# Patient Record
Sex: Male | Born: 1940 | Race: White | Hispanic: No | Marital: Married | Smoking: Former smoker
Health system: Southern US, Community
[De-identification: ages and names within clinical notes are randomized; demographics above are authoritative.]

## PROBLEM LIST (undated history)

## (undated) DIAGNOSIS — G4730 Sleep apnea, unspecified: Secondary | ICD-10-CM

## (undated) DIAGNOSIS — Z87442 Personal history of urinary calculi: Secondary | ICD-10-CM

## (undated) DIAGNOSIS — N400 Enlarged prostate without lower urinary tract symptoms: Secondary | ICD-10-CM

## (undated) DIAGNOSIS — I714 Abdominal aortic aneurysm, without rupture, unspecified: Secondary | ICD-10-CM

## (undated) DIAGNOSIS — I10 Essential (primary) hypertension: Secondary | ICD-10-CM

## (undated) DIAGNOSIS — K7460 Unspecified cirrhosis of liver: Secondary | ICD-10-CM

## (undated) DIAGNOSIS — C4491 Basal cell carcinoma of skin, unspecified: Secondary | ICD-10-CM

## (undated) DIAGNOSIS — Z9981 Dependence on supplemental oxygen: Secondary | ICD-10-CM

## (undated) DIAGNOSIS — Z860100 Personal history of colon polyps, unspecified: Secondary | ICD-10-CM

## (undated) DIAGNOSIS — I6529 Occlusion and stenosis of unspecified carotid artery: Secondary | ICD-10-CM

## (undated) DIAGNOSIS — M4800 Spinal stenosis, site unspecified: Secondary | ICD-10-CM

## (undated) DIAGNOSIS — M519 Unspecified thoracic, thoracolumbar and lumbosacral intervertebral disc disorder: Secondary | ICD-10-CM

## (undated) DIAGNOSIS — E785 Hyperlipidemia, unspecified: Secondary | ICD-10-CM

## (undated) DIAGNOSIS — I4819 Other persistent atrial fibrillation: Secondary | ICD-10-CM

## (undated) DIAGNOSIS — Z8719 Personal history of other diseases of the digestive system: Secondary | ICD-10-CM

## (undated) DIAGNOSIS — M109 Gout, unspecified: Secondary | ICD-10-CM

## (undated) DIAGNOSIS — K219 Gastro-esophageal reflux disease without esophagitis: Secondary | ICD-10-CM

## (undated) DIAGNOSIS — I2510 Atherosclerotic heart disease of native coronary artery without angina pectoris: Secondary | ICD-10-CM

## (undated) DIAGNOSIS — M1990 Unspecified osteoarthritis, unspecified site: Secondary | ICD-10-CM

## (undated) HISTORY — PX: PARS PLANA VITRECTOMY W/ REPAIR OF MACULAR HOLE: SHX2170

## (undated) HISTORY — PX: FRACTURE SURGERY: SHX138

## (undated) HISTORY — PX: LITHOTRIPSY: SUR834

## (undated) HISTORY — PX: TONSILLECTOMY: SUR1361

## (undated) HISTORY — PX: BACK SURGERY: SHX140

## (undated) HISTORY — DX: Abdominal aortic aneurysm, without rupture, unspecified: I71.40

## (undated) HISTORY — PX: COLONOSCOPY: SHX174

## (undated) HISTORY — PX: WRIST SURGERY: SHX841

## (undated) HISTORY — PX: OTHER SURGICAL HISTORY: SHX169

## (undated) HISTORY — PX: EYE SURGERY: SHX253

## (undated) HISTORY — PX: RHINOPLASTY: SUR1284

## (undated) SURGERY — Surgical Case
Anesthesia: *Unknown

---

## 1998-08-10 ENCOUNTER — Encounter: Payer: Self-pay | Admitting: Urology

## 1998-08-10 ENCOUNTER — Ambulatory Visit (HOSPITAL_COMMUNITY): Admission: RE | Admit: 1998-08-10 | Discharge: 1998-08-10 | Payer: Self-pay | Admitting: Urology

## 2001-08-12 ENCOUNTER — Other Ambulatory Visit: Admission: RE | Admit: 2001-08-12 | Discharge: 2001-08-12 | Payer: Self-pay | Admitting: Dermatology

## 2004-07-31 ENCOUNTER — Ambulatory Visit (HOSPITAL_COMMUNITY): Admission: RE | Admit: 2004-07-31 | Discharge: 2004-07-31 | Payer: Self-pay | Admitting: Internal Medicine

## 2005-08-14 ENCOUNTER — Ambulatory Visit (HOSPITAL_COMMUNITY): Admission: RE | Admit: 2005-08-14 | Discharge: 2005-08-14 | Payer: Self-pay | Admitting: Internal Medicine

## 2007-10-11 ENCOUNTER — Ambulatory Visit (HOSPITAL_COMMUNITY): Admission: RE | Admit: 2007-10-11 | Discharge: 2007-10-12 | Payer: Self-pay | Admitting: Neurosurgery

## 2007-10-26 ENCOUNTER — Encounter (HOSPITAL_COMMUNITY): Admission: RE | Admit: 2007-10-26 | Discharge: 2007-11-25 | Payer: Self-pay | Admitting: Neurosurgery

## 2008-03-07 ENCOUNTER — Encounter: Admission: RE | Admit: 2008-03-07 | Discharge: 2008-03-07 | Payer: Self-pay | Admitting: Neurosurgery

## 2009-06-02 HISTORY — PX: JOINT REPLACEMENT: SHX530

## 2009-06-18 ENCOUNTER — Ambulatory Visit (HOSPITAL_COMMUNITY): Admission: RE | Admit: 2009-06-18 | Discharge: 2009-06-18 | Payer: Self-pay | Admitting: Internal Medicine

## 2009-09-10 ENCOUNTER — Inpatient Hospital Stay (HOSPITAL_COMMUNITY): Admission: RE | Admit: 2009-09-10 | Discharge: 2009-09-14 | Payer: Self-pay | Admitting: Orthopedic Surgery

## 2009-09-10 DIAGNOSIS — Z96652 Presence of left artificial knee joint: Secondary | ICD-10-CM | POA: Insufficient documentation

## 2009-10-03 ENCOUNTER — Encounter (HOSPITAL_COMMUNITY): Admission: RE | Admit: 2009-10-03 | Discharge: 2009-11-02 | Payer: Self-pay | Admitting: Orthopedic Surgery

## 2009-11-06 ENCOUNTER — Encounter (HOSPITAL_COMMUNITY)
Admission: RE | Admit: 2009-11-06 | Discharge: 2009-12-06 | Payer: Self-pay | Source: Home / Self Care | Admitting: Orthopedic Surgery

## 2010-06-23 ENCOUNTER — Encounter: Payer: Self-pay | Admitting: Orthopaedic Surgery

## 2010-07-01 ENCOUNTER — Ambulatory Visit (HOSPITAL_COMMUNITY)
Admission: RE | Admit: 2010-07-01 | Discharge: 2010-07-01 | Payer: Self-pay | Source: Home / Self Care | Attending: Internal Medicine | Admitting: Internal Medicine

## 2010-08-20 LAB — PROTIME-INR
INR: 1.43 (ref 0.00–1.49)
Prothrombin Time: 17.3 seconds — ABNORMAL HIGH (ref 11.6–15.2)

## 2010-08-21 LAB — BASIC METABOLIC PANEL
Chloride: 99 mEq/L (ref 96–112)
Creatinine, Ser: 1.25 mg/dL (ref 0.4–1.5)
GFR calc Af Amer: >60 mL/min (ref >60)
GFR calc non Af Amer: >60 mL/min (ref >60)
Glucose, Bld: 121 mg/dL — ABNORMAL HIGH (ref 70–99)
Glucose, Bld: 162 mg/dL — ABNORMAL HIGH (ref 70–99)
Sodium: 138 mEq/L (ref 135–145)

## 2010-08-21 LAB — CBC
HCT: 30.8 % — ABNORMAL LOW (ref 39.0–52.0)
HCT: 35.2 % — ABNORMAL LOW (ref 39.0–52.0)
Hemoglobin: 11.1 g/dL — ABNORMAL LOW (ref 13.0–17.0)
MCHC: 33.7 g/dL (ref 30.0–36.0)
MCHC: 34.3 g/dL (ref 30.0–36.0)
MCV: 94.4 fL (ref 78.0–100.0)
MCV: 94.9 fL (ref 78.0–100.0)
MCV: 95.3 fL (ref 78.0–100.0)
Platelets: 205 10*3/uL (ref 150–400)
Platelets: 215 10*3/uL (ref 150–400)
Platelets: 230 10*3/uL (ref 150–400)
RBC: 3.48 MIL/uL — ABNORMAL LOW (ref 4.22–5.81)
RDW: 13.4 % (ref 11.5–15.5)
RDW: 13.7 % (ref 11.5–15.5)
WBC: 11.2 10*3/uL — ABNORMAL HIGH (ref 4.0–10.5)
WBC: 13.7 10*3/uL — ABNORMAL HIGH (ref 4.0–10.5)

## 2010-08-21 LAB — PROTIME-INR
INR: 0.96 (ref 0.00–1.49)
INR: 0.99 (ref 0.00–1.49)
INR: 1.09 (ref 0.00–1.49)
INR: 1.27 (ref 0.00–1.49)
Prothrombin Time: 12.7 seconds (ref 11.6–15.2)
Prothrombin Time: 13 seconds (ref 11.6–15.2)
Prothrombin Time: 14 seconds (ref 11.6–15.2)

## 2010-08-21 LAB — COMPREHENSIVE METABOLIC PANEL
Albumin: 4 g/dL (ref 3.5–5.2)
Alkaline Phosphatase: 65 U/L (ref 39–117)
CO2: 30 mEq/L (ref 19–32)
Calcium: 8.9 mg/dL (ref 8.4–10.5)
Creatinine, Ser: 1.16 mg/dL (ref 0.4–1.5)
GFR calc non Af Amer: >60 mL/min (ref >60)
Sodium: 140 mEq/L (ref 135–145)
Total Bilirubin: 0.5 mg/dL (ref 0.3–1.2)
Total Protein: 6.7 g/dL (ref 6.0–8.3)

## 2010-08-21 LAB — URINALYSIS, ROUTINE W REFLEX MICROSCOPIC
Hgb urine dipstick: NEGATIVE
Specific Gravity, Urine: 1.014 (ref 1.005–1.030)

## 2010-08-21 LAB — GLUCOSE, CAPILLARY: Glucose-Capillary: 104 mg/dL — ABNORMAL HIGH (ref 70–99)

## 2010-08-21 LAB — TYPE AND SCREEN: ABO/RH(D): A POS

## 2010-08-21 LAB — ABO/RH: ABO/RH(D): A POS

## 2010-08-21 LAB — APTT: aPTT: 27 seconds (ref 24–37)

## 2010-10-15 NOTE — Op Note (Signed)
NAMEHENDERSON, FRAMPTON NO.:  [id number]   MEDICAL RECORD NO.:  [id number]          PATIENT TYPE:  OIB   LOCATION:  3534                         FACILITY:  [facility]   PHYSICIAN:  Donalee Citrin, [name]        DATE OF BIRTH:  Aug 30, 1940   DATE OF PROCEDURE:  10/11/2007  DATE OF DISCHARGE:                               OPERATIVE REPORT   PREOPERATIVE DIAGNOSES:  Large ruptured disk L3-L4 with spinal stenosis  and right-sided L4 radiculopathy.   PROCEDURE:  Lumbar laminectomy and microdiskectomy L3-L4 on the right  with microscopic dissection of the right L4 nerve root microscopic  diskectomy.   SURGEON:  Donalee Citrin, MD   ASSISTANT:  Kathaleen Maser. Pool, MD   ANESTHESIA:  General.   HISTORY OF PRESENT ILLNESS:  The patient is a very pleasant 70 year old  gentleman who has had progressive worsening back, predominantly right  leg pain radiating down through his hip down the back of his leg to the  front of his shin with numbness and tingling in the same distribution.  The patient's MRI scan showed a very large disk herniation, central and  rightward, causing severe spinal stenosis and compression to the right  L4 nerve root.  The patient failed all forms of  conservative treatment  and was recommended laminectomy and microdiskectomy.  The risks and  benefits of the operation were explained to the patient, and he  understands and agreed to proceed forward.   The patient was brought to the OR and induced under anesthesia,  positioned prone on the Wilson frame.  Back was prepped and prepped in  the usual sterile fashion.  The fascia localized the appropriate level.  After infiltration with 5 mL of lidocaine with epinephrine, a midline  incision was made, and Bovie electrocautery was used to take down the  subperiosteal.  Dissection was carried down the lamina at L3-L4 on the  right.  Intraoperative x-ray confirmed the position at the appropriate  level.  Then, using a high-speed  drill, the inferior aspect was the L3  medial facet complex and the superior aspect of L4 was drilled down.  The undersurface of the lamina was bitten away with a 2- and 3-mm  Kerrison punch exposing the ligamentum flavum, which was removed in a  piecemeal fashion exposing the thecal sac.  At this point, the operative  microscope was draped, brought into the field and under microscopic  illumination, thecal sac was dissected off the lateral ligament and  careful and meticulous underbiting of the medial facet complex allowed  me to gain access to the lateral compartment and the disk space in the  proximal L4 nerve root.  The L4 nerve root was densely adherent to the  large disk herniation and compressing it from the ventral side.  This  was attempted to be teased away with a 4 Penfield and blunt nerve hook  over the plane was difficult to develop, so further lateral access to  the disk space was achieved.  A small annulotomy was made with 11 blade  scalpel and using  a nerve hook working on the annular defect, working  underneath the nerve root, a large fragment of the disk was immediately  expressed.  This allowed more further mobilization of the nerve root.  Working from the pedicle superiorly, the L4 nerve root was dissected off  this dense adhesion membrane and several more fragments of the disk were  immediately removed.  Using Epstein curette and pituitary rongeurs, disk  space was directly cleaned out and several more fragments were removed  from the central compartment as well as both inferiorly through the disk  space at the level of the L4 pedicle.  At the end of the diskectomy,  there was no further stenosis at the L4 nerve root or thecal sac  centrally.  It was explored with a coronary dilator and hockey stock and  noted to be widely patent.  The wound was then copiously irrigated.  Meticulous hemostasis was maintained.  Gelfoam was laid on top of the  dura  muscle.  The fascia  was approximated in layers with interrupted Vicryl  and the skin was closed with running 4-0 subcuticular.  Benzoin and  Steri-Strips applied.  The patient went to the recovery room in stable  condition.  At the end of the case, sponge and instrument count was  correct.           ______________________________  Donalee Citrin, [name]     GC/MEDQ  D:  10/11/2007  T:  10/12/2007  Job:  [id number]

## 2011-04-07 DIAGNOSIS — N529 Male erectile dysfunction, unspecified: Secondary | ICD-10-CM | POA: Insufficient documentation

## 2011-04-07 DIAGNOSIS — E291 Testicular hypofunction: Secondary | ICD-10-CM | POA: Insufficient documentation

## 2011-07-01 ENCOUNTER — Other Ambulatory Visit (HOSPITAL_COMMUNITY): Payer: Self-pay | Admitting: Internal Medicine

## 2011-07-04 ENCOUNTER — Ambulatory Visit (HOSPITAL_COMMUNITY)
Admission: RE | Admit: 2011-07-04 | Discharge: 2011-07-04 | Disposition: A | Discharge status: Home / Self Care | Payer: Medicare Other | Source: Ambulatory Visit | Attending: Internal Medicine | Admitting: Internal Medicine

## 2011-07-04 DIAGNOSIS — I6529 Occlusion and stenosis of unspecified carotid artery: Secondary | ICD-10-CM | POA: Insufficient documentation

## 2011-07-04 DIAGNOSIS — I779 Disorder of arteries and arterioles, unspecified: Secondary | ICD-10-CM | POA: Diagnosis not present

## 2011-07-04 DIAGNOSIS — I2510 Atherosclerotic heart disease of native coronary artery without angina pectoris: Secondary | ICD-10-CM | POA: Diagnosis not present

## 2011-07-04 DIAGNOSIS — I259 Chronic ischemic heart disease, unspecified: Secondary | ICD-10-CM | POA: Diagnosis not present

## 2011-07-04 DIAGNOSIS — I10 Essential (primary) hypertension: Secondary | ICD-10-CM | POA: Diagnosis not present

## 2011-07-11 ENCOUNTER — Ambulatory Visit (HOSPITAL_COMMUNITY)
Admission: RE | Admit: 2011-07-11 | Discharge: 2011-07-11 | Disposition: A | Discharge status: Home / Self Care | Payer: Medicare Other | Source: Ambulatory Visit | Attending: Internal Medicine | Admitting: Internal Medicine

## 2011-07-11 ENCOUNTER — Other Ambulatory Visit (HOSPITAL_COMMUNITY): Payer: Self-pay | Admitting: Internal Medicine

## 2011-07-11 DIAGNOSIS — M1610 Unilateral primary osteoarthritis, unspecified hip: Secondary | ICD-10-CM | POA: Diagnosis not present

## 2011-07-11 DIAGNOSIS — I10 Essential (primary) hypertension: Secondary | ICD-10-CM | POA: Diagnosis not present

## 2011-07-11 DIAGNOSIS — M25559 Pain in unspecified hip: Secondary | ICD-10-CM | POA: Diagnosis not present

## 2011-07-11 DIAGNOSIS — R52 Pain, unspecified: Secondary | ICD-10-CM

## 2011-07-11 DIAGNOSIS — M169 Osteoarthritis of hip, unspecified: Secondary | ICD-10-CM | POA: Insufficient documentation

## 2011-09-11 DIAGNOSIS — M169 Osteoarthritis of hip, unspecified: Secondary | ICD-10-CM | POA: Diagnosis not present

## 2011-09-11 DIAGNOSIS — M1710 Unilateral primary osteoarthritis, unspecified knee: Secondary | ICD-10-CM | POA: Diagnosis not present

## 2011-09-21 ENCOUNTER — Other Ambulatory Visit: Payer: Self-pay | Admitting: Orthopedic Surgery

## 2011-09-21 MED ORDER — BUPIVACAINE LIPOSOME 1.3 % IJ SUSP: 20.0000 mL | Freq: Once | INTRAMUSCULAR | Status: DC

## 2011-09-21 MED ORDER — DEXAMETHASONE SODIUM PHOSPHATE 10 MG/ML IJ SOLN: 10.0000 mg | Freq: Once | INTRAMUSCULAR | Status: DC

## 2011-10-15 DIAGNOSIS — I10 Essential (primary) hypertension: Secondary | ICD-10-CM | POA: Diagnosis not present

## 2011-10-15 DIAGNOSIS — M1990 Unspecified osteoarthritis, unspecified site: Secondary | ICD-10-CM | POA: Diagnosis not present

## 2011-11-17 ENCOUNTER — Encounter (HOSPITAL_COMMUNITY): Payer: Self-pay | Admitting: Pharmacy Technician

## 2011-11-26 NOTE — H&P (Signed)
Caleb Wolf DOB: 03/30/1941  Chief Complaint: right hip pain  History of Present Illness The patient is a 71 year old male who comes in today for a preoperative History and Physical. The patient is scheduled for a right total hip arthroplasty to be performed by Dr. Gus Rankin. Aluisio, MD at [facility] on Monday December 08, 2011 . The right hip is the thing that is bothering him the most now. Significant pain in the groin, radiating down the anterior thigh. He is not having lower extremity weakness or paresthesia. It hurts at all times. Worse with activity, but also recurring at rest. He has advanced endstage arthritis of the hip. He is having worsening pain and worsening dysfunction. At this point the most predictable means of getting him better is a total hip arthroplasty.    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 10/31/2010 Sprain/strain, knee/leg NOS (844.9). 11/15/2010 Hypertension Hypercholesterolemia Kidney Stone Cataract Heart Disease Sleep Apnea. not diagnosed Fracture Of Wrist  Allergies Codeine/Codeine Derivatives   Family History Congestive Heart Failure. mother and father Father. deceased age 86 due to MI Mother. deceased age 68 due to MI   Social History Marital status. married Number of flights of stairs before winded. 4-5 Pain Contract. no Exercise. Exercises weekly; does running / walking Illicit drug use. no Living situation. live with spouse Previously in rehab. no Tobacco / smoke exposure. no Tobacco use. former smoker; smoke(d) 1 1/2 pack(s) per day; uses less than half 1/2 can(s) smokeless per week Drug/Alcohol Rehab (Currently). no Alcohol use. current drinker; drinks beer, wine and hard liquor; only occasionally per week Children. 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 living will, healthcare POA   Medication History Lisinopril (40MG  Tablet, Oral) Active. Metoprolol Tartrate (100MG  Tablet, Oral) Active. Finasteride (5MG  Tablet, Oral) Active. Hydrochlorothiazide (25MG  Tablet, Oral) Active. CloNIDine HCl (0.1MG  Tablet, Oral) Active. Simvastatin (10MG  Tablet, Oral) Active. Testim (50MG /5GM Gel, Transdermal) Active. Verapamil HCl ER (180MG  Tablet ER, Oral) Active. Aspirin EC (81MG  Tablet DR, Oral daily) Active.   Past Surgical History Straighten Nasal Septum Total Knee Replacement. left Other Surgery. LEFT WRIST-1995 Spinal Decompression. lower back Tonsillectomy    Review of Systems General:Not Present- Chills, Fever, Night Sweats, Fatigue, Weight Gain, Weight Loss and Memory Loss. Skin:Not Present- Hives, Itching, Rash, Eczema and Lesions. HEENT:Not Present- Tinnitus, Headache, Double Vision, Visual Loss, Hearing Loss and Dentures. Respiratory:Not Present- Shortness of breath with exertion, Shortness of breath at rest, Allergies, Coughing up blood and Chronic Cough. Cardiovascular:Not Present- Chest Pain, Racing/skipping heartbeats, Difficulty Breathing Lying Down, Murmur, Swelling and Palpitations. Gastrointestinal:Not Present- Bloody Stool, Heartburn, Abdominal Pain, Vomiting, Nausea, Constipation, Diarrhea, Difficulty Swallowing, Jaundice and Loss of appetitie. Male Genitourinary:Not Present- Urinary frequency, Blood in Urine, Weak urinary stream, Discharge, Flank Pain, Incontinence, Painful Urination, Urgency, Urinary Retention and Urinating at Night. Musculoskeletal:Present- Joint Pain. Not Present- Muscle Weakness, Muscle Pain, Joint Swelling, Back Pain, Morning Stiffness and Spasms. Neurological:Not Present- Tremor, Dizziness, Blackout spells, Paralysis, Difficulty with balance and Weakness. Psychiatric:Not Present- Insomnia.   Vitals Weight: 280 lb Height: 70  in Body Surface Area: 2.5 m Body Mass Index: 40.18 kg/m Pulse: 72 (Regular) Resp.: 16 (Unlabored) BP: 118/72 (Sitting, Left Arm, Standard)    Physical Exam General Mental Status - Alert, cooperative and good historian. General Appearance- pleasant. Not in acute distress. Orientation- Oriented X3. Build & Nutrition- Well nourished and Well  developed. Head and Neck Head- normocephalic, atraumatic . Neck Global Assessment- supple. no bruit auscultated on the right and no bruit auscultated on the left. Eye Pupil- Bilateral- Regular and Round. Motion- Bilateral- EOMI. Chest and Lung Exam Auscultation: Breath sounds:- clear at anterior chest wall and - clear at posterior chest wall. Adventitious sounds:- No Adventitious sounds. Cardiovascular Auscultation:Rhythm- Regular rate and rhythm. Heart Sounds- S1 WNL and S2 WNL. Murmurs & Other Heart Sounds:Auscultation of the heart reveals - No Murmurs. Abdomen Palpation/Percussion:Tenderness- Abdomen is non-tender to palpation. Rigidity (guarding)- Abdomen is soft. Auscultation:Auscultation of the abdomen reveals - Bowel sounds normal. Male Genitourinary Not done, not pertinent to present illness Peripheral Vascular Upper Extremity: Palpation:- Pulses bilaterally normal. Lower Extremity: Palpation:- Pulses bilaterally normal. Neurologic Examination of related systems reveals - normal muscle strength and tone in all extremities. Neurologic evaluation reveals - normal sensation and upper and lower extremity deep tendon reflexes intact bilaterally . MusculoskeletalThe left hip has normal motion with no discomfort. Right hip flexion 90,. no internal rotation, about 10 external rotation, and 10 abduction. He has significant antalgic gait pattern on the left. His right knee looks great. Range 0 to 130. No swelling, tenderness, or instability.    RADIOGRAPHS: Radiographs taken today AP and  lateral of the left knee shows his prosthesis to be in excellent position with no periprosthetic abnormalities. AP pelvis and lateral of the right hip shows advanced endstage arthritis of the hip. He is bone on bone with large osteophyte formation.  Assessment & Plan Osteoarthritis, Hip (715.35) Right total hip arthroplasty     Dimitri Ped, PA-C

## 2011-12-01 ENCOUNTER — Encounter (HOSPITAL_COMMUNITY)
Admission: RE | Admit: 2011-12-01 | Discharge: 2011-12-01 | Disposition: A | Discharge status: Home / Self Care | Payer: Medicare Other | Source: Ambulatory Visit | Attending: Orthopedic Surgery | Admitting: Orthopedic Surgery

## 2011-12-01 ENCOUNTER — Ambulatory Visit (HOSPITAL_COMMUNITY)
Admission: RE | Admit: 2011-12-01 | Discharge: 2011-12-01 | Disposition: A | Discharge status: Home / Self Care | Payer: Medicare Other | Source: Ambulatory Visit | Attending: Orthopedic Surgery | Admitting: Orthopedic Surgery

## 2011-12-01 ENCOUNTER — Encounter (HOSPITAL_COMMUNITY): Payer: Self-pay

## 2011-12-01 DIAGNOSIS — I517 Cardiomegaly: Secondary | ICD-10-CM | POA: Diagnosis not present

## 2011-12-01 DIAGNOSIS — Z01812 Encounter for preprocedural laboratory examination: Secondary | ICD-10-CM | POA: Diagnosis not present

## 2011-12-01 DIAGNOSIS — I498 Other specified cardiac arrhythmias: Secondary | ICD-10-CM | POA: Diagnosis not present

## 2011-12-01 DIAGNOSIS — Z01818 Encounter for other preprocedural examination: Secondary | ICD-10-CM | POA: Insufficient documentation

## 2011-12-01 DIAGNOSIS — M25559 Pain in unspecified hip: Secondary | ICD-10-CM | POA: Diagnosis not present

## 2011-12-01 DIAGNOSIS — M1610 Unilateral primary osteoarthritis, unspecified hip: Secondary | ICD-10-CM | POA: Insufficient documentation

## 2011-12-01 DIAGNOSIS — Z87891 Personal history of nicotine dependence: Secondary | ICD-10-CM | POA: Insufficient documentation

## 2011-12-01 DIAGNOSIS — M25859 Other specified joint disorders, unspecified hip: Secondary | ICD-10-CM | POA: Diagnosis not present

## 2011-12-01 DIAGNOSIS — M169 Osteoarthritis of hip, unspecified: Secondary | ICD-10-CM | POA: Insufficient documentation

## 2011-12-01 DIAGNOSIS — Z01810 Encounter for preprocedural cardiovascular examination: Secondary | ICD-10-CM | POA: Insufficient documentation

## 2011-12-01 HISTORY — DX: Occlusion and stenosis of unspecified carotid artery: I65.29

## 2011-12-01 HISTORY — DX: Unspecified thoracic, thoracolumbar and lumbosacral intervertebral disc disorder: M51.9

## 2011-12-01 HISTORY — DX: Sleep apnea, unspecified: G47.30

## 2011-12-01 HISTORY — DX: Gastro-esophageal reflux disease without esophagitis: K21.9

## 2011-12-01 HISTORY — DX: Unspecified osteoarthritis, unspecified site: M19.90

## 2011-12-01 HISTORY — DX: Atherosclerotic heart disease of native coronary artery without angina pectoris: I25.10

## 2011-12-01 HISTORY — DX: Hyperlipidemia, unspecified: E78.5

## 2011-12-01 LAB — COMPREHENSIVE METABOLIC PANEL
BUN: 21 mg/dL (ref 6–23)
CO2: 28 mEq/L (ref 19–32)
Chloride: 104 mEq/L (ref 96–112)
Creatinine, Ser: 1.13 mg/dL (ref 0.50–1.35)
GFR calc Af Amer: 75 mL/min — ABNORMAL LOW (ref >90)
GFR calc non Af Amer: 64 mL/min — ABNORMAL LOW (ref >90)
Glucose, Bld: 99 mg/dL (ref 70–99)
Total Bilirubin: 0.3 mg/dL (ref 0.3–1.2)

## 2011-12-01 LAB — URINALYSIS, ROUTINE W REFLEX MICROSCOPIC
Bilirubin Urine: NEGATIVE
Ketones, ur: NEGATIVE mg/dL
Nitrite: NEGATIVE
Urobilinogen, UA: 0.2 mg/dL (ref 0.0–1.0)

## 2011-12-01 LAB — CBC
HCT: 42.1 % (ref 39.0–52.0)
MCV: 93.8 fL (ref 78.0–100.0)
RBC: 4.49 MIL/uL (ref 4.22–5.81)
RDW: 13.6 % (ref 11.5–15.5)
WBC: 8 10*3/uL (ref 4.0–10.5)

## 2011-12-01 LAB — PROTIME-INR
INR: 0.99 (ref 0.00–1.49)
Prothrombin Time: 13.3 seconds (ref 11.6–15.2)

## 2011-12-01 LAB — URINE MICROSCOPIC-ADD ON

## 2011-12-01 NOTE — Patient Instructions (Signed)
20 DEMETRIS CAPELL  12/01/2011   Your procedure is scheduled on:  12/08/11   Monday  Surgery [id number]  Report to [facility] at    1125   AM.  Call this number if you have problems the morning of surgery: [phone]     Or PST   [id number]  [facility]   Remember:   Do not eat food:After Midnight. Sunday NIGHT  May have clear liquids: until 0530 AM   Monday MORNING THEN NONE  Clear liquids include soda, tea, black coffee, apple or grape juice, broth.  Take these medicines the morning of surgery with A SIP OF WATER: CLONIDINE, TOPROL XL, VERAPAMIL   Do not wear jewelry, make-up or nail polish.  Do not wear lotions, powders, or perfumes. You may wear deodorant.  Do not shave 48 hours prior to surgery.  Do not bring valuables to the hospital.  Contacts, dentures or bridgework may not be worn into surgery.  Leave suitcase in the car. After surgery it may be brought to your room.  For patients admitted to the hospital, checkout time is 11:00 AM the day of discharge.   Patients discharged the day of surgery will not be allowed to drive home.  Name and phone number of your driver:  wife                                                                    Special Instructions: CHG Shower Use Special Wash: 1/2 bottle night before surgery and 1/2 bottle morning of surgery. REGULAR SOAP FACE AND PRIVATES               Please read over the following fact sheets that you were given: MRSA Information

## 2011-12-01 NOTE — Pre-Procedure Instructions (Signed)
Notified Lundi at [facility] of abnormal urine and micro for provider to review- states will let provider know

## 2011-12-01 NOTE — Pre-Procedure Instructions (Signed)
Left vm pt cell number requesting call back for verification of pos staph. Home number does not have answering machine

## 2011-12-01 NOTE — Progress Notes (Signed)
12/01/11 1020  OBSTRUCTIVE SLEEP APNEA  Have you ever been diagnosed with sleep apnea through a sleep study? No  Do you snore loudly (loud enough to be heard through closed doors)?  1  Do you often feel tired, fatigued, or sleepy during the daytime? 1  Has anyone observed you stop breathing during your sleep? 1  Do you have, or are you being treated for high blood pressure? 1  BMI more than 35 kg/m2? 1  Age over 69 years old? 1  Neck circumference greater than 40 cm/18 inches? 1  Gender: 1  Obstructive Sleep Apnea Score 8   Score 4 or greater  Updated health history;Results sent to PCP

## 2011-12-01 NOTE — Pre-Procedure Instructions (Signed)
EKG reviewed by Dr Leta Jungling- OK

## 2011-12-02 DIAGNOSIS — E785 Hyperlipidemia, unspecified: Secondary | ICD-10-CM | POA: Diagnosis not present

## 2011-12-03 NOTE — Pre-Procedure Instructions (Signed)
Received fax that no action needed re abnormal labs per Dr Lequita Halt

## 2011-12-08 ENCOUNTER — Inpatient Hospital Stay (HOSPITAL_COMMUNITY): Payer: Medicare Other

## 2011-12-08 ENCOUNTER — Encounter (HOSPITAL_COMMUNITY): Payer: Self-pay | Admitting: Orthopedic Surgery

## 2011-12-08 ENCOUNTER — Encounter (HOSPITAL_COMMUNITY): Payer: Self-pay | Admitting: Anesthesiology

## 2011-12-08 ENCOUNTER — Ambulatory Visit (HOSPITAL_COMMUNITY): Payer: Medicare Other | Admitting: Anesthesiology

## 2011-12-08 ENCOUNTER — Inpatient Hospital Stay (HOSPITAL_COMMUNITY)
Admission: RE | Admit: 2011-12-08 | Discharge: 2011-12-10 | DRG: 470 | Disposition: A | Discharge status: Home Health | Payer: Medicare Other | Source: Ambulatory Visit | Attending: Orthopedic Surgery | Admitting: Orthopedic Surgery

## 2011-12-08 ENCOUNTER — Encounter (HOSPITAL_COMMUNITY)
Admission: RE | Disposition: A | Discharge status: Home Health | Payer: Self-pay | Source: Ambulatory Visit | Attending: Orthopedic Surgery

## 2011-12-08 ENCOUNTER — Encounter (HOSPITAL_COMMUNITY): Payer: Self-pay | Admitting: *Deleted

## 2011-12-08 DIAGNOSIS — I2510 Atherosclerotic heart disease of native coronary artery without angina pectoris: Secondary | ICD-10-CM | POA: Diagnosis present

## 2011-12-08 DIAGNOSIS — I739 Peripheral vascular disease, unspecified: Secondary | ICD-10-CM | POA: Diagnosis not present

## 2011-12-08 DIAGNOSIS — I10 Essential (primary) hypertension: Secondary | ICD-10-CM | POA: Diagnosis present

## 2011-12-08 DIAGNOSIS — Z96649 Presence of unspecified artificial hip joint: Secondary | ICD-10-CM

## 2011-12-08 DIAGNOSIS — Z471 Aftercare following joint replacement surgery: Secondary | ICD-10-CM | POA: Diagnosis not present

## 2011-12-08 DIAGNOSIS — G4730 Sleep apnea, unspecified: Secondary | ICD-10-CM | POA: Diagnosis present

## 2011-12-08 DIAGNOSIS — Z96641 Presence of right artificial hip joint: Secondary | ICD-10-CM | POA: Insufficient documentation

## 2011-12-08 DIAGNOSIS — M169 Osteoarthritis of hip, unspecified: Secondary | ICD-10-CM | POA: Diagnosis present

## 2011-12-08 DIAGNOSIS — M1610 Unilateral primary osteoarthritis, unspecified hip: Secondary | ICD-10-CM | POA: Diagnosis not present

## 2011-12-08 DIAGNOSIS — K219 Gastro-esophageal reflux disease without esophagitis: Secondary | ICD-10-CM | POA: Diagnosis present

## 2011-12-08 DIAGNOSIS — M25559 Pain in unspecified hip: Secondary | ICD-10-CM | POA: Diagnosis not present

## 2011-12-08 HISTORY — PX: TOTAL HIP ARTHROPLASTY: SHX124

## 2011-12-08 LAB — TYPE AND SCREEN
ABO/RH(D): A POS
Antibody Screen: NEGATIVE

## 2011-12-08 SURGERY — ARTHROPLASTY, HIP, TOTAL,POSTERIOR APPROACH
Anesthesia: General | Site: Hip | Laterality: Right | Wound class: Clean

## 2011-12-08 MED ORDER — ACETAMINOPHEN 325 MG PO TABS: 650.0000 mg | ORAL_TABLET | Freq: Four times a day (QID) | ORAL | Status: DC | PRN

## 2011-12-08 MED ORDER — FENTANYL CITRATE 0.05 MG/ML IJ SOLN
INTRAMUSCULAR | Status: DC | PRN
  Administered 2011-12-08: 100 ug via INTRAVENOUS
  Administered 2011-12-08 (×2): 50 ug via INTRAVENOUS
  Administered 2011-12-08: 100 ug via INTRAVENOUS
  Administered 2011-12-08: 50 ug via INTRAVENOUS

## 2011-12-08 MED ORDER — PHENOL 1.4 % MT LIQD: 1.0000 | OROMUCOSAL | Status: DC | PRN

## 2011-12-08 MED ORDER — LIDOCAINE HCL (CARDIAC) 20 MG/ML IV SOLN
INTRAVENOUS | Status: DC | PRN
  Administered 2011-12-08: 80 mg via INTRAVENOUS
  Administered 2011-12-08: 40 mg via INTRAVENOUS

## 2011-12-08 MED ORDER — LACTATED RINGERS IV SOLN
INTRAVENOUS | Status: DC | PRN
  Administered 2011-12-08 (×3): via INTRAVENOUS

## 2011-12-08 MED ORDER — ACETAMINOPHEN 10 MG/ML IV SOLN
INTRAVENOUS | Status: AC
  Filled 2011-12-08: qty 100

## 2011-12-08 MED ORDER — HYDROMORPHONE HCL PF 1 MG/ML IJ SOLN
INTRAMUSCULAR | Status: AC
  Filled 2011-12-08: qty 1

## 2011-12-08 MED ORDER — SUCCINYLCHOLINE CHLORIDE 20 MG/ML IJ SOLN
INTRAMUSCULAR | Status: DC | PRN
  Administered 2011-12-08: 120 mg via INTRAVENOUS

## 2011-12-08 MED ORDER — LACTATED RINGERS IV SOLN: INTRAVENOUS | Status: DC

## 2011-12-08 MED ORDER — SODIUM CHLORIDE 0.9 % IJ SOLN
INTRAMUSCULAR | Status: DC | PRN
  Administered 2011-12-08: 50 mL

## 2011-12-08 MED ORDER — ACETAMINOPHEN 10 MG/ML IV SOLN
1000.0000 mg | Freq: Once | INTRAVENOUS | Status: AC
  Administered 2011-12-08: 1000 mg via INTRAVENOUS

## 2011-12-08 MED ORDER — OXYCODONE HCL 5 MG PO TABS
5.0000 mg | ORAL_TABLET | ORAL | Status: DC | PRN
  Administered 2011-12-08 – 2011-12-10 (×8): 10 mg via ORAL
  Filled 2011-12-08 (×7): qty 2
  Filled 2011-12-08: qty 1
  Filled 2011-12-08: qty 2

## 2011-12-08 MED ORDER — POLYETHYLENE GLYCOL 3350 17 G PO PACK: 17.0000 g | PACK | Freq: Every day | ORAL | Status: DC | PRN

## 2011-12-08 MED ORDER — ONDANSETRON HCL 4 MG PO TABS: 4.0000 mg | ORAL_TABLET | Freq: Four times a day (QID) | ORAL | Status: DC | PRN

## 2011-12-08 MED ORDER — SIMVASTATIN 10 MG PO TABS
10.0000 mg | ORAL_TABLET | Freq: Every day | ORAL | Status: DC
  Administered 2011-12-08 – 2011-12-09 (×2): 10 mg via ORAL
  Filled 2011-12-08 (×3): qty 1

## 2011-12-08 MED ORDER — CEFAZOLIN SODIUM-DEXTROSE 2-3 GM-% IV SOLR
2.0000 g | Freq: Four times a day (QID) | INTRAVENOUS | Status: AC
  Administered 2011-12-08 – 2011-12-09 (×2): 2 g via INTRAVENOUS
  Filled 2011-12-08 (×2): qty 50

## 2011-12-08 MED ORDER — MENTHOL 3 MG MT LOZG
1.0000 | LOZENGE | OROMUCOSAL | Status: DC | PRN
  Filled 2011-12-08 (×2): qty 9

## 2011-12-08 MED ORDER — METOPROLOL SUCCINATE ER 50 MG PO TB24
50.0000 mg | ORAL_TABLET | Freq: Two times a day (BID) | ORAL | Status: DC
  Administered 2011-12-08: 50 mg via ORAL
  Filled 2011-12-08 (×3): qty 1

## 2011-12-08 MED ORDER — VERAPAMIL HCL ER 180 MG PO TBCR
180.0000 mg | EXTENDED_RELEASE_TABLET | Freq: Two times a day (BID) | ORAL | Status: DC
  Administered 2011-12-08 – 2011-12-10 (×4): 180 mg via ORAL
  Filled 2011-12-08 (×5): qty 1

## 2011-12-08 MED ORDER — DEXAMETHASONE SODIUM PHOSPHATE 10 MG/ML IJ SOLN
INTRAMUSCULAR | Status: DC | PRN
  Administered 2011-12-08: 10 mg via INTRAVENOUS

## 2011-12-08 MED ORDER — PROMETHAZINE HCL 25 MG/ML IJ SOLN
INTRAMUSCULAR | Status: AC
  Filled 2011-12-08: qty 1

## 2011-12-08 MED ORDER — ACETAMINOPHEN 650 MG RE SUPP: 650.0000 mg | Freq: Four times a day (QID) | RECTAL | Status: DC | PRN

## 2011-12-08 MED ORDER — PROMETHAZINE HCL 25 MG/ML IJ SOLN
6.2500 mg | INTRAMUSCULAR | Status: DC | PRN
  Administered 2011-12-08: 6.25 mg via INTRAVENOUS

## 2011-12-08 MED ORDER — CEFAZOLIN SODIUM 1-5 GM-% IV SOLN
INTRAVENOUS | Status: AC
  Filled 2011-12-08: qty 50

## 2011-12-08 MED ORDER — GLYCOPYRROLATE 0.2 MG/ML IJ SOLN
INTRAMUSCULAR | Status: DC | PRN
  Administered 2011-12-08: 0.4 mg via INTRAVENOUS

## 2011-12-08 MED ORDER — METHOCARBAMOL 100 MG/ML IJ SOLN
500.0000 mg | Freq: Four times a day (QID) | INTRAVENOUS | Status: DC | PRN
  Administered 2011-12-08: 500 mg via INTRAVENOUS
  Filled 2011-12-08: qty 5

## 2011-12-08 MED ORDER — NEOSTIGMINE METHYLSULFATE 1 MG/ML IJ SOLN
INTRAMUSCULAR | Status: DC | PRN
  Administered 2011-12-08: 3 mg via INTRAVENOUS

## 2011-12-08 MED ORDER — FINASTERIDE 5 MG PO TABS
5.0000 mg | ORAL_TABLET | Freq: Every day | ORAL | Status: DC
  Administered 2011-12-08 – 2011-12-09 (×2): 5 mg via ORAL
  Filled 2011-12-08 (×3): qty 1

## 2011-12-08 MED ORDER — SODIUM CHLORIDE 0.9 % IV SOLN: INTRAVENOUS | Status: DC

## 2011-12-08 MED ORDER — RIVAROXABAN 10 MG PO TABS
10.0000 mg | ORAL_TABLET | Freq: Every day | ORAL | Status: DC
  Administered 2011-12-09 – 2011-12-10 (×2): 10 mg via ORAL
  Filled 2011-12-08 (×4): qty 1

## 2011-12-08 MED ORDER — BISACODYL 10 MG RE SUPP: 10.0000 mg | Freq: Every day | RECTAL | Status: DC | PRN

## 2011-12-08 MED ORDER — DEXTROSE-NACL 5-0.45 % IV SOLN
INTRAVENOUS | Status: DC
  Administered 2011-12-08 – 2011-12-09 (×2): via INTRAVENOUS

## 2011-12-08 MED ORDER — DOCUSATE SODIUM 100 MG PO CAPS
100.0000 mg | ORAL_CAPSULE | Freq: Two times a day (BID) | ORAL | Status: DC
  Administered 2011-12-08 – 2011-12-10 (×4): 100 mg via ORAL

## 2011-12-08 MED ORDER — PROPOFOL 10 MG/ML IV EMUL
INTRAVENOUS | Status: DC | PRN
  Administered 2011-12-08: 150 mg via INTRAVENOUS

## 2011-12-08 MED ORDER — METOCLOPRAMIDE HCL 10 MG PO TABS: 5.0000 mg | ORAL_TABLET | Freq: Three times a day (TID) | ORAL | Status: DC | PRN

## 2011-12-08 MED ORDER — ROCURONIUM BROMIDE 100 MG/10ML IV SOLN
INTRAVENOUS | Status: DC | PRN
  Administered 2011-12-08: 30 mg via INTRAVENOUS

## 2011-12-08 MED ORDER — FLEET ENEMA 7-19 GM/118ML RE ENEM: 1.0000 | ENEMA | Freq: Once | RECTAL | Status: AC | PRN

## 2011-12-08 MED ORDER — DEXTROSE 5 % IV SOLN
3.0000 g | INTRAVENOUS | Status: AC
  Administered 2011-12-08: 3 g via INTRAVENOUS

## 2011-12-08 MED ORDER — MIDAZOLAM HCL 5 MG/5ML IJ SOLN
INTRAMUSCULAR | Status: DC | PRN
  Administered 2011-12-08 (×2): 1 mg via INTRAVENOUS

## 2011-12-08 MED ORDER — ONDANSETRON HCL 4 MG/2ML IJ SOLN
INTRAMUSCULAR | Status: DC | PRN
  Administered 2011-12-08: 4 mg via INTRAVENOUS

## 2011-12-08 MED ORDER — BUPIVACAINE LIPOSOME 1.3 % IJ SUSP
20.0000 mL | INTRAMUSCULAR | Status: AC
  Administered 2011-12-08: 20 mL
  Filled 2011-12-08: qty 20

## 2011-12-08 MED ORDER — LACTATED RINGERS IV SOLN
INTRAVENOUS | Status: DC
  Administered 2011-12-08: 1000 mL via INTRAVENOUS

## 2011-12-08 MED ORDER — DIPHENHYDRAMINE HCL 12.5 MG/5ML PO ELIX: 12.5000 mg | ORAL_SOLUTION | ORAL | Status: DC | PRN

## 2011-12-08 MED ORDER — METOCLOPRAMIDE HCL 5 MG/ML IJ SOLN: 5.0000 mg | Freq: Three times a day (TID) | INTRAMUSCULAR | Status: DC | PRN

## 2011-12-08 MED ORDER — ACETAMINOPHEN 10 MG/ML IV SOLN
1000.0000 mg | Freq: Four times a day (QID) | INTRAVENOUS | Status: AC
  Administered 2011-12-08 – 2011-12-09 (×4): 1000 mg via INTRAVENOUS
  Filled 2011-12-08 (×5): qty 100

## 2011-12-08 MED ORDER — TESTOSTERONE 50 MG/5GM (1%) TD GEL: 5.0000 g | Freq: Every day | TRANSDERMAL | Status: DC

## 2011-12-08 MED ORDER — CLONIDINE HCL 0.1 MG PO TABS
0.1000 mg | ORAL_TABLET | Freq: Two times a day (BID) | ORAL | Status: DC
  Administered 2011-12-08 – 2011-12-10 (×4): 0.1 mg via ORAL
  Filled 2011-12-08 (×5): qty 1

## 2011-12-08 MED ORDER — METHOCARBAMOL 500 MG PO TABS
500.0000 mg | ORAL_TABLET | Freq: Four times a day (QID) | ORAL | Status: DC | PRN
  Administered 2011-12-09 (×2): 500 mg via ORAL
  Filled 2011-12-08 (×2): qty 1

## 2011-12-08 MED ORDER — HYDROMORPHONE HCL PF 1 MG/ML IJ SOLN
0.2500 mg | INTRAMUSCULAR | Status: DC | PRN
  Administered 2011-12-08 (×3): 0.5 mg via INTRAVENOUS

## 2011-12-08 MED ORDER — TRAMADOL HCL 50 MG PO TABS: 50.0000 mg | ORAL_TABLET | Freq: Four times a day (QID) | ORAL | Status: DC | PRN

## 2011-12-08 MED ORDER — ONDANSETRON HCL 4 MG/2ML IJ SOLN: 4.0000 mg | Freq: Four times a day (QID) | INTRAMUSCULAR | Status: DC | PRN

## 2011-12-08 MED ORDER — MORPHINE SULFATE 2 MG/ML IJ SOLN: 1.0000 mg | INTRAMUSCULAR | Status: DC | PRN

## 2011-12-08 MED ORDER — CEFAZOLIN SODIUM-DEXTROSE 2-3 GM-% IV SOLR
INTRAVENOUS | Status: AC
  Filled 2011-12-08: qty 50

## 2011-12-08 MED ORDER — HYDROCHLOROTHIAZIDE 25 MG PO TABS
25.0000 mg | ORAL_TABLET | Freq: Every day | ORAL | Status: DC
  Administered 2011-12-09 – 2011-12-10 (×2): 25 mg via ORAL
  Filled 2011-12-08 (×4): qty 1

## 2011-12-08 SURGICAL SUPPLY — 50 items
BAG ZIPLOCK 12X15 (MISCELLANEOUS) ×2 IMPLANT
BIT DRILL 2.8X128 (BIT) ×2 IMPLANT
BLADE EXTENDED COATED 6.5IN (ELECTRODE) ×2 IMPLANT
BLADE SAW SAG 73X25 THK (BLADE) ×1
BLADE SAW SGTL 73X25 THK (BLADE) ×1 IMPLANT
CLOTH BEACON ORANGE TIMEOUT ST (SAFETY) ×2 IMPLANT
DECANTER SPIKE VIAL GLASS SM (MISCELLANEOUS) ×2 IMPLANT
DRAPE INCISE IOBAN 66X45 STRL (DRAPES) ×2 IMPLANT
DRAPE ORTHO SPLIT 77X108 STRL (DRAPES) ×2
DRAPE POUCH INSTRU U-SHP 10X18 (DRAPES) ×2 IMPLANT
DRAPE SURG ORHT 6 SPLT 77X108 (DRAPES) ×2 IMPLANT
DRAPE U-SHAPE 47X51 STRL (DRAPES) ×2 IMPLANT
DRSG ADAPTIC 3X8 NADH LF (GAUZE/BANDAGES/DRESSINGS) ×2 IMPLANT
DRSG MEPILEX BORDER 4X4 (GAUZE/BANDAGES/DRESSINGS) ×2 IMPLANT
DRSG MEPILEX BORDER 4X8 (GAUZE/BANDAGES/DRESSINGS) ×2 IMPLANT
DURAPREP 26ML APPLICATOR (WOUND CARE) ×2 IMPLANT
ELECT REM PT RETURN 9FT ADLT (ELECTROSURGICAL) ×2
ELECTRODE REM PT RTRN 9FT ADLT (ELECTROSURGICAL) ×1 IMPLANT
EVACUATOR 1/8 PVC DRAIN (DRAIN) ×2 IMPLANT
FACESHIELD LNG OPTICON STERILE (SAFETY) ×8 IMPLANT
GLOVE BIO SURGEON STRL SZ7.5 (GLOVE) ×2 IMPLANT
GLOVE BIO SURGEON STRL SZ8 (GLOVE) ×2 IMPLANT
GLOVE BIOGEL PI IND STRL 8 (GLOVE) ×2 IMPLANT
GLOVE BIOGEL PI INDICATOR 8 (GLOVE) ×2
GOWN STRL NON-REIN LRG LVL3 (GOWN DISPOSABLE) ×2 IMPLANT
GOWN STRL REIN XL XLG (GOWN DISPOSABLE) ×2 IMPLANT
IMMOBILIZER KNEE 20 (SOFTGOODS) ×2
IMMOBILIZER KNEE 20 THIGH 36 (SOFTGOODS) ×1 IMPLANT
KIT BASIN OR (CUSTOM PROCEDURE TRAY) ×2 IMPLANT
MANIFOLD NEPTUNE II (INSTRUMENTS) ×2 IMPLANT
NDL SAFETY ECLIPSE 18X1.5 (NEEDLE) ×1 IMPLANT
NEEDLE HYPO 18GX1.5 SHARP (NEEDLE) ×1
NS IRRIG 1000ML POUR BTL (IV SOLUTION) ×2 IMPLANT
PACK TOTAL JOINT (CUSTOM PROCEDURE TRAY) ×2 IMPLANT
PASSER SUT SWANSON 36MM LOOP (INSTRUMENTS) ×2 IMPLANT
POSITIONER SURGICAL ARM (MISCELLANEOUS) ×2 IMPLANT
SPONGE GAUZE 4X4 12PLY (GAUZE/BANDAGES/DRESSINGS) ×2 IMPLANT
STRIP CLOSURE SKIN 1/2X4 (GAUZE/BANDAGES/DRESSINGS) ×4 IMPLANT
SUT ETHIBOND NAB CT1 #1 30IN (SUTURE) ×4 IMPLANT
SUT MNCRL AB 4-0 PS2 18 (SUTURE) ×2 IMPLANT
SUT VIC AB 1 CT1 27 (SUTURE) ×2
SUT VIC AB 1 CT1 27XBRD ANTBC (SUTURE) ×2 IMPLANT
SUT VIC AB 2-0 CT1 27 (SUTURE) ×3
SUT VIC AB 2-0 CT1 TAPERPNT 27 (SUTURE) ×3 IMPLANT
SUT VLOC 180 0 24IN GS25 (SUTURE) ×4 IMPLANT
SYR 50ML LL SCALE MARK (SYRINGE) ×2 IMPLANT
TOWEL OR 17X26 10 PK STRL BLUE (TOWEL DISPOSABLE) ×4 IMPLANT
TOWEL OR NON WOVEN STRL DISP B (DISPOSABLE) ×2 IMPLANT
TRAY FOLEY CATH 14FRSI W/METER (CATHETERS) ×2 IMPLANT
WATER STERILE IRR 1500ML POUR (IV SOLUTION) ×2 IMPLANT

## 2011-12-08 NOTE — Anesthesia Postprocedure Evaluation (Signed)
Anesthesia Post Note  Patient: Caleb Wolf  Procedure(s) Performed: Procedure(s) (LRB): TOTAL HIP ARTHROPLASTY (Right)  Anesthesia type: General  Patient location: PACU  Post pain: Pain level controlled  Post assessment: Post-op Vital signs reviewed  Last Vitals:  Filed Vitals:   12/08/11 1645  BP: 149/81  Pulse: 65  Temp:   Resp: 13    Post vital signs: Reviewed  Level of consciousness: sedated  Complications: No apparent anesthesia complications

## 2011-12-08 NOTE — Preoperative (Signed)
Beta Blockers   Reason not to administer Beta Blockers:Not Applicable 

## 2011-12-08 NOTE — Transfer of Care (Signed)
Immediate Anesthesia Transfer of Care Note  Patient: Caleb Wolf  Procedure(s) Performed: Procedure(s) (LRB): TOTAL HIP ARTHROPLASTY (Right)  Patient Location: PACU  Anesthesia Type: General  Level of Consciousness: awake, alert , oriented, patient cooperative and responds to stimulation  Airway & Oxygen Therapy: Patient Spontanous Breathing and Patient connected to face mask oxygen  Post-op Assessment: Report given to PACU RN and Post -op Vital signs reviewed and stable  Post vital signs: stable  Complications: No apparent anesthesia complications

## 2011-12-08 NOTE — Anesthesia Preprocedure Evaluation (Addendum)
Anesthesia Evaluation  Patient identified by MRN, date of birth, ID band Patient awake    Reviewed: Allergy & Precautions, H&P , NPO status , Patient's Chart, lab work & pertinent test results  History of Anesthesia Complications (+) PONV  Airway Mallampati: II TM Distance: >3 FB Neck ROM: Full    Dental  (+) Teeth Intact and Dental Advisory Given   Pulmonary sleep apnea , former smoker,  breath sounds clear to auscultation  Pulmonary exam normal       Cardiovascular hypertension, Pt. on medications and Pt. on home beta blockers + CAD and + Peripheral Vascular Disease Rhythm:Regular Rate:Normal     Neuro/Psych negative neurological ROS  negative psych ROS   GI/Hepatic Neg liver ROS, GERD-  Medicated,  Endo/Other  Morbid obesity  Renal/GU negative Renal ROS  negative genitourinary   Musculoskeletal negative musculoskeletal ROS (+)   Abdominal (+) + obese,   Peds  Hematology negative hematology ROS (+)   Anesthesia Other Findings   Reproductive/Obstetrics negative OB ROS                          Anesthesia Physical Anesthesia Plan  ASA: III  Anesthesia Plan: General   Post-op Pain Management:    Induction: Intravenous  Airway Management Planned: Oral ETT  Additional Equipment:   Intra-op Plan:   Post-operative Plan: Extubation in OR  Informed Consent: I have reviewed the patients History and Physical, chart, labs and discussed the procedure including the risks, benefits and alternatives for the proposed anesthesia with the patient or authorized representative who has indicated his/her understanding and acceptance.   Dental advisory given  Plan Discussed with: CRNA  Anesthesia Plan Comments:         Anesthesia Quick Evaluation

## 2011-12-08 NOTE — Op Note (Signed)
Pre-operative diagnosis- Osteoarthritis Right hip  Post-operative diagnosis- Osteoarthritis  Right hip  Procedure-  RightTotal Hip Arthroplasty  Surgeon- Gus Rankin. Kimber Esterly, MD  Assistant- Avel Peace, PA-C   Anesthesia  General  EBL- 250   Drain Hemovac   Complication- None  Condition-PACU - hemodynamically stable.   Brief Clinical Note- Caleb Wolf is a 71 y.o. male with end stage arthritis of his right hip with progressively worsening pain and dysfunction. Pain occurs with activity and rest including pain at night. He has tried analgesics, protected weight bearing and rest without benefit. Pain is too severe to attempt physical therapy. Radiographs demonstrate bone on bone arthritis with subchondral cyst formation. He presents now for right THA.  Procedure in detail-   The patient is brought into the operating room and placed on the operating table. After successful administration of General  anesthesia, the patient is placed in the  Left lateral decubitus position with the  Right side up and held in place with the hip positioner. The lower extremity is isolated from the perineum with plastic drapes and time-out is performed by the surgical team. The lower extremity is then prepped and draped in the usual sterile fashion. A short posterolateral incision is made with a ten blade through the subcutaneous tissue to the level of the fascia lata which is incised in line with the skin incision. The sciatic nerve is palpated and protected and the short external rotators and capsule are isolated from the femur. The hip is then dislocated and the center of the femoral head is marked. A trial prosthesis is placed such that the trial head corresponds to the center of the patients' native femoral head. The resection level is marked on the femoral neck and the resection is made with an oscillating saw. The femoral head is removed and femoral retractors placed to gain access to the femoral canal.  The canal finder is passed into the femoral canal and the canal is thoroughly irrigated with sterile saline to remove the fatty contents. Axial reaming is performed to 19.5  mm, proximal reaming to 45F  and the sleeve machined to a large. A 24 F large trial sleeve is placed into the proximal femur.      The femur is then retracted anteriorly to gain acetabular exposure. Acetabular retractors are placed and the labrum and osteophytes are removed, Acetabular reaming is performed to 55  mm and a 56  mm Pinnacle acetabular shell is placed in anatomic position with excellent purchase. Additional dome screws were not needed. The permanent 36 mm neutral + 4 Marathon liner is placed into the acetabular shell.      The trial femur is then placed into the femoral canal. The size is 24 x 19  stem with a 36 + 12  neck and a 36 + 0 head with the neck version matching  the patients' native anteversion. The hip is reduced with excellent stability with full extension and full external rotation, 70 degrees flexion with 40 degrees adduction and 90 degrees internal rotation and 90 degrees of flexion with 70 degrees of internal rotation. The operative leg is placed on top of the non-operative leg and the leg lengths are found to be equal. The trials are then removed and the permanent implant of the same size is impacted into the femoral canal. The ceramic femoral head of the same size as the trial is placed and the hip is reduced with the same stability parameters. The operative leg is again  placed on top of the non-operative leg and the leg lengths are found to be equal.      The wound is then copiously irrigated with saline solution and the capsule and short external rotators are re-attached to the femur through drill holes with Ethibond suture. The fascia lata is closed over a hemovac drain with #1 vicryl suture and the fascia lata, gluteal muscles and subcutaneous tissues are injected with Exparel 20ml diluted with saline 50ml.  The subcutaneous tissues are closed with #1 and2-0 vicryl and the subcuticular layer closed with running 4-0 Monocryl. The drain is hooked to suction, incision cleaned and dried, and steri-srips and a bulky sterile dressing applied. The limb is placed into a knee immobilizer and the patient is awakened and transported to recovery in stable condition.      Please note that a surgical assistant was a medical necessity for this procedure in order to perform it in a safe and expeditious manner. The assistant was necessary to provide retraction to the vital neurovascular structures and to retract and position the limb to allow for anatomic placement of the prosthetic components.  Gus Rankin Derrin Currey, MD    12/08/2011, 3:39 PM

## 2011-12-08 NOTE — Interval H&P Note (Signed)
History and Physical Interval Note:  12/08/2011 2:17 PM  Caleb Wolf  has presented today for surgery, with the diagnosis of osteoarthritis right hip  The various methods of treatment have been discussed with the patient and family. After consideration of risks, benefits and other options for treatment, the patient has consented to  Procedure(s) (LRB): TOTAL HIP ARTHROPLASTY (Right) as a surgical intervention .  The patient's history has been reviewed, patient examined, no change in status, stable for surgery.  I have reviewed the patients' chart and labs.  Questions were answered to the patient's satisfaction.     Loanne Drilling

## 2011-12-09 LAB — CBC
HCT: 34.8 % — ABNORMAL LOW (ref 39.0–52.0)
MCH: 32.2 pg (ref 26.0–34.0)
MCHC: 35.1 g/dL (ref 30.0–36.0)
MCV: 91.8 fL (ref 78.0–100.0)
RDW: 13.4 % (ref 11.5–15.5)

## 2011-12-09 LAB — BASIC METABOLIC PANEL
BUN: 17 mg/dL (ref 6–23)
Creatinine, Ser: 1.13 mg/dL (ref 0.50–1.35)
GFR calc Af Amer: 75 mL/min — ABNORMAL LOW (ref >90)
GFR calc non Af Amer: 64 mL/min — ABNORMAL LOW (ref >90)
Glucose, Bld: 157 mg/dL — ABNORMAL HIGH (ref 70–99)

## 2011-12-09 MED ORDER — METOPROLOL TARTRATE 50 MG PO TABS
50.0000 mg | ORAL_TABLET | Freq: Two times a day (BID) | ORAL | Status: DC
  Administered 2011-12-09 – 2011-12-10 (×2): 50 mg via ORAL
  Filled 2011-12-09 (×5): qty 1

## 2011-12-09 NOTE — Evaluation (Signed)
Physical Therapy Evaluation Patient Details Name: Caleb Wolf MRN: [id number] DOB: 1941/02/08 Today's Date: 12/09/2011 Time: 1000-1028 PT Time Calculation (min): 28 min  PT Assessment / Plan / Recommendation Clinical Impression  71 yo male s/p R THA. Mobilizing fairly well. Plans to d/c home with wife. Anticipate pt will progress well.     PT Assessment  Patient needs continued PT services    Follow Up Recommendations  Home health PT    Barriers to Discharge        Equipment Recommendations  None recommended by PT    Recommendations for Other Services OT consult   Frequency 7X/week    Precautions / Restrictions Precautions Precautions: Fall;Posterior Hip Precaution Comments: Reviewed posterior hip precations Restrictions Weight Bearing Restrictions: No RLE Weight Bearing: Weight bearing as tolerated   Pertinent Vitals/Pain       Mobility  Bed Mobility Bed Mobility: Supine to Sit Supine to Sit: HOB elevated;With rails;3: Mod assist Details for Bed Mobility Assistance: Assist for R LE off bed and trunk to upright. Increased time. VCs safety, technique, hand placement.  Transfers Transfers: Sit to Stand;Stand to Sit Sit to Stand: From bed;From elevated surface;With upper extremity assist;3: Mod assist Stand to Sit: 4: Min assist;To chair/3-in-1;With upper extremity assist Details for Transfer Assistance: VCs safety, technique, hand placement. Assist to rise, stabilzie, control descent.  Ambulation/Gait Ambulation/Gait Assistance: 4: Min assist Ambulation Distance (Feet): 35 Feet Assistive device: Rolling walker Ambulation/Gait Assistance Details: VCs safety, technique, sequence, step length. Slow gait speed. Fatigues fairly easily.  Gait Pattern: Step-to pattern;Decreased stride length;Decreased step length - right;Decreased step length - left;Antalgic    Exercises     PT Diagnosis: Difficulty walking;Abnormality of gait;Acute pain  PT Problem List: Decreased  strength;Decreased activity tolerance;Decreased mobility;Pain;Decreased knowledge of precautions;Decreased knowledge of use of DME PT Treatment Interventions: DME instruction;Gait training;Stair training;Functional mobility training;Therapeutic activities;Therapeutic exercise;Patient/family education   PT Goals Acute Rehab PT Goals PT Goal Formulation: With patient/family Time For Goal Achievement: 12/16/11 Potential to Achieve Goals: Good Pt will go Supine/Side to Sit: with supervision PT Goal: Supine/Side to Sit - Progress: Goal set today Pt will go Sit to Supine/Side: with supervision PT Goal: Sit to Supine/Side - Progress: Goal set today Pt will go Sit to Stand: with supervision PT Goal: Sit to Stand - Progress: Goal set today Pt will Ambulate: 51 - 150 feet;with supervision;with least restrictive assistive device PT Goal: Ambulate - Progress: Goal set today Pt will Go Up / Down Stairs: 1-2 stairs;with supervision;with least restrictive assistive device (1 step) PT Goal: Up/Down Stairs - Progress: Goal set today  Visit Information  Last PT Received On: 12/09/11 Assistance Needed: +2 (safety)    Subjective Data  Subjective: "I'm fine now, but I bet it's gonna hurt after you're through with me" Patient Stated Goal: Home with wife   Prior Functioning  Home Living Lives With: Spouse Available Help at Discharge: Family Type of Home: House Home Access: Stairs to enter [employer] of Steps: 1 (small, threshold) Entrance Stairs-Rails: None Home Layout: One level Bathroom Toilet: Handicapped height Home Adaptive Equipment: Walker - rolling;Bedside commode/3-in-1;Shower chair without back Prior Function Level of Independence: Independent Able to Take Stairs?: Yes Communication Communication: No difficulties    Cognition  Overall Cognitive Status: Appears within functional limits for tasks assessed/performed Arousal/Alertness: Awake/alert Orientation Level: Appears  intact for tasks assessed Behavior During Session: [facility] for tasks performed    Extremity/Trunk Assessment Right Lower Extremity Assessment RLE ROM/Strength/Tone: Deficits RLE ROM/Strength/Tone Deficits: Knee ext  3/5, hip flex 2/5, moves ankle well RLE Sensation: WFL - Light Touch Left Lower Extremity Assessment LLE ROM/Strength/Tone: WFL for tasks assessed LLE Sensation: WFL - Light Touch   Balance    End of Session PT - End of Session Equipment Utilized During Treatment: Gait belt Activity Tolerance: Patient tolerated treatment well Patient left: in chair;with call bell/phone within reach;with family/visitor present  GP     Rebeca Alert [facility] 12/09/2011, 10:59 AM [phone]

## 2011-12-09 NOTE — Progress Notes (Signed)
Physical Therapy Treatment Patient Details Name: TAMAR LIPSCOMB MRN: [id number] DOB: [birth year] Today's Date: 12/09/2011 Time: 6213-0865 PT Time Calculation (min): 25 min  PT Assessment / Plan / Recommendation Comments on Treatment Session  Progressing well. Planning for d/c home tomorrow.     Follow Up Recommendations  Home health PT    Barriers to Discharge        Equipment Recommendations  None recommended by PT    Recommendations for Other Services OT consult  Frequency 7X/week   Plan Discharge plan remains appropriate    Precautions / Restrictions Precautions Precautions: Posterior Hip;Fall Restrictions Weight Bearing Restrictions: No RLE Weight Bearing: Weight bearing as tolerated   Pertinent Vitals/Pain     Mobility  Bed Mobility Bed Mobility: Sit to Supine Sit to Supine: 4: Min assist Details for Bed Mobility Assistance: Assist for R LE onto bed. VCs safety, technique.  Transfers Transfers: Sit to Stand;Stand to Sit Sit to Stand: 4: Min assist;With upper extremity assist;From chair/3-in-1 Stand to Sit: To toilet;4: Min assist;With upper extremity assist Details for Transfer Assistance: VCs safety, technique, hand placement. Assist to rise, stabilize, control descent.  Ambulation/Gait Ambulation/Gait Assistance: 4: Min assist Ambulation Distance (Feet): 75 Feet Assistive device: Rolling walker Ambulation/Gait Assistance Details: VCs safety, sequence, step length. Slow gait speed. Assist to stabilize throughout ambulation.  Gait Pattern: Step-to pattern;Decreased stride length;Decreased step length - right;Decreased step length - left;Antalgic    Exercises Total Joint Exercises Ankle Circles/Pumps: AROM;Both;10 reps;Supine Quad Sets: AROM;Both;10 reps;Supine;Strengthening Short Arc Quad: AROM;Right;10 reps;Supine;Strengthening Heel Slides: AROM;Right;10 reps;Supine;Strengthening Hip ABduction/ADduction: AROM;Right;10 reps;Supine;Strengthening   PT Diagnosis:      PT Problem List:   PT Treatment Interventions:     PT Goals Acute Rehab PT Goals PT Goal: Sit to Supine/Side - Progress: Progressing toward goal PT Goal: Sit to Stand - Progress: Progressing toward goal PT Goal: Ambulate - Progress: Progressing toward goal  Visit Information  Last PT Received On: 12/09/11 Assistance Needed: +1    Subjective Data  Subjective: "I'm hoping to go home tomorrow" Patient Stated Goal: Home tomorrow   Cognition  Overall Cognitive Status: Appears within functional limits for tasks assessed/performed Arousal/Alertness: Awake/alert Orientation Level: Appears intact for tasks assessed Behavior During Session: [facility] for tasks performed    Balance     End of Session PT - End of Session Equipment Utilized During Treatment: Gait belt Activity Tolerance: Patient tolerated treatment well Patient left: in bed;with call bell/phone within reach   GP     Rebeca Alert [facility] 12/09/2011, 3:55 PM [phone]

## 2011-12-09 NOTE — Progress Notes (Signed)
Utilization review completed.  

## 2011-12-09 NOTE — Progress Notes (Signed)
   Subjective: 1 Day Post-Op Procedure(s) (LRB): TOTAL HIP ARTHROPLASTY (Right) Patient reports pain as mild.   Patient seen in rounds with Dr. Lequita Halt.  Wife in room on rounds. Patient is well, and has had no acute complaints or problems We will start therapy today.  Plan is to go Home after hospital stay.  Objective: Vital signs in last 24 hours: Temp:  [97.6 F (36.4 C)-98.9 F (37.2 C)] 97.6 F (36.4 C) (07/09 0600) Pulse Rate:  [48-71] 56  (07/09 0600) Resp:  [10-18] 13  (07/09 0600) BP: (126-165)/(52-81) 127/69 mmHg (07/09 0600) SpO2:  [95 %-100 %] 98 % (07/09 0600) Weight:  [123.832 kg (273 lb)] 123.832 kg (273 lb) (07/08 1740)  Intake/Output from previous day:  Intake/Output Summary (Last 24 hours) at 12/09/11 0843 Last data filed at 12/09/11 0600  Gross per 24 hour  Intake 4953.33 ml  Output   2955 ml  Net 1998.33 ml    Intake/Output this shift: UOP 2250  Labs:  Basename 12/09/11 0425  HGB 12.2*    Basename 12/09/11 0425  WBC 12.4*  RBC 3.79*  HCT 34.8*  PLT 189    Basename 12/09/11 0425  NA 137  K 4.6  CL 104  CO2 24  BUN 17  CREATININE 1.13  GLUCOSE 157*  CALCIUM 8.4   No results found for this basename: LABPT:2,INR:2 in the last 72 hours  EXAM General - Patient is Alert, Appropriate and Oriented Extremity - Neurovascular intact Sensation intact distally Dorsiflexion/Plantar flexion intact Dressing - dressing C/D/I Motor Function - intact, moving foot and toes well on exam.  Hemovac pulled without difficulty.  Past Medical History  Diagnosis Date  . PONV (postoperative nausea and vomiting) PRN meds ordered.  . Coronary artery disease Toprol 50 mg BID- continue.  Marland Kitchen GERD (gastroesophageal reflux disease)   . Arthritis   . Hyperlipidemia Zocor  . Sleep apnea     STOP BANG SCORE 8  . Carotid stenosis Monitor for hypotension - parameters on BP meds.    07/04/11- 50-70%bilaterally- stable per Dr Ouida Sills note  . Chronic kidney disease  Lisinpril on hold postop, BUN/Creat OK postop 17/1.13    stones, BPH  . Lower leg edema HCTZ  . Lumbar disc disease   . Hypertension Clonidine (parameters), HCTZ, Lisinopril (hold)_, Verapamil (parameters)    clearance with OV notes Dr Ouida Sills on chart, EKG from 5/12 on chart f    Assessment/Plan: 1 Day Post-Op Procedure(s) (LRB): TOTAL HIP ARTHROPLASTY (Right) Principal Problem:  *OA (osteoarthritis) of hip   Advance diet Up with therapy Continue foley due to strict I&O and urinary output monitoring Discharge home with home health  DVT Prophylaxis - Xarelto, Home ASA on hold for now Weight Bearing As Tolerated right Leg D/C Knee Immobilizer Hemovac Pulled Begin Therapy Hip Preacutions Keep foley until tomorrow. No vaccines.  Caleb Wolf 12/09/2011, 8:43 AM

## 2011-12-10 ENCOUNTER — Encounter (HOSPITAL_COMMUNITY): Payer: Self-pay | Admitting: Orthopedic Surgery

## 2011-12-10 LAB — BASIC METABOLIC PANEL
CO2: 26 mEq/L (ref 19–32)
Chloride: 102 mEq/L (ref 96–112)
Creatinine, Ser: 1.11 mg/dL (ref 0.50–1.35)
GFR calc Af Amer: 76 mL/min — ABNORMAL LOW (ref >90)
Potassium: 4 mEq/L (ref 3.5–5.1)

## 2011-12-10 LAB — CBC
MCV: 93.3 fL (ref 78.0–100.0)
Platelets: 188 10*3/uL (ref 150–400)
RBC: 3.6 MIL/uL — ABNORMAL LOW (ref 4.22–5.81)
RDW: 13.7 % (ref 11.5–15.5)
WBC: 13.2 10*3/uL — ABNORMAL HIGH (ref 4.0–10.5)

## 2011-12-10 MED ORDER — METHOCARBAMOL 500 MG PO TABS: 500.0000 mg | ORAL_TABLET | Freq: Four times a day (QID) | ORAL | Status: AC | PRN

## 2011-12-10 MED ORDER — RIVAROXABAN 10 MG PO TABS: 10.0000 mg | ORAL_TABLET | Freq: Every day | ORAL | Status: DC

## 2011-12-10 MED ORDER — OXYCODONE HCL 5 MG PO TABS: 5.0000 mg | ORAL_TABLET | ORAL | Status: AC | PRN

## 2011-12-10 NOTE — Discharge Summary (Signed)
Physician Discharge Summary   Patient ID: Caleb Wolf MRN: [id number] DOB/AGE: 69-Mar-1942 69 y.o.  Admit date: 12/08/2011 Discharge date: 12/10/2011  Primary Diagnosis: Osteoarthritis Right Hip  Admission Diagnoses:  Past Medical History  Diagnosis Date  . PONV (postoperative nausea and vomiting)   . Coronary artery disease   . GERD (gastroesophageal reflux disease)   . Arthritis   . Hyperlipidemia   . Sleep apnea     STOP BANG SCORE 8  . Carotid stenosis     07/04/11- 50-70%bilaterally- stable per Dr Ouida Sills note  . Chronic kidney disease     stones, BPH  . Lower leg edema   . Lumbar disc disease   . Hypertension     clearance with OV notes Dr Ouida Sills on chart, EKG from 5/12 on chart f   Discharge Diagnoses:   Principal Problem:  *OA (osteoarthritis) of hip  Procedure: Procedure(s) (LRB): TOTAL HIP ARTHROPLASTY (Right)   Consults: None  HPI: Caleb Wolf is a 71 y.o. male with end stage arthritis of his right hip with progressively worsening pain and dysfunction. Pain occurs with activity and rest including pain at night. He has tried analgesics, protected weight bearing and rest without benefit. Pain is too severe to attempt physical therapy. Radiographs demonstrate bone on bone arthritis with subchondral cyst formation. He presents now for right THA.  Laboratory Data: Hospital Outpatient Visit on 12/01/2011  Component Date Value Range Status  . aPTT 12/01/2011 28  24 - 37 seconds Final  . WBC 12/01/2011 8.0  4.0 - 10.5 K/uL Final  . RBC 12/01/2011 4.49  4.22 - 5.81 MIL/uL Final  . Hemoglobin 12/01/2011 13.5  13.0 - 17.0 g/dL Final  . HCT 40/98/1191 42.1  39.0 - 52.0 % Final  . MCV 12/01/2011 93.8  78.0 - 100.0 fL Final  . MCH 12/01/2011 30.1  26.0 - 34.0 pg Final  . MCHC 12/01/2011 32.1  30.0 - 36.0 g/dL Final  . RDW 47/82/9562 13.6  11.5 - 15.5 % Final  . Platelets 12/01/2011 233  150 - 400 K/uL Final  . Sodium 12/01/2011 141  135 - 145 mEq/L Final  . Potassium  12/01/2011 4.8  3.5 - 5.1 mEq/L Final  . Chloride 12/01/2011 104  96 - 112 mEq/L Final  . CO2 12/01/2011 28  19 - 32 mEq/L Final  . Glucose, Bld 12/01/2011 99  70 - 99 mg/dL Final  . BUN 13/12/6576 21  6 - 23 mg/dL Final  . Creatinine, Ser 12/01/2011 1.13  0.50 - 1.35 mg/dL Final  . Calcium 46/96/2952 9.5  8.4 - 10.5 mg/dL Final  . Total Protein 12/01/2011 6.7  6.0 - 8.3 g/dL Final  . Albumin 84/13/2440 4.1  3.5 - 5.2 g/dL Final  . AST 03/29/2535 13  0 - 37 U/L Final  . ALT 12/01/2011 12  0 - 53 U/L Final  . Alkaline Phosphatase 12/01/2011 75  39 - 117 U/L Final  . Total Bilirubin 12/01/2011 0.3  0.3 - 1.2 mg/dL Final  . GFR calc non Af Amer 12/01/2011 64* >90 mL/min Final  . GFR calc Af Amer 12/01/2011 75* >90 mL/min Final   Comment:                                 The eGFR has been calculated  using the CKD EPI equation.                          This calculation has not been                          validated in all clinical                          situations.                          eGFR's persistently                          <90 mL/min signify                          possible Chronic Kidney Disease.  Marland Kitchen Prothrombin Time 12/01/2011 13.3  11.6 - 15.2 seconds Final  . INR 12/01/2011 0.99  0.00 - 1.49 Final  . Color, Urine 12/01/2011 YELLOW  YELLOW Final  . APPearance 12/01/2011 CLEAR  CLEAR Final  . Specific Gravity, Urine 12/01/2011 1.020  1.005 - 1.030 Final  . pH 12/01/2011 5.5  5.0 - 8.0 Final  . Glucose, UA 12/01/2011 NEGATIVE  NEGATIVE mg/dL Final  . Hgb urine dipstick 12/01/2011 NEGATIVE  NEGATIVE Final  . Bilirubin Urine 12/01/2011 NEGATIVE  NEGATIVE Final  . Ketones, ur 12/01/2011 NEGATIVE  NEGATIVE mg/dL Final  . Protein, ur 16/03/9603 NEGATIVE  NEGATIVE mg/dL Final  . Urobilinogen, UA 12/01/2011 0.2  0.0 - 1.0 mg/dL Final  . Nitrite 54/01/8118 NEGATIVE  NEGATIVE Final  . Leukocytes, UA 12/01/2011 TRACE* NEGATIVE Final  . MRSA, PCR  12/01/2011 NEGATIVE  NEGATIVE Final  . Staphylococcus aureus 12/01/2011 POSITIVE* NEGATIVE Final   Comment:                                 The Xpert SA Assay (FDA                          approved for NASAL specimens                          only), is one component of                          a comprehensive surveillance                          program.  It is not intended                          to diagnose infection nor to                          guide or monitor treatment.  . Squamous Epithelial / LPF 12/01/2011 RARE  RARE Final  . WBC, UA 12/01/2011 0-2  <3 WBC/hpf Final  . Bacteria, UA 12/01/2011 RARE  RARE Final    Basename 12/10/11 0419 12/09/11 0425  HGB 10.9* 12.2*    Basename 12/10/11 0419 12/09/11 0425  WBC 13.2*  12.4*  RBC 3.60* 3.79*  HCT 33.6* 34.8*  PLT 188 189    Basename 12/10/11 0419 12/09/11 0425  NA 137 137  K 4.0 4.6  CL 102 104  CO2 26 24  BUN 17 17  CREATININE 1.11 1.13  GLUCOSE 112* 157*  CALCIUM 8.4 8.4   No results found for this basename: LABPT:2,INR:2 in the last 72 hours  X-Rays:Dg Chest 2 View  12/01/2011  *RADIOLOGY REPORT*  Clinical Data: Preop for hip replacement.  No chest complaints.  Ex- smoker.  CHEST - 2 VIEW  Comparison: 09/04/2009  Findings: Moderate mid thoracic spondylosis. Midline trachea.  Mild cardiomegaly.  Stable borderline prominence of the right paratracheal soft tissues over prior exams back to 2009.  Likely due to great vessels. No pleural effusion or pneumothorax.  No congestive failure.  Clear lungs.  IMPRESSION: Borderline cardiomegaly, without acute disease.  Original Report Authenticated By: Consuello Bossier, [name]   Dg Hip Complete Right  12/01/2011  *RADIOLOGY REPORT*  Clinical Data: Right hip pain.  RIGHT HIP - COMPLETE 2+ VIEW  Comparison: 07/11/2011  Findings: There is severe osteoarthritis of the right hip with joint space narrowing and cystic degenerative changes of the acetabulum and femoral head.  Osteophytes  have formed on the acetabulum and femoral head as well.  No significant degenerative changes of the left hip.  Osseous structures of the remainder of the pelvis are normal.  IMPRESSION: Severe osteoarthritis of the right hip.  Cystic changes have slightly progressed since the prior study.  Original Report Authenticated By: Gwynn Burly, [name]   Dg Pelvis Portable  12/08/2011  *RADIOLOGY REPORT*  Clinical Data: Postop right hip.  PORTABLE PELVIS,PORTABLE RIGHT HIP - 1 VIEW  Comparison: None.  Findings: Post total right hip replacement which appears in satisfactory position on this single projection without complication noted.  Drain is in place.  IMPRESSION: Satisfactory position total right hip replacement.  Original Report Authenticated By: Fuller Canada, [name]   Dg Hip Portable 1 View Right  12/08/2011  *RADIOLOGY REPORT*  Clinical Data: Postop right hip.  PORTABLE PELVIS,PORTABLE RIGHT HIP - 1 VIEW  Comparison: None.  Findings: Post total right hip replacement which appears in satisfactory position on this single projection without complication noted.  Drain is in place.  IMPRESSION: Satisfactory position total right hip replacement.  Original Report Authenticated By: Fuller Canada, [name]    EKG: Orders placed during the hospital encounter of 12/01/11  . EKG 12-LEAD  . EKG 12-LEAD     Hospital Course: Patient was admitted to [facility] and taken to the OR and underwent the above state procedure without complications.  Patient tolerated the procedure well and was later transferred to the recovery room and then to the orthopaedic floor for postoperative care.  They were given PO and IV analgesics for pain control following their surgery.  They were given 24 hours of postoperative antibiotics and started on DVT prophylaxis in the form of Xarelto.   PT and OT were ordered for total hip protocol.  The patient was allowed to be WBAT with therapy. Discharge planning was consulted to help with  postop disposition and equipment needs.  Patient had a good night on the evening of surgery and started to get up OOB with therapy on day one.  Hemovac drain was pulled without difficulty.  The knee immobilizer was removed and discontinued.  Continued to work with therapy into day two and did well with both sessions.  Dressing  was changed on day two and the incision was healing well.  Patient was seen in rounds again later that afternoon and was ready to go home.  Discharge Medications: Prior to Admission medications   Medication Sig Start Date End Date Taking? Authorizing Provider  cloNIDine (CATAPRES) 0.1 MG tablet Take 0.1 mg by mouth 2 (two) times daily.   Yes Historical Provider, MD  finasteride (PROSCAR) 5 MG tablet Take 5 mg by mouth at bedtime.    Yes Historical Provider, MD  hydrochlorothiazide (HYDRODIURIL) 25 MG tablet Take 25 mg by mouth daily with breakfast.   Yes Historical Provider, MD  lisinopril (PRINIVIL,ZESTRIL) 40 MG tablet Take 40 mg by mouth at bedtime.    Yes Historical Provider, MD  metoprolol (LOPRESSOR) 100 MG tablet Take 50 mg by mouth 2 (two) times daily.   Yes Historical Provider, MD  simvastatin (ZOCOR) 10 MG tablet Take 10 mg by mouth at bedtime.   Yes Historical Provider, MD  verapamil (CALAN-SR) 180 MG CR tablet Take 180 mg by mouth 2 (two) times daily.   Yes Historical Provider, MD  methocarbamol (ROBAXIN) 500 MG tablet Take 1 tablet (500 mg total) by mouth every 6 (six) hours as needed. 12/10/11 12/20/11  Gerline Ratto, PA  oxyCODONE (OXY IR/ROXICODONE) 5 MG immediate release tablet Take 1-2 tablets (5-10 mg total) by mouth every 4 (four) hours as needed for pain. 12/10/11 12/20/11  Kamea Dacosta Julien Girt, PA  rivaroxaban (XARELTO) 10 MG TABS tablet Take 1 tablet (10 mg total) by mouth daily with breakfast. Take Xarelto for two and a half more weeks, then discontinue Xarelto. Once the patient has completed the Xarelto, they may resume the 81 mg Aspirin. 12/10/11    Sean Malinowski Julien Girt, PA    Diet: Cardiac diet Activity:WBAT No bending hip over 90 degrees- A "L" Angle Do not cross legs Do not let foot roll inward When turning these patients a pillow should be placed between the patient's legs to prevent crossing. Patients should have the affected knee fully extended when trying to sit or stand from all surfaces to prevent excessive hip flexion. When ambulating and turning toward the affected side the affected leg should have the toes turned out prior to moving the walker and the rest of patient's body as to prevent internal rotation/ turning in of the leg. Abduction pillows are the most effective way to prevent a patient from not crossing legs or turning toes in at rest. If an abduction pillow is not ordered placing a regular pillow length wise between the patient's legs is also an effective reminder. It is imperative that these precautions be maintained so that the surgical hip does not dislocate. Follow-up:in 2 weeks Disposition - Home Discharged Condition: good   Discharge Orders    Future Orders Please Complete By Expires   Diet - low sodium heart healthy      Call MD / Call 911      Comments:   If you experience chest pain or shortness of breath, CALL 911 and be transported to the hospital emergency room.  If you develope a fever above 101 F, pus (white drainage) or increased drainage or redness at the wound, or calf pain, call your surgeon's office.   Discharge instructions      Comments:   Pick up stool softner and laxative for home. Do not submerge incision under water. May shower. Continue to use ice for pain and swelling from surgery. Hip Precautions  Take Xarelto for two and a half more  weeks, then discontinue Xarelto. Once the patient has completed the Xarelto, they may resume the 81 mg Aspirin.   Constipation Prevention      Comments:   Drink plenty of fluids.  Prune juice may be helpful.  You may use a stool softener, such as  Colace (over the counter) 100 mg twice a day.  Use MiraLax (over the counter) for constipation as needed.   Increase activity slowly as tolerated      Patient may shower      Comments:   You may shower without a dressing once there is no drainage.  Do not wash over the wound.  If drainage remains, do not shower until drainage stops.   Driving restrictions      Comments:   No driving until released by the physician.   Lifting restrictions      Comments:   No lifting until released by the physician.   Do not sit on low chairs, stoools or toilet seats, as it may be difficult to get up from low surfaces      Follow the hip precautions as taught in Physical Therapy      Change dressing      Comments:   You may change your dressing dressing daily with sterile 4 x 4 inch gauze dressing and paper tape.  Do not submerge the incision under water.   TED hose      Comments:   Use stockings (TED hose) for 3 weeks on both leg(s).  You may remove them at night for sleeping.     Medication List  As of 12/10/2011  5:56 PM   STOP taking these medications         aspirin 81 MG chewable tablet      naproxen sodium 220 MG tablet      testosterone 50 MG/5GM Gel         TAKE these medications         cloNIDine 0.1 MG tablet   Commonly known as: CATAPRES   Take 0.1 mg by mouth 2 (two) times daily.      finasteride 5 MG tablet   Commonly known as: PROSCAR   Take 5 mg by mouth at bedtime.      hydrochlorothiazide 25 MG tablet   Commonly known as: HYDRODIURIL   Take 25 mg by mouth daily with breakfast.      lisinopril 40 MG tablet   Commonly known as: PRINIVIL,ZESTRIL   Take 40 mg by mouth at bedtime.      methocarbamol 500 MG tablet   Commonly known as: ROBAXIN   Take 1 tablet (500 mg total) by mouth every 6 (six) hours as needed.      metoprolol 100 MG tablet   Commonly known as: LOPRESSOR   Take 50 mg by mouth 2 (two) times daily.      oxyCODONE 5 MG immediate release tablet    Commonly known as: Oxy IR/ROXICODONE   Take 1-2 tablets (5-10 mg total) by mouth every 4 (four) hours as needed for pain.      rivaroxaban 10 MG Tabs tablet   Commonly known as: XARELTO   Take 1 tablet (10 mg total) by mouth daily with breakfast. Take Xarelto for two and a half more weeks, then discontinue Xarelto.  Once the patient has completed the Xarelto, they may resume the 81 mg Aspirin.      simvastatin 10 MG tablet   Commonly known as: ZOCOR   Take 10  mg by mouth at bedtime.      verapamil 180 MG CR tablet   Commonly known as: CALAN-SR   Take 180 mg by mouth 2 (two) times daily.           Follow-up Information    Follow up with Loanne Drilling, MD. Schedule an appointment as soon as possible for a visit in 2 weeks.   Contact information:   [facility] [address] [place] [address] [phone]          Signed: Patrica Duel 12/10/2011, 5:56 PM

## 2011-12-10 NOTE — Evaluation (Signed)
Occupational Therapy Evaluation Patient Details Name: Caleb Wolf MRN: [id number] DOB: [birth year] Today's Date: 12/10/2011 Time: 0825-0910 OT Time Calculation (min): 45 min  OT Assessment / Plan / Recommendation Clinical Impression  Pt admitted for R THR with deficits listed below.  Pt would benefit from cont OT to continue education about hip precautions in regards to adls. Pt would benefit from cont HHOT at d/c.    OT Assessment  Patient needs continued OT Services    Follow Up Recommendations  Home health OT    Barriers to Discharge None    Equipment Recommendations  None recommended by OT    Recommendations for Other Services    Frequency  Min 2X/week    Precautions / Restrictions Precautions Precautions: Posterior Hip;Fall Precaution Comments: Reviewed posterior hip precations Restrictions Weight Bearing Restrictions: No RLE Weight Bearing: Weight bearing as tolerated   Pertinent Vitals/Pain Pt c/o very little pain.      ADL  Eating/Feeding: Performed;Independent Where Assessed - Eating/Feeding: Chair Grooming: Performed;Set up Where Assessed - Grooming: Unsupported standing Upper Body Bathing: Simulated;Set up Where Assessed - Upper Body Bathing: Unsupported sitting Lower Body Bathing: Simulated;Minimal assistance Where Assessed - Lower Body Bathing: Supported sit to stand Upper Body Dressing: Performed;Set up Where Assessed - Upper Body Dressing: Unsupported sitting Lower Body Dressing: Performed;Minimal assistance Where Assessed - Lower Body Dressing: Supported sit to stand Toilet Transfer: Performed;Min guard Statistician Method: Surveyor, minerals: Materials engineer and Hygiene: Performed;Min guard Where Assessed - Engineer, mining and Hygiene: Standing Equipment Used: Rolling walker Transfers/Ambulation Related to ADLs: Pt walked with overall S.  Pt requires cues for hip precautions.   Gets confused about 90 degree bending precauation. 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  He knows how to use it but most likely wife will assist him with adls.    OT Diagnosis: Generalized weakness  OT Problem List: Decreased knowledge of use of DME or AE;Decreased knowledge of precautions OT Treatment Interventions: Self-care/ADL training;DME and/or AE instruction   OT Goals Acute Rehab OT Goals OT Goal Formulation: With patient/family Time For Goal Achievement: 12/24/11 Potential to Achieve Goals: Good ADL Goals Pt Will Perform Grooming: with modified independence;Standing at sink ADL Goal: Grooming - Progress: Goal set today Pt Will Perform Lower Body Bathing: with supervision;Sit to stand in shower (w long sponge) ADL Goal: Lower Body Bathing - Progress: Goal set today Pt Will Perform Lower Body Dressing: with modified independence;Sit to stand from chair;with adaptive equipment ADL Goal: Lower Body Dressing - Progress: Goal set today Pt Will Perform Tub/Shower Transfer: Shower transfer;with supervision ADL Goal: Tub/Shower Transfer - Progress: Goal set today Additional ADL Goal #1: Pt will complete all aspects of toileting on handicap height commode with S. ADL Goal: Additional Goal #1 - Progress: Goal set today  Visit Information  Last OT Received On: 12/10/11 Assistance Needed: +1    Subjective Data  Subjective: "I am leaving today." Patient Stated Goal: go home   Prior Functioning  Home Living Lives With: Spouse Available Help at Discharge: Family Type of Home: House Home Access: Stairs to enter [employer] of Steps: 1 (small, threshold) Entrance Stairs-Rails: None Home Layout: One level Bathroom Shower/Tub: Walk-in shower;Door Bathroom Toilet: Handicapped height Bathroom Accessibility: No Home Adaptive Equipment: Walker - rolling;Bedside commode/3-in-1;Shower chair without back Prior Function Level of  Independence: Independent Able to Take Stairs?: Yes Driving: Yes Vocation: Retired Musician: No difficulties Dominant Hand:  Right    Cognition  Overall Cognitive Status: Appears within functional limits for tasks assessed/performed Arousal/Alertness: Awake/alert Orientation Level: Appears intact for tasks assessed Behavior During Session: [facility] for tasks performed Cognition - Other Comments: intact    Extremity/Trunk Assessment Right Upper Extremity Assessment RUE ROM/Strength/Tone: Within functional levels RUE Sensation: WFL - Light Touch RUE Coordination: WFL - gross/fine motor Left Upper Extremity Assessment LUE ROM/Strength/Tone: Within functional levels LUE Sensation: WFL - Light Touch LUE Coordination: WFL - gross/fine motor   Mobility Bed Mobility Bed Mobility: Supine to Sit Supine to Sit: HOB elevated;5: Supervision (Hob at 30 degrees) Details for Bed Mobility Assistance: VCs only to get to side of bed.  No rails. Transfers Transfers: Sit to Stand;Stand to Sit Sit to Stand: 5: Supervision;From bed Stand to Sit: To chair/3-in-1;5: Supervision;Other (comment) (cues to reach back and maintain hip precautions.) Details for Transfer Assistance: VCs for hip precautions, hand placement.   Exercise    Balance Balance Balance Assessed: Yes Static Standing Balance Static Standing - Balance Support: Bilateral upper extremity supported;During functional activity Static Standing - Level of Assistance: 5: Stand by assistance Static Standing - Comment/# of Minutes: 8  End of Session OT - End of Session Activity Tolerance: Patient tolerated treatment well Patient left: in chair;with call bell/phone within reach;with family/visitor present Nurse Communication: Mobility status  GO     Hope Budds 12/10/2011, 10:15 AM [phone]

## 2011-12-10 NOTE — Progress Notes (Signed)
Physical Therapy Treatment Patient Details Name: Caleb Wolf MRN: [id number] DOB: 20-Mar-1941 Today's Date: 12/10/2011 Time: 0865-7846 PT Time Calculation (min): 27 min  PT Assessment / Plan / Recommendation Comments on Treatment Session  DCing home today. Reviewed hip precautions and car transfer. Discussed stair negotiation. All education completed.    Follow Up Recommendations  Home health PT    Barriers to Discharge        Equipment Recommendations  None recommended by PT    Recommendations for Other Services    Frequency 7X/week   Plan Discharge plan remains appropriate    Precautions / Restrictions Precautions Precautions: Posterior Hip;Fall Precaution Comments: Reviewed posterior hip precations Restrictions Weight Bearing Restrictions: No RLE Weight Bearing: Weight bearing as tolerated   Pertinent Vitals/Pain     Mobility  Bed Mobility Bed Mobility: Not assessed Supine to Sit: HOB elevated;5: Supervision (Hob at 30 degrees) Details for Bed Mobility Assistance: VCs only to get to side of bed.  No rails. Transfers Transfers: Sit to Stand;Stand to Sit Sit to Stand: 4: Min guard Stand to Sit: 4: Min guard Details for Transfer Assistance: VCs safety, technique, hand placement. Recommended to pt and wife that if pt feels lightheaded to just stand staticall for a few minutes then proceed with ambulation. Ambulation/Gait Ambulation/Gait Assistance: 4: Min guard Ambulation Distance (Feet): 125 Feet Assistive device: Rolling walker Ambulation/Gait Assistance Details: VCs safety, sequence, step length. Slow gait speed, but steady.  Gait Pattern: Step-to pattern;Decreased stride length    Exercises     PT Diagnosis:    PT Problem List:   PT Treatment Interventions:     PT Goals Acute Rehab PT Goals PT Goal: Sit to Stand - Progress: Progressing toward goal PT Goal: Ambulate - Progress: Progressing toward goal PT Goal: Up/Down Stairs - Progress: Discontinued  (comment) (only small threshold step-verbalized technique)  Visit Information  Last PT Received On: 12/10/11 Assistance Needed: +1    Subjective Data  Subjective: "Im ready to go" Patient Stated Goal: Home   Cognition  Overall Cognitive Status: Appears within functional limits for tasks assessed/performed Arousal/Alertness: Awake/alert Orientation Level: Appears intact for tasks assessed Behavior During Session: [facility] for tasks performed Cognition - Other Comments: intact    Balance  Balance Balance Assessed: Yes Static Standing Balance Static Standing - Balance Support: Bilateral upper extremity supported;During functional activity Static Standing - Level of Assistance: 5: Stand by assistance Static Standing - Comment/# of Minutes: 8  End of Session PT - End of Session Equipment Utilized During Treatment: Gait belt Activity Tolerance: Patient tolerated treatment well Patient left: in chair;with call bell/phone within reach;with family/visitor present   GP     [name] 12/10/2011, 10:30 AM [phone]

## 2011-12-10 NOTE — Progress Notes (Signed)
Physical Therapy Treatment Patient Details Name: RAYDAN SCHLABACH MRN: [id number] DOB: Jun 02, 1941 Today's Date: 12/10/2011 Time: 4098-1191 PT Time Calculation (min): 30 min  PT Assessment / Plan / Recommendation Comments on Treatment Session  Pt still awaiting d/c home. Completed all education this a.m.Notified by RN at 1545 that MD wants 2nd session. Pt continues to perform well. Pt/wife with no further questions/concerns.     Follow Up Recommendations  Home health PT    Barriers to Discharge        Equipment Recommendations  None recommended by PT    Recommendations for Other Services    Frequency 7X/week   Plan Equipment recommendations need to be updated    Precautions / Restrictions Precautions Precautions: Posterior Hip;Fall Restrictions Weight Bearing Restrictions: No RLE Weight Bearing: Weight bearing as tolerated   Pertinent Vitals/Pain     Mobility  Bed Mobility Bed Mobility: Not assessed Transfers Transfers: Sit to Stand;Stand to Sit Sit to Stand: 4: Min assist;With upper extremity assist;From chair/3-in-1 Stand to Sit: 4: Min guard;To chair/3-in-1;With armrests Details for Transfer Assistance: VCs safety, technique, hand placement. assist to rise, stabililze.  Ambulation/Gait Ambulation/Gait Assistance: 4: Min guard Ambulation Distance (Feet): 150 Feet Assistive device: Rolling walker Ambulation/Gait Assistance Details: VCs safety, step length, distance from RW. Slow gait speed. Encouraged pt to use step-to gait pattern for more stability.  Gait Pattern: Step-to pattern;Decreased stride length    Exercises Total Joint Exercises Ankle Circles/Pumps: AROM;Both;10 reps;Supine Quad Sets: AROM;Both;10 reps;Strengthening;Supine Short Arc Quad: AROM;Right;10 reps;Supine Heel Slides: AROM;Right;10 reps;Supine Hip ABduction/ADduction: AAROM;Right;10 reps;Supine   PT Diagnosis:    PT Problem List:   PT Treatment Interventions:     PT Goals Acute Rehab PT  Goals PT Goal: Sit to Stand - Progress: Progressing toward goal PT Goal: Ambulate - Progress: Progressing toward goal  Visit Information  Last PT Received On: 12/10/11 Assistance Needed: +1    Subjective Data  Subjective: "i'm working on a 3rd day here it looks like" Patient Stated Goal: Home   Cognition  Overall Cognitive Status: Appears within functional limits for tasks assessed/performed Arousal/Alertness: Awake/alert Orientation Level: Appears intact for tasks assessed Behavior During Session: [facility] for tasks performed    Balance     End of Session PT - End of Session Equipment Utilized During Treatment: Gait belt Activity Tolerance: Patient tolerated treatment well Patient left: in chair;with call bell/phone within reach;with family/visitor present   GP     Rebeca Alert [facility] 12/10/2011, 4:31 PM [phone]

## 2011-12-10 NOTE — Progress Notes (Addendum)
   Subjective: 2 Days Post-Op Procedure(s) (LRB): TOTAL HIP ARTHROPLASTY (Right) Patient reports pain as mild.   Patient seen in rounds with Dr. Lequita Halt. Patient is well, and has had no acute complaints or problems Plan is to go Home after hospital stay. Possible home later today if does well.  Objective: Vital signs in last 24 hours: Temp:  [97.7 F (36.5 C)-99.4 F (37.4 C)] 99.4 F (37.4 C) (07/10 0508) Pulse Rate:  [54-61] 55  (07/10 0508) Resp:  [13-14] 14  (07/10 0508) BP: (106-135)/(60-69) 135/69 mmHg (07/10 0508) SpO2:  [94 %-100 %] 95 % (07/10 0508)  Intake/Output from previous day:  Intake/Output Summary (Last 24 hours) at 12/10/11 1018 Last data filed at 12/10/11 0508  Gross per 24 hour  Intake   1100 ml  Output   4150 ml  Net  -3050 ml    Intake/Output this shift:    Labs:  Basename 12/10/11 0419 12/09/11 0425  HGB 10.9* 12.2*    Basename 12/10/11 0419 12/09/11 0425  WBC 13.2* 12.4*  RBC 3.60* 3.79*  HCT 33.6* 34.8*  PLT 188 189    Basename 12/10/11 0419 12/09/11 0425  NA 137 137  K 4.0 4.6  CL 102 104  CO2 26 24  BUN 17 17  CREATININE 1.11 1.13  GLUCOSE 112* 157*  CALCIUM 8.4 8.4   No results found for this basename: LABPT:2,INR:2 in the last 72 hours  EXAM General - Patient is Alert, Appropriate and Oriented Extremity - Neurovascular intact Sensation intact distally Dorsiflexion/Plantar flexion intact No cellulitis present Dressing/Incision - clean, dry, no drainage, healing Motor Function - intact, moving foot and toes well on exam.   Past Medical History  Diagnosis Date  . PONV (postoperative nausea and vomiting)   . Coronary artery disease   . GERD (gastroesophageal reflux disease)   . Arthritis   . Hyperlipidemia   . Sleep apnea     STOP BANG SCORE 8  . Carotid stenosis     07/04/11- 50-70%bilaterally- stable per Dr Ouida Sills note  . Chronic kidney disease     stones, BPH  . Lower leg edema   . Lumbar disc disease   .  Hypertension     clearance with OV notes Dr Ouida Sills on chart, EKG from 5/12 on chart f    Assessment/Plan: 2 Days Post-Op Procedure(s) (LRB): TOTAL HIP ARTHROPLASTY (Right) Principal Problem:  *OA (osteoarthritis) of hip   Up with therapy Discharge home with home health  DVT Prophylaxis - Xarelto Weight Bearing As Tolerated right Leg  Nera Haworth 12/10/2011, 10:18 AM  Patient seen again this afternoon with Dr. Lequita Halt.  He was done well with therapy and ready to gone home.  Will release today and follow up in the office. Avel Peace, PA-C

## 2011-12-11 DIAGNOSIS — I2510 Atherosclerotic heart disease of native coronary artery without angina pectoris: Secondary | ICD-10-CM | POA: Diagnosis not present

## 2011-12-11 DIAGNOSIS — M1710 Unilateral primary osteoarthritis, unspecified knee: Secondary | ICD-10-CM | POA: Diagnosis not present

## 2011-12-11 DIAGNOSIS — Z96649 Presence of unspecified artificial hip joint: Secondary | ICD-10-CM | POA: Diagnosis not present

## 2011-12-11 DIAGNOSIS — I10 Essential (primary) hypertension: Secondary | ICD-10-CM | POA: Diagnosis not present

## 2011-12-11 DIAGNOSIS — Z471 Aftercare following joint replacement surgery: Secondary | ICD-10-CM | POA: Diagnosis not present

## 2011-12-11 DIAGNOSIS — M169 Osteoarthritis of hip, unspecified: Secondary | ICD-10-CM | POA: Diagnosis not present

## 2011-12-11 NOTE — Care Management Note (Signed)
    Page 1 of 2   12/11/2011     5:47:47 PM   CARE MANAGEMENT NOTE 12/11/2011  Patient:  Caleb Wolf, Caleb Wolf   Account Number:  [id number]  Date Initiated:  12/10/2011  Documentation initiated by:  Colleen Can  Subjective/Objective Assessment:   dx OA hip -right; rt total hip replacemnt     Action/Plan:   CM spoke with patient and spouse. Plans are for pt to return to his home where spouse will be caregiver. Already has DME. Renie Ora for [facility] services   Anticipated DC Date:  12/10/2011   Anticipated DC Plan:  HOME W HOME HEALTH SERVICES  In-house referral  NA      DC Planning Services  CM consult      [facility] Choice  HOME HEALTH   Choice offered to / List presented to:  C-1 Patient   DME arranged  NA      DME agency  NA     HH arranged  HH-2 PT      [facility] agency  [facility]   Status of service:  Completed, signed off Medicare Important Message given?  NA - LOS <3 / Initial given by admissions (If response is "NO", the following Medicare IM given date fields will be blank) Date Medicare IM given:   Date Additional Medicare IM given:    Discharge Disposition:  HOME W HOME HEALTH SERVICES  Per UR Regulation:    If discussed at Long Length of Stay Meetings, dates discussed:    Comments:  HH services to start day after discharge

## 2011-12-15 DIAGNOSIS — M1710 Unilateral primary osteoarthritis, unspecified knee: Secondary | ICD-10-CM | POA: Diagnosis not present

## 2011-12-15 DIAGNOSIS — Z471 Aftercare following joint replacement surgery: Secondary | ICD-10-CM | POA: Diagnosis not present

## 2011-12-15 DIAGNOSIS — Z96649 Presence of unspecified artificial hip joint: Secondary | ICD-10-CM | POA: Diagnosis not present

## 2011-12-15 DIAGNOSIS — I10 Essential (primary) hypertension: Secondary | ICD-10-CM | POA: Diagnosis not present

## 2011-12-15 DIAGNOSIS — I2510 Atherosclerotic heart disease of native coronary artery without angina pectoris: Secondary | ICD-10-CM | POA: Diagnosis not present

## 2011-12-17 DIAGNOSIS — I2510 Atherosclerotic heart disease of native coronary artery without angina pectoris: Secondary | ICD-10-CM | POA: Diagnosis not present

## 2011-12-17 DIAGNOSIS — Z471 Aftercare following joint replacement surgery: Secondary | ICD-10-CM | POA: Diagnosis not present

## 2011-12-17 DIAGNOSIS — I10 Essential (primary) hypertension: Secondary | ICD-10-CM | POA: Diagnosis not present

## 2011-12-17 DIAGNOSIS — M1710 Unilateral primary osteoarthritis, unspecified knee: Secondary | ICD-10-CM | POA: Diagnosis not present

## 2011-12-17 DIAGNOSIS — Z96649 Presence of unspecified artificial hip joint: Secondary | ICD-10-CM | POA: Diagnosis not present

## 2011-12-19 DIAGNOSIS — M1710 Unilateral primary osteoarthritis, unspecified knee: Secondary | ICD-10-CM | POA: Diagnosis not present

## 2011-12-19 DIAGNOSIS — I2510 Atherosclerotic heart disease of native coronary artery without angina pectoris: Secondary | ICD-10-CM | POA: Diagnosis not present

## 2011-12-19 DIAGNOSIS — I10 Essential (primary) hypertension: Secondary | ICD-10-CM | POA: Diagnosis not present

## 2011-12-19 DIAGNOSIS — Z96649 Presence of unspecified artificial hip joint: Secondary | ICD-10-CM | POA: Diagnosis not present

## 2011-12-19 DIAGNOSIS — Z471 Aftercare following joint replacement surgery: Secondary | ICD-10-CM | POA: Diagnosis not present

## 2011-12-22 DIAGNOSIS — Z96649 Presence of unspecified artificial hip joint: Secondary | ICD-10-CM | POA: Diagnosis not present

## 2011-12-22 DIAGNOSIS — I10 Essential (primary) hypertension: Secondary | ICD-10-CM | POA: Diagnosis not present

## 2011-12-22 DIAGNOSIS — Z471 Aftercare following joint replacement surgery: Secondary | ICD-10-CM | POA: Diagnosis not present

## 2011-12-22 DIAGNOSIS — M1710 Unilateral primary osteoarthritis, unspecified knee: Secondary | ICD-10-CM | POA: Diagnosis not present

## 2011-12-22 DIAGNOSIS — I2510 Atherosclerotic heart disease of native coronary artery without angina pectoris: Secondary | ICD-10-CM | POA: Diagnosis not present

## 2011-12-24 DIAGNOSIS — Z96649 Presence of unspecified artificial hip joint: Secondary | ICD-10-CM | POA: Diagnosis not present

## 2011-12-24 DIAGNOSIS — I10 Essential (primary) hypertension: Secondary | ICD-10-CM | POA: Diagnosis not present

## 2011-12-24 DIAGNOSIS — Z471 Aftercare following joint replacement surgery: Secondary | ICD-10-CM | POA: Diagnosis not present

## 2011-12-24 DIAGNOSIS — I2510 Atherosclerotic heart disease of native coronary artery without angina pectoris: Secondary | ICD-10-CM | POA: Diagnosis not present

## 2011-12-24 DIAGNOSIS — M1710 Unilateral primary osteoarthritis, unspecified knee: Secondary | ICD-10-CM | POA: Diagnosis not present

## 2011-12-26 DIAGNOSIS — M1710 Unilateral primary osteoarthritis, unspecified knee: Secondary | ICD-10-CM | POA: Diagnosis not present

## 2011-12-26 DIAGNOSIS — Z96649 Presence of unspecified artificial hip joint: Secondary | ICD-10-CM | POA: Diagnosis not present

## 2011-12-26 DIAGNOSIS — I2510 Atherosclerotic heart disease of native coronary artery without angina pectoris: Secondary | ICD-10-CM | POA: Diagnosis not present

## 2011-12-26 DIAGNOSIS — Z471 Aftercare following joint replacement surgery: Secondary | ICD-10-CM | POA: Diagnosis not present

## 2011-12-26 DIAGNOSIS — I10 Essential (primary) hypertension: Secondary | ICD-10-CM | POA: Diagnosis not present

## 2011-12-30 DIAGNOSIS — M1710 Unilateral primary osteoarthritis, unspecified knee: Secondary | ICD-10-CM | POA: Diagnosis not present

## 2011-12-30 DIAGNOSIS — I10 Essential (primary) hypertension: Secondary | ICD-10-CM | POA: Diagnosis not present

## 2011-12-30 DIAGNOSIS — I2510 Atherosclerotic heart disease of native coronary artery without angina pectoris: Secondary | ICD-10-CM | POA: Diagnosis not present

## 2011-12-30 DIAGNOSIS — Z471 Aftercare following joint replacement surgery: Secondary | ICD-10-CM | POA: Diagnosis not present

## 2011-12-30 DIAGNOSIS — Z96649 Presence of unspecified artificial hip joint: Secondary | ICD-10-CM | POA: Diagnosis not present

## 2012-01-01 DIAGNOSIS — I2510 Atherosclerotic heart disease of native coronary artery without angina pectoris: Secondary | ICD-10-CM | POA: Diagnosis not present

## 2012-01-01 DIAGNOSIS — M1710 Unilateral primary osteoarthritis, unspecified knee: Secondary | ICD-10-CM | POA: Diagnosis not present

## 2012-01-01 DIAGNOSIS — Z96649 Presence of unspecified artificial hip joint: Secondary | ICD-10-CM | POA: Diagnosis not present

## 2012-01-01 DIAGNOSIS — I10 Essential (primary) hypertension: Secondary | ICD-10-CM | POA: Diagnosis not present

## 2012-01-01 DIAGNOSIS — Z471 Aftercare following joint replacement surgery: Secondary | ICD-10-CM | POA: Diagnosis not present

## 2012-01-02 DIAGNOSIS — M109 Gout, unspecified: Secondary | ICD-10-CM | POA: Diagnosis not present

## 2012-01-06 DIAGNOSIS — I10 Essential (primary) hypertension: Secondary | ICD-10-CM | POA: Diagnosis not present

## 2012-01-06 DIAGNOSIS — Z471 Aftercare following joint replacement surgery: Secondary | ICD-10-CM | POA: Diagnosis not present

## 2012-01-06 DIAGNOSIS — M1710 Unilateral primary osteoarthritis, unspecified knee: Secondary | ICD-10-CM | POA: Diagnosis not present

## 2012-01-06 DIAGNOSIS — Z96649 Presence of unspecified artificial hip joint: Secondary | ICD-10-CM | POA: Diagnosis not present

## 2012-01-06 DIAGNOSIS — I2510 Atherosclerotic heart disease of native coronary artery without angina pectoris: Secondary | ICD-10-CM | POA: Diagnosis not present

## 2012-01-07 DIAGNOSIS — I2510 Atherosclerotic heart disease of native coronary artery without angina pectoris: Secondary | ICD-10-CM | POA: Diagnosis not present

## 2012-01-07 DIAGNOSIS — M1710 Unilateral primary osteoarthritis, unspecified knee: Secondary | ICD-10-CM | POA: Diagnosis not present

## 2012-01-07 DIAGNOSIS — Z96649 Presence of unspecified artificial hip joint: Secondary | ICD-10-CM | POA: Diagnosis not present

## 2012-01-07 DIAGNOSIS — I10 Essential (primary) hypertension: Secondary | ICD-10-CM | POA: Diagnosis not present

## 2012-01-07 DIAGNOSIS — Z471 Aftercare following joint replacement surgery: Secondary | ICD-10-CM | POA: Diagnosis not present

## 2012-01-15 DIAGNOSIS — I2510 Atherosclerotic heart disease of native coronary artery without angina pectoris: Secondary | ICD-10-CM | POA: Diagnosis not present

## 2012-01-15 DIAGNOSIS — Z471 Aftercare following joint replacement surgery: Secondary | ICD-10-CM | POA: Diagnosis not present

## 2012-01-15 DIAGNOSIS — M1710 Unilateral primary osteoarthritis, unspecified knee: Secondary | ICD-10-CM | POA: Diagnosis not present

## 2012-01-15 DIAGNOSIS — I10 Essential (primary) hypertension: Secondary | ICD-10-CM | POA: Diagnosis not present

## 2012-01-15 DIAGNOSIS — Z96649 Presence of unspecified artificial hip joint: Secondary | ICD-10-CM | POA: Diagnosis not present

## 2012-01-16 DIAGNOSIS — M169 Osteoarthritis of hip, unspecified: Secondary | ICD-10-CM | POA: Diagnosis not present

## 2012-01-29 ENCOUNTER — Ambulatory Visit (HOSPITAL_COMMUNITY)
Admission: RE | Admit: 2012-01-29 | Discharge: 2012-01-29 | Disposition: A | Discharge status: Home / Self Care | Payer: Medicare Other | Source: Ambulatory Visit | Attending: Orthopedic Surgery | Admitting: Orthopedic Surgery

## 2012-01-29 DIAGNOSIS — M25559 Pain in unspecified hip: Secondary | ICD-10-CM | POA: Insufficient documentation

## 2012-01-29 DIAGNOSIS — IMO0001 Reserved for inherently not codable concepts without codable children: Secondary | ICD-10-CM | POA: Diagnosis not present

## 2012-01-29 DIAGNOSIS — M25569 Pain in unspecified knee: Secondary | ICD-10-CM | POA: Diagnosis not present

## 2012-01-29 DIAGNOSIS — R262 Difficulty in walking, not elsewhere classified: Secondary | ICD-10-CM | POA: Diagnosis not present

## 2012-01-29 DIAGNOSIS — R2689 Other abnormalities of gait and mobility: Secondary | ICD-10-CM | POA: Insufficient documentation

## 2012-01-29 NOTE — Evaluation (Signed)
Physical Therapy Evaluation  Patient Details  Name: Caleb Wolf MRN: [id number] Date of Birth: 16-Dec-1940  Today's Date: 01/29/2012 Time: 4098-1191 PT Time Calculation (min): 50 min  Visit#: 1  of 8   Re-eval: 02/28/12 Assessment Diagnosis: R THR Surgical Date: 12/08/11 Next MD Visit: 03/11/12 Prior Therapy: HH  Authorization: medicare  Authorization Time Period:    Authorization Visit#: 1  of 10    Past Medical History:  Past Medical History  Diagnosis Date  . PONV (postoperative nausea and vomiting)   . Coronary artery disease   . GERD (gastroesophageal reflux disease)   . Arthritis   . Hyperlipidemia   . Sleep apnea     STOP BANG SCORE 8  . Carotid stenosis     07/04/11- 50-70%bilaterally- stable per Dr Ouida Sills note  . Chronic kidney disease     stones, BPH  . Lower leg edema   . Lumbar disc disease   . Hypertension     clearance with OV notes Dr Ouida Sills on chart, EKG from 5/12 on chart f   Past Surgical History:  Past Surgical History  Procedure Date  . Fracture surgery     bilateral wrist fractures- one ORIF  . Rhinoplasty   . Joint replacement 2011    left knee  . Eye surgery     cataract extraction with repair macular tear , with IOL     right  . Total hip arthroplasty 12/08/2011    Procedure: TOTAL HIP ARTHROPLASTY;  Surgeon: Loanne Drilling, MD;  Location: [facility] ORS;  Service: Orthopedics;  Laterality: Right;    Subjective Symptoms/Limitations Symptoms: Mr. Braniff states that he had hip surgery on July 8th.  He went home on the 10th with home health.  Home health came out for four weeks.  He is now being referred to therapy to get the strengthening in his hip Pertinent History:  LTKR How long can you sit comfortably?: He is able to sit for an hour without difficulty at this time How long can you stand comfortably?: The patient states that he is only able to stand for five to ten minutes. How long can you walk comfortably?: The patient states that he is walking  for about 10 minutes before he wants to rest.  The patient is not using any assistive device. Pain Assessment Currently in Pain?: Yes Pain Score:   3 Pain Location: Hip Pain Orientation: Right Pain Type: Surgical pain Pain Onset: 1 to 4 weeks ago Pain Frequency: Intermittent  Precautions/Restrictions THR Precautions Precautions: Posterior Hip  Prior Functioning  Home Living Lives With: Spouse Prior Function Able to Take Stairs?: Reciprically Driving: Yes Vocation: Retired Leisure: Hobbies-yes (Comment) Comments: gardening  Cognition/Observation Cognition Overall Cognitive Status: Appears within functional limits for tasks assessed  Sensation/Coordination/Flexibility/Functional Tests Functional Tests Functional Tests: LEFS 48/64  Functional Tests: ABC 72  Assessment RLE Strength Right Hip Flexion: 5/5 Right Hip Extension: 3+/5 Right Hip ABduction: 4/5 Right Hip ADduction: 4/5 Right Knee Flexion: 5/5 Right Knee Extension: 5/5 Right Ankle Dorsiflexion: 4/5  Exercise/Treatments Mobility/Balance  Berg Balance Test Sit to Stand: Able to stand without using hands and stabilize independently Standing Unsupported: Able to stand safely 2 minutes Sitting with Back Unsupported but Feet Supported on Floor or Stool: Able to sit safely and securely 2 minutes Stand to Sit: Sits safely with minimal use of hands Transfers: Able to transfer safely, minor use of hands Standing Unsupported with Eyes Closed: Able to stand 10 seconds safely Standing Ubsupported with Feet  Together: Able to place feet together independently and stand for 1 minute with supervision From Standing, Reach Forward with Outstretched Arm: Can reach forward >12 cm safely (5") From Standing Position, Pick up Object from Floor: Able to pick up shoe, needs supervision From Standing Position, Turn to Look Behind Over each Shoulder: Looks behind one side only/other side shows less weight shift Turn 360 Degrees: Able  to turn 360 degrees safely in 4 seconds or less Standing Unsupported, Alternately Place Feet on Step/Stool: Able to stand independently and safely and complete 8 steps in 20 seconds Standing Unsupported, One Foot in Front: Able to plae foot ahead of the other independently and hold 30 seconds Standing on One Leg: Able to lift leg independently and hold equal to or more than 3 seconds Total Score: 49    Supine Hip Adduction Isometric: 10 reps Bridges: 10 reps Sidelying Hip ABduction: 10 reps     Physical Therapy Assessment and Plan PT Assessment and Plan Clinical Impression Statement: Pt s/p TKR who has decreased stability in R hip who will benefit from skilled therapy to achieve maximal functional potential Pt will benefit from skilled therapeutic intervention in order to improve on the following deficits: Decreased balance;Decreased activity tolerance;Decreased strength;Difficulty walking;Pain Rehab Potential: Good PT Frequency: Min 2X/week PT Duration: 4 weeks PT Treatment/Interventions: Gait training;Therapeutic exercise;Balance training PT Plan: begin gait trainer, vector stance, rocker board, cybex leg press; leg lunges, side stepping w/t-band, slow marching with holds, sit to stand next treatement.    Goals Home Exercise Program Pt will Perform Home Exercise Program: Independently PT Short Term Goals Time to Complete Short Term Goals: 2 weeks PT Short Term Goal 1: Pt to be able to walk for 15 minutes PT Long Term Goals Time to Complete Long Term Goals: 4 weeks PT Long Term Goal 1: I in advance HEP PT Long Term Goal 2: Berg balance increased by 8  Long Term Goal 3: Pt ABC improved to 85 Long Term Goal 4: LEFS increased by 10 PT Long Term Goal 5: mm strength wnl Additional PT Long Term Goals?: Yes PT Long Term Goal 6: Pt able to stand for 15 minutes without difficulty PT Long Term Goal 7: Pt to be able to walk for 30 minutes without difficulty PT Long Term Goal 8: pain no  greater than a 1 80 % of the day  Problem List Patient Active Problem List  Diagnosis  . OA (osteoarthritis) of hip  . Difficulty in walking  . Balance disorder    PT - End of Session Activity Tolerance: Patient tolerated treatment well General Behavior During Session: [facility] for tasks performed PT Plan of Care PT Home Exercise Plan: given Consulted and Agree with Plan of Care: Patient  GP Functional Limitation: Mobility: Walking and moving around Mobility: Walking and Moving Around Current Status (W0981): At least 20 percent but less than 40 percent impaired, limited or restricted Mobility: Walking and Moving Around Goal Status [id number]): At least 1 percent but less than 20 percent impaired, limited or restricted  RUSSELL,CINDY 01/29/2012, 4:32 PM  Physician Documentation Your signature is required to indicate approval of the treatment plan as stated above.  Please sign and either send electronically or make a copy of this report for your files and return this physician signed original.   Please mark one 1._x_approve of plan  2. ___approve of plan with the following conditions.   ______________________________  _____________________ Physician Signature                                                                                                             Date

## 2012-02-04 ENCOUNTER — Ambulatory Visit (HOSPITAL_COMMUNITY)
Admission: RE | Admit: 2012-02-04 | Discharge: 2012-02-04 | Disposition: A | Discharge status: Home / Self Care | Payer: Medicare Other | Source: Ambulatory Visit | Attending: Internal Medicine | Admitting: Internal Medicine

## 2012-02-04 DIAGNOSIS — R2689 Other abnormalities of gait and mobility: Secondary | ICD-10-CM

## 2012-02-04 DIAGNOSIS — R262 Difficulty in walking, not elsewhere classified: Secondary | ICD-10-CM | POA: Diagnosis not present

## 2012-02-04 DIAGNOSIS — M25569 Pain in unspecified knee: Secondary | ICD-10-CM | POA: Diagnosis not present

## 2012-02-04 DIAGNOSIS — IMO0001 Reserved for inherently not codable concepts without codable children: Secondary | ICD-10-CM | POA: Insufficient documentation

## 2012-02-04 DIAGNOSIS — M25559 Pain in unspecified hip: Secondary | ICD-10-CM | POA: Diagnosis not present

## 2012-02-04 NOTE — Progress Notes (Signed)
Physical Therapy Treatment Patient Details  Name: Caleb Wolf MRN: [id number] Date of Birth: [birth year]  Today's Date: 02/04/2012 Time: 1520-1600 PT Time Calculation (min): 40 min  Visit#: 2  of 8   Re-eval: 02/28/12  Charge: gait 8', therex 30'  Authorization: Medicare  Authorization Time Period:    Authorization Visit#: 2  of 10    Subjective: Symptoms/Limitations Symptoms: Pt reported compliance with HEP, only question pt had is how many times a day he should complete.  Pt stated no real pain,, hip is just a little sore. Pain Assessment Currently in Pain?: No/denies  Objective:   Exercise/Treatments Machines for Strengthening Cybex Knee Extension: 3 Pl BLE 10x Cybex Knee Flexion: 5 PL BLE 10x  Standing Forward Lunges: Both;5 reps;Limitations Forward Lunges Limitations: with tactile cueing for form Rocker Board: 2 minutes;Limitations Rocker Board Limitations: R/L with intermittent HHA SLS with Vectors: 5x 5" with intermittent 1 finger HHA Gait Training: Treadmill gait training .60 cyc/sec x 8 min Other Standing Knee Exercises: sidestepping with green tband 2 RT Other Standing Knee Exercises: high march hold 10x 5" Seated Other Seated Knee Exercises: 5 STS no HHA 14"  Physical Therapy Assessment and Plan PT Assessment and Plan Clinical Impression Statement: Began R LE gait, strength training and stability exercises per PT POC.  Pt able to demonstrate proper technique with all activities following cues with intermittent HHA required for LOB episdoes.   PT Plan: Continue with current POC, begin cybex leg press and improve form with forward lunges next session.    Goals    Problem List Patient Active Problem List  Diagnosis  . OA (osteoarthritis) of hip  . Difficulty in walking  . Balance disorder    PT - End of Session Activity Tolerance: Patient tolerated treatment well General Behavior During Session: [facility] for tasks performed Cognition: [facility] for tasks  performed  GP    Juel Burrow 02/04/2012, 4:07 PM

## 2012-02-05 ENCOUNTER — Ambulatory Visit (HOSPITAL_COMMUNITY)
Admission: RE | Admit: 2012-02-05 | Discharge: 2012-02-05 | Disposition: A | Discharge status: Home / Self Care | Payer: Medicare Other | Source: Ambulatory Visit | Attending: Internal Medicine | Admitting: Internal Medicine

## 2012-02-05 DIAGNOSIS — M25559 Pain in unspecified hip: Secondary | ICD-10-CM | POA: Diagnosis not present

## 2012-02-05 DIAGNOSIS — IMO0001 Reserved for inherently not codable concepts without codable children: Secondary | ICD-10-CM | POA: Diagnosis not present

## 2012-02-05 DIAGNOSIS — M25569 Pain in unspecified knee: Secondary | ICD-10-CM | POA: Diagnosis not present

## 2012-02-05 DIAGNOSIS — R262 Difficulty in walking, not elsewhere classified: Secondary | ICD-10-CM | POA: Diagnosis not present

## 2012-02-05 NOTE — Progress Notes (Signed)
Physical Therapy Treatment Patient Details  Name: Caleb Wolf MRN: [id number] Date of Birth: 02/02/1941  Today's Date: 02/05/2012 Time: 9147-8295 PT Time Calculation (min): 40 min  Visit#: 3  of 8   Re-eval: 02/28/12 Charges: Gait x 8' Therex x 30'  Authorization: Medicare  Authorization Visit#: 3  of 10    Subjective: Symptoms/Limitations Symptoms: Pt reports some soreness but no pain this session. Pain Assessment Currently in Pain?: No/denies Pain Score: 0-No pain   Exercise/Treatments Machines for Strengthening Cybex Knee Extension: 3 Pl BLE 2x10 Cybex Knee Flexion: 5 PL BLE 2x10  Standing Forward Lunges: Both;5 reps;Limitations Rocker Board: 2 minutes;Limitations Rocker Board Limitations: R/L with intermittent HHA SLS with Vectors: 5x 5" with intermittent 1 finger HHA Gait Training: Treadmill gait training .60 cyc/sec x 8 min Other Standing Knee Exercises: sidestepping with green tband 2 RT Other Standing Knee Exercises: high march hold 10x 5"  Physical Therapy Assessment and Plan PT Assessment and Plan Clinical Impression Statement: Pt appears to be progressing well. Pt completes forward lunges with improved form and decreased need for cueing. Pt presents with improved gait quality on TM but does tend to take smaller step with L LE. Pt is able to correct this when cued. Pt reports no increase in pain at end of session. PT Plan: Continue to progress strength/stability and gait per PT POC.    Problem List Patient Active Problem List  Diagnosis  . OA (osteoarthritis) of hip  . Difficulty in walking  . Balance disorder    PT - End of Session Activity Tolerance: Patient tolerated treatment well General Behavior During Session: [facility] for tasks performed Cognition: [facility] for tasks performed  Seth Bake, PTA 02/05/2012, 4:38 PM

## 2012-02-09 ENCOUNTER — Ambulatory Visit (HOSPITAL_COMMUNITY)
Admission: RE | Admit: 2012-02-09 | Discharge: 2012-02-09 | Disposition: A | Discharge status: Home / Self Care | Payer: Medicare Other | Source: Ambulatory Visit | Attending: Internal Medicine | Admitting: Internal Medicine

## 2012-02-09 DIAGNOSIS — M25569 Pain in unspecified knee: Secondary | ICD-10-CM | POA: Diagnosis not present

## 2012-02-09 DIAGNOSIS — M25559 Pain in unspecified hip: Secondary | ICD-10-CM | POA: Diagnosis not present

## 2012-02-09 DIAGNOSIS — R262 Difficulty in walking, not elsewhere classified: Secondary | ICD-10-CM | POA: Diagnosis not present

## 2012-02-09 DIAGNOSIS — IMO0001 Reserved for inherently not codable concepts without codable children: Secondary | ICD-10-CM | POA: Diagnosis not present

## 2012-02-09 NOTE — Progress Notes (Signed)
Physical Therapy Treatment Patient Details  Name: DENSON NICCOLI MRN: [id number] Date of Birth: 1940-06-11  Today's Date: 02/09/2012 Time: 1520-1600 PT Time Calculation (min): 40 min  Visit#: 4  of 8   Re-eval: 02/28/12 Charges: Gait x 8' Therex x 32'  Authorization: Medicare  Authorization Visit#: 4  of 10    Subjective: Symptoms/Limitations Symptoms: Pt states that he was not too sore after last session. Pain Assessment Currently in Pain?: No/denies Pain Score: 0-No pain   Exercise/Treatments Machines for Strengthening Cybex Knee Extension: 3 Pl BLE 2x10 Cybex Knee Flexion: 6 PL BLE 2x10  Cybex Leg Press: 6 PL 2x10 Standing Forward Lunges: Both;5 reps;Limitations Rocker Board: 2 minutes;Limitations Rocker Board Limitations: R/L with intermittent HHA SLS with Vectors: 5x 5" with intermittent 1 finger HHA Gait Training: Treadmill gait training .60 cyc/sec x 8 min Other Standing Knee Exercises: sidestepping with green tband 2 RT Other Standing Knee Exercises: high march hold 10x 5"  Physical Therapy Assessment and Plan PT Assessment and Plan Clinical Impression Statement: Pt presents with improved stability with SLS with vectors and rocker board this session, requiring decreased HHA with these exercises. Pt also displays improved stride length with gait training on TM. Pt is without complaint throughout session.  PT Plan: Continue to progress strength/stability and gait per PT POC.     Problem List Patient Active Problem List  Diagnosis  . OA (osteoarthritis) of hip  . Difficulty in walking  . Balance disorder    PT - End of Session Activity Tolerance: Patient tolerated treatment well General Behavior During Session: [facility] for tasks performed Cognition: [facility] for tasks performed  Seth Bake, PTA 02/09/2012, 6:10 PM

## 2012-02-10 ENCOUNTER — Ambulatory Visit (HOSPITAL_COMMUNITY): Payer: Medicare Other | Admitting: *Deleted

## 2012-02-11 ENCOUNTER — Ambulatory Visit (HOSPITAL_COMMUNITY)
Admission: RE | Admit: 2012-02-11 | Discharge: 2012-02-11 | Disposition: A | Discharge status: Home / Self Care | Payer: Medicare Other | Source: Ambulatory Visit | Attending: Internal Medicine | Admitting: Internal Medicine

## 2012-02-11 DIAGNOSIS — IMO0001 Reserved for inherently not codable concepts without codable children: Secondary | ICD-10-CM | POA: Diagnosis not present

## 2012-02-11 DIAGNOSIS — M25559 Pain in unspecified hip: Secondary | ICD-10-CM | POA: Diagnosis not present

## 2012-02-11 DIAGNOSIS — R262 Difficulty in walking, not elsewhere classified: Secondary | ICD-10-CM | POA: Diagnosis not present

## 2012-02-11 DIAGNOSIS — M25569 Pain in unspecified knee: Secondary | ICD-10-CM | POA: Diagnosis not present

## 2012-02-11 NOTE — Progress Notes (Signed)
Physical Therapy Treatment Patient Details  Name: Caleb Wolf MRN: [id number] Date of Birth: 02/01/1941  Today's Date: 02/11/2012 Time: 4098-1191 PT Time Calculation (min): 45 min  Visit#: 5  of 8   Re-eval: 02/28/12 Charges: Therex x 32' Gait x 8'  Authorization: Medicare  Authorization Visit#: 5  of 10    Subjective: Symptoms/Limitations Symptoms: Pt states that he has been having some soreness in his L knee but only when he is walking. Pain Assessment Currently in Pain?: Yes Pain Score:   3 (With walking; no pain at rest) Pain Location: Knee Pain Orientation: Left    Exercise/Treatments Machines for Strengthening Cybex Knee Extension: 3.5 Pl BLE 2x10 Cybex Knee Flexion: 6.5 PL BLE 2x10  Cybex Leg Press: 7 PL 2x10 Standing Forward Lunges: 10 reps;Both Rocker Board: 2 minutes;Limitations Rocker Board Limitations: R/L w/o HHA SLS with Vectors: 5x 5" with intermittent 1 finger HHA Gait Training: Treadmill gait training .65 cyc/sec x 8 min Other Standing Knee Exercises: sidestepping with blue tband 2 RT Seated Other Seated Knee Exercises: 10 STS no HHA   Physical Therapy Assessment and Plan PT Assessment and Plan Clinical Impression Statement: Pt toelrates increases in therex well. Pt displays improved power and fucntional strength with sit to stand. Pt displays improved gait mechanics with gait training on TM.  PT Plan: Continue to progress strength/stability and gait per PT POC.     Problem List Patient Active Problem List  Diagnosis  . OA (osteoarthritis) of hip  . Difficulty in walking  . Balance disorder    PT - End of Session Activity Tolerance: Patient tolerated treatment well General Behavior During Session: [facility] for tasks performed Cognition: [facility] for tasks performed  Seth Bake, PTA 02/11/2012, 3:44 PM

## 2012-02-12 ENCOUNTER — Ambulatory Visit (HOSPITAL_COMMUNITY): Payer: Medicare Other | Admitting: Physical Therapy

## 2012-02-17 ENCOUNTER — Ambulatory Visit (HOSPITAL_COMMUNITY)
Admission: RE | Admit: 2012-02-17 | Discharge: 2012-02-17 | Disposition: A | Discharge status: Home / Self Care | Payer: Medicare Other | Source: Ambulatory Visit | Attending: Internal Medicine | Admitting: Internal Medicine

## 2012-02-17 ENCOUNTER — Ambulatory Visit (HOSPITAL_COMMUNITY): Payer: Medicare Other | Admitting: *Deleted

## 2012-02-17 DIAGNOSIS — M25559 Pain in unspecified hip: Secondary | ICD-10-CM | POA: Diagnosis not present

## 2012-02-17 DIAGNOSIS — IMO0001 Reserved for inherently not codable concepts without codable children: Secondary | ICD-10-CM | POA: Diagnosis not present

## 2012-02-17 DIAGNOSIS — R262 Difficulty in walking, not elsewhere classified: Secondary | ICD-10-CM | POA: Diagnosis not present

## 2012-02-17 DIAGNOSIS — M25569 Pain in unspecified knee: Secondary | ICD-10-CM | POA: Diagnosis not present

## 2012-02-17 NOTE — Progress Notes (Signed)
Physical Therapy Treatment Patient Details  Name: Caleb Wolf MRN: [id number] Date of Birth: 1941-05-13  Today's Date: 02/17/2012 Time: 1478-2956 PT Time Calculation (min): 45 min Visit#: 6  of 8   Re-eval: 02/27/12 Charges: gait 10', therex 32'    Authorization: Medicare  Authorization Visit#: 6  of 8    Subjective: Symptoms/Limitations Symptoms: Pt. states his knee has been hurting worse since last visit; states he lost his balance on the board (rockerboard) and jolted his knee a little. Currently with 8/10 pain in L knee. Pain Assessment Currently in Pain?: Yes Pain Score:   8 Pain Location: Knee Pain Orientation: Left   Exercise/Treatments Machines for Strengthening Cybex Knee Extension: 3.5 PL BLE 2x15 Cybex Knee Flexion: 6.5 PL BLE 2x15  Cybex Leg Press: 7 PL 2x15 Standing Forward Lunges: 10 reps;Both Wall Squat: 5 reps;5 seconds Rocker Board: Limitations Rocker Board Limitations: HOLD per request SLS with Vectors: 5x 5" with 1 finger HHA Gait Training: Treadmill gait training .65 cyc/sec x 8 min Other Standing Knee Exercises: tandem gait 2RT Other Standing Knee Exercises: High march walking 2RT Seated Other Seated Knee Exercises: 10 STS no HHA (25" to complete)     Physical Therapy Assessment and Plan PT Assessment and Plan Clinical Impression Statement: Held rockerboard today per pt. request, however found his pain may be coming from doing squats incorrectly at home.  Worked with patient on correct form and added in wallslides.  Pt. able to complete tandem gait with CGA.  Improving stability in LE overall. PT Plan: Progress to tandem on balance beam next visit.     Problem List Patient Active Problem List  Diagnosis  . OA (osteoarthritis) of hip  . Difficulty in walking  . Balance disorder    PT - End of Session Activity Tolerance: Patient tolerated treatment well;Treatment limited secondary to agitation General Behavior During Session: [facility] for  tasks performed Cognition: [facility] for tasks performed   Lurena Nida, PTA/CLT 02/17/2012, 4:51 PM

## 2012-02-18 DIAGNOSIS — I10 Essential (primary) hypertension: Secondary | ICD-10-CM | POA: Diagnosis not present

## 2012-02-19 ENCOUNTER — Ambulatory Visit (HOSPITAL_COMMUNITY)
Admission: RE | Admit: 2012-02-19 | Discharge: 2012-02-19 | Disposition: A | Discharge status: Home / Self Care | Payer: Medicare Other | Source: Ambulatory Visit | Attending: Internal Medicine | Admitting: Internal Medicine

## 2012-02-19 DIAGNOSIS — M25559 Pain in unspecified hip: Secondary | ICD-10-CM | POA: Diagnosis not present

## 2012-02-19 DIAGNOSIS — R262 Difficulty in walking, not elsewhere classified: Secondary | ICD-10-CM

## 2012-02-19 DIAGNOSIS — IMO0001 Reserved for inherently not codable concepts without codable children: Secondary | ICD-10-CM | POA: Diagnosis not present

## 2012-02-19 DIAGNOSIS — M25569 Pain in unspecified knee: Secondary | ICD-10-CM | POA: Diagnosis not present

## 2012-02-19 DIAGNOSIS — R2689 Other abnormalities of gait and mobility: Secondary | ICD-10-CM

## 2012-02-19 NOTE — Progress Notes (Signed)
Physical Therapy Treatment Patient Details  Name: Caleb Wolf MRN: [id number] Date of Birth: Jul 03, 1940  Today's Date: 02/19/2012 Time: 1515-1604 PT Time Calculation (min): 49 min  Visit#: 7  of 8   Re-eval: 02/27/12  Charge: gait 10', therex 38'  Authorization: Medicare  Authorization Time Period:    Authorization Visit#: 7  of 8    Subjective: Symptoms/Limitations Symptoms: Pt continues to have increased pain L knee currently 8-9/10 today, states he lost his balance on the board (rockerboard) and jolted his knee a little.  R hip is okay today, pain scale 4/10 today. Pain Assessment Currently in Pain?: Yes Pain Score:   4 Pain Location: Hip Pain Orientation: Right Multiple Pain Sites: Yes  Objective:   Exercise/Treatments Machines for Strengthening Cybex Knee Extension: 3.5 PL BLE 2x15 Cybex Knee Flexion: 6.5 PL BLE 2x15  Cybex Leg Press: 7 PL 2x15 Standing Forward Lunges: 10 reps;Both Wall Squat: 10 reps;5 seconds Rocker Board: Limitations Rocker Board Limitations: HOLD per request SLS with Vectors: 10x 5" with 1 finger HHA Gait Training: Treadmill gait training .65 cyc/sec x 8 min Other Standing Knee Exercises: tandem gait 1RT on static and dynamic surfaces 1 RT Other Standing Knee Exercises: High march walking 2RT Seated Other Seated Knee Exercises: 10 STS no HHA from 18in chair    Physical Therapy Assessment and Plan PT Assessment and Plan Clinical Impression Statement: Progressed balance training to dynamic surface with vc-ing for spatial awareness.  Pt required min assistance with new activity following cues.  Continued to hold rockerboard today per pt. request.  Overall LE strength improving, able to perform 10 STS wtih no HHA from lower surface this session without difficulty. PT Plan: Reassess next session.  Continue progressing per PT POC to increase strength, improve balance and gait mechanics.    Goals    Problem List Patient Active Problem List    Diagnosis  . OA (osteoarthritis) of hip  . Difficulty in walking  . Balance disorder    PT - End of Session Activity Tolerance: Patient tolerated treatment well General Behavior During Session: [facility] for tasks performed Cognition: [facility] for tasks performed  GP    Juel Burrow 02/19/2012, 6:40 PM

## 2012-02-24 ENCOUNTER — Ambulatory Visit (HOSPITAL_COMMUNITY)
Admission: RE | Admit: 2012-02-24 | Discharge: 2012-02-24 | Disposition: A | Discharge status: Home / Self Care | Payer: Medicare Other | Source: Ambulatory Visit | Attending: Internal Medicine | Admitting: Internal Medicine

## 2012-02-24 ENCOUNTER — Ambulatory Visit (HOSPITAL_COMMUNITY): Payer: Medicare Other | Admitting: Physical Therapy

## 2012-02-24 DIAGNOSIS — M25559 Pain in unspecified hip: Secondary | ICD-10-CM | POA: Diagnosis not present

## 2012-02-24 DIAGNOSIS — IMO0001 Reserved for inherently not codable concepts without codable children: Secondary | ICD-10-CM | POA: Diagnosis not present

## 2012-02-24 DIAGNOSIS — R262 Difficulty in walking, not elsewhere classified: Secondary | ICD-10-CM | POA: Diagnosis not present

## 2012-02-24 DIAGNOSIS — M25569 Pain in unspecified knee: Secondary | ICD-10-CM | POA: Diagnosis not present

## 2012-02-24 NOTE — Evaluation (Cosign Needed Addendum)
Physical Therapy Discharge  Patient Details  Name: ELLIAS MCELREATH MRN: [id number] Date of Birth: 1940-08-01  Today's Date: 02/24/2012 Time: 4098-1191 PT Time Calculation (min): 49 min Charges:  therex 12', PPT 15', MMT X 1 unit Visit#: 8  of 8   Re-eval:   Diagnosis: R THR Surgical Date: 12/08/11 Next MD Visit: 03/11/12  Authorization: Medicare  Authorization Visit#: 8  of 8   G Codes completed today   Subjective Symptoms/Limitations Symptoms: Pt. states he is doing well at home.  Currently without pain.  Pt. is walking to the end of his block and back, approx. 30'.  States he feels his balance is still a little off, but much stronger and improved.  Precautions/Restrictions  Precautions Precautions: Posterior Hip   Sensation/Coordination/Flexibility/Functional Tests LEFS (LE functional scale):  56/64 (was 48/64) ABC (balance confidence scale):  80% (was 72%) BERG balance test:  55/56 (was 49/56)  Objective: RLE Strength Right Hip Flexion: 5/5 (was 5/5) Right Hip Extension: 4/5 (was 3+/5) Right Hip ABduction: 5/5 (was 4/5) Right Hip ADduction: 5/5 (was 4/5) Right Knee Flexion: 5/5 (was 5/5) Right Knee Extension: 5/5 (was 5/5) Right Ankle Dorsiflexion: 5/5 (was 4/5)  Exercise/Treatments Machines for Strengthening Cybex Knee Extension: 3.5 PL BLE 2x15 Cybex Knee Flexion: 6.5 PL BLE 2x15  Cybex Leg Press: 7 PL 2x15     Physical Therapy Assessment and Plan PT Assessment and Plan Clinical Impression Statement: Pt. with much improved strength and balance.  Pt. has met all STG's and all but 3 LTG's, though close to meeting.  Pt. wishes to continue HEP and work on balance at home.  Pt. has progressed well with therapy. PT Plan: Discharge to HEP.    Goals Home Exercise Program Pt will Perform Home Exercise Program: Independently PT Goal: Perform Home Exercise Program - Progress: Met  PT Short Term Goals Time to Complete Short Term Goals: 2 weeks PT Short Term Goal 1:  Pt to be able to walk for 15 minutes PT Short Term Goal 1 - Progress: Met  PT Long Term Goals Time to Complete Long Term Goals: 4 weeks PT Long Term Goal 1: I in advance HEP PT Long Term Goal 1 - Progress: Met PT Long Term Goal 2: Berg balance increased by 8  PT Long Term Goal 2 - Progress: Met (ORIGINALLY 49/56, NOW 55/56 (goal value miscalculated)) Long Term Goal 3: Pt ABC improved to 85 Long Term Goal 3 Progress: Partly met (now 80% was72%) Long Term Goal 4: LEFS increased by 10 Long Term Goal 4 Progress: Partly met (increased 8 levels) PT Long Term Goal 5: mm strength wnl Long Term Goal 5 Progress: Met PT Long Term Goal 6: Pt able to stand for 15 minutes without difficulty Long Term Goal 6 Progress: Met PT Long Term Goal 7: Pt to be able to walk for 30 minutes without difficulty Long Term Goal 7 Progress: Partly met Long Term Goal 8 Progress: Met  Problem List Patient Active Problem List  Diagnosis  . OA (osteoarthritis) of hip  . Difficulty in walking  . Balance disorder    PT - End of Session Activity Tolerance: Patient tolerated treatment well General Behavior During Session: [facility] for tasks performed Cognition: [facility] for tasks performed  GP Functional Assessment Tool Used: LEFS, ABC questionnaire Functional Limitation: Mobility: Walking and moving around Mobility: Walking and Moving Around Current Status (Y7829): At least 1 percent but less than 20 percent impaired, limited or restricted Mobility: Walking and Moving Around Goal Status (  I6962): At least 1 percent but less than 20 percent impaired, limited or restricted Mobility: Walking and Moving Around Discharge Status [phone]): At least 1 percent but less than 20 percent impaired, limited or restricted  Lurena Nida, PTA/CLT 02/24/2012, 10:06 AM

## 2012-02-26 ENCOUNTER — Ambulatory Visit (HOSPITAL_COMMUNITY): Payer: Medicare Other | Admitting: Physical Therapy

## 2012-03-24 DIAGNOSIS — Z23 Encounter for immunization: Secondary | ICD-10-CM | POA: Diagnosis not present

## 2012-04-05 DIAGNOSIS — N200 Calculus of kidney: Secondary | ICD-10-CM | POA: Insufficient documentation

## 2012-04-05 DIAGNOSIS — E291 Testicular hypofunction: Secondary | ICD-10-CM | POA: Diagnosis not present

## 2012-04-05 DIAGNOSIS — N529 Male erectile dysfunction, unspecified: Secondary | ICD-10-CM | POA: Diagnosis not present

## 2012-04-05 DIAGNOSIS — N400 Enlarged prostate without lower urinary tract symptoms: Secondary | ICD-10-CM | POA: Diagnosis not present

## 2012-04-05 DIAGNOSIS — R350 Frequency of micturition: Secondary | ICD-10-CM | POA: Diagnosis not present

## 2012-05-20 DIAGNOSIS — M169 Osteoarthritis of hip, unspecified: Secondary | ICD-10-CM | POA: Diagnosis not present

## 2012-06-29 ENCOUNTER — Other Ambulatory Visit (HOSPITAL_COMMUNITY): Payer: Self-pay | Admitting: Internal Medicine

## 2012-06-29 DIAGNOSIS — R0989 Other specified symptoms and signs involving the circulatory and respiratory systems: Secondary | ICD-10-CM

## 2012-06-29 DIAGNOSIS — I10 Essential (primary) hypertension: Secondary | ICD-10-CM | POA: Diagnosis not present

## 2012-07-06 ENCOUNTER — Ambulatory Visit (HOSPITAL_COMMUNITY)
Admission: RE | Admit: 2012-07-06 | Discharge: 2012-07-06 | Disposition: A | Discharge status: Home / Self Care | Payer: Medicare Other | Source: Ambulatory Visit | Attending: Internal Medicine | Admitting: Internal Medicine

## 2012-07-06 ENCOUNTER — Other Ambulatory Visit (HOSPITAL_COMMUNITY): Payer: Self-pay | Admitting: Internal Medicine

## 2012-07-06 DIAGNOSIS — I658 Occlusion and stenosis of other precerebral arteries: Secondary | ICD-10-CM | POA: Insufficient documentation

## 2012-07-06 DIAGNOSIS — R0989 Other specified symptoms and signs involving the circulatory and respiratory systems: Secondary | ICD-10-CM

## 2012-07-28 ENCOUNTER — Other Ambulatory Visit (HOSPITAL_COMMUNITY): Payer: Self-pay

## 2012-07-28 DIAGNOSIS — G4730 Sleep apnea, unspecified: Secondary | ICD-10-CM

## 2012-08-10 ENCOUNTER — Ambulatory Visit: Payer: Medicare Other | Attending: Internal Medicine | Admitting: Sleep Medicine

## 2012-08-10 VITALS — Ht 70.0 in | Wt 265.0 lb

## 2012-08-10 DIAGNOSIS — G4733 Obstructive sleep apnea (adult) (pediatric): Secondary | ICD-10-CM | POA: Insufficient documentation

## 2012-08-10 DIAGNOSIS — Z6838 Body mass index (BMI) 38.0-38.9, adult: Secondary | ICD-10-CM | POA: Diagnosis not present

## 2012-08-10 DIAGNOSIS — G4730 Sleep apnea, unspecified: Secondary | ICD-10-CM

## 2012-08-10 DIAGNOSIS — G4710 Hypersomnia, unspecified: Secondary | ICD-10-CM | POA: Diagnosis not present

## 2012-08-11 DIAGNOSIS — Z961 Presence of intraocular lens: Secondary | ICD-10-CM | POA: Diagnosis not present

## 2012-08-11 DIAGNOSIS — H2510 Age-related nuclear cataract, unspecified eye: Secondary | ICD-10-CM | POA: Diagnosis not present

## 2012-08-11 DIAGNOSIS — H5200 Hypermetropia, unspecified eye: Secondary | ICD-10-CM | POA: Diagnosis not present

## 2012-08-11 DIAGNOSIS — H26499 Other secondary cataract, unspecified eye: Secondary | ICD-10-CM | POA: Diagnosis not present

## 2012-08-24 NOTE — Procedures (Signed)
[facility] Caleb Hoes A. Gerilyn Pilgrim, MD     [email or web address]        Caleb Wolf, Caleb Wolf                  ACCOUNT NO.:  [id number]  MEDICAL RECORD NO.:  [id number]          PATIENT TYPE:  OUT  LOCATION:  SLEEP LAB                     FACILITY:  [facility]  PHYSICIAN:  Tona Qualley A. Gerilyn Wolf, [name] DATE OF BIRTH:  12-23-1940  DATE OF STUDY:  08/10/2012                           NOCTURNAL POLYSOMNOGRAM  REFERRING PHYSICIAN:  [name], MD  INDICATION:  A 72 year old man who presents with hypersomnia, fatigue, and snoring.  The study is being done to evaluate for obstructive sleep apnea syndrome.   MEDICATIONS:  Verapamil, simvastatin, clonidine, lisinopril, hydrochlorothiazide, finasteride, aspirin, and metoprolol.  EPWORTH SLEEPINESS SCALE:  11.  BMI 38.  ARCHITECTURAL SUMMARY:  This is a split night recording with initial portion being a diagnostic and the second portion a titration recording. The total recording time is 423 minutes.  Sleep efficiency 61%.  Sleep latency 17 minutes.  REM latency 266 minutes.  RESPIRATORY SUMMARY:  Baseline oxygen saturation 95%, lowest saturation 89% during non-REM sleep.  Diagnostic AHI is 15.  The patient was placed on positive pressure and titrated between 5 and 10.  Optimal pressure is 10 with resolution of obstructive events.  LIMB MOVEMENT SUMMARY:  PLM index is 0.  ELECTROCARDIOGRAM SUMMARY:  Average heart rate is 56 with no significant dysrhythmias observed.  IMPRESSION:  Moderate obstructive sleep apnea syndrome, which responds well to a CPAP of 10.  Thank you for this referral.   [name], [name]    KAD/MEDQ  D:  08/24/2012 19:04:12  T:  08/24/2012 19:16:31  Job:  [id number]

## 2012-09-06 DIAGNOSIS — D485 Neoplasm of uncertain behavior of skin: Secondary | ICD-10-CM | POA: Diagnosis not present

## 2012-09-06 DIAGNOSIS — L851 Acquired keratosis [keratoderma] palmaris et plantaris: Secondary | ICD-10-CM | POA: Diagnosis not present

## 2012-09-06 DIAGNOSIS — L821 Other seborrheic keratosis: Secondary | ICD-10-CM | POA: Diagnosis not present

## 2012-09-06 DIAGNOSIS — L570 Actinic keratosis: Secondary | ICD-10-CM | POA: Diagnosis not present

## 2012-09-17 DIAGNOSIS — H31019 Macula scars of posterior pole (postinflammatory) (post-traumatic), unspecified eye: Secondary | ICD-10-CM | POA: Diagnosis not present

## 2012-09-21 ENCOUNTER — Encounter: Payer: Self-pay | Admitting: Pulmonary Disease

## 2012-09-21 ENCOUNTER — Ambulatory Visit (INDEPENDENT_AMBULATORY_CARE_PROVIDER_SITE_OTHER): Payer: Medicare Other | Admitting: Pulmonary Disease

## 2012-09-21 VITALS — BP 112/80 | HR 58 | Temp 97.9°F | Ht 71.0 in | Wt 280.0 lb

## 2012-09-21 DIAGNOSIS — G4733 Obstructive sleep apnea (adult) (pediatric): Secondary | ICD-10-CM | POA: Insufficient documentation

## 2012-09-21 NOTE — Progress Notes (Signed)
Subjective:    Patient ID: Caleb Wolf, male    DOB: 14-Jul-1940, 72 y.o.   MRN: [id number]  HPI The patient is a 72 year old male who have been asked to see for management of obstructive sleep apnea.  He has had a recent sleep study, which showed an AHI of 15 events per hour and an optimal CPAP pressure around 10 cm of water.  The patient has been noted to have mild snoring by his bed partner, as well as an abnormal breathing pattern during sleep.  He has frequent awakenings at night, and is only rested 50% of the mornings.  He has definite inappropriate daytime sleepiness with periods of inactivity, and can doze in the evening watching television.  He denies any sleepiness with driving long or short distances.  The patient states that his weight is up about 30 pounds over the last 2 years, and his Epworth sleepiness score today is 13.    Sleep Questionnaire What time do you typically go to bed?( Between what hours) 10pm 10pm at 1546 on 09/21/12 by Nita Sells, CMA How long does it take you to fall asleep? within within at 1546 on 09/21/12 by Marjo Bicker Mabe, CMA How many times during the night do you wake up? 33 2-3 at 1546 on 09/21/12 by Nita Sells, CMA What time do you get out of bed to start your day? [id number] up at 5am---pt lays in recliner and may doze off until 7am at 1546 on 09/21/12 by Nita Sells, CMA Do you drive or operate heavy machinery in your occupation? No No at 1546 on 09/21/12 by Nita Sells, CMA How much has your weight changed (up or down) over the past two years? (In pounds) 30 lb (13.608 kg)30 lb (13.608 kg) up and down--varies at 1546 on 09/21/12 by Nita Sells, CMA Have you ever had a sleep study before? Yes Yes at 1546 on 09/21/12 by Nita Sells, CMA If yes, location of study? [facility] [place] at 1546 on 09/21/12 by Nita Sells, CMA If yes, date of study? 07/17/2012 07/17/2012 at 1546 on 09/21/12 by Nita Sells, CMA Do  you currently use CPAP? No No at 1546 on 09/21/12 by Marjo Bicker Mabe, CMA Do you wear oxygen at any time? No No at 1546 on 09/21/12 by Marjo Bicker Mabe, CMA   Review of Systems  Constitutional: Negative for fever and unexpected weight change.  HENT: Positive for congestion. Negative for ear pain, nosebleeds, sore throat, rhinorrhea, sneezing, trouble swallowing, dental problem, postnasal drip and sinus pressure.   Eyes: Negative for redness and itching.  Respiratory: Positive for shortness of breath. Negative for cough, chest tightness and wheezing.   Cardiovascular: Positive for leg swelling ( feet). Negative for palpitations.  Gastrointestinal: Negative for nausea and vomiting.       ACID REFLUX   Genitourinary: Negative for dysuria.  Musculoskeletal: Positive for joint swelling ( hand).  Skin: Negative for rash.  Neurological: Negative for headaches.  Hematological: Does not bruise/bleed easily.  Psychiatric/Behavioral: Negative for dysphoric mood. The patient is not nervous/anxious.        Objective:   Physical Exam Constitutional:  Obese male, no acute distress  HENT:  Nares patent without discharge  Oropharynx without exudate, palate and uvula are mildly elongated.  Eyes:  Perrla, eomi, no scleral icterus  Neck:  No JVD, no TMG  Cardiovascular:  Normal rate, regular rhythm, no rubs or gallops.  2/6  sem        Intact distal pulses but diminished  Pulmonary :  Normal breath sounds, no stridor or respiratory distress   No rales, rhonchi, or wheezing  Abdominal:  Soft, nondistended, bowel sounds present.  No tenderness noted.   Musculoskeletal:  1-2+ lower extremity edema noted.  Lymph Nodes:  No cervical lymphadenopathy noted  Skin:  No cyanosis noted  Neurologic:  Alert, appropriate, moves all 4 extremities without obvious deficit.         Assessment & Plan:

## 2012-09-21 NOTE — Assessment & Plan Note (Signed)
The patient has mild to moderate obstructive sleep apnea by his recent sleep study, and is clearly symptomatic with that night and during the day.  I have had a long conversation with him about sleep apnea, including its impact to his quality of life and cardiovascular health.  I have outlined a conservative option of a weight loss trial of the next 6 months, versus more aggressive treatment with either surgery, dental appliance, or CPAP.  After a long discussion, the patient has decided to try CPAP while he is working on weight loss. I will set the patient up on cpap at a moderate pressure level to allow for desensitization, and will troubleshoot the device over the next 4-6weeks if needed.  The pt is to call me if having issues with tolerance.  Will then optimize the pressure once patient is able to wear cpap on a consistent basis.

## 2012-09-21 NOTE — Patient Instructions (Addendum)
Will start on cpap at a moderate pressure level.  Please call if having issues with tolerance Work on weight loss followup with me in 6weeks.  

## 2012-09-27 ENCOUNTER — Telehealth: Payer: Self-pay | Admitting: Pulmonary Disease

## 2012-09-27 DIAGNOSIS — L570 Actinic keratosis: Secondary | ICD-10-CM | POA: Diagnosis not present

## 2012-09-27 NOTE — Telephone Encounter (Signed)
Cell # [phone]

## 2012-09-27 NOTE — Telephone Encounter (Signed)
Error.Caleb Wolf ° °

## 2012-09-27 NOTE — Telephone Encounter (Signed)
atc pt NA WCB

## 2012-09-27 NOTE — Telephone Encounter (Signed)
Pt called back. He stated he received a call from [facility] stating medicare will not pay for his CPAP since he did not have anything in his OV notes from his referring physician stating why he needed a sleep study. So he will need another study and pt does not want this. I advised pt will call [facility].   I called [facility] and spoke with [facility]. I was advised that since pt's PCP did not document anything in his notes stating why pt needed a sleep study then medicare will not pay for CPAP. He will need to have another OV stating why he needs a sleep study and will need to have another sleep study per [facility]. Please advise PCC's thanks

## 2012-09-28 NOTE — Telephone Encounter (Signed)
Patient calling back about the same as below.  Please call.  [phone]

## 2012-09-28 NOTE — Telephone Encounter (Signed)
Spoke to pt he is to call [email or web address]  and let her explain what can be done to see if medicare will cover the cpap in dr fagan did not document in his notes need for npsg T[facility] Sos

## 2012-09-28 NOTE — Telephone Encounter (Signed)
Spoke with pt and informed that we are waiting for a call back from [facility] to see if she has contacted Dr Alonza Smoker office to see if they have any other office notes that would state pt's need for sleep study.

## 2012-10-04 DIAGNOSIS — R6882 Decreased libido: Secondary | ICD-10-CM | POA: Diagnosis not present

## 2012-10-04 DIAGNOSIS — N320 Bladder-neck obstruction: Secondary | ICD-10-CM | POA: Diagnosis not present

## 2012-10-04 DIAGNOSIS — N3941 Urge incontinence: Secondary | ICD-10-CM | POA: Insufficient documentation

## 2012-10-04 DIAGNOSIS — N400 Enlarged prostate without lower urinary tract symptoms: Secondary | ICD-10-CM | POA: Diagnosis not present

## 2012-10-04 DIAGNOSIS — E291 Testicular hypofunction: Secondary | ICD-10-CM | POA: Diagnosis not present

## 2012-10-08 ENCOUNTER — Telehealth: Payer: Self-pay | Admitting: Pulmonary Disease

## 2012-10-08 NOTE — Telephone Encounter (Signed)
Pt decided not to leave a message.  Caleb Wolf ° °

## 2012-10-19 DIAGNOSIS — I2510 Atherosclerotic heart disease of native coronary artery without angina pectoris: Secondary | ICD-10-CM | POA: Diagnosis not present

## 2012-10-19 DIAGNOSIS — I10 Essential (primary) hypertension: Secondary | ICD-10-CM | POA: Diagnosis not present

## 2012-10-19 DIAGNOSIS — Z125 Encounter for screening for malignant neoplasm of prostate: Secondary | ICD-10-CM | POA: Diagnosis not present

## 2012-10-19 DIAGNOSIS — M109 Gout, unspecified: Secondary | ICD-10-CM | POA: Diagnosis not present

## 2012-10-19 DIAGNOSIS — M1990 Unspecified osteoarthritis, unspecified site: Secondary | ICD-10-CM | POA: Diagnosis not present

## 2012-10-19 DIAGNOSIS — Z79899 Other long term (current) drug therapy: Secondary | ICD-10-CM | POA: Diagnosis not present

## 2012-10-26 DIAGNOSIS — I10 Essential (primary) hypertension: Secondary | ICD-10-CM | POA: Diagnosis not present

## 2012-10-26 DIAGNOSIS — M109 Gout, unspecified: Secondary | ICD-10-CM | POA: Diagnosis not present

## 2012-10-26 DIAGNOSIS — Z125 Encounter for screening for malignant neoplasm of prostate: Secondary | ICD-10-CM | POA: Diagnosis not present

## 2012-10-26 DIAGNOSIS — I2510 Atherosclerotic heart disease of native coronary artery without angina pectoris: Secondary | ICD-10-CM | POA: Diagnosis not present

## 2012-10-26 DIAGNOSIS — M1990 Unspecified osteoarthritis, unspecified site: Secondary | ICD-10-CM | POA: Diagnosis not present

## 2012-10-26 DIAGNOSIS — Z79899 Other long term (current) drug therapy: Secondary | ICD-10-CM | POA: Diagnosis not present

## 2012-11-01 DIAGNOSIS — E785 Hyperlipidemia, unspecified: Secondary | ICD-10-CM | POA: Diagnosis not present

## 2012-11-01 DIAGNOSIS — G4730 Sleep apnea, unspecified: Secondary | ICD-10-CM | POA: Diagnosis not present

## 2012-11-01 DIAGNOSIS — I10 Essential (primary) hypertension: Secondary | ICD-10-CM | POA: Diagnosis not present

## 2012-11-01 DIAGNOSIS — I498 Other specified cardiac arrhythmias: Secondary | ICD-10-CM | POA: Diagnosis not present

## 2012-11-02 ENCOUNTER — Ambulatory Visit (INDEPENDENT_AMBULATORY_CARE_PROVIDER_SITE_OTHER): Payer: Medicare Other | Admitting: Pulmonary Disease

## 2012-11-02 ENCOUNTER — Encounter: Payer: Self-pay | Admitting: Pulmonary Disease

## 2012-11-02 ENCOUNTER — Ambulatory Visit: Payer: Medicare Other | Admitting: Pulmonary Disease

## 2012-11-02 VITALS — BP 124/62 | HR 50 | Temp 98.0°F | Ht 69.5 in | Wt 271.2 lb

## 2012-11-02 DIAGNOSIS — M169 Osteoarthritis of hip, unspecified: Secondary | ICD-10-CM | POA: Diagnosis not present

## 2012-11-02 DIAGNOSIS — G4733 Obstructive sleep apnea (adult) (pediatric): Secondary | ICD-10-CM | POA: Diagnosis not present

## 2012-11-02 DIAGNOSIS — M1710 Unilateral primary osteoarthritis, unspecified knee: Secondary | ICD-10-CM | POA: Diagnosis not present

## 2012-11-02 NOTE — Patient Instructions (Addendum)
Continue on cpap, and keep up with mask cushion changes and supplies Work on weight loss followup with me in 6mos.

## 2012-11-02 NOTE — Progress Notes (Signed)
  Subjective:    Patient ID: Caleb Wolf, male    DOB: 1940-07-05, 72 y.o.   MRN: [id number]  HPI Patient comes in today for followup of his obstructive sleep apnea.  He was started on CPAP at the last visit, and has done very well with the device.  His download today shows excellent compliance, adequate control of his AHI, and only mild leak.  He has since changed masks and feels that he has a much better fit.  He feels that he is sleeping much better, and has improved daytime alertness.  His wife has not heard breakthrough snoring.   Review of Systems  Constitutional: Negative for fever and unexpected weight change.  HENT: Negative for ear pain, nosebleeds, congestion, sore throat, rhinorrhea, sneezing, trouble swallowing, dental problem, postnasal drip and sinus pressure.   Eyes: Negative for redness and itching.  Respiratory: Negative for cough, chest tightness, shortness of breath and wheezing.   Cardiovascular: Negative for palpitations and leg swelling.  Gastrointestinal: Negative for nausea and vomiting.  Genitourinary: Negative for dysuria.  Musculoskeletal: Negative for joint swelling.  Skin: Negative for rash.  Neurological: Negative for headaches.  Hematological: Does not bruise/bleed easily.  Psychiatric/Behavioral: Negative for dysphoric mood. The patient is not nervous/anxious.        Objective:   Physical Exam Obese male in no acute distress Nose without purulence or discharge noted No skin breakdown or pressure necrosis from the CPAP mask Neck without lymphadenopathy or thyromegaly Lower extremities without edema, no cyanosis Alert and oriented, does not appear to be sleepy, moves all 4 extremities.       Assessment & Plan:

## 2012-11-02 NOTE — Assessment & Plan Note (Signed)
The pt is doing very well on cpap, and feels he is sleeping much better with improved daytime alertness.  He is having no mask or pressure issues currently.  I have asked the pt to keep up with mask changes and supplies, and to work on weight loss. He will f/u with me in 6mos.

## 2013-03-04 DIAGNOSIS — M109 Gout, unspecified: Secondary | ICD-10-CM | POA: Diagnosis not present

## 2013-03-04 DIAGNOSIS — I10 Essential (primary) hypertension: Secondary | ICD-10-CM | POA: Diagnosis not present

## 2013-03-08 DIAGNOSIS — Z23 Encounter for immunization: Secondary | ICD-10-CM | POA: Diagnosis not present

## 2013-03-30 DIAGNOSIS — L819 Disorder of pigmentation, unspecified: Secondary | ICD-10-CM | POA: Diagnosis not present

## 2013-03-30 DIAGNOSIS — D485 Neoplasm of uncertain behavior of skin: Secondary | ICD-10-CM | POA: Diagnosis not present

## 2013-03-30 DIAGNOSIS — L570 Actinic keratosis: Secondary | ICD-10-CM | POA: Diagnosis not present

## 2013-03-30 DIAGNOSIS — L821 Other seborrheic keratosis: Secondary | ICD-10-CM | POA: Diagnosis not present

## 2013-04-04 DIAGNOSIS — R6882 Decreased libido: Secondary | ICD-10-CM | POA: Diagnosis not present

## 2013-04-04 DIAGNOSIS — E291 Testicular hypofunction: Secondary | ICD-10-CM | POA: Diagnosis not present

## 2013-04-04 DIAGNOSIS — N320 Bladder-neck obstruction: Secondary | ICD-10-CM | POA: Diagnosis not present

## 2013-04-04 DIAGNOSIS — R319 Hematuria, unspecified: Secondary | ICD-10-CM | POA: Diagnosis not present

## 2013-04-04 DIAGNOSIS — N400 Enlarged prostate without lower urinary tract symptoms: Secondary | ICD-10-CM | POA: Diagnosis not present

## 2013-04-04 DIAGNOSIS — N3941 Urge incontinence: Secondary | ICD-10-CM | POA: Diagnosis not present

## 2013-04-07 ENCOUNTER — Other Ambulatory Visit: Payer: Self-pay

## 2013-04-19 DIAGNOSIS — N3941 Urge incontinence: Secondary | ICD-10-CM | POA: Diagnosis not present

## 2013-05-03 ENCOUNTER — Telehealth: Payer: Self-pay | Admitting: Pulmonary Disease

## 2013-05-03 NOTE — Telephone Encounter (Signed)
I spoke with the pt and he states he has a resmed machine so I advised he can bring in the card and we may be able to get info off of that. I advsied if not we can get in contact with his DME.  Carron Curie, CMA

## 2013-05-04 ENCOUNTER — Encounter: Payer: Self-pay | Admitting: Pulmonary Disease

## 2013-05-04 ENCOUNTER — Ambulatory Visit (INDEPENDENT_AMBULATORY_CARE_PROVIDER_SITE_OTHER): Payer: Medicare Other | Admitting: Pulmonary Disease

## 2013-05-04 VITALS — BP 124/60 | HR 52 | Temp 98.0°F | Ht 69.5 in | Wt 267.6 lb

## 2013-05-04 DIAGNOSIS — G4733 Obstructive sleep apnea (adult) (pediatric): Secondary | ICD-10-CM | POA: Diagnosis not present

## 2013-05-04 NOTE — Progress Notes (Signed)
   Subjective:    Patient ID: Caleb Wolf, male    DOB: [birth year], 72 y.o.   MRN: [id number]  HPI The pt comes in today for f/u of his osa.  He is wearing cpap compliantly by his download, and his AHI is well controlled.  He has some sleepiness in the afternoon if he is quiet, but otherwise feels he is doing well.  He is having no significant mask leak or pressure issues. He feels that his sleep is adequate, and his daytime alertness is reasonable. He is asking about a dental appliance.   Review of Systems  Constitutional: Negative for fever and unexpected weight change.  HENT: Negative for congestion, dental problem, ear pain, nosebleeds, postnasal drip, rhinorrhea, sinus pressure, sneezing, sore throat and trouble swallowing.   Eyes: Negative for redness and itching.  Respiratory: Negative for cough, chest tightness, shortness of breath and wheezing.   Cardiovascular: Negative for palpitations and leg swelling.  Gastrointestinal: Negative for nausea and vomiting.  Genitourinary: Negative for dysuria.  Musculoskeletal: Negative for joint swelling.  Skin: Negative for rash.  Neurological: Negative for headaches.  Hematological: Does not bruise/bleed easily.  Psychiatric/Behavioral: Negative for dysphoric mood. The patient is not nervous/anxious.        Objective:   Physical Exam Ow male in nad Nose without purulence or d/c noted. No skin breakdown or pressure necrosis from cpap mask. Neck without LN or TMG LE without edema, no cyanosis Alert, does not appear sleepy, moves all 4.        Assessment & Plan:

## 2013-05-04 NOTE — Patient Instructions (Signed)
Continue with cpap, and work on weight loss Can consider a dental appliance for treatment of your sleep apnea. followup with me in one year if doing well.

## 2013-05-04 NOTE — Assessment & Plan Note (Signed)
Overall, the patient seems to be doing fairly well with CPAP. He has some afternoon alertness issues, however this is not uncommon. I have stressed to him the importance of aggressive weight loss, and this will help his sleep more than anything else. I have had a discussion with him about dental appliances, and explained that his sleep apnea is in a range that would respond well if he chose to go with this therapy.

## 2013-05-30 DIAGNOSIS — N400 Enlarged prostate without lower urinary tract symptoms: Secondary | ICD-10-CM | POA: Diagnosis not present

## 2013-05-30 DIAGNOSIS — E291 Testicular hypofunction: Secondary | ICD-10-CM | POA: Diagnosis not present

## 2013-05-30 DIAGNOSIS — N3941 Urge incontinence: Secondary | ICD-10-CM | POA: Diagnosis not present

## 2013-06-02 DIAGNOSIS — I219 Acute myocardial infarction, unspecified: Secondary | ICD-10-CM

## 2013-06-02 HISTORY — DX: Acute myocardial infarction, unspecified: I21.9

## 2013-07-06 DIAGNOSIS — R279 Unspecified lack of coordination: Secondary | ICD-10-CM | POA: Diagnosis not present

## 2013-07-06 DIAGNOSIS — N3941 Urge incontinence: Secondary | ICD-10-CM | POA: Diagnosis not present

## 2013-07-06 DIAGNOSIS — M6281 Muscle weakness (generalized): Secondary | ICD-10-CM | POA: Diagnosis not present

## 2013-07-06 DIAGNOSIS — N393 Stress incontinence (female) (male): Secondary | ICD-10-CM | POA: Diagnosis not present

## 2013-07-07 ENCOUNTER — Other Ambulatory Visit (HOSPITAL_COMMUNITY): Payer: Self-pay | Admitting: Internal Medicine

## 2013-07-07 DIAGNOSIS — I10 Essential (primary) hypertension: Secondary | ICD-10-CM | POA: Diagnosis not present

## 2013-07-07 DIAGNOSIS — R0989 Other specified symptoms and signs involving the circulatory and respiratory systems: Secondary | ICD-10-CM

## 2013-07-07 DIAGNOSIS — M722 Plantar fascial fibromatosis: Secondary | ICD-10-CM | POA: Diagnosis not present

## 2013-07-07 DIAGNOSIS — I2510 Atherosclerotic heart disease of native coronary artery without angina pectoris: Secondary | ICD-10-CM | POA: Diagnosis not present

## 2013-07-11 DIAGNOSIS — E291 Testicular hypofunction: Secondary | ICD-10-CM | POA: Diagnosis not present

## 2013-07-12 ENCOUNTER — Ambulatory Visit (HOSPITAL_COMMUNITY)
Admission: RE | Admit: 2013-07-12 | Discharge: 2013-07-12 | Disposition: A | Discharge status: Home / Self Care | Payer: Medicare Other | Source: Ambulatory Visit | Attending: Internal Medicine | Admitting: Internal Medicine

## 2013-07-12 DIAGNOSIS — I658 Occlusion and stenosis of other precerebral arteries: Secondary | ICD-10-CM | POA: Insufficient documentation

## 2013-07-12 DIAGNOSIS — I6529 Occlusion and stenosis of unspecified carotid artery: Secondary | ICD-10-CM | POA: Insufficient documentation

## 2013-07-12 DIAGNOSIS — R0989 Other specified symptoms and signs involving the circulatory and respiratory systems: Secondary | ICD-10-CM | POA: Diagnosis not present

## 2013-07-13 DIAGNOSIS — N393 Stress incontinence (female) (male): Secondary | ICD-10-CM | POA: Diagnosis not present

## 2013-07-13 DIAGNOSIS — M6281 Muscle weakness (generalized): Secondary | ICD-10-CM | POA: Diagnosis not present

## 2013-07-13 DIAGNOSIS — N3941 Urge incontinence: Secondary | ICD-10-CM | POA: Diagnosis not present

## 2013-07-13 DIAGNOSIS — R279 Unspecified lack of coordination: Secondary | ICD-10-CM | POA: Diagnosis not present

## 2013-07-18 ENCOUNTER — Other Ambulatory Visit: Payer: Self-pay | Admitting: Vascular Surgery

## 2013-07-18 DIAGNOSIS — Z01810 Encounter for preprocedural cardiovascular examination: Secondary | ICD-10-CM

## 2013-07-18 DIAGNOSIS — I6529 Occlusion and stenosis of unspecified carotid artery: Secondary | ICD-10-CM

## 2013-07-20 DIAGNOSIS — N3941 Urge incontinence: Secondary | ICD-10-CM | POA: Diagnosis not present

## 2013-07-20 DIAGNOSIS — N393 Stress incontinence (female) (male): Secondary | ICD-10-CM | POA: Diagnosis not present

## 2013-07-20 DIAGNOSIS — R279 Unspecified lack of coordination: Secondary | ICD-10-CM | POA: Diagnosis not present

## 2013-07-20 DIAGNOSIS — M6281 Muscle weakness (generalized): Secondary | ICD-10-CM | POA: Diagnosis not present

## 2013-08-01 ENCOUNTER — Encounter: Payer: Self-pay | Admitting: Vascular Surgery

## 2013-08-02 ENCOUNTER — Encounter: Payer: Self-pay | Admitting: Vascular Surgery

## 2013-08-02 ENCOUNTER — Ambulatory Visit (HOSPITAL_COMMUNITY)
Admission: RE | Admit: 2013-08-02 | Discharge: 2013-08-02 | Disposition: A | Discharge status: Home / Self Care | Payer: Medicare Other | Source: Ambulatory Visit | Attending: Vascular Surgery | Admitting: Vascular Surgery

## 2013-08-02 ENCOUNTER — Ambulatory Visit (INDEPENDENT_AMBULATORY_CARE_PROVIDER_SITE_OTHER): Payer: Medicare Other | Admitting: Vascular Surgery

## 2013-08-02 VITALS — BP 122/63 | HR 60 | Resp 16 | Ht 70.5 in | Wt 270.0 lb

## 2013-08-02 DIAGNOSIS — I6529 Occlusion and stenosis of unspecified carotid artery: Secondary | ICD-10-CM

## 2013-08-02 DIAGNOSIS — Z01810 Encounter for preprocedural cardiovascular examination: Secondary | ICD-10-CM | POA: Diagnosis not present

## 2013-08-02 DIAGNOSIS — I658 Occlusion and stenosis of other precerebral arteries: Secondary | ICD-10-CM | POA: Diagnosis not present

## 2013-08-02 DIAGNOSIS — R609 Edema, unspecified: Secondary | ICD-10-CM | POA: Insufficient documentation

## 2013-08-02 NOTE — Progress Notes (Signed)
Subjective:     Patient ID: Caleb Wolf, male   DOB: Nov 02, 1940, 73 y.o.   MRN: [id number]  HPI this 73 year old male was referred by Dr. Asencion Noble or carotid occlusive disease. Patient denies any previous stroke, lateralizing weakness, aphasia, amaurosis fugax, diplopia, blurred vision, or syncope. He has had a moderate left ICA stenosis followed with ultrasound of the last 5 years and it has shown progression recently. He was referred for evaluation.  Past Medical History  Diagnosis Date  . PONV (postoperative nausea and vomiting)   . Coronary artery disease   . GERD (gastroesophageal reflux disease)   . Arthritis   . Hyperlipidemia   . Sleep apnea     STOP BANG SCORE 8  . Carotid stenosis     07/04/11- 50-70%bilaterally- stable per Dr Willey Blade note  . Chronic kidney disease     stones, BPH  . Lower leg edema   . Lumbar disc disease   . Hypertension     clearance with OV notes Dr Willey Blade on chart, EKG from 5/12 on chart f    History  Substance Use Topics  . Smoking status: Former Smoker -- 2.00 packs/day for 25 years    Types: Cigarettes    Quit date: 12/01/1986  . Smokeless tobacco: Never Used  . Alcohol Use: Yes     Comment: socially--wine monthly    Family History  Problem Relation Age of Onset  . Allergies Mother   . Heart disease Mother   . Heart disease Father     MI  . Hypertension Mother     Allergies  Allergen Reactions  . Codeine Sulfate Nausea Only    Current outpatient prescriptions:aspirin 81 MG tablet, Take 81 mg by mouth daily., Disp: , Rfl: ;  cloNIDine (CATAPRES) 0.1 MG tablet, Take 0.1 mg by mouth 2 (two) times daily., Disp: , Rfl: ;  finasteride (PROSCAR) 5 MG tablet, Take 5 mg by mouth at bedtime. , Disp: , Rfl: ;  hydrochlorothiazide (HYDRODIURIL) 25 MG tablet, Take 25 mg by mouth daily with breakfast., Disp: , Rfl:  indomethacin (INDOCIN) 50 MG capsule, Take 50 mg by mouth 3 (three) times daily with meals as needed. , Disp: , Rfl: ;  lisinopril  (PRINIVIL,ZESTRIL) 40 MG tablet, Take 40 mg by mouth at bedtime. , Disp: , Rfl: ;  metoprolol tartrate (LOPRESSOR) 25 MG tablet, Take 25 mg by mouth 2 (two) times daily., Disp: , Rfl: ;  naproxen sodium (ANAPROX) 220 MG tablet, Take 220 mg by mouth as needed., Disp: , Rfl:  simvastatin (ZOCOR) 10 MG tablet, Take 10 mg by mouth at bedtime., Disp: , Rfl: ;  TESTIM 50 MG/5GM GEL, Apply as needed up three times weekly, Disp: , Rfl: ;  verapamil (CALAN-SR) 180 MG CR tablet, Take 180 mg by mouth 2 (two) times daily., Disp: , Rfl:   BP 122/63  Pulse 60  Resp 16  Ht 5' 10.5" (1.791 m)  Wt 270 lb (122.471 kg)  BMI 38.18 kg/m2  Body mass index is 38.18 kg/(m^2).     BP 122/63  Pulse 60  Resp 16  Ht 5' 10.5" (1.791 m)  Wt 270 lb (122.471 kg)  BMI 38.18 kg/m2     Gen.-alert and oriented x3 in no apparent distress-obese  HEENT normal for age Lungs no rhonchi or wheezing Cardiovascular regular rhythm no murmurs carotid pulses 3+ palpable -bilateral bruits audible over  carotid arteries Abdomen soft nontender no palpable masses-obese Musculoskeletal free of  major  deformities Skin clear -no rashes Neurologic normal Lower extremities 3+ femoral and dorsalis pedis pulses palpable bilaterally with 1+ edema bilaterally left greater than right     Review of Systems denies chest pain but does have dyspnea on exertion. Has bilateral lower extremity edema with no history of DVT, thrombophlebitis, or stasis ulcer. Does not wear elastic compression stockings on a regular basis to elevate his legs. No hemoptysis, all other systems negative a complete review of systems    Objective:   Physical Exam  Today I reviewed the carotid duplex exam performed at [facility] and compared that the left carotid ultrasound performed in my office. It appears that there is a 75% left ICA stenosis and a 50-60% right ICA stenosis     Assessment:    #1 asymptomatic 75% left ICA stenosis and 50-60% right ICA  stenosis    #2 bilateral lower extremity edema left greater than right no history DVT Plan:     Plan return in 6 months for carotid duplex exam to look for progression of disease Discussed specific symptoms with patient and his wife at length and if he develops any of these symptoms he will be in touch with [place] #3 continue daily aspirin #4 return will obtain bilateral lower extremity reflux study to evaluate for bilateral edema in  lower extremities

## 2013-08-02 NOTE — Addendum Note (Signed)
Addended by: Mena Goes on: 08/02/2013 03:17 PM   Modules accepted: Orders

## 2013-08-11 DIAGNOSIS — N393 Stress incontinence (female) (male): Secondary | ICD-10-CM | POA: Diagnosis not present

## 2013-08-11 DIAGNOSIS — M6281 Muscle weakness (generalized): Secondary | ICD-10-CM | POA: Diagnosis not present

## 2013-08-11 DIAGNOSIS — R279 Unspecified lack of coordination: Secondary | ICD-10-CM | POA: Diagnosis not present

## 2013-08-11 DIAGNOSIS — N3941 Urge incontinence: Secondary | ICD-10-CM | POA: Diagnosis not present

## 2013-08-29 DIAGNOSIS — N400 Enlarged prostate without lower urinary tract symptoms: Secondary | ICD-10-CM | POA: Diagnosis not present

## 2013-08-29 DIAGNOSIS — N529 Male erectile dysfunction, unspecified: Secondary | ICD-10-CM | POA: Diagnosis not present

## 2013-08-29 DIAGNOSIS — E291 Testicular hypofunction: Secondary | ICD-10-CM | POA: Diagnosis not present

## 2013-08-29 DIAGNOSIS — N3941 Urge incontinence: Secondary | ICD-10-CM | POA: Diagnosis not present

## 2013-08-31 DIAGNOSIS — R279 Unspecified lack of coordination: Secondary | ICD-10-CM | POA: Diagnosis not present

## 2013-08-31 DIAGNOSIS — M6281 Muscle weakness (generalized): Secondary | ICD-10-CM | POA: Diagnosis not present

## 2013-08-31 DIAGNOSIS — N393 Stress incontinence (female) (male): Secondary | ICD-10-CM | POA: Diagnosis not present

## 2013-08-31 DIAGNOSIS — N3941 Urge incontinence: Secondary | ICD-10-CM | POA: Diagnosis not present

## 2013-09-27 DIAGNOSIS — N393 Stress incontinence (female) (male): Secondary | ICD-10-CM | POA: Diagnosis not present

## 2013-09-27 DIAGNOSIS — M6281 Muscle weakness (generalized): Secondary | ICD-10-CM | POA: Diagnosis not present

## 2013-09-27 DIAGNOSIS — N3941 Urge incontinence: Secondary | ICD-10-CM | POA: Diagnosis not present

## 2013-09-27 DIAGNOSIS — R279 Unspecified lack of coordination: Secondary | ICD-10-CM | POA: Diagnosis not present

## 2013-10-31 DIAGNOSIS — I10 Essential (primary) hypertension: Secondary | ICD-10-CM | POA: Diagnosis not present

## 2013-10-31 DIAGNOSIS — M1990 Unspecified osteoarthritis, unspecified site: Secondary | ICD-10-CM | POA: Diagnosis not present

## 2013-10-31 DIAGNOSIS — Z125 Encounter for screening for malignant neoplasm of prostate: Secondary | ICD-10-CM | POA: Diagnosis not present

## 2013-10-31 DIAGNOSIS — Z79899 Other long term (current) drug therapy: Secondary | ICD-10-CM | POA: Diagnosis not present

## 2013-10-31 DIAGNOSIS — M109 Gout, unspecified: Secondary | ICD-10-CM | POA: Diagnosis not present

## 2013-10-31 DIAGNOSIS — I2510 Atherosclerotic heart disease of native coronary artery without angina pectoris: Secondary | ICD-10-CM | POA: Diagnosis not present

## 2013-11-07 DIAGNOSIS — Z0000 Encounter for general adult medical examination without abnormal findings: Secondary | ICD-10-CM | POA: Diagnosis not present

## 2013-11-07 DIAGNOSIS — I498 Other specified cardiac arrhythmias: Secondary | ICD-10-CM | POA: Diagnosis not present

## 2013-12-08 DIAGNOSIS — H35349 Macular cyst, hole, or pseudohole, unspecified eye: Secondary | ICD-10-CM | POA: Diagnosis not present

## 2013-12-23 DIAGNOSIS — H2510 Age-related nuclear cataract, unspecified eye: Secondary | ICD-10-CM | POA: Diagnosis not present

## 2013-12-23 DIAGNOSIS — H35379 Puckering of macula, unspecified eye: Secondary | ICD-10-CM | POA: Diagnosis not present

## 2013-12-23 DIAGNOSIS — H26499 Other secondary cataract, unspecified eye: Secondary | ICD-10-CM | POA: Diagnosis not present

## 2014-02-03 ENCOUNTER — Encounter: Payer: Self-pay | Admitting: Vascular Surgery

## 2014-02-07 ENCOUNTER — Ambulatory Visit (INDEPENDENT_AMBULATORY_CARE_PROVIDER_SITE_OTHER): Payer: Medicare Other | Admitting: Vascular Surgery

## 2014-02-07 ENCOUNTER — Ambulatory Visit (INDEPENDENT_AMBULATORY_CARE_PROVIDER_SITE_OTHER)
Admission: RE | Admit: 2014-02-07 | Discharge: 2014-02-07 | Disposition: A | Discharge status: Home / Self Care | Payer: Medicare Other | Source: Ambulatory Visit | Attending: Vascular Surgery | Admitting: Vascular Surgery

## 2014-02-07 ENCOUNTER — Encounter: Payer: Self-pay | Admitting: Vascular Surgery

## 2014-02-07 ENCOUNTER — Ambulatory Visit (HOSPITAL_COMMUNITY)
Admission: RE | Admit: 2014-02-07 | Discharge: 2014-02-07 | Disposition: A | Discharge status: Home / Self Care | Payer: Medicare Other | Source: Ambulatory Visit | Attending: Vascular Surgery | Admitting: Vascular Surgery

## 2014-02-07 VITALS — BP 146/68 | HR 54 | Temp 97.2°F | Resp 18 | Ht 70.0 in | Wt 262.0 lb

## 2014-02-07 DIAGNOSIS — I6529 Occlusion and stenosis of unspecified carotid artery: Secondary | ICD-10-CM | POA: Insufficient documentation

## 2014-02-07 DIAGNOSIS — I872 Venous insufficiency (chronic) (peripheral): Secondary | ICD-10-CM | POA: Diagnosis not present

## 2014-02-07 DIAGNOSIS — R609 Edema, unspecified: Secondary | ICD-10-CM

## 2014-02-07 DIAGNOSIS — Z01818 Encounter for other preprocedural examination: Secondary | ICD-10-CM

## 2014-02-07 NOTE — Addendum Note (Signed)
Addended by: Mena Goes on: 02/07/2014 05:20 PM   Modules accepted: Orders

## 2014-02-07 NOTE — Progress Notes (Signed)
Subjective:     Patient ID: Caleb Wolf, male   DOB: July 26, 1940, 73 y.o.   MRN: [id number]  HPI this 73 year old male is referred by Dr. Asencion Noble 6 months ago for carotid occlusive disease. Patient has no history of stroke or definite lateralizing TIAs. He was referred because of a moderately severe left ICA stenosis which was seen on a study at [facility]. We repeated the left carotid study in our office but not the right since it was not the side of concern and he just had the study a few weeks earlier. We saw a 75-80% left ICA stenosis and the patient reportedly had a 60-70% right ICA stenosis. He has been without neurologic symptoms but 3 weeks ago he had an episode of severe left anterior chest discomfort radiating up into the shoulder and left arm. He had no syncope. He had no neurologic symptoms. He does take one aspirin per day. He has no history of coronary artery disease and does not currently smoke. Symptoms were relieved and he has not seen a physician since that time he is back today for continued followup.  Past Medical History  Diagnosis Date  . PONV (postoperative nausea and vomiting)   . Coronary artery disease   . GERD (gastroesophageal reflux disease)   . Arthritis   . Hyperlipidemia   . Sleep apnea     STOP BANG SCORE 8  . Carotid stenosis     07/04/11- 50-70%bilaterally- stable per Dr Willey Blade note  . Chronic kidney disease     stones, BPH  . Lower leg edema   . Lumbar disc disease   . Hypertension     clearance with OV notes Dr Willey Blade on chart, EKG from 5/12 on chart f    History  Substance Use Topics  . Smoking status: Former Smoker -- 2.00 packs/day for 25 years    Types: Cigarettes    Quit date: 12/01/1986  . Smokeless tobacco: Never Used  . Alcohol Use: Yes     Comment: socially--wine monthly    Family History  Problem Relation Age of Onset  . Allergies Mother   . Heart disease Mother   . Heart disease Father     MI  . Hypertension Mother      Allergies  Allergen Reactions  . Codeine Sulfate Nausea Only    Current outpatient prescriptions:aspirin 81 MG tablet, Take 81 mg by mouth daily., Disp: , Rfl: ;  cloNIDine (CATAPRES) 0.1 MG tablet, Take 0.1 mg by mouth 2 (two) times daily., Disp: , Rfl: ;  finasteride (PROSCAR) 5 MG tablet, Take 5 mg by mouth at bedtime. , Disp: , Rfl: ;  hydrochlorothiazide (HYDRODIURIL) 25 MG tablet, Take 25 mg by mouth daily with breakfast., Disp: , Rfl:  indomethacin (INDOCIN) 50 MG capsule, Take 50 mg by mouth 3 (three) times daily with meals as needed. , Disp: , Rfl: ;  lisinopril (PRINIVIL,ZESTRIL) 40 MG tablet, Take 40 mg by mouth at bedtime. , Disp: , Rfl: ;  metoprolol tartrate (LOPRESSOR) 25 MG tablet, Take 25 mg by mouth 2 (two) times daily., Disp: , Rfl: ;  naproxen sodium (ANAPROX) 220 MG tablet, Take 220 mg by mouth as needed., Disp: , Rfl:  simvastatin (ZOCOR) 10 MG tablet, Take 10 mg by mouth at bedtime., Disp: , Rfl: ;  TESTIM 50 MG/5GM GEL, Apply as needed up three times weekly, Disp: , Rfl: ;  verapamil (CALAN-SR) 180 MG CR tablet, Take 180 mg by mouth 2 (  two) times daily., Disp: , Rfl:   BP 146/68  Pulse 54  Temp(Src) 97.2 F (36.2 C) (Oral)  Resp 18  Ht 5\' 10"  (1.778 m)  Wt 262 lb (118.842 kg)  BMI 37.59 kg/m2  SpO2 96%  Body mass index is 37.59 kg/(m^2).          Review of Systems had one episode of chest discomfort which is noted above. Denies other symptoms other than esophageal reflux and bilateral lower extremity edema for several years left worse than right. Other systems negative and a complete review of systems    Objective:   Physical Exam BP 146/68  Pulse 54  Temp(Src) 97.2 F (36.2 C) (Oral)  Resp 18  Ht 5\' 10"  (1.778 m)  Wt 262 lb (118.842 kg)  BMI 37.59 kg/m2  SpO2 96%  Gen.-alert and oriented x3 in no apparent distress HEENT normal for age Lungs no rhonchi or wheezing Cardiovascular regular rhythm no murmurs carotid pulse absent on right 3+ on  the left. Soft bruit on the left. No bruit on the right. Abdomen soft nontender no palpable masses-obese  Musculoskeletal free of  major deformities Skin clear -no rashes Neurologic normal Lower extremities 3+ femoral and dorsalis pedis pulses palpable bilaterally with 1+ edema on the right 2+ edema on the left.  Today ordered carotid duplex exam which I reviewed and interpreted. Patient has had progression of his left ICA stenosis which now exceeds 80% in severity. Also-surprisingly he now has right common and internal carotid occlusion. We have no previous study at our lab to compare this to.       Assessment:     #1 greater than 80% left ICA stenosis with right ICA and CCA occlusion-asymptomatic #2 possible recent episode of angina pectoris with left anterior chest pain-no history coronary artery disease    Plan:     Patient needs cardiac clearance. Will get nuclear stress test ordered an appointment with cardiologist as soon as possible and proceed with left carotid endarterectomy once cardiac clearance has been achieved. Risks and benefits of carotid surgery thoroughly discussed with patient and he would like to proceed as soon as possible. Have tentatively scheduled surgery for next Wednesday, September 16

## 2014-02-08 ENCOUNTER — Encounter (HOSPITAL_COMMUNITY): Payer: Self-pay | Admitting: Pharmacy Technician

## 2014-02-09 ENCOUNTER — Encounter: Payer: Self-pay | Admitting: Cardiovascular Disease

## 2014-02-09 ENCOUNTER — Encounter: Payer: Self-pay | Admitting: *Deleted

## 2014-02-09 ENCOUNTER — Ambulatory Visit (INDEPENDENT_AMBULATORY_CARE_PROVIDER_SITE_OTHER): Payer: Medicare Other | Admitting: Cardiovascular Disease

## 2014-02-09 ENCOUNTER — Other Ambulatory Visit: Payer: Self-pay

## 2014-02-09 VITALS — BP 118/68 | HR 52 | Ht 72.0 in | Wt 279.0 lb

## 2014-02-09 DIAGNOSIS — M7989 Other specified soft tissue disorders: Secondary | ICD-10-CM

## 2014-02-09 DIAGNOSIS — E785 Hyperlipidemia, unspecified: Secondary | ICD-10-CM

## 2014-02-09 DIAGNOSIS — I6523 Occlusion and stenosis of bilateral carotid arteries: Secondary | ICD-10-CM

## 2014-02-09 DIAGNOSIS — I10 Essential (primary) hypertension: Secondary | ICD-10-CM | POA: Diagnosis not present

## 2014-02-09 DIAGNOSIS — R079 Chest pain, unspecified: Secondary | ICD-10-CM | POA: Insufficient documentation

## 2014-02-09 DIAGNOSIS — R0609 Other forms of dyspnea: Secondary | ICD-10-CM

## 2014-02-09 DIAGNOSIS — Z01818 Encounter for other preprocedural examination: Secondary | ICD-10-CM

## 2014-02-09 DIAGNOSIS — R0989 Other specified symptoms and signs involving the circulatory and respiratory systems: Secondary | ICD-10-CM

## 2014-02-09 DIAGNOSIS — G4730 Sleep apnea, unspecified: Secondary | ICD-10-CM

## 2014-02-09 DIAGNOSIS — I8390 Asymptomatic varicose veins of unspecified lower extremity: Secondary | ICD-10-CM

## 2014-02-09 DIAGNOSIS — I6529 Occlusion and stenosis of unspecified carotid artery: Secondary | ICD-10-CM

## 2014-02-09 DIAGNOSIS — Z136 Encounter for screening for cardiovascular disorders: Secondary | ICD-10-CM | POA: Diagnosis not present

## 2014-02-09 DIAGNOSIS — I658 Occlusion and stenosis of other precerebral arteries: Secondary | ICD-10-CM

## 2014-02-09 MED ORDER — METOPROLOL TARTRATE 25 MG PO TABS
12.5000 mg | ORAL_TABLET | Freq: Two times a day (BID) | ORAL | Status: DC
Start: 2014-02-09 — End: 2014-02-14

## 2014-02-09 NOTE — Addendum Note (Signed)
Addended by: Laurine Blazer on: 02/09/2014 04:14 PM   Modules accepted: Orders

## 2014-02-09 NOTE — Patient Instructions (Addendum)
   Decrease Metoprolol to 12.5mg  twice a day  Continue all other medications.   Your physician has requested that you have an echocardiogram. Echocardiography is a painless test that uses sound waves to create images of your heart. It provides your doctor with information about the size and shape of your heart and how well your heart's chambers and valves are working. This procedure takes approximately one hour. There are no restrictions for this procedure. Your physician has requested that you have a lexiscan myoview. For further information please visit [email or web address]. Please follow instruction sheet, as given. Office will contact with results via phone or letter.   Follow up on February 15, 2014 at 3pm in [place] with Dr. Bronson Ing.

## 2014-02-09 NOTE — Progress Notes (Signed)
Patient ID: Caleb Wolf, male   DOB: May 15, 1941, 73 y.o.   MRN: [id number]       CARDIOLOGY CONSULT NOTE  Patient ID: Caleb Wolf MRN: [id number] DOB/AGE: Apr 18, 1941 73 y.o.  Admit date: (Not on file) Primary Physician Asencion Noble, MD  Reason for Consultation: chest pain, carotid artery stenosis  HPI: The patient is a 73 year old male with a history of carotid artery stenosis. He was recently evaluated by vascular surgery and has a 75-80% left ICA stenosis and a 60-70% right ICA stenosis. He is planning to undergo left carotid endarterectomy. Approximately 3 weeks ago he had an episode of severe left anterior chest discomfort radiating into the left shoulder and arm. He has not described any neurologic symptoms. He has no known history of coronary artery disease. He has a prior history of tobacco use. He has a history of hypertension and hyperlipidemia. He is here with his wife Catalina Lunger, and they had been married for 26 years. She provides much of the details regarding his history. On August 22, he had been push mowing his lawn and began to experience significant chest tightness and his left arm felt very heavy. His wife noted that he was pale in color. His blood pressure was 160/70. The pain lasted for approximately one hour and after he sat down and rested, his blood pressure normalized and his symptoms were alleviated. He has been experiencing intermittent chest heaviness but this usually occurs with heavy lifting and may last for 10-15 minutes at a time. He was walking uphill after a football game in [place] within the past few weeks and did notice exertional dyspnea. He quit smoking over 30 years ago. He is also noted to have "sinking spells" where his blood pressure lowers to the 105-110/55 mmHg range, and he feels weak. He has sleep apnea and uses CPAP. He also has venous costs these and chronic left leg swelling for which he uses compression stockings.  ECG performed in the office today  demonstrates sinus bradycardia, heart rate 53 beats per minute, with no ischemic ST segment or T-wave abnormalities.  Soc: Quit smoking 30 yrs ago. Married x 44 yrs to [employer]. Lives in [place].  Fam: Father died of MI at 70. Mother died of MI at 3.    Allergies  Allergen Reactions  . Codeine Sulfate Nausea Only    Current Outpatient Prescriptions  Medication Sig Dispense Refill  . aspirin 81 MG tablet Take 81 mg by mouth daily.      . cloNIDine (CATAPRES) 0.1 MG tablet Take 0.1 mg by mouth 2 (two) times daily.      . finasteride (PROSCAR) 5 MG tablet Take 5 mg by mouth at bedtime.       . hydrochlorothiazide (HYDRODIURIL) 25 MG tablet Take 25 mg by mouth daily with breakfast.      . indomethacin (INDOCIN) 50 MG capsule Take 50 mg by mouth 2 (two) times daily as needed (for gout attack). Will take for 2-3 days during attack      . lisinopril (PRINIVIL,ZESTRIL) 40 MG tablet Take 40 mg by mouth at bedtime.       . metoprolol tartrate (LOPRESSOR) 25 MG tablet Take 25 mg by mouth 2 (two) times daily.      . naproxen sodium (ALEVE) 220 MG tablet Take 220 mg by mouth 2 (two) times daily with a meal.      . simvastatin (ZOCOR) 10 MG tablet Take 10 mg by mouth at bedtime.      Marland Kitchen  TESTIM 50 KG/8JE GEL Apply 2 clicks as needed up three times weekly      . verapamil (CALAN-SR) 180 MG CR tablet Take 180 mg by mouth 2 (two) times daily.       No current facility-administered medications for this visit.    Past Medical History  Diagnosis Date  . PONV (postoperative nausea and vomiting)   . Coronary artery disease   . GERD (gastroesophageal reflux disease)   . Arthritis   . Hyperlipidemia   . Sleep apnea     STOP BANG SCORE 8  . Carotid stenosis     07/04/11- 50-70%bilaterally- stable per Dr Willey Blade note  . Chronic kidney disease     stones, BPH  . Lower leg edema   . Lumbar disc disease   . Hypertension     clearance with OV notes Dr Willey Blade on chart, EKG from 5/12 on chart f     Past Surgical History  Procedure Laterality Date  . Fracture surgery      bilateral wrist fractures- one ORIF  . Rhinoplasty    . Joint replacement  2011    left knee  . Eye surgery      cataract extraction with repair macular tear , with IOL     right  . Total hip arthroplasty  12/08/2011    Procedure: TOTAL HIP ARTHROPLASTY;  Surgeon: Gearlean Alf, MD;  Location: [facility];  Service: Orthopedics;  Laterality: Right;    History   Social History  . Marital Status: Married    Spouse Name: N/A    Number of Children: N/A  . Years of Education: N/A   Occupational History  . retired     [employer]   Social History Main Topics  . Smoking status: Former Smoker -- 2.00 packs/day for 25 years    Types: Cigarettes    Start date: 08/08/1960    Quit date: 12/01/1983  . Smokeless tobacco: Never Used  . Alcohol Use: Yes     Comment: socially--wine monthly  . Drug Use: No  . Sexual Activity: Not on file   Other Topics Concern  . Not on file   Social History Narrative  . No narrative on file      Prior to Admission medications   Medication Sig Start Date End Date Taking? Authorizing Provider  aspirin 81 MG tablet Take 81 mg by mouth daily.   Yes Historical Provider, MD  cloNIDine (CATAPRES) 0.1 MG tablet Take 0.1 mg by mouth 2 (two) times daily.   Yes Historical Provider, MD  finasteride (PROSCAR) 5 MG tablet Take 5 mg by mouth at bedtime.    Yes Historical Provider, MD  hydrochlorothiazide (HYDRODIURIL) 25 MG tablet Take 25 mg by mouth daily with breakfast.   Yes Historical Provider, MD  indomethacin (INDOCIN) 50 MG capsule Take 50 mg by mouth 2 (two) times daily as needed (for gout attack). Will take for 2-3 days during attack   Yes Historical Provider, MD  lisinopril (PRINIVIL,ZESTRIL) 40 MG tablet Take 40 mg by mouth at bedtime.    Yes Historical Provider, MD  metoprolol tartrate (LOPRESSOR) 25 MG tablet Take 25 mg by mouth 2 (two) times daily.   Yes  Historical Provider, MD  naproxen sodium (ALEVE) 220 MG tablet Take 220 mg by mouth 2 (two) times daily with a meal.   Yes Historical Provider, MD  simvastatin (ZOCOR) 10 MG tablet Take 10 mg by mouth at bedtime.   Yes Historical Provider, MD  TESTIM 50 JT/7SV GEL Apply 2 clicks as needed up three times weekly 07/26/12  Yes Historical Provider, MD  verapamil (CALAN-SR) 180 MG CR tablet Take 180 mg by mouth 2 (two) times daily.   Yes Historical Provider, MD     Review of systems complete and found to be negative unless listed above in HPI     Physical exam Blood pressure 118/68, pulse 52, height 6' (1.829 m), weight 279 lb (126.554 kg), SpO2 96.00%. General: NAD, obese Neck: No JVD, no thyromegaly or thyroid nodule.  Lungs: Clear to auscultation bilaterally with normal respiratory effort. CV: Nondisplaced PMI. Bradycardic, regular rhythm, normal S1/S2, no S3/S4, no murmur.  Left leg with trace edema and venous varicosities b/l.  No carotid bruit.  Normal pedal pulses.  Abdomen: Soft, nontender, obese, no distention.  Skin: Intact without lesions or rashes.  Neurologic: Alert and oriented x 3.  Psych: Normal affect. Extremities: No clubbing or cyanosis.  HEENT: Normal.   ECG: Most recent ECG reviewed.  Labs:   Lab Results  Component Value Date   WBC 13.2* 12/10/2011   HGB 10.9* 12/10/2011   HCT 33.6* 12/10/2011   MCV 93.3 12/10/2011   PLT 188 12/10/2011   No results found for this basename: NA, K, CL, CO2, BUN, CREATININE, CALCIUM, LABALBU, PROT, BILITOT, ALKPHOS, ALT, AST, GLUCOSE,  in the last 168 hours No results found for this basename: CKTOTAL, CKMB, CKMBINDEX, TROPONINI    No results found for this basename: CHOL   No results found for this basename: HDL   No results found for this basename: LDLCALC   No results found for this basename: TRIG   No results found for this basename: CHOLHDL   No results found for this basename: LDLDIRECT         Studies: No results  found.  ASSESSMENT AND PLAN:  1. Chest pain: His symptoms are concerning for angina. His risk factors include hypertension, hyperlipidemia, peripheral vascular disease and a family history of myocardial infarction. I will obtain an echocardiogram to assess left Atripla systolic function and regional wall motion, as well as a Lexiscan Cardiolite stress test to evaluate for ischemic heart disease. Continue aspirin 81 mg and simvastatin 10 mg. I will reduce metoprolol to 12.5 mg twice daily to potentially attenuate symptomatic hypotension and bradycardia. 2. Essential HTN: Controlled on present therapy which includes clonidine, hydrochlorothiazide, lisinopril, metoprolol and verapamil. 3. Hyperlipidemia: Continue simvastatin 10 mg daily. He may require at least moderate intensity statin therapy. 4. Preoperative risk stratification: He is at least at a moderate risk for a major adverse cardiovascular event given his multiple comorbidities. I will obtain cardiovascular testing as noted above. 5. Carotid artery stenosis: Left carotid endarterectomy will be deferred until risk stratification can be more accurately assessed. Continue ASA and statin. 6. Sleep apnea: Uses CPAP. 7. Leg swelling/venous varicosities: Continue use of compression stockings. On HCTZ.  Dispo: f/u next week with me.  Signed: Kate Sable, [name], F.A.C.C.  02/09/2014, 11:36 AM

## 2014-02-10 ENCOUNTER — Other Ambulatory Visit (HOSPITAL_COMMUNITY): Payer: Medicare Other

## 2014-02-14 ENCOUNTER — Encounter (HOSPITAL_COMMUNITY)
Admission: RE | Admit: 2014-02-14 | Discharge: 2014-02-14 | Disposition: A | Discharge status: Home / Self Care | Payer: Medicare Other | Source: Ambulatory Visit | Attending: Cardiovascular Disease | Admitting: Cardiovascular Disease

## 2014-02-14 ENCOUNTER — Ambulatory Visit (HOSPITAL_COMMUNITY)
Admission: RE | Admit: 2014-02-14 | Discharge: 2014-02-14 | Disposition: A | Discharge status: Home / Self Care | Payer: Medicare Other | Source: Ambulatory Visit | Attending: Cardiovascular Disease | Admitting: Cardiovascular Disease

## 2014-02-14 ENCOUNTER — Encounter (HOSPITAL_COMMUNITY): Payer: Self-pay

## 2014-02-14 ENCOUNTER — Telehealth: Payer: Self-pay | Admitting: *Deleted

## 2014-02-14 ENCOUNTER — Encounter: Payer: Self-pay | Admitting: *Deleted

## 2014-02-14 DIAGNOSIS — Z01810 Encounter for preprocedural cardiovascular examination: Secondary | ICD-10-CM | POA: Diagnosis not present

## 2014-02-14 DIAGNOSIS — R079 Chest pain, unspecified: Secondary | ICD-10-CM | POA: Insufficient documentation

## 2014-02-14 DIAGNOSIS — R9431 Abnormal electrocardiogram [ECG] [EKG]: Secondary | ICD-10-CM | POA: Insufficient documentation

## 2014-02-14 DIAGNOSIS — Z86718 Personal history of other venous thrombosis and embolism: Secondary | ICD-10-CM | POA: Diagnosis not present

## 2014-02-14 DIAGNOSIS — Z01818 Encounter for other preprocedural examination: Secondary | ICD-10-CM

## 2014-02-14 DIAGNOSIS — I079 Rheumatic tricuspid valve disease, unspecified: Secondary | ICD-10-CM | POA: Insufficient documentation

## 2014-02-14 DIAGNOSIS — I369 Nonrheumatic tricuspid valve disorder, unspecified: Secondary | ICD-10-CM | POA: Diagnosis not present

## 2014-02-14 DIAGNOSIS — E785 Hyperlipidemia, unspecified: Secondary | ICD-10-CM | POA: Insufficient documentation

## 2014-02-14 DIAGNOSIS — Z87891 Personal history of nicotine dependence: Secondary | ICD-10-CM | POA: Insufficient documentation

## 2014-02-14 DIAGNOSIS — I10 Essential (primary) hypertension: Secondary | ICD-10-CM

## 2014-02-14 MED ORDER — REGADENOSON 0.4 MG/5ML IV SOLN
0.4000 mg | Freq: Once | INTRAVENOUS | Status: AC | PRN
  Administered 2014-02-14: 0.4 mg via INTRAVENOUS

## 2014-02-14 MED ORDER — TECHNETIUM TC 99M SESTAMIBI GENERIC - CARDIOLITE
10.0000 | Freq: Once | INTRAVENOUS | Status: AC | PRN
  Administered 2014-02-14: 10 via INTRAVENOUS

## 2014-02-14 MED ORDER — REGADENOSON 0.4 MG/5ML IV SOLN
INTRAVENOUS | Status: AC
  Administered 2014-02-14: 0.4 mg via INTRAVENOUS
  Filled 2014-02-14: qty 5

## 2014-02-14 MED ORDER — SODIUM CHLORIDE 0.9 % IJ SOLN
10.0000 mL | INTRAMUSCULAR | Status: DC | PRN
Start: 2014-02-14 — End: 2014-02-15
  Administered 2014-02-14: 10 mL via INTRAVENOUS

## 2014-02-14 MED ORDER — SODIUM CHLORIDE 0.9 % IJ SOLN
INTRAMUSCULAR | Status: AC
  Administered 2014-02-14: 10 mL via INTRAVENOUS
  Filled 2014-02-14: qty 10

## 2014-02-14 MED ORDER — TECHNETIUM TC 99M SESTAMIBI - CARDIOLITE
30.0000 | Freq: Once | INTRAVENOUS | Status: AC | PRN
  Administered 2014-02-14: 10:00:00 30 via INTRAVENOUS

## 2014-02-14 NOTE — Telephone Encounter (Signed)
Message copied by Desma Mcgregor on Tue Feb 14, 2014 10:46 AM ------      Message from: Kate Sable A      Created: Tue Feb 14, 2014 10:28 AM       Also have him cut back Verapamil to 180 mg daily, not twice daily. ------

## 2014-02-14 NOTE — Telephone Encounter (Signed)
Pt and wife notified and understood to d/c metoprolol and take verapamil daily. Updated medication list as well will FYI Dr. Bronson Ing

## 2014-02-14 NOTE — Progress Notes (Signed)
  Echocardiogram 2D Echocardiogram has been performed.  [place], [place] 02/14/2014, 11:41 AM

## 2014-02-14 NOTE — Telephone Encounter (Signed)
Message copied by Desma Mcgregor on Tue Feb 14, 2014 10:49 AM ------      Message from: Kate Sable A      Created: Tue Feb 14, 2014 10:25 AM       Please have pt d/c metoprolol altogether. During stress test, he is bradycardic even on lower dose.            Jamesetta So ------

## 2014-02-14 NOTE — Telephone Encounter (Signed)
Pt and wife understood to d/c metoprolol. Updated medication list and FYI Dr. Bronson Ing

## 2014-02-14 NOTE — Progress Notes (Signed)
Stress Lab Nurses Notes - Kemper Heupel 02/14/2014 Reason for doing test: Chest Pain and Carotid Artery Stenosis Type of test: Wille Glaser Nurse performing test: Gerrit Halls, RN Nuclear Medicine Tech: Melburn Hake Echo Tech: Not Applicable MD performing test: Koneswaran/K.Purcell Nails NP Family MD: Willey Blade Test explained and consent signed: Yes.   IV started: Saline lock flushed, No redness or edema and Saline lock started in radiology Symptoms: Headache Treatment/Intervention: None Reason test stopped: protocol completed After recovery IV was: Discontinued via X-ray tech and No redness or edema Patient to return to Nuc. Med at : 9:40 Patient discharged: Home Patient's Condition upon discharge was: stable Comments: During test BP 108/46 & HR 53.  Recovery BP 106/46 & HR 42.  Symptoms resolved in recovery. Geanie Cooley T

## 2014-02-15 ENCOUNTER — Encounter: Payer: Self-pay | Admitting: *Deleted

## 2014-02-15 ENCOUNTER — Encounter: Payer: Self-pay | Admitting: Cardiovascular Disease

## 2014-02-15 ENCOUNTER — Ambulatory Visit (INDEPENDENT_AMBULATORY_CARE_PROVIDER_SITE_OTHER): Payer: Medicare Other | Admitting: Cardiovascular Disease

## 2014-02-15 VITALS — BP 142/72 | HR 64 | Ht 70.0 in | Wt 274.0 lb

## 2014-02-15 DIAGNOSIS — R079 Chest pain, unspecified: Secondary | ICD-10-CM | POA: Diagnosis not present

## 2014-02-15 DIAGNOSIS — E785 Hyperlipidemia, unspecified: Secondary | ICD-10-CM

## 2014-02-15 DIAGNOSIS — R0609 Other forms of dyspnea: Secondary | ICD-10-CM

## 2014-02-15 DIAGNOSIS — G4730 Sleep apnea, unspecified: Secondary | ICD-10-CM

## 2014-02-15 DIAGNOSIS — I658 Occlusion and stenosis of other precerebral arteries: Secondary | ICD-10-CM

## 2014-02-15 DIAGNOSIS — I6529 Occlusion and stenosis of unspecified carotid artery: Secondary | ICD-10-CM | POA: Diagnosis not present

## 2014-02-15 DIAGNOSIS — M7989 Other specified soft tissue disorders: Secondary | ICD-10-CM

## 2014-02-15 DIAGNOSIS — I10 Essential (primary) hypertension: Secondary | ICD-10-CM

## 2014-02-15 DIAGNOSIS — I519 Heart disease, unspecified: Secondary | ICD-10-CM

## 2014-02-15 DIAGNOSIS — Z01818 Encounter for other preprocedural examination: Secondary | ICD-10-CM

## 2014-02-15 DIAGNOSIS — Z136 Encounter for screening for cardiovascular disorders: Secondary | ICD-10-CM

## 2014-02-15 DIAGNOSIS — Z713 Dietary counseling and surveillance: Secondary | ICD-10-CM

## 2014-02-15 DIAGNOSIS — I8390 Asymptomatic varicose veins of unspecified lower extremity: Secondary | ICD-10-CM

## 2014-02-15 DIAGNOSIS — R0989 Other specified symptoms and signs involving the circulatory and respiratory systems: Secondary | ICD-10-CM

## 2014-02-15 DIAGNOSIS — Z7182 Exercise counseling: Secondary | ICD-10-CM

## 2014-02-15 DIAGNOSIS — I5189 Other ill-defined heart diseases: Secondary | ICD-10-CM

## 2014-02-15 DIAGNOSIS — I6523 Occlusion and stenosis of bilateral carotid arteries: Secondary | ICD-10-CM

## 2014-02-15 MED ORDER — NITROGLYCERIN 0.4 MG SL SUBL: 0.4000 mg | SUBLINGUAL_TABLET | SUBLINGUAL | Status: DC | PRN

## 2014-02-15 NOTE — Patient Instructions (Signed)
Your physician recommends that you schedule a follow-up appointment in: 3 months with Dr. Bronson Ing  Your physician recommends that you continue on your current medications as directed. Please refer to the Current Medication list given to you today.  I have ordered Nitroglycerin  Thank you for choosing [facility]!!

## 2014-02-15 NOTE — Addendum Note (Signed)
Addended by: Hilarie Fredrickson T on: 02/15/2014 03:45 PM   Modules accepted: Orders

## 2014-02-15 NOTE — Progress Notes (Signed)
Patient ID: Caleb Wolf, male   DOB: 1941/02/18, 73 y.o.   MRN: [id number]      SUBJECTIVE:the patient returns for followup after undergoing cardiovascular testing performed for the evaluation of chest pain. Echocardiography demonstrated normal left ventricular systolic function, EF 12-45%, normal regional wall motion, grade 2 diastolic dysfunction, moderate left atrial enlargement, mild right atrial and right ventricular enlargement, and mildly elevated pulmonary pressures with mild tricuspid regurgitation. Nuclear stress testing did not show any evidence of myocardial ischemia or scar. Resting heart rate during stress test was 37-53 beats per minute for which I adjusted his medications by stopping metoprolol altogether and decreasing verapamil to 180 mg daily. He is now eating better and both he and his wife have several questions regarding primary prevention with respect to diet and exercise. He has had some very mild chest tightness but nothing significant. Surgery is being scheduled for later this week.  Review of Systems: As per "subjective", otherwise negative.  Allergies  Allergen Reactions  . Codeine Sulfate Nausea Only    Current Outpatient Prescriptions  Medication Sig Dispense Refill  . aspirin 81 MG tablet Take 81 mg by mouth daily.      . cloNIDine (CATAPRES) 0.1 MG tablet Take 0.1 mg by mouth 2 (two) times daily.      . finasteride (PROSCAR) 5 MG tablet Take 5 mg by mouth at bedtime.       . hydrochlorothiazide (HYDRODIURIL) 25 MG tablet Take 25 mg by mouth daily with breakfast.      . indomethacin (INDOCIN) 50 MG capsule Take 50 mg by mouth 2 (two) times daily as needed (for gout attack). Will take for 2-3 days during attack      . lisinopril (PRINIVIL,ZESTRIL) 40 MG tablet Take 40 mg by mouth at bedtime.       . naproxen sodium (ALEVE) 220 MG tablet Take 220 mg by mouth as needed.       . simvastatin (ZOCOR) 10 MG tablet Take 10 mg by mouth at bedtime.      . TESTIM 50  YK/9XI GEL Apply 2 clicks as needed up three times weekly      . verapamil (CALAN-SR) 180 MG CR tablet Take 180 mg by mouth daily.        No current facility-administered medications for this visit.    Past Medical History  Diagnosis Date  . PONV (postoperative nausea and vomiting)   . Coronary artery disease   . GERD (gastroesophageal reflux disease)   . Arthritis   . Hyperlipidemia   . Sleep apnea     STOP BANG SCORE 8  . Carotid stenosis     07/04/11- 50-70%bilaterally- stable per Dr Willey Blade note  . Chronic kidney disease     stones, BPH  . Lower leg edema   . Lumbar disc disease   . Hypertension     clearance with OV notes Dr Willey Blade on chart, EKG from 5/12 on chart f    Past Surgical History  Procedure Laterality Date  . Fracture surgery      bilateral wrist fractures- one ORIF  . Rhinoplasty    . Joint replacement  2011    left knee  . Eye surgery      cataract extraction with repair macular tear , with IOL     right  . Total hip arthroplasty  12/08/2011    Procedure: TOTAL HIP ARTHROPLASTY;  Surgeon: Gearlean Alf, MD;  Location: [facility];  Service: Orthopedics;  Laterality:  Right;    History   Social History  . Marital Status: Married    Spouse Name: N/A    Number of Children: N/A  . Years of Education: N/A   Occupational History  . retired     [employer]   Social History Main Topics  . Smoking status: Former Smoker -- 2.00 packs/day for 25 years    Types: Cigarettes    Start date: 08/08/1960    Quit date: 12/01/1983  . Smokeless tobacco: Never Used  . Alcohol Use: Yes     Comment: socially--wine monthly  . Drug Use: No  . Sexual Activity: Not on file   Other Topics Concern  . Not on file   Social History Narrative  . No narrative on file     Filed Vitals:   02/15/14 1501  BP: 142/72  Pulse: 80  Height: 5\' 10"  (1.778 m)  Weight: 274 lb (124.286 kg)  SpO2: 96%    PHYSICAL EXAM General: NAD, obese. HEENT: Normal. Neck: No  JVD, no thyromegaly. Lungs: Clear to auscultation bilaterally with normal respiratory effort. CV: Nondisplaced PMI.  Regular rate and rhythm, normal S1/S2, no S3/S4, no murmur. No pretibial or periankle edema.  No carotid bruit.  Normal pedal pulses.  Abdomen: Soft, nontender, obese, no distention.  Neurologic: Alert and oriented x 3.  Psych: Normal affect. Skin: Normal. Musculoskeletal: Normal range of motion, no gross deformities. Extremities: No clubbing or cyanosis.   ECG: Most recent ECG reviewed.      ASSESSMENT AND PLAN: 1. Chest pain: No further recurrences. Symptoms possibly consistent with stable angina. I will prescribe SL nitroglycerin and instructed him in detail regarding its proper use. His cardiovascular risk factors include hypertension, hyperlipidemia, peripheral vascular disease and a family history of myocardial infarction. Continue aspirin 81 mg and simvastatin 10 mg.   2. Essential HTN: Very mildly elevated today on present therapy which includes clonidine, hydrochlorothiazide, lisinopril and verapamil. Will continue to monitor. 3. Hyperlipidemia: Continue simvastatin 10 mg daily. He may require at least moderate intensity statin therapy.  4. Preoperative risk stratification: He is at a moderate risk for a major adverse cardiovascular event given his multiple comorbidities. 5. Carotid artery stenosis: Left carotid endarterectomy will be scheduled by vascular surgery. Continue ASA and statin.  6. Sleep apnea: Uses CPAP.  7. Leg swelling/venous varicosities: Continue use of compression stockings. On HCTZ.  8. Bradycardia: Resolved with cessation of metoprolol and reduction of verapamil to 180 mg daily. 9. Dietary and exercise counseling given.  Dispo: f/u 3 months.  Time spent: 40 minutes, of which >50% spent on providing a detailed plan for the primary prevention of heart disease including dietary and exercise modification.  Kate Sable, [name], F.A.C.C.

## 2014-02-16 ENCOUNTER — Ambulatory Visit (HOSPITAL_COMMUNITY)
Admission: RE | Admit: 2014-02-16 | Discharge: 2014-02-16 | Disposition: A | Discharge status: Home / Self Care | Payer: Medicare Other | Source: Ambulatory Visit | Attending: Anesthesiology | Admitting: Anesthesiology

## 2014-02-16 ENCOUNTER — Encounter (HOSPITAL_COMMUNITY)
Admission: RE | Admit: 2014-02-16 | Discharge: 2014-02-16 | Disposition: A | Discharge status: Home / Self Care | Payer: Medicare Other | Source: Ambulatory Visit | Attending: Vascular Surgery | Admitting: Vascular Surgery

## 2014-02-16 ENCOUNTER — Encounter (HOSPITAL_COMMUNITY): Payer: Self-pay

## 2014-02-16 DIAGNOSIS — Z96659 Presence of unspecified artificial knee joint: Secondary | ICD-10-CM | POA: Diagnosis not present

## 2014-02-16 DIAGNOSIS — I2510 Atherosclerotic heart disease of native coronary artery without angina pectoris: Secondary | ICD-10-CM | POA: Diagnosis present

## 2014-02-16 DIAGNOSIS — G4730 Sleep apnea, unspecified: Secondary | ICD-10-CM

## 2014-02-16 DIAGNOSIS — K219 Gastro-esophageal reflux disease without esophagitis: Secondary | ICD-10-CM | POA: Diagnosis present

## 2014-02-16 DIAGNOSIS — R339 Retention of urine, unspecified: Secondary | ICD-10-CM | POA: Diagnosis present

## 2014-02-16 DIAGNOSIS — E785 Hyperlipidemia, unspecified: Secondary | ICD-10-CM | POA: Diagnosis present

## 2014-02-16 DIAGNOSIS — I6529 Occlusion and stenosis of unspecified carotid artery: Secondary | ICD-10-CM | POA: Diagnosis not present

## 2014-02-16 DIAGNOSIS — Z7982 Long term (current) use of aspirin: Secondary | ICD-10-CM | POA: Diagnosis not present

## 2014-02-16 DIAGNOSIS — M109 Gout, unspecified: Secondary | ICD-10-CM | POA: Diagnosis present

## 2014-02-16 DIAGNOSIS — M129 Arthropathy, unspecified: Secondary | ICD-10-CM | POA: Diagnosis present

## 2014-02-16 DIAGNOSIS — Z87891 Personal history of nicotine dependence: Secondary | ICD-10-CM | POA: Diagnosis not present

## 2014-02-16 DIAGNOSIS — M549 Dorsalgia, unspecified: Secondary | ICD-10-CM | POA: Diagnosis not present

## 2014-02-16 DIAGNOSIS — I10 Essential (primary) hypertension: Secondary | ICD-10-CM | POA: Diagnosis present

## 2014-02-16 DIAGNOSIS — Z96649 Presence of unspecified artificial hip joint: Secondary | ICD-10-CM | POA: Diagnosis not present

## 2014-02-16 DIAGNOSIS — Z79899 Other long term (current) drug therapy: Secondary | ICD-10-CM | POA: Diagnosis not present

## 2014-02-16 DIAGNOSIS — R112 Nausea with vomiting, unspecified: Secondary | ICD-10-CM | POA: Diagnosis present

## 2014-02-16 DIAGNOSIS — I739 Peripheral vascular disease, unspecified: Secondary | ICD-10-CM | POA: Diagnosis present

## 2014-02-16 DIAGNOSIS — Z6841 Body Mass Index (BMI) 40.0 and over, adult: Secondary | ICD-10-CM | POA: Diagnosis not present

## 2014-02-16 HISTORY — DX: Gout, unspecified: M10.9

## 2014-02-16 HISTORY — DX: Personal history of colonic polyps: Z86.010

## 2014-02-16 HISTORY — DX: Benign prostatic hyperplasia without lower urinary tract symptoms: N40.0

## 2014-02-16 HISTORY — DX: Personal history of urinary calculi: Z87.442

## 2014-02-16 HISTORY — DX: Personal history of colon polyps, unspecified: Z86.0100

## 2014-02-16 LAB — CBC
HCT: 43.1 % (ref 39.0–52.0)
HEMOGLOBIN: 14.3 g/dL (ref 13.0–17.0)
MCH: 30.4 pg (ref 26.0–34.0)
MCHC: 33.2 g/dL (ref 30.0–36.0)
MCV: 91.7 fL (ref 78.0–100.0)
Platelets: 219 10*3/uL (ref 150–400)
RBC: 4.7 MIL/uL (ref 4.22–5.81)
RDW: 13.1 % (ref 11.5–15.5)
WBC: 9.5 10*3/uL (ref 4.0–10.5)

## 2014-02-16 LAB — COMPREHENSIVE METABOLIC PANEL
ALT: 15 U/L (ref 0–53)
AST: 16 U/L (ref 0–37)
Albumin: 4.2 g/dL (ref 3.5–5.2)
Alkaline Phosphatase: 88 U/L (ref 39–117)
Anion gap: 15 (ref 5–15)
BILIRUBIN TOTAL: 0.3 mg/dL (ref 0.3–1.2)
BUN: 28 mg/dL — ABNORMAL HIGH (ref 6–23)
CALCIUM: 9.5 mg/dL (ref 8.4–10.5)
CO2: 24 meq/L (ref 19–32)
CREATININE: 1.38 mg/dL — AB (ref 0.50–1.35)
Chloride: 100 mEq/L (ref 96–112)
GFR, EST AFRICAN AMERICAN: 58 mL/min — AB (ref >90)
GFR, EST NON AFRICAN AMERICAN: 50 mL/min — AB (ref >90)
GLUCOSE: 85 mg/dL (ref 70–99)
Potassium: 5 mEq/L (ref 3.7–5.3)
Sodium: 139 mEq/L (ref 137–147)
Total Protein: 7.3 g/dL (ref 6.0–8.3)

## 2014-02-16 LAB — URINALYSIS, ROUTINE W REFLEX MICROSCOPIC
BILIRUBIN URINE: NEGATIVE
Glucose, UA: NEGATIVE mg/dL
Hgb urine dipstick: NEGATIVE
Ketones, ur: NEGATIVE mg/dL
Leukocytes, UA: NEGATIVE
NITRITE: NEGATIVE
PH: 5.5 (ref 5.0–8.0)
Protein, ur: NEGATIVE mg/dL
Specific Gravity, Urine: 1.012 (ref 1.005–1.030)
Urobilinogen, UA: 0.2 mg/dL (ref 0.0–1.0)

## 2014-02-16 LAB — APTT: aPTT: 28 seconds (ref 24–37)

## 2014-02-16 LAB — PROTIME-INR
INR: 1.02 (ref 0.00–1.49)
Prothrombin Time: 13.4 seconds (ref 11.6–15.2)

## 2014-02-16 LAB — SURGICAL PCR SCREEN
MRSA, PCR: NEGATIVE
STAPHYLOCOCCUS AUREUS: POSITIVE — AB

## 2014-02-16 LAB — ABO/RH: ABO/RH(D): A POS

## 2014-02-16 LAB — PREPARE RBC (CROSSMATCH)

## 2014-02-16 MED ORDER — CHLORHEXIDINE GLUCONATE 4 % EX LIQD: 60.0000 mL | Freq: Once | CUTANEOUS | Status: DC

## 2014-02-16 MED ORDER — DEXTROSE 5 % IV SOLN
1.5000 g | INTRAVENOUS | Status: AC
  Administered 2014-02-17: 1.5 g via INTRAVENOUS
  Filled 2014-02-16: qty 1.5

## 2014-02-16 NOTE — Progress Notes (Deleted)
Per pt and wife no one from pharmacy ever called to get a medication list for upcoming surgery.Instructed pt not to leave until speaking with Pharmacy tech.Pharmacy tech paged and states he wasn't on her list to be seen so he had been done.(Not complete in system) Pt still was waiting to speak with tech 45 mins later so our Secretary in PAT paged pharmacy tech again who stated that meds were in the system and that someone spoke with pt on Sept 9,pt and wife insisting no one did. So pharmacy tech had our secretary read off meds to her via phone.

## 2014-02-16 NOTE — Pre-Procedure Instructions (Signed)
ROMULUS HANRAHAN  02/16/2014   Your procedure is scheduled on:  Fri, Sept 18 @ 10:55 AM  Report to [facility]  at 9:45 AM.  Call this number if you have problems the morning of surgery: [phone]   Remember:   Do not eat food or drink liquids after midnight.   Take these medicines the morning of surgery with A SIP OF WATER: Clonidine(Catapres) and Aspirin               No Goody's,BC's,Aleve,Ibuprofen,Fish Oil,or any Herbal Medications   Do not wear jewelry  Do not wear lotions, powders, or colognes. You may wear deodorant.             Men may shave face and neck.  Do not bring valuables to the hospital.  [facility] is not responsible                  for any belongings or valuables.               Contacts, dentures or bridgework may not be worn into surgery.  Leave suitcase in the car. After surgery it may be brought to your room.  For patients admitted to the hospital, discharge time is determined by your                treatment team.              [facility] - Preparing for Surgery  Before surgery, you can play an important role.  Because skin is not sterile, your skin needs to be as free of germs as possible.  You can reduce the number of germs on you skin by washing with CHG (chlorahexidine gluconate) soap before surgery.  CHG is an antiseptic cleaner which kills germs and bonds with the skin to continue killing germs even after washing.  Please DO NOT use if you have an allergy to CHG or antibacterial soaps.  If your skin becomes reddened/irritated stop using the CHG and inform your nurse when you arrive at Short Stay.  Do not shave (including legs and underarms) for at least 48 hours prior to the first CHG shower.  You may shave your face.  Please follow these instructions carefully:   1.  Shower with CHG Soap the night before surgery and the                                morning of Surgery.  2.  If you choose to wash your hair, wash your hair first as usual with  your       normal shampoo.  3.  After you shampoo, rinse your hair and body thoroughly to remove the                      Shampoo.  4.  Use CHG as you would any other liquid soap.  You can apply chg directly       to the skin and wash gently with scrungie or a clean washcloth.  5.  Apply the CHG Soap to your body ONLY FROM THE NECK DOWN.        Do not use on open wounds or open sores.  Avoid contact with your eyes,       ears, mouth and genitals (private parts).  Wash genitals (private parts)       with your normal soap.  6.  Wash thoroughly, paying special attention to the area where your surgery        will be performed.  7.  Thoroughly rinse your body with warm water from the neck down.  8.  DO NOT shower/wash with your normal soap after using and rinsing off       the CHG Soap.  9.  Pat yourself dry with a clean towel.            10.  Wear clean pajamas.            11.  Place clean sheets on your bed the night of your first shower and do not        sleep with pets.  Day of Surgery  Do not apply any lotions/deoderants the morning of surgery.  Please wear clean clothes to the hospital/surgery center.    Please read over the following fact sheets that you were given: Pain Booklet, Coughing and Deep Breathing, Blood Transfusion Information, MRSA Information and Surgical Site Infection Prevention

## 2014-02-16 NOTE — Progress Notes (Addendum)
Cardiologist is Dr.Konseswaran with clearance in epic  Sleep study in epic from 2014  Echo and stress test in epic from 2015  EKG in epic from 02-09-14  Medical Md is Dr.Roy Willey Blade  Denies ever having a heart cath  Denies CXR in past yr

## 2014-02-17 ENCOUNTER — Encounter (HOSPITAL_COMMUNITY): Payer: Medicare Other | Admitting: Anesthesiology

## 2014-02-17 ENCOUNTER — Telehealth: Payer: Self-pay | Admitting: Vascular Surgery

## 2014-02-17 ENCOUNTER — Encounter (HOSPITAL_COMMUNITY): Payer: Self-pay | Admitting: *Deleted

## 2014-02-17 ENCOUNTER — Inpatient Hospital Stay (HOSPITAL_COMMUNITY): Payer: Medicare Other | Admitting: Anesthesiology

## 2014-02-17 ENCOUNTER — Inpatient Hospital Stay (HOSPITAL_COMMUNITY)
Admission: RE | Admit: 2014-02-17 | Discharge: 2014-02-19 | DRG: 038 | Disposition: A | Discharge status: Home / Self Care | Payer: Medicare Other | Source: Ambulatory Visit | Attending: Vascular Surgery | Admitting: Vascular Surgery

## 2014-02-17 ENCOUNTER — Encounter (HOSPITAL_COMMUNITY)
Admission: RE | Disposition: A | Discharge status: Home / Self Care | Payer: Self-pay | Source: Ambulatory Visit | Attending: Vascular Surgery

## 2014-02-17 DIAGNOSIS — M129 Arthropathy, unspecified: Secondary | ICD-10-CM | POA: Diagnosis present

## 2014-02-17 DIAGNOSIS — R339 Retention of urine, unspecified: Secondary | ICD-10-CM | POA: Diagnosis present

## 2014-02-17 DIAGNOSIS — I6529 Occlusion and stenosis of unspecified carotid artery: Principal | ICD-10-CM | POA: Diagnosis present

## 2014-02-17 DIAGNOSIS — I2510 Atherosclerotic heart disease of native coronary artery without angina pectoris: Secondary | ICD-10-CM | POA: Diagnosis not present

## 2014-02-17 DIAGNOSIS — R112 Nausea with vomiting, unspecified: Secondary | ICD-10-CM | POA: Diagnosis present

## 2014-02-17 DIAGNOSIS — Z96659 Presence of unspecified artificial knee joint: Secondary | ICD-10-CM | POA: Diagnosis not present

## 2014-02-17 DIAGNOSIS — K219 Gastro-esophageal reflux disease without esophagitis: Secondary | ICD-10-CM | POA: Diagnosis not present

## 2014-02-17 DIAGNOSIS — E785 Hyperlipidemia, unspecified: Secondary | ICD-10-CM | POA: Diagnosis present

## 2014-02-17 DIAGNOSIS — M109 Gout, unspecified: Secondary | ICD-10-CM | POA: Diagnosis present

## 2014-02-17 DIAGNOSIS — Z6841 Body Mass Index (BMI) 40.0 and over, adult: Secondary | ICD-10-CM

## 2014-02-17 DIAGNOSIS — Z79899 Other long term (current) drug therapy: Secondary | ICD-10-CM | POA: Diagnosis not present

## 2014-02-17 DIAGNOSIS — M549 Dorsalgia, unspecified: Secondary | ICD-10-CM | POA: Diagnosis not present

## 2014-02-17 DIAGNOSIS — I10 Essential (primary) hypertension: Secondary | ICD-10-CM | POA: Diagnosis not present

## 2014-02-17 DIAGNOSIS — Z7982 Long term (current) use of aspirin: Secondary | ICD-10-CM | POA: Diagnosis not present

## 2014-02-17 DIAGNOSIS — Z96649 Presence of unspecified artificial hip joint: Secondary | ICD-10-CM

## 2014-02-17 DIAGNOSIS — G4730 Sleep apnea, unspecified: Secondary | ICD-10-CM | POA: Diagnosis present

## 2014-02-17 DIAGNOSIS — I739 Peripheral vascular disease, unspecified: Secondary | ICD-10-CM | POA: Diagnosis present

## 2014-02-17 DIAGNOSIS — I6522 Occlusion and stenosis of left carotid artery: Secondary | ICD-10-CM

## 2014-02-17 DIAGNOSIS — Z87891 Personal history of nicotine dependence: Secondary | ICD-10-CM

## 2014-02-17 HISTORY — PX: ENDARTERECTOMY: SHX5162

## 2014-02-17 SURGERY — ENDARTERECTOMY, CAROTID
Anesthesia: General | Site: Neck | Laterality: Left

## 2014-02-17 MED ORDER — LABETALOL HCL 5 MG/ML IV SOLN
10.0000 mg | INTRAVENOUS | Status: DC | PRN
  Administered 2014-02-17 – 2014-02-19 (×4): 10 mg via INTRAVENOUS
  Filled 2014-02-17 (×4): qty 4

## 2014-02-17 MED ORDER — ONDANSETRON HCL 4 MG/2ML IJ SOLN
INTRAMUSCULAR | Status: AC
  Filled 2014-02-17: qty 2

## 2014-02-17 MED ORDER — OXYCODONE HCL 5 MG PO TABS: 5.0000 mg | ORAL_TABLET | Freq: Four times a day (QID) | ORAL | Status: DC | PRN

## 2014-02-17 MED ORDER — LIDOCAINE HCL (CARDIAC) 20 MG/ML IV SOLN
INTRAVENOUS | Status: DC | PRN
  Administered 2014-02-17: 100 mg via INTRAVENOUS

## 2014-02-17 MED ORDER — ROCURONIUM BROMIDE 50 MG/5ML IV SOLN
INTRAVENOUS | Status: AC
  Filled 2014-02-17: qty 1

## 2014-02-17 MED ORDER — DOCUSATE SODIUM 100 MG PO CAPS
100.0000 mg | ORAL_CAPSULE | Freq: Every day | ORAL | Status: DC
  Administered 2014-02-18 – 2014-02-19 (×2): 100 mg via ORAL
  Filled 2014-02-17 (×2): qty 1

## 2014-02-17 MED ORDER — LABETALOL HCL 5 MG/ML IV SOLN
10.0000 mg | Freq: Once | INTRAVENOUS | Status: AC
  Administered 2014-02-17: 10 mg via INTRAVENOUS

## 2014-02-17 MED ORDER — SUCCINYLCHOLINE CHLORIDE 20 MG/ML IJ SOLN
INTRAMUSCULAR | Status: AC
  Filled 2014-02-17: qty 1

## 2014-02-17 MED ORDER — PHENYLEPHRINE HCL 10 MG/ML IJ SOLN
10.0000 mg | INTRAVENOUS | Status: DC | PRN
  Administered 2014-02-17: 10 ug/min via INTRAVENOUS

## 2014-02-17 MED ORDER — LIDOCAINE HCL (CARDIAC) 20 MG/ML IV SOLN
INTRAVENOUS | Status: AC
  Filled 2014-02-17: qty 5

## 2014-02-17 MED ORDER — INFLUENZA VAC SPLIT QUAD 0.5 ML IM SUSY
0.5000 mL | PREFILLED_SYRINGE | INTRAMUSCULAR | Status: DC
  Filled 2014-02-17: qty 0.5

## 2014-02-17 MED ORDER — NEOSTIGMINE METHYLSULFATE 10 MG/10ML IV SOLN
INTRAVENOUS | Status: DC | PRN
  Administered 2014-02-17: 3 mg via INTRAVENOUS

## 2014-02-17 MED ORDER — GLYCOPYRROLATE 0.2 MG/ML IJ SOLN
INTRAMUSCULAR | Status: DC | PRN
  Administered 2014-02-17 (×2): 0.2 mg via INTRAVENOUS
  Administered 2014-02-17: 0.4 mg via INTRAVENOUS

## 2014-02-17 MED ORDER — HYDRALAZINE HCL 20 MG/ML IJ SOLN
10.0000 mg | INTRAMUSCULAR | Status: DC | PRN
  Administered 2014-02-17 – 2014-02-18 (×4): 10 mg via INTRAVENOUS
  Filled 2014-02-17 (×5): qty 1

## 2014-02-17 MED ORDER — LACTATED RINGERS IV SOLN
INTRAVENOUS | Status: DC | PRN
  Administered 2014-02-17 (×2): via INTRAVENOUS

## 2014-02-17 MED ORDER — NITROGLYCERIN 0.4 MG SL SUBL: 0.4000 mg | SUBLINGUAL_TABLET | SUBLINGUAL | Status: DC | PRN

## 2014-02-17 MED ORDER — PHENOL 1.4 % MT LIQD
1.0000 | OROMUCOSAL | Status: DC | PRN
  Administered 2014-02-18: 1 via OROMUCOSAL
  Filled 2014-02-17 (×2): qty 177

## 2014-02-17 MED ORDER — ACETAMINOPHEN 650 MG RE SUPP: 325.0000 mg | RECTAL | Status: DC | PRN

## 2014-02-17 MED ORDER — SODIUM CHLORIDE 0.9 % IV SOLN
INTRAVENOUS | Status: DC
  Administered 2014-02-17: 16:00:00 via INTRAVENOUS

## 2014-02-17 MED ORDER — MIDAZOLAM HCL 5 MG/5ML IJ SOLN
INTRAMUSCULAR | Status: DC | PRN
  Administered 2014-02-17: 1 mg via INTRAVENOUS

## 2014-02-17 MED ORDER — 0.9 % SODIUM CHLORIDE (POUR BTL) OPTIME
TOPICAL | Status: DC | PRN
  Administered 2014-02-17: 1000 mL

## 2014-02-17 MED ORDER — ASPIRIN 81 MG PO CHEW
81.0000 mg | CHEWABLE_TABLET | Freq: Every day | ORAL | Status: DC
  Administered 2014-02-18 – 2014-02-19 (×2): 81 mg via ORAL
  Filled 2014-02-17 (×2): qty 1

## 2014-02-17 MED ORDER — METOPROLOL TARTRATE 1 MG/ML IV SOLN: 2.0000 mg | INTRAVENOUS | Status: DC | PRN

## 2014-02-17 MED ORDER — LIDOCAINE HCL 4 % MT SOLN
OROMUCOSAL | Status: DC | PRN
  Administered 2014-02-17: 4 mL via TOPICAL

## 2014-02-17 MED ORDER — MUPIROCIN 2 % EX OINT
1.0000 "application " | TOPICAL_OINTMENT | Freq: Once | CUTANEOUS | Status: AC
  Administered 2014-02-17: 1 via TOPICAL

## 2014-02-17 MED ORDER — FINASTERIDE 5 MG PO TABS
5.0000 mg | ORAL_TABLET | Freq: Every day | ORAL | Status: DC
  Administered 2014-02-17 – 2014-02-18 (×2): 5 mg via ORAL
  Filled 2014-02-17 (×3): qty 1

## 2014-02-17 MED ORDER — HEPARIN SODIUM (PORCINE) 1000 UNIT/ML IJ SOLN
INTRAMUSCULAR | Status: DC | PRN
  Administered 2014-02-17: 6000 [IU] via INTRAVENOUS

## 2014-02-17 MED ORDER — ONDANSETRON HCL 4 MG/2ML IJ SOLN
4.0000 mg | Freq: Four times a day (QID) | INTRAMUSCULAR | Status: DC | PRN
Start: 2014-02-17 — End: 2014-02-19
  Administered 2014-02-17 – 2014-02-18 (×2): 4 mg via INTRAVENOUS
  Filled 2014-02-17 (×2): qty 2

## 2014-02-17 MED ORDER — PROMETHAZINE HCL 25 MG/ML IJ SOLN
6.2500 mg | INTRAMUSCULAR | Status: DC | PRN
Start: 2014-02-17 — End: 2014-02-17

## 2014-02-17 MED ORDER — LABETALOL HCL 5 MG/ML IV SOLN
INTRAVENOUS | Status: AC
  Filled 2014-02-17: qty 4

## 2014-02-17 MED ORDER — CLONIDINE HCL 0.1 MG PO TABS
0.1000 mg | ORAL_TABLET | Freq: Two times a day (BID) | ORAL | Status: DC
  Administered 2014-02-17 – 2014-02-19 (×4): 0.1 mg via ORAL
  Filled 2014-02-17 (×5): qty 1

## 2014-02-17 MED ORDER — NEOSTIGMINE METHYLSULFATE 10 MG/10ML IV SOLN
INTRAVENOUS | Status: AC
  Filled 2014-02-17: qty 1

## 2014-02-17 MED ORDER — MIDAZOLAM HCL 2 MG/2ML IJ SOLN
INTRAMUSCULAR | Status: AC
  Filled 2014-02-17: qty 2

## 2014-02-17 MED ORDER — FENTANYL CITRATE 0.05 MG/ML IJ SOLN
INTRAMUSCULAR | Status: AC
  Administered 2014-02-17: 25 ug via INTRAVENOUS
  Filled 2014-02-17: qty 2

## 2014-02-17 MED ORDER — OXYCODONE HCL 5 MG/5ML PO SOLN: 5.0000 mg | Freq: Once | ORAL | Status: DC | PRN

## 2014-02-17 MED ORDER — FENTANYL CITRATE 0.05 MG/ML IJ SOLN
25.0000 ug | INTRAMUSCULAR | Status: DC | PRN
  Administered 2014-02-17 (×3): 25 ug via INTRAVENOUS

## 2014-02-17 MED ORDER — DEXAMETHASONE SODIUM PHOSPHATE 10 MG/ML IJ SOLN
INTRAMUSCULAR | Status: DC | PRN
  Administered 2014-02-17: 4 mg via INTRAVENOUS

## 2014-02-17 MED ORDER — POTASSIUM CHLORIDE CRYS ER 20 MEQ PO TBCR: 20.0000 meq | EXTENDED_RELEASE_TABLET | Freq: Every day | ORAL | Status: DC | PRN

## 2014-02-17 MED ORDER — MUPIROCIN 2 % EX OINT
TOPICAL_OINTMENT | CUTANEOUS | Status: AC
  Filled 2014-02-17: qty 22

## 2014-02-17 MED ORDER — OXYCODONE HCL 5 MG PO TABS
5.0000 mg | ORAL_TABLET | ORAL | Status: DC | PRN
  Administered 2014-02-18: 10 mg via ORAL
  Filled 2014-02-17: qty 2

## 2014-02-17 MED ORDER — ACETAMINOPHEN 325 MG PO TABS: 325.0000 mg | ORAL_TABLET | ORAL | Status: DC | PRN

## 2014-02-17 MED ORDER — PANTOPRAZOLE SODIUM 40 MG PO TBEC
40.0000 mg | DELAYED_RELEASE_TABLET | Freq: Every day | ORAL | Status: DC
  Administered 2014-02-17 – 2014-02-19 (×3): 40 mg via ORAL
  Filled 2014-02-17 (×3): qty 1

## 2014-02-17 MED ORDER — LACTATED RINGERS IV SOLN: INTRAVENOUS | Status: DC

## 2014-02-17 MED ORDER — VERAPAMIL HCL ER 180 MG PO TBCR
180.0000 mg | EXTENDED_RELEASE_TABLET | Freq: Every day | ORAL | Status: DC
  Administered 2014-02-17 – 2014-02-19 (×3): 180 mg via ORAL
  Filled 2014-02-17 (×3): qty 1

## 2014-02-17 MED ORDER — DOPAMINE-DEXTROSE 3.2-5 MG/ML-% IV SOLN
3.0000 ug/kg/min | INTRAVENOUS | Status: DC
Start: 2014-02-17 — End: 2014-02-19

## 2014-02-17 MED ORDER — SUCCINYLCHOLINE CHLORIDE 20 MG/ML IJ SOLN
INTRAMUSCULAR | Status: DC | PRN
  Administered 2014-02-17: 120 mg via INTRAVENOUS

## 2014-02-17 MED ORDER — SODIUM CHLORIDE 0.9 % IR SOLN
Status: DC | PRN
  Administered 2014-02-17: 11:00:00

## 2014-02-17 MED ORDER — GLYCOPYRROLATE 0.2 MG/ML IJ SOLN
INTRAMUSCULAR | Status: AC
  Filled 2014-02-17: qty 2

## 2014-02-17 MED ORDER — FENTANYL CITRATE 0.05 MG/ML IJ SOLN
INTRAMUSCULAR | Status: DC | PRN
  Administered 2014-02-17: 150 ug via INTRAVENOUS
  Administered 2014-02-17: 100 ug via INTRAVENOUS

## 2014-02-17 MED ORDER — ONDANSETRON HCL 4 MG/2ML IJ SOLN
INTRAMUSCULAR | Status: DC | PRN
  Administered 2014-02-17: 4 mg via INTRAVENOUS

## 2014-02-17 MED ORDER — SODIUM CHLORIDE 0.9 % IV SOLN: 500.0000 mL | Freq: Once | INTRAVENOUS | Status: AC | PRN

## 2014-02-17 MED ORDER — LIDOCAINE HCL (PF) 1 % IJ SOLN
INTRAMUSCULAR | Status: AC
  Filled 2014-02-17: qty 30

## 2014-02-17 MED ORDER — PROTAMINE SULFATE 10 MG/ML IV SOLN
INTRAVENOUS | Status: DC | PRN
  Administered 2014-02-17: 50 mg via INTRAVENOUS

## 2014-02-17 MED ORDER — ESMOLOL HCL 10 MG/ML IV SOLN
INTRAVENOUS | Status: AC
  Filled 2014-02-17: qty 10

## 2014-02-17 MED ORDER — LISINOPRIL 40 MG PO TABS
40.0000 mg | ORAL_TABLET | Freq: Every day | ORAL | Status: DC
  Administered 2014-02-17 – 2014-02-18 (×2): 40 mg via ORAL
  Filled 2014-02-17 (×3): qty 1

## 2014-02-17 MED ORDER — SIMVASTATIN 10 MG PO TABS
10.0000 mg | ORAL_TABLET | Freq: Every day | ORAL | Status: DC
  Administered 2014-02-17 – 2014-02-18 (×2): 10 mg via ORAL
  Filled 2014-02-17 (×3): qty 1

## 2014-02-17 MED ORDER — MORPHINE SULFATE 2 MG/ML IJ SOLN
2.0000 mg | INTRAMUSCULAR | Status: DC | PRN
  Administered 2014-02-17 – 2014-02-18 (×4): 2 mg via INTRAVENOUS
  Filled 2014-02-17 (×4): qty 1

## 2014-02-17 MED ORDER — HYDROCHLOROTHIAZIDE 25 MG PO TABS
25.0000 mg | ORAL_TABLET | Freq: Every day | ORAL | Status: DC
  Administered 2014-02-18 – 2014-02-19 (×2): 25 mg via ORAL
  Filled 2014-02-17 (×3): qty 1

## 2014-02-17 MED ORDER — ROCURONIUM BROMIDE 100 MG/10ML IV SOLN
INTRAVENOUS | Status: DC | PRN
  Administered 2014-02-17: 10 mg via INTRAVENOUS
  Administered 2014-02-17: 40 mg via INTRAVENOUS

## 2014-02-17 MED ORDER — CEFUROXIME SODIUM 1.5 G IJ SOLR
1.5000 g | Freq: Two times a day (BID) | INTRAMUSCULAR | Status: AC
  Administered 2014-02-17 – 2014-02-18 (×2): 1.5 g via INTRAVENOUS
  Filled 2014-02-17 (×2): qty 1.5

## 2014-02-17 MED ORDER — SODIUM CHLORIDE 0.9 % IV SOLN: INTRAVENOUS | Status: DC

## 2014-02-17 MED ORDER — OXYCODONE HCL 5 MG PO TABS: 5.0000 mg | ORAL_TABLET | Freq: Once | ORAL | Status: DC | PRN

## 2014-02-17 MED ORDER — DEXAMETHASONE SODIUM PHOSPHATE 4 MG/ML IJ SOLN
INTRAMUSCULAR | Status: AC
  Filled 2014-02-17: qty 1

## 2014-02-17 MED ORDER — PROTAMINE SULFATE 10 MG/ML IV SOLN
INTRAVENOUS | Status: AC
  Filled 2014-02-17: qty 5

## 2014-02-17 MED ORDER — FENTANYL CITRATE 0.05 MG/ML IJ SOLN
INTRAMUSCULAR | Status: AC
  Filled 2014-02-17: qty 5

## 2014-02-17 MED ORDER — PROPOFOL 10 MG/ML IV BOLUS
INTRAVENOUS | Status: DC | PRN
  Administered 2014-02-17: 150 mg via INTRAVENOUS

## 2014-02-17 MED ORDER — HEPARIN SODIUM (PORCINE) 1000 UNIT/ML IJ SOLN
INTRAMUSCULAR | Status: AC
  Filled 2014-02-17: qty 1

## 2014-02-17 MED ORDER — LACTATED RINGERS IV SOLN
INTRAVENOUS | Status: DC | PRN
  Administered 2014-02-17: 10:00:00 via INTRAVENOUS

## 2014-02-17 SURGICAL SUPPLY — 40 items
BLADE SURG 10 STRL SS (BLADE) ×3 IMPLANT
CANISTER SUCTION 2500CC (MISCELLANEOUS) ×3 IMPLANT
CATH ROBINSON RED A/P 18FR (CATHETERS) ×3 IMPLANT
CATH SUCT 10FR WHISTLE TIP (CATHETERS) ×3 IMPLANT
CLIP TI MEDIUM 24 (CLIP) ×3 IMPLANT
CLIP TI WIDE RED SMALL 24 (CLIP) ×3 IMPLANT
CRADLE DONUT ADULT HEAD (MISCELLANEOUS) ×3 IMPLANT
DECANTER SPIKE VIAL GLASS SM (MISCELLANEOUS) IMPLANT
DRAIN HEMOVAC 1/8 X 5 (WOUND CARE) IMPLANT
DRSG COVADERM 4X8 (GAUZE/BANDAGES/DRESSINGS) ×3 IMPLANT
ELECT REM PT RETURN 9FT ADLT (ELECTROSURGICAL) ×3
ELECTRODE REM PT RTRN 9FT ADLT (ELECTROSURGICAL) ×1 IMPLANT
EVACUATOR SILICONE 100CC (DRAIN) IMPLANT
GAUZE SPONGE 4X4 12PLY STRL (GAUZE/BANDAGES/DRESSINGS) ×3 IMPLANT
GLOVE BIOGEL PI IND STRL 6.5 (GLOVE) ×1 IMPLANT
GLOVE BIOGEL PI INDICATOR 6.5 (GLOVE) ×2
GLOVE ECLIPSE 6.5 STRL STRAW (GLOVE) ×3 IMPLANT
GLOVE SS BIOGEL STRL SZ 7 (GLOVE) ×1 IMPLANT
GLOVE SUPERSENSE BIOGEL SZ 7 (GLOVE) ×2
GOWN STRL REUS W/ TWL LRG LVL3 (GOWN DISPOSABLE) ×3 IMPLANT
GOWN STRL REUS W/TWL LRG LVL3 (GOWN DISPOSABLE) ×6
INSERT FOGARTY SM (MISCELLANEOUS) ×3 IMPLANT
KIT BASIN OR (CUSTOM PROCEDURE TRAY) ×3 IMPLANT
KIT ROOM TURNOVER OR (KITS) ×3 IMPLANT
NEEDLE 22X1 1/2 (OR ONLY) (NEEDLE) IMPLANT
NS IRRIG 1000ML POUR BTL (IV SOLUTION) ×6 IMPLANT
PACK CAROTID (CUSTOM PROCEDURE TRAY) ×3 IMPLANT
PAD ARMBOARD 7.5X6 YLW CONV (MISCELLANEOUS) ×6 IMPLANT
PATCH HEMASHIELD 8X75 (Vascular Products) ×3 IMPLANT
SHUNT CAROTID BYPASS 12FRX15.5 (VASCULAR PRODUCTS) IMPLANT
SPONGE INTESTINAL PEANUT (DISPOSABLE) ×3 IMPLANT
SUT PROLENE 6 0 CC (SUTURE) ×15 IMPLANT
SUT SILK 2 0 FS (SUTURE) ×3 IMPLANT
SUT SILK 3 0 TIES 17X18 (SUTURE)
SUT SILK 3-0 18XBRD TIE BLK (SUTURE) IMPLANT
SUT VIC AB 2-0 CT1 27 (SUTURE) ×2
SUT VIC AB 2-0 CT1 TAPERPNT 27 (SUTURE) ×1 IMPLANT
SUT VIC AB 3-0 X1 27 (SUTURE) ×3 IMPLANT
SYR CONTROL 10ML LL (SYRINGE) IMPLANT
WATER STERILE IRR 1000ML POUR (IV SOLUTION) ×3 IMPLANT

## 2014-02-17 NOTE — Anesthesia Procedure Notes (Signed)
Procedure Name: Intubation Date/Time: 02/17/2014 10:47 AM Performed by: Izora Gala Pre-anesthesia Checklist: Patient identified, Emergency Drugs available, Suction available and Patient being monitored Patient Re-evaluated:Patient Re-evaluated prior to inductionOxygen Delivery Method: Circle system utilized Preoxygenation: Pre-oxygenation with 100% oxygen Intubation Type: IV induction Ventilation: Mask ventilation with difficulty, Oral airway inserted - appropriate to patient size and Mask ventilation without difficulty Laryngoscope Size: Miller and 3 Grade View: Grade I Tube type: Oral Tube size: 8.0 mm Number of attempts: 1 Airway Equipment and Method: Stylet,  LTA kit utilized and Bite block Placement Confirmation: ETT inserted through vocal cords under direct vision,  positive ETCO2 and breath sounds checked- equal and bilateral Secured at: 22 cm Tube secured with: Tape Dental Injury: Teeth and Oropharynx as per pre-operative assessment  Comments: Two hand mask ventilation required because of beard.  Short acting paralytic used as well.

## 2014-02-17 NOTE — Progress Notes (Signed)
Pt bladder scanned result of 917 ml. Pt. Voided 200 ml of urine following scan.

## 2014-02-17 NOTE — Anesthesia Preprocedure Evaluation (Addendum)
Anesthesia Evaluation  Patient identified by MRN, date of birth, ID band Patient awake    Reviewed: Allergy & Precautions, H&P , NPO status , Patient's Chart, lab work & pertinent test results  Airway Mallampati: III TM Distance: >3 FB Neck ROM: Full    Dental  (+) Teeth Intact, Dental Advisory Given   Pulmonary sleep apnea and Continuous Positive Airway Pressure Ventilation , former smoker,  breath sounds clear to auscultation        Cardiovascular hypertension, Pt. on medications + CAD and + Peripheral Vascular Disease Rhythm:Regular Rate:Normal  9/15 TTE: EF 55-60%, Mild TR, grade II diastolic dysfunction, mildly elevated PA pressures.   Neuro/Psych negative neurological ROS  negative psych ROS   GI/Hepatic Neg liver ROS, GERD-  ,  Endo/Other  Morbid obesity  Renal/GU CRFRenal disease     Musculoskeletal  (+) Arthritis -,   Abdominal   Peds  Hematology negative hematology ROS (+)   Anesthesia Other Findings   Reproductive/Obstetrics negative OB ROS                        Anesthesia Physical Anesthesia Plan  ASA: III  Anesthesia Plan: General   Post-op Pain Management:    Induction: Intravenous  Airway Management Planned: Oral ETT  Additional Equipment: Arterial line  Intra-op Plan:   Post-operative Plan: Extubation in OR  Informed Consent: I have reviewed the patients History and Physical, chart, labs and discussed the procedure including the risks, benefits and alternatives for the proposed anesthesia with the patient or authorized representative who has indicated his/her understanding and acceptance.   Dental advisory given  Plan Discussed with: CRNA, Anesthesiologist and Surgeon  Anesthesia Plan Comments: (Plan for GETA, +aline.)       Anesthesia Quick Evaluation

## 2014-02-17 NOTE — Anesthesia Postprocedure Evaluation (Signed)
  Anesthesia Post-op Note  Patient: Caleb Wolf  Procedure(s) Performed: Procedure(s): ENDARTERECTOMY CAROTID WITH PATCH ANGIOPLASTY (Left)  Patient Location: PACU  Anesthesia Type:General  Level of Consciousness: awake, alert  and oriented  Airway and Oxygen Therapy: Patient Spontanous Breathing  Post-op Pain: mild  Post-op Assessment: Post-op Vital signs reviewed  Post-op Vital Signs: Reviewed  Last Vitals:  Filed Vitals:   02/17/14 1500  BP:   Pulse: 71  Temp: 36.5 C  Resp: 14    Complications: No apparent anesthesia complications

## 2014-02-17 NOTE — Op Note (Addendum)
OPERATIVE REPORT  Date of Surgery: 02/17/2014  Surgeon: Tinnie Gens, MD  Assistant: Dionicio Stall Preop diagnosis-severe left ICA stenosis  --assymptomatic   Postoperative diagnosis-same  Procedure: Procedure(s): Left carotid endarterectomy with resection of redundant segment of left internal carotid artery with primary reanastomosis and patch angioplasty  Anesthesia: General  EBL: 462 cc  Complications: None  The patient was taken the operating room placed in the supine position at which time satisfactory general endotracheal anesthesia was administered. The left neck was prepped with Betadine scrub and solution draped in a routine sterile manner. A longitudinal incision was made along the anterior border of sternocleidomastoid muscle carried down through subcutaneous tissue and platysma using the Bovie. The common internal and external carotid arteries were dissected free encircled with umbilical tapes. There is a heavily calcified plaque at the carotid bifurcation extending into the internal carotid. There was significant redundancy and kinking of the internal carotid artery about 4 cm distal to the bifurcation. A #10 shunt was repaired and the patient was heparinized. Carotid vessels were occluded with vascular clamps longitudinal opening made in the common carotid 15 blade and extended up the internal carotid with Potts scissors to a point distal to the disease. The plaque was causing an approximate 90% stenosis. Distal vessel appeared normal. #10 shunt was inserted without difficulty reestablishing flow at about 2 minutes. Standard endarterectomy was then performed using the elevator and Potts scissors with an eversion endarterectomy of external carotid. The plaque feathered off the distal internal carotid artery nicely not requiring any tacking sutures. The lumen was thoroughly irrigated with heparin saline and then a segment of internal carotid artery was resected using 4 tacking  sutures of 6-0 Prolene to mark the vessel. Between 1 and 2 cm of artery was excised and the artery was reanastomosed end to end with 6-0 Prolene. Following completion of this the background patch was sewn in place. Prior to completion of closure the shunt was removed after about 55 minutes shunt time and following antegrade and retrograde flushing the closure was completed with reestablishment of flow initially up the external and the internal branch. Carotid was occluded for less than 2 minutes for removal of the shunt. There was excellent Doppler flow in the external and internal carotid artery and the kink has been effectively a laminated. Adequate hemostasis was achieved after giving protamine and the wounds irrigated with saline and closed in layers with Vicryl in a subcuticular fashion the patient to the recovery room in stable condition  Procedure Details:   Tinnie Gens, MD 02/17/2014 1:05 PM

## 2014-02-17 NOTE — Consult Note (Addendum)
Vascular Surgery Consultation  Reason for Consult: Severe left ICA stenosis next asymptomatic  HPI: Caleb Wolf is a 73 y.o. male who presents for evaluation of severe left ICA stenosis. Patient had duplex scan in [place] which revealed 80% left ICA stenosis and 60-70% right ICA stenosis. The severe left ICA stenosis was confirmed and the right ICA was totally occluded .patient denies any previous neurologic symptoms. He had preoperative cardiac evaluation because of some recent chest discomfort and was cleared for left carotid surgery   Past Medical History  Diagnosis Date  . Coronary artery disease   . GERD (gastroesophageal reflux disease)   . Arthritis   . Hyperlipidemia     takes Simvastatin daily  . Carotid stenosis     07/04/11- 50-70%bilaterally- stable per Dr Willey Blade note  . Lower leg edema   . Lumbar disc disease   . Gout     takes Indocin daily as needed  . Enlarged prostate     takes Finasteride daily  . History of kidney stones   . PONV (postoperative nausea and vomiting)     also hard to urinate after surgery  . Sleep apnea     uses CPAP  . Hypertension     takes Clonidine,Verapamil,HCTZ,and Lisinopril daily  . Joint pain   . Chronic back pain   . History of colon polyps   . Urinary urgency    Past Surgical History  Procedure Laterality Date  . Fracture surgery      bilateral wrist fractures- one ORIF  . Rhinoplasty    . Joint replacement  2011    left knee  . Eye surgery      cataract extraction with repair macular tear , with IOL     right  . Total hip arthroplasty  12/08/2011    Procedure: TOTAL HIP ARTHROPLASTY;  Surgeon: Gearlean Alf, MD;  Location: [facility] ORS;  Service: Orthopedics;  Laterality: Right;  . Tonsillectomy    . Wrist surgery Right 24yrs ago  . Lithotripsy    . Back surgery    . Wrist surgery Left   . Cataract surgery Right   . Colonoscopy    . Pars plana vitrectomy w/ repair of macular hole     History   Social History  .  Marital Status: Married    Spouse Name: N/A    Number of Children: N/A  . Years of Education: N/A   Occupational History  . retired     [employer]   Social History Main Topics  . Smoking status: Former Smoker -- 2.00 packs/day for 25 years    Types: Cigarettes    Start date: 08/08/1960  . Smokeless tobacco: Never Used     Comment: quit smoking 30+yrs ago  . Alcohol Use: Yes     Comment: occasionally   . Drug Use: No  . Sexual Activity: Yes   Other Topics Concern  . None   Social History Narrative  . None   Family History  Problem Relation Age of Onset  . Allergies Mother   . Heart disease Mother   . Hypertension Mother   . Heart disease Father     MI   Allergies  Allergen Reactions  . Codeine Sulfate Nausea Only   Prior to Admission medications   Medication Sig Start Date End Date Taking? Authorizing Provider  aspirin 81 MG tablet Take 81 mg by mouth daily.   Yes Historical Provider, MD  cloNIDine (CATAPRES) 0.1 MG tablet  Take 0.1 mg by mouth 2 (two) times daily.   Yes Historical Provider, MD  finasteride (PROSCAR) 5 MG tablet Take 5 mg by mouth at bedtime.    Yes Historical Provider, MD  hydrochlorothiazide (HYDRODIURIL) 25 MG tablet Take 25 mg by mouth daily with breakfast.   Yes Historical Provider, MD  lisinopril (PRINIVIL,ZESTRIL) 40 MG tablet Take 40 mg by mouth at bedtime.    Yes Historical Provider, MD  simvastatin (ZOCOR) 10 MG tablet Take 10 mg by mouth at bedtime.   Yes Historical Provider, MD  TESTIM 50 PY/0DX GEL Apply 2 clicks as needed up three times weekly 07/26/12  Yes Historical Provider, MD  verapamil (CALAN-SR) 180 MG CR tablet Take 180 mg by mouth daily.    Yes Historical Provider, MD  indomethacin (INDOCIN) 50 MG capsule Take 50 mg by mouth 2 (two) times daily as needed (for gout attack). Will take for 2-3 days during attack    Historical Provider, MD  naproxen sodium (ALEVE) 220 MG tablet Take 220 mg by mouth as needed.      Historical Provider, MD  nitroGLYCERIN (NITROSTAT) 0.4 MG SL tablet Place 1 tablet (0.4 mg total) under the tongue every 5 (five) minutes as needed for chest pain. 02/15/14   Herminio Commons, MD     Positive ROS:   All other systems have been reviewed and were otherwise negative with the exception of those mentioned in the HPI and as above.  Physical Exam: Filed Vitals:   02/17/14 0945  BP: 174/55  Pulse: 76  Temp: 97 F (36.1 C)  Resp: 18    General: Alert, no acute distress HEENT: Normal for age Cardiovascular: Regular rate and rhythm. Carotid pulses 2+, no bruits audible Respiratory: Clear to auscultation. No cyanosis, no use of accessory musculature GI: No organomegaly, abdomen is soft and non-tender Skin: No lesions in the area of chief complaint Neurologic: Sensation intact distally Psychiatric: Patient is competent for consent with normal mood and affect Musculoskeletal: No obvious deformities Extremities: Good strength bilaterally with good pulses 3+ upper and lower extremities    Imaging reviewed: Severe left ICA stenosis 80-90% with right ICA occlusion   Assessment/Plan:  Plan left carotid endarterectomy patch risks and benefits thoroughly discussed and patient would like to proceed   Tinnie Gens, MD 02/17/2014 10:20 AM

## 2014-02-17 NOTE — Progress Notes (Signed)
CARE MANAGEMENT NOTE 02/17/2014  Patient:  Caleb Wolf   Account Number:  [id number]  Date Initiated:  02/17/2014  Documentation initiated by:  Fenna Semel  Subjective/Objective Assessment:   Severe left ICA stenosis next asymptomatic     Action/Plan:   home when stable   Anticipated DC Date:  02/20/2014   Anticipated DC Plan:  [place]  In-house referral  NA      DC Planning Services  NA      [facility] Choice  NA   Choice offered to / List presented to:  NA   DME arranged  NA      DME agency  NA     [place] arranged  NA      [place] agency  NA   Status of service:  In process, will continue to follow Medicare Important Message given?   (If response is "NO", the following Medicare IM given date fields will be blank) Date Medicare IM given:   Medicare IM given by:   Date Additional Medicare IM given:   Additional Medicare IM given by:    Discharge Disposition:    Per UR Regulation:  Reviewed for med. necessity/level of care/duration of stay  If discussed at [place] of Stay Meetings, dates discussed:    Comments:  [id number]/Maleigh Bagot,RN,BSN,CCM

## 2014-02-17 NOTE — Telephone Encounter (Signed)
notified patient of post op appt. with dr. Kellie Simmering on 03-07-14 at 8:45 am

## 2014-02-17 NOTE — Transfer of Care (Signed)
Immediate Anesthesia Transfer of Care Note  Patient: Caleb Wolf  Procedure(s) Performed: Procedure(s): ENDARTERECTOMY CAROTID WITH PATCH ANGIOPLASTY (Left)  Patient Location: PACU  Anesthesia Type:General  Level of Consciousness: awake, alert  and patient cooperative  Airway & Oxygen Therapy: Patient Spontanous Breathing and Patient connected to face mask oxygen  Post-op Assessment: Report given to PACU RN, Post -op Vital signs reviewed and stable, Patient moving all extremities, Patient moving all extremities X 4 and Patient able to stick tongue midline  Post vital signs: Reviewed and stable  Complications: No apparent anesthesia complications

## 2014-02-17 NOTE — Progress Notes (Signed)
Pt SBP running 190s. PA [place] notified. Orders received. Will continue to monitor.

## 2014-02-17 NOTE — Progress Notes (Signed)
  [facility] Day of Surgery Note  Subjective:  Doing well. Has been sipping liquids without difficulty.  Filed Vitals:   02/17/14 1634  BP: 176/146  Pulse: 82  Temp: 97.1 F (36.2 C)  Resp: 17    Incisions:  Left neck incision dressed with no drainage seen.  Extremities:  5/5 strength upper and lower extremities bilaterally Neuro: no smile asymmetry, no tongue deviation.   Assessment/Plan:  This is a 73 y.o. male who is s/p left carotid endarterectomy with resection of redundant segment of left internal carotid artery with primary reanastomosis and patch angioplasty  Day of Surgery   -Doing well. Neuro exam intact. Needs to ambulate and void. Anticipate discharge tomorrow.    Virgina Jock, [place] Pager: [phone] 02/17/2014 5:32 PM

## 2014-02-18 LAB — BASIC METABOLIC PANEL
Anion gap: 16 — ABNORMAL HIGH (ref 5–15)
BUN: 26 mg/dL — AB (ref 6–23)
CO2: 21 meq/L (ref 19–32)
CREATININE: 1.39 mg/dL — AB (ref 0.50–1.35)
Calcium: 8.8 mg/dL (ref 8.4–10.5)
Chloride: 104 mEq/L (ref 96–112)
GFR calc Af Amer: 57 mL/min — ABNORMAL LOW (ref >90)
GFR calc non Af Amer: 49 mL/min — ABNORMAL LOW (ref >90)
Glucose, Bld: 134 mg/dL — ABNORMAL HIGH (ref 70–99)
Potassium: 4.9 mEq/L (ref 3.7–5.3)
Sodium: 141 mEq/L (ref 137–147)

## 2014-02-18 LAB — CBC
HEMATOCRIT: 39.4 % (ref 39.0–52.0)
Hemoglobin: 13.1 g/dL (ref 13.0–17.0)
MCH: 30.4 pg (ref 26.0–34.0)
MCHC: 33.2 g/dL (ref 30.0–36.0)
MCV: 91.4 fL (ref 78.0–100.0)
Platelets: 206 10*3/uL (ref 150–400)
RBC: 4.31 MIL/uL (ref 4.22–5.81)
RDW: 13.3 % (ref 11.5–15.5)
WBC: 12.3 10*3/uL — ABNORMAL HIGH (ref 4.0–10.5)

## 2014-02-18 MED ORDER — PROMETHAZINE HCL 25 MG/ML IJ SOLN
12.5000 mg | Freq: Once | INTRAMUSCULAR | Status: AC
  Administered 2014-02-18: 12.5 mg via INTRAVENOUS
  Filled 2014-02-18 (×2): qty 1

## 2014-02-18 MED ORDER — MUPIROCIN 2 % EX OINT
TOPICAL_OINTMENT | Freq: Two times a day (BID) | CUTANEOUS | Status: DC
  Administered 2014-02-18 – 2014-02-19 (×2): 1 via TOPICAL
  Filled 2014-02-18: qty 22

## 2014-02-18 MED ORDER — MUPIROCIN CALCIUM 2 % EX CREA
TOPICAL_CREAM | Freq: Two times a day (BID) | CUTANEOUS | Status: DC
  Administered 2014-02-18: via TOPICAL

## 2014-02-18 NOTE — Progress Notes (Signed)
Pt ambulated around unit x 2 with no noted difficulty or complaints; VSS; will continue to monitor

## 2014-02-18 NOTE — Progress Notes (Signed)
Subjective: Interval History: none.. comfortable overnight. Had difficulty with urinary retention requiring Foley catheter. This was discharged at 5 AM and he is yet to void. Also had nausea and vomiting on several occasions but this is somewhat better currently.  Objective: Vital signs in last 24 hours: Temp:  [97.1 F (36.2 C)-98.4 F (36.9 C)] 98.1 F (36.7 C) (09/19 0818) Pulse Rate:  [64-85] 85 (09/19 0300) Resp:  [12-27] 15 (09/19 0818) BP: (142-228)/(43-146) 162/56 mmHg (09/19 0818) SpO2:  [93 %-100 %] 94 % (09/19 0818) Arterial Line BP: (136-193)/(50-107) 166/107 mmHg (09/18 2340) Weight:  [287 lb 11.2 oz (130.5 kg)] 287 lb 11.2 oz (130.5 kg) (09/18 1535)  Intake/Output from previous day: 09/18 0701 - 09/19 0700 In: 2323.3 [I.V.:2273.3; IV Piggyback:50] Out: 9518 [ACZYS:0630; Blood:150] Intake/Output this shift:    Neck without hematoma. Neurologically he is grossly intact  Lab Results:  Recent Labs  02/16/14 1333 02/18/14 0245  WBC 9.5 12.3*  HGB 14.3 13.1  HCT 43.1 39.4  PLT 219 206   BMET  Recent Labs  02/16/14 1333 02/18/14 0245  NA 139 141  K 5.0 4.9  CL 100 104  CO2 24 21  GLUCOSE 85 134*  BUN 28* 26*  CREATININE 1.38* 1.39*  CALCIUM 9.5 8.8    Studies/Results: Dg Chest 2 View  02/16/2014   CLINICAL DATA:  Sleep apnea  EXAM: CHEST  2 VIEW  COMPARISON:  Chest radiograph 12/01/2011  FINDINGS: Stable cardiac and mediastinal contours with tortuosity of the thoracic aorta. No consolidative pulmonary opacities. No pleural effusion or pneumothorax. Mid thoracic spine degenerative change.  IMPRESSION: No acute cardiopulmonary process.   Electronically Signed   By: Lovey Newcomer [name]   On: 02/16/2014 15:51   Nm Myocar Multi W/spect W/wall Motion / Ef  02/14/2014   CLINICAL DATA:  72 year old male with carotid artery stenosis, hypertension, hyperlipidemia and family history of premature coronary artery disease who has been experiencing chest pain and is  referred for an ischemic evaluation.  EXAM: MYOCARDIAL IMAGING WITH SPECT (REST AND PHARMACOLOGIC-STRESS - 2 DAY PROTOCOL)  GATED LEFT VENTRICULAR WALL MOTION STUDY  LEFT VENTRICULAR EJECTION FRACTION  TECHNIQUE: Standard myocardial SPECT imaging was performed after resting intravenous injection of 10 mCi Tc-68m sestamibi. Subsequently, on a second day, intravenous infusion of Lexiscan was performed under the supervision of the Cardiology staff. At peak effect of the drug, 30 mCi Tc-11m sestamibi was injected intravenously and standard myocardial SPECT imaging was performed. Quantitative gated imaging was also performed to evaluate left ventricular wall motion, and estimate left ventricular ejection fraction.  COMPARISON:  None.  FINDINGS: ECG/stress data: The patient was stressed according to [facility] protocol. The heart rate ranged from 37 to 53 beats per min. The blood pressure was in the 101-108/46 mmHg range. No chest pain was reported.  Baseline tracing demonstrated sinus bradycardia, heart rate 37 beats per min. Isolated PVCs noted during stress. There are no ischemic ST segment or T-wave abnormalities.  Perfusion: Raw data did not demonstrate any increased extracardiac radiotracer uptake. No decreased activity in the left ventricle on stress imaging to suggest reversible ischemia or infarction.  Wall Motion: Normal left ventricular wall motion. No left ventricular dilation.  Left Ventricular Ejection Fraction: 47 %  End diastolic volume 160 ml  End systolic volume 65 ml  IMPRESSION: 1. No reversible ischemia or infarction.  2. Normal left ventricular wall motion.  3. Left ventricular ejection fraction 47%. However, there appeared to be grossly normal LV systolic function. Would  recommend echocardiogram for more accurate LVEF assessment.  4. Low-risk stress test findings*.  *2012 Appropriate Use Criteria for Coronary Revascularization Focused Update: J Am Coll Cardiol. 7048;88(9):169-450.  http://content.airportbarriers.com.aspx?articleid=[email or web address]   Electronically Signed   By: Kate Sable   On: 02/14/2014 10:33   Anti-infectives: Anti-infectives   Start     Dose/Rate Route Frequency Ordered Stop   02/17/14 2100  cefUROXime (ZINACEF) 1.5 g in dextrose 5 % 50 mL IVPB     1.5 g 100 mL/hr over 30 Minutes Intravenous Every 12 hours 02/17/14 1527 02/18/14 2059   02/16/14 1414  cefUROXime (ZINACEF) 1.5 g in dextrose 5 % 50 mL IVPB     1.5 g 100 mL/hr over 30 Minutes Intravenous 30 min pre-op 02/16/14 1414 02/17/14 1050      Assessment/Plan: s/p Procedure(s): ENDARTERECTOMY CAROTID WITH PATCH ANGIOPLASTY (Left) stable postop day 1. We'll mobilize. Potential discharge later today if comfortable with avoiding and with resolution of nausea appear   LOS: 1 day   Darolyn Double 02/18/2014, 10:25 AM

## 2014-02-19 LAB — TYPE AND SCREEN
ABO/RH(D): A POS
Antibody Screen: NEGATIVE
Unit division: 0
Unit division: 0

## 2014-02-19 NOTE — Progress Notes (Signed)
Patient ID: Caleb Wolf, male   DOB: 26-Apr-1941, 73 y.o.   MRN: [id number] Comfortable this morning Some mild nausea but dinner and breakfast without difficulty Voiding without difficulty Healing well and neurologically intact Discharge to home today

## 2014-02-20 ENCOUNTER — Encounter (HOSPITAL_COMMUNITY): Payer: Self-pay | Admitting: Vascular Surgery

## 2014-02-20 NOTE — Discharge Summary (Signed)
[facility] Discharge Summary  Caleb Wolf 1940/10/09 73 y.o. 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 male who had a duplex scan in [place] which revealed 80% left ICA stenosis and 60-70% right ICA stenosis. The severe left ICA stenosis was confirmed and the right ICA was totally occluded. The patient denied any previous neurologic symptoms. He had preoperative cardiac evaluation because of some recent chest discomfort and was cleared for left carotid surgery  Hospital Course:  The patient was admitted to the hospital and taken to the operating room on 02/17/2014 and underwent left carotid endarterectomy.  The patient tolerated the procedure well and was transported to the PACU in stable condition.  By POD 1, the patient's neuro status was intact. His left neck incision was intact without hematoma. He had some urinary retention requiring a foley catheter. He also had some nausea and vomiting overnight. He was mobilized more.   By POD 2, he was more comfortable with only mild nausea. He was able to tolerate his breakfast and void without difficulty. His neurological exam remained intact and was increasing mobilization. He was discharged home on POD 2 in good condition.     Recent Labs  02/18/14 0245  NA 141  K 4.9  CL 104  CO2 21  GLUCOSE 134*  BUN 26*  CALCIUM 8.8    Recent Labs  02/18/14 0245  WBC 12.3*  HGB 13.1  HCT 39.4  PLT 206   No results found for this basename: INR,  in the last 72 hours  Discharge Instructions:   The patient is discharged to home with extensive instructions on wound care and progressive ambulation.  They are instructed not to drive or perform any heavy lifting until returning to see the physician in his office.  Discharge Instructions   CAROTID  Sugery: Call MD for difficulty swallowing or speaking; weakness in arms or legs that is a new symtom; severe headache.  If you have increased swelling in the neck and/or  are having difficulty breathing, CALL 911    Complete by:  As directed      Call MD for:  redness, tenderness, or signs of infection (pain, swelling, bleeding, redness, odor or green/yellow discharge around incision site)    Complete by:  As directed      Call MD for:  severe or increased pain, loss or decreased feeling  in affected limb(s)    Complete by:  As directed      Call MD for:  temperature >100.5    Complete by:  As directed      Discharge wound care:    Complete by:  As directed   Shower daily with soap and water starting 02/19/14     Driving Restrictions    Complete by:  As directed   No driving for 2 weeks     Lifting restrictions    Complete by:  As directed   No lifting for 2 weeks     Resume previous diet    Complete by:  As directed            Discharge Diagnosis:  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 Chest pain 02/09/2014  . HTN (hypertension) 02/09/2014  . Carotid  stenosis 08/02/2013  . Edema 08/02/2013  . OSA (obstructive sleep apnea) 09/21/2012  . Difficulty in walking 01/29/2012  . Balance disorder 01/29/2012  . OA (osteoarthritis) of hip 12/08/2011   Past Medical History  Diagnosis Date  . Coronary artery disease   . GERD (gastroesophageal reflux disease)   . Arthritis   . Hyperlipidemia     takes Simvastatin daily  . Carotid stenosis     07/04/11- 50-70%bilaterally- stable per Dr Willey Blade note  . Lower leg edema   . Lumbar disc disease   . Gout     takes Indocin daily as needed  . Enlarged prostate     takes Finasteride daily  . History of kidney stones   . PONV (postoperative nausea and vomiting)     also hard to urinate after surgery  . Sleep apnea     uses CPAP  .  Hypertension     takes Clonidine,Verapamil,HCTZ,and Lisinopril daily  . Joint pain   . Chronic back pain   . History of colon polyps   . Urinary urgency       Medication List         ALEVE 220 MG tablet  Generic drug:  naproxen sodium  Take 220 mg by mouth as needed.     aspirin 81 MG tablet  Take 81 mg by mouth daily.     cloNIDine 0.1 MG tablet  Commonly known as:  CATAPRES  Take 0.1 mg by mouth 2 (two) times daily.     finasteride 5 MG tablet  Commonly known as:  PROSCAR  Take 5 mg by mouth at bedtime.     hydrochlorothiazide 25 MG tablet  Commonly known as:  HYDRODIURIL  Take 25 mg by mouth daily with breakfast.     indomethacin 50 MG capsule  Commonly known as:  INDOCIN  Take 50 mg by mouth 2 (two) times daily as needed (for gout attack). Will take for 2-3 days during attack     lisinopril 40 MG tablet  Commonly known as:  PRINIVIL,ZESTRIL  Take 40 mg by mouth at bedtime.     nitroGLYCERIN 0.4 MG SL tablet  Commonly known as:  NITROSTAT  Place 1 tablet (0.4 mg total) under the tongue every 5 (five) minutes as needed for chest pain.     oxyCODONE 5 MG immediate release tablet  Commonly known as:  ROXICODONE  Take 1 tablet (5 mg total) by mouth every 6 (six) hours as needed for severe pain.     simvastatin 10 MG tablet  Commonly known as:  ZOCOR  Take 10 mg by mouth at bedtime.     TESTIM 50 MG/5GM (1%) Gel  Generic drug:  testosterone  Apply 2 clicks as needed up three times weekly     verapamil 180 MG CR tablet  Commonly known as:  CALAN-SR  Take 180 mg by mouth daily.        Roxicodone #20 No Refill  Disposition: Home  Patient's condition: is Good  Follow up: 1. Dr.  Kellie Simmering in 2 weeks.   Virgina Jock, PA-C [facility] [phone]  --- For [facility] use --- Instructions: Press F2 to tab through selections.  Delete question if not applicable.   Modified Rankin score at D/C (0-6): 0  IV medication needed for:    1. Hypertension: Yes 2. Hypotension: No  Post-op Complications: No  1. Post-op CVA or TIA: No  2. CN injury: No  3. Myocardial infarction: No  4.  CHF: No  5.  Dysrhythmia (new): No  6. Wound infection: No  7. Reperfusion symptoms: No  8. Return to OR: No   Discharge medications: Statin use:  Yes If No: [ ]  For Medical reasons, [ ]  Non-compliant, [ ]  Not-indicated ASA use:  Yes  If No: [ ]  For Medical reasons, [ ]  Non-compliant, [ ]  Not-indicated Beta blocker use:  No If No: [ ]  For Medical reasons, [ ]  Non-compliant, [ ]  Not-indicated; on calcium channel blocker ACE-Inhibitor use:  Yes If No: [ ]  For Medical reasons, [ ]  Non-compliant, [ ]  Not-indicated P2Y12 Antagonist use: No, [ ]  Plavix, [ ]  Plasugrel, [ ]  Ticlopinine, [ ]  Ticagrelor, [ ]  Other, [ ]  No for medical reason, [ ]  Non-compliant, [x ] Not-indicated Anti-coagulant use:  No, [ ]  Warfarin, [ ]  Rivaroxaban, [ ]  Dabigatran, [ ]  Other, [ ]  No for medical reason, [ ]  Non-compliant, [x ] Not-indicated

## 2014-02-21 ENCOUNTER — Telehealth: Payer: Self-pay

## 2014-02-21 NOTE — Telephone Encounter (Signed)
Phone call from pt.  Reported he has a Urology appt. on 02/27/14, and questioned if needed to postpone this, since he just had surgery on 02/17/14.  Advised pt. it would be entirely his decision, depending on how he felt.  Advised him he would not be able to drive yet, following his carotid surgery.  Also reported a pain in the arch of right foot that felt like "gout."  Denied any redness/ inflammation/ swelling of right foot.  Stated he has had gout before, and thought the pain was similar.  Advised to continue to monitor for worsening symptoms.  Stated he is walking okay.  Also reported elevated BP: 172/92 this morning, and 152/72 at 12:30 PM, today.  Stated he is taking his blood pressure medication as has been prescribed.  Denies any headache, difficulty swallowing, difficulty with breathing, or fever/chills.  Stated has a little cough that developed yesterday.  Admits to throat feeling irritated since he had surgery.  Encour. gentle salt water gargles, throat lozenges, or to sip on hot tea with honey to soothe throat.  Encouraged to call with any concerns.  Verb. Understanding.

## 2014-02-28 ENCOUNTER — Telehealth: Payer: Self-pay | Admitting: *Deleted

## 2014-02-28 NOTE — Telephone Encounter (Signed)
Agree that these BP's typically don't result in symptoms which he has been experiencing. Would monitor for now.

## 2014-02-28 NOTE — Telephone Encounter (Signed)
Wife calling with concerns of low BP.  Stated patient had carotid surgery on 02/17/14.  Had been having high BP's up till now.    Saturday - 122/62  80 Sunday - 125/65  70, 121/61  62,  134/68  57 Monday - 118/65  71,  106/53  61,  112/60  67  -- feeling woozy/lightheaded this day  Does notice this with standing also.  Due to follow up with Dr. Kellie Simmering 03/07/14 & you again on 05/10/14.   Informed wife that these are not low BP's, but will send to provider for advice.

## 2014-03-02 DIAGNOSIS — Z96652 Presence of left artificial knee joint: Secondary | ICD-10-CM | POA: Diagnosis not present

## 2014-03-02 DIAGNOSIS — Z471 Aftercare following joint replacement surgery: Secondary | ICD-10-CM | POA: Diagnosis not present

## 2014-03-02 DIAGNOSIS — Z96641 Presence of right artificial hip joint: Secondary | ICD-10-CM | POA: Diagnosis not present

## 2014-03-06 ENCOUNTER — Encounter: Payer: Self-pay | Admitting: Vascular Surgery

## 2014-03-06 NOTE — Telephone Encounter (Signed)
Morning readings more elevated when compared to evening readings. Increase pm dose of clonidine to 0.2 mg.

## 2014-03-06 NOTE — Telephone Encounter (Signed)
Discussed below with patient.    9/30 - 8:00 am - 148/62  59           11:00 am - 126/60  76  10/1 - 8:30 am - 143/69  67           10:30 am - 135/69  70  10/2 - 9:00 am - 147/69  65           11:00 am - 146/66  71            7:00 pm - 139/69  72  10/3 -  8:20 am - 163/69  59            11:30 am - 152/61  72  10/4 -  8:50 am - 161/75  65            3:30 pm - 127/61  67  10/5 - 8:00 am - 149/70  64             2:15 pm - 130/57  72  Advised patient that these readings will be forwarded to MD for review as the readings have trending upward a bit.

## 2014-03-07 ENCOUNTER — Ambulatory Visit (INDEPENDENT_AMBULATORY_CARE_PROVIDER_SITE_OTHER): Payer: Self-pay | Admitting: Vascular Surgery

## 2014-03-07 ENCOUNTER — Encounter: Payer: Self-pay | Admitting: Vascular Surgery

## 2014-03-07 VITALS — BP 146/73 | HR 72 | Resp 16 | Ht 70.0 in | Wt 266.0 lb

## 2014-03-07 DIAGNOSIS — I6523 Occlusion and stenosis of bilateral carotid arteries: Secondary | ICD-10-CM

## 2014-03-07 NOTE — Progress Notes (Signed)
Subjective:     Patient ID: Caleb Wolf, male   DOB: 24-May-1941, 73 y.o.   MRN: [id number]  HPI-year-old male returns 3 weeks post left carotid endarterectomy with resection of redundant segment for a severe left ICA stenosis. He was also found to have a right ICA and CCA occlusion. He has been asymptomatic since surgery. He does take one aspirin per day. He denies lateralizing weakness, aphasia, amaurosis fugax, and hoarseness.   Review of Systems     Objective:   Physical Exam BP 146/73  Pulse 72  Resp 16  Ht 5\' 10"  (1.778 m)  Wt 266 lb (120.657 kg)  BMI 38.17 kg/m2  General well-developed well-nourished male no apparent stress alert and oriented x3 Left neck incision has mild swelling but no evidence of infection. 3+ carotid pulse palpable. Neurologic exam normal. Left leg with 1+ edema.     Assessment:     Doing well 3 weeks post left carotid endarterectomy with resection of redundant segment and primary reanastomosis with known right ICA occlusion-asymptomatic    Plan:     Return in 6 months with carotid duplex exam Will notify us if he develops any symptomatology We'll fit patient for short-leg elastic compression stockings 20-30 mm gradient

## 2014-03-08 ENCOUNTER — Other Ambulatory Visit: Payer: Self-pay | Admitting: *Deleted

## 2014-03-08 DIAGNOSIS — I739 Peripheral vascular disease, unspecified: Principal | ICD-10-CM

## 2014-03-08 DIAGNOSIS — I779 Disorder of arteries and arterioles, unspecified: Secondary | ICD-10-CM

## 2014-03-09 NOTE — Telephone Encounter (Signed)
Left message to return call 

## 2014-03-10 NOTE — Telephone Encounter (Signed)
Information below left on voice mail.

## 2014-03-13 ENCOUNTER — Encounter: Payer: Self-pay | Admitting: Cardiovascular Disease

## 2014-03-13 NOTE — Telephone Encounter (Signed)
Patient returned call & states that he just realized that the mg on his Clonidine bottle says 0.2mg  BID.  Please advise as all our notes say 0.1mg  BID.  Stated he could not remember if had a list or actual bottles with him during his first visit with [place].

## 2014-03-14 ENCOUNTER — Telehealth: Payer: Self-pay | Admitting: Pulmonary Disease

## 2014-03-14 DIAGNOSIS — G4733 Obstructive sleep apnea (adult) (pediatric): Secondary | ICD-10-CM

## 2014-03-14 MED ORDER — CLONIDINE HCL 0.3 MG PO TABS: 0.3000 mg | ORAL_TABLET | Freq: Every evening | ORAL | Status: DC

## 2014-03-14 MED ORDER — CLONIDINE HCL 0.2 MG PO TABS: 0.2000 mg | ORAL_TABLET | Freq: Every morning | ORAL | Status: DC

## 2014-03-14 NOTE — Telephone Encounter (Signed)
Patient notified.   Will send new 0.3mg  tablet to pharm [facility]) at patient request as the tablets are very small so he will not have to try to break them in half.  Asked patient to continue to monitor BP readings x 2 weeks & call back with update at that time.  Patient verbalized understanding.

## 2014-03-14 NOTE — Telephone Encounter (Signed)
Called spoke with pt. He reports he is needing order for new CPAP supplies and mask faxed to [facility]. I have done so. Nothing further needed

## 2014-03-14 NOTE — Telephone Encounter (Signed)
Increase evening dose to 0.3 mg.

## 2014-03-20 DIAGNOSIS — Z23 Encounter for immunization: Secondary | ICD-10-CM | POA: Diagnosis not present

## 2014-03-20 DIAGNOSIS — I10 Essential (primary) hypertension: Secondary | ICD-10-CM | POA: Diagnosis not present

## 2014-03-27 DIAGNOSIS — N401 Enlarged prostate with lower urinary tract symptoms: Secondary | ICD-10-CM | POA: Diagnosis not present

## 2014-03-27 DIAGNOSIS — N3941 Urge incontinence: Secondary | ICD-10-CM | POA: Diagnosis not present

## 2014-03-27 DIAGNOSIS — N528 Other male erectile dysfunction: Secondary | ICD-10-CM | POA: Diagnosis not present

## 2014-03-27 DIAGNOSIS — E291 Testicular hypofunction: Secondary | ICD-10-CM | POA: Diagnosis not present

## 2014-03-27 DIAGNOSIS — N400 Enlarged prostate without lower urinary tract symptoms: Secondary | ICD-10-CM | POA: Insufficient documentation

## 2014-03-30 ENCOUNTER — Telehealth: Payer: Self-pay | Admitting: Cardiovascular Disease

## 2014-03-30 NOTE — Telephone Encounter (Signed)
Mr. Abarca called in regards to his Clonidine States that he was told to call the office in one month

## 2014-04-04 NOTE — Telephone Encounter (Addendum)
BP readings looking better.   Stated he saw his PMD in the meantime also.  Dr. Willey Blade added Doxazosin 1mg  at bedtime.  Started approximately 2 weeks ago.    10/15 - 161/74  65      143/68         161/84 10/16 - 160/74  62      144  /78        149/70  69 10/23 - 149/63 74       135/73  64   145/66  74 10/24 - 131/58  80      113/53  94    125/64  83  10/26 - 137/66  59       133/66  61   135/60  67  10/27  -    137/61  69      113/58  63 10/28 - 132/65                136/61  77  10/29  144/74  64          148/70   66 10/30  135/63  74          142/66  67 10/31  140/61  61           142/61  74

## 2014-04-21 DIAGNOSIS — I10 Essential (primary) hypertension: Secondary | ICD-10-CM | POA: Diagnosis not present

## 2014-05-03 ENCOUNTER — Encounter: Payer: Self-pay | Admitting: Pulmonary Disease

## 2014-05-03 ENCOUNTER — Ambulatory Visit (INDEPENDENT_AMBULATORY_CARE_PROVIDER_SITE_OTHER): Payer: Medicare Other | Admitting: Pulmonary Disease

## 2014-05-03 VITALS — BP 160/62 | HR 1 | Temp 98.0°F | Ht 70.0 in | Wt 268.0 lb

## 2014-05-03 DIAGNOSIS — I6529 Occlusion and stenosis of unspecified carotid artery: Secondary | ICD-10-CM

## 2014-05-03 DIAGNOSIS — G4733 Obstructive sleep apnea (adult) (pediatric): Secondary | ICD-10-CM

## 2014-05-03 NOTE — Patient Instructions (Signed)
Continue on cpap. If you continue to have some mask leaking, let us know and we can arrange for a mask fitting session at the sleep center. Work on weight loss followup with me again in one year.

## 2014-05-03 NOTE — Progress Notes (Signed)
   Subjective:    Patient ID: Caleb Wolf, male    DOB: 08-07-1940, 73 y.o.   MRN: [id number]  HPI The patient comes in today for follow-up of his obstructive sleep apnea. He is wearing CPAP compliantly by his download, and overall is doing fairly well with this device. He does have occasional mask leak with breakthrough apneas, and I have suggested that he consider a different mask. Of note, his weight is stable from the last visit.   Review of Systems  Constitutional: Negative for fever and unexpected weight change.  HENT: Negative for congestion, dental problem, ear pain, nosebleeds, postnasal drip, rhinorrhea, sinus pressure, sneezing, sore throat and trouble swallowing.   Eyes: Negative for redness and itching.  Respiratory: Positive for shortness of breath. Negative for cough, chest tightness and wheezing.   Cardiovascular: Negative for palpitations and leg swelling.  Gastrointestinal: Negative for nausea and vomiting.  Genitourinary: Negative for dysuria.  Musculoskeletal: Negative for joint swelling.  Skin: Negative for rash.  Neurological: Negative for headaches.  Hematological: Does not bruise/bleed easily.  Psychiatric/Behavioral: Negative for dysphoric mood. The patient is not nervous/anxious.        Objective:   Physical Exam Overweight male in no acute distress Nose without purulence or discharge noted Neck without lymphadenopathy or thyromegaly No skin breakdown or pressure necrosis from the sleep apnea mask Lower extremities with minimal edema, no cyanosis Alert and oriented, moves all 4 extremities.       Assessment & Plan:

## 2014-05-03 NOTE — Assessment & Plan Note (Signed)
The patient is wearing his C Pap compliantly by his download, but is having some mask leak issues at times with associated breakthrough apnea. Overall, his AHI is well controlled. He feels that he is sleeping well with the device, but does have some sleepiness in the afternoon after he has been outside doing yard work. I have talked with him about his mask fit, and offered to send him to the sleep Center for a fitting session. If he continues to have issues, he will contact me about making this appointment.  I have encouraged him to work aggressively on weight loss.

## 2014-05-10 ENCOUNTER — Ambulatory Visit: Payer: Medicare Other | Admitting: Cardiovascular Disease

## 2014-05-12 ENCOUNTER — Encounter: Payer: Self-pay | Admitting: Cardiovascular Disease

## 2014-05-12 ENCOUNTER — Ambulatory Visit (INDEPENDENT_AMBULATORY_CARE_PROVIDER_SITE_OTHER): Payer: Medicare Other | Admitting: Cardiovascular Disease

## 2014-05-12 VITALS — BP 128/74 | HR 59 | Ht 69.0 in | Wt 262.0 lb

## 2014-05-12 DIAGNOSIS — Z9889 Other specified postprocedural states: Secondary | ICD-10-CM

## 2014-05-12 DIAGNOSIS — I5189 Other ill-defined heart diseases: Secondary | ICD-10-CM

## 2014-05-12 DIAGNOSIS — E785 Hyperlipidemia, unspecified: Secondary | ICD-10-CM

## 2014-05-12 DIAGNOSIS — I10 Essential (primary) hypertension: Secondary | ICD-10-CM

## 2014-05-12 DIAGNOSIS — R079 Chest pain, unspecified: Secondary | ICD-10-CM | POA: Diagnosis not present

## 2014-05-12 DIAGNOSIS — R001 Bradycardia, unspecified: Secondary | ICD-10-CM

## 2014-05-12 DIAGNOSIS — I6529 Occlusion and stenosis of unspecified carotid artery: Secondary | ICD-10-CM | POA: Diagnosis not present

## 2014-05-12 DIAGNOSIS — R42 Dizziness and giddiness: Secondary | ICD-10-CM

## 2014-05-12 DIAGNOSIS — I519 Heart disease, unspecified: Secondary | ICD-10-CM

## 2014-05-12 DIAGNOSIS — G4730 Sleep apnea, unspecified: Secondary | ICD-10-CM

## 2014-05-12 MED ORDER — ISOSORBIDE MONONITRATE ER 30 MG PO TB24: 30.0000 mg | ORAL_TABLET | Freq: Every day | ORAL | Status: DC

## 2014-05-12 MED ORDER — VERAPAMIL HCL 80 MG PO TABS: 80.0000 mg | ORAL_TABLET | Freq: Three times a day (TID) | ORAL | Status: DC

## 2014-05-12 NOTE — Progress Notes (Signed)
Patient ID: Caleb Wolf, male   DOB: 01-24-1941, 73 y.o.   MRN: [id number]      SUBJECTIVE: The patient presents for follow up after having undergone carotid endarterectomy. For the past 2 weeks he has been trying to perform outdoor activities such as leaf blowing on his driveway and has experienced chest pain on 5 occasions. On four of those occasions, the pain was relieved with one sublingual nitroglycerin tablet. On a fifth occasion, it required 2 sublingual nitroglycerin tablets to alleviate his chest discomfort. He has now become afraid to perform any significant strenuous activities. He said his blood pressure is typically high in the mornings but normalizes by the afternoon. He continues to try to lose weight and has achieved some degree of weight loss. He has also had occasional dizziness when getting out of the car at rest stops or if he stands too quickly. HR 59 bpm today. On 02/14/14, nuclear MPI study demonstrated normal myocardial perfusion and echocardiogram on that same day demonstrated normal left ventricular systolic function.  Review of Systems: As per "subjective", otherwise negative.  Allergies  Allergen Reactions  . Codeine Sulfate Nausea Only    Current Outpatient Prescriptions  Medication Sig Dispense Refill  . aspirin 81 MG tablet Take 81 mg by mouth daily.    . cloNIDine (CATAPRES) 0.3 MG tablet Take 1 tablet (0.3 mg total) by mouth every evening. (Patient taking differently: Take 0.3 mg by mouth 2 (two) times daily. ) 30 tablet 6  . doxazosin (CARDURA) 2 MG tablet Take 2 mg by mouth daily.    . finasteride (PROSCAR) 5 MG tablet Take 5 mg by mouth at bedtime.     . hydrochlorothiazide (HYDRODIURIL) 25 MG tablet Take 25 mg by mouth daily with breakfast.    . indomethacin (INDOCIN) 50 MG capsule Take 50 mg by mouth 2 (two) times daily as needed (for gout attack). Will take for 2-3 days during attack    . lisinopril (PRINIVIL,ZESTRIL) 40 MG tablet Take 40 mg by mouth at  bedtime.     . naproxen sodium (ALEVE) 220 MG tablet Take 220 mg by mouth as needed.     . nitroGLYCERIN (NITROSTAT) 0.4 MG SL tablet Place 1 tablet (0.4 mg total) under the tongue every 5 (five) minutes as needed for chest pain. 25 tablet 3  . oxyCODONE (ROXICODONE) 5 MG immediate release tablet Take 1 tablet (5 mg total) by mouth every 6 (six) hours as needed for severe pain. 20 tablet 0  . simvastatin (ZOCOR) 10 MG tablet Take 10 mg by mouth at bedtime.    . TESTIM 50 WU/9WJ GEL Apply 2 clicks as needed up three times weekly    . verapamil (CALAN-SR) 180 MG CR tablet Take 180 mg by mouth daily.      No current facility-administered medications for this visit.    Past Medical History  Diagnosis Date  . Coronary artery disease   . GERD (gastroesophageal reflux disease)   . Arthritis   . Hyperlipidemia     takes Simvastatin daily  . Carotid stenosis     07/04/11- 50-70%bilaterally- stable per Dr Willey Blade note  . Lower leg edema   . Lumbar disc disease   . Gout     takes Indocin daily as needed  . Enlarged prostate     takes Finasteride daily  . History of kidney stones   . PONV (postoperative nausea and vomiting)     also hard to urinate after surgery  .  Sleep apnea     uses CPAP  . Hypertension     takes Clonidine,Verapamil,HCTZ,and Lisinopril daily  . Joint pain   . Chronic back pain   . History of colon polyps   . Urinary urgency     Past Surgical History  Procedure Laterality Date  . Fracture surgery      bilateral wrist fractures- one ORIF  . Rhinoplasty    . Joint replacement  2011    left knee  . Eye surgery      cataract extraction with repair macular tear , with IOL     right  . Total hip arthroplasty  12/08/2011    Procedure: TOTAL HIP ARTHROPLASTY;  Surgeon: Gearlean Alf, MD;  Location: [facility];  Service: Orthopedics;  Laterality: Right;  . Tonsillectomy    . Wrist surgery Right 6yrs ago  . Lithotripsy    . Back surgery    . Wrist surgery Left   .  Cataract surgery Right   . Colonoscopy    . 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 Endarterectomy Left 02/17/2014    Procedure: ENDARTERECTOMY CAROTID WITH PATCH ANGIOPLASTY;  Surgeon: Mal Misty, MD;  Location: [facility];  Service: Vascular;  Laterality: Left;    History   Social History  . Marital Status: Married    Spouse Name: N/A    Number of Children: N/A  . Years of Education: N/A   Occupational History  . retired     [employer]   Social History Main Topics  . Smoking status: Former Smoker -- 2.00 packs/day for 25 years    Types: Cigarettes    Start date: 08/08/1960    Quit date: 06/02/1982  . Smokeless tobacco: Never Used     Comment: quit smoking 30+yrs ago  . Alcohol Use: 0.0 oz/week    0 Not specified per week     Comment: occasionally   . Drug Use: No  . Sexual Activity: Yes   Other Topics Concern  . Not on file   Social History Narrative     Filed Vitals:   05/12/14 1250  Height: 5\' 9"  (1.753 m)  Weight: 262 lb (118.842 kg)   BP 128/74  Pulse 59   PHYSICAL EXAM General: NAD HEENT: Normal. Neck: No JVD, no thyromegaly. Lungs: Clear to auscultation bilaterally with normal respiratory effort. CV: Nondisplaced PMI.  Regular rate and rhythm, normal S1/S2, no S3/S4, no murmur. No pretibial or periankle edema.  No carotid bruit.  Normal pedal pulses.  Abdomen: Soft, obese.  Neurologic: Alert and oriented x 3.  Psych: Normal affect. Skin: Normal. Musculoskeletal: Normal range of motion, no gross deformities. Extremities: No clubbing or cyanosis.   ECG: Most recent ECG reviewed.      ASSESSMENT AND PLAN: 1. Chest pain: He has been experiencing frequent episodes in the past few weeks, consistent with accelerating angina. I will initiate Imdur 30 mg daily. His cardiovascular risk factors include hypertension, hyperlipidemia, peripheral vascular disease and a family history of myocardial infarction. Continue aspirin 81 mg  and simvastatin 10 mg. I would not rule out the possibility of coronary angiography in the future and explained this to both the patient and his wife.  2. Essential HTN: Controlled today on present therapy which includes clonidine, hydrochlorothiazide, lisinopril and verapamil. Due to dizziness and mild bradycardia, I am reducing verapamil to 90 mg daily and adding Imdur for chest discomfort. This will need continued monitoring. 3. Hyperlipidemia:  Continue simvastatin 10 mg daily. He may require at least moderate intensity statin therapy.  4. Carotid artery stenosis: S/p left carotid endarterectomy on 02/17/14. Continue ASA and statin.  5. Sleep apnea: Uses CPAP.  6. Leg swelling/venous varicosities: Continue use of compression stockings. On HCTZ.  7. Dizziness: Due to dizziness and mild bradycardia, I am reducing verapamil to 90 mg daily.    Dispo: f/u 3-4 weeks.  Kate Sable, [name], F.A.C.C.

## 2014-05-12 NOTE — Patient Instructions (Signed)
Your physician recommends that you schedule a follow-up appointment in: 3-4 weeks   Your physician has recommended you make the following change in your medication:    DECREASE Verapamil to 80 mg daily   START Imdur 30 mg daily      Thank you for choosing [facility] !

## 2014-05-15 ENCOUNTER — Telehealth: Payer: Self-pay

## 2014-05-15 NOTE — Telephone Encounter (Signed)
Compared 2 home BP machines with manual check in office   Pts brown cuff Omron 165/67, gray cuff 156/77  Manual office 158/76   Pt noted CP this am upon arising but did not take NTG,sx's gone now, states he was running around this am and felt that to be the cause.advise to keep [place] informed if pain returns

## 2014-05-22 ENCOUNTER — Emergency Department (HOSPITAL_COMMUNITY): Payer: Medicare Other

## 2014-05-22 ENCOUNTER — Inpatient Hospital Stay (HOSPITAL_COMMUNITY): Payer: Medicare Other

## 2014-05-22 ENCOUNTER — Encounter (HOSPITAL_COMMUNITY): Payer: Self-pay | Admitting: *Deleted

## 2014-05-22 ENCOUNTER — Inpatient Hospital Stay (HOSPITAL_COMMUNITY): Payer: Medicare Other | Admitting: Anesthesiology

## 2014-05-22 ENCOUNTER — Encounter (HOSPITAL_COMMUNITY)
Admission: EM | Disposition: A | Discharge status: Home Health | Payer: Medicare Other | Source: Home / Self Care | Attending: Thoracic Surgery (Cardiothoracic Vascular Surgery)

## 2014-05-22 ENCOUNTER — Encounter (HOSPITAL_COMMUNITY)
Admission: EM | Disposition: A | Discharge status: Home Health | Payer: Self-pay | Source: Home / Self Care | Attending: Thoracic Surgery (Cardiothoracic Vascular Surgery)

## 2014-05-22 ENCOUNTER — Inpatient Hospital Stay (HOSPITAL_COMMUNITY)
Admission: EM | Admit: 2014-05-22 | Discharge: 2014-05-30 | DRG: 233 | Disposition: A | Discharge status: Home Health | Payer: Medicare Other | Attending: Thoracic Surgery (Cardiothoracic Vascular Surgery) | Admitting: Thoracic Surgery (Cardiothoracic Vascular Surgery)

## 2014-05-22 DIAGNOSIS — E785 Hyperlipidemia, unspecified: Secondary | ICD-10-CM | POA: Diagnosis present

## 2014-05-22 DIAGNOSIS — R9431 Abnormal electrocardiogram [ECG] [EKG]: Secondary | ICD-10-CM | POA: Diagnosis not present

## 2014-05-22 DIAGNOSIS — R338 Other retention of urine: Secondary | ICD-10-CM | POA: Diagnosis not present

## 2014-05-22 DIAGNOSIS — I6521 Occlusion and stenosis of right carotid artery: Secondary | ICD-10-CM | POA: Diagnosis present

## 2014-05-22 DIAGNOSIS — I2510 Atherosclerotic heart disease of native coronary artery without angina pectoris: Secondary | ICD-10-CM

## 2014-05-22 DIAGNOSIS — Z7982 Long term (current) use of aspirin: Secondary | ICD-10-CM | POA: Diagnosis not present

## 2014-05-22 DIAGNOSIS — I25110 Atherosclerotic heart disease of native coronary artery with unstable angina pectoris: Secondary | ICD-10-CM | POA: Diagnosis not present

## 2014-05-22 DIAGNOSIS — I25119 Atherosclerotic heart disease of native coronary artery with unspecified angina pectoris: Secondary | ICD-10-CM | POA: Diagnosis present

## 2014-05-22 DIAGNOSIS — Z951 Presence of aortocoronary bypass graft: Secondary | ICD-10-CM | POA: Diagnosis not present

## 2014-05-22 DIAGNOSIS — G4730 Sleep apnea, unspecified: Secondary | ICD-10-CM | POA: Diagnosis present

## 2014-05-22 DIAGNOSIS — I6529 Occlusion and stenosis of unspecified carotid artery: Secondary | ICD-10-CM | POA: Insufficient documentation

## 2014-05-22 DIAGNOSIS — Z8249 Family history of ischemic heart disease and other diseases of the circulatory system: Secondary | ICD-10-CM

## 2014-05-22 DIAGNOSIS — I214 Non-ST elevation (NSTEMI) myocardial infarction: Secondary | ICD-10-CM | POA: Insufficient documentation

## 2014-05-22 DIAGNOSIS — J9811 Atelectasis: Secondary | ICD-10-CM | POA: Diagnosis not present

## 2014-05-22 DIAGNOSIS — Z96652 Presence of left artificial knee joint: Secondary | ICD-10-CM | POA: Diagnosis present

## 2014-05-22 DIAGNOSIS — Z96641 Presence of right artificial hip joint: Secondary | ICD-10-CM | POA: Diagnosis present

## 2014-05-22 DIAGNOSIS — I200 Unstable angina: Secondary | ICD-10-CM

## 2014-05-22 DIAGNOSIS — I10 Essential (primary) hypertension: Secondary | ICD-10-CM | POA: Diagnosis present

## 2014-05-22 DIAGNOSIS — R079 Chest pain, unspecified: Secondary | ICD-10-CM | POA: Diagnosis present

## 2014-05-22 DIAGNOSIS — Z4682 Encounter for fitting and adjustment of non-vascular catheter: Secondary | ICD-10-CM | POA: Diagnosis not present

## 2014-05-22 DIAGNOSIS — N401 Enlarged prostate with lower urinary tract symptoms: Secondary | ICD-10-CM | POA: Diagnosis present

## 2014-05-22 DIAGNOSIS — G8929 Other chronic pain: Secondary | ICD-10-CM | POA: Diagnosis present

## 2014-05-22 DIAGNOSIS — I480 Paroxysmal atrial fibrillation: Secondary | ICD-10-CM | POA: Diagnosis not present

## 2014-05-22 DIAGNOSIS — K219 Gastro-esophageal reflux disease without esophagitis: Secondary | ICD-10-CM | POA: Diagnosis present

## 2014-05-22 DIAGNOSIS — M1990 Unspecified osteoarthritis, unspecified site: Secondary | ICD-10-CM | POA: Diagnosis not present

## 2014-05-22 DIAGNOSIS — M109 Gout, unspecified: Secondary | ICD-10-CM | POA: Diagnosis present

## 2014-05-22 DIAGNOSIS — K567 Ileus, unspecified: Secondary | ICD-10-CM | POA: Diagnosis not present

## 2014-05-22 DIAGNOSIS — I4891 Unspecified atrial fibrillation: Secondary | ICD-10-CM

## 2014-05-22 DIAGNOSIS — R0602 Shortness of breath: Secondary | ICD-10-CM | POA: Diagnosis not present

## 2014-05-22 DIAGNOSIS — Z981 Arthrodesis status: Secondary | ICD-10-CM | POA: Diagnosis not present

## 2014-05-22 DIAGNOSIS — R0789 Other chest pain: Secondary | ICD-10-CM | POA: Diagnosis not present

## 2014-05-22 DIAGNOSIS — Z87891 Personal history of nicotine dependence: Secondary | ICD-10-CM | POA: Diagnosis not present

## 2014-05-22 DIAGNOSIS — M549 Dorsalgia, unspecified: Secondary | ICD-10-CM | POA: Diagnosis present

## 2014-05-22 DIAGNOSIS — J90 Pleural effusion, not elsewhere classified: Secondary | ICD-10-CM | POA: Diagnosis not present

## 2014-05-22 DIAGNOSIS — I209 Angina pectoris, unspecified: Secondary | ICD-10-CM | POA: Diagnosis not present

## 2014-05-22 HISTORY — PX: CARDIAC CATHETERIZATION: SHX172

## 2014-05-22 HISTORY — PX: LEFT HEART CATHETERIZATION WITH CORONARY ANGIOGRAM: SHX5451

## 2014-05-22 HISTORY — PX: CORONARY ARTERY BYPASS GRAFT: SHX141

## 2014-05-22 LAB — CBC
HCT: 40.2 % (ref 39.0–52.0)
HCT: 39.1 % (ref 39.0–52.0)
HEMATOCRIT: 38.6 % — AB (ref 39.0–52.0)
HEMOGLOBIN: 13 g/dL (ref 13.0–17.0)
Hemoglobin: 12.7 g/dL — ABNORMAL LOW (ref 13.0–17.0)
Hemoglobin: 12.6 g/dL — ABNORMAL LOW (ref 13.0–17.0)
MCH: 29.8 pg (ref 26.0–34.0)
MCH: 30.5 pg (ref 26.0–34.0)
MCH: 29.9 pg (ref 26.0–34.0)
MCHC: 32.3 g/dL (ref 30.0–36.0)
MCHC: 32.9 g/dL (ref 30.0–36.0)
MCHC: 32.2 g/dL (ref 30.0–36.0)
MCV: 92.2 fL (ref 78.0–100.0)
MCV: 92.8 fL (ref 78.0–100.0)
MCV: 92.7 fL (ref 78.0–100.0)
PLATELETS: 254 10*3/uL (ref 150–400)
Platelets: 199 10*3/uL (ref 150–400)
Platelets: 151 10*3/uL (ref 150–400)
RBC: 4.36 MIL/uL (ref 4.22–5.81)
RBC: 4.16 MIL/uL — AB (ref 4.22–5.81)
RBC: 4.22 MIL/uL (ref 4.22–5.81)
RDW: 13.1 % (ref 11.5–15.5)
RDW: 13.2 % (ref 11.5–15.5)
RDW: 13.4 % (ref 11.5–15.5)
WBC: 15.8 10*3/uL — AB (ref 4.0–10.5)
WBC: 28.5 10*3/uL — ABNORMAL HIGH (ref 4.0–10.5)
WBC: 20.1 10*3/uL — AB (ref 4.0–10.5)

## 2014-05-22 LAB — POCT I-STAT 3, ART BLOOD GAS (G3+)
Acid-base deficit: 5 mmol/L — ABNORMAL HIGH (ref 0.0–2.0)
Acid-base deficit: 2 mmol/L (ref 0.0–2.0)
Acid-base deficit: 8 mmol/L — ABNORMAL HIGH (ref 0.0–2.0)
Bicarbonate: 21.5 mEq/L (ref 20.0–24.0)
Bicarbonate: 22.7 mEq/L (ref 20.0–24.0)
Bicarbonate: 19.8 mEq/L — ABNORMAL LOW (ref 20.0–24.0)
O2 SAT: 100 %
O2 SAT: 93 %
O2 Saturation: 100 %
PCO2 ART: 45.3 mmHg — AB (ref 35.0–45.0)
PH ART: 7.391 (ref 7.350–7.450)
PH ART: 7.243 — AB (ref 7.350–7.450)
PO2 ART: 245 mmHg — AB (ref 80.0–100.0)
TCO2: 23 mmol/L (ref 0–100)
TCO2: 24 mmol/L (ref 0–100)
TCO2: 21 mmol/L (ref 0–100)
pCO2 arterial: 45.1 mmHg — ABNORMAL HIGH (ref 35.0–45.0)
pCO2 arterial: 37.3 mmHg (ref 35.0–45.0)
pH, Arterial: 7.286 — ABNORMAL LOW (ref 7.350–7.450)
pO2, Arterial: 218 mmHg — ABNORMAL HIGH (ref 80.0–100.0)
pO2, Arterial: 76 mmHg — ABNORMAL LOW (ref 80.0–100.0)

## 2014-05-22 LAB — HEMOGLOBIN AND HEMATOCRIT, BLOOD
HCT: 28.8 % — ABNORMAL LOW (ref 39.0–52.0)
HEMOGLOBIN: 9.3 g/dL — AB (ref 13.0–17.0)

## 2014-05-22 LAB — POCT I-STAT, CHEM 8
BUN: 20 mg/dL (ref 6–23)
BUN: 19 mg/dL (ref 6–23)
BUN: 16 mg/dL (ref 6–23)
BUN: 17 mg/dL (ref 6–23)
BUN: 19 mg/dL (ref 6–23)
BUN: 17 mg/dL (ref 6–23)
CALCIUM ION: 1.15 mmol/L (ref 1.13–1.30)
CALCIUM ION: 1.15 mmol/L (ref 1.13–1.30)
CALCIUM ION: 1.02 mmol/L — AB (ref 1.13–1.30)
CHLORIDE: 101 meq/L (ref 96–112)
CREATININE: 1 mg/dL (ref 0.50–1.35)
Calcium, Ion: 0.99 mmol/L — ABNORMAL LOW (ref 1.13–1.30)
Calcium, Ion: 1.02 mmol/L — ABNORMAL LOW (ref 1.13–1.30)
Calcium, Ion: 1.17 mmol/L (ref 1.13–1.30)
Chloride: 106 mEq/L (ref 96–112)
Chloride: 105 mEq/L (ref 96–112)
Chloride: 101 mEq/L (ref 96–112)
Chloride: 104 mEq/L (ref 96–112)
Chloride: 104 mEq/L (ref 96–112)
Creatinine, Ser: 1.2 mg/dL (ref 0.50–1.35)
Creatinine, Ser: 1.1 mg/dL (ref 0.50–1.35)
Creatinine, Ser: 1.1 mg/dL (ref 0.50–1.35)
Creatinine, Ser: 1.1 mg/dL (ref 0.50–1.35)
Creatinine, Ser: 1.2 mg/dL (ref 0.50–1.35)
GLUCOSE: 149 mg/dL — AB (ref 70–99)
GLUCOSE: 197 mg/dL — AB (ref 70–99)
Glucose, Bld: 163 mg/dL — ABNORMAL HIGH (ref 70–99)
Glucose, Bld: 135 mg/dL — ABNORMAL HIGH (ref 70–99)
Glucose, Bld: 156 mg/dL — ABNORMAL HIGH (ref 70–99)
Glucose, Bld: 111 mg/dL — ABNORMAL HIGH (ref 70–99)
HCT: 38 % — ABNORMAL LOW (ref 39.0–52.0)
HCT: 29 % — ABNORMAL LOW (ref 39.0–52.0)
HCT: 23 % — ABNORMAL LOW (ref 39.0–52.0)
HCT: 29 % — ABNORMAL LOW (ref 39.0–52.0)
HCT: 39 % (ref 39.0–52.0)
HEMATOCRIT: 43 % (ref 39.0–52.0)
HEMOGLOBIN: 9.9 g/dL — AB (ref 13.0–17.0)
Hemoglobin: 14.6 g/dL (ref 13.0–17.0)
Hemoglobin: 12.9 g/dL — ABNORMAL LOW (ref 13.0–17.0)
Hemoglobin: 7.8 g/dL — ABNORMAL LOW (ref 13.0–17.0)
Hemoglobin: 9.9 g/dL — ABNORMAL LOW (ref 13.0–17.0)
Hemoglobin: 13.3 g/dL (ref 13.0–17.0)
POTASSIUM: 5.4 meq/L — AB (ref 3.7–5.3)
POTASSIUM: 5.5 meq/L — AB (ref 3.7–5.3)
Potassium: 4.2 mEq/L (ref 3.7–5.3)
Potassium: 4.4 mEq/L (ref 3.7–5.3)
Potassium: 4.3 mEq/L (ref 3.7–5.3)
Potassium: 3.9 mEq/L (ref 3.7–5.3)
SODIUM: 136 meq/L — AB (ref 137–147)
SODIUM: 134 meq/L — AB (ref 137–147)
SODIUM: 135 meq/L — AB (ref 137–147)
SODIUM: 142 meq/L (ref 137–147)
Sodium: 140 mEq/L (ref 137–147)
Sodium: 139 mEq/L (ref 137–147)
TCO2: 22 mmol/L (ref 0–100)
TCO2: 22 mmol/L (ref 0–100)
TCO2: 20 mmol/L (ref 0–100)
TCO2: 19 mmol/L (ref 0–100)
TCO2: 21 mmol/L (ref 0–100)
TCO2: 21 mmol/L (ref 0–100)

## 2014-05-22 LAB — BASIC METABOLIC PANEL
Anion gap: 17 — ABNORMAL HIGH (ref 5–15)
BUN: 22 mg/dL (ref 6–23)
CO2: 23 mEq/L (ref 19–32)
CREATININE: 1.24 mg/dL (ref 0.50–1.35)
Calcium: 9.2 mg/dL (ref 8.4–10.5)
Chloride: 104 mEq/L (ref 96–112)
GFR calc non Af Amer: 57 mL/min — ABNORMAL LOW (ref >90)
GFR, EST AFRICAN AMERICAN: 66 mL/min — AB (ref >90)
Glucose, Bld: 131 mg/dL — ABNORMAL HIGH (ref 70–99)
Potassium: 3.8 mEq/L (ref 3.7–5.3)
Sodium: 144 mEq/L (ref 137–147)

## 2014-05-22 LAB — CREATININE, SERUM
CREATININE: 1.12 mg/dL (ref 0.50–1.35)
GFR calc Af Amer: 74 mL/min — ABNORMAL LOW (ref >90)
GFR calc non Af Amer: 64 mL/min — ABNORMAL LOW (ref >90)

## 2014-05-22 LAB — CBC WITH DIFFERENTIAL/PLATELET
BASOS PCT: 0 % (ref 0–1)
Basophils Absolute: 0 10*3/uL (ref 0.0–0.1)
EOS ABS: 0.3 10*3/uL (ref 0.0–0.7)
Eosinophils Relative: 3 % (ref 0–5)
HEMATOCRIT: 41.8 % (ref 39.0–52.0)
Hemoglobin: 13.6 g/dL (ref 13.0–17.0)
Lymphocytes Relative: 21 % (ref 12–46)
Lymphs Abs: 2.1 10*3/uL (ref 0.7–4.0)
MCH: 30.2 pg (ref 26.0–34.0)
MCHC: 32.5 g/dL (ref 30.0–36.0)
MCV: 92.7 fL (ref 78.0–100.0)
MONOS PCT: 8 % (ref 3–12)
Monocytes Absolute: 0.8 10*3/uL (ref 0.1–1.0)
Neutro Abs: 6.8 10*3/uL (ref 1.7–7.7)
Neutrophils Relative %: 68 % (ref 43–77)
Platelets: 231 10*3/uL (ref 150–400)
RBC: 4.51 MIL/uL (ref 4.22–5.81)
RDW: 13.1 % (ref 11.5–15.5)
WBC: 10 10*3/uL (ref 4.0–10.5)

## 2014-05-22 LAB — PLATELET COUNT: PLATELETS: 193 10*3/uL (ref 150–400)

## 2014-05-22 LAB — TYPE AND SCREEN
ABO/RH(D): A POS
Antibody Screen: NEGATIVE

## 2014-05-22 LAB — COMPREHENSIVE METABOLIC PANEL
ALK PHOS: 89 U/L (ref 39–117)
ALT: 17 U/L (ref 0–53)
AST: 51 U/L — AB (ref 0–37)
Albumin: 3.6 g/dL (ref 3.5–5.2)
Anion gap: 15 (ref 5–15)
BUN: 20 mg/dL (ref 6–23)
CO2: 22 mEq/L (ref 19–32)
Calcium: 8.6 mg/dL (ref 8.4–10.5)
Chloride: 104 mEq/L (ref 96–112)
Creatinine, Ser: 1.11 mg/dL (ref 0.50–1.35)
GFR calc Af Amer: 75 mL/min — ABNORMAL LOW (ref >90)
GFR calc non Af Amer: 65 mL/min — ABNORMAL LOW (ref >90)
Glucose, Bld: 157 mg/dL — ABNORMAL HIGH (ref 70–99)
POTASSIUM: 4 meq/L (ref 3.7–5.3)
Sodium: 141 mEq/L (ref 137–147)
TOTAL PROTEIN: 6.4 g/dL (ref 6.0–8.3)
Total Bilirubin: <0.2 mg/dL — ABNORMAL LOW (ref 0.3–1.2)

## 2014-05-22 LAB — PROTIME-INR
INR: 0.92 (ref 0.00–1.49)
INR: 1.06 (ref 0.00–1.49)
INR: 1.26 (ref 0.00–1.49)
PROTHROMBIN TIME: 12.4 s (ref 11.6–15.2)
Prothrombin Time: 13.9 seconds (ref 11.6–15.2)
Prothrombin Time: 15.9 seconds — ABNORMAL HIGH (ref 11.6–15.2)

## 2014-05-22 LAB — CK TOTAL AND CKMB (NOT AT ARMC)
CK TOTAL: 890 U/L — AB (ref 7–232)
CK, MB: 132.7 ng/mL (ref 0.3–4.0)
RELATIVE INDEX: 14.9 — AB (ref 0.0–2.5)

## 2014-05-22 LAB — POCT I-STAT 4, (NA,K, GLUC, HGB,HCT)
GLUCOSE: 153 mg/dL — AB (ref 70–99)
HEMATOCRIT: 37 % — AB (ref 39.0–52.0)
Hemoglobin: 12.6 g/dL — ABNORMAL LOW (ref 13.0–17.0)
POTASSIUM: 4.3 meq/L (ref 3.7–5.3)
Sodium: 140 mEq/L (ref 137–147)

## 2014-05-22 LAB — LIPID PANEL
CHOL/HDL RATIO: 4 ratio
CHOLESTEROL: 194 mg/dL (ref 0–200)
HDL: 49 mg/dL (ref >39)
LDL Cholesterol: 128 mg/dL — ABNORMAL HIGH (ref 0–99)
Triglycerides: 87 mg/dL (ref <150)
VLDL: 17 mg/dL (ref 0–40)

## 2014-05-22 LAB — HEMOGLOBIN A1C
Hgb A1c MFr Bld: 6.1 % — ABNORMAL HIGH (ref <5.7)
MEAN PLASMA GLUCOSE: 128 mg/dL — AB (ref <117)

## 2014-05-22 LAB — APTT
APTT: 28 s (ref 24–37)
aPTT: 26 seconds (ref 24–37)
aPTT: 86 seconds — ABNORMAL HIGH (ref 24–37)

## 2014-05-22 LAB — TROPONIN I
Troponin I: 1.05 ng/mL (ref <0.30)
Troponin I: 7.76 ng/mL (ref <0.30)

## 2014-05-22 LAB — PHOSPHORUS: PHOSPHORUS: 2 mg/dL — AB (ref 2.3–4.6)

## 2014-05-22 LAB — MAGNESIUM
MAGNESIUM: 1.7 mg/dL (ref 1.5–2.5)
MAGNESIUM: 2.7 mg/dL — AB (ref 1.5–2.5)

## 2014-05-22 SURGERY — LEFT HEART CATHETERIZATION WITH CORONARY ANGIOGRAM

## 2014-05-22 SURGERY — CORONARY ARTERY BYPASS GRAFTING (CABG)
Anesthesia: General | Site: Chest

## 2014-05-22 MED ORDER — ATORVASTATIN CALCIUM 40 MG PO TABS
40.0000 mg | ORAL_TABLET | Freq: Every day | ORAL | Status: DC
Start: 2014-05-22 — End: 2014-05-30
  Administered 2014-05-23 – 2014-05-29 (×7): 40 mg via ORAL
  Filled 2014-05-22 (×10): qty 1

## 2014-05-22 MED ORDER — HEPARIN BOLUS VIA INFUSION: 5000.0000 [IU] | Freq: Once | INTRAVENOUS | Status: DC

## 2014-05-22 MED ORDER — SODIUM CHLORIDE 0.9 % IV SOLN
INTRAVENOUS | Status: DC
  Filled 2014-05-22: qty 2.5

## 2014-05-22 MED ORDER — VANCOMYCIN HCL 10 G IV SOLR
1250.0000 mg | INTRAVENOUS | Status: DC
  Filled 2014-05-22 (×2): qty 1250

## 2014-05-22 MED ORDER — METOPROLOL TARTRATE 25 MG/10 ML ORAL SUSPENSION
12.5000 mg | Freq: Two times a day (BID) | ORAL | Status: DC
  Filled 2014-05-22 (×3): qty 5

## 2014-05-22 MED ORDER — OXYCODONE HCL 5 MG PO TABS
5.0000 mg | ORAL_TABLET | ORAL | Status: DC | PRN
  Administered 2014-05-23 – 2014-05-27 (×12): 10 mg via ORAL
  Filled 2014-05-22 (×3): qty 2
  Filled 2014-05-22: qty 1
  Filled 2014-05-22 (×9): qty 2

## 2014-05-22 MED ORDER — PHENYLEPHRINE HCL 10 MG/ML IJ SOLN
0.0000 ug/min | INTRAVENOUS | Status: DC
  Filled 2014-05-22: qty 2

## 2014-05-22 MED ORDER — AMINOCAPROIC ACID 250 MG/ML IV SOLN
INTRAVENOUS | Status: DC
  Filled 2014-05-22: qty 40

## 2014-05-22 MED ORDER — PROTAMINE SULFATE 10 MG/ML IV SOLN
INTRAVENOUS | Status: AC
  Filled 2014-05-22: qty 10

## 2014-05-22 MED ORDER — NITROGLYCERIN IN D5W 200-5 MCG/ML-% IV SOLN
2.0000 ug/min | INTRAVENOUS | Status: DC
  Filled 2014-05-22: qty 250

## 2014-05-22 MED ORDER — LACTATED RINGERS IV SOLN
INTRAVENOUS | Status: DC | PRN
  Administered 2014-05-22: 10:00:00 via INTRAVENOUS

## 2014-05-22 MED ORDER — SODIUM CHLORIDE 0.9 % IV SOLN
INTRAVENOUS | Status: DC
  Filled 2014-05-22: qty 40

## 2014-05-22 MED ORDER — MORPHINE SULFATE 2 MG/ML IJ SOLN
2.0000 mg | INTRAMUSCULAR | Status: DC | PRN
  Administered 2014-05-22: 4 mg via INTRAVENOUS
  Administered 2014-05-22: 2 mg via INTRAVENOUS
  Administered 2014-05-23 (×9): 4 mg via INTRAVENOUS
  Administered 2014-05-24 – 2014-05-25 (×2): 2 mg via INTRAVENOUS
  Filled 2014-05-22 (×5): qty 2
  Filled 2014-05-22 (×2): qty 1
  Filled 2014-05-22: qty 2
  Filled 2014-05-22: qty 1
  Filled 2014-05-22 (×4): qty 2

## 2014-05-22 MED ORDER — ONDANSETRON HCL 4 MG/2ML IJ SOLN
4.0000 mg | Freq: Four times a day (QID) | INTRAMUSCULAR | Status: DC | PRN
  Administered 2014-05-23 – 2014-05-24 (×4): 4 mg via INTRAVENOUS
  Filled 2014-05-22 (×4): qty 2

## 2014-05-22 MED ORDER — DILTIAZEM LOAD VIA INFUSION
25.0000 mg | Freq: Once | INTRAVENOUS | Status: AC
  Administered 2014-05-22: 25 mg via INTRAVENOUS
  Filled 2014-05-22: qty 25

## 2014-05-22 MED ORDER — FINASTERIDE 5 MG PO TABS
5.0000 mg | ORAL_TABLET | Freq: Every day | ORAL | Status: DC
  Administered 2014-05-23 – 2014-05-29 (×7): 5 mg via ORAL
  Filled 2014-05-22 (×12): qty 1

## 2014-05-22 MED ORDER — POTASSIUM CHLORIDE 2 MEQ/ML IV SOLN
80.0000 meq | INTRAVENOUS | Status: DC
  Filled 2014-05-22: qty 40

## 2014-05-22 MED ORDER — FAMOTIDINE IN NACL 20-0.9 MG/50ML-% IV SOLN
20.0000 mg | Freq: Two times a day (BID) | INTRAVENOUS | Status: DC
  Administered 2014-05-22: 20 mg via INTRAVENOUS

## 2014-05-22 MED ORDER — VANCOMYCIN HCL IN DEXTROSE 1-5 GM/200ML-% IV SOLN
1000.0000 mg | Freq: Once | INTRAVENOUS | Status: AC
  Administered 2014-05-23: 1000 mg via INTRAVENOUS
  Filled 2014-05-22 (×2): qty 200

## 2014-05-22 MED ORDER — FAMOTIDINE IN NACL 20-0.9 MG/50ML-% IV SOLN: 20.0000 mg | Freq: Two times a day (BID) | INTRAVENOUS | Status: DC

## 2014-05-22 MED ORDER — LACTATED RINGERS IV SOLN
INTRAVENOUS | Status: DC
  Administered 2014-05-22: 20 mL/h via INTRAVENOUS
  Administered 2014-05-23: 19:00:00 via INTRAVENOUS

## 2014-05-22 MED ORDER — MUPIROCIN 2 % EX OINT
1.0000 "application " | TOPICAL_OINTMENT | Freq: Two times a day (BID) | CUTANEOUS | Status: AC
  Administered 2014-05-22 – 2014-05-27 (×10): 1 via NASAL
  Filled 2014-05-22 (×2): qty 22

## 2014-05-22 MED ORDER — DOCUSATE SODIUM 100 MG PO CAPS
200.0000 mg | ORAL_CAPSULE | Freq: Every day | ORAL | Status: DC
  Administered 2014-05-24 – 2014-05-28 (×5): 200 mg via ORAL
  Filled 2014-05-22 (×7): qty 2

## 2014-05-22 MED ORDER — MIDAZOLAM HCL 10 MG/2ML IJ SOLN
INTRAMUSCULAR | Status: AC
  Filled 2014-05-22: qty 2

## 2014-05-22 MED ORDER — DOPAMINE-DEXTROSE 3.2-5 MG/ML-% IV SOLN: 5.0000 ug/kg/min | INTRAVENOUS | Status: DC

## 2014-05-22 MED ORDER — LACTATED RINGERS IV SOLN: 500.0000 mL | Freq: Once | INTRAVENOUS | Status: AC | PRN

## 2014-05-22 MED ORDER — DEXMEDETOMIDINE HCL IN NACL 200 MCG/50ML IV SOLN
0.0000 ug/kg/h | INTRAVENOUS | Status: DC
  Administered 2014-05-22 (×2): 0.5 ug/kg/h via INTRAVENOUS
  Administered 2014-05-23: 0.3 ug/kg/h via INTRAVENOUS
  Filled 2014-05-22 (×3): qty 50

## 2014-05-22 MED ORDER — ROCURONIUM BROMIDE 100 MG/10ML IV SOLN
INTRAVENOUS | Status: DC | PRN
  Administered 2014-05-22 (×4): 50 mg via INTRAVENOUS

## 2014-05-22 MED ORDER — SODIUM CHLORIDE 0.9 % IV BOLUS (SEPSIS)
1000.0000 mL | Freq: Once | INTRAVENOUS | Status: AC
  Administered 2014-05-22: 1000 mL via INTRAVENOUS

## 2014-05-22 MED ORDER — AMIODARONE HCL IN DEXTROSE 360-4.14 MG/200ML-% IV SOLN
60.0000 mg/h | INTRAVENOUS | Status: AC
  Administered 2014-05-22 (×2): 60 mg/h via INTRAVENOUS
  Filled 2014-05-22 (×2): qty 200

## 2014-05-22 MED ORDER — BISACODYL 5 MG PO TBEC
10.0000 mg | DELAYED_RELEASE_TABLET | Freq: Every day | ORAL | Status: DC
  Administered 2014-05-24 – 2014-05-28 (×5): 10 mg via ORAL
  Filled 2014-05-22 (×5): qty 2

## 2014-05-22 MED ORDER — HEPARIN SODIUM (PORCINE) 5000 UNIT/ML IJ SOLN
INTRAMUSCULAR | Status: AC
  Filled 2014-05-22: qty 1

## 2014-05-22 MED ORDER — HEPARIN (PORCINE) IN NACL 100-0.45 UNIT/ML-% IJ SOLN
1500.0000 [IU]/h | INTRAMUSCULAR | Status: DC
  Administered 2014-05-22: 1500 [IU]/h via INTRAVENOUS
  Filled 2014-05-22 (×2): qty 250

## 2014-05-22 MED ORDER — CEFUROXIME SODIUM 1.5 G IJ SOLR
1.5000 g | INTRAMUSCULAR | Status: DC
  Filled 2014-05-22 (×2): qty 1.5

## 2014-05-22 MED ORDER — POTASSIUM CHLORIDE 2 MEQ/ML IV SOLN
80.0000 meq | INTRAVENOUS | Status: DC
Start: 2014-05-22 — End: 2014-05-22
  Filled 2014-05-22: qty 40

## 2014-05-22 MED ORDER — SODIUM BICARBONATE 8.4 % IV SOLN
50.0000 meq | Freq: Once | INTRAVENOUS | Status: AC
  Administered 2014-05-22: 50 meq via INTRAVENOUS

## 2014-05-22 MED ORDER — 0.9 % SODIUM CHLORIDE (POUR BTL) OPTIME
TOPICAL | Status: DC | PRN
  Administered 2014-05-22: 1000 mL

## 2014-05-22 MED ORDER — SUCCINYLCHOLINE CHLORIDE 20 MG/ML IJ SOLN
INTRAMUSCULAR | Status: DC | PRN
  Administered 2014-05-22: 160 mg via INTRAVENOUS

## 2014-05-22 MED ORDER — DEXTROSE 5 % IV SOLN
750.0000 mg | INTRAVENOUS | Status: DC
  Filled 2014-05-22: qty 750

## 2014-05-22 MED ORDER — VANCOMYCIN HCL 1000 MG IV SOLR
1000.0000 mg | INTRAVENOUS | Status: DC | PRN
  Administered 2014-05-22: 1250 mg via INTRAVENOUS

## 2014-05-22 MED ORDER — LIDOCAINE HCL (CARDIAC) 20 MG/ML IV SOLN
INTRAVENOUS | Status: DC | PRN
  Administered 2014-05-22: 50 mg via INTRAVENOUS

## 2014-05-22 MED ORDER — SODIUM CHLORIDE 0.9 % IJ SOLN: 3.0000 mL | INTRAMUSCULAR | Status: DC | PRN

## 2014-05-22 MED ORDER — LACTATED RINGERS IV SOLN
INTRAVENOUS | Status: DC | PRN
Start: 2014-05-22 — End: 2014-05-22
  Administered 2014-05-22 (×2): via INTRAVENOUS

## 2014-05-22 MED ORDER — PROTAMINE SULFATE 10 MG/ML IV SOLN
INTRAVENOUS | Status: AC
  Filled 2014-05-22: qty 25

## 2014-05-22 MED ORDER — LIDOCAINE HCL (PF) 1 % IJ SOLN
INTRAMUSCULAR | Status: AC
  Filled 2014-05-22: qty 60

## 2014-05-22 MED ORDER — SODIUM CHLORIDE 0.9 % IV SOLN: 250.0000 mL | INTRAVENOUS | Status: DC

## 2014-05-22 MED ORDER — SODIUM CHLORIDE 0.45 % IV SOLN
INTRAVENOUS | Status: DC
  Administered 2014-05-22: 20 mL/h via INTRAVENOUS

## 2014-05-22 MED ORDER — SODIUM CHLORIDE 0.9 % IV SOLN
INTRAVENOUS | Status: DC
  Filled 2014-05-22: qty 30

## 2014-05-22 MED ORDER — MORPHINE SULFATE 2 MG/ML IJ SOLN
1.0000 mg | INTRAMUSCULAR | Status: AC | PRN
  Administered 2014-05-22: 4 mg via INTRAVENOUS
  Filled 2014-05-22: qty 2

## 2014-05-22 MED ORDER — NITROGLYCERIN 1 MG/10 ML FOR IR/CATH LAB
INTRA_ARTERIAL | Status: AC
  Filled 2014-05-22: qty 10

## 2014-05-22 MED ORDER — PROPOFOL 10 MG/ML IV BOLUS
INTRAVENOUS | Status: DC | PRN
  Administered 2014-05-22: 30 mg via INTRAVENOUS

## 2014-05-22 MED ORDER — AMIODARONE HCL IN DEXTROSE 360-4.14 MG/200ML-% IV SOLN: 30.0000 mg/h | INTRAVENOUS | Status: DC

## 2014-05-22 MED ORDER — LIDOCAINE HCL (CARDIAC) 20 MG/ML IV SOLN
INTRAVENOUS | Status: AC
  Filled 2014-05-22: qty 5

## 2014-05-22 MED ORDER — AMIODARONE HCL IN DEXTROSE 360-4.14 MG/200ML-% IV SOLN
INTRAVENOUS | Status: AC
  Administered 2014-05-22: 200 mL
  Filled 2014-05-22: qty 200

## 2014-05-22 MED ORDER — ASPIRIN EC 325 MG PO TBEC
325.0000 mg | DELAYED_RELEASE_TABLET | Freq: Every day | ORAL | Status: DC
Start: 2014-05-23 — End: 2014-05-25
  Administered 2014-05-23 – 2014-05-24 (×2): 325 mg via ORAL
  Filled 2014-05-22 (×3): qty 1

## 2014-05-22 MED ORDER — ROCURONIUM BROMIDE 50 MG/5ML IV SOLN
INTRAVENOUS | Status: AC
  Filled 2014-05-22: qty 1

## 2014-05-22 MED ORDER — DOPAMINE-DEXTROSE 3.2-5 MG/ML-% IV SOLN
INTRAVENOUS | Status: DC | PRN
  Administered 2014-05-22: 7 ug/kg/min via INTRAVENOUS

## 2014-05-22 MED ORDER — MIDAZOLAM HCL 5 MG/5ML IJ SOLN
INTRAMUSCULAR | Status: DC | PRN
  Administered 2014-05-22: 2 mg via INTRAVENOUS
  Administered 2014-05-22: 3 mg via INTRAVENOUS
  Administered 2014-05-22 (×3): 5 mg via INTRAVENOUS

## 2014-05-22 MED ORDER — MAGNESIUM SULFATE 4 GM/100ML IV SOLN
4.0000 g | Freq: Once | INTRAVENOUS | Status: AC
  Administered 2014-05-22: 4 g via INTRAVENOUS
  Filled 2014-05-22: qty 100

## 2014-05-22 MED ORDER — FENTANYL CITRATE 0.05 MG/ML IJ SOLN
INTRAMUSCULAR | Status: AC
  Filled 2014-05-22: qty 5

## 2014-05-22 MED ORDER — INSULIN REGULAR BOLUS VIA INFUSION
0.0000 [IU] | Freq: Three times a day (TID) | INTRAVENOUS | Status: DC
  Filled 2014-05-22: qty 10

## 2014-05-22 MED ORDER — TRAMADOL HCL 50 MG PO TABS
50.0000 mg | ORAL_TABLET | ORAL | Status: DC | PRN
  Administered 2014-05-24: 100 mg via ORAL
  Administered 2014-05-25: 50 mg via ORAL
  Administered 2014-05-28 (×2): 100 mg via ORAL
  Filled 2014-05-22: qty 2
  Filled 2014-05-22: qty 1
  Filled 2014-05-22 (×2): qty 2

## 2014-05-22 MED ORDER — NITROGLYCERIN IN D5W 200-5 MCG/ML-% IV SOLN
INTRAVENOUS | Status: DC | PRN
  Administered 2014-05-22: 10 ug/min via INTRAVENOUS

## 2014-05-22 MED ORDER — HEPARIN (PORCINE) IN NACL 100-0.45 UNIT/ML-% IJ SOLN: 16.0000 [IU]/kg/h | INTRAMUSCULAR | Status: DC

## 2014-05-22 MED ORDER — MIDAZOLAM HCL 2 MG/2ML IJ SOLN
2.0000 mg | INTRAMUSCULAR | Status: DC | PRN
  Administered 2014-05-22: 2 mg via INTRAVENOUS
  Filled 2014-05-22: qty 2

## 2014-05-22 MED ORDER — SODIUM CHLORIDE 0.9 % IV SOLN: INTRAVENOUS | Status: DC

## 2014-05-22 MED ORDER — METOPROLOL TARTRATE 1 MG/ML IV SOLN
2.5000 mg | INTRAVENOUS | Status: DC | PRN
  Administered 2014-05-23 – 2014-05-24 (×4): 5 mg via INTRAVENOUS
  Filled 2014-05-22 (×3): qty 5

## 2014-05-22 MED ORDER — PANTOPRAZOLE SODIUM 40 MG PO TBEC
40.0000 mg | DELAYED_RELEASE_TABLET | Freq: Every day | ORAL | Status: DC
  Administered 2014-05-24 – 2014-05-30 (×7): 40 mg via ORAL
  Filled 2014-05-22 (×8): qty 1

## 2014-05-22 MED ORDER — NITROGLYCERIN 0.4 MG SL SUBL: 0.4000 mg | SUBLINGUAL_TABLET | SUBLINGUAL | Status: DC | PRN

## 2014-05-22 MED ORDER — FENTANYL CITRATE 0.05 MG/ML IJ SOLN
INTRAMUSCULAR | Status: AC
  Filled 2014-05-22: qty 2

## 2014-05-22 MED ORDER — ALBUMIN HUMAN 5 % IV SOLN
250.0000 mL | INTRAVENOUS | Status: AC | PRN
  Administered 2014-05-22 (×2): 250 mL via INTRAVENOUS

## 2014-05-22 MED ORDER — NITROGLYCERIN IN D5W 200-5 MCG/ML-% IV SOLN
2.0000 ug/min | INTRAVENOUS | Status: DC
  Administered 2014-05-22: 10 ug/min via INTRAVENOUS
  Filled 2014-05-22: qty 250

## 2014-05-22 MED ORDER — DOPAMINE-DEXTROSE 3.2-5 MG/ML-% IV SOLN
0.0000 ug/kg/min | INTRAVENOUS | Status: DC
  Filled 2014-05-22: qty 250

## 2014-05-22 MED ORDER — ACETAMINOPHEN 500 MG PO TABS
1000.0000 mg | ORAL_TABLET | Freq: Four times a day (QID) | ORAL | Status: AC
  Administered 2014-05-23 – 2014-05-27 (×15): 1000 mg via ORAL
  Filled 2014-05-22 (×19): qty 2

## 2014-05-22 MED ORDER — HEPARIN SODIUM (PORCINE) 1000 UNIT/ML IJ SOLN
INTRAMUSCULAR | Status: DC | PRN
  Administered 2014-05-22: 38 mL via INTRAVENOUS
  Administered 2014-05-22: 2 mL via INTRAVENOUS

## 2014-05-22 MED ORDER — EPINEPHRINE HCL 1 MG/ML IJ SOLN
0.0000 ug/min | INTRAVENOUS | Status: DC
  Filled 2014-05-22: qty 4

## 2014-05-22 MED ORDER — HEMOSTATIC AGENTS (NO CHARGE) OPTIME
TOPICAL | Status: DC | PRN
  Administered 2014-05-22: 1 via TOPICAL

## 2014-05-22 MED ORDER — CHLORHEXIDINE GLUCONATE CLOTH 2 % EX PADS
6.0000 | MEDICATED_PAD | Freq: Every day | CUTANEOUS | Status: DC
  Administered 2014-05-22 – 2014-05-25 (×4): 6 via TOPICAL

## 2014-05-22 MED ORDER — ROCURONIUM BROMIDE 50 MG/5ML IV SOLN
INTRAVENOUS | Status: AC
  Filled 2014-05-22: qty 4

## 2014-05-22 MED ORDER — SODIUM CHLORIDE 0.9 % IJ SOLN
INTRAMUSCULAR | Status: AC
  Filled 2014-05-22: qty 10

## 2014-05-22 MED ORDER — DOPAMINE-DEXTROSE 3.2-5 MG/ML-% IV SOLN
0.0000 ug/kg/min | INTRAVENOUS | Status: DC
Start: 2014-05-22 — End: 2014-05-22

## 2014-05-22 MED ORDER — HEPARIN (PORCINE) IN NACL 2-0.9 UNIT/ML-% IJ SOLN
INTRAMUSCULAR | Status: AC
  Filled 2014-05-22: qty 1000

## 2014-05-22 MED ORDER — FENTANYL CITRATE 0.05 MG/ML IJ SOLN
INTRAMUSCULAR | Status: DC | PRN
  Administered 2014-05-22 (×3): 250 ug via INTRAVENOUS
  Administered 2014-05-22: 100 ug via INTRAVENOUS
  Administered 2014-05-22: 650 ug via INTRAVENOUS

## 2014-05-22 MED ORDER — SODIUM CHLORIDE 0.9 % IV SOLN
200.0000 ug | INTRAVENOUS | Status: DC | PRN
  Administered 2014-05-22: 0.3 ug/kg/h via INTRAVENOUS

## 2014-05-22 MED ORDER — ACETAMINOPHEN 160 MG/5ML PO SOLN: 650.0000 mg | Freq: Once | ORAL | Status: AC

## 2014-05-22 MED ORDER — ASPIRIN 81 MG PO CHEW: 324.0000 mg | CHEWABLE_TABLET | Freq: Every day | ORAL | Status: DC

## 2014-05-22 MED ORDER — ARTIFICIAL TEARS OP OINT
TOPICAL_OINTMENT | OPHTHALMIC | Status: AC
  Filled 2014-05-22: qty 3.5

## 2014-05-22 MED ORDER — MAGNESIUM SULFATE 50 % IJ SOLN
40.0000 meq | INTRAMUSCULAR | Status: DC
  Filled 2014-05-22: qty 10

## 2014-05-22 MED ORDER — POTASSIUM CHLORIDE 10 MEQ/50ML IV SOLN: 10.0000 meq | INTRAVENOUS | Status: AC

## 2014-05-22 MED ORDER — SODIUM CHLORIDE 0.9 % IJ SOLN
3.0000 mL | Freq: Two times a day (BID) | INTRAMUSCULAR | Status: DC
  Administered 2014-05-23 – 2014-05-25 (×5): 3 mL via INTRAVENOUS

## 2014-05-22 MED ORDER — ACETAMINOPHEN 160 MG/5ML PO SOLN
1000.0000 mg | Freq: Four times a day (QID) | ORAL | Status: AC
  Filled 2014-05-22: qty 40

## 2014-05-22 MED ORDER — MORPHINE SULFATE 4 MG/ML IJ SOLN
4.0000 mg | Freq: Once | INTRAMUSCULAR | Status: AC
  Administered 2014-05-22: 4 mg via INTRAVENOUS
  Filled 2014-05-22: qty 1

## 2014-05-22 MED ORDER — PHENYLEPHRINE HCL 10 MG/ML IJ SOLN
30.0000 ug/min | INTRAVENOUS | Status: DC
  Filled 2014-05-22: qty 2

## 2014-05-22 MED ORDER — AMIODARONE LOAD VIA INFUSION
300.0000 mg | Freq: Once | INTRAVENOUS | Status: DC
  Filled 2014-05-22: qty 166.67

## 2014-05-22 MED ORDER — HEPARIN SODIUM (PORCINE) 1000 UNIT/ML IJ SOLN
INTRAMUSCULAR | Status: AC
  Filled 2014-05-22: qty 1

## 2014-05-22 MED ORDER — ACETAMINOPHEN 650 MG RE SUPP
650.0000 mg | Freq: Once | RECTAL | Status: AC
  Administered 2014-05-22: 650 mg via RECTAL

## 2014-05-22 MED ORDER — DEXTROSE 5 % IV SOLN
1.5000 g | Freq: Two times a day (BID) | INTRAVENOUS | Status: AC
  Administered 2014-05-22 – 2014-05-24 (×4): 1.5 g via INTRAVENOUS
  Filled 2014-05-22 (×4): qty 1.5

## 2014-05-22 MED ORDER — DEXTROSE 5 % IV SOLN
1.5000 g | INTRAVENOUS | Status: DC | PRN
  Administered 2014-05-22: 1.5 g via INTRAVENOUS

## 2014-05-22 MED ORDER — DEXTROSE 5 % IV SOLN
10.0000 mg | INTRAVENOUS | Status: DC | PRN
  Administered 2014-05-22: 40 ug/min via INTRAVENOUS

## 2014-05-22 MED ORDER — DEXMEDETOMIDINE HCL IN NACL 400 MCG/100ML IV SOLN
0.1000 ug/kg/h | INTRAVENOUS | Status: DC
  Filled 2014-05-22: qty 100

## 2014-05-22 MED ORDER — HEPARIN SODIUM (PORCINE) 1000 UNIT/ML IJ SOLN
INTRAMUSCULAR | Status: DC
  Filled 2014-05-22: qty 30

## 2014-05-22 MED ORDER — CLONIDINE HCL 0.3 MG PO TABS
0.3000 mg | ORAL_TABLET | Freq: Two times a day (BID) | ORAL | Status: DC
  Filled 2014-05-22: qty 1

## 2014-05-22 MED ORDER — METOPROLOL TARTRATE 12.5 MG HALF TABLET
12.5000 mg | ORAL_TABLET | Freq: Two times a day (BID) | ORAL | Status: DC
  Filled 2014-05-22 (×3): qty 1

## 2014-05-22 MED ORDER — VERAPAMIL HCL 2.5 MG/ML IV SOLN
INTRAVENOUS | Status: AC
  Filled 2014-05-22: qty 2

## 2014-05-22 MED ORDER — PHENYLEPHRINE 40 MCG/ML (10ML) SYRINGE FOR IV PUSH (FOR BLOOD PRESSURE SUPPORT)
PREFILLED_SYRINGE | INTRAVENOUS | Status: AC
  Filled 2014-05-22: qty 10

## 2014-05-22 MED ORDER — SODIUM CHLORIDE 0.9 % IV SOLN
10.0000 g | INTRAVENOUS | Status: DC | PRN
  Administered 2014-05-22: 5 g/h via INTRAVENOUS

## 2014-05-22 MED ORDER — ONDANSETRON HCL 4 MG/2ML IJ SOLN: 4.0000 mg | Freq: Four times a day (QID) | INTRAMUSCULAR | Status: DC | PRN

## 2014-05-22 MED ORDER — PROPOFOL 10 MG/ML IV BOLUS
INTRAVENOUS | Status: AC
  Filled 2014-05-22: qty 20

## 2014-05-22 MED ORDER — EPHEDRINE SULFATE 50 MG/ML IJ SOLN
INTRAMUSCULAR | Status: AC
  Filled 2014-05-22: qty 1

## 2014-05-22 MED ORDER — ASPIRIN EC 81 MG PO TBEC: 81.0000 mg | DELAYED_RELEASE_TABLET | Freq: Every day | ORAL | Status: DC

## 2014-05-22 MED ORDER — ACETAMINOPHEN 325 MG PO TABS: 650.0000 mg | ORAL_TABLET | ORAL | Status: DC | PRN

## 2014-05-22 MED ORDER — AMIODARONE LOAD VIA INFUSION
300.0000 mg | Freq: Once | INTRAVENOUS | Status: AC
  Administered 2014-05-22: 300 mg via INTRAVENOUS

## 2014-05-22 MED ORDER — AMIODARONE HCL IN DEXTROSE 360-4.14 MG/200ML-% IV SOLN
INTRAVENOUS | Status: AC
  Administered 2014-05-22: 59.4 mg/h via INTRAVENOUS
  Filled 2014-05-22: qty 200

## 2014-05-22 MED ORDER — PROTAMINE SULFATE 10 MG/ML IV SOLN
INTRAVENOUS | Status: DC | PRN
  Administered 2014-05-22: 150 mg via INTRAVENOUS
  Administered 2014-05-22: 95 mg via INTRAVENOUS
  Administered 2014-05-22: 100 mg via INTRAVENOUS
  Administered 2014-05-22: 5 mg via INTRAVENOUS

## 2014-05-22 MED ORDER — PLASMA-LYTE 148 IV SOLN
INTRAVENOUS | Status: DC
  Filled 2014-05-22: qty 2.5

## 2014-05-22 MED ORDER — CEFUROXIME SODIUM 750 MG IJ SOLR
750.0000 mg | INTRAMUSCULAR | Status: DC
  Filled 2014-05-22: qty 750

## 2014-05-22 MED ORDER — SODIUM CHLORIDE 0.9 % IV SOLN
250.0000 [IU] | INTRAVENOUS | Status: DC | PRN
  Administered 2014-05-22: 3.1 [IU]/h via INTRAVENOUS

## 2014-05-22 MED ORDER — HEPARIN BOLUS VIA INFUSION
4000.0000 [IU] | Freq: Once | INTRAVENOUS | Status: AC
  Administered 2014-05-22: 4000 [IU] via INTRAVENOUS
  Filled 2014-05-22: qty 4000

## 2014-05-22 MED ORDER — NITROGLYCERIN IN D5W 200-5 MCG/ML-% IV SOLN: 0.0000 ug/min | INTRAVENOUS | Status: DC

## 2014-05-22 MED ORDER — BISACODYL 10 MG RE SUPP: 10.0000 mg | Freq: Every day | RECTAL | Status: DC

## 2014-05-22 MED ORDER — MIDAZOLAM HCL 2 MG/2ML IJ SOLN
INTRAMUSCULAR | Status: AC
  Filled 2014-05-22: qty 2

## 2014-05-22 MED ORDER — DILTIAZEM HCL 100 MG IV SOLR
5.0000 mg/h | INTRAVENOUS | Status: DC
  Administered 2014-05-22: 5 mg/h via INTRAVENOUS
  Filled 2014-05-22: qty 100

## 2014-05-22 SURGICAL SUPPLY — 92 items
ATTRACTOMAT 16X20 MAGNETIC DRP (DRAPES) ×3 IMPLANT
BAG DECANTER FOR FLEXI CONT (MISCELLANEOUS) ×3 IMPLANT
BANDAGE ELASTIC 4 VELCRO ST LF (GAUZE/BANDAGES/DRESSINGS) ×3 IMPLANT
BANDAGE ELASTIC 6 VELCRO ST LF (GAUZE/BANDAGES/DRESSINGS) ×3 IMPLANT
BASKET HEART  (ORDER IN 25'S) (MISCELLANEOUS) ×1
BASKET HEART (ORDER IN 25'S) (MISCELLANEOUS) ×1
BASKET HEART (ORDER IN 25S) (MISCELLANEOUS) ×1 IMPLANT
BENZOIN TINCTURE PRP APPL 2/3 (GAUZE/BANDAGES/DRESSINGS) ×3 IMPLANT
BLADE 11 SAFETY STRL DISP (BLADE) ×3 IMPLANT
BLADE STERNUM SYSTEM 6 (BLADE) ×3 IMPLANT
BNDG GAUZE ELAST 4 BULKY (GAUZE/BANDAGES/DRESSINGS) ×3 IMPLANT
CANISTER SUCTION 2500CC (MISCELLANEOUS) ×3 IMPLANT
CANNULA EZ GLIDE AORTIC 21FR (CANNULA) ×3 IMPLANT
CARDIAC SUCTION (MISCELLANEOUS) ×3 IMPLANT
CATH CPB KIT HENDRICKSON (MISCELLANEOUS) ×3 IMPLANT
CATH ROBINSON RED A/P 18FR (CATHETERS) ×3 IMPLANT
CATH THORACIC 36FR (CATHETERS) ×3 IMPLANT
CATH THORACIC 36FR RT ANG (CATHETERS) ×3 IMPLANT
CLIP TI MEDIUM 24 (CLIP) IMPLANT
CLIP TI WIDE RED SMALL 24 (CLIP) IMPLANT
CLOSURE STERI-STRIP 1/2X4 (GAUZE/BANDAGES/DRESSINGS) ×1
CLOSURE WOUND 1/2 X4 (GAUZE/BANDAGES/DRESSINGS) ×1
CLSR STERI-STRIP ANTIMIC 1/2X4 (GAUZE/BANDAGES/DRESSINGS) ×2 IMPLANT
CONT SPEC 4OZ CLIKSEAL STRL BL (MISCELLANEOUS) ×3 IMPLANT
COVER SURGICAL LIGHT HANDLE (MISCELLANEOUS) ×3 IMPLANT
CRADLE DONUT ADULT HEAD (MISCELLANEOUS) ×3 IMPLANT
DRAPE CARDIOVASCULAR INCISE (DRAPES) ×2
DRAPE SLUSH/WARMER DISC (DRAPES) ×3 IMPLANT
DRAPE SRG 135X102X78XABS (DRAPES) ×1 IMPLANT
DRSG AQUACEL AG ADV 3.5X14 (GAUZE/BANDAGES/DRESSINGS) ×3 IMPLANT
DRSG COVADERM 4X14 (GAUZE/BANDAGES/DRESSINGS) ×3 IMPLANT
ELECT REM PT RETURN 9FT ADLT (ELECTROSURGICAL) ×6
ELECTRODE REM PT RTRN 9FT ADLT (ELECTROSURGICAL) ×2 IMPLANT
GAUZE SPONGE 4X4 12PLY STRL (GAUZE/BANDAGES/DRESSINGS) ×6 IMPLANT
GLOVE SURG SIGNA 7.5 PF LTX (GLOVE) ×9 IMPLANT
GOWN STRL REUS W/ TWL LRG LVL3 (GOWN DISPOSABLE) ×4 IMPLANT
GOWN STRL REUS W/ TWL XL LVL3 (GOWN DISPOSABLE) ×2 IMPLANT
GOWN STRL REUS W/TWL LRG LVL3 (GOWN DISPOSABLE) ×8
GOWN STRL REUS W/TWL XL LVL3 (GOWN DISPOSABLE) ×4
HANDLE STAPLE ENDO GIA SHORT (STAPLE) ×2
HEMOSTAT POWDER SURGIFOAM 1G (HEMOSTASIS) ×9 IMPLANT
HEMOSTAT SURGICEL 2X14 (HEMOSTASIS) ×3 IMPLANT
INSERT FOGARTY XLG (MISCELLANEOUS) IMPLANT
KIT BASIN OR (CUSTOM PROCEDURE TRAY) ×3 IMPLANT
KIT ROOM TURNOVER OR (KITS) ×3 IMPLANT
KIT SUCTION CATH 14FR (SUCTIONS) ×6 IMPLANT
KIT VASOVIEW W/TROCAR VH 2000 (KITS) ×3 IMPLANT
MARKER GRAFT CORONARY BYPASS (MISCELLANEOUS) ×9 IMPLANT
NS IRRIG 1000ML POUR BTL (IV SOLUTION) ×15 IMPLANT
PACK OPEN HEART (CUSTOM PROCEDURE TRAY) ×3 IMPLANT
PAD ARMBOARD 7.5X6 YLW CONV (MISCELLANEOUS) ×6 IMPLANT
PAD ELECT DEFIB RADIOL ZOLL (MISCELLANEOUS) ×3 IMPLANT
PENCIL BUTTON HOLSTER BLD 10FT (ELECTRODE) ×6 IMPLANT
PUNCH AORTIC ROTATE 4.0MM (MISCELLANEOUS) IMPLANT
PUNCH AORTIC ROTATE 4.5MM 8IN (MISCELLANEOUS) ×3 IMPLANT
PUNCH AORTIC ROTATE 5MM 8IN (MISCELLANEOUS) IMPLANT
RELOAD EGIA 45 MED/THCK PURPLE (STAPLE) ×3 IMPLANT
SET CARDIOPLEGIA MPS 5001102 (MISCELLANEOUS) ×3 IMPLANT
SPONGE GAUZE 4X4 12PLY STER LF (GAUZE/BANDAGES/DRESSINGS) ×6 IMPLANT
STAPLER ENDO GIA 12MM SHORT (STAPLE) ×1 IMPLANT
STRIP CLOSURE SKIN 1/2X4 (GAUZE/BANDAGES/DRESSINGS) ×2 IMPLANT
SUT BONE WAX W31G (SUTURE) ×3 IMPLANT
SUT MNCRL AB 4-0 PS2 18 (SUTURE) ×3 IMPLANT
SUT PROLENE 3 0 SH DA (SUTURE) ×3 IMPLANT
SUT PROLENE 4 0 RB 1 (SUTURE)
SUT PROLENE 4 0 SH DA (SUTURE) ×3 IMPLANT
SUT PROLENE 4-0 RB1 .5 CRCL 36 (SUTURE) IMPLANT
SUT PROLENE 6 0 C 1 30 (SUTURE) ×6 IMPLANT
SUT PROLENE 7 0 BV1 MDA (SUTURE) ×6 IMPLANT
SUT PROLENE 8 0 BV175 6 (SUTURE) IMPLANT
SUT STEEL 6MS V (SUTURE) ×3 IMPLANT
SUT STEEL STERNAL CCS#1 18IN (SUTURE) IMPLANT
SUT STEEL SZ 6 DBL 3X14 BALL (SUTURE) ×3 IMPLANT
SUT VIC AB 1 CTX 36 (SUTURE) ×4
SUT VIC AB 1 CTX36XBRD ANBCTR (SUTURE) ×2 IMPLANT
SUT VIC AB 2-0 CT1 27 (SUTURE) ×2
SUT VIC AB 2-0 CT1 TAPERPNT 27 (SUTURE) ×1 IMPLANT
SUT VIC AB 2-0 CTX 27 (SUTURE) IMPLANT
SUT VIC AB 3-0 SH 27 (SUTURE) ×2
SUT VIC AB 3-0 SH 27X BRD (SUTURE) ×1 IMPLANT
SUT VIC AB 3-0 X1 27 (SUTURE) IMPLANT
SUT VICRYL 4-0 PS2 18IN ABS (SUTURE) IMPLANT
SUTURE E-PAK OPEN HEART (SUTURE) ×3 IMPLANT
SYSTEM SAHARA CHEST DRAIN ATS (WOUND CARE) ×3 IMPLANT
TAPE CLOTH SURG 4X10 WHT LF (GAUZE/BANDAGES/DRESSINGS) ×3 IMPLANT
TOWEL OR 17X24 6PK STRL BLUE (TOWEL DISPOSABLE) ×6 IMPLANT
TOWEL OR 17X26 10 PK STRL BLUE (TOWEL DISPOSABLE) ×6 IMPLANT
TRAY FOLEY IC TEMP SENS 16FR (CATHETERS) ×3 IMPLANT
TUBE FEEDING 8FR 16IN STR KANG (MISCELLANEOUS) ×3 IMPLANT
TUBING INSUFFLATION (TUBING) ×3 IMPLANT
UNDERPAD 30X30 INCONTINENT (UNDERPADS AND DIAPERS) ×3 IMPLANT
WATER STERILE IRR 1000ML POUR (IV SOLUTION) ×6 IMPLANT

## 2014-05-22 NOTE — Progress Notes (Signed)
Swan coiled in PA on CXR Pulled back in to RV and refloated without difficulty

## 2014-05-22 NOTE — H&P (Signed)
Admission History and Physical     Patient ID: Caleb Wolf, MRN: [id number], DOB: July 26, 1940 72 y.o. Date of Encounter: 05/22/2014, 6:42 AM  Primary Physician: Asencion Noble, MD Primary Cardiologist: Kate Sable  Chief Complaint: Chest Pain  History of Present Illness: Caleb Wolf is a 73 y.o. male with HTN, Hyperlipidemia, and a recent L CEA, who presents with chest pressure.    Of note, pharmacologic nuclear stress was without ischemia in September with normal EF on echo.  He has had recent symptoms concerning for acceleraing angina, and was prescribed imdur 30mg  daily at his last visit with Dr. Bronson Ing 05/12/14.  For the past several weeks he has used SL NTG for exertional chest pain which has helped.   EMS was called this morning for chest pressure, more severe than prior.  He describes the chest pressure this morning as "deeper" than his usual angina, with some tingling in his L arm.  Was found to be in rapid AFib by EMS and was given ASA 324mg  and Diltiazem bolus.  Does not feel palpitations.   In ED his heart rate remained rapid on diltazem gtt, with persistant CP.  +SOB. ECG showed diffuse ST segment depression.   Past Medical History  Diagnosis Date  . Coronary artery disease   . GERD (gastroesophageal reflux disease)   . Arthritis   . Hyperlipidemia     takes Simvastatin daily  . Carotid stenosis     07/04/11- 50-70%bilaterally- stable per Dr Willey Blade note  . Lower leg edema   . Lumbar disc disease   . Gout     takes Indocin daily as needed  . Enlarged prostate     takes Finasteride daily  . History of kidney stones   . PONV (postoperative nausea and vomiting)     also hard to urinate after surgery  . Sleep apnea     uses CPAP  . Hypertension     takes Clonidine,Verapamil,HCTZ,and Lisinopril daily  . Joint pain   . Chronic back pain   . History of colon polyps   . Urinary urgency      Past Surgical History  Procedure Laterality Date  . Fracture surgery       bilateral wrist fractures- one ORIF  . Rhinoplasty    . Joint replacement  2011    left knee  . Eye surgery      cataract extraction with repair macular tear , with IOL     right  . Total hip arthroplasty  12/08/2011    Procedure: TOTAL HIP ARTHROPLASTY;  Surgeon: Gearlean Alf, MD;  Location: [facility] ORS;  Service: Orthopedics;  Laterality: Right;  . Tonsillectomy    . Wrist surgery Right 53yrs ago  . Lithotripsy    . Back surgery    . Wrist surgery Left   . Cataract surgery Right   . Colonoscopy    . 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 Endarterectomy Left 02/17/2014    Procedure: ENDARTERECTOMY CAROTID WITH PATCH ANGIOPLASTY;  Surgeon: Mal Misty, MD;  Location: [facility] OR;  Service: Vascular;  Laterality: Left;      Current Facility-Administered Medications  Medication Dose Route Frequency Provider Last Rate Last Dose  . acetaminophen (TYLENOL) tablet 650 mg  650 mg Oral Q4H PRN Stephani Police, MD      . amiodarone (NEXTERONE PREMIX) 360 MG/200ML (1.8 mg/mL) IV infusion  60 mg/hr Intravenous Continuous Stephani Police, MD 33.3 mL/hr at 05/22/14 [id number] 60  mg/hr at 05/22/14 9924   Followed by  . amiodarone (NEXTERONE PREMIX) 360 MG/200ML (1.8 mg/mL) IV infusion  30 mg/hr Intravenous Continuous Stephani Police, MD      . amiodarone (NEXTERONE PREMIX) 360 MG/200ML (1.8 mg/mL) IV infusion           . [START ON 05/23/2014] aspirin EC tablet 81 mg  81 mg Oral Daily Stephani Police, MD      . atorvastatin (LIPITOR) tablet 40 mg  40 mg Oral q1800 Stephani Police, MD      . cloNIDine (CATAPRES) tablet 0.3 mg  0.3 mg Oral BID Stephani Police, MD      . diltiazem (CARDIZEM) 100 mg in dextrose 5 % 100 mL (1 mg/mL) infusion  5-15 mg/hr Intravenous Continuous Varney Biles, MD 15 mL/hr at 05/22/14 0609 15 mg/hr at 05/22/14 0609  . heparin ADULT infusion 100 units/mL (25000 units/250 mL)  1,500 Units/hr Intravenous Continuous Rogue Bussing, [facility]      . heparin bolus via infusion 4,000 Units   4,000 Units Intravenous Once Rogue Bussing, [facility]      . morphine 4 MG/ML injection 4 mg  4 mg Intravenous Once Stephani Police, MD      . nitroGLYCERIN (NITROSTAT) SL tablet 0.4 mg  0.4 mg Sublingual Q5 Min x 3 PRN Stephani Police, MD      . ondansetron [facility]) injection 4 mg  4 mg Intravenous Q6H PRN Stephani Police, MD      . sodium chloride 0.9 % bolus 1,000 mL  1,000 mL Intravenous Once Varney Biles, MD 1,000 mL/hr at 05/22/14 0613 1,000 mL at 05/22/14 2683   Current Outpatient Prescriptions  Medication Sig Dispense Refill  . aspirin 81 MG tablet Take 81 mg by mouth daily.    . cloNIDine (CATAPRES) 0.3 MG tablet Take 1 tablet (0.3 mg total) by mouth every evening. (Patient taking differently: Take 0.3 mg by mouth 2 (two) times daily. ) 30 tablet 6  . doxazosin (CARDURA) 2 MG tablet Take 2 mg by mouth daily.    . finasteride (PROSCAR) 5 MG tablet Take 5 mg by mouth at bedtime.     . hydrochlorothiazide (HYDRODIURIL) 25 MG tablet Take 25 mg by mouth daily with breakfast.    . indomethacin (INDOCIN) 50 MG capsule Take 50 mg by mouth 2 (two) times daily as needed (for gout attack). Will take for 2-3 days during attack    . isosorbide mononitrate (IMDUR) 30 MG 24 hr tablet Take 1 tablet (30 mg total) by mouth daily. 90 tablet 3  . lisinopril (PRINIVIL,ZESTRIL) 40 MG tablet Take 40 mg by mouth at bedtime.     . nitroGLYCERIN (NITROSTAT) 0.4 MG SL tablet Place 1 tablet (0.4 mg total) under the tongue every 5 (five) minutes as needed for chest pain. 25 tablet 3  . simvastatin (ZOCOR) 10 MG tablet Take 10 mg by mouth at bedtime.    . TESTIM 50 MH/9QQ GEL Apply 2 clicks as needed up three times weekly    . verapamil (CALAN) 80 MG tablet Take 1 tablet (80 mg total) by mouth 3 (three) times daily. (Patient taking differently: Take 80 mg by mouth daily. ) 30 tablet 6  . naproxen sodium (ALEVE) 220 MG tablet Take 220 mg by mouth as needed (pain).     Marland Kitchen oxyCODONE (ROXICODONE) 5 MG immediate release tablet  Take 1 tablet (5 mg total) by mouth every 6 (six) hours as needed for severe pain. (Patient not taking: Reported on  05/22/2014) 20 tablet 0      Allergies: Allergies  Allergen Reactions  . Codeine Sulfate Nausea Only     Social History:  The patient  reports that he quit smoking about 31 years ago. His smoking use included Cigarettes. He started smoking about 53 years ago. He has a 50 pack-year smoking history. He has never used smokeless tobacco. He reports that he drinks alcohol. He reports that he does not use illicit drugs.   Family History:  The patient's family history includes Allergies in his mother; Heart disease in his father and mother; Hypertension in his mother.   ROS:  Please see the history of present illness.  All other systems reviewed and negative.   Vital Signs: Blood pressure 151/74, pulse 141, temperature 98.9 F (37.2 C), temperature source Oral, resp. rate 19, height 5\' 9"  (1.753 m), weight 120.203 kg (265 lb), SpO2 95 %.  PHYSICAL EXAM: General:  Well nourished, well developed, in moderate distress HEENT: normal Lymph: no adenopathy Neck: no JVD Endocrine:  No thryomegaly Cardiac:  Tachycardic, irregularly irreg. No murmurs.   Lungs:  clear to auscultation bilaterally, no wheezing, rhonchi or rales Abd: soft, nontender, no hepatomegaly Ext: no edema Musculoskeletal:  No deformities, BUE and BLE strength normal and equal Skin: warm and dry Neuro:  CNs 2-12 intact, no focal abnormalities noted Psych:  Normal affect   EKG: AFib with RVR, diffuse ST segment depression, along with elevation in AVR.    Labs:   Lab Results  Component Value Date   WBC 10.0 05/22/2014   HGB 13.6 05/22/2014   HCT 41.8 05/22/2014   MCV 92.7 05/22/2014   PLT 231 05/22/2014   No results for input(s): NA, K, CL, CO2, BUN, CREATININE, CALCIUM, PROT, BILITOT, ALKPHOS, ALT, AST, GLUCOSE in the last 168 hours.  Invalid input(s): LABALBU  Recent Labs  05/22/14 0548    TROPONINI 1.05*   No results found for: CHOL, HDL, LDLCALC, TRIG No results found for: DDIMER  Radiology/Studies:  No results found.   ASSESSMENT AND PLAN:   This is a 73 yo M with multiple risk factors for CAD who presents with CP in rapid afib, with + troponin and diffuse ST segment depression.  1. Rapid Afib - Difficult to control.  HR in the 150s on diltiazem gtt.   - Amiodarone gtt with bolus started in addition to diltiazem.  May need DCCV if does not respond due to NSTEMI with ongoing pain. - Heparin gtt - Admit to ICU  2.  NSTEMI - With ongoing chest pressure in the setting of AFib with RVR - heparin, aspirin - Aggressive HR control as above. - Cath once HR stabilzed as ECG is concerning for multivessel or LM disease. - Morphine PRN - Echo ordered  3. HTN - Continue clonidine 0.3 BID but hold other meds until AFib under better control    Signed,  Stephani Police, MD 05/22/2014, 6:42 AM

## 2014-05-22 NOTE — ED Provider Notes (Signed)
CSN: [id number]     Arrival date & time 05/22/14  0867 History   First MD Initiated Contact with Patient 05/22/14 0543     Chief Complaint  Patient presents with  . Code STEMI     (Consider location/radiation/quality/duration/timing/severity/associated sxs/prior Treatment) HPI Comments: PT comes in with cc of chest pain. Pt has hx of CAD, HTN, and is s/p carotid endarterectomy. Pt reports waking up in the middle of the night, and having chest pain, palpitations. Chest pain is left sided, pressure like and he has tingling in his left arm. Pt took nitro -no relief, called EMS. EMS noted HR in the 170s, pt given cardiazem 20 mg, and EKS was sent for [place] to evaluate. Pt arrives to the ER with HR in the 129. Pt is clammy. BP is stable. Still having chest pain. EKG shows ST elevation avR, and diffuse ST depression. He has no hx of afib. Spoke with STEMI team, and they want HR control first.    The history is provided by the patient.    Past Medical History  Diagnosis Date  . Coronary artery disease   . GERD (gastroesophageal reflux disease)   . Arthritis   . Hyperlipidemia     takes Simvastatin daily  . Carotid stenosis     07/04/11- 50-70%bilaterally- stable per Dr Willey Blade note  . Lower leg edema   . Lumbar disc disease   . Gout     takes Indocin daily as needed  . Enlarged prostate     takes Finasteride daily  . History of kidney stones   . PONV (postoperative nausea and vomiting)     also hard to urinate after surgery  . Sleep apnea     uses CPAP  . Hypertension     takes Clonidine,Verapamil,HCTZ,and Lisinopril daily  . Joint pain   . Chronic back pain   . History of colon polyps   . Urinary urgency    Past Surgical History  Procedure Laterality Date  . Fracture surgery      bilateral wrist fractures- one ORIF  . Rhinoplasty    . Joint replacement  2011    left knee  . Eye surgery      cataract extraction with repair macular tear , with IOL     right  . Total hip  arthroplasty  12/08/2011    Procedure: TOTAL HIP ARTHROPLASTY;  Surgeon: Gearlean Alf, MD;  Location: [facility] ORS;  Service: Orthopedics;  Laterality: Right;  . Tonsillectomy    . Wrist surgery Right 69yrs ago  . Lithotripsy    . Back surgery    . Wrist surgery Left   . Cataract surgery Right   . Colonoscopy    . 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 Endarterectomy Left 02/17/2014    Procedure: ENDARTERECTOMY CAROTID WITH PATCH ANGIOPLASTY;  Surgeon: Mal Misty, MD;  Location[facility];  Service: Vascular;  Laterality: Left;   Family History  Problem Relation Age of Onset  . Allergies Mother   . Heart disease Mother   . Hypertension Mother   . Heart disease Father     MI   History  Substance Use Topics  . Smoking status: Former Smoker -- 2.00 packs/day for 25 years    Types: Cigarettes    Start date: 08/08/1960    Quit date: 06/02/1982  . Smokeless tobacco: Never Used     Comment: quit smoking 30+yrs ago  . Alcohol Use: 0.0 oz/week  0 Not specified per week     Comment: occasionally     Review of Systems  Constitutional: Positive for diaphoresis. Negative for fever, chills and activity change.  Eyes: Negative for visual disturbance.  Respiratory: Negative for cough, chest tightness and shortness of breath.   Cardiovascular: Positive for chest pain.  Gastrointestinal: Negative for abdominal distention.  Genitourinary: Negative for dysuria, enuresis and difficulty urinating.  Musculoskeletal: Negative for arthralgias and neck pain.  Neurological: Positive for dizziness, light-headedness and headaches. Negative for weakness.  Psychiatric/Behavioral: Negative for confusion.      Allergies  Codeine sulfate  Home Medications   Prior to Admission medications   Medication Sig Start Date End Date Taking? Authorizing Provider  aspirin 81 MG tablet Take 81 mg by mouth daily.   Yes Historical Provider, MD  cloNIDine (CATAPRES) 0.3 MG tablet Take 1 tablet (0.3  mg total) by mouth every evening. Patient taking differently: Take 0.3 mg by mouth 2 (two) times daily.  03/14/14  Yes Herminio Commons, MD  doxazosin (CARDURA) 2 MG tablet Take 2 mg by mouth daily.   Yes Historical Provider, MD  finasteride (PROSCAR) 5 MG tablet Take 5 mg by mouth at bedtime.    Yes Historical Provider, MD  hydrochlorothiazide (HYDRODIURIL) 25 MG tablet Take 25 mg by mouth daily with breakfast.   Yes Historical Provider, MD  indomethacin (INDOCIN) 50 MG capsule Take 50 mg by mouth 2 (two) times daily as needed (for gout attack). Will take for 2-3 days during attack   Yes Historical Provider, MD  isosorbide mononitrate (IMDUR) 30 MG 24 hr tablet Take 1 tablet (30 mg total) by mouth daily. 05/12/14  Yes Herminio Commons, MD  lisinopril (PRINIVIL,ZESTRIL) 40 MG tablet Take 40 mg by mouth at bedtime.    Yes Historical Provider, MD  nitroGLYCERIN (NITROSTAT) 0.4 MG SL tablet Place 1 tablet (0.4 mg total) under the tongue every 5 (five) minutes as needed for chest pain. 02/15/14  Yes Herminio Commons, MD  simvastatin (ZOCOR) 10 MG tablet Take 10 mg by mouth at bedtime.   Yes Historical Provider, MD  TESTIM 50 DG/6YQ GEL Apply 2 clicks as needed up three times weekly 07/26/12  Yes Historical Provider, MD  verapamil (CALAN) 80 MG tablet Take 1 tablet (80 mg total) by mouth 3 (three) times daily. Patient taking differently: Take 80 mg by mouth daily.  05/12/14  Yes Herminio Commons, MD  naproxen sodium (ALEVE) 220 MG tablet Take 220 mg by mouth as needed (pain).     Historical Provider, MD  oxyCODONE (ROXICODONE) 5 MG immediate release tablet Take 1 tablet (5 mg total) by mouth every 6 (six) hours as needed for severe pain. Patient not taking: Reported on 05/22/2014 02/17/14   Joelene Millin A Trinh, PA-C   BP 159/76 mmHg  Pulse 110  Temp(Src) 98.9 F (37.2 C) (Oral)  Resp 26  Ht 5\' 9"  (1.753 m)  Wt 265 lb (120.203 kg)  BMI 39.12 kg/m2  SpO2 97% Physical Exam  Constitutional:  He is oriented to person, place, and time. He appears well-developed.  HENT:  Head: Normocephalic and atraumatic.  Eyes: Conjunctivae and EOM are normal. Pupils are equal, round, and reactive to light.  Neck: Normal range of motion. Neck supple.  Cardiovascular: Regular rhythm.   Murmur heard. Pulmonary/Chest: Effort normal and breath sounds normal.  Abdominal: Soft. Bowel sounds are normal. He exhibits no distension. There is no tenderness. There is no rebound and no guarding.  Neurological:  He is alert and oriented to person, place, and time.  Skin: Skin is warm. He is diaphoretic.  Nursing note and vitals reviewed.   ED Course  Procedures (including critical care time) Labs Review Labs Reviewed  BASIC METABOLIC PANEL - Abnormal; Notable for the following:    Glucose, Bld 131 (*)    GFR calc non Af Amer 57 (*)    GFR calc Af Amer 66 (*)    Anion gap 17 (*)    All other components within normal limits  TROPONIN I - Abnormal; Notable for the following:    Troponin I 1.05 (*)    All other components within normal limits  PHOSPHORUS - Abnormal; Notable for the following:    Phosphorus 2.0 (*)    All other components within normal limits  CBC WITH DIFFERENTIAL  APTT  PROTIME-INR  MAGNESIUM  HEPARIN LEVEL (UNFRACTIONATED)    Imaging Review Dg Chest Port 1 View  05/22/2014   CLINICAL DATA:  Acute onset of sharp generalized chest pain. Initial encounter.  EXAM: PORTABLE CHEST - 1 VIEW  COMPARISON:  Chest radiograph performed 02/16/2014  FINDINGS: The lungs are well-aerated. Mild peribronchial thickening is noted. Mild right basilar opacity may reflect atelectasis or possibly pneumonia. There is no evidence of pleural effusion or pneumothorax.  The cardiomediastinal silhouette is borderline normal in size. No acute osseous abnormalities are seen.  IMPRESSION: Mild peribronchial thickening noted. Mild right basilar airspace opacity may reflect atelectasis or possibly pneumonia.    Electronically Signed   By: Garald Balding [name]   On: 05/22/2014 06:44     EKG Interpretation   Date/Time:  Monday May 22 2014 05:44:15 EST Ventricular Rate:  147 PR Interval:    QRS Duration: 83 QT Interval:  253 QTC Calculation: 396 R Axis:   33 Text Interpretation:  Atrial flutter with predominant 2:1 AV block  Probable anteroseptal infarct, recent Abnormal T, consider ischemia,  diffuse leads Lateral leads are also involved ST elevation in avR with  diffuse ST depression New Afib Confirmed by Kathrynn Humble, MD, Thelma Comp [phone]) on  05/22/2014 7:41:19 AM      MDM   Final diagnoses:  Chest pain  NSTEMI (non-ST elevated myocardial infarction)  Atrial fibrillation with RVR  Abnormal EKG   CRITICAL CARE Performed by: Varney Biles   Total critical care time: 44 min  Critical care time was exclusive of separately billable procedures and treating other patients.  Critical care was necessary to treat or prevent imminent or life-threatening deterioration.  Critical care was time spent personally by me on the following activities: development of treatment plan with patient and/or surrogate as well as nursing, discussions with consultants, evaluation of patient's response to treatment, examination of patient, obtaining history from patient or surrogate, ordering and performing treatments and interventions, ordering and review of laboratory studies, ordering and review of radiographic studies, pulse oximetry and re-evaluation of patient's condition.   Pt comes in with new onset afib, he is RVR, also having chest pain, typical, and is suspected to have CAD based on his previous symptoms and stress results. Spoke with STEMI staff - they want HR control. Cards at bedside. Dilt bolus and drip started, with modest improvement in HR, but not at goal yet.  Trop elevate.d ASA given, heparin started. STable for admission.  Varney Biles, MD 05/22/14 [phone]

## 2014-05-22 NOTE — CV Procedure (Signed)
CARDIAC CATHETERIZATION REPORT  NAME:  RENO CLASBY   MRN: [id number] DOB:  10-Jul-1940   ADMIT DATE: 05/22/2014 Procedure Date: 05/22/2014  INTERVENTIONAL CARDIOLOGIST: Leonie Man, [name], MS PRIMARY CARE PROVIDER: Asencion Noble, MD PRIMARY CARDIOLOGIST:  Kate Sable, MD  PATIENT:  Caleb Wolf is a 73 y.o. male with history of known carotid disease status post left carotid endarterectomy and right carotid stenosis has hypertension and hyperlipidemia and history of long-term heavy smoking. However he quit 30 years ago. He was seen by Dr. Bronson Ing on December 11 for evaluation of chest pain. He has had a Myoview performed that was negative for ischemia. He has continued to have chest discomfort. He then presented to [facility] emergency room with acute onset anginal chest discomfort and was found to be in atrial fibrillation with RVR. There were diffuse ST segment depressions. Code STEMI was canceled however he continued to have ongoing pain throughout the night. He was evaluated by Dr. Irish Lack in the morning who felt that with positive troponin levels, ischemic ST changes and ongoing pain he warranted urgent catheterization. He was brought to the catheterization laboratory directly from emergency room.  In the emergency room he was started on amiodarone drip as well as diltiazem. He was initiated on IV heparin drip after bolus of 4000 units.  PRE-OPERATIVE DIAGNOSIS:    Non-ST elevation MI  Atrial ablation with RVR  PROCEDURES PERFORMED:    Left Heart Catheterization with Native Coronary Angiography  via Right Radial Artery   Intra-aortic balloon pump placement via Right Common Femoral artery  PROCEDURE: The patient was brought to the 2nd [facility] Catheter[facility] Lab in the fasting state and prepped and draped in the usual sterile fashion for Right Radial artery access. A modified Allen's test was performed on the right wrist demonstrating excellent collateral flow  for radial access.   Sterile technique was used including antiseptics, cap, gloves, gown, hand hygiene, mask and sheet. Skin prep: Chlorhexidine.   Consent: Risks of procedure as well as the alternatives and risks of each were explained to the (patient/caregiver). Consent for procedure obtained.   Time Out: Verified patient identification, verified procedure, site/side was marked, verified correct patient position, special equipment/implants available, medications/allergies/relevent history reviewed, required imaging and test results available. Performed.  Access:   Right Radial Artery: 6 Fr Sheath -  Seldinger Technique (Angiocath Micropuncture Kit)  Radial Cocktail - 10 mL; IV Heparin 3000 Units After reviewing the initial angiography, the decision was made to place an Intra-Aortic Balloon Pump   Right Common Femoral Artery: 7.5 Fr Sheath -  fluoroscopically guided modified Seldinger Technique  Left Heart Catheterization: 5 Fr Catheters advanced or exchanged over a Long Exchange Safety J-wire; TIG 4.0 catheter advanced first.  Left Coronary Artery Cineangiography: EBU 4.0 Catheter  Right Coronary Artery  Cineangiography: TIG 4.0 Catheter   LV Hemodynamics: TIG 4.0  Right radial sheath was sutured in placed to be used for arterial pressures intraoperatively Right common femoral sheath was sutured in place for balloon pump.  FINDINGS:  Hemodynamics:   Central Aortic Pressure / Mean:  125/63/87  mmHg  Left Ventricular Pressure / LVEDP: 127/24/34 mmHg  Left Ventriculography: Not assessed; EF was 50-65% by echocardiogram in September  Coronary Anatomy:  Dominance: Left  Left Main: Heavily calcified ostial left main with roughly 80% stenosis. There is pressure dampening 5 [place] catheter engagement. The distal left main is relatively free of disease it bifurcates into the LAD and circumflex. LAD: Large-caliber vessel  with a proximal 99% stenosis at the takeoff of a small first  diagonal branch (D1) that is also involved with a 90% stenosis. The remainder the LAD is relatively free of disease in which is down around the apex perfusing the distal inferoapex. There is a major mid vessel diagonal branch (D2) that is relatively free of disease.  Left Circumflex: Large-caliber, dominant vessel. There is ostial 60% stenosis. The vessel then bifurcates in the AV groove into a large lateral OM branch which has a 70-80% ostial stenosis. The AV groove circumflex continues on to give rise to a left posterior lateral and left posterior descending artery.  OM1: Large-caliber vessel, as noted ostial 70-80% stenosis. The rest the vessels. Disease. Bifurcates distally.   RCA: Small caliber, nondominant vessel which basically courses to give 2 marginal branches.  After reviewing the initial angiography, the culprit lesion was thought to be accommodation of the ostial Left Main 80% and proximal LAD 99% lesion.Marland Kitchen  Preparation were made to Place an Intra-Aortic Balloon Pump for ongoing anginal chest pain with high-grade disease. [place] surgery consultation was obtained. The patient will require urgent CABG.  Intra-Aortic Balloon Pump Placement  Right common femoral access as above. 6 French sheath initially placed was exchanged for the 7.5 French IABP sheath.  40 cm IABP was advanced over the 0.25 mm wire into the aortic knob.  It was visualized angiographically and all lines were flushed.  The balloon pump was initiated at one to one ratio using pressure tracing.  MEDICATIONS:  Anesthesia:  Local Lidocaine 2 mL for right radial, 15 mL for right common femoral  Sedation:  1 mg IV Versed, 75 mcg IV fentanyl ;   Premedication: 4000 units of IV heparin  Omnipaque Contrast: 40 ml  Anticoagulation:  IV Heparin additional 3000 Units   Anti-Platelet Agent:  None Radial Cocktail: 5 mg Verapamil, 400 mcg NTG, 2 ml 2% Lidocaine in 10 ml NS  PATIENT DISPOSITION:    The patient was  transferred to the PACU holding area in a hemodynamicaly stable, chest pain free condition.  The patient tolerated the procedure well, and there were no complications.  EBL:   < 20 ml  The patient was stable before, during, and after the procedure.  POST-OPERATIVE DIAGNOSIS:    Severe multivessel disease including left main coronary disease with 80% ostial left main as well as 99% proximal LAD, 60 ostial dominant circumflex with 70-80% ostial large lateral OM  Ongoing 7 out of 10 chest pain with successful placement of intra-aortic balloon pump.  Atrial fibrillation with RVR upon initial presentation, rate is relatively controlled in the 90s now.  PLAN OF CARE:  The patient will be transferred directly to the cardiac catheterization lab to the preoperative staging area for urgent CABG. Dr. Roxan Hockey is seen the patient and evaluated him in the Cath Lab. He has discussed the plans with the patient's wife as well.  Would continue IV amiodarone perioperatively for additional rate control. I have stopped the IV nitroglycerin to avoid hypotension.  Upon discharge the patient will be following up with Dr. Bronson Ing.  Leonie Man, [name], M.S. Interventional Cardiologist   Pager # [phone]

## 2014-05-22 NOTE — Anesthesia Procedure Notes (Signed)
Anesthesia Procedure Note PA catheter:  Routine monitors. Timeout, sterile prep, drape, FBP R neck.  Trendelenburg position.  Unable to visualize RIJ with [place].  Re-prep, drape, FBP L neck. 1% [place] local, finder and trocar LIJ 1st pass with US guidance.  Cordis placed over J wire. PA catheter in easily.  Sterile dressing applied.  Patient tolerated well, VSS.  Jenita Seashore, MD  10:19-10:35

## 2014-05-22 NOTE — Progress Notes (Signed)
Yellow metal band given to wife by Magda Paganini

## 2014-05-22 NOTE — Progress Notes (Signed)
1635 removed right radial 10F sheath and applied TR band. Hemostasis achieved at [place] of air. Pt's RN at bedside and viewed site. No hematoma or bleeding. HORTON, Prakash Kimberling C

## 2014-05-22 NOTE — ED Notes (Signed)
Patient presents via EMS.  EMS reports upon their arrival to the house, patient was diaphoretic, central chest pain and tingling in his arms.  Rate was 160-170  Patient took ASA 324mg  and 3 NTG at home.  EMS adm 1 NTG and Diltiazem 20mg   Patient stated his CP started after waking up this AM

## 2014-05-22 NOTE — OR Nursing (Signed)
1st call to SICU at 1422.

## 2014-05-22 NOTE — Anesthesia Preprocedure Evaluation (Addendum)
Anesthesia Evaluation  Patient identified by MRN, date of birth, ID band Patient awake    Reviewed: Allergy & Precautions, H&P , NPO status , Patient's Chart, lab work & pertinent test resultsPreop documentation limited or incomplete due to emergent nature of procedure.  History of Anesthesia Complications (+) PONV and history of anesthetic complications  Airway Mallampati: II  TM Distance: >3 FB Neck ROM: Full    Dental  (+) Teeth Intact, Dental Advisory Given   Pulmonary former smoker (quit 1984),  breath sounds clear to auscultation        Cardiovascular hypertension, Pt. on medications + angina (has IABP for persistent angina) at rest + CAD (cath: LM 80%, proximal LAD 99%) and + Peripheral Vascular Disease + dysrhythmias Atrial Fibrillation Rhythm:Irregular Rate:Normal  Elevated troponin: NSTEMI, now for CABG    Neuro/Psych negative neurological ROS  negative psych ROS   GI/Hepatic Neg liver ROS, GERD-  Controlled and Medicated,  Endo/Other  Morbid obesity  Renal/GU negative Renal ROS     Musculoskeletal   Abdominal (+) + obese,   Peds  Hematology negative hematology ROS (+)   Anesthesia Other Findings   Reproductive/Obstetrics                          Anesthesia Physical Anesthesia Plan  ASA: IV and emergent  Anesthesia Plan: General   Post-op Pain Management:    Induction: Intravenous  Airway Management Planned: Oral ETT  Additional Equipment: Arterial line, CVP, PA Cath, 3D TEE and Ultrasound Guidance Line Placement  Intra-op Plan:   Post-operative Plan: Post-operative intubation/ventilation  Informed Consent: I have reviewed the patients History and Physical, chart, labs and discussed the procedure including the risks, benefits and alternatives for the proposed anesthesia with the patient or authorized representative who has indicated his/her understanding and acceptance.    Dental advisory given and Only emergency history available  Plan Discussed with: CRNA and Surgeon  Anesthesia Plan Comments: (Plan routine monitors, A line, PA catheter, GETA with TEE and post op ventilation)        Anesthesia Quick Evaluation

## 2014-05-22 NOTE — Progress Notes (Signed)
ANTICOAGULATION CONSULT NOTE - Initial Consult  Pharmacy Consult for heparin Indication: atrial fibrillation  Allergies  Allergen Reactions  . Codeine Sulfate Nausea Only    Patient Measurements: Height: 5\' 9"  (175.3 cm) Weight: 265 lb (120.203 kg) IBW/kg (Calculated) : 70.7 Heparin Dosing Weight: 100kg  Vital Signs: Temp: 98.9 F (37.2 C) (12/21 0553) Temp Source: Oral (12/21 0553) BP: 162/90 mmHg (12/21 0553) Pulse Rate: 152 (12/21 0553)  Labs:  Recent Labs  05/22/14 0548  HGB 13.6  HCT 41.8  PLT 231    Estimated Creatinine Clearance: 62.4 mL/min (by C-G formula based on Cr of 1.39).   Medical History: Past Medical History  Diagnosis Date  . Coronary artery disease   . GERD (gastroesophageal reflux disease)   . Arthritis   . Hyperlipidemia     takes Simvastatin daily  . Carotid stenosis     07/04/11- 50-70%bilaterally- stable per Dr Willey Blade note  . Lower leg edema   . Lumbar disc disease   . Gout     takes Indocin daily as needed  . Enlarged prostate     takes Finasteride daily  . History of kidney stones   . PONV (postoperative nausea and vomiting)     also hard to urinate after surgery  . Sleep apnea     uses CPAP  . Hypertension     takes Clonidine,Verapamil,HCTZ,and Lisinopril daily  . Joint pain   . Chronic back pain   . History of colon polyps   . Urinary urgency       Assessment: 73yo male called EMS for diaphoresis, CP, and tingling in his arms; found to be in Afib w/ rate to 170, to begin heparin.  Goal of Therapy:  Heparin level 0.3-0.7 units/ml Monitor platelets by anticoagulation protocol: Yes   Plan:  Will give heparin 4000 units x1 followed by gtt at 1500 units/hr and monitor heparin levels and CBC.  Wynona Neat, PharmD, BCPS  05/22/2014,6:24 AM

## 2014-05-22 NOTE — Progress Notes (Signed)
Chaplain responded to code stemi that was later cancelled. Chaplain provided emotional support for pt and wife, Caleb Wolf. Chaplain informed ED registration the pt neighbors, Caleb Wolf and Caleb Wolf, would be arriving shortly. Pt also has a son who lives in [place]. Pt wife noted they would call their pastor later this morning for additional spiritual support. Pt and family have strong support system. Chaplain offered prayer. Pt and family appreciative of chaplain presence.    05/22/14 0600  Clinical Encounter Type  Visited With Patient and family together;Health care provider  Visit Type Spiritual support;Code;ED  Consult/Referral To Chaplain  Spiritual Encounters  Spiritual Needs Emotional;Prayer  Stress Factors  Patient Stress Factors Health changes;Lack of knowledge  Family Stress Factors Health changes;Lack of knowledge  Caleb Wolf 05/22/2014 6:24 AM

## 2014-05-22 NOTE — Consult Note (Signed)
Reason for Consult:Left main disease/ acute MI Referring Physician: Dr. [name] is an 73 y.o. male.  HPI: 73 yo with cc/o CP  Caleb Wolf presented this morning with acute CP adn SOB. Was in rapid a fib. Troponin +.  Taken urgently to cath lab. 80% left main in a left dominant circulation. Has a tight proximal LAD lesion and disease in the circumflex as well. Had ongoing pain so IABP placed- pain now minimal.  Past Medical History  Diagnosis Date  . Coronary artery disease   . GERD (gastroesophageal reflux disease)   . Arthritis   . Hyperlipidemia     takes Simvastatin daily  . Carotid stenosis     07/04/11- 50-70%bilaterally- stable per Dr Caleb Wolf note  . Lower leg edema   . Lumbar disc disease   . Gout     takes Indocin daily as needed  . Enlarged prostate     takes Finasteride daily  . History of kidney stones   . PONV (postoperative nausea and vomiting)     also hard to urinate after surgery  . Sleep apnea     uses CPAP  . Hypertension     takes Clonidine,Verapamil,HCTZ,and Lisinopril daily  . Joint pain   . Chronic back pain   . History of colon polyps   . Urinary urgency     Past Surgical History  Procedure Laterality Date  . Fracture surgery      bilateral wrist fractures- one ORIF  . Rhinoplasty    . Joint replacement  2011    left knee  . Eye surgery      cataract extraction with repair macular tear , with IOL     right  . Total hip arthroplasty  12/08/2011    Procedure: TOTAL HIP ARTHROPLASTY;  Surgeon: Gearlean Alf, MD;  Location: [facility] ORS;  Service: Orthopedics;  Laterality: Right;  . Tonsillectomy    . Wrist surgery Right 70yr ago  . Lithotripsy    . Back surgery    . Wrist surgery Left   . Cataract surgery Right   . Colonoscopy    . 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 Endarterectomy Left 02/17/2014    Procedure: ENDARTERECTOMY CAROTID WITH PATCH ANGIOPLASTY;  Surgeon: JMal Misty MD;  [facility];  Service:  Vascular;  Laterality: Left;    Family History  Problem Relation Age of Onset  . Allergies Mother   . Heart disease Mother   . Hypertension Mother   . Heart disease Father     MI    Social History:  reports that he quit smoking about 31 years ago. His smoking use included Cigarettes. He started smoking about 53 years ago. He has a 50 pack-year smoking history. He has never used smokeless tobacco. He reports that he drinks alcohol. He reports that he does not use illicit drugs.  Allergies:  Allergies  Allergen Reactions  . Codeine Sulfate Nausea Only    Medications:  Prior to Admission:  Prescriptions prior to admission  Medication Sig Dispense Refill Last Dose  . aspirin 81 MG tablet Take 81 mg by mouth daily.   05/22/2014 at Unknown time  . cloNIDine (CATAPRES) 0.3 MG tablet Take 1 tablet (0.3 mg total) by mouth every evening. (Patient taking differently: Take 0.3 mg by mouth 2 (two) times daily. ) 30 tablet 6 05/21/2014 at Unknown time  . doxazosin (CARDURA) 2 MG tablet Take 2 mg by  mouth daily.   05/21/2014 at Unknown time  . finasteride (PROSCAR) 5 MG tablet Take 5 mg by mouth at bedtime.    05/21/2014 at Unknown time  . hydrochlorothiazide (HYDRODIURIL) 25 MG tablet Take 25 mg by mouth daily with breakfast.   05/21/2014 at Unknown time  . indomethacin (INDOCIN) 50 MG capsule Take 50 mg by mouth 2 (two) times daily as needed (for gout attack). Will take for 2-3 days during attack   05/21/2014 at Unknown time  . isosorbide mononitrate (IMDUR) 30 MG 24 hr tablet Take 1 tablet (30 mg total) by mouth daily. 90 tablet 3 05/21/2014 at Unknown time  . lisinopril (PRINIVIL,ZESTRIL) 40 MG tablet Take 40 mg by mouth at bedtime.    05/21/2014 at Unknown time  . nitroGLYCERIN (NITROSTAT) 0.4 MG SL tablet Place 1 tablet (0.4 mg total) under the tongue every 5 (five) minutes as needed for chest pain. 25 tablet 3 05/22/2014 at Unknown time  . simvastatin (ZOCOR) 10 MG tablet Take 10 mg by mouth  at bedtime.   05/21/2014 at Unknown time  . TESTIM 50 YS/1UO GEL Apply 2 clicks as needed up three times weekly   Past Week at Unknown time  . verapamil (CALAN) 80 MG tablet Take 1 tablet (80 mg total) by mouth 3 (three) times daily. (Patient taking differently: Take 80 mg by mouth daily. ) 30 tablet 6 05/21/2014 at Unknown time  . naproxen sodium (ALEVE) 220 MG tablet Take 220 mg by mouth as needed (pain).    Not Taking at Unknown time  . oxyCODONE (ROXICODONE) 5 MG immediate release tablet Take 1 tablet (5 mg total) by mouth every 6 (six) hours as needed for severe pain. (Patient not taking: Reported on 05/22/2014) 20 tablet 0 Not Taking at Unknown time    Results for orders placed or performed during the hospital encounter of 05/22/14 (from the past 48 hour(s))  CBC with Differential     Status: None   Collection Time: 05/22/14  5:48 AM  Result Value Ref Range   WBC 10.0 4.0 - 10.5 K/uL   RBC 4.51 4.22 - 5.81 MIL/uL   Hemoglobin 13.6 13.0 - 17.0 g/dL   HCT 41.8 39.0 - 52.0 %   MCV 92.7 78.0 - 100.0 fL   MCH 30.2 26.0 - 34.0 pg   MCHC 32.5 30.0 - 36.0 g/dL   RDW 13.1 11.5 - 15.5 %   Platelets 231 150 - 400 K/uL   Neutrophils Relative % 68 43 - 77 %   Neutro Abs 6.8 1.7 - 7.7 K/uL   Lymphocytes Relative 21 12 - 46 %   Lymphs Abs 2.1 0.7 - 4.0 K/uL   Monocytes Relative 8 3 - 12 %   Monocytes Absolute 0.8 0.1 - 1.0 K/uL   Eosinophils Relative 3 0 - 5 %   Eosinophils Absolute 0.3 0.0 - 0.7 K/uL   Basophils Relative 0 0 - 1 %   Basophils Absolute 0.0 0.0 - 0.1 K/uL  Basic metabolic panel     Status: Abnormal   Collection Time: 05/22/14  5:48 AM  Result Value Ref Range   Sodium 144 137 - 147 mEq/L   Potassium 3.8 3.7 - 5.3 mEq/L   Chloride 104 96 - 112 mEq/L   CO2 23 19 - 32 mEq/L   Glucose, Bld 131 (H) 70 - 99 mg/dL   BUN 22 6 - 23 mg/dL   Creatinine, Ser 1.24 0.50 - 1.35 mg/dL   Calcium 9.2 8.4 -  10.5 mg/dL   GFR calc non Af Amer 57 (L) >90 mL/min   GFR calc Af Amer 66 (L) >90  mL/min    Comment: (NOTE) The eGFR has been calculated using the CKD EPI equation. This calculation has not been validated in all clinical situations. eGFR's persistently <90 mL/min signify possible Chronic Kidney Disease.    Anion gap 17 (H) 5 - 15  APTT     Status: None   Collection Time: 05/22/14  5:48 AM  Result Value Ref Range   aPTT 26 24 - 37 seconds  Troponin I     Status: Abnormal   Collection Time: 05/22/14  5:48 AM  Result Value Ref Range   Troponin I 1.05 (HH) <0.30 ng/mL    Comment:        Due to the release kinetics of cTnI, a negative result within the first hours of the onset of symptoms does not rule out myocardial infarction with certainty. If myocardial infarction is still suspected, repeat the test at appropriate intervals. CRITICAL RESULT CALLED TO, READ BACK BY AND VERIFIED WITH: D.[email or web address] RN [id number] 05/22/14 E.GADDY   Protime-INR     Status: None   Collection Time: 05/22/14  5:48 AM  Result Value Ref Range   Prothrombin Time 12.4 11.6 - 15.2 seconds   INR 0.92 0.00 - 1.49  Magnesium     Status: None   Collection Time: 05/22/14  5:48 AM  Result Value Ref Range   Magnesium 1.7 1.5 - 2.5 mg/dL  Phosphorus     Status: Abnormal   Collection Time: 05/22/14  5:48 AM  Result Value Ref Range   Phosphorus 2.0 (L) 2.3 - 4.6 mg/dL    Dg Chest Port 1 View  05/22/2014   CLINICAL DATA:  Acute onset of sharp generalized chest pain. Initial encounter.  EXAM: PORTABLE CHEST - 1 VIEW  COMPARISON:  Chest radiograph performed 02/16/2014  FINDINGS: The lungs are well-aerated. Mild peribronchial thickening is noted. Mild right basilar opacity may reflect atelectasis or possibly pneumonia. There is no evidence of pleural effusion or pneumothorax.  The cardiomediastinal silhouette is borderline normal in size. No acute osseous abnormalities are seen.  IMPRESSION: Mild peribronchial thickening noted. Mild right basilar airspace opacity may reflect atelectasis or possibly  pneumonia.   Electronically Signed   By: Garald Balding [name]   On: 05/22/2014 06:44    Review of Systems  Respiratory: Positive for shortness of breath.   Cardiovascular: Positive for chest pain.   Blood pressure 145/64, pulse 85, temperature 98.9 F (37.2 C), temperature source Oral, resp. rate 24, height '5\' 9"'  (1.753 m), weight 265 lb (120.203 kg), SpO2 97 %. Physical Exam  Vitals reviewed. Constitutional: He is oriented to person, place, and time. He appears distressed (mildly).  obese  HENT:  Head: Normocephalic and atraumatic.  Eyes: Pupils are equal, round, and reactive to light.  Neck: Neck supple.  Well healed scar  Cardiovascular: Normal rate, regular rhythm and intact distal pulses.   No murmur heard. IABP in place right groin  Respiratory: Breath sounds normal. He has no wheezes.  GI: Soft. There is no tenderness.  Musculoskeletal: He exhibits no edema.  Neurological: He is alert and oriented to person, place, and time.  Skin: Skin is warm and dry.    Assessment/Plan: 73 yo man with multiple CRf presents with NQWMI in setting of a fib with RVR. At [place] has severe left main disease. Required IABP for ongoing CP. Hemodynamically stable.  Needs urgent  CABG for revascularization  I have discussed the general nature of the procedure, the need for general anesthesia, the use of cardiopulmonary bypass and the incisions to be used with Mr and Mrs Mohamed separately. Briefly reviewed the expected hospital stay, overall recovery and short and long term outcomes. They understand the risks include, but are not limited to death, stroke, MI, DVT/PE, bleeding, possible need for transfusion, infections, and other organ system dysfunction including respiratory, renal, or GI complications. They wish to proceed.  OR notified  Cylie Dor C 05/22/2014, 10:01 AM

## 2014-05-22 NOTE — OR Nursing (Signed)
2nd call SICU [phone]

## 2014-05-22 NOTE — Progress Notes (Signed)
Echocardiogram 2D Echocardiogram has been performed.  Caleb Wolf 05/22/2014, 11:43 AM

## 2014-05-22 NOTE — Progress Notes (Signed)
CT surgery p.m. Rounds  Status post emergency CABG with preop balloon pump Hemodynamic stable Patient intubated with FiO2 60% Minimal chest tube drainage Doppler pulses present and feet Plan to reduce balloon pump augmentation down early a.m.

## 2014-05-22 NOTE — ED Notes (Signed)
Cards MD in to see pt and family , pt will be going to cath lab this morning

## 2014-05-22 NOTE — Transfer of Care (Signed)
Immediate Anesthesia Transfer of Care Note  Patient: Caleb Wolf  Procedure(s) Performed: Procedure(s): CORONARY ARTERY BYPASS GRAFTING (CABG) times three using left internal mammary and right saphenous vein. (N/A)  Patient Location: SICU  Anesthesia Type:General  Level of Consciousness: sedated, responds to stimulation and Patient remains intubated per anesthesia plan  Airway & Oxygen Therapy: Patient remains intubated per anesthesia plan and Patient placed on Ventilator (see vital sign flow sheet for setting)  Post-op Assessment: Report given to PACU RN and Post -op Vital signs reviewed and stable  Post vital signs: Reviewed and stable  Complications: No apparent anesthesia complications

## 2014-05-22 NOTE — ED Notes (Signed)
Patient changed from nasal cannula to non rebreather

## 2014-05-22 NOTE — Anesthesia Postprocedure Evaluation (Signed)
  Anesthesia Post-op Note  Patient: Caleb Wolf  Procedure(s) Performed: Procedure(s): CORONARY ARTERY BYPASS GRAFTING (CABG) times three using left internal mammary and right saphenous vein. (N/A)  Patient Location: SICU  Anesthesia Type:General  Level of Consciousness: sedated and Patient remains intubated per anesthesia plan  Airway and Oxygen Therapy: Patient remains intubated per anesthesia plan and Patient placed on Ventilator (see vital sign flow sheet for setting)  Post-op Pain: none  Post-op Assessment: Post-op Vital signs reviewed, Patient's Cardiovascular Status Stable, Respiratory Function Stable, No signs of Nausea or vomiting and Pain level controlled  Post-op Vital Signs: Reviewed and stable  Last Vitals:  Filed Vitals:   05/22/14 0831  BP:   Pulse: 85  Temp:   Resp:     Complications: No apparent anesthesia complications

## 2014-05-22 NOTE — ED Notes (Signed)
Critical troponin of 1.05 called by the lab.  Read back and verified.  Md notified at this time as well as nurse.

## 2014-05-22 NOTE — Progress Notes (Signed)
  CARDIAC CATHETERIZATION APPROPRIATENESS OF USE  TIMI Score  Patient Information:  TIMI Score is 5  Revascularization of the presumed culprit artery  A (9)  Indication: 11; Score: 9 TIMI Score  Patient Information:  TIMI Score is 5  Revascularization of multiple coronary arteries when the culprit artery cannot clearly be determined  A (9)  Indication: 12; Score: 9   HARDING, Leonie Green, [name], M.S. Interventional Cardiologist   Pager # [phone]

## 2014-05-22 NOTE — Brief Op Note (Addendum)
05/22/2014  1:46 PM  PATIENT:  Caleb Wolf  73 y.o. male  PRE-OPERATIVE DIAGNOSIS:  Left main disease, NSTEMI in setting of atrial fibrillation with RVR  POST-OPERATIVE DIAGNOSIS:  Left main disease, NSTEMI in setting of atrial fibrillation with RVR  PROCEDURE:   EMERGENCY CORONARY ARTERY BYPASS GRAFTING x 3 (  LIMA to LAD  Sequential SVG to OM1 and OM2 ENDOSCOPIC VEIN HARVEST RIGHT THIGH  SURGEON: Melrose Nakayama, MD  ASSISTANT: Suzzanne Cloud, PA-C  ANESTHESIA:   general  PATIENT CONDITION:  ICU - intubated and hemodynamically stable.  PRE-OPERATIVE WEIGHT: 120 kg  XC= 59 min CPB= 98 min  LAD and OM1 good targets OM2 fair target

## 2014-05-22 NOTE — Interval H&P Note (Signed)
History and Physical Interval Note:  05/22/2014 8:31 AM  Caleb Wolf  has presented today for surgery, with the diagnosis of cp/urgent NSTEMI. Presented with Afib RVR - SSCP with + Troponin & Acute CHF (unspecified)  The various methods of treatment have been discussed with the patient and family. After consideration of risks, benefits and other options for treatment, the patient has consented to  Procedure(s): LEFT HEART CATHETERIZATION WITH CORONARY ANGIOGRAM (N/A) +/- PCI as a surgical intervention .  The patient's history has been reviewed, patient examined, no change in status, stable for surgery.  I have reviewed the patient's chart and labs.  Questions were answered to the patient's satisfaction.    Cath Lab Visit (complete for each Cath Lab visit)  Clinical Evaluation Leading to the Procedure:   ACS: Yes.    Non-ACS:    Anginal Classification: CCS IV  Anti-ischemic medical therapy: Maximal Therapy (2 or more classes of medications)  Non-Invasive Test Results: Low-risk stress test findings: cardiac mortality <1%/year  Prior CABG: No previous CABG  Caleb Wolf W

## 2014-05-23 ENCOUNTER — Encounter (HOSPITAL_COMMUNITY): Payer: Self-pay | Admitting: Thoracic Surgery (Cardiothoracic Vascular Surgery)

## 2014-05-23 ENCOUNTER — Inpatient Hospital Stay (HOSPITAL_COMMUNITY): Payer: Medicare Other

## 2014-05-23 DIAGNOSIS — I4891 Unspecified atrial fibrillation: Secondary | ICD-10-CM

## 2014-05-23 LAB — BASIC METABOLIC PANEL
Anion gap: 5 (ref 5–15)
BUN: 16 mg/dL (ref 6–23)
CO2: 27 mmol/L (ref 19–32)
CREATININE: 1.34 mg/dL (ref 0.50–1.35)
Calcium: 7.9 mg/dL — ABNORMAL LOW (ref 8.4–10.5)
Chloride: 108 mEq/L (ref 96–112)
GFR calc Af Amer: 60 mL/min — ABNORMAL LOW (ref >90)
GFR, EST NON AFRICAN AMERICAN: 52 mL/min — AB (ref >90)
Glucose, Bld: 128 mg/dL — ABNORMAL HIGH (ref 70–99)
Potassium: 4.3 mmol/L (ref 3.5–5.1)
Sodium: 140 mmol/L (ref 135–145)

## 2014-05-23 LAB — GLUCOSE, CAPILLARY
GLUCOSE-CAPILLARY: 114 mg/dL — AB (ref 70–99)
GLUCOSE-CAPILLARY: 108 mg/dL — AB (ref 70–99)
GLUCOSE-CAPILLARY: 103 mg/dL — AB (ref 70–99)
GLUCOSE-CAPILLARY: 112 mg/dL — AB (ref 70–99)
GLUCOSE-CAPILLARY: 104 mg/dL — AB (ref 70–99)
GLUCOSE-CAPILLARY: 93 mg/dL (ref 70–99)
GLUCOSE-CAPILLARY: 108 mg/dL — AB (ref 70–99)
GLUCOSE-CAPILLARY: 138 mg/dL — AB (ref 70–99)
Glucose-Capillary: 103 mg/dL — ABNORMAL HIGH (ref 70–99)
Glucose-Capillary: 106 mg/dL — ABNORMAL HIGH (ref 70–99)
Glucose-Capillary: 111 mg/dL — ABNORMAL HIGH (ref 70–99)
Glucose-Capillary: 112 mg/dL — ABNORMAL HIGH (ref 70–99)
Glucose-Capillary: 112 mg/dL — ABNORMAL HIGH (ref 70–99)
Glucose-Capillary: 113 mg/dL — ABNORMAL HIGH (ref 70–99)
Glucose-Capillary: 118 mg/dL — ABNORMAL HIGH (ref 70–99)
Glucose-Capillary: 123 mg/dL — ABNORMAL HIGH (ref 70–99)
Glucose-Capillary: 133 mg/dL — ABNORMAL HIGH (ref 70–99)
Glucose-Capillary: 134 mg/dL — ABNORMAL HIGH (ref 70–99)
Glucose-Capillary: 142 mg/dL — ABNORMAL HIGH (ref 70–99)
Glucose-Capillary: 88 mg/dL (ref 70–99)

## 2014-05-23 LAB — POCT I-STAT 3, ART BLOOD GAS (G3+)
Acid-base deficit: 3 mmol/L — ABNORMAL HIGH (ref 0.0–2.0)
Acid-base deficit: 3 mmol/L — ABNORMAL HIGH (ref 0.0–2.0)
Bicarbonate: 22.7 mEq/L (ref 20.0–24.0)
Bicarbonate: 22.7 mEq/L (ref 20.0–24.0)
O2 SAT: 96 %
O2 Saturation: 95 %
PCO2 ART: 43.1 mmHg (ref 35.0–45.0)
PCO2 ART: 41.3 mmHg (ref 35.0–45.0)
PH ART: 7.331 — AB (ref 7.350–7.450)
PH ART: 7.35 (ref 7.350–7.450)
PO2 ART: 91 mmHg (ref 80.0–100.0)
PO2 ART: 82 mmHg (ref 80.0–100.0)
Patient temperature: 37.4
Patient temperature: 37.4
TCO2: 24 mmol/L (ref 0–100)
TCO2: 24 mmol/L (ref 0–100)

## 2014-05-23 LAB — CBC
HCT: 38.5 % — ABNORMAL LOW (ref 39.0–52.0)
HEMATOCRIT: 38.8 % — AB (ref 39.0–52.0)
HEMATOCRIT: 40 % (ref 39.0–52.0)
HEMOGLOBIN: 13.5 g/dL (ref 13.0–17.0)
Hemoglobin: 12.5 g/dL — ABNORMAL LOW (ref 13.0–17.0)
Hemoglobin: 12.4 g/dL — ABNORMAL LOW (ref 13.0–17.0)
MCH: 29.8 pg (ref 26.0–34.0)
MCH: 31.8 pg (ref 26.0–34.0)
MCH: 30.6 pg (ref 26.0–34.0)
MCHC: 32.2 g/dL (ref 30.0–36.0)
MCHC: 33.8 g/dL (ref 30.0–36.0)
MCHC: 32.2 g/dL (ref 30.0–36.0)
MCV: 92.6 fL (ref 78.0–100.0)
MCV: 94.3 fL (ref 78.0–100.0)
MCV: 95.1 fL (ref 78.0–100.0)
PLATELETS: 165 10*3/uL (ref 150–400)
Platelets: 152 10*3/uL (ref 150–400)
Platelets: 163 10*3/uL (ref 150–400)
RBC: 4.19 MIL/uL — ABNORMAL LOW (ref 4.22–5.81)
RBC: 4.24 MIL/uL (ref 4.22–5.81)
RBC: 4.05 MIL/uL — ABNORMAL LOW (ref 4.22–5.81)
RDW: 13.4 % (ref 11.5–15.5)
RDW: 13.5 % (ref 11.5–15.5)
RDW: 13.7 % (ref 11.5–15.5)
WBC: 20.4 10*3/uL — ABNORMAL HIGH (ref 4.0–10.5)
WBC: 24.3 10*3/uL — ABNORMAL HIGH (ref 4.0–10.5)
WBC: 25.8 10*3/uL — AB (ref 4.0–10.5)

## 2014-05-23 LAB — MAGNESIUM
MAGNESIUM: 2 mg/dL (ref 1.5–2.5)
Magnesium: 2.4 mg/dL (ref 1.5–2.5)

## 2014-05-23 LAB — HEMOGLOBIN A1C
Hgb A1c MFr Bld: 5.8 % — ABNORMAL HIGH (ref <5.7)
Mean Plasma Glucose: 120 mg/dL — ABNORMAL HIGH (ref <117)

## 2014-05-23 LAB — CK TOTAL AND CKMB (NOT AT ARMC)
CK, MB: 257.8 ng/mL — AB (ref 0.3–4.0)
Relative Index: 11.6 — ABNORMAL HIGH (ref 0.0–2.5)
Total CK: 2231 U/L — ABNORMAL HIGH (ref 7–232)

## 2014-05-23 LAB — POCT I-STAT, CHEM 8
BUN: 20 mg/dL (ref 6–23)
CHLORIDE: 101 meq/L (ref 96–112)
Calcium, Ion: 1.09 mmol/L — ABNORMAL LOW (ref 1.13–1.30)
Creatinine, Ser: 1.4 mg/dL — ABNORMAL HIGH (ref 0.50–1.35)
Glucose, Bld: 166 mg/dL — ABNORMAL HIGH (ref 70–99)
HEMATOCRIT: 40 % (ref 39.0–52.0)
HEMOGLOBIN: 13.6 g/dL (ref 13.0–17.0)
Potassium: 3.9 mmol/L (ref 3.5–5.1)
SODIUM: 141 mmol/L (ref 135–145)
TCO2: 23 mmol/L (ref 0–100)

## 2014-05-23 LAB — POCT ACTIVATED CLOTTING TIME: Activated Clotting Time: 110 seconds

## 2014-05-23 LAB — CREATININE, SERUM
CREATININE: 1.39 mg/dL — AB (ref 0.50–1.35)
Creatinine, Ser: 1.52 mg/dL — ABNORMAL HIGH (ref 0.50–1.35)
GFR calc Af Amer: 57 mL/min — ABNORMAL LOW (ref >90)
GFR, EST AFRICAN AMERICAN: 51 mL/min — AB (ref >90)
GFR, EST NON AFRICAN AMERICAN: 49 mL/min — AB (ref >90)
GFR, EST NON AFRICAN AMERICAN: 44 mL/min — AB (ref >90)

## 2014-05-23 MED ORDER — AMIODARONE HCL IN DEXTROSE 360-4.14 MG/200ML-% IV SOLN
30.0000 mg/h | INTRAVENOUS | Status: AC
  Administered 2014-05-23 – 2014-05-25 (×6): 30 mg/h via INTRAVENOUS
  Filled 2014-05-23 (×13): qty 200

## 2014-05-23 MED ORDER — FUROSEMIDE 10 MG/ML IJ SOLN
40.0000 mg | Freq: Once | INTRAMUSCULAR | Status: AC
  Administered 2014-05-23: 40 mg via INTRAVENOUS

## 2014-05-23 MED ORDER — CETYLPYRIDINIUM CHLORIDE 0.05 % MT LIQD
7.0000 mL | Freq: Two times a day (BID) | OROMUCOSAL | Status: DC
  Administered 2014-05-23 – 2014-05-25 (×6): 7 mL via OROMUCOSAL

## 2014-05-23 MED ORDER — INSULIN ASPART 100 UNIT/ML ~~LOC~~ SOLN
0.0000 [IU] | SUBCUTANEOUS | Status: DC
  Administered 2014-05-23 – 2014-05-24 (×3): 2 [IU] via SUBCUTANEOUS

## 2014-05-23 MED ORDER — CHLORHEXIDINE GLUCONATE 0.12 % MT SOLN
15.0000 mL | Freq: Two times a day (BID) | OROMUCOSAL | Status: DC
  Administered 2014-05-23 – 2014-05-25 (×4): 15 mL via OROMUCOSAL
  Filled 2014-05-23 (×3): qty 15

## 2014-05-23 MED ORDER — INSULIN DETEMIR 100 UNIT/ML ~~LOC~~ SOLN: 25.0000 [IU] | Freq: Two times a day (BID) | SUBCUTANEOUS | Status: DC

## 2014-05-23 MED ORDER — METOPROLOL TARTRATE 25 MG PO TABS
25.0000 mg | ORAL_TABLET | Freq: Two times a day (BID) | ORAL | Status: DC
Start: 2014-05-23 — End: 2014-05-30
  Administered 2014-05-23 – 2014-05-30 (×15): 25 mg via ORAL
  Filled 2014-05-23 (×18): qty 1

## 2014-05-23 MED ORDER — INSULIN DETEMIR 100 UNIT/ML ~~LOC~~ SOLN
25.0000 [IU] | Freq: Two times a day (BID) | SUBCUTANEOUS | Status: DC
  Administered 2014-05-23 (×2): 25 [IU] via SUBCUTANEOUS
  Filled 2014-05-23 (×4): qty 0.25

## 2014-05-23 MED ORDER — METOCLOPRAMIDE HCL 5 MG/ML IJ SOLN
10.0000 mg | Freq: Four times a day (QID) | INTRAMUSCULAR | Status: AC
  Administered 2014-05-23 – 2014-05-26 (×12): 10 mg via INTRAVENOUS
  Filled 2014-05-23 (×11): qty 2

## 2014-05-23 MED ORDER — DOXAZOSIN MESYLATE 2 MG PO TABS
2.0000 mg | ORAL_TABLET | Freq: Every day | ORAL | Status: DC
  Administered 2014-05-23 – 2014-05-30 (×8): 2 mg via ORAL
  Filled 2014-05-23 (×9): qty 1

## 2014-05-23 MED ORDER — METOPROLOL TARTRATE 25 MG/10 ML ORAL SUSPENSION
25.0000 mg | Freq: Two times a day (BID) | ORAL | Status: DC
  Filled 2014-05-23 (×16): qty 10

## 2014-05-23 MED ORDER — ENOXAPARIN SODIUM 40 MG/0.4ML ~~LOC~~ SOLN
40.0000 mg | Freq: Every day | SUBCUTANEOUS | Status: DC
  Administered 2014-05-23 – 2014-05-29 (×7): 40 mg via SUBCUTANEOUS
  Filled 2014-05-23 (×9): qty 0.4

## 2014-05-23 MED FILL — Magnesium Sulfate Inj 50%: INTRAMUSCULAR | Qty: 10 | Status: AC

## 2014-05-23 MED FILL — Electrolyte-R (PH 7.4) Solution: INTRAVENOUS | Qty: 3000 | Status: AC

## 2014-05-23 MED FILL — Cefuroxime Sodium For Inj 750 MG: INTRAMUSCULAR | Qty: 750 | Status: AC

## 2014-05-23 MED FILL — Heparin Sodium (Porcine) Inj 1000 Unit/ML: INTRAMUSCULAR | Qty: 2500 | Status: AC

## 2014-05-23 MED FILL — Lidocaine HCl IV Inj 20 MG/ML: INTRAVENOUS | Qty: 5 | Status: AC

## 2014-05-23 MED FILL — Potassium Chloride Inj 2 mEq/ML: INTRAVENOUS | Qty: 40 | Status: AC

## 2014-05-23 MED FILL — Sodium Chloride IV Soln 0.9%: INTRAVENOUS | Qty: 2000 | Status: AC

## 2014-05-23 MED FILL — Mannitol IV Soln 20%: INTRAVENOUS | Qty: 500 | Status: AC

## 2014-05-23 MED FILL — Heparin Sodium (Porcine) Inj 1000 Unit/ML: INTRAMUSCULAR | Qty: 10 | Status: AC

## 2014-05-23 MED FILL — Heparin Sodium (Porcine) Inj 1000 Unit/ML: INTRAMUSCULAR | Qty: 30 | Status: AC

## 2014-05-23 NOTE — Progress Notes (Signed)
POD # 1 emergency CABG  BP 128/57 mmHg  Pulse 115  Temp(Src) 98.8 F (37.1 C) (Oral)  Resp 15  Ht 5\' 9"  (1.753 m)  Wt 265 lb (120.203 kg)  BMI 39.12 kg/m2  SpO2 97%   Intake/Output Summary (Last 24 hours) at 05/23/14 1858 Last data filed at 05/23/14 1806  Gross per 24 hour  Intake 2147.17 ml  Output   3300 ml  Net -1152.83 ml    Stable day. Biggest complaint is nausea. Mildly distended. Will add reglan

## 2014-05-23 NOTE — Progress Notes (Signed)
Unable to weigh pt as bed was not zeroed prior to pt being placed on it.  Cannot take a standing weight as IABP remains.

## 2014-05-23 NOTE — Procedures (Signed)
Extubation Procedure Note  Patient Details:   Name: Caleb Wolf DOB: 1941/02/27 MRN: [id number]   Airway Documentation:     Evaluation  O2 sats: stable throughout Complications: No apparent complications Patient did tolerate procedure well. Bilateral Breath Sounds: Diminished (coarse) Suctioning: Oral, Airway Yes pt able to speak in whispered voice.   NIF -60 VC 1L Positive for cuff leak  Pt extubated to 4L [place], sats 100%, no stridor noted at this time.   Janann August 05/23/2014, 2:20 AM

## 2014-05-23 NOTE — Progress Notes (Signed)
1 Day Post-Op Procedure(s) (LRB): CORONARY ARTERY BYPASS GRAFTING (CABG) times three using left internal mammary and right saphenous vein. (N/A) Subjective: C/o incisional pain  Objective: Vital signs in last 24 hours: Temp:  [96.6 F (35.9 C)-99.3 F (37.4 C)] 99.3 F (37.4 C) (12/22 0800) Pulse Rate:  [52-109] 101 (12/22 0800) Cardiac Rhythm:  [-] Sinus tachycardia (12/22 0800) Resp:  [11-31] 21 (12/22 0800) BP: (95-151)/(35-108) 151/52 mmHg (12/22 0800) SpO2:  [87 %-100 %] 96 % (12/22 0800) Arterial Line BP: (56)/(49) 56/49 mmHg (12/21 1615) FiO2 (%):  [40 %-60 %] 40 % (12/22 0059)  Hemodynamic parameters for last 24 hours: PAP: (30-54)/(4-30) 54/26 mmHg CO:  [3.5 L/min-8.9 L/min] 5.4 L/min CI:  [1.5 L/min/m2-3.8 L/min/m2] 2.3 L/min/m2  Intake/Output from previous day: 12/21 0701 - 12/22 0700 In: 7507.3 [I.V.:5762.3; Blood:865; NG/GT:80; IV Piggyback:800] Out: 4710 [Urine:3085; Blood:1365; Chest Tube:260] Intake/Output this shift:    General appearance: alert and no distress Neurologic: intact Heart: regular rate and rhythm Lungs: diminished breath sounds bibasilar Extremities: well perfused  Lab Results:  Recent Labs  05/22/14 2145 05/22/14 2148 05/23/14 0400  WBC 20.1*  --  20.4*  HGB 12.6* 13.3 12.5*  HCT 39.1 39.0 38.8*  PLT 151  --  152   BMET:  Recent Labs  05/22/14 0939  05/22/14 2148 05/23/14 0400  NA 141  < > 142 140  K 4.0  < > 3.9 4.3  CL 104  < > 104 108  CO2 22  --   --  27  GLUCOSE 157*  < > 111* 128*  BUN 20  < > 17 16  CREATININE 1.11  < > 1.20 1.34  CALCIUM 8.6  --   --  7.9*  < > = values in this interval not displayed.  PT/INR:  Recent Labs  05/22/14 1500  LABPROT 15.9*  INR 1.26   ABG    Component Value Date/Time   PHART 7.350 05/23/2014 0339   HCO3 22.7 05/23/2014 0339   TCO2 24 05/23/2014 0339   ACIDBASEDEF 3.0* 05/23/2014 0339   O2SAT 95.0 05/23/2014 0339   CBG (last 3)   Recent Labs  05/23/14 0033  05/23/14 0143 05/23/14 0304  GLUCAP 134* 112* 111*    Assessment/Plan: S/P Procedure(s) (LRB): CORONARY ARTERY BYPASS GRAFTING (CABG) times three using left internal mammary and right saphenous vein. (N/A) POD # 1 emergency CABG  CV- good hemodynamics. Ci 2.3 on IABP at 1:3- dc IABP  Dc swan  Wean dopamine  On amiodarone for a fib  ASA, beta blocker, statin  RESP - IS for basilar atelectasis  RENAL- creatinine is up a little this AM- follow  Diurese  ENDO- CBG well controlled with insulin gtt  Transition to levimir + SSI  Leukocytosis- likely secondary to MI  DC CT  OOB after IABP out   LOS: 1 day    Caleb Wolf C 05/23/2014

## 2014-05-23 NOTE — Op Note (Signed)
NAMEDASHAN, CHIZMAR NO.:  [id number]  MEDICAL RECORD NO.:  [id number]  LOCATION:                                 FACILITY:  PHYSICIAN:  Revonda Standard. 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  PROCEDURE:   Emergency coronary artery bypass grafting x3  Left internal mammary artery to left anterior descending artery,  Sequential saphenous vein graft to obtuse marginal 1 and 2 Endoscopic vein harvest right thigh  SURGEON:  Remo Lipps C. Roxan Hockey, [name]  ASSISTANT:  Suzzanne Cloud, P.A.  ANESTHESIA:  General.  FINDINGS:  Transesophageal echocardiography showed septal hypokinesis but otherwise preserved left ventricular wall motion.  Post bypass transesophageal echocardiography showed improved septal wall motion.  LAD and OM1 good quality targets, OM2 fair quality, first diagonal too small to graft. Good quality conduits.  CLINICAL NOTE:  Mr. Mole is a 72 year old gentleman with multiple cardiac risk factors who presented with chest pain.  He was in rapid atrial fibrillation and had positive troponins consistent with non-ST elevation MI.  He was taken emergently to the cardiac catheterization lab, where he was found to have an 80% left main stenosis with a 95%+ proximal LAD stenosis.  There also was ostial circumflex Disease, as well as disease at the ostium of the first obtuse marginal. It was a left dominant circulation.  An intra-aortic balloon pump was placed because of ongoing chest pain.  The pain did resolve after placement of balloon pump.  The patient was advised to undergo emergency coronary bypass grafting.  The indications, risks, benefits,  and alternatives were discussed in detail with the patient.  He understood and accepted the risks and agreed to proceed.  OPERATIVE NOTE:  Mr. Carias was brought to the operating room on May 22, 2014.  There Dr. Annye Asa placed an arterial blood pressure monitoring line and a Swan-Ganz catheter.  The patient's PA pressures were markedly elevated at 60/40.  He was anesthetized and intubated. Intravenous antibiotics were administered.  Transesophageal echocardiography was performed.  There was septal hypokinesis but the remainder of the left ventricle appeared normal.  There was moderate tricuspid regurgitation and trace mitral regurgitation.  The chest, abdomen, and legs were prepped and draped in usual sterile fashion.  A median sternotomy was performed and the left internal mammary artery was harvested using a standard technique.  This was difficult due the patient's body habitus.  Simultaneously, an incision was made in the medial aspect of the right leg at the level of the knee.  The greater saphenous vein was identified and was harvested endoscopically from the upper calf to the groin, it was a good quality vessel as was the mammary artery. 2000 units of heparin was administered during the vessel harvest and the remainder of the full heparin dose was given prior to opening the pericardium.  After harvesting the conduit, the pericardium was opened.  The ascending aorta was  inspected.  There was no evidence of atherosclerotic disease. The aorta was cannulated via concentric 2-0 Ethibond pledgeted pursestring sutures.  A dual-stage venous cannula was placed via a pursestring suture in the right atrial appendage.  Cardiopulmonary bypass was instituted, and the patient was cooled to 32 degrees Celsius. The coronary arteries were inspected and anastomotic sites were chosen. The conduits were inspected and cut to length.  A foam pad was placed in the pericardium to insulate  the heart.  A temperature probe was placed in myocardial septum.  Retrograde cardioplegia cannula was placed via pursestring suture in the right atrium and directed into the coronary sinus and antegrade cardioplegia cannula was placed in the ascending aorta.  The aorta was crossclamped.  The left ventricle was emptied via the aortic root vent.  Cardiac arrest was then was achieved with a combination of cold antegrade and retrograde blood cardioplegia and topical iced saline.  An initial 500 mL of cardioplegia was given antegrade.  There was a rapid diastolic arrest. An additional 500 mL was given retrograde and there was septal cooling to 10 degrees Celsius.  The heart was elevated exposing the circumflex branches.  A side-to-side anastomosis was performed to the first obtuse marginal.  This was a 2 mm good quality target.  The anastomosis was performed with a running 7-0 Prolene suture.  A probe was passed easily proximally and distally. The distal end of the vein then was cut to length and anastomosed end-to- side to OM2.  This was the larger of the posterior lateral branches.  It did accept an 1.5 mm probe. It was only a fair quality vessel, very thin-walled. The vein was anastomosed end-to-side with a running 7-0 Prolene suture.  Again a probe did pass proximally and distally at the completion of the anastomosis.  Cardioplegia was administered down the graft and there was good flow through the graft and good hemostasis at both anastomoses. Additional cardioplegia was also administered via the retrograde cannula.  The left internal mammary artery was brought through a window in the pericardium.  The distal end was beveled.  It was a 2 mm good quality conduit and it was anastomosed end-to-side to the LAD, which was a 2 mm good quality target.  The end-to-side anastomosis was performed with a running 8-0 Prolene suture.  At the completion anastomosis, the bulldog clamp was briefly  removed, rapid septal rewarming was noted.  The bulldog clamp was replaced and the mammary pedicle was tacked to the epicardial surface of the heart with 6-0 Prolene sutures.  The vein graft was cut to length.  The cardioplegia cannula was removed from the ascending aorta.  The proximal vein graft anastomosis was then was performed to a 4.5 mm punch aortotomy with a running 6-0 Prolene suture.  At the completion of the proximal anastomosis, the patient was placed in Trendelenburg position.  Lidocaine was administered.  The bulldog clamp was removed from the left mammary artery.  The aortic root was de-aired and the aortic crossclamp was removed.  The total crossclamp time was 59 minutes.  The patient did fibrillate and required single defibrillation with 10 joules and was in heart block after that.  While rewarming was completed, all proximal and distal anastomoses were inspected for hemostasis. Epicardial pacing wires were placed on the right ventricle and right atrium.  DDD pacing was initiated.  When the patient had rewarmed to 37 degrees Celsius, he was weaned from cardiopulmonary bypass on the first attempt.  The total bypass  time was 98 minutes.  Initial cardiac index was only 1.5 liters/minute/sq m. With volume resuscitation and addition of a low-dose dopamine infusion, over time, the cardiac index improved to greater than 3 liters/minute/sq m. The patient was on dopamine and intra-aortic balloon pump at one-to-one at the time of separation from bypass.  He did have issues with peripheral vasodilatation initially, diltiazem which had been started for atrial fibrillation was stopped and his blood pressure issues improved after that.  A test dose of protamine was administered and was well tolerated.  The atrial and aortic cannulae were removed.  The remainder of protamine was administered without incident.  The chest was irrigated with warm saline.  Hemostasis was achieved.  The  pericardium was reapproximated over the ascending aorta with interrupted 3-0 silk sutures.  The left pleural and mediastinal chest tubes were placed through separate subcostal incisions.  The sternum was closed with a combination of single and double heavy gauge stainless steel wires.  The was no hemodynamic change with chest closure.  The pectoralis fascia, subcutaneous tissue, and skin were closed in standard fashion. All sponge, needle, and instrument counts were correct at the end of the procedure.  The patient was taken from the operating room to the surgical intensive care unit in good condition.     Revonda Standard Roxan Hockey, [name]     SCH/MEDQ  D:  05/22/2014  T:  05/23/2014  Job:  [id number]

## 2014-05-23 NOTE — Progress Notes (Signed)
Utilization Review Completed.  

## 2014-05-23 NOTE — Clinical Documentation Improvement (Signed)
Presents with NSTEMI, 3v CAD; C3L performed with IABP insertion.   "Presented with Afib RVR - SSCP with + Troponin & Acute CHF (unspecified)" documented by Dr. Ellyn Hack in interval H&P on 05/22/14  ECHO notes EF of 40-45% with global hypokinesis; more pronounced in septal myocardium  CXR reveals "Central mild vascular congestion without convincing pulmonary edema"  Please clarify if CHF ruled in or ruled out prior to procedure and if ruled in, please clarify the type and acuity of. Document findings in next progress note and include in discharge summary if applicable.   Acute, chronic, acute on chronic  Systolic, diastolic, systolic and diastolic  Acute pulmonary edema unrelated to CHF  Other condition  Thank You, Zoila Shutter ,RN Clinical Documentation Specialist:  [facility] [employer]

## 2014-05-23 NOTE — Progress Notes (Signed)
SICU rapid wean protocol initiated at this time. 

## 2014-05-23 NOTE — Progress Notes (Signed)
Per Dr. Roxan Hockey, 5mg  IV Lopressor given for HR and BP.  Will administer and monitor closely.

## 2014-05-23 NOTE — Progress Notes (Signed)
Order for IABP/sheath removal verified per post procedural orders. Procedure explained to patient and Rt femoral artery access site assessed: level 0, dopler dorsalis pedis. IABP and Sheath removed together and manual pressure applied for 40 minutes. Pre, peri, & post procedural vitals: HR 108, RR 18, O2 Sat upper 97, BP 145/60-125/50, Pain 0. Distal pulses remained intact after sheath removal. Access site level 0 and dressed with 4X4 gauze and tegaderm.  Sharrie Rothman, RN confirmed condition of site. Post procedural instructions discussed with return demonstration from patient.

## 2014-05-23 NOTE — Plan of Care (Signed)
Problem: Phase II - Intermediate Post-Op Goal: Wean to Extubate Outcome: Completed/Met Date Met:  05/23/14 Pt extubated in >6 hours Goal: Maintain Hemodynamic Stability Outcome: Not Progressing Pt remains on Dopamine and Amio Goal: CBGs/Blood Glucose per SCIP Criteria Outcome: Not Progressing Pt remains on glucostablizer Goal: Advance Diet Outcome: Not Progressing Pt vomited x2 Goal: Activity Progressed Outcome: Not Progressing IABP remains in place.  Unable to dangle/stand.

## 2014-05-24 ENCOUNTER — Inpatient Hospital Stay (HOSPITAL_COMMUNITY): Payer: Medicare Other

## 2014-05-24 LAB — BASIC METABOLIC PANEL
Anion gap: 8 (ref 5–15)
BUN: 21 mg/dL (ref 6–23)
CHLORIDE: 104 meq/L (ref 96–112)
CO2: 28 mmol/L (ref 19–32)
CREATININE: 1.45 mg/dL — AB (ref 0.50–1.35)
Calcium: 7.9 mg/dL — ABNORMAL LOW (ref 8.4–10.5)
GFR calc Af Amer: 54 mL/min — ABNORMAL LOW (ref >90)
GFR calc non Af Amer: 47 mL/min — ABNORMAL LOW (ref >90)
Glucose, Bld: 142 mg/dL — ABNORMAL HIGH (ref 70–99)
POTASSIUM: 4.3 mmol/L (ref 3.5–5.1)
Sodium: 140 mmol/L (ref 135–145)

## 2014-05-24 LAB — GLUCOSE, CAPILLARY
GLUCOSE-CAPILLARY: 136 mg/dL — AB (ref 70–99)
GLUCOSE-CAPILLARY: 166 mg/dL — AB (ref 70–99)
Glucose-Capillary: 107 mg/dL — ABNORMAL HIGH (ref 70–99)
Glucose-Capillary: 114 mg/dL — ABNORMAL HIGH (ref 70–99)
Glucose-Capillary: 121 mg/dL — ABNORMAL HIGH (ref 70–99)
Glucose-Capillary: 134 mg/dL — ABNORMAL HIGH (ref 70–99)

## 2014-05-24 LAB — CBC
HCT: 34.5 % — ABNORMAL LOW (ref 39.0–52.0)
HEMOGLOBIN: 10.8 g/dL — AB (ref 13.0–17.0)
MCH: 29.5 pg (ref 26.0–34.0)
MCHC: 31.3 g/dL (ref 30.0–36.0)
MCV: 94.3 fL (ref 78.0–100.0)
Platelets: 130 10*3/uL — ABNORMAL LOW (ref 150–400)
RBC: 3.66 MIL/uL — AB (ref 4.22–5.81)
RDW: 13.7 % (ref 11.5–15.5)
WBC: 20.8 10*3/uL — ABNORMAL HIGH (ref 4.0–10.5)

## 2014-05-24 LAB — BLOOD PRODUCT ORDER (VERBAL) VERIFICATION

## 2014-05-24 MED ORDER — FUROSEMIDE 40 MG PO TABS
40.0000 mg | ORAL_TABLET | Freq: Every day | ORAL | Status: DC
  Administered 2014-05-25 – 2014-05-30 (×6): 40 mg via ORAL
  Filled 2014-05-24 (×6): qty 1

## 2014-05-24 MED ORDER — AMIODARONE LOAD VIA INFUSION
150.0000 mg | Freq: Once | INTRAVENOUS | Status: AC
  Administered 2014-05-24: 150 mg via INTRAVENOUS

## 2014-05-24 MED ORDER — FUROSEMIDE 10 MG/ML IJ SOLN
40.0000 mg | Freq: Once | INTRAMUSCULAR | Status: AC
  Administered 2014-05-24: 40 mg via INTRAVENOUS
  Filled 2014-05-24: qty 4

## 2014-05-24 MED ORDER — INSULIN ASPART 100 UNIT/ML ~~LOC~~ SOLN
0.0000 [IU] | Freq: Three times a day (TID) | SUBCUTANEOUS | Status: DC
  Administered 2014-05-24: 3 [IU] via SUBCUTANEOUS
  Administered 2014-05-25 – 2014-05-27 (×6): 2 [IU] via SUBCUTANEOUS

## 2014-05-24 MED ORDER — DILTIAZEM HCL 100 MG IV SOLR
5.0000 mg/h | INTRAVENOUS | Status: DC
  Administered 2014-05-24: 10 mg/h via INTRAVENOUS
  Administered 2014-05-24: 5 mg/h via INTRAVENOUS
  Administered 2014-05-24: 15 mg/h via INTRAVENOUS
  Administered 2014-05-24: 12.5 mg/h via INTRAVENOUS
  Administered 2014-05-25: 15 mg/h via INTRAVENOUS
  Filled 2014-05-24: qty 100

## 2014-05-24 MED ORDER — INSULIN DETEMIR 100 UNIT/ML ~~LOC~~ SOLN
20.0000 [IU] | Freq: Two times a day (BID) | SUBCUTANEOUS | Status: DC
  Administered 2014-05-24 – 2014-05-28 (×9): 20 [IU] via SUBCUTANEOUS
  Filled 2014-05-24 (×13): qty 0.2

## 2014-05-24 NOTE — Progress Notes (Signed)
Inpatient Diabetes Program Recommendations  AACE/ADA: New Consensus Statement on Inpatient Glycemic Control (2013)  Target Ranges:  Prepandial:   less than 140 mg/dL      Peak postprandial:   less than 180 mg/dL (1-2 hours)      Critically ill patients:  140 - 180 mg/dL   Reason for Assessment:  CBG's well controlled.  Note that patient has no history of diabetes, with A1C=6.1%.  Will not need insulin at discharge.  Continue to titrate insulin down as patient progresses.    Thanks, Adah Perl, RN, BC-ADM Inpatient Diabetes Coordinator Pager [phone]

## 2014-05-24 NOTE — Progress Notes (Signed)
Attempted to pull labs from R IJ sleeve.  No blood returned, only air.  Cordis removed per protocol.  Catheter intact upon removal, large kink noted.  Pt tolerated well.  Pressure dressing with Vaseline gauze placed.  Will monitor closely for bleeding.  Lab notified pt now a lab draw.

## 2014-05-24 NOTE — Progress Notes (Signed)
CT surgery p.m. Rounds  Status post emergency CABG Hemodynamically stable but currently in rapid atrial fibrillation heart rate greater than 120 despite IV amiodarone with extra bolus We'll start IV Cardizem drip Lab work satisfactory Oxygen saturation 95%

## 2014-05-24 NOTE — Progress Notes (Signed)
2 Days Post-Op Procedure(s) (LRB): CORONARY ARTERY BYPASS GRAFTING (CABG) times three using left internal mammary and right saphenous vein. (N/A) Subjective: Nausea better and has a small amount of flatus but still belches occassionally  Objective: Vital signs in last 24 hours: Temp:  [98.1 F (36.7 C)-99.3 F (37.4 C)] 98.1 F (36.7 C) (12/23 0736) Pulse Rate:  [51-125] 79 (12/23 0800) Cardiac Rhythm:  [-] Normal sinus rhythm (12/23 0800) Resp:  [10-28] 12 (12/23 0800) BP: (99-155)/(47-87) 118/54 mmHg (12/23 0800) SpO2:  [81 %-99 %] 91 % (12/23 0800) Weight:  [267 lb 3.2 oz (121.2 kg)] 267 lb 3.2 oz (121.2 kg) (12/23 0500)  Hemodynamic parameters for last 24 hours: PAP: (40-49)/(14-26) 49/26 mmHg  Intake/Output from previous day: 12/22 0701 - 12/23 0700 In: 1407.9 [P.O.:300; I.V.:1007.9; IV Piggyback:100] Out: 6160 [Urine:2400; Chest Tube:150] Intake/Output this shift: Total I/O In: 16.7 [I.V.:16.7] Out: 125 [Urine:125]  General appearance: alert and no distress Neurologic: intact Heart: regular rate and rhythm Lungs: diminished breath sounds bibasilar Abdomen: distended, + BS, nontender  Lab Results:  Recent Labs  05/23/14 1825 05/24/14 0530  WBC 25.8* 20.8*  HGB 12.4* 10.8*  HCT 38.5* 34.5*  PLT 165 130*   BMET:  Recent Labs  05/23/14 0400  05/23/14 1814 05/23/14 1825 05/24/14 0530  NA 140  --  141  --  140  K 4.3  --  3.9  --  4.3  CL 108  --  101  --  104  CO2 27  --   --   --  28  GLUCOSE 128*  --  166*  --  142*  BUN 16  --  20  --  21  CREATININE 1.34  < > 1.40* 1.52* 1.45*  CALCIUM 7.9*  --   --   --  7.9*  < > = values in this interval not displayed.  PT/INR:  Recent Labs  05/22/14 1500  LABPROT 15.9*  INR 1.26   ABG    Component Value Date/Time   PHART 7.350 05/23/2014 0339   HCO3 22.7 05/23/2014 0339   TCO2 23 05/23/2014 1814   ACIDBASEDEF 3.0* 05/23/2014 0339   O2SAT 95.0 05/23/2014 0339   CBG (last 3)   Recent Labs  05/23/14 2351 05/24/14 0331 05/24/14 0732  GLUCAP 114* 134* 136*    Assessment/Plan: S/P Procedure(s) (LRB): CORONARY ARTERY BYPASS GRAFTING (CABG) times three using left internal mammary and right saphenous vein. (N/A) POD # 2 CABG x 3  CV- maintaining SR on lopressor and amiodarone- continue amiodarone IV until more definite return of bowel function as he presented with a fib with RVR Beta blocker, statin, ASA for CAD, restart lisinopril prior to dc  RESP- bibasilar atelectasis- IS  RENAL- creatinine appears to have plateaued. Continue diuresis  GI- ileus- clinically improved but not yet resolved, continue reglan another 24 hours  ENDO- A1C ok, CBG well controlled, change to AC/HS  SCD + enoxaparin for DVT prophylaxis  Transfer to [facility] when bed available   LOS: 2 days    Caleb Wolf C 05/24/2014

## 2014-05-25 ENCOUNTER — Inpatient Hospital Stay (HOSPITAL_COMMUNITY): Payer: Medicare Other

## 2014-05-25 LAB — LIPID PANEL
CHOL/HDL RATIO: 3.1 ratio
Cholesterol: 116 mg/dL (ref 0–200)
HDL: 38 mg/dL — AB (ref >39)
LDL CALC: 55 mg/dL (ref 0–99)
TRIGLYCERIDES: 113 mg/dL (ref <150)
VLDL: 23 mg/dL (ref 0–40)

## 2014-05-25 LAB — BASIC METABOLIC PANEL
Anion gap: 7 (ref 5–15)
BUN: 21 mg/dL (ref 6–23)
CHLORIDE: 99 meq/L (ref 96–112)
CO2: 30 mmol/L (ref 19–32)
Calcium: 7.9 mg/dL — ABNORMAL LOW (ref 8.4–10.5)
Creatinine, Ser: 1.22 mg/dL (ref 0.50–1.35)
GFR calc non Af Amer: 58 mL/min — ABNORMAL LOW (ref >90)
GFR, EST AFRICAN AMERICAN: 67 mL/min — AB (ref >90)
GLUCOSE: 145 mg/dL — AB (ref 70–99)
Potassium: 3.6 mmol/L (ref 3.5–5.1)
Sodium: 136 mmol/L (ref 135–145)

## 2014-05-25 LAB — GLUCOSE, CAPILLARY
Glucose-Capillary: 127 mg/dL — ABNORMAL HIGH (ref 70–99)
Glucose-Capillary: 126 mg/dL — ABNORMAL HIGH (ref 70–99)
Glucose-Capillary: 148 mg/dL — ABNORMAL HIGH (ref 70–99)
Glucose-Capillary: 90 mg/dL (ref 70–99)

## 2014-05-25 LAB — CBC
HCT: 33.5 % — ABNORMAL LOW (ref 39.0–52.0)
HEMOGLOBIN: 10.5 g/dL — AB (ref 13.0–17.0)
MCH: 29.2 pg (ref 26.0–34.0)
MCHC: 31.3 g/dL (ref 30.0–36.0)
MCV: 93.3 fL (ref 78.0–100.0)
Platelets: 122 10*3/uL — ABNORMAL LOW (ref 150–400)
RBC: 3.59 MIL/uL — ABNORMAL LOW (ref 4.22–5.81)
RDW: 13.5 % (ref 11.5–15.5)
WBC: 16 10*3/uL — AB (ref 4.0–10.5)

## 2014-05-25 MED ORDER — POTASSIUM CHLORIDE CRYS ER 20 MEQ PO TBCR
40.0000 meq | EXTENDED_RELEASE_TABLET | Freq: Two times a day (BID) | ORAL | Status: DC
  Administered 2014-05-25 – 2014-05-30 (×11): 40 meq via ORAL
  Filled 2014-05-25 (×13): qty 2

## 2014-05-25 MED ORDER — DIGOXIN 0.25 MG/ML IJ SOLN
0.2500 mg | Freq: Once | INTRAMUSCULAR | Status: AC
  Administered 2014-05-25: 0.25 mg via INTRAVENOUS
  Filled 2014-05-25: qty 1

## 2014-05-25 MED ORDER — SODIUM CHLORIDE 0.9 % IJ SOLN
3.0000 mL | Freq: Two times a day (BID) | INTRAMUSCULAR | Status: DC
  Administered 2014-05-25 – 2014-05-30 (×6): 3 mL via INTRAVENOUS

## 2014-05-25 MED ORDER — DIGOXIN 125 MCG PO TABS
0.1250 mg | ORAL_TABLET | Freq: Every day | ORAL | Status: DC
  Administered 2014-05-25 – 2014-05-30 (×6): 0.125 mg via ORAL
  Filled 2014-05-25 (×6): qty 1

## 2014-05-25 MED ORDER — SODIUM CHLORIDE 0.9 % IJ SOLN
3.0000 mL | INTRAMUSCULAR | Status: DC | PRN
  Administered 2014-05-26: 3 mL via INTRAVENOUS
  Filled 2014-05-25: qty 3

## 2014-05-25 MED ORDER — DIGOXIN 0.25 MG/ML IJ SOLN
0.5000 mg | Freq: Once | INTRAMUSCULAR | Status: AC
  Administered 2014-05-25: 0.5 mg via INTRAVENOUS
  Filled 2014-05-25: qty 2

## 2014-05-25 MED ORDER — ASPIRIN 81 MG PO CHEW
81.0000 mg | CHEWABLE_TABLET | Freq: Every day | ORAL | Status: DC
  Administered 2014-05-30: 81 mg
  Filled 2014-05-25: qty 1

## 2014-05-25 MED ORDER — WARFARIN - PHYSICIAN DOSING INPATIENT
Freq: Every day | Status: DC
  Administered 2014-05-26: 18:00:00

## 2014-05-25 MED ORDER — MOVING RIGHT ALONG BOOK
Freq: Once | Status: AC
  Administered 2014-05-25: 20:00:00
  Filled 2014-05-25: qty 1

## 2014-05-25 MED ORDER — MAGNESIUM HYDROXIDE 400 MG/5ML PO SUSP
30.0000 mL | Freq: Every day | ORAL | Status: DC | PRN
  Filled 2014-05-25: qty 30

## 2014-05-25 MED ORDER — WARFARIN VIDEO
Freq: Once | Status: AC
  Administered 2014-05-25: 18:00:00

## 2014-05-25 MED ORDER — WARFARIN SODIUM 5 MG PO TABS
5.0000 mg | ORAL_TABLET | Freq: Every day | ORAL | Status: DC
  Administered 2014-05-25 – 2014-05-29 (×5): 5 mg via ORAL
  Filled 2014-05-25 (×7): qty 1

## 2014-05-25 MED ORDER — ASPIRIN EC 81 MG PO TBEC
81.0000 mg | DELAYED_RELEASE_TABLET | Freq: Every day | ORAL | Status: DC
  Administered 2014-05-25 – 2014-05-29 (×5): 81 mg via ORAL
  Filled 2014-05-25 (×6): qty 1

## 2014-05-25 MED ORDER — GUAIFENESIN-DM 100-10 MG/5ML PO SYRP: 15.0000 mL | ORAL_SOLUTION | ORAL | Status: DC | PRN

## 2014-05-25 MED ORDER — ALUM & MAG HYDROXIDE-SIMETH 200-200-20 MG/5ML PO SUSP: 15.0000 mL | ORAL | Status: DC | PRN

## 2014-05-25 MED ORDER — AMIODARONE LOAD VIA INFUSION
150.0000 mg | Freq: Once | INTRAVENOUS | Status: AC
  Administered 2014-05-25: 150 mg via INTRAVENOUS
  Filled 2014-05-25: qty 83.34

## 2014-05-25 MED ORDER — SODIUM CHLORIDE 0.9 % IV SOLN: 250.0000 mL | INTRAVENOUS | Status: DC | PRN

## 2014-05-25 MED ORDER — COUMADIN BOOK
Freq: Once | Status: AC
  Administered 2014-05-25: 18:00:00
  Filled 2014-05-25: qty 1

## 2014-05-25 NOTE — Progress Notes (Addendum)
Started Cardizem IV at 5mg /hr as ordered at 2011, failure to reach goals at maximum infusion rate 15 mg/hr, HR 110-130's Afib, SBP in 110-120's, still nauseous, denies chest pain, not in respiratory distress. Dr. Tharon Aquas Trigt notified and ordered to give Amiodarone 150 mg IV bolus, Digoxin 0.5 IV once and to start Digoxin 0.125 mg PO daily to start this AM. Will keep on monitoring pt

## 2014-05-25 NOTE — Progress Notes (Signed)
3 Days Post-Op Procedure(s) (LRB): CORONARY ARTERY BYPASS GRAFTING (Caleb Wolf) times three using left internal mammary and right saphenous vein. (N/A) Subjective: Up in chair Didn't sleep well last night + flatus but still belching as well  Objective: Vital signs in last 24 hours: Temp:  [97.5 F (36.4 Wolf)-98.2 F (36.8 Wolf)] 97.8 F (36.6 Wolf) (12/24 0400) Pulse Rate:  [83-134] 97 (12/24 0800) Cardiac Rhythm:  [-] Atrial fibrillation (12/23 1919) Resp:  [11-36] 11 (12/23 1900) BP: (106-145)/(48-69) 134/48 mmHg (12/24 0800) SpO2:  [96 %-99 %] 99 % (12/24 0218)  Hemodynamic parameters for last 24 hours:    Intake/Output from previous day: 12/23 0701 - 12/24 0700 In: 785 [P.O.:240; I.V.:545] Out: 2105 [Urine:2105] Intake/Output this shift:    General appearance: alert, cooperative and no distress Neurologic: intact Heart: irregularly irregular rhythm Lungs: diminished breath sounds bibasilar Left> right Abdomen: distended, nontender, hypoactive BS Wound: dressing in place  Lab Results:  Recent Labs  05/24/14 0530 05/25/14 0250  WBC 20.8* 16.0*  HGB 10.8* 10.5*  HCT 34.5* 33.5*  PLT 130* 122*   BMET:  Recent Labs  05/24/14 0530 05/25/14 0250  NA 140 136  K 4.3 3.6  CL 104 99  CO2 28 30  GLUCOSE 142* 145*  BUN 21 21  CREATININE 1.45* 1.22  CALCIUM 7.9* 7.9*    PT/INR:  Recent Labs  05/22/14 1500  LABPROT 15.9*  INR 1.26   ABG    Component Value Date/Time   PHART 7.350 05/23/2014 0339   HCO3 22.7 05/23/2014 0339   TCO2 23 05/23/2014 1814   ACIDBASEDEF 3.0* 05/23/2014 0339   O2SAT 95.0 05/23/2014 0339   CBG (last 3)   Recent Labs  05/24/14 1206 05/24/14 1545 05/24/14 2200  GLUCAP 166* 107* 121*    Assessment/Plan: S/P Procedure(s) (LRB): CORONARY ARTERY BYPASS GRAFTING (Caleb Wolf) times three using left internal mammary and right saphenous vein. (N/A) -   POD # 3 emergency Caleb Wolf  CV- s/p Caleb Wolf, s/p MI  Back in rapid atrial fib last night- on  lopressor, amiodarone, digoxin and diltiazem  Will try to dc diltiazem as it can aggravate an ileus  Continue amiodarone, lopressor and dig. Give extra Dig IV this AM  Given that he presented with a fib and has it again postop will anticoagulate  Will start with coumadin - can probably switch to a newer agent in a few weeks  RESP- some left lower lobe atelectasis  Continue IS  RENAL- creatinine back to normal  Supplement K  ENDO- CBG well controlled  GI- ileus still present, but improving a little  Continue reglan another 24 hours  Transfer to [facility] when bed available  LOS: 3 days    Caleb Wolf 05/25/2014

## 2014-05-25 NOTE — Progress Notes (Signed)
Patient has not voided since foley removal @12 :45 12/24, bladder scan showed 947 ml in bladder, was told in report from day shift nurse Phyillis to place foley  if pt is retaining a significant amount of urine d/t is prior prostate hx. per Dr. Servando Snare. Foley placed at beside using sterile technique.

## 2014-05-25 NOTE — Progress Notes (Signed)
Patient ID: Caleb Wolf, male   DOB: 17-Sep-1940, 73 y.o.   MRN: [id number] EVENING ROUNDS NOTE :     [address]       [place] [address]             [phone]                 3 Days Post-Op Procedure(s) (LRB): CORONARY ARTERY BYPASS GRAFTING (CABG) times three using left internal mammary and right saphenous vein. (N/A)  Total Length of Stay:  LOS: 3 days  BP 112/63 mmHg  Pulse 142  Temp(Src) 98.1 F (36.7 C) (Oral)  Resp 12  Ht 5\' 9"  (1.753 m)  Wt 267 lb 3.2 oz (121.2 kg)  BMI 39.44 kg/m2  SpO2 97%  .Intake/Output      12/23 0701 - 12/24 0700 12/24 0701 - 12/25 0700   P.O. 240 1660   I.V. (mL/kg) 545 (4.5) 180.3 (1.5)   IV Piggyback     Total Intake(mL/kg) 785 (6.5) 1840.3 (15.2)   Urine (mL/kg/hr) 2105 (0.7) 230 (0.2)   Chest Tube     Total Output 2105 230   Net -1320 +1610.3          . sodium chloride Stopped (05/23/14 0900)  . sodium chloride    . sodium chloride 20 mL/hr (05/22/14 1550)  . amiodarone 30 mg/hr (05/25/14 1600)  . dexmedetomidine Stopped (05/23/14 0200)  . diltiazem (CARDIZEM) infusion Stopped (05/25/14 1200)  . DOPamine Stopped (05/23/14 1337)  . insulin (NOVOLIN-R) infusion Stopped (05/23/14 1553)  . lactated ringers Stopped (05/24/14 0400)  . nitroGLYCERIN Stopped (05/22/14 1550)  . phenylephrine (NEO-SYNEPHRINE) Adult infusion Stopped (05/22/14 1550)     Lab Results  Component Value Date   WBC 16.0* 05/25/2014   HGB 10.5* 05/25/2014   HCT 33.5* 05/25/2014   PLT 122* 05/25/2014   GLUCOSE 145* 05/25/2014   CHOL 116 05/25/2014   TRIG 113 05/25/2014   HDL 38* 05/25/2014   LDLCALC 55 05/25/2014   ALT 17 05/22/2014   AST 51* 05/22/2014   NA 136 05/25/2014   K 3.6 05/25/2014   CL 99 05/25/2014   CREATININE 1.22 05/25/2014   BUN 21 05/25/2014   CO2 30 05/25/2014   INR 1.26 05/22/2014   HGBA1C 5.8* 05/23/2014   Up to chair, still with illus  afib with rapid rate but control better   Grace Isaac  MD  Beeper [phone] Office [phone] 05/25/2014 5:22 PM

## 2014-05-26 ENCOUNTER — Inpatient Hospital Stay (HOSPITAL_COMMUNITY): Payer: Medicare Other

## 2014-05-26 LAB — BASIC METABOLIC PANEL
Anion gap: 9 (ref 5–15)
BUN: 20 mg/dL (ref 6–23)
CALCIUM: 8.3 mg/dL — AB (ref 8.4–10.5)
CO2: 29 mmol/L (ref 19–32)
Chloride: 101 mEq/L (ref 96–112)
Creatinine, Ser: 1.19 mg/dL (ref 0.50–1.35)
GFR calc Af Amer: 69 mL/min — ABNORMAL LOW (ref >90)
GFR, EST NON AFRICAN AMERICAN: 60 mL/min — AB (ref >90)
GLUCOSE: 70 mg/dL (ref 70–99)
Potassium: 4.1 mmol/L (ref 3.5–5.1)
Sodium: 139 mmol/L (ref 135–145)

## 2014-05-26 LAB — CBC
HCT: 32.7 % — ABNORMAL LOW (ref 39.0–52.0)
HEMOGLOBIN: 10.5 g/dL — AB (ref 13.0–17.0)
MCH: 30.3 pg (ref 26.0–34.0)
MCHC: 32.1 g/dL (ref 30.0–36.0)
MCV: 94.2 fL (ref 78.0–100.0)
Platelets: 147 10*3/uL — ABNORMAL LOW (ref 150–400)
RBC: 3.47 MIL/uL — AB (ref 4.22–5.81)
RDW: 13.5 % (ref 11.5–15.5)
WBC: 13.6 10*3/uL — ABNORMAL HIGH (ref 4.0–10.5)

## 2014-05-26 LAB — TSH: TSH: 2.948 u[IU]/mL (ref 0.350–4.500)

## 2014-05-26 LAB — GLUCOSE, CAPILLARY
GLUCOSE-CAPILLARY: 68 mg/dL — AB (ref 70–99)
GLUCOSE-CAPILLARY: 92 mg/dL (ref 70–99)
Glucose-Capillary: 114 mg/dL — ABNORMAL HIGH (ref 70–99)

## 2014-05-26 LAB — PROTIME-INR
INR: 1.07 (ref 0.00–1.49)
PROTHROMBIN TIME: 14 s (ref 11.6–15.2)

## 2014-05-26 MED ORDER — AMIODARONE HCL 200 MG PO TABS
400.0000 mg | ORAL_TABLET | Freq: Two times a day (BID) | ORAL | Status: DC
  Administered 2014-05-26 – 2014-05-30 (×9): 400 mg via ORAL
  Filled 2014-05-26 (×11): qty 2

## 2014-05-26 MED ORDER — DILTIAZEM LOAD VIA INFUSION
10.0000 mg | Freq: Once | INTRAVENOUS | Status: AC
  Administered 2014-05-26: 10 mg via INTRAVENOUS
  Filled 2014-05-26: qty 10

## 2014-05-26 MED ORDER — AMIODARONE HCL 200 MG PO TABS: 400.0000 mg | ORAL_TABLET | Freq: Every day | ORAL | Status: DC

## 2014-05-26 MED ORDER — DEXTROSE 5 % IV SOLN
10.0000 mg/h | INTRAVENOUS | Status: DC
Start: 2014-05-26 — End: 2014-05-27
  Administered 2014-05-26 (×2): 10 mg/h via INTRAVENOUS
  Administered 2014-05-27: 5 mg/h via INTRAVENOUS
  Filled 2014-05-26: qty 100

## 2014-05-26 NOTE — Progress Notes (Signed)
Patient ID: Caleb Wolf, male   DOB: 1940/09/26, 73 y.o.   MRN: [id number] EVENING ROUNDS NOTE :     [address]       [place] [address]             [phone]                 4 Days Post-Op Procedure(s) (LRB): CORONARY ARTERY BYPASS GRAFTING (CABG) times three using left internal mammary and right saphenous vein. (N/A)  Total Length of Stay:  LOS: 4 days  BP 142/57 mmHg  Pulse 118  Temp(Src) 97.9 F (36.6 C) (Oral)  Resp 13  Ht 5\' 9"  (1.753 m)  Wt 268 lb 4.8 oz (121.7 kg)  BMI 39.60 kg/m2  SpO2 96%  .Intake/Output      12/25 0701 - 12/26 0700   P.O. 850   I.V. (mL/kg) 195.1 (1.6)   Total Intake(mL/kg) 1045.1 (8.6)   Urine (mL/kg/hr) 1615 (1.1)   Total Output 1615   Net -569.9         . diltiazem (CARDIZEM) infusion Stopped (05/25/14 1200)  . diltiazem (CARDIZEM) infusion 10 mg/hr (05/26/14 1330)     Lab Results  Component Value Date   WBC 13.6* 05/26/2014   HGB 10.5* 05/26/2014   HCT 32.7* 05/26/2014   PLT 147* 05/26/2014   GLUCOSE 70 05/26/2014   CHOL 116 05/25/2014   TRIG 113 05/25/2014   HDL 38* 05/25/2014   LDLCALC 55 05/25/2014   ALT 17 05/22/2014   AST 51* 05/22/2014   NA 139 05/26/2014   K 4.1 05/26/2014   CL 101 05/26/2014   CREATININE 1.19 05/26/2014   BUN 20 05/26/2014   CO2 29 05/26/2014   TSH 2.948 05/26/2014   INR 1.07 05/26/2014   HGBA1C 5.8* 05/23/2014   Back on cardizem drip to control rate still afib [place] MD  Beeper [phone] Office [phone] 05/26/2014 7:25 PM

## 2014-05-26 NOTE — Progress Notes (Addendum)
Patient ID: Caleb Wolf, male   DOB: 1940/09/04, 73 y.o.   MRN: [id number] TCTS DAILY ICU PROGRESS NOTE                   [address]            [place] [address]          [phone]   4 Days Post-Op Procedure(s) (LRB): CORONARY ARTERY BYPASS GRAFTING (CABG) times three using left internal mammary and right saphenous vein. (N/A)  Total Length of Stay:  LOS: 4 days   Subjective: Still in afib, walked around unit, developed urinary retention last pm  Objective: Vital signs in last 24 hours: Temp:  [97.4 F (36.3 C)-98.3 F (36.8 C)] 97.7 F (36.5 C) (12/25 0728) Pulse Rate:  [61-142] 107 (12/25 0800) Cardiac Rhythm:  [-] Atrial fibrillation (12/25 0400) Resp:  [9-33] 22 (12/25 0800) BP: (84-140)/(40-89) 128/73 mmHg (12/25 0800) SpO2:  [97 %-100 %] 100 % (12/25 0800) Weight:  [268 lb 4.8 oz (121.7 kg)] 268 lb 4.8 oz (121.7 kg) (12/25 0500)  Filed Weights   05/24/14 0500 05/26/14 0500  Weight: 267 lb 3.2 oz (121.2 kg) 268 lb 4.8 oz (121.7 kg)    Weight change:    Hemodynamic parameters for last 24 hours:    Intake/Output from previous day: 12/24 0701 - 12/25 0700 In: 2254.1 [P.O.:1840; I.V.:414.1] Out: 1295 [Urine:1295]  Intake/Output this shift: Total I/O In: -  Out: 135 [Urine:135]  Current Meds: Scheduled Meds: . acetaminophen  1,000 mg Oral 4 times per day   Or  . acetaminophen (TYLENOL) oral liquid 160 mg/5 mL  1,000 mg Per Tube 4 times per day  . aspirin EC  81 mg Oral Daily   Or  . aspirin  81 mg Per Tube Daily  . atorvastatin  40 mg Oral q1800  . bisacodyl  10 mg Oral Daily   Or  . bisacodyl  10 mg Rectal Daily  . digoxin  0.125 mg Oral Daily  . docusate sodium  200 mg Oral Daily  . doxazosin  2 mg Oral Daily  . enoxaparin (LOVENOX) injection  40 mg Subcutaneous QHS  . finasteride  5 mg Oral QHS  . furosemide  40 mg Oral Daily  . insulin aspart  0-15 Units Subcutaneous TID WC  . insulin detemir  20 Units Subcutaneous BID  .  metoCLOPramide (REGLAN) injection  10 mg Intravenous 4 times per day  . metoprolol tartrate  25 mg Oral BID   Or  . metoprolol tartrate  25 mg Per Tube BID  . mupirocin ointment  1 application Nasal BID  . pantoprazole  40 mg Oral Daily  . potassium chloride  40 mEq Oral BID  . sodium chloride  3 mL Intravenous Q12H  . warfarin  5 mg Oral q1800  . Warfarin - Physician Dosing Inpatient   Does not apply q1800   Continuous Infusions: . amiodarone 30 mg/hr (05/26/14 0600)  . diltiazem (CARDIZEM) infusion Stopped (05/25/14 1200)   PRN Meds:.sodium chloride, alum & mag hydroxide-simeth, guaiFENesin-dextromethorphan, magnesium hydroxide, ondansetron (ZOFRAN) IV, oxyCODONE, sodium chloride, traMADol  General appearance: alert, cooperative and no distress Neurologic: intact Heart: irregularly irregular rhythm Lungs: diminished breath sounds bibasilar Abdomen: soft, non-tender; bowel sounds  Now present ; no masses,  no organomegaly Extremities: extremities normal, atraumatic, no cyanosis or edema and Homans sign is negative, no sign of DVT Wound: sternum stable  Lab Results: CBC: Recent Labs  05/25/14  0250 05/26/14 0310  WBC 16.0* 13.6*  HGB 10.5* 10.5*  HCT 33.5* 32.7*  PLT 122* 147*   BMET:  Recent Labs  05/25/14 0250 05/26/14 0310  NA 136 139  K 3.6 4.1  CL 99 101  CO2 30 29  GLUCOSE 145* 70  BUN 21 20  CREATININE 1.22 1.19  CALCIUM 7.9* 8.3*    PT/INR:  Recent Labs  05/26/14 0310  LABPROT 14.0  INR 1.07   Radiology: Dg Chest Port 1 View  05/25/2014   CLINICAL DATA:  Status post coronary artery bypass grafting  EXAM: PORTABLE CHEST - 1 VIEW  COMPARISON:  May 24, 2014  FINDINGS: Cardiomegaly persists. Pulmonary vascularity is normal. There is atelectatic change in the left mid lung and base regions. Right lung is clear. No pneumothorax. No adenopathy.  IMPRESSION: Left mid lung and base atelectatic change. No new opacity. Stable cardiomegaly. No pneumothorax.    Electronically Signed   By: Lowella Grip [name]   On: 05/25/2014 07:23     Assessment/Plan: S/P Procedure(s) (LRB): CORONARY ARTERY BYPASS GRAFTING (CABG) times three using left internal mammary and right saphenous vein. (N/A) Mobilize Diuresis Diabetes control Continue foley due to acute urinary retention and bladder outlet obstruction On  Flomax and Cardura Thrombophlebitis right fore am due to Cordarone    On coumadin now    Caleb Wolf 05/26/2014 8:25 AM

## 2014-05-26 NOTE — Progress Notes (Signed)
   Amiodarone Drug - Drug Interaction Consult Note  Recommendations: See below for specific drug interactions.    Amiodarone is metabolized by the cytochrome P450 system and therefore has the potential to cause many drug interactions. Amiodarone has an average plasma half-life of 50 days (range 20 to 100 days).   There is potential for drug interactions to occur several weeks or months after stopping treatment and the onset of drug interactions may be slow after initiating amiodarone.   [x]  Statins: Increased risk of myopathy. Simvastatin- restrict dose to 20mg  daily. Other statins: counsel patients to report any muscle pain or weakness immediately.  [x]  Anticoagulants: Amiodarone can increase anticoagulant effect. Consider warfarin dose reduction. Patients should be monitored closely and the dose of anticoagulant altered accordingly, remembering that amiodarone levels take several weeks to stabilize.  []  Antiepileptics: Amiodarone can increase plasma concentration of phenytoin, the dose should be reduced. Note that small changes in phenytoin dose can result in large changes in levels. Monitor patient and counsel on signs of toxicity.  [x]  Beta blockers: increased risk of bradycardia, AV block and myocardial depression. Sotalol - avoid concomitant use.  []   Calcium channel blockers (diltiazem and verapamil): increased risk of bradycardia, AV block and myocardial depression.  []   Cyclosporine: Amiodarone increases levels of cyclosporine. Reduced dose of cyclosporine is recommended.  [x]  Digoxin dose should be halved when amiodarone is started.  [x]  Diuretics: increased risk of cardiotoxicity if hypokalemia occurs.  []  Oral hypoglycemic agents (glyburide, glipizide, glimepiride): increased risk of hypoglycemia. Patient's glucose levels should be monitored closely when initiating amiodarone therapy.   []  Drugs that prolong the QT interval:  Torsades de pointes risk may be increased with  concurrent use - avoid if possible.  Monitor QTc, also keep magnesium/potassium WNL if concurrent therapy can't be avoided. Marland Kitchen Antibiotics: e.g. fluoroquinolones, erythromycin. . Antiarrhythmics: e.g. quinidine, procainamide, disopyramide, sotalol. . Antipsychotics: e.g. phenothiazines, haloperidol.  . Lithium, tricyclic antidepressants, and methadone. Thank You,   Theron Arista, PharmD Clinical Pharmacist - Resident Pager: [phone] 12/25/20159:33 AM

## 2014-05-27 LAB — GLUCOSE, CAPILLARY
Glucose-Capillary: 126 mg/dL — ABNORMAL HIGH (ref 70–99)
Glucose-Capillary: 129 mg/dL — ABNORMAL HIGH (ref 70–99)
Glucose-Capillary: 130 mg/dL — ABNORMAL HIGH (ref 70–99)
Glucose-Capillary: 104 mg/dL — ABNORMAL HIGH (ref 70–99)
Glucose-Capillary: 123 mg/dL — ABNORMAL HIGH (ref 70–99)

## 2014-05-27 LAB — CBC
HCT: 30.5 % — ABNORMAL LOW (ref 39.0–52.0)
Hemoglobin: 9.6 g/dL — ABNORMAL LOW (ref 13.0–17.0)
MCH: 29.5 pg (ref 26.0–34.0)
MCHC: 31.5 g/dL (ref 30.0–36.0)
MCV: 93.8 fL (ref 78.0–100.0)
Platelets: 183 10*3/uL (ref 150–400)
RBC: 3.25 MIL/uL — ABNORMAL LOW (ref 4.22–5.81)
RDW: 13.5 % (ref 11.5–15.5)
WBC: 10.1 10*3/uL (ref 4.0–10.5)

## 2014-05-27 LAB — BASIC METABOLIC PANEL
Anion gap: 7 (ref 5–15)
BUN: 18 mg/dL (ref 6–23)
CO2: 30 mmol/L (ref 19–32)
Calcium: 8 mg/dL — ABNORMAL LOW (ref 8.4–10.5)
Chloride: 100 mEq/L (ref 96–112)
Creatinine, Ser: 1.19 mg/dL (ref 0.50–1.35)
GFR calc Af Amer: 69 mL/min — ABNORMAL LOW (ref >90)
GFR calc non Af Amer: 60 mL/min — ABNORMAL LOW (ref >90)
Glucose, Bld: 105 mg/dL — ABNORMAL HIGH (ref 70–99)
Potassium: 4.3 mmol/L (ref 3.5–5.1)
Sodium: 137 mmol/L (ref 135–145)

## 2014-05-27 LAB — PROTIME-INR
INR: 1.13 (ref 0.00–1.49)
Prothrombin Time: 14.6 seconds (ref 11.6–15.2)

## 2014-05-27 LAB — MAGNESIUM: Magnesium: 1.9 mg/dL (ref 1.5–2.5)

## 2014-05-27 LAB — DIGOXIN LEVEL: Digoxin Level: 0.6 ng/mL — ABNORMAL LOW (ref 0.8–2.0)

## 2014-05-27 MED ORDER — DILTIAZEM HCL 60 MG PO TABS
60.0000 mg | ORAL_TABLET | Freq: Three times a day (TID) | ORAL | Status: DC
  Administered 2014-05-27 – 2014-05-30 (×9): 60 mg via ORAL
  Filled 2014-05-27 (×14): qty 1

## 2014-05-27 MED ORDER — WARFARIN SODIUM 5 MG PO TABS: 5.0000 mg | ORAL_TABLET | Freq: Once | ORAL | Status: DC

## 2014-05-27 NOTE — Discharge Instructions (Addendum)

## 2014-05-27 NOTE — Progress Notes (Signed)
Patient ID: Caleb Wolf, male   DOB: 10/19/1940, 73 y.o.   MRN: [id number] TCTS DAILY ICU PROGRESS NOTE                   [address]            [place] [address]          [phone]   5 Days Post-Op Procedure(s) (LRB): CORONARY ARTERY BYPASS GRAFTING (CABG) times three using left internal mammary and right saphenous vein. (N/A)  Total Length of Stay:  LOS: 5 days   Subjective: Convert to sinus last pm holding sinus  Objective: Vital signs in last 24 hours: Temp:  [97.9 F (36.6 C)-98.4 F (36.9 C)] 98.3 F (36.8 C) (12/26 0700) Pulse Rate:  [72-145] 73 (12/26 0900) Cardiac Rhythm:  [-] Atrial fibrillation (12/26 0800) Resp:  [10-36] 12 (12/26 0900) BP: (96-146)/(41-68) 144/54 mmHg (12/26 0900) SpO2:  [93 %-100 %] 97 % (12/26 0900) Weight:  [268 lb 4.8 oz (121.7 kg)] 268 lb 4.8 oz (121.7 kg) (12/26 0600)  Filed Weights   05/24/14 0500 05/26/14 0500 05/27/14 0600  Weight: 267 lb 3.2 oz (121.2 kg) 268 lb 4.8 oz (121.7 kg) 268 lb 4.8 oz (121.7 kg)    Weight change: 0 lb (0 kg)   Hemodynamic parameters for last 24 hours:    Intake/Output from previous day: 12/25 0701 - 12/26 0700 In: 2135.1 [P.O.:1810; I.V.:325.1] Out: 2695 [Urine:2695]  Intake/Output this shift: Total I/O In: 15 [I.V.:15] Out: -   Current Meds: Scheduled Meds: . acetaminophen  1,000 mg Oral 4 times per day   Or  . acetaminophen (TYLENOL) oral liquid 160 mg/5 mL  1,000 mg Per Tube 4 times per day  . amiodarone  400 mg Oral Q12H   Followed by  . [START ON 06/09/2014] amiodarone  400 mg Oral Daily  . aspirin EC  81 mg Oral Daily   Or  . aspirin  81 mg Per Tube Daily  . atorvastatin  40 mg Oral q1800  . bisacodyl  10 mg Oral Daily   Or  . bisacodyl  10 mg Rectal Daily  . digoxin  0.125 mg Oral Daily  . docusate sodium  200 mg Oral Daily  . doxazosin  2 mg Oral Daily  . enoxaparin (LOVENOX) injection  40 mg Subcutaneous QHS  . finasteride  5 mg Oral QHS  . furosemide  40  mg Oral Daily  . insulin aspart  0-15 Units Subcutaneous TID WC  . insulin detemir  20 Units Subcutaneous BID  . metoprolol tartrate  25 mg Oral BID   Or  . metoprolol tartrate  25 mg Per Tube BID  . mupirocin ointment  1 application Nasal BID  . pantoprazole  40 mg Oral Daily  . potassium chloride  40 mEq Oral BID  . sodium chloride  3 mL Intravenous Q12H  . warfarin  5 mg Oral q1800  . Warfarin - Physician Dosing Inpatient   Does not apply q1800   Continuous Infusions: . diltiazem (CARDIZEM) infusion Stopped (05/25/14 1200)  . diltiazem (CARDIZEM) infusion 5 mg/hr (05/27/14 0800)   PRN Meds:.sodium chloride, alum & mag hydroxide-simeth, guaiFENesin-dextromethorphan, magnesium hydroxide, ondansetron (ZOFRAN) IV, oxyCODONE, sodium chloride, traMADol  General appearance: alert and cooperative Neurologic: intact Heart: regular rate and rhythm, S1, S2 normal, no murmur, click, rub or gallop Lungs: clear to auscultation bilaterally Abdomen: soft, non-tender; bowel sounds normal; no masses,  no organomegaly Extremities: extremities normal, atraumatic,  no cyanosis or edema and Homans sign is negative, no sign of DVT Wound: sternum stable  Lab Results: CBC: Recent Labs  05/26/14 0310 05/27/14 0300  WBC 13.6* 10.1  HGB 10.5* 9.6*  HCT 32.7* 30.5*  PLT 147* 183   BMET:  Recent Labs  05/26/14 0310 05/27/14 0300  NA 139 137  K 4.1 4.3  CL 101 100  CO2 29 30  GLUCOSE 70 105*  BUN 20 18  CREATININE 1.19 1.19  CALCIUM 8.3* 8.0*    PT/INR:  Recent Labs  05/27/14 0300  LABPROT 14.6  INR 1.13   Radiology: Dg Chest 2 View  05/26/2014   CLINICAL DATA:  Shortness of breath  EXAM: CHEST  2 VIEW  COMPARISON:  05/25/2014  FINDINGS: Cardiac shadow is again enlarged but stable from the prior exam. Postsurgical changes are again seen. The lungs are well aerated bilaterally with small bilateral pleural effusions. No focal infiltrate or sizable effusion is seen.  IMPRESSION: Small  bilateral pleural effusions. No other focal abnormality is noted.   Electronically Signed   By: Inez Catalina [name]   On: 05/26/2014 08:35     Assessment/Plan: S/P Procedure(s) (LRB): CORONARY ARTERY BYPASS GRAFTING (CABG) times three using left internal mammary and right saphenous vein. (N/A) Mobilize Diuresis Continue foley due to acute urinary retention Plan for transfer to step-down: see transfer orders Convert to po Cardizem, continue coumadin for afib on admission On cordqrone ,dig beta blocker and cardizem Try to remove foley in am  Radford Pease B 05/27/2014 9:59 AM

## 2014-05-28 ENCOUNTER — Inpatient Hospital Stay (HOSPITAL_COMMUNITY): Payer: Medicare Other

## 2014-05-28 DIAGNOSIS — Z951 Presence of aortocoronary bypass graft: Secondary | ICD-10-CM

## 2014-05-28 DIAGNOSIS — I209 Angina pectoris, unspecified: Secondary | ICD-10-CM

## 2014-05-28 LAB — CBC
HCT: 31.4 % — ABNORMAL LOW (ref 39.0–52.0)
Hemoglobin: 10.1 g/dL — ABNORMAL LOW (ref 13.0–17.0)
MCH: 30.4 pg (ref 26.0–34.0)
MCHC: 32.2 g/dL (ref 30.0–36.0)
MCV: 94.6 fL (ref 78.0–100.0)
Platelets: 205 10*3/uL (ref 150–400)
RBC: 3.32 MIL/uL — ABNORMAL LOW (ref 4.22–5.81)
RDW: 13.3 % (ref 11.5–15.5)
WBC: 8.6 10*3/uL (ref 4.0–10.5)

## 2014-05-28 LAB — PROTIME-INR
INR: 1.2 (ref 0.00–1.49)
PROTHROMBIN TIME: 15.3 s — AB (ref 11.6–15.2)

## 2014-05-28 LAB — BASIC METABOLIC PANEL
Anion gap: 5 (ref 5–15)
BUN: 15 mg/dL (ref 6–23)
CO2: 30 mmol/L (ref 19–32)
Calcium: 8.4 mg/dL (ref 8.4–10.5)
Chloride: 105 mEq/L (ref 96–112)
Creatinine, Ser: 1.2 mg/dL (ref 0.50–1.35)
GFR calc Af Amer: 68 mL/min — ABNORMAL LOW (ref >90)
GFR calc non Af Amer: 59 mL/min — ABNORMAL LOW (ref >90)
Glucose, Bld: 96 mg/dL (ref 70–99)
Potassium: 4.6 mmol/L (ref 3.5–5.1)
Sodium: 140 mmol/L (ref 135–145)

## 2014-05-28 LAB — GLUCOSE, CAPILLARY
GLUCOSE-CAPILLARY: 75 mg/dL (ref 70–99)
GLUCOSE-CAPILLARY: 113 mg/dL — AB (ref 70–99)
Glucose-Capillary: 121 mg/dL — ABNORMAL HIGH (ref 70–99)

## 2014-05-28 LAB — CK: Total CK: 63 U/L (ref 7–232)

## 2014-05-28 MED ORDER — INDOMETHACIN 50 MG PO CAPS
50.0000 mg | ORAL_CAPSULE | Freq: Two times a day (BID) | ORAL | Status: AC
  Administered 2014-05-28 – 2014-05-30 (×4): 50 mg via ORAL
  Filled 2014-05-28 (×4): qty 1

## 2014-05-28 MED ORDER — COLCHICINE 0.6 MG PO TABS
0.6000 mg | ORAL_TABLET | Freq: Every day | ORAL | Status: DC
  Administered 2014-05-28 – 2014-05-30 (×3): 0.6 mg via ORAL
  Filled 2014-05-28 (×3): qty 1

## 2014-05-28 NOTE — Progress Notes (Addendum)
[address]       [place] [address]             [phone]      6 Days Post-Op Procedure(s) (LRB): CORONARY ARTERY BYPASS GRAFTING (CABG) times three using left internal mammary and right saphenous vein. (N/A) Subjective: C/o left foot gout flare-up( happens when gets stressed)  Objective: Vital signs in last 24 hours: Temp:  [98.3 F (36.8 C)-98.6 F (37 C)] 98.3 F (36.8 C) (12/27 0424) Pulse Rate:  [75-84] 80 (12/27 0424) Cardiac Rhythm:  [-] Normal sinus rhythm (12/27 0715) Resp:  [15-23] 20 (12/27 0424) BP: (134-160)/(52-71) 144/71 mmHg (12/27 0424) SpO2:  [94 %-98 %] 97 % (12/27 0424) Weight:  [266 lb 1.5 oz (120.7 kg)] 266 lb 1.5 oz (120.7 kg) (12/27 0424)  Hemodynamic parameters for last 24 hours:    Intake/Output from previous day: 12/26 0701 - 12/27 0700 In: 270 [P.O.:240; I.V.:30] Out: 4150 [Urine:4150] Intake/Output this shift: Total I/O In: -  Out: 600 [Urine:600]  General appearance: alert, cooperative and no distress Heart: regular rate and rhythm Lungs: clear to auscultation bilaterally Abdomen: benign Extremities: left midfoot pain when puts pressure on foot, not tender to touch currently Wound: incis healing well   Lab Results:  Recent Labs  05/27/14 0300 05/28/14 0521  WBC 10.1 8.6  HGB 9.6* 10.1*  HCT 30.5* 31.4*  PLT 183 205   BMET:  Recent Labs  05/27/14 0300 05/28/14 0521  NA 137 140  K 4.3 4.6  CL 100 105  CO2 30 30  GLUCOSE 105* 96  BUN 18 15  CREATININE 1.19 1.20  CALCIUM 8.0* 8.4    PT/INR:  Recent Labs  05/28/14 0521  LABPROT 15.3*  INR 1.20   ABG    Component Value Date/Time   PHART 7.350 05/23/2014 0339   HCO3 22.7 05/23/2014 0339   TCO2 23 05/23/2014 1814   ACIDBASEDEF 3.0* 05/23/2014 0339   O2SAT 95.0 05/23/2014 0339   CBG (last 3)   Recent Labs  05/27/14 1616 05/27/14 2150 05/28/14 0614  GLUCAP 104* 123* 75    Meds Scheduled Meds: . amiodarone  400 mg Oral Q12H     Followed by  . [START ON 06/09/2014] amiodarone  400 mg Oral Daily  . aspirin EC  81 mg Oral Daily   Or  . aspirin  81 mg Per Tube Daily  . atorvastatin  40 mg Oral q1800  . bisacodyl  10 mg Oral Daily   Or  . bisacodyl  10 mg Rectal Daily  . digoxin  0.125 mg Oral Daily  . diltiazem  60 mg Oral 3 times per day  . docusate sodium  200 mg Oral Daily  . doxazosin  2 mg Oral Daily  . enoxaparin (LOVENOX) injection  40 mg Subcutaneous QHS  . finasteride  5 mg Oral QHS  . furosemide  40 mg Oral Daily  . insulin aspart  0-15 Units Subcutaneous TID WC  . insulin detemir  20 Units Subcutaneous BID  . metoprolol tartrate  25 mg Oral BID   Or  . metoprolol tartrate  25 mg Per Tube BID  . pantoprazole  40 mg Oral Daily  . potassium chloride  40 mEq Oral BID  . sodium chloride  3 mL Intravenous Q12H  . warfarin  5 mg Oral q1800  . Warfarin - Physician Dosing Inpatient   Does not apply q1800   Continuous Infusions:  PRN Meds:.sodium chloride,  alum & mag hydroxide-simeth, guaiFENesin-dextromethorphan, magnesium hydroxide, ondansetron (ZOFRAN) IV, oxyCODONE, sodium chloride, traMADol  Xrays No results found.  Assessment/Plan: S/P Procedure(s) (LRB): CORONARY ARTERY BYPASS GRAFTING (CABG) times three using left internal mammary and right saphenous vein. (N/A)  1 steady progress, maintaining sinus rhtyhm 2 will try voiding trial 3 will add colchicine/short term nsaid for gout 4 cont gentle diuresis 5 monitor BP on current meds- may be able to add ace inhib- recheck creat in am   LOS: 6 days    GOLD,WAYNE E 05/28/2014  Holding sinus  Foley out this am Follow up bladder scan later today I have seen and examined Dionne Ano and agree with the above assessment  and plan.  Grace Isaac MD Beeper [phone] Office [phone] 05/28/2014 11:25 AM

## 2014-05-28 NOTE — Progress Notes (Signed)
Pt ambulated in hallway 350 ft on room air and tolerated activity well. Will continue to monitor.

## 2014-05-28 NOTE — Progress Notes (Signed)
Pt ambulated in hallway 350 ft with rolling walker on room air and tolerated activity well. Will continue to monitor.

## 2014-05-28 NOTE — Progress Notes (Signed)
Foley removed per MD order and protocol.Pt tolerated procedure well. Pt instructed to call when he feels the urge to void. Will continue to monitor.

## 2014-05-28 NOTE — Progress Notes (Signed)
Subjective:  POD #6 CABG X 3 with NSTEMI, PAF requiring IABP. Now NSR  Objective:  Temp:  [98.3 F (36.8 C)-98.6 F (37 C)] 98.3 F (36.8 C) (12/27 0424) Pulse Rate:  [76-84] 80 (12/27 0424) Resp:  [15-23] 20 (12/27 0424) BP: (134-144)/(52-71) 144/71 mmHg (12/27 0424) SpO2:  [94 %-98 %] 97 % (12/27 0424) Weight:  [266 lb 1.5 oz (120.7 kg)] 266 lb 1.5 oz (120.7 kg) (12/27 0424) Weight change: -2 lb 3.3 oz (-1 kg)  Intake/Output from previous day: 12/26 0701 - 12/27 0700 In: 270 [P.O.:240; I.V.:30] Out: 4150 [Urine:4150]  Intake/Output from this shift: Total I/O In: -  Out: 600 [Urine:600]  Physical Exam: General appearance: alert and no distress Neck: no adenopathy, no carotid bruit, no JVD, supple, symmetrical, trachea midline and thyroid not enlarged, symmetric, no tenderness/mass/nodules Lungs: clear to auscultation bilaterally Heart: regular rate and rhythm, S1, S2 normal, no murmur, click, rub or gallop Extremities: extremities normal, atraumatic, no cyanosis or edema  Lab Results: Results for orders placed or performed during the hospital encounter of 05/22/14 (from the past 48 hour(s))  Glucose, capillary     Status: None   Collection Time: 05/26/14 11:30 AM  Result Value Ref Range   Glucose-Capillary 92 70 - 99 mg/dL   Comment 1 Capillary Sample   Glucose, capillary     Status: Abnormal   Collection Time: 05/26/14  3:40 PM  Result Value Ref Range   Glucose-Capillary 126 (H) 70 - 99 mg/dL   Comment 1 Capillary Sample   Glucose, capillary     Status: Abnormal   Collection Time: 05/26/14 10:00 PM  Result Value Ref Range   Glucose-Capillary 114 (H) 70 - 99 mg/dL   Comment 1 Documented in Chart    Comment 2 Notify RN   Protime-INR     Status: None   Collection Time: 05/27/14  3:00 AM  Result Value Ref Range   Prothrombin Time 14.6 11.6 - 15.2 seconds   INR 1.13 0.00 - 1.49  Magnesium     Status: None   Collection Time: 05/27/14  3:00 AM  Result  Value Ref Range   Magnesium 1.9 1.5 - 2.5 mg/dL  Digoxin level     Status: Abnormal   Collection Time: 05/27/14  3:00 AM  Result Value Ref Range   Digoxin Level 0.6 (L) 0.8 - 2.0 ng/mL  Basic metabolic panel     Status: Abnormal   Collection Time: 05/27/14  3:00 AM  Result Value Ref Range   Sodium 137 135 - 145 mmol/L    Comment: Please note change in reference range.   Potassium 4.3 3.5 - 5.1 mmol/L    Comment: Please note change in reference range.   Chloride 100 96 - 112 mEq/L   CO2 30 19 - 32 mmol/L   Glucose, Bld 105 (H) 70 - 99 mg/dL   BUN 18 6 - 23 mg/dL   Creatinine, Ser 1.19 0.50 - 1.35 mg/dL   Calcium 8.0 (L) 8.4 - 10.5 mg/dL   GFR calc non Af Amer 60 (L) >90 mL/min   GFR calc Af Amer 69 (L) >90 mL/min    Comment: (NOTE) The eGFR has been calculated using the CKD EPI equation. This calculation has not been validated in all clinical situations. eGFR's persistently <90 mL/min signify possible Chronic Kidney Disease.    Anion gap 7 5 - 15  CBC     Status: Abnormal   Collection Time: 05/27/14  3:00  AM  Result Value Ref Range   WBC 10.1 4.0 - 10.5 K/uL   RBC 3.25 (L) 4.22 - 5.81 MIL/uL   Hemoglobin 9.6 (L) 13.0 - 17.0 g/dL   HCT 30.5 (L) 39.0 - 52.0 %   MCV 93.8 78.0 - 100.0 fL   MCH 29.5 26.0 - 34.0 pg   MCHC 31.5 30.0 - 36.0 g/dL   RDW 13.5 11.5 - 15.5 %   Platelets 183 150 - 400 K/uL  Glucose, capillary     Status: Abnormal   Collection Time: 05/27/14  8:50 AM  Result Value Ref Range   Glucose-Capillary 129 (H) 70 - 99 mg/dL   Comment 1 Notify RN   Glucose, capillary     Status: Abnormal   Collection Time: 05/27/14 12:07 PM  Result Value Ref Range   Glucose-Capillary 130 (H) 70 - 99 mg/dL   Comment 1 Notify RN   Glucose, capillary     Status: Abnormal   Collection Time: 05/27/14  4:16 PM  Result Value Ref Range   Glucose-Capillary 104 (H) 70 - 99 mg/dL  Glucose, capillary     Status: Abnormal   Collection Time: 05/27/14  9:50 PM  Result Value Ref Range     Glucose-Capillary 123 (H) 70 - 99 mg/dL  Protime-INR     Status: Abnormal   Collection Time: 05/28/14  5:21 AM  Result Value Ref Range   Prothrombin Time 15.3 (H) 11.6 - 15.2 seconds   INR 1.20 0.00 - 1.63  Basic metabolic panel     Status: Abnormal   Collection Time: 05/28/14  5:21 AM  Result Value Ref Range   Sodium 140 135 - 145 mmol/L    Comment: Please note change in reference range.   Potassium 4.6 3.5 - 5.1 mmol/L    Comment: Please note change in reference range.   Chloride 105 96 - 112 mEq/L   CO2 30 19 - 32 mmol/L   Glucose, Bld 96 70 - 99 mg/dL   BUN 15 6 - 23 mg/dL   Creatinine, Ser 1.20 0.50 - 1.35 mg/dL   Calcium 8.4 8.4 - 10.5 mg/dL   GFR calc non Af Amer 59 (L) >90 mL/min   GFR calc Af Amer 68 (L) >90 mL/min    Comment: (NOTE) The eGFR has been calculated using the CKD EPI equation. This calculation has not been validated in all clinical situations. eGFR's persistently <90 mL/min signify possible Chronic Kidney Disease.    Anion gap 5 5 - 15  CBC     Status: Abnormal   Collection Time: 05/28/14  5:21 AM  Result Value Ref Range   WBC 8.6 4.0 - 10.5 K/uL   RBC 3.32 (L) 4.22 - 5.81 MIL/uL   Hemoglobin 10.1 (L) 13.0 - 17.0 g/dL   HCT 31.4 (L) 39.0 - 52.0 %   MCV 94.6 78.0 - 100.0 fL   MCH 30.4 26.0 - 34.0 pg   MCHC 32.2 30.0 - 36.0 g/dL   RDW 13.3 11.5 - 15.5 %   Platelets 205 150 - 400 K/uL  CK     Status: None   Collection Time: 05/28/14  5:21 AM  Result Value Ref Range   Total CK 63 7 - 232 U/L  Glucose, capillary     Status: None   Collection Time: 05/28/14  6:14 AM  Result Value Ref Range   Glucose-Capillary 75 70 - 99 mg/dL    Imaging: Imaging results have been reviewed  Tele: NSR  Assessment/Plan:   1. Principal Problem: 2.   Atrial fibrillation with RVR 3. Active Problems: 4.   Chest pain 5.   Abnormal EKG 6.   NSTEMI (non-ST elevated myocardial infarction) 7.   Carotid artery stenosis without cerebral infarction 8.   S/P CABG x  3 9.   Time Spent Directly with Patient:  15 minutes  Length of Stay:  LOS: 6 days   POD #6 CABG X 3. Had PAF converted to NSR. On PO Amio, dig, SQ Lovenox and coumadin AC. INR 1.2. Looks great. Ambulating. Nl progression per TCTS. F/U with Dr. Bronson Ing as OP.  Lorretta Harp 05/28/2014, 10:17 AM

## 2014-05-28 NOTE — Progress Notes (Signed)
Patient ambulated in the hall 329ft behind a wheelchair, no sob or distress, no pain complaints. Return to the room and bed. Walk was tolerated well, left in room with wife at the bedside and call bell in reach.

## 2014-05-29 LAB — BASIC METABOLIC PANEL
ANION GAP: 7 (ref 5–15)
BUN: 15 mg/dL (ref 6–23)
CHLORIDE: 101 meq/L (ref 96–112)
CO2: 31 mmol/L (ref 19–32)
Calcium: 8.4 mg/dL (ref 8.4–10.5)
Creatinine, Ser: 1.24 mg/dL (ref 0.50–1.35)
GFR calc non Af Amer: 57 mL/min — ABNORMAL LOW (ref >90)
GFR, EST AFRICAN AMERICAN: 66 mL/min — AB (ref >90)
Glucose, Bld: 98 mg/dL (ref 70–99)
Potassium: 4.7 mmol/L (ref 3.5–5.1)
SODIUM: 139 mmol/L (ref 135–145)

## 2014-05-29 LAB — GLUCOSE, CAPILLARY
GLUCOSE-CAPILLARY: 109 mg/dL — AB (ref 70–99)
GLUCOSE-CAPILLARY: 117 mg/dL — AB (ref 70–99)
Glucose-Capillary: 100 mg/dL — ABNORMAL HIGH (ref 70–99)
Glucose-Capillary: 114 mg/dL — ABNORMAL HIGH (ref 70–99)

## 2014-05-29 LAB — PROTIME-INR
INR: 1.24 (ref 0.00–1.49)
PROTHROMBIN TIME: 15.7 s — AB (ref 11.6–15.2)

## 2014-05-29 MED ORDER — INSULIN DETEMIR 100 UNIT/ML ~~LOC~~ SOLN
20.0000 [IU] | Freq: Every day | SUBCUTANEOUS | Status: DC
  Administered 2014-05-29: 20 [IU] via SUBCUTANEOUS
  Filled 2014-05-29 (×2): qty 0.2

## 2014-05-29 NOTE — Progress Notes (Signed)
EPW's removed per order and unit protocol.  Pt tolerate well.  All tips intact, sites painted.  Bed aware of bedrest for one hour, call bell in reach, family at bedside.  CCMD notified, will monitor closely.

## 2014-05-29 NOTE — Progress Notes (Addendum)
       [address]       [place] [address]             [phone]          7 Days Post-Op Procedure(s) (LRB): CORONARY ARTERY BYPASS GRAFTING (CABG) times three using left internal mammary and right saphenous vein. (N/A)  Subjective: Feels much better today.  Voiding without difficulty since Foley removed.  Had a BM. Gout pain improving with treatment.   Objective: Vital signs in last 24 hours: Patient Vitals for the past 24 hrs:  BP Temp Temp src Pulse Resp SpO2 Weight  05/29/14 0602 (!) 137/56 mmHg 97.6 F (36.4 C) Oral 68 18 97 % 263 lb 3.7 oz (119.4 kg)  05/28/14 1528 (!) 134/59 mmHg 98.5 F (36.9 C) Oral 77 18 98 % -   Current Weight  05/29/14 263 lb 3.7 oz (119.4 kg)  PRE-OPERATIVE WEIGHT: 120 kg   Intake/Output from previous day: 12/27 0701 - 12/28 0700 In: 960 [P.O.:960] Out: 1850 [Urine:1850]  CBGs 100-113-98-109    PHYSICAL EXAM:  Heart: RRR Lungs: Clear Wound: Clean and dry Extremities: Mild LE edema    Lab Results: CBC: Recent Labs  05/27/14 0300 05/28/14 0521  WBC 10.1 8.6  HGB 9.6* 10.1*  HCT 30.5* 31.4*  PLT 183 205   BMET:  Recent Labs  05/28/14 0521 05/29/14 0418  NA 140 139  K 4.6 4.7  CL 105 101  CO2 30 31  GLUCOSE 96 98  BUN 15 15  CREATININE 1.20 1.24  CALCIUM 8.4 8.4    PT/INR:  Recent Labs  05/29/14 0418  LABPROT 15.7*  INR 1.24      Assessment/Plan: S/P Procedure(s) (LRB): CORONARY ARTERY BYPASS GRAFTING (CABG) times three using left internal mammary and right saphenous vein. (N/A)  CV- AF, now maintaining SR.  Continue Coumadin load, Amio, Cardizem, Dig, Lopressor.  BPs generally stable. Not on ACE-I yet due to slightly increased creatinine.  Vol overload- diurese.  Endocrine- A1C=5.8. CBGs stable.  Will wean and d/c Levemir, continue SSI for now.  Will need outpatient follow up.  Gout- continue Colchicine, Indocin.  Urinary retention- voiding without difficulty since Foley  removed. Back on Proscar, Cardura.  D/c EPWs, continue CRPI, pulm toilet.  Possibly home in am if remains stable.   LOS: 7 days    COLLINS,GINA H 05/29/2014  I have seen and examined the patient and agree with the assessment and plan as outlined.  OWEN,CLARENCE H 05/29/2014 9:09 AM

## 2014-05-29 NOTE — Discharge Summary (Signed)
[address]       [place] [address]             [phone]              Discharge Summary  Name: Caleb Wolf DOB: 07-16-1940 73 y.o. MRN: [id number]   Admission Date: 05/22/2014 Discharge Date: 12/292015    Admitting Diagnosis: Chest pain Rapid atrial fibrillation    Discharge Diagnosis:  Severe left main coronary artery disease Non-ST elevation myocardial infarction Rapid atrial fibrillation Expected postoperative blood loss anemia Postoperative ileus Postoperative urinary retention  Past Medical History  Diagnosis Date  . Coronary artery disease   . GERD (gastroesophageal reflux disease)   . Arthritis   . Hyperlipidemia     takes Simvastatin daily  . Carotid stenosis     07/04/11- 50-70%bilaterally- stable per Dr Willey Blade note  . Lower leg edema   . Lumbar disc disease   . Gout     takes Indocin daily as needed  . Enlarged prostate     takes Finasteride daily  . History of kidney stones   . PONV (postoperative nausea and vomiting)     also hard to urinate after surgery  . Sleep apnea     uses CPAP  . Hypertension     takes Clonidine,Verapamil,HCTZ,and Lisinopril daily  . Joint pain   . Chronic back pain   . History of colon polyps   . Urinary urgency       Procedures: EMERGENCY CORONARY ARTERY BYPASS GRAFTING - 05/22/2014  Left internal mammary artery to left anterior descending  Sequential saphenous vein graft to obtuse marginals 1 and 2  Endoscopic vein harvest right thigh    HPI:  The patient is a 73 y.o. male with a history of hypertension, hyperlipidemia ana a recent left carotid endarterectomy who presented [facility] the ER at [facility] on the date of admission with acute onset chest pressure. The patient reports recently developing anginal type symptoms which seemed to be accelerating. He was seen by Dr. Bronson Ing and underwent a pharmacologic nuclear stress test in September which showed no ischemia with normal  EF on echo.  His most recent visit was on 05/12/2014, at which time he was prescribed Imdur and sublingual nitroglycerin, which he has been using frequently.  On the date of admission, he developed acute onset chest pressure which was felt to be more severe than his previous symptoms, and was associated with tingling in his left arm.  EMS was dispatched and noted the patient to be in rapid atrial fibrillation.  He was treated with Diltiazem and aspirin and transported to the ER.  EKG showed diffuse ST segment depression and elevated troponin.  He underwent emergent cardiac catheterization which revealed severe multivessel CAD with 80% ostial left main as well as 99% proximal LAD, 60% ostial dominant circumflex with 70-80% ostial lateral OM.  He had ongoing chest pain and an intra-aortic balloon pump was placed.  TCTS was consulted for emergency CABG. Dr. Roxan Hockey saw the patient and agreed with the need for urgent surgical revascularization. All risks, benefits and alternatives of surgery were explained in detail, and the patient agreed to proceed.     Hospital Course:  The patient was taken to the operating room and underwent the above procedure.  He tolerated the procedure well and was transferred to the ICU for postoperative management.  The patient remained hemodynamically stable and the balloon pump was removed on postop day 1.  His main postoperative issue has been atrial fibrillation, which has been difficult to control. The patient was treated initially with oral Lopressor and IV Amiodarone and Cardizem, but he continued to have elevated heart rates.  He was later started on po Digoxin and Coumadin. He did ultimately convert to normal sinus rhythm and has maintained sinus rhythm since that time. His IV medications were switched to po and he was transferred to the floor on postop day 4.   He also developed urinary retention postoperatively and required reinsertion of his Foley catheter. He was  restarted on his home doses of Cardura and Proscar.  The Foley was removed on postop day 6 and he has successfully been able to void.  He had a mild ileus in the early course which was treated conservatively and resolved. He developed an acute gout flare and was treated with Colchicine and Indocin with improvement of symptoms.  He was noted to have a hemoglobin A1C of 5.8 and was initially maintained on insulin, but blood sugars have been stable and this has been discontinued.  He will need outpatient follow up.  The patient is overall progressing well.  He is tolerating a regular diet and is ambulating in the halls without difficulty.  Incisions are healing well. He remains in sinus rhythm and blood pressures are stable.  The patient has been evaluated on today's date and is medically stable for discharge home.     Recent vital signs:  Filed Vitals:   05/29/14 2141  BP: 118/46  Pulse: 57  Temp: 97.8 F (36.6 C)  Resp: 20    Recent laboratory studies:  CBC:  Recent Labs  05/28/14 0521  WBC 8.6  HGB 10.1*  HCT 31.4*  PLT 205   BMET:   Recent Labs  05/28/14 0521 05/29/14 0418 05/30/14 0720  NA 140 139  --   K 4.6 4.7  --   CL 105 101  --   CO2 30 31  --   GLUCOSE 96 98  --   BUN 15 15  --   CREATININE 1.20 1.24 1.39*  CALCIUM 8.4 8.4  --     PT/INR:   Recent Labs  05/30/14 0720  LABPROT 16.0*  INR 1.27     Discharge Medications:     Medication List    STOP taking these medications        ALEVE 220 MG tablet  Generic drug:  naproxen sodium     cloNIDine 0.3 MG tablet  Commonly known as:  CATAPRES     hydrochlorothiazide 25 MG tablet  Commonly known as:  HYDRODIURIL     isosorbide mononitrate 30 MG 24 hr tablet  Commonly known as:  IMDUR     nitroGLYCERIN 0.4 MG SL tablet  Commonly known as:  NITROSTAT     simvastatin 10 MG tablet  Commonly known as:  ZOCOR     verapamil 80 MG tablet  Commonly known as:  CALAN      TAKE these medications          amiodarone 400 MG tablet  Commonly known as:  PACERONE  Take 1 tablet (400 mg total) by mouth every 12 (twelve) hours.     aspirin 81 MG tablet  Take 81 mg by mouth daily.     atorvastatin 40 MG tablet  Commonly known as:  LIPITOR  Take 1 tablet (40 mg total) by mouth daily.     colchicine 0.6 MG tablet  Take 1 tablet (0.6  mg total) by mouth daily. X 5 days     digoxin 0.125 MG tablet  Commonly known as:  LANOXIN  Take 1 tablet (0.125 mg total) by mouth daily.     diltiazem 60 MG tablet  Commonly known as:  CARDIZEM  Take 1 tablet (60 mg total) by mouth every 8 (eight) hours.     doxazosin 2 MG tablet  Commonly known as:  CARDURA  Take 2 mg by mouth daily.     finasteride 5 MG tablet  Commonly known as:  PROSCAR  Take 5 mg by mouth at bedtime.     furosemide 40 MG tablet  Commonly known as:  LASIX  Take 1 tablet (40 mg total) by mouth daily. X 5 days     indomethacin 50 MG capsule  Commonly known as:  INDOCIN  Take 50 mg by mouth 2 (two) times daily as needed (for gout attack). Will take for 2-3 days during attack     lisinopril 2.5 MG tablet  Commonly known as:  PRINIVIL,ZESTRIL  Take 1 tablet (2.5 mg total) by mouth daily.     metoprolol tartrate 25 MG tablet  Commonly known as:  LOPRESSOR  Take 1 tablet (25 mg total) by mouth 2 (two) times daily.     oxyCODONE 5 MG immediate release tablet  Commonly known as:  ROXICODONE  Take 1-2 tablets (5-10 mg total) by mouth every 3 (three) hours as needed for severe pain.     potassium chloride SA 20 MEQ tablet  Commonly known as:  K-DUR,KLOR-CON  Take 1 tablet (20 mEq total) by mouth daily. X 5 days     TESTIM 50 MG/5GM (1%) Gel  Generic drug:  testosterone  Apply 2 clicks as needed up three times weekly     warfarin 5 MG tablet  Commonly known as:  COUMADIN  Take 1 tablet (5 mg total) by mouth daily. Or as directed by Coumadin Clinic         Discharge Instructions:  The patient is to refrain from  driving, heavy lifting or strenuous activity.  May shower daily and clean incisions with soap and water.  May resume regular diet.   Follow Up: Follow-up Information    Follow up with Herminio Commons, MD On 06/08/2014.   Specialty:  Cardiology   Why:  Appointment is at 4:20 pm   Contact information:   [place] [address] [phone]       Follow up with Melrose Nakayama, MD.   Specialty:  Cardiothoracic Surgery   Why:  Office will contact you with an appointment   Contact information:   [address] [place] [address] [phone]       Follow up On 05/31/2014.   Why:  Coumadin clinic on Wed 12/30 @1 :10pm ([place])       Follow up with Asencion Noble, MD.   Specialty:  Internal Medicine   Why:  Please follow up with your primary MD to monitor pre-diabetes (A1C=5.8)   Contact information:   [place] [address] [place] [address] [phone]         The patient has been discharged on:  1.Beta Blocker: Yes [ x ]  No [ ]   If No, reason:    2.Ace Inhibitor/ARB: Yes [ x ]  No [  ]  If No, reason:    3.Statin: Yes [ x ]  No [ ]   If No, reason:  4.Ecasa: Yes [ x ]  No [ ]   If No, reason:     Yitzhak Awan H 05/30/2014, 9:13 AM

## 2014-05-29 NOTE — Progress Notes (Signed)
RN removed pt chest tube sutures per order. Pt tolerated procedure well and is currently resting in bed with call bell within reach. Pt suture sites are clean, dry and intact with no drainage. Will continue to monitor.   Haseeb Fiallos, Yisroel Ramming, RN

## 2014-05-29 NOTE — Care Management Note (Signed)
    Page 1 of 1   05/29/2014     3:08:11 PM CARE MANAGEMENT NOTE 05/29/2014  Patient:  Caleb Wolf, Caleb Wolf   Account Number:  [id number]  Date Initiated:  05/23/2014  Documentation initiated by:  [facility]  Subjective/Objective Assessment:   Emergent CABG x3     Action/Plan:   Anticipated DC Date:  05/26/2014   Anticipated DC Plan:  [place]  CM consult      Choice offered to / List presented to:             Status of service:  In process, will continue to follow Medicare Important Message given?  YES (If response is "NO", the following Medicare IM given date fields will be blank) Date Medicare IM given:  05/29/2014 Medicare IM given by:  [facility] Date Additional Medicare IM given:   Additional Medicare IM given by:    Discharge Disposition:    Per UR Regulation:  Reviewed for med. necessity/level of care/duration of stay  If discussed at [place] of Stay Meetings, dates discussed:    Comments:  [place], [place] [phone]  [phone]  05-29-14  [place], [place] [phone] Plan for dc home tomorrow.  Talked with wife and patient. No needs identified at this time.

## 2014-05-29 NOTE — Progress Notes (Signed)
CARDIAC REHAB PHASE I   PRE:  Rate/Rhythm: 65 SR  BP:  Supine: 120/57  Sitting:   Standing:    SaO2: 95 RA  MODE:  Ambulation: 550 ft   POST:  Rate/Rhythm: 91 SR  BP:  Supine:   Sitting: 145/80  Standing:    SaO2: 95 RA [id number] Assisted X 1 and used walker to ambulate. Pt able to walk 550 feet, only c/o was of some gout pain in his left foot. He states that it is better than yesterday. VS stable Pt back to bed after walk to have pacing wires pulled out. We discussed Outpt. CRP. He and his wife are discussing [place] or [place]. They will let us know their decision tomorrow. I instructed them to watch [facility] discharge video today.  Rodney Langton RN 05/29/2014 9:45 AM

## 2014-05-30 LAB — GLUCOSE, CAPILLARY
GLUCOSE-CAPILLARY: 102 mg/dL — AB (ref 70–99)
Glucose-Capillary: 91 mg/dL (ref 70–99)

## 2014-05-30 LAB — CREATININE, SERUM
Creatinine, Ser: 1.39 mg/dL — ABNORMAL HIGH (ref 0.50–1.35)
GFR calc non Af Amer: 49 mL/min — ABNORMAL LOW (ref >90)
GFR, EST AFRICAN AMERICAN: 57 mL/min — AB (ref >90)

## 2014-05-30 LAB — PROTIME-INR
INR: 1.27 (ref 0.00–1.49)
Prothrombin Time: 16 seconds — ABNORMAL HIGH (ref 11.6–15.2)

## 2014-05-30 MED ORDER — LISINOPRIL 2.5 MG PO TABS: 2.5000 mg | ORAL_TABLET | Freq: Every day | ORAL | Status: DC

## 2014-05-30 MED ORDER — COLCHICINE 0.6 MG PO TABS: 0.6000 mg | ORAL_TABLET | Freq: Every day | ORAL | Status: DC

## 2014-05-30 MED ORDER — WARFARIN SODIUM 5 MG PO TABS: 5.0000 mg | ORAL_TABLET | Freq: Every day | ORAL | Status: DC

## 2014-05-30 MED ORDER — AMIODARONE HCL 400 MG PO TABS: 400.0000 mg | ORAL_TABLET | Freq: Two times a day (BID) | ORAL | Status: DC

## 2014-05-30 MED ORDER — METOPROLOL TARTRATE 25 MG PO TABS: 25.0000 mg | ORAL_TABLET | Freq: Two times a day (BID) | ORAL | Status: DC

## 2014-05-30 MED ORDER — LISINOPRIL 2.5 MG PO TABS
2.5000 mg | ORAL_TABLET | Freq: Every day | ORAL | Status: DC
  Administered 2014-05-30: 2.5 mg via ORAL
  Filled 2014-05-30: qty 1

## 2014-05-30 MED ORDER — POTASSIUM CHLORIDE CRYS ER 20 MEQ PO TBCR
20.0000 meq | EXTENDED_RELEASE_TABLET | Freq: Every day | ORAL | Status: DC
Start: 2014-05-30 — End: 2014-06-08

## 2014-05-30 MED ORDER — DILTIAZEM HCL 60 MG PO TABS: 60.0000 mg | ORAL_TABLET | Freq: Three times a day (TID) | ORAL | Status: DC

## 2014-05-30 MED ORDER — OXYCODONE HCL 5 MG PO TABS: 5.0000 mg | ORAL_TABLET | ORAL | Status: DC | PRN

## 2014-05-30 MED ORDER — DIGOXIN 125 MCG PO TABS: 0.1250 mg | ORAL_TABLET | Freq: Every day | ORAL | Status: DC

## 2014-05-30 MED ORDER — ATORVASTATIN CALCIUM 40 MG PO TABS: 40.0000 mg | ORAL_TABLET | Freq: Every day | ORAL | Status: DC

## 2014-05-30 MED ORDER — FUROSEMIDE 40 MG PO TABS: 40.0000 mg | ORAL_TABLET | Freq: Every day | ORAL | Status: DC

## 2014-05-30 NOTE — Progress Notes (Signed)
Pt and wife given d/c instructions; both verbalized understanding; IV and tele monitor d/c at this time; pt to d/c home with wife; will cont. To monitor.

## 2014-05-30 NOTE — Progress Notes (Signed)
[id number] Cardiac Rehab Completed discharge education with pt and wife. They voice understanding. Pt agrees to [place]. CRP in [place] or [place]. He will decide later, will send referral to both places. Deon Pilling, RN 05/30/2014 12:09 PM

## 2014-05-30 NOTE — Progress Notes (Addendum)
       [address]       [place], [address]             [phone]          8 Days Post-Op Procedure(s) (LRB): CORONARY ARTERY BYPASS GRAFTING (CABG) times three using left internal mammary and right saphenous vein. (N/A)  Subjective: Feels well, no complaints.   Objective: Vital signs in last 24 hours: Patient Vitals for the past 24 hrs:  BP Temp Temp src Pulse Resp SpO2 Weight  05/30/14 0500 - - - - - - 261 lb 11 oz (118.7 kg)  05/29/14 2141 (!) 118/46 mmHg 97.8 F (36.6 C) Oral (!) 57 20 96 % -  05/29/14 1445 (!) 125/45 mmHg 97.8 F (36.6 C) Oral 70 18 95 % -  05/29/14 1200 (!) 141/60 mmHg - - 70 - - -  05/29/14 1145 130/61 mmHg - - - - - -  05/29/14 1130 (!) 135/50 mmHg - - - - - -  05/29/14 1108 (!) 139/49 mmHg - - - - - -   Current Weight  05/30/14 261 lb 11 oz (118.7 kg)  PRE-OPERATIVE WEIGHT: 120 kg   Intake/Output from previous day: 12/28 0701 - 12/29 0700 In: 660 [P.O.:660] Out: 875 [Urine:875]  CBGs 117-102-91    PHYSICAL EXAM:  Heart: RRR, brady 59 Lungs: Clear Wound: Clean and dry Extremities: Trace LE edema    Lab Results: CBC:  Recent Labs  05/28/14 0521  WBC 8.6  HGB 10.1*  HCT 31.4*  PLT 205   BMET:   Recent Labs  05/28/14 0521 05/29/14 0418  NA 140 139  K 4.6 4.7  CL 105 101  CO2 30 31  GLUCOSE 96 98  BUN 15 15  CREATININE 1.20 1.24  CALCIUM 8.4 8.4    PT/INR:   Recent Labs  05/29/14 0418  LABPROT 15.7*  INR 1.24      Assessment/Plan: S/P Procedure(s) (LRB): CORONARY ARTERY BYPASS GRAFTING (CABG) times three using left internal mammary and right saphenous vein. (N/A)  CV- AF, now maintaining SR. Continue Coumadin, Amio,Cardizem, Dig, Lopressor. BPs trending up. Cr stable, will hopefully be able to resume low dose ACE-I at discharge.  Vol overload- Continue diuresis.  Endocrine- A1C=5.8. CBGs stable. Will need outpatient follow up.  Gout- continue Colchicine, Indocin.  Urinary  retention- voiding without difficulty since Foley removed. Back on Proscar, Cardura.  PT/INR pending.  Will follow up and plan d/c home later this am after labs are back.   LOS: 8 days    COLLINS,GINA H 05/30/2014  I have seen and examined the patient and agree with the assessment and plan as outlined.  INR increased slightly to 1.27 this morning.  Patient is maintaining sinus rhythm so I think it's okay for d/c and let INR slowly drift up.  Probably could try coumadin 7.5 mg alternating w/ 5 mg  Maleka Contino H 05/30/2014 8:36 AM

## 2014-05-30 NOTE — Progress Notes (Signed)
Subjective:  Feels ready to go home. Talking with Lattie Haw of cardiac rehab.   Objective:  Vital Signs in the last 24 hours: Temp:  [97.8 F (36.6 C)] 97.8 F (36.6 C) (12/28 2141) Pulse Rate:  [57-70] 64 (12/29 1015) Resp:  [18-20] 20 (12/28 2141) BP: (118-141)/(40-61) 131/40 mmHg (12/29 1015) SpO2:  [95 %-96 %] 96 % (12/28 2141) Weight:  [261 lb 11 oz (118.7 kg)] 261 lb 11 oz (118.7 kg) (12/29 0500)  Intake/Output from previous day: 12/28 0701 - 12/29 0700 In: 22 [P.O.:660] Out: 875 [Urine:875]   Physical Exam: General: Well developed, well nourished, in no acute distress. Head:  Normocephalic and atraumatic. Lungs: Clear to auscultation and percussion. Heart: Normal S1 and S2.  No murmur, rubs or gallops. Wound clear. Abdomen: soft, non-tender, positive bowel sounds. Obese Extremities: Mild edema. Neurologic: Alert and oriented x 3.    Lab Results:  Recent Labs  05/28/14 0521  WBC 8.6  HGB 10.1*  PLT 205    Recent Labs  05/28/14 0521 05/29/14 0418 05/30/14 0720  NA 140 139  --   K 4.6 4.7  --   CL 105 101  --   CO2 30 31  --   GLUCOSE 96 98  --   BUN 15 15  --   CREATININE 1.20 1.24 1.39*     Telemetry:NSR Personally viewed.   Scheduled Meds: . amiodarone  400 mg Oral Q12H   Followed by  . [START ON 06/09/2014] amiodarone  400 mg Oral Daily  . aspirin EC  81 mg Oral Daily   Or  . aspirin  81 mg Per Tube Daily  . atorvastatin  40 mg Oral q1800  . bisacodyl  10 mg Oral Daily   Or  . bisacodyl  10 mg Rectal Daily  . colchicine  0.6 mg Oral Daily  . digoxin  0.125 mg Oral Daily  . diltiazem  60 mg Oral 3 times per day  . docusate sodium  200 mg Oral Daily  . doxazosin  2 mg Oral Daily  . enoxaparin (LOVENOX) injection  40 mg Subcutaneous QHS  . finasteride  5 mg Oral QHS  . furosemide  40 mg Oral Daily  . insulin aspart  0-15 Units Subcutaneous TID WC  . lisinopril  2.5 mg Oral Daily  . metoprolol tartrate  25 mg Oral BID   Or  .  metoprolol tartrate  25 mg Per Tube BID  . pantoprazole  40 mg Oral Daily  . potassium chloride  40 mEq Oral BID  . sodium chloride  3 mL Intravenous Q12H  . warfarin  5 mg Oral q1800  . Warfarin - Physician Dosing Inpatient   Does not apply q1800   Continuous Infusions:  PRN Meds:.sodium chloride, alum & mag hydroxide-simeth, guaiFENesin-dextromethorphan, magnesium hydroxide, ondansetron (ZOFRAN) IV, oxyCODONE, sodium chloride, traMADol  Assessment/Plan:  Principal Problem:   Atrial fibrillation with RVR Active Problems:   Chest pain   Abnormal EKG   NSTEMI (non-ST elevated myocardial infarction)   Carotid artery stenosis without cerebral infarction   S/P CABG x 15   73 year old with NSTEMI, CAD post CABG, post op AFIB, obesity, gout.   AFIB, parox - on AMIO, hopefully short term. Other meds reviewed.  - agree with letting INR increase gradually with coumadin.  - Dr. Bronson Ing, [place] is his cardiologist. Has close follow up with his coumadin clinic arranged.   Gout - try to avoid NSAIDs (indocin) with warfarin -  OK with colchicine as needed.     SKAINS, [place] 05/30/2014, 11:41 AM

## 2014-05-31 ENCOUNTER — Ambulatory Visit (INDEPENDENT_AMBULATORY_CARE_PROVIDER_SITE_OTHER): Payer: Medicare Other | Admitting: *Deleted

## 2014-05-31 DIAGNOSIS — I4891 Unspecified atrial fibrillation: Secondary | ICD-10-CM

## 2014-05-31 DIAGNOSIS — Z5181 Encounter for therapeutic drug level monitoring: Secondary | ICD-10-CM | POA: Insufficient documentation

## 2014-05-31 LAB — POCT INR: INR: 1.4

## 2014-06-05 ENCOUNTER — Ambulatory Visit (INDEPENDENT_AMBULATORY_CARE_PROVIDER_SITE_OTHER): Payer: Medicare Other | Admitting: *Deleted

## 2014-06-05 DIAGNOSIS — Z5181 Encounter for therapeutic drug level monitoring: Secondary | ICD-10-CM | POA: Diagnosis not present

## 2014-06-05 DIAGNOSIS — I4891 Unspecified atrial fibrillation: Secondary | ICD-10-CM | POA: Diagnosis not present

## 2014-06-05 LAB — POCT INR: INR: 2.2

## 2014-06-08 ENCOUNTER — Encounter: Payer: Self-pay | Admitting: Cardiovascular Disease

## 2014-06-08 ENCOUNTER — Ambulatory Visit (INDEPENDENT_AMBULATORY_CARE_PROVIDER_SITE_OTHER): Payer: Medicare Other | Admitting: Cardiovascular Disease

## 2014-06-08 VITALS — BP 154/70 | HR 60 | Ht 70.0 in | Wt 249.2 lb

## 2014-06-08 DIAGNOSIS — I868 Varicose veins of other specified sites: Secondary | ICD-10-CM | POA: Diagnosis not present

## 2014-06-08 DIAGNOSIS — E785 Hyperlipidemia, unspecified: Secondary | ICD-10-CM | POA: Diagnosis not present

## 2014-06-08 DIAGNOSIS — I25810 Atherosclerosis of coronary artery bypass graft(s) without angina pectoris: Secondary | ICD-10-CM | POA: Diagnosis not present

## 2014-06-08 DIAGNOSIS — I10 Essential (primary) hypertension: Secondary | ICD-10-CM | POA: Diagnosis not present

## 2014-06-08 DIAGNOSIS — Z9289 Personal history of other medical treatment: Secondary | ICD-10-CM

## 2014-06-08 DIAGNOSIS — Z87898 Personal history of other specified conditions: Secondary | ICD-10-CM | POA: Diagnosis not present

## 2014-06-08 DIAGNOSIS — Z5181 Encounter for therapeutic drug level monitoring: Secondary | ICD-10-CM | POA: Diagnosis not present

## 2014-06-08 DIAGNOSIS — I8390 Asymptomatic varicose veins of unspecified lower extremity: Secondary | ICD-10-CM

## 2014-06-08 DIAGNOSIS — Z951 Presence of aortocoronary bypass graft: Secondary | ICD-10-CM

## 2014-06-08 DIAGNOSIS — I214 Non-ST elevation (NSTEMI) myocardial infarction: Secondary | ICD-10-CM

## 2014-06-08 DIAGNOSIS — Z9889 Other specified postprocedural states: Secondary | ICD-10-CM

## 2014-06-08 DIAGNOSIS — I4891 Unspecified atrial fibrillation: Secondary | ICD-10-CM | POA: Diagnosis not present

## 2014-06-08 MED ORDER — COLCHICINE 0.6 MG PO TABS: 0.6000 mg | ORAL_TABLET | Freq: Every day | ORAL | Status: DC

## 2014-06-08 MED ORDER — LISINOPRIL 5 MG PO TABS: 5.0000 mg | ORAL_TABLET | Freq: Every day | ORAL | Status: DC

## 2014-06-08 MED ORDER — AMIODARONE HCL 400 MG PO TABS: ORAL_TABLET | ORAL | Status: DC

## 2014-06-08 NOTE — Progress Notes (Signed)
Patient ID: Caleb Wolf, male   DOB: September 08, 1940, 74 y.o.   MRN: [id number]      SUBJECTIVE: The patient returns after being hospitalized for a non-STEMI and was found to have severe multivessel CAD requiring emergent CABG with a LIMA to LAD and sequential SVG's to OM1 and OM2. He also developed atrial fibrillation with a rapid ventricular response. Coronary angiography demonstrated a heavily calcified ostial left main with 80% stenosis and pressure dampening. The proximal LAD had a 99% stenosis with a small first diagonal branch that also had a 90% stenosis. The left circumflex coronary artery had an ostial 60% stenosis which had a large lateral OM branch which had a 70-80% stenosis. The RCA was small caliber and nondominant. He was started on amiodarone and warfarin.  While he is somewhat fatigued. He is otherwise feeling well. He has yet to meet with the cardiac rehabilitation staff which is set for January 12. He will also be enrolled in the anticoagulation clinic on January 11th.    Review of Systems: As per "subjective", otherwise negative.  Allergies  Allergen Reactions  . Codeine Sulfate Nausea Only    Current Outpatient Prescriptions  Medication Sig Dispense Refill  . amiodarone (PACERONE) 400 MG tablet Take 1 tablet (400 mg total) by mouth every 12 (twelve) hours. 60 tablet 2  . aspirin 81 MG tablet Take 81 mg by mouth daily.    Marland Kitchen atorvastatin (LIPITOR) 40 MG tablet Take 1 tablet (40 mg total) by mouth daily. 30 tablet 1  . colchicine 0.6 MG tablet Take 1 tablet (0.6 mg total) by mouth daily. X 5 days 5 tablet 0  . digoxin (LANOXIN) 0.125 MG tablet Take 1 tablet (0.125 mg total) by mouth daily. 30 tablet 1  . diltiazem (CARDIZEM) 60 MG tablet Take 1 tablet (60 mg total) by mouth every 8 (eight) hours. 90 tablet 1  . doxazosin (CARDURA) 2 MG tablet Take 2 mg by mouth daily.    . finasteride (PROSCAR) 5 MG tablet Take 5 mg by mouth at bedtime.     Marland Kitchen lisinopril (PRINIVIL,ZESTRIL)  2.5 MG tablet Take 1 tablet (2.5 mg total) by mouth daily. 30 tablet 1  . metoprolol tartrate (LOPRESSOR) 25 MG tablet Take 1 tablet (25 mg total) by mouth 2 (two) times daily. 60 tablet 1  . oxyCODONE (ROXICODONE) 5 MG immediate release tablet Take 1-2 tablets (5-10 mg total) by mouth every 3 (three) hours as needed for severe pain. 30 tablet 0  . TESTIM 50 QI/3KV GEL Apply 2 clicks as needed up three times weekly    . warfarin (COUMADIN) 5 MG tablet Take 1 tablet (5 mg total) by mouth daily. Or as directed by Coumadin Clinic 60 tablet 1   No current facility-administered medications for this visit.    Past Medical History  Diagnosis Date  . Coronary artery disease   . GERD (gastroesophageal reflux disease)   . Arthritis   . Hyperlipidemia     takes Simvastatin daily  . Carotid stenosis     07/04/11- 50-70%bilaterally- stable per Dr Willey Blade note  . Lower leg edema   . Lumbar disc disease   . Gout     takes Indocin daily as needed  . Enlarged prostate     takes Finasteride daily  . History of kidney stones   . PONV (postoperative nausea and vomiting)     also hard to urinate after surgery  . Sleep apnea     uses CPAP  .  Hypertension     takes Clonidine,Verapamil,HCTZ,and Lisinopril daily  . Joint pain   . Chronic back pain   . History of colon polyps   . Urinary urgency     Past Surgical History  Procedure Laterality Date  . Fracture surgery      bilateral wrist fractures- one ORIF  . Rhinoplasty    . Joint replacement  2011    left knee  . Eye surgery      cataract extraction with repair macular tear , with IOL     right  . Total hip arthroplasty  12/08/2011    Procedure: TOTAL HIP ARTHROPLASTY;  Surgeon: Gearlean Alf, MD;  Location: [facility];  Service: Orthopedics;  Laterality: Right;  . Tonsillectomy    . Wrist surgery Right 21yrs ago  . Lithotripsy    . Back surgery    . Wrist surgery Left   . Cataract surgery Right   . Colonoscopy    . Pars plana vitrectomy w/  repair of macular hole    . Endarterectomy Left 02/17/2014    Procedure: ENDARTERECTOMY CAROTID WITH PATCH ANGIOPLASTY;  Surgeon: Mal Misty, MD;  Location: [place];  Service: Vascular;  Laterality: Left;  . Left heart catheterization with coronary angiogram N/A 05/22/2014    Procedure: LEFT HEART CATHETERIZATION WITH CORONARY ANGIOGRAM;  Surgeon: Leonie Man, MD;  Location: [facility] CATH LAB;  Service: Cardiovascular;  Laterality: N/A;  . Cardiac catheterization  05/22/2014    Procedure: IABP INSERTION;  Surgeon: Leonie Man, MD;  Location: [facility] CATH LAB;  Service: Cardiovascular;;  . Coronary artery bypass graft N/A 05/22/2014    Procedure: CORONARY ARTERY BYPASS GRAFTING (CABG) times three using left internal mammary and right saphenous vein.;  Surgeon: Melrose Nakayama, MD;  Location: [place];  Service: Open Heart Surgery;  Laterality: N/A;    History   Social History  . Marital Status: Married    Spouse Name: N/A    Number of Children: N/A  . Years of Education: N/A   Occupational History  . retired     [employer]   Social History Main Topics  . Smoking status: Former Smoker -- 2.00 packs/day for 25 years    Types: Cigarettes    Start date: 08/08/1960    Quit date: 06/02/1982  . Smokeless tobacco: Never Used     Comment: quit smoking 30+yrs ago  . Alcohol Use: 0.0 oz/week    0 Not specified per week     Comment: occasionally   . Drug Use: No  . Sexual Activity: Yes   Other Topics Concern  . Not on file   Social History Narrative     Filed Vitals:   06/08/14 1635  BP: 154/70  Pulse: 60  Height: 5\' 10"  (1.778 m)  Weight: 249 lb 3.2 oz (113.036 kg)  SpO2: 97%    PHYSICAL EXAM General: NAD HEENT: Normal. Neck: No JVD, no thyromegaly. Lungs: Clear to auscultation bilaterally with normal respiratory effort. CV: Nondisplaced PMI.  Well healed midline scar. Regular rate and rhythm, normal S1/S2, no S3/S4, no murmur. No pretibial or periankle  edema.  Abdomen: Soft, nontender, obese, no distention.  Neurologic: Alert and oriented x 3.  Psych: Normal affect. Skin: Normal. Musculoskeletal: No gross deformities. Extremities: No clubbing or cyanosis.   ECG: Most recent ECG reviewed.    ASSESSMENT AND PLAN: 1. CAD with recent CABG: Symptomatically stable. Will commence cardiac rehabilitation in the near future. Continue therapy with aspirin 81  mg, metoprolol 25 mg twice daily, and Lipitor 40 mg. 2. Essential HTN: Mildly elevated today. Will increase lisinopril to 5 mg daily. 3. Hyperlipidemia: Lipids on 05/25/2014 demonstrated total cholesterol 116, triglycerides 113, HDL 38, LDL 55. Continue Lipitor 40 mg daily.  4. Carotid artery stenosis s/p left CEA: Stable. Continue ASA and statin.  5. Sleep apnea: Uses CPAP.  6. Leg swelling/venous varicosities: Continue use of compression stockings. 7. Atrial fibrillation: Currently in a regular rhythm. He is currently taking amiodarone 400 mg twice daily. I will reduce this to 400 mg daily for one week followed by 200 mg daily until his next visit with me. For the time being, I will continue warfarin.   Dispo: f/u 4-6 weeks.  Time spent: 40 minutes, of which >50% spent reviewing details of his coronary anatomy and surgery, as well as post-operative management with the patient and his wife.   Kate Sable, [name], F.A.C.C.

## 2014-06-08 NOTE — Patient Instructions (Signed)
Your physician recommends that you schedule a follow-up appointment in: 6 weeks with Dr. Bronson Ing  Your physician has recommended you make the following change in your medication:   DECREASE AMIODARONE TO 400 MG DAILY FOR 1 WEEK THEN TAKE 200 MG DAILY UNTIL YOUR NEXT OFFICE VISIT  INCREASE LISINOPRIL TO 5 MG DAILY  WE REFILLED COLCHICINE  Thank you for choosing [facility]!!

## 2014-06-12 ENCOUNTER — Ambulatory Visit (INDEPENDENT_AMBULATORY_CARE_PROVIDER_SITE_OTHER): Payer: Medicare Other | Admitting: *Deleted

## 2014-06-12 DIAGNOSIS — Z5181 Encounter for therapeutic drug level monitoring: Secondary | ICD-10-CM | POA: Diagnosis not present

## 2014-06-12 DIAGNOSIS — I4891 Unspecified atrial fibrillation: Secondary | ICD-10-CM | POA: Diagnosis not present

## 2014-06-12 LAB — POCT INR: INR: 1.9

## 2014-06-13 ENCOUNTER — Encounter (HOSPITAL_COMMUNITY)
Admission: RE | Admit: 2014-06-13 | Discharge: 2014-06-13 | Disposition: A | Discharge status: Home / Self Care | Payer: Medicare Other | Source: Ambulatory Visit | Attending: Cardiovascular Disease | Admitting: Cardiovascular Disease

## 2014-06-13 VITALS — BP 124/60 | HR 53 | Ht 70.0 in | Wt 251.8 lb

## 2014-06-13 DIAGNOSIS — Z951 Presence of aortocoronary bypass graft: Secondary | ICD-10-CM | POA: Insufficient documentation

## 2014-06-13 DIAGNOSIS — I214 Non-ST elevation (NSTEMI) myocardial infarction: Secondary | ICD-10-CM

## 2014-06-13 DIAGNOSIS — I4891 Unspecified atrial fibrillation: Secondary | ICD-10-CM | POA: Diagnosis not present

## 2014-06-13 DIAGNOSIS — I2510 Atherosclerotic heart disease of native coronary artery without angina pectoris: Secondary | ICD-10-CM | POA: Diagnosis not present

## 2014-06-13 NOTE — Patient Instructions (Signed)
Pt has finished orientation and is scheduled to start CR on 06/26/14 at 11 am. Pt has been instructed to arrive to class 15 minutes early for scheduled class. Pt has been instructed to wear comfortable clothing and shoes with rubber soles. Pt has been told to take their medications 1 hour prior to coming to class.  If the patient is not going to attend class, he/she has been instructed to call.

## 2014-06-13 NOTE — Progress Notes (Signed)
Patient referred due to post CABGx3 Z95.1and MI I21.4. During orientation advised patient on arrival and appointment times what to wear, what to do before, during and after exercise. Reviewed attendance and class policy. Talked about inclement weather and class consultation policy. Pt is scheduled to start Cardiac Rehab on 06/26/14 at 11 am. Pt was advised to come to class 5 minutes before class starts. He was also given instructions on meeting with the dietician and attending the Family Structure classes. Pt is eager to get started. Patient was able to complete 6 minute walk test.

## 2014-06-19 ENCOUNTER — Telehealth: Payer: Self-pay | Admitting: *Deleted

## 2014-06-19 NOTE — Telephone Encounter (Signed)
Pt wants to travel 2 hrs to see son.I told him to call surgeon and they told him to call us. He will visit his son

## 2014-06-19 NOTE — Telephone Encounter (Signed)
Pt wants to know if he is able to travel?

## 2014-06-21 ENCOUNTER — Ambulatory Visit (INDEPENDENT_AMBULATORY_CARE_PROVIDER_SITE_OTHER): Payer: Medicare Other | Admitting: *Deleted

## 2014-06-21 ENCOUNTER — Encounter: Payer: Self-pay | Admitting: *Deleted

## 2014-06-21 DIAGNOSIS — Z5181 Encounter for therapeutic drug level monitoring: Secondary | ICD-10-CM | POA: Diagnosis not present

## 2014-06-21 DIAGNOSIS — I4891 Unspecified atrial fibrillation: Secondary | ICD-10-CM

## 2014-06-21 LAB — POCT INR: INR: 2.3

## 2014-06-26 ENCOUNTER — Encounter (HOSPITAL_COMMUNITY)
Admission: RE | Admit: 2014-06-26 | Discharge: 2014-06-26 | Disposition: A | Discharge status: Home / Self Care | Payer: Medicare Other | Source: Ambulatory Visit | Attending: Cardiovascular Disease | Admitting: Cardiovascular Disease

## 2014-06-26 DIAGNOSIS — I214 Non-ST elevation (NSTEMI) myocardial infarction: Secondary | ICD-10-CM | POA: Diagnosis not present

## 2014-06-26 DIAGNOSIS — Z951 Presence of aortocoronary bypass graft: Secondary | ICD-10-CM | POA: Diagnosis not present

## 2014-06-28 ENCOUNTER — Encounter (HOSPITAL_COMMUNITY)
Admission: RE | Admit: 2014-06-28 | Discharge: 2014-06-28 | Disposition: A | Discharge status: Home / Self Care | Payer: Medicare Other | Source: Ambulatory Visit | Attending: Cardiovascular Disease | Admitting: Cardiovascular Disease

## 2014-06-28 ENCOUNTER — Other Ambulatory Visit: Payer: Self-pay | Admitting: Thoracic Surgery (Cardiothoracic Vascular Surgery)

## 2014-06-28 DIAGNOSIS — I214 Non-ST elevation (NSTEMI) myocardial infarction: Secondary | ICD-10-CM

## 2014-06-28 DIAGNOSIS — Z951 Presence of aortocoronary bypass graft: Secondary | ICD-10-CM | POA: Diagnosis not present

## 2014-06-30 ENCOUNTER — Encounter (HOSPITAL_COMMUNITY)
Admission: RE | Admit: 2014-06-30 | Discharge: 2014-06-30 | Disposition: A | Discharge status: Home / Self Care | Payer: Medicare Other | Source: Ambulatory Visit | Attending: Cardiovascular Disease | Admitting: Cardiovascular Disease

## 2014-06-30 DIAGNOSIS — I214 Non-ST elevation (NSTEMI) myocardial infarction: Secondary | ICD-10-CM | POA: Diagnosis not present

## 2014-06-30 DIAGNOSIS — Z951 Presence of aortocoronary bypass graft: Secondary | ICD-10-CM | POA: Diagnosis not present

## 2014-07-03 ENCOUNTER — Ambulatory Visit
Admission: RE | Admit: 2014-07-03 | Discharge: 2014-07-03 | Disposition: A | Discharge status: Home / Self Care | Payer: Medicare Other | Source: Ambulatory Visit | Attending: Thoracic Surgery (Cardiothoracic Vascular Surgery) | Admitting: Thoracic Surgery (Cardiothoracic Vascular Surgery)

## 2014-07-03 ENCOUNTER — Ambulatory Visit (INDEPENDENT_AMBULATORY_CARE_PROVIDER_SITE_OTHER): Payer: Medicare Other | Admitting: *Deleted

## 2014-07-03 ENCOUNTER — Ambulatory Visit (INDEPENDENT_AMBULATORY_CARE_PROVIDER_SITE_OTHER): Payer: Self-pay | Admitting: Surgical

## 2014-07-03 ENCOUNTER — Ambulatory Visit: Payer: Medicare Other

## 2014-07-03 ENCOUNTER — Encounter (HOSPITAL_COMMUNITY)
Admission: RE | Admit: 2014-07-03 | Discharge: 2014-07-03 | Disposition: A | Discharge status: Home / Self Care | Payer: Medicare Other | Source: Ambulatory Visit | Attending: Cardiovascular Disease | Admitting: Cardiovascular Disease

## 2014-07-03 VITALS — BP 106/49 | HR 54 | Resp 16 | Ht 69.5 in | Wt 248.0 lb

## 2014-07-03 DIAGNOSIS — I4891 Unspecified atrial fibrillation: Secondary | ICD-10-CM

## 2014-07-03 DIAGNOSIS — Z5181 Encounter for therapeutic drug level monitoring: Secondary | ICD-10-CM | POA: Diagnosis not present

## 2014-07-03 DIAGNOSIS — Z951 Presence of aortocoronary bypass graft: Secondary | ICD-10-CM | POA: Diagnosis not present

## 2014-07-03 DIAGNOSIS — I214 Non-ST elevation (NSTEMI) myocardial infarction: Secondary | ICD-10-CM | POA: Insufficient documentation

## 2014-07-03 DIAGNOSIS — I2510 Atherosclerotic heart disease of native coronary artery without angina pectoris: Secondary | ICD-10-CM

## 2014-07-03 DIAGNOSIS — J90 Pleural effusion, not elsewhere classified: Secondary | ICD-10-CM | POA: Diagnosis not present

## 2014-07-03 LAB — POCT INR: INR: 2.1

## 2014-07-03 NOTE — Patient Instructions (Signed)
The patient was given verbal instructions regarding ongoing rehabilitation, lifting instructions, and resumption of driving protocol.

## 2014-07-03 NOTE — Progress Notes (Signed)
[address]       [place] [address]             [place] Record #[id number] Date of Birth: 22-Dec-1940  Referring QI:[id number], Leonie Green, MD Primary Cardiology: Primary Care:FAGAN,ROY, MD  Chief Complaint:  Follow Up Visit   History of Present Illness:       OPERATIVE REPORT   PREOPERATIVE DIAGNOSES: Severe left main disease with non-ST elevation myocardial infarction.  POSTOPERATIVE DIAGNOSIS: Severe left main disease with non-ST elevation myocardial infarction.  PROCEDURE:  Emergency coronary artery bypass grafting x3 Left internal mammary artery to left anterior descending artery, Sequential saphenous vein graft to obtuse marginal 1 and 2 Endoscopic vein harvest right thigh  SURGEON: Remo Lipps C. Roxan Hockey, [name]   Patient is a 74 year old male status post above procedure seen here today for routine follow-up. Overall he is doing well. He is started cardiac rehabilitation and is tolerating although he does have some difficulty with long-term mobilization due to previous hip and knee replacement surgery. He is having no significant difficulties associated with the surgery. He is not requiring pain medication. He denies fevers, chills or other constitutional symptoms. He is not having shortness of breath or chest pain. He has been seen by cardiology in follow-up they are titrating his amiodarone and keeping him on Coumadin for the time being.        Zubrod Score: At the time of surgery this patient's most appropriate activity status/level should be described as: []     0    Normal activity, no symptoms []     1    Restricted in physical strenuous activity but ambulatory, able to do out light work []     2    Ambulatory and capable of self care, unable to do work activities, up and about                 >50 % of waking hours                                                                                    []     3    Only limited self care, in bed greater than 50% of waking hours []     4    Completely disabled, no self care, confined to bed or chair []     5    Moribund  History  Smoking status  . Former Smoker -- 2.00 packs/day for 25 years  . Types: Cigarettes  . Start date: 08/08/1960  . Quit date: 06/02/1982  Smokeless tobacco  . Never Used    Comment: quit smoking 30+yrs ago       Allergies  Allergen Reactions  . Codeine Sulfate Nausea Only    Current Outpatient Prescriptions  Medication Sig Dispense Refill  . amiodarone (PACERONE) 400 MG tablet Take 400 mg daily until 06/15/14, then take 200 mg daily 60 tablet 2  . aspirin 81 MG tablet Take 81 mg by mouth daily.    Marland Kitchen atorvastatin (LIPITOR) 40 MG  tablet Take 1 tablet (40 mg total) by mouth daily. 30 tablet 1  . colchicine 0.6 MG tablet Take 1 tablet (0.6 mg total) by mouth daily. X 5 days 30 tablet 3  . digoxin (LANOXIN) 0.125 MG tablet Take 1 tablet (0.125 mg total) by mouth daily. 30 tablet 1  . diltiazem (CARDIZEM) 60 MG tablet Take 1 tablet (60 mg total) by mouth every 8 (eight) hours. 90 tablet 1  . doxazosin (CARDURA) 2 MG tablet Take 2 mg by mouth daily.    . finasteride (PROSCAR) 5 MG tablet Take 5 mg by mouth at bedtime.     Marland Kitchen lisinopril (PRINIVIL,ZESTRIL) 5 MG tablet Take 1 tablet (5 mg total) by mouth daily. 90 tablet 3  . metoprolol tartrate (LOPRESSOR) 25 MG tablet Take 1 tablet (25 mg total) by mouth 2 (two) times daily. 60 tablet 1  . TESTIM 50 MG/5GM GEL daily as needed. Apply 2 clicks as needed up three times weekly    . warfarin (COUMADIN) 5 MG tablet Take 1 tablet (5 mg total) by mouth daily. Or as directed by Coumadin Clinic 60 tablet 1  . oxyCODONE (ROXICODONE) 5 MG immediate release tablet Take 1-2 tablets (5-10 mg total) by mouth every 3 (three) hours as needed for severe pain. (Patient not taking: Reported on 07/03/2014) 30 tablet 0   No current  facility-administered medications for this visit.       Physical Exam: BP 106/49 mmHg  Pulse 54  Resp 16  Ht 5' 9.5" (1.765 m)  Wt 248 lb (112.492 kg)  BMI 36.11 kg/m2  SpO2 97%  General appearance: alert, cooperative and no distress Heart: regular rate and rhythm Lungs: clear to auscultation bilaterally Abdomen: soft, non-tender; bowel sounds normal; no masses,  no organomegaly Extremities: Positive bilateral lower extremity edema Wound: Incisions noted to be healing well.   Diagnostic Studies & Laboratory data:         Recent Radiology Findings: Dg Chest 2 View  07/03/2014   CLINICAL DATA:  Status post NSTEMI ; CABG May 22, 2014; currently asymptomatic  EXAM: CHEST  2 VIEW  COMPARISON:  PA and lateral chest of May 28, 2014  FINDINGS: The bilateral pleural effusions have markedly decreased in size and only minimal fluid remains the lungs are clear. The cardiac silhouette is top-normal in size. The pulmonary vascularity is not engorged. 6 entire intact sternal wires are demonstrated. There is multilevel degenerative disc disease of the thoracic spine. 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  IMPRESSION: There has been further interval improvement in the appearance of the chest with decreased pleural fluid volumes bilaterally. There is no CHF currently.   Electronically Signed   By: David  Martinique   On: 07/03/2014 13:42      I have independently reviewed the above radiology findings and reviewed findings  with the patient.  Recent Labs: Lab Results  Component Value Date   WBC 8.6 05/28/2014   HGB 10.1* 05/28/2014   HCT 31.4* 05/28/2014   PLT 205 05/28/2014   GLUCOSE 98 05/29/2014   CHOL 116 05/25/2014   TRIG 113 05/25/2014   HDL 38* 05/25/2014   LDLCALC 55 05/25/2014   ALT 17 05/22/2014   AST 51* 05/22/2014   NA 139 05/29/2014   K 4.7 05/29/2014   CL 101 05/29/2014   CREATININE 1.39* 05/30/2014   BUN 15 05/29/2014   CO2 31 05/29/2014     TSH 2.948 05/26/2014   INR 2.1 07/03/2014  HGBA1C 5.8* 05/23/2014      Assessment / Plan:  The patient continues to do well. He is to continue cardiac rehabilitation. He is to continue ongoing cardiology management of his associated risk factors. We will see him again in 1 month as he wants to meet with Dr. Roxan Hockey. There are no current surgical he related issues.          Kolbe Delmonaco E 07/03/2014 2:37 PM

## 2014-07-04 ENCOUNTER — Ambulatory Visit: Payer: Medicare Other | Admitting: Thoracic Surgery (Cardiothoracic Vascular Surgery)

## 2014-07-05 ENCOUNTER — Encounter (HOSPITAL_COMMUNITY)
Admission: RE | Admit: 2014-07-05 | Discharge: 2014-07-05 | Disposition: A | Discharge status: Home / Self Care | Payer: Medicare Other | Source: Ambulatory Visit | Attending: Cardiovascular Disease | Admitting: Cardiovascular Disease

## 2014-07-05 DIAGNOSIS — I214 Non-ST elevation (NSTEMI) myocardial infarction: Secondary | ICD-10-CM | POA: Diagnosis not present

## 2014-07-05 DIAGNOSIS — Z951 Presence of aortocoronary bypass graft: Secondary | ICD-10-CM | POA: Diagnosis not present

## 2014-07-07 ENCOUNTER — Encounter (HOSPITAL_COMMUNITY)
Admission: RE | Admit: 2014-07-07 | Discharge: 2014-07-07 | Disposition: A | Discharge status: Home / Self Care | Payer: Medicare Other | Source: Ambulatory Visit | Attending: Cardiovascular Disease | Admitting: Cardiovascular Disease

## 2014-07-07 DIAGNOSIS — I214 Non-ST elevation (NSTEMI) myocardial infarction: Secondary | ICD-10-CM | POA: Diagnosis not present

## 2014-07-07 DIAGNOSIS — Z951 Presence of aortocoronary bypass graft: Secondary | ICD-10-CM | POA: Diagnosis not present

## 2014-07-10 ENCOUNTER — Encounter (HOSPITAL_COMMUNITY)
Admission: RE | Admit: 2014-07-10 | Discharge: 2014-07-10 | Disposition: A | Discharge status: Home / Self Care | Payer: Medicare Other | Source: Ambulatory Visit | Attending: Cardiovascular Disease | Admitting: Cardiovascular Disease

## 2014-07-10 DIAGNOSIS — Z951 Presence of aortocoronary bypass graft: Secondary | ICD-10-CM | POA: Diagnosis not present

## 2014-07-10 DIAGNOSIS — I214 Non-ST elevation (NSTEMI) myocardial infarction: Secondary | ICD-10-CM | POA: Diagnosis not present

## 2014-07-10 NOTE — Progress Notes (Signed)
Cardiac Rehabilitation Program Outcomes Report   Orientation:  06/13/14 Graduate Date:  tbd Discharge Date:  tbd # of sessions completed: 3  Cardiologist: Bronson Ing Family MD:  Homero Fellers Time:  1100  A.  Exercise Program:  Tolerates exercise @ 2.1 METS for 15 minutes and Walk Test Results:  Pre: 2.59 mets  B.  Mental Health:  Good mental attitude  C.  Education/Instruction/Skills  Accurately checks own pulse.  Rest:  64  Exercise:  89 and Knows THR for exercise  Uses Perceived Exertion Scale and/or Dyspnea Scale  D.  Nutrition/Weight Control/Body Composition:  Adherence to prescribed nutrition program: good    E.  Blood Lipids    Lab Results  Component Value Date   CHOL 116 05/25/2014   HDL 38* 05/25/2014   LDLCALC 55 05/25/2014   TRIG 113 05/25/2014   CHOLHDL 3.1 05/25/2014    F.  Lifestyle Changes:  Making positive lifestyle changes  G.  Symptoms noted with exercise:  Asymptomatic  Report Completed By:  Stevphen Rochester RN    Comments:  Patients first week note.

## 2014-07-12 ENCOUNTER — Encounter (HOSPITAL_COMMUNITY)
Admission: RE | Admit: 2014-07-12 | Discharge: 2014-07-12 | Disposition: A | Discharge status: Home / Self Care | Payer: Medicare Other | Source: Ambulatory Visit | Attending: Cardiovascular Disease | Admitting: Cardiovascular Disease

## 2014-07-12 DIAGNOSIS — I214 Non-ST elevation (NSTEMI) myocardial infarction: Secondary | ICD-10-CM | POA: Diagnosis not present

## 2014-07-12 DIAGNOSIS — Z951 Presence of aortocoronary bypass graft: Secondary | ICD-10-CM | POA: Diagnosis not present

## 2014-07-13 ENCOUNTER — Encounter: Payer: Self-pay | Admitting: Cardiovascular Disease

## 2014-07-13 ENCOUNTER — Ambulatory Visit (INDEPENDENT_AMBULATORY_CARE_PROVIDER_SITE_OTHER): Payer: Medicare Other | Admitting: Cardiovascular Disease

## 2014-07-13 VITALS — BP 132/60 | HR 52 | Ht 70.0 in | Wt 252.0 lb

## 2014-07-13 DIAGNOSIS — I868 Varicose veins of other specified sites: Secondary | ICD-10-CM

## 2014-07-13 DIAGNOSIS — Z5181 Encounter for therapeutic drug level monitoring: Secondary | ICD-10-CM

## 2014-07-13 DIAGNOSIS — I5022 Chronic systolic (congestive) heart failure: Secondary | ICD-10-CM

## 2014-07-13 DIAGNOSIS — R600 Localized edema: Secondary | ICD-10-CM | POA: Diagnosis not present

## 2014-07-13 DIAGNOSIS — I25812 Atherosclerosis of bypass graft of coronary artery of transplanted heart without angina pectoris: Secondary | ICD-10-CM

## 2014-07-13 DIAGNOSIS — I10 Essential (primary) hypertension: Secondary | ICD-10-CM | POA: Diagnosis not present

## 2014-07-13 DIAGNOSIS — I4891 Unspecified atrial fibrillation: Secondary | ICD-10-CM | POA: Diagnosis not present

## 2014-07-13 DIAGNOSIS — I8390 Asymptomatic varicose veins of unspecified lower extremity: Secondary | ICD-10-CM

## 2014-07-13 DIAGNOSIS — Z9889 Other specified postprocedural states: Secondary | ICD-10-CM

## 2014-07-13 DIAGNOSIS — I255 Ischemic cardiomyopathy: Secondary | ICD-10-CM | POA: Diagnosis not present

## 2014-07-13 DIAGNOSIS — E785 Hyperlipidemia, unspecified: Secondary | ICD-10-CM | POA: Diagnosis not present

## 2014-07-13 DIAGNOSIS — Z951 Presence of aortocoronary bypass graft: Secondary | ICD-10-CM | POA: Diagnosis not present

## 2014-07-13 MED ORDER — POTASSIUM CHLORIDE CRYS ER 20 MEQ PO TBCR: 20.0000 meq | EXTENDED_RELEASE_TABLET | Freq: Every day | ORAL | Status: DC

## 2014-07-13 MED ORDER — FUROSEMIDE 40 MG PO TABS: 40.0000 mg | ORAL_TABLET | Freq: Every day | ORAL | Status: DC

## 2014-07-13 MED ORDER — DILTIAZEM HCL 60 MG PO TABS: 60.0000 mg | ORAL_TABLET | Freq: Two times a day (BID) | ORAL | Status: DC

## 2014-07-13 NOTE — Patient Instructions (Signed)
Your physician recommends that you schedule a follow-up appointment in: 10 weeks at the [place] office with Dr. Bronson Ing. Your physician has recommended you make the following change in your medication: Stop digoxin. Decrease diltiazem 60 mg to twice daily. Start furosemide 40 mg daily. Start potassium 20 meq daily. Continue all other medications the same. Your physician recommends that you have lab work on Monday, July 17, 2014 to check your BMET.

## 2014-07-13 NOTE — Progress Notes (Addendum)
Patient ID: Caleb Wolf, male   DOB: 1940-08-15, 74 y.o.   MRN: [id number]      SUBJECTIVE: The patient returns for routine follow-up after undergoing CABG after sustaining a myocardial infarction. He also had perioperative atrial fibrillation. He presently denies palpitations, chest pain, and shortness of breath. He has successfully been participating in cardiac rehabilitation. His wife has done an excellent job in helping him modify his diet. He has been bothered by leg swelling. He and his wife have several questions with regards to the recovery process as well as details regarding his medications and his diet and exercise regimen.   Review of Systems: As per "subjective", otherwise negative.  Allergies  Allergen Reactions  . Codeine Sulfate Nausea Only    Current Outpatient Prescriptions  Medication Sig Dispense Refill  . amiodarone (PACERONE) 200 MG tablet Take 200 mg by mouth daily.    Marland Kitchen aspirin 81 MG tablet Take 81 mg by mouth daily.    Marland Kitchen atorvastatin (LIPITOR) 40 MG tablet Take 1 tablet (40 mg total) by mouth daily. 30 tablet 1  . colchicine 0.6 MG tablet Take 0.6 mg by mouth as needed.    . digoxin (LANOXIN) 0.125 MG tablet Take 1 tablet (0.125 mg total) by mouth daily. 30 tablet 1  . diltiazem (CARDIZEM) 60 MG tablet Take 1 tablet (60 mg total) by mouth every 8 (eight) hours. 90 tablet 1  . doxazosin (CARDURA) 2 MG tablet Take 2 mg by mouth daily.    . finasteride (PROSCAR) 5 MG tablet Take 5 mg by mouth at bedtime.     Marland Kitchen lisinopril (PRINIVIL,ZESTRIL) 5 MG tablet Take 1 tablet (5 mg total) by mouth daily. 90 tablet 3  . metoprolol tartrate (LOPRESSOR) 25 MG tablet Take 1 tablet (25 mg total) by mouth 2 (two) times daily. 60 tablet 1  . oxyCODONE (ROXICODONE) 5 MG immediate release tablet Take 1-2 tablets (5-10 mg total) by mouth every 3 (three) hours as needed for severe pain. 30 tablet 0  . TESTIM 50 MG/5GM GEL daily as needed. Apply 2 clicks as needed up three times weekly      . warfarin (COUMADIN) 5 MG tablet Take 1 tablet (5 mg total) by mouth daily. Or as directed by [facility] (Patient taking differently: Take 5 mg by mouth daily. Or as directed by [facility] 7.5 M, W, F, Sat 5mg  T, TH, Sun) 60 tablet 1   No current facility-administered medications for this visit.    Past Medical History  Diagnosis Date  . Coronary artery disease   . GERD (gastroesophageal reflux disease)   . Arthritis   . Hyperlipidemia     takes Simvastatin daily  . Carotid stenosis     07/04/11- 50-70%bilaterally- stable per Dr Willey Blade note  . Lower leg edema   . Lumbar disc disease   . Gout     takes Indocin daily as needed  . Enlarged prostate     takes Finasteride daily  . History of kidney stones   . PONV (postoperative nausea and vomiting)     also hard to urinate after surgery  . Sleep apnea     uses CPAP  . Hypertension     takes Clonidine,Verapamil,HCTZ,and Lisinopril daily  . Joint pain   . Chronic back pain   . History of colon polyps   . Urinary urgency     Past Surgical History  Procedure Laterality Date  . Fracture surgery      bilateral wrist  fractures- one ORIF  . Rhinoplasty    . Joint replacement  2011    left knee  . Eye surgery      cataract extraction with repair macular tear , with IOL     right  . Total hip arthroplasty  12/08/2011    Procedure: TOTAL HIP ARTHROPLASTY;  Surgeon: Gearlean Alf, MD;  Location: [facility];  Service: Orthopedics;  Laterality: Right;  . Tonsillectomy    . Wrist surgery Right 11yrs ago  . Lithotripsy    . Back surgery    . Wrist surgery Left   . Cataract surgery Right   . Colonoscopy    . 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 Endarterectomy Left 02/17/2014    Procedure: ENDARTERECTOMY CAROTID WITH PATCH ANGIOPLASTY;  Surgeon: Mal Misty, MD;  Location: [facility];  Service: Vascular;  Laterality: Left;  . Left heart catheterization with coronary angiogram N/A 05/22/2014    Procedure: LEFT HEART  CATHETERIZATION WITH CORONARY ANGIOGRAM;  Surgeon: Leonie Man, MD;  Location: [facility] CATH LAB;  Service: Cardiovascular;  Laterality: N/A;  . Cardiac catheterization  05/22/2014    Procedure: IABP INSERTION;  Surgeon: Leonie Man, MD;  Location: [facility] CATH LAB;  Service: Cardiovascular;;  . Coronary artery bypass graft N/A 05/22/2014    Procedure: CORONARY ARTERY BYPASS GRAFTING (CABG) times three using left internal mammary and right saphenous vein.;  Surgeon: Melrose Nakayama, MD;  Location: [facility];  Service: Open Heart Surgery;  Laterality: N/A;    History   Social History  . Marital Status: Married    Spouse Name: N/A  . Number of Children: N/A  . Years of Education: N/A   Occupational History  . retired     [employer]   Social History Main Topics  . Smoking status: Former Smoker -- 2.00 packs/day for 25 years    Types: Cigarettes    Start date: 08/08/1960    Quit date: 06/02/1982  . Smokeless tobacco: Never Used     Comment: quit smoking 30+yrs ago  . Alcohol Use: 0.0 oz/week    0 Standard drinks or equivalent per week     Comment: occasionally   . Drug Use: No  . Sexual Activity: Yes   Other Topics Concern  . Not on file   Social History Narrative     Filed Vitals:   07/13/14 1330  BP: 132/60  Pulse: 52  Height: 5\' 10"  (1.778 m)  Weight: 252 lb (114.306 kg)  SpO2: 97%    PHYSICAL EXAM General: NAD HEENT: Normal. Neck: No JVD, no thyromegaly. Lungs: Clear to auscultation bilaterally with normal respiratory effort. CV: Nondisplaced PMI.  Regular rate and rhythm, normal S1/S2, no S3/S4, no murmur. 1-2+ pretibial and periankle edema left > right.   Abdomen: Soft, obese, no distention.  Neurologic: Alert and oriented x 3.  Psych: Normal affect. Skin: Normal. Musculoskeletal: No gross deformities. Extremities: No clubbing or cyanosis.   ECG: Most recent ECG reviewed.      ASSESSMENT AND PLAN: 1. CAD with recent CABG:  Symptomatically stable. Will continue cardiac rehabilitation. Continue therapy with aspirin 81 mg, metoprolol 25 mg twice daily, and Lipitor 40 mg. 2. Essential HTN: Well controlled on lisinopril 5 mg daily. No changes. 3. Hyperlipidemia: Lipids on 05/25/2014 demonstrated total cholesterol 116, triglycerides 113, HDL 38, LDL 55. Continue Lipitor 40 mg daily.  4. Carotid artery stenosis s/p left CEA: Stable. Continue ASA and statin.  5.  Sleep apnea: Uses CPAP.  6. Leg swelling/venous varicosities: Continue use of compression stockings. Will prescribe Lasix (see #8). 7. Atrial fibrillation: Remains in a regular rhythm. He is currently taking amiodarone 200 mg daily. I will continue this until his next office visit and then discontinue if he remains in a regular rhythm. For the time being, I will continue warfarin. I will discontinue digoxin and reduce diltiazem to 60 mg bid from tid. 8. Leg edema/chronic systolic heart failure: EF 40-45% by TEE. Will start Lasix 40 mg daily and KCl 20 meq daily with BMET on 2/15. Encouraged compression stocking use.   Dispo: f/u 10 weeks.  Time spent: 40 minutes, of which >50% spent reviewing all medications and discussed cardiac rehab and its benefits as well as exercise and dietary modification.   Kate Sable, [name], F.A.C.C.

## 2014-07-14 ENCOUNTER — Encounter (HOSPITAL_COMMUNITY)
Admission: RE | Admit: 2014-07-14 | Discharge: 2014-07-14 | Disposition: A | Discharge status: Home / Self Care | Payer: Medicare Other | Source: Ambulatory Visit | Attending: Cardiovascular Disease | Admitting: Cardiovascular Disease

## 2014-07-14 DIAGNOSIS — I214 Non-ST elevation (NSTEMI) myocardial infarction: Secondary | ICD-10-CM | POA: Diagnosis not present

## 2014-07-14 DIAGNOSIS — Z951 Presence of aortocoronary bypass graft: Secondary | ICD-10-CM | POA: Diagnosis not present

## 2014-07-17 ENCOUNTER — Encounter (HOSPITAL_COMMUNITY)
Admission: RE | Admit: 2014-07-17 | Discharge: 2014-07-17 | Disposition: A | Discharge status: Home / Self Care | Payer: Medicare Other | Source: Ambulatory Visit | Attending: Cardiovascular Disease | Admitting: Cardiovascular Disease

## 2014-07-17 ENCOUNTER — Ambulatory Visit (INDEPENDENT_AMBULATORY_CARE_PROVIDER_SITE_OTHER): Payer: Medicare Other | Admitting: *Deleted

## 2014-07-17 DIAGNOSIS — Z5181 Encounter for therapeutic drug level monitoring: Secondary | ICD-10-CM

## 2014-07-17 DIAGNOSIS — I214 Non-ST elevation (NSTEMI) myocardial infarction: Secondary | ICD-10-CM | POA: Diagnosis not present

## 2014-07-17 DIAGNOSIS — R609 Edema, unspecified: Secondary | ICD-10-CM | POA: Diagnosis not present

## 2014-07-17 DIAGNOSIS — I4891 Unspecified atrial fibrillation: Secondary | ICD-10-CM | POA: Diagnosis not present

## 2014-07-17 DIAGNOSIS — Z951 Presence of aortocoronary bypass graft: Secondary | ICD-10-CM | POA: Diagnosis not present

## 2014-07-17 LAB — POCT INR: INR: 2.6

## 2014-07-18 ENCOUNTER — Telehealth: Payer: Self-pay | Admitting: *Deleted

## 2014-07-18 LAB — BASIC METABOLIC PANEL
BUN: 14 mg/dL (ref 6–23)
CO2: 28 mEq/L (ref 19–32)
Calcium: 8.9 mg/dL (ref 8.4–10.5)
Chloride: 102 mEq/L (ref 96–112)
Creat: 1.27 mg/dL (ref 0.50–1.35)
GLUCOSE: 96 mg/dL (ref 70–99)
POTASSIUM: 4.8 meq/L (ref 3.5–5.3)
SODIUM: 139 meq/L (ref 135–145)

## 2014-07-18 NOTE — Telephone Encounter (Signed)
Notes Recorded by Laurine Blazer, LPN on 5/37/9432 at 7:61 PM Patient notified.

## 2014-07-18 NOTE — Telephone Encounter (Signed)
-----   Message from Merlene Laughter, LPN sent at 6/81/1572  4:22 PM EST -----   ----- Message -----    From: Herminio Commons, MD    Sent: 07/18/2014   8:46 AM      To: Merlene Laughter, LPN  Improved [facility].

## 2014-07-19 ENCOUNTER — Encounter (HOSPITAL_COMMUNITY)
Admission: RE | Admit: 2014-07-19 | Discharge: 2014-07-19 | Disposition: A | Discharge status: Home / Self Care | Payer: Medicare Other | Source: Ambulatory Visit | Attending: Cardiovascular Disease | Admitting: Cardiovascular Disease

## 2014-07-19 DIAGNOSIS — Z951 Presence of aortocoronary bypass graft: Secondary | ICD-10-CM | POA: Diagnosis not present

## 2014-07-19 DIAGNOSIS — I214 Non-ST elevation (NSTEMI) myocardial infarction: Secondary | ICD-10-CM | POA: Diagnosis not present

## 2014-07-21 ENCOUNTER — Encounter (HOSPITAL_COMMUNITY)
Admission: RE | Admit: 2014-07-21 | Discharge: 2014-07-21 | Disposition: A | Discharge status: Home / Self Care | Payer: Medicare Other | Source: Ambulatory Visit | Attending: Cardiovascular Disease | Admitting: Cardiovascular Disease

## 2014-07-21 DIAGNOSIS — I10 Essential (primary) hypertension: Secondary | ICD-10-CM | POA: Diagnosis not present

## 2014-07-21 DIAGNOSIS — Z951 Presence of aortocoronary bypass graft: Secondary | ICD-10-CM | POA: Diagnosis not present

## 2014-07-21 DIAGNOSIS — I214 Non-ST elevation (NSTEMI) myocardial infarction: Secondary | ICD-10-CM | POA: Diagnosis not present

## 2014-07-21 DIAGNOSIS — I4891 Unspecified atrial fibrillation: Secondary | ICD-10-CM | POA: Diagnosis not present

## 2014-07-21 DIAGNOSIS — I2510 Atherosclerotic heart disease of native coronary artery without angina pectoris: Secondary | ICD-10-CM | POA: Diagnosis not present

## 2014-07-24 ENCOUNTER — Encounter (HOSPITAL_COMMUNITY)
Admission: RE | Admit: 2014-07-24 | Discharge: 2014-07-24 | Disposition: A | Discharge status: Home / Self Care | Payer: Medicare Other | Source: Ambulatory Visit | Attending: Cardiovascular Disease | Admitting: Cardiovascular Disease

## 2014-07-24 DIAGNOSIS — I214 Non-ST elevation (NSTEMI) myocardial infarction: Secondary | ICD-10-CM | POA: Diagnosis not present

## 2014-07-24 DIAGNOSIS — Z951 Presence of aortocoronary bypass graft: Secondary | ICD-10-CM | POA: Diagnosis not present

## 2014-07-25 ENCOUNTER — Ambulatory Visit: Payer: Medicare Other | Admitting: Cardiovascular Disease

## 2014-07-26 ENCOUNTER — Encounter (HOSPITAL_COMMUNITY)
Admission: RE | Admit: 2014-07-26 | Discharge: 2014-07-26 | Disposition: A | Discharge status: Home / Self Care | Payer: Medicare Other | Source: Ambulatory Visit | Attending: Cardiovascular Disease | Admitting: Cardiovascular Disease

## 2014-07-26 DIAGNOSIS — I214 Non-ST elevation (NSTEMI) myocardial infarction: Secondary | ICD-10-CM | POA: Diagnosis not present

## 2014-07-26 DIAGNOSIS — Z951 Presence of aortocoronary bypass graft: Secondary | ICD-10-CM | POA: Diagnosis not present

## 2014-07-28 ENCOUNTER — Encounter (HOSPITAL_COMMUNITY)
Admission: RE | Admit: 2014-07-28 | Discharge: 2014-07-28 | Disposition: A | Discharge status: Home / Self Care | Payer: Medicare Other | Source: Ambulatory Visit | Attending: Cardiovascular Disease | Admitting: Cardiovascular Disease

## 2014-07-28 DIAGNOSIS — Z951 Presence of aortocoronary bypass graft: Secondary | ICD-10-CM | POA: Diagnosis not present

## 2014-07-28 DIAGNOSIS — I214 Non-ST elevation (NSTEMI) myocardial infarction: Secondary | ICD-10-CM | POA: Diagnosis not present

## 2014-07-31 ENCOUNTER — Encounter (HOSPITAL_COMMUNITY)
Admission: RE | Admit: 2014-07-31 | Discharge: 2014-07-31 | Disposition: A | Discharge status: Home / Self Care | Payer: Medicare Other | Source: Ambulatory Visit | Attending: Cardiovascular Disease | Admitting: Cardiovascular Disease

## 2014-07-31 DIAGNOSIS — I214 Non-ST elevation (NSTEMI) myocardial infarction: Secondary | ICD-10-CM | POA: Diagnosis not present

## 2014-07-31 DIAGNOSIS — Z951 Presence of aortocoronary bypass graft: Secondary | ICD-10-CM | POA: Diagnosis not present

## 2014-08-01 ENCOUNTER — Encounter: Payer: Self-pay | Admitting: Thoracic Surgery (Cardiothoracic Vascular Surgery)

## 2014-08-01 ENCOUNTER — Ambulatory Visit (INDEPENDENT_AMBULATORY_CARE_PROVIDER_SITE_OTHER): Payer: Self-pay | Admitting: Thoracic Surgery (Cardiothoracic Vascular Surgery)

## 2014-08-01 VITALS — BP 104/56 | HR 54 | Resp 20 | Ht 70.0 in | Wt 250.0 lb

## 2014-08-01 DIAGNOSIS — Z951 Presence of aortocoronary bypass graft: Secondary | ICD-10-CM

## 2014-08-01 DIAGNOSIS — I2510 Atherosclerotic heart disease of native coronary artery without angina pectoris: Secondary | ICD-10-CM

## 2014-08-01 NOTE — Progress Notes (Signed)
HPI:  [name] for a postoperative follow-up visit.  He is a 74 year old gentleman who had emergency coronary bypass grafting 3 on 05/22/2014 her presenting with an MI. He had atrial fibrillation postoperatively and was discharged on amiodarone and Coumadin. His amiodarone currently is down to 200 mg once daily.  He saw Dr. Bronson Ing on 07/13/2014.  He states that he is feeling well. He has not had any recurrent angina. He has gradually been increasing his activities. He does have some pain just to the left of the sternum. This is particularly aggravated when he lies on his left side. He is not feeling clicking or popping. He does complain of increased swelling in his left leg which is exacerbation of a chronic problem. He is on Lasix for that   Past Medical History  Diagnosis Date  . Coronary artery disease   . GERD (gastroesophageal reflux disease)   . Arthritis   . Hyperlipidemia     takes Simvastatin daily  . Carotid stenosis     07/04/11- 50-70%bilaterally- stable per Dr Willey Blade note  . Lower leg edema   . Lumbar disc disease   . Gout     takes Indocin daily as needed  . Enlarged prostate     takes Finasteride daily  . History of kidney stones   . PONV (postoperative nausea and vomiting)     also hard to urinate after surgery  . Sleep apnea     uses CPAP  . Hypertension     takes Clonidine,Verapamil,HCTZ,and Lisinopril daily  . Joint pain   . Chronic back pain   . History of colon polyps   . Urinary urgency      Current Outpatient Prescriptions  Medication Sig Dispense Refill  . amiodarone (PACERONE) 200 MG tablet Take 200 mg by mouth daily.    Marland Kitchen aspirin 81 MG tablet Take 81 mg by mouth daily.    Marland Kitchen atorvastatin (LIPITOR) 40 MG tablet Take 1 tablet (40 mg total) by mouth daily. 30 tablet 1  . colchicine 0.6 MG tablet Take 0.6 mg by mouth as needed.    . diltiazem (CARDIZEM) 60 MG tablet Take 1 tablet (60 mg total) by mouth 2 (two) times daily. 60 tablet 3  .  doxazosin (CARDURA) 2 MG tablet Take 2 mg by mouth daily.    . finasteride (PROSCAR) 5 MG tablet Take 5 mg by mouth at bedtime.     . furosemide (LASIX) 40 MG tablet Take 1 tablet (40 mg total) by mouth daily. 30 tablet 3  . lisinopril (PRINIVIL,ZESTRIL) 5 MG tablet Take 1 tablet (5 mg total) by mouth daily. 90 tablet 3  . metoprolol tartrate (LOPRESSOR) 25 MG tablet Take 1 tablet (25 mg total) by mouth 2 (two) times daily. 60 tablet 1  . potassium chloride SA (KLOR-CON M20) 20 MEQ tablet Take 1 tablet (20 mEq total) by mouth daily. 30 tablet 3  . TESTIM 50 MG/5GM GEL daily as needed. Apply 2 clicks as needed up three times weekly    . warfarin (COUMADIN) 5 MG tablet Take 1 tablet (5 mg total) by mouth daily. Or as directed by [facility] (Patient taking differently: Take 5 mg by mouth daily. Or as directed by [facility] 7.5 M, W, F, Sat 5mg  T, TH, Sun) 60 tablet 1   No current facility-administered medications for this visit.    Physical Exam BP 104/56 mmHg  Pulse 54  Resp 20  Ht 5\' 10"  (1.778 m)  Wt 250 lb (113.399 kg)  BMI 35.87 kg/m2  SpO40 31% 74 year old man in no acute distress Alert and oriented 3 with no focal deficits Sternum stable, incision well-healed Cardiac bradycardic and regular, no rub or murmur Lungs clear with equal breath sounds bilaterally 1+ edema right leg, 3+ left leg, compression stocking in place  Diagnostic Tests: Chest x-ray 07/03/2014 showed postoperative changes with tiny effusions bilaterally  Impression: 74 year old man who is now about 2 months out from coronary bypass grafting. He is doing well at this time. He has minimal discomfort. His exercise tolerance is good. He is doing cardiac rehabilitation.  He had a lot of questions about activity level. At this point his activities are unrestricted, but he was cautioned to build into new activities gradually.  He also had questions about lifestyle. I emphasized the importance of secondary  prevention.  He had atrial fib postoperatively. He has been on amiodarone and Coumadin. Dr. Bronson Ing will address that at his next visit in about a month.  Plan: He will follow-up with Dr. Bronson Ing  I will be happy to see him back any time if I can be of any further assistance with his care  Remo Lipps C. Roxan Hockey, MD [facility] [phone]

## 2014-08-02 ENCOUNTER — Encounter (HOSPITAL_COMMUNITY)
Admission: RE | Admit: 2014-08-02 | Discharge: 2014-08-02 | Disposition: A | Discharge status: Home / Self Care | Payer: Medicare Other | Source: Ambulatory Visit | Attending: Cardiovascular Disease | Admitting: Cardiovascular Disease

## 2014-08-02 ENCOUNTER — Ambulatory Visit (INDEPENDENT_AMBULATORY_CARE_PROVIDER_SITE_OTHER): Payer: Medicare Other | Admitting: *Deleted

## 2014-08-02 DIAGNOSIS — I4891 Unspecified atrial fibrillation: Secondary | ICD-10-CM

## 2014-08-02 DIAGNOSIS — I214 Non-ST elevation (NSTEMI) myocardial infarction: Secondary | ICD-10-CM | POA: Diagnosis not present

## 2014-08-02 DIAGNOSIS — Z5181 Encounter for therapeutic drug level monitoring: Secondary | ICD-10-CM | POA: Diagnosis not present

## 2014-08-02 DIAGNOSIS — Z951 Presence of aortocoronary bypass graft: Secondary | ICD-10-CM | POA: Diagnosis not present

## 2014-08-02 LAB — POCT INR: INR: 1.9

## 2014-08-04 ENCOUNTER — Encounter (HOSPITAL_COMMUNITY)
Admission: RE | Admit: 2014-08-04 | Discharge: 2014-08-04 | Disposition: A | Discharge status: Home / Self Care | Payer: Medicare Other | Source: Ambulatory Visit | Attending: Cardiovascular Disease | Admitting: Cardiovascular Disease

## 2014-08-04 DIAGNOSIS — Z951 Presence of aortocoronary bypass graft: Secondary | ICD-10-CM | POA: Diagnosis not present

## 2014-08-04 DIAGNOSIS — I214 Non-ST elevation (NSTEMI) myocardial infarction: Secondary | ICD-10-CM | POA: Diagnosis not present

## 2014-08-07 ENCOUNTER — Encounter (HOSPITAL_COMMUNITY)
Admission: RE | Admit: 2014-08-07 | Discharge: 2014-08-07 | Disposition: A | Discharge status: Home / Self Care | Payer: Medicare Other | Source: Ambulatory Visit | Attending: Cardiovascular Disease | Admitting: Cardiovascular Disease

## 2014-08-07 DIAGNOSIS — I214 Non-ST elevation (NSTEMI) myocardial infarction: Secondary | ICD-10-CM | POA: Diagnosis not present

## 2014-08-07 DIAGNOSIS — Z951 Presence of aortocoronary bypass graft: Secondary | ICD-10-CM | POA: Diagnosis not present

## 2014-08-07 NOTE — Progress Notes (Signed)
Cardiac Rehabilitation Program Outcomes Report   Orientation:  06/13/14 Graduate Date:  tbd Discharge Date:  tbd # of sessions completed: 18  Cardiologist: Gaynelle Cage MD:  Homero Fellers Time:  1100  A.  Exercise Program:  Tolerates exercise @ 2.00 METS for 15 minutes  B.  Mental Health:  Good mental attitude  C.  Education/Instruction/Skills  Accurately checks own pulse.  Rest:  53  Exercise:  83 and Knows THR for exercise  Uses Perceived Exertion Scale and/or Dyspnea Scale  D.  Nutrition/Weight Control/Body Composition:  Adherence to prescribed nutrition program: good    E.  Blood Lipids    Lab Results  Component Value Date   CHOL 116 05/25/2014   HDL 38* 05/25/2014   LDLCALC 55 05/25/2014   TRIG 113 05/25/2014   CHOLHDL 3.1 05/25/2014    F.  Lifestyle Changes:  Making positive lifestyle changes  G.  Symptoms noted with exercise:  Asymptomatic  Report Completed By:  Stevphen Rochester RN   Comments:  This is patients halfway noted.  Patient is enjoying CR and states he is feeling better.

## 2014-08-09 ENCOUNTER — Encounter (HOSPITAL_COMMUNITY)
Admission: RE | Admit: 2014-08-09 | Discharge: 2014-08-09 | Disposition: A | Discharge status: Home / Self Care | Payer: Medicare Other | Source: Ambulatory Visit | Attending: Cardiovascular Disease | Admitting: Cardiovascular Disease

## 2014-08-09 DIAGNOSIS — Z951 Presence of aortocoronary bypass graft: Secondary | ICD-10-CM | POA: Diagnosis not present

## 2014-08-09 DIAGNOSIS — I214 Non-ST elevation (NSTEMI) myocardial infarction: Secondary | ICD-10-CM | POA: Diagnosis not present

## 2014-08-11 ENCOUNTER — Encounter (HOSPITAL_COMMUNITY)
Admission: RE | Admit: 2014-08-11 | Discharge: 2014-08-11 | Disposition: A | Discharge status: Home / Self Care | Payer: Medicare Other | Source: Ambulatory Visit | Attending: Cardiovascular Disease | Admitting: Cardiovascular Disease

## 2014-08-11 DIAGNOSIS — Z951 Presence of aortocoronary bypass graft: Secondary | ICD-10-CM | POA: Diagnosis not present

## 2014-08-11 DIAGNOSIS — I214 Non-ST elevation (NSTEMI) myocardial infarction: Secondary | ICD-10-CM | POA: Diagnosis not present

## 2014-08-14 ENCOUNTER — Other Ambulatory Visit: Payer: Self-pay | Admitting: Cardiovascular Disease

## 2014-08-14 ENCOUNTER — Encounter (HOSPITAL_COMMUNITY)
Admission: RE | Admit: 2014-08-14 | Discharge: 2014-08-14 | Disposition: A | Discharge status: Home / Self Care | Payer: Medicare Other | Source: Ambulatory Visit | Attending: Cardiovascular Disease | Admitting: Cardiovascular Disease

## 2014-08-14 DIAGNOSIS — I214 Non-ST elevation (NSTEMI) myocardial infarction: Secondary | ICD-10-CM | POA: Diagnosis not present

## 2014-08-14 DIAGNOSIS — Z951 Presence of aortocoronary bypass graft: Secondary | ICD-10-CM | POA: Diagnosis not present

## 2014-08-14 MED ORDER — METOPROLOL TARTRATE 25 MG PO TABS: 25.0000 mg | ORAL_TABLET | Freq: Two times a day (BID) | ORAL | Status: DC

## 2014-08-14 NOTE — Telephone Encounter (Signed)
Received fax refill request  Rx # [id number] Medication:  Metoprolol 25 mg tablets Qty 60 Sig:  Take one tablet by mouth twice daily Physician:  Bronson Ing

## 2014-08-16 ENCOUNTER — Encounter (HOSPITAL_COMMUNITY)
Admission: RE | Admit: 2014-08-16 | Discharge: 2014-08-16 | Disposition: A | Discharge status: Home / Self Care | Payer: Medicare Other | Source: Ambulatory Visit | Attending: Cardiovascular Disease | Admitting: Cardiovascular Disease

## 2014-08-16 ENCOUNTER — Ambulatory Visit (INDEPENDENT_AMBULATORY_CARE_PROVIDER_SITE_OTHER): Payer: Medicare Other | Admitting: *Deleted

## 2014-08-16 DIAGNOSIS — I4891 Unspecified atrial fibrillation: Secondary | ICD-10-CM

## 2014-08-16 DIAGNOSIS — Z951 Presence of aortocoronary bypass graft: Secondary | ICD-10-CM | POA: Diagnosis not present

## 2014-08-16 DIAGNOSIS — I214 Non-ST elevation (NSTEMI) myocardial infarction: Secondary | ICD-10-CM | POA: Diagnosis not present

## 2014-08-16 DIAGNOSIS — Z5181 Encounter for therapeutic drug level monitoring: Secondary | ICD-10-CM | POA: Diagnosis not present

## 2014-08-16 LAB — POCT INR: INR: 2.2

## 2014-08-18 ENCOUNTER — Encounter (HOSPITAL_COMMUNITY)
Admission: RE | Admit: 2014-08-18 | Discharge: 2014-08-18 | Disposition: A | Discharge status: Home / Self Care | Payer: Medicare Other | Source: Ambulatory Visit | Attending: Cardiovascular Disease | Admitting: Cardiovascular Disease

## 2014-08-18 DIAGNOSIS — I214 Non-ST elevation (NSTEMI) myocardial infarction: Secondary | ICD-10-CM | POA: Diagnosis not present

## 2014-08-18 DIAGNOSIS — Z951 Presence of aortocoronary bypass graft: Secondary | ICD-10-CM | POA: Diagnosis not present

## 2014-08-21 ENCOUNTER — Encounter (HOSPITAL_COMMUNITY)
Admission: RE | Admit: 2014-08-21 | Discharge: 2014-08-21 | Disposition: A | Discharge status: Home / Self Care | Payer: Medicare Other | Source: Ambulatory Visit | Attending: Cardiovascular Disease | Admitting: Cardiovascular Disease

## 2014-08-21 DIAGNOSIS — Z951 Presence of aortocoronary bypass graft: Secondary | ICD-10-CM | POA: Diagnosis not present

## 2014-08-21 DIAGNOSIS — I214 Non-ST elevation (NSTEMI) myocardial infarction: Secondary | ICD-10-CM | POA: Diagnosis not present

## 2014-08-23 ENCOUNTER — Encounter (HOSPITAL_COMMUNITY)
Admission: RE | Admit: 2014-08-23 | Discharge: 2014-08-23 | Disposition: A | Discharge status: Home / Self Care | Payer: Medicare Other | Source: Ambulatory Visit | Attending: Cardiovascular Disease | Admitting: Cardiovascular Disease

## 2014-08-23 DIAGNOSIS — I214 Non-ST elevation (NSTEMI) myocardial infarction: Secondary | ICD-10-CM | POA: Diagnosis not present

## 2014-08-23 DIAGNOSIS — Z951 Presence of aortocoronary bypass graft: Secondary | ICD-10-CM | POA: Diagnosis not present

## 2014-08-24 ENCOUNTER — Telehealth: Payer: Self-pay | Admitting: Cardiovascular Disease

## 2014-08-24 MED ORDER — WARFARIN SODIUM 5 MG PO TABS: ORAL_TABLET | ORAL | Status: DC

## 2014-08-24 NOTE — Telephone Encounter (Signed)
Received fax refill request  Rx # [id number] Medication:  Warfarin Sodium 5 mg tab Qty 60 Sig:  Take one tablet by mouth daily or as directed by Coumadin  Physician:  Bronson Ing

## 2014-08-25 ENCOUNTER — Encounter (HOSPITAL_COMMUNITY)
Admission: RE | Admit: 2014-08-25 | Discharge: 2014-08-25 | Disposition: A | Discharge status: Home / Self Care | Payer: Medicare Other | Source: Ambulatory Visit | Attending: Cardiovascular Disease | Admitting: Cardiovascular Disease

## 2014-08-25 DIAGNOSIS — Z951 Presence of aortocoronary bypass graft: Secondary | ICD-10-CM | POA: Diagnosis not present

## 2014-08-25 DIAGNOSIS — I214 Non-ST elevation (NSTEMI) myocardial infarction: Secondary | ICD-10-CM | POA: Diagnosis not present

## 2014-08-28 ENCOUNTER — Encounter (HOSPITAL_COMMUNITY)
Admission: RE | Admit: 2014-08-28 | Discharge: 2014-08-28 | Disposition: A | Discharge status: Home / Self Care | Payer: Medicare Other | Source: Ambulatory Visit | Attending: Cardiovascular Disease | Admitting: Cardiovascular Disease

## 2014-08-28 DIAGNOSIS — Z951 Presence of aortocoronary bypass graft: Secondary | ICD-10-CM | POA: Diagnosis not present

## 2014-08-28 DIAGNOSIS — I214 Non-ST elevation (NSTEMI) myocardial infarction: Secondary | ICD-10-CM | POA: Diagnosis not present

## 2014-08-29 ENCOUNTER — Telehealth: Payer: Self-pay | Admitting: Pulmonary Disease

## 2014-08-29 DIAGNOSIS — G4733 Obstructive sleep apnea (adult) (pediatric): Secondary | ICD-10-CM

## 2014-08-29 NOTE — Telephone Encounter (Signed)
Order has been placed. Pt is aware. Nothing further was needed. 

## 2014-08-30 ENCOUNTER — Encounter (HOSPITAL_COMMUNITY)
Admission: RE | Admit: 2014-08-30 | Discharge: 2014-08-30 | Disposition: A | Discharge status: Home / Self Care | Payer: Medicare Other | Source: Ambulatory Visit | Attending: Cardiovascular Disease | Admitting: Cardiovascular Disease

## 2014-08-30 ENCOUNTER — Ambulatory Visit (INDEPENDENT_AMBULATORY_CARE_PROVIDER_SITE_OTHER): Payer: Medicare Other | Admitting: *Deleted

## 2014-08-30 DIAGNOSIS — I4891 Unspecified atrial fibrillation: Secondary | ICD-10-CM | POA: Diagnosis not present

## 2014-08-30 DIAGNOSIS — Z5181 Encounter for therapeutic drug level monitoring: Secondary | ICD-10-CM

## 2014-08-30 DIAGNOSIS — Z951 Presence of aortocoronary bypass graft: Secondary | ICD-10-CM | POA: Diagnosis not present

## 2014-08-30 DIAGNOSIS — I214 Non-ST elevation (NSTEMI) myocardial infarction: Secondary | ICD-10-CM | POA: Diagnosis not present

## 2014-08-30 LAB — POCT INR: INR: 3.2

## 2014-09-01 ENCOUNTER — Encounter (HOSPITAL_COMMUNITY)
Admission: RE | Admit: 2014-09-01 | Discharge: 2014-09-01 | Disposition: A | Discharge status: Home / Self Care | Payer: Medicare Other | Source: Ambulatory Visit | Attending: Cardiovascular Disease | Admitting: Cardiovascular Disease

## 2014-09-01 DIAGNOSIS — I214 Non-ST elevation (NSTEMI) myocardial infarction: Secondary | ICD-10-CM | POA: Insufficient documentation

## 2014-09-01 DIAGNOSIS — Z951 Presence of aortocoronary bypass graft: Secondary | ICD-10-CM | POA: Diagnosis not present

## 2014-09-04 ENCOUNTER — Encounter (HOSPITAL_COMMUNITY)
Admission: RE | Admit: 2014-09-04 | Discharge: 2014-09-04 | Disposition: A | Discharge status: Home / Self Care | Payer: Medicare Other | Source: Ambulatory Visit | Attending: Cardiovascular Disease | Admitting: Cardiovascular Disease

## 2014-09-04 ENCOUNTER — Encounter: Payer: Self-pay | Admitting: Vascular Surgery

## 2014-09-04 DIAGNOSIS — Z951 Presence of aortocoronary bypass graft: Secondary | ICD-10-CM | POA: Diagnosis not present

## 2014-09-04 DIAGNOSIS — I214 Non-ST elevation (NSTEMI) myocardial infarction: Secondary | ICD-10-CM | POA: Diagnosis not present

## 2014-09-05 ENCOUNTER — Encounter: Payer: Self-pay | Admitting: Vascular Surgery

## 2014-09-05 ENCOUNTER — Ambulatory Visit (INDEPENDENT_AMBULATORY_CARE_PROVIDER_SITE_OTHER): Payer: Medicare Other | Admitting: Vascular Surgery

## 2014-09-05 ENCOUNTER — Ambulatory Visit (HOSPITAL_COMMUNITY)
Admission: RE | Admit: 2014-09-05 | Discharge: 2014-09-05 | Disposition: A | Discharge status: Home / Self Care | Payer: Medicare Other | Source: Ambulatory Visit | Attending: Vascular Surgery | Admitting: Vascular Surgery

## 2014-09-05 VITALS — BP 137/69 | HR 53 | Resp 18 | Ht 69.5 in | Wt 246.0 lb

## 2014-09-05 DIAGNOSIS — Z48812 Encounter for surgical aftercare following surgery on the circulatory system: Secondary | ICD-10-CM | POA: Insufficient documentation

## 2014-09-05 DIAGNOSIS — I6522 Occlusion and stenosis of left carotid artery: Secondary | ICD-10-CM | POA: Diagnosis not present

## 2014-09-05 DIAGNOSIS — I779 Disorder of arteries and arterioles, unspecified: Secondary | ICD-10-CM | POA: Diagnosis not present

## 2014-09-05 DIAGNOSIS — I739 Peripheral vascular disease, unspecified: Secondary | ICD-10-CM

## 2014-09-05 DIAGNOSIS — I255 Ischemic cardiomyopathy: Secondary | ICD-10-CM

## 2014-09-05 NOTE — Progress Notes (Signed)
Subjective:     Patient ID: Caleb Wolf, male   DOB: [birth year], 74 y.o.   MRN: [id number]  HPI this 74 year old male returns for initial follow-up regarding his left carotid endarterectomy with resection of redundant segment and primary anastomosis and patch angioplasty which was performed 02/17/2014. 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 Since his surgery he has undergone coronary artery bypass grafting by Dr. Merilynn Finland is doing well from that standpoint. He denies any neurologic symptoms such as lateralizing weakness, aphasia, amaurosis fugax, diplopia, blurred vision, or syncope. He does have bilateral edema and wears elastic compression stockings.  Past Medical History  Diagnosis Date  . Coronary artery disease   . GERD (gastroesophageal reflux disease)   . Arthritis   . Hyperlipidemia     takes Simvastatin daily  . Carotid stenosis     07/04/11- 50-70%bilaterally- stable per Dr Willey Blade note  . Lower leg edema   . Lumbar disc disease   . Gout     takes Indocin daily as needed  . Enlarged prostate     takes Finasteride daily  . History of kidney stones   . PONV (postoperative nausea and vomiting)     also hard to urinate after surgery  . Sleep apnea     uses CPAP  . Hypertension     takes Clonidine,Verapamil,HCTZ,and Lisinopril daily  . Joint pain   . Chronic back pain   . History of colon polyps   . Urinary urgency     History  Substance Use Topics  . Smoking status: Former Smoker -- 2.00 packs/day for 25 years    Types: Cigarettes    Start date: 08/08/1960    Quit date: 06/02/1982  . Smokeless tobacco: Never Used     Comment: quit smoking 30+yrs ago  . Alcohol Use: 0.0 oz/week    0 Standard drinks or equivalent per week     Comment: occasionally     Family History  Problem Relation Age of Onset  . Allergies Mother   . Heart disease Mother   . Hypertension Mother   . Heart disease Father     MI    Allergies  Allergen  Reactions  . Codeine Sulfate Nausea Only     Current outpatient prescriptions:  .  amiodarone (PACERONE) 200 MG tablet, Take 200 mg by mouth daily., Disp: , Rfl:  .  aspirin 81 MG tablet, Take 81 mg by mouth daily., Disp: , Rfl:  .  atorvastatin (LIPITOR) 40 MG tablet, Take 1 tablet (40 mg total) by mouth daily., Disp: 30 tablet, Rfl: 1 .  colchicine 0.6 MG tablet, Take 0.6 mg by mouth as needed., Disp: , Rfl:  .  diltiazem (CARDIZEM) 60 MG tablet, Take 1 tablet (60 mg total) by mouth 2 (two) times daily., Disp: 60 tablet, Rfl: 3 .  doxazosin (CARDURA) 2 MG tablet, Take 2 mg by mouth daily., Disp: , Rfl:  .  finasteride (PROSCAR) 5 MG tablet, Take 5 mg by mouth at bedtime. , Disp: , Rfl:  .  furosemide (LASIX) 40 MG tablet, Take 1 tablet (40 mg total) by mouth daily., Disp: 30 tablet, Rfl: 3 .  lisinopril (PRINIVIL,ZESTRIL) 5 MG tablet, Take 1 tablet (5 mg total) by mouth daily., Disp: 90 tablet, Rfl: 3 .  metoprolol tartrate (LOPRESSOR) 25 MG tablet, Take 1 tablet (25 mg total) by mouth 2 (two) times daily., Disp: 60 tablet, Rfl: 6 .  potassium chloride SA (KLOR-CON M20)  20 MEQ tablet, Take 1 tablet (20 mEq total) by mouth daily., Disp: 30 tablet, Rfl: 3 .  TESTIM 50 MG/5GM GEL, daily as needed. Apply 2 clicks as needed up three times weekly, Disp: , Rfl:  .  warfarin (COUMADIN) 5 MG tablet, Take 1 1/2 tablets daily except 1 tablet on Mondays and Thursdays or as dirrected, Disp: 90 tablet, Rfl: 3  Filed Vitals:   09/05/14 1059 09/05/14 1101  BP: 128/59 137/69  Pulse: 49 53  Resp: 18   Height: 5' 9.5" (1.765 m)   Weight: 246 lb (111.585 kg)     Body mass index is 35.82 kg/(m^2).           Review of Systems denies chest pain, dyspnea on exertion, PND, orthopnea, hemoptysis. Other systems negative and a complete review of systems other than periodic gout and bilateral lower extremity edema    Objective:   Physical Exam BP 137/69 mmHg  Pulse 53  Resp 18  Ht 5' 9.5" (1.765 m)   Wt 246 lb (111.585 kg)  BMI 35.82 kg/m2  Gen.-alert and oriented x3 in no apparent distress HEENT normal for age Lungs no rhonchi or wheezing Cardiovascular regular rhythm no murmurs carotid pulses 3+ palpable no bruits audible Abdomen soft nontender no palpable masses Musculoskeletal free of  major deformities Skin clear -no rashes Neurologic normal Lower extremities 3+ femoral and dorsalis pedis pulses palpable bilaterally with 1+ edema bilaterally  Today I ordered a carotid duplex exam which I reviewed and interpreted. There is mild elevation of velocities of the left carotid endarterectomy site possibly due to contralateral right ICA occlusion. No recurrent plaque noted.      Assessment:     6 months status post left carotid endarterectomy with Dacron  patch angioplasty and resection of redundant carotid and primary reanastomosis-doing well 3 months post coronary artery bypass grafting by Dr. Roxan Hockey    Plan:     Return in 6 months with carotid duplex exam. Patient has known right ICA occlusion. Slightly elevated velocities at left carotid endarterectomy site-will follow-asymptomatic

## 2014-09-05 NOTE — Addendum Note (Signed)
Addended by: Dorthula Rue L on: 09/05/2014 01:46 PM   Modules accepted: Orders

## 2014-09-06 ENCOUNTER — Encounter (HOSPITAL_COMMUNITY)
Admission: RE | Admit: 2014-09-06 | Discharge: 2014-09-06 | Disposition: A | Discharge status: Home / Self Care | Payer: Medicare Other | Source: Ambulatory Visit | Attending: Cardiovascular Disease | Admitting: Cardiovascular Disease

## 2014-09-06 DIAGNOSIS — I214 Non-ST elevation (NSTEMI) myocardial infarction: Secondary | ICD-10-CM | POA: Diagnosis not present

## 2014-09-06 DIAGNOSIS — Z951 Presence of aortocoronary bypass graft: Secondary | ICD-10-CM | POA: Diagnosis not present

## 2014-09-08 ENCOUNTER — Encounter (HOSPITAL_COMMUNITY): Payer: Medicare Other

## 2014-09-11 ENCOUNTER — Encounter (HOSPITAL_COMMUNITY)
Admission: RE | Admit: 2014-09-11 | Discharge: 2014-09-11 | Disposition: A | Discharge status: Home / Self Care | Payer: Medicare Other | Source: Ambulatory Visit | Attending: Cardiovascular Disease | Admitting: Cardiovascular Disease

## 2014-09-11 DIAGNOSIS — Z951 Presence of aortocoronary bypass graft: Secondary | ICD-10-CM | POA: Diagnosis not present

## 2014-09-11 DIAGNOSIS — I214 Non-ST elevation (NSTEMI) myocardial infarction: Secondary | ICD-10-CM | POA: Diagnosis not present

## 2014-09-13 ENCOUNTER — Ambulatory Visit (INDEPENDENT_AMBULATORY_CARE_PROVIDER_SITE_OTHER): Payer: Medicare Other | Admitting: *Deleted

## 2014-09-13 ENCOUNTER — Encounter (HOSPITAL_COMMUNITY)
Admission: RE | Admit: 2014-09-13 | Discharge: 2014-09-13 | Disposition: A | Discharge status: Home / Self Care | Payer: Medicare Other | Source: Ambulatory Visit | Attending: Cardiovascular Disease | Admitting: Cardiovascular Disease

## 2014-09-13 DIAGNOSIS — I214 Non-ST elevation (NSTEMI) myocardial infarction: Secondary | ICD-10-CM | POA: Diagnosis not present

## 2014-09-13 DIAGNOSIS — Z951 Presence of aortocoronary bypass graft: Secondary | ICD-10-CM | POA: Diagnosis not present

## 2014-09-13 DIAGNOSIS — Z5181 Encounter for therapeutic drug level monitoring: Secondary | ICD-10-CM

## 2014-09-13 DIAGNOSIS — I4891 Unspecified atrial fibrillation: Secondary | ICD-10-CM | POA: Diagnosis not present

## 2014-09-13 LAB — POCT INR: INR: 2.6

## 2014-09-15 ENCOUNTER — Encounter: Payer: Self-pay | Admitting: Cardiovascular Disease

## 2014-09-15 ENCOUNTER — Ambulatory Visit (INDEPENDENT_AMBULATORY_CARE_PROVIDER_SITE_OTHER): Payer: Medicare Other | Admitting: Cardiovascular Disease

## 2014-09-15 ENCOUNTER — Encounter (HOSPITAL_COMMUNITY)
Admission: RE | Admit: 2014-09-15 | Discharge: 2014-09-15 | Disposition: A | Discharge status: Home / Self Care | Payer: Medicare Other | Source: Ambulatory Visit | Attending: Cardiovascular Disease | Admitting: Cardiovascular Disease

## 2014-09-15 VITALS — BP 116/46 | HR 52 | Ht 70.0 in | Wt 248.0 lb

## 2014-09-15 DIAGNOSIS — I255 Ischemic cardiomyopathy: Secondary | ICD-10-CM

## 2014-09-15 DIAGNOSIS — I480 Paroxysmal atrial fibrillation: Secondary | ICD-10-CM

## 2014-09-15 DIAGNOSIS — I5022 Chronic systolic (congestive) heart failure: Secondary | ICD-10-CM

## 2014-09-15 DIAGNOSIS — I5189 Other ill-defined heart diseases: Secondary | ICD-10-CM

## 2014-09-15 DIAGNOSIS — R600 Localized edema: Secondary | ICD-10-CM | POA: Diagnosis not present

## 2014-09-15 DIAGNOSIS — E785 Hyperlipidemia, unspecified: Secondary | ICD-10-CM

## 2014-09-15 DIAGNOSIS — R001 Bradycardia, unspecified: Secondary | ICD-10-CM

## 2014-09-15 DIAGNOSIS — I8390 Asymptomatic varicose veins of unspecified lower extremity: Secondary | ICD-10-CM

## 2014-09-15 DIAGNOSIS — I779 Disorder of arteries and arterioles, unspecified: Secondary | ICD-10-CM

## 2014-09-15 DIAGNOSIS — I868 Varicose veins of other specified sites: Secondary | ICD-10-CM

## 2014-09-15 DIAGNOSIS — I214 Non-ST elevation (NSTEMI) myocardial infarction: Secondary | ICD-10-CM | POA: Diagnosis not present

## 2014-09-15 DIAGNOSIS — I25812 Atherosclerosis of bypass graft of coronary artery of transplanted heart without angina pectoris: Secondary | ICD-10-CM

## 2014-09-15 DIAGNOSIS — I519 Heart disease, unspecified: Secondary | ICD-10-CM

## 2014-09-15 DIAGNOSIS — Z951 Presence of aortocoronary bypass graft: Secondary | ICD-10-CM

## 2014-09-15 DIAGNOSIS — I739 Peripheral vascular disease, unspecified: Secondary | ICD-10-CM

## 2014-09-15 NOTE — Patient Instructions (Signed)
Your physician wants you to follow-up in: 6 months with Dr. Bronson Ing. You will receive a reminder letter in the mail two months in advance. If you don't receive a letter, please call our office to schedule the follow-up appointment.  Your physician has recommended you make the following change in your medication:   STOP TAKING: Warfarin Diltiazem Amiodarone  Thank you for choosing [facility]!

## 2014-09-15 NOTE — Progress Notes (Signed)
Patient ID: Caleb Wolf, male   DOB: Nov 19, 1940, 74 y.o.   MRN: [id number]      SUBJECTIVE: Caleb Wolf returns for routine follow-up. He is doing well and denies chest pain, palpitations, and shortness of breath. His wife helps him adhere to a healthy diet. He finishes cardiac rehabilitation next week but is considering continuing the maintenance program. His leg swelling has been much better controlled and he is also wearing compression stockings. He has felt somewhat tired after mowing the lawn after sitting down. He is compliant with his CPAP. His heart rate is 52 bpm.   Review of Systems: As per "subjective", otherwise negative.  Allergies  Allergen Reactions  . Codeine Sulfate Nausea Only    Current Outpatient Prescriptions  Medication Sig Dispense Refill  . amiodarone (PACERONE) 200 MG tablet Take 200 mg by mouth daily.    Marland Kitchen aspirin 81 MG tablet Take 81 mg by mouth daily.    Marland Kitchen atorvastatin (LIPITOR) 40 MG tablet Take 1 tablet (40 mg total) by mouth daily. 30 tablet 1  . colchicine 0.6 MG tablet Take 0.6 mg by mouth as needed.    . diltiazem (CARDIZEM) 60 MG tablet Take 1 tablet (60 mg total) by mouth 2 (two) times daily. 60 tablet 3  . doxazosin (CARDURA) 2 MG tablet Take 2 mg by mouth daily.    . finasteride (PROSCAR) 5 MG tablet Take 5 mg by mouth at bedtime.     . furosemide (LASIX) 40 MG tablet Take 1 tablet (40 mg total) by mouth daily. 30 tablet 3  . lisinopril (PRINIVIL,ZESTRIL) 5 MG tablet Take 1 tablet (5 mg total) by mouth daily. 90 tablet 3  . metoprolol tartrate (LOPRESSOR) 25 MG tablet Take 1 tablet (25 mg total) by mouth 2 (two) times daily. 60 tablet 6  . potassium chloride SA (KLOR-CON M20) 20 MEQ tablet Take 1 tablet (20 mEq total) by mouth daily. 30 tablet 3  . warfarin (COUMADIN) 5 MG tablet Take 1 1/2 tablets daily except 1 tablet on Mondays and Thursdays or as dirrected 90 tablet 3   No current facility-administered medications for this visit.    Past Medical  History  Diagnosis Date  . Coronary artery disease   . GERD (gastroesophageal reflux disease)   . Arthritis   . Hyperlipidemia     takes Simvastatin daily  . Carotid stenosis     07/04/11- 50-70%bilaterally- stable per Caleb Wolf note  . Lower leg edema   . Lumbar disc disease   . Gout     takes Indocin daily as needed  . Enlarged prostate     takes Finasteride daily  . History of kidney stones   . PONV (postoperative nausea and vomiting)     also hard to urinate after surgery  . Sleep apnea     uses CPAP  . Hypertension     takes Clonidine,Verapamil,HCTZ,and Lisinopril daily  . Joint pain   . Chronic back pain   . History of colon polyps   . Urinary urgency     Past Surgical History  Procedure Laterality Date  . Fracture surgery      bilateral wrist fractures- one ORIF  . Rhinoplasty    . Joint replacement  2011    left knee  . Eye surgery      cataract extraction with repair macular tear , with IOL     right  . Total hip arthroplasty  12/08/2011    Procedure: TOTAL HIP ARTHROPLASTY;  Surgeon: Caleb Alf, MD;  Location: [facility];  Service: Orthopedics;  Laterality: Right;  . Tonsillectomy    . Wrist surgery Right 62yrs ago  . Lithotripsy    . Back surgery    . Wrist surgery Left   . Cataract surgery Right   . Colonoscopy    . 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 Endarterectomy Left 02/17/2014    Procedure: ENDARTERECTOMY CAROTID WITH PATCH ANGIOPLASTY;  Surgeon: Caleb Misty, MD;  Location: [place];  Service: Vascular;  Laterality: Left;  . Left heart catheterization with coronary angiogram N/A 05/22/2014    Procedure: LEFT HEART CATHETERIZATION WITH CORONARY ANGIOGRAM;  Surgeon: Caleb Man, MD;  Location: [facility] CATH LAB;  Service: Cardiovascular;  Laterality: N/A;  . Cardiac catheterization  05/22/2014    Procedure: IABP INSERTION;  Surgeon: Caleb Man, MD;  Location: [facility] CATH LAB;  Service: Cardiovascular;;  . Coronary artery bypass graft N/A  05/22/2014    Procedure: CORONARY ARTERY BYPASS GRAFTING (CABG) times three using left internal mammary and right saphenous vein.;  Surgeon: Caleb Nakayama, MD;  Location: [place];  Service: Open Heart Surgery;  Laterality: N/A;    History   Social History  . Marital Status: Married    Spouse Name: N/A  . Number of Children: N/A  . Years of Education: N/A   Occupational History  . retired     [employer]   Social History Main Topics  . Smoking status: Former Smoker -- 2.00 packs/day for 25 years    Types: Cigarettes    Start date: 08/08/1960    Quit date: 06/02/1982  . Smokeless tobacco: Never Used     Comment: quit smoking 30+yrs ago  . Alcohol Use: 0.0 oz/week    0 Standard drinks or equivalent per week     Comment: occasionally   . Drug Use: No  . Sexual Activity: Yes   Other Topics Concern  . Not on file   Social History Narrative     Filed Vitals:   09/15/14 1358  BP: 116/46  Pulse: 52  Height: 5\' 10"  (1.778 m)  Weight: 248 lb (112.492 kg)    PHYSICAL EXAM General: NAD HEENT: Normal. Neck: No JVD, no thyromegaly. Lungs: Clear to auscultation bilaterally with normal respiratory effort. CV: Nondisplaced PMI. Bradycardic, regular rhythm, normal S1/S2, no S3/S4, no murmur. Trace pretibial and periankle edema, wearing compression stockings.  Abdomen: Soft, obese, no distention.  Neurologic: Alert and oriented x 3.  Psych: Normal affect. Skin: Normal. Musculoskeletal: No gross deformities. Extremities: No clubbing or cyanosis.   ECG: Most recent ECG reviewed.      ASSESSMENT AND PLAN: 1. CAD with recent CABG: Symptomatically stable. Will continue cardiac rehabilitation. Continue therapy with aspirin 81 mg, metoprolol 25 mg twice daily, and Lipitor 40 mg.  2. Essential HTN: Well controlled on lisinopril 5 mg daily. No changes.  3. Hyperlipidemia: Lipids on 05/25/2014 demonstrated total cholesterol 116, triglycerides 113, HDL 38,  LDL 55. Continue Lipitor 40 mg daily.   4. Carotid artery stenosis s/p left CEA: Stable. Continue ASA and statin.   5. Sleep apnea: Uses CPAP.   6. Leg swelling/venous varicosities: Continue use of compression stockings and Lasix. He may try taking it prn.  7. Atrial fibrillation: Remains in a regular rhythm. He is currently taking amiodarone 200 mg daily and warfarin. I will d/c amiodarone, diltiazem, and warfarin.  8. Leg edema/chronic systolic heart failure: EF 40-45% by  TEE. Will continue Lasix 40 mg daily and KCl 20 meq daily. Encouraged continued compression stocking use. He may try taking Lasix prn.   Dispo: f/u 6 months.  Time spent: 40 minutes, of which greater than 50% was spent reviewing symptoms, relevant blood tests and studies, and discussing management plan with the patient.   Kate Sable, [name], F.A.C.C.

## 2014-09-15 NOTE — Addendum Note (Signed)
Addended by: Levonne Hubert on: 09/15/2014 02:48 PM   Modules accepted: Orders, Medications, Level of Service

## 2014-09-18 ENCOUNTER — Encounter (HOSPITAL_COMMUNITY)
Admission: RE | Admit: 2014-09-18 | Discharge: 2014-09-18 | Disposition: A | Discharge status: Home / Self Care | Payer: Medicare Other | Source: Ambulatory Visit | Attending: Cardiovascular Disease | Admitting: Cardiovascular Disease

## 2014-09-18 DIAGNOSIS — Z951 Presence of aortocoronary bypass graft: Secondary | ICD-10-CM | POA: Diagnosis not present

## 2014-09-18 DIAGNOSIS — I214 Non-ST elevation (NSTEMI) myocardial infarction: Secondary | ICD-10-CM | POA: Diagnosis not present

## 2014-09-18 NOTE — Progress Notes (Signed)
Cardiac Rehabilitation Program Outcomes Report   Orientation:  06/13/14 Graduate Date:  09/18/14 Discharge Date:  09/18/14 # of sessions completed: 36  Cardiologist: Bronson Ing Family MD:  Homero Fellers Time:  1100  A.  Exercise Program:  Tolerates exercise @ 2.50 METS for 15 minutes and Walk Test Results:  Post: 2.96 mets  B.  Mental Health:  Good mental attitude  C.  Education/Instruction/Skills  Accurately checks own pulse.  Rest:  53  Exercise:  98, Knows THR for exercise, Uses Perceived Exertion Scale and/or Dyspnea Scale and Attended all education classes  Home exercise given: 09/18/14, may join maintenance program  D.  Nutrition/Weight Control/Body Composition:  Adherence to prescribed nutrition program: good    E.  Blood Lipids    Lab Results  Component Value Date   CHOL 116 05/25/2014   HDL 38* 05/25/2014   LDLCALC 55 05/25/2014   TRIG 113 05/25/2014   CHOLHDL 3.1 05/25/2014    F.  Lifestyle Changes:  Making positive lifestyle changes  G.  Symptoms noted with exercise:  Asymptomatic  Report Completed By:  Stevphen Rochester RN    Comments:  Patient has graduated CR with 36 sessions.  Patient stated it has helped him to exercise more and eat healthier.  Patient is considering maintenance program.

## 2014-09-18 NOTE — Progress Notes (Signed)
Patient is discharged from [facility] program today, April 18th, 2016 with 36 sessions.  He achieved LTG of 30 minutes of aerobic exercise at max met level of 2.50.  All patient vitals are WNL.  Patient has met with dietician.  Discharge instructions have been reviewed in detail and patient expressed an understanding of material given.  Patient is considering the maintenance program. Cardiac Rehab will make 1 month, 6 month and 1 year call backs.  Patient had no complaints of any abnormal S/S or pain on their exit visit.  Patient finished post walk test.

## 2014-09-25 DIAGNOSIS — E291 Testicular hypofunction: Secondary | ICD-10-CM | POA: Diagnosis not present

## 2014-09-25 DIAGNOSIS — N401 Enlarged prostate with lower urinary tract symptoms: Secondary | ICD-10-CM | POA: Diagnosis not present

## 2014-09-25 DIAGNOSIS — N528 Other male erectile dysfunction: Secondary | ICD-10-CM | POA: Diagnosis not present

## 2014-09-26 DIAGNOSIS — I10 Essential (primary) hypertension: Secondary | ICD-10-CM | POA: Diagnosis not present

## 2014-09-26 DIAGNOSIS — Z23 Encounter for immunization: Secondary | ICD-10-CM | POA: Diagnosis not present

## 2014-09-26 DIAGNOSIS — I4891 Unspecified atrial fibrillation: Secondary | ICD-10-CM | POA: Diagnosis not present

## 2014-10-09 ENCOUNTER — Telehealth: Payer: Self-pay | Admitting: Cardiovascular Disease

## 2014-10-09 NOTE — Telephone Encounter (Signed)
Patient asking if he needed to take dental prophy for teeth cleaning as he had had an MI in past.I told him according to our protocol he does not.

## 2014-10-09 NOTE — Telephone Encounter (Signed)
Please call patient regarding pre medication prior to procedure. / tg

## 2014-10-16 ENCOUNTER — Other Ambulatory Visit (HOSPITAL_COMMUNITY): Payer: Self-pay | Admitting: Internal Medicine

## 2014-10-16 ENCOUNTER — Ambulatory Visit (HOSPITAL_COMMUNITY)
Admission: RE | Admit: 2014-10-16 | Discharge: 2014-10-16 | Disposition: A | Discharge status: Home / Self Care | Payer: Medicare Other | Source: Ambulatory Visit | Attending: Internal Medicine | Admitting: Internal Medicine

## 2014-10-16 DIAGNOSIS — J90 Pleural effusion, not elsewhere classified: Secondary | ICD-10-CM | POA: Diagnosis not present

## 2014-10-16 DIAGNOSIS — R05 Cough: Secondary | ICD-10-CM

## 2014-10-16 DIAGNOSIS — R059 Cough, unspecified: Secondary | ICD-10-CM

## 2014-10-16 DIAGNOSIS — R079 Chest pain, unspecified: Secondary | ICD-10-CM | POA: Diagnosis not present

## 2014-10-24 DIAGNOSIS — H35033 Hypertensive retinopathy, bilateral: Secondary | ICD-10-CM | POA: Diagnosis not present

## 2014-10-26 ENCOUNTER — Ambulatory Visit: Payer: Self-pay | Admitting: *Deleted

## 2014-10-26 DIAGNOSIS — I4891 Unspecified atrial fibrillation: Secondary | ICD-10-CM

## 2014-10-26 DIAGNOSIS — Z5181 Encounter for therapeutic drug level monitoring: Secondary | ICD-10-CM

## 2014-11-29 ENCOUNTER — Other Ambulatory Visit: Payer: Self-pay | Admitting: Cardiovascular Disease

## 2014-12-12 ENCOUNTER — Other Ambulatory Visit: Payer: Self-pay | Admitting: Cardiovascular Disease

## 2014-12-21 ENCOUNTER — Telehealth: Payer: Self-pay | Admitting: Internal Medicine

## 2014-12-21 DIAGNOSIS — H35372 Puckering of macula, left eye: Secondary | ICD-10-CM | POA: Diagnosis not present

## 2014-12-21 DIAGNOSIS — G4733 Obstructive sleep apnea (adult) (pediatric): Secondary | ICD-10-CM

## 2014-12-21 DIAGNOSIS — H26491 Other secondary cataract, right eye: Secondary | ICD-10-CM | POA: Diagnosis not present

## 2014-12-21 NOTE — Telephone Encounter (Signed)
Order placed for DME switch.  Patient aware that PCC's or homecare company will be the next to call him.  Nothing further needed.

## 2014-12-21 NOTE — Telephone Encounter (Signed)
Pt states that Medicare no longer covers for him to be with DME: [facility]  Pt needs new DME - would like to try [facility] to see if they are in network and if not then patient would like to use [facility].  Patient advised by insurance that [facility] and [facility] are also options.   Please advise Dr Annamaria Boots .thanks.

## 2014-12-21 NOTE — Telephone Encounter (Signed)
Ok to order change for CPAP services from [place] to [facility], etc, as requested.

## 2014-12-21 NOTE — Telephone Encounter (Signed)
LMTCB x1 for pt.  

## 2014-12-26 DIAGNOSIS — I10 Essential (primary) hypertension: Secondary | ICD-10-CM | POA: Diagnosis not present

## 2014-12-26 DIAGNOSIS — Z6835 Body mass index (BMI) 35.0-35.9, adult: Secondary | ICD-10-CM | POA: Diagnosis not present

## 2014-12-26 DIAGNOSIS — I2510 Atherosclerotic heart disease of native coronary artery without angina pectoris: Secondary | ICD-10-CM | POA: Diagnosis not present

## 2015-03-12 ENCOUNTER — Other Ambulatory Visit: Payer: Self-pay | Admitting: Cardiovascular Disease

## 2015-03-13 ENCOUNTER — Ambulatory Visit: Payer: Medicare Other | Admitting: Vascular Surgery

## 2015-03-13 ENCOUNTER — Encounter (HOSPITAL_COMMUNITY): Payer: Medicare Other

## 2015-03-15 ENCOUNTER — Encounter: Payer: Self-pay | Admitting: Vascular Surgery

## 2015-03-19 ENCOUNTER — Encounter: Payer: Self-pay | Admitting: Cardiovascular Disease

## 2015-03-19 ENCOUNTER — Ambulatory Visit (INDEPENDENT_AMBULATORY_CARE_PROVIDER_SITE_OTHER): Payer: Medicare Other | Admitting: Cardiovascular Disease

## 2015-03-19 VITALS — BP 122/68 | HR 63 | Ht 69.5 in | Wt 237.0 lb

## 2015-03-19 DIAGNOSIS — Z951 Presence of aortocoronary bypass graft: Secondary | ICD-10-CM | POA: Diagnosis not present

## 2015-03-19 DIAGNOSIS — N528 Other male erectile dysfunction: Secondary | ICD-10-CM

## 2015-03-19 DIAGNOSIS — I25812 Atherosclerosis of bypass graft of coronary artery of transplanted heart without angina pectoris: Secondary | ICD-10-CM | POA: Diagnosis not present

## 2015-03-19 DIAGNOSIS — I739 Peripheral vascular disease, unspecified: Secondary | ICD-10-CM

## 2015-03-19 DIAGNOSIS — I10 Essential (primary) hypertension: Secondary | ICD-10-CM

## 2015-03-19 DIAGNOSIS — I255 Ischemic cardiomyopathy: Secondary | ICD-10-CM

## 2015-03-19 DIAGNOSIS — Z5181 Encounter for therapeutic drug level monitoring: Secondary | ICD-10-CM | POA: Diagnosis not present

## 2015-03-19 DIAGNOSIS — I779 Disorder of arteries and arterioles, unspecified: Secondary | ICD-10-CM

## 2015-03-19 DIAGNOSIS — Z9889 Other specified postprocedural states: Secondary | ICD-10-CM

## 2015-03-19 DIAGNOSIS — I5189 Other ill-defined heart diseases: Secondary | ICD-10-CM

## 2015-03-19 DIAGNOSIS — E785 Hyperlipidemia, unspecified: Secondary | ICD-10-CM

## 2015-03-19 DIAGNOSIS — R600 Localized edema: Secondary | ICD-10-CM

## 2015-03-19 DIAGNOSIS — R001 Bradycardia, unspecified: Secondary | ICD-10-CM

## 2015-03-19 DIAGNOSIS — I519 Heart disease, unspecified: Secondary | ICD-10-CM

## 2015-03-19 DIAGNOSIS — I5022 Chronic systolic (congestive) heart failure: Secondary | ICD-10-CM

## 2015-03-19 DIAGNOSIS — I480 Paroxysmal atrial fibrillation: Secondary | ICD-10-CM

## 2015-03-19 DIAGNOSIS — G4730 Sleep apnea, unspecified: Secondary | ICD-10-CM

## 2015-03-19 MED ORDER — NITROGLYCERIN 0.4 MG SL SUBL: 0.4000 mg | SUBLINGUAL_TABLET | SUBLINGUAL | Status: DC | PRN

## 2015-03-19 MED ORDER — METOPROLOL TARTRATE 25 MG PO TABS: 12.5000 mg | ORAL_TABLET | Freq: Two times a day (BID) | ORAL | Status: DC

## 2015-03-19 NOTE — Patient Instructions (Signed)
Your physician wants you to follow-up in: 1 year with Dr Virgina Jock will receive a reminder letter in the mail two months in advance. If you don't receive a letter, please call our office to schedule the follow-up appointment.      DECREASE Metoprolol to 12.5 mg twice a day      Your physician recommends that you return for lab work in: FASTING lipids     Thank you for choosing [facility] !

## 2015-03-19 NOTE — Progress Notes (Signed)
Patient ID: Caleb Wolf, male   DOB: 1940/11/01, 74 y.o.   MRN: [id number]      SUBJECTIVE: Caleb Wolf presents for routine cardiovascular follow-up. He is here with his wife, who helps him adhere to a well balanced diet. He has been doing well and denies exertional chest pain and shortness of breath. He takes Lasix twice a week for leg swelling primarily in his left leg. He occasionally wears compression stockings. He sometimes has lightheadedness and dizziness after walking. He needs his nitroglycerin refilled. He says he has not had his lipids checked this year. He has several questions regarding erectile dysfunction and the use of medications for this. He sees a urologist.  Due to have carotid Dopplers this week.  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   Review of Systems: As per "subjective", otherwise negative.  Allergies  Allergen Reactions  . Codeine Sulfate Nausea Only    Current Outpatient Prescriptions  Medication Sig Dispense Refill  . aspirin 81 MG tablet Take 81 mg by mouth daily.    Marland Kitchen atorvastatin (LIPITOR) 40 MG tablet Take 1 tablet (40 mg total) by mouth daily. 30 tablet 1  . colchicine 0.6 MG tablet Take 0.6 mg by mouth as needed.    . doxazosin (CARDURA) 2 MG tablet Take 2 mg by mouth daily.    . finasteride (PROSCAR) 5 MG tablet Take 5 mg by mouth at bedtime.     . furosemide (LASIX) 40 MG tablet TAKE ONE TABLET BY MOUTH DAILY. (Patient taking differently: TAKE ONE TABLET BY MOUTH DAILY./ tues/fri) 30 tablet 6  . lisinopril (PRINIVIL,ZESTRIL) 5 MG tablet TAKE 1 TABLET BY MOUTH ONCE DAILY. 90 tablet 2  . metoprolol tartrate (LOPRESSOR) 25 MG tablet Take 1 tablet (25 mg total) by mouth 2 (two) times daily. 60 tablet 6  . potassium chloride SA (K-DUR,KLOR-CON) 20 MEQ tablet TAKE ONE TABLET BY MOUTH DAILY. (Patient taking  differently: No sig reported) 30 tablet 3  . nitroGLYCERIN (NITROSTAT) 0.4 MG SL tablet Place 1 tablet (0.4 mg total) under the tongue every 5 (five) minutes as needed for chest pain. 25 tablet 3   No current facility-administered medications for this visit.    Past Medical History  Diagnosis Date  . Coronary artery disease   . GERD (gastroesophageal reflux disease)   . Arthritis   . Hyperlipidemia     takes Simvastatin daily  . Carotid stenosis     07/04/11- 50-70%bilaterally- stable per Dr Willey Blade note  . Lower leg edema   . Lumbar disc disease   . Gout     takes Indocin daily as needed  . Enlarged prostate     takes Finasteride daily  . History of kidney stones   . PONV (postoperative nausea and vomiting)     also hard to urinate after surgery  . Sleep apnea     uses CPAP  . Hypertension     takes Clonidine,Verapamil,HCTZ,and Lisinopril daily  . Joint pain   . Chronic back pain   . History of colon polyps   . Urinary urgency     Past Surgical History  Procedure Laterality Date  . Fracture surgery      bilateral wrist fractures- one ORIF  . Rhinoplasty    . Joint replacement  2011    left knee  . Eye surgery  cataract extraction with repair macular tear , with IOL     right  . Total hip arthroplasty  12/08/2011    Procedure: TOTAL HIP ARTHROPLASTY;  Surgeon: Gearlean Alf, MD;  Location: [facility];  Service: Orthopedics;  Laterality: Right;  . Tonsillectomy    . Wrist surgery Right 11yrs ago  . Lithotripsy    . Back surgery    . Wrist surgery Left   . Cataract surgery Right   . Colonoscopy    . 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 Endarterectomy Left 02/17/2014    Procedure: ENDARTERECTOMY CAROTID WITH PATCH ANGIOPLASTY;  Surgeon: Mal Misty, MD;  Location: [place];  Service: Vascular;  Laterality: Left;  . Left heart catheterization with coronary angiogram N/A 05/22/2014    Procedure: LEFT HEART CATHETERIZATION WITH CORONARY ANGIOGRAM;   Surgeon: Leonie Man, MD;  Location: [facility] CATH LAB;  Service: Cardiovascular;  Laterality: N/A;  . Cardiac catheterization  05/22/2014    Procedure: IABP INSERTION;  Surgeon: Leonie Man, MD;  Location: [facility] CATH LAB;  Service: Cardiovascular;;  . Coronary artery bypass graft N/A 05/22/2014    Procedure: CORONARY ARTERY BYPASS GRAFTING (CABG) times three using left internal mammary and right saphenous vein.;  Surgeon: Melrose Nakayama, MD;  Location: [place];  Service: Open Heart Surgery;  Laterality: N/A;    Social History   Social History  . Marital Status: Married    Spouse Name: N/A  . Number of Children: N/A  . Years of Education: N/A   Occupational History  . retired     [employer]   Social History Main Topics  . Smoking status: Former Smoker -- 2.00 packs/day for 25 years    Types: Cigarettes    Start date: 08/08/1960    Quit date: 06/02/1982  . Smokeless tobacco: Never Used     Comment: quit smoking 30+yrs ago  . Alcohol Use: 0.0 oz/week    0 Standard drinks or equivalent per week     Comment: occasionally   . Drug Use: No  . Sexual Activity: Yes   Other Topics Concern  . Not on file   Social History Narrative     Filed Vitals:   03/19/15 1127  BP: 122/68  Pulse: 63  Height: 5' 9.5" (1.765 m)  Weight: 237 lb (107.502 kg)  SpO2: 90%    PHYSICAL EXAM General: NAD HEENT: Normal. Neck: No JVD, no thyromegaly. Lungs: Clear to auscultation bilaterally with normal respiratory effort. CV: Nondisplaced PMI. Bradycardic, regular rhythm, normal S1/S2, no S3/S4, no murmur. Trace pretibial and periankle edema.  Abdomen: Soft, obese, no distention.  Neurologic: Alert and oriented x 3.  Psych: Normal affect. Skin: Normal. Musculoskeletal: No gross deformities. Extremities: No clubbing or cyanosis.   ECG: Most recent ECG reviewed.      ASSESSMENT AND PLAN: 1. CAD with recent CABG: Symptomatically stable. Continue therapy with  aspirin 81 mg, metoprolol (will reduce to 12.5 mg twice daily), and Lipitor 40 mg. Will refill nitrolgycerin.  2. Essential HTN: Well controlled on lisinopril 5 mg daily. No changes.  3. Hyperlipidemia: Lipids on 05/25/2014 demonstrated total cholesterol 116, triglycerides 113, HDL 38, LDL 55. Continue Lipitor 40 mg daily. Will repeat a lipid panel.  4. Carotid artery stenosis s/p left CEA: Stable. Continue ASA and statin. Due to have Dopplers this week.  5. Sleep apnea: Uses CPAP.   6. Leg swelling/venous varicosities: Continue use of compression stockings and Lasix twice  weekly per his preference.  7. Atrial fibrillation: Remains in a regular rhythm. Occurred in post-operative setting. No longer on amiodarone and warfarin (I d/c at last visit).  8. Leg edema/chronic systolic heart failure: EF 40-45% by TEE. Will continue Lasix 40 mg twice weekly as per his preference and KCl 20 meq when doing so. Encouraged continued compression stocking use.   9. Erectile dysfunction: Encouraged not to take nitrates with Cialis.   Dispo: f/u 1 year.  Time spent: 40 minutes, of which greater than 50% was spent reviewing symptoms, relevant blood tests and studies, and discussing management plan with the patient.   Kate Sable, [name], F.A.C.C.

## 2015-03-20 ENCOUNTER — Ambulatory Visit (HOSPITAL_COMMUNITY)
Admission: RE | Admit: 2015-03-20 | Discharge: 2015-03-20 | Disposition: A | Discharge status: Home / Self Care | Payer: Medicare Other | Source: Ambulatory Visit | Attending: Vascular Surgery | Admitting: Vascular Surgery

## 2015-03-20 ENCOUNTER — Ambulatory Visit (INDEPENDENT_AMBULATORY_CARE_PROVIDER_SITE_OTHER): Payer: Medicare Other | Admitting: Vascular Surgery

## 2015-03-20 ENCOUNTER — Encounter: Payer: Self-pay | Admitting: Vascular Surgery

## 2015-03-20 VITALS — BP 135/84 | HR 84 | Temp 98.2°F | Resp 18 | Ht 70.5 in | Wt 239.0 lb

## 2015-03-20 DIAGNOSIS — Z23 Encounter for immunization: Secondary | ICD-10-CM | POA: Diagnosis not present

## 2015-03-20 DIAGNOSIS — I10 Essential (primary) hypertension: Secondary | ICD-10-CM | POA: Diagnosis not present

## 2015-03-20 DIAGNOSIS — I6522 Occlusion and stenosis of left carotid artery: Secondary | ICD-10-CM

## 2015-03-20 DIAGNOSIS — I6521 Occlusion and stenosis of right carotid artery: Secondary | ICD-10-CM | POA: Diagnosis not present

## 2015-03-20 DIAGNOSIS — I255 Ischemic cardiomyopathy: Secondary | ICD-10-CM

## 2015-03-20 DIAGNOSIS — Z48812 Encounter for surgical aftercare following surgery on the circulatory system: Secondary | ICD-10-CM | POA: Diagnosis not present

## 2015-03-20 DIAGNOSIS — E785 Hyperlipidemia, unspecified: Secondary | ICD-10-CM | POA: Insufficient documentation

## 2015-03-20 NOTE — Progress Notes (Signed)
Subjective:     Patient ID: Caleb Wolf, male   DOB: 1940/10/08, 74 y.o.   MRN: [id number]  HPI This 74 year old male returns for continued follow-up regarding his left carotid endarterectomy site. He has a known right ICA occlusion. He had also resection of a redundant segment of the left internal carotid artery with primary reanastomosis. He has had no neurologic symptoms since I performed his surgery in September 2015 in conjunction with coronary artery bypass grafting. He denies lateralizing weakness, aphasia, amaurosis fugax, diplopia, blurred vision, and syncope. He continues to take one aspirin per day. He denies lower extremity claudication symptoms. He is doing well from a cardiac standpoint.  Past Medical History  Diagnosis Date  . Coronary artery disease   . GERD (gastroesophageal reflux disease)   . Arthritis   . Hyperlipidemia     takes Simvastatin daily  . Carotid stenosis     07/04/11- 50-70%bilaterally- stable per Dr Willey Blade note  . Lower leg edema   . Lumbar disc disease   . Gout     takes Indocin daily as needed  . Enlarged prostate     takes Finasteride daily  . History of kidney stones   . PONV (postoperative nausea and vomiting)     also hard to urinate after surgery  . Sleep apnea     uses CPAP  . Hypertension     takes Clonidine,Verapamil,HCTZ,and Lisinopril daily  . Joint pain   . Chronic back pain   . History of colon polyps   . Urinary urgency     Social History  Substance Use Topics  . Smoking status: Former Smoker -- 2.00 packs/day for 25 years    Types: Cigarettes    Start date: 08/08/1960    Quit date: 06/02/1982  . Smokeless tobacco: Never Used     Comment: quit smoking 30+yrs ago  . Alcohol Use: 0.0 oz/week    0 Standard drinks or equivalent per week     Comment: occasionally     Family History  Problem Relation Age of Onset  . Allergies Mother   . Heart disease Mother   . Hypertension Mother   . Heart disease Father     MI     Allergies  Allergen Reactions  . Codeine Sulfate Nausea Only     Current outpatient prescriptions:  .  aspirin 81 MG tablet, Take 81 mg by mouth daily., Disp: , Rfl:  .  atorvastatin (LIPITOR) 40 MG tablet, Take 1 tablet (40 mg total) by mouth daily., Disp: 30 tablet, Rfl: 1 .  doxazosin (CARDURA) 2 MG tablet, Take 2 mg by mouth daily., Disp: , Rfl:  .  finasteride (PROSCAR) 5 MG tablet, Take 5 mg by mouth at bedtime. , Disp: , Rfl:  .  furosemide (LASIX) 40 MG tablet, TAKE ONE TABLET BY MOUTH DAILY. (Patient taking differently: TAKE ONE TABLET BY MOUTH DAILY./ tues/fri), Disp: 30 tablet, Rfl: 6 .  lisinopril (PRINIVIL,ZESTRIL) 5 MG tablet, TAKE 1 TABLET BY MOUTH ONCE DAILY., Disp: 90 tablet, Rfl: 2 .  metoprolol tartrate (LOPRESSOR) 25 MG tablet, Take 0.5 tablets (12.5 mg total) by mouth 2 (two) times daily., Disp: 90 tablet, Rfl: 3 .  nitroGLYCERIN (NITROSTAT) 0.4 MG SL tablet, Place 1 tablet (0.4 mg total) under the tongue every 5 (five) minutes as needed for chest pain., Disp: 25 tablet, Rfl: 3 .  potassium chloride SA (K-DUR,KLOR-CON) 20 MEQ tablet, TAKE ONE TABLET BY MOUTH DAILY. (Patient taking differently: No sig reported), Disp:  30 tablet, Rfl: 3 .  colchicine 0.6 MG tablet, Take 0.6 mg by mouth as needed., Disp: , Rfl:   Filed Vitals:   03/20/15 1512 03/20/15 1516  BP: 116/61 135/84  Pulse: 84 84  Temp: 98.2 F (36.8 C)   Resp: 18   Height: 5' 10.5" (1.791 m)   Weight: 239 lb (108.41 kg)   SpO2: 96%     Body mass index is 33.8 kg/(m^2).           Review of Systems  Denies chest pain, dyspnea on exertion, PND, orthopnea, hemoptysis. Does have  Gastroesophageal reflux disease and coronary artery disease-  See history of present illness     Objective:   Physical Exam BP 135/84 mmHg  Pulse 84  Temp(Src) 98.2 F (36.8 C)  Resp 18  Ht 5' 10.5" (1.791 m)  Wt 239 lb (108.41 kg)  BMI 33.80 kg/m2  SpO2 96%  Gen.-alert and oriented x3 in no apparent  distress HEENT normal for age Lungs no rhonchi or wheezing Cardiovascular regular rhythm no murmurs carotid pulses 3+ palpable  Soft bruit on the left Abdomen soft nontender no palpable masses Musculoskeletal free of  major deformities Skin clear -no rashes Neurologic normal Lower extremities 3+ femoral and dorsalis pedis pulses palpable bilaterally with no edema   today I ordered a carotid duplex exam which I reviewed and interpreted and compared to last year's exam. Patient has widely patent left carotid system where endarterectomy was performed and primary reanastomosed this was performed after resecting kinked artery. The velocities have decreased since last year's study.       Assessment:      widely patent left carotid endarterectomy site with known right ICA occlusion     Plan:      returning 1 year and see  nurse practitioner for follow-up carotid duplex exam unless patient develops neurologic symptoms in the interim Continue daily aspirin

## 2015-03-22 DIAGNOSIS — E785 Hyperlipidemia, unspecified: Secondary | ICD-10-CM | POA: Diagnosis not present

## 2015-03-23 LAB — LIPID PANEL
Cholesterol: 134 mg/dL (ref 125–200)
HDL: 41 mg/dL (ref >=40)
LDL Cholesterol: 75 mg/dL (ref <130)
TRIGLYCERIDES: 92 mg/dL (ref <150)
Total CHOL/HDL Ratio: 3.3 Ratio (ref <=5.0)
VLDL: 18 mg/dL (ref <30)

## 2015-03-26 NOTE — Addendum Note (Signed)
Addended by: Dorthula Rue L on: 03/26/2015 11:21 AM   Modules accepted: Orders

## 2015-04-06 DIAGNOSIS — N312 Flaccid neuropathic bladder, not elsewhere classified: Secondary | ICD-10-CM | POA: Insufficient documentation

## 2015-04-06 DIAGNOSIS — N529 Male erectile dysfunction, unspecified: Secondary | ICD-10-CM | POA: Diagnosis not present

## 2015-04-06 DIAGNOSIS — R6882 Decreased libido: Secondary | ICD-10-CM | POA: Diagnosis not present

## 2015-04-06 DIAGNOSIS — N3941 Urge incontinence: Secondary | ICD-10-CM | POA: Diagnosis not present

## 2015-04-06 DIAGNOSIS — E291 Testicular hypofunction: Secondary | ICD-10-CM | POA: Diagnosis not present

## 2015-04-06 DIAGNOSIS — N401 Enlarged prostate with lower urinary tract symptoms: Secondary | ICD-10-CM | POA: Diagnosis not present

## 2015-05-01 DIAGNOSIS — E785 Hyperlipidemia, unspecified: Secondary | ICD-10-CM | POA: Diagnosis not present

## 2015-05-01 DIAGNOSIS — Z79899 Other long term (current) drug therapy: Secondary | ICD-10-CM | POA: Diagnosis not present

## 2015-05-01 DIAGNOSIS — I10 Essential (primary) hypertension: Secondary | ICD-10-CM | POA: Diagnosis not present

## 2015-05-03 ENCOUNTER — Encounter: Payer: Self-pay | Admitting: Internal Medicine

## 2015-05-03 ENCOUNTER — Ambulatory Visit (INDEPENDENT_AMBULATORY_CARE_PROVIDER_SITE_OTHER): Payer: Medicare Other | Admitting: Internal Medicine

## 2015-05-03 VITALS — BP 124/76 | HR 72 | Ht 70.0 in | Wt 237.6 lb

## 2015-05-03 DIAGNOSIS — G4733 Obstructive sleep apnea (adult) (pediatric): Secondary | ICD-10-CM | POA: Diagnosis not present

## 2015-05-03 DIAGNOSIS — I255 Ischemic cardiomyopathy: Secondary | ICD-10-CM | POA: Diagnosis not present

## 2015-05-03 NOTE — Patient Instructions (Signed)
Order- DME [facility]   Continue CPAP, increase pressure to 11, mask of choice, humidifier, supplies AirView   Dx OSA   You were interested in small travel CPAP machines like  Transcend. You could check out prices on line at [email or web address]  Please call as needed

## 2015-05-03 NOTE — Progress Notes (Signed)
   Subjective:    Patient ID: Dionne Ano, male    DOB: July 15, 1940, 74 y.o.   MRN: [id number]  HPI  05/03/14- Dr Gwenette Greet The patient comes in today for follow-up of his obstructive sleep apnea. He is wearing CPAP compliantly by his download, and overall is doing fairly well with this device. He does have occasional mask leak with breakthrough apneas, and I have suggested that he consider a different mask. Of note, his weight is stable from the last visit.  05/03/2015-74 year old male followed for OSA, complicated by HBP, CAD/history MI/CABGI, carotid stenosis, osteoarthritis hip Former pt of [facility]; DME is Wears CPAP every night for about 6-9 hours.  NPSG 07/2012  AHI 15/ hr, CPAP to 10 CPAP / 10   / Advanced Download indicates adequate compliance with residual AHI 7.9. There is room to do a little better as discussed. Watching comfort issues with mask and pressure.  Review of Systems  Constitutional: Negative for fever and unexpected weight change.  HENT: Negative for congestion, dental problem, ear pain, nosebleeds, postnasal drip, rhinorrhea, sinus pressure, sneezing, sore throat and trouble swallowing.   Eyes: Negative for redness and itching.  Respiratory: Positive for shortness of breath. Negative for cough, chest tightness and wheezing.   Cardiovascular: Negative for palpitations and leg swelling.  Gastrointestinal: Negative for nausea and vomiting.  Genitourinary: Negative for dysuria.  Musculoskeletal: Negative for joint swelling.  Skin: Negative for rash.  Neurological: Negative for headaches.  Hematological: Does not bruise/bleed easily.  Psychiatric/Behavioral: Negative for dysphoric mood. The patient is not nervous/anxious.      Objective:  OBJ- Physical Exam General- Alert, Oriented, Affect-appropriate, Distress- none acute, + overweight Skin- rash-none, lesions- none, excoriation- none Lymphadenopathy- none Head- atraumatic            Eyes- Gross vision intact, PERRLA,  conjunctivae and secretions clear            Ears- Hearing, canals-normal            Nose- Clear, no-Septal dev, mucus, polyps, erosion, perforation             Throat- Mallampati III-IV, mucosa clear , drainage- none, tonsils- atrophic Neck- flexible , trachea midline, no stridor , thyroid nl, carotid no bruit Chest - symmetrical excursion , unlabored           Heart/CV- RRR , no murmur , no gallop  , no rub, nl s1 s2                           - JVD- none , edema- none, stasis changes- none, varices- none           Lung- clear to P&A, wheeze- none, cough- none , dullness-none, rub- none           Chest wall-  Abd-  Br/ Gen/ Rectal- Not done, not indicated Extrem- cyanosis- none, clubbing, none, atrophy- none, strength- nl Neuro- grossly intact to observation    Assessment & Plan:

## 2015-05-04 ENCOUNTER — Ambulatory Visit: Payer: Medicare Other | Admitting: Pulmonary Disease

## 2015-05-06 NOTE — Assessment & Plan Note (Signed)
Download shows 77% compliance with residual AHI 7.9. Compliance issues discussed. Plan-strive for weight loss. Increase pressure from 10-11 seeking better control

## 2015-05-08 DIAGNOSIS — R609 Edema, unspecified: Secondary | ICD-10-CM | POA: Diagnosis not present

## 2015-05-08 DIAGNOSIS — I2510 Atherosclerotic heart disease of native coronary artery without angina pectoris: Secondary | ICD-10-CM | POA: Diagnosis not present

## 2015-05-08 DIAGNOSIS — Z6835 Body mass index (BMI) 35.0-35.9, adult: Secondary | ICD-10-CM | POA: Diagnosis not present

## 2015-05-08 DIAGNOSIS — K219 Gastro-esophageal reflux disease without esophagitis: Secondary | ICD-10-CM | POA: Diagnosis not present

## 2015-05-09 ENCOUNTER — Encounter: Payer: Self-pay | Admitting: Internal Medicine

## 2015-05-21 DIAGNOSIS — L570 Actinic keratosis: Secondary | ICD-10-CM | POA: Diagnosis not present

## 2015-07-17 ENCOUNTER — Encounter (INDEPENDENT_AMBULATORY_CARE_PROVIDER_SITE_OTHER): Payer: Self-pay

## 2015-07-18 ENCOUNTER — Encounter (INDEPENDENT_AMBULATORY_CARE_PROVIDER_SITE_OTHER): Payer: Self-pay | Admitting: Internal Medicine

## 2015-07-18 ENCOUNTER — Other Ambulatory Visit (INDEPENDENT_AMBULATORY_CARE_PROVIDER_SITE_OTHER): Payer: Self-pay | Admitting: Internal Medicine

## 2015-07-18 ENCOUNTER — Ambulatory Visit (INDEPENDENT_AMBULATORY_CARE_PROVIDER_SITE_OTHER): Payer: Medicare Other | Admitting: Internal Medicine

## 2015-07-18 ENCOUNTER — Encounter (INDEPENDENT_AMBULATORY_CARE_PROVIDER_SITE_OTHER): Payer: Self-pay | Admitting: *Deleted

## 2015-07-18 VITALS — BP 146/64 | HR 64 | Temp 98.0°F | Ht 70.0 in | Wt 241.5 lb

## 2015-07-18 DIAGNOSIS — R1319 Other dysphagia: Secondary | ICD-10-CM | POA: Diagnosis not present

## 2015-07-18 NOTE — Patient Instructions (Signed)
The risks and benefits such as perforation, bleeding, and infection were reviewed with the patient and is agreeable. 

## 2015-07-18 NOTE — Progress Notes (Signed)
Subjective:    Patient ID: Caleb Wolf, male    DOB: 1940/09/18, 75 y.o.   MRN: [id number]  HPI Referred to our office by Dr.Fagan for dysphagia.  . When he sips water, water will hang. He says foods will sit in his esophagus. He keeps swallowing and the bolus will go down.  Symptoms x 2 months. He denies any heartburn. He does not have any trouble eating spicy foods. His appetite is good. There is no abdominal pain.  He usually has a BM about every other day. If he is diligent with his meats and bread he has no problems.   Hx significant for heart disease.     Review of Systems Past Medical History  Diagnosis Date  . Coronary artery disease   . GERD (gastroesophageal reflux disease)   . Arthritis   . Hyperlipidemia     takes Simvastatin daily  . Carotid stenosis     07/04/11- 50-70%bilaterally- stable per Dr Willey Blade note  . Lower leg edema   . Lumbar disc disease   . Gout     takes Indocin daily as needed  . Enlarged prostate     takes Finasteride daily  . History of kidney stones   . PONV (postoperative nausea and vomiting)     also hard to urinate after surgery  . Sleep apnea     uses CPAP  . Hypertension     takes Clonidine,Verapamil,HCTZ,and Lisinopril daily  . Joint pain   . Chronic back pain   . History of colon polyps   . Urinary urgency     Past Surgical History  Procedure Laterality Date  . Fracture surgery      bilateral wrist fractures- one ORIF  . Rhinoplasty    . Joint replacement  2011    left knee  . Eye surgery      cataract extraction with repair macular tear , with IOL     right  . Total hip arthroplasty  12/08/2011    Procedure: TOTAL HIP ARTHROPLASTY;  Surgeon: Gearlean Alf, MD;  Location: [facility];  Service: Orthopedics;  Laterality: Right;  . Tonsillectomy    . Wrist surgery Right 81yrs ago  . Lithotripsy    . Back surgery    . Wrist surgery Left   . Cataract surgery Right   . Colonoscopy    . Pars plana vitrectomy w/ repair of macular  hole    . Endarterectomy Left 02/17/2014    Procedure: ENDARTERECTOMY CAROTID WITH PATCH ANGIOPLASTY;  Surgeon: Mal Misty, MD;  Location: [facility];  Service: Vascular;  Laterality: Left;  . Left heart catheterization with coronary angiogram N/A 05/22/2014    Procedure: LEFT HEART CATHETERIZATION WITH CORONARY ANGIOGRAM;  Surgeon: Leonie Man, MD;  Location: [facility] CATH LAB;  Service: Cardiovascular;  Laterality: N/A;  . Cardiac catheterization  05/22/2014    Procedure: IABP INSERTION;  Surgeon: Leonie Man, MD;  Location: [facility] CATH LAB;  Service: Cardiovascular;;  . Coronary artery bypass graft N/A 05/22/2014    Procedure: CORONARY ARTERY BYPASS GRAFTING (CABG) times three using left internal mammary and right saphenous vein.;  Surgeon: Melrose Nakayama, MD;  Location: [facility];  Service: Open Heart Surgery;  Laterality: N/A;    Allergies  Allergen Reactions  . Codeine Sulfate Nausea Only    Current Outpatient Prescriptions on File Prior to Visit  Medication Sig Dispense Refill  . aspirin 81 MG tablet Take 81 mg by mouth daily.    Marland Kitchen  atorvastatin (LIPITOR) 40 MG tablet Take 1 tablet (40 mg total) by mouth daily. 30 tablet 1  . colchicine 0.6 MG tablet Take 0.6 mg by mouth as needed.    . finasteride (PROSCAR) 5 MG tablet Take 5 mg by mouth at bedtime.     . furosemide (LASIX) 40 MG tablet TAKE ONE TABLET BY MOUTH DAILY. (Patient taking differently: TAKE ONE TABLET BY MOUTH DAILY./  Mon-Thur-Satu) 30 tablet 6  . lisinopril (PRINIVIL,ZESTRIL) 5 MG tablet TAKE 1 TABLET BY MOUTH ONCE DAILY. 90 tablet 2  . metoprolol tartrate (LOPRESSOR) 25 MG tablet Take 0.5 tablets (12.5 mg total) by mouth 2 (two) times daily. 90 tablet 3  . nitroGLYCERIN (NITROSTAT) 0.4 MG SL tablet Place 1 tablet (0.4 mg total) under the tongue every 5 (five) minutes as needed for chest pain. 25 tablet 3  . potassium chloride SA (K-DUR,KLOR-CON) 20 MEQ tablet TAKE ONE TABLET BY MOUTH DAILY. (Patient taking differently: No  sig reported) 30 tablet 3   No current facility-administered medications on file prior to visit.        Objective:   Physical Exam Blood pressure 146/64, pulse 64, temperature 98 F (36.7 C), height 5\' 10"  (1.778 m), weight 241 lb 8 oz (109.544 kg). Alert and oriented. Skin warm and dry. Oral mucosa is moist.   . Sclera anicteric, conjunctivae is pink. Thyroid not enlarged. No cervical lymphadenopathy. Lungs clear. Heart regular rate and rhythm.  Abdomen is soft. Bowel sounds are positive. No hepatomegaly. No abdominal masses felt. No tenderness. 1+ edema to lower left extremity. No edema to rt extremity.        Assessment & Plan:  Dysphagia. Patient needs an EGD/ED. Esophageal stricture needs to be ruled out.  The risks and benefits such as perforation, bleeding, and infection were reviewed with the patient and is agreeable.

## 2015-08-22 ENCOUNTER — Ambulatory Visit (HOSPITAL_COMMUNITY)
Admission: RE | Admit: 2015-08-22 | Discharge: 2015-08-22 | Disposition: A | Discharge status: Home / Self Care | Payer: Medicare Other | Source: Ambulatory Visit | Attending: Internal Medicine | Admitting: Internal Medicine

## 2015-08-22 ENCOUNTER — Encounter (HOSPITAL_COMMUNITY): Payer: Self-pay | Admitting: *Deleted

## 2015-08-22 ENCOUNTER — Encounter (HOSPITAL_COMMUNITY)
Admission: RE | Disposition: A | Discharge status: Home / Self Care | Payer: Self-pay | Source: Ambulatory Visit | Attending: Internal Medicine

## 2015-08-22 DIAGNOSIS — K228 Other specified diseases of esophagus: Secondary | ICD-10-CM | POA: Diagnosis not present

## 2015-08-22 DIAGNOSIS — G4730 Sleep apnea, unspecified: Secondary | ICD-10-CM | POA: Diagnosis not present

## 2015-08-22 DIAGNOSIS — I2510 Atherosclerotic heart disease of native coronary artery without angina pectoris: Secondary | ICD-10-CM | POA: Insufficient documentation

## 2015-08-22 DIAGNOSIS — K2980 Duodenitis without bleeding: Secondary | ICD-10-CM | POA: Insufficient documentation

## 2015-08-22 DIAGNOSIS — Z79899 Other long term (current) drug therapy: Secondary | ICD-10-CM | POA: Insufficient documentation

## 2015-08-22 DIAGNOSIS — E785 Hyperlipidemia, unspecified: Secondary | ICD-10-CM | POA: Diagnosis not present

## 2015-08-22 DIAGNOSIS — K210 Gastro-esophageal reflux disease with esophagitis: Secondary | ICD-10-CM | POA: Insufficient documentation

## 2015-08-22 DIAGNOSIS — R12 Heartburn: Secondary | ICD-10-CM | POA: Diagnosis not present

## 2015-08-22 DIAGNOSIS — Z87891 Personal history of nicotine dependence: Secondary | ICD-10-CM | POA: Diagnosis not present

## 2015-08-22 DIAGNOSIS — Z7982 Long term (current) use of aspirin: Secondary | ICD-10-CM | POA: Insufficient documentation

## 2015-08-22 DIAGNOSIS — M1991 Primary osteoarthritis, unspecified site: Secondary | ICD-10-CM | POA: Diagnosis not present

## 2015-08-22 DIAGNOSIS — Z96641 Presence of right artificial hip joint: Secondary | ICD-10-CM | POA: Diagnosis not present

## 2015-08-22 DIAGNOSIS — R1310 Dysphagia, unspecified: Secondary | ICD-10-CM | POA: Insufficient documentation

## 2015-08-22 DIAGNOSIS — K219 Gastro-esophageal reflux disease without esophagitis: Secondary | ICD-10-CM | POA: Insufficient documentation

## 2015-08-22 DIAGNOSIS — I10 Essential (primary) hypertension: Secondary | ICD-10-CM | POA: Diagnosis not present

## 2015-08-22 DIAGNOSIS — K209 Esophagitis, unspecified: Secondary | ICD-10-CM | POA: Diagnosis not present

## 2015-08-22 DIAGNOSIS — R1319 Other dysphagia: Secondary | ICD-10-CM

## 2015-08-22 HISTORY — PX: ESOPHAGOGASTRODUODENOSCOPY: SHX5428

## 2015-08-22 HISTORY — PX: ESOPHAGEAL DILATION: SHX303

## 2015-08-22 SURGERY — EGD (ESOPHAGOGASTRODUODENOSCOPY)
Anesthesia: Moderate Sedation

## 2015-08-22 MED ORDER — BUTAMBEN-TETRACAINE-BENZOCAINE 2-2-14 % EX AERO
INHALATION_SPRAY | CUTANEOUS | Status: DC | PRN
  Administered 2015-08-22: 2 via TOPICAL

## 2015-08-22 MED ORDER — SODIUM CHLORIDE 0.9 % IV SOLN
INTRAVENOUS | Status: DC
  Administered 2015-08-22: 1000 mL via INTRAVENOUS

## 2015-08-22 MED ORDER — MEPERIDINE HCL 50 MG/ML IJ SOLN
INTRAMUSCULAR | Status: AC
  Filled 2015-08-22: qty 1

## 2015-08-22 MED ORDER — MIDAZOLAM HCL 5 MG/5ML IJ SOLN
INTRAMUSCULAR | Status: AC
  Filled 2015-08-22: qty 10

## 2015-08-22 MED ORDER — PANTOPRAZOLE SODIUM 40 MG PO TBEC: 40.0000 mg | DELAYED_RELEASE_TABLET | Freq: Every day | ORAL | Status: DC

## 2015-08-22 MED ORDER — MEPERIDINE HCL 50 MG/ML IJ SOLN
INTRAMUSCULAR | Status: DC | PRN
  Administered 2015-08-22 (×2): 25 mg via INTRAVENOUS

## 2015-08-22 MED ORDER — MIDAZOLAM HCL 5 MG/5ML IJ SOLN
INTRAMUSCULAR | Status: DC | PRN
  Administered 2015-08-22 (×3): 2 mg via INTRAVENOUS
  Administered 2015-08-22: 1 mg via INTRAVENOUS

## 2015-08-22 NOTE — Discharge Instructions (Signed)
Resume aspirin on 08/23/2015. Resume other medications as before. Pantoprazole 40 mg by mouth 30 minutes before breakfast daily. No driving for 24 hours. Physician will call with results of biopsy and blood test.  Gastrointestinal Endoscopy, Care After Refer to this sheet in the next few weeks. These instructions provide you with information on caring for yourself after your procedure. Your caregiver may also give you more specific instructions. Your treatment has been planned according to current medical practices, but problems sometimes occur. Call your caregiver if you have any problems or questions after your procedure. HOME CARE INSTRUCTIONS  If you were given medicine to help you relax (sedative), do not drive, operate machinery, or sign important documents for 24 hours.  Avoid alcohol and hot or warm beverages for the first 24 hours after the procedure.  Only take over-the-counter or prescription medicines for pain, discomfort, or fever as directed by your caregiver. You may resume taking your normal medicines unless your caregiver tells you otherwise. Ask your caregiver when you may resume taking medicines that may cause bleeding, such as aspirin, clopidogrel, or warfarin.  You may return to your normal diet and activities on the day after your procedure, or as directed by your caregiver. Walking may help to reduce any bloated feeling in your abdomen.  Drink enough fluids to keep your urine clear or pale yellow.  You may gargle with salt water if you have a sore throat. SEEK IMMEDIATE MEDICAL CARE IF:  You have severe nausea or vomiting.  You have severe abdominal pain, abdominal cramps that last longer than 6 hours, or abdominal swelling (distention).  You have severe shoulder or back pain.  You have trouble swallowing.  You have shortness of breath, your breathing is shallow, or you are breathing faster than normal.  You have a fever or a rapid heartbeat.  You vomit blood  or material that looks like coffee grounds.  You have bloody, black, or tarry stools. MAKE SURE YOU:  Understand these instructions.  Will watch your condition.  Will get help right away if you are not doing well or get worse.   This information is not intended to replace advice given to you by your health care provider. Make sure you discuss any questions you have with your health care provider.   Document Released: 01/01/2004 Document Revised: 06/09/2014 Document Reviewed: 08/19/2011 Elsevier Interactive Patient Education [employer].

## 2015-08-22 NOTE — H&P (Signed)
GENTLE BOGLE is an 75 y.o. male.   Chief Complaint: Patient is here for EGD and ED. HPI: Patient is 76 year old Caucasian male who presents with 6 month history of dysphagia to solids. He has had change in his symptoms over the last 2 months. He is having chew his food thoroughly. He points to mid sternal area soft bolus obstruction. He is having difficulty with liquids sometimes. He has had intermittent heartburn for few years. He's been using OTC medications. He has not had any episode of food impaction. He states he has lost 35 pounds since his cardiac surgery. Weight loss is voluntary. He denies abdominal pain melena or rectal bleeding.    Past Medical History  Diagnosis Date  . Coronary artery disease   . GERD (gastroesophageal reflux disease)   . Arthritis   . Hyperlipidemia     takes Simvastatin daily  . Carotid stenosis     07/04/11- 50-70%bilaterally- stable per Dr Willey Blade note  . Lower leg edema   . Lumbar disc disease   . Gout     takes Indocin daily as needed  . Enlarged prostate     takes Finasteride daily  . History of kidney stones   . PONV (postoperative nausea and vomiting)     also hard to urinate after surgery  . Sleep apnea     uses CPAP  . Hypertension     takes Clonidine,Verapamil,HCTZ,and Lisinopril daily  . Joint pain   . Chronic back pain   . History of colon polyps   . Urinary urgency     Past Surgical History  Procedure Laterality Date  . Fracture surgery      bilateral wrist fractures- one ORIF  . Rhinoplasty    . Joint replacement  2011    left knee  . Eye surgery      cataract extraction with repair macular tear , with IOL     right  . Total hip arthroplasty  12/08/2011    Procedure: TOTAL HIP ARTHROPLASTY;  Surgeon: Gearlean Alf, MD;  Location: [facility];  Service: Orthopedics;  Laterality: Right;  . Tonsillectomy    . Wrist surgery Right 75yrs ago  . Lithotripsy    . Back surgery    . Wrist surgery Left   . Cataract surgery Right   .  Colonoscopy    . 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 Endarterectomy Left 02/17/2014    Procedure: ENDARTERECTOMY CAROTID WITH PATCH ANGIOPLASTY;  Surgeon: Mal Misty, MD;  Location: [facility];  Service: Vascular;  Laterality: Left;  . Left heart catheterization with coronary angiogram N/A 05/22/2014    Procedure: LEFT HEART CATHETERIZATION WITH CORONARY ANGIOGRAM;  Surgeon: Leonie Man, MD;  Location: [facility] CATH LAB;  Service: Cardiovascular;  Laterality: N/A;  . Cardiac catheterization  05/22/2014    Procedure: IABP INSERTION;  Surgeon: Leonie Man, MD;  Location: [facility] CATH LAB;  Service: Cardiovascular;;  . Coronary artery bypass graft N/A 05/22/2014    Procedure: CORONARY ARTERY BYPASS GRAFTING (CABG) times three using left internal mammary and right saphenous vein.;  Surgeon: Melrose Nakayama, MD;  Location: [facility];  Service: Open Heart Surgery;  Laterality: N/A;    Family History  Problem Relation Age of Onset  . Allergies Mother   . Heart disease Mother   . Hypertension Mother   . Heart disease Father     MI   Social History:  reports that he quit smoking  about 33 years ago. His smoking use included Cigarettes. He started smoking about 55 years ago. He has a 50 pack-year smoking history. He has never used smokeless tobacco. He reports that he drinks alcohol. He reports that he does not use illicit drugs.  Allergies:  Allergies  Allergen Reactions  . Codeine Sulfate Nausea Only    Medications Prior to Admission  Medication Sig Dispense Refill  . aspirin 81 MG tablet Take 81 mg by mouth daily.    Marland Kitchen atorvastatin (LIPITOR) 40 MG tablet Take 1 tablet (40 mg total) by mouth daily. 30 tablet 1  . doxazosin (CARDURA) 2 MG tablet Take 2 mg by mouth daily.    . finasteride (PROSCAR) 5 MG tablet Take 5 mg by mouth at bedtime.     . furosemide (LASIX) 40 MG tablet TAKE ONE TABLET BY MOUTH DAILY. (Patient taking differently: TAKE ONE TABLET BY MOUTH DAILY./   Mon-Thur-Satu) 30 tablet 6  . lisinopril (PRINIVIL,ZESTRIL) 5 MG tablet TAKE 1 TABLET BY MOUTH ONCE DAILY. 90 tablet 2  . metoprolol tartrate (LOPRESSOR) 25 MG tablet Take 0.5 tablets (12.5 mg total) by mouth 2 (two) times daily. 90 tablet 3  . potassium chloride SA (K-DUR,KLOR-CON) 20 MEQ tablet TAKE ONE TABLET BY MOUTH DAILY. (Patient taking differently: No sig reported) 30 tablet 3  . colchicine 0.6 MG tablet Take 0.6 mg by mouth as needed.    . nitroGLYCERIN (NITROSTAT) 0.4 MG SL tablet Place 1 tablet (0.4 mg total) under the tongue every 5 (five) minutes as needed for chest pain. 25 tablet 3    No results found for this or any previous visit (from the past 48 hour(s)). No results found.  ROS  Blood pressure 141/71, pulse 53, temperature 97.5 F (36.4 C), temperature source Oral, resp. rate 17, height 5\' 10"  (1.778 m), weight 233 lb (105.688 kg), SpO2 99 %. Physical Exam  Constitutional: He appears well-developed and well-nourished.  HENT:  Mouth/Throat: Oropharynx is clear and moist.  Eyes: Conjunctivae are normal. No scleral icterus.  Neck: No thyromegaly present.  Cardiovascular: Normal rate, regular rhythm and normal heart sounds.   No murmur heard. Respiratory: Effort normal.  GI: Soft. He exhibits no distension and no mass. There is no tenderness.  Musculoskeletal:  1-2+ pitting edema involving left leg(chronic)  Lymphadenopathy:    He has no cervical adenopathy.  Neurological: He is alert.  Skin: Skin is warm and dry.     Assessment/Plan Dysphagia to solids as well as liquids. History of heartburn. EGD with ED.  Rogene Houston, MD 08/22/2015, 2:29 PM

## 2015-08-23 LAB — H. PYLORI ANTIBODY, IGG

## 2015-08-23 NOTE — Op Note (Signed)
[facility] Patient Name: Caleb Wolf Procedure Date: 08/22/2015 2:26 PM MRN: [id number] Date of Birth: 16-Nov-1940 Attending MD: Hildred Laser , MD CSN: [id number] Age: 75 Admit Type: Outpatient Procedure:                Upper GI endoscopy Indications:              Dysphagia, Heartburn Providers:                Hildred Laser, MD, Lurline Del, RN, Georgeann Oppenheim,                            Technician Referring MD:             Paula Compton. Willey Blade, MD (Referring MD) Medicines:                Cetacaine spray, Meperidine 50 mg IV, Midazolam 7                            mg IV Complications:            No immediate complications. Estimated Blood Loss:     Estimated blood loss was minimal. Procedure:                Pre-Anesthesia Assessment:                           - Prior to the procedure, a History and Physical                            was performed, and patient medications and                            allergies were reviewed. The patient's tolerance of                            previous anesthesia was also reviewed. The risks                            and benefits of the procedure and the sedation                            options and risks were discussed with the patient.                            All questions were answered, and informed consent                            was obtained. Prior Anticoagulants: The patient                            last took aspirin 3 days prior to the procedure.                            ASA Grade Assessment: II - A patient with mild  systemic disease. After reviewing the risks and                            benefits, the patient was deemed in satisfactory                            condition to undergo the procedure.                           After obtaining informed consent, the endoscope was                            passed under direct vision. Throughout the                            procedure, the patient's blood  pressure, pulse, and                            oxygen saturations were monitored continuously. The                            [id number]) scope was introduced through the                            mouth, and advanced to the second part of duodenum.                            The upper GI endoscopy was accomplished without                            difficulty. The patient tolerated the procedure                            well. Scope In: 2:41:12 PM Scope Out: 2:55:14 PM Total Procedure Duration: 0 hours 14 minutes 2 seconds  Findings:      The upper third of the esophagus was normal.      Diffuse mucosal changes characterized by longitudinal markings,       nodularity and vertical lines were found in the middle third of the       esophagus and in the lower third of the esophagus.      The Z-line was regular and was found 43 cm from the incisors. The scope       was withdrawn. Dilation was performed with a Maloney dilator with no       resistance at 56 Fr. The dilation site was examined following endoscope       reinsertion and showed no change. Biopsies were taken with a cold       forceps for histology. Biopsies were taken with a cold forceps for       histology. Biopsies were taken with a cold forceps for histology.      The entire examined stomach was normal.      The cardia and gastric fundus were normal on retroflexion.      Patchy mildly congested mucosa without active bleeding and with no       stigmata of  bleeding was found in the duodenal bulb.      No gross lesions were noted in the second portion of the duodenum.      Patchy mild inflammation characterized by erythema and friability was       found in the duodenal bulb. Impression:               - Normal upper third of esophagus.                           - Longitudinally marked, vertically lined mucosa in                            the esophagus.                           - Z-line regular, 43 cm from the incisors  with mild                            changes of reflux esophagitis. Esophagus dilatedby                            passing 56 French Maloney dilator but no mucosal                            disruption noted. Esophageal mucosa Biopsied.                           - Normal stomach.                           - bulbar duodenitis.                           - No gross lesions in the second portion of the                            duodenum. Moderate Sedation:      Moderate (conscious) sedation was administered by the endoscopy nurse       and supervised by the endoscopist. The following parameters were       monitored: oxygen saturation, heart rate, blood pressure, CO2       capnography and response to care. Total physician intraservice time was       22 minutes. Recommendation:           - Patient has a contact number available for                            emergencies. The signs and symptoms of potential                            delayed complications were discussed with the                            patient. Return to normal activities tomorrow.  Written discharge instructions were provided to the                            patient.                           - Resume previous diet.                           - Resume aspirin at prior dose tomorrow.                           - Use Protonix (pantoprazole) 40 mg PO daily today.                           - Perform an H. pylori serology today.                           - Await pathology results. Procedure Code(s):        --- Professional ---                           [phone], Esophagogastroduodenoscopy, flexible,                            transoral; with biopsy, single or multiple                           43450, Dilation of esophagus, by unguided sound or                            bougie, single or multiple passes                           99152, Moderate sedation services provided by the                             same physician or other qualified health care                            professional performing the diagnostic or                            therapeutic service that the sedation supports,                            requiring the presence of an independent trained                            observer to assist in the monitoring of the                            patient's level of consciousness and physiological                            status; initial  15 minutes of intraservice time,                            patient age 24 years or older Diagnosis Code(s):        --- Professional ---                           K22.8, Other specified diseases of esophagus                           K31.89, Other diseases of stomach and duodenum                           R13.10, Dysphagia, unspecified                           R12, Heartburn CPT copyright 2016 American Medical Association. All rights reserved. The codes documented in this report are preliminary and upon coder review may  be revised to meet current compliance requirements. Hildred Laser, MD Hildred Laser, MD 08/22/2015 3:34:17 PM This report has been signed electronically. Number of Addenda: 0

## 2015-08-27 ENCOUNTER — Encounter (HOSPITAL_COMMUNITY): Payer: Self-pay | Admitting: Internal Medicine

## 2015-09-04 ENCOUNTER — Other Ambulatory Visit (INDEPENDENT_AMBULATORY_CARE_PROVIDER_SITE_OTHER): Payer: Self-pay | Admitting: Internal Medicine

## 2015-09-04 DIAGNOSIS — R1310 Dysphagia, unspecified: Secondary | ICD-10-CM

## 2015-09-05 ENCOUNTER — Ambulatory Visit (HOSPITAL_COMMUNITY)
Admission: RE | Admit: 2015-09-05 | Discharge: 2015-09-05 | Disposition: A | Discharge status: Home / Self Care | Payer: Medicare Other | Source: Ambulatory Visit | Attending: Internal Medicine | Admitting: Internal Medicine

## 2015-09-05 DIAGNOSIS — R1310 Dysphagia, unspecified: Secondary | ICD-10-CM | POA: Insufficient documentation

## 2015-09-07 ENCOUNTER — Other Ambulatory Visit (HOSPITAL_COMMUNITY): Payer: Medicare Other

## 2015-09-25 DIAGNOSIS — K220 Achalasia of cardia: Secondary | ICD-10-CM | POA: Diagnosis not present

## 2015-10-11 ENCOUNTER — Ambulatory Visit (INDEPENDENT_AMBULATORY_CARE_PROVIDER_SITE_OTHER): Payer: Medicare Other | Admitting: Internal Medicine

## 2015-10-15 ENCOUNTER — Other Ambulatory Visit (INDEPENDENT_AMBULATORY_CARE_PROVIDER_SITE_OTHER): Payer: Self-pay | Admitting: Internal Medicine

## 2015-10-15 ENCOUNTER — Ambulatory Visit (INDEPENDENT_AMBULATORY_CARE_PROVIDER_SITE_OTHER): Payer: Medicare Other | Admitting: Internal Medicine

## 2015-10-15 ENCOUNTER — Encounter (INDEPENDENT_AMBULATORY_CARE_PROVIDER_SITE_OTHER): Payer: Self-pay | Admitting: Internal Medicine

## 2015-10-15 VITALS — BP 140/74 | HR 66 | Temp 97.7°F | Resp 18 | Ht 70.0 in | Wt 237.9 lb

## 2015-10-15 DIAGNOSIS — K220 Achalasia of cardia: Secondary | ICD-10-CM

## 2015-10-15 MED ORDER — DILTIAZEM HCL 30 MG PO TABS
30.0000 mg | ORAL_TABLET | Freq: Three times a day (TID) | ORAL | Status: DC
Start: 2015-10-15 — End: 2015-10-15

## 2015-10-15 NOTE — Patient Instructions (Signed)
Cardiazem 30 mg by mouth 30 minutes before each meal. May also try nitroglycerin sublingual 5-10 minutes before each meal if cardiac exam does not work. Consultation with Dr. Rosendo Gros to be arranged

## 2015-10-15 NOTE — Progress Notes (Signed)
Presenting complaint;  Follow-up for dysphagia. Patient is here to discuss results of esophageal manometry.  Database:  Patient is 75 year old Caucasian male who was seen in the office in February 2017 for 6 month history of solid food dysphagia which had progressed over the last 2 months. He reported intermittent heartburn for which she was taking OTC medicines. He underwent esophagogastroduodenoscopy on 08/22/2015. Esophageal mucosa revealed linear markings but biopsy was unremarkable. He had mild changes of reflux esophagitis limited to GE junction but no evidence of stricture. He also had antral gastritis with H. pylori serology was negative. Esophagus was dilated by passing 56 [place] Maloney dilator and he was begun on pantoprazole. He reported no improvement in his dysphagia. Therefore barium pill study was performed on 09/05/2015 and revealed disruption of primary esophageal peristalsis scattered tertiary contractions and funneling to distal esophagus with distal stricture. These findings were felt to be consistent with achalasia. Esophageal manometry was performed at [facility] on 09/25/2015 revealing type II achalasia. Patient is here to discuss the results and treatment options. He is accompanied by his wife.  Subjective:  Patient continues to experience dysphagia on daily basis. If he eats slowly and chews his food thoroughly he has less difficulty. If he drinks water rapidly it comes right back. He is not having heartburn anymore. He has lost 4 pounds since his last visit of 07/18/2015. All in all he has lost about 40 pounds since his CABG 17 months ago.   Current Medications: Outpatient Encounter Prescriptions as of 10/15/2015  Medication Sig  . aspirin 81 MG tablet Take 1 tablet (81 mg total) by mouth daily.  Marland Kitchen atorvastatin (LIPITOR) 40 MG tablet Take 1 tablet (40 mg total) by mouth daily.  . colchicine 0.6 MG tablet Take 0.6 mg by mouth as needed.  . doxazosin (CARDURA) 2 MG tablet  Take 2 mg by mouth daily.  . finasteride (PROSCAR) 5 MG tablet Take 5 mg by mouth at bedtime.   . furosemide (LASIX) 40 MG tablet TAKE ONE TABLET BY MOUTH DAILY. (Patient taking differently: TAKE ONE TABLET BY MOUTH DAILY./  Mon-Thur-Satu)  . lisinopril (PRINIVIL,ZESTRIL) 5 MG tablet TAKE 1 TABLET BY MOUTH ONCE DAILY.  . metoprolol tartrate (LOPRESSOR) 25 MG tablet Take 0.5 tablets (12.5 mg total) by mouth 2 (two) times daily.  . nitroGLYCERIN (NITROSTAT) 0.4 MG SL tablet Place 1 tablet (0.4 mg total) under the tongue every 5 (five) minutes as needed for chest pain.  . pantoprazole (PROTONIX) 40 MG tablet Take 1 tablet (40 mg total) by mouth daily before breakfast.  . potassium chloride SA (K-DUR,KLOR-CON) 20 MEQ tablet TAKE ONE TABLET BY MOUTH DAILY. (Patient taking differently: No sig reported)   No facility-administered encounter medications on file as of 10/15/2015.     Objective: Blood pressure 140/74, pulse 66, temperature 97.7 F (36.5 C), temperature source Oral, resp. rate 18, height 5\' 10"  (1.778 m), weight 237 lb 14.4 oz (107.911 kg). Patient is alert and in no acute distress. Conjunctiva is pink. Sclera is nonicteric Oropharyngeal mucosa is normal. No neck masses or thyromegaly noted. Cardiac exam with regular rhythm normal S1 and S2. No murmur or gallop noted. Lungs are clear to auscultation. Abdomen;  No LE edema or clubbing noted.  Labs/studies Results:   esophageal manometry performed on or 20 10/20/2015 LES pressure 36.2 mmHg. LES relaxation 30% Residual LES pressure 19 mmHg LES sphincter length is 3.9 cm Incomplete bone and sclerae 100% 60% of swallows resulted in simultaneous peristalsis. 20% swallow was with  failed peristalsis 20% swallows with normal peristalsis.  Bolus clearance 0    Assessment:  #1. Patient has type II achalasia. Definite treatment of this disorder will be Heller's myotomy. I would not recommend endoscopic myotomy as this is a new  technique and also not available locally. I believe he is low surgical risk despite his cardiac disease.He may need cardiology clearance if he is agreeable to proceed with surgery following consultation with Dr. Rosendo Gros. In the meantime will try him on cardiac exam 30 mg by mouth before meals. He can also try sublingual nitroglycerin before each meal if he has side effects with cardiac exam and if it does not work.  Plan:  Cardiazem 30 mg by mouth before meals. Surgical consultation with Dr. Rosendo Gros of [facility] surgery or laparoscopic Heller's myotomy.

## 2015-10-22 ENCOUNTER — Encounter (INDEPENDENT_AMBULATORY_CARE_PROVIDER_SITE_OTHER): Payer: Self-pay

## 2015-10-22 DIAGNOSIS — N312 Flaccid neuropathic bladder, not elsewhere classified: Secondary | ICD-10-CM | POA: Diagnosis not present

## 2015-10-22 DIAGNOSIS — E291 Testicular hypofunction: Secondary | ICD-10-CM | POA: Diagnosis not present

## 2015-10-22 DIAGNOSIS — N401 Enlarged prostate with lower urinary tract symptoms: Secondary | ICD-10-CM | POA: Diagnosis not present

## 2015-10-22 DIAGNOSIS — N529 Male erectile dysfunction, unspecified: Secondary | ICD-10-CM | POA: Diagnosis not present

## 2015-11-08 ENCOUNTER — Ambulatory Visit: Payer: Self-pay | Admitting: General Surgery

## 2015-11-08 DIAGNOSIS — K220 Achalasia of cardia: Secondary | ICD-10-CM | POA: Diagnosis not present

## 2015-11-08 NOTE — H&P (Signed)
History of Present Illness Ralene Ok MD; 11/08/2015 10:25 AM) Patient words: achalasia.  The patient is a 75 year old male who presents with gastroesophageal reflux disease. Patient is a 75 year old male who is referred by Dr. Hildred Laser for evaluation of achalasia. Patient had a six-month history of progressive dysphagia. Patient underwent EGD, upper esophagogram which revealed dilated esophagus. Patient also has reflux. Patient a dilatation with EGD which did not improve symptoms. Patient will manometry which revealed type II achalasia. Those records are currently pending.  Patient does see Dr. Bronson Ing as his cardiologist. The patient has had a previous CABG by Dr. Roxan Hockey in 2015. Patient also had a previous CEA.   Other Problems Jeralyn Ruths, CMA; 11/08/2015 9:13 AM) Arthritis Atrial Fibrillation Back Pain Bladder Problems Gastroesophageal Reflux Disease High blood pressure Hypercholesterolemia Kidney Stone Myocardial infarction Sleep Apnea  Past Surgical History Jeralyn Ruths, CMA; 11/08/2015 9:13 AM) Carotid Artery Surgery Left. Cataract Surgery Bilateral. Colon Polyp Removal - Colonoscopy Coronary Artery Bypass Graft Hip Surgery Right. Knee Surgery Left. Oral Surgery Spinal Surgery - Lower Back Tonsillectomy  Diagnostic Studies History Jeralyn Ruths, [place]; 11/08/2015 9:13 AM) Colonoscopy 5-10 years ago  Allergies Jeralyn Ruths, CMA; 11/08/2015 9:22 AM) Codeine Phosphate *ANALGESICS - OPIOID*  Medication History Jeralyn Ruths, CMA; 11/08/2015 9:22 AM) Colchicine (0.6MG  Tablet, Oral prn) Active. Doxazosin Mesylate (2MG  Tablet, Oral daily) Active. Proscar (5MG  Tablet, Oral qhs) Active. Cardizem (30MG  Tablet, Oral daily) Active. Aspirin (81MG  Tablet, Oral daily) Active. Metoprolol-HCTZ ER (100-12.5MG  Tablet ER 24HR, Oral daily) Active. Nitroglycerin (0.4MG  Tab Sublingual, Sublingual prn) Active. Lisinopril (5MG  Tablet,  Oral daily) Active. Atorvastatin Calcium (40MG  Tablet, Oral daily) Active. Lasix (40MG  Tablet, Oral daily) Active. CloNIDine HCl (0.2MG  Tablet, Oral daily) Active. Medications Reconciled  Social History Jeralyn Ruths, [place]; 11/08/2015 9:13 AM) Alcohol use Occasional alcohol use. Caffeine use Carbonated beverages, Coffee. No drug use Tobacco use Former smoker.  Family History Jeralyn Ruths, [place]; 11/08/2015 9:13 AM) Heart Disease Brother, Father, Mother. Hypertension Mother, Son.    Review of Systems Jearld Fenton Morris CMA; 11/08/2015 9:13 AM) General Not Present- Appetite Loss, Chills, Fatigue, Fever, Night Sweats, Weight Gain and Weight Loss. Skin Not Present- Change in Wart/Mole, Dryness, Hives, Jaundice, New Lesions, Non-Healing Wounds, Rash and Ulcer. HEENT Not Present- Earache, Hearing Loss, Hoarseness, Nose Bleed, Oral Ulcers, Ringing in the Ears, Seasonal Allergies, Sinus Pain, Sore Throat, Visual Disturbances, Wears glasses/contact lenses and Yellow Eyes. Respiratory Not Present- Bloody sputum, Chronic Cough, Difficulty Breathing, Snoring and Wheezing. Breast Not Present- Breast Mass, Breast Pain, Nipple Discharge and Skin Changes. Cardiovascular Not Present- Chest Pain, Difficulty Breathing Lying Down, Leg Cramps, Palpitations, Rapid Heart Rate, Shortness of Breath and Swelling of Extremities. Gastrointestinal Present- Difficulty Swallowing and Indigestion. Not Present- Abdominal Pain, Bloating, Bloody Stool, Change in Bowel Habits, Chronic diarrhea, Constipation, Excessive gas, Gets full quickly at meals, Hemorrhoids, Nausea, Rectal Pain and Vomiting. Male Genitourinary Present- Urgency and Urine Leakage. Not Present- Blood in Urine, Change in Urinary Stream, Frequency, Impotence, Nocturia and Painful Urination.  Vitals Jearld Fenton Morris CMA; 11/08/2015 9:23 AM) 11/08/2015 9:22 AM Weight: 240.8 lb Height: 70in Body Surface Area: 2.26 m Body Mass Index: 34.55 kg/m  Temp.:  61F(Oral)  Pulse: 48 (Regular)  BP: 140/80 (Sitting, Left Arm, Standard)       Physical Exam Ralene Ok MD; 11/08/2015 10:25 AM) General Mental Status-Alert. General Appearance-Consistent with stated age. Hydration-Well hydrated. Voice-Normal.  Head and Neck Head-normocephalic, atraumatic with no lesions or palpable masses. Trachea-midline. Thyroid Gland Characteristics - normal size and consistency.  Eye Eyeball - Bilateral-Extraocular movements intact. Sclera/Conjunctiva - Bilateral-No scleral icterus.  Chest and Lung Exam Chest and lung exam reveals -quiet, even and easy respiratory effort with no use of accessory muscles and on auscultation, normal breath sounds, no adventitious sounds and normal vocal resonance. Inspection Chest Wall - Normal. Back - normal.  Breast Breast - Left-Symmetric, Non Tender, No Biopsy scars, no Dimpling, No Inflammation, No Lumpectomy scars, No Mastectomy scars, No Peau d' Orange. Breast - Right-Symmetric, Non Tender, No Biopsy scars, no Dimpling, No Inflammation, No Lumpectomy scars, No Mastectomy scars, No Peau d' Orange. Breast Lump-No Palpable Breast Mass.  Cardiovascular Cardiovascular examination reveals -normal heart sounds, regular rate and rhythm with no murmurs and normal pedal pulses bilaterally.  Abdomen Inspection Inspection of the abdomen reveals - No Hernias. Skin - Scar - no surgical scars. Palpation/Percussion Palpation and Percussion of the abdomen reveal - Soft, Non Tender, No Rebound tenderness, No Rigidity (guarding) and No hepatosplenomegaly. Auscultation Auscultation of the abdomen reveals - Bowel sounds normal.  Neurologic Neurologic evaluation reveals -alert and oriented x 3 with no impairment of recent or remote memory. Mental Status-Normal.  Musculoskeletal Normal Exam - Left-Upper Extremity Strength Normal and Lower Extremity Strength Normal. Normal Exam -  Right-Upper Extremity Strength Normal and Lower Extremity Strength Normal.  Lymphatic Head & Neck  General Head & Neck Lymphatics: Bilateral - Description - Normal. Axillary  General Axillary Region: Bilateral - Description - Normal. Tenderness - Non Tender. Femoral & Inguinal  Generalized Femoral & Inguinal Lymphatics: Bilateral - Description - Normal. Tenderness - Non Tender.    Assessment & Plan Ralene Ok MD; 11/08/2015 10:26 AM) ACHALASIA (K22.0) Impression: Patient is a 75 year old male with type II achalasia. 1.  We will proceed with robotic esophagomyotomy, and esophageal wrap. 2. I did discuss with the patient is wife the risks and benefits of the procedure to include but not limited to: Infection, bleeding, damage to surrounding structures, possible need for chest tube placement, possible esophageal leak. The patient was understanding and wishes to proceed.

## 2015-11-13 ENCOUNTER — Encounter: Payer: Self-pay | Admitting: Cardiology

## 2015-11-16 DIAGNOSIS — Z79899 Other long term (current) drug therapy: Secondary | ICD-10-CM | POA: Diagnosis not present

## 2015-11-16 DIAGNOSIS — I779 Disorder of arteries and arterioles, unspecified: Secondary | ICD-10-CM | POA: Diagnosis not present

## 2015-11-16 DIAGNOSIS — Z125 Encounter for screening for malignant neoplasm of prostate: Secondary | ICD-10-CM | POA: Diagnosis not present

## 2015-11-16 DIAGNOSIS — E6609 Other obesity due to excess calories: Secondary | ICD-10-CM | POA: Diagnosis not present

## 2015-11-16 DIAGNOSIS — M109 Gout, unspecified: Secondary | ICD-10-CM | POA: Diagnosis not present

## 2015-11-16 DIAGNOSIS — M1990 Unspecified osteoarthritis, unspecified site: Secondary | ICD-10-CM | POA: Diagnosis not present

## 2015-11-16 DIAGNOSIS — I10 Essential (primary) hypertension: Secondary | ICD-10-CM | POA: Diagnosis not present

## 2015-11-29 DIAGNOSIS — I2510 Atherosclerotic heart disease of native coronary artery without angina pectoris: Secondary | ICD-10-CM | POA: Diagnosis not present

## 2015-11-29 DIAGNOSIS — E785 Hyperlipidemia, unspecified: Secondary | ICD-10-CM | POA: Diagnosis not present

## 2015-11-29 DIAGNOSIS — Z6835 Body mass index (BMI) 35.0-35.9, adult: Secondary | ICD-10-CM | POA: Diagnosis not present

## 2015-11-29 DIAGNOSIS — I10 Essential (primary) hypertension: Secondary | ICD-10-CM | POA: Diagnosis not present

## 2015-12-12 ENCOUNTER — Ambulatory Visit (HOSPITAL_COMMUNITY)
Admission: RE | Admit: 2015-12-12 | Discharge: 2015-12-12 | Disposition: A | Discharge status: Home / Self Care | Payer: Medicare Other | Source: Ambulatory Visit | Attending: Anesthesiology | Admitting: Anesthesiology

## 2015-12-12 ENCOUNTER — Telehealth: Payer: Self-pay | Admitting: Internal Medicine

## 2015-12-12 ENCOUNTER — Encounter (HOSPITAL_COMMUNITY)
Admission: RE | Admit: 2015-12-12 | Discharge: 2015-12-12 | Disposition: A | Discharge status: Home / Self Care | Payer: Medicare Other | Source: Ambulatory Visit | Attending: General Surgery | Admitting: General Surgery

## 2015-12-12 ENCOUNTER — Encounter (HOSPITAL_COMMUNITY): Payer: Self-pay

## 2015-12-12 DIAGNOSIS — J90 Pleural effusion, not elsewhere classified: Secondary | ICD-10-CM | POA: Diagnosis not present

## 2015-12-12 DIAGNOSIS — R9389 Abnormal findings on diagnostic imaging of other specified body structures: Secondary | ICD-10-CM

## 2015-12-12 DIAGNOSIS — Z01818 Encounter for other preprocedural examination: Secondary | ICD-10-CM | POA: Diagnosis not present

## 2015-12-12 DIAGNOSIS — R938 Abnormal findings on diagnostic imaging of other specified body structures: Secondary | ICD-10-CM | POA: Diagnosis not present

## 2015-12-12 HISTORY — DX: Basal cell carcinoma of skin, unspecified: C44.91

## 2015-12-12 LAB — CBC
HCT: 40.9 % (ref 39.0–52.0)
HEMOGLOBIN: 13.1 g/dL (ref 13.0–17.0)
MCH: 29.5 pg (ref 26.0–34.0)
MCHC: 32 g/dL (ref 30.0–36.0)
MCV: 92.1 fL (ref 78.0–100.0)
PLATELETS: 168 10*3/uL (ref 150–400)
RBC: 4.44 MIL/uL (ref 4.22–5.81)
RDW: 13.8 % (ref 11.5–15.5)
WBC: 5.8 10*3/uL (ref 4.0–10.5)

## 2015-12-12 LAB — BASIC METABOLIC PANEL
ANION GAP: 6 (ref 5–15)
BUN: 17 mg/dL (ref 6–20)
CALCIUM: 9.1 mg/dL (ref 8.9–10.3)
CO2: 28 mmol/L (ref 22–32)
CREATININE: 1.06 mg/dL (ref 0.61–1.24)
Chloride: 106 mmol/L (ref 101–111)
Glucose, Bld: 101 mg/dL — ABNORMAL HIGH (ref 65–99)
Potassium: 4.5 mmol/L (ref 3.5–5.1)
SODIUM: 140 mmol/L (ref 135–145)

## 2015-12-12 LAB — SURGICAL PCR SCREEN
MRSA, PCR: NEGATIVE
Staphylococcus aureus: POSITIVE — AB

## 2015-12-12 NOTE — Telephone Encounter (Signed)
Called and spoke with pt and he stated that he was seen by CY back in December and thought that his CPAP was to be increased from 10 to 11.  He stated that he has never taken in his machine and does not think that this has been done.  Wanted to check on this.  He stated that he has not been having any problems.  Message sent to [facility] at [facility] to check on the status of this and pt is aware that I will call him back.

## 2015-12-12 NOTE — Patient Instructions (Addendum)
Caleb Wolf  12/12/2015   Your procedure is scheduled on: Tuesday December 18, 2015  Report to [facility] Main  Entrance take Platinum  elevators to 3rd floor to  [place] at 7:30 AM.  Call this number if you have problems the morning of surgery [phone]   Remember: ONLY 1 PERSON MAY GO WITH YOU TO SHORT STAY TO GET  READY MORNING OF Caleb Wolf.  Do not eat food or drink liquids :After Midnight.     Take these medicines the morning of surgery with A SIP OF WATER: Doxazosin (Cardura); Metoprolol                                 You may not have any metal on your body including hair pins and              piercings  Do not wear jewelry,  lotions, powders or colognes, deodorant                          Men may shave face and neck.   Do not bring valuables to the hospital. [place].  Contacts, dentures or bridgework may not be worn into surgery.  Leave suitcase in the car. After surgery it may be brought to your room.              BRING CPAP MASK AND TUBING DAY OF SURGERY                _____________________________________________________________________             [facility] - Preparing for Surgery Before surgery, you can play an important role.  Because skin is not sterile, your skin needs to be as free of germs as possible.  You can reduce the number of germs on your skin by washing with CHG (chlorahexidine gluconate) soap before surgery.  CHG is an antiseptic cleaner which kills germs and bonds with the skin to continue killing germs even after washing. Please DO NOT use if you have an allergy to CHG or antibacterial soaps.  If your skin becomes reddened/irritated stop using the CHG and inform your nurse when you arrive at Short Stay. Do not shave (including legs and underarms) for at least 48 hours prior to the first CHG shower.  You may shave your face/neck. Please follow these instructions  carefully:  1.  Shower with CHG Soap the night before surgery and the  morning of Surgery.  2.  If you choose to wash your hair, wash your hair first as usual with your  normal  shampoo.  3.  After you shampoo, rinse your hair and body thoroughly to remove the  shampoo.                           4.  Use CHG as you would any other liquid soap.  You can apply chg directly  to the skin and wash                       Gently with a scrungie or clean washcloth.  5.  Apply the CHG Soap  to your body ONLY FROM THE NECK DOWN.   Do not use on face/ open                           Wound or open sores. Avoid contact with eyes, ears mouth and genitals (private parts).                       Wash face,  Genitals (private parts) with your normal soap.             6.  Wash thoroughly, paying special attention to the area where your surgery  will be performed.  7.  Thoroughly rinse your body with warm water from the neck down.  8.  DO NOT shower/wash with your normal soap after using and rinsing off  the CHG Soap.                9.  Pat yourself dry with a clean towel.            10.  Wear clean pajamas.            11.  Place clean sheets on your bed the night of your first shower and do not  sleep with pets. Day of Surgery : Do not apply any lotions/deodorants the morning of surgery.  Please wear clean clothes to the hospital/surgery center.  FAILURE TO FOLLOW THESE INSTRUCTIONS MAY RESULT IN THE CANCELLATION OF YOUR SURGERY PATIENT SIGNATURE_________________________________  NURSE SIGNATURE__________________________________  ________________________________________________________________________

## 2015-12-12 NOTE — Progress Notes (Signed)
Cardiac clearance per chart per Dr Bronson Ing 11/09/2015

## 2015-12-12 NOTE — Progress Notes (Addendum)
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 Results sent to Dr Rosendo Gros. Prescription for Mupirocin Ointment called to [facility]; spoke with Scott/pharmacist. Pt aware.

## 2015-12-12 NOTE — Progress Notes (Signed)
CXR results per PAT visit 12/12/2015 per epic sent to Dr Rosendo Gros

## 2015-12-13 NOTE — Telephone Encounter (Signed)
Willowbrook Adkins, CMA           The last order that we have on file for him is for a pressure of 10. If his pressure needs to be changed, we'll just need an order.  Thanks and have a great day!  Melissa

## 2015-12-13 NOTE — Telephone Encounter (Signed)
I have checked Leigh's box. There is not a response from [facility] yet. Will await Melissa's response.

## 2015-12-13 NOTE — Telephone Encounter (Signed)
An order for this pressure change was sent to [facility] on 05/03/15- states this order was received and confirmed by Melissa. lmtcb X1 for Melissa to make aware, and see if we do in fact need to place a duplicate order.

## 2015-12-17 NOTE — Telephone Encounter (Signed)
Caleb Wolf  Randa Spike, CMA           We are good. The order was process as a priority on 7/14. Thanks!     Spoke with pt's wife. States that pt is currently at [facility] having his CPAP pressure changed. Nothing further was needed.

## 2015-12-17 NOTE — Telephone Encounter (Signed)
I have sent a staff message to Caleb Wolf asking if we need to place another order. Will await her response.

## 2015-12-18 ENCOUNTER — Inpatient Hospital Stay (HOSPITAL_COMMUNITY): Payer: Medicare Other | Admitting: Certified Registered Nurse Anesthetist

## 2015-12-18 ENCOUNTER — Encounter (HOSPITAL_COMMUNITY)
Admission: RE | Disposition: A | Discharge status: Home / Self Care | Payer: Self-pay | Source: Ambulatory Visit | Attending: General Surgery

## 2015-12-18 ENCOUNTER — Encounter (HOSPITAL_COMMUNITY): Payer: Self-pay | Admitting: *Deleted

## 2015-12-18 ENCOUNTER — Inpatient Hospital Stay (HOSPITAL_COMMUNITY)
Admission: RE | Admit: 2015-12-18 | Discharge: 2015-12-20 | DRG: 328 | Disposition: A | Discharge status: Home / Self Care | Payer: Medicare Other | Source: Ambulatory Visit | Attending: General Surgery | Admitting: General Surgery

## 2015-12-18 DIAGNOSIS — Z79899 Other long term (current) drug therapy: Secondary | ICD-10-CM

## 2015-12-18 DIAGNOSIS — K220 Achalasia of cardia: Principal | ICD-10-CM | POA: Diagnosis present

## 2015-12-18 DIAGNOSIS — I10 Essential (primary) hypertension: Secondary | ICD-10-CM | POA: Diagnosis present

## 2015-12-18 DIAGNOSIS — Z9842 Cataract extraction status, left eye: Secondary | ICD-10-CM | POA: Diagnosis not present

## 2015-12-18 DIAGNOSIS — Z6833 Body mass index (BMI) 33.0-33.9, adult: Secondary | ICD-10-CM | POA: Diagnosis not present

## 2015-12-18 DIAGNOSIS — Z87891 Personal history of nicotine dependence: Secondary | ICD-10-CM

## 2015-12-18 DIAGNOSIS — Z9841 Cataract extraction status, right eye: Secondary | ICD-10-CM

## 2015-12-18 DIAGNOSIS — Z7982 Long term (current) use of aspirin: Secondary | ICD-10-CM | POA: Diagnosis not present

## 2015-12-18 DIAGNOSIS — M1990 Unspecified osteoarthritis, unspecified site: Secondary | ICD-10-CM | POA: Diagnosis present

## 2015-12-18 DIAGNOSIS — E7800 Pure hypercholesterolemia, unspecified: Secondary | ICD-10-CM | POA: Diagnosis not present

## 2015-12-18 DIAGNOSIS — E669 Obesity, unspecified: Secondary | ICD-10-CM | POA: Diagnosis present

## 2015-12-18 DIAGNOSIS — R1310 Dysphagia, unspecified: Secondary | ICD-10-CM | POA: Diagnosis not present

## 2015-12-18 DIAGNOSIS — K219 Gastro-esophageal reflux disease without esophagitis: Secondary | ICD-10-CM | POA: Diagnosis present

## 2015-12-18 DIAGNOSIS — Z9889 Other specified postprocedural states: Secondary | ICD-10-CM

## 2015-12-18 DIAGNOSIS — I4891 Unspecified atrial fibrillation: Secondary | ICD-10-CM | POA: Diagnosis present

## 2015-12-18 DIAGNOSIS — I252 Old myocardial infarction: Secondary | ICD-10-CM

## 2015-12-18 DIAGNOSIS — I2510 Atherosclerotic heart disease of native coronary artery without angina pectoris: Secondary | ICD-10-CM | POA: Diagnosis present

## 2015-12-18 DIAGNOSIS — G4730 Sleep apnea, unspecified: Secondary | ICD-10-CM | POA: Diagnosis present

## 2015-12-18 DIAGNOSIS — Z951 Presence of aortocoronary bypass graft: Secondary | ICD-10-CM

## 2015-12-18 HISTORY — PX: UPPER GI ENDOSCOPY: SHX6162

## 2015-12-18 LAB — TYPE AND SCREEN
ABO/RH(D): A POS
ANTIBODY SCREEN: NEGATIVE

## 2015-12-18 SURGERY — FUNDOPLICATION, NISSEN, ROBOT-ASSISTED, LAPAROSCOPIC
Anesthesia: General | Site: Abdomen

## 2015-12-18 MED ORDER — HYDROMORPHONE HCL 1 MG/ML IJ SOLN
0.2500 mg | INTRAMUSCULAR | Status: DC | PRN
  Administered 2015-12-18: 0.25 mg via INTRAVENOUS
  Administered 2015-12-18 (×2): 0.5 mg via INTRAVENOUS
  Administered 2015-12-18 (×3): 0.25 mg via INTRAVENOUS

## 2015-12-18 MED ORDER — LIDOCAINE HCL (CARDIAC) 20 MG/ML IV SOLN
INTRAVENOUS | Status: DC | PRN
  Administered 2015-12-18: 60 mg via INTRAVENOUS

## 2015-12-18 MED ORDER — LIDOCAINE HCL (CARDIAC) 20 MG/ML IV SOLN
INTRAVENOUS | Status: AC
  Filled 2015-12-18: qty 5

## 2015-12-18 MED ORDER — DEXAMETHASONE SODIUM PHOSPHATE 4 MG/ML IJ SOLN
INTRAMUSCULAR | Status: DC | PRN
  Administered 2015-12-18: 10 mg via INTRAVENOUS

## 2015-12-18 MED ORDER — PROMETHAZINE HCL 25 MG/ML IJ SOLN
INTRAMUSCULAR | Status: AC
  Filled 2015-12-18: qty 1

## 2015-12-18 MED ORDER — HYDROMORPHONE HCL 1 MG/ML IJ SOLN
INTRAMUSCULAR | Status: AC
  Filled 2015-12-18: qty 1

## 2015-12-18 MED ORDER — HYDRALAZINE HCL 20 MG/ML IJ SOLN: 10.0000 mg | INTRAMUSCULAR | Status: DC | PRN

## 2015-12-18 MED ORDER — CHLORHEXIDINE GLUCONATE CLOTH 2 % EX PADS: 6.0000 | MEDICATED_PAD | Freq: Once | CUTANEOUS | Status: DC

## 2015-12-18 MED ORDER — MIDAZOLAM HCL 5 MG/5ML IJ SOLN
INTRAMUSCULAR | Status: DC | PRN
  Administered 2015-12-18: 2 mg via INTRAVENOUS

## 2015-12-18 MED ORDER — GLYCOPYRROLATE 0.2 MG/ML IJ SOLN
INTRAMUSCULAR | Status: DC | PRN
  Administered 2015-12-18: 0.2 mg via INTRAVENOUS

## 2015-12-18 MED ORDER — PROMETHAZINE HCL 25 MG/ML IJ SOLN
6.2500 mg | INTRAMUSCULAR | Status: DC | PRN
  Administered 2015-12-18: 6.25 mg via INTRAVENOUS

## 2015-12-18 MED ORDER — CEFAZOLIN SODIUM-DEXTROSE 2-4 GM/100ML-% IV SOLN
INTRAVENOUS | Status: AC
  Filled 2015-12-18: qty 100

## 2015-12-18 MED ORDER — ONDANSETRON HCL 4 MG/2ML IJ SOLN
4.0000 mg | Freq: Four times a day (QID) | INTRAMUSCULAR | Status: DC | PRN
  Administered 2015-12-18 – 2015-12-19 (×2): 4 mg via INTRAVENOUS
  Filled 2015-12-18 (×2): qty 2

## 2015-12-18 MED ORDER — NITROGLYCERIN 0.4 MG SL SUBL: 0.4000 mg | SUBLINGUAL_TABLET | SUBLINGUAL | Status: DC | PRN

## 2015-12-18 MED ORDER — SUGAMMADEX SODIUM 200 MG/2ML IV SOLN
INTRAVENOUS | Status: DC | PRN
  Administered 2015-12-18: 200 mg via INTRAVENOUS

## 2015-12-18 MED ORDER — SUGAMMADEX SODIUM 200 MG/2ML IV SOLN
INTRAVENOUS | Status: AC
  Filled 2015-12-18: qty 2

## 2015-12-18 MED ORDER — GLYCOPYRROLATE 0.2 MG/ML IJ SOLN
INTRAMUSCULAR | Status: AC
  Filled 2015-12-18: qty 1

## 2015-12-18 MED ORDER — LACTATED RINGERS IR SOLN
Status: DC | PRN
  Administered 2015-12-18: 1000 mL

## 2015-12-18 MED ORDER — FENTANYL CITRATE (PF) 100 MCG/2ML IJ SOLN
INTRAMUSCULAR | Status: DC | PRN
  Administered 2015-12-18: 100 ug via INTRAVENOUS
  Administered 2015-12-18 (×2): 50 ug via INTRAVENOUS

## 2015-12-18 MED ORDER — ENOXAPARIN SODIUM 40 MG/0.4ML ~~LOC~~ SOLN
40.0000 mg | SUBCUTANEOUS | Status: DC
  Administered 2015-12-18 – 2015-12-19 (×2): 40 mg via SUBCUTANEOUS
  Filled 2015-12-18 (×2): qty 0.4

## 2015-12-18 MED ORDER — PROPOFOL 10 MG/ML IV BOLUS
INTRAVENOUS | Status: DC | PRN
  Administered 2015-12-18: 140 mg via INTRAVENOUS

## 2015-12-18 MED ORDER — LACTATED RINGERS IV SOLN
INTRAVENOUS | Status: DC
  Administered 2015-12-18 (×2): via INTRAVENOUS

## 2015-12-18 MED ORDER — CEFAZOLIN SODIUM-DEXTROSE 2-4 GM/100ML-% IV SOLN
2.0000 g | INTRAVENOUS | Status: AC
  Administered 2015-12-18: 2 g via INTRAVENOUS

## 2015-12-18 MED ORDER — DEXTROSE-NACL 5-0.9 % IV SOLN
INTRAVENOUS | Status: DC
  Administered 2015-12-18 – 2015-12-19 (×3): via INTRAVENOUS
  Administered 2015-12-20: 75 mL/h via INTRAVENOUS

## 2015-12-18 MED ORDER — ROCURONIUM BROMIDE 100 MG/10ML IV SOLN
INTRAVENOUS | Status: AC
  Filled 2015-12-18: qty 1

## 2015-12-18 MED ORDER — LACTATED RINGERS IV SOLN
INTRAVENOUS | Status: DC | PRN
  Administered 2015-12-18: 09:00:00 via INTRAVENOUS

## 2015-12-18 MED ORDER — HYDROMORPHONE HCL 1 MG/ML IJ SOLN
1.0000 mg | INTRAMUSCULAR | Status: DC | PRN
  Administered 2015-12-18: 1 mg via INTRAVENOUS
  Filled 2015-12-18: qty 1

## 2015-12-18 MED ORDER — 0.9 % SODIUM CHLORIDE (POUR BTL) OPTIME
TOPICAL | Status: DC | PRN
  Administered 2015-12-18: 1000 mL

## 2015-12-18 MED ORDER — ONDANSETRON HCL 4 MG/2ML IJ SOLN
INTRAMUSCULAR | Status: AC
  Filled 2015-12-18: qty 2

## 2015-12-18 MED ORDER — PROPOFOL 10 MG/ML IV BOLUS
INTRAVENOUS | Status: AC
  Filled 2015-12-18: qty 20

## 2015-12-18 MED ORDER — BUPIVACAINE-EPINEPHRINE 0.25% -1:200000 IJ SOLN
INTRAMUSCULAR | Status: AC
  Filled 2015-12-18: qty 1

## 2015-12-18 MED ORDER — CHLORHEXIDINE GLUCONATE 0.12 % MT SOLN
15.0000 mL | Freq: Two times a day (BID) | OROMUCOSAL | Status: DC
  Administered 2015-12-18 – 2015-12-20 (×4): 15 mL via OROMUCOSAL
  Filled 2015-12-18 (×4): qty 15

## 2015-12-18 MED ORDER — ONDANSETRON HCL 4 MG/2ML IJ SOLN
INTRAMUSCULAR | Status: DC | PRN
  Administered 2015-12-18: 4 mg via INTRAVENOUS

## 2015-12-18 MED ORDER — FENTANYL CITRATE (PF) 250 MCG/5ML IJ SOLN
INTRAMUSCULAR | Status: AC
  Filled 2015-12-18: qty 5

## 2015-12-18 MED ORDER — ROCURONIUM BROMIDE 100 MG/10ML IV SOLN
INTRAVENOUS | Status: DC | PRN
  Administered 2015-12-18: 10 mg via INTRAVENOUS
  Administered 2015-12-18: 5 mg via INTRAVENOUS
  Administered 2015-12-18: 50 mg via INTRAVENOUS
  Administered 2015-12-18: 20 mg via INTRAVENOUS

## 2015-12-18 MED ORDER — ONDANSETRON 4 MG PO TBDP: 4.0000 mg | ORAL_TABLET | Freq: Four times a day (QID) | ORAL | Status: DC | PRN

## 2015-12-18 MED ORDER — MIDAZOLAM HCL 2 MG/2ML IJ SOLN
INTRAMUSCULAR | Status: AC
  Filled 2015-12-18: qty 2

## 2015-12-18 MED ORDER — SUCCINYLCHOLINE CHLORIDE 20 MG/ML IJ SOLN
INTRAMUSCULAR | Status: DC | PRN
  Administered 2015-12-18: 100 mg via INTRAVENOUS

## 2015-12-18 MED ORDER — DEXAMETHASONE SODIUM PHOSPHATE 10 MG/ML IJ SOLN
INTRAMUSCULAR | Status: AC
  Filled 2015-12-18: qty 1

## 2015-12-18 MED ORDER — BUPIVACAINE-EPINEPHRINE 0.25% -1:200000 IJ SOLN
INTRAMUSCULAR | Status: DC | PRN
  Administered 2015-12-18: 34 mL

## 2015-12-18 SURGICAL SUPPLY — 65 items
APPLIER CLIP 5 13 M/L LIGAMAX5 (MISCELLANEOUS)
APPLIER CLIP ROT 10 11.4 M/L (STAPLE)
BENZOIN TINCTURE PRP APPL 2/3 (GAUZE/BANDAGES/DRESSINGS) IMPLANT
BLADE SURG SZ11 CARB STEEL (BLADE) ×4 IMPLANT
CHLORAPREP W/TINT 26ML (MISCELLANEOUS) ×4 IMPLANT
CLIP APPLIE 5 13 M/L LIGAMAX5 (MISCELLANEOUS) IMPLANT
CLIP APPLIE ROT 10 11.4 M/L (STAPLE) IMPLANT
CLIP LIGATING HEM O LOK PURPLE (MISCELLANEOUS) IMPLANT
CLIP LIGATING HEMO O LOK GREEN (MISCELLANEOUS) IMPLANT
CLOSURE WOUND 1/2 X4 (GAUZE/BANDAGES/DRESSINGS)
COVER TIP SHEARS 8 DVNC (MISCELLANEOUS) ×2 IMPLANT
COVER TIP SHEARS 8MM DA VINCI (MISCELLANEOUS) ×2
DECANTER SPIKE VIAL GLASS SM (MISCELLANEOUS) IMPLANT
DEVICE TROCAR PUNCTURE CLOSURE (ENDOMECHANICALS) IMPLANT
DRAIN PENROSE 18X1/2 LTX STRL (DRAIN) ×4 IMPLANT
DRAPE ARM DVNC X/XI (DISPOSABLE) ×8 IMPLANT
DRAPE COLUMN DVNC XI (DISPOSABLE) ×2 IMPLANT
DRAPE DA VINCI XI ARM (DISPOSABLE) ×8
DRAPE DA VINCI XI COLUMN (DISPOSABLE) ×2
DRAPE LG THREE QUARTER DISP (DRAPES) IMPLANT
ELECT REM PT RETURN 9FT ADLT (ELECTROSURGICAL) ×4
ELECTRODE REM PT RTRN 9FT ADLT (ELECTROSURGICAL) ×2 IMPLANT
ENDOLOOP SUT PDS II  0 18 (SUTURE)
ENDOLOOP SUT PDS II 0 18 (SUTURE) IMPLANT
GAUZE SPONGE 2X2 8PLY STRL LF (GAUZE/BANDAGES/DRESSINGS) IMPLANT
GAUZE SPONGE 4X4 16PLY XRAY LF (GAUZE/BANDAGES/DRESSINGS) IMPLANT
GLOVE BIO SURGEON STRL SZ7.5 (GLOVE) ×8 IMPLANT
GOWN STRL REUS W/TWL XL LVL3 (GOWN DISPOSABLE) ×24 IMPLANT
KIT BASIN OR (CUSTOM PROCEDURE TRAY) ×4 IMPLANT
LIQUID BAND (GAUZE/BANDAGES/DRESSINGS) ×4 IMPLANT
MARKER SKIN DUAL TIP RULER LAB (MISCELLANEOUS) ×4 IMPLANT
NEEDLE HYPO 22GX1.5 SAFETY (NEEDLE) ×4 IMPLANT
NEEDLE INSUFFLATION 14GA 120MM (NEEDLE) ×4 IMPLANT
PACK CARDIOVASCULAR III (CUSTOM PROCEDURE TRAY) ×4 IMPLANT
PAD POSITIONING PINK XL (MISCELLANEOUS) ×4 IMPLANT
SCISSORS LAP 5X35 DISP (ENDOMECHANICALS) ×4 IMPLANT
SEAL CANN UNIV 5-8 DVNC XI (MISCELLANEOUS) ×8 IMPLANT
SEAL XI 5MM-8MM UNIVERSAL (MISCELLANEOUS) ×8
SEALER VESSEL DA VINCI XI (MISCELLANEOUS) ×2
SEALER VESSEL EXT DVNC XI (MISCELLANEOUS) ×2 IMPLANT
SET BI-LUMEN FLTR TB AIRSEAL (TUBING) ×4 IMPLANT
SET TUBE IRRIG SUCTION NO TIP (IRRIGATION / IRRIGATOR) ×4 IMPLANT
SLEEVE XCEL OPT CAN 5 100 (ENDOMECHANICALS) IMPLANT
SOLUTION ANTI FOG 6CC (MISCELLANEOUS) ×4 IMPLANT
SOLUTION ELECTROLUBE (MISCELLANEOUS) ×4 IMPLANT
SPONGE GAUZE 2X2 STER 10/PKG (GAUZE/BANDAGES/DRESSINGS)
SPONGE LAP 18X18 X RAY DECT (DISPOSABLE) ×4 IMPLANT
STAPLER VISISTAT 35W (STAPLE) IMPLANT
STRIP CLOSURE SKIN 1/2X4 (GAUZE/BANDAGES/DRESSINGS) IMPLANT
SUT ETHIBOND 0 36 GRN (SUTURE) IMPLANT
SUT MNCRL AB 4-0 PS2 18 (SUTURE) ×4 IMPLANT
SUT SILK 0 SH 30 (SUTURE) IMPLANT
SUT SILK 2 0 SH (SUTURE) IMPLANT
SYR 20CC LL (SYRINGE) ×4 IMPLANT
SYRINGE 10CC LL (SYRINGE) ×4 IMPLANT
SYRINGE 60CC LL (MISCELLANEOUS) ×4 IMPLANT
TOWEL OR 17X26 10 PK STRL BLUE (TOWEL DISPOSABLE) ×4 IMPLANT
TOWEL OR NON WOVEN STRL DISP B (DISPOSABLE) ×4 IMPLANT
TRAY FOLEY W/METER SILVER 14FR (SET/KITS/TRAYS/PACK) IMPLANT
TRAY FOLEY W/METER SILVER 16FR (SET/KITS/TRAYS/PACK) ×4 IMPLANT
TROCAR ADV FIXATION 5X100MM (TROCAR) ×4 IMPLANT
TROCAR BLADELESS OPT 5 100 (ENDOMECHANICALS) ×4 IMPLANT
TUBING CONNECTING 10 (TUBING) ×3 IMPLANT
TUBING CONNECTING 10' (TUBING) ×1
TUBING ENDO SMARTCAP PENTAX (MISCELLANEOUS) ×4 IMPLANT

## 2015-12-18 NOTE — H&P (Signed)
History of Present Illness Patient words: achalasia.  The patient is a 75 year old male who presents with gastroesophageal reflux disease. Patient is a 75 year old male who is referred by Dr. Hildred Laser for evaluation of achalasia. Patient had a six-month history of progressive dysphagia. Patient underwent EGD, upper esophagogram which revealed dilated esophagus. Patient also has reflux. Patient a dilatation with EGD which did not improve symptoms. Patient will manometry which revealed type II achalasia. Those records are currently pending.  Patient does see Dr. Bronson Ing as his cardiologist. The patient has had a previous CABG by Dr. Roxan Hockey in 2015. Patient also had a previous CEA.   Other Problems  Arthritis Atrial Fibrillation Back Pain Bladder Problems Gastroesophageal Reflux Disease High blood pressure Hypercholesterolemia Kidney Stone Myocardial infarction Sleep Apnea  Past Surgical History Carotid Artery Surgery Left. Cataract Surgery Bilateral. Colon Polyp Removal - Colonoscopy Coronary Artery Bypass Graft Hip Surgery Right. Knee Surgery Left. Oral Surgery Spinal Surgery - Lower Back Tonsillectomy  Diagnostic Studies History Colonoscopy 5-10 years ago  Allergies  Codeine Phosphate *ANALGESICS - OPIOID*  Medication History  Colchicine (0.6MG  Tablet, Oral prn) Active. Doxazosin Mesylate (2MG  Tablet, Oral daily) Active. Proscar (5MG  Tablet, Oral qhs) Active. Cardizem (30MG  Tablet, Oral daily) Active. Aspirin (81MG  Tablet, Oral daily) Active. Metoprolol-HCTZ ER (100-12.5MG  Tablet ER 24HR, Oral daily) Active. Nitroglycerin (0.4MG  Tab Sublingual, Sublingual prn) Active. Lisinopril (5MG  Tablet, Oral daily) Active. Atorvastatin Calcium (40MG  Tablet, Oral daily) Active. Lasix (40MG  Tablet, Oral daily) Active. CloNIDine HCl (0.2MG  Tablet, Oral daily) Active. Medications Reconciled  Social History  Alcohol use  Occasional alcohol use. Caffeine use Carbonated beverages, Coffee. No drug use Tobacco use Former smoker.  Family History  Heart Disease Brother, Father, Mother. Hypertension Mother, Son.    Review of Systems  General Not Present- Appetite Loss, Chills, Fatigue, Fever, Night Sweats, Weight Gain and Weight Loss. Skin Not Present- Change in Wart/Mole, Dryness, Hives, Jaundice, New Lesions, Non-Healing Wounds, Rash and Ulcer. HEENT Not Present- Earache, Hearing Loss, Hoarseness, Nose Bleed, Oral Ulcers, Ringing in the Ears, Seasonal Allergies, Sinus Pain, Sore Throat, Visual Disturbances, Wears glasses/contact lenses and Yellow Eyes. Respiratory Not Present- Bloody sputum, Chronic Cough, Difficulty Breathing, Snoring and Wheezing. Breast Not Present- Breast Mass, Breast Pain, Nipple Discharge and Skin Changes. Cardiovascular Not Present- Chest Pain, Difficulty Breathing Lying Down, Leg Cramps, Palpitations, Rapid Heart Rate, Shortness of Breath and Swelling of Extremities. Gastrointestinal Present- Difficulty Swallowing and Indigestion. Not Present- Abdominal Pain, Bloating, Bloody Stool, Change in Bowel Habits, Chronic diarrhea, Constipation, Excessive gas, Gets full quickly at meals, Hemorrhoids, Nausea, Rectal Pain and Vomiting. Male Genitourinary Present- Urgency and Urine Leakage. Not Present- Blood in Urine, Change in Urinary Stream, Frequency, Impotence, Nocturia and Painful Urination.  BP 152/60 mmHg  Pulse 51  Temp(Src) 98.9 F (37.2 C) (Oral)  Resp 18  Ht 5\' 10"  (1.778 m)  Wt 106.777 kg (235 lb 6.4 oz)  BMI 33.78 kg/m2  SpO2 99%   Physical Exam  General Mental Status-Alert. General Appearance-Consistent with stated age. Hydration-Well hydrated. Voice-Normal.  Head and Neck Head-normocephalic, atraumatic with no lesions or palpable masses. Trachea-midline. Thyroid Gland Characteristics - normal size and consistency.  Eye Eyeball -  Bilateral-Extraocular movements intact. Sclera/Conjunctiva - Bilateral-No scleral icterus.  Chest and Lung Exam Chest and lung exam reveals -quiet, even and easy respiratory effort with no use of accessory muscles and on auscultation, normal breath sounds, no adventitious sounds and normal vocal resonance. Inspection Chest Wall - Normal. Back - normal.  Breast Breast -  Left-Symmetric, Non Tender, No Biopsy scars, no Dimpling, No Inflammation, No Lumpectomy scars, No Mastectomy scars, No Peau d' Orange. Breast - Right-Symmetric, Non Tender, No Biopsy scars, no Dimpling, No Inflammation, No Lumpectomy scars, No Mastectomy scars, No Peau d' Orange. Breast Lump-No Palpable Breast Mass.  Cardiovascular Cardiovascular examination reveals -normal heart sounds, regular rate and rhythm with no murmurs and normal pedal pulses bilaterally.  Abdomen Inspection Inspection of the abdomen reveals - No Hernias. Skin - Scar - no surgical scars. Palpation/Percussion Palpation and Percussion of the abdomen reveal - Soft, Non Tender, No Rebound tenderness, No Rigidity (guarding) and No hepatosplenomegaly. Auscultation Auscultation of the abdomen reveals - Bowel sounds normal.  Neurologic Neurologic evaluation reveals -alert and oriented x 3 with no impairment of recent or remote memory. Mental Status-Normal.  Musculoskeletal Normal Exam - Left-Upper Extremity Strength Normal and Lower Extremity Strength Normal. Normal Exam - Right-Upper Extremity Strength Normal and Lower Extremity Strength Normal.  Lymphatic Head & Neck  General Head & Neck Lymphatics: Bilateral - Description - Normal. Axillary  General Axillary Region: Bilateral - Description - Normal. Tenderness - Non Tender. Femoral & Inguinal  Generalized Femoral & Inguinal Lymphatics: Bilateral - Description - Normal. Tenderness - Non Tender.    Assessment & Plan  ACHALASIA (K22.0) Impression: Patient is a  75 year old male with type II achalasia. 1. We will proceed with robotic esophagomyotomy, and esophageal wrap. 2. I did discuss with the patient is wife the risks and benefits of the procedure to include but not limited to: Infection, bleeding, damage to surrounding structures, possible need for chest tube placement, possible esophageal leak. The patient was understanding and wishes to proceed.

## 2015-12-18 NOTE — Transfer of Care (Signed)
Immediate Anesthesia Transfer of Care Note  Patient: Caleb Wolf  Procedure(s) Performed: Procedure(s): XI ROBOTIC HELLER MYOTOMY  ESOPHAGEAL WRAP (N/A) UPPER GI ENDOSCOPY  Patient Location: PACU  Anesthesia Type:General  Level of Consciousness: awake  Airway & Oxygen Therapy: Patient connected to face mask oxygen  Post-op Assessment: Report given to RN and Post -op Vital signs reviewed and stable  Post vital signs: Reviewed and stable  Last Vitals:  Filed Vitals:   12/18/15 0716  BP: 152/60  Pulse: 51  Temp: 37.2 C  Resp: 18    Last Pain: There were no vitals filed for this visit.    Patients Stated Pain Goal: 2 ([id number])  Complications: No apparent anesthesia complications

## 2015-12-18 NOTE — Op Note (Signed)
12/18/2015  11:55 AM  PATIENT:  Caleb Wolf  75 y.o. male  PRE-OPERATIVE DIAGNOSIS:  achalasia  POST-OPERATIVE DIAGNOSIS:  achalasia  PROCEDURE:  Procedure(s): XI ROBOTIC HELLER MYOTOMY  DOR ESOPHAGEAL WRAP (N/A) UPPER GI ENDOSCOPY  SURGEON:  Surgeon(s) and Role:    * Ralene Ok, MD - Primary    * Mickeal Skinner, MD - Assisting  ANESTHESIA:   local and general  EBL:  Total I/O In: 1500 [I.V.:1500] Out: 290 [Urine:240; Blood:15]  BLOOD ADMINISTERED:none  DRAINS: none   LOCAL MEDICATIONS USED:  BUPIVICAINE   SPECIMEN:  No Specimen  DISPOSITION OF SPECIMEN:  N/A  COUNTS:  YES  TOURNIQUET:  * No tourniquets in log *  DICTATION: .Dragon Dictation The patient was taken back to the operating room and placed in the supine position with bilateral SCDs in place. The patient was prepped and draped in the usual sterile fashion. After appropriate antibiotics were confirmed a timeout was called and all facts were verified.   A Veress needle technique was used to insufflate the abdomen to 15 mm of mercury the paramedian stab incision. Subsequent to this an 8 mm trocar was introduced as was a 8 millimeter camera. At this time the subsequent robotic trochars x3, were then placed adjacent to this trocar approximately 8-10 cm away. Each trocar was inserted under direct visualization, there were total of 4 trochars. The assistant trocar was then placed in the right lower quadrant under direct visualization. The Nathanson retractor was then visualized inserted into the abdomen and the incision just to the left of the falciform ligament. This was then placed to retract the liver appropriately. At this time the patient was positioned in reverse Trendelenburg.   At this time the robot patient cart was brought to the bedside and placed in good position and the arms were docked to the trochars appropriately. At this time I proceeded to incised the gastrohepatic ligament.  At this time I  proceeded to mobilize the stomach inferiorly and visualize the right crus. The peritoneum over the right crus was incised and right crus was identified. I proceeded to dissect this inferiorly until the left crus was seen joining the right crus. Once the right crus was adequately dissected we turned our to the left crus which was dissected away. This required traction of the stomach to the right side. Once this was visualized we then proceeded to circumferentially dissect the esophagus away from the surrounding tissue. At this time a Penrose drain was placed around the esophagus to help with retraction.  I mobilized the esophagus cephalad approximately 5-6cm, clearing away the surrounding tissue.   At this time using the hook the esophageal fibers were individually cauterized with the esophagus. The esophageal serosa was easily seen bulging through the incised muscle fibers. We carried this dissection cephalad. This was carried approximately 5 cm cephalad and measured with a ruler. At this time we proceeded to carry the myotomy caudad onto the body of the stomach. We did this approximately 3 cm onto the body of the stomach. The serosa appeared unharmed.  At this time an EGD scope was then placed into the mouth and down into the esophagus with minimal insufflation. This was easily taken into the stomach. There appeared to be no injury to the esophagus both endoscopically and laparoscopically. With insufflation appeared that there were no fibers that were left constricting the serosa of the esophagus.  At this time the greater curvature was brought anterior to the esophagus, in  a Dor fashion, and sutured using 0 silk sutures interrupted fashion.  The stomach was sutured to the crus and the esophageal muscle fibers laterally 4.  The Dor fundoplication appeared snug however was not constricting the esophagus.  At this time we revisualized stomach and it appeared to be lying appropriately or the esophagus.   At  this time the robot was undocked. The liver trocar was removed. At this time insufflation was evacuated. Skin was reapproximated for Monocryl subcuticular fashion. The skin was then dressed with Dermabond. The patient tolerated the procedure well and was taken to the recovery room in stable condition.   PLAN OF CARE: Admit for overnight observation  PATIENT DISPOSITION:  PACU - hemodynamically stable.   Delay start of Pharmacological VTE agent (>24hrs) due to surgical blood loss or risk of bleeding: not applicable

## 2015-12-18 NOTE — Anesthesia Procedure Notes (Signed)
Procedure Name: Intubation Date/Time: 12/18/2015 9:26 AM Performed by: Gaston Islam EVETTE Pre-anesthesia Checklist: Patient identified, Emergency Drugs available, Suction available, Patient being monitored and Timeout performed Patient Re-evaluated:Patient Re-evaluated prior to inductionOxygen Delivery Method: Circle system utilized Preoxygenation: Pre-oxygenation with 100% oxygen Intubation Type: IV induction Ventilation: Mask ventilation without difficulty Laryngoscope Size: Miller and 2 Grade View: Grade I Tube type: Oral Tube size: 7.5 mm Number of attempts: 1 Airway Equipment and Method: Patient positioned with wedge pillow Placement Confirmation: breath sounds checked- equal and bilateral,  ETT inserted through vocal cords under direct vision,  positive ETCO2 and CO2 detector Secured at: 22 cm Tube secured with: Tape Dental Injury: Teeth and Oropharynx as per pre-operative assessment

## 2015-12-18 NOTE — Anesthesia Postprocedure Evaluation (Signed)
Anesthesia Post Note  Patient: Tyree Talati Sanjurjo  Procedure(s) Performed: Procedure(s) (LRB): XI ROBOTIC HELLER MYOTOMY  ESOPHAGEAL WRAP (N/A) UPPER GI ENDOSCOPY  Patient location during evaluation: PACU Anesthesia Type: General Level of consciousness: awake and alert Pain management: pain level controlled Vital Signs Assessment: post-procedure vital signs reviewed and stable Respiratory status: spontaneous breathing, nonlabored ventilation, respiratory function stable and patient connected to nasal cannula oxygen Cardiovascular status: blood pressure returned to baseline and stable Postop Assessment: no signs of nausea or vomiting Anesthetic complications: no    Last Vitals:  Filed Vitals:   12/18/15 1400 12/18/15 1417  BP: 187/73 181/64  Pulse: 83 93  Temp:  36.3 C  Resp: 13 14    Last Pain:  Filed Vitals:   12/18/15 1518  PainSc: 3                  Natalija Mavis,JAMES TERRILL

## 2015-12-18 NOTE — Anesthesia Preprocedure Evaluation (Addendum)
Anesthesia Evaluation  Patient identified by MRN, date of birth, ID band Patient awake    Reviewed: Allergy & Precautions, NPO status , Patient's Chart, lab work & pertinent test results  History of Anesthesia Complications (+) PONV  Airway Mallampati: II  TM Distance: >3 FB Neck ROM: Full    Dental  (+) Teeth Intact, Dental Advisory Given   Pulmonary sleep apnea , former smoker,    breath sounds clear to auscultation       Cardiovascular hypertension, + CAD, + Past MI and + Peripheral Vascular Disease  + dysrhythmias  Rhythm:Regular Rate:Normal  Has been stable from cardiac standpoint. EF mildly depressed   Neuro/Psych negative neurological ROS     GI/Hepatic Neg liver ROS, GERD  ,  Endo/Other  negative endocrine ROS  Renal/GU negative Renal ROS     Musculoskeletal  (+) Arthritis ,   Abdominal (+) + obese,   Peds  Hematology   Anesthesia Other Findings   Reproductive/Obstetrics                            Anesthesia Physical Anesthesia Plan  ASA: III  Anesthesia Plan: General   Post-op Pain Management:    Induction: Intravenous  Airway Management Planned: Oral ETT  Additional Equipment:   Intra-op Plan:   Post-operative Plan: Extubation in OR  Informed Consent: I have reviewed the patients History and Physical, chart, labs and discussed the procedure including the risks, benefits and alternatives for the proposed anesthesia with the patient or authorized representative who has indicated his/her understanding and acceptance.     Plan Discussed with:   Anesthesia Plan Comments:         Anesthesia Quick Evaluation

## 2015-12-19 ENCOUNTER — Inpatient Hospital Stay (HOSPITAL_COMMUNITY): Payer: Medicare Other

## 2015-12-19 LAB — BASIC METABOLIC PANEL
Anion gap: 6 (ref 5–15)
BUN: 16 mg/dL (ref 6–20)
CO2: 25 mmol/L (ref 22–32)
CREATININE: 1.07 mg/dL (ref 0.61–1.24)
Calcium: 8.6 mg/dL — ABNORMAL LOW (ref 8.9–10.3)
Chloride: 108 mmol/L (ref 101–111)
GFR calc Af Amer: >60 mL/min (ref >60)
GLUCOSE: 140 mg/dL — AB (ref 65–99)
POTASSIUM: 4.6 mmol/L (ref 3.5–5.1)
SODIUM: 139 mmol/L (ref 135–145)

## 2015-12-19 LAB — CBC
HEMATOCRIT: 39 % (ref 39.0–52.0)
Hemoglobin: 12.7 g/dL — ABNORMAL LOW (ref 13.0–17.0)
MCH: 29.8 pg (ref 26.0–34.0)
MCHC: 32.6 g/dL (ref 30.0–36.0)
MCV: 91.5 fL (ref 78.0–100.0)
PLATELETS: 174 10*3/uL (ref 150–400)
RBC: 4.26 MIL/uL (ref 4.22–5.81)
RDW: 14.4 % (ref 11.5–15.5)
WBC: 11.6 10*3/uL — AB (ref 4.0–10.5)

## 2015-12-19 MED ORDER — HYDROCODONE-ACETAMINOPHEN 7.5-325 MG/15ML PO SOLN
10.0000 mL | ORAL | Status: DC | PRN
Start: 2015-12-19 — End: 2015-12-20

## 2015-12-19 MED ORDER — FUROSEMIDE 40 MG PO TABS
40.0000 mg | ORAL_TABLET | Freq: Every day | ORAL | Status: DC
  Administered 2015-12-20: 40 mg via ORAL
  Filled 2015-12-19: qty 1

## 2015-12-19 MED ORDER — LISINOPRIL 5 MG PO TABS
5.0000 mg | ORAL_TABLET | Freq: Every day | ORAL | Status: DC
  Administered 2015-12-19 – 2015-12-20 (×2): 5 mg via ORAL
  Filled 2015-12-19 (×2): qty 1

## 2015-12-19 MED ORDER — IOPAMIDOL (ISOVUE-300) INJECTION 61%
50.0000 mL | Freq: Once | INTRAVENOUS | Status: AC | PRN
  Administered 2015-12-19: 50 mL via INTRAVENOUS

## 2015-12-19 MED ORDER — COLCHICINE 0.6 MG PO TABS
0.6000 mg | ORAL_TABLET | Freq: Every day | ORAL | Status: DC | PRN
  Filled 2015-12-19: qty 1

## 2015-12-19 MED ORDER — FINASTERIDE 5 MG PO TABS
5.0000 mg | ORAL_TABLET | Freq: Every day | ORAL | Status: DC
  Administered 2015-12-19 – 2015-12-20 (×2): 5 mg via ORAL
  Filled 2015-12-19 (×2): qty 1

## 2015-12-19 MED ORDER — DILTIAZEM HCL 30 MG PO TABS
30.0000 mg | ORAL_TABLET | Freq: Three times a day (TID) | ORAL | Status: DC
  Administered 2015-12-20: 30 mg via ORAL
  Filled 2015-12-19 (×3): qty 1

## 2015-12-19 MED ORDER — POTASSIUM CHLORIDE CRYS ER 20 MEQ PO TBCR
20.0000 meq | EXTENDED_RELEASE_TABLET | Freq: Every day | ORAL | Status: DC
  Administered 2015-12-20: 20 meq via ORAL
  Filled 2015-12-19: qty 1

## 2015-12-19 MED ORDER — METOPROLOL TARTRATE 12.5 MG HALF TABLET
12.5000 mg | ORAL_TABLET | Freq: Two times a day (BID) | ORAL | Status: DC
  Administered 2015-12-19 – 2015-12-20 (×3): 12.5 mg via ORAL
  Filled 2015-12-19 (×3): qty 1

## 2015-12-19 NOTE — Progress Notes (Signed)
Patient placed self on CPAP. Patient is tolerating well. RT will continue to monitor.

## 2015-12-19 NOTE — Plan of Care (Signed)
Problem: Food- and Nutrition-Related Knowledge Deficit (NB-1.1) Goal: Nutrition education Formal process to instruct or train a patient/client in a skill or to impart knowledge to help patients/clients voluntarily manage or modify food choices and eating behavior to maintain or improve health. Outcome: Completed/Met Date Met:  12/19/15  Provided Clear and Full Liquid Diet Handout. Encouraged pt to consume decaf tea and coffee, broths, and juices. Encourage pt to consume strained cream soups, Milk, Ice Cream, Sherbet and Blended yogurts, in addition to nutrition supplements. Pt will avoid chocolate and caffeinated beverages.  Current diet order is Clear Liquids, Pt is currently consuming 0% of meals Labs and Medications reviewed.  Satira Anis. Arionna Hoggard, MS, RD LDN Inpatient Clinical Dietitian Pager [phone]

## 2015-12-19 NOTE — Progress Notes (Signed)
1 Day Post-Op  Subjective: Pt doing well this aM.  Ambulated already this AM.min pain   Objective: Vital signs in last 24 hours: Temp:  [97.4 F (36.3 C)-98.9 F (37.2 C)] 97.6 F (36.4 C) (07/19 0500) Pulse Rate:  [51-93] 70 (07/19 0500) Resp:  [12-18] 16 (07/19 0500) BP: (134-193)/(52-86) 134/61 mmHg (07/19 0500) SpO2:  [95 %-100 %] 97 % (07/19 0500) Weight:  [106.777 kg (235 lb 6.4 oz)] 106.777 kg (235 lb 6.4 oz) (07/18 0750) Last BM Date: 12/17/15  Intake/Output from previous day: 07/18 0701 - 07/19 0700 In: 2325 [I.V.:2325] Out: 1240 [Urine:1190; Blood:50] Intake/Output this shift: Total I/O In: 825 [I.V.:825] Out: 600 [Urine:600]  General appearance: alert and cooperative GI: soft, approp ttp, nd incision c/d/i  Lab Results:   Recent Labs  12/19/15 0420  WBC 11.6*  HGB 12.7*  HCT 39.0  PLT 174   BMET  Recent Labs  12/19/15 0420  NA 139  K 4.6  CL 108  CO2 25  GLUCOSE 140*  BUN 16  CREATININE 1.07  CALCIUM 8.6*    Assessment/Plan: s/p Procedure(s): XI ROBOTIC HELLER MYOTOMY  ESOPHAGEAL WRAP (N/A) UPPER GI ENDOSCOPY Will await swallow study to eval esoph s/p heller myotomy.  If OK will adv to liquid diet. Mobilize Home tomorrow pending RADS    LOS: 1 day    Rosario Jacks., Anne Hahn 12/19/2015

## 2015-12-20 MED ORDER — HYDROCODONE-ACETAMINOPHEN 7.5-325 MG/15ML PO SOLN: 10.0000 mL | ORAL | Status: DC | PRN

## 2015-12-20 NOTE — Progress Notes (Signed)
Discharge instructions discussed with patient and family, verbalized understanding and agreement. Educated patient and wife on how to crush medications, verbalized understanding and agreement.  Prescriptions given to family

## 2015-12-20 NOTE — Discharge Summary (Signed)
Physician Discharge Summary  Patient ID: Caleb Wolf MRN: [id number] DOB/AGE: 73-Jun-1942 73 y.o.  Admit date: 12/18/2015 Discharge date: 12/20/2015  Admission Diagnoses: achalasia  Discharge Diagnoses:  Active Problems:   Achalasia s/p heller myotomy and Dor fundoplication  Discharged Condition: good  Hospital Course: 75 y/o M with achalasia.  He was admitted post op.  He did well from a pain standpoint.  Esophagogram show no leak POD 1.  He was started a a liquid diet and tolerated that well.  He was ambulating well on his own.  He was afebrile, and deemed stable for DC and Dc'd home.  Consults: None  Significant Diagnostic Studies: as above  Treatments: surgery: as above  Discharge Exam: Blood pressure 139/56, pulse 50, temperature 97.5 F (36.4 C), temperature source Oral, resp. rate 16, height 5\' 10"  (1.778 m), weight 106.777 kg (235 lb 6.4 oz), SpO2 99 %. General appearance: alert and cooperative GI: soft, non-tender; bowel sounds normal; no masses,  no organomegaly and incisions c/d/i  Disposition: 01-Home or Self Care  Discharge Instructions    Diet - low sodium heart healthy    Complete by:  As directed      Increase activity slowly    Complete by:  As directed             Medication List    TAKE these medications        aspirin 81 MG tablet  Take 1 tablet (81 mg total) by mouth daily.     atorvastatin 40 MG tablet  Commonly known as:  LIPITOR  Take 1 tablet (40 mg total) by mouth daily.     colchicine 0.6 MG tablet  Take 0.6 mg by mouth as needed (gout).     diltiazem 30 MG tablet  Commonly known as:  CARDIZEM  TAKE 1 TABLET(30 MG) BY MOUTH THREE TIMES DAILY BEFORE MEALS     doxazosin 2 MG tablet  Commonly known as:  CARDURA  Take 2 mg by mouth daily.     finasteride 5 MG tablet  Commonly known as:  PROSCAR  Take 5 mg by mouth at bedtime.     furosemide 40 MG tablet  Commonly known as:  LASIX  TAKE ONE TABLET BY MOUTH DAILY.     HYDROcodone-acetaminophen 7.5-325 mg/15 ml solution  Commonly known as:  HYCET  Take 10 mLs by mouth every 4 (four) hours as needed for moderate pain.     lisinopril 5 MG tablet  Commonly known as:  PRINIVIL,ZESTRIL  TAKE 1 TABLET BY MOUTH ONCE DAILY.     metoprolol tartrate 25 MG tablet  Commonly known as:  LOPRESSOR  Take 0.5 tablets (12.5 mg total) by mouth 2 (two) times daily.     nitroGLYCERIN 0.4 MG SL tablet  Commonly known as:  NITROSTAT  Place 1 tablet (0.4 mg total) under the tongue every 5 (five) minutes as needed for chest pain.     pantoprazole 40 MG tablet  Commonly known as:  PROTONIX  Take 1 tablet (40 mg total) by mouth daily before breakfast.     potassium chloride SA 20 MEQ tablet  Commonly known as:  K-DUR,KLOR-CON  TAKE ONE TABLET BY MOUTH DAILY.           Follow-up Information    Follow up with Caleb Ivan, MD. Schedule an appointment as soon as possible for a visit in 2 weeks.   Specialty:  General Surgery   Why:  For wound re-check  Contact information:   [place] [address] [phone]       Signed: Rosario Jacks., Anne Hahn 12/20/2015, 8:02 AM

## 2015-12-20 NOTE — Discharge Instructions (Signed)
EATING AFTER YOUR ESOPHAGEAL SURGERY (Stomach Fundoplication, Hiatal Hernia repair, Achalasia surgery, etc)  ######################################################################  EAT Start with a pureed / full liquid diet (see below) Gradually transition to a high fiber diet with a fiber supplement over the next month after discharge.    WALK Walk an hour a day.  Control your pain to do that.    CONTROL PAIN Control pain so that you can walk, sleep, tolerate sneezing/coughing, go up/down stairs.  HAVE A BOWEL MOVEMENT DAILY Keep your bowels regular to avoid problems.  OK to try a laxative to override constipation.  OK to use an antidairrheal to slow down diarrhea.  Call if not better after 2 tries  CALL IF YOU HAVE PROBLEMS/CONCERNS Call if you are still struggling despite following these instructions. Call if you have concerns not answered by these instructions  ######################################################################   After your esophageal surgery, expect some sticking with swallowing over the next 1-2 months.    If food sticks when you eat, it is called "dysphagia".  This is due to swelling around your esophagus at the wrap & hiatal diaphragm repair.  It will gradually ease off over the next few months.  To help you through this temporary phase, we start you out on a pureed (blenderized) diet.  Your first meal in the hospital was thin liquids.  You should have been given a pureed diet by the time you left the hospital.  We ask patients to stay on a pureed diet for the first 2-3 weeks to avoid anything getting "stuck" near your recent surgery.  Don't be alarmed if your ability to swallow doesn't progress according to this plan.  Everyone is different and some diets can advance more or less quickly.     Some BASIC RULES to follow are:  Maintain an upright position whenever eating or drinking.  Take small bites - just a teaspoon size bite at a time.  Eat slowly.   It may also help to eat only one food at a time.  Consider nibbling through smaller, more frequent meals & avoid the urge to eat BIG meals  Do not push through feelings of fullness, nausea, or bloatedness  Do not mix solid foods and liquids in the same mouthful  Try not to "wash foods down" with large gulps of liquids.  Avoid carbonated (bubbly/fizzy) drinks.    Avoid foods that make you feel gassy or bloated.  Start with bland foods first.  Wait on trying greasy, fried, or spicy meals until you are tolerating more bland solids well.  Understand that it will be hard to burp and belch at first.  This gradually improves with time.  Expect to be more gassy/flatulent/bloated initially.  Walking will help your body manage it better.  Consider using medications for bloating that contain simethicone such as  Maalox or Gas-X   Eat in a relaxed atmosphere & minimize distractions.  Avoid talking while eating.    Do not use straws.  Following each meal, sit in an upright position (90 degree angle) for 60 to 90 minutes.  Going for a short walk can help as well  If food does stick, don't panic.  Try to relax and let the food pass on its own.  Sipping WARM LIQUID such as strong hot black tea can also help slide it down.   Be gradual in changes & use common sense:  -If you easily tolerating a certain "level" of foods, advance to the next level gradually -If you are  having trouble swallowing a particular food, then avoid it.   -If food is sticking when you advance your diet, go back to thinner previous diet (the lower LEVEL) for 1-2 days.  LEVEL 1 = PUREED DIET  Do for the first 2 WEEKS AFTER SURGERY  -Foods in this group are pureed or blenderized to a smooth, mashed potato-like consistency.  -If necessary, the pureed foods can keep their shape with the addition of a thickening agent.   -Meat should be pureed to a smooth, pasty consistency.  Hot broth or gravy may be added to the pureed  meat, approximately 1 oz. of liquid per 3 oz. serving of meat. -CAUTION:  If any foods do not puree into a smooth consistency, swallowing will be more difficult.  (For example, nuts or seeds sometimes do not blend well.)  Hot Foods Cold Foods  Pureed scrambled eggs and cheese Pureed cottage cheese  Baby cereals Thickened juices and nectars  Thinned cooked cereals (no lumps) Thickened milk or eggnog  Pureed [place] toast or pancakes Ensure  Mashed potatoes Ice cream  Pureed parsley, au gratin, scalloped potatoes, candied sweet potatoes Fruit or [place] ice, sherbet  Pureed buttered or alfredo noodles Plain yogurt  Pureed vegetables (no corn or peas) Instant breakfast  Pureed soups and creamed soups Smooth pudding, mousse, custard  Pureed scalloped apples Whipped gelatin  Gravies Sugar, syrup, honey, jelly  Sauces, cheese, tomato, barbecue, white, creamed Cream  Any baby food Creamer  Alcohol in moderation (not beer or champagne) Margarine  Coffee or tea Mayonnaise   Ketchup, mustard   Apple sauce   SAMPLE MENU:  PUREED DIET Breakfast Lunch Dinner   Orange juice, 1/2 cup  Cream of wheat, 1/2 cup  Pineapple juice, 1/2 cup  Pureed [place], barley soup, 3/4 cup  Pureed Hawaiian chicken, 3 oz   Scrambled eggs, mashed or blended with cheese, 1/2 cup  Tea or coffee, 1 cup   Whole milk, 1 cup   Non-dairy creamer, 2 Tbsp.  Mashed potatoes, 1/2 cup  Pureed cooled broccoli, 1/2 cup  Apple sauce, 1/2 cup  Coffee or tea  Mashed potatoes, 1/2 cup  Pureed spinach, 1/2 cup  Frozen yogurt, 1/2 cup  Tea or coffee   TROUBLESHOOTING IRREGULAR BOWELS  1) Avoid extremes of bowel movements (no bad constipation/diarrhea)  2) Miralax 17gm mixed in 8oz. water or juice-daily. May use BID as needed.  3) Gas-x,Phazyme, etc. as needed for gas & bloating.  4) Soft,bland diet. No spicy,greasy,fried foods.  5) Prilosec over-the-counter as needed  6) May hold gluten/wheat products from diet  to see if symptoms improve.  7) May try probiotics (Align, Activa, etc) to help calm the bowels down  7) If symptoms become worse call back immediately.    If you have any questions please call our office at [place]: [phone].

## 2015-12-21 ENCOUNTER — Encounter (INDEPENDENT_AMBULATORY_CARE_PROVIDER_SITE_OTHER): Payer: Self-pay

## 2015-12-28 ENCOUNTER — Other Ambulatory Visit: Payer: Self-pay | Admitting: *Deleted

## 2015-12-28 MED ORDER — POTASSIUM CHLORIDE CRYS ER 20 MEQ PO TBCR: 20.0000 meq | EXTENDED_RELEASE_TABLET | Freq: Every day | ORAL | 6 refills | Status: DC

## 2015-12-28 MED ORDER — FUROSEMIDE 40 MG PO TABS: 40.0000 mg | ORAL_TABLET | Freq: Every day | ORAL | 6 refills | Status: DC

## 2015-12-28 NOTE — Telephone Encounter (Signed)
Patient called office requesting refills for lasix and K+. Doses and directions verified with patient while on the phone. Both rx sent to pharmacy.

## 2016-01-10 ENCOUNTER — Other Ambulatory Visit (INDEPENDENT_AMBULATORY_CARE_PROVIDER_SITE_OTHER): Payer: Self-pay | Admitting: Internal Medicine

## 2016-01-14 DIAGNOSIS — H2512 Age-related nuclear cataract, left eye: Secondary | ICD-10-CM | POA: Diagnosis not present

## 2016-01-14 DIAGNOSIS — H43812 Vitreous degeneration, left eye: Secondary | ICD-10-CM | POA: Diagnosis not present

## 2016-01-14 DIAGNOSIS — H4720 Unspecified optic atrophy: Secondary | ICD-10-CM | POA: Diagnosis not present

## 2016-01-14 DIAGNOSIS — H43392 Other vitreous opacities, left eye: Secondary | ICD-10-CM | POA: Diagnosis not present

## 2016-02-07 ENCOUNTER — Other Ambulatory Visit (INDEPENDENT_AMBULATORY_CARE_PROVIDER_SITE_OTHER): Payer: Self-pay | Admitting: Internal Medicine

## 2016-02-19 ENCOUNTER — Ambulatory Visit (INDEPENDENT_AMBULATORY_CARE_PROVIDER_SITE_OTHER): Payer: Medicare Other | Admitting: Internal Medicine

## 2016-02-19 ENCOUNTER — Encounter (INDEPENDENT_AMBULATORY_CARE_PROVIDER_SITE_OTHER): Payer: Self-pay | Admitting: Internal Medicine

## 2016-02-19 VITALS — BP 130/70 | HR 56 | Temp 97.9°F | Ht 70.0 in | Wt 232.2 lb

## 2016-02-19 DIAGNOSIS — K220 Achalasia of cardia: Secondary | ICD-10-CM

## 2016-02-19 DIAGNOSIS — K219 Gastro-esophageal reflux disease without esophagitis: Secondary | ICD-10-CM | POA: Diagnosis not present

## 2016-02-19 MED ORDER — PANTOPRAZOLE SODIUM 40 MG PO TBEC: 40.0000 mg | DELAYED_RELEASE_TABLET | Freq: Every day | ORAL | 3 refills | Status: DC

## 2016-02-19 NOTE — Progress Notes (Signed)
Presenting complaint;  History of achalasia status post surgery 2 months ago. GERD.  Database and subjective:  Patient is 75 year old Caucasian male who was evaluated earlier this year for dysphagia and found to have achalasia. He underwent robotic Heller myotomy with DOR wrap on 12/18/2015. He did well following surgery and stayed on full liquids for a few days and now he is back on his usual diet. He feels a lot better. He slowly and chews his food well. Since his surgery he has had 5 episodes of heartburn. He feels pantoprazole is helping. He needs a new prescription. Only time he has swallowing difficulty is when he eats rapidly and forgets to chew his food. He denies nausea vomiting abdominal pain melena or rectal bleeding. His bowels move daily.    Current Medications: Outpatient Encounter Prescriptions as of 02/19/2016  Medication Sig  . aspirin 81 MG tablet Take 1 tablet (81 mg total) by mouth daily.  Marland Kitchen atorvastatin (LIPITOR) 40 MG tablet Take 1 tablet (40 mg total) by mouth daily. (Patient taking differently: Take 40 mg by mouth every evening. )  . colchicine 0.6 MG tablet Take 0.6 mg by mouth as needed (gout).   Marland Kitchen doxazosin (CARDURA) 2 MG tablet Take 2 mg by mouth daily.  . finasteride (PROSCAR) 5 MG tablet Take 5 mg by mouth at bedtime.   . furosemide (LASIX) 40 MG tablet Take 1 tablet (40 mg total) by mouth daily.  Marland Kitchen lisinopril (PRINIVIL,ZESTRIL) 5 MG tablet TAKE 1 TABLET BY MOUTH ONCE DAILY.  . metoprolol tartrate (LOPRESSOR) 25 MG tablet Take 0.5 tablets (12.5 mg total) by mouth 2 (two) times daily.  . nitroGLYCERIN (NITROSTAT) 0.4 MG SL tablet Place 1 tablet (0.4 mg total) under the tongue every 5 (five) minutes as needed for chest pain.  . pantoprazole (PROTONIX) 40 MG tablet TAKE 1 TABLET BY MOUTH DAILY BEFORE BREAKFAST  . potassium chloride SA (K-DUR,KLOR-CON) 20 MEQ tablet Take 1 tablet (20 mEq total) by mouth daily.  . [DISCONTINUED] diltiazem (CARDIZEM) 30 MG tablet TAKE  1 TABLET(30 MG) BY MOUTH THREE TIMES DAILY BEFORE MEALS (Patient not taking: Reported on 02/19/2016)  . [DISCONTINUED] HYDROcodone-acetaminophen (HYCET) 7.5-325 mg/15 ml solution Take 10 mLs by mouth every 4 (four) hours as needed for moderate pain. (Patient not taking: Reported on 02/19/2016)   No facility-administered encounter medications on file as of 02/19/2016.      Objective: Blood pressure 130/70, pulse (!) 56, temperature 97.9 F (36.6 C), temperature source Oral, height 5\' 10"  (1.778 m), weight 232 lb 3.2 oz (105.3 kg). Asian is alert and in no acute distress. Conjunctiva is pink. Sclera is nonicteric Oropharyngeal mucosa is normal. No neck masses or thyromegaly noted. Cardiac exam with regular rhythm normal S1 and S2. No murmur or gallop noted. Lungs are clear to auscultation. Abdomen abdomen is full with well-healed laparoscopy scars. Abdomen soft and nontender without organomegaly or masses. No LE edema or clubbing noted.   Assessment:  #1. Achalasia. Status post robotic Heller myotomy with wrap 2 months ago by Dr. Sherrie Sport... Patient has had excellent results.  Consider surveillance EGD in 2 years. #2. GERD. Patient is doing well with PPI. He will need to continue therapy indefinitely as he is at risk for reflux injury because of underlying esophageal dysmotility.  #3. Patient is average risk for CRC. Last exam was in 2009 and next one would be in 2019.   Plan:  New prescription given for pantoprazole 40 mg by mouth every morning 90 doses with  3 refills. Office visit in one year.

## 2016-02-19 NOTE — Patient Instructions (Addendum)
Call if you have swallowing difficulty or pantoprazole stops working.

## 2016-02-28 ENCOUNTER — Other Ambulatory Visit: Payer: Self-pay

## 2016-02-28 MED ORDER — LISINOPRIL 5 MG PO TABS: 5.0000 mg | ORAL_TABLET | Freq: Every day | ORAL | 2 refills | Status: DC

## 2016-02-28 NOTE — Telephone Encounter (Signed)
Refilled lisinopril

## 2016-03-03 ENCOUNTER — Other Ambulatory Visit: Payer: Self-pay | Admitting: *Deleted

## 2016-03-03 MED ORDER — METOPROLOL TARTRATE 25 MG PO TABS: 12.5000 mg | ORAL_TABLET | Freq: Two times a day (BID) | ORAL | 0 refills | Status: DC

## 2016-03-05 DIAGNOSIS — Z471 Aftercare following joint replacement surgery: Secondary | ICD-10-CM | POA: Diagnosis not present

## 2016-03-05 DIAGNOSIS — Z96652 Presence of left artificial knee joint: Secondary | ICD-10-CM | POA: Diagnosis not present

## 2016-03-05 DIAGNOSIS — Z96641 Presence of right artificial hip joint: Secondary | ICD-10-CM | POA: Diagnosis not present

## 2016-03-12 ENCOUNTER — Ambulatory Visit (HOSPITAL_COMMUNITY): Payer: Medicare Other | Attending: Physician Assistant | Admitting: Physical Therapy

## 2016-03-12 DIAGNOSIS — M25551 Pain in right hip: Secondary | ICD-10-CM | POA: Insufficient documentation

## 2016-03-12 DIAGNOSIS — M6281 Muscle weakness (generalized): Secondary | ICD-10-CM | POA: Diagnosis not present

## 2016-03-12 DIAGNOSIS — R262 Difficulty in walking, not elsewhere classified: Secondary | ICD-10-CM | POA: Diagnosis not present

## 2016-03-12 NOTE — Therapy (Signed)
Caleb Wolf, [place], [address] Phone: [phone]   Fax:  [phone]  Physical Therapy Evaluation  Patient Details  Name: Caleb Wolf MRN: [id number] Date of Birth: 08/26/1940 Referring Provider: Gerrit Halls PA-C   Encounter Date: 03/12/2016      PT End of Session - 03/12/16 1539    Visit Number 1   Number of Visits 8   Date for PT Re-Evaluation 04/09/16   Authorization Type Medicare    Authorization Time Period 03/12/16 to 04/12/16   Authorization - Visit Number 1   Authorization - Number of Visits 10   PT Start Time [id number]   PT Stop Time 1427   PT Time Calculation (min) 39 min   Activity Tolerance Patient tolerated treatment well   Behavior During Therapy [facility] for tasks assessed/performed      Past Medical History:  Diagnosis Date  . Arthritis   . Basal cell carcinoma   . Carotid stenosis    07/04/11- 50-70%bilaterally- stable per Dr Willey Blade note  . Chronic back pain   . Coronary artery disease   . Dysrhythmia   . Enlarged prostate    takes Finasteride daily  . GERD (gastroesophageal reflux disease)   . Gout    takes Indocin daily as needed  . History of colon polyps   . History of kidney stones   . Hyperlipidemia    takes Simvastatin daily  . Hypertension   . Imbalance   . Joint pain   . Lower leg edema   . Lumbar disc disease   . Myocardial infarction    2015  . PONV (postoperative nausea and vomiting)    also hard to urinate after surgery  . Sleep apnea    uses CPAP  . Urinary urgency   . Wears glasses     Past Surgical History:  Procedure Laterality Date  . BACK SURGERY    . CARDIAC CATHETERIZATION  05/22/2014   Procedure: IABP INSERTION;  Surgeon: Leonie Man, MD;  Location: [facility] CATH LAB;  Service: Cardiovascular;;  . cataract surgery Right   . COLONOSCOPY    . CORONARY ARTERY BYPASS GRAFT N/A 05/22/2014   Procedure: CORONARY ARTERY BYPASS GRAFTING (CABG) times three using  left internal mammary and right saphenous vein.;  Surgeon: Melrose Nakayama, MD;  Location: [place];  Service: Open Heart Surgery;  Laterality: N/A;  . ENDARTERECTOMY Left 02/17/2014   Procedure: ENDARTERECTOMY CAROTID WITH PATCH ANGIOPLASTY;  Surgeon: Mal Misty, MD;  Location: [place];  Service: Vascular;  Laterality: Left;  . ESOPHAGEAL DILATION N/A 08/22/2015   Procedure: ESOPHAGEAL DILATION;  Surgeon: Rogene Houston, MD;  [facility];  Service: Endoscopy;  Laterality: N/A;  . ESOPHAGOGASTRODUODENOSCOPY N/A 08/22/2015   Procedure: ESOPHAGOGASTRODUODENOSCOPY (EGD);  Surgeon: Rogene Houston, MD;  [facility];  Service: Endoscopy;  Laterality: N/A;  12:45 - moved to 1:55 - Ann notified pt  . EYE SURGERY     cataract extraction (right) with repair macular tear , with IOL     right  . FRACTURE SURGERY     bilateral wrist fractures- one ORIF  . JOINT REPLACEMENT  2011   left knee  . LEFT HEART CATHETERIZATION WITH CORONARY ANGIOGRAM N/A 05/22/2014   Procedure: LEFT HEART CATHETERIZATION WITH CORONARY ANGIOGRAM;  Surgeon: Leonie Man, MD;  Location: [facility] CATH LAB;  Service: Cardiovascular;  Laterality: N/A;  . LITHOTRIPSY    . PARS PLANA VITRECTOMY W/  REPAIR OF MACULAR HOLE    . RHINOPLASTY    . TONSILLECTOMY    . TOTAL HIP ARTHROPLASTY  12/08/2011   Procedure: TOTAL HIP ARTHROPLASTY;  Surgeon: Gearlean Alf, MD;  [facility];  Service: Orthopedics;  Laterality: Right;  . UPPER GI ENDOSCOPY  12/18/2015   Procedure: UPPER GI ENDOSCOPY;  Surgeon: Ralene Ok, MD;  Location: [facility];  Service: General;;  . WRIST SURGERY Right 67yrs ago  . WRIST SURGERY Left     There were no vitals filed for this visit.       Subjective Assessment - 03/12/16 1357    Subjective Patient had a posterior hip replacement done in 2013 on the R; he recently noticed a little area sticking out more on the side of his hip as well as some increased pain so he went to the MD for a  checkup of his hip. The PA-C did images and was happy with how his hip was looking but did refer him to PT to continue to address the pain. He does not have a great deal of problems throughout his day, states no major limitations. No falls recently, no close calls.    Pertinent History nitroglycerin PRN, beta-blockers, cardiac stenosis, HTN, NSTEMI, cartoid artery stenosis, OSA, CABG x3, CAD, gout, history of back surgery (patient unsure what was done), posterior hip    How long can you sit comfortably? unlimited    How long can you stand comfortably? 15-20 minutes    How long can you walk comfortably? limited but not sure exact numbers/specifics    Patient Stated Goals eliminate pain if possible    Currently in Pain? Yes   Pain Score 4    Pain Location Hip   Pain Orientation Right   Pain Descriptors / Indicators Dull;Throbbing   Pain Type Chronic pain   Pain Radiating Towards can run down R LE occasionally    Pain Onset 1 to 4 weeks ago   Pain Frequency Intermittent   Aggravating Factors  getting up out of chair/first standing up    Pain Relieving Factors get off of LE    Effect of Pain on Daily Activities pushse through, just annoying             [facility] PT Assessment - 03/12/16 0001      Assessment   Medical Diagnosis R hip pain    Referring Provider Gerrit Halls PA-C    Onset Date/Surgical Date --  exacerbation started about 2 weeks ago    Next MD Visit maybe in 6 weeks if needed      Precautions   Precaution Comments R posterior hip done in 2013      Balance Screen   Has the patient fallen in the past 6 months No   Has the patient had a decrease in activity level because of a fear of falling?  Yes   Is the patient reluctant to leave their home because of a fear of falling?  No     Prior Function   Level of Independence Independent;Independent with basic ADLs;Independent with gait;Independent with transfers   Vocation Retired     Strength   Right Hip Flexion 3/5    Right Hip Extension 2+/5   Right Hip ABduction 3/5   Left Hip Flexion 3+/5   Left Hip Extension 3/5   Left Hip ABduction 3/5   Right Knee Flexion 4+/5   Right Knee Extension 5/5   Left Knee Flexion 4+/5   Left Knee  Extension 4+/5   Right Ankle Dorsiflexion 5/5   Left Ankle Dorsiflexion 5/5     Palpation   Palpation comment tenderness to palpation noted lateral R hip/area of TFL     Ambulation/Gait   Gait Comments possible LLD noted, pelvic tilt to R, flexed at hips, proximal muscle weakness      6 minute walk test results    Aerobic Endurance Distance Walked 678   Endurance additional comments 3MWT      High Level Balance   High Level Balance Comments SLS 30 seconds B LEs however intermittent UE touching                            PT Education - 03/12/16 1526    Education provided Yes   Education Details prognosis, POC, HEP    Person(s) Educated Patient   Methods Explanation;Demonstration          PT Short Term Goals - 03/12/16 1546      PT SHORT TERM GOAL #1   Title Patient to experience a 70% reduction in the frequency and intensity of his pain, when it occurs, in order to show general improvement in condition    Time 2   Period Weeks   Status New     PT SHORT TERM GOAL #2   Title Patient to be able to ambulate and stand unlimited distances/time with no exacerbation in pain in order to show general improvement in condition    Time 2   Period Weeks   Status New     PT SHORT TERM GOAL #3   Title Patient to be independent in correctly and consistently performing appropriate HEP, to be updated PRN    Time 2   Period Weeks   Status New           PT Long Term Goals - 03/12/16 1551      PT LONG TERM GOAL #1   Title Patient to demonstrate strength 5/5 in all tested muscle groups in order to assist in reduce pain and enhance function    Time 4   Period Weeks   Status New     PT LONG TERM GOAL #2   Title Patient to be unlimited in  recreational and exercise based activities of unlimited duration with no increase in pain in order to improve QOL    Time 4   Period Weeks   Status New     PT LONG TERM GOAL #3   Title Patient to be participatory in regular weight bearing aerobic exercise program, at least 20 minutes, at least 4 days per week, in order to maintain functional gains and improve general health status    Time 4   Period Weeks   Status New               Plan - 03/12/16 1541    Clinical Impression Statement  Patient arrives with chronic complaints of R hip pain and discomfort that has been exacerbated over the past couple of weeks; he states that at first he had a hard area sticking out but it seems to have gone away. Upon examination patient does reveal significant gait deficits as well as quite a bit of functional muscle weakness which may likely be exacerbating the intermittent pain/discomfort he feels in the area of his R Hip. Note that he did have a R posterior hip replacement done in 2013 and has reported minimal problems with this  prosthetic. Noted tenderness to palpation directly over R TFL muscle belly/proximal ITB today as well.  Recommend short stent of skilled PT services to address functional limitations and assist in reaching optimal level of function.    Rehab Potential Good   Clinical Impairments Affecting Rehab Potential chronicity of symptoms   PT Frequency 2x / week   PT Duration 4 weeks   PT Treatment/Interventions ADLs/Self Care Home Management;Biofeedback;Cryotherapy;Moist Heat;DME Instruction;Gait training;Stair training;Functional mobility training;Therapeutic activities;Therapeutic exercise;Balance training;Neuromuscular re-education;Patient/family education;Manual techniques;Scar mobilization;Passive range of motion;Energy conservation;Taping   PT Next Visit Plan assign HEP (proximal strengthening); focus on functional strength and balance moving forward; trial Nustep for strengthening  strategies    PT Home Exercise Plan assign 2nd session    Consulted and Agree with Plan of Care Patient      Patient will benefit from skilled therapeutic intervention in order to improve the following deficits and impairments:  Abnormal gait, Improper body mechanics, Pain, Decreased coordination, Decreased mobility, Postural dysfunction, Decreased strength, Decreased balance, Difficulty walking, Impaired flexibility  Visit Diagnosis: Pain in right hip - Plan: PT plan of care cert/re-cert  Muscle weakness (generalized) - Plan: PT plan of care cert/re-cert  Difficulty in walking, not elsewhere classified - Plan: PT plan of care cert/re-cert      G-Codes - [id number]    Functional Assessment Tool Used Based on skilled clinical assessment of strength, gait, balance, pain patterns    Functional Limitation Mobility: Walking and moving around   Mobility: Walking and Moving Around Current Status [id number]) At least 20 percent but less than 40 percent impaired, limited or restricted   Mobility: Walking and Moving Around Goal Status [id number]) At least 1 percent but less than 20 percent impaired, limited or restricted       Problem List Patient Active Problem List   Diagnosis Date Noted  . Achalasia 12/18/2015  . Encounter for therapeutic drug monitoring 05/31/2014  . Atrial fibrillation with RVR ([place]) 05/22/2014  . 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 Abnormal EKG   . NSTEMI (non-ST elevated myocardial infarction) ([place])   . Carotid artery stenosis without cerebral infarction   . Chest pain 02/09/2014  . HTN (hypertension) 02/09/2014  . Carotid stenosis 08/02/2013  . Edema 08/02/2013  . OSA (obstructive sleep apnea) 09/21/2012  . Difficulty in walking(719.7) 01/29/2012  . Balance disorder 01/29/2012  . OA (osteoarthritis) of hip 12/08/2011    Deniece Ree PT, DPT [place] [address] [place], [place], [address] Phone:  [phone]   Fax:  [phone]  Name: Caleb Wolf MRN: [id number] Date of Birth: 03/31/1941

## 2016-03-13 ENCOUNTER — Ambulatory Visit (HOSPITAL_COMMUNITY): Payer: Medicare Other

## 2016-03-13 DIAGNOSIS — M25551 Pain in right hip: Secondary | ICD-10-CM | POA: Diagnosis not present

## 2016-03-13 DIAGNOSIS — M6281 Muscle weakness (generalized): Secondary | ICD-10-CM

## 2016-03-13 DIAGNOSIS — R262 Difficulty in walking, not elsewhere classified: Secondary | ICD-10-CM | POA: Diagnosis not present

## 2016-03-13 NOTE — Patient Instructions (Signed)
Bridging    Slowly raise buttocks from floor, keeping stomach tight. Repeat 10-15 times per set. Do 2 sets per session. Do 1-2 sessions per day.  http://orth.exer.us/1096   Copyright  VHI. All rights reserved.   Abduction    Slide one leg out to side. Keep kneecap pointing up. Gently bring leg back to pillow. Repeat with other leg. Repeat 10 times. Do 2 sessions per day.  http://gt2.exer.us/373   Copyright  VHI. All rights reserved.   Straight Leg Raise    Tighten stomach and slowly raise locked right leg 10 inches from floor. Repeat 10 times per set. Do 2 sets per session. Do 1-2 sessions per day.  http://orth.exer.us/1102   Copyright  VHI. All rights reserved.    Functional Quadriceps: Sit to Stand    Sit on edge of chair, feet flat on floor. Stand upright, extending knees fully. Repeat 10 times per set. Do 1-2 sets per session.   http://orth.exer.us/734   Copyright  VHI. All rights reserved.

## 2016-03-13 NOTE — Therapy (Signed)
[place], [place], [address] Phone: [phone]   Fax:  [phone]  Physical Therapy Treatment  Patient Details  Name: Caleb Wolf MRN: [id number] Date of Birth: Jun 19, 1940 Referring Provider: Gerrit Halls PA-C   Encounter Date: 03/13/2016      PT End of Session - 03/13/16 1354    Visit Number 2   Number of Visits 8   Date for PT Re-Evaluation 04/09/16   Authorization Type Medicare    Authorization Time Period 03/12/16 to 04/12/16   Authorization - Visit Number 2   Authorization - Number of Visits 10   PT Start Time [id number]   PT Stop Time 1427   PT Time Calculation (min) 38 min      Past Medical History:  Diagnosis Date  . Arthritis   . Basal cell carcinoma   . Carotid stenosis    07/04/11- 50-70%bilaterally- stable per Dr Willey Blade note  . Chronic back pain   . Coronary artery disease   . Dysrhythmia   . Enlarged prostate    takes Finasteride daily  . GERD (gastroesophageal reflux disease)   . Gout    takes Indocin daily as needed  . History of colon polyps   . History of kidney stones   . Hyperlipidemia    takes Simvastatin daily  . Hypertension   . Imbalance   . Joint pain   . Lower leg edema   . Lumbar disc disease   . Myocardial infarction    2015  . PONV (postoperative nausea and vomiting)    also hard to urinate after surgery  . Sleep apnea    uses CPAP  . Urinary urgency   . Wears glasses     Past Surgical History:  Procedure Laterality Date  . BACK SURGERY    . CARDIAC CATHETERIZATION  05/22/2014   Procedure: IABP INSERTION;  Surgeon: Leonie Man, MD;  Location: [facility] CATH LAB;  Service: Cardiovascular;;  . cataract surgery Right   . COLONOSCOPY    . CORONARY ARTERY BYPASS GRAFT N/A 05/22/2014   Procedure: CORONARY ARTERY BYPASS GRAFTING (CABG) times three using left internal mammary and right saphenous vein.;  Surgeon: Melrose Nakayama, MD;  Location: [place];  Service:  Open Heart Surgery;  Laterality: N/A;  . ENDARTERECTOMY Left 02/17/2014   Procedure: ENDARTERECTOMY CAROTID WITH PATCH ANGIOPLASTY;  Surgeon: Mal Misty, MD;  Location: [place];  Service: Vascular;  Laterality: Left;  . ESOPHAGEAL DILATION N/A 08/22/2015   Procedure: ESOPHAGEAL DILATION;  Surgeon: Rogene Houston, MD;  [facility];  Service: Endoscopy;  Laterality: N/A;  . ESOPHAGOGASTRODUODENOSCOPY N/A 08/22/2015   Procedure: ESOPHAGOGASTRODUODENOSCOPY (EGD);  Surgeon: Rogene Houston, MD;  [facility];  Service: Endoscopy;  Laterality: N/A;  12:45 - moved to 1:55 - Ann notified pt  . EYE SURGERY     cataract extraction (right) with repair macular tear , with IOL     right  . FRACTURE SURGERY     bilateral wrist fractures- one ORIF  . JOINT REPLACEMENT  2011   left knee  . LEFT HEART CATHETERIZATION WITH CORONARY ANGIOGRAM N/A 05/22/2014   Procedure: LEFT HEART CATHETERIZATION WITH CORONARY ANGIOGRAM;  Surgeon: Leonie Man, MD;  Location: [facility] CATH LAB;  Service: Cardiovascular;  Laterality: N/A;  . LITHOTRIPSY    . PARS PLANA VITRECTOMY W/ REPAIR OF MACULAR HOLE    . RHINOPLASTY    . TONSILLECTOMY    .  TOTAL HIP ARTHROPLASTY  12/08/2011   Procedure: TOTAL HIP ARTHROPLASTY;  Surgeon: Gearlean Alf, MD;  [facility];  Service: Orthopedics;  Laterality: Right;  . UPPER GI ENDOSCOPY  12/18/2015   Procedure: UPPER GI ENDOSCOPY;  Surgeon: Ralene Ok, MD;  [facility];  Service: General;;  . WRIST SURGERY Right 29yrs ago  . WRIST SURGERY Left     There were no vitals filed for this visit.      Subjective Assessment - 03/13/16 1349    Subjective Pt stated his lateral Rt hip pain scale 4/10 soreness.  Pt brought in perscription dexamethazone into dept today.   Pertinent History nitroglycerin PRN, beta-blockers, cardiac stenosis, HTN, NSTEMI, cartoid artery stenosis, OSA, CABG x3, CAD, gout, history of back surgery (patient unsure what was done),  posterior hip    Patient Stated Goals eliminate pain if possible    Currently in Pain? Yes   Pain Score 4    Pain Location Hip   Pain Orientation Right;Lateral   Pain Descriptors / Indicators Sore   Pain Type Chronic pain   Pain Radiating Towards can run down lateral Rt LE   Pain Onset 1 to 4 weeks ago   Pain Frequency Intermittent   Aggravating Factors  getting up out of chair/first standing up   Pain Relieving Factors get off of LE   Effect of Pain on Daily Activities pushes through, just annoying             [facility] Adult PT Treatment/Exercise - 03/13/16 0001      Exercises   Exercises Knee/Hip     Knee/Hip Exercises: Stretches   Active Hamstring Stretch 3 reps;30 seconds   Passive Hamstring Stretch Limitations .   Quad Stretch 3 reps;30 seconds     Knee/Hip Exercises: Supine   Bridges 15 reps   Straight Leg Raises 10 reps   Other Supine Knee/Hip Exercises Supine Abduction 10x (Cueing to reduce ER)     Knee/Hip Exercises: Sidelying   Hip ABduction 10 reps     Knee/Hip Exercises: Prone   Hip Extension 10 reps                PT Education - 03/13/16 1430    Education provided Yes   Education Details Reviewed goals, establised HEP, copy of eval given to pt.  Pt educated on puropse of dexamethazone   Person(s) Educated Patient   Methods Explanation;Demonstration;Handout   Comprehension Verbalized understanding;Returned demonstration          PT Short Term Goals - 03/12/16 1546      PT SHORT TERM GOAL #1   Title Patient to experience a 70% reduction in the frequency and intensity of his pain, when it occurs, in order to show general improvement in condition    Time 2   Period Weeks   Status New     PT SHORT TERM GOAL #2   Title Patient to be able to ambulate and stand unlimited distances/time with no exacerbation in pain in order to show general improvement in condition    Time 2   Period Weeks   Status New     PT SHORT TERM GOAL #3   Title  Patient to be independent in correctly and consistently performing appropriate HEP, to be updated PRN    Time 2   Period Weeks   Status New           PT Long Term Goals - 03/12/16 1551  PT LONG TERM GOAL #1   Title Patient to demonstrate strength 5/5 in all tested muscle groups in order to assist in reduce pain and enhance function    Time 4   Period Weeks   Status New     PT LONG TERM GOAL #2   Title Patient to be unlimited in recreational and exercise based activities of unlimited duration with no increase in pain in order to improve QOL    Time 4   Period Weeks   Status New     PT LONG TERM GOAL #3   Title Patient to be participatory in regular weight bearing aerobic exercise program, at least 20 minutes, at least 4 days per week, in order to maintain functional gains and improve general health status    Time 4   Period Weeks   Status New               Plan - 03/13/16 1359    Clinical Impression Statement Reviewed goals, established HEP and copy of eval given to pt.  Pt brought perscription Dexamethazone into dept this session.  Discussion held wiht evaluation PT and pt educated on purpose of dexa for pain relief, session focus on proximal musculature strengthening for pain control, ionto not complete this session.  Session focus on improving proximal musculature strengthening with min cueing for proper form and technique.  Pt able to demonstrate appropriate technqiue wtih all HEP exercises established this session.  No reports of increased pain through session.     Rehab Potential Good   Clinical Impairments Affecting Rehab Potential chronicity of symptoms   PT Frequency 2x / week   PT Duration 4 weeks   PT Treatment/Interventions ADLs/Self Care Home Management;Biofeedback;Cryotherapy;Moist Heat;DME Instruction;Gait training;Stair training;Functional mobility training;Therapeutic activities;Therapeutic exercise;Balance training;Neuromuscular  re-education;Patient/family education;Manual techniques;Scar mobilization;Passive range of motion;Energy conservation;Taping   PT Next Visit Plan  focus on functional strength and balance moving forward; trial Nustep for strengthening strategies    PT Home Exercise Plan 03/12/2016: bridges, SLR, Supine ABD and sit to stand      Patient will benefit from skilled therapeutic intervention in order to improve the following deficits and impairments:  Abnormal gait, Improper body mechanics, Pain, Decreased coordination, Decreased mobility, Postural dysfunction, Decreased strength, Decreased balance, Difficulty walking, Impaired flexibility  Visit Diagnosis: Pain in right hip  Muscle weakness (generalized)  Difficulty in walking, not elsewhere classified    Problem List Patient Active Problem List   Diagnosis Date Noted  . Achalasia 12/18/2015  . Encounter for therapeutic drug monitoring 05/31/2014  . Atrial fibrillation with RVR ([place]) 05/22/2014  . 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 Abnormal EKG   . NSTEMI (non-ST elevated myocardial infarction) ([place])   . Carotid artery stenosis without cerebral infarction   . Chest pain 02/09/2014  . HTN (hypertension) 02/09/2014  . Carotid stenosis 08/02/2013  . Edema 08/02/2013  . OSA (obstructive sleep apnea) 09/21/2012  . Difficulty in walking(719.7) 01/29/2012  . Balance disorder 01/29/2012  . OA (osteoarthritis) of hip 12/08/2011   Ihor Austin, LPTA; CBIS [phone]  Aldona Lento 03/13/2016, 2:31 PM  [place] [address] [place], [place], [address] Phone: [phone]   Fax:  [phone]  Name: Caleb Wolf MRN: [id number] Date of Birth: Mar 15, 1941

## 2016-03-18 ENCOUNTER — Ambulatory Visit (HOSPITAL_COMMUNITY): Payer: Medicare Other | Admitting: Physical Therapy

## 2016-03-18 DIAGNOSIS — M25551 Pain in right hip: Secondary | ICD-10-CM | POA: Diagnosis not present

## 2016-03-18 DIAGNOSIS — R262 Difficulty in walking, not elsewhere classified: Secondary | ICD-10-CM

## 2016-03-18 DIAGNOSIS — M6281 Muscle weakness (generalized): Secondary | ICD-10-CM

## 2016-03-18 NOTE — Therapy (Signed)
[place], [place], [address] Phone: [phone]   Fax:  [phone]  Physical Therapy Treatment  Patient Details  Name: Caleb Wolf MRN: [id number] Date of Birth: 24-Sep-1940 Referring Provider: Gerrit Halls PA-C   Encounter Date: 03/18/2016      PT End of Session - 03/18/16 1123    Visit Number 3   Number of Visits 8   Date for PT Re-Evaluation 04/09/16   Authorization Type Medicare    Authorization Time Period 03/12/16 to 04/12/16   Authorization - Visit Number 3   Authorization - Number of Visits 10   PT Start Time 0948   PT Stop Time 1027   PT Time Calculation (min) 39 min   Activity Tolerance Patient tolerated treatment well   Behavior During Therapy [facility] for tasks assessed/performed      Past Medical History:  Diagnosis Date  . Arthritis   . Basal cell carcinoma   . Carotid stenosis    07/04/11- 50-70%bilaterally- stable per Dr Willey Blade note  . Chronic back pain   . Coronary artery disease   . Dysrhythmia   . Enlarged prostate    takes Finasteride daily  . GERD (gastroesophageal reflux disease)   . Gout    takes Indocin daily as needed  . History of colon polyps   . History of kidney stones   . Hyperlipidemia    takes Simvastatin daily  . Hypertension   . Imbalance   . Joint pain   . Lower leg edema   . Lumbar disc disease   . Myocardial infarction    2015  . PONV (postoperative nausea and vomiting)    also hard to urinate after surgery  . Sleep apnea    uses CPAP  . Urinary urgency   . Wears glasses     Past Surgical History:  Procedure Laterality Date  . BACK SURGERY    . CARDIAC CATHETERIZATION  05/22/2014   Procedure: IABP INSERTION;  Surgeon: Leonie Man, MD;  Location: [facility] CATH LAB;  Service: Cardiovascular;;  . cataract surgery Right   . COLONOSCOPY    . CORONARY ARTERY BYPASS GRAFT N/A 05/22/2014   Procedure: CORONARY ARTERY BYPASS GRAFTING (CABG) times three using  left internal mammary and right saphenous vein.;  Surgeon: Melrose Nakayama, MD;  Location: [place];  Service: Open Heart Surgery;  Laterality: N/A;  . ENDARTERECTOMY Left 02/17/2014   Procedure: ENDARTERECTOMY CAROTID WITH PATCH ANGIOPLASTY;  Surgeon: Mal Misty, MD;  Location: [place];  Service: Vascular;  Laterality: Left;  . ESOPHAGEAL DILATION N/A 08/22/2015   Procedure: ESOPHAGEAL DILATION;  Surgeon: Rogene Houston, MD;  [facility];  Service: Endoscopy;  Laterality: N/A;  . ESOPHAGOGASTRODUODENOSCOPY N/A 08/22/2015   Procedure: ESOPHAGOGASTRODUODENOSCOPY (EGD);  Surgeon: Rogene Houston, MD;  [facility];  Service: Endoscopy;  Laterality: N/A;  12:45 - moved to 1:55 - Ann notified pt  . EYE SURGERY     cataract extraction (right) with repair macular tear , with IOL     right  . FRACTURE SURGERY     bilateral wrist fractures- one ORIF  . JOINT REPLACEMENT  2011   left knee  . LEFT HEART CATHETERIZATION WITH CORONARY ANGIOGRAM N/A 05/22/2014   Procedure: LEFT HEART CATHETERIZATION WITH CORONARY ANGIOGRAM;  Surgeon: Leonie Man, MD;  Location: [facility] CATH LAB;  Service: Cardiovascular;  Laterality: N/A;  . LITHOTRIPSY    . PARS PLANA VITRECTOMY W/  REPAIR OF MACULAR HOLE    . RHINOPLASTY    . TONSILLECTOMY    . TOTAL HIP ARTHROPLASTY  12/08/2011   Procedure: TOTAL HIP ARTHROPLASTY;  Surgeon: Gearlean Alf, MD;  [facility];  Service: Orthopedics;  Laterality: Right;  . UPPER GI ENDOSCOPY  12/18/2015   Procedure: UPPER GI ENDOSCOPY;  Surgeon: Ralene Ok, MD;  Location: [facility];  Service: General;;  . WRIST SURGERY Right 68yrs ago  . WRIST SURGERY Left     There were no vitals filed for this visit.      Subjective Assessment - 03/18/16 0951    Subjective Patient arrives today stating his R hip is a little aggravated this morning, he is still concerned about the protrusion on his R hip    Pertinent History nitroglycerin PRN, beta-blockers, cardiac  stenosis, HTN, NSTEMI, cartoid artery stenosis, OSA, CABG x3, CAD, gout, history of back surgery (patient unsure what was done), posterior hip    Currently in Pain? Yes   Pain Score 4                          [facility] Adult PT Treatment/Exercise - 03/18/16 0001      Knee/Hip Exercises: Standing   Heel Raises Both;1 set;20 reps   Heel Raises Limitations heel and toe    Forward Lunges Both;1 set;10 reps   Forward Lunges Limitations 4 inch box    Hip Abduction Both;1 set;10 reps   Abduction Limitations 2 second hold    Lateral Step Up Both;1 set;15 reps   Lateral Step Up Limitations 4 inch box    Forward Step Up Both;1 set;15 reps   Forward Step Up Limitations 4 inch box    Rocker Board 2 minutes   Rocker Board Limitations AP and lateral, B UE assist      Knee/Hip Exercises: Seated   Sit to Sand 1 set;15 reps     Knee/Hip Exercises: Supine   Bridges 15 reps   Other Supine Knee/Hip Exercises supine hip ABD with red TB 1x10     Knee/Hip Exercises: Sidelying   Clams with red TB 1x15 B      Knee/Hip Exercises: Prone   Hip Extension 15 reps             Balance Exercises - 03/18/16 1120      Balance Exercises: Standing   Tandem Stance Eyes open;2 reps   SLS Eyes open;Solid surface;2 reps           PT Education - 03/18/16 1123    Education provided No          PT Short Term Goals - 03/12/16 1546      PT SHORT TERM GOAL #1   Title Patient to experience a 70% reduction in the frequency and intensity of his pain, when it occurs, in order to show general improvement in condition    Time 2   Period Weeks   Status New     PT SHORT TERM GOAL #2   Title Patient to be able to ambulate and stand unlimited distances/time with no exacerbation in pain in order to show general improvement in condition    Time 2   Period Weeks   Status New     PT SHORT TERM GOAL #3   Title Patient to be independent in correctly and consistently performing appropriate  HEP, to be updated PRN    Time 2   Period Weeks  Status New           PT Long Term Goals - 03/12/16 1551      PT LONG TERM GOAL #1   Title Patient to demonstrate strength 5/5 in all tested muscle groups in order to assist in reduce pain and enhance function    Time 4   Period Weeks   Status New     PT LONG TERM GOAL #2   Title Patient to be unlimited in recreational and exercise based activities of unlimited duration with no increase in pain in order to improve QOL    Time 4   Period Weeks   Status New     PT LONG TERM GOAL #3   Title Patient to be participatory in regular weight bearing aerobic exercise program, at least 20 minutes, at least 4 days per week, in order to maintain functional gains and improve general health status    Time 4   Period Weeks   Status New               Plan - 03/18/16 1124    Clinical Impression Statement Focused on functional strengthening in OKC and CKC positions today with emphasis on proximal musculature; also performed balance based exercises inside of the parallel bars as well. Patient continues to express curiosity about use of dexamethasone and was educated that if his pain/hip aggravation has not reduced by his 6-7th visit, PT will try iontophoresis. Noted significant difficulty with balance exercises likely related to significant functional weakness.    Rehab Potential Good   Clinical Impairments Affecting Rehab Potential chronicity of symptoms   PT Frequency 2x / week   PT Duration 4 weeks   PT Treatment/Interventions ADLs/Self Care Home Management;Biofeedback;Cryotherapy;Moist Heat;DME Instruction;Gait training;Stair training;Functional mobility training;Therapeutic activities;Therapeutic exercise;Balance training;Neuromuscular re-education;Patient/family education;Manual techniques;Scar mobilization;Passive range of motion;Energy conservation;Taping   PT Next Visit Plan  focus on functional strength and balance moving forward;  trial Nustep for strengthening strategies    PT Home Exercise Plan 03/12/2016: bridges, SLR, Supine ABD and sit to stand   Consulted and Agree with Plan of Care Patient      Patient will benefit from skilled therapeutic intervention in order to improve the following deficits and impairments:  Abnormal gait, Improper body mechanics, Pain, Decreased coordination, Decreased mobility, Postural dysfunction, Decreased strength, Decreased balance, Difficulty walking, Impaired flexibility  Visit Diagnosis: Pain in right hip  Muscle weakness (generalized)  Difficulty in walking, not elsewhere classified     Problem List Patient Active Problem List   Diagnosis Date Noted  . Achalasia 12/18/2015  . Encounter for therapeutic drug monitoring 05/31/2014  . Atrial fibrillation with RVR ([place]) 05/22/2014  . 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 Abnormal EKG   . NSTEMI (non-ST elevated myocardial infarction) ([place])   . Carotid artery stenosis without cerebral infarction   . Chest pain 02/09/2014  . HTN (hypertension) 02/09/2014  . Carotid stenosis 08/02/2013  . Edema 08/02/2013  . OSA (obstructive sleep apnea) 09/21/2012  . Difficulty in walking(719.7) 01/29/2012  . Balance disorder 01/29/2012  . OA (osteoarthritis) of hip 12/08/2011    Deniece Ree PT, DPT [place] [address] [place], [place], [address] Phone: [phone]   Fax:  [phone]  Name: Caleb Wolf MRN: [id number] Date of Birth: [birth year]

## 2016-03-19 ENCOUNTER — Ambulatory Visit (HOSPITAL_COMMUNITY): Payer: Medicare Other | Admitting: Physical Therapy

## 2016-03-19 DIAGNOSIS — Z23 Encounter for immunization: Secondary | ICD-10-CM | POA: Diagnosis not present

## 2016-03-21 ENCOUNTER — Encounter: Payer: Self-pay | Admitting: Family

## 2016-03-24 ENCOUNTER — Ambulatory Visit (HOSPITAL_COMMUNITY): Payer: Medicare Other | Admitting: Physical Therapy

## 2016-03-24 DIAGNOSIS — M6281 Muscle weakness (generalized): Secondary | ICD-10-CM | POA: Diagnosis not present

## 2016-03-24 DIAGNOSIS — M25551 Pain in right hip: Secondary | ICD-10-CM | POA: Diagnosis not present

## 2016-03-24 DIAGNOSIS — R262 Difficulty in walking, not elsewhere classified: Secondary | ICD-10-CM | POA: Diagnosis not present

## 2016-03-24 NOTE — Therapy (Signed)
[place], [place], [address] Phone: [phone]   Fax:  [phone]  Physical Therapy Treatment  Patient Details  Name: Caleb Wolf MRN: [id number] Date of Birth: 09-Dec-1940 Referring Provider: Gerrit Halls PA-C   Encounter Date: 03/24/2016      PT End of Session - 03/24/16 0944    Visit Number 4   Number of Visits 8   Date for PT Re-Evaluation 04/09/16   Authorization Type Medicare    Authorization Time Period 03/12/16 to 04/12/16   Authorization - Visit Number 4   Authorization - Number of Visits 10   PT Start Time 0901   PT Stop Time 0941   PT Time Calculation (min) 40 min   Activity Tolerance Patient tolerated treatment well   Behavior During Therapy [facility] for tasks assessed/performed      Past Medical History:  Diagnosis Date  . Arthritis   . Basal cell carcinoma   . Carotid stenosis    07/04/11- 50-70%bilaterally- stable per Dr Willey Blade note  . Chronic back pain   . Coronary artery disease   . Dysrhythmia   . Enlarged prostate    takes Finasteride daily  . GERD (gastroesophageal reflux disease)   . Gout    takes Indocin daily as needed  . History of colon polyps   . History of kidney stones   . Hyperlipidemia    takes Simvastatin daily  . Hypertension   . Imbalance   . Joint pain   . Lower leg edema   . Lumbar disc disease   . Myocardial infarction    2015  . PONV (postoperative nausea and vomiting)    also hard to urinate after surgery  . Sleep apnea    uses CPAP  . Urinary urgency   . Wears glasses     Past Surgical History:  Procedure Laterality Date  . BACK SURGERY    . CARDIAC CATHETERIZATION  05/22/2014   Procedure: IABP INSERTION;  Surgeon: Leonie Man, MD;  Location: [facility] CATH LAB;  Service: Cardiovascular;;  . cataract surgery Right   . COLONOSCOPY    . CORONARY ARTERY BYPASS GRAFT N/A 05/22/2014   Procedure: CORONARY ARTERY BYPASS GRAFTING (CABG) times three using  left internal mammary and right saphenous vein.;  Surgeon: Melrose Nakayama, MD;  Location: [place];  Service: Open Heart Surgery;  Laterality: N/A;  . ENDARTERECTOMY Left 02/17/2014   Procedure: ENDARTERECTOMY CAROTID WITH PATCH ANGIOPLASTY;  Surgeon: Mal Misty, MD;  Location: [place];  Service: Vascular;  Laterality: Left;  . ESOPHAGEAL DILATION N/A 08/22/2015   Procedure: ESOPHAGEAL DILATION;  Surgeon: Rogene Houston, MD;  [facility];  Service: Endoscopy;  Laterality: N/A;  . ESOPHAGOGASTRODUODENOSCOPY N/A 08/22/2015   Procedure: ESOPHAGOGASTRODUODENOSCOPY (EGD);  Surgeon: Rogene Houston, MD;  [facility];  Service: Endoscopy;  Laterality: N/A;  12:45 - moved to 1:55 - Ann notified pt  . EYE SURGERY     cataract extraction (right) with repair macular tear , with IOL     right  . FRACTURE SURGERY     bilateral wrist fractures- one ORIF  . JOINT REPLACEMENT  2011   left knee  . LEFT HEART CATHETERIZATION WITH CORONARY ANGIOGRAM N/A 05/22/2014   Procedure: LEFT HEART CATHETERIZATION WITH CORONARY ANGIOGRAM;  Surgeon: Leonie Man, MD;  Location: [facility] CATH LAB;  Service: Cardiovascular;  Laterality: N/A;  . LITHOTRIPSY    . PARS PLANA VITRECTOMY W/  REPAIR OF MACULAR HOLE    . RHINOPLASTY    . TONSILLECTOMY    . TOTAL HIP ARTHROPLASTY  12/08/2011   Procedure: TOTAL HIP ARTHROPLASTY;  Surgeon: Gearlean Alf, MD;  [facility];  Service: Orthopedics;  Laterality: Right;  . UPPER GI ENDOSCOPY  12/18/2015   Procedure: UPPER GI ENDOSCOPY;  Surgeon: Ralene Ok, MD;  [facility];  Service: General;;  . WRIST SURGERY Right 74yrs ago  . WRIST SURGERY Left     There were no vitals filed for this visit.      Subjective Assessment - 03/24/16 0903    Subjective Patient arrives today stating he had a good weekend but he has had some increased pain starting last Thursday; he states that it is nothing he cannot live with.  It mostly happens when he is on his  feet and moving around, it does not bother him when he is stationary. No falls or close calls recently.    Pertinent History nitroglycerin PRN, beta-blockers, cardiac stenosis, HTN, NSTEMI, cartoid artery stenosis, OSA, CABG x3, CAD, gout, history of back surgery (patient unsure what was done), posterior hip    Currently in Pain? No/denies  but when it is bothering him, it is around 4-5/10                         [facility] Adult PT Treatment/Exercise - 03/24/16 0001      Knee/Hip Exercises: Stretches   Active Hamstring Stretch Both;2 reps;30 seconds   Gastroc Stretch Both;3 reps;30 seconds     Knee/Hip Exercises: Standing   Heel Raises Both;1 set;20 reps   Heel Raises Limitations heel and toe    Forward Step Up Both;1 set;15 reps   Forward Step Up Limitations 4 inch box      Knee/Hip Exercises: Supine   Bridges 20 reps   Other Supine Knee/Hip Exercises supine hip ABD with red TB 1x10     Knee/Hip Exercises: Sidelying   Clams with red TB 1x20 B      Knee/Hip Exercises: Prone   Hamstring Curl 1 set;10 reps   Hamstring Curl Limitations 3#   Hip Extension 15 reps     Manual Therapy   Manual Therapy Soft tissue mobilization   Manual therapy comments performed separately from all other skilled services    Soft tissue mobilization R glutes and mid-distal ITB                 PT Education - 03/24/16 0943    Education provided No          PT Short Term Goals - 03/12/16 1546      PT SHORT TERM GOAL #1   Title Patient to experience a 70% reduction in the frequency and intensity of his pain, when it occurs, in order to show general improvement in condition    Time 2   Period Weeks   Status New     PT SHORT TERM GOAL #2   Title Patient to be able to ambulate and stand unlimited distances/time with no exacerbation in pain in order to show general improvement in condition    Time 2   Period Weeks   Status New     PT SHORT TERM GOAL #3   Title Patient to  be independent in correctly and consistently performing appropriate HEP, to be updated PRN    Time 2   Period Weeks   Status New  PT Long Term Goals - 03/12/16 1551      PT LONG TERM GOAL #1   Title Patient to demonstrate strength 5/5 in all tested muscle groups in order to assist in reduce pain and enhance function    Time 4   Period Weeks   Status New     PT LONG TERM GOAL #2   Title Patient to be unlimited in recreational and exercise based activities of unlimited duration with no increase in pain in order to improve QOL    Time 4   Period Weeks   Status New     PT LONG TERM GOAL #3   Title Patient to be participatory in regular weight bearing aerobic exercise program, at least 20 minutes, at least 4 days per week, in order to maintain functional gains and improve general health status    Time 4   Period Weeks   Status New               Plan - 03/24/16 0944    Clinical Impression Statement Continued with functional stretching and strengthening today to patient tolerance; also performed some manual to soft tissue structures of R glutes, R ITB in an attempt to reduce aggravation/tenderness of R hip, noting some mild knotting and tender areas per patient report and pain relief noted with CKC tasks post manual as well as apparent quality release of soft tissue impairments today.    Rehab Potential Good   Clinical Impairments Affecting Rehab Potential chronicity of symptoms   PT Frequency 2x / week   PT Duration 4 weeks   PT Treatment/Interventions ADLs/Self Care Home Management;Biofeedback;Cryotherapy;Moist Heat;DME Instruction;Gait training;Stair training;Functional mobility training;Therapeutic activities;Therapeutic exercise;Balance training;Neuromuscular re-education;Patient/family education;Manual techniques;Scar mobilization;Passive range of motion;Energy conservation;Taping   PT Next Visit Plan  focus on functional strength and balance moving forward;  trial Nustep for strengthening strategie. Continue manual/STM work    Oncologist with Plan of Care Patient      Patient will benefit from skilled therapeutic intervention in order to improve the following deficits and impairments:  Abnormal gait, Improper body mechanics, Pain, Decreased coordination, Decreased mobility, Postural dysfunction, Decreased strength, Decreased balance, Difficulty walking, Impaired flexibility  Visit Diagnosis: Pain in right hip  Muscle weakness (generalized)  Difficulty in walking, not elsewhere classified     Problem List Patient Active Problem List   Diagnosis Date Noted  . Achalasia 12/18/2015  . Encounter for therapeutic drug monitoring 05/31/2014  . Atrial fibrillation with RVR ([place]) 05/22/2014  . 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 Abnormal EKG   . NSTEMI (non-ST elevated myocardial infarction) ([place])   . Carotid artery stenosis without cerebral infarction   . Chest pain 02/09/2014  . HTN (hypertension) 02/09/2014  . Carotid stenosis 08/02/2013  . Edema 08/02/2013  . OSA (obstructive sleep apnea) 09/21/2012  . Difficulty in walking(719.7) 01/29/2012  . Balance disorder 01/29/2012  . OA (osteoarthritis) of hip 12/08/2011    Deniece Ree PT, DPT [place] [address] [place], [place], [address] Phone: [phone]   Fax:  [phone]  Name: Caleb Wolf MRN: [id number] Date of Birth: 1940-12-31

## 2016-03-25 ENCOUNTER — Ambulatory Visit (INDEPENDENT_AMBULATORY_CARE_PROVIDER_SITE_OTHER): Payer: Medicare Other | Admitting: Family

## 2016-03-25 ENCOUNTER — Ambulatory Visit (HOSPITAL_COMMUNITY)
Admission: RE | Admit: 2016-03-25 | Discharge: 2016-03-25 | Disposition: A | Discharge status: Home / Self Care | Payer: Medicare Other | Source: Ambulatory Visit | Attending: Vascular Surgery | Admitting: Vascular Surgery

## 2016-03-25 ENCOUNTER — Encounter: Payer: Self-pay | Admitting: Family

## 2016-03-25 VITALS — BP 154/69 | HR 51 | Temp 97.9°F | Resp 16 | Ht 70.0 in | Wt 241.0 lb

## 2016-03-25 DIAGNOSIS — Z9889 Other specified postprocedural states: Secondary | ICD-10-CM

## 2016-03-25 DIAGNOSIS — I6521 Occlusion and stenosis of right carotid artery: Secondary | ICD-10-CM | POA: Diagnosis not present

## 2016-03-25 DIAGNOSIS — I6523 Occlusion and stenosis of bilateral carotid arteries: Secondary | ICD-10-CM | POA: Insufficient documentation

## 2016-03-25 DIAGNOSIS — I6522 Occlusion and stenosis of left carotid artery: Secondary | ICD-10-CM

## 2016-03-25 LAB — VAS US CAROTID
LCCAPDIAS: 20 cm/s
LCCAPSYS: 90 cm/s
LEFT ECA DIAS: -6 cm/s
LEFT VERTEBRAL DIAS: 16 cm/s
LICAPDIAS: -33 cm/s
Left CCA dist dias: -22 cm/s
Left CCA dist sys: -118 cm/s
Left ICA dist dias: -28 cm/s
Left ICA dist sys: -119 cm/s
Left ICA prox sys: -122 cm/s
RIGHT ECA DIAS: 1 cm/s
RIGHT VERTEBRAL DIAS: -12 cm/s

## 2016-03-25 NOTE — Patient Instructions (Signed)
Stroke Prevention Some medical conditions and behaviors are associated with an increased chance of having a stroke. You may prevent a stroke by making healthy choices and managing medical conditions. HOW CAN I REDUCE MY RISK OF HAVING A STROKE?   Stay physically active. Get at least 30 minutes of activity on most or all days.  Do not smoke. It may also be helpful to avoid exposure to secondhand smoke.  Limit alcohol use. Moderate alcohol use is considered to be:  No more than 2 drinks per day for men.  No more than 1 drink per day for nonpregnant women.  Eat healthy foods. This involves:  Eating 5 or more servings of fruits and vegetables a day.  Making dietary changes that address high blood pressure (hypertension), high cholesterol, diabetes, or obesity.  Manage your cholesterol levels.  Making food choices that are high in fiber and low in saturated fat, trans fat, and cholesterol may control cholesterol levels.  Take any prescribed medicines to control cholesterol as directed by your health care provider.  Manage your diabetes.  Controlling your carbohydrate and sugar intake is recommended to manage diabetes.  Take any prescribed medicines to control diabetes as directed by your health care provider.  Control your hypertension.  Making food choices that are low in salt (sodium), saturated fat, trans fat, and cholesterol is recommended to manage hypertension.  Ask your health care provider if you need treatment to lower your blood pressure. Take any prescribed medicines to control hypertension as directed by your health care provider.  If you are 18-39 years of age, have your blood pressure checked every 3-5 years. If you are 40 years of age or older, have your blood pressure checked every year.  Maintain a healthy weight.  Reducing calorie intake and making food choices that are low in sodium, saturated fat, trans fat, and cholesterol are recommended to manage  weight.  Stop drug abuse.  Avoid taking birth control pills.  Talk to your health care provider about the risks of taking birth control pills if you are over 35 years old, smoke, get migraines, or have ever had a blood clot.  Get evaluated for sleep disorders (sleep apnea).  Talk to your health care provider about getting a sleep evaluation if you snore a lot or have excessive sleepiness.  Take medicines only as directed by your health care provider.  For some people, aspirin or blood thinners (anticoagulants) are helpful in reducing the risk of forming abnormal blood clots that can lead to stroke. If you have the irregular heart rhythm of atrial fibrillation, you should be on a blood thinner unless there is a good reason you cannot take them.  Understand all your medicine instructions.  Make sure that other conditions (such as anemia or atherosclerosis) are addressed. SEEK IMMEDIATE MEDICAL CARE IF:   You have sudden weakness or numbness of the face, arm, or leg, especially on one side of the body.  Your face or eyelid droops to one side.  You have sudden confusion.  You have trouble speaking (aphasia) or understanding.  You have sudden trouble seeing in one or both eyes.  You have sudden trouble walking.  You have dizziness.  You have a loss of balance or coordination.  You have a sudden, severe headache with no known cause.  You have new chest pain or an irregular heartbeat. Any of these symptoms may represent a serious problem that is an emergency. Do not wait to see if the symptoms will   go away. Get medical help at once. Call your local emergency services (911 in [place] Do not drive yourself to the hospital.   This information is not intended to replace advice given to you by your health care provider. Make sure you discuss any questions you have with your health care provider.   Document Released: 06/26/2004 Document Revised: 06/09/2014 Document Reviewed:  11/19/2012 Elsevier Interactive Patient Education 2016 [employer]  

## 2016-03-25 NOTE — Progress Notes (Signed)
Chief Complaint: Follow up Extracranial Carotid Artery Stenosis   History of Present Illness  Caleb Wolf is a 75 y.o. malepatient of Dr. Kellie Simmering returns for continued follow-up regarding his left carotid endarterectomy site. He has a known right ICA occlusion.  He had a resection of a redundant segment of the left internal carotid artery with primary reanastomosis. He has had no neurologic symptoms since Dr. Kellie Simmering performed his surgery in September 2015 in conjunction with coronary artery bypass grafting.   He continues to take one aspirin per day. He denies lower extremity claudication symptoms. He is doing well from a cardiac standpoint.  He denies any known history of stroke or TIA. Specifically he denies a history of amaurosis fugax or monocular blindness, unilateral facial drooping, hemiplegia, or receptive or expressive aphasia.   Pt states his systolic blood pressure is usually in the 140's.   Pt Diabetic: no Pt smoker: former smoker, quit in 1984, smoked x 25 years  Pt meds include: Statin : yes ASA: yes Other anticoagulants/antiplatelets: no   Past Medical History:  Diagnosis Date  . Arthritis   . Basal cell carcinoma   . Carotid stenosis    07/04/11- 50-70%bilaterally- stable per Dr Willey Blade note  . Chronic back pain   . Coronary artery disease   . Dysrhythmia   . Enlarged prostate    takes Finasteride daily  . GERD (gastroesophageal reflux disease)   . Gout    takes Indocin daily as needed  . History of colon polyps   . History of kidney stones   . Hyperlipidemia    takes Simvastatin daily  . Hypertension   . Imbalance   . Joint pain   . Lower leg edema   . Lumbar disc disease   . Myocardial infarction    2015  . PONV (postoperative nausea and vomiting)    also hard to urinate after surgery  . Sleep apnea    uses CPAP  . Urinary urgency   . Wears glasses     Social History Social History  Substance Use Topics  . Smoking status: Former Smoker     Packs/day: 1.50    Years: 25.00    Types: Cigarettes    Start date: 08/08/1960    Quit date: 06/02/1982  . Smokeless tobacco: Never Used     Comment: quit smoking 30+yrs ago  . Alcohol use 0.0 oz/week     Comment: occasionally     Family History Family History  Problem Relation Age of Onset  . Allergies Mother   . Heart disease Mother   . Hypertension Mother   . Heart disease Father     MI    Surgical History Past Surgical History:  Procedure Laterality Date  . BACK SURGERY    . CARDIAC CATHETERIZATION  05/22/2014   Procedure: IABP INSERTION;  Surgeon: Leonie Man, MD;  Location: [facility] CATH LAB;  Service: Cardiovascular;;  . cataract surgery Right   . COLONOSCOPY    . CORONARY ARTERY BYPASS GRAFT N/A 05/22/2014   Procedure: CORONARY ARTERY BYPASS GRAFTING (CABG) times three using left internal mammary and right saphenous vein.;  Surgeon: Melrose Nakayama, MD;  Location: [place];  Service: Open Heart Surgery;  Laterality: N/A;  . ENDARTERECTOMY Left 02/17/2014   Procedure: ENDARTERECTOMY CAROTID WITH PATCH ANGIOPLASTY;  Surgeon: Mal Misty, MD;  Location: [place];  Service: Vascular;  Laterality: Left;  . ESOPHAGEAL DILATION N/A 08/22/2015   Procedure: ESOPHAGEAL DILATION;  Surgeon: Mechele Dawley  Laural Golden, MD;  [facility];  Service: Endoscopy;  Laterality: N/A;  . ESOPHAGOGASTRODUODENOSCOPY N/A 08/22/2015   Procedure: ESOPHAGOGASTRODUODENOSCOPY (EGD);  Surgeon: Rogene Houston, MD;  [facility];  Service: Endoscopy;  Laterality: N/A;  12:45 - moved to 1:55 - Ann notified pt  . EYE SURGERY     cataract extraction (right) with repair macular tear , with IOL     right  . FRACTURE SURGERY     bilateral wrist fractures- one ORIF  . JOINT REPLACEMENT  2011   left knee  . LEFT HEART CATHETERIZATION WITH CORONARY ANGIOGRAM N/A 05/22/2014   Procedure: LEFT HEART CATHETERIZATION WITH CORONARY ANGIOGRAM;  Surgeon: Leonie Man, MD;  Location: [facility] CATH LAB;  Service:  Cardiovascular;  Laterality: N/A;  . LITHOTRIPSY    . PARS PLANA VITRECTOMY W/ REPAIR OF MACULAR HOLE    . RHINOPLASTY    . TONSILLECTOMY    . TOTAL HIP ARTHROPLASTY  12/08/2011   Procedure: TOTAL HIP ARTHROPLASTY;  Surgeon: Gearlean Alf, MD;  Location: [facility];  Service: Orthopedics;  Laterality: Right;  . UPPER GI ENDOSCOPY  12/18/2015   Procedure: UPPER GI ENDOSCOPY;  Surgeon: Ralene Ok, MD;  Location: [facility];  Service: General;;  . WRIST SURGERY Right 37yrs ago  . WRIST SURGERY Left     Allergies  Allergen Reactions  . Codeine Sulfate Nausea Only    Current Outpatient Prescriptions  Medication Sig Dispense Refill  . aspirin 81 MG tablet Take 1 tablet (81 mg total) by mouth daily. 30 tablet   . atorvastatin (LIPITOR) 40 MG tablet Take 1 tablet (40 mg total) by mouth daily. (Patient taking differently: Take 40 mg by mouth every evening. ) 30 tablet 1  . colchicine 0.6 MG tablet Take 0.6 mg by mouth as needed (gout).     Marland Kitchen doxazosin (CARDURA) 2 MG tablet Take 2 mg by mouth daily.    . finasteride (PROSCAR) 5 MG tablet Take 5 mg by mouth at bedtime.     . furosemide (LASIX) 40 MG tablet Take 1 tablet (40 mg total) by mouth daily. (Patient taking differently: Take 40 mg by mouth as directed. 3 times per wk.) 30 tablet 6  . lisinopril (PRINIVIL,ZESTRIL) 5 MG tablet Take 1 tablet (5 mg total) by mouth daily. 90 tablet 2  . metoprolol tartrate (LOPRESSOR) 25 MG tablet Take 0.5 tablets (12.5 mg total) by mouth 2 (two) times daily. 90 tablet 0  . nitroGLYCERIN (NITROSTAT) 0.4 MG SL tablet Place 1 tablet (0.4 mg total) under the tongue every 5 (five) minutes as needed for chest pain. 25 tablet 3  . pantoprazole (PROTONIX) 40 MG tablet Take 1 tablet (40 mg total) by mouth daily before breakfast. 90 tablet 3  . potassium chloride SA (K-DUR,KLOR-CON) 20 MEQ tablet Take 1 tablet (20 mEq total) by mouth daily. (Patient taking differently: Take 20 mEq by mouth as directed. 3 times per wk.) 30  tablet 6   No current facility-administered medications for this visit.     Review of Systems : See HPI for pertinent positives and negatives.  Physical Examination  Vitals:   03/25/16 1139 03/25/16 1142  BP: (!) 157/64 (!) 154/69  Pulse: (!) 51   Resp: 16   Temp: 97.9 F (36.6 C)   TempSrc: Oral   SpO2: 100%   Weight: 241 lb (109.3 kg)   Height: 5\' 10"  (1.778 m)    Body mass index is 34.58 kg/m.  General: WDWN obese  male in NAD GAIT: normal Eyes: PERRLA Pulmonary:  Respirations are non-labored, good air movement, CTAB.  Cardiac: regular rhythm, no detected murmur.  VASCULAR EXAM Carotid Bruits Right Left   Negative Negative    Aorta is not palpable. Radial pulses are 1+ palpable and equal.                                                                                                                            LE Pulses Right Left       POPLITEAL  not palpable   not palpable       POSTERIOR TIBIAL  not palpable    palpable        DORSALIS PEDIS      ANTERIOR TIBIAL faintly palpable  Faintly palpable     Gastrointestinal: soft, nontender, BS WNL, no r/g, no palpable masses.  Musculoskeletal: no muscle atrophy/wasting. M/S 5/5 throughout, extremities without ischemic changes.  Neurologic: A&O X 3; Appropriate Affect, Speech is normal CN 2-12 intact, pain and light touch intact in extremities, Motor exam as listed above.    Assessment: Caleb Wolf is a 75 y.o. male who is s/p resection of a redundant segment of the left internal carotid artery with primary reanastomosis in September 2015 in conjunction with coronary artery bypass grafting.  He has no history of stroke or TIA.   DATA Today's carotid duplex suggests known occlusion of right ICA, near occlusion of right CCA. Left CEA site with <40% stenosis. Bilateral vertebral arteries are antegrade. Bilateral subclavian arteries are triphasic. No significant change compared to the exam of 03/20/15.    Plan: Follow-up in 1 year with Carotid Duplex scan.   I discussed in depth with the patient the nature of atherosclerosis, and emphasized the importance of maximal medical management including strict control of blood pressure, blood glucose, and lipid levels, obtaining regular exercise, and continued cessation of smoking.  The patient is aware that without maximal medical management the underlying atherosclerotic disease process will progress, limiting the benefit of any interventions. The patient was given information about stroke prevention and what symptoms should prompt the patient to seek immediate medical care. Thank you for allowing [place] to participate in this patient's care.  Clemon Chambers, RN, MSN, FNP-C [facility] Office: [phone]  Clinic Physician: Early  03/25/16 11:47 AM

## 2016-03-26 ENCOUNTER — Ambulatory Visit (HOSPITAL_COMMUNITY): Payer: Medicare Other

## 2016-03-26 DIAGNOSIS — R262 Difficulty in walking, not elsewhere classified: Secondary | ICD-10-CM

## 2016-03-26 DIAGNOSIS — M25551 Pain in right hip: Secondary | ICD-10-CM | POA: Diagnosis not present

## 2016-03-26 DIAGNOSIS — M6281 Muscle weakness (generalized): Secondary | ICD-10-CM

## 2016-03-26 NOTE — Therapy (Signed)
[place], [place], [address] Phone: [phone]   Fax:  [phone]  Physical Therapy Treatment  Patient Details  Name: Caleb Wolf MRN: [id number] Date of Birth: [birth year] Referring Provider: Gerrit Halls PA-C   Encounter Date: 03/26/2016      PT End of Session - 03/26/16 1350    Visit Number 5   Number of Visits 8   Date for PT Re-Evaluation 04/09/16   Authorization Type Medicare    Authorization Time Period 03/12/16 to 04/12/16   Authorization - Visit Number 5   Authorization - Number of Visits 10   PT Start Time [id number]   PT Stop Time 1428   PT Time Calculation (min) 40 min   Activity Tolerance Patient tolerated treatment well   Behavior During Therapy [facility] for tasks assessed/performed      Past Medical History:  Diagnosis Date  . Arthritis   . Basal cell carcinoma   . Carotid stenosis    07/04/11- 50-70%bilaterally- stable per Dr Willey Blade note  . Chronic back pain   . Coronary artery disease   . Dysrhythmia   . Enlarged prostate    takes Finasteride daily  . GERD (gastroesophageal reflux disease)   . Gout    takes Indocin daily as needed  . History of colon polyps   . History of kidney stones   . Hyperlipidemia    takes Simvastatin daily  . Hypertension   . Imbalance   . Joint pain   . Lower leg edema   . Lumbar disc disease   . Myocardial infarction    2015  . PONV (postoperative nausea and vomiting)    also hard to urinate after surgery  . Sleep apnea    uses CPAP  . Urinary urgency   . Wears glasses     Past Surgical History:  Procedure Laterality Date  . BACK SURGERY    . CARDIAC CATHETERIZATION  05/22/2014   Procedure: IABP INSERTION;  Surgeon: Leonie Man, MD;  Location: [facility] CATH LAB;  Service: Cardiovascular;;  . cataract surgery Right   . COLONOSCOPY    . CORONARY ARTERY BYPASS GRAFT N/A 05/22/2014   Procedure: CORONARY ARTERY BYPASS GRAFTING (CABG) times three using  left internal mammary and right saphenous vein.;  Surgeon: Melrose Nakayama, MD;  Location: [place];  Service: Open Heart Surgery;  Laterality: N/A;  . ENDARTERECTOMY Left 02/17/2014   Procedure: ENDARTERECTOMY CAROTID WITH PATCH ANGIOPLASTY;  Surgeon: Mal Misty, MD;  Location: [place];  Service: Vascular;  Laterality: Left;  . ESOPHAGEAL DILATION N/A 08/22/2015   Procedure: ESOPHAGEAL DILATION;  Surgeon: Rogene Houston, MD;  [facility];  Service: Endoscopy;  Laterality: N/A;  . ESOPHAGOGASTRODUODENOSCOPY N/A 08/22/2015   Procedure: ESOPHAGOGASTRODUODENOSCOPY (EGD);  Surgeon: Rogene Houston, MD;  [facility];  Service: Endoscopy;  Laterality: N/A;  12:45 - moved to 1:55 - Ann notified pt  . EYE SURGERY     cataract extraction (right) with repair macular tear , with IOL     right  . FRACTURE SURGERY     bilateral wrist fractures- one ORIF  . JOINT REPLACEMENT  2011   left knee  . LEFT HEART CATHETERIZATION WITH CORONARY ANGIOGRAM N/A 05/22/2014   Procedure: LEFT HEART CATHETERIZATION WITH CORONARY ANGIOGRAM;  Surgeon: Leonie Man, MD;  Location[facility] LAB;  Service: Cardiovascular;  Laterality: N/A;  . LITHOTRIPSY    . PARS PLANA VITRECTOMY W/  REPAIR OF MACULAR HOLE    . RHINOPLASTY    . TONSILLECTOMY    . TOTAL HIP ARTHROPLASTY  12/08/2011   Procedure: TOTAL HIP ARTHROPLASTY;  Surgeon: Gearlean Alf, MD;  [facility];  Service: Orthopedics;  Laterality: Right;  . UPPER GI ENDOSCOPY  12/18/2015   Procedure: UPPER GI ENDOSCOPY;  Surgeon: Ralene Ok, MD;  [facility];  Service: General;;  . WRIST SURGERY Right 83yrs ago  . WRIST SURGERY Left     There were no vitals filed for this visit.      Subjective Assessment - 03/26/16 1347    Subjective Pt stated his Rt hip pain scale 4/10 while standing, no pain in seated position.     Pertinent History nitroglycerin PRN, beta-blockers, cardiac stenosis, HTN, NSTEMI, cartoid artery stenosis, OSA,  CABG x3, CAD, gout, history of back surgery (patient unsure what was done), posterior hip    Patient Stated Goals eliminate pain if possible    Currently in Pain? Yes   Pain Score 4    Pain Location Hip   Pain Orientation Right;Lateral   Pain Type Chronic pain   Pain Radiating Towards no radicular symptoms today   Pain Onset 1 to 4 weeks ago   Pain Frequency Intermittent   Aggravating Factors  getting up out of chair/first standing up   Pain Relieving Factors get off of LE    Effect of Pain on Daily Activities pushes through, just annoying           [facility] Adult PT Treatment/Exercise - 03/26/16 0001      Knee/Hip Exercises: Stretches   Active Hamstring Stretch Both;3 reps;30 seconds   Active Hamstring Stretch Limitations 12in step   Gastroc Stretch Both;3 reps;30 seconds     Knee/Hip Exercises: Standing   Heel Raises Both;1 set;20 reps   Heel Raises Limitations heel and toe    Forward Lunges Both;1 set;15 reps   Forward Lunges Limitations 4 inch box    Functional Squat 10 reps   Functional Squat Limitations cueing for form   Stairs 4in then 7in reciprocal pattern 2RT no HA   SLS Rt 19", Lt 19" max of 3   Other Standing Knee Exercises side step 2Rt with RTB     Manual Therapy   Manual Therapy Soft tissue mobilization   Manual therapy comments performed separately from all other skilled services    Soft tissue mobilization R glutes and mid-distal ITB                   PT Short Term Goals - 03/12/16 1546      PT SHORT TERM GOAL #1   Title Patient to experience a 70% reduction in the frequency and intensity of his pain, when it occurs, in order to show general improvement in condition    Time 2   Period Weeks   Status New     PT SHORT TERM GOAL #2   Title Patient to be able to ambulate and stand unlimited distances/time with no exacerbation in pain in order to show general improvement in condition    Time 2   Period Weeks   Status New     PT SHORT TERM  GOAL #3   Title Patient to be independent in correctly and consistently performing appropriate HEP, to be updated PRN    Time 2   Period Weeks   Status New           PT Long Term Goals -  03/12/16 1551      PT LONG TERM GOAL #1   Title Patient to demonstrate strength 5/5 in all tested muscle groups in order to assist in reduce pain and enhance function    Time 4   Period Weeks   Status New     PT LONG TERM GOAL #2   Title Patient to be unlimited in recreational and exercise based activities of unlimited duration with no increase in pain in order to improve QOL    Time 4   Period Weeks   Status New     PT LONG TERM GOAL #3   Title Patient to be participatory in regular weight bearing aerobic exercise program, at least 20 minutes, at least 4 days per week, in order to maintain functional gains and improve general health status    Time 4   Period Weeks   Status New               Plan - 03/26/16 1530    Clinical Impression Statement Progressed functional strengthening with closed chain exercises.  Added sidestepping and SLS to POC for gluteal strengthening.  Pt able to demonstrate appropriate form and technqiue with therex with minimal cueing required.  Ended session with manual soft tissue mobilization to Rt gluteal and ITB to reduce tenderness and knotting areas with reports of pain reduced at EOS.  SLS added to HEP.   Rehab Potential Good   Clinical Impairments Affecting Rehab Potential chronicity of symptoms   PT Frequency 2x / week   PT Duration 4 weeks   PT Treatment/Interventions ADLs/Self Care Home Management;Biofeedback;Cryotherapy;Moist Heat;DME Instruction;Gait training;Stair training;Functional mobility training;Therapeutic activities;Therapeutic exercise;Balance training;Neuromuscular re-education;Patient/family education;Manual techniques;Scar mobilization;Passive range of motion;Energy conservation;Taping   PT Next Visit Plan  focus on functional strength and  balance moving forward; trial Nustep for strengthening strategie. Continue manual/STM work       Patient will benefit from skilled therapeutic intervention in order to improve the following deficits and impairments:  Abnormal gait, Improper body mechanics, Pain, Decreased coordination, Decreased mobility, Postural dysfunction, Decreased strength, Decreased balance, Difficulty walking, Impaired flexibility  Visit Diagnosis: Pain in right hip  Muscle weakness (generalized)  Difficulty in walking, not elsewhere classified     Problem List Patient Active Problem List   Diagnosis Date Noted  . Achalasia 12/18/2015  . Encounter for therapeutic drug monitoring 05/31/2014  . Atrial fibrillation with RVR ([place]) 05/22/2014  . 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 Abnormal EKG   . NSTEMI (non-ST elevated myocardial infarction) ([place])   . Carotid artery stenosis without cerebral infarction   . Chest pain 02/09/2014  . HTN (hypertension) 02/09/2014  . Carotid stenosis 08/02/2013  . Edema 08/02/2013  . OSA (obstructive sleep apnea) 09/21/2012  . Difficulty in walking(719.7) 01/29/2012  . Balance disorder 01/29/2012  . OA (osteoarthritis) of hip 12/08/2011   Ihor Austin, LPTA; CBIS [phone]  Aldona Lento 03/26/2016, 3:46 PM  [place], [place], [address] Phone: [phone]   Fax:  [phone]  Name: Caleb Wolf MRN: [id number] Date of Birth: [birth year]

## 2016-03-26 NOTE — Patient Instructions (Signed)
SINGLE LIMB STANCE    Stance: single leg on floor. Raise leg. Hold 60 seconds. Repeat with other leg. 3 reps per set, 1-2sets per day, 5 days per week  Copyright  VHI. All rights reserved.

## 2016-03-31 ENCOUNTER — Ambulatory Visit (HOSPITAL_COMMUNITY): Payer: Medicare Other | Admitting: Physical Therapy

## 2016-03-31 DIAGNOSIS — M6281 Muscle weakness (generalized): Secondary | ICD-10-CM

## 2016-03-31 DIAGNOSIS — M25551 Pain in right hip: Secondary | ICD-10-CM | POA: Diagnosis not present

## 2016-03-31 DIAGNOSIS — R262 Difficulty in walking, not elsewhere classified: Secondary | ICD-10-CM | POA: Diagnosis not present

## 2016-03-31 NOTE — Therapy (Signed)
[place], [place], [address] Phone: [phone]   Fax:  [phone]  Physical Therapy Treatment  Patient Details  Name: Caleb Wolf MRN: [id number] Date of Birth: 04-30-1941 Referring Provider: Gerrit Halls PA-C   Encounter Date: 03/31/2016      PT End of Session - 03/31/16 0941    Visit Number 6   Number of Visits 8   Date for PT Re-Evaluation 04/09/16   Authorization Type Medicare    Authorization Time Period 03/12/16 to 04/12/16   Authorization - Visit Number 6   Authorization - Number of Visits 10   PT Start Time 0901   PT Stop Time 0940   PT Time Calculation (min) 39 min   Activity Tolerance Patient tolerated treatment well   Behavior During Therapy [facility] for tasks assessed/performed      Past Medical History:  Diagnosis Date  . Arthritis   . Basal cell carcinoma   . Carotid stenosis    07/04/11- 50-70%bilaterally- stable per Dr Willey Blade note  . Chronic back pain   . Coronary artery disease   . Dysrhythmia   . Enlarged prostate    takes Finasteride daily  . GERD (gastroesophageal reflux disease)   . Gout    takes Indocin daily as needed  . History of colon polyps   . History of kidney stones   . Hyperlipidemia    takes Simvastatin daily  . Hypertension   . Imbalance   . Joint pain   . Lower leg edema   . Lumbar disc disease   . Myocardial infarction    2015  . PONV (postoperative nausea and vomiting)    also hard to urinate after surgery  . Sleep apnea    uses CPAP  . Urinary urgency   . Wears glasses     Past Surgical History:  Procedure Laterality Date  . BACK SURGERY    . CARDIAC CATHETERIZATION  05/22/2014   Procedure: IABP INSERTION;  Surgeon: Leonie Man, MD;  Location: [facility] CATH LAB;  Service: Cardiovascular;;  . cataract surgery Right   . COLONOSCOPY    . CORONARY ARTERY BYPASS GRAFT N/A 05/22/2014   Procedure: CORONARY ARTERY BYPASS GRAFTING (CABG) times three using  left internal mammary and right saphenous vein.;  Surgeon: Melrose Nakayama, MD;  Location: [place];  Service: Open Heart Surgery;  Laterality: N/A;  . ENDARTERECTOMY Left 02/17/2014   Procedure: ENDARTERECTOMY CAROTID WITH PATCH ANGIOPLASTY;  Surgeon: Mal Misty, MD;  Location: [place];  Service: Vascular;  Laterality: Left;  . ESOPHAGEAL DILATION N/A 08/22/2015   Procedure: ESOPHAGEAL DILATION;  Surgeon: Rogene Houston, MD;  [facility];  Service: Endoscopy;  Laterality: N/A;  . ESOPHAGOGASTRODUODENOSCOPY N/A 08/22/2015   Procedure: ESOPHAGOGASTRODUODENOSCOPY (EGD);  Surgeon: Rogene Houston, MD;  [facility];  Service: Endoscopy;  Laterality: N/A;  12:45 - moved to 1:55 - Ann notified pt  . EYE SURGERY     cataract extraction (right) with repair macular tear , with IOL     right  . FRACTURE SURGERY     bilateral wrist fractures- one ORIF  . JOINT REPLACEMENT  2011   left knee  . LEFT HEART CATHETERIZATION WITH CORONARY ANGIOGRAM N/A 05/22/2014   Procedure: LEFT HEART CATHETERIZATION WITH CORONARY ANGIOGRAM;  Surgeon: Leonie Man, MD;  Location: [facility] CATH LAB;  Service: Cardiovascular;  Laterality: N/A;  . LITHOTRIPSY    . PARS PLANA VITRECTOMY W/  REPAIR OF MACULAR HOLE    . RHINOPLASTY    . TONSILLECTOMY    . TOTAL HIP ARTHROPLASTY  12/08/2011   Procedure: TOTAL HIP ARTHROPLASTY;  Surgeon: Gearlean Alf, MD;  [facility];  Service: Orthopedics;  Laterality: Right;  . UPPER GI ENDOSCOPY  12/18/2015   Procedure: UPPER GI ENDOSCOPY;  Surgeon: Ralene Ok, MD;  Location: [facility];  Service: General;;  . WRIST SURGERY Right 46yrs ago  . WRIST SURGERY Left     There were no vitals filed for this visit.      Subjective Assessment - 03/31/16 0906    Subjective Patient arrives today stating he is just not himself, he can't put a finger on it but he just feels "off". He felt OK after last session, just reports difficulty with SLS.  He reports that he got  more relief from massage with the ball rather than manual massage last session.    Pertinent History nitroglycerin PRN, beta-blockers, cardiac stenosis, HTN, NSTEMI, cartoid artery stenosis, OSA, CABG x3, CAD, gout, history of back surgery (patient unsure what was done), posterior hip    Patient Stated Goals eliminate pain if possible    Currently in Pain? Yes   Pain Score 3    Pain Location Hip   Pain Orientation Right;Lateral   Pain Descriptors / Indicators Sore   Pain Type Chronic pain   Pain Radiating Towards none    Pain Onset More than a month ago   Pain Frequency Intermittent   Aggravating Factors  trying to do too much    Pain Relieving Factors get off of LE    Effect of Pain on Daily Activities pushes through, just annoying             [facility] PT Assessment - 03/31/16 0001      Observation/Other Assessments   Observations Pulse 65BPM, O2 96% on room air, BP 170/74 at seated rest per autocuff                      [facility] Adult PT Treatment/Exercise - 03/31/16 0001      Knee/Hip Exercises: Standing   Heel Raises Both;1 set;20 reps   Heel Raises Limitations heel and toe    Forward Lunges Both;1 set;10 reps   Forward Lunges Limitations 2 inch box    Lateral Step Up Both;1 set;10 reps   Lateral Step Up Limitations 6 inch box    Forward Step Up Both;1 set;10 reps   Forward Step Up Limitations 6 inch box      Knee/Hip Exercises: Supine   Bridges 20 reps   Other Supine Knee/Hip Exercises supine hip ABD with red TB 1x10     Knee/Hip Exercises: Prone   Hamstring Curl 1 set;10 reps   Hamstring Curl Limitations 4#    Hip Extension 15 reps     Manual Therapy   Manual Therapy Soft tissue mobilization   Manual therapy comments performed separately from all other skilled services    Soft tissue mobilization R glutes and mid-distal ITB, TFL                 PT Education - 03/31/16 0941    Education provided Yes   Education Details continue to monitor  BP at home over the next couple days; if it remains high in 2-3 days, call PCP    Person(s) Educated Patient   Methods Explanation   Comprehension Verbalized understanding  PT Short Term Goals - 03/12/16 1546      PT SHORT TERM GOAL #1   Title Patient to experience a 70% reduction in the frequency and intensity of his pain, when it occurs, in order to show general improvement in condition    Time 2   Period Weeks   Status New     PT SHORT TERM GOAL #2   Title Patient to be able to ambulate and stand unlimited distances/time with no exacerbation in pain in order to show general improvement in condition    Time 2   Period Weeks   Status New     PT SHORT TERM GOAL #3   Title Patient to be independent in correctly and consistently performing appropriate HEP, to be updated PRN    Time 2   Period Weeks   Status New           PT Long Term Goals - 03/12/16 1551      PT LONG TERM GOAL #1   Title Patient to demonstrate strength 5/5 in all tested muscle groups in order to assist in reduce pain and enhance function    Time 4   Period Weeks   Status New     PT LONG TERM GOAL #2   Title Patient to be unlimited in recreational and exercise based activities of unlimited duration with no increase in pain in order to improve QOL    Time 4   Period Weeks   Status New     PT LONG TERM GOAL #3   Title Patient to be participatory in regular weight bearing aerobic exercise program, at least 20 minutes, at least 4 days per week, in order to maintain functional gains and improve general health status    [facility] [place] - 03/31/16 0942    Clinical Impression Statement Patient arrives today stating he is not quite feeling himself; took vitals with all being [facility] except for BP which as 170/74 per autocuff today- continued with functional strengthening/balance manual with close monitoring of patient status for safety as BP was not high  enough to contraindicate exercise today. Finished session with manual with ball to R glutes/TFL/ITB for continued pain relief and management of symptoms. Pain reduced to 0/10 at end of session.    Rehab Potential Good   Clinical Impairments Affecting Rehab Potential chronicity of symptoms   PT Frequency 2x / week   PT Duration 4 weeks   PT Treatment/Interventions ADLs/Self Care Home Management;Biofeedback;Cryotherapy;Moist Heat;DME Instruction;Gait training;Stair training;Functional mobility training;Therapeutic activities;Therapeutic exercise;Balance training;Neuromuscular re-education;Patient/family education;Manual techniques;Scar mobilization;Passive range of motion;Energy conservation;Taping   PT Next Visit Plan  Check BP! Teach self-manual with tennis ball for R glutes. Focus on functional strength and balance moving forward; trial Nustep for strengthening strategies. Continue manual/STM work    PT Home Exercise Plan 03/12/2016: bridges, SLR, Supine ABD and sit to stand   Consulted and Agree with Plan of Care Patient      Patient will benefit from skilled therapeutic intervention in order to improve the following deficits and impairments:  Abnormal gait, Improper body mechanics, Pain, Decreased coordination, Decreased mobility, Postural dysfunction, Decreased strength, Decreased balance, Difficulty walking, Impaired flexibility  Visit Diagnosis: Pain in right hip  Muscle weakness (generalized)  Difficulty in walking, not elsewhere classified     Problem List Patient Active Problem List   Diagnosis Date Noted  .  Achalasia 12/18/2015  . Encounter for therapeutic drug monitoring 05/31/2014  . Atrial fibrillation with RVR ([place]) 05/22/2014  . 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 Abnormal EKG   . NSTEMI (non-ST elevated myocardial infarction) ([place])   . Carotid artery stenosis without cerebral infarction   . Chest pain 02/09/2014  . HTN (hypertension) 02/09/2014  . Carotid stenosis  08/02/2013  . Edema 08/02/2013  . OSA (obstructive sleep apnea) 09/21/2012  . Difficulty in walking(719.7) 01/29/2012  . Balance disorder 01/29/2012  . OA (osteoarthritis) of hip 12/08/2011    Deniece Ree PT, DPT [place] [address] [place], [place], [address] Phone: [phone]   Fax:  [phone]  Name: Caleb Wolf MRN: [id number] Date of Birth: 07-Apr-1941

## 2016-04-02 ENCOUNTER — Ambulatory Visit (HOSPITAL_COMMUNITY): Payer: Medicare Other | Attending: Physician Assistant | Admitting: Physical Therapy

## 2016-04-02 DIAGNOSIS — M6281 Muscle weakness (generalized): Secondary | ICD-10-CM | POA: Insufficient documentation

## 2016-04-02 DIAGNOSIS — R262 Difficulty in walking, not elsewhere classified: Secondary | ICD-10-CM | POA: Diagnosis not present

## 2016-04-02 DIAGNOSIS — M25551 Pain in right hip: Secondary | ICD-10-CM | POA: Insufficient documentation

## 2016-04-02 NOTE — Therapy (Signed)
[place], [place], [address] Phone: [phone]   Fax:  [phone]  Physical Therapy Treatment  Patient Details  Name: Caleb Wolf MRN: [id number] Date of Birth: [birth year] Referring Provider: Gerrit Halls PA-C   Encounter Date: 04/02/2016      PT End of Session - 04/02/16 1042    Visit Number 7   Number of Visits 8   Date for PT Re-Evaluation 04/09/16   Authorization Type Medicare    Authorization Time Period 03/12/16 to 04/12/16   Authorization - Visit Number 7   Authorization - Number of Visits 10   PT Start Time 0947   PT Stop Time 1027   PT Time Calculation (min) 40 min   Activity Tolerance Patient tolerated treatment well   Behavior During Therapy [facility] for tasks assessed/performed      Past Medical History:  Diagnosis Date  . Arthritis   . Basal cell carcinoma   . Carotid stenosis    07/04/11- 50-70%bilaterally- stable per Dr Willey Blade note  . Chronic back pain   . Coronary artery disease   . Dysrhythmia   . Enlarged prostate    takes Finasteride daily  . GERD (gastroesophageal reflux disease)   . Gout    takes Indocin daily as needed  . History of colon polyps   . History of kidney stones   . Hyperlipidemia    takes Simvastatin daily  . Hypertension   . Imbalance   . Joint pain   . Lower leg edema   . Lumbar disc disease   . Myocardial infarction    2015  . PONV (postoperative nausea and vomiting)    also hard to urinate after surgery  . Sleep apnea    uses CPAP  . Urinary urgency   . Wears glasses     Past Surgical History:  Procedure Laterality Date  . BACK SURGERY    . CARDIAC CATHETERIZATION  05/22/2014   Procedure: IABP INSERTION;  Surgeon: Leonie Man, MD;  Location: [facility] CATH LAB;  Service: Cardiovascular;;  . cataract surgery Right   . COLONOSCOPY    . CORONARY ARTERY BYPASS GRAFT N/A 05/22/2014   Procedure: CORONARY ARTERY BYPASS GRAFTING (CABG) times three using  left internal mammary and right saphenous vein.;  Surgeon: Melrose Nakayama, MD;  Location: [place];  Service: Open Heart Surgery;  Laterality: N/A;  . ENDARTERECTOMY Left 02/17/2014   Procedure: ENDARTERECTOMY CAROTID WITH PATCH ANGIOPLASTY;  Surgeon: Mal Misty, MD;  Location: [place];  Service: Vascular;  Laterality: Left;  . ESOPHAGEAL DILATION N/A 08/22/2015   Procedure: ESOPHAGEAL DILATION;  Surgeon: Rogene Houston, MD;  [facility];  Service: Endoscopy;  Laterality: N/A;  . ESOPHAGOGASTRODUODENOSCOPY N/A 08/22/2015   Procedure: ESOPHAGOGASTRODUODENOSCOPY (EGD);  Surgeon: Rogene Houston, MD;  [facility];  Service: Endoscopy;  Laterality: N/A;  12:45 - moved to 1:55 - Ann notified pt  . EYE SURGERY     cataract extraction (right) with repair macular tear , with IOL     right  . FRACTURE SURGERY     bilateral wrist fractures- one ORIF  . JOINT REPLACEMENT  2011   left knee  . LEFT HEART CATHETERIZATION WITH CORONARY ANGIOGRAM N/A 05/22/2014   Procedure: LEFT HEART CATHETERIZATION WITH CORONARY ANGIOGRAM;  Surgeon: Leonie Man, MD;  Location: [facility] CATH LAB;  Service: Cardiovascular;  Laterality: N/A;  . LITHOTRIPSY    . PARS PLANA VITRECTOMY W/  REPAIR OF MACULAR HOLE    . RHINOPLASTY    . TONSILLECTOMY    . TOTAL HIP ARTHROPLASTY  12/08/2011   Procedure: TOTAL HIP ARTHROPLASTY;  Surgeon: Gearlean Alf, MD;  [facility];  Service: Orthopedics;  Laterality: Right;  . UPPER GI ENDOSCOPY  12/18/2015   Procedure: UPPER GI ENDOSCOPY;  Surgeon: Ralene Ok, MD;  [facility];  Service: General;;  . WRIST SURGERY Right 60yrs ago  . WRIST SURGERY Left     There were no vitals filed for this visit.      Subjective Assessment - 04/02/16 0949    Subjective Patient arrives today stating he is feeling better than last session, his BP has continued to run high at home however. He feels that the massage felt good last session but he is a little sore.     Pertinent History nitroglycerin PRN, beta-blockers, cardiac stenosis, HTN, NSTEMI, cartoid artery stenosis, OSA, CABG x3, CAD, gout, history of back surgery (patient unsure what was done), posterior hip    Patient Stated Goals eliminate pain if possible    Currently in Pain? No/denies            [facility] PT Assessment - 04/02/16 0001      Observation/Other Assessments   Observations Pulse 55BPM, O2 95% on RA, BP 167/72 at seated rest                      [facility] Adult PT Treatment/Exercise - 04/02/16 0001      Knee/Hip Exercises: Standing   Heel Raises Both;1 set;20 reps   Heel Raises Limitations heel and toe    Forward Lunges Both;1 set;15 reps   Forward Lunges Limitations 2 inch box    Side Lunges Both;1 set;10 reps   Side Lunges Limitations 4 inch box    Lateral Step Up Both;1 set;15 reps   Lateral Step Up Limitations 6 inch box    Forward Step Up Both;1 set;15 reps   Forward Step Up Limitations 6 inch box      Manual Therapy   Manual Therapy Soft tissue mobilization   Manual therapy comments performed separately from all other skilled services    Soft tissue mobilization R glutes and mid-distal ITB, TFL              Balance Exercises - 04/02/16 1038      Balance Exercises: Standing   Tandem Stance Eyes open;3 reps;15 secs   SLS Eyes open;Solid surface;3 reps  foot on half bolster    Other Standing Exercises standing hip flexion with 1 second hold 2x10           PT Education - 04/02/16 1041    Education provided Yes   Education Details continue to monitor BP at home, speak to MD/PCP if it continues to stay high    Person(s) Educated Patient   Methods Explanation   Comprehension Verbalized understanding          PT Short Term Goals - 03/12/16 1546      PT SHORT TERM GOAL #1   Title Patient to experience a 70% reduction in the frequency and intensity of his pain, when it occurs, in order to show general improvement in condition    Time 2    Period Weeks   Status New     PT SHORT TERM GOAL #2   Title Patient to be able to ambulate and stand unlimited distances/time with no exacerbation in pain in order  to show general improvement in condition    Time 2   Period Weeks   Status New     PT SHORT TERM GOAL #3   Title Patient to be independent in correctly and consistently performing appropriate HEP, to be updated PRN    Time 2   Period Weeks   Status New           PT Long Term Goals - 03/12/16 1551      PT LONG TERM GOAL #1   Title Patient to demonstrate strength 5/5 in all tested muscle groups in order to assist in reduce pain and enhance function    Time 4   Period Weeks   Status New     PT LONG TERM GOAL #2   Title Patient to be unlimited in recreational and exercise based activities of unlimited duration with no increase in pain in order to improve QOL    Time 4   Period Weeks   Status New     PT LONG TERM GOAL #3   Title Patient to be participatory in regular weight bearing aerobic exercise program, at least 20 minutes, at least 4 days per week, in order to maintain functional gains and improve general health status    Time 4   Period Weeks   Status New               Plan - 04/02/16 1042    Clinical Impression Statement Patient arrives continuing to display high BP; educated him to continue to monitor at home and speak to MD/PCP if this does not return to normal values within the next couple of days. Continued with functional strengthening with progression of reps/difficulty of exercises as well as progression of balance work today. Finished session with manual to R glutes/TFL/ITB with noted reduction in knotting/muscle spasm at end of manual.    Rehab Potential Good   Clinical Impairments Affecting Rehab Potential chronicity of symptoms   PT Frequency 2x / week   PT Duration 4 weeks   PT Treatment/Interventions ADLs/Self Care Home Management;Biofeedback;Cryotherapy;Moist Heat;DME  Instruction;Gait training;Stair training;Functional mobility training;Therapeutic activities;Therapeutic exercise;Balance training;Neuromuscular re-education;Patient/family education;Manual techniques;Scar mobilization;Passive range of motion;Energy conservation;Taping   PT Next Visit Plan  Re-assess, teach self-manaual to glutes with tennis ball. Trial Nustep, continue STM and continue to progress ther ex/balance. Update HEP.    PT Home Exercise Plan 03/12/2016: bridges, SLR, Supine ABD and sit to stand   Consulted and Agree with Plan of Care Patient      Patient will benefit from skilled therapeutic intervention in order to improve the following deficits and impairments:  Abnormal gait, Improper body mechanics, Pain, Decreased coordination, Decreased mobility, Postural dysfunction, Decreased strength, Decreased balance, Difficulty walking, Impaired flexibility  Visit Diagnosis: Pain in right hip  Muscle weakness (generalized)  Difficulty in walking, not elsewhere classified     Problem List Patient Active Problem List   Diagnosis Date Noted  . Achalasia 12/18/2015  . Encounter for therapeutic drug monitoring 05/31/2014  . Atrial fibrillation with RVR ([place]) 05/22/2014  . 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 Abnormal EKG   . NSTEMI (non-ST elevated myocardial infarction) ([place])   . Carotid artery stenosis without cerebral infarction   . Chest pain 02/09/2014  . HTN (hypertension) 02/09/2014  . Carotid stenosis 08/02/2013  . Edema 08/02/2013  . OSA (obstructive sleep apnea) 09/21/2012  . Difficulty in walking(719.7) 01/29/2012  . Balance disorder 01/29/2012  . OA (osteoarthritis) of hip 12/08/2011    Cyril Mourning  Hanley Hays, DPT [phone]  [facility] [address] [place], [place], [address] Phone: [phone]   Fax:  [phone]  Name: Caleb Wolf MRN: [id number] Date of Birth: 08/19/1940

## 2016-04-07 ENCOUNTER — Ambulatory Visit (HOSPITAL_COMMUNITY): Payer: Medicare Other | Admitting: Physical Therapy

## 2016-04-07 DIAGNOSIS — M25551 Pain in right hip: Secondary | ICD-10-CM

## 2016-04-07 DIAGNOSIS — M6281 Muscle weakness (generalized): Secondary | ICD-10-CM

## 2016-04-07 DIAGNOSIS — R262 Difficulty in walking, not elsewhere classified: Secondary | ICD-10-CM | POA: Diagnosis not present

## 2016-04-07 NOTE — Therapy (Signed)
[facility] [address] [place], [place], [address] Phone: [phone]   Fax:  [phone]  Physical Therapy Treatment  Patient Details  Name: Caleb Wolf MRN: [id number] Date of Birth: August 19, 1940 Referring Provider: Idolina Primer   Encounter Date: 04/07/2016      PT End of Session - 04/07/16 1420    Visit Number 8   Number of Visits 8   Date for PT Re-Evaluation 04/09/16   Authorization Type Medicare    Authorization Time Period 03/12/16 to 04/12/16   Authorization - Visit Number 8   Authorization - Number of Visits 8   PT Start Time 3329   PT Stop Time 1421   PT Time Calculation (min) 36 min   Activity Tolerance Patient tolerated treatment well   Behavior During Therapy [facility] for tasks assessed/performed      Past Medical History:  Diagnosis Date  . Arthritis   . Basal cell carcinoma   . Carotid stenosis    07/04/11- 50-70%bilaterally- stable per Dr Willey Blade note  . Chronic back pain   . Coronary artery disease   . Dysrhythmia   . Enlarged prostate    takes Finasteride daily  . GERD (gastroesophageal reflux disease)   . Gout    takes Indocin daily as needed  . History of colon polyps   . History of kidney stones   . Hyperlipidemia    takes Simvastatin daily  . Hypertension   . Imbalance   . Joint pain   . Lower leg edema   . Lumbar disc disease   . Myocardial infarction    2015  . PONV (postoperative nausea and vomiting)    also hard to urinate after surgery  . Sleep apnea    uses CPAP  . Urinary urgency   . Wears glasses     Past Surgical History:  Procedure Laterality Date  . BACK SURGERY    . CARDIAC CATHETERIZATION  05/22/2014   Procedure: IABP INSERTION;  Surgeon: Leonie Man, MD;  Location: [facility] CATH LAB;  Service: Cardiovascular;;  . cataract surgery Right   . COLONOSCOPY    . CORONARY ARTERY BYPASS GRAFT N/A 05/22/2014   Procedure: CORONARY ARTERY BYPASS GRAFTING (CABG) times three using left  internal mammary and right saphenous vein.;  Surgeon: Melrose Nakayama, MD;  Location: [place];  Service: Open Heart Surgery;  Laterality: N/A;  . ENDARTERECTOMY Left 02/17/2014   Procedure: ENDARTERECTOMY CAROTID WITH PATCH ANGIOPLASTY;  Surgeon: Mal Misty, MD;  Location: [place];  Service: Vascular;  Laterality: Left;  . ESOPHAGEAL DILATION N/A 08/22/2015   Procedure: ESOPHAGEAL DILATION;  Surgeon: Rogene Houston, MD;  [facility];  Service: Endoscopy;  Laterality: N/A;  . ESOPHAGOGASTRODUODENOSCOPY N/A 08/22/2015   Procedure: ESOPHAGOGASTRODUODENOSCOPY (EGD);  Surgeon: Rogene Houston, MD;  [facility];  Service: Endoscopy;  Laterality: N/A;  12:45 - moved to 1:55 - Ann notified pt  . EYE SURGERY     cataract extraction (right) with repair macular tear , with IOL     right  . FRACTURE SURGERY     bilateral wrist fractures- one ORIF  . JOINT REPLACEMENT  2011   left knee  . LEFT HEART CATHETERIZATION WITH CORONARY ANGIOGRAM N/A 05/22/2014   Procedure: LEFT HEART CATHETERIZATION WITH CORONARY ANGIOGRAM;  Surgeon: Leonie Man, MD;  Location: [facility] CATH LAB;  Service: Cardiovascular;  Laterality: N/A;  . LITHOTRIPSY    . PARS PLANA VITRECTOMY W/ REPAIR  OF MACULAR HOLE    . RHINOPLASTY    . TONSILLECTOMY    . TOTAL HIP ARTHROPLASTY  12/08/2011   Procedure: TOTAL HIP ARTHROPLASTY;  Surgeon: Gearlean Alf, MD;  [facility];  Service: Orthopedics;  Laterality: Right;  . UPPER GI ENDOSCOPY  12/18/2015   Procedure: UPPER GI ENDOSCOPY;  Surgeon: Ralene Ok, MD;  [facility];  Service: General;;  . WRIST SURGERY Right 32yr ago  . WRIST SURGERY Left     There were no vitals filed for this visit.      Subjective Assessment - 04/07/16 1340    Subjective Pt states that 99% of the time he is doing well.     Pertinent History nitroglycerin PRN, beta-blockers, cardiac stenosis, HTN, NSTEMI, cartoid artery stenosis, OSA, CABG x3, CAD, gout, history of back  surgery (patient unsure what was done), posterior hip    How long can you sit comfortably? unlimited    How long can you stand comfortably? was 15-20 minutes now 30 minutes    How long can you walk comfortably? walking without an assistive device 25 to 30 minutes    Patient Stated Goals eliminate pain if possible    Currently in Pain? No/denies            [facility] - 04/07/16 0001      Assessment   Medical Diagnosis R hip pain    Referring Provider SIdolina Primer   Onset Date/Surgical Date --  exacerbation started about 2 weeks ago    Next MD Visit 04/17/2016     Precautions   Precaution Comments R posterior hip done in 2013      Balance Screen   Has the patient fallen in the past 6 months No   Has the patient had a decrease in activity level because of a fear of falling?  No   Is the patient reluctant to leave their home because of a fear of falling?  No     Prior Function   Level of Independence Independent;Independent with basic ADLs;Independent with gait;Independent with transfers   Vocation Retired     Strength   Right Hip Flexion 5/5  was 3/5   Right Hip Extension 4+/5  was 2+/5   Right Hip ABduction 5/5  was 3/5    Left Hip Flexion 5/5  was 3+/5   Left Hip Extension 3+/5  was 3/5    Left Hip ABduction 4/5  was 3/5    Right Knee Flexion 5/5  was 4+/5   Right Knee Extension 5/5   Left Knee Flexion 5/5  was 4+/5   Left Knee Extension 5/5  was 4+/5    Right Ankle Dorsiflexion 5/5   Left Ankle Dorsiflexion 5/5     Palpation   Palpation comment  no tenderness to palpation noted lateral R hip/area of TFL     6 minute walk test results    Aerobic Endurance Distance Walked 698  was 6747f   Endurance additional comments 3MWT      High Level Balance   High Level Balance Comments SLS 30 seconds B LEs however intermittent UE touching                              PT Education - 04/07/16 1419    Education provided Yes    Education Details The importance of walking, balance exercises and gluteal strengthening    Person(s) Educated  Patient   Methods Explanation   Comprehension Verbalized understanding;Returned demonstration          PT Short Term Goals - 05-04-16 1359      PT SHORT TERM GOAL #1   Title Patient to experience a 70% reduction in the frequency and intensity of his pain, when it occurs, in order to show general improvement in condition    Time 2   Period Weeks   Status Achieved     PT SHORT TERM GOAL #2   Title Patient to be able to ambulate and stand unlimited distances/time with no exacerbation in pain in order to show general improvement in condition    Time 2   Period Weeks   Status Achieved     PT SHORT TERM GOAL #3   Title Patient to be independent in correctly and consistently performing appropriate HEP, to be updated PRN    Time 2   Period Weeks   Status Partially Met           PT Long Term Goals - May 04, 2016 1400      PT LONG TERM GOAL #1   Title Patient to demonstrate strength 5/5 in all tested muscle groups in order to assist in reduce pain and enhance function    Baseline except for gluteal maximus Bilaterally    Time 4   Period Weeks   Status Achieved     PT LONG TERM GOAL #2   Title Patient to be unlimited in recreational and exercise based activities of unlimited duration with no increase in pain in order to improve QOL    Time 4   Period Weeks   Status Achieved     PT LONG TERM GOAL #3   Title Patient to be participatory in regular weight bearing aerobic exercise program, at least 20 minutes, at least 4 days per week, in order to maintain functional gains and improve general health status    Time 4   Period Weeks   Status Not Met               Plan - 2016/05/04 1421    Clinical Impression Statement Pt reassessed with noted improvement in mm strength and pain.  Pt still has balance limitations, weakened gluteus maximus bilateral and decreased  balance but will be able to work on these deficits on his own at home at this time.    Rehab Potential Good   Clinical Impairments Affecting Rehab Potential chronicity of symptoms   PT Frequency 2x / week   PT Duration 4 weeks   PT Treatment/Interventions ADLs/Self Care Home Management;Biofeedback;Cryotherapy;Moist Heat;DME Instruction;Gait training;Stair training;Functional mobility training;Therapeutic activities;Therapeutic exercise;Balance training;Neuromuscular re-education;Patient/family education;Manual techniques;Scar mobilization;Passive range of motion;Energy conservation;Taping   PT Next Visit Plan discharge pt.    PT Home Exercise Plan 03/12/2016: bridges, SLR, Supine ABD and sit to stand   Consulted and Agree with Plan of Care Patient      Patient will benefit from skilled therapeutic intervention in order to improve the following deficits and impairments:  Abnormal gait, Improper body mechanics, Pain, Decreased coordination, Decreased mobility, Postural dysfunction, Decreased strength, Decreased balance, Difficulty walking, Impaired flexibility  Visit Diagnosis: Pain in right hip  Muscle weakness (generalized)       G-Codes - 2016/05/04 1423    Functional Assessment Tool Used clinical assessment of strength, gait and balance   Functional Limitation Mobility: Walking and moving around   Mobility: Walking and Moving Around Goal Status [phone]) At least 1 percent but less  than 20 percent impaired, limited or restricted   Mobility: Walking and Moving Around Discharge Status [phone]) At least 1 percent but less than 20 percent impaired, limited or restricted      Problem List Patient Active Problem List   Diagnosis Date Noted  . Achalasia 12/18/2015  . Encounter for therapeutic drug monitoring 05/31/2014  . Atrial fibrillation with RVR ([place]) 05/22/2014  . 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 Abnormal EKG   . NSTEMI (non-ST elevated myocardial infarction) ([place])   . Carotid artery  stenosis without cerebral infarction   . Chest pain 02/09/2014  . HTN (hypertension) 02/09/2014  . Carotid stenosis 08/02/2013  . Edema 08/02/2013  . OSA (obstructive sleep apnea) 09/21/2012  . Difficulty in walking(719.7) 01/29/2012  . Balance disorder 01/29/2012  . OA (osteoarthritis) of hip 12/08/2011    Rayetta Humphrey, PT CLT [phone] 04/07/2016, 2:24 PM  [place], [place], [address] Phone: [phone]   Fax:  [phone]  Name: MISHAEL HARAN MRN: [id number] Date of Birth: 1940/10/21   PHYSICAL THERAPY DISCHARGE SUMMARY  Visits from Start of Care: 8  Current functional level related to goals / functional outcomes: See above   Remaining deficits: See above   Education / Equipment: HEP Plan: Patient agrees to discharge.  Patient goals were partially met. Patient is being discharged due to being pleased with the current functional level.  ?????        Rayetta Humphrey, [place] CLT [phone]

## 2016-04-09 ENCOUNTER — Encounter (HOSPITAL_COMMUNITY): Payer: Medicare Other | Admitting: Physical Therapy

## 2016-04-10 ENCOUNTER — Encounter (HOSPITAL_COMMUNITY): Payer: Medicare Other | Admitting: Physical Therapy

## 2016-04-14 ENCOUNTER — Encounter (HOSPITAL_COMMUNITY): Payer: Medicare Other | Admitting: Physical Therapy

## 2016-04-16 ENCOUNTER — Encounter (HOSPITAL_COMMUNITY): Payer: Medicare Other | Admitting: Physical Therapy

## 2016-04-21 ENCOUNTER — Encounter (HOSPITAL_COMMUNITY): Payer: Medicare Other | Admitting: Physical Therapy

## 2016-04-22 NOTE — Addendum Note (Signed)
Addended by: Lianne Cure A on: 04/22/2016 01:46 PM   Modules accepted: Orders

## 2016-05-05 ENCOUNTER — Ambulatory Visit (INDEPENDENT_AMBULATORY_CARE_PROVIDER_SITE_OTHER): Payer: Medicare Other | Admitting: Internal Medicine

## 2016-05-05 ENCOUNTER — Encounter: Payer: Self-pay | Admitting: Internal Medicine

## 2016-05-05 VITALS — BP 116/76 | HR 65 | Ht 70.0 in | Wt 244.0 lb

## 2016-05-05 DIAGNOSIS — G4733 Obstructive sleep apnea (adult) (pediatric): Secondary | ICD-10-CM | POA: Diagnosis not present

## 2016-05-05 DIAGNOSIS — I6522 Occlusion and stenosis of left carotid artery: Secondary | ICD-10-CM | POA: Diagnosis not present

## 2016-05-05 NOTE — Patient Instructions (Addendum)
Order- DME [facility]- please evaluate eligibility for replacement of old CPAP machine- change to auto 5-15, mask of choice, humidifier, supplies, AirView dx OSA                 Patient would like to learn about alternative mask styles/ chin strap if needed,                     And about availability of small travel CPAP machines  Consider trying otc nasal saline gel as discussed  Please call if we can help

## 2016-05-05 NOTE — Progress Notes (Signed)
Subjective:    Patient ID: Caleb Wolf, male    DOB: 15-Mar-1941, 75 y.o.   MRN: [id number]  HPI   Male former smoker followed for OSA, complicated by HBP, CAD/MI/CABG, carotid stenosis, osteoarthritis hip, GERD  ---------------------------------------------------   05/03/2015-75 year old male followed for OSA, complicated by HBP, CAD/history MI/CABGI, carotid stenosis, osteoarthritis hip Former pt of Ord; DME is Wears CPAP every night for about 6-9 hours.  NPSG 07/2012  AHI 15/ hr, CPAP to 10 CPAP / 10   / [facility] Download indicates adequate compliance with residual AHI 7.9. There is room to do a little better as discussed. Watching comfort issues with mask and pressure.  05/05/2016-75 year old male former smoker followed for OSA, complicated by HBP, CAD/MI/CABG/, carotid stenosis, osteoarthritis hip, GERD CPAP 11/[name] FOLLOWS FOR: DME:[facility]. DL attached. No new supplies needed at this time. Pt would like to have order placed for new CPAP machine-believes he has had this CPAP for over 5-7 years. Pt wears CPAP nightly Feeling much better on CPAP. Mask occasionally bothersome-full face. Download-good compliance 83%/4 hours, AHI 9.0 with central apneas. CXR 12/12/2015 IMPRESSION: No infiltrate or pulmonary edema. Small right pleural effusion with blunting of right costophrenic angle.  ROS-see HPI   Negative unless "+" Constitutional:    weight loss, night sweats, fevers, chills, fatigue, lassitude. HEENT:    headaches, difficulty swallowing, tooth/dental problems, sore throat,       sneezing, itching, ear ache, nasal congestion, post nasal drip, snoring CV:    chest pain, orthopnea, PND, swelling in lower extremities, anasarca,                                                 dizziness, palpitations Resp:   shortness of breath with exertion or at rest.                productive cough,   non-productive cough, coughing up of blood.              change in color  of mucus.  wheezing.   Skin:    rash or lesions. GI:  No-   heartburn, indigestion, abdominal pain, nausea, vomiting, diarrhea,                 change in bowel habits, loss of appetite GU: dysuria, change in color of urine, no urgency or frequency.   flank pain. MS:   joint pain, stiffness, decreased range of motion, back pain. Neuro-     nothing unusual Psych:  change in mood or affect.  depression or anxiety.   memory loss.    Objective:  OBJ- Physical Exam General- Alert, Oriented, Affect-appropriate, Distress- none acute, + overweight Skin- rash-none, lesions- none, excoriation- none Lymphadenopathy- none Head- atraumatic            Eyes- Gross vision intact, PERRLA, conjunctivae and secretions clear            Ears- Hearing, canals-normal            Nose- Clear, no-Septal dev, mucus, polyps, erosion, perforation             Throat- Mallampati III-IV, mucosa clear , drainage- none, tonsils- atrophic Neck- flexible , trachea midline, no stridor , thyroid nl, carotid no bruit Chest - symmetrical excursion , unlabored           Heart/CV-  RRR , no murmur , no gallop  , no rub, nl s1 s2                           - JVD- none , edema- none, stasis changes- none, varices- none           Lung- clear to P&A, wheeze- none, cough- none , dullness-none, rub- none           Chest wall-  Abd-  Br/ Gen/ Rectal- Not done, not indicated Extrem- cyanosis- none, clubbing, none, atrophy- none, strength- nl Neuro- grossly intact to observation    Assessment & Plan:

## 2016-05-12 NOTE — Assessment & Plan Note (Signed)
Quality of life clearly better on CPAP-better rested, no snoring. Compliance is good. Download indicates residual AHI 9.0. His machine is getting old. We will take the opportunity to replace with her machine if eligible, also changing pressure setting to auto 5-15 and reassessing mask fit. Encouraged normal weight.

## 2016-05-15 ENCOUNTER — Other Ambulatory Visit: Payer: Self-pay | Admitting: Cardiovascular Disease

## 2016-05-15 ENCOUNTER — Encounter: Payer: Self-pay | Admitting: Internal Medicine

## 2016-05-15 ENCOUNTER — Encounter: Payer: Self-pay | Admitting: Cardiovascular Disease

## 2016-05-15 ENCOUNTER — Ambulatory Visit (INDEPENDENT_AMBULATORY_CARE_PROVIDER_SITE_OTHER): Payer: Medicare Other | Admitting: Cardiovascular Disease

## 2016-05-15 VITALS — BP 110/64 | HR 65 | Ht 69.5 in | Wt 237.0 lb

## 2016-05-15 DIAGNOSIS — I10 Essential (primary) hypertension: Secondary | ICD-10-CM | POA: Diagnosis not present

## 2016-05-15 DIAGNOSIS — I209 Angina pectoris, unspecified: Secondary | ICD-10-CM

## 2016-05-15 DIAGNOSIS — E7800 Pure hypercholesterolemia, unspecified: Secondary | ICD-10-CM

## 2016-05-15 DIAGNOSIS — I6522 Occlusion and stenosis of left carotid artery: Secondary | ICD-10-CM

## 2016-05-15 DIAGNOSIS — I480 Paroxysmal atrial fibrillation: Secondary | ICD-10-CM

## 2016-05-15 DIAGNOSIS — Z951 Presence of aortocoronary bypass graft: Secondary | ICD-10-CM

## 2016-05-15 DIAGNOSIS — I25708 Atherosclerosis of coronary artery bypass graft(s), unspecified, with other forms of angina pectoris: Secondary | ICD-10-CM

## 2016-05-15 DIAGNOSIS — R600 Localized edema: Secondary | ICD-10-CM

## 2016-05-15 MED ORDER — POTASSIUM CHLORIDE CRYS ER 20 MEQ PO TBCR: 20.0000 meq | EXTENDED_RELEASE_TABLET | ORAL | 3 refills | Status: DC

## 2016-05-15 MED ORDER — LISINOPRIL 5 MG PO TABS: 5.0000 mg | ORAL_TABLET | Freq: Every day | ORAL | 2 refills | Status: DC

## 2016-05-15 MED ORDER — FUROSEMIDE 40 MG PO TABS: 40.0000 mg | ORAL_TABLET | ORAL | 3 refills | Status: DC

## 2016-05-15 MED ORDER — DOXAZOSIN MESYLATE 2 MG PO TABS: 2.0000 mg | ORAL_TABLET | Freq: Every day | ORAL | 3 refills | Status: DC

## 2016-05-15 MED ORDER — ATORVASTATIN CALCIUM 40 MG PO TABS: 40.0000 mg | ORAL_TABLET | Freq: Every evening | ORAL | 3 refills | Status: DC

## 2016-05-15 MED ORDER — METOPROLOL TARTRATE 25 MG PO TABS: 12.5000 mg | ORAL_TABLET | Freq: Every day | ORAL | 3 refills | Status: DC

## 2016-05-15 MED ORDER — NITROGLYCERIN 0.4 MG SL SUBL: 0.4000 mg | SUBLINGUAL_TABLET | SUBLINGUAL | 3 refills | Status: DC | PRN

## 2016-05-15 NOTE — Progress Notes (Signed)
SUBJECTIVE: Patient presents for follow-up of coronary artery disease with a history of CABG, chronic systolic heart failure, atrial fibrillation, and peripheral vascular disease.  ECG today shows sinus bradycardia with old anteroseptal infarct, heart rate 52 bpm.  He denies exertional chest tightness. He does have some exertional dyspnea but denies dizziness. He has bilateral leg swelling left greater than right.   They are going on a two-week trip to [place] in January 2018.   Review of Systems: As per "subjective", otherwise negative.  Allergies  Allergen Reactions  . Codeine Sulfate Nausea Only    Current Outpatient Prescriptions  Medication Sig Dispense Refill  . aspirin 81 MG tablet Take 1 tablet (81 mg total) by mouth daily. 30 tablet   . atorvastatin (LIPITOR) 40 MG tablet Take 1 tablet (40 mg total) by mouth daily. (Patient taking differently: Take 40 mg by mouth every evening. ) 30 tablet 1  . colchicine 0.6 MG tablet Take 0.6 mg by mouth as needed (gout).     Marland Kitchen doxazosin (CARDURA) 2 MG tablet Take 2 mg by mouth daily.    . finasteride (PROSCAR) 5 MG tablet Take 5 mg by mouth at bedtime.     . furosemide (LASIX) 40 MG tablet Take 1 tablet (40 mg total) by mouth daily. (Patient taking differently: Take 40 mg by mouth as directed. 2 times per wk.) 30 tablet 6  . lisinopril (PRINIVIL,ZESTRIL) 5 MG tablet Take 1 tablet (5 mg total) by mouth daily. 90 tablet 2  . metoprolol tartrate (LOPRESSOR) 25 MG tablet Take 0.5 tablets (12.5 mg total) by mouth 2 (two) times daily. 90 tablet 0  . nitroGLYCERIN (NITROSTAT) 0.4 MG SL tablet Place 1 tablet (0.4 mg total) under the tongue every 5 (five) minutes as needed for chest pain. 25 tablet 3  . pantoprazole (PROTONIX) 40 MG tablet Take 1 tablet (40 mg total) by mouth daily before breakfast. (Patient taking differently: Take 40 mg by mouth at bedtime. ) 90 tablet 3  . potassium chloride SA (K-DUR,KLOR-CON) 20 MEQ tablet Take 1 tablet  (20 mEq total) by mouth daily. (Patient taking differently: Take 20 mEq by mouth as directed. 2 times per wk.) 30 tablet 6   No current facility-administered medications for this visit.     Past Medical History:  Diagnosis Date  . Arthritis   . Basal cell carcinoma   . Carotid stenosis    07/04/11- 50-70%bilaterally- stable per Dr Willey Blade note  . Chronic back pain   . Coronary artery disease   . Dysrhythmia   . Enlarged prostate    takes Finasteride daily  . GERD (gastroesophageal reflux disease)   . Gout    takes Indocin daily as needed  . History of colon polyps   . History of kidney stones   . Hyperlipidemia    takes Simvastatin daily  . Hypertension   . Imbalance   . Joint pain   . Lower leg edema   . Lumbar disc disease   . Myocardial infarction    2015  . PONV (postoperative nausea and vomiting)    also hard to urinate after surgery  . Sleep apnea    uses CPAP  . Urinary urgency   . Wears glasses     Past Surgical History:  Procedure Laterality Date  . BACK SURGERY    . CARDIAC CATHETERIZATION  05/22/2014   Procedure: IABP INSERTION;  Surgeon: Leonie Man, MD;  Location: [facility] CATH LAB;  Service: Cardiovascular;;  .  cataract surgery Right   . COLONOSCOPY    . CORONARY ARTERY BYPASS GRAFT N/A 05/22/2014   Procedure: CORONARY ARTERY BYPASS GRAFTING (CABG) times three using left internal mammary and right saphenous vein.;  Surgeon: Melrose Nakayama, MD;  Location: [place];  Service: Open Heart Surgery;  Laterality: N/A;  . ENDARTERECTOMY Left 02/17/2014   Procedure: ENDARTERECTOMY CAROTID WITH PATCH ANGIOPLASTY;  Surgeon: Mal Misty, MD;  Location: [place];  Service: Vascular;  Laterality: Left;  . ESOPHAGEAL DILATION N/A 08/22/2015   Procedure: ESOPHAGEAL DILATION;  Surgeon: Rogene Houston, MD;  [facility];  Service: Endoscopy;  Laterality: N/A;  . ESOPHAGOGASTRODUODENOSCOPY N/A 08/22/2015   Procedure: ESOPHAGOGASTRODUODENOSCOPY (EGD);  Surgeon:  Rogene Houston, MD;  [facility];  Service: Endoscopy;  Laterality: N/A;  12:45 - moved to 1:55 - Ann notified pt  . EYE SURGERY     cataract extraction (right) with repair macular tear , with IOL     right  . FRACTURE SURGERY     bilateral wrist fractures- one ORIF  . JOINT REPLACEMENT  2011   left knee  . LEFT HEART CATHETERIZATION WITH CORONARY ANGIOGRAM N/A 05/22/2014   Procedure: LEFT HEART CATHETERIZATION WITH CORONARY ANGIOGRAM;  Surgeon: Leonie Man, MD;  Location: [facility] CATH LAB;  Service: Cardiovascular;  Laterality: N/A;  . LITHOTRIPSY    . PARS PLANA VITRECTOMY W/ REPAIR OF MACULAR HOLE    . RHINOPLASTY    . TONSILLECTOMY    . TOTAL HIP ARTHROPLASTY  12/08/2011   Procedure: TOTAL HIP ARTHROPLASTY;  Surgeon: Gearlean Alf, MD;  [facility];  Service: Orthopedics;  Laterality: Right;  . UPPER GI ENDOSCOPY  12/18/2015   Procedure: UPPER GI ENDOSCOPY;  Surgeon: Ralene Ok, MD;  [facility];  Service: General;;  . WRIST SURGERY Right 60yrs ago  . WRIST SURGERY Left     Social History   Social History  . Marital status: Married    Spouse name: N/A  . Number of children: N/A  . Years of education: N/A   Occupational History  . retired     [employer]   Social History Main Topics  . Smoking status: Former Smoker    Packs/day: 1.50    Years: 25.00    Types: Cigarettes    Start date: 08/08/1960    Quit date: 06/02/1982  . Smokeless tobacco: Never Used     Comment: quit smoking 30+yrs ago  . Alcohol use 0.0 oz/week     Comment: occasionally   . Drug use: No  . Sexual activity: Yes   Other Topics Concern  . Not on file   Social History Narrative  . No narrative on file     Vitals:   05/15/16 1010  BP: 110/64  Pulse: 65  SpO2: 94%  Weight: 237 lb (107.5 kg)  Height: 5' 9.5" (1.765 m)    PHYSICAL EXAM General: NAD HEENT: Normal. Neck: No JVD, no thyromegaly. Lungs: Clear to auscultation bilaterally with normal  respiratory effort. CV: Nondisplaced PMI. Bradycardic, regular rhythm, normal S1/S2, no S3/S4, no murmur. Trace pretibial and periankle edema.  Abdomen: Soft, obese, no distention.  Neurologic: Alert and oriented x 3.  Psych: Normal affect. Skin: Normal. Musculoskeletal: No gross deformities. Extremities: No clubbing or cyanosis.     ECG: Most recent ECG reviewed.      ASSESSMENT AND PLAN: 1. CAD with CABG: Symptomatically stable. Continue therapy with aspirin 81 mg, metoprolol (reduce to 12.5  mg q pm), ACEI, and Lipitor 40 mg.   2. Essential HTN: Well controlled on lisinopril 5 mg daily. No changes.  3. Hyperlipidemia: Lipids on 11/16/2015 demonstrated total cholesterol 158, triglycerides 104, HDL 53, LDL 84. Continue Lipitor 40 mg daily.  4. Carotid artery stenosis s/p left CEA: Dopplers 03/2016 showed near complete occlusion of right common carotid, known occlusion of right ICA, and patent left CEA (<40%). Continue ASA and statin. Follows with vascular surgery.  5. Sleep apnea: Uses CPAP.   6. Leg swelling/venous varicosities: Continue use of compression stockings and Lasix twice weekly per his preference.  7. Atrial fibrillation: Remains in a regular rhythm. Occurred in post-operative setting. No longer on amiodarone and warfarin.  8. Leg edema/chronic systolic heart failure: EF 40-45% by TEE. Will continue Lasix 40 mg twice weekly as per his preference and KCl 20 meq when doing so. Encouraged continued compression stocking use.    Dispo: f/u 1 year.   Kate Sable, [name], F.A.C.C.

## 2016-05-15 NOTE — Patient Instructions (Signed)
Your physician wants you to follow-up in: 1 year Dr Virgina Jock will receive a reminder letter in the mail two months in advance. If you don't receive a letter, please call our office to schedule the follow-up appointment.    DECREASE Lopressor to 12.5 mg at bedtime    If you need a refill on your cardiac medications before your next appointment, please call your pharmacy.     Thank you for choosing [facility] !

## 2016-05-16 DIAGNOSIS — N312 Flaccid neuropathic bladder, not elsewhere classified: Secondary | ICD-10-CM | POA: Diagnosis not present

## 2016-05-16 DIAGNOSIS — N529 Male erectile dysfunction, unspecified: Secondary | ICD-10-CM | POA: Diagnosis not present

## 2016-05-16 DIAGNOSIS — N401 Enlarged prostate with lower urinary tract symptoms: Secondary | ICD-10-CM | POA: Diagnosis not present

## 2016-05-16 DIAGNOSIS — E291 Testicular hypofunction: Secondary | ICD-10-CM | POA: Diagnosis not present

## 2016-05-16 DIAGNOSIS — N138 Other obstructive and reflux uropathy: Secondary | ICD-10-CM | POA: Diagnosis not present

## 2016-05-19 DIAGNOSIS — L570 Actinic keratosis: Secondary | ICD-10-CM | POA: Diagnosis not present

## 2016-05-29 DIAGNOSIS — I10 Essential (primary) hypertension: Secondary | ICD-10-CM | POA: Diagnosis not present

## 2016-05-29 DIAGNOSIS — I2510 Atherosclerotic heart disease of native coronary artery without angina pectoris: Secondary | ICD-10-CM | POA: Diagnosis not present

## 2016-06-27 DIAGNOSIS — R339 Retention of urine, unspecified: Secondary | ICD-10-CM | POA: Diagnosis not present

## 2016-07-28 DIAGNOSIS — I2510 Atherosclerotic heart disease of native coronary artery without angina pectoris: Secondary | ICD-10-CM | POA: Diagnosis not present

## 2016-07-28 DIAGNOSIS — I10 Essential (primary) hypertension: Secondary | ICD-10-CM | POA: Diagnosis not present

## 2016-09-09 DIAGNOSIS — I2510 Atherosclerotic heart disease of native coronary artery without angina pectoris: Secondary | ICD-10-CM | POA: Diagnosis not present

## 2016-09-09 DIAGNOSIS — I10 Essential (primary) hypertension: Secondary | ICD-10-CM | POA: Diagnosis not present

## 2016-10-13 DIAGNOSIS — I10 Essential (primary) hypertension: Secondary | ICD-10-CM | POA: Diagnosis not present

## 2016-10-13 DIAGNOSIS — R609 Edema, unspecified: Secondary | ICD-10-CM | POA: Diagnosis not present

## 2016-11-24 DIAGNOSIS — N401 Enlarged prostate with lower urinary tract symptoms: Secondary | ICD-10-CM | POA: Diagnosis not present

## 2016-11-24 DIAGNOSIS — N528 Other male erectile dysfunction: Secondary | ICD-10-CM | POA: Diagnosis not present

## 2016-11-24 DIAGNOSIS — N138 Other obstructive and reflux uropathy: Secondary | ICD-10-CM | POA: Diagnosis not present

## 2016-11-24 DIAGNOSIS — N312 Flaccid neuropathic bladder, not elsewhere classified: Secondary | ICD-10-CM | POA: Diagnosis not present

## 2016-11-24 DIAGNOSIS — E291 Testicular hypofunction: Secondary | ICD-10-CM | POA: Diagnosis not present

## 2016-12-08 DIAGNOSIS — E785 Hyperlipidemia, unspecified: Secondary | ICD-10-CM | POA: Diagnosis not present

## 2016-12-08 DIAGNOSIS — I10 Essential (primary) hypertension: Secondary | ICD-10-CM | POA: Diagnosis not present

## 2016-12-08 DIAGNOSIS — Z79899 Other long term (current) drug therapy: Secondary | ICD-10-CM | POA: Diagnosis not present

## 2016-12-08 DIAGNOSIS — Z125 Encounter for screening for malignant neoplasm of prostate: Secondary | ICD-10-CM | POA: Diagnosis not present

## 2016-12-08 DIAGNOSIS — E669 Obesity, unspecified: Secondary | ICD-10-CM | POA: Diagnosis not present

## 2016-12-08 DIAGNOSIS — M109 Gout, unspecified: Secondary | ICD-10-CM | POA: Diagnosis not present

## 2016-12-15 DIAGNOSIS — I10 Essential (primary) hypertension: Secondary | ICD-10-CM | POA: Diagnosis not present

## 2016-12-15 DIAGNOSIS — I2510 Atherosclerotic heart disease of native coronary artery without angina pectoris: Secondary | ICD-10-CM | POA: Diagnosis not present

## 2016-12-15 DIAGNOSIS — E785 Hyperlipidemia, unspecified: Secondary | ICD-10-CM | POA: Diagnosis not present

## 2016-12-15 DIAGNOSIS — G4733 Obstructive sleep apnea (adult) (pediatric): Secondary | ICD-10-CM | POA: Diagnosis not present

## 2016-12-15 DIAGNOSIS — Z6834 Body mass index (BMI) 34.0-34.9, adult: Secondary | ICD-10-CM | POA: Diagnosis not present

## 2016-12-23 ENCOUNTER — Encounter: Payer: Self-pay | Admitting: Cardiovascular Disease

## 2017-01-02 ENCOUNTER — Encounter (INDEPENDENT_AMBULATORY_CARE_PROVIDER_SITE_OTHER): Payer: Self-pay | Admitting: Internal Medicine

## 2017-01-05 DIAGNOSIS — H35371 Puckering of macula, right eye: Secondary | ICD-10-CM | POA: Diagnosis not present

## 2017-02-20 DIAGNOSIS — I10 Essential (primary) hypertension: Secondary | ICD-10-CM | POA: Diagnosis not present

## 2017-02-20 DIAGNOSIS — M5412 Radiculopathy, cervical region: Secondary | ICD-10-CM | POA: Diagnosis not present

## 2017-03-23 DIAGNOSIS — Z23 Encounter for immunization: Secondary | ICD-10-CM | POA: Diagnosis not present

## 2017-03-31 ENCOUNTER — Ambulatory Visit (INDEPENDENT_AMBULATORY_CARE_PROVIDER_SITE_OTHER): Payer: Medicare Other | Admitting: Family

## 2017-03-31 ENCOUNTER — Encounter: Payer: Self-pay | Admitting: Family

## 2017-03-31 ENCOUNTER — Ambulatory Visit (HOSPITAL_COMMUNITY)
Admission: RE | Admit: 2017-03-31 | Discharge: 2017-03-31 | Disposition: A | Discharge status: Home / Self Care | Payer: Medicare Other | Source: Ambulatory Visit | Attending: Family | Admitting: Family

## 2017-03-31 VITALS — BP 152/64 | HR 51 | Temp 97.1°F | Resp 18 | Ht 69.0 in | Wt 240.0 lb

## 2017-03-31 DIAGNOSIS — I6521 Occlusion and stenosis of right carotid artery: Secondary | ICD-10-CM

## 2017-03-31 DIAGNOSIS — Z9889 Other specified postprocedural states: Secondary | ICD-10-CM | POA: Insufficient documentation

## 2017-03-31 DIAGNOSIS — I6522 Occlusion and stenosis of left carotid artery: Secondary | ICD-10-CM | POA: Diagnosis not present

## 2017-03-31 LAB — VAS US CAROTID
LCCAPSYS: -22 cm/s
LEFT ECA DIAS: -17 cm/s
LICADDIAS: -35 cm/s
LICADSYS: -123 cm/s
Left CCA dist dias: -32 cm/s
Left CCA dist sys: -134 cm/s
Left CCA prox dias: 27 cm/s
Left ICA prox dias: -26 cm/s
Left ICA prox sys: -112 cm/s
Right CCA prox sys: 55 cm/s

## 2017-03-31 NOTE — Progress Notes (Signed)
Chief Complaint: Follow up Extracranial Carotid Artery Stenosis   History of Present Illness  Caleb Wolf is a 76 y.o. male patient of Dr. Kellie Simmering returns for continued follow-up regarding his left carotid endarterectomy site. He has a known right ICA occlusion.  He had a resection of a redundant segment of the left internal carotid artery with primary reanastomosis. He has had no neurologic symptoms since Dr. Kellie Simmering performed his surgery in September 2015 in conjunction with coronary artery bypass grafting.   He continues to take one aspirin per day. He denies lower extremity claudication symptoms. He is doing well from a cardiac standpoint.  He denies any known history of stroke or TIA. Specifically he deniesa history of amaurosis fugax or monocular blindness, unilateral facial drooping, hemiplegia, orreceptive or expressive aphasia.   Pt states his systolic blood pressure is usually in the 130's.   Pt Diabetic: no Pt smoker: former smoker, quit in 1984, smoked x 25 years  Pt meds include: Statin : yes ASA: yes Other anticoagulants/antiplatelets: no    Past Medical History:  Diagnosis Date  . Arthritis   . Basal cell carcinoma   . Carotid stenosis    07/04/11- 50-70%bilaterally- stable per Dr Willey Blade note  . Chronic back pain   . Coronary artery disease   . Dysrhythmia   . Enlarged prostate    takes Finasteride daily  . GERD (gastroesophageal reflux disease)   . Gout    takes Indocin daily as needed  . History of colon polyps   . History of kidney stones   . Hyperlipidemia    takes Simvastatin daily  . Hypertension   . Imbalance   . Joint pain   . Lower leg edema   . Lumbar disc disease   . Myocardial infarction ([place])    2015  . PONV (postoperative nausea and vomiting)    also hard to urinate after surgery  . Sleep apnea    uses CPAP  . Urinary urgency   . Wears glasses     Social History Social History  Substance Use Topics  . Smoking status:  Former Smoker    Packs/day: 1.50    Years: 25.00    Types: Cigarettes    Start date: 08/08/1960    Quit date: 06/02/1982  . Smokeless tobacco: Never Used     Comment: quit smoking 30+yrs ago  . Alcohol use No     Comment: occasionally     Family History Family History  Problem Relation Age of Onset  . Allergies Mother   . Heart disease Mother   . Hypertension Mother   . Heart disease Father        MI    Surgical History Past Surgical History:  Procedure Laterality Date  . BACK SURGERY    . CARDIAC CATHETERIZATION  05/22/2014   Procedure: IABP INSERTION;  Surgeon: Leonie Man, MD;  Location: [facility] CATH LAB;  Service: Cardiovascular;;  . cataract surgery Right   . COLONOSCOPY    . CORONARY ARTERY BYPASS GRAFT N/A 05/22/2014   Procedure: CORONARY ARTERY BYPASS GRAFTING (CABG) times three using left internal mammary and right saphenous vein.;  Surgeon: Melrose Nakayama, MD;  Location: [place];  Service: Open Heart Surgery;  Laterality: N/A;  . ENDARTERECTOMY Left 02/17/2014   Procedure: ENDARTERECTOMY CAROTID WITH PATCH ANGIOPLASTY;  Surgeon: Mal Misty, MD;  Location: [place];  Service: Vascular;  Laterality: Left;  . ESOPHAGEAL DILATION N/A 08/22/2015   Procedure: ESOPHAGEAL DILATION;  Surgeon: Rogene Houston, MD;  Location: [facility] SU[facility];  Service: Endoscopy;  Laterality: N/A;  . ESOPHAGOGASTRODUODENOSCOPY N/A 08/22/2015   Procedure: ESOPHAGOGASTRODUODENOSCOPY (EGD);  Surgeon: Rogene Houston, MD;  Location: [facility];  Service: Endoscopy;  Laterality: N/A;  12:45 - moved to 1:55 - Ann notified pt  . EYE SURGERY     cataract extraction (right) with repair macular tear , with IOL     right  . FRACTURE SURGERY     bilateral wrist fractures- one ORIF  . JOINT REPLACEMENT  2011   left knee  . LEFT HEART CATHETERIZATION WITH CORONARY ANGIOGRAM N/A 05/22/2014   Procedure: LEFT HEART CATHETERIZATION WITH CORONARY ANGIOGRAM;  Surgeon: Leonie Man, MD;  Location: [facility] CATH  LAB;  Service: Cardiovascular;  Laterality: N/A;  . LITHOTRIPSY    . PARS PLANA VITRECTOMY W/ REPAIR OF MACULAR HOLE    . RHINOPLASTY    . TONSILLECTOMY    . TOTAL HIP ARTHROPLASTY  12/08/2011   Procedure: TOTAL HIP ARTHROPLASTY;  Surgeon: Gearlean Alf, MD;  Location: [facility] OR[facility];  Service: Orthopedics;  Laterality: Right;  . UPPER GI ENDOSCOPY  12/18/2015   Procedure: UPPER GI ENDOSCOPY;  Surgeon: Ralene Ok, MD;  Location: [facility] ORS;  Service: General;;  . WRIST SURGERY Right 60yrs ago  . WRIST SURGERY Left     Allergies  Allergen Reactions  . Codeine Sulfate Nausea Only    Current Outpatient Prescriptions  Medication Sig Dispense Refill  . aspirin 81 MG tablet Take 1 tablet (81 mg total) by mouth daily. 30 tablet   . atorvastatin (LIPITOR) 40 MG tablet Take 1 tablet (40 mg total) by mouth every evening. 90 tablet 3  . colchicine 0.6 MG tablet Take 0.6 mg by mouth as needed (gout).     Marland Kitchen doxazosin (CARDURA) 2 MG tablet Take 1 tablet (2 mg total) by mouth daily. 90 tablet 3  . finasteride (PROSCAR) 5 MG tablet Take 5 mg by mouth at bedtime.     . furosemide (LASIX) 40 MG tablet Take 1 tablet (40 mg total) by mouth as directed. 2 times per wk. 90 tablet 3  . lisinopril (PRINIVIL,ZESTRIL) 5 MG tablet Take 1 tablet (5 mg total) by mouth daily. 90 tablet 2  . nitroGLYCERIN (NITROSTAT) 0.4 MG SL tablet Place 1 tablet (0.4 mg total) under the tongue every 5 (five) minutes as needed for chest pain. 25 tablet 3  . pantoprazole (PROTONIX) 40 MG tablet Take 1 tablet (40 mg total) by mouth daily before breakfast. (Patient taking differently: Take 40 mg by mouth at bedtime. ) 90 tablet 3  . potassium chloride SA (K-DUR,KLOR-CON) 20 MEQ tablet Take 1 tablet (20 mEq total) by mouth as directed. 2 times per wk. 90 tablet 3  . metoprolol tartrate (LOPRESSOR) 25 MG tablet Take 0.5 tablets (12.5 mg total) by mouth at bedtime. 45 tablet 3   No current facility-administered medications for this visit.      Review of Systems : See HPI for pertinent positives and negatives.  Physical Examination  Vitals:   03/31/17 1046 03/31/17 1048  BP: (!) 139/55 (!) 152/64  Pulse: (!) 51   Resp: 18   Temp: (!) 97.1 F (36.2 C)   TempSrc: Oral   SpO2: 99%   Weight: 240 lb (108.9 kg)   Height: 5\' 9"  (1.753 m)    Body mass index is 35.44 kg/m.  General: WDWN obese male in NAD GAIT: normal Eyes: PERRLA Pulmonary:  Respirations are  non-labored, good air movement, CTAB.  Cardiac: regular rhythm, no detected murmur.  VASCULAR EXAM Carotid Bruits Right Left   Negative Negative     Abdominal aortic pulse is not palpable. Radial pulses: not palpable on the right, right brachial is 2+ palpable, left is 1+ palpable                                                                                                                                       LE Pulses Right Left       POPLITEAL  not palpable   not palpable       POSTERIOR TIBIAL  not palpable    palpable        DORSALIS PEDIS      ANTERIOR TIBIAL faintly palpable  Faintly palpable     Gastrointestinal: soft, nontender, BS WNL, no r/g, no palpable masses.  Musculoskeletal: no muscle atrophy/wasting. M/S 5/5 throughout, extremities without ischemic changes. Left lower leg and ankle with 1-2+ pitting edema, right is trace to 1+ pitting edema.   Skin: No cellulitis, no rash, no ulcers.   Neurologic: A&O X 3; Appropriate Affect, Speech is normal, CN 2-12 intact, pain and light touch intact in extremities, Motor exam as listed above.      Assessment: Caleb Wolf is a 76 y.o. male who is s/p resection of a redundant segment of the left internal carotid artery with primary reanastomosis in September 2015 in conjunction with coronary artery bypass grafting.  He has no history of stroke or TIA.   DATA Carotid Duplex (03/31/17):  Known occlusion of right ICA, near occlusion of right CCA. Left CEA site with <40%  stenosis. Bilateral vertebral arteries are antegrade. Bilateral subclavian arteries are triphasic. No significant change compared to the exams on 03/20/15 and 03-25-16.   Plan: Follow-up in 1 year with Carotid Duplex scan.    I discussed in depth with the patient the nature of atherosclerosis, and emphasized the importance of maximal medical management including strict control of blood pressure, blood glucose, and lipid levels, obtaining regular exercise, and continued cessation of smoking.  The patient is aware that without maximal medical management the underlying atherosclerotic disease process will progress, limiting the benefit of any interventions. The patient was given information about stroke prevention and what symptoms should prompt the patient to seek immediate medical care. Thank you for allowing [place] to participate in this patient's care.  Clemon Chambers, RN, MSN, FNP-C [facility] Office: [facility] Physician: Early  03/31/17 11:03 AM

## 2017-03-31 NOTE — Patient Instructions (Signed)
Stroke Prevention Some medical conditions and behaviors are associated with an increased chance of having a stroke. You may prevent a stroke by making healthy choices and managing medical conditions. How can I reduce my risk of having a stroke?  Stay physically active. Get at least 30 minutes of activity on most or all days.  Do not smoke. It may also be helpful to avoid exposure to secondhand smoke.  Limit alcohol use. Moderate alcohol use is considered to be: ? No more than 2 drinks per day for men. ? No more than 1 drink per day for nonpregnant women.  Eat healthy foods. This involves: ? Eating 5 or more servings of fruits and vegetables a day. ? Making dietary changes that address high blood pressure (hypertension), high cholesterol, diabetes, or obesity.  Manage your cholesterol levels. ? Making food choices that are high in fiber and low in saturated fat, trans fat, and cholesterol may control cholesterol levels. ? Take any prescribed medicines to control cholesterol as directed by your health care provider.  Manage your diabetes. ? Controlling your carbohydrate and sugar intake is recommended to manage diabetes. ? Take any prescribed medicines to control diabetes as directed by your health care provider.  Control your hypertension. ? Making food choices that are low in salt (sodium), saturated fat, trans fat, and cholesterol is recommended to manage hypertension. ? Ask your health care provider if you need treatment to lower your blood pressure. Take any prescribed medicines to control hypertension as directed by your health care provider. ? If you are 18-39 years of age, have your blood pressure checked every 3-5 years. If you are 40 years of age or older, have your blood pressure checked every year.  Maintain a healthy weight. ? Reducing calorie intake and making food choices that are low in sodium, saturated fat, trans fat, and cholesterol are recommended to manage  weight.  Stop drug abuse.  Avoid taking birth control pills. ? Talk to your health care provider about the risks of taking birth control pills if you are over 35 years old, smoke, get migraines, or have ever had a blood clot.  Get evaluated for sleep disorders (sleep apnea). ? Talk to your health care provider about getting a sleep evaluation if you snore a lot or have excessive sleepiness.  Take medicines only as directed by your health care provider. ? For some people, aspirin or blood thinners (anticoagulants) are helpful in reducing the risk of forming abnormal blood clots that can lead to stroke. If you have the irregular heart rhythm of atrial fibrillation, you should be on a blood thinner unless there is a good reason you cannot take them. ? Understand all your medicine instructions.  Make sure that other conditions (such as anemia or atherosclerosis) are addressed. Get help right away if:  You have sudden weakness or numbness of the face, arm, or leg, especially on one side of the body.  Your face or eyelid droops to one side.  You have sudden confusion.  You have trouble speaking (aphasia) or understanding.  You have sudden trouble seeing in one or both eyes.  You have sudden trouble walking.  You have dizziness.  You have a loss of balance or coordination.  You have a sudden, severe headache with no known cause.  You have new chest pain or an irregular heartbeat. Any of these symptoms may represent a serious problem that is an emergency. Do not wait to see if the symptoms will go away.   Get medical help at once. Call your local emergency services (911 in [place] Do not drive yourself to the hospital. This information is not intended to replace advice given to you by your health care provider. Make sure you discuss any questions you have with your health care provider. Document Released: 06/26/2004 Document Revised: 10/25/2015 Document Reviewed: 11/19/2012 Elsevier  Interactive Patient Education  2017 Elsevier Inc.     Preventing Cerebrovascular Disease Arteries are blood vessels that carry blood that contains oxygen from the heart to all parts of the body. Cerebrovascular disease affects arteries that supply the brain. Any condition that blocks or disrupts blood flow to the brain can cause cerebrovascular disease. Brain cells that lose blood supply start to die within minutes (stroke). Stroke is the main danger of cerebrovascular disease. Atherosclerosis and high blood pressure are common causes of cerebrovascular disease. Atherosclerosis is narrowing and hardening of an artery that results when fat, cholesterol, calcium, or other substances (plaque) build up inside an artery. Plaque reduces blood flow through the artery. High blood pressure increases the risk of bleeding inside the brain. Making diet and lifestyle changes to prevent atherosclerosis and high blood pressure lowers your risk of cerebrovascular disease. What nutrition changes can be made?  Eat more fruits, vegetables, and whole grains.  Reduce how much saturated fat you eat. To do this, eat less red meat and fewer full-fat dairy products.  Eat healthy proteins instead of red meat. Healthy proteins include: ? Fish. Eat fish that contains heart-healthy omega-3 fatty acids, twice a week. Examples include salmon, albacore tuna, mackerel, and herring. ? Chicken. ? Nuts. ? Low-fat or nonfat yogurt.  Avoid processed meats, like bacon and lunchmeat.  Avoid foods that contain: ? A lot of sugar, such as sweets and drinks with added sugar. ? A lot of salt (sodium). Avoid adding extra salt to your food, as told by your health care provider. ? Trans fats, such as margarine and baked goods. Trans fats may be listed as "partially hydrogenated oils" on food labels.  Check food labels to see how much sodium, sugar, and trans fats are in foods.  Use vegetable oils that contain low amounts of  saturated fat, such as olive oil or canola oil. What lifestyle changes can be made?  Drink alcohol in moderation. This means no more than 1 drink a day for nonpregnant women and 2 drinks a day for men. One drink equals 12 oz of beer, 5 oz of wine, or 1 oz of hard liquor.  If you are overweight, ask your health care provider to recommend a weight-loss plan for you. Losing 5-10 lb (2.2-4.5 kg) can reduce your risk of diabetes, atherosclerosis, and high blood pressure.  Exercise for 30?60 minutes on most days, or as much as told by your health care provider. ? Do moderate-intensity exercise, such as brisk walking, bicycling, and water aerobics. Ask your health care provider which activities are safe for you.  Do not use any products that contain nicotine or tobacco, such as cigarettes and e-cigarettes. If you need help quitting, ask your health care provider. Why are these changes important? Making these changes lowers your risk of many diseases that can cause cerebrovascular disease and stroke. Stroke is a leading cause of death and disability. Making these changes also improves your overall health and quality of life. What can I do to lower my risk? The following factors make you more likely to develop cerebrovascular disease:  Being overweight.  Smoking.  Being physically inactive.    Eating a high-fat diet.  Having certain health conditions, such as: ? Diabetes. ? High blood pressure. ? Heart disease. ? Atherosclerosis. ? High cholesterol. ? Sickle cell disease.  Talk with your health care provider about your risk for cerebrovascular disease. Work with your health care provider to control diseases that you have that may contribute to cerebrovascular disease. Your health care provider may prescribe medicines to help prevent major causes of cerebrovascular disease. Where to find more information: Learn more about preventing cerebrovascular disease from:  National Heart, Lung, and  Blood Institute: www.nhlbi.nih.gov/health/health-topics/topics/stroke  Centers for Disease Control and Prevention: cdc.gov/stroke/about.htm  Summary  Cerebrovascular disease can lead to a stroke.  Atherosclerosis and high blood pressure are major causes of cerebrovascular disease.  Making diet and lifestyle changes can reduce your risk of cerebrovascular disease.  Work with your health care provider to get your risk factors under control to reduce your risk of cerebrovascular disease. This information is not intended to replace advice given to you by your health care provider. Make sure you discuss any questions you have with your health care provider. Document Released: 06/03/2015 Document Revised: 12/07/2015 Document Reviewed: 06/03/2015 Elsevier Interactive Patient Education  2018 Elsevier Inc.  

## 2017-04-16 NOTE — Addendum Note (Signed)
Addended by: Lianne Cure A on: 04/16/2017 09:58 AM   Modules accepted: Orders

## 2017-04-28 ENCOUNTER — Ambulatory Visit (INDEPENDENT_AMBULATORY_CARE_PROVIDER_SITE_OTHER): Payer: Medicare Other | Admitting: Internal Medicine

## 2017-04-28 ENCOUNTER — Inpatient Hospital Stay (HOSPITAL_COMMUNITY)
Admission: EM | Admit: 2017-04-28 | Discharge: 2017-04-30 | DRG: 309 | Disposition: A | Discharge status: Home / Self Care | Payer: Medicare Other | Attending: Internal Medicine | Admitting: Internal Medicine

## 2017-04-28 ENCOUNTER — Encounter (HOSPITAL_COMMUNITY): Payer: Self-pay

## 2017-04-28 ENCOUNTER — Emergency Department (HOSPITAL_COMMUNITY): Payer: Medicare Other

## 2017-04-28 ENCOUNTER — Other Ambulatory Visit: Payer: Self-pay

## 2017-04-28 ENCOUNTER — Encounter (INDEPENDENT_AMBULATORY_CARE_PROVIDER_SITE_OTHER): Payer: Self-pay | Admitting: Internal Medicine

## 2017-04-28 ENCOUNTER — Telehealth (INDEPENDENT_AMBULATORY_CARE_PROVIDER_SITE_OTHER): Payer: Self-pay | Admitting: Internal Medicine

## 2017-04-28 VITALS — BP 110/64 | HR 106 | Temp 98.0°F | Resp 18 | Ht 70.0 in | Wt 240.6 lb

## 2017-04-28 DIAGNOSIS — R262 Difficulty in walking, not elsewhere classified: Secondary | ICD-10-CM

## 2017-04-28 DIAGNOSIS — G4733 Obstructive sleep apnea (adult) (pediatric): Secondary | ICD-10-CM | POA: Diagnosis present

## 2017-04-28 DIAGNOSIS — I340 Nonrheumatic mitral (valve) insufficiency: Secondary | ICD-10-CM | POA: Diagnosis not present

## 2017-04-28 DIAGNOSIS — I10 Essential (primary) hypertension: Secondary | ICD-10-CM | POA: Diagnosis present

## 2017-04-28 DIAGNOSIS — R000 Tachycardia, unspecified: Secondary | ICD-10-CM

## 2017-04-28 DIAGNOSIS — I4892 Unspecified atrial flutter: Secondary | ICD-10-CM | POA: Diagnosis present

## 2017-04-28 DIAGNOSIS — N179 Acute kidney failure, unspecified: Secondary | ICD-10-CM | POA: Diagnosis not present

## 2017-04-28 DIAGNOSIS — R749 Abnormal serum enzyme level, unspecified: Secondary | ICD-10-CM | POA: Diagnosis present

## 2017-04-28 DIAGNOSIS — I4510 Unspecified right bundle-branch block: Secondary | ICD-10-CM | POA: Diagnosis present

## 2017-04-28 DIAGNOSIS — I429 Cardiomyopathy, unspecified: Secondary | ICD-10-CM | POA: Diagnosis present

## 2017-04-28 DIAGNOSIS — I25119 Atherosclerotic heart disease of native coronary artery with unspecified angina pectoris: Secondary | ICD-10-CM

## 2017-04-28 DIAGNOSIS — Z7901 Long term (current) use of anticoagulants: Secondary | ICD-10-CM | POA: Diagnosis not present

## 2017-04-28 DIAGNOSIS — M549 Dorsalgia, unspecified: Secondary | ICD-10-CM | POA: Diagnosis present

## 2017-04-28 DIAGNOSIS — K219 Gastro-esophageal reflux disease without esophagitis: Secondary | ICD-10-CM | POA: Diagnosis present

## 2017-04-28 DIAGNOSIS — Z96641 Presence of right artificial hip joint: Secondary | ICD-10-CM | POA: Diagnosis present

## 2017-04-28 DIAGNOSIS — Z85828 Personal history of other malignant neoplasm of skin: Secondary | ICD-10-CM

## 2017-04-28 DIAGNOSIS — N1832 Chronic kidney disease, stage 3b: Secondary | ICD-10-CM | POA: Diagnosis present

## 2017-04-28 DIAGNOSIS — Z9841 Cataract extraction status, right eye: Secondary | ICD-10-CM

## 2017-04-28 DIAGNOSIS — Z961 Presence of intraocular lens: Secondary | ICD-10-CM | POA: Diagnosis present

## 2017-04-28 DIAGNOSIS — M109 Gout, unspecified: Secondary | ICD-10-CM | POA: Diagnosis present

## 2017-04-28 DIAGNOSIS — Z885 Allergy status to narcotic agent status: Secondary | ICD-10-CM

## 2017-04-28 DIAGNOSIS — Z86010 Personal history of colonic polyps: Secondary | ICD-10-CM | POA: Diagnosis not present

## 2017-04-28 DIAGNOSIS — Z96652 Presence of left artificial knee joint: Secondary | ICD-10-CM | POA: Diagnosis present

## 2017-04-28 DIAGNOSIS — Z79899 Other long term (current) drug therapy: Secondary | ICD-10-CM | POA: Diagnosis not present

## 2017-04-28 DIAGNOSIS — I2510 Atherosclerotic heart disease of native coronary artery without angina pectoris: Secondary | ICD-10-CM | POA: Diagnosis not present

## 2017-04-28 DIAGNOSIS — I5020 Unspecified systolic (congestive) heart failure: Secondary | ICD-10-CM | POA: Diagnosis present

## 2017-04-28 DIAGNOSIS — R9431 Abnormal electrocardiogram [ECG] [EKG]: Secondary | ICD-10-CM | POA: Diagnosis present

## 2017-04-28 DIAGNOSIS — I252 Old myocardial infarction: Secondary | ICD-10-CM

## 2017-04-28 DIAGNOSIS — Z973 Presence of spectacles and contact lenses: Secondary | ICD-10-CM

## 2017-04-28 DIAGNOSIS — Z951 Presence of aortocoronary bypass graft: Secondary | ICD-10-CM | POA: Diagnosis not present

## 2017-04-28 DIAGNOSIS — G8929 Other chronic pain: Secondary | ICD-10-CM | POA: Diagnosis present

## 2017-04-28 DIAGNOSIS — R609 Edema, unspecified: Secondary | ICD-10-CM | POA: Diagnosis present

## 2017-04-28 DIAGNOSIS — I509 Heart failure, unspecified: Secondary | ICD-10-CM | POA: Diagnosis present

## 2017-04-28 DIAGNOSIS — I130 Hypertensive heart and chronic kidney disease with heart failure and stage 1 through stage 4 chronic kidney disease, or unspecified chronic kidney disease: Secondary | ICD-10-CM | POA: Diagnosis present

## 2017-04-28 DIAGNOSIS — I6523 Occlusion and stenosis of bilateral carotid arteries: Secondary | ICD-10-CM | POA: Diagnosis present

## 2017-04-28 DIAGNOSIS — E785 Hyperlipidemia, unspecified: Secondary | ICD-10-CM | POA: Diagnosis present

## 2017-04-28 DIAGNOSIS — K220 Achalasia of cardia: Secondary | ICD-10-CM | POA: Diagnosis not present

## 2017-04-28 DIAGNOSIS — Z87891 Personal history of nicotine dependence: Secondary | ICD-10-CM

## 2017-04-28 DIAGNOSIS — I444 Left anterior fascicular block: Secondary | ICD-10-CM | POA: Diagnosis present

## 2017-04-28 DIAGNOSIS — N401 Enlarged prostate with lower urinary tract symptoms: Secondary | ICD-10-CM | POA: Diagnosis present

## 2017-04-28 DIAGNOSIS — I481 Persistent atrial fibrillation: Principal | ICD-10-CM | POA: Diagnosis present

## 2017-04-28 DIAGNOSIS — I4891 Unspecified atrial fibrillation: Secondary | ICD-10-CM

## 2017-04-28 DIAGNOSIS — N182 Chronic kidney disease, stage 2 (mild): Secondary | ICD-10-CM | POA: Diagnosis not present

## 2017-04-28 DIAGNOSIS — Z87442 Personal history of urinary calculi: Secondary | ICD-10-CM

## 2017-04-28 DIAGNOSIS — I6529 Occlusion and stenosis of unspecified carotid artery: Secondary | ICD-10-CM | POA: Diagnosis not present

## 2017-04-28 DIAGNOSIS — Z8679 Personal history of other diseases of the circulatory system: Secondary | ICD-10-CM | POA: Diagnosis not present

## 2017-04-28 DIAGNOSIS — I6522 Occlusion and stenosis of left carotid artery: Secondary | ICD-10-CM | POA: Diagnosis not present

## 2017-04-28 DIAGNOSIS — I5022 Chronic systolic (congestive) heart failure: Secondary | ICD-10-CM | POA: Diagnosis not present

## 2017-04-28 DIAGNOSIS — Z7982 Long term (current) use of aspirin: Secondary | ICD-10-CM

## 2017-04-28 DIAGNOSIS — R748 Abnormal levels of other serum enzymes: Secondary | ICD-10-CM | POA: Diagnosis not present

## 2017-04-28 DIAGNOSIS — Z8249 Family history of ischemic heart disease and other diseases of the circulatory system: Secondary | ICD-10-CM

## 2017-04-28 HISTORY — DX: Essential (primary) hypertension: I10

## 2017-04-28 LAB — I-STAT TROPONIN, ED: Troponin i, poc: 0 ng/mL (ref 0.00–0.08)

## 2017-04-28 LAB — CBC WITH DIFFERENTIAL/PLATELET
BASOS ABS: 0 10*3/uL (ref 0.0–0.1)
Basophils Relative: 0 %
EOS PCT: 4 %
Eosinophils Absolute: 0.3 10*3/uL (ref 0.0–0.7)
HEMATOCRIT: 42.4 % (ref 39.0–52.0)
Hemoglobin: 13.4 g/dL (ref 13.0–17.0)
LYMPHS ABS: 1.8 10*3/uL (ref 0.7–4.0)
LYMPHS PCT: 26 %
MCH: 30.7 pg (ref 26.0–34.0)
MCHC: 31.6 g/dL (ref 30.0–36.0)
MCV: 97.2 fL (ref 78.0–100.0)
Monocytes Absolute: 0.7 10*3/uL (ref 0.1–1.0)
Monocytes Relative: 10 %
NEUTROS ABS: 4.4 10*3/uL (ref 1.7–7.7)
Neutrophils Relative %: 60 %
PLATELETS: 177 10*3/uL (ref 150–400)
RBC: 4.36 MIL/uL (ref 4.22–5.81)
RDW: 13.6 % (ref 11.5–15.5)
WBC: 7.2 10*3/uL (ref 4.0–10.5)

## 2017-04-28 LAB — TSH: TSH: 3.595 u[IU]/mL (ref 0.350–4.500)

## 2017-04-28 LAB — BASIC METABOLIC PANEL
Anion gap: 7 (ref 5–15)
BUN: 23 mg/dL — AB (ref 6–20)
CHLORIDE: 103 mmol/L (ref 101–111)
CO2: 28 mmol/L (ref 22–32)
Calcium: 9.3 mg/dL (ref 8.9–10.3)
Creatinine, Ser: 1.29 mg/dL — ABNORMAL HIGH (ref 0.61–1.24)
GFR calc Af Amer: >60 mL/min (ref >60)
GFR, EST NON AFRICAN AMERICAN: 53 mL/min — AB (ref >60)
GLUCOSE: 111 mg/dL — AB (ref 65–99)
POTASSIUM: 4.3 mmol/L (ref 3.5–5.1)
Sodium: 138 mmol/L (ref 135–145)

## 2017-04-28 LAB — HEPATIC FUNCTION PANEL
ALBUMIN: 4.3 g/dL (ref 3.5–5.0)
ALK PHOS: 80 U/L (ref 38–126)
ALT: 20 U/L (ref 17–63)
AST: 18 U/L (ref 15–41)
BILIRUBIN TOTAL: 0.6 mg/dL (ref 0.3–1.2)
Bilirubin, Direct: 0.1 mg/dL (ref 0.1–0.5)
Indirect Bilirubin: 0.5 mg/dL (ref 0.3–0.9)
Total Protein: 6.9 g/dL (ref 6.5–8.1)

## 2017-04-28 LAB — TROPONIN I: TROPONIN I: 0.04 ng/mL — AB (ref <0.03)

## 2017-04-28 LAB — MRSA PCR SCREENING: MRSA BY PCR: NEGATIVE

## 2017-04-28 LAB — HEPARIN LEVEL (UNFRACTIONATED): Heparin Unfractionated: 0.63 IU/mL (ref 0.30–0.70)

## 2017-04-28 MED ORDER — SODIUM CHLORIDE 0.9 % IV SOLN: 250.0000 mL | INTRAVENOUS | Status: DC

## 2017-04-28 MED ORDER — PANTOPRAZOLE SODIUM 40 MG PO TBEC
40.0000 mg | DELAYED_RELEASE_TABLET | Freq: Every day | ORAL | Status: DC
  Administered 2017-04-28 – 2017-04-29 (×2): 40 mg via ORAL
  Filled 2017-04-28 (×2): qty 1

## 2017-04-28 MED ORDER — SODIUM CHLORIDE 0.9% FLUSH: 3.0000 mL | INTRAVENOUS | Status: DC | PRN

## 2017-04-28 MED ORDER — HEPARIN (PORCINE) IN NACL 100-0.45 UNIT/ML-% IJ SOLN
1400.0000 [IU]/h | INTRAMUSCULAR | Status: DC
  Administered 2017-04-28 – 2017-04-29 (×2): 1400 [IU]/h via INTRAVENOUS
  Filled 2017-04-28 (×2): qty 250

## 2017-04-28 MED ORDER — SODIUM CHLORIDE 0.9 % IV SOLN: INTRAVENOUS | Status: DC

## 2017-04-28 MED ORDER — SODIUM CHLORIDE 0.9% FLUSH
3.0000 mL | Freq: Two times a day (BID) | INTRAVENOUS | Status: DC
Start: 2017-04-28 — End: 2017-04-29
  Administered 2017-04-28: 3 mL via INTRAVENOUS

## 2017-04-28 MED ORDER — METOPROLOL TARTRATE 25 MG PO TABS
12.5000 mg | ORAL_TABLET | Freq: Every day | ORAL | Status: DC
  Administered 2017-04-28 – 2017-04-29 (×2): 12.5 mg via ORAL
  Filled 2017-04-28 (×2): qty 1

## 2017-04-28 MED ORDER — HEPARIN BOLUS VIA INFUSION
4000.0000 [IU] | Freq: Once | INTRAVENOUS | Status: AC
  Administered 2017-04-28: 4000 [IU] via INTRAVENOUS

## 2017-04-28 MED ORDER — HEPARIN BOLUS VIA INFUSION: 4000.0000 [IU] | Freq: Once | INTRAVENOUS | Status: DC

## 2017-04-28 MED ORDER — AMIODARONE LOAD VIA INFUSION
150.0000 mg | Freq: Once | INTRAVENOUS | Status: AC
  Administered 2017-04-28: 150 mg via INTRAVENOUS
  Filled 2017-04-28: qty 83.34

## 2017-04-28 MED ORDER — POLYETHYLENE GLYCOL 3350 17 G PO PACK: 17.0000 g | PACK | Freq: Every day | ORAL | Status: DC | PRN

## 2017-04-28 MED ORDER — DILTIAZEM HCL 25 MG/5ML IV SOLN
10.0000 mg | Freq: Once | INTRAVENOUS | Status: AC
  Administered 2017-04-28: 10 mg via INTRAVENOUS
  Filled 2017-04-28: qty 5

## 2017-04-28 MED ORDER — AMIODARONE HCL IN DEXTROSE 360-4.14 MG/200ML-% IV SOLN
60.0000 mg/h | INTRAVENOUS | Status: AC
  Administered 2017-04-28 (×2): 60 mg/h via INTRAVENOUS
  Filled 2017-04-28 (×2): qty 200

## 2017-04-28 MED ORDER — NITROGLYCERIN 0.4 MG SL SUBL: 0.4000 mg | SUBLINGUAL_TABLET | SUBLINGUAL | Status: DC | PRN

## 2017-04-28 MED ORDER — HEPARIN (PORCINE) IN NACL 100-0.45 UNIT/ML-% IJ SOLN: 1400.0000 [IU]/h | INTRAMUSCULAR | Status: DC

## 2017-04-28 MED ORDER — SODIUM CHLORIDE 0.9 % IV SOLN
INTRAVENOUS | Status: DC
  Administered 2017-04-28: 21:00:00 via INTRAVENOUS

## 2017-04-28 MED ORDER — COLCHICINE 0.6 MG PO TABS: 0.6000 mg | ORAL_TABLET | ORAL | Status: DC | PRN

## 2017-04-28 MED ORDER — ATORVASTATIN CALCIUM 40 MG PO TABS
40.0000 mg | ORAL_TABLET | Freq: Every evening | ORAL | Status: DC
  Administered 2017-04-28 – 2017-04-29 (×2): 40 mg via ORAL
  Filled 2017-04-28 (×2): qty 1

## 2017-04-28 MED ORDER — ACETAMINOPHEN 325 MG PO TABS: 650.0000 mg | ORAL_TABLET | Freq: Four times a day (QID) | ORAL | Status: DC | PRN

## 2017-04-28 MED ORDER — ONDANSETRON HCL 4 MG PO TABS: 4.0000 mg | ORAL_TABLET | Freq: Four times a day (QID) | ORAL | Status: DC | PRN

## 2017-04-28 MED ORDER — ACETAMINOPHEN 650 MG RE SUPP: 650.0000 mg | Freq: Four times a day (QID) | RECTAL | Status: DC | PRN

## 2017-04-28 MED ORDER — DOCUSATE SODIUM 100 MG PO CAPS
100.0000 mg | ORAL_CAPSULE | Freq: Two times a day (BID) | ORAL | Status: DC
  Administered 2017-04-28 – 2017-04-30 (×4): 100 mg via ORAL
  Filled 2017-04-28 (×4): qty 1

## 2017-04-28 MED ORDER — FINASTERIDE 5 MG PO TABS
5.0000 mg | ORAL_TABLET | Freq: Every day | ORAL | Status: DC
  Administered 2017-04-28 – 2017-04-29 (×2): 5 mg via ORAL
  Filled 2017-04-28 (×7): qty 1

## 2017-04-28 MED ORDER — TRAZODONE HCL 50 MG PO TABS
25.0000 mg | ORAL_TABLET | Freq: Every evening | ORAL | Status: DC | PRN
  Administered 2017-04-28: 25 mg via ORAL
  Filled 2017-04-28: qty 1

## 2017-04-28 MED ORDER — DILTIAZEM HCL-DEXTROSE 100-5 MG/100ML-% IV SOLN (PREMIX)
5.0000 mg/h | Freq: Once | INTRAVENOUS | Status: AC
  Administered 2017-04-28: 5 mg/h via INTRAVENOUS
  Filled 2017-04-28: qty 100

## 2017-04-28 MED ORDER — DOXAZOSIN MESYLATE 2 MG PO TABS
4.0000 mg | ORAL_TABLET | Freq: Every day | ORAL | Status: DC
  Administered 2017-04-29 – 2017-04-30 (×2): 4 mg via ORAL
  Filled 2017-04-28 (×2): qty 2

## 2017-04-28 MED ORDER — ONDANSETRON HCL 4 MG/2ML IJ SOLN: 4.0000 mg | Freq: Four times a day (QID) | INTRAMUSCULAR | Status: DC | PRN

## 2017-04-28 MED ORDER — AMIODARONE HCL IN DEXTROSE 360-4.14 MG/200ML-% IV SOLN
30.0000 mg/h | INTRAVENOUS | Status: DC
  Administered 2017-04-28: 30 mg/h via INTRAVENOUS
  Filled 2017-04-28: qty 200

## 2017-04-28 MED ORDER — ASPIRIN 81 MG PO CHEW
81.0000 mg | CHEWABLE_TABLET | Freq: Every day | ORAL | Status: DC
  Administered 2017-04-29: 81 mg via ORAL
  Filled 2017-04-28: qty 1

## 2017-04-28 NOTE — Progress Notes (Signed)
Presenting complaint;  Follow-up for GERD/history of achalasia.  Database and subjective:  Patient is 76 year old Caucasian male who is here for scheduled visit.  He has a history of achalasia and underwent laparoscopic myotomy with partial wrap in August 2017.  He was last seen in September 2017 and was having GERD symptoms.  He was begun on PPI. He states he does well during the daytime.  He has had episodes of nocturnal burning chest pain which wakes him at night.  It occurs once a week or once every other week.  He drinks water and is burning goes away.  He denies regurgitation sore throat dysphagia.  He states his appetite is very good.  He has gained 8 pounds since his last visit.  His bowels move daily.  He denies melena or rectal bleeding.  He states he noted a rapid heartbeat this morning but it has subsided.  He did not experience chest pain lightheadedness shortness of breath or diaphoresis. His last colonoscopy was in 2009.  Current Medications: Outpatient Encounter Medications as of 04/28/2017  Medication Sig  . aspirin 81 MG tablet Take 1 tablet (81 mg total) by mouth daily.  Marland Kitchen atorvastatin (LIPITOR) 40 MG tablet Take 1 tablet (40 mg total) by mouth every evening.  . colchicine 0.6 MG tablet Take 0.6 mg by mouth as needed (gout).   . cyclobenzaprine (FLEXERIL) 10 MG tablet TAKE 1 TABLET BY MOUTH EVERYDAY AT BEDTIME (STOP METHOCARBAMOL)  . doxazosin (CARDURA) 2 MG tablet Take 1 tablet (2 mg total) by mouth daily. (Patient taking differently: Take 4 mg by mouth daily. )  . finasteride (PROSCAR) 5 MG tablet Take 5 mg by mouth at bedtime.   . furosemide (LASIX) 40 MG tablet Take 1 tablet (40 mg total) by mouth as directed. 2 times per wk.  . lisinopril (PRINIVIL,ZESTRIL) 5 MG tablet Take 1 tablet (5 mg total) by mouth daily. (Patient taking differently: Take 40 mg by mouth daily. )  . metoprolol tartrate (LOPRESSOR) 25 MG tablet Take 12.5 mg by mouth at bedtime.  . nitroGLYCERIN  (NITROSTAT) 0.4 MG SL tablet Place 1 tablet (0.4 mg total) under the tongue every 5 (five) minutes as needed for chest pain.  . pantoprazole (PROTONIX) 40 MG tablet Take 1 tablet (40 mg total) by mouth daily before breakfast. (Patient taking differently: Take 40 mg by mouth at bedtime. )  . potassium chloride SA (K-DUR,KLOR-CON) 20 MEQ tablet Take 1 tablet (20 mEq total) by mouth as directed. 2 times per wk.  . [DISCONTINUED] metoprolol tartrate (LOPRESSOR) 25 MG tablet Take 0.5 tablets (12.5 mg total) by mouth at bedtime.   No facility-administered encounter medications on file as of 04/28/2017.      Objective: Blood pressure 110/64, pulse (!) 106, temperature 98 F (36.7 C), temperature source Oral, resp. rate 18, height 5\' 10"  (1.778 m), weight 240 lb 9.6 oz (109.1 kg).  Resting heart rate was between 140 and 50 bpm and it was irregular. Patient is alert and in no acute distress. Conjunctiva is pink. Sclera is nonicteric Oropharyngeal mucosa is normal. No neck masses or thyromegaly noted. Cardiac exam with regular rhythm normal S1 and S2. No murmur or gallop noted. Lungs are clear to auscultation. Abdomen is full but soft and nontender without organomegaly or masses.  Small umbilical hernia noted. No LE edema or clubbing noted.   Assessment:  #1.  Tachyarrhythmia.  Suspect he has new onset of atrial fibrillation.  He is asymptomatic.  Will transfer patient to  emergency room for further evaluation.  Discussed with Dr. Willey Blade.  #2.  GERD.  He is having intermittent nocturnal symptoms.  He needs to take pantoprazole before evening meal and not at bedtime.  Medicine may not be working if he does not eat after he takes the pill.  #3.  History of achalasia.  He underwent surgery in August last year and having no swallowing difficulty whatsoever.  #4.  Patient is average risk for CRC.  Last colonoscopy was in 2009.   Plan:  Will transport patient to the emergency room via  wheelchair. Patient will take pantoprazole 30 minutes before evening meal. Can use Tums on as-needed basis at bedtime. Will schedule average risk screening colonoscopy and surveillance EGD middle of next year. Office visit in 1 year.

## 2017-04-28 NOTE — Telephone Encounter (Signed)
Patient's spouse Caleb Wolf presented to the office to express her gratitude to Dr. Dereck Leep and Tammy for recognizing that her husband had a health issue this morning and for personally taking him to the hospital.  She was in prayer meeting when she received a call from [place], she stated that she didn't even look at her phone, she reached in her pocket to silence it because they were praying.  She couldn't say thank you enough.  They did admit him.

## 2017-04-28 NOTE — ED Notes (Signed)
DC Ditiaziam per Beckie Busing from [facility].  See new orders

## 2017-04-28 NOTE — ED Triage Notes (Signed)
Pt reports he woke up and felt like heart was beating fast.  Says was at Dr. Olevia Perches office and the feeling became stronger.  Dr. Laural Golden reports hr was 140-160.  Denies any pain, dizziness, or sob.

## 2017-04-28 NOTE — H&P (Addendum)
History and Physical  [name]:[id number] DOB: 05-Jul-1940 DOA: 04/28/2017  Referring physician: Roderic Palau MD PCP: Asencion Noble, MD   Chief Complaint: palpitations  HPI: Caleb Wolf is a 76 y.o. male with a PMH of CAD s/p CABG in 7989, systolic CHF EF 21-19%, carotid artery disease s/p CEA, hypertension, GERD, chronic back pain and other past medical history detailed below who presented to the ED with the patient was seen today by GI for a scheduled routine follow-up appointment.  He was noted at that time to have an elevated heart rate up to 150-160.  His heart rate was noted to be irregular.  He reported that he woke up at around 6 AM with a feeling of heart palpitations.  He was immediately transferred to the emergency department where he was found to be in rapid atrial fibrillation with RVR.  He was seen by cardiology.  He has been started on an IV diltiazem drip.  He has also been started on IV amiodarone.  Hospitalist was asked to admit the patient for further inpatient treatment.  Review of Systems: All systems reviewed and apart from history of presenting illness, are negative.  Past Medical History:  Diagnosis Date  . Arthritis   . Basal cell carcinoma   . Carotid stenosis    07/04/11- 50-70%bilaterally- stable per Dr Willey Blade note  . Chronic back pain   . Coronary artery disease   . Dysrhythmia   . Enlarged prostate    takes Finasteride daily  . GERD (gastroesophageal reflux disease)   . Gout    takes Indocin daily as needed  . History of colon polyps   . History of kidney stones   . Hyperlipidemia    takes Simvastatin daily  . Hypertension   . Imbalance   . Joint pain   . Lower leg edema   . Lumbar disc disease   . Myocardial infarction ([place])    2015  . PONV (postoperative nausea and vomiting)    also hard to urinate after surgery  . Sleep apnea    uses CPAP  . Urinary urgency   . Wears glasses    Past Surgical History:  Procedure Laterality Date  . BACK  SURGERY    . CARDIAC CATHETERIZATION  05/22/2014   Procedure: IABP INSERTION;  Surgeon: Leonie Man, MD;  Location: [facility] CATH LAB;  Service: Cardiovascular;;  . cataract surgery Right   . COLONOSCOPY    . CORONARY ARTERY BYPASS GRAFT N/A 05/22/2014   Procedure: CORONARY ARTERY BYPASS GRAFTING (CABG) times three using left internal mammary and right saphenous vein.;  Surgeon: Melrose Nakayama, MD;  Location: [place];  Service: Open Heart Surgery;  Laterality: N/A;  . ENDARTERECTOMY Left 02/17/2014   Procedure: ENDARTERECTOMY CAROTID WITH PATCH ANGIOPLASTY;  Surgeon: Mal Misty, MD;  Location: [place];  Service: Vascular;  Laterality: Left;  . ESOPHAGEAL DILATION N/A 08/22/2015   Procedure: ESOPHAGEAL DILATION;  Surgeon: Rogene Houston, MD;  Location: [facility];  Service: Endoscopy;  Laterality: N/A;  . ESOPHAGOGASTRODUODENOSCOPY N/A 08/22/2015   Procedure: ESOPHAGOGASTRODUODENOSCOPY (EGD);  Surgeon: Rogene Houston, MD;  Location: [facility];  Service: Endoscopy;  Laterality: N/A;  12:45 - moved to 1:55 - Ann notified pt  . EYE SURGERY     cataract extraction (right) with repair macular tear , with IOL     right  . FRACTURE SURGERY     bilateral wrist fractures- one ORIF  . JOINT REPLACEMENT  2011   left knee  . LEFT HEART CATHETERIZATION WITH CORONARY ANGIOGRAM N/A 05/22/2014   Procedure: LEFT HEART CATHETERIZATION WITH CORONARY ANGIOGRAM;  Surgeon: Leonie Man, MD;  Location: [facility] CATH LAB;  Service: Cardiovascular;  Laterality: N/A;  . LITHOTRIPSY    . PARS PLANA VITRECTOMY W/ REPAIR OF MACULAR HOLE    . RHINOPLASTY    . TONSILLECTOMY    . TOTAL HIP ARTHROPLASTY  12/08/2011   Procedure: TOTAL HIP ARTHROPLASTY;  Surgeon: Gearlean Alf, MD;  Location: [facility];  Service: Orthopedics;  Laterality: Right;  . UPPER GI ENDOSCOPY  12/18/2015   Procedure: UPPER GI ENDOSCOPY;  Surgeon: Ralene Ok, MD;  Location: [facility] OR[facility];  Service: General;;  . WRIST SURGERY Right 58yrs ago  .  WRIST SURGERY Left    Social History:  reports that he quit smoking about 34 years ago. His smoking use included cigarettes. He started smoking about 56 years ago. He has a 37.50 pack-year smoking history. he has never used smokeless tobacco. He reports that he does not drink alcohol or use drugs.  Allergies  Allergen Reactions  . Codeine Sulfate Nausea Only    Family History  Problem Relation Age of Onset  . Allergies Mother   . Heart disease Mother   . Hypertension Mother   . Heart disease Father        MI    Prior to Admission medications   Medication Sig Start Date End Date Taking? Authorizing Provider  aspirin 81 MG tablet Take 1 tablet (81 mg total) by mouth daily. 08/23/15  Yes Rehman, Mechele Dawley, MD  atorvastatin (LIPITOR) 40 MG tablet Take 1 tablet (40 mg total) by mouth every evening. 05/15/16  Yes Herminio Commons, MD  colchicine 0.6 MG tablet Take 0.6 mg by mouth as needed (gout).    Yes [provider]  cyclobenzaprine (FLEXERIL) 10 MG tablet TAKE 1 TABLET BY MOUTH EVERYDAY AT BEDTIME (STOP METHOCARBAMOL) 02/16/17  Yes [provider]  doxazosin (CARDURA) 2 MG tablet Take 1 tablet (2 mg total) by mouth daily. Patient taking differently: Take 4 mg by mouth daily.  05/15/16  Yes Herminio Commons, MD  finasteride (PROSCAR) 5 MG tablet Take 5 mg by mouth at bedtime.    Yes [provider]  furosemide (LASIX) 40 MG tablet Take 1 tablet (40 mg total) by mouth as directed. 2 times per wk. 05/15/16  Yes Herminio Commons, MD  hydrochlorothiazide (HYDRODIURIL) 25 MG tablet Take 25 mg by mouth daily.   Yes [provider]  lisinopril (PRINIVIL,ZESTRIL) 5 MG tablet Take 1 tablet (5 mg total) by mouth daily. Patient taking differently: Take 40 mg by mouth daily.  05/15/16  Yes Herminio Commons, MD  metoprolol tartrate (LOPRESSOR) 25 MG tablet Take 12.5 mg by mouth at bedtime. 03/05/17  Yes [provider]  nitroGLYCERIN  (NITROSTAT) 0.4 MG SL tablet Place 1 tablet (0.4 mg total) under the tongue every 5 (five) minutes as needed for chest pain. 05/15/16  Yes Herminio Commons, MD  pantoprazole (PROTONIX) 40 MG tablet Take 1 tablet (40 mg total) by mouth daily before breakfast. Patient taking differently: Take 40 mg by mouth at bedtime.  02/19/16  Yes Rehman, Mechele Dawley, MD  potassium chloride SA (K-DUR,KLOR-CON) 20 MEQ tablet Take 1 tablet (20 mEq total) by mouth as directed. 2 times per wk. 05/15/16  Yes Herminio Commons, MD   Physical Exam: Vitals:   04/28/17 1112 04/28/17 1130 04/28/17 1153  04/28/17 1218  BP: 132/87 (!) 130/55 109/63   Pulse: (!) 135 98 (!) 107 (!) 55  Resp: 18 15 17 15   Temp: (!) 97.5 F (36.4 C)     TempSrc: Oral     SpO2: 99% 99% 98% 97%  Weight:      Height:         General exam: Moderately built and nourished patient, lying comfortably supine on the gurney in no obvious distress.  Head, eyes and ENT: Nontraumatic and normocephalic. Pupils equally reacting to light and accommodation. Oral mucosa moist.  Neck: Supple. No JVD, carotid bruit or thyromegaly.  Lymphatics: No lymphadenopathy.  Respiratory system: Clear to auscultation. No increased work of breathing.  Cardiovascular system: S1 and S2 heard, irregularly irregular. No JVD, murmurs, gallops, clicks or He does have bilateral LE edema.  Gastrointestinal system: Abdomen is nondistended, soft and nontender. Normal bowel sounds heard. No organomegaly or masses appreciated.  Central nervous system: Alert and oriented. No focal neurological deficits.  Extremities: Symmetric 5 x 5 power. Peripheral pulses symmetrically felt. 1-2+ edema BLEs.  Skin: No rashes or acute findings.  Musculoskeletal system: Negative exam.  Psychiatry: Pleasant and cooperative.  Labs on Admission:  Basic Metabolic Panel: Recent Labs  Lab 04/28/17 1055  NA 138  K 4.3  CL 103  CO2 28  GLUCOSE 111*  BUN 23*  CREATININE 1.29*    CALCIUM 9.3   Liver Function Tests: Recent Labs  Lab 04/28/17 1055  AST 18  ALT 20  ALKPHOS 80  BILITOT 0.6  PROT 6.9  ALBUMIN 4.3   No results for input(s): LIPASE, AMYLASE in the last 168 hours. No results for input(s): AMMONIA in the last 168 hours. CBC: Recent Labs  Lab 04/28/17 1055  WBC 7.2  NEUTROABS 4.4  HGB 13.4  HCT 42.4  MCV 97.2  PLT 177   Cardiac Enzymes: No results for input(s): CKTOTAL, CKMB, CKMBINDEX, TROPONINI in the last 168 hours.  BNP (last 3 results) No results for input(s): PROBNP in the last 8760 hours. CBG: No results for input(s): GLUCAP in the last 168 hours.  Radiological Exams on Admission: Dg Chest Portable 1 View  Result Date: 04/28/2017 CLINICAL DATA:  Tachycardia. EXAM: PORTABLE CHEST 1 VIEW COMPARISON:  12/12/2015 FINDINGS: Stable postsurgical changes from CABG. The cardiac silhouette is enlarged. Tortuosity and calcific atherosclerotic disease of the aorta. There is no evidence of focal airspace consolidation, pleural effusion or pneumothorax. Osseous structures are without acute abnormality. Soft tissues are grossly normal. IMPRESSION: Calcific atherosclerotic disease of the aorta. Enlarged cardiac silhouette. No evidence of lobar consolidation or pulmonary edema. Electronically Signed   By: Fidela Salisbury [name]   On: 04/28/2017 11:27    EKG: Independently reviewed.   Assessment/Plan Principal Problem:   Atrial fibrillation with RVR (HCC) Active Problems:   Difficulty walking   OSA (obstructive sleep apnea)   Edema   HTN (hypertension)   Abnormal EKG   Carotid artery stenosis without cerebral infarction   S/P CABG x 3   Achalasia   GERD (gastroesophageal reflux disease)   Systolic CHF EF 54-27%   AKI (acute kidney injury) ([place])   1. Atrial fibrillation with RVR- cardiology has started the patient on IV diltiazem and IV amiodarone.  He is being admitted to the stepdown unit for further monitoring.  In addition, they  will make further evaluation in the morning and determine if he needs further intervention.  Ordered an echocardiogram.  Check a TSH level.  Repeat EKG  in the morning. 2. Chronic systolic heart failure-repeat echocardiogram ordered.  He appears to be compensated at this time and not in acute exacerbation. 3. Acute kidney injury- I did not find her recent BMP to check renal function.  In 2017 he did have a normal renal function.  I think he may be mildly dehydrated.  We have given him some gentle IV fluids for a few hours and will discontinue.  Recheck BMP in the morning.  He does have several reasons to have chronic kidney disease. 4. Coronary artery disease- stable, no chest pain symptoms, no shortness of breath symptoms.  He is on metoprolol, aspirin, statin, ACE inhibitor. 5. GERD-continue Protonix daily. 6. OSA-we will offer nightly CPAP.  DVT Prophylaxis: IV heparin Code Status: Full   Family Communication: bedside  Disposition Plan: home in 3 days   Time spent: 87 mins  Irwin Brakeman, MD [facility] Pager [phone]  If 7PM-7AM, please contact night-coverage [email or web address] Password [facility] 04/28/2017, 12:54 PM

## 2017-04-28 NOTE — Patient Instructions (Addendum)
Patient was was taken to the emergency room in a wheelchair. Plan EGD and colonoscopy middle of next year.

## 2017-04-28 NOTE — ED Notes (Signed)
MD at bedside. 

## 2017-04-28 NOTE — ED Notes (Signed)
Report given to [place], [place] ICU RN.

## 2017-04-28 NOTE — ED Notes (Signed)
Echo notified echo was not completed, accidentally marked completed.

## 2017-04-28 NOTE — Consult Note (Signed)
CARDIOLOGY CONSULT NOTE    Patient ID: Caleb Wolf; [id number]; 29-Apr-1941   Admit date: 04/28/2017 Date of Consult: 04/28/2017  Primary Care Provider: Asencion Noble, MD Primary Cardiologist: Kate Sable, MD  Patient Profile:   Caleb Wolf is a 76 y.o. male with a hx of coronary artery disease s/p CABG in 2015, postoperrative atrial fibrillation (previously on Coumadin and Amiodarone), cardiomyopathy with LVEF 45% by intraoperative TEE 2015, carotid artery disease s/p CEA, HTN, and HL.  He was last seen by Dr. Bronson Ing on 05/15/2016 and is being seen today for the evaluation of atrial fibrillation with RVR at the request of Dr. Wynetta Emery.  History of Present Illness:   [name] presented to the emergency room with the assistance of Dr.Rehman. He was being seen in his office this a.m. for follow-up appointment concerning GERD and was found to have rapid heart rhythm, during physical exam.  He was in his usual state of health, got up around 6 AM "let the cat out and fell asleep in my chair" he was awakened suddenly with feelings of his heart fluttering in his chest.  States it went away on its own and then returned.  He had a routine appointment with his gastroenterologist this am. During exam was found to have rapid heart rate.  On arrival to the emergency room blood pressure 110/64, heart rate 135 bpm, he was afebrile.  EKG revealed atrial fib with RVR heart rate of 150 bpm with incomplete right bundle branch block noted.  Pertinent labs revealed a potassium of 4.3, creatinine 1.29, sodium 138, the patient was not found to be anemic thrombocytopenic or have leukocytosis.  Initial point-of-care troponin 0.00.  Chest x-ray was negative for CHF, pneumonia, or pulmonary edema.  He was given IV diltiazem bolus of 10 mg, and started on diltiazem drip.  Heart rate remains labile but is getting better controlled range between 99 bpm and 146 bpm.  He remains in atrial fibrillation at this  time.  He denies medical noncompliance, chest pain, dizziness, dyspnea.  He is not had any recent illnesses.   Past Medical History:  Diagnosis Date  . Arthritis   . Basal cell carcinoma   . Carotid stenosis   . Chronic back pain   . Coronary artery disease    Multvessel s/p CABG 2015  . Enlarged prostate   . Essential hypertension   . GERD (gastroesophageal reflux disease)   . Gout   . History of colon polyps   . History of kidney stones   . Hyperlipidemia   . Lumbar disc disease   . Myocardial infarction ([place])    2015  . Postoperative atrial fibrillation ([place])   . Sleep apnea    CPAP  . Urinary urgency   . Wears glasses     Past Surgical History:  Procedure Laterality Date  . BACK SURGERY    . CARDIAC CATHETERIZATION  05/22/2014   Procedure: IABP INSERTION;  Surgeon: Leonie Man, MD;  Location: [facility] CATH LAB;  Service: Cardiovascular;;  . Cataract surgery Right   . COLONOSCOPY    . CORONARY ARTERY BYPASS GRAFT N/A 05/22/2014   Procedure: CORONARY ARTERY BYPASS GRAFTING (CABG) times three using left internal mammary and right saphenous vein.;  Surgeon: Melrose Nakayama, MD;  Location: [facility];  Service: Open Heart Surgery;  Laterality: N/A;  . ENDARTERECTOMY Left 02/17/2014   Procedure: ENDARTERECTOMY CAROTID WITH PATCH ANGIOPLASTY;  Surgeon: Mal Misty, MD;  Location: [facility];  Service: Vascular;  Laterality: Left;  . ESOPHAGEAL DILATION N/A 08/22/2015   Procedure: ESOPHAGEAL DILATION;  Surgeon: Rogene Houston, MD;  Location: [facility] SU[facility];  Service: Endoscopy;  Laterality: N/A;  . ESOPHAGOGASTRODUODENOSCOPY N/A 08/22/2015   Procedure: ESOPHAGOGASTRODUODENOSCOPY (EGD);  Surgeon: Rogene Houston, MD;  Location: [facility];  Service: Endoscopy;  Laterality: N/A;  12:45 - moved to 1:55 - Ann notified pt  . EYE SURGERY     cataract extraction (right) with repair macular tear , with IOL     right  . FRACTURE SURGERY     bilateral wrist fractures- one ORIF  . JOINT  REPLACEMENT  2011   left knee  . LEFT HEART CATHETERIZATION WITH CORONARY ANGIOGRAM N/A 05/22/2014   Procedure: LEFT HEART CATHETERIZATION WITH CORONARY ANGIOGRAM;  Surgeon: Leonie Man, MD;  Location: [facility] CATH LAB;  Service: Cardiovascular;  Laterality: N/A;  . LITHOTRIPSY    . PARS PLANA VITRECTOMY W/ REPAIR OF MACULAR HOLE    . RHINOPLASTY    . TONSILLECTOMY    . TOTAL HIP ARTHROPLASTY  12/08/2011   Procedure: TOTAL HIP ARTHROPLASTY;  Surgeon: Gearlean Alf, MD;  Location: [facility] ORS;  Service: Orthopedics;  Laterality: Right;  . UPPER GI ENDOSCOPY  12/18/2015   Procedure: UPPER GI ENDOSCOPY;  Surgeon: Ralene Ok, MD;  Location: [facility] ORS;  Service: General;;  . WRIST SURGERY Right 40yrs ago  . WRIST SURGERY Left      Home Medications:  Prior to Admission medications   Medication Sig Start Date End Date Taking? Authorizing Provider  aspirin 81 MG tablet Take 1 tablet (81 mg total) by mouth daily. 08/23/15  Yes Rehman, Mechele Dawley, MD  atorvastatin (LIPITOR) 40 MG tablet Take 1 tablet (40 mg total) by mouth every evening. 05/15/16  Yes Herminio Commons, MD  colchicine 0.6 MG tablet Take 0.6 mg by mouth as needed (gout).    Yes [provider]  cyclobenzaprine (FLEXERIL) 10 MG tablet TAKE 1 TABLET BY MOUTH EVERYDAY AT BEDTIME (STOP METHOCARBAMOL) 02/16/17  Yes [provider]  doxazosin (CARDURA) 2 MG tablet Take 1 tablet (2 mg total) by mouth daily. Patient taking differently: Take 4 mg by mouth daily.  05/15/16  Yes Herminio Commons, MD  finasteride (PROSCAR) 5 MG tablet Take 5 mg by mouth at bedtime.    Yes [provider]  furosemide (LASIX) 40 MG tablet Take 1 tablet (40 mg total) by mouth as directed. 2 times per wk. 05/15/16  Yes Herminio Commons, MD  hydrochlorothiazide (HYDRODIURIL) 25 MG tablet Take 25 mg by mouth daily.   Yes [provider]  lisinopril (PRINIVIL,ZESTRIL) 5 MG tablet Take 1 tablet (5 mg total) by mouth daily. Patient  taking differently: Take 40 mg by mouth daily.  05/15/16  Yes Herminio Commons, MD  metoprolol tartrate (LOPRESSOR) 25 MG tablet Take 12.5 mg by mouth at bedtime. 03/05/17  Yes [provider]  nitroGLYCERIN (NITROSTAT) 0.4 MG SL tablet Place 1 tablet (0.4 mg total) under the tongue every 5 (five) minutes as needed for chest pain. 05/15/16  Yes Herminio Commons, MD  pantoprazole (PROTONIX) 40 MG tablet Take 1 tablet (40 mg total) by mouth daily before breakfast. Patient taking differently: Take 40 mg by mouth at bedtime.  02/19/16  Yes Rehman, Mechele Dawley, MD  potassium chloride SA (K-DUR,KLOR-CON) 20 MEQ tablet Take 1 tablet (20 mEq total) by mouth as directed. 2 times per wk. 05/15/16  Yes Herminio Commons,  MD    Home Medications: No current facility-administered medications on file prior to encounter.    Current Outpatient Medications on File Prior to Encounter  Medication Sig Dispense Refill  . aspirin 81 MG tablet Take 1 tablet (81 mg total) by mouth daily. 30 tablet   . atorvastatin (LIPITOR) 40 MG tablet Take 1 tablet (40 mg total) by mouth every evening. 90 tablet 3  . colchicine 0.6 MG tablet Take 0.6 mg by mouth as needed (gout).     . cyclobenzaprine (FLEXERIL) 10 MG tablet TAKE 1 TABLET BY MOUTH EVERYDAY AT BEDTIME (STOP METHOCARBAMOL)  1  . doxazosin (CARDURA) 2 MG tablet Take 1 tablet (2 mg total) by mouth daily. (Patient taking differently: Take 4 mg by mouth daily. ) 90 tablet 3  . finasteride (PROSCAR) 5 MG tablet Take 5 mg by mouth at bedtime.     . furosemide (LASIX) 40 MG tablet Take 1 tablet (40 mg total) by mouth as directed. 2 times per wk. 90 tablet 3  . hydrochlorothiazide (HYDRODIURIL) 25 MG tablet Take 25 mg by mouth daily.    Marland Kitchen lisinopril (PRINIVIL,ZESTRIL) 5 MG tablet Take 1 tablet (5 mg total) by mouth daily. (Patient taking differently: Take 40 mg by mouth daily. ) 90 tablet 2  . metoprolol tartrate (LOPRESSOR) 25 MG tablet Take 12.5 mg by mouth at  bedtime.  2  . nitroGLYCERIN (NITROSTAT) 0.4 MG SL tablet Place 1 tablet (0.4 mg total) under the tongue every 5 (five) minutes as needed for chest pain. 25 tablet 3  . pantoprazole (PROTONIX) 40 MG tablet Take 1 tablet (40 mg total) by mouth daily before breakfast. (Patient taking differently: Take 40 mg by mouth at bedtime. ) 90 tablet 3  . potassium chloride SA (K-DUR,KLOR-CON) 20 MEQ tablet Take 1 tablet (20 mEq total) by mouth as directed. 2 times per wk. 90 tablet 3    Allergies:    Allergies  Allergen Reactions  . Codeine Sulfate Nausea Only    Social History:   Social History   Socioeconomic History  . Marital status: Married    Spouse name: Not on file  . Number of children: Not on file  . Years of education: Not on file  . Highest education level: Not on file  Social Needs  . Financial resource strain: Not on file  . Food insecurity - worry: Not on file  . Food insecurity - inability: Not on file  . Transportation needs - medical: Not on file  . Transportation needs - non-medical: Not on file  Occupational History  . Occupation: retired    Comment: [place] [employer]  Tobacco Use  . Smoking status: Former Smoker    Packs/day: 1.50    Years: 25.00    Pack years: 37.50    Types: Cigarettes    Start date: 08/08/1960    Last attempt to quit: 06/02/1982    Years since quitting: 34.9  . Smokeless tobacco: Never Used  . Tobacco comment: quit smoking 30+yrs ago  Substance and Sexual Activity  . Alcohol use: No    Alcohol/week: 0.0 oz    Comment: occasionally   . Drug use: No  . Sexual activity: Yes  Other Topics Concern  . Not on file  Social History Narrative  . Not on file    Family History:    Family History  Problem Relation Age of Onset  . Allergies Mother   . Heart disease Mother   . Hypertension Mother   .  Heart disease Father        MI     ROS:  Please see the history of present illness.  ROS  All other ROS reviewed and negative.      Physical Exam/Data:   Vitals:   04/28/17 1218 04/28/17 1300 04/28/17 1411 04/28/17 1430  BP:  116/64 118/73 127/62  Pulse: (!) 55 (!) 50 (!) 128 90  Resp: 15 (!) 21 17 18   Temp:   (!) 97.4 F (36.3 C)   TempSrc:   Oral   SpO2: 97% 94% 97% 99%  Weight:      Height:        Intake/Output Summary (Last 24 hours) at 04/28/2017 1512 Last data filed at 04/28/2017 1217 Gross per 24 hour  Intake -  Output 750 ml  Net -750 ml   Filed Weights   04/28/17 1059  Weight: 240 lb (108.9 kg)   Body mass index is 34.44 kg/m.   General:  Overweight male, in no acute distress HEENT: normal Lymph: no adenopathy Neck: no JVD Endocrine:  No thryomegaly Vascular: No carotid bruits; FA pulses 2+ bilaterally without bruits  Cardiac:  IRRR; no murmur  Lungs:  Clear to auscultation bilaterally, no wheezing, rhonchi or rales  Abd: soft, nontender, no hepatomegaly  Ext: Bilateral lower leg edema noted Musculoskeletal:  No deformities, BUE and BLE strength normal and equal Skin: warm and dry  Neuro:  CNs 2-12 intact, no focal abnormalities noted Psych:  Normal affect   EKG:  The EKG was personally reviewed and demonstrates: Atrial fib with RVR, heart rate of 150 bpm.  Telemetry:  Telemetry was personally reviewed and demonstrates: Atrial fibrillation, heart rate ranged between 97 bpm and 141 bpm.  Relevant CV Studies: TEE: 05/22/2014 Left ventricle: Systolic function was mildly to moderately reduced. The estimated ejection fraction was in the range of 40% to 45%. Global hypokinesis, more pronounced in the septal myocardium. - Staged echo: Limited post CPB exam: Improved septal contractility, with improved LVEF . No change post bypass in aortic valve function. No change post bypass in mitral valve function. The initial MR present on separation from CPB resolved with time off of pump.  Cardiac Catheterization 05/22/2014  FINDINGS:  Hemodynamics:   Central Aortic  Pressure / Mean:  125/63/87  mmHg  Left Ventricular Pressure / LVEDP: 127/24/34 mmHg  Left Ventriculography: Not assessed; EF was 50-65% by echocardiogram in September  Coronary Anatomy:  Dominance: Left  Left Main: Heavily calcified ostial left main with roughly 80% stenosis. There is pressure dampening 5 [place] catheter engagement. The distal left main is relatively free of disease it bifurcates into the LAD and circumflex. LAD: Large-caliber vessel with a proximal 99% stenosis at the takeoff of a small first diagonal branch (D1) that is also involved with a 90% stenosis. The remainder the LAD is relatively free of disease in which is down around the apex perfusing the distal inferoapex. There is a major mid vessel diagonal branch (D2) that is relatively free of disease.  Left Circumflex: Large-caliber, dominant vessel. There is ostial 60% stenosis. The vessel then bifurcates in the AV groove into a large lateral OM branch which has a 70-80% ostial stenosis. The AV groove circumflex continues on to give rise to a left posterior lateral and left posterior descending artery.  OM1: Large-caliber vessel, as noted ostial 70-80% stenosis. The rest the vessels. Disease. Bifurcates distally.   RCA: Small caliber, nondominant vessel which basically courses to give 2 marginal branches.  After  reviewing the initial angiography, the culprit lesion was thought to be accommodation of the ostial Left Main 80% and proximal LAD 99% lesion.Marland Kitchen  Preparation were made to Place an Intra-Aortic Balloon Pump for ongoing anginal chest pain with high-grade disease. [place] surgery consultation was obtained. The patient will require urgent CABG.  Intra-Aortic Balloon Pump Placement  Right common femoral access as above. 6 French sheath initially placed was exchanged for the 7.5 French IABP sheath.  40 cm IABP was advanced over the 0.25 mm wire into the aortic knob.  It was visualized angiographically and all  lines were flushed.  The balloon pump was initiated at one to one ratio using pressure tracing.  CABG 05/22/2014  Left internal mammary artery to left anterior descending  Sequential saphenous vein graft to obtuse marginals 1 and 2  Endoscopic vein harvest right thigh  Laboratory Data:  Chemistry Recent Labs  Lab 04/28/17 1055  NA 138  K 4.3  CL 103  CO2 28  GLUCOSE 111*  BUN 23*  CREATININE 1.29*  CALCIUM 9.3  GFRNONAA 53*  GFRAA >60  ANIONGAP 7    Recent Labs  Lab 04/28/17 1055  PROT 6.9  ALBUMIN 4.3  AST 18  ALT 20  ALKPHOS 80  BILITOT 0.6   Hematology Recent Labs  Lab 04/28/17 1055  WBC 7.2  RBC 4.36  HGB 13.4  HCT 42.4  MCV 97.2  MCH 30.7  MCHC 31.6  RDW 13.6  PLT 177   Cardiac EnzymesNo results for input(s): TROPONINI in the last 168 hours.  Recent Labs  Lab 04/28/17 1102  TROPIPOC 0.00    Radiology/Studies:  Dg Chest Portable 1 View  Result Date: 04/28/2017 CLINICAL DATA:  Tachycardia. EXAM: PORTABLE CHEST 1 VIEW COMPARISON:  12/12/2015 FINDINGS: Stable postsurgical changes from CABG. The cardiac silhouette is enlarged. Tortuosity and calcific atherosclerotic disease of the aorta. There is no evidence of focal airspace consolidation, pleural effusion or pneumothorax. Osseous structures are without acute abnormality. Soft tissues are grossly normal. IMPRESSION: Calcific atherosclerotic disease of the aorta. Enlarged cardiac silhouette. No evidence of lobar consolidation or pulmonary edema. Electronically Signed   By: Fidela Salisbury [name]   On: 04/28/2017 11:27    Assessment and Plan:   1.  Atrial fibrillation with RVR: Apparent onset this morning with feeling of palpitations.  He does have a history of postoperative atrial fibrillation after CABG in 2015, no obvious recurrences since then.  He is being transitioned from intravenous diltiazem to intravenous amiodarone and otherwise continued on heparin which was initiated in the ER.  Last  LVEF was 40-45% by intraoperative TEE in 2015.  He has not had a follow-up echocardiogram.  Will repeat echocardiogram once HR is better controlled.  CHADS VASC Score of 4. Will need to then consider transitioning to [place].  2.  History of coronary artery disease: Status post coronary artery bypass grafting in 2015, LIMA to LAD, SVG to OM1 and OM 2.  Remains on metoprolol 12.5 mg at at bedtime, aspirin, statin therapy, ACE inhibitor with lisinopril 5 mg daily.  No recent angina symptoms and troponin I negative.  3.  Systolic dysfunction: No evidence of CHF on exam although he does have some mild lower extremity edema bilaterally.  Of note, intraoperative TEE in 2015 indicated LVEF 40-45% range, however it was 55-60% by transthoracic echocardiogram done during that hospitalization.  He has not had a repeat evaluation. He is currently on Lasix 40 mg p.o. twice a week.  Once heart rate  is better controlled will repeat echocardiogram for changes in LV function and need to have daily dose of Lasix versus continuing his current regimen.  Continue potassium.  4.  History of GERD: Currently on PPI with Protonix.  5.  Hypertension: On ACE inhibitor, beta-blocker, Cardura.  Medication regimen will need to be adjusted if he will need to be on higher doses of metoprolol to maintain heart rate, and or reinstitution of amiodarone p.o.  2 of labs reveals normal LFTs.  6.  History of carotid artery disease: Status post carotid endarterectomy on the left.  Continue statin therapy.  For questions or updates, please contact [place] Please consult [email or web address] for contact info under Cardiology/STEMI.   Signed, Phill Myron. West Pugh, ANP, [place]  04/28/2017 3:12 PM    Attending note:   Patient seen and examined.  I reviewed extensive records and updated his chart.  Case was discussed with Ms. Lawrence DNP and above note modified. Mr. Mussa presents with recent onset palpitations earlier this morning and  subsequent documentation of rapid atrial fibrillation on ER evaluation.  He has a history of CAD status post CABG in 2015, experienced postoperative atrial fibrillation at that time and was on Coumadin and amiodarone temporarily.  He has had no obvious recurrences since then.  CHADSVASC score is 4.  His intraoperative TEE in 2015 indicated LVEF 40-45%, however transthoracic echocardiogram indicated LVEF 55-60% at that time.  He has not had a subsequent evaluation.  At baseline he does not report any angina symptoms or palpitations.  On examination in the ER he reports no chest pain.  He remains in atrial fibrillation by telemetry which I personally reviewed.  Lungs exhibit diminished breath sounds at the bases.  Cardiac exam reveals irregularly irregular rhythm without gallop.  Lower leg edema is present bilaterally.  Lab work reviewed finding potassium 4.3, BUN 23, creatinine 1.29, AST 18, ALT 20, troponin I 0.0, hemoglobin 13.4, platelets 177.  Chest x-ray reports enlarged cardiac silhouette but no edema or infiltrates.  I personally reviewed his ECG which shows rapid atrial fibrillation with poor R wave progression, rule out old anterior infarct pattern.  Patient presents with rapid atrial fibrillation, apparent onset this morning with symptoms of palpitations.  He has a history of postoperative atrial fibrillation following CABG in 2015, but no obvious recurrences since then.  CHADSVASC score is 4 as noted above.  He is being admitted to the hospitalist service.  Would continue with IV heparin and IV amiodarone as discussed above.  If he does not convert on this regimen, we will plan a TEE guided cardioversion for tomorrow.  He can then be transitioned to Blue such as Xarelto or Eliquis.  He will need a follow-up transthoracic echocardiogram once rhythm is stabilized for reassessment of LVEF.  Not entirely clear that he will need to be on standing antiarrhythmics at this point however.  Satira Sark, [name], F.A.C.C.

## 2017-04-28 NOTE — ED Provider Notes (Signed)
[facility] EMERGENCY DEPARTMENT Provider Note   CSN: [id number] Arrival date & time: 04/28/17  1058     History   Chief Complaint Chief Complaint  Patient presents with  . Tachycardia    HPI Caleb Wolf is a 76 y.o. male.  Patient states that he awoke with palpitations and went to his gastroenterologist office today.  His gastroenterologist noticed of the patient's heart rate was 150 and took him to the emergency department    Palpitations   This is a new problem. The current episode started 12 to 24 hours ago. The problem occurs constantly. The problem has not changed since onset.Associated with: Unknown. Pertinent negatives include no diaphoresis, no chest pain, no abdominal pain, no headaches, no back pain and no cough. He has tried nothing for the symptoms. The treatment provided no relief. Risk factors: Coronary artery disease.    Past Medical History:  Diagnosis Date  . Arthritis   . Basal cell carcinoma   . Carotid stenosis    07/04/11- 50-70%bilaterally- stable per Dr Willey Blade note  . Chronic back pain   . Coronary artery disease   . Dysrhythmia   . Enlarged prostate    takes Finasteride daily  . GERD (gastroesophageal reflux disease)   . Gout    takes Indocin daily as needed  . History of colon polyps   . History of kidney stones   . Hyperlipidemia    takes Simvastatin daily  . Hypertension   . Imbalance   . Joint pain   . Lower leg edema   . Lumbar disc disease   . Myocardial infarction ([place])    2015  . PONV (postoperative nausea and vomiting)    also hard to urinate after surgery  . Sleep apnea    uses CPAP  . Urinary urgency   . Wears glasses     Patient Active Problem List   Diagnosis Date Noted  . GERD (gastroesophageal reflux disease) 04/28/2017  . Systolic CHF EF 09-73% 53/29/9242  . AKI (acute kidney injury) ([place]) 04/28/2017  . Achalasia 12/18/2015  . Encounter for therapeutic drug monitoring 05/31/2014  . Atrial fibrillation with RVR  ([place]) 05/22/2014  . 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 Abnormal EKG   . NSTEMI (non-ST elevated myocardial infarction) ([place])   . Carotid artery stenosis without cerebral infarction   . Chest pain 02/09/2014  . HTN (hypertension) 02/09/2014  . Carotid stenosis 08/02/2013  . Edema 08/02/2013  . OSA (obstructive sleep apnea) 09/21/2012  . Difficulty walking 01/29/2012  . Balance disorder 01/29/2012  . OA (osteoarthritis) of hip 12/08/2011    Past Surgical History:  Procedure Laterality Date  . BACK SURGERY    . CARDIAC CATHETERIZATION  05/22/2014   Procedure: IABP INSERTION;  Surgeon: Leonie Man, MD;  Location: [facility] CATH LAB;  Service: Cardiovascular;;  . cataract surgery Right   . COLONOSCOPY    . CORONARY ARTERY BYPASS GRAFT N/A 05/22/2014   Procedure: CORONARY ARTERY BYPASS GRAFTING (CABG) times three using left internal mammary and right saphenous vein.;  Surgeon: Melrose Nakayama, MD;  Location: [place];  Service: Open Heart Surgery;  Laterality: N/A;  . ENDARTERECTOMY Left 02/17/2014   Procedure: ENDARTERECTOMY CAROTID WITH PATCH ANGIOPLASTY;  Surgeon: Mal Misty, MD;  Location: [place];  Service: Vascular;  Laterality: Left;  . ESOPHAGEAL DILATION N/A 08/22/2015   Procedure: ESOPHAGEAL DILATION;  Surgeon: Rogene Houston, MD;  Location: [facility];  Service: Endoscopy;  Laterality: N/A;  .  ESOPHAGOGASTRODUODENOSCOPY N/A 08/22/2015   Procedure: ESOPHAGOGASTRODUODENOSCOPY (EGD);  Surgeon: Rogene Houston, MD;  Location: [facility];  Service: Endoscopy;  Laterality: N/A;  12:45 - moved to 1:55 - Ann notified pt  . EYE SURGERY     cataract extraction (right) with repair macular tear , with IOL     right  . FRACTURE SURGERY     bilateral wrist fractures- one ORIF  . JOINT REPLACEMENT  2011   left knee  . LEFT HEART CATHETERIZATION WITH CORONARY ANGIOGRAM N/A 05/22/2014   Procedure: LEFT HEART CATHETERIZATION WITH CORONARY ANGIOGRAM;  Surgeon: Leonie Man, MD;  Location: [facility]  CATH LAB;  Service: Cardiovascular;  Laterality: N/A;  . LITHOTRIPSY    . PARS PLANA VITRECTOMY W/ REPAIR OF MACULAR HOLE    . RHINOPLASTY    . TONSILLECTOMY    . TOTAL HIP ARTHROPLASTY  12/08/2011   Procedure: TOTAL HIP ARTHROPLASTY;  Surgeon: Gearlean Alf, MD;  Location: [facility];  Service: Orthopedics;  Laterality: Right;  . UPPER GI ENDOSCOPY  12/18/2015   Procedure: UPPER GI ENDOSCOPY;  Surgeon: Ralene Ok, MD;  Location: [facility] OR[facility];  Service: General;;  . WRIST SURGERY Right 32yrs ago  . WRIST SURGERY Left        Home Medications    Prior to Admission medications   Medication Sig Start Date End Date Taking? Authorizing Provider  aspirin 81 MG tablet Take 1 tablet (81 mg total) by mouth daily. 08/23/15  Yes Rehman, Mechele Dawley, MD  atorvastatin (LIPITOR) 40 MG tablet Take 1 tablet (40 mg total) by mouth every evening. 05/15/16  Yes Herminio Commons, MD  colchicine 0.6 MG tablet Take 0.6 mg by mouth as needed (gout).    Yes [provider]  cyclobenzaprine (FLEXERIL) 10 MG tablet TAKE 1 TABLET BY MOUTH EVERYDAY AT BEDTIME (STOP METHOCARBAMOL) 02/16/17  Yes [provider]  doxazosin (CARDURA) 2 MG tablet Take 1 tablet (2 mg total) by mouth daily. Patient taking differently: Take 4 mg by mouth daily.  05/15/16  Yes Herminio Commons, MD  finasteride (PROSCAR) 5 MG tablet Take 5 mg by mouth at bedtime.    Yes [provider]  furosemide (LASIX) 40 MG tablet Take 1 tablet (40 mg total) by mouth as directed. 2 times per wk. 05/15/16  Yes Herminio Commons, MD  hydrochlorothiazide (HYDRODIURIL) 25 MG tablet Take 25 mg by mouth daily.   Yes [provider]  lisinopril (PRINIVIL,ZESTRIL) 5 MG tablet Take 1 tablet (5 mg total) by mouth daily. Patient taking differently: Take 40 mg by mouth daily.  05/15/16  Yes Herminio Commons, MD  metoprolol tartrate (LOPRESSOR) 25 MG tablet Take 12.5 mg by mouth at bedtime. 03/05/17  Yes [provider]  nitroGLYCERIN (NITROSTAT) 0.4 MG SL tablet Place 1 tablet (0.4 mg total) under the tongue every 5 (five) minutes as needed for chest pain. 05/15/16  Yes Herminio Commons, MD  pantoprazole (PROTONIX) 40 MG tablet Take 1 tablet (40 mg total) by mouth daily before breakfast. Patient taking differently: Take 40 mg by mouth at bedtime.  02/19/16  Yes Rehman, Mechele Dawley, MD  potassium chloride SA (K-DUR,KLOR-CON) 20 MEQ tablet Take 1 tablet (20 mEq total) by mouth as directed. 2 times per wk. 05/15/16  Yes Herminio Commons, MD    Family History Family History  Problem Relation Age of Onset  . Allergies Mother   . Heart disease Mother   . Hypertension Mother   .  Heart disease Father        MI    Social History Social History   Tobacco Use  . Smoking status: Former Smoker    Packs/day: 1.50    Years: 25.00    Pack years: 37.50    Types: Cigarettes    Start date: 08/08/1960    Last attempt to quit: 06/02/1982    Years since quitting: 34.9  . Smokeless tobacco: Never Used  . Tobacco comment: quit smoking 30+yrs ago  Substance Use Topics  . Alcohol use: No    Alcohol/week: 0.0 oz    Comment: occasionally   . Drug use: No     Allergies   Codeine sulfate   Review of Systems Review of Systems  Constitutional: Negative for appetite change, diaphoresis and fatigue.  HENT: Negative for congestion, ear discharge and sinus pressure.   Eyes: Negative for discharge.  Respiratory: Negative for cough.   Cardiovascular: Positive for palpitations. Negative for chest pain.  Gastrointestinal: Negative for abdominal pain and diarrhea.  Genitourinary: Negative for frequency and hematuria.  Musculoskeletal: Negative for back pain.  Skin: Negative for rash.  Neurological: Negative for seizures and headaches.  Psychiatric/Behavioral: Negative for hallucinations.     Physical Exam Updated Vital Signs BP 109/63   Pulse (!) 55   Temp (!) 97.5 F (36.4 C) (Oral)   Resp 15   Ht 5'  10" (1.778 m)   Wt 108.9 kg (240 lb)   SpO2 97%   BMI 34.44 kg/m   Physical Exam  Constitutional: He is oriented to person, place, and time. He appears well-developed.  HENT:  Head: Normocephalic.  Eyes: Conjunctivae and EOM are normal. No scleral icterus.  Neck: Neck supple. No thyromegaly present.  Cardiovascular: Exam reveals no gallop and no friction rub.  No murmur heard. Irregular rapid heart rate  Pulmonary/Chest: No stridor. He has no wheezes. He has no rales. He exhibits no tenderness.  Abdominal: He exhibits no distension. There is no tenderness. There is no rebound.  Musculoskeletal: Normal range of motion. He exhibits no edema.  Lymphadenopathy:    He has no cervical adenopathy.  Neurological: He is oriented to person, place, and time. He exhibits normal muscle tone. Coordination normal.  Skin: No rash noted. No erythema.  Psychiatric: He has a normal mood and affect. His behavior is normal.     ED Treatments / Results  Labs (all labs ordered are listed, but only abnormal results are displayed) Labs Reviewed  BASIC METABOLIC PANEL - Abnormal; Notable for the following components:      Result Value   Glucose, Bld 111 (*)    BUN 23 (*)    Creatinine, Ser 1.29 (*)    GFR calc non Af Amer 53 (*)    All other components within normal limits  CBC WITH DIFFERENTIAL/PLATELET  HEPATIC FUNCTION PANEL  I-STAT TROPONIN, ED    EKG  EKG Interpretation None       Radiology Dg Chest Portable 1 View  Result Date: 04/28/2017 CLINICAL DATA:  Tachycardia. EXAM: PORTABLE CHEST 1 VIEW COMPARISON:  12/12/2015 FINDINGS: Stable postsurgical changes from CABG. The cardiac silhouette is enlarged. Tortuosity and calcific atherosclerotic disease of the aorta. There is no evidence of focal airspace consolidation, pleural effusion or pneumothorax. Osseous structures are without acute abnormality. Soft tissues are grossly normal. IMPRESSION: Calcific atherosclerotic disease of the  aorta. Enlarged cardiac silhouette. No evidence of lobar consolidation or pulmonary edema. Electronically Signed   By: Linwood Dibbles.D.  On: 04/28/2017 11:27    Procedures Procedures (including critical care time)  Medications Ordered in ED Medications  heparin bolus via infusion 4,000 Units (not administered)  heparin ADULT infusion 100 units/mL (25000 units/235mL sodium chloride 0.45%) (not administered)  diltiazem (CARDIZEM) injection 10 mg (10 mg Intravenous Given 04/28/17 1125)  diltiazem (CARDIZEM) 100 mg in dextrose 5% 159mL (1 mg/mL) infusion (7.5 mg/hr Intravenous Rate/Dose Change 04/28/17 1219)     Initial Impression / Assessment and Plan / ED Course  I have reviewed the triage vital signs and the nursing notes.  Pertinent labs & imaging results that were available during my care of the patient were reviewed by me and considered in my medical decision making (see chart for details). CRITICAL CARE Performed by: Milton Ferguson Total critical care time: 35 minutes Critical care time was exclusive of separately billable procedures and treating other patients. Critical care was necessary to treat or prevent imminent or life-threatening deterioration. Critical care was time spent personally by me on the following activities: development of treatment plan with patient and/or surrogate as well as nursing, discussions with consultants, evaluation of patient's response to treatment, examination of patient, obtaining history from patient or surrogate, ordering and performing treatments and interventions, ordering and review of laboratory studies, ordering and review of radiographic studies, pulse oximetry and re-evaluation of patient's condition.   Patient with rapid atrial fib.  His rate is controlled with IV diltiazem.  Cardiology will consult to medicine will admit   Final Clinical Impressions(s) / ED Diagnoses   Final diagnoses:  Atrial fibrillation with rapid ventricular  response [facility])    ED Discharge Orders    None       Milton Ferguson, MD 04/28/17 1308

## 2017-04-28 NOTE — Telephone Encounter (Signed)
I will send this to  [place] for review.

## 2017-04-28 NOTE — Progress Notes (Addendum)
[place] for heparin Indication: atrial fibrillation  Allergies  Allergen Reactions  . Codeine Sulfate Nausea Only    Patient Measurements: Height: 5\' 10"  (177.8 cm) Weight: 240 lb (108.9 kg) IBW/kg (Calculated) : 73 Heparin Dosing Weight: 96.5  Vital Signs: Temp: 97.5 F (36.4 C) (11/27 1112) Temp Source: Oral (11/27 1112) BP: 109/63 (11/27 1153) Pulse Rate: 55 (11/27 1218)  Labs: Recent Labs    04/28/17 1055  HGB 13.4  HCT 42.4  PLT 177  CREATININE 1.29*    Estimated Creatinine Clearance: 62.1 mL/min (A) (by C-G formula based on SCr of 1.29 mg/dL (H)).   Medications:  See medication history  Assessment: 76 yo man with h/o OHS to start on heparin for afib.  He was not on anticoagulation PTA. Goal of Therapy:  Heparin level 0.3-0.7 units/ml Monitor platelets by anticoagulation protocol: Yes   Plan:  Heparin 4000 unit bolus and drip at 1400 units/hr Check heparin level 6-8 hours after start Daily HL and CBC while on heparin Monitor for bleeding complications  Mistie Adney Poteet 04/28/2017,12:49 PM  Addum:  Initial heparin level therapeutic.  Cont heparin at 1400 units/hr.  F/u am labs

## 2017-04-29 ENCOUNTER — Inpatient Hospital Stay (HOSPITAL_COMMUNITY): Payer: Medicare Other

## 2017-04-29 ENCOUNTER — Inpatient Hospital Stay (HOSPITAL_COMMUNITY): Payer: Medicare Other | Admitting: Anesthesiology

## 2017-04-29 ENCOUNTER — Encounter (HOSPITAL_COMMUNITY)
Admission: EM | Disposition: A | Discharge status: Home / Self Care | Payer: Self-pay | Source: Home / Self Care | Attending: Family Medicine

## 2017-04-29 ENCOUNTER — Encounter (HOSPITAL_COMMUNITY): Payer: Self-pay

## 2017-04-29 ENCOUNTER — Ambulatory Visit: Payer: Medicare Other | Admitting: Physician Assistant

## 2017-04-29 DIAGNOSIS — Z8679 Personal history of other diseases of the circulatory system: Secondary | ICD-10-CM

## 2017-04-29 DIAGNOSIS — I10 Essential (primary) hypertension: Secondary | ICD-10-CM

## 2017-04-29 DIAGNOSIS — I5022 Chronic systolic (congestive) heart failure: Secondary | ICD-10-CM

## 2017-04-29 DIAGNOSIS — I6529 Occlusion and stenosis of unspecified carotid artery: Secondary | ICD-10-CM

## 2017-04-29 DIAGNOSIS — I4891 Unspecified atrial fibrillation: Secondary | ICD-10-CM

## 2017-04-29 DIAGNOSIS — R748 Abnormal levels of other serum enzymes: Secondary | ICD-10-CM

## 2017-04-29 DIAGNOSIS — I340 Nonrheumatic mitral (valve) insufficiency: Secondary | ICD-10-CM

## 2017-04-29 HISTORY — PX: CARDIOVERSION: SHX1299

## 2017-04-29 HISTORY — PX: TEE WITHOUT CARDIOVERSION: SHX5443

## 2017-04-29 LAB — ECHOCARDIOGRAM COMPLETE
Height: 70 in
WEIGHTICAEL: 3760.17 [oz_av]

## 2017-04-29 LAB — COMPREHENSIVE METABOLIC PANEL
ALK PHOS: 72 U/L (ref 38–126)
ALT: 16 U/L — ABNORMAL LOW (ref 17–63)
AST: 15 U/L (ref 15–41)
Albumin: 3.5 g/dL (ref 3.5–5.0)
Anion gap: 7 (ref 5–15)
BILIRUBIN TOTAL: 0.5 mg/dL (ref 0.3–1.2)
BUN: 17 mg/dL (ref 6–20)
CALCIUM: 8.9 mg/dL (ref 8.9–10.3)
CO2: 24 mmol/L (ref 22–32)
CREATININE: 1.13 mg/dL (ref 0.61–1.24)
Chloride: 108 mmol/L (ref 101–111)
Glucose, Bld: 127 mg/dL — ABNORMAL HIGH (ref 65–99)
Potassium: 3.9 mmol/L (ref 3.5–5.1)
Sodium: 139 mmol/L (ref 135–145)
TOTAL PROTEIN: 5.9 g/dL — AB (ref 6.5–8.1)

## 2017-04-29 LAB — BASIC METABOLIC PANEL
Anion gap: 6 (ref 5–15)
BUN: 19 mg/dL (ref 6–20)
CO2: 26 mmol/L (ref 22–32)
CREATININE: 1.12 mg/dL (ref 0.61–1.24)
Calcium: 9 mg/dL (ref 8.9–10.3)
Chloride: 108 mmol/L (ref 101–111)
GFR calc Af Amer: >60 mL/min (ref >60)
Glucose, Bld: 121 mg/dL — ABNORMAL HIGH (ref 65–99)
Potassium: 3.8 mmol/L (ref 3.5–5.1)
SODIUM: 140 mmol/L (ref 135–145)

## 2017-04-29 LAB — BRAIN NATRIURETIC PEPTIDE: B Natriuretic Peptide: 594 pg/mL — ABNORMAL HIGH (ref 0.0–100.0)

## 2017-04-29 LAB — CBC
HCT: 39.7 % (ref 39.0–52.0)
Hemoglobin: 12.6 g/dL — ABNORMAL LOW (ref 13.0–17.0)
MCH: 30.8 pg (ref 26.0–34.0)
MCHC: 31.7 g/dL (ref 30.0–36.0)
MCV: 97.1 fL (ref 78.0–100.0)
PLATELETS: 159 10*3/uL (ref 150–400)
RBC: 4.09 MIL/uL — AB (ref 4.22–5.81)
RDW: 13.8 % (ref 11.5–15.5)
WBC: 5.7 10*3/uL (ref 4.0–10.5)

## 2017-04-29 LAB — TROPONIN I
TROPONIN I: 0.04 ng/mL — AB (ref <0.03)
Troponin I: 0.03 ng/mL (ref <0.03)

## 2017-04-29 LAB — PHOSPHORUS: PHOSPHORUS: 3.3 mg/dL (ref 2.5–4.6)

## 2017-04-29 LAB — MAGNESIUM: Magnesium: 1.8 mg/dL (ref 1.7–2.4)

## 2017-04-29 LAB — HEPARIN LEVEL (UNFRACTIONATED): HEPARIN UNFRACTIONATED: 0.69 [IU]/mL (ref 0.30–0.70)

## 2017-04-29 SURGERY — CARDIOVERSION
Anesthesia: Monitor Anesthesia Care

## 2017-04-29 MED ORDER — ARTIFICIAL TEARS OPHTHALMIC OINT
TOPICAL_OINTMENT | OPHTHALMIC | Status: AC
  Filled 2017-04-29: qty 3.5

## 2017-04-29 MED ORDER — RIVAROXABAN 20 MG PO TABS
20.0000 mg | ORAL_TABLET | Freq: Every day | ORAL | Status: DC
  Administered 2017-04-29 – 2017-04-30 (×2): 20 mg via ORAL
  Filled 2017-04-29 (×2): qty 1

## 2017-04-29 MED ORDER — LIDOCAINE VISCOUS 2 % MT SOLN
15.0000 mL | Freq: Once | OROMUCOSAL | Status: AC
  Administered 2017-04-29: 15 mL via OROMUCOSAL

## 2017-04-29 MED ORDER — FENTANYL CITRATE (PF) 100 MCG/2ML IJ SOLN
INTRAMUSCULAR | Status: AC
  Filled 2017-04-29: qty 2

## 2017-04-29 MED ORDER — MIDAZOLAM HCL 2 MG/2ML IJ SOLN
1.0000 mg | INTRAMUSCULAR | Status: DC
  Administered 2017-04-29: 2 mg via INTRAVENOUS

## 2017-04-29 MED ORDER — FENTANYL CITRATE (PF) 100 MCG/2ML IJ SOLN
25.0000 ug | Freq: Once | INTRAMUSCULAR | Status: AC
  Administered 2017-04-29: 25 ug via INTRAVENOUS

## 2017-04-29 MED ORDER — PROPOFOL 500 MG/50ML IV EMUL
INTRAVENOUS | Status: DC | PRN
  Administered 2017-04-29: 150 ug/kg/min via INTRAVENOUS

## 2017-04-29 MED ORDER — LACTATED RINGERS IV SOLN
INTRAVENOUS | Status: DC
  Administered 2017-04-29: 13:00:00 via INTRAVENOUS

## 2017-04-29 MED ORDER — LIDOCAINE VISCOUS 2 % MT SOLN
OROMUCOSAL | Status: AC
  Filled 2017-04-29: qty 15

## 2017-04-29 MED ORDER — MIDAZOLAM HCL 2 MG/2ML IJ SOLN
INTRAMUSCULAR | Status: AC
  Filled 2017-04-29: qty 2

## 2017-04-29 NOTE — Anesthesia Postprocedure Evaluation (Signed)
Anesthesia Post Note  Patient: Caleb Wolf  Procedure(s) Performed: CARDIOVERSION (N/A ) TRANSESOPHAGEAL ECHOCARDIOGRAM (TEE) WITH PROPOFOL (N/A )  Patient location during evaluation: PACU Anesthesia Type: MAC Level of consciousness: awake and alert, oriented and patient cooperative Pain management: pain level controlled Vital Signs Assessment: post-procedure vital signs reviewed and stable Respiratory status: spontaneous breathing and respiratory function stable Cardiovascular status: stable Postop Assessment: no apparent nausea or vomiting Anesthetic complications: no     Last Vitals:  Vitals:   04/29/17 1410 04/29/17 1415  BP: (!) 100/57 (!) 111/56  Pulse: (!) 57 (!) 57  Resp: 16 17  Temp:    SpO2: 98% 97%    Last Pain:  Vitals:   04/29/17 1410  TempSrc:   PainSc: 0-No pain                 Celisse Ciulla A

## 2017-04-29 NOTE — Anesthesia Preprocedure Evaluation (Signed)
Anesthesia Evaluation  Patient identified by MRN, date of birth, ID band Patient awake    Reviewed: Allergy & Precautions, NPO status , Patient's Chart, lab work & pertinent test results  Airway Mallampati: II  TM Distance: >3 FB Neck ROM: Full    Dental  (+) Teeth Intact   Pulmonary sleep apnea , former smoker,    breath sounds clear to auscultation       Cardiovascular hypertension, Pt. on medications + CAD, + Past MI, + CABG, + Peripheral Vascular Disease and +CHF  + dysrhythmias Atrial Fibrillation  Rhythm:Irregular Rate:Tachycardia     Neuro/Psych    GI/Hepatic GERD  Controlled and Medicated,  Endo/Other    Renal/GU      Musculoskeletal   Abdominal   Peds  Hematology   Anesthesia Other Findings   Reproductive/Obstetrics                             Anesthesia Physical Anesthesia Plan  ASA: III  Anesthesia Plan: MAC   Post-op Pain Management:    Induction: Intravenous  PONV Risk Score and Plan:   Airway Management Planned: Simple Face Mask  Additional Equipment:   Intra-op Plan:   Post-operative Plan:   Informed Consent: I have reviewed the patients History and Physical, chart, labs and discussed the procedure including the risks, benefits and alternatives for the proposed anesthesia with the patient or authorized representative who has indicated his/her understanding and acceptance.     Plan Discussed with:   Anesthesia Plan Comments:         Anesthesia Quick Evaluation

## 2017-04-29 NOTE — Progress Notes (Signed)
[place] for heparin Indication: atrial fibrillation  Allergies  Allergen Reactions  . Codeine Sulfate Nausea Only    Patient Measurements: Height: 5\' 10"  (177.8 cm) Weight: 235 lb 0.2 oz (106.6 kg) IBW/kg (Calculated) : 73 Heparin Dosing Weight: 96.5  Vital Signs: Temp: 98.2 F (36.8 C) (11/28 0752) Temp Source: Oral (11/28 0752) BP: 127/76 (11/28 0700) Pulse Rate: 112 (11/28 0752)  Labs: Recent Labs    04/28/17 1055 04/28/17 2000 04/29/17 0103 04/29/17 0604 04/29/17 0800  HGB 13.4  --   --  12.6*  --   HCT 42.4  --   --  39.7  --   PLT 177  --   --  159  --   HEPARINUNFRC  --  0.63  --  0.69  --   CREATININE 1.29*  --  1.12 1.13  --   TROPONINI  --  0.04* 0.04*  --  0.03*    Estimated Creatinine Clearance: 70.1 mL/min (by C-G formula based on SCr of 1.13 mg/dL).   Medications:  See medication history  Assessment: 76 yo man with h/o OHS to start on heparin for afib.  He was not on anticoagulation PTA. Heparin level this am is therapeutic. Goal of Therapy:  Heparin level 0.3-0.7 units/ml Monitor platelets by anticoagulation protocol: Yes   Plan:  Heparin drip at 1400 units/hr Daily HL and CBC while on heparin Monitor for bleeding complications F/u transition to oral agent  Zeriah Baysinger Poteet 04/29/2017,9:28 AM

## 2017-04-29 NOTE — Interval H&P Note (Signed)
History and Physical Interval Note:  Patient presents for TEE guided cardioversion due to persistent atrial fibrillation/flutter. Informed consent obtained and he is ready to proceed.  04/29/2017 2:32 PM

## 2017-04-29 NOTE — Progress Notes (Signed)
*  PRELIMINARY RESULTS* Echocardiogram 2D Echocardiogram has been performed.  Leavy Cella 04/29/2017, 4:04 PM

## 2017-04-29 NOTE — Progress Notes (Addendum)
PROGRESS NOTE    Caleb Wolf  STM:[id number]  DOB: 1940/10/14  DOA: 04/28/2017 PCP: Asencion Noble, MD   Brief Admission Hx: Caleb Wolf is a 76 y.o. male with a PMH of CAD s/p CABG in 8921, systolic CHF EF 19-41%, carotid artery disease s/p CEA, hypertension, GERD, chronic back pain and other past medical history detailed below who presented to the ED with the patient was seen today by GI for a scheduled routine follow-up appointment.  He was noted at that time to have an elevated heart rate up to 150-160.  He was admitted for Afib with RVR.   MDM/Assessment & Plan:    1. Atrial fibrillation with RVR- cardiology has started the patient on IV amiodarone.  He is being admitted to the stepdown unit for further monitoring.  They are planning a TEE cardioversion later today. Continue IV heparin. They are planning to transition him over to [place] after the procedure.  They will assess LV function during TEE today.        2. Chest discomfort - suspect this is related to Afib RVR and will continue to follow on telemetry.   3. Chronic systolic heart failure-TEE planned for later today with cardiology team.  He appears to be compensated at this time and not in acute exacerbation. 4. Acute kidney injury-resolved now after giving gentle IVF hydration.   5. Coronary artery disease- follow closely. Cardiology team.   He is on metoprolol, aspirin, statin, ACE inhibitor. 6. GERD-continue Protonix daily. 7. OSA-we will offer nightly CPAP.  DVT Prophylaxis: IV heparin Code Status: Full   Family Communication: bedside  Disposition Plan: home in next couple of days   Consultants:  cardiology  Subjective: Pt says that he had some chest discomfort this morning.  His heart rate it back up again.    Objective: Vitals:   04/29/17 0400 04/29/17 0500 04/29/17 0700 04/29/17 0752  BP:   127/76   Pulse:   (!) 114 (!) 112  Resp:   (!) 8 16  Temp: 97.6 F (36.4 C)   98.2 F (36.8 C)  TempSrc: Oral    Oral  SpO2:   95% 97%  Weight:  106.6 kg (235 lb 0.2 oz)    Height:        Intake/Output Summary (Last 24 hours) at 04/29/2017 0946 Last data filed at 04/29/2017 0500 Gross per 24 hour  Intake 576.1 ml  Output 3225 ml  Net -2648.9 ml   Filed Weights   04/28/17 1059 04/28/17 1848 04/29/17 0500  Weight: 108.9 kg (240 lb) 108.1 kg (238 lb 5.1 oz) 106.6 kg (235 lb 0.2 oz)   REVIEW OF SYSTEMS  As per history otherwise all reviewed and reported negative  Exam:  General exam: awake, alert, NAD.  Cooperative.   Respiratory system: Clear. No increased work of breathing. Cardiovascular system: S1 & S2 heard, irregularly irregular. No JVD, murmurs. Gastrointestinal system: Abdomen is nondistended, soft and nontender. Normal bowel sounds heard. Central nervous system: Alert and oriented. No focal neurological deficits. Extremities: no cyanosis or clubbing.  Data Reviewed: Basic Metabolic Panel: Recent Labs  Lab 04/28/17 1055 04/29/17 0103 04/29/17 0604  NA 138 140 139  K 4.3 3.8 3.9  CL 103 108 108  CO2 28 26 24   GLUCOSE 111* 121* 127*  BUN 23* 19 17  CREATININE 1.29* 1.12 1.13  CALCIUM 9.3 9.0 8.9  MG  --   --  1.8  PHOS  --   --  3.3  Liver Function Tests: Recent Labs  Lab 04/28/17 1055 04/29/17 0604  AST 18 15  ALT 20 16*  ALKPHOS 80 72  BILITOT 0.6 0.5  PROT 6.9 5.9*  ALBUMIN 4.3 3.5   No results for input(s): LIPASE, AMYLASE in the last 168 hours. No results for input(s): AMMONIA in the last 168 hours. CBC: Recent Labs  Lab 04/28/17 1055 04/29/17 0604  WBC 7.2 5.7  NEUTROABS 4.4  --   HGB 13.4 12.6*  HCT 42.4 39.7  MCV 97.2 97.1  PLT 177 159   Cardiac Enzymes: Recent Labs  Lab 04/28/17 2000 04/29/17 0103 04/29/17 0800  TROPONINI 0.04* 0.04* 0.03*   CBG (last 3)  No results for input(s): GLUCAP in the last 72 hours. Recent Results (from the past 240 hour(s))  MRSA PCR Screening     Status: None   Collection Time: 04/28/17  6:56 PM    Result Value Ref Range Status   MRSA by PCR NEGATIVE NEGATIVE Final    Comment:        The GeneXpert MRSA Assay (FDA approved for NASAL specimens only), is one component of a comprehensive MRSA colonization surveillance program. It is not intended to diagnose MRSA infection nor to guide or monitor treatment for MRSA infections.      Studies: Dg Chest Portable 1 View  Result Date: 04/28/2017 CLINICAL DATA:  Tachycardia. EXAM: PORTABLE CHEST 1 VIEW COMPARISON:  12/12/2015 FINDINGS: Stable postsurgical changes from CABG. The cardiac silhouette is enlarged. Tortuosity and calcific atherosclerotic disease of the aorta. There is no evidence of focal airspace consolidation, pleural effusion or pneumothorax. Osseous structures are without acute abnormality. Soft tissues are grossly normal. IMPRESSION: Calcific atherosclerotic disease of the aorta. Enlarged cardiac silhouette. No evidence of lobar consolidation or pulmonary edema. Electronically Signed   By: Fidela Salisbury [name]   On: 04/28/2017 11:27   Scheduled Meds: . aspirin  81 mg Oral Daily  . atorvastatin  40 mg Oral QPM  . docusate sodium  100 mg Oral BID  . doxazosin  4 mg Oral Daily  . finasteride  5 mg Oral QHS  . metoprolol tartrate  12.5 mg Oral QHS  . pantoprazole  40 mg Oral QHS  . sodium chloride flush  3 mL Intravenous Q12H   Continuous Infusions: . sodium chloride    . sodium chloride Stopped (04/28/17 2304)  . amiodarone 30 mg/hr (04/28/17 1903)  . heparin 1,400 Units/hr (04/29/17 0454)    Principal Problem:   Atrial fibrillation with rapid ventricular response (HCC) Active Problems:   Difficulty walking   OSA (obstructive sleep apnea)   Edema   HTN (hypertension)   Abnormal EKG   Carotid artery stenosis without cerebral infarction   S/P CABG x 3   Achalasia   GERD (gastroesophageal reflux disease)   Systolic CHF EF 31-54%   AKI (acute kidney injury) [facility])   Critical Care Time spent: 40  mins  Irwin Brakeman, MD, FAAFP [facility] Pager [phone]  If 7PM-7AM, please contact night-coverage [email or web address] Password [email or web address] 04/29/2017, 9:46 AM    LOS: 1 day

## 2017-04-29 NOTE — Progress Notes (Signed)
*  PRELIMINARY RESULTS* Echocardiogram Echocardiogram Transesophageal has been performed.  Leavy Cella 04/29/2017, 3:02 PM

## 2017-04-29 NOTE — Progress Notes (Signed)
Electrical Cardioversion Procedure Note Caleb Wolf [id number] 25-Apr-1941  Procedure: Electrical Cardioversion Indications:  Atrial Fibrillation  Procedure Details Consent: Risks of procedure as well as the alternatives and risks of each were explained to the (patient/caregiver).  Consent for procedure obtained. Time Out: Verified patient identification, verified procedure, site/side was marked, verified correct patient position, special equipment/implants available, medications/allergies/relevent history reviewed, required imaging and test results available.  Performed  Patient placed on cardiac monitor, pulse oximetry, supplemental oxygen as necessary.  Sedation given: Benzodiazepines Pacer pads placed anterior and posterior chest.  Cardioverted 1 time(s).  Cardioverted at 120J.  Evaluation Findings: Post procedure EKG shows: NSR Complications: None Patient did tolerate procedure well.   Caleb Wolf 04/29/2017, 3:07 PM

## 2017-04-29 NOTE — Transfer of Care (Signed)
Immediate Anesthesia Transfer of Care Note  Patient: Caleb Wolf  Procedure(s) Performed: CARDIOVERSION (N/A ) TRANSESOPHAGEAL ECHOCARDIOGRAM (TEE) WITH PROPOFOL (N/A )  Patient Location: PACU  Anesthesia Type:MAC  Level of Consciousness: awake, alert , oriented and patient cooperative  Airway & Oxygen Therapy: Patient Spontanous Breathing  Post-op Assessment: Report given to RN and Post -op Vital signs reviewed and stable  Post vital signs: Reviewed and stable  Last Vitals:  Vitals:   04/29/17 1230 04/29/17 1251  BP:  137/82  Pulse: (!) 126   Resp: (!) 21 13  Temp:    SpO2: 97% 96%    Last Pain:  Vitals:   04/29/17 1251  TempSrc: Oral  PainSc:          Complications: No apparent anesthesia complications

## 2017-04-29 NOTE — CV Procedure (Signed)
Transesophageal echocardiogram guided cardioversion  Indication: Persistent atrial fibrillation/flutter  Description of procedure: Informed consent obtained and patient taken to procedure suite where time-out was performed. Sedation was provided and monitored by the Anesthesia service with use of Versed and Propofol. Oropharynx anesthetized with viscous Lidocaine and bite block placed. Multiplane TEE probe easily inserted into the esophagus and multiple images were obtained - please refer to complete report for all details. No LAA or RAA appendage thrombus was noted. After completion of TEE, cardioversion was pursued. With anterior and posterior pads placed and connected to biphasic defibrillator, a single synchronized shock at 120 J was delivered with successful restoration of sinus rhythm, confirmed by ECG. Patient tolerated the procedure well without immediate complications.  Satira Sark, [name], F.A.C.C.

## 2017-04-29 NOTE — Progress Notes (Signed)
Progress Note  Patient Name: Caleb Wolf Date of Encounter: 04/29/2017  Primary Cardiologist: Dr. Kate Sable  Subjective   Experienced some chest tightness overnight with elevated heart rates.  No breathlessness or sense of palpitations this morning.   Inpatient Medications    Scheduled Meds: . aspirin  81 mg Oral Daily  . atorvastatin  40 mg Oral QPM  . docusate sodium  100 mg Oral BID  . doxazosin  4 mg Oral Daily  . finasteride  5 mg Oral QHS  . metoprolol tartrate  12.5 mg Oral QHS  . pantoprazole  40 mg Oral QHS  . sodium chloride flush  3 mL Intravenous Q12H   Continuous Infusions: . sodium chloride    . sodium chloride Stopped (04/28/17 2304)  . amiodarone 30 mg/hr (04/28/17 1903)  . heparin 1,400 Units/hr (04/29/17 0454)   PRN Meds: acetaminophen **OR** acetaminophen, colchicine, nitroGLYCERIN, ondansetron **OR** ondansetron (ZOFRAN) IV, polyethylene glycol, sodium chloride flush, traZODone   Vital Signs    Vitals:   04/29/17 0400 04/29/17 0500 04/29/17 0700 04/29/17 0752  BP:   127/76   Pulse:   (!) 114 (!) 112  Resp:   (!) 8 16  Temp: 97.6 F (36.4 C)   98.2 F (36.8 C)  TempSrc: Oral   Oral  SpO2:   95% 97%  Weight:  235 lb 0.2 oz (106.6 kg)    Height:        Intake/Output Summary (Last 24 hours) at 04/29/2017 0842 Last data filed at 04/29/2017 0500 Gross per 24 hour  Intake 576.1 ml  Output 3225 ml  Net -2648.9 ml   Filed Weights   04/28/17 1059 04/28/17 1848 04/29/17 0500  Weight: 240 lb (108.9 kg) 238 lb 5.1 oz (108.1 kg) 235 lb 0.2 oz (106.6 kg)    Telemetry    Atrial fibrillation/flutter.  Personally reviewed.  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 Personally reviewed.  Physical Exam   GEN:  Elderly male.  No acute distress.   Neck: No JVD. Cardiac:  Irregularly irregular, no gallop. Respiratory: Nonlabored. Clear to auscultation  bilaterally. GI: Soft, nontender, bowel sounds present. MS:  Mild leg edema; No deformity. Neuro:  Nonfocal. Psych: Alert and oriented x 3. Normal affect.  Labs    Chemistry Recent Labs  Lab 04/28/17 1055 04/29/17 0103 04/29/17 0604  NA 138 140 139  K 4.3 3.8 3.9  CL 103 108 108  CO2 28 26 24   GLUCOSE 111* 121* 127*  BUN 23* 19 17  CREATININE 1.29* 1.12 1.13  CALCIUM 9.3 9.0 8.9  PROT 6.9  --  5.9*  ALBUMIN 4.3  --  3.5  AST 18  --  15  ALT 20  --  16*  ALKPHOS 80  --  72  BILITOT 0.6  --  0.5  GFRNONAA 53* >60 >60  GFRAA >60 >60 >60  ANIONGAP 7 6 7      Hematology Recent Labs  Lab 04/28/17 1055 04/29/17 0604  WBC 7.2 5.7  RBC 4.36 4.09*  HGB 13.4 12.6*  HCT 42.4 39.7  MCV 97.2 97.1  MCH 30.7 30.8  MCHC 31.6 31.7  RDW 13.6 13.8  PLT 177 159    Cardiac Enzymes Recent Labs  Lab 04/28/17 2000 04/29/17 0103  TROPONINI 0.04* 0.04*    Recent Labs  Lab 04/28/17 1102  TROPIPOC 0.00     BNP Recent Labs  Lab  04/29/17 0604  BNP 594.0*     Radiology    Dg Chest Portable 1 View  Result Date: 04/28/2017 CLINICAL DATA:  Tachycardia. EXAM: PORTABLE CHEST 1 VIEW COMPARISON:  12/12/2015 FINDINGS: Stable postsurgical changes from CABG. The cardiac silhouette is enlarged. Tortuosity and calcific atherosclerotic disease of the aorta. There is no evidence of focal airspace consolidation, pleural effusion or pneumothorax. Osseous structures are without acute abnormality. Soft tissues are grossly normal. IMPRESSION: Calcific atherosclerotic disease of the aorta. Enlarged cardiac silhouette. No evidence of lobar consolidation or pulmonary edema. Electronically Signed   By: Fidela Salisbury [name]   On: 04/28/2017 11:27    Cardiac Studies   Echocardiogram pending.  Patient Profile     76 y.o. 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  He is now admitted with recurrent rapid atrial fibrillation.  Assessment & Plan    1.  Rapid atrial fibrillation/flutter.  Rhythm persists this morning.  CHADSVASC score is 4 and he is on IV heparin.  Also continues on IV amiodarone and metoprolol.  2.  CAD status post CABG in 2015.  No recent angina symptoms reported.    3.  History of cardiomyopathy, LVEF 45% by intraoperative TEE in 2015, although normal by transthoracic echocardiogram at that time.  4.  Mildly elevated troponin I level of 0.04, flat pattern not consistent with ACS.  Likely related to arrhythmia.  5.  OSA on CPAP.  6.  Essential hypertension, blood pressure stable.  Discussed with patient and wife.  Plan is for TEE guided cardioversion today. He presumably has recent onset atrial fibrillation/flutter, cannot completely exclude PAF however.  Continue heparin with conversion to Xarelto after cardioversion.  Not clear that we will need to continue amiodarone long-term at this point.  Plan to reassess LVEF by transthoracic echocardiogram once rhythm is stabilized.  Signed, Rozann Lesches, MD  04/29/2017, 8:42 AM

## 2017-04-29 NOTE — H&P (View-Only) (Signed)
Progress Note  Patient Name: Caleb Wolf Date of Encounter: 04/29/2017  Primary Cardiologist: Dr. Kate Sable  Subjective   Experienced some chest tightness overnight with elevated heart rates.  No breathlessness or sense of palpitations this morning.   Inpatient Medications    Scheduled Meds: . aspirin  81 mg Oral Daily  . atorvastatin  40 mg Oral QPM  . docusate sodium  100 mg Oral BID  . doxazosin  4 mg Oral Daily  . finasteride  5 mg Oral QHS  . metoprolol tartrate  12.5 mg Oral QHS  . pantoprazole  40 mg Oral QHS  . sodium chloride flush  3 mL Intravenous Q12H   Continuous Infusions: . sodium chloride    . sodium chloride Stopped (04/28/17 2304)  . amiodarone 30 mg/hr (04/28/17 1903)  . heparin 1,400 Units/hr (04/29/17 0454)   PRN Meds: acetaminophen **OR** acetaminophen, colchicine, nitroGLYCERIN, ondansetron **OR** ondansetron (ZOFRAN) IV, polyethylene glycol, sodium chloride flush, traZODone   Vital Signs    Vitals:   04/29/17 0400 04/29/17 0500 04/29/17 0700 04/29/17 0752  BP:   127/76   Pulse:   (!) 114 (!) 112  Resp:   (!) 8 16  Temp: 97.6 F (36.4 C)   98.2 F (36.8 C)  TempSrc: Oral   Oral  SpO2:   95% 97%  Weight:  235 lb 0.2 oz (106.6 kg)    Height:        Intake/Output Summary (Last 24 hours) at 04/29/2017 0842 Last data filed at 04/29/2017 0500 Gross per 24 hour  Intake 576.1 ml  Output 3225 ml  Net -2648.9 ml   Filed Weights   04/28/17 1059 04/28/17 1848 04/29/17 0500  Weight: 240 lb (108.9 kg) 238 lb 5.1 oz (108.1 kg) 235 lb 0.2 oz (106.6 kg)    Telemetry    Atrial fibrillation/flutter.  Personally reviewed.  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 Personally reviewed.  Physical Exam   GEN:  Elderly male.  No acute distress.   Neck: No JVD. Cardiac:  Irregularly irregular, no gallop. Respiratory: Nonlabored. Clear to auscultation  bilaterally. GI: Soft, nontender, bowel sounds present. MS:  Mild leg edema; No deformity. Neuro:  Nonfocal. Psych: Alert and oriented x 3. Normal affect.  Labs    Chemistry Recent Labs  Lab 04/28/17 1055 04/29/17 0103 04/29/17 0604  NA 138 140 139  K 4.3 3.8 3.9  CL 103 108 108  CO2 28 26 24   GLUCOSE 111* 121* 127*  BUN 23* 19 17  CREATININE 1.29* 1.12 1.13  CALCIUM 9.3 9.0 8.9  PROT 6.9  --  5.9*  ALBUMIN 4.3  --  3.5  AST 18  --  15  ALT 20  --  16*  ALKPHOS 80  --  72  BILITOT 0.6  --  0.5  GFRNONAA 53* >60 >60  GFRAA >60 >60 >60  ANIONGAP 7 6 7      Hematology Recent Labs  Lab 04/28/17 1055 04/29/17 0604  WBC 7.2 5.7  RBC 4.36 4.09*  HGB 13.4 12.6*  HCT 42.4 39.7  MCV 97.2 97.1  MCH 30.7 30.8  MCHC 31.6 31.7  RDW 13.6 13.8  PLT 177 159    Cardiac Enzymes Recent Labs  Lab 04/28/17 2000 04/29/17 0103  TROPONINI 0.04* 0.04*    Recent Labs  Lab 04/28/17 1102  TROPIPOC 0.00     BNP Recent Labs  Lab  04/29/17 0604  BNP 594.0*     Radiology    Dg Chest Portable 1 View  Result Date: 04/28/2017 CLINICAL DATA:  Tachycardia. EXAM: PORTABLE CHEST 1 VIEW COMPARISON:  12/12/2015 FINDINGS: Stable postsurgical changes from CABG. The cardiac silhouette is enlarged. Tortuosity and calcific atherosclerotic disease of the aorta. There is no evidence of focal airspace consolidation, pleural effusion or pneumothorax. Osseous structures are without acute abnormality. Soft tissues are grossly normal. IMPRESSION: Calcific atherosclerotic disease of the aorta. Enlarged cardiac silhouette. No evidence of lobar consolidation or pulmonary edema. Electronically Signed   By: Fidela Salisbury [name]   On: 04/28/2017 11:27    Cardiac Studies   Echocardiogram pending.  Patient Profile     76 y.o. 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  He is now admitted with recurrent rapid atrial fibrillation.  Assessment & Plan    1.  Rapid atrial fibrillation/flutter.  Rhythm persists this morning.  CHADSVASC score is 4 and he is on IV heparin.  Also continues on IV amiodarone and metoprolol.  2.  CAD status post CABG in 2015.  No recent angina symptoms reported.    3.  History of cardiomyopathy, LVEF 45% by intraoperative TEE in 2015, although normal by transthoracic echocardiogram at that time.  4.  Mildly elevated troponin I level of 0.04, flat pattern not consistent with ACS.  Likely related to arrhythmia.  5.  OSA on CPAP.  6.  Essential hypertension, blood pressure stable.  Discussed with patient and wife.  Plan is for TEE guided cardioversion today. He presumably has recent onset atrial fibrillation/flutter, cannot completely exclude PAF however.  Continue heparin with conversion to Xarelto after cardioversion.  Not clear that we will need to continue amiodarone long-term at this point.  Plan to reassess LVEF by transthoracic echocardiogram once rhythm is stabilized.  Signed, Rozann Lesches, MD  04/29/2017, 8:42 AM

## 2017-04-30 ENCOUNTER — Inpatient Hospital Stay (HOSPITAL_COMMUNITY): Payer: Medicare Other

## 2017-04-30 ENCOUNTER — Encounter (HOSPITAL_COMMUNITY): Payer: Self-pay | Admitting: Cardiology

## 2017-04-30 LAB — COMPREHENSIVE METABOLIC PANEL
ALBUMIN: 3.8 g/dL (ref 3.5–5.0)
ALK PHOS: 74 U/L (ref 38–126)
ALT: 16 U/L — ABNORMAL LOW (ref 17–63)
ANION GAP: 8 (ref 5–15)
AST: 16 U/L (ref 15–41)
BILIRUBIN TOTAL: 0.5 mg/dL (ref 0.3–1.2)
BUN: 17 mg/dL (ref 6–20)
CO2: 26 mmol/L (ref 22–32)
Calcium: 9.1 mg/dL (ref 8.9–10.3)
Chloride: 107 mmol/L (ref 101–111)
Creatinine, Ser: 1.27 mg/dL — ABNORMAL HIGH (ref 0.61–1.24)
GFR calc Af Amer: >60 mL/min (ref >60)
GFR calc non Af Amer: 54 mL/min — ABNORMAL LOW (ref >60)
GLUCOSE: 100 mg/dL — AB (ref 65–99)
POTASSIUM: 4 mmol/L (ref 3.5–5.1)
SODIUM: 141 mmol/L (ref 135–145)
TOTAL PROTEIN: 6.2 g/dL — AB (ref 6.5–8.1)

## 2017-04-30 LAB — MAGNESIUM: Magnesium: 1.9 mg/dL (ref 1.7–2.4)

## 2017-04-30 MED ORDER — AMIODARONE HCL 200 MG PO TABS: 200.0000 mg | ORAL_TABLET | Freq: Two times a day (BID) | ORAL | 0 refills | Status: DC

## 2017-04-30 MED ORDER — RIVAROXABAN 20 MG PO TABS: 20.0000 mg | ORAL_TABLET | Freq: Every day | ORAL | 2 refills | Status: DC

## 2017-04-30 MED ORDER — METOPROLOL TARTRATE 25 MG PO TABS: 25.0000 mg | ORAL_TABLET | Freq: Two times a day (BID) | ORAL | 2 refills | Status: DC

## 2017-04-30 MED ORDER — METOPROLOL TARTRATE 5 MG/5ML IV SOLN
5.0000 mg | Freq: Once | INTRAVENOUS | Status: AC
  Administered 2017-04-30: 5 mg via INTRAVENOUS
  Filled 2017-04-30: qty 5

## 2017-04-30 MED ORDER — METOPROLOL TARTRATE 25 MG PO TABS
25.0000 mg | ORAL_TABLET | Freq: Two times a day (BID) | ORAL | Status: DC
  Administered 2017-04-30: 25 mg via ORAL
  Filled 2017-04-30: qty 1

## 2017-04-30 MED ORDER — AMIODARONE HCL 200 MG PO TABS
200.0000 mg | ORAL_TABLET | Freq: Two times a day (BID) | ORAL | Status: DC
  Administered 2017-04-30: 200 mg via ORAL
  Filled 2017-04-30: qty 1

## 2017-04-30 NOTE — Addendum Note (Signed)
Addendum  created 04/30/17 1100 by Charmaine Downs, CRNA   Sign clinical note

## 2017-04-30 NOTE — Care Management Important Message (Signed)
Important Message  Patient Details  Name: Caleb Wolf MRN: [id number] Date of Birth: 07-18-1940   Medicare Important Message Given:  Yes    Kimorah Ridolfi, Chauncey Reading, RN 04/30/2017, 2:19 PM

## 2017-04-30 NOTE — Anesthesia Postprocedure Evaluation (Signed)
Anesthesia Post Note  Patient: Caleb Wolf  Procedure(s) Performed: CARDIOVERSION (N/A ) TRANSESOPHAGEAL ECHOCARDIOGRAM (TEE) WITH PROPOFOL (N/A )  Patient location during evaluation: ICU Anesthesia Type: MAC Level of consciousness: awake and alert Pain management: pain level controlled Vital Signs Assessment: post-procedure vital signs reviewed and stable Respiratory status: spontaneous breathing, nonlabored ventilation and respiratory function stable Cardiovascular status: blood pressure returned to baseline Postop Assessment: no apparent nausea or vomiting Anesthetic complications: no     Last Vitals:  Vitals:   04/30/17 0900 04/30/17 1000  BP: 108/64 125/84  Pulse: 99 96  Resp: 18 12  Temp:    SpO2: 97% 97%    Last Pain:  Vitals:   04/30/17 0757  TempSrc: Oral  PainSc:                  Belvia Gotschall J

## 2017-04-30 NOTE — Progress Notes (Signed)
Patient is alert and oriented. Vital signs are stable. Saline lock removed. No complaints of any distress. Discharge instructions given. Prescriptions given. Patient verbalized understanding of instructions. Patient left floor via wheelchair with family and nursing staff.

## 2017-04-30 NOTE — Progress Notes (Signed)
Progress Note  Patient Name: Caleb Wolf Date of Encounter: 04/30/2017  Primary Cardiologist: Kate Sable, MD  Subjective   No complaints of chest pain, dizziness, or dyspnea.  He denies palpitations.  Inpatient Medications    Scheduled Meds: . atorvastatin  40 mg Oral QPM  . docusate sodium  100 mg Oral BID  . doxazosin  4 mg Oral Daily  . finasteride  5 mg Oral QHS  . metoprolol tartrate  25 mg Oral BID  . pantoprazole  40 mg Oral QHS  . rivaroxaban  20 mg Oral Daily   Continuous Infusions: . sodium chloride     PRN Meds: acetaminophen **OR** acetaminophen, colchicine, nitroGLYCERIN, ondansetron **OR** ondansetron (ZOFRAN) IV, polyethylene glycol, traZODone   Vital Signs    Vitals:   04/30/17 0500 04/30/17 0700 04/30/17 0757 04/30/17 0800  BP: 100/69 131/65  91/77  Pulse: 93 92 (!) 142 (!) 143  Resp: 13 (!) 6 20 16   Temp:   98.2 F (36.8 C)   TempSrc:   Oral   SpO2: 94% 96% 96% 96%  Weight: 240 lb 8.4 oz (109.1 kg)     Height:        Intake/Output Summary (Last 24 hours) at 04/30/2017 0841 Last data filed at 04/29/2017 2100 Gross per 24 hour  Intake 940 ml  Output 275 ml  Net 665 ml   Filed Weights   04/28/17 1848 04/29/17 0500 04/30/17 0500  Weight: 238 lb 5.1 oz (108.1 kg) 235 lb 0.2 oz (106.6 kg) 240 lb 8.4 oz (109.1 kg)    Telemetry    Atrial fibrillation/flutter, heart rates between 100-125 bpm.-rhythm returned between 1:00 AM and 1:30 AM overnight.  Personally reviewed.  Physical Exam   GEN:  Elderly male.  No acute distress. Neck: No JVD. Cardiac: IRRR, tachycardic no gallops.  Respiratory: Clear to auscultation bilaterally. GI: Soft, nontender, non-distended  MS: No edema; No deformity. Neuro:  Nonfocal  Psych: Normal affect   Labs    Chemistry Recent Labs  Lab 04/28/17 1055 04/29/17 0103 04/29/17 0604 04/30/17 0425  NA 138 140 139 141  K 4.3 3.8 3.9 4.0  CL 103 108 108 107  CO2 28 26 24 26   GLUCOSE 111* 121* 127*  100*  BUN 23* 19 17 17   CREATININE 1.29* 1.12 1.13 1.27*  CALCIUM 9.3 9.0 8.9 9.1  PROT 6.9  --  5.9* 6.2*  ALBUMIN 4.3  --  3.5 3.8  AST 18  --  15 16  ALT 20  --  16* 16*  ALKPHOS 80  --  72 74  BILITOT 0.6  --  0.5 0.5  GFRNONAA 53* >60 >60 54*  GFRAA >60 >60 >60 >60  ANIONGAP 7 6 7 8      Hematology Recent Labs  Lab 04/28/17 1055 04/29/17 0604  WBC 7.2 5.7  RBC 4.36 4.09*  HGB 13.4 12.6*  HCT 42.4 39.7  MCV 97.2 97.1  MCH 30.7 30.8  MCHC 31.6 31.7  RDW 13.6 13.8  PLT 177 159    Cardiac Enzymes Recent Labs  Lab 04/28/17 2000 04/29/17 0103 04/29/17 0800  TROPONINI 0.04* 0.04* 0.03*    Recent Labs  Lab 04/28/17 1102  TROPIPOC 0.00     BNP Recent Labs  Lab 04/29/17 0604  BNP 594.0*     Radiology    Dg Chest Portable 1 View  Result Date: 04/28/2017 CLINICAL DATA:  Tachycardia. EXAM: PORTABLE CHEST 1 VIEW COMPARISON:  12/12/2015 FINDINGS: Stable postsurgical changes from  CABG. The cardiac silhouette is enlarged. Tortuosity and calcific atherosclerotic disease of the aorta. There is no evidence of focal airspace consolidation, pleural effusion or pneumothorax. Osseous structures are without acute abnormality. Soft tissues are grossly normal. IMPRESSION: Calcific atherosclerotic disease of the aorta. Enlarged cardiac silhouette. No evidence of lobar consolidation or pulmonary edema. Electronically Signed   By: Fidela Salisbury [name]   On: 04/28/2017 11:27    Cardiac Studies   TEE 04/29/2017: Study Conclusions  - Left ventricle: Systolic function was mildly to moderately   reduced. The estimated ejection fraction was in the range of 40%   to 45%. Mid anteroseptal hypokinesis noted. - Descending aorta: The descending aorta had moderate diffuse   disease. - Mitral valve: There was mild regurgitation. - Left atrium: No evidence of thrombus in the atrial cavity or   appendage. Emptying velocity was mildly reduced. - Right atrium: No evidence of  thrombus in the atrial cavity or   appendage. - Atrial septum: No defect or patent foramen ovale was identified. - Tricuspid valve: There was mild-moderate regurgitation.  Echocardiogram 04/29/2017: Study Conclusions  - Left ventricle: The cavity size was normal. Wall thickness was at   the upper limits of normal. Systolic function was mildly reduced.   The estimated ejection fraction was in the range of 45% to 50%.   There is hypokinesis of the midanteroseptal myocardium. Features   are consistent with a pseudonormal left ventricular filling   pattern, with concomitant abnormal relaxation and increased   filling pressure (grade 2 diastolic dysfunction). - Aortic valve: Mildly calcified annulus. Trileaflet; mildly   calcified leaflets. - Mitral valve: Mildly calcified annulus. There was mild   regurgitation. - Left atrium: The atrium was mildly dilated. - Right atrium: The atrium was mildly dilated. Central venous   pressure (est): 3 mm Hg. - Atrial septum: No defect or patent foramen ovale was identified. - Tricuspid valve: There was trivial regurgitation. - Pulmonary arteries: PA peak pressure: 41 mm Hg (S). - Pericardium, extracardiac: There was no pericardial effusion.  Impressions:  - Upper normal LV wall thickness with LVEF 45-50%. There is mid   anteroseptal hypokinesis. Grade 2 diastolic dysfunction. Mild   biatrial enlargement. Mildly calcified mitral annulus with mild   mitral regurgitation. Mildly sclerotic aortic valve. Trivial   tricuspid regurgitation with PA/P estimated at 41 mmHg.  Patient Profile     76 y.o. 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  He is now admitted with recurrent rapid atrial fibrillation/flutter.  Assessment & Plan    1. Atrial fibrillation/flutter: Patient underwent successful  TEE cardioversion on 04/29/2017.  Unfortunately, he reverted to atrial fibrillation/flutter between 1:00 AM to 1:30 AM overnight.  Amiodarone was discontinued yesterday.  He has been converted from intravenous heparin to Xarelto. Will discuss with Dr. Domenic Polite need to reinstitute amiodarone versus EP evaluation.  Will increase metoprolol to 25 mg twice daily.  2.  Coronary artery disease: Status post coronary artery bypass grafting 05/22/2014 with LIMA to LAD, SVG to obtuse marginal 1 and 2.  He remains on statin therapy, ACE inhibitor.  He is no longer on aspirin in the setting of DOAC.   3.  Cardiomyopathy: Most recent echocardiogram revealing LVEF of 40-45%.  He is currently on p.o. Lasix twice a week, beta-blocker, and ACE inhibitor.  4.  History of hypertension: Multiple medications to include metoprolol  Cardura and ACE inhibitor.   Signed, Phill Myron. West Pugh, ANP, AACC   04/30/2017, 8:41 AM     Attending note:  Patient seen and examined.  I reviewed interval hospital course and discussed the case with Ms. West Pugh.  I also modified the above note.  Mr. Spalla underwent successful TEE guided cardioversion yesterday afternoon but unfortunately reverted to atrial fibrillation/flutter sometime between 1:00 AM and 1:30 AM overnight.  Amiodarone drip was discontinued yesterday.  He was also converted from heparin drip to Xarelto.  He does not report feeling any specific palpitations with this rhythm change and I wonder whether he has been having paroxysmal atrial arrhythmias that were only just recently documented.  On examination this morning he is comfortable, heart rate between 100-120 in atrial fibrillation/flutter on telemetry which I personally reviewed.  Systolic blood pressure between 100 and 130.  Lungs are clear without wheezing.  Cardiac exam reveals irregularly irregular rhythm.  Lab work reveals potassium 4.0, BUN 17, creatinine 1.27.  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  Situation discussed with patient and wife.  With rapid return to atrial arrhythmia, anticipate further need for antiarrhythmic therapy and potentially a follow-up cardioversion later if he does not convert in the interim.  We will try to get his heart rate controlled this morning and still anticipate discharge with close office follow-up.  Plan to give IV Lopressor and then increase his Lopressor to 25 mg twice daily.  Also place him on amiodarone 200 mg twice daily for outpatient load.  He will continue Xarelto 20 mg daily, stop aspirin as an outpatient.  We will arrange an office follow-up visit for next week.  Satira Sark, [name], F.A.C.C.

## 2017-04-30 NOTE — Progress Notes (Signed)
**Note De-Identified Caleb Wolf Obfuscation** EKG complete and placed in patient chart.  RN notified of abnormal results

## 2017-04-30 NOTE — Discharge Summary (Signed)
Physician Discharge Summary  Caleb Wolf TTS:[id number] DOB: December 08, 1940 DOA: 04/28/2017  PCP: Asencion Noble, MD  Admit date: 04/28/2017 Discharge date: 04/30/2017  Time spent: 45 minutes  Recommendations for Outpatient Follow-up:  -To be discharged home today. -Advised to follow-up with PCP in 2 weeks. -Has follow-up with cardiology scheduled for Monday, December 3.  Discharge Diagnoses:  Principal Problem:   Atrial fibrillation with rapid ventricular response (HCC) Active Problems:   Difficulty walking   OSA (obstructive sleep apnea)   Edema   HTN (hypertension)   Abnormal EKG   Carotid artery stenosis without cerebral infarction   S/P CABG x 3   Achalasia   GERD (gastroesophageal reflux disease)   Systolic CHF EF 09-23%   AKI (acute kidney injury) [facility])   Discharge Condition: Stable and improved  Filed Weights   04/28/17 1848 04/29/17 0500 04/30/17 0500  Weight: 108.1 kg (238 lb 5.1 oz) 106.6 kg (235 lb 0.2 oz) 109.1 kg (240 lb 8.4 oz)    History of present illness:  As per Dr. Wynetta Emery on 11/27: Caleb Wolf is a 76 y.o. male with a PMH of CAD s/p CABG in 3007, systolic CHF EF 62-26%, carotid artery disease s/p CEA, hypertension, GERD, chronic back pain and other past medical history detailed below who presented to the ED with the patient was seen today by GI for a scheduled routine follow-up appointment.  He was noted at that time to have an elevated heart rate up to 150-160.  His heart rate was noted to be irregular.  He reported that he woke up at around 6 AM with a feeling of heart palpitations.  He was immediately transferred to the emergency department where he was found to be in rapid atrial fibrillation with RVR.  He was seen by cardiology.  He has been started on an IV diltiazem drip.  He has also been started on IV amiodarone.  Hospitalist was asked to admit the patient for further inpatient treatment.    Hospital Course:   1. Atrial fibrillation with RVR-  failed TEE guided cardioversion on 11/28.  Has been started on Xarelto, cardiology has increased his Lopressor to 25 mg twice daily and placed him on amiodarone twice daily. They will arrange follow-up in the office next week.rates in the 80s to low 100s on discharge 2. Chronic systolic heart failure-repeat echocardiogram ordered.  He appears to be compensated at this time and not in acute exacerbation. 3. Chronic kidney disease stage II- I it appears his baseline creatinine is between 1.1 and 1.2.  At baseline.   4. Coronary artery disease- stable, no chest pain symptoms, no shortness of breath symptoms.  He is on metoprolol, aspirin, statin, ACE inhibitor. 5. GERD-continue Protonix daily. 6. OSA-we will offer nightly CPAP.     Procedures:  None   Consultations:  Cardiology  Discharge Instructions  Discharge Instructions    Diet - low sodium heart healthy   Complete by:  As directed    Increase activity slowly   Complete by:  As directed      Allergies as of 04/30/2017      Reactions   Codeine Sulfate Nausea Only      Medication List    STOP taking these medications   hydrochlorothiazide 25 MG tablet Commonly known as:  HYDRODIURIL     TAKE these medications   amiodarone 200 MG tablet Commonly known as:  PACERONE Take 1 tablet (200 mg total) by mouth 2 (two) times daily.  aspirin 81 MG tablet Take 1 tablet (81 mg total) by mouth daily.   atorvastatin 40 MG tablet Commonly known as:  LIPITOR Take 1 tablet (40 mg total) by mouth Wolf evening.   colchicine 0.6 MG tablet Take 0.6 mg by mouth as needed (gout).   cyclobenzaprine 10 MG tablet Commonly known as:  FLEXERIL TAKE 1 TABLET BY MOUTH EVERYDAY AT BEDTIME (STOP METHOCARBAMOL)   doxazosin 2 MG tablet Commonly known as:  CARDURA Take 1 tablet (2 mg total) by mouth daily. What changed:  how much to take   finasteride 5 MG tablet Commonly known as:  PROSCAR Take 5 mg by mouth at bedtime.   furosemide  40 MG tablet Commonly known as:  LASIX Take 1 tablet (40 mg total) by mouth as directed. 2 times per wk.   lisinopril 5 MG tablet Commonly known as:  PRINIVIL,ZESTRIL Take 1 tablet (5 mg total) by mouth daily. What changed:  how much to take   metoprolol tartrate 25 MG tablet Commonly known as:  LOPRESSOR Take 1 tablet (25 mg total) by mouth 2 (two) times daily. What changed:    how much to take  when to take this   nitroGLYCERIN 0.4 MG SL tablet Commonly known as:  NITROSTAT Place 1 tablet (0.4 mg total) under the tongue Wolf 5 (five) minutes as needed for chest pain.   pantoprazole 40 MG tablet Commonly known as:  PROTONIX Take 1 tablet (40 mg total) by mouth daily before breakfast. What changed:  when to take this   potassium chloride SA 20 MEQ tablet Commonly known as:  K-DUR,KLOR-CON Take 1 tablet (20 mEq total) by mouth as directed. 2 times per wk.   rivaroxaban 20 MG Tabs tablet Commonly known as:  XARELTO Take 1 tablet (20 mg total) by mouth daily. Start taking on:  05/01/2017      Allergies  Allergen Reactions  . Codeine Sulfate Nausea Only   Follow-up Information    Herminio Commons, MD Follow up on 05/04/2017.   Specialty:  Cardiology Why:  at 1:20 pm Contact information: [address] [address] [phone]            The results of significant diagnostics from this hospitalization (including imaging, microbiology, ancillary and laboratory) are listed below for reference.    Significant Diagnostic Studies: Dg Chest Portable 1 View  Result Date: 04/28/2017 CLINICAL DATA:  Tachycardia. EXAM: PORTABLE CHEST 1 VIEW COMPARISON:  12/12/2015 FINDINGS: Stable postsurgical changes from CABG. The cardiac silhouette is enlarged. Tortuosity and calcific atherosclerotic disease of the aorta. There is no evidence of focal airspace consolidation, pleural effusion or pneumothorax. Osseous structures are without acute abnormality. Soft tissues  are grossly normal. IMPRESSION: Calcific atherosclerotic disease of the aorta. Enlarged cardiac silhouette. No evidence of lobar consolidation or pulmonary edema. Electronically Signed   By: Fidela Salisbury [name]   On: 04/28/2017 11:27    Microbiology: Recent Results (from the past 240 hour(s))  MRSA PCR Screening     Status: None   Collection Time: 04/28/17  6:56 PM  Result Value Ref Range Status   MRSA by PCR NEGATIVE NEGATIVE Final    Comment:        The GeneXpert MRSA Assay (FDA approved for NASAL specimens only), is one component of a comprehensive MRSA colonization surveillance program. It is not intended to diagnose MRSA infection nor to guide or monitor treatment for MRSA infections.      Labs: Basic Metabolic Panel: Recent  Labs  Lab 04/28/17 1055 04/29/17 0103 04/29/17 0604 04/30/17 0425  NA 138 140 139 141  K 4.3 3.8 3.9 4.0  CL 103 108 108 107  CO2 28 26 24 26   GLUCOSE 111* 121* 127* 100*  BUN 23* 19 17 17   CREATININE 1.29* 1.12 1.13 1.27*  CALCIUM 9.3 9.0 8.9 9.1  MG  --   --  1.8 1.9  PHOS  --   --  3.3  --    Liver Function Tests: Recent Labs  Lab 04/28/17 1055 04/29/17 0604 04/30/17 0425  AST 18 15 16   ALT 20 16* 16*  ALKPHOS 80 72 74  BILITOT 0.6 0.5 0.5  PROT 6.9 5.9* 6.2*  ALBUMIN 4.3 3.5 3.8   No results for input(s): LIPASE, AMYLASE in the last 168 hours. No results for input(s): AMMONIA in the last 168 hours. CBC: Recent Labs  Lab 04/28/17 1055 04/29/17 0604  WBC 7.2 5.7  NEUTROABS 4.4  --   HGB 13.4 12.6*  HCT 42.4 39.7  MCV 97.2 97.1  PLT 177 159   Cardiac Enzymes: Recent Labs  Lab 04/28/17 2000 04/29/17 0103 04/29/17 0800  TROPONINI 0.04* 0.04* 0.03*   BNP: BNP (last 3 results) Recent Labs    04/29/17 0604  BNP 594.0*    ProBNP (last 3 results) No results for input(s): PROBNP in the last 8760 hours.  CBG: No results for input(s): GLUCAP in the last 168 hours.     Signed:  Lelon Frohlich  [facility] Pager: [phone] 04/30/2017, 5:17 PM

## 2017-05-01 ENCOUNTER — Other Ambulatory Visit: Payer: Self-pay | Admitting: Cardiovascular Disease

## 2017-05-01 NOTE — Telephone Encounter (Signed)
Patient's discharge summary has pt taking lisinopril 5 mg, wife states Dr Willey Blade had patient tking 40 mg.They have apt in 3 days and we will have Dr.Koneswaran review. Mrs Knaak is going to call Dr Ria Comment office

## 2017-05-01 NOTE — Telephone Encounter (Signed)
Patient's wife has questions regarding medication instructions on d/c summary from [facility]. / tg

## 2017-05-04 ENCOUNTER — Ambulatory Visit (INDEPENDENT_AMBULATORY_CARE_PROVIDER_SITE_OTHER): Payer: Medicare Other | Admitting: Cardiovascular Disease

## 2017-05-04 ENCOUNTER — Encounter: Payer: Self-pay | Admitting: Cardiovascular Disease

## 2017-05-04 VITALS — BP 116/62 | HR 96 | Ht 70.0 in | Wt 247.0 lb

## 2017-05-04 DIAGNOSIS — Z951 Presence of aortocoronary bypass graft: Secondary | ICD-10-CM

## 2017-05-04 DIAGNOSIS — I6522 Occlusion and stenosis of left carotid artery: Secondary | ICD-10-CM

## 2017-05-04 DIAGNOSIS — I209 Angina pectoris, unspecified: Secondary | ICD-10-CM | POA: Diagnosis not present

## 2017-05-04 DIAGNOSIS — I25708 Atherosclerosis of coronary artery bypass graft(s), unspecified, with other forms of angina pectoris: Secondary | ICD-10-CM

## 2017-05-04 DIAGNOSIS — I4891 Unspecified atrial fibrillation: Secondary | ICD-10-CM

## 2017-05-04 DIAGNOSIS — I6521 Occlusion and stenosis of right carotid artery: Secondary | ICD-10-CM | POA: Diagnosis not present

## 2017-05-04 DIAGNOSIS — R600 Localized edema: Secondary | ICD-10-CM

## 2017-05-04 DIAGNOSIS — Z9289 Personal history of other medical treatment: Secondary | ICD-10-CM | POA: Diagnosis not present

## 2017-05-04 DIAGNOSIS — Z9889 Other specified postprocedural states: Secondary | ICD-10-CM

## 2017-05-04 DIAGNOSIS — I10 Essential (primary) hypertension: Secondary | ICD-10-CM | POA: Diagnosis not present

## 2017-05-04 DIAGNOSIS — E7800 Pure hypercholesterolemia, unspecified: Secondary | ICD-10-CM

## 2017-05-04 MED ORDER — AMIODARONE HCL 200 MG PO TABS: ORAL_TABLET | ORAL | 3 refills | Status: DC

## 2017-05-04 NOTE — Patient Instructions (Signed)
Medication Instructions:  STOP ASPIRIN   STARTING Thursday 12/6 DECREASE AMIODARONE TO 200 MG ONCE DAILY   Labwork: NONE  Testing/Procedures: NONE  Follow-Up: Your physician recommends that you schedule a follow-up appointment in: 6 WEEKS    Any Other Special Instructions Will Be Listed Below (If Applicable).     If you need a refill on your cardiac medications before your next appointment, please call your pharmacy.

## 2017-05-04 NOTE — Progress Notes (Signed)
SUBJECTIVE: The patient presents for follow-up after recent hospitalization for rapid atrial fibrillation and flutter.  He was discharged on 04/30/17.  He initially successfully underwent TEE guided cardioversion but then reverted back to rapid atrial fibrillation/flutter the following day.  His beta-blocker was increased to 25 mg twice daily and he was started on amiodarone 200 mg twice daily.  He is anticoagulated with Xarelto.  He also has coronary artery disease with a history of CABG, bilateral carotid artery disease, and chronic systolic heart failure.  Echocardiogram 04/29/17: Mildly reduced left ventricular systolic function, LVEF 88-28%, mild anteroseptal hypokinesis, grade 2 diastolic dysfunction, mild biatrial enlargement, and mild mitral regurgitation.  TEE on 04/29/17 showed LVEF 40-45% with mild to moderate tricuspid regurgitation and mild mitral regurgitation.  He is feeling well and denies chest pain, palpitations, shortness of breath.  He said he does not adhere to his diet like he should.  He does some outdoor physical activity but does not walk as much as he should.  He and his wife had several questions regarding his hospitalization and current management strategy.    Review of Systems: As per "subjective", otherwise negative.  Allergies  Allergen Reactions  . Codeine Sulfate Nausea Only    Current Outpatient Medications  Medication Sig Dispense Refill  . amiodarone (PACERONE) 200 MG tablet Take 1 tablet (200 mg total) by mouth 2 (two) times daily. 60 tablet 0  . aspirin 81 MG tablet Take 1 tablet (81 mg total) by mouth daily. 30 tablet   . atorvastatin (LIPITOR) 40 MG tablet Take 1 tablet (40 mg total) by mouth every evening. 90 tablet 3  . colchicine 0.6 MG tablet Take 0.6 mg by mouth as needed (gout).     . cyclobenzaprine (FLEXERIL) 10 MG tablet TAKE 1 TABLET BY MOUTH EVERYDAY AT BEDTIME (STOP METHOCARBAMOL)  1  . doxazosin (CARDURA) 2 MG tablet Take 1  tablet (2 mg total) by mouth daily. (Patient taking differently: Take 4 mg by mouth daily. ) 90 tablet 3  . finasteride (PROSCAR) 5 MG tablet Take 5 mg by mouth at bedtime.     . furosemide (LASIX) 40 MG tablet Take 1 tablet (40 mg total) by mouth as directed. 2 times per wk. 90 tablet 3  . lisinopril (PRINIVIL,ZESTRIL) 40 MG tablet Take 40 mg by mouth daily.    . metoprolol tartrate (LOPRESSOR) 25 MG tablet Take 1 tablet (25 mg total) by mouth 2 (two) times daily. 60 tablet 2  . nitroGLYCERIN (NITROSTAT) 0.4 MG SL tablet Place 1 tablet (0.4 mg total) under the tongue every 5 (five) minutes as needed for chest pain. 25 tablet 3  . pantoprazole (PROTONIX) 40 MG tablet Take 1 tablet (40 mg total) by mouth daily before breakfast. (Patient taking differently: Take 40 mg by mouth at bedtime. ) 90 tablet 3  . potassium chloride SA (K-DUR,KLOR-CON) 20 MEQ tablet Take 1 tablet (20 mEq total) by mouth as directed. 2 times per wk. 90 tablet 3  . rivaroxaban (XARELTO) 20 MG TABS tablet Take 1 tablet (20 mg total) by mouth daily. 30 tablet 2   No current facility-administered medications for this visit.     Past Medical History:  Diagnosis Date  . Arthritis   . Basal cell carcinoma   . Carotid stenosis   . Chronic back pain   . Coronary artery disease    Multvessel s/p CABG 2015  . Enlarged prostate   . Essential hypertension   . GERD (  gastroesophageal reflux disease)   . Gout   . History of colon polyps   . History of kidney stones   . Hyperlipidemia   . Lumbar disc disease   . Myocardial infarction ([place])    2015  . Postoperative atrial fibrillation ([place])   . Sleep apnea    CPAP  . Urinary urgency   . Wears glasses     Past Surgical History:  Procedure Laterality Date  . BACK SURGERY    . CARDIAC CATHETERIZATION  05/22/2014   Procedure: IABP INSERTION;  Surgeon: Leonie Man, MD;  Location: [facility] CATH LAB;  Service: Cardiovascular;;  . CARDIOVERSION N/A 04/29/2017   Procedure:  CARDIOVERSION;  Surgeon: Satira Sark, MD;  [facility];  Service: Cardiovascular;  Laterality: N/A;  . Cataract surgery Right   . COLONOSCOPY    . CORONARY ARTERY BYPASS GRAFT N/A 05/22/2014   Procedure: CORONARY ARTERY BYPASS GRAFTING (CABG) times three using left internal mammary and right saphenous vein.;  Surgeon: Melrose Nakayama, MD;  Location: [place];  Service: Open Heart Surgery;  Laterality: N/A;  . ENDARTERECTOMY Left 02/17/2014   Procedure: ENDARTERECTOMY CAROTID WITH PATCH ANGIOPLASTY;  Surgeon: Mal Misty, MD;  Location: [place];  Service: Vascular;  Laterality: Left;  . ESOPHAGEAL DILATION N/A 08/22/2015   Procedure: ESOPHAGEAL DILATION;  Surgeon: Rogene Houston, MD;  Location: [facility];  Service: Endoscopy;  Laterality: N/A;  . ESOPHAGOGASTRODUODENOSCOPY N/A 08/22/2015   Procedure: ESOPHAGOGASTRODUODENOSCOPY (EGD);  Surgeon: Rogene Houston, MD;  Location: [facility];  Service: Endoscopy;  Laterality: N/A;  12:45 - moved to 1:55 - Ann notified pt  . EYE SURGERY     cataract extraction (right) with repair macular tear , with IOL     right  . FRACTURE SURGERY     bilateral wrist fractures- one ORIF  . JOINT REPLACEMENT  2011   left knee  . LEFT HEART CATHETERIZATION WITH CORONARY ANGIOGRAM N/A 05/22/2014   Procedure: LEFT HEART CATHETERIZATION WITH CORONARY ANGIOGRAM;  Surgeon: Leonie Man, MD;  Location: [facility] CATH LAB;  Service: Cardiovascular;  Laterality: N/A;  . LITHOTRIPSY    . PARS PLANA VITRECTOMY W/ REPAIR OF MACULAR HOLE    . RHINOPLASTY    . TEE WITHOUT CARDIOVERSION N/A 04/29/2017   Procedure: TRANSESOPHAGEAL ECHOCARDIOGRAM (TEE) WITH PROPOFOL;  Surgeon: Satira Sark, MD;  Location: [facility];  Service: Cardiovascular;  Laterality: N/A;  . TONSILLECTOMY    . TOTAL HIP ARTHROPLASTY  12/08/2011   Procedure: TOTAL HIP ARTHROPLASTY;  Surgeon: Gearlean Alf, MD;  Location: [facility];  Service: Orthopedics;  Laterality: Right;  .  UPPER GI ENDOSCOPY  12/18/2015   Procedure: UPPER GI ENDOSCOPY;  Surgeon: Ralene Ok, MD;  Location: [facility];  Service: General;;  . WRIST SURGERY Right 93yrs ago  . WRIST SURGERY Left     Social History   Socioeconomic History  . Marital status: Married    Spouse name: Not on file  . Number of children: Not on file  . Years of education: Not on file  . Highest education level: Not on file  Social Needs  . Financial resource strain: Not on file  . Food insecurity - worry: Not on file  . Food insecurity - inability: Not on file  . Transportation needs - medical: Not on file  . Transportation needs - non-medical: Not on file  Occupational History  . Occupation: retired    Comment: [place] [employer]  Tobacco Use  . Smoking status: Former Smoker    Packs/day: 1.50    Years: 25.00    Pack years: 37.50    Types: Cigarettes    Start date: 08/08/1960    Last attempt to quit: 06/02/1982    Years since quitting: 34.9  . Smokeless tobacco: Never Used  . Tobacco comment: quit smoking 30+yrs ago  Substance and Sexual Activity  . Alcohol use: No    Alcohol/week: 0.0 oz    Comment: occasionally   . Drug use: No  . Sexual activity: Yes  Other Topics Concern  . Not on file  Social History Narrative  . Not on file     Vitals:   05/04/17 1308  BP: 116/62  Pulse: 96  SpO2: 98%  Weight: 247 lb (112 kg)  Height: 5\' 10"  (1.778 m)    Wt Readings from Last 3 Encounters:  05/04/17 247 lb (112 kg)  04/30/17 240 lb 8.4 oz (109.1 kg)  04/28/17 240 lb 9.6 oz (109.1 kg)     PHYSICAL EXAM General: NAD HEENT: Normal. Neck: No JVD, no thyromegaly. Lungs: Clear to auscultation bilaterally with normal respiratory effort. CV: Regular rate and irregular rhythm, normal S1/S2, no S3, no murmur. No pretibial or periankle edema.   Abdomen: Soft, nontender, no distention.  Neurologic: Alert and oriented.  Psych: Normal affect. Skin: Normal. Musculoskeletal: No gross  deformities.    ECG: Most recent ECG reviewed.   Labs: Lab Results  Component Value Date/Time   K 4.0 04/30/2017 04:25 AM   BUN 17 04/30/2017 04:25 AM   CREATININE 1.27 (H) 04/30/2017 04:25 AM   CREATININE 1.27 07/17/2014 12:17 PM   ALT 16 (L) 04/30/2017 04:25 AM   TSH 3.595 04/28/2017 08:00 PM   TSH 2.948 05/26/2014 09:50 AM   HGB 12.6 (L) 04/29/2017 06:04 AM     Lipids: Lab Results  Component Value Date/Time   LDLCALC 75 03/22/2015 08:16 AM   CHOL 134 03/22/2015 08:16 AM   TRIG 92 03/22/2015 08:16 AM   HDL 41 03/22/2015 08:16 AM       ASSESSMENT AND PLAN: 1.  Coronary artery disease with a history of CABG: Currently on aspirin, Lipitor, metoprolol, and lisinopril.  Symptomatically stable from the standpoint.  I will stop aspirin as he is now on Xarelto.  2.  Rapid atrial fibrillation and flutter: Currently on metoprolol 25 mg twice daily.  Heart rate is controlled.  Anticoagulated with Xarelto.  Most recent hemoglobin 12.6 on 04/29/17.  He has been on amiodarone 200 mg twice daily for the last few days.  I will have him reduce the dose of amiodarone to 200 mg daily beginning December 6.  I also talked him about atrial fibrillation ablation.  3.  Hypertension: Blood pressure is normal.  No changes to therapy for now.  4.  Hyperlipidemia: Continue Lipitor 40 mg.  LDL 84 on 12/08/16.  5.  Carotid artery stenosis status post left CEA: Carotid Dopplers 03/31/17 showed near complete occlusion of the right common carotid and right internal carotid arteries with patent left carotid endarterectomy with 1-39% stenosis and evidence of hyperplasia.  Continue statin.  He is on Xarelto for atrial fibrillation.  6.  Chronic systolic heart failure: Euvolemic and stable.  Continue Lasix along with metoprolol and lisinopril.    Disposition: Follow up 6 weeks  Time spent: 40 minutes, of which greater than 50% was spent reviewing symptoms, relevant blood tests and studies, and discussing  management plan with the patient.  Kate Sable, [name], F.A.C.C.

## 2017-05-15 ENCOUNTER — Ambulatory Visit: Payer: Medicare Other | Admitting: Cardiovascular Disease

## 2017-05-18 DIAGNOSIS — N528 Other male erectile dysfunction: Secondary | ICD-10-CM | POA: Diagnosis not present

## 2017-05-18 DIAGNOSIS — E291 Testicular hypofunction: Secondary | ICD-10-CM | POA: Diagnosis not present

## 2017-05-18 DIAGNOSIS — N200 Calculus of kidney: Secondary | ICD-10-CM | POA: Diagnosis not present

## 2017-05-18 DIAGNOSIS — N401 Enlarged prostate with lower urinary tract symptoms: Secondary | ICD-10-CM | POA: Diagnosis not present

## 2017-05-18 DIAGNOSIS — N312 Flaccid neuropathic bladder, not elsewhere classified: Secondary | ICD-10-CM | POA: Diagnosis not present

## 2017-05-19 DIAGNOSIS — D485 Neoplasm of uncertain behavior of skin: Secondary | ICD-10-CM | POA: Diagnosis not present

## 2017-05-19 DIAGNOSIS — D044 Carcinoma in situ of skin of scalp and neck: Secondary | ICD-10-CM | POA: Diagnosis not present

## 2017-05-19 DIAGNOSIS — Z85828 Personal history of other malignant neoplasm of skin: Secondary | ICD-10-CM | POA: Diagnosis not present

## 2017-05-19 DIAGNOSIS — L570 Actinic keratosis: Secondary | ICD-10-CM | POA: Diagnosis not present

## 2017-05-21 ENCOUNTER — Other Ambulatory Visit: Payer: Self-pay | Admitting: Cardiovascular Disease

## 2017-05-21 MED ORDER — NITROGLYCERIN 0.4 MG SL SUBL: SUBLINGUAL_TABLET | SUBLINGUAL | 3 refills | Status: DC

## 2017-05-21 NOTE — Addendum Note (Signed)
Addended by: Merlene Laughter on: 05/21/2017 01:31 PM   Modules accepted: Orders

## 2017-06-09 ENCOUNTER — Other Ambulatory Visit: Payer: Self-pay | Admitting: Cardiovascular Disease

## 2017-06-12 DIAGNOSIS — Z6836 Body mass index (BMI) 36.0-36.9, adult: Secondary | ICD-10-CM | POA: Diagnosis not present

## 2017-06-12 DIAGNOSIS — I482 Chronic atrial fibrillation: Secondary | ICD-10-CM | POA: Diagnosis not present

## 2017-06-12 DIAGNOSIS — I10 Essential (primary) hypertension: Secondary | ICD-10-CM | POA: Diagnosis not present

## 2017-06-12 DIAGNOSIS — I5022 Chronic systolic (congestive) heart failure: Secondary | ICD-10-CM | POA: Diagnosis not present

## 2017-06-15 ENCOUNTER — Other Ambulatory Visit (HOSPITAL_COMMUNITY)
Admission: RE | Admit: 2017-06-15 | Discharge: 2017-06-15 | Disposition: A | Discharge status: Home / Self Care | Payer: Medicare Other | Source: Ambulatory Visit | Attending: Cardiovascular Disease | Admitting: Cardiovascular Disease

## 2017-06-15 ENCOUNTER — Encounter: Payer: Self-pay | Admitting: Cardiovascular Disease

## 2017-06-15 ENCOUNTER — Ambulatory Visit (INDEPENDENT_AMBULATORY_CARE_PROVIDER_SITE_OTHER): Payer: Medicare Other | Admitting: Cardiovascular Disease

## 2017-06-15 VITALS — BP 124/68 | HR 100 | Ht 70.0 in | Wt 251.0 lb

## 2017-06-15 DIAGNOSIS — I482 Chronic atrial fibrillation, unspecified: Secondary | ICD-10-CM

## 2017-06-15 DIAGNOSIS — Z9889 Other specified postprocedural states: Secondary | ICD-10-CM

## 2017-06-15 DIAGNOSIS — Z951 Presence of aortocoronary bypass graft: Secondary | ICD-10-CM | POA: Diagnosis not present

## 2017-06-15 DIAGNOSIS — I10 Essential (primary) hypertension: Secondary | ICD-10-CM | POA: Diagnosis not present

## 2017-06-15 DIAGNOSIS — Z79899 Other long term (current) drug therapy: Secondary | ICD-10-CM | POA: Diagnosis not present

## 2017-06-15 DIAGNOSIS — R600 Localized edema: Secondary | ICD-10-CM | POA: Diagnosis not present

## 2017-06-15 DIAGNOSIS — I25708 Atherosclerosis of coronary artery bypass graft(s), unspecified, with other forms of angina pectoris: Secondary | ICD-10-CM

## 2017-06-15 DIAGNOSIS — I6521 Occlusion and stenosis of right carotid artery: Secondary | ICD-10-CM | POA: Diagnosis not present

## 2017-06-15 DIAGNOSIS — I209 Angina pectoris, unspecified: Secondary | ICD-10-CM | POA: Diagnosis not present

## 2017-06-15 LAB — HEPATIC FUNCTION PANEL
ALT: 16 U/L — AB (ref 17–63)
AST: 13 U/L — ABNORMAL LOW (ref 15–41)
Albumin: 3.8 g/dL (ref 3.5–5.0)
Alkaline Phosphatase: 78 U/L (ref 38–126)
BILIRUBIN INDIRECT: 0.4 mg/dL (ref 0.3–0.9)
Bilirubin, Direct: 0.2 mg/dL (ref 0.1–0.5)
TOTAL PROTEIN: 7.1 g/dL (ref 6.5–8.1)
Total Bilirubin: 0.6 mg/dL (ref 0.3–1.2)

## 2017-06-15 LAB — TSH: TSH: 3.705 u[IU]/mL (ref 0.350–4.500)

## 2017-06-15 MED ORDER — METOPROLOL TARTRATE 50 MG PO TABS: 50.0000 mg | ORAL_TABLET | Freq: Two times a day (BID) | ORAL | 3 refills | Status: DC

## 2017-06-15 NOTE — Progress Notes (Signed)
SUBJECTIVE: The patient presents for follow up atrial fibrillation and coronary artery disease.  He denies chest pain and increasing exertional dyspnea. He denies palpitations. He has been using Lasix more frequently and uses a left leg compression stocking.  He sleeps with CPAP.  He and his wife have several questions regarding medical management of atrial fibrillation and we again discussed ablation candidacy.  Left atrium is mildly enlarged.  Review of Systems: As per "subjective", otherwise negative.  Allergies  Allergen Reactions  . Codeine Sulfate Nausea Only    Current Outpatient Medications  Medication Sig Dispense Refill  . amiodarone (PACERONE) 200 MG tablet Take 200 mg daily starting Thursday 05/07/2017. 90 tablet 3  . atorvastatin (LIPITOR) 40 MG tablet TAKE 1 TABLET BY MOUTH IN THE EVENING 90 tablet 2  . colchicine 0.6 MG tablet Take 0.6 mg by mouth as needed (gout).     . cyclobenzaprine (FLEXERIL) 10 MG tablet TAKE 1 TABLET BY MOUTH EVERYDAY AT BEDTIME (STOP METHOCARBAMOL)  1  . doxazosin (CARDURA) 2 MG tablet Take 1 tablet (2 mg total) by mouth daily. (Patient taking differently: Take 4 mg by mouth daily. ) 90 tablet 3  . finasteride (PROSCAR) 5 MG tablet Take 5 mg by mouth at bedtime.     . furosemide (LASIX) 40 MG tablet TAKE 1 TABLET BY MOUTH TWICE A WEEK AS DIRECTED 335 tablet 0  . lisinopril (PRINIVIL,ZESTRIL) 40 MG tablet Take 40 mg by mouth daily.    . metoprolol tartrate (LOPRESSOR) 25 MG tablet Take 1 tablet (25 mg total) by mouth 2 (two) times daily. 60 tablet 2  . nitroGLYCERIN (NITROSTAT) 0.4 MG SL tablet PLACE 1 TABLET UNDER THE TONGUE EVERY 5 MINUTES AS NEEDED FOR CHEST PAIN 25 tablet 3  . pantoprazole (PROTONIX) 40 MG tablet Take 1 tablet (40 mg total) by mouth daily before breakfast. (Patient taking differently: Take 40 mg by mouth at bedtime. ) 90 tablet 3  . potassium chloride SA (K-DUR,KLOR-CON) 20 MEQ tablet Take 1 tablet (20 mEq total) by  mouth as directed. 2 times per wk. 90 tablet 3  . rivaroxaban (XARELTO) 20 MG TABS tablet Take 1 tablet (20 mg total) by mouth daily. 30 tablet 2   No current facility-administered medications for this visit.     Past Medical History:  Diagnosis Date  . Arthritis   . Basal cell carcinoma   . Carotid stenosis   . Chronic back pain   . Coronary artery disease    Multvessel s/p CABG 2015  . Enlarged prostate   . Essential hypertension   . GERD (gastroesophageal reflux disease)   . Gout   . History of colon polyps   . History of kidney stones   . Hyperlipidemia   . Lumbar disc disease   . Myocardial infarction ([place])    2015  . Postoperative atrial fibrillation ([place])   . Sleep apnea    CPAP  . Urinary urgency   . Wears glasses     Past Surgical History:  Procedure Laterality Date  . BACK SURGERY    . CARDIAC CATHETERIZATION  05/22/2014   Procedure: IABP INSERTION;  Surgeon: Leonie Man, MD;  Location: [facility] CATH LAB;  Service: Cardiovascular;;  . CARDIOVERSION N/A 04/29/2017   Procedure: CARDIOVERSION;  Surgeon: Satira Sark, MD;  [facility];  Service: Cardiovascular;  Laterality: N/A;  . Cataract surgery Right   . COLONOSCOPY    . CORONARY ARTERY BYPASS GRAFT N/A  05/22/2014   Procedure: CORONARY ARTERY BYPASS GRAFTING (CABG) times three using left internal mammary and right saphenous vein.;  Surgeon: Melrose Nakayama, MD;  Location: [place];  Service: Open Heart Surgery;  Laterality: N/A;  . ENDARTERECTOMY Left 02/17/2014   Procedure: ENDARTERECTOMY CAROTID WITH PATCH ANGIOPLASTY;  Surgeon: Mal Misty, MD;  Location: [place];  Service: Vascular;  Laterality: Left;  . ESOPHAGEAL DILATION N/A 08/22/2015   Procedure: ESOPHAGEAL DILATION;  Surgeon: Rogene Houston, MD;  [facility];  Service: Endoscopy;  Laterality: N/A;  . ESOPHAGOGASTRODUODENOSCOPY N/A 08/22/2015   Procedure: ESOPHAGOGASTRODUODENOSCOPY (EGD);  Surgeon: Rogene Houston, MD;   [facility];  Service: Endoscopy;  Laterality: N/A;  12:45 - moved to 1:55 - Ann notified pt  . EYE SURGERY     cataract extraction (right) with repair macular tear , with IOL     right  . FRACTURE SURGERY     bilateral wrist fractures- one ORIF  . JOINT REPLACEMENT  2011   left knee  . LEFT HEART CATHETERIZATION WITH CORONARY ANGIOGRAM N/A 05/22/2014   Procedure: LEFT HEART CATHETERIZATION WITH CORONARY ANGIOGRAM;  Surgeon: Leonie Man, MD;  Location[facility] CATH LAB;  Service: Cardiovascular;  Laterality: N/A;  . LITHOTRIPSY    . PARS PLANA VITRECTOMY W/ REPAIR OF MACULAR HOLE    . RHINOPLASTY    . TEE WITHOUT CARDIOVERSION N/A 04/29/2017   Procedure: TRANSESOPHAGEAL ECHOCARDIOGRAM (TEE) WITH PROPOFOL;  Surgeon: Satira Sark, MD;  [facility];  Service: Cardiovascular;  Laterality: N/A;  . TONSILLECTOMY    . TOTAL HIP ARTHROPLASTY  12/08/2011   Procedure: TOTAL HIP ARTHROPLASTY;  Surgeon: Gearlean Alf, MD;  [facility];  Service: Orthopedics;  Laterality: Right;  . UPPER GI ENDOSCOPY  12/18/2015   Procedure: UPPER GI ENDOSCOPY;  Surgeon: Ralene Ok, MD;  [facility];  Service: General;;  . WRIST SURGERY Right 30yrs ago  . WRIST SURGERY Left     Social History   Socioeconomic History  . Marital status: Married    Spouse name: Not on file  . Number of children: Not on file  . Years of education: Not on file  . Highest education level: Not on file  Social Needs  . Financial resource strain: Not on file  . Food insecurity - worry: Not on file  . Food insecurity - inability: Not on file  . Transportation needs - medical: Not on file  . Transportation needs - non-medical: Not on file  Occupational History  . Occupation: retired    Comment: [place] [employer]  Tobacco Use  . Smoking status: Former Smoker    Packs/day: 1.50    Years: 25.00    Pack years: 37.50    Types: Cigarettes    Start date: 08/08/1960    Last attempt to  quit: 06/02/1982    Years since quitting: 35.0  . Smokeless tobacco: Never Used  . Tobacco comment: quit smoking 30+yrs ago  Substance and Sexual Activity  . Alcohol use: No    Alcohol/week: 0.0 oz    Comment: occasionally   . Drug use: No  . Sexual activity: Yes  Other Topics Concern  . Not on file  Social History Narrative  . Not on file     Vitals:   06/15/17 1334  BP: 124/68  Pulse: 100  SpO2: 95%  Weight: 251 lb (113.9 kg)  Height: 5\' 10"  (1.778 m)    Wt Readings from Last  3 Encounters:  06/15/17 251 lb (113.9 kg)  05/04/17 247 lb (112 kg)  04/30/17 240 lb 8.4 oz (109.1 kg)     PHYSICAL EXAM General: NAD HEENT: Normal. Neck: No JVD, no thyromegaly. Lungs: Clear to auscultation bilaterally with normal respiratory effort. CV: Regular rate and irregular rhythm (HR 84-88 bpm by auscultation), normal S1/S2, no S3, no murmur. Left leg in compression stocking.  No carotid bruit.   Abdomen: Firm, protuberant.  Neurologic: Alert and oriented.  Psych: Normal affect. Skin: Normal. Musculoskeletal: No gross deformities.    ECG: Most recent ECG reviewed.   Labs: Lab Results  Component Value Date/Time   K 4.0 04/30/2017 04:25 AM   BUN 17 04/30/2017 04:25 AM   CREATININE 1.27 (H) 04/30/2017 04:25 AM   CREATININE 1.27 07/17/2014 12:17 PM   ALT 16 (L) 04/30/2017 04:25 AM   TSH 3.595 04/28/2017 08:00 PM   TSH 2.948 05/26/2014 09:50 AM   HGB 12.6 (L) 04/29/2017 06:04 AM     Lipids: Lab Results  Component Value Date/Time   LDLCALC 75 03/22/2015 08:16 AM   CHOL 134 03/22/2015 08:16 AM   TRIG 92 03/22/2015 08:16 AM   HDL 41 03/22/2015 08:16 AM       ASSESSMENT AND PLAN:  1.  Coronary artery disease with a history of CABG: Currently on Lipitor, metoprolol, and lisinopril.  Symptomatically stable from this standpoint.   2.  Rapid atrial fibrillation and flutter: Currently on metoprolol 25 mg twice daily.  Heart rate is elevated. I will increase to 50 mg bid.   Anticoagulated with Xarelto.  Most recent hemoglobin 12.6 on 04/29/17.  I will continue amiodarone 200 mg daily for the time being but if he remains in a fib, I will stop it.  I again talked to him about atrial fibrillation ablation. He prefers medical therapy for the time being. Left atrium is mildly enlarged. As he is on amiodarone, I will check TSH and LFT's.  3.  Hypertension: Blood pressure is normal.  I will monitor given increase in metoprolol dose as noted above.  4.  Hyperlipidemia: Continue Lipitor 40 mg.  LDL 84 on 12/08/16.  5.  Carotid artery stenosis status post left CEA: Carotid Dopplers 03/31/17 showed near complete occlusion of the right common carotid and right internal carotid arteries with patent left carotid endarterectomy with 1-39% stenosis and evidence of hyperplasia.  Continue statin.  He is on Xarelto for atrial fibrillation.  6.  Chronic systolic heart failure: Symptomatically stable. Wt up 4 lbs since last visit.  Continue Lasix along with metoprolol (increase to 50 mg bid for HR control) and lisinopril.    Disposition: Follow up 8-10 weeks.   Kate Sable, [name], F.A.C.C.

## 2017-06-15 NOTE — Patient Instructions (Signed)
Your physician recommends that you schedule a follow-up appointment in:  8-10 weeks with Dr.Koneswaran     INCREASE Metoprolol to 50 mg twice a day     Get TSH and LFT lab work    No tests ordered today.    Thank you for choosing [facility] !

## 2017-06-16 ENCOUNTER — Other Ambulatory Visit: Payer: Self-pay | Admitting: Cardiovascular Disease

## 2017-06-16 NOTE — Telephone Encounter (Signed)
Filled amiod[facility]one 200 [facility] daily to [facility]

## 2017-06-18 DIAGNOSIS — C4442 Squamous cell carcinoma of skin of scalp and neck: Secondary | ICD-10-CM | POA: Diagnosis not present

## 2017-06-22 ENCOUNTER — Telehealth: Payer: Self-pay | Admitting: Cardiovascular Disease

## 2017-06-22 NOTE — Telephone Encounter (Signed)
Wife states increasing metoprolol to 50 mg BID and is so fatigued he can barely walk. She winders if he can increase his beta blocker more slowly.

## 2017-06-22 NOTE — Telephone Encounter (Signed)
Please give pt's wife a call --pt is having some problems w/ the metoprolol tartrate (LOPRESSOR) 50 MG tablet [[id number]]

## 2017-06-23 MED ORDER — METOPROLOL TARTRATE 25 MG PO TABS: 25.0000 mg | ORAL_TABLET | Freq: Two times a day (BID) | ORAL | 3 refills | Status: DC

## 2017-06-23 NOTE — Telephone Encounter (Signed)
Can reduce to 37.5 mg bid.

## 2017-06-23 NOTE — Telephone Encounter (Signed)
Patient wants to go back to 25 mg BID,Dr.Koneswaran agrees.Patient will call if sx's return

## 2017-07-07 ENCOUNTER — Telehealth: Payer: Self-pay | Admitting: Cardiovascular Disease

## 2017-07-07 NOTE — Telephone Encounter (Signed)
Patient states that after starting new meds from last OV, he hasn't felt right. Complaint of being tired all the time and SOB. Please call patient with questions and/or instructions. / tg

## 2017-07-07 NOTE — Telephone Encounter (Signed)
Patient made apt for Dr.Allred for 07/13/17 at 2:30 pm to be seen in [facility]

## 2017-07-07 NOTE — Telephone Encounter (Signed)
Is he willing to cut back to 25 mg bid? Please have him see [name] as well for further suggestions.

## 2017-07-07 NOTE — Telephone Encounter (Signed)
Patient cannot tolerate beta blocker and wants to syop

## 2017-07-13 ENCOUNTER — Ambulatory Visit (INDEPENDENT_AMBULATORY_CARE_PROVIDER_SITE_OTHER): Payer: Medicare Other | Admitting: Internal Medicine

## 2017-07-13 ENCOUNTER — Encounter: Payer: Self-pay | Admitting: Internal Medicine

## 2017-07-13 VITALS — BP 104/66 | HR 94 | Ht 70.0 in | Wt 240.0 lb

## 2017-07-13 DIAGNOSIS — I481 Persistent atrial fibrillation: Secondary | ICD-10-CM | POA: Diagnosis not present

## 2017-07-13 DIAGNOSIS — G4733 Obstructive sleep apnea (adult) (pediatric): Secondary | ICD-10-CM

## 2017-07-13 DIAGNOSIS — I4891 Unspecified atrial fibrillation: Secondary | ICD-10-CM | POA: Diagnosis not present

## 2017-07-13 DIAGNOSIS — I4819 Other persistent atrial fibrillation: Secondary | ICD-10-CM

## 2017-07-13 NOTE — Patient Instructions (Addendum)
Medication Instructions:  Please continue all medications as listed.  Labwork: TODAY - BMET/CBC   Testing/Procedures:  Your physician has requested that you have cardiac CT (within 7 days prior to ablation). Cardiac computed tomography (CT) is a painless test that uses an x-ray machine to take clear, detailed pictures of your heart. For further information please visit [email or web address]. Please follow instruction sheet as given.   Your physician has recommended that you have an ablation on 07/28/17. Catheter ablation is a medical procedure used to treat some cardiac arrhythmias (irregular heartbeats). During catheter ablation, a long, thin, flexible tube is put into a blood vessel in your groin (upper thigh), or neck. This tube is called an ablation catheter. It is then guided to your heart through the blood vessel. Radio frequency waves destroy small areas of heart tissue where abnormal heartbeats may cause an arrhythmia to start. Please see the instruction sheet given to you today.  Follow-Up: Follow-up appointment in 4 weeks at the [facility] with Roderic Palau, NP and 3 months with [name].   Any Other Special Instructions Will Be Listed Below (If Applicable).  Please report to the [facility] 07/28/17 at 9:30 AM  Nothing to eat or drink after midnight the night prior to procedure  Do not take any medications the morning of the procedure  Plan 1 night stay   Someone will need to drive you home at discharge  Please arrive at the [facility] main entrance of [facility] on _____________ at _________AM (30-45 minutes prior to test start time)  [facility] [place], [place] [address] [phone]  Proceed to the [facility] Radiology Department (First Floor).  Please follow these instructions carefully (unless otherwise directed):  Hold all erectile dysfunction medications at least 48 hours prior to  test.  On the Night Before the Test: . Drink plenty of water. . Do not consume any caffeinated/decaffeinated beverages or chocolate 12 hours prior to your test. . Do not take any antihistamines 12 hours prior to your test. . If you take Metformin do not take 24 hours prior to test.  On the Day of the Test: . Drink plenty of water. Do not drink any water within one hour of the test. . Do not eat any food 4 hours prior to the test. . You may take your regular medications prior to the test. . HOLD Furosemide morning of the test.  After the Test: . Drink plenty of water. . After receiving IV contrast, you may experience a mild flushed feeling. This is normal. . On occasion, you may experience a mild rash up to 24 hours after the test. This is not dangerous. If this occurs, you can take Benadryl 25 mg and increase your fluid intake. . If you experience trouble breathing, this can be serious. If it is severe call 911 IMMEDIATELY. If it is mild, please call our office. . If you take any of these medications: Glipizide/Metformin, Avandament, Glucavance, please do not take 48 hours after completing test.    If you need a refill on your cardiac medications before your next appointment, please call your pharmacy.      Any Other Special Instructions Will Be Listed Below (If Applicable).     If you need a refill on your cardiac medications before your next appointment, please call your pharmacy.

## 2017-07-13 NOTE — Progress Notes (Signed)
Electrophysiology Office Note   Date:  07/13/2017   ID:  Lacorey, Brusca [birth year], MRN [id number]  PCP:  Asencion Noble, MD  Cardiologist:  Dr Bronson Ing Primary Electrophysiologist: Thompson Grayer, MD    Chief Complaint  Patient presents with  . New Patient (Initial Visit)    Afib     History of Present Illness: Caleb Wolf is a 77 y.o. male who presents today for electrophysiology evaluation.   He is referred by Dr Bronson Ing for EP consultation regarding afib.  He was initially diagnosed with atrial flutter 11/18 after presenting with SOB and fatigue.  In retrospect, he thinks that he has had afib a fair bit longer.  He was evaluated by Dr Bronson Ing and underwent cardioversion.  Unfortunately, he returned to afib within 24 hours.   He reports ongoing issues with fatigue and decreased exercise tolerance.  He has been placed on amiodarone but remains in afib.  Today, he denies symptoms of palpitations, chest pain, shortness of breath, orthopnea, PND, lower extremity edema, claudication, dizziness, presyncope, syncope, bleeding, or neurologic sequela. The patient is tolerating medications without difficulties and is otherwise without complaint today.    Past Medical History:  Diagnosis Date  . Arthritis   . Basal cell carcinoma   . Carotid stenosis   . Chronic back pain   . Coronary artery disease    Multvessel s/p CABG 2015  . Enlarged prostate   . Essential hypertension   . GERD (gastroesophageal reflux disease)   . Gout   . History of colon polyps   . History of kidney stones   . Hyperlipidemia   . Lumbar disc disease   . Myocardial infarction ([place])    2015  . Postoperative atrial fibrillation ([place])   . Sleep apnea    CPAP  . Urinary urgency   . Wears glasses    Past Surgical History:  Procedure Laterality Date  . BACK SURGERY    . CARDIAC CATHETERIZATION  05/22/2014   Procedure: IABP INSERTION;  Surgeon: Leonie Man, MD;  Location: [facility] CATH LAB;  Service:  Cardiovascular;;  . CARDIOVERSION N/A 04/29/2017   Procedure: CARDIOVERSION;  Surgeon: Satira Sark, MD;  [facility];  Service: Cardiovascular;  Laterality: N/A;  . Cataract surgery Right   . COLONOSCOPY    . CORONARY ARTERY BYPASS GRAFT N/A 05/22/2014   Procedure: CORONARY ARTERY BYPASS GRAFTING (CABG) times three using left internal mammary and right saphenous vein.;  Surgeon: Melrose Nakayama, MD;  Location: [place];  Service: Open Heart Surgery;  Laterality: N/A;  . ENDARTERECTOMY Left 02/17/2014   Procedure: ENDARTERECTOMY CAROTID WITH PATCH ANGIOPLASTY;  Surgeon: Mal Misty, MD;  Location: [place];  Service: Vascular;  Laterality: Left;  . ESOPHAGEAL DILATION N/A 08/22/2015   Procedure: ESOPHAGEAL DILATION;  Surgeon: Rogene Houston, MD;  [facility];  Service: Endoscopy;  Laterality: N/A;  . ESOPHAGOGASTRODUODENOSCOPY N/A 08/22/2015   Procedure: ESOPHAGOGASTRODUODENOSCOPY (EGD);  Surgeon: Rogene Houston, MD;  [facility];  Service: Endoscopy;  Laterality: N/A;  12:45 - moved to 1:55 - Ann notified pt  . EYE SURGERY     cataract extraction (right) with repair macular tear , with IOL     right  . FRACTURE SURGERY     bilateral wrist fractures- one ORIF  . JOINT REPLACEMENT  2011   left knee  . LEFT HEART CATHETERIZATION WITH CORONARY ANGIOGRAM N/A 05/22/2014   Procedure: LEFT HEART CATHETERIZATION WITH CORONARY  Cyril Loosen;  Surgeon: Leonie Man, MD;  Location: [facility] CATH LAB;  Service: Cardiovascular;  Laterality: N/A;  . LITHOTRIPSY    . PARS PLANA VITRECTOMY W/ REPAIR OF MACULAR HOLE    . RHINOPLASTY    . TEE WITHOUT CARDIOVERSION N/A 04/29/2017   Procedure: TRANSESOPHAGEAL ECHOCARDIOGRAM (TEE) WITH PROPOFOL;  Surgeon: Satira Sark, MD;  [facility];  Service: Cardiovascular;  Laterality: N/A;  . TONSILLECTOMY    . TOTAL HIP ARTHROPLASTY  12/08/2011   Procedure: TOTAL HIP ARTHROPLASTY;  Surgeon: Gearlean Alf, MD;   [facility];  Service: Orthopedics;  Laterality: Right;  . UPPER GI ENDOSCOPY  12/18/2015   Procedure: UPPER GI ENDOSCOPY;  Surgeon: Ralene Ok, MD;  [facility];  Service: General;;  . WRIST SURGERY Right 52yrs ago  . WRIST SURGERY Left      Current Outpatient Medications  Medication Sig Dispense Refill  . amiodarone (PACERONE) 200 MG tablet Take 1 tablet (200 mg total) by mouth daily. 90 tablet 3  . atorvastatin (LIPITOR) 40 MG tablet TAKE 1 TABLET BY MOUTH IN THE EVENING 90 tablet 2  . colchicine 0.6 MG tablet Take 0.6 mg by mouth as needed (gout).     . cyclobenzaprine (FLEXERIL) 10 MG tablet TAKE 1 TABLET BY MOUTH EVERYDAY AT BEDTIME (STOP METHOCARBAMOL)  1  . doxazosin (CARDURA) 4 MG tablet Take 4 mg by mouth daily.  4  . finasteride (PROSCAR) 5 MG tablet Take 5 mg by mouth at bedtime.     . furosemide (LASIX) 40 MG tablet TAKE 1 TABLET BY MOUTH TWICE A WEEK AS DIRECTED 335 tablet 0  . lisinopril (PRINIVIL,ZESTRIL) 40 MG tablet Take 40 mg by mouth daily.    . metoprolol tartrate (LOPRESSOR) 25 MG tablet Take 1 tablet (25 mg total) by mouth 2 (two) times daily. 180 tablet 3  . nitroGLYCERIN (NITROSTAT) 0.4 MG SL tablet PLACE 1 TABLET UNDER THE TONGUE EVERY 5 MINUTES AS NEEDED FOR CHEST PAIN 25 tablet 3  . pantoprazole (PROTONIX) 40 MG tablet Take 1 tablet (40 mg total) by mouth daily before breakfast. (Patient taking differently: Take 40 mg by mouth at bedtime. ) 90 tablet 3  . potassium chloride SA (K-DUR,KLOR-CON) 20 MEQ tablet Take 1 tablet (20 mEq total) by mouth as directed. 2 times per wk. 90 tablet 3  . rivaroxaban (XARELTO) 20 MG TABS tablet Take 1 tablet (20 mg total) by mouth daily. 30 tablet 2   No current facility-administered medications for this visit.     Allergies:   Codeine sulfate   Social History:  The patient  reports that he quit smoking about 35 years ago. His smoking use included cigarettes. He started smoking about 56 years ago. He has a 37.50  pack-year smoking history. he has never used smokeless tobacco. He reports that he does not drink alcohol or use drugs.   Family History:  The patient's  family history includes Allergies in his mother; Heart disease in his father and mother; Hypertension in his mother.    ROS:  Please see the history of present illness.   All other systems are personally reviewed and negative.    PHYSICAL EXAM: VS:  BP 104/66   Pulse 94   Ht 5\' 10"  (1.778 m)   Wt 240 lb (108.9 kg)   BMI 34.44 kg/m  , BMI Body mass index is 34.44 kg/m. GEN: Well nourished, well developed, in no acute distress  HEENT: normal  Neck: no JVD,  carotid bruits, or masses Cardiac: iRRR; no murmurs, rubs, or gallops,no edema  Respiratory:  clear to auscultation bilaterally, normal work of breathing GI: soft, nontender, nondistended, + BS MS: no deformity or atrophy  Skin: warm and dry  Neuro:  Strength and sensation are intact Psych: euthymic mood, full affect  EKG:  EKG is ordered today. The ekg ordered today is personally reviewed and shows afib, V rate 94 bpm, anteroseptal infarct, Qtc 450 msec   Recent Labs: 04/29/2017: B Natriuretic Peptide 594.0; Hemoglobin 12.6; Platelets 159 04/30/2017: BUN 17; Creatinine, Ser 1.27; Magnesium 1.9; Potassium 4.0; Sodium 141 06/15/2017: ALT 16; TSH 3.705  personally reviewed   Lipid Panel     Component Value Date/Time   CHOL 134 03/22/2015 0816   TRIG 92 03/22/2015 0816   HDL 41 03/22/2015 0816   CHOLHDL 3.3 03/22/2015 0816   VLDL 18 03/22/2015 0816   LDLCALC 75 03/22/2015 0816   personally reviewed   Wt Readings from Last 3 Encounters:  07/13/17 240 lb (108.9 kg)  06/15/17 251 lb (113.9 kg)  05/04/17 247 lb (112 kg)      Other studies personally reviewed: Additional studies/ records that were reviewed today include: echo 04/29/17 reveals Ef 45-50%, mild MR, LA 44 mm, Dr Court Joy notes Review of the above records today demonstrates: as above   ASSESSMENT  AND PLAN:  1.  Persistent atrial fibrillation The patient has symptomatic persistent afib.  He has failed medical therapy with amiodarone. Therapeutic strategies for afib including medicine and ablation were discussed in detail with the patient today. Risk, benefits, and alternatives to EP study and radiofrequency ablation for afib were also discussed in detail today. These risks include but are not limited to stroke, bleeding, vascular damage, tamponade, perforation, damage to the esophagus, lungs, and other structures, pulmonary vein stenosis, worsening renal function, and death. The patient understands these risk and wishes to proceed.  We will therefore proceed with catheter ablation at the next available time.  Will plan cardiac CT prior to ablation.  Importance of compliance with xarelto discussed with the patient.  2. OSA Compliance with CPAP advised  3. Obesity Body mass index is 34.44 kg/m. Lifestyle modification encouraged today    Labs/ tests ordered today include:  Orders Placed This Encounter  Procedures  . EKG 12-Lead     Signed, Thompson Grayer, MD  07/13/2017 3:04 PM     [place] [address] [phone] (office) [phone] (fax)

## 2017-07-13 NOTE — H&P (View-Only) (Signed)
Electrophysiology Office Note   Date:  07/13/2017   ID:  Caleb Wolf, Caleb Wolf [birth year], MRN [id number]  PCP:  Asencion Noble, MD  Cardiologist:  Dr Bronson Ing Primary Electrophysiologist: Thompson Grayer, MD    Chief Complaint  Patient presents with  . New Patient (Initial Visit)    Afib     History of Present Illness: Caleb Wolf is a 76 y.o. male who presents today for electrophysiology evaluation.   He is referred by Dr Bronson Ing for EP consultation regarding afib.  He was initially diagnosed with atrial flutter 11/18 after presenting with SOB and fatigue.  In retrospect, he thinks that he has had afib a fair bit longer.  He was evaluated by Dr Bronson Ing and underwent cardioversion.  Unfortunately, he returned to afib within 24 hours.   He reports ongoing issues with fatigue and decreased exercise tolerance.  He has been placed on amiodarone but remains in afib.  Today, he denies symptoms of palpitations, chest pain, shortness of breath, orthopnea, PND, lower extremity edema, claudication, dizziness, presyncope, syncope, bleeding, or neurologic sequela. The patient is tolerating medications without difficulties and is otherwise without complaint today.    Past Medical History:  Diagnosis Date  . Arthritis   . Basal cell carcinoma   . Carotid stenosis   . Chronic back pain   . Coronary artery disease    Multvessel s/p CABG 2015  . Enlarged prostate   . Essential hypertension   . GERD (gastroesophageal reflux disease)   . Gout   . History of colon polyps   . History of kidney stones   . Hyperlipidemia   . Lumbar disc disease   . Myocardial infarction ([place])    2015  . Postoperative atrial fibrillation ([place])   . Sleep apnea    CPAP  . Urinary urgency   . Wears glasses    Past Surgical History:  Procedure Laterality Date  . BACK SURGERY    . CARDIAC CATHETERIZATION  05/22/2014   Procedure: IABP INSERTION;  Surgeon: Leonie Man, MD;  Location: [facility] CATH LAB;  Service:  Cardiovascular;;  . CARDIOVERSION N/A 04/29/2017   Procedure: CARDIOVERSION;  Surgeon: Satira Sark, MD;  [facility];  Service: Cardiovascular;  Laterality: N/A;  . Cataract surgery Right   . COLONOSCOPY    . CORONARY ARTERY BYPASS GRAFT N/A 05/22/2014   Procedure: CORONARY ARTERY BYPASS GRAFTING (CABG) times three using left internal mammary and right saphenous vein.;  Surgeon: Melrose Nakayama, MD;  Location: [place];  Service: Open Heart Surgery;  Laterality: N/A;  . ENDARTERECTOMY Left 02/17/2014   Procedure: ENDARTERECTOMY CAROTID WITH PATCH ANGIOPLASTY;  Surgeon: Mal Misty, MD;  Location: [place];  Service: Vascular;  Laterality: Left;  . ESOPHAGEAL DILATION N/A 08/22/2015   Procedure: ESOPHAGEAL DILATION;  Surgeon: Rogene Houston, MD;  [facility];  Service: Endoscopy;  Laterality: N/A;  . ESOPHAGOGASTRODUODENOSCOPY N/A 08/22/2015   Procedure: ESOPHAGOGASTRODUODENOSCOPY (EGD);  Surgeon: Rogene Houston, MD;  [facility];  Service: Endoscopy;  Laterality: N/A;  12:45 - moved to 1:55 - Ann notified pt  . EYE SURGERY     cataract extraction (right) with repair macular tear , with IOL     right  . FRACTURE SURGERY     bilateral wrist fractures- one ORIF  . JOINT REPLACEMENT  2011   left knee  . LEFT HEART CATHETERIZATION WITH CORONARY ANGIOGRAM N/A 05/22/2014   Procedure: LEFT HEART CATHETERIZATION WITH CORONARY  Cyril Loosen;  Surgeon: Leonie Man, MD;  Location: [facility] CATH LAB;  Service: Cardiovascular;  Laterality: N/A;  . LITHOTRIPSY    . PARS PLANA VITRECTOMY W/ REPAIR OF MACULAR HOLE    . RHINOPLASTY    . TEE WITHOUT CARDIOVERSION N/A 04/29/2017   Procedure: TRANSESOPHAGEAL ECHOCARDIOGRAM (TEE) WITH PROPOFOL;  Surgeon: Satira Sark, MD;  [facility];  Service: Cardiovascular;  Laterality: N/A;  . TONSILLECTOMY    . TOTAL HIP ARTHROPLASTY  12/08/2011   Procedure: TOTAL HIP ARTHROPLASTY;  Surgeon: Gearlean Alf, MD;   Location: [facility];  Service: Orthopedics;  Laterality: Right;  . UPPER GI ENDOSCOPY  12/18/2015   Procedure: UPPER GI ENDOSCOPY;  Surgeon: Ralene Ok, MD;  Location: [facility];  Service: General;;  . WRIST SURGERY Right 33yrs ago  . WRIST SURGERY Left      Current Outpatient Medications  Medication Sig Dispense Refill  . amiodarone (PACERONE) 200 MG tablet Take 1 tablet (200 mg total) by mouth daily. 90 tablet 3  . atorvastatin (LIPITOR) 40 MG tablet TAKE 1 TABLET BY MOUTH IN THE EVENING 90 tablet 2  . colchicine 0.6 MG tablet Take 0.6 mg by mouth as needed (gout).     . cyclobenzaprine (FLEXERIL) 10 MG tablet TAKE 1 TABLET BY MOUTH EVERYDAY AT BEDTIME (STOP METHOCARBAMOL)  1  . doxazosin (CARDURA) 4 MG tablet Take 4 mg by mouth daily.  4  . finasteride (PROSCAR) 5 MG tablet Take 5 mg by mouth at bedtime.     . furosemide (LASIX) 40 MG tablet TAKE 1 TABLET BY MOUTH TWICE A WEEK AS DIRECTED 335 tablet 0  . lisinopril (PRINIVIL,ZESTRIL) 40 MG tablet Take 40 mg by mouth daily.    . metoprolol tartrate (LOPRESSOR) 25 MG tablet Take 1 tablet (25 mg total) by mouth 2 (two) times daily. 180 tablet 3  . nitroGLYCERIN (NITROSTAT) 0.4 MG SL tablet PLACE 1 TABLET UNDER THE TONGUE EVERY 5 MINUTES AS NEEDED FOR CHEST PAIN 25 tablet 3  . pantoprazole (PROTONIX) 40 MG tablet Take 1 tablet (40 mg total) by mouth daily before breakfast. (Patient taking differently: Take 40 mg by mouth at bedtime. ) 90 tablet 3  . potassium chloride SA (K-DUR,KLOR-CON) 20 MEQ tablet Take 1 tablet (20 mEq total) by mouth as directed. 2 times per wk. 90 tablet 3  . rivaroxaban (XARELTO) 20 MG TABS tablet Take 1 tablet (20 mg total) by mouth daily. 30 tablet 2   No current facility-administered medications for this visit.     Allergies:   Codeine sulfate   Social History:  The patient  reports that he quit smoking about 35 years ago. His smoking use included cigarettes. He started smoking about 56 years ago. He has a 37.50  pack-year smoking history. he has never used smokeless tobacco. He reports that he does not drink alcohol or use drugs.   Family History:  The patient's  family history includes Allergies in his mother; Heart disease in his father and mother; Hypertension in his mother.    ROS:  Please see the history of present illness.   All other systems are personally reviewed and negative.    PHYSICAL EXAM: VS:  BP 104/66   Pulse 94   Ht 5\' 10"  (1.778 m)   Wt 240 lb (108.9 kg)   BMI 34.44 kg/m  , BMI Body mass index is 34.44 kg/m. GEN: Well nourished, well developed, in no acute distress  HEENT: normal  Neck: no JVD,  carotid bruits, or masses Cardiac: iRRR; no murmurs, rubs, or gallops,no edema  Respiratory:  clear to auscultation bilaterally, normal work of breathing GI: soft, nontender, nondistended, + BS MS: no deformity or atrophy  Skin: warm and dry  Neuro:  Strength and sensation are intact Psych: euthymic mood, full affect  EKG:  EKG is ordered today. The ekg ordered today is personally reviewed and shows afib, V rate 94 bpm, anteroseptal infarct, Qtc 450 msec   Recent Labs: 04/29/2017: B Natriuretic Peptide 594.0; Hemoglobin 12.6; Platelets 159 04/30/2017: BUN 17; Creatinine, Ser 1.27; Magnesium 1.9; Potassium 4.0; Sodium 141 06/15/2017: ALT 16; TSH 3.705  personally reviewed   Lipid Panel     Component Value Date/Time   CHOL 134 03/22/2015 0816   TRIG 92 03/22/2015 0816   HDL 41 03/22/2015 0816   CHOLHDL 3.3 03/22/2015 0816   VLDL 18 03/22/2015 0816   LDLCALC 75 03/22/2015 0816   personally reviewed   Wt Readings from Last 3 Encounters:  07/13/17 240 lb (108.9 kg)  06/15/17 251 lb (113.9 kg)  05/04/17 247 lb (112 kg)      Other studies personally reviewed: Additional studies/ records that were reviewed today include: echo 04/29/17 reveals Ef 45-50%, mild MR, LA 44 mm, Dr Court Joy notes Review of the above records today demonstrates: as above   ASSESSMENT  AND PLAN:  1.  Persistent atrial fibrillation The patient has symptomatic persistent afib.  He has failed medical therapy with amiodarone. Therapeutic strategies for afib including medicine and ablation were discussed in detail with the patient today. Risk, benefits, and alternatives to EP study and radiofrequency ablation for afib were also discussed in detail today. These risks include but are not limited to stroke, bleeding, vascular damage, tamponade, perforation, damage to the esophagus, lungs, and other structures, pulmonary vein stenosis, worsening renal function, and death. The patient understands these risk and wishes to proceed.  We will therefore proceed with catheter ablation at the next available time.  Will plan cardiac CT prior to ablation.  Importance of compliance with xarelto discussed with the patient.  2. OSA Compliance with CPAP advised  3. Obesity Body mass index is 34.44 kg/m. Lifestyle modification encouraged today    Labs/ tests ordered today include:  Orders Placed This Encounter  Procedures  . EKG 12-Lead     Signed, Thompson Grayer, MD  07/13/2017 3:04 PM     [place] [address] [phone] (office) [phone] (fax)

## 2017-07-14 LAB — CBC WITH DIFFERENTIAL/PLATELET
BASOS: 0 %
Basophils Absolute: 0 10*3/uL (ref 0.0–0.2)
EOS (ABSOLUTE): 0.3 10*3/uL (ref 0.0–0.4)
Eos: 4 %
HEMOGLOBIN: 10 g/dL — AB (ref 13.0–17.7)
Hematocrit: 32.1 % — ABNORMAL LOW (ref 37.5–51.0)
IMMATURE GRANS (ABS): 0 10*3/uL (ref 0.0–0.1)
Immature Granulocytes: 0 %
LYMPHS ABS: 1.3 10*3/uL (ref 0.7–3.1)
LYMPHS: 19 %
MCH: 27.6 pg (ref 26.6–33.0)
MCHC: 31.2 g/dL — AB (ref 31.5–35.7)
MCV: 89 fL (ref 79–97)
Monocytes Absolute: 0.8 10*3/uL (ref 0.1–0.9)
Monocytes: 12 %
Neutrophils Absolute: 4.4 10*3/uL (ref 1.4–7.0)
Neutrophils: 65 %
PLATELETS: 309 10*3/uL (ref 150–379)
RBC: 3.62 x10E6/uL — ABNORMAL LOW (ref 4.14–5.80)
RDW: 14.5 % (ref 12.3–15.4)
WBC: 6.8 10*3/uL (ref 3.4–10.8)

## 2017-07-14 LAB — BASIC METABOLIC PANEL
BUN / CREAT RATIO: 14 (ref 10–24)
BUN: 22 mg/dL (ref 8–27)
CALCIUM: 8.9 mg/dL (ref 8.6–10.2)
CO2: 21 mmol/L (ref 20–29)
Chloride: 104 mmol/L (ref 96–106)
Creatinine, Ser: 1.59 mg/dL — ABNORMAL HIGH (ref 0.76–1.27)
GFR, EST AFRICAN AMERICAN: 49 mL/min/{1.73_m2} — AB (ref >59)
GFR, EST NON AFRICAN AMERICAN: 42 mL/min/{1.73_m2} — AB (ref >59)
Glucose: 93 mg/dL (ref 65–99)
POTASSIUM: 5.1 mmol/L (ref 3.5–5.2)
Sodium: 145 mmol/L — ABNORMAL HIGH (ref 134–144)

## 2017-07-20 ENCOUNTER — Ambulatory Visit (INDEPENDENT_AMBULATORY_CARE_PROVIDER_SITE_OTHER): Payer: Medicare Other | Admitting: Internal Medicine

## 2017-07-20 ENCOUNTER — Telehealth: Payer: Self-pay | Admitting: Internal Medicine

## 2017-07-20 ENCOUNTER — Encounter: Payer: Self-pay | Admitting: Internal Medicine

## 2017-07-20 VITALS — BP 122/76 | HR 86 | Ht 70.0 in | Wt 244.0 lb

## 2017-07-20 DIAGNOSIS — I4891 Unspecified atrial fibrillation: Secondary | ICD-10-CM | POA: Diagnosis not present

## 2017-07-20 DIAGNOSIS — I6521 Occlusion and stenosis of right carotid artery: Secondary | ICD-10-CM | POA: Diagnosis not present

## 2017-07-20 DIAGNOSIS — G4733 Obstructive sleep apnea (adult) (pediatric): Secondary | ICD-10-CM

## 2017-07-20 NOTE — Patient Instructions (Signed)
Order- DME [name])- Please replace old CPAP machine, change to auto 5-20, mask of choice, humidifier, supplies, AirView      Dx OSA  Please cal as needed

## 2017-07-20 NOTE — Progress Notes (Signed)
Subjective:    Patient ID: Caleb Wolf, male    DOB: 1941-04-25, 77 y.o.   MRN: [id number]  HPI   Male former smoker followed for OSA, complicated by HBP, CAD/MI/CABG, carotid stenosis, osteoarthritis hip, GERD NPSG 07/2012  AHI 15/ hr, CPAP to 10 ---------------------------------------------------.  05/05/2016-77 year old male former smoker followed for OSA, complicated by HBP, CAD/MI/CABG/, carotid stenosis, osteoarthritis hip, GERD CPAP 11/[name] FOLLOWS FOR: DME:[facility] store. DL attached. No new supplies needed at this time. Pt would like to have order placed for new CPAP machine-believes he has had this CPAP for over 5-7 years. Pt wears CPAP nightly Feeling much better on CPAP. Mask occasionally bothersome-full face. Download-good compliance 83%/4 hours, AHI 9.0 with central apneas. CXR 12/12/2015 IMPRESSION: No infiltrate or pulmonary edema. Small right pleural effusion with blunting of right costophrenic angle.  07/20/17-  77 year old male former smoker followed for OSA, complicated by HBP, CAD/MI/CABG/AFib, carotid stenosis, osteoarthritis hip, GERD CPAP auto 5-15/[name] ----OSA; DME: [facility]. Pt wears CPAP nightly. DL attached. Pt should be due for new CPAP.  Xarelto, amiodarone Sleeps much better with CPAP and cannot get by without it. Download 98% compliance, AHI 12.8/hour.  Has SoClean machine.  ROS-see HPI + = positive Constitutional:    weight loss, night sweats, fevers, chills, fatigue, lassitude. HEENT:    headaches, difficulty swallowing, tooth/dental problems, sore throat,       sneezing, itching, ear ache, nasal congestion, post nasal drip, snoring CV:    chest pain, orthopnea, PND, swelling in lower extremities, anasarca,                                                 dizziness, palpitations Resp:   shortness of breath with exertion or at rest.                productive cough,   non-productive cough, coughing up of blood.    change in color of mucus.  wheezing.   Skin:    rash or lesions. GI:  No-   heartburn, indigestion, abdominal pain, nausea, vomiting, diarrhea,                 change in bowel habits, loss of appetite GU: dysuria, change in color of urine, no urgency or frequency.   flank pain. MS:   joint pain, stiffness, decreased range of motion, back pain. Neuro-     nothing unusual Psych:  change in mood or affect.  depression or anxiety.   memory loss.    Objective:  OBJ- Physical Exam General- Alert, Oriented, Affect-appropriate, Distress- none acute, + overweight Skin- rash-none, lesions- none, excoriation- none Lymphadenopathy- none Head- atraumatic            Eyes- Gross vision intact, PERRLA, conjunctivae and secretions clear            Ears- Hearing, canals-normal            Nose- Clear, no-Septal dev, mucus, polyps, erosion, perforation             Throat- Mallampati III-IV, mucosa clear , drainage- none, tonsils- atrophic Neck- flexible , trachea midline, no stridor , thyroid nl, carotid no bruit Chest - symmetrical excursion , unlabored           Heart/CV- IRR+ , no murmur , no gallop  , no rub, nl s1 s2                           -  JVD- none , edema- none, stasis changes- none, varices- none           Lung- clear to P&A, wheeze- none, cough- none , dullness-none, rub- none           Chest wall-  Abd-  Br/ Gen/ Rectal- Not done, not indicated Extrem- cyanosis- none, clubbing, none, atrophy- none, strength- nl Neuro- grossly intact to observation    Assessment & Plan:

## 2017-07-20 NOTE — Telephone Encounter (Signed)
Sending to [facility] for [facility]

## 2017-07-22 ENCOUNTER — Telehealth: Payer: Self-pay | Admitting: Cardiovascular Disease

## 2017-07-22 MED ORDER — RIVAROXABAN 20 MG PO TABS: 20.0000 mg | ORAL_TABLET | Freq: Every day | ORAL | 3 refills | Status: DC

## 2017-07-22 NOTE — Telephone Encounter (Signed)
Refill on Xarelto sent to [facility] / tg

## 2017-07-23 ENCOUNTER — Ambulatory Visit (HOSPITAL_COMMUNITY)
Admission: RE | Admit: 2017-07-23 | Discharge: 2017-07-23 | Disposition: A | Discharge status: Home / Self Care | Payer: Medicare Other | Source: Ambulatory Visit | Attending: Internal Medicine | Admitting: Internal Medicine

## 2017-07-23 ENCOUNTER — Ambulatory Visit (HOSPITAL_COMMUNITY): Payer: Medicare Other

## 2017-07-23 ENCOUNTER — Other Ambulatory Visit: Payer: Self-pay

## 2017-07-23 DIAGNOSIS — I481 Persistent atrial fibrillation: Secondary | ICD-10-CM | POA: Diagnosis not present

## 2017-07-23 DIAGNOSIS — J90 Pleural effusion, not elsewhere classified: Secondary | ICD-10-CM | POA: Insufficient documentation

## 2017-07-23 DIAGNOSIS — J9811 Atelectasis: Secondary | ICD-10-CM | POA: Diagnosis not present

## 2017-07-23 DIAGNOSIS — R918 Other nonspecific abnormal finding of lung field: Secondary | ICD-10-CM | POA: Diagnosis not present

## 2017-07-23 DIAGNOSIS — R590 Localized enlarged lymph nodes: Secondary | ICD-10-CM | POA: Diagnosis not present

## 2017-07-23 DIAGNOSIS — I4819 Other persistent atrial fibrillation: Secondary | ICD-10-CM

## 2017-07-23 MED ORDER — RIVAROXABAN 20 MG PO TABS: 20.0000 mg | ORAL_TABLET | Freq: Every day | ORAL | 11 refills | Status: DC

## 2017-07-23 MED ORDER — METOPROLOL TARTRATE 5 MG/5ML IV SOLN
5.0000 mg | INTRAVENOUS | Status: DC | PRN
  Administered 2017-07-23 (×2): 5 mg via INTRAVENOUS
  Filled 2017-07-23 (×3): qty 5

## 2017-07-23 MED ORDER — METOPROLOL TARTRATE 5 MG/5ML IV SOLN
INTRAVENOUS | Status: AC
  Administered 2017-07-23: 5 mg via INTRAVENOUS
  Filled 2017-07-23: qty 10

## 2017-07-23 MED ORDER — IOPAMIDOL (ISOVUE-370) INJECTION 76%
INTRAVENOUS | Status: AC
  Administered 2017-07-23: 100 mL
  Filled 2017-07-23: qty 100

## 2017-07-23 NOTE — Telephone Encounter (Signed)
-

## 2017-07-25 NOTE — Assessment & Plan Note (Signed)
Pretty good ventricular response rate at this visit.  Managed by cardiology.

## 2017-07-25 NOTE — Assessment & Plan Note (Signed)
Appropriate to replace his old CPAP machine as discussed.  We would like better control and are changing AutoPap range to 5-20

## 2017-07-28 ENCOUNTER — Encounter (HOSPITAL_COMMUNITY): Payer: Self-pay | Admitting: Certified Registered Nurse Anesthetist

## 2017-07-28 ENCOUNTER — Ambulatory Visit (HOSPITAL_COMMUNITY): Payer: Medicare Other | Admitting: Anesthesiology

## 2017-07-28 ENCOUNTER — Encounter (HOSPITAL_COMMUNITY)
Admission: RE | Disposition: A | Discharge status: Home / Self Care | Payer: Self-pay | Source: Ambulatory Visit | Attending: Internal Medicine

## 2017-07-28 ENCOUNTER — Ambulatory Visit (HOSPITAL_COMMUNITY)
Admission: RE | Admit: 2017-07-28 | Discharge: 2017-07-29 | Disposition: A | Discharge status: Home / Self Care | Payer: Medicare Other | Source: Ambulatory Visit | Attending: Internal Medicine | Admitting: Internal Medicine

## 2017-07-28 ENCOUNTER — Other Ambulatory Visit: Payer: Self-pay

## 2017-07-28 DIAGNOSIS — I110 Hypertensive heart disease with heart failure: Secondary | ICD-10-CM | POA: Diagnosis not present

## 2017-07-28 DIAGNOSIS — I4891 Unspecified atrial fibrillation: Secondary | ICD-10-CM | POA: Diagnosis not present

## 2017-07-28 DIAGNOSIS — Z6834 Body mass index (BMI) 34.0-34.9, adult: Secondary | ICD-10-CM | POA: Diagnosis not present

## 2017-07-28 DIAGNOSIS — I5030 Unspecified diastolic (congestive) heart failure: Secondary | ICD-10-CM | POA: Diagnosis not present

## 2017-07-28 DIAGNOSIS — E785 Hyperlipidemia, unspecified: Secondary | ICD-10-CM | POA: Diagnosis not present

## 2017-07-28 DIAGNOSIS — I252 Old myocardial infarction: Secondary | ICD-10-CM | POA: Diagnosis not present

## 2017-07-28 DIAGNOSIS — K219 Gastro-esophageal reflux disease without esophagitis: Secondary | ICD-10-CM | POA: Diagnosis not present

## 2017-07-28 DIAGNOSIS — G8929 Other chronic pain: Secondary | ICD-10-CM | POA: Diagnosis not present

## 2017-07-28 DIAGNOSIS — I483 Typical atrial flutter: Secondary | ICD-10-CM | POA: Insufficient documentation

## 2017-07-28 DIAGNOSIS — I739 Peripheral vascular disease, unspecified: Secondary | ICD-10-CM | POA: Diagnosis not present

## 2017-07-28 DIAGNOSIS — E669 Obesity, unspecified: Secondary | ICD-10-CM | POA: Insufficient documentation

## 2017-07-28 DIAGNOSIS — Z951 Presence of aortocoronary bypass graft: Secondary | ICD-10-CM | POA: Insufficient documentation

## 2017-07-28 DIAGNOSIS — M109 Gout, unspecified: Secondary | ICD-10-CM | POA: Insufficient documentation

## 2017-07-28 DIAGNOSIS — G4733 Obstructive sleep apnea (adult) (pediatric): Secondary | ICD-10-CM | POA: Diagnosis not present

## 2017-07-28 DIAGNOSIS — M1990 Unspecified osteoarthritis, unspecified site: Secondary | ICD-10-CM | POA: Diagnosis not present

## 2017-07-28 DIAGNOSIS — Z96641 Presence of right artificial hip joint: Secondary | ICD-10-CM | POA: Insufficient documentation

## 2017-07-28 DIAGNOSIS — Z87891 Personal history of nicotine dependence: Secondary | ICD-10-CM | POA: Diagnosis not present

## 2017-07-28 DIAGNOSIS — M549 Dorsalgia, unspecified: Secondary | ICD-10-CM | POA: Diagnosis not present

## 2017-07-28 DIAGNOSIS — Z7901 Long term (current) use of anticoagulants: Secondary | ICD-10-CM | POA: Insufficient documentation

## 2017-07-28 DIAGNOSIS — I481 Persistent atrial fibrillation: Secondary | ICD-10-CM | POA: Insufficient documentation

## 2017-07-28 DIAGNOSIS — I4819 Other persistent atrial fibrillation: Secondary | ICD-10-CM | POA: Diagnosis present

## 2017-07-28 DIAGNOSIS — Z85828 Personal history of other malignant neoplasm of skin: Secondary | ICD-10-CM | POA: Insufficient documentation

## 2017-07-28 DIAGNOSIS — I2510 Atherosclerotic heart disease of native coronary artery without angina pectoris: Secondary | ICD-10-CM | POA: Insufficient documentation

## 2017-07-28 DIAGNOSIS — I10 Essential (primary) hypertension: Secondary | ICD-10-CM | POA: Insufficient documentation

## 2017-07-28 DIAGNOSIS — I081 Rheumatic disorders of both mitral and tricuspid valves: Secondary | ICD-10-CM | POA: Diagnosis not present

## 2017-07-28 DIAGNOSIS — Z79899 Other long term (current) drug therapy: Secondary | ICD-10-CM | POA: Insufficient documentation

## 2017-07-28 HISTORY — DX: Other persistent atrial fibrillation: I48.19

## 2017-07-28 HISTORY — PX: ABLATION OF DYSRHYTHMIC FOCUS: SHX254

## 2017-07-28 HISTORY — PX: ATRIAL FIBRILLATION ABLATION: EP1191

## 2017-07-28 LAB — POCT ACTIVATED CLOTTING TIME
ACTIVATED CLOTTING TIME: 241 s
ACTIVATED CLOTTING TIME: 268 s
ACTIVATED CLOTTING TIME: 334 s
Activated Clotting Time: 208 seconds
Activated Clotting Time: 197 seconds
Activated Clotting Time: 191 seconds

## 2017-07-28 SURGERY — ATRIAL FIBRILLATION ABLATION
Anesthesia: General

## 2017-07-28 MED ORDER — DOXAZOSIN MESYLATE 4 MG PO TABS
4.0000 mg | ORAL_TABLET | Freq: Every day | ORAL | Status: DC
  Administered 2017-07-29: 4 mg via ORAL
  Filled 2017-07-28: qty 1

## 2017-07-28 MED ORDER — ONDANSETRON HCL 4 MG/2ML IJ SOLN
INTRAMUSCULAR | Status: DC | PRN
  Administered 2017-07-28: 4 mg via INTRAVENOUS

## 2017-07-28 MED ORDER — BUPIVACAINE HCL (PF) 0.25 % IJ SOLN
INTRAMUSCULAR | Status: AC
  Filled 2017-07-28: qty 30

## 2017-07-28 MED ORDER — BUPIVACAINE HCL (PF) 0.25 % IJ SOLN
INTRAMUSCULAR | Status: DC | PRN
  Administered 2017-07-28: 20 mL

## 2017-07-28 MED ORDER — FINASTERIDE 5 MG PO TABS
5.0000 mg | ORAL_TABLET | Freq: Every evening | ORAL | Status: DC
  Administered 2017-07-28: 5 mg via ORAL
  Filled 2017-07-28: qty 1

## 2017-07-28 MED ORDER — FENTANYL CITRATE (PF) 100 MCG/2ML IJ SOLN
INTRAMUSCULAR | Status: DC | PRN
  Administered 2017-07-28 (×2): 100 ug via INTRAVENOUS

## 2017-07-28 MED ORDER — HYDROCODONE-ACETAMINOPHEN 5-325 MG PO TABS: 1.0000 | ORAL_TABLET | ORAL | Status: DC | PRN

## 2017-07-28 MED ORDER — IOPAMIDOL (ISOVUE-370) INJECTION 76%
INTRAVENOUS | Status: AC
  Filled 2017-07-28: qty 50

## 2017-07-28 MED ORDER — LIDOCAINE HCL (CARDIAC) 20 MG/ML IV SOLN
INTRAVENOUS | Status: DC | PRN
  Administered 2017-07-28: 60 mg via INTRAVENOUS

## 2017-07-28 MED ORDER — PROTAMINE SULFATE 10 MG/ML IV SOLN
INTRAVENOUS | Status: DC | PRN
  Administered 2017-07-28: 20 mg via INTRAVENOUS
  Administered 2017-07-28: 10 mg via INTRAVENOUS

## 2017-07-28 MED ORDER — SODIUM CHLORIDE 0.9% FLUSH: 3.0000 mL | INTRAVENOUS | Status: DC | PRN

## 2017-07-28 MED ORDER — HEPARIN SODIUM (PORCINE) 1000 UNIT/ML IJ SOLN
INTRAMUSCULAR | Status: AC
  Filled 2017-07-28: qty 1

## 2017-07-28 MED ORDER — CYCLOBENZAPRINE HCL 10 MG PO TABS: 10.0000 mg | ORAL_TABLET | Freq: Three times a day (TID) | ORAL | Status: DC | PRN

## 2017-07-28 MED ORDER — HEPARIN SODIUM (PORCINE) 1000 UNIT/ML IJ SOLN
INTRAMUSCULAR | Status: DC | PRN
  Administered 2017-07-28: 5000 [IU] via INTRAVENOUS
  Administered 2017-07-28: 4000 [IU] via INTRAVENOUS
  Administered 2017-07-28: 2000 [IU] via INTRAVENOUS

## 2017-07-28 MED ORDER — ONDANSETRON HCL 4 MG/2ML IJ SOLN: 4.0000 mg | Freq: Once | INTRAMUSCULAR | Status: DC | PRN

## 2017-07-28 MED ORDER — IOPAMIDOL (ISOVUE-370) INJECTION 76%
INTRAVENOUS | Status: DC | PRN
Start: 2017-07-28 — End: 2017-07-28
  Administered 2017-07-28: 2 mL

## 2017-07-28 MED ORDER — ONDANSETRON HCL 4 MG/2ML IJ SOLN: 4.0000 mg | Freq: Four times a day (QID) | INTRAMUSCULAR | Status: DC | PRN

## 2017-07-28 MED ORDER — PHENYLEPHRINE HCL 10 MG/ML IJ SOLN
INTRAVENOUS | Status: DC | PRN
  Administered 2017-07-28: 30 ug/min via INTRAVENOUS

## 2017-07-28 MED ORDER — PANTOPRAZOLE SODIUM 40 MG PO TBEC
40.0000 mg | DELAYED_RELEASE_TABLET | Freq: Every day | ORAL | Status: DC
Start: 2017-07-29 — End: 2017-07-29
  Administered 2017-07-29: 40 mg via ORAL
  Filled 2017-07-28: qty 1

## 2017-07-28 MED ORDER — ACETAMINOPHEN 325 MG PO TABS
650.0000 mg | ORAL_TABLET | ORAL | Status: DC | PRN
  Administered 2017-07-28: 650 mg via ORAL
  Filled 2017-07-28: qty 2

## 2017-07-28 MED ORDER — SODIUM CHLORIDE 0.9 % IV SOLN
INTRAVENOUS | Status: DC
  Administered 2017-07-28: 10:00:00 via INTRAVENOUS

## 2017-07-28 MED ORDER — DEXAMETHASONE SODIUM PHOSPHATE 10 MG/ML IJ SOLN
INTRAMUSCULAR | Status: DC | PRN
  Administered 2017-07-28: 5 mg via INTRAVENOUS

## 2017-07-28 MED ORDER — HEPARIN SODIUM (PORCINE) 1000 UNIT/ML IJ SOLN
INTRAMUSCULAR | Status: DC | PRN
  Administered 2017-07-28 (×2): 1000 [IU] via INTRAVENOUS
  Administered 2017-07-28: 12000 [IU] via INTRAVENOUS

## 2017-07-28 MED ORDER — PROPOFOL 10 MG/ML IV BOLUS
INTRAVENOUS | Status: DC | PRN
  Administered 2017-07-28: 160 mg via INTRAVENOUS
  Administered 2017-07-28: 40 mg via INTRAVENOUS

## 2017-07-28 MED ORDER — FENTANYL CITRATE (PF) 100 MCG/2ML IJ SOLN: 25.0000 ug | INTRAMUSCULAR | Status: DC | PRN

## 2017-07-28 MED ORDER — SUGAMMADEX SODIUM 200 MG/2ML IV SOLN
INTRAVENOUS | Status: DC | PRN
  Administered 2017-07-28: 200 mg via INTRAVENOUS

## 2017-07-28 MED ORDER — RIVAROXABAN 20 MG PO TABS
20.0000 mg | ORAL_TABLET | ORAL | Status: DC
  Administered 2017-07-29: 20 mg via ORAL
  Filled 2017-07-28: qty 1

## 2017-07-28 MED ORDER — FUROSEMIDE 40 MG PO TABS
40.0000 mg | ORAL_TABLET | Freq: Every day | ORAL | Status: DC
  Administered 2017-07-29: 40 mg via ORAL
  Filled 2017-07-28: qty 1

## 2017-07-28 MED ORDER — HEPARIN (PORCINE) IN NACL 2-0.9 UNIT/ML-% IJ SOLN
INTRAMUSCULAR | Status: AC
  Filled 2017-07-28: qty 500

## 2017-07-28 MED ORDER — SODIUM CHLORIDE 0.9 % IV SOLN: 250.0000 mL | INTRAVENOUS | Status: DC | PRN

## 2017-07-28 MED ORDER — PHENYLEPHRINE HCL 10 MG/ML IJ SOLN
INTRAMUSCULAR | Status: DC | PRN
  Administered 2017-07-28: 80 ug via INTRAVENOUS
  Administered 2017-07-28: 40 ug via INTRAVENOUS

## 2017-07-28 MED ORDER — LISINOPRIL 40 MG PO TABS
40.0000 mg | ORAL_TABLET | Freq: Every day | ORAL | Status: DC
  Administered 2017-07-29: 40 mg via ORAL
  Filled 2017-07-28: qty 1

## 2017-07-28 MED ORDER — ROCURONIUM BROMIDE 100 MG/10ML IV SOLN
INTRAVENOUS | Status: DC | PRN
  Administered 2017-07-28: 20 mg via INTRAVENOUS
  Administered 2017-07-28: 40 mg via INTRAVENOUS

## 2017-07-28 MED ORDER — SODIUM CHLORIDE 0.9% FLUSH
3.0000 mL | Freq: Two times a day (BID) | INTRAVENOUS | Status: DC
  Administered 2017-07-29: 3 mL via INTRAVENOUS

## 2017-07-28 MED ORDER — HEPARIN (PORCINE) IN NACL 2-0.9 UNIT/ML-% IJ SOLN
INTRAMUSCULAR | Status: AC | PRN
  Administered 2017-07-28: 500 mL

## 2017-07-28 SURGICAL SUPPLY — 17 items
BLANKET WARM UNDERBOD FULL ACC (MISCELLANEOUS) ×3 IMPLANT
CATH MAPPNG PENTARAY F 2-6-2MM (CATHETERS) ×1 IMPLANT
CATH NAVISTAR SMARTTOUCH DF (ABLATOR) ×3 IMPLANT
CATH SOUNDSTAR 3D IMAGING (CATHETERS) ×3 IMPLANT
CATH WEBSTER BI DIR CS D-F CRV (CATHETERS) ×3 IMPLANT
COVER SWIFTLINK CONNECTOR (BAG) ×3 IMPLANT
NEEDLE TRANSEP BRK 71CM 407200 (NEEDLE) ×3 IMPLANT
PACK EP LATEX FREE (CUSTOM PROCEDURE TRAY) ×2
PACK EP LF (CUSTOM PROCEDURE TRAY) ×1 IMPLANT
PAD DEFIB LIFELINK (PAD) ×3 IMPLANT
PATCH CARTO3 (PAD) ×3 IMPLANT
PENTARAY F 2-6-2MM (CATHETERS) ×3
SHEATH AVANTI 11F 11CM (SHEATH) ×3 IMPLANT
SHEATH PINNACLE 7F 10CM (SHEATH) ×6 IMPLANT
SHEATH PINNACLE 9F 10CM (SHEATH) ×3 IMPLANT
SHEATH SWARTZ TS SL2 63CM 8.5F (SHEATH) ×3 IMPLANT
TUBING SMART ABLATE COOLFLOW (TUBING) ×3 IMPLANT

## 2017-07-28 NOTE — Anesthesia Postprocedure Evaluation (Signed)
Anesthesia Post Note  Patient: [name]  Procedure(s) Performed: ATRIAL FIBRILLATION ABLATION (N/A )     Patient location during evaluation: PACU Anesthesia Type: General Level of consciousness: awake and alert Pain management: pain level controlled Vital Signs Assessment: post-procedure vital signs reviewed and stable Respiratory status: spontaneous breathing, nonlabored ventilation, respiratory function stable and patient connected to nasal cannula oxygen Cardiovascular status: blood pressure returned to baseline and stable Postop Assessment: no apparent nausea or vomiting Anesthetic complications: no    Last Vitals:  Vitals:   07/28/17 1440 07/28/17 1501  BP: 133/62   Pulse: 67   Resp: 11   Temp:  36.5 C  SpO2: 93%     Last Pain:  Vitals:   07/28/17 1501  TempSrc: Temporal                 Audry Pili

## 2017-07-28 NOTE — Anesthesia Procedure Notes (Signed)
Procedure Name: Intubation Date/Time: 07/28/2017 11:10 AM Performed by: White, Amedeo Plenty, CRNA Pre-anesthesia Checklist: Patient identified, Emergency Drugs available, Suction available and Patient being monitored Patient Re-evaluated:Patient Re-evaluated prior to induction Oxygen Delivery Method: Circle System Utilized Preoxygenation: Pre-oxygenation with 100% oxygen Induction Type: IV induction Ventilation: Mask ventilation without difficulty Laryngoscope Size: Mac and 4 Grade View: Grade I Tube type: Oral Tube size: 7.5 mm Number of attempts: 1 Airway Equipment and Method: Stylet and Oral airway Placement Confirmation: ETT inserted through vocal cords under direct vision,  positive ETCO2 and breath sounds checked- equal and bilateral Secured at: 23 cm Tube secured with: Tape Dental Injury: Teeth and Oropharynx as per pre-operative assessment

## 2017-07-28 NOTE — Discharge Summary (Signed)
ELECTROPHYSIOLOGY PROCEDURE DISCHARGE SUMMARY    Patient ID: Caleb Wolf,  MRN: [id number], DOB/AGE: 08/22/1940 77 y.o.  Admit date: 07/28/2017 Discharge date: 07/29/2017  Primary Care Physician: Asencion Noble, MD Primary Cardiologist: Bronson Ing Electrophysiologist: Thompson Grayer, MD  Primary Discharge Diagnosis:  Persistent atrial fibrillation and atrial flutter s/p ablation this admission  Secondary Discharge Diagnosis:  1.  GERD 2.  HTN 3.  OSA  Procedures This Admission:  1.  Electrophysiology study and radiofrequency catheter ablation on 07/28/17 by Dr Thompson Grayer.  This study demonstrated  atrial fibrillation upon presentation; intracardiac echo reveals a large sized left atrium; successful electrical isolation and anatomical encircling of all four pulmonary veins with radiofrequency current; additional mapping and ablation within the left atrium due to persistence of atrial fibrillation with a posterior wall box demonstrated; atrial fibrillation successfully cardioverted to sinus rhythm; cavo-tricuspid isthmus ablation performed with complete bidirectional isthmus block achieved; no inducible arrhythmias following ablation; no early apparent complications.  Brief HPI: Caleb Wolf is a 77 y.o. male with a history of persistent atrial fibrillation.  They have failed medical therapy with amiodarone. Risks, benefits, and alternatives to catheter ablation of atrial fibrillation were reviewed with the patient who wished to proceed.  The patient underwent cardiac CT prior to the procedure which demonstrated no LAA thrombus.    Hospital Course:  The patient was admitted and underwent EPS/RFCA of atrial fibrillation with details as outlined above.  They were monitored on telemetry overnight which demonstrated sinus rhythm.  Groin was without complication on the day of discharge.  The patient was examined and considered to be stable for discharge.  Wound care and restrictions were reviewed  with the patient.  The patient will be seen back by Roderic Palau, NP in 4 weeks and Dr Rayann Heman in 12 weeks for post ablation follow up.   This patients CHA2DS2-VASc Score and unadjusted Ischemic Stroke Rate (% per year) is equal to 3.2 % stroke rate/year from a score of 3 Above score calculated as 1 point each if present [CHF, HTN, DM, Vascular=MI/PAD/Aortic Plaque, Age if 65-74, or Male] Above score calculated as 2 points each if present [Age > 75, or Stroke/TIA/TE]   Physical Exam: Vitals:   07/28/17 2139 07/28/17 2350 07/29/17 0100 07/29/17 0408  BP: 123/73   (!) 130/58  Pulse: 68   60  Resp: 18   18  Temp: 97.7 F (36.5 C)   97.9 F (36.6 C)  TempSrc: Oral     SpO2: 96% 98% 98% 98%  Weight:    239 lb 6.7 oz (108.6 kg)  Height:        GEN- The patient is well appearing, alert and oriented x 3 today.   HEENT: normocephalic, atraumatic; sclera clear, conjunctiva pink; hearing intact; oropharynx clear; neck supple  Lungs- Clear to ausculation bilaterally, normal work of breathing.  No wheezes, rales, rhonchi Heart- Regular rate and rhythm, no murmurs, rubs or gallops  GI- soft, non-tender, non-distended, bowel sounds present  Extremities- no clubbing, cyanosis, or edema; DP/PT/radial pulses 2+ bilaterally, groin without hematoma/bruit MS- no significant deformity or atrophy Skin- warm and dry, no rash or lesion Psych- euthymic mood, full affect Neuro- strength and sensation are intact   Labs:   Lab Results  Component Value Date   WBC 6.8 07/13/2017   HGB 10.0 (L) 07/13/2017   HCT 32.1 (L) 07/13/2017   MCV 89 07/13/2017   PLT 309 07/13/2017   No results for input(s): NA, K, CL,  CO2, BUN, CREATININE, CALCIUM, PROT, BILITOT, ALKPHOS, ALT, AST, GLUCOSE in the last 168 hours.  Invalid input(s): LABALBU   Discharge Medications:  Allergies as of 07/29/2017      Reactions   Codeine Sulfate Nausea Only      Medication List    TAKE these medications   amiodarone 200 MG  tablet Commonly known as:  PACERONE Take 1 tablet (200 mg total) by mouth daily.   atorvastatin 40 MG tablet Commonly known as:  LIPITOR TAKE 1 TABLET BY MOUTH IN THE EVENING What changed:    how much to take  how to take this  when to take this   colchicine 0.6 MG tablet Take 0.6 mg by mouth daily as needed (for gout).   cyclobenzaprine 10 MG tablet Commonly known as:  FLEXERIL Take 10 mg by mouth at bedtime as needed for muscle spasms   doxazosin 4 MG tablet Commonly known as:  CARDURA Take 4 mg by mouth daily.   finasteride 5 MG tablet Commonly known as:  PROSCAR Take 5 mg by mouth every evening.   furosemide 40 MG tablet Commonly known as:  LASIX TAKE 1 TABLET BY MOUTH TWICE A WEEK AS DIRECTED What changed:  See the new instructions.   lisinopril 40 MG tablet Commonly known as:  PRINIVIL,ZESTRIL Take 40 mg by mouth daily.   metoprolol tartrate 25 MG tablet Commonly known as:  LOPRESSOR Take 1 tablet (25 mg total) by mouth 2 (two) times daily.   nitroGLYCERIN 0.4 MG SL tablet Commonly known as:  NITROSTAT PLACE 1 TABLET UNDER THE TONGUE EVERY 5 MINUTES AS NEEDED FOR CHEST PAIN What changed:    how much to take  how to take this  when to take this  reasons to take this  additional instructions   pantoprazole 40 MG tablet Commonly known as:  PROTONIX Take 1 tablet (40 mg total) by mouth daily before breakfast. What changed:  when to take this   potassium chloride SA 20 MEQ tablet Commonly known as:  K-DUR,KLOR-CON Take 1 tablet (20 mEq total) by mouth as directed. 2 times per wk. What changed:    when to take this  additional instructions   rivaroxaban 20 MG Tabs tablet Commonly known as:  XARELTO Take 1 tablet (20 mg total) by mouth daily.       Disposition:   Follow-up Information    [facility] Follow up on 08/20/2017.   Specialty:  Cardiology Why:  at 2:30PM  Contact information: [address] [id number] [place] [address] [phone]       Thompson Grayer, MD Follow up on 10/28/2017.   Specialty:  Cardiology Why:  at [facility] information: [address] [place] [address] [phone]           Duration of Discharge Encounter: Greater than 30 minutes including physician time.  Signed, Chanetta Marshall, NP 07/29/2017 7:53 AM   I have seen, examined the patient, and reviewed the above assessment and plan.  Changes to above are made where necessary.  On exam, RRR.  Doing well s/p ablation DC to home Continue current medicines.  Could stop amiodarone at 4 week follow-up if AF remains controlled.  Co Sign: Thompson Grayer, MD 07/29/2017 7:53 AM

## 2017-07-28 NOTE — Interval H&P Note (Signed)
History and Physical Interval Note:  07/28/2017 10:52 AM  Caleb Wolf  has presented today for surgery, with the diagnosis of afib  The various methods of treatment have been discussed with the patient and family. After consideration of risks, benefits and other options for treatment, the patient has consented to  Procedure(s): ATRIAL FIBRILLATION ABLATION (N/A) as a surgical intervention .  The patient's history has been reviewed, patient examined, no change in status, stable for surgery.  I have reviewed the patient's chart and labs.  Questions were answered to the patient's satisfaction.     Thompson Grayer

## 2017-07-28 NOTE — Discharge Instructions (Signed)
No driving for 4 days. No lifting over 5 lbs for 1 week. No sexual activity for 1 week. You may return to work in 1 week. Keep procedure site clean & dry. If you notice increased pain, swelling, bleeding or pus, call/return!  You may shower, but no soaking baths/hot tubs/pools for 1 week.  ° ° °You have an appointment set up with the Atrial Fibrillation Clinic.  Multiple studies have shown that being followed by a dedicated atrial fibrillation clinic in addition to the standard care you receive from your other physicians improves health. We believe that enrollment in the atrial fibrillation clinic will allow us to better care for you.  ° °The phone number to the Atrial Fibrillation Clinic is [phone]. The clinic is staffed Monday through Friday from 8:30am to 5pm. ° °Parking Directions: The clinic is located in the Heart and Vascular Building connected to [facility]. °1)From [address] turn on to [address] and go to the 3rd entrance  (Heart and Vascular entrance) on the right. °2)Look to the right for Heart &Vascular Parking Garage. °3)A code for the entrance is required please call the clinic to receive this.   °4)Take the elevators to the 1st floor. Registration is in the room with the glass walls at the end of the hallway. ° °If you have any trouble parking or locating the clinic, please don’t hesitate to call [phone]. ° ° °

## 2017-07-28 NOTE — Progress Notes (Addendum)
Site area: RFV X 3 Site Prior to Removal:  Level 0 Pressure Applied For:25 min Manual:   yes Patient Status During Pull:  stable Post Pull Site:  Level 0 Post Pull Instructions Given: yes  Post Pull Pulses Present: palpable Dressing Applied: tegaderm  Bedrest begins @ 6438 till 2220 Comments:

## 2017-07-28 NOTE — Transfer of Care (Signed)
Immediate Anesthesia Transfer of Care Note  Patient: Caleb Wolf Drudge  Procedure(s) Performed: ATRIAL FIBRILLATION ABLATION (N/A )  Patient Location: Cath Lab  Anesthesia Type:General  Level of Consciousness: awake, alert  and patient cooperative  Airway & Oxygen Therapy: Patient Spontanous Breathing and Patient connected to nasal cannula oxygen  Post-op Assessment: Report given to RN and Post -op Vital signs reviewed and stable  Post vital signs: Reviewed and stable  Last Vitals:  Vitals:   07/28/17 0937  BP: (!) 142/79  Pulse: 98  Resp: 18  Temp: 36.8 C  SpO2: 100%    Last Pain:  Vitals:   07/28/17 0937  TempSrc: Oral         Complications: No apparent anesthesia complications

## 2017-07-28 NOTE — Anesthesia Preprocedure Evaluation (Addendum)
Anesthesia Evaluation  Patient identified by MRN, date of birth, ID band Patient awake    Reviewed: Allergy & Precautions, NPO status , Patient's Chart, lab work & pertinent test results, reviewed documented beta blocker date and time   Airway Mallampati: II  TM Distance: >3 FB Neck ROM: Full    Dental  (+) Teeth Intact, Dental Advisory Given   Pulmonary sleep apnea and Continuous Positive Airway Pressure Ventilation , former smoker,    breath sounds clear to auscultation       Cardiovascular hypertension, Pt. on medications and Pt. on home beta blockers + CAD, + Past MI, + CABG, + Peripheral Vascular Disease and +CHF  + dysrhythmias Atrial Fibrillation  Rhythm:Irregular Rate:Normal  '18 TTE - EF was in the range of 45% to 50%. There is hypokinesis of the midanteroseptal myocardium. Grade 2 diastolic dysfunction. Mild MR. Left atrium was mildly dilated. Right atrium was mildly dilated. Trivial TR. PASP 41 mm Hg  '18 Carotid US - Near complete occlusion of right ICA and common carotid, 1-39% stenosis of left ICA (s/p left CEA in 2015)  EKG - A-Fib, anteroseptal Q waves   Neuro/Psych negative neurological ROS  negative psych ROS   GI/Hepatic GERD  Controlled and Medicated,  Endo/Other  Obesity  Renal/GU      Musculoskeletal  (+) Arthritis , Chronic back pain   Abdominal   Peds  Hematology negative hematology ROS (+)   Anesthesia Other Findings   Reproductive/Obstetrics                            Anesthesia Physical  Anesthesia Plan  ASA: III  Anesthesia Plan: General   Post-op Pain Management:    Induction: Intravenous  PONV Risk Score and Plan: 2 and Treatment may vary due to age or medical condition, Ondansetron and Dexamethasone  Airway Management Planned: Oral ETT  Additional Equipment: None  Intra-op Plan:   Post-operative Plan: Extubation in OR  Informed Consent: I have  reviewed the patients History and Physical, chart, labs and discussed the procedure including the risks, benefits and alternatives for the proposed anesthesia with the patient or authorized representative who has indicated his/her understanding and acceptance.   Dental advisory given  Plan Discussed with: CRNA  Anesthesia Plan Comments:         Anesthesia Quick Evaluation

## 2017-07-29 ENCOUNTER — Encounter (HOSPITAL_COMMUNITY): Payer: Self-pay | Admitting: Internal Medicine

## 2017-07-29 DIAGNOSIS — I483 Typical atrial flutter: Secondary | ICD-10-CM | POA: Diagnosis not present

## 2017-07-29 DIAGNOSIS — I481 Persistent atrial fibrillation: Secondary | ICD-10-CM

## 2017-07-29 DIAGNOSIS — I2510 Atherosclerotic heart disease of native coronary artery without angina pectoris: Secondary | ICD-10-CM | POA: Diagnosis not present

## 2017-07-29 DIAGNOSIS — Z951 Presence of aortocoronary bypass graft: Secondary | ICD-10-CM | POA: Diagnosis not present

## 2017-07-29 DIAGNOSIS — E669 Obesity, unspecified: Secondary | ICD-10-CM | POA: Diagnosis not present

## 2017-07-29 DIAGNOSIS — E785 Hyperlipidemia, unspecified: Secondary | ICD-10-CM | POA: Diagnosis not present

## 2017-07-29 DIAGNOSIS — G4733 Obstructive sleep apnea (adult) (pediatric): Secondary | ICD-10-CM | POA: Diagnosis not present

## 2017-07-29 DIAGNOSIS — K219 Gastro-esophageal reflux disease without esophagitis: Secondary | ICD-10-CM | POA: Diagnosis not present

## 2017-07-29 DIAGNOSIS — Z6834 Body mass index (BMI) 34.0-34.9, adult: Secondary | ICD-10-CM | POA: Diagnosis not present

## 2017-07-29 DIAGNOSIS — I252 Old myocardial infarction: Secondary | ICD-10-CM | POA: Diagnosis not present

## 2017-07-29 DIAGNOSIS — M1990 Unspecified osteoarthritis, unspecified site: Secondary | ICD-10-CM | POA: Diagnosis not present

## 2017-07-29 DIAGNOSIS — I10 Essential (primary) hypertension: Secondary | ICD-10-CM | POA: Diagnosis not present

## 2017-07-29 NOTE — Progress Notes (Signed)
The patient and his wife have been given discharge instructions along with a new medication list and what to take today. He has follow up appointments and has been educated on cardiac cath site care. He is discharging with his wife via car.   Saddie Benders RN

## 2017-07-31 ENCOUNTER — Telehealth: Payer: Self-pay

## 2017-07-31 ENCOUNTER — Other Ambulatory Visit: Payer: Self-pay | Admitting: Internal Medicine

## 2017-07-31 DIAGNOSIS — R590 Localized enlarged lymph nodes: Secondary | ICD-10-CM

## 2017-07-31 NOTE — Telephone Encounter (Signed)
Per [name]- Pt needs 3-6 mo f/u chest CT d/t abnormal cardiac CT.  Pt scheduled for 10/28/2017 @ 1:30 pm prior to appt with [name].   Pt aware.  All questions answered.

## 2017-08-06 ENCOUNTER — Telehealth: Payer: Self-pay | Admitting: Internal Medicine

## 2017-08-06 DIAGNOSIS — G4733 Obstructive sleep apnea (adult) (pediatric): Secondary | ICD-10-CM

## 2017-08-06 NOTE — Telephone Encounter (Signed)
Called and spoke with patient, patient states that he was aware that he was unable to get the new CPAP machine due to not being able to get new machine until May 2nd. [facility] never changed the settings of the machine after order for that was placed. Placed new order for settings to be changed. Nothing further needed at this time.

## 2017-08-07 ENCOUNTER — Telehealth: Payer: Self-pay | Admitting: Internal Medicine

## 2017-08-07 NOTE — Telephone Encounter (Signed)
Patient and his wife called to report that his Apple Watch has read he is in atrial fibrillation since early Wednesday.  He states the readings have been as high as 120+ at times. The patient reports he still has no energy and is very tired (this has not improved since prior to the ablation 2/26). He states he feels "normal" about 1-3 hours daily. He denies SOB, CP. He states he has felt a racing feeling in his chest a couple times that were very short. His HR is now in the 90s and he feels fine, just tired. Confirmed with patient he is taking his medications as directed. Rescheduled AFib Clinic visit for Monday. The patient will take it easy over the weekend. He will call if symptoms occur.

## 2017-08-07 NOTE — Telephone Encounter (Signed)
New message   Patient spouse calling with concerns of afib. Please call   STAT if HR is under 50 or over 120 (normal HR is 60-100 beats per minute)  1) What is your heart rate?106 right now,  120+ on Apple watch earlier this morning   2) Do you have a log of your heart rate readings (document readings)? 103  3) Do you have any other symptoms? Weakness and no energy

## 2017-08-10 ENCOUNTER — Ambulatory Visit (HOSPITAL_COMMUNITY)
Admission: RE | Admit: 2017-08-10 | Discharge: 2017-08-10 | Disposition: A | Discharge status: Home / Self Care | Payer: Medicare Other | Source: Ambulatory Visit | Attending: Nurse Practitioner | Admitting: Nurse Practitioner

## 2017-08-10 ENCOUNTER — Other Ambulatory Visit (HOSPITAL_COMMUNITY): Payer: Self-pay | Admitting: *Deleted

## 2017-08-10 ENCOUNTER — Other Ambulatory Visit: Payer: Self-pay

## 2017-08-10 ENCOUNTER — Encounter (HOSPITAL_COMMUNITY): Payer: Self-pay | Admitting: Nurse Practitioner

## 2017-08-10 VITALS — BP 114/62 | HR 89 | Ht 70.0 in | Wt 244.8 lb

## 2017-08-10 DIAGNOSIS — Z7901 Long term (current) use of anticoagulants: Secondary | ICD-10-CM | POA: Diagnosis not present

## 2017-08-10 DIAGNOSIS — E785 Hyperlipidemia, unspecified: Secondary | ICD-10-CM | POA: Insufficient documentation

## 2017-08-10 DIAGNOSIS — I4819 Other persistent atrial fibrillation: Secondary | ICD-10-CM

## 2017-08-10 DIAGNOSIS — Z87891 Personal history of nicotine dependence: Secondary | ICD-10-CM | POA: Insufficient documentation

## 2017-08-10 DIAGNOSIS — Z85828 Personal history of other malignant neoplasm of skin: Secondary | ICD-10-CM | POA: Insufficient documentation

## 2017-08-10 DIAGNOSIS — I481 Persistent atrial fibrillation: Secondary | ICD-10-CM

## 2017-08-10 DIAGNOSIS — K219 Gastro-esophageal reflux disease without esophagitis: Secondary | ICD-10-CM | POA: Insufficient documentation

## 2017-08-10 DIAGNOSIS — I10 Essential (primary) hypertension: Secondary | ICD-10-CM | POA: Diagnosis not present

## 2017-08-10 DIAGNOSIS — Z951 Presence of aortocoronary bypass graft: Secondary | ICD-10-CM | POA: Insufficient documentation

## 2017-08-10 DIAGNOSIS — I2510 Atherosclerotic heart disease of native coronary artery without angina pectoris: Secondary | ICD-10-CM | POA: Insufficient documentation

## 2017-08-10 DIAGNOSIS — Z79899 Other long term (current) drug therapy: Secondary | ICD-10-CM | POA: Diagnosis not present

## 2017-08-10 DIAGNOSIS — Z96641 Presence of right artificial hip joint: Secondary | ICD-10-CM | POA: Diagnosis not present

## 2017-08-10 LAB — CBC
HEMATOCRIT: 30.1 % — AB (ref 39.0–52.0)
Hemoglobin: 9 g/dL — ABNORMAL LOW (ref 13.0–17.0)
MCH: 27.3 pg (ref 26.0–34.0)
MCHC: 29.9 g/dL — ABNORMAL LOW (ref 30.0–36.0)
MCV: 91.2 fL (ref 78.0–100.0)
PLATELETS: 341 10*3/uL (ref 150–400)
RBC: 3.3 MIL/uL — ABNORMAL LOW (ref 4.22–5.81)
RDW: 15.3 % (ref 11.5–15.5)
WBC: 8.5 10*3/uL (ref 4.0–10.5)

## 2017-08-10 LAB — BASIC METABOLIC PANEL
Anion gap: 8 (ref 5–15)
BUN: 21 mg/dL — AB (ref 6–20)
CALCIUM: 8.7 mg/dL — AB (ref 8.9–10.3)
CO2: 25 mmol/L (ref 22–32)
Chloride: 107 mmol/L (ref 101–111)
Creatinine, Ser: 1.49 mg/dL — ABNORMAL HIGH (ref 0.61–1.24)
GFR calc Af Amer: 51 mL/min — ABNORMAL LOW (ref >60)
GFR, EST NON AFRICAN AMERICAN: 44 mL/min — AB (ref >60)
GLUCOSE: 104 mg/dL — AB (ref 65–99)
POTASSIUM: 4.7 mmol/L (ref 3.5–5.1)
Sodium: 140 mmol/L (ref 135–145)

## 2017-08-10 NOTE — Patient Instructions (Addendum)
Cardioversion scheduled for Thursday, March 14th  - Arrive at the [facility] and go to admitting at 12:30pm  -Do not eat or drink anything after midnight the night prior to your procedure.  - Take all your medication with a sip of water prior to arrival.  - You will not be able to drive home after your procedure.  Increase amiodarone to 200mg  twice a day until next Monday (3/18)  then return to normal dosing  Move dr. Prudy Feeler appointment out for 7-10 days after cardioversion

## 2017-08-10 NOTE — Progress Notes (Signed)
Primary Care Physician: Caleb Noble, MD Referring Physician: Dr. Rayann Wolf Cardiologist: Dr. Gavin Wolf is a 77 y.o. male with a h/o afib on amiodarone, s/p afib ablation, 07/28/17, that is here for f/u. He had SR after procedure until 3/5 and on 3/6 went back into persistent afib which has has been associated with fatigue. No swallowing or groin issues since procedure.  Today, he denies symptoms of palpitations, chest pain, shortness of breath, orthopnea, PND, lower extremity edema, dizziness, presyncope, syncope, or neurologic sequela. The patient is tolerating medications without difficulties and is otherwise without complaint today.   Past Medical History:  Diagnosis Date  . Arthritis   . Basal cell carcinoma   . Carotid stenosis   . Coronary artery disease    Multvessel s/p CABG 2015  . Enlarged prostate   . Essential hypertension   . GERD (gastroesophageal reflux disease)   . Gout   . History of colon polyps   . History of kidney stones   . Hyperlipidemia   . Lumbar disc disease   . Persistent atrial fibrillation ([place])   . Sleep apnea    CPAP   Past Surgical History:  Procedure Laterality Date  . ABLATION OF DYSRHYTHMIC FOCUS  07/28/2017  . ATRIAL FIBRILLATION ABLATION N/A 07/28/2017   Procedure: ATRIAL FIBRILLATION ABLATION;  Surgeon: Caleb Grayer, MD;  Location: [facility];  Service: Cardiovascular;  Laterality: N/A;  . BACK SURGERY    . CARDIAC CATHETERIZATION  05/22/2014   Procedure: IABP INSERTION;  Surgeon: Leonie Man, MD;  Location: [facility] CATH LAB;  Service: Cardiovascular;;  . CARDIOVERSION N/A 04/29/2017   Procedure: CARDIOVERSION;  Surgeon: Caleb Sark, MD;  Location: [facility];  Service: Cardiovascular;  Laterality: N/A;  . Cataract surgery Right   . COLONOSCOPY    . CORONARY ARTERY BYPASS GRAFT N/A 05/22/2014   Procedure: CORONARY ARTERY BYPASS GRAFTING (CABG) times three using left internal mammary and right saphenous  vein.;  Surgeon: Caleb Nakayama, MD;  Location: [facility];  Service: Open Heart Surgery;  Laterality: N/A;  . ENDARTERECTOMY Left 02/17/2014   Procedure: ENDARTERECTOMY CAROTID WITH PATCH ANGIOPLASTY;  Surgeon: Caleb Misty, MD;  Location: [facility];  Service: Vascular;  Laterality: Left;  . ESOPHAGEAL DILATION N/A 08/22/2015   Procedure: ESOPHAGEAL DILATION;  Surgeon: Caleb Houston, MD;  [facility];  Service: Endoscopy;  Laterality: N/A;  . ESOPHAGOGASTRODUODENOSCOPY N/A 08/22/2015   Procedure: ESOPHAGOGASTRODUODENOSCOPY (EGD);  Surgeon: Caleb Houston, MD;  [facility];  Service: Endoscopy;  Laterality: N/A;  12:45 - moved to 1:55 - Caleb Wolf notified pt  . EYE SURGERY     cataract extraction (right) with repair macular tear , with IOL     right  . FRACTURE SURGERY     bilateral wrist fractures- one ORIF  . JOINT REPLACEMENT  2011   left knee  . LEFT HEART CATHETERIZATION WITH CORONARY ANGIOGRAM N/A 05/22/2014   Procedure: LEFT HEART CATHETERIZATION WITH CORONARY ANGIOGRAM;  Surgeon: Leonie Man, MD;  Location: [facility] CATH LAB;  Service: Cardiovascular;  Laterality: N/A;  . LITHOTRIPSY    . PARS PLANA VITRECTOMY W/ REPAIR OF MACULAR HOLE    . RHINOPLASTY    . TEE WITHOUT CARDIOVERSION N/A 04/29/2017   Procedure: TRANSESOPHAGEAL ECHOCARDIOGRAM (TEE) WITH PROPOFOL;  Surgeon: Caleb Sark, MD;  [facility];  Service: Cardiovascular;  Laterality: N/A;  . TONSILLECTOMY    . TOTAL HIP ARTHROPLASTY  12/08/2011   Procedure: TOTAL HIP ARTHROPLASTY;  Surgeon: Caleb Alf, MD;  Location: [facility];  Service: Orthopedics;  Laterality: Right;  . UPPER GI ENDOSCOPY  12/18/2015   Procedure: UPPER GI ENDOSCOPY;  Surgeon: Caleb Ok, MD;  Location: [facility];  Service: General;;  . WRIST SURGERY Right 9yrs ago  . WRIST SURGERY Left     Current Outpatient Medications  Medication Sig Dispense Refill  . amiodarone (PACERONE) 200 MG tablet Take 1 tablet (200 mg  total) by mouth daily. 90 tablet 3  . atorvastatin (LIPITOR) 40 MG tablet TAKE 1 TABLET BY MOUTH IN THE EVENING (Patient taking differently: TAKE 40 MG BY MOUTH IN THE EVENING) 90 tablet 2  . colchicine 0.6 MG tablet Take 0.6 mg by mouth daily as needed (for gout).     . cyclobenzaprine (FLEXERIL) 10 MG tablet Take 10 mg by mouth at bedtime as needed for muscle spasms  1  . doxazosin (CARDURA) 4 MG tablet Take 4 mg by mouth daily.  4  . finasteride (PROSCAR) 5 MG tablet Take 5 mg by mouth every evening.     . furosemide (LASIX) 40 MG tablet Take 40 mg by mouth daily.    Marland Kitchen lisinopril (PRINIVIL,ZESTRIL) 40 MG tablet Take 40 mg by mouth daily.    . metoprolol tartrate (LOPRESSOR) 25 MG tablet Take 1 tablet (25 mg total) by mouth 2 (two) times daily. 180 tablet 3  . nitroGLYCERIN (NITROSTAT) 0.4 MG SL tablet PLACE 1 TABLET UNDER THE TONGUE EVERY 5 MINUTES AS NEEDED FOR CHEST PAIN (Patient taking differently: Place 0.4 mg under the tongue every 5 (five) minutes as needed for chest pain. ) 25 tablet 3  . pantoprazole (PROTONIX) 40 MG tablet Take 1 tablet (40 mg total) by mouth daily before breakfast. (Patient taking differently: Take 40 mg by mouth every evening. ) 90 tablet 3  . potassium chloride SA (K-DUR,KLOR-CON) 20 MEQ tablet Take 1 tablet (20 mEq total) by mouth as directed. 2 times per wk. (Patient taking differently: Take 20 mEq by mouth See admin instructions. Take 20 MEQ by mouth 5 times weekly) 90 tablet 3  . rivaroxaban (XARELTO) 20 MG TABS tablet Take 1 tablet (20 mg total) by mouth daily. 30 tablet 11   No current facility-administered medications for this encounter.     Allergies  Allergen Reactions  . Codeine Sulfate Nausea Only    Social History   Socioeconomic History  . Marital status: Married    Spouse name: Not on file  . Number of children: Not on file  . Years of education: Not on file  . Highest education level: Not on file  Social Needs  . Financial resource strain:  Not on file  . Food insecurity - worry: Not on file  . Food insecurity - inability: Not on file  . Transportation needs - medical: Not on file  . Transportation needs - non-medical: Not on file  Occupational History  . Occupation: retired    Comment: [place] [employer]  Tobacco Use  . Smoking status: Former Smoker    Packs/day: 1.50    Years: 25.00    Pack years: 37.50    Types: Cigarettes    Start date: 08/08/1960    Last attempt to quit: 06/02/1982    Years since quitting: 35.2  . Smokeless tobacco: Never Used  . Tobacco comment: quit smoking 30+yrs ago  Substance and Sexual Activity  . Alcohol use: No    Alcohol/week: 0.0 oz  Comment: occasionally   . Drug use: No  . Sexual activity: Yes  Other Topics Concern  . Not on file  Social History Narrative  . Not on file    Family History  Problem Relation Age of Onset  . Allergies Mother   . Heart disease Mother   . Hypertension Mother   . Heart disease Father        MI    ROS- All systems are reviewed and negative except as per the HPI above  Physical Exam: Vitals:   08/10/17 0923  BP: 114/62  Pulse: 89  Weight: 244 lb 12.8 oz (111 kg)  Height: 5\' 10"  (1.778 m)   Wt Readings from Last 3 Encounters:  08/10/17 244 lb 12.8 oz (111 kg)  07/29/17 239 lb 6.7 oz (108.6 kg)  07/20/17 244 lb (110.7 kg)    Labs: Lab Results  Component Value Date   NA 145 (H) 07/13/2017   K 5.1 07/13/2017   CL 104 07/13/2017   CO2 21 07/13/2017   GLUCOSE 93 07/13/2017   BUN 22 07/13/2017   CREATININE 1.59 (H) 07/13/2017   CALCIUM 8.9 07/13/2017   PHOS 3.3 04/29/2017   MG 1.9 04/30/2017   Lab Results  Component Value Date   INR 2.6 09/13/2014   Lab Results  Component Value Date   CHOL 134 03/22/2015   HDL 41 03/22/2015   LDLCALC 75 03/22/2015   TRIG 92 03/22/2015     GEN- The patient is well appearing, alert and oriented x 3 today.   Head- normocephalic, atraumatic Eyes-  Sclera clear, conjunctiva pink Ears-  hearing intact Oropharynx- clear Neck- supple, no JVP Lymph- no cervical lymphadenopathy Lungs- Clear to ausculation bilaterally, normal work of breathing Heart- irregular rate and rhythm, no murmurs, rubs or gallops, PMI not laterally displaced GI- soft, NT, ND, + BS Extremities- no clubbing, cyanosis, or edema MS- no significant deformity or atrophy Skin- no rash or lesion Psych- euthymic mood, full affect Neuro- strength and sensation are intact  EKG- afib at 89 bpm, Qrs int 88 ms, qtc 472 ms Epic records reviewed    Assessment and Plan: 1. Persistent symptomatic afib He is rate controlled Feels fatigued  Increase amiodarone to 200 mg bid x one week  Cardioversion has been scheduled for 3/14, procedure explained  He has not missed any doses of xarelto 20 mg daily  He had an appointment with Dr Raliegh Ip 3/13 but was asked to push this appointment out for 10 days so he can have f/u after the cardioversion  F/u in the afib clinic as needed  [place]. Rogelio Winbush, [facility] [address] [place],  [address] [phone]

## 2017-08-12 ENCOUNTER — Ambulatory Visit: Payer: Medicare Other | Admitting: Cardiovascular Disease

## 2017-08-13 ENCOUNTER — Encounter (HOSPITAL_COMMUNITY): Payer: Self-pay | Admitting: *Deleted

## 2017-08-13 ENCOUNTER — Ambulatory Visit (HOSPITAL_COMMUNITY): Payer: Medicare Other | Admitting: Anesthesiology

## 2017-08-13 ENCOUNTER — Encounter (HOSPITAL_COMMUNITY)
Admission: RE | Disposition: A | Discharge status: Home / Self Care | Payer: Self-pay | Source: Ambulatory Visit | Attending: Internal Medicine

## 2017-08-13 ENCOUNTER — Other Ambulatory Visit: Payer: Self-pay

## 2017-08-13 ENCOUNTER — Ambulatory Visit (HOSPITAL_COMMUNITY)
Admission: RE | Admit: 2017-08-13 | Discharge: 2017-08-13 | Disposition: A | Discharge status: Home / Self Care | Payer: Medicare Other | Source: Ambulatory Visit | Attending: Internal Medicine | Admitting: Internal Medicine

## 2017-08-13 DIAGNOSIS — Z951 Presence of aortocoronary bypass graft: Secondary | ICD-10-CM | POA: Diagnosis not present

## 2017-08-13 DIAGNOSIS — G4730 Sleep apnea, unspecified: Secondary | ICD-10-CM | POA: Diagnosis not present

## 2017-08-13 DIAGNOSIS — K219 Gastro-esophageal reflux disease without esophagitis: Secondary | ICD-10-CM | POA: Diagnosis not present

## 2017-08-13 DIAGNOSIS — I509 Heart failure, unspecified: Secondary | ICD-10-CM | POA: Diagnosis not present

## 2017-08-13 DIAGNOSIS — Z79899 Other long term (current) drug therapy: Secondary | ICD-10-CM | POA: Diagnosis not present

## 2017-08-13 DIAGNOSIS — M109 Gout, unspecified: Secondary | ICD-10-CM | POA: Insufficient documentation

## 2017-08-13 DIAGNOSIS — I481 Persistent atrial fibrillation: Secondary | ICD-10-CM | POA: Insufficient documentation

## 2017-08-13 DIAGNOSIS — I252 Old myocardial infarction: Secondary | ICD-10-CM | POA: Diagnosis not present

## 2017-08-13 DIAGNOSIS — I110 Hypertensive heart disease with heart failure: Secondary | ICD-10-CM | POA: Diagnosis not present

## 2017-08-13 DIAGNOSIS — Z87891 Personal history of nicotine dependence: Secondary | ICD-10-CM | POA: Diagnosis not present

## 2017-08-13 DIAGNOSIS — Z7901 Long term (current) use of anticoagulants: Secondary | ICD-10-CM | POA: Diagnosis not present

## 2017-08-13 DIAGNOSIS — I4891 Unspecified atrial fibrillation: Secondary | ICD-10-CM | POA: Diagnosis present

## 2017-08-13 DIAGNOSIS — I2510 Atherosclerotic heart disease of native coronary artery without angina pectoris: Secondary | ICD-10-CM | POA: Diagnosis not present

## 2017-08-13 DIAGNOSIS — I10 Essential (primary) hypertension: Secondary | ICD-10-CM | POA: Diagnosis not present

## 2017-08-13 DIAGNOSIS — E785 Hyperlipidemia, unspecified: Secondary | ICD-10-CM | POA: Diagnosis not present

## 2017-08-13 HISTORY — PX: CARDIOVERSION: SHX1299

## 2017-08-13 SURGERY — CARDIOVERSION
Anesthesia: General

## 2017-08-13 MED ORDER — PROPOFOL 10 MG/ML IV BOLUS
INTRAVENOUS | Status: DC | PRN
  Administered 2017-08-13: 90 mg via INTRAVENOUS

## 2017-08-13 MED ORDER — LIDOCAINE HCL (CARDIAC) 20 MG/ML IV SOLN
INTRAVENOUS | Status: DC | PRN
  Administered 2017-08-13: 20 mg via INTRAVENOUS

## 2017-08-13 MED ORDER — SODIUM CHLORIDE 0.9 % IV SOLN
INTRAVENOUS | Status: DC
  Administered 2017-08-13: 500 mL via INTRAVENOUS

## 2017-08-13 NOTE — H&P (Signed)
   INTERVAL PROCEDURE H&P  History and Physical Interval Note:  08/13/2017 12:18 PM  Caleb Wolf has presented today for their planned procedure. The various methods of treatment have been discussed with the patient and family. After consideration of risks, benefits and other options for treatment, the patient has consented to the procedure.  The patients' outpatient history has been reviewed, patient examined, and no change in status from most recent office note within the past 30 days. I have reviewed the patients' chart and labs and will proceed as planned. Questions were answered to the patient's satisfaction.   Pixie Casino, MD, [facility], [place] Director of the Advanced Lipid Disorders &  Cardiovascular Risk Reduction Clinic Diplomate of the American Board of Clinical Lipidology Attending Cardiologist  Direct Dial: [phone]  Fax: [phone]  Website:  [email or web address] 08/13/2017, 12:18 PM

## 2017-08-13 NOTE — Anesthesia Preprocedure Evaluation (Signed)
Anesthesia Evaluation  Patient identified by MRN, date of birth, ID band Patient awake    Reviewed: Allergy & Precautions, NPO status , Patient's Chart, lab work & pertinent test results, reviewed documented beta blocker date and time   Airway Mallampati: II  TM Distance: >3 FB Neck ROM: Full    Dental  (+) Teeth Intact, Dental Advisory Given   Pulmonary sleep apnea and Continuous Positive Airway Pressure Ventilation , former smoker,    breath sounds clear to auscultation       Cardiovascular hypertension, Pt. on medications and Pt. on home beta blockers + CAD, + Past MI, + CABG, + Peripheral Vascular Disease and +CHF  + dysrhythmias Atrial Fibrillation  Rhythm:Irregular Rate:Normal  '18 TTE - EF was in the range of 45% to 50%. There is hypokinesis of the midanteroseptal myocardium. Grade 2 diastolic dysfunction. Mild MR. Left atrium was mildly dilated. Right atrium was mildly dilated. Trivial TR. PASP 41 mm Hg  '18 Carotid US - Near complete occlusion of right ICA and common carotid, 1-39% stenosis of left ICA (s/p left CEA in 2015)  EKG - A-Fib, anteroseptal Q waves   Neuro/Psych negative neurological ROS  negative psych ROS   GI/Hepatic GERD  Controlled and Medicated,  Endo/Other  Obesity  Renal/GU      Musculoskeletal  (+) Arthritis , Chronic back pain   Abdominal   Peds  Hematology negative hematology ROS (+)   Anesthesia Other Findings   Reproductive/Obstetrics                             Anesthesia Physical  Anesthesia Plan  ASA: III  Anesthesia Plan: General   Post-op Pain Management:    Induction: Intravenous  PONV Risk Score and Plan: 2 and Treatment may vary due to age or medical condition and Propofol infusion  Airway Management Planned: Natural Airway and Mask  Additional Equipment: None  Intra-op Plan:   Post-operative Plan:   Informed Consent: I have reviewed  the patients History and Physical, chart, labs and discussed the procedure including the risks, benefits and alternatives for the proposed anesthesia with the patient or authorized representative who has indicated his/her understanding and acceptance.     Plan Discussed with: CRNA  Anesthesia Plan Comments:         Anesthesia Quick Evaluation

## 2017-08-13 NOTE — CV Procedure (Signed)
   CARDIOVERSION NOTE  Procedure: Electrical Cardioversion Indications:  Atrial Fibrillation  Procedure Details:  Consent: Risks of procedure as well as the alternatives and risks of each were explained to the (patient/caregiver).  Consent for procedure obtained.  Time Out: Verified patient identification, verified procedure, site/side was marked, verified correct patient position, special equipment/implants available, medications/allergies/relevent history reviewed, required imaging and test results available.  Performed  Patient placed on cardiac monitor, pulse oximetry, supplemental oxygen as necessary.  Sedation given: propofol per anesthesia Pacer pads placed anterior and posterior chest.  Cardioverted 1 time(s).  Cardioverted at 150J biphasic.  Impression: Findings: Post procedure EKG shows: NSR Complications: None Patient did tolerate procedure well.  Plan: 1. Successful DCCV with a single 150J biphasic shock.  Time Spent Directly with the Patient:  30 minutes   Pixie Casino, MD, [facility], [place] Director of the Advanced Lipid Disorders &  Cardiovascular Risk Reduction Clinic Diplomate of the American Board of Clinical Lipidology Attending Cardiologist  Direct Dial: [phone]  Fax: [phone]  Website:  [email or web address] 08/13/2017, 2:03 PM

## 2017-08-13 NOTE — Anesthesia Postprocedure Evaluation (Signed)
Anesthesia Post Note  Patient: Caleb Wolf  Procedure(s) Performed: CARDIOVERSION (N/A )     Patient location during evaluation: PACU Anesthesia Type: General Level of consciousness: awake and alert Pain management: pain level controlled Vital Signs Assessment: post-procedure vital signs reviewed and stable Respiratory status: spontaneous breathing, nonlabored ventilation, respiratory function stable and patient connected to nasal cannula oxygen Cardiovascular status: blood pressure returned to baseline and stable Postop Assessment: no apparent nausea or vomiting Anesthetic complications: no    Last Vitals:  Vitals:   08/13/17 1410 08/13/17 1420  BP: (!) 103/44 (!) 95/44  Pulse: (!) 52 (!) 51  Resp: 19 19  Temp: 36.6 C   SpO2: 100% 99%    Last Pain:  Vitals:   08/13/17 1410  TempSrc: Axillary                 Tiajuana Amass

## 2017-08-13 NOTE — Transfer of Care (Signed)
Immediate Anesthesia Transfer of Care Note  Patient: Caleb Wolf  Procedure(s) Performed: CARDIOVERSION (N/A )  Patient Location: Endoscopy Unit  Anesthesia Type:General  Level of Consciousness: awake, alert , oriented and patient cooperative  Airway & Oxygen Therapy: Patient Spontanous Breathing and Patient connected to nasal cannula oxygen  Post-op Assessment: Report given to RN and Post -op Vital signs reviewed and stable  Post vital signs: Reviewed and stable  Last Vitals:  Vitals:   08/13/17 1233  BP: 127/76  Pulse: 83  Resp: 15  Temp: 36.7 C  SpO2: 98%    Last Pain:  Vitals:   08/13/17 1233  TempSrc: Oral         Complications: No apparent anesthesia complications

## 2017-08-17 DIAGNOSIS — D509 Iron deficiency anemia, unspecified: Secondary | ICD-10-CM | POA: Diagnosis not present

## 2017-08-17 DIAGNOSIS — D649 Anemia, unspecified: Secondary | ICD-10-CM | POA: Diagnosis not present

## 2017-08-20 ENCOUNTER — Ambulatory Visit (HOSPITAL_COMMUNITY): Payer: Medicare Other | Admitting: Nurse Practitioner

## 2017-08-20 DIAGNOSIS — D509 Iron deficiency anemia, unspecified: Secondary | ICD-10-CM | POA: Diagnosis not present

## 2017-08-20 DIAGNOSIS — I482 Chronic atrial fibrillation: Secondary | ICD-10-CM | POA: Diagnosis not present

## 2017-08-25 ENCOUNTER — Encounter (INDEPENDENT_AMBULATORY_CARE_PROVIDER_SITE_OTHER): Payer: Self-pay | Admitting: Internal Medicine

## 2017-08-25 ENCOUNTER — Ambulatory Visit (INDEPENDENT_AMBULATORY_CARE_PROVIDER_SITE_OTHER): Payer: Medicare Other | Admitting: Internal Medicine

## 2017-08-25 ENCOUNTER — Ambulatory Visit (INDEPENDENT_AMBULATORY_CARE_PROVIDER_SITE_OTHER): Payer: Medicare Other | Admitting: Cardiovascular Disease

## 2017-08-25 ENCOUNTER — Encounter: Payer: Self-pay | Admitting: Cardiovascular Disease

## 2017-08-25 ENCOUNTER — Telehealth (INDEPENDENT_AMBULATORY_CARE_PROVIDER_SITE_OTHER): Payer: Self-pay | Admitting: *Deleted

## 2017-08-25 VITALS — BP 120/68 | HR 65 | Temp 97.7°F | Resp 18 | Ht 70.0 in | Wt 246.8 lb

## 2017-08-25 VITALS — BP 118/58 | HR 57 | Ht 70.0 in | Wt 246.0 lb

## 2017-08-25 DIAGNOSIS — I10 Essential (primary) hypertension: Secondary | ICD-10-CM

## 2017-08-25 DIAGNOSIS — G4733 Obstructive sleep apnea (adult) (pediatric): Secondary | ICD-10-CM

## 2017-08-25 DIAGNOSIS — K220 Achalasia of cardia: Secondary | ICD-10-CM

## 2017-08-25 DIAGNOSIS — D649 Anemia, unspecified: Secondary | ICD-10-CM | POA: Diagnosis not present

## 2017-08-25 DIAGNOSIS — R590 Localized enlarged lymph nodes: Secondary | ICD-10-CM | POA: Diagnosis not present

## 2017-08-25 DIAGNOSIS — I6529 Occlusion and stenosis of unspecified carotid artery: Secondary | ICD-10-CM | POA: Diagnosis not present

## 2017-08-25 DIAGNOSIS — Z9889 Other specified postprocedural states: Secondary | ICD-10-CM

## 2017-08-25 DIAGNOSIS — I481 Persistent atrial fibrillation: Secondary | ICD-10-CM | POA: Diagnosis not present

## 2017-08-25 DIAGNOSIS — I25708 Atherosclerosis of coronary artery bypass graft(s), unspecified, with other forms of angina pectoris: Secondary | ICD-10-CM

## 2017-08-25 DIAGNOSIS — D509 Iron deficiency anemia, unspecified: Secondary | ICD-10-CM

## 2017-08-25 DIAGNOSIS — I5022 Chronic systolic (congestive) heart failure: Secondary | ICD-10-CM | POA: Diagnosis not present

## 2017-08-25 DIAGNOSIS — I4819 Other persistent atrial fibrillation: Secondary | ICD-10-CM

## 2017-08-25 MED ORDER — FLINTSTONES PLUS IRON PO CHEW: 1.0000 | CHEWABLE_TABLET | Freq: Two times a day (BID) | ORAL | Status: DC

## 2017-08-25 NOTE — Progress Notes (Signed)
Reason for consultation:  Iron deficiency anemia.  History of present illness.:  Patient is 77 year old Caucasian male who is referred through courtesy of Dr. Asencion Noble for evaluation of recently discovered iron deficiency anemia. Patient has history of achalasia for which she underwent laparoscopic Heller's myotomy with partial wrap in July 2017.  Postprocedure he was having GERD symptoms and begun on pantoprazole.  He was last seen in November 2018 and he was doing well from GI standpoint but he was noted to have rapid heart rate and suspected to have SVT.  He was transferred to emergency room and he was subsequently hospitalized and diagnosed with atrial fibrillation. He was cardioverted but returned back to atrial fibrillation.  He had ablation on 07/28/2017 and return back to atrial fibrillation and had to be cardioverted again.  Since then he has been in normal sinus rhythm.  Patient has been on anticoagulant since November 2018. He has appointment to see Dr. Bronson Ing this afternoon. Patient had routine blood work by Dr. Willey Blade on 08/10/2017 and his H&H was 9 and 30.1.  He had further studies confirming iron deficiency anemia.  These results are reviewed under lab data.  His stool was guaiac negative. Patient states his heartburn is well controlled with therapy he denies dysphagia nausea or vomiting.  He also denies abdominal pain melena or rectal bleeding or hematuria. He states he has been feeling tired but he just felt it was due to his illness and deconditioning.  He does not have craving for ice or any particular food.  His wife states that since he was found to have iron deficiency he is been eating green leafy vegetables and other foods which are rich in iron.  He has gained a few pounds recently but he feels it is due to lower extremity edema.  Dr. Willey Blade has advised him to take an extra dose of furosemide in the afternoon if he needs.  His bowels move daily.  He does not have diarrhea and/or  constipation.  His last colonoscopy was in January 2009. Family history is negative for CRC.   Patient does not drink alcohol. He smoked cigarettes for 22 years but quit in 1984.  Current Medications: Outpatient Encounter Medications as of 08/25/2017  Medication Sig  . amiodarone (PACERONE) 200 MG tablet Take 1 tablet (200 mg total) by mouth daily.  Marland Kitchen atorvastatin (LIPITOR) 40 MG tablet TAKE 1 TABLET BY MOUTH IN THE EVENING (Patient taking differently: TAKE 40 MG BY MOUTH IN THE EVENING)  . colchicine 0.6 MG tablet Take 0.6 mg by mouth daily as needed (for gout).   . cyclobenzaprine (FLEXERIL) 10 MG tablet Take 10 mg by mouth at bedtime as needed for muscle spasms  . doxazosin (CARDURA) 4 MG tablet Take 4 mg by mouth daily.  . finasteride (PROSCAR) 5 MG tablet Take 5 mg by mouth every evening.   . furosemide (LASIX) 40 MG tablet Take 40 mg by mouth daily.  Marland Kitchen lisinopril (PRINIVIL,ZESTRIL) 40 MG tablet Take 40 mg by mouth daily.  . metoprolol tartrate (LOPRESSOR) 25 MG tablet Take 1 tablet (25 mg total) by mouth 2 (two) times daily.  . nitroGLYCERIN (NITROSTAT) 0.4 MG SL tablet PLACE 1 TABLET UNDER THE TONGUE EVERY 5 MINUTES AS NEEDED FOR CHEST PAIN (Patient taking differently: Place 0.4 mg under the tongue every 5 (five) minutes as needed for chest pain. )  . pantoprazole (PROTONIX) 40 MG tablet Take 1 tablet (40 mg total) by mouth daily before breakfast. (Patient taking differently:  Take 40 mg by mouth every evening. )  . potassium chloride SA (K-DUR,KLOR-CON) 20 MEQ tablet Take 1 tablet (20 mEq total) by mouth as directed. 2 times per wk. (Patient taking differently: Take 20 mEq by mouth See admin instructions. Take 20 MEQ by mouth 5 times weekly)  . rivaroxaban (XARELTO) 20 MG TABS tablet Take 1 tablet (20 mg total) by mouth daily.   No facility-administered encounter medications on file as of 08/25/2017.    Past Medical History:  Diagnosis Date  . Arthritis   . Basal cell carcinoma   .  Carotid stenosis   . Coronary artery disease    Multvessel s/p CABG 2015  . Enlarged prostate   . Essential hypertension   . GERD (gastroesophageal reflux disease)   . Gout   . History of colon polyps   . History of kidney stones   . Hyperlipidemia   . Lumbar disc disease   . Persistent atrial fibrillation ([place])   . Sleep apnea    CPAP   Past Surgical History:  Procedure Laterality Date  . ABLATION OF DYSRHYTHMIC FOCUS  07/28/2017  . ATRIAL FIBRILLATION ABLATION N/A 07/28/2017   Procedure: ATRIAL FIBRILLATION ABLATION;  Surgeon: Thompson Grayer, MD;  Location: [place] [facility];  Service: Cardiovascular;  Laterality: N/A;  . BACK SURGERY    . CARDIAC CATHETERIZATION  05/22/2014   Procedure: IABP INSERTION;  Surgeon: Leonie Man, MD;  Location: [facility] CATH LAB;  Service: Cardiovascular;;  . CARDIOVERSION N/A 04/29/2017   Procedure: CARDIOVERSION;  Surgeon: Satira Sark, MD;  [facility];  Service: Cardiovascular;  Laterality: N/A;  . CARDIOVERSION N/A 08/13/2017   Procedure: CARDIOVERSION;  Surgeon: Pixie Casino, MD;  Location: [facility] ENDOSCOPY;  Service: Cardiovascular;  Laterality: N/A;  . Cataract surgery Right   . COLONOSCOPY    . CORONARY ARTERY BYPASS GRAFT N/A 05/22/2014   Procedure: CORONARY ARTERY BYPASS GRAFTING (CABG) times three using left internal mammary and right saphenous vein.;  Surgeon: Melrose Nakayama, MD;  Location: [place];  Service: Open Heart Surgery;  Laterality: N/A;  . ENDARTERECTOMY Left 02/17/2014   Procedure: ENDARTERECTOMY CAROTID WITH PATCH ANGIOPLASTY;  Surgeon: Mal Misty, MD;  Location: [place];  Service: Vascular;  Laterality: Left;  . ESOPHAGEAL DILATION N/A 08/22/2015   Procedure: ESOPHAGEAL DILATION;  Surgeon: Rogene Houston, MD;  [facility];  Service: Endoscopy;  Laterality: N/A;  . ESOPHAGOGASTRODUODENOSCOPY N/A 08/22/2015   Procedure: ESOPHAGOGASTRODUODENOSCOPY (EGD);  Surgeon: Rogene Houston, MD;  [facility];  Service: Endoscopy;  Laterality: N/A;  12:45 - moved to 1:55 - Ann notified pt  . EYE SURGERY     cataract extraction (right) with repair macular tear , with IOL     right  . FRACTURE SURGERY     bilateral wrist fractures- one ORIF  . JOINT REPLACEMENT  2011   left knee  . LEFT HEART CATHETERIZATION WITH CORONARY ANGIOGRAM N/A 05/22/2014   Procedure: LEFT HEART CATHETERIZATION WITH CORONARY ANGIOGRAM;  Surgeon: Leonie Man, MD;  Location: [facility] CATH LAB;  Service: Cardiovascular;  Laterality: N/A;  . LITHOTRIPSY    . PARS PLANA VITRECTOMY W/ REPAIR OF MACULAR HOLE    . RHINOPLASTY    . TEE WITHOUT CARDIOVERSION N/A 04/29/2017   Procedure: TRANSESOPHAGEAL ECHOCARDIOGRAM (TEE) WITH PROPOFOL;  Surgeon: Satira Sark, MD;  [facility];  Service: Cardiovascular;  Laterality: N/A;  . TONSILLECTOMY    . TOTAL HIP ARTHROPLASTY  12/08/2011   Procedure: TOTAL HIP ARTHROPLASTY;  Surgeon: Gearlean Alf, MD;  Location: [facility];  Service: Orthopedics;  Laterality: Right;  . UPPER GI ENDOSCOPY  12/18/2015   Procedure: UPPER GI ENDOSCOPY;  Surgeon: Ralene Ok, MD;  Location: [facility];  Service: General;;  . WRIST SURGERY Right 75yrs ago  . WRIST SURGERY Left    Allergies  Allergen Reactions  . Codeine Sulfate Nausea Only     Physical Examination: Blood pressure 120/68, pulse 65, temperature 97.7 F (36.5 C), temperature source Oral, resp. rate 18, height 5\' 10"  (1.778 m), weight 246 lb 12.8 oz (111.9 kg). Patient is alert and in no acute distress. Conjunctiva is pale. Sclera is nonicteric Oropharyngeal mucosa is normal. No neck masses or thyromegaly noted. Cardiac exam with regular rhythm normal S1 and split S2. No murmur or gallop noted. Lungs are clear to auscultation. Abdomen is protuberant but soft and nontender without organomegaly or masses. Rectal examination deferred. He has 2+ pitting edema involving both legs. No clubbing or Koilonychial  noted.  Labs/studies Results:  H/H was 13.4 and 42.4 04/28/2017 H/H was 10 and 32.1 on 07/13/2017 H/H was 9.0 and 30.1 on 08/10/2017  Lab data from 08/17/2017  WBC 6.4 H&H 9.3 and 30.8, MCV 91 and platelet count 268K. Serum iron 34 TIBC 328 and saturation 10%. Serum O37 858 and folic acid 6.5.   Assessment:  #1.  Patient has developed iron deficiency anemia.  Serum iron and saturation are low but ferritin is low normal and that could be an acute phase reactant.  Stool was guaiac negative by Dr. Willey Blade.  Patient has been on chronic PPI therapy for GERD.  Differential diagnosis includes occult lesion in upper or lower GI tract or impaired iron absorption due to chronic PPI therapy.  His hemoglobin has come up some in the last 2 weeks which is reassuring.  Patient will need diagnostic EGD and colonoscopy for which he would need to be off anticoagulant for 2 days.  #2.  History of achalasia.  Status post laparoscopic myotomy with partial wrap July 2017.  Postprocedure he developed GERD symptoms and has responded nicely to PPI.  Recommendations:  Flintstone chewable with iron twice daily. Diagnostic esophagogastroduodenoscopy and colonoscopy in near future. Will check with Dr. Bronson Ing if patient could come off anticoagulant for 2 days. Patient advised to report to emergency room if he has frank rectal bleeding or tarry stool. Further recommendations to follow.

## 2017-08-25 NOTE — Patient Instructions (Addendum)
Your physician recommends that you schedule a follow-up appointment in: 3 months with Dr.Koneswaran    Your physician recommends that you continue on your current medications as directed. Please refer to the Current Medication list given to you today.   If you need a refill on your cardiac medications before your next appointment, please call your pharmacy.   No lab work or tests ordered today.     Thank you for choosing [facility] !         

## 2017-08-25 NOTE — Progress Notes (Signed)
SUBJECTIVE: The patient presents for routine follow-up.  He underwent an atrial fibrillation and flutter ablation in February. He went back into atrial fibrillation in early March and then underwent cardioversion on 3/14. He also has a history of coronary artery disease and CABG along with carotid artery stenosis status post left carotid endarterectomy and chronic systolic heart failure, LVEF 45-50%.  Although he is feeling fatigued, he feels better with respect to cardiac issues.  He denies chest pain and palpitations.  He is anemic.  His hemoglobin was found to be 9 and had been 13.4 on 04/28/17.  He denies hematochezia.  He saw gastroenterology this morning.  There is a question if this is related to iron malabsorption.  He is supposed to start taking Flintstones with iron beginning today.  Review of Systems: As per "subjective", otherwise negative.  Allergies  Allergen Reactions  . Codeine Sulfate Nausea Only    Current Outpatient Medications  Medication Sig Dispense Refill  . amiodarone (PACERONE) 200 MG tablet Take 1 tablet (200 mg total) by mouth daily. 90 tablet 3  . atorvastatin (LIPITOR) 40 MG tablet TAKE 1 TABLET BY MOUTH IN THE EVENING (Patient taking differently: TAKE 40 MG BY MOUTH IN THE EVENING) 90 tablet 2  . colchicine 0.6 MG tablet Take 0.6 mg by mouth daily as needed (for gout).     . cyclobenzaprine (FLEXERIL) 10 MG tablet Take 10 mg by mouth at bedtime as needed for muscle spasms  1  . doxazosin (CARDURA) 4 MG tablet Take 4 mg by mouth daily.  4  . finasteride (PROSCAR) 5 MG tablet Take 5 mg by mouth every evening.     . furosemide (LASIX) 40 MG tablet Take 40 mg by mouth daily.    Marland Kitchen lisinopril (PRINIVIL,ZESTRIL) 40 MG tablet Take 40 mg by mouth daily.    . metoprolol tartrate (LOPRESSOR) 25 MG tablet Take 1 tablet (25 mg total) by mouth 2 (two) times daily. 180 tablet 3  . nitroGLYCERIN (NITROSTAT) 0.4 MG SL tablet PLACE 1 TABLET UNDER THE TONGUE EVERY 5  MINUTES AS NEEDED FOR CHEST PAIN (Patient taking differently: Place 0.4 mg under the tongue every 5 (five) minutes as needed for chest pain. ) 25 tablet 3  . pantoprazole (PROTONIX) 40 MG tablet Take 1 tablet (40 mg total) by mouth daily before breakfast. (Patient taking differently: Take 40 mg by mouth every evening. ) 90 tablet 3  . Pediatric Multivitamins-Iron (FLINTSTONES PLUS IRON) chewable tablet Chew 1 tablet by mouth 2 (two) times daily.    . potassium chloride SA (K-DUR,KLOR-CON) 20 MEQ tablet Take 1 tablet (20 mEq total) by mouth as directed. 2 times per wk. (Patient taking differently: Take 20 mEq by mouth See admin instructions. Take 20 MEQ by mouth 5 times weekly) 90 tablet 3  . rivaroxaban (XARELTO) 20 MG TABS tablet Take 1 tablet (20 mg total) by mouth daily. 30 tablet 11   No current facility-administered medications for this visit.     Past Medical History:  Diagnosis Date  . Arthritis   . Basal cell carcinoma   . Carotid stenosis   . Coronary artery disease    Multvessel s/p CABG 2015  . Enlarged prostate   . Essential hypertension   . GERD (gastroesophageal reflux disease)   . Gout   . History of colon polyps   . History of kidney stones   . Hyperlipidemia   . Lumbar disc disease   . Persistent atrial fibrillation (  [place])   . Sleep apnea    CPAP    Past Surgical History:  Procedure Laterality Date  . ABLATION OF DYSRHYTHMIC FOCUS  07/28/2017  . ATRIAL FIBRILLATION ABLATION N/A 07/28/2017   Procedure: ATRIAL FIBRILLATION ABLATION;  Surgeon: Thompson Grayer, MD;  Location: [place] [facility];  Service: Cardiovascular;  Laterality: N/A;  . BACK SURGERY    . CARDIAC CATHETERIZATION  05/22/2014   Procedure: IABP INSERTION;  Surgeon: Leonie Man, MD;  Location: [facility] CATH LAB;  Service: Cardiovascular;;  . CARDIOVERSION N/A 04/29/2017   Procedure: CARDIOVERSION;  Surgeon: Satira Sark, MD;  [facility];  Service: Cardiovascular;  Laterality: N/A;  .  CARDIOVERSION N/A 08/13/2017   Procedure: CARDIOVERSION;  Surgeon: Pixie Casino, MD;  Location: [facility] ENDOSCOPY;  Service: Cardiovascular;  Laterality: N/A;  . Cataract surgery Right   . COLONOSCOPY    . CORONARY ARTERY BYPASS GRAFT N/A 05/22/2014   Procedure: CORONARY ARTERY BYPASS GRAFTING (CABG) times three using left internal mammary and right saphenous vein.;  Surgeon: Melrose Nakayama, MD;  Location: [place];  Service: Open Heart Surgery;  Laterality: N/A;  . ENDARTERECTOMY Left 02/17/2014   Procedure: ENDARTERECTOMY CAROTID WITH PATCH ANGIOPLASTY;  Surgeon: Mal Misty, MD;  Location: [place];  Service: Vascular;  Laterality: Left;  . ESOPHAGEAL DILATION N/A 08/22/2015   Procedure: ESOPHAGEAL DILATION;  Surgeon: Rogene Houston, MD;  [facility];  Service: Endoscopy;  Laterality: N/A;  . ESOPHAGOGASTRODUODENOSCOPY N/A 08/22/2015   Procedure: ESOPHAGOGASTRODUODENOSCOPY (EGD);  Surgeon: Rogene Houston, MD;  [facility];  Service: Endoscopy;  Laterality: N/A;  12:45 - moved to 1:55 - Ann notified pt  . EYE SURGERY     cataract extraction (right) with repair macular tear , with IOL     right  . FRACTURE SURGERY     bilateral wrist fractures- one ORIF  . JOINT REPLACEMENT  2011   left knee  . LEFT HEART CATHETERIZATION WITH CORONARY ANGIOGRAM N/A 05/22/2014   Procedure: LEFT HEART CATHETERIZATION WITH CORONARY ANGIOGRAM;  Surgeon: Leonie Man, MD;  Location: [facility] CATH LAB;  Service: Cardiovascular;  Laterality: N/A;  . LITHOTRIPSY    . PARS PLANA VITRECTOMY W/ REPAIR OF MACULAR HOLE    . RHINOPLASTY    . TEE WITHOUT CARDIOVERSION N/A 04/29/2017   Procedure: TRANSESOPHAGEAL ECHOCARDIOGRAM (TEE) WITH PROPOFOL;  Surgeon: Satira Sark, MD;  [facility];  Service: Cardiovascular;  Laterality: N/A;  . TONSILLECTOMY    . TOTAL HIP ARTHROPLASTY  12/08/2011   Procedure: TOTAL HIP ARTHROPLASTY;  Surgeon: Gearlean Alf, MD;  [facility];  Service:  Orthopedics;  Laterality: Right;  . UPPER GI ENDOSCOPY  12/18/2015   Procedure: UPPER GI ENDOSCOPY;  Surgeon: Ralene Ok, MD;  Location: [facility];  Service: General;;  . WRIST SURGERY Right 55yrs ago  . WRIST SURGERY Left     Social History   Socioeconomic History  . Marital status: Married    Spouse name: Not on file  . Number of children: Not on file  . Years of education: Not on file  . Highest education level: Not on file  Occupational History  . Occupation: retired    Comment: [employer]  Social Needs  . Financial resource strain: Not on file  . Food insecurity:    Worry: Not on file    Inability: Not on file  . Transportation needs:    Medical: Not on file  Non-medical: Not on file  Tobacco Use  . Smoking status: Former Smoker    Packs/day: 1.50    Years: 25.00    Pack years: 37.50    Types: Cigarettes    Start date: 08/08/1960    Last attempt to quit: 06/02/1982    Years since quitting: 35.2  . Smokeless tobacco: Never Used  . Tobacco comment: quit smoking 30+yrs ago  Substance and Sexual Activity  . Alcohol use: No    Alcohol/week: 0.0 oz    Comment: occasionally   . Drug use: No  . Sexual activity: Yes  Lifestyle  . Physical activity:    Days per week: Not on file    Minutes per session: Not on file  . Stress: Not on file  Relationships  . Social connections:    Talks on phone: Not on file    Gets together: Not on file    Attends religious service: Not on file    Active member of club or organization: Not on file    Attends meetings of clubs or organizations: Not on file    Relationship status: Not on file  . Intimate partner violence:    Fear of current or ex partner: Not on file    Emotionally abused: Not on file    Physically abused: Not on file    Forced sexual activity: Not on file  Other Topics Concern  . Not on file  Social History Narrative  . Not on file     Vitals:   08/25/17 1351  BP: (!) 118/58  Pulse: (!) 57    SpO2: 94%  Weight: 246 lb (111.6 kg)  Height: 5\' 10"  (1.778 m)    Wt Readings from Last 3 Encounters:  08/25/17 246 lb (111.6 kg)  08/25/17 246 lb 12.8 oz (111.9 kg)  08/13/17 240 lb (108.9 kg)     PHYSICAL EXAM General: NAD HEENT: Normal. Neck: No JVD, no thyromegaly. Lungs: Clear to auscultation bilaterally with normal respiratory effort. CV: Regular rate and rhythm, normal S1/S2, no S3/S4, no murmur. No pretibial or periankle edema.   Abdomen: Soft, nontender, no distention.  Neurologic: Alert and oriented.  Psych: Normal affect. Skin: Normal. Musculoskeletal: No gross deformities.    ECG: Most recent ECG reviewed.   Labs: Lab Results  Component Value Date/Time   K 4.7 08/10/2017 09:59 AM   BUN 21 (H) 08/10/2017 09:59 AM   BUN 22 07/13/2017 03:41 PM   CREATININE 1.49 (H) 08/10/2017 09:59 AM   CREATININE 1.27 07/17/2014 12:17 PM   ALT 16 (L) 06/15/2017 02:30 PM   TSH 3.705 06/15/2017 02:30 PM   TSH 2.948 05/26/2014 09:50 AM   HGB 9.0 (L) 08/10/2017 09:59 AM   HGB 10.0 (L) 07/13/2017 03:41 PM     Lipids: Lab Results  Component Value Date/Time   LDLCALC 75 03/22/2015 08:16 AM   CHOL 134 03/22/2015 08:16 AM   TRIG 92 03/22/2015 08:16 AM   HDL 41 03/22/2015 08:16 AM       ASSESSMENT AND PLAN:  1. Coronary artery disease with a history of CABG: Currently on Lipitor, metoprolol, and lisinopril.Symptomatically stable from this standpoint.  He is not on aspirin as he is on Xarelto.  He is also anemic.  2. Atrial fibrillation and flutter status post ablation in February 2019 and subsequent cardioversion: Symptomatically stable.  Currently on metoprolol 25 mg twice daily. Anticoagulated with Xarelto. Given the need for cardioversion after ablation, I will continue amiodarone 200 mg daily for the  time being.  3. Hypertension: Blood pressure is normal.Changes to therapy.  4. Hyperlipidemia: Continue Lipitor 40 mg. LDL 84 on 12/08/16.  5. Carotid  artery stenosis status post left CEA: Carotid Dopplers 03/31/17 showed near complete occlusion of the right common carotid and right internal carotid arteries with patent left carotid endarterectomy with 1-39% stenosis and evidence of hyperplasia. Continue statin.He is on Xarelto for atrial fibrillation.  6. Chronic systolic heart failure: Symptomatically stable. Continue Lasix along with metoprolol and lisinopril.  7.  Anemia: This may be related to iron malabsorption.  Hemoglobin is most recently 9 and had been 13.4 on 04/28/17.  He saw gastroenterology this morning and a colonoscopy is being scheduled.  He is to start Flintstones vitamins with supplemental iron today.  8.  Mediastinal adenopathy: This was seen on a CT on 07/23/17.  He is undergoing a repeat CT in May.    Disposition: Follow up 3 months.  Time spent: 40 minutes, of which greater than 50% was spent reviewing symptoms, relevant blood tests and studies, and discussing management plan with the patient.    Kate Sable, [name], F.A.C.C.

## 2017-08-25 NOTE — Telephone Encounter (Signed)
Since patient is not actively bleeding will hold off EGD and colonoscopy until 3 months. He should continue with p.o. iron as recommended Will check H&H in 2 weeks.

## 2017-08-25 NOTE — Patient Instructions (Signed)
Call or report to the emergency room if you have frank rectal bleeding or tarry stools. Esophagogastroduodenoscopy and colonoscopy to be scheduled in about 3 weeks. Will need to stop iron pills 1 week before procedure.

## 2017-08-25 NOTE — Telephone Encounter (Signed)
Patient's wife, Catalina Lunger called -- she states Dr Bronson Ing doesn't feel it will be safe for Ihan to stop Xarelto 2 days prior to TCS/EGD until 3 mths after ablation which would be 10/25/17 -- please advise what to do

## 2017-08-27 ENCOUNTER — Other Ambulatory Visit (INDEPENDENT_AMBULATORY_CARE_PROVIDER_SITE_OTHER): Payer: Self-pay | Admitting: *Deleted

## 2017-08-27 ENCOUNTER — Encounter (INDEPENDENT_AMBULATORY_CARE_PROVIDER_SITE_OTHER): Payer: Self-pay | Admitting: *Deleted

## 2017-08-27 DIAGNOSIS — D509 Iron deficiency anemia, unspecified: Secondary | ICD-10-CM

## 2017-08-27 NOTE — Telephone Encounter (Signed)
H&H noted for April and May and the patient is aware. A letter will be sent as a reminder to Mr.Spoon.

## 2017-08-28 ENCOUNTER — Telehealth: Payer: Self-pay | Admitting: Internal Medicine

## 2017-08-28 DIAGNOSIS — G4733 Obstructive sleep apnea (adult) (pediatric): Secondary | ICD-10-CM

## 2017-08-28 NOTE — Telephone Encounter (Signed)
Spoke with patient. He is aware of CY's recs. Will go ahead and place order for [facility].   Nothing else needed at time of call.

## 2017-08-28 NOTE — Telephone Encounter (Signed)
Called and spoke with patient. He states that he is having moments where he gasps and has trouble getting his breath. He is not getting good rest and he is also not comfortable.   CY please advise he is wanting to know what he can do.

## 2017-08-28 NOTE — Telephone Encounter (Signed)
He will be eligible for a new machine from [name]) in May  Until then, please orde [facility] to reduce his CPAP from 11 to 10  Please encourage him to sleep with head and chest propped up a little- maybe extra pillow, using a wedge, or in a recliner to see if this helps.

## 2017-09-02 ENCOUNTER — Telehealth (INDEPENDENT_AMBULATORY_CARE_PROVIDER_SITE_OTHER): Payer: Self-pay | Admitting: *Deleted

## 2017-09-02 NOTE — Telephone Encounter (Signed)
Patient's wife called for Caleb Wolf, with two questions regarding color of stool. Please advise [phone]

## 2017-09-03 NOTE — Telephone Encounter (Signed)
Patient's wife wanted to make sure that he would be getting his lab work done at [facility]. His order had been faxed there. She mentioned that on yesterday that his stools were black. Her question was would the Iron make his stools black as he has been on it for 2 weeks now. She was advised that this could make stools black , if she would like , she could come to office and pick up stool card so that we may check for blood. She feels that it is the Iron.

## 2017-09-08 DIAGNOSIS — D509 Iron deficiency anemia, unspecified: Secondary | ICD-10-CM | POA: Diagnosis not present

## 2017-09-08 DIAGNOSIS — Z79899 Other long term (current) drug therapy: Secondary | ICD-10-CM | POA: Diagnosis not present

## 2017-09-08 DIAGNOSIS — E785 Hyperlipidemia, unspecified: Secondary | ICD-10-CM | POA: Diagnosis not present

## 2017-09-08 DIAGNOSIS — M1990 Unspecified osteoarthritis, unspecified site: Secondary | ICD-10-CM | POA: Diagnosis not present

## 2017-09-08 DIAGNOSIS — I10 Essential (primary) hypertension: Secondary | ICD-10-CM | POA: Diagnosis not present

## 2017-09-08 DIAGNOSIS — I4891 Unspecified atrial fibrillation: Secondary | ICD-10-CM | POA: Diagnosis not present

## 2017-09-09 ENCOUNTER — Other Ambulatory Visit: Payer: Self-pay | Admitting: Cardiovascular Disease

## 2017-09-09 LAB — HEMOGLOBIN AND HEMATOCRIT, BLOOD
HEMATOCRIT: 34.4 % — AB (ref 37.5–51.0)
HEMOGLOBIN: 10.3 g/dL — AB (ref 13.0–17.7)

## 2017-09-11 ENCOUNTER — Other Ambulatory Visit (INDEPENDENT_AMBULATORY_CARE_PROVIDER_SITE_OTHER): Payer: Self-pay | Admitting: *Deleted

## 2017-09-11 DIAGNOSIS — D649 Anemia, unspecified: Secondary | ICD-10-CM

## 2017-09-14 DIAGNOSIS — I5022 Chronic systolic (congestive) heart failure: Secondary | ICD-10-CM | POA: Diagnosis not present

## 2017-09-14 DIAGNOSIS — I480 Paroxysmal atrial fibrillation: Secondary | ICD-10-CM | POA: Diagnosis not present

## 2017-09-14 DIAGNOSIS — D509 Iron deficiency anemia, unspecified: Secondary | ICD-10-CM | POA: Diagnosis not present

## 2017-09-22 NOTE — Addendum Note (Signed)
Encounter addended by: Milton Ferguson L on: 09/22/2017 4:07 PM  Actions taken: Imaging Exam begun

## 2017-09-22 NOTE — Addendum Note (Signed)
Encounter addended by: Milton Ferguson L on: 09/22/2017 4:08 PM  Actions taken: Imaging Exam ended

## 2017-09-24 ENCOUNTER — Other Ambulatory Visit (INDEPENDENT_AMBULATORY_CARE_PROVIDER_SITE_OTHER): Payer: Self-pay | Admitting: *Deleted

## 2017-09-24 ENCOUNTER — Encounter (INDEPENDENT_AMBULATORY_CARE_PROVIDER_SITE_OTHER): Payer: Self-pay | Admitting: *Deleted

## 2017-09-24 DIAGNOSIS — D509 Iron deficiency anemia, unspecified: Secondary | ICD-10-CM

## 2017-09-24 DIAGNOSIS — D649 Anemia, unspecified: Secondary | ICD-10-CM

## 2017-10-07 DIAGNOSIS — M1712 Unilateral primary osteoarthritis, left knee: Secondary | ICD-10-CM | POA: Diagnosis not present

## 2017-10-07 DIAGNOSIS — M25462 Effusion, left knee: Secondary | ICD-10-CM | POA: Diagnosis not present

## 2017-10-12 DIAGNOSIS — D509 Iron deficiency anemia, unspecified: Secondary | ICD-10-CM | POA: Diagnosis not present

## 2017-10-13 LAB — HEMOGLOBIN AND HEMATOCRIT, BLOOD
HEMOGLOBIN: 11 g/dL — AB (ref 13.0–17.7)
Hematocrit: 35.4 % — ABNORMAL LOW (ref 37.5–51.0)

## 2017-10-14 ENCOUNTER — Other Ambulatory Visit (INDEPENDENT_AMBULATORY_CARE_PROVIDER_SITE_OTHER): Payer: Self-pay | Admitting: *Deleted

## 2017-10-14 DIAGNOSIS — D509 Iron deficiency anemia, unspecified: Secondary | ICD-10-CM

## 2017-10-16 ENCOUNTER — Telehealth: Payer: Self-pay | Admitting: Cardiovascular Disease

## 2017-10-16 ENCOUNTER — Other Ambulatory Visit: Payer: Self-pay | Admitting: Cardiovascular Disease

## 2017-10-16 MED ORDER — POTASSIUM CHLORIDE CRYS ER 20 MEQ PO TBCR: EXTENDED_RELEASE_TABLET | ORAL | 3 refills | Status: DC

## 2017-10-16 MED ORDER — FUROSEMIDE 40 MG PO TABS: 40.0000 mg | ORAL_TABLET | Freq: Every day | ORAL | 3 refills | Status: DC

## 2017-10-16 NOTE — Telephone Encounter (Signed)
Needs refill on Klor-Con to [facility]. Also needs refill on Furosemide sent to [facility]. / tg

## 2017-10-16 NOTE — Telephone Encounter (Signed)
Needs 90 day supply on both of these/tg

## 2017-10-16 NOTE — Telephone Encounter (Signed)
Error

## 2017-10-28 ENCOUNTER — Encounter: Payer: Self-pay | Admitting: Internal Medicine

## 2017-10-28 ENCOUNTER — Ambulatory Visit (INDEPENDENT_AMBULATORY_CARE_PROVIDER_SITE_OTHER): Payer: Medicare Other | Admitting: Internal Medicine

## 2017-10-28 ENCOUNTER — Ambulatory Visit (INDEPENDENT_AMBULATORY_CARE_PROVIDER_SITE_OTHER)
Admission: RE | Admit: 2017-10-28 | Discharge: 2017-10-28 | Disposition: A | Discharge status: Home / Self Care | Payer: Medicare Other | Source: Ambulatory Visit | Attending: Internal Medicine | Admitting: Internal Medicine

## 2017-10-28 VITALS — BP 124/64 | HR 55 | Ht 70.0 in | Wt 236.0 lb

## 2017-10-28 DIAGNOSIS — R0602 Shortness of breath: Secondary | ICD-10-CM | POA: Diagnosis not present

## 2017-10-28 DIAGNOSIS — G4733 Obstructive sleep apnea (adult) (pediatric): Secondary | ICD-10-CM | POA: Diagnosis not present

## 2017-10-28 DIAGNOSIS — R5383 Other fatigue: Secondary | ICD-10-CM

## 2017-10-28 DIAGNOSIS — I4819 Other persistent atrial fibrillation: Secondary | ICD-10-CM

## 2017-10-28 DIAGNOSIS — R599 Enlarged lymph nodes, unspecified: Secondary | ICD-10-CM | POA: Diagnosis not present

## 2017-10-28 DIAGNOSIS — R590 Localized enlarged lymph nodes: Secondary | ICD-10-CM

## 2017-10-28 DIAGNOSIS — I5022 Chronic systolic (congestive) heart failure: Secondary | ICD-10-CM

## 2017-10-28 DIAGNOSIS — I6529 Occlusion and stenosis of unspecified carotid artery: Secondary | ICD-10-CM

## 2017-10-28 DIAGNOSIS — I481 Persistent atrial fibrillation: Secondary | ICD-10-CM | POA: Diagnosis not present

## 2017-10-28 DIAGNOSIS — Z79899 Other long term (current) drug therapy: Secondary | ICD-10-CM | POA: Diagnosis not present

## 2017-10-28 DIAGNOSIS — D649 Anemia, unspecified: Secondary | ICD-10-CM | POA: Diagnosis not present

## 2017-10-28 MED ORDER — METOPROLOL TARTRATE 25 MG PO TABS: 12.5000 mg | ORAL_TABLET | Freq: Two times a day (BID) | ORAL | 2 refills | Status: DC

## 2017-10-28 NOTE — Patient Instructions (Signed)
Medication Instructions:  Your physician has recommended you make the following change in your medication:  1. DECREASE Metoprolol to 12.5 mg twice daily  Labwork: Today: BMET, CBC, TSH, LFTs and BNP  Testing/Procedures: None ordered  Follow-Up: Your physician recommends that you schedule a follow-up appointment in: 2 months with [name].  * If you need a refill on your cardiac medications before your next appointment, please call your pharmacy.   *Please note that any paperwork needing to be filled out by the provider will need to be addressed at the front desk prior to seeing the provider. Please note that any FMLA, disability or other documents regarding health condition is subject to a $25.00 charge that must be received prior to completion of paperwork in the form of a money order or check.  Thank you for choosing [facility]!!    Any Other Special Instructions Will Be Listed Below (If Applicable).

## 2017-10-28 NOTE — Progress Notes (Signed)
PCP: Asencion Noble, MD Primary Cardiologist: Dr Gavin Pound is a 77 y.o. male who presents today for routine electrophysiology followup.  Since his recent afib ablation, the patient reports doing reasonably well.  He has struggled with anemia.  He has GI workup planned.  He reports fatigue and SOB with mild to moderate activity.   Unaware of any afib.  Edema is stable.  Today, he denies symptoms of palpitations, chest pain,  dizziness, presyncope, or syncope.  The patient is otherwise without complaint today.   Past Medical History:  Diagnosis Date  . Arthritis   . Basal cell carcinoma   . Carotid stenosis   . Coronary artery disease    Multvessel s/p CABG 2015  . Enlarged prostate   . Essential hypertension   . GERD (gastroesophageal reflux disease)   . Gout   . History of colon polyps   . History of kidney stones   . Hyperlipidemia   . Lumbar disc disease   . Persistent atrial fibrillation ([place])   . Sleep apnea    CPAP   Past Surgical History:  Procedure Laterality Date  . ABLATION OF DYSRHYTHMIC FOCUS  07/28/2017  . ATRIAL FIBRILLATION ABLATION N/A 07/28/2017   Procedure: ATRIAL FIBRILLATION ABLATION;  Surgeon: Thompson Grayer, MD;  Location: [place] [facility];  Service: Cardiovascular;  Laterality: N/A;  . BACK SURGERY    . CARDIAC CATHETERIZATION  05/22/2014   Procedure: IABP INSERTION;  Surgeon: Leonie Man, MD;  Location: [facility] CATH LAB;  Service: Cardiovascular;;  . CARDIOVERSION N/A 04/29/2017   Procedure: CARDIOVERSION;  Surgeon: Satira Sark, MD;  [facility]: [facility];  Service: Cardiovascular;  Laterality: N/A;  . CARDIOVERSION N/A 08/13/2017   Procedure: CARDIOVERSION;  Surgeon: Pixie Casino, MD;  Location: [facility] ENDOSCOPY;  Service: Cardiovascular;  Laterality: N/A;  . Cataract surgery Right   . COLONOSCOPY    . CORONARY ARTERY BYPASS GRAFT N/A 05/22/2014   Procedure: CORONARY ARTERY BYPASS GRAFTING (CABG) times three using left internal  mammary and right saphenous vein.;  Surgeon: Melrose Nakayama, MD;  Location: [place];  Service: Open Heart Surgery;  Laterality: N/A;  . ENDARTERECTOMY Left 02/17/2014   Procedure: ENDARTERECTOMY CAROTID WITH PATCH ANGIOPLASTY;  Surgeon: Mal Misty, MD;  Location: [place];  Service: Vascular;  Laterality: Left;  . ESOPHAGEAL DILATION N/A 08/22/2015   Procedure: ESOPHAGEAL DILATION;  Surgeon: Rogene Houston, MD;  [facility];  Service: Endoscopy;  Laterality: N/A;  . ESOPHAGOGASTRODUODENOSCOPY N/A 08/22/2015   Procedure: ESOPHAGOGASTRODUODENOSCOPY (EGD);  Surgeon: Rogene Houston, MD;  [facility];  Service: Endoscopy;  Laterality: N/A;  12:45 - moved to 1:55 - Ann notified pt  . EYE SURGERY     cataract extraction (right) with repair macular tear , with IOL     right  . FRACTURE SURGERY     bilateral wrist fractures- one ORIF  . JOINT REPLACEMENT  2011   left knee  . LEFT HEART CATHETERIZATION WITH CORONARY ANGIOGRAM N/A 05/22/2014   Procedure: LEFT HEART CATHETERIZATION WITH CORONARY ANGIOGRAM;  Surgeon: Leonie Man, MD;  Location: [facility] CATH LAB;  Service: Cardiovascular;  Laterality: N/A;  . LITHOTRIPSY    . PARS PLANA VITRECTOMY W/ REPAIR OF MACULAR HOLE    . RHINOPLASTY    . TEE WITHOUT CARDIOVERSION N/A 04/29/2017   Procedure: TRANSESOPHAGEAL ECHOCARDIOGRAM (TEE) WITH PROPOFOL;  Surgeon: Satira Sark, MD;  [facility];  Service: Cardiovascular;  Laterality: N/A;  . TONSILLECTOMY    . TOTAL HIP ARTHROPLASTY  12/08/2011   Procedure: TOTAL HIP ARTHROPLASTY;  Surgeon: Gearlean Alf, MD;  Location: [facility] OR[facility];  Service: Orthopedics;  Laterality: Right;  . UPPER GI ENDOSCOPY  12/18/2015   Procedure: UPPER GI ENDOSCOPY;  Surgeon: Ralene Ok, MD;  Location: [facility] ORS;  Service: General;;  . WRIST SURGERY Right 54yrs ago  . WRIST SURGERY Left     ROS- all systems are personally reviewed and negatives except as per HPI above  Current Outpatient  Medications  Medication Sig Dispense Refill  . amiodarone (PACERONE) 200 MG tablet Take 1 tablet (200 mg total) by mouth daily. 90 tablet 3  . atorvastatin (LIPITOR) 40 MG tablet TAKE 1 TABLET BY MOUTH IN THE EVENING (Patient taking differently: TAKE 40 MG BY MOUTH IN THE EVENING) 90 tablet 2  . colchicine 0.6 MG tablet Take 0.6 mg by mouth daily as needed (for gout).     . cyclobenzaprine (FLEXERIL) 10 MG tablet Take 10 mg by mouth at bedtime as needed for muscle spasms  1  . doxazosin (CARDURA) 4 MG tablet Take 4 mg by mouth daily.  4  . finasteride (PROSCAR) 5 MG tablet Take 5 mg by mouth every evening.     . furosemide (LASIX) 40 MG tablet Take 1 tablet (40 mg total) by mouth daily. 90 tablet 3  . lisinopril (PRINIVIL,ZESTRIL) 40 MG tablet Take 40 mg by mouth daily.    . metoprolol tartrate (LOPRESSOR) 25 MG tablet Take 1 tablet (25 mg total) by mouth 2 (two) times daily. 180 tablet 3  . nitroGLYCERIN (NITROSTAT) 0.4 MG SL tablet PLACE 1 TABLET UNDER THE TONGUE EVERY 5 MINUTES AS NEEDED FOR CHEST PAIN (Patient taking differently: Place 0.4 mg under the tongue every 5 (five) minutes as needed for chest pain. ) 25 tablet 3  . pantoprazole (PROTONIX) 40 MG tablet Take 1 tablet (40 mg total) by mouth daily before breakfast. (Patient taking differently: Take 40 mg by mouth every evening. ) 90 tablet 3  . Pediatric Multivitamins-Iron (FLINTSTONES PLUS IRON) chewable tablet Chew 1 tablet by mouth 2 (two) times daily.    . potassium chloride SA (KLOR-CON M20) 20 MEQ tablet TAKE 1 TABLET BY MOUTH 2 TIMES A WEEK 25 tablet 3  . rivaroxaban (XARELTO) 20 MG TABS tablet Take 1 tablet (20 mg total) by mouth daily. 30 tablet 11   No current facility-administered medications for this visit.     Physical Exam: Vitals:   10/28/17 1401  BP: 124/64  Pulse: (!) 55  Weight: 236 lb (107 kg)  Height: 5\' 10"  (1.778 m)    GEN- The patient is well appearing, alert and oriented x 3 today.   Head- normocephalic,  atraumatic Eyes-  Sclera clear, conjunctiva pink Ears- hearing intact Oropharynx- clear Lungs- Clear to ausculation bilaterally, normal work of breathing Heart- Regular rate and rhythm, no murmurs, rubs or gallops, PMI not laterally displaced GI- soft, NT, ND, + BS Extremities- no clubbing, cyanosis, or edema  EKG tracing ordered today is personally reviewed and shows sinus rhythm 55 bpm, PR 180 msec, QRS 80 msec, QTc 484 msec  Assessment and Plan:  1. Persistent atrial fibrillation Doing well s/p ablation He did have some ERAF but has done better lately Reduce amiodarone to 100mg  daily upon return chads2vasc score is at least 4.  Continue on xarelto  2. Obesity Body mass index is 33.86 kg/m. Wt Readings from Last 3 Encounters:  10/28/17 236 lb (107 kg)  08/25/17 246 lb (111.6 kg)  08/25/17 246 lb 12.8 oz (111.9 kg)    he has lost 10 lbs!  3. OSA  Compliance with CPAP encouraged  4. Fatigue/ SOB/ anemia Will order labs today Agree with GI workup Repeat chest CT to evaluate lymphedenopathy was performed today with results pending Reduce metoprolol to 12.5mg  BID today  5. Chronic systolic dysfunction Stable.  Followed by Dr Bronson Ing  Follow-up with Dr Bronson Ing as scheduled Return to see me in 2 months  Thompson Grayer MD, [facility] 10/28/2017 2:19 PM

## 2017-10-29 LAB — CBC
Hematocrit: 35.5 % — ABNORMAL LOW (ref 37.5–51.0)
Hemoglobin: 11.1 g/dL — ABNORMAL LOW (ref 13.0–17.7)
MCH: 28.1 pg (ref 26.6–33.0)
MCHC: 31.3 g/dL — ABNORMAL LOW (ref 31.5–35.7)
MCV: 90 fL (ref 79–97)
PLATELETS: 128 10*3/uL — AB (ref 150–450)
RBC: 3.95 x10E6/uL — AB (ref 4.14–5.80)
RDW: 18.6 % — ABNORMAL HIGH (ref 12.3–15.4)
WBC: 2.2 10*3/uL — CL (ref 3.4–10.8)

## 2017-10-29 LAB — BASIC METABOLIC PANEL
BUN/Creatinine Ratio: 16 (ref 10–24)
BUN: 21 mg/dL (ref 8–27)
CALCIUM: 8.7 mg/dL (ref 8.6–10.2)
CO2: 23 mmol/L (ref 20–29)
Chloride: 104 mmol/L (ref 96–106)
Creatinine, Ser: 1.33 mg/dL — ABNORMAL HIGH (ref 0.76–1.27)
GFR, EST AFRICAN AMERICAN: 60 mL/min/{1.73_m2} (ref >59)
GFR, EST NON AFRICAN AMERICAN: 52 mL/min/{1.73_m2} — AB (ref >59)
Glucose: 88 mg/dL (ref 65–99)
POTASSIUM: 4.3 mmol/L (ref 3.5–5.2)
SODIUM: 143 mmol/L (ref 134–144)

## 2017-10-29 LAB — HEPATIC FUNCTION PANEL
ALT: 29 IU/L (ref 0–44)
AST: 26 IU/L (ref 0–40)
Albumin: 4.3 g/dL (ref 3.5–4.8)
Alkaline Phosphatase: 91 IU/L (ref 39–117)
BILIRUBIN TOTAL: 0.4 mg/dL (ref 0.0–1.2)
Bilirubin, Direct: 0.17 mg/dL (ref 0.00–0.40)
Total Protein: 6.7 g/dL (ref 6.0–8.5)

## 2017-10-29 LAB — PRO B NATRIURETIC PEPTIDE: NT-PRO BNP: 3728 pg/mL — AB (ref 0–486)

## 2017-10-29 LAB — TSH: TSH: 3.07 u[IU]/mL (ref 0.450–4.500)

## 2017-10-30 ENCOUNTER — Telehealth: Payer: Self-pay

## 2017-10-30 DIAGNOSIS — I509 Heart failure, unspecified: Secondary | ICD-10-CM | POA: Diagnosis not present

## 2017-10-30 DIAGNOSIS — J948 Other specified pleural conditions: Secondary | ICD-10-CM

## 2017-10-30 DIAGNOSIS — J918 Pleural effusion in other conditions classified elsewhere: Secondary | ICD-10-CM | POA: Diagnosis not present

## 2017-10-30 DIAGNOSIS — D61810 Antineoplastic chemotherapy induced pancytopenia: Secondary | ICD-10-CM | POA: Diagnosis not present

## 2017-10-30 NOTE — Telephone Encounter (Signed)
Scheduled thoracentesis for Pt at [facility] on Monday November 02, 2017 at 11:00 am.  Pt advised to arrive at 10:45 am. Pt has appt with Dr. Willey Blade today to review lab work. Will continue to monitor.

## 2017-10-30 NOTE — Telephone Encounter (Signed)
Thompson Grayer, MD sent to Damian Leavell, RN; Deneise Lever, MD        Thanks!  We will plan to arrange. If we run into difficulty, we will reach out!  Anderson Malta, lets work on this on Friday.   Jeneen Rinks   Previous Messages    ----- Message -----  From: Deneise Lever, MD  Sent: 10/29/2017  8:55 AM  To: Thompson Grayer, MD   Presume this is transudative effusion.  Recommend IR do thoracentesis to tap dry, with samples sent for cell count and diff, cytology and cell block, routine fluid culture and sensitivity, and parallel fluid and blood total protein and LDH.   Your office can arrange this, or If you would prefer, we can get this done   - Clint Young    ----- Message -----  From: Thompson Grayer, MD  Sent: 10/28/2017 10:12 PM  To: Asencion Noble, MD, Herminio Commons, MD, *   Results reviewed. Sonia Baller, please inform pt of result.  I will route to primary care also.  Lymphadenopathy appears to be improved.  He has an enlarging R pleural effusion of unclear etiology.  He may benefit from having this drained. He has previously seen Dr Gwenette Greet. I have included him on this note to see if he has a preference for how we proceed with evaluation of the effusion.

## 2017-11-02 ENCOUNTER — Ambulatory Visit (HOSPITAL_COMMUNITY)
Admission: RE | Admit: 2017-11-02 | Discharge: 2017-11-02 | Disposition: A | Discharge status: Home / Self Care | Payer: Medicare Other | Source: Ambulatory Visit | Attending: Internal Medicine | Admitting: Internal Medicine

## 2017-11-02 ENCOUNTER — Ambulatory Visit (HOSPITAL_COMMUNITY)
Admission: RE | Admit: 2017-11-02 | Discharge: 2017-11-02 | Disposition: A | Discharge status: Home / Self Care | Payer: Medicare Other | Source: Ambulatory Visit | Attending: Diagnostic Radiology | Admitting: Diagnostic Radiology

## 2017-11-02 ENCOUNTER — Encounter (HOSPITAL_COMMUNITY): Payer: Self-pay

## 2017-11-02 DIAGNOSIS — J90 Pleural effusion, not elsewhere classified: Secondary | ICD-10-CM | POA: Diagnosis not present

## 2017-11-02 DIAGNOSIS — R846 Abnormal cytological findings in specimens from respiratory organs and thorax: Secondary | ICD-10-CM | POA: Diagnosis not present

## 2017-11-02 DIAGNOSIS — J948 Other specified pleural conditions: Secondary | ICD-10-CM

## 2017-11-02 DIAGNOSIS — Z9889 Other specified postprocedural states: Secondary | ICD-10-CM | POA: Diagnosis not present

## 2017-11-02 DIAGNOSIS — Z951 Presence of aortocoronary bypass graft: Secondary | ICD-10-CM | POA: Insufficient documentation

## 2017-11-02 LAB — BODY FLUID CELL COUNT WITH DIFFERENTIAL
EOS FL: 1 %
Lymphs, Fluid: 67 %
Monocyte-Macrophage-Serous Fluid: 31 % — ABNORMAL LOW (ref 50–90)
NEUTROPHIL FLUID: 1 % (ref 0–25)
WBC FLUID: 610 uL (ref 0–1000)

## 2017-11-02 LAB — GRAM STAIN

## 2017-11-02 LAB — PROTEIN, PLEURAL OR PERITONEAL FLUID: Total protein, fluid: 4.3 g/dL

## 2017-11-02 NOTE — Discharge Instructions (Signed)
Thoracentesis A thoracentesis is a procedure to remove fluid that has built up in the space between the linings of the chest wall and the lungs (pleural space). It is normal to have a small amount of fluid in the pleural space. Some medical conditions, such as heart failure, pneumonia, kidney problems, or cancer, can create too much fluid. This extra fluid is removed using a needle that is inserted through the skin and tissue and into the pleural space. A thoracentesis may be done to:  Understand why there is extra fluid in the pleural space and create a treatment plan that is right for you.  Help to get rid of shortness of breath, discomfort, or pain that is caused by the extra fluid.  Tell a health care provider about:  Any allergies you have.  All medicines you are taking, including vitamins, herbs, eye drops, creams, and over-the-counter medicines. This includes any use of steroids, either by mouth or in a cream.  Any problems you or family members have had with anesthetic medicines.  Any blood disorders you have, including any history of blood clots.  Any surgeries you have had.  Medical conditions you have, including: ? The possibility of pregnancy, if this applies. ? Have a frequent cough or coughing episodes. What are the risks? Generally, this is a safe procedure. However, problems may occur, including:  Infection.  Injury to the lung.  Lung collapse.  Bleeding.  What happens before the procedure?  You may have a chest X-ray or another imaging test, such as a CT scan or ultrasound, to determine the location and amount of fluid in your pleural space.  Ask your health care provider about: ? Changing or stopping your regular medicines. This is especially important if you are taking diabetes medicines or blood thinners. ? Taking medicines such as aspirin and ibuprofen. These medicines can thin your blood. Do not take these medicines before your procedure if your health  care provider instructs you not to. ? Taking a cough suppressant if you have a frequent cough or coughing episodes.  Plan to have someone take you home after the procedure. What happens during the procedure?  You will be asked to sit upright and lean slightly forward for the procedure.  An area of your back will be cleaned with a germ-killing solution (antiseptic).  You will be given a medicine that numbs the area (local anesthetic).  A needle will be inserted between your ribs and into the pleural space. You may feel pressure or slight pain as the needle is positioned into the pleural space.  Fluid will be removed from the pleural space through the needle. You may feel pressure as the fluid is removed.  The needle will be taken out after the excess fluid has been removed. A sample of the fluid may be sent to be examined.  The needle insertion site (puncture site) will be covered with a bandage (dressing). The procedure may vary among health care providers and hospitals. What happens after the procedure?  A chest X-ray may be done to check the amount of fluid that remains in your pleural space.  Your blood pressure, heart rate, breathing rate, and blood oxygen level will be monitored often until the medicines you were given have worn off.  It is your responsibility to obtain your test results. Ask the lab or department performing the test when and how you will get your results. Talk with your health care provider if you have any questions about your  results. This information is not intended to replace advice given to you by your health care provider. Make sure you discuss any questions you have with your health care provider. Document Released: 12/02/2004 Document Revised: 01/19/2016 Document Reviewed: 02/28/2014 Elsevier Interactive Patient Education  [employer].

## 2017-11-02 NOTE — Progress Notes (Signed)
Thoracentesis complete no signs of distress.  

## 2017-11-02 NOTE — Procedures (Signed)
PreOperative Dx: RIGHT pleural effusion, transudate Postoperative Dx: RIGHT pleural effusion, transudate Procedure:   US guided RIGHT thoracentesis Radiologist:  Thornton Papas Anesthesia:  10 ml of 1% lidocaine Specimen:  1.2 L of clear yellow colored fluid EBL:   < 1 ml Complications: none

## 2017-11-03 LAB — TRIGLYCERIDES, BODY FLUIDS: Triglycerides, Fluid: 18 mg/dL

## 2017-11-04 DIAGNOSIS — D508 Other iron deficiency anemias: Secondary | ICD-10-CM | POA: Diagnosis not present

## 2017-11-04 DIAGNOSIS — I5022 Chronic systolic (congestive) heart failure: Secondary | ICD-10-CM | POA: Diagnosis not present

## 2017-11-04 DIAGNOSIS — I480 Paroxysmal atrial fibrillation: Secondary | ICD-10-CM | POA: Diagnosis not present

## 2017-11-04 DIAGNOSIS — Z79899 Other long term (current) drug therapy: Secondary | ICD-10-CM | POA: Diagnosis not present

## 2017-11-06 DIAGNOSIS — I480 Paroxysmal atrial fibrillation: Secondary | ICD-10-CM | POA: Diagnosis not present

## 2017-11-06 DIAGNOSIS — I509 Heart failure, unspecified: Secondary | ICD-10-CM | POA: Diagnosis not present

## 2017-11-06 DIAGNOSIS — D61810 Antineoplastic chemotherapy induced pancytopenia: Secondary | ICD-10-CM | POA: Diagnosis not present

## 2017-11-07 LAB — CULTURE, BODY FLUID W GRAM STAIN -BOTTLE: Culture: NO GROWTH

## 2017-11-10 ENCOUNTER — Encounter (INDEPENDENT_AMBULATORY_CARE_PROVIDER_SITE_OTHER): Payer: Self-pay | Admitting: *Deleted

## 2017-11-16 DIAGNOSIS — Z87442 Personal history of urinary calculi: Secondary | ICD-10-CM | POA: Diagnosis not present

## 2017-11-16 DIAGNOSIS — N312 Flaccid neuropathic bladder, not elsewhere classified: Secondary | ICD-10-CM | POA: Diagnosis not present

## 2017-11-16 DIAGNOSIS — N3941 Urge incontinence: Secondary | ICD-10-CM | POA: Diagnosis not present

## 2017-11-16 DIAGNOSIS — N401 Enlarged prostate with lower urinary tract symptoms: Secondary | ICD-10-CM | POA: Diagnosis not present

## 2017-11-16 DIAGNOSIS — N529 Male erectile dysfunction, unspecified: Secondary | ICD-10-CM | POA: Diagnosis not present

## 2017-11-16 DIAGNOSIS — E291 Testicular hypofunction: Secondary | ICD-10-CM | POA: Diagnosis not present

## 2017-11-18 DIAGNOSIS — L821 Other seborrheic keratosis: Secondary | ICD-10-CM | POA: Diagnosis not present

## 2017-11-18 DIAGNOSIS — L570 Actinic keratosis: Secondary | ICD-10-CM | POA: Diagnosis not present

## 2017-11-18 DIAGNOSIS — Z85828 Personal history of other malignant neoplasm of skin: Secondary | ICD-10-CM | POA: Diagnosis not present

## 2017-11-19 DIAGNOSIS — Z79899 Other long term (current) drug therapy: Secondary | ICD-10-CM | POA: Diagnosis not present

## 2017-11-19 DIAGNOSIS — I5022 Chronic systolic (congestive) heart failure: Secondary | ICD-10-CM | POA: Diagnosis not present

## 2017-11-20 ENCOUNTER — Other Ambulatory Visit (HOSPITAL_COMMUNITY): Payer: Self-pay | Admitting: Internal Medicine

## 2017-11-20 ENCOUNTER — Ambulatory Visit (HOSPITAL_COMMUNITY)
Admission: RE | Admit: 2017-11-20 | Discharge: 2017-11-20 | Disposition: A | Discharge status: Home / Self Care | Payer: Medicare Other | Source: Ambulatory Visit | Attending: Internal Medicine | Admitting: Internal Medicine

## 2017-11-20 DIAGNOSIS — J918 Pleural effusion in other conditions classified elsewhere: Secondary | ICD-10-CM

## 2017-11-20 DIAGNOSIS — R0602 Shortness of breath: Secondary | ICD-10-CM | POA: Diagnosis not present

## 2017-11-20 DIAGNOSIS — J90 Pleural effusion, not elsewhere classified: Secondary | ICD-10-CM | POA: Diagnosis not present

## 2017-11-20 DIAGNOSIS — I5022 Chronic systolic (congestive) heart failure: Secondary | ICD-10-CM | POA: Diagnosis not present

## 2017-11-20 DIAGNOSIS — N183 Chronic kidney disease, stage 3 (moderate): Secondary | ICD-10-CM | POA: Diagnosis not present

## 2017-11-23 ENCOUNTER — Other Ambulatory Visit (INDEPENDENT_AMBULATORY_CARE_PROVIDER_SITE_OTHER): Payer: Self-pay | Admitting: *Deleted

## 2017-11-23 ENCOUNTER — Encounter (INDEPENDENT_AMBULATORY_CARE_PROVIDER_SITE_OTHER): Payer: Self-pay | Admitting: *Deleted

## 2017-11-23 DIAGNOSIS — D509 Iron deficiency anemia, unspecified: Secondary | ICD-10-CM

## 2017-11-25 ENCOUNTER — Ambulatory Visit (INDEPENDENT_AMBULATORY_CARE_PROVIDER_SITE_OTHER): Payer: Medicare Other | Admitting: Cardiovascular Disease

## 2017-11-25 ENCOUNTER — Telehealth (INDEPENDENT_AMBULATORY_CARE_PROVIDER_SITE_OTHER): Payer: Self-pay | Admitting: *Deleted

## 2017-11-25 ENCOUNTER — Encounter (INDEPENDENT_AMBULATORY_CARE_PROVIDER_SITE_OTHER): Payer: Self-pay | Admitting: *Deleted

## 2017-11-25 ENCOUNTER — Encounter: Payer: Self-pay | Admitting: Cardiovascular Disease

## 2017-11-25 ENCOUNTER — Other Ambulatory Visit (INDEPENDENT_AMBULATORY_CARE_PROVIDER_SITE_OTHER): Payer: Self-pay | Admitting: *Deleted

## 2017-11-25 VITALS — BP 112/56 | HR 41 | Ht 70.0 in | Wt 229.0 lb

## 2017-11-25 DIAGNOSIS — Z1211 Encounter for screening for malignant neoplasm of colon: Secondary | ICD-10-CM | POA: Insufficient documentation

## 2017-11-25 DIAGNOSIS — I4819 Other persistent atrial fibrillation: Secondary | ICD-10-CM

## 2017-11-25 DIAGNOSIS — D649 Anemia, unspecified: Secondary | ICD-10-CM | POA: Insufficient documentation

## 2017-11-25 DIAGNOSIS — I25708 Atherosclerosis of coronary artery bypass graft(s), unspecified, with other forms of angina pectoris: Secondary | ICD-10-CM

## 2017-11-25 DIAGNOSIS — Z9889 Other specified postprocedural states: Secondary | ICD-10-CM

## 2017-11-25 DIAGNOSIS — I5022 Chronic systolic (congestive) heart failure: Secondary | ICD-10-CM | POA: Diagnosis not present

## 2017-11-25 DIAGNOSIS — I10 Essential (primary) hypertension: Secondary | ICD-10-CM | POA: Diagnosis not present

## 2017-11-25 DIAGNOSIS — R001 Bradycardia, unspecified: Secondary | ICD-10-CM | POA: Diagnosis not present

## 2017-11-25 DIAGNOSIS — D508 Other iron deficiency anemias: Secondary | ICD-10-CM

## 2017-11-25 DIAGNOSIS — I6529 Occlusion and stenosis of unspecified carotid artery: Secondary | ICD-10-CM

## 2017-11-25 DIAGNOSIS — I481 Persistent atrial fibrillation: Secondary | ICD-10-CM

## 2017-11-25 DIAGNOSIS — R590 Localized enlarged lymph nodes: Secondary | ICD-10-CM

## 2017-11-25 MED ORDER — PEG 3350-KCL-NA BICARB-NACL 420 G PO SOLR: 4000.0000 mL | Freq: Once | ORAL | 0 refills | Status: AC

## 2017-11-25 NOTE — Telephone Encounter (Signed)
Patient needs trilyte 

## 2017-11-25 NOTE — Progress Notes (Signed)
SUBJECTIVE: The patient presents for routine follow-up.  When he saw [name] on 10/28/2017, metoprolol was reduced to 12.5 mg twice daily.  A chest CT was ordered which demonstrated a decrease in size of mediastinal lymph nodes.  It also showed cardiomegaly with mild interstitial pulmonary edema suggestive of CHF.  There was an enlarging moderate to large right chronic pleural effusion with areas of chronic atelectasis in the dependent portion of the right lung.  He then underwent right-sided thoracentesis with removal of 1.2 L of fluid.  He underwent atrial fibrillation and flutter ablation in February 2019.  He went back in atrial fibrillation in early March and then underwent cardioversion on 08/13/2017. He also has a history of coronary artery disease and CABG along with carotid artery stenosis status post left carotid endarterectomy and chronic systolic heart failure, LVEF 45-50%.  He is doing very well and denies chest pain and palpitations.  His PCP increase the dose of Lasix and leg swelling has markedly improved.  He is wearing a compression stocking on the left leg.  Chronic exertional dyspnea is stable.  He denies palpitations and syncope.  He only gets dizzy if he stands up too quickly or gets out of the car too quickly.  I reviewed the chest x-ray performed on 11/20/2017 which demonstrated minimal right-sided pleural effusion with no edema or consolidation.  Review of Systems: As per "subjective", otherwise negative.  Allergies  Allergen Reactions  . Codeine Sulfate Nausea Only    Current Outpatient Medications  Medication Sig Dispense Refill  . amiodarone (PACERONE) 200 MG tablet Take 1 tablet (200 mg total) by mouth daily. 90 tablet 3  . atorvastatin (LIPITOR) 40 MG tablet TAKE 1 TABLET BY MOUTH IN THE EVENING (Patient taking differently: TAKE 40 MG BY MOUTH IN THE EVENING) 90 tablet 2  . colchicine 0.6 MG tablet Take 0.6 mg by mouth daily as needed (for gout).     .  Cyanocobalamin (B-12) 1000 MCG SUBL Place under the tongue.    . cyclobenzaprine (FLEXERIL) 10 MG tablet Take 10 mg by mouth at bedtime as needed for muscle spasms  1  . doxazosin (CARDURA) 4 MG tablet Take 4 mg by mouth daily.  4  . finasteride (PROSCAR) 5 MG tablet Take 5 mg by mouth every evening.     . furosemide (LASIX) 40 MG tablet Take 40 mg by mouth 2 (two) times daily.    Marland Kitchen lisinopril (PRINIVIL,ZESTRIL) 40 MG tablet Take 40 mg by mouth daily.    . metoprolol tartrate (LOPRESSOR) 25 MG tablet Take 0.5 tablets (12.5 mg total) by mouth 2 (two) times daily. 90 tablet 2  . nitroGLYCERIN (NITROSTAT) 0.4 MG SL tablet PLACE 1 TABLET UNDER THE TONGUE EVERY 5 MINUTES AS NEEDED FOR CHEST PAIN (Patient taking differently: Place 0.4 mg under the tongue every 5 (five) minutes as needed for chest pain. ) 25 tablet 3  . pantoprazole (PROTONIX) 40 MG tablet Take 1 tablet (40 mg total) by mouth daily before breakfast. (Patient taking differently: Take 40 mg by mouth every evening. ) 90 tablet 3  . Pediatric Multivitamins-Iron (FLINTSTONES PLUS IRON) chewable tablet Chew 1 tablet by mouth 2 (two) times daily.    . potassium chloride SA (KLOR-CON M20) 20 MEQ tablet TAKE 1 TABLET BY MOUTH 2 TIMES A WEEK 25 tablet 3  . rivaroxaban (XARELTO) 20 MG TABS tablet Take 1 tablet (20 mg total) by mouth daily. 30 tablet 11   No current  facility-administered medications for this visit.     Past Medical History:  Diagnosis Date  . Arthritis   . Basal cell carcinoma   . Carotid stenosis   . Coronary artery disease    Multvessel s/p CABG 2015  . Enlarged prostate   . Essential hypertension   . GERD (gastroesophageal reflux disease)   . Gout   . History of colon polyps   . History of kidney stones   . Hyperlipidemia   . Lumbar disc disease   . Persistent atrial fibrillation ([place])   . Sleep apnea    CPAP    Past Surgical History:  Procedure Laterality Date  . ABLATION OF DYSRHYTHMIC FOCUS  07/28/2017  .  ATRIAL FIBRILLATION ABLATION N/A 07/28/2017   Procedure: ATRIAL FIBRILLATION ABLATION;  Surgeon: Thompson Grayer, MD;  Location: [place] [facility];  Service: Cardiovascular;  Laterality: N/A;  . BACK SURGERY    . CARDIAC CATHETERIZATION  05/22/2014   Procedure: IABP INSERTION;  Surgeon: Leonie Man, MD;  Location: [facility] CATH LAB;  Service: Cardiovascular;;  . CARDIOVERSION N/A 04/29/2017   Procedure: CARDIOVERSION;  Surgeon: Satira Sark, MD;  [facility];  Service: Cardiovascular;  Laterality: N/A;  . CARDIOVERSION N/A 08/13/2017   Procedure: CARDIOVERSION;  Surgeon: Pixie Casino, MD;  Location: [facility] ENDOSCOPY;  Service: Cardiovascular;  Laterality: N/A;  . Cataract surgery Right   . COLONOSCOPY    . CORONARY ARTERY BYPASS GRAFT N/A 05/22/2014   Procedure: CORONARY ARTERY BYPASS GRAFTING (CABG) times three using left internal mammary and right saphenous vein.;  Surgeon: Melrose Nakayama, MD;  Location: [place];  Service: Open Heart Surgery;  Laterality: N/A;  . ENDARTERECTOMY Left 02/17/2014   Procedure: ENDARTERECTOMY CAROTID WITH PATCH ANGIOPLASTY;  Surgeon: Mal Misty, MD;  Location: [place];  Service: Vascular;  Laterality: Left;  . ESOPHAGEAL DILATION N/A 08/22/2015   Procedure: ESOPHAGEAL DILATION;  Surgeon: Rogene Houston, MD;  [facility];  Service: Endoscopy;  Laterality: N/A;  . ESOPHAGOGASTRODUODENOSCOPY N/A 08/22/2015   Procedure: ESOPHAGOGASTRODUODENOSCOPY (EGD);  Surgeon: Rogene Houston, MD;  [facility];  Service: Endoscopy;  Laterality: N/A;  12:45 - moved to 1:55 - Ann notified pt  . EYE SURGERY     cataract extraction (right) with repair macular tear , with IOL     right  . FRACTURE SURGERY     bilateral wrist fractures- one ORIF  . JOINT REPLACEMENT  2011   left knee  . LEFT HEART CATHETERIZATION WITH CORONARY ANGIOGRAM N/A 05/22/2014   Procedure: LEFT HEART CATHETERIZATION WITH CORONARY ANGIOGRAM;  Surgeon: Leonie Man,  MD;  Location[facility] CATH LAB;  Service: Cardiovascular;  Laterality: N/A;  . LITHOTRIPSY    . PARS PLANA VITRECTOMY W/ REPAIR OF MACULAR HOLE    . RHINOPLASTY    . TEE WITHOUT CARDIOVERSION N/A 04/29/2017   Procedure: TRANSESOPHAGEAL ECHOCARDIOGRAM (TEE) WITH PROPOFOL;  Surgeon: Satira Sark, MD;  [facility];  Service: Cardiovascular;  Laterality: N/A;  . TONSILLECTOMY    . TOTAL HIP ARTHROPLASTY  12/08/2011   Procedure: TOTAL HIP ARTHROPLASTY;  Surgeon: Gearlean Alf, MD;  Location: [facility];  Service: Orthopedics;  Laterality: Right;  . UPPER GI ENDOSCOPY  12/18/2015   Procedure: UPPER GI ENDOSCOPY;  Surgeon: Ralene Ok, MD;  Location: [facility];  Service: General;;  . WRIST SURGERY Right 80yrs ago  . WRIST SURGERY Left     Social History   Socioeconomic History  .  Marital status: Married    Spouse name: Not on file  . Number of children: Not on file  . Years of education: Not on file  . Highest education level: Not on file  Occupational History  . Occupation: retired    Comment: [employer]  Social Needs  . Financial resource strain: Not on file  . Food insecurity:    Worry: Not on file    Inability: Not on file  . Transportation needs:    Medical: Not on file    Non-medical: Not on file  Tobacco Use  . Smoking status: Former Smoker    Packs/day: 1.50    Years: 25.00    Pack years: 37.50    Types: Cigarettes    Start date: 08/08/1960    Last attempt to quit: 06/02/1982    Years since quitting: 35.5  . Smokeless tobacco: Never Used  . Tobacco comment: quit smoking 30+yrs ago  Substance and Sexual Activity  . Alcohol use: No    Alcohol/week: 0.0 oz    Comment: occasionally   . Drug use: No  . Sexual activity: Yes  Lifestyle  . Physical activity:    Days per week: Not on file    Minutes per session: Not on file  . Stress: Not on file  Relationships  . Social connections:    Talks on phone: Not on file    Gets together: Not on file     Attends religious service: Not on file    Active member of club or organization: Not on file    Attends meetings of clubs or organizations: Not on file    Relationship status: Not on file  . Intimate partner violence:    Fear of current or ex partner: Not on file    Emotionally abused: Not on file    Physically abused: Not on file    Forced sexual activity: Not on file  Other Topics Concern  . Not on file  Social History Narrative  . Not on file     Vitals:   11/25/17 1020  BP: (!) 112/56  Pulse: (!) 41  SpO2: 96%  Weight: 229 lb (103.9 kg)  Height: 5\' 10"  (1.778 m)    Wt Readings from Last 3 Encounters:  11/25/17 229 lb (103.9 kg)  10/28/17 236 lb (107 kg)  08/25/17 246 lb (111.6 kg)     PHYSICAL EXAM General: NAD HEENT: Normal. Neck: No JVD, no thyromegaly. Lungs: Clear to auscultation bilaterally with normal respiratory effort. CV: Rated cardia, regular rhythm, normal S1/S2, no S3/S4, no murmur.  Left leg in compression stocking.  Trace bilateral leg edema.  No carotid bruit.   Abdomen: Soft, nontender, no distention.  Neurologic: Alert and oriented.  Psych: Normal affect. Skin: Normal. Musculoskeletal: No gross deformities.    ECG: Reviewed above under Subjective   Labs: Lab Results  Component Value Date/Time   K 4.3 10/28/2017 02:52 PM   BUN 21 10/28/2017 02:52 PM   CREATININE 1.33 (H) 10/28/2017 02:52 PM   CREATININE 1.27 07/17/2014 12:17 PM   ALT 29 10/28/2017 02:52 PM   TSH 3.070 10/28/2017 02:52 PM   HGB 11.1 (L) 10/28/2017 02:52 PM     Lipids: Lab Results  Component Value Date/Time   LDLCALC 75 03/22/2015 08:16 AM   CHOL 134 03/22/2015 08:16 AM   TRIG 92 03/22/2015 08:16 AM   HDL 41 03/22/2015 08:16 AM       ASSESSMENT AND PLAN: 1. Coronary artery disease with  a history of CABG: Symptomatically stable.  Currently on Lipitor, metoprolol, and lisinopril.  He is not on aspirin as he is on Xarelto.  He is also being treated for anemia.   He is markedly bradycardic with a heart rate in the low 40 bpm range.  I will discontinue metoprolol.  I will also discontinue amiodarone.  2. Atrial fibrillation and flutter status post ablation in February 2019 and subsequent cardioversion: Symptomatically stable.  He is markedly bradycardic.  I will discontinue both amiodarone and metoprolol.  He is anticoagulated with Xarelto.  3. Hypertension: Blood pressure is normal.  4. Hyperlipidemia: Continue Lipitor 40 mg.  I will obtain a copy of lipids from PCP.  5. Carotid artery stenosis status post left CEA: Carotid Dopplers 03/31/17 showed near complete occlusion of the right common carotid and right internal carotid arteries with patent left carotid endarterectomy with 1-39% stenosis and evidence of hyperplasia. Continue statin.He is on Xarelto for atrial fibrillation.  6. Chronic systolic heart failure:Symptomatically stable.  Weight is down 17 pounds since last visit with me.  Currently on Lasix 40 mg twice daily and supplemental potassium.  I will continue lisinopril but stop metoprolol given marked bradycardia.  7.  Anemia: Hemoglobin up to 11.1 on 10/28/2017.  It had been 9 3 months prior.  This may be related to iron malabsorption.  I encouraged him to call his gastroenterologist to schedule upper and lower endoscopy.  8.  Mediastinal adenopathy: Most recent chest CT reviewed above demonstrating a reduction in lymph node size.   Disposition: Follow up 6 months with me.  Follow-up with [name] as scheduled.   Kate Sable, [name], F.A.C.C.

## 2017-11-25 NOTE — Telephone Encounter (Signed)
Patient is scheduled for colonoscopy & endoscopy on 12/30/17, he need to stop Xarelto 2 days prior -- please advise if ok to stop

## 2017-11-25 NOTE — Patient Instructions (Signed)
Medication Instructions:  STOP AMIODARONE STOP METOLPROLOL  Labwork: NONE  Testing/Procedures: NONE   Follow-Up: Your physician wants you to follow-up in: 6 MONTHS . You will receive a reminder letter in the mail two months in advance. If you don't receive a letter, please call our office to schedule the follow-up appointment.   Any Other Special Instructions Will Be Listed Below (If Applicable).     If you need a refill on your cardiac medications before your next appointment, please call your pharmacy.

## 2017-11-25 NOTE — Telephone Encounter (Signed)
OK to hold Xarelto 48 hours before procedures.  Resume night of procedure if ok with MD.

## 2017-11-26 NOTE — Telephone Encounter (Signed)
Patient aware.

## 2017-12-05 ENCOUNTER — Other Ambulatory Visit: Payer: Self-pay | Admitting: Cardiovascular Disease

## 2017-12-06 ENCOUNTER — Other Ambulatory Visit: Payer: Self-pay | Admitting: Cardiovascular Disease

## 2017-12-14 DIAGNOSIS — Z79899 Other long term (current) drug therapy: Secondary | ICD-10-CM | POA: Diagnosis not present

## 2017-12-14 DIAGNOSIS — M109 Gout, unspecified: Secondary | ICD-10-CM | POA: Diagnosis not present

## 2017-12-14 DIAGNOSIS — I480 Paroxysmal atrial fibrillation: Secondary | ICD-10-CM | POA: Diagnosis not present

## 2017-12-14 DIAGNOSIS — E785 Hyperlipidemia, unspecified: Secondary | ICD-10-CM | POA: Diagnosis not present

## 2017-12-14 DIAGNOSIS — D649 Anemia, unspecified: Secondary | ICD-10-CM | POA: Diagnosis not present

## 2017-12-14 DIAGNOSIS — I5022 Chronic systolic (congestive) heart failure: Secondary | ICD-10-CM | POA: Diagnosis not present

## 2017-12-28 ENCOUNTER — Other Ambulatory Visit (INDEPENDENT_AMBULATORY_CARE_PROVIDER_SITE_OTHER): Payer: Self-pay | Admitting: *Deleted

## 2017-12-28 DIAGNOSIS — Z1211 Encounter for screening for malignant neoplasm of colon: Secondary | ICD-10-CM

## 2017-12-28 DIAGNOSIS — D509 Iron deficiency anemia, unspecified: Secondary | ICD-10-CM

## 2017-12-29 DIAGNOSIS — I10 Essential (primary) hypertension: Secondary | ICD-10-CM | POA: Diagnosis not present

## 2017-12-29 DIAGNOSIS — I480 Paroxysmal atrial fibrillation: Secondary | ICD-10-CM | POA: Diagnosis not present

## 2017-12-29 DIAGNOSIS — I2510 Atherosclerotic heart disease of native coronary artery without angina pectoris: Secondary | ICD-10-CM | POA: Diagnosis not present

## 2017-12-29 DIAGNOSIS — I5022 Chronic systolic (congestive) heart failure: Secondary | ICD-10-CM | POA: Diagnosis not present

## 2017-12-30 ENCOUNTER — Ambulatory Visit (HOSPITAL_COMMUNITY)
Admission: RE | Admit: 2017-12-30 | Discharge: 2017-12-30 | Disposition: A | Discharge status: Home / Self Care | Payer: Medicare Other | Source: Ambulatory Visit | Attending: Internal Medicine | Admitting: Internal Medicine

## 2017-12-30 ENCOUNTER — Telehealth: Payer: Self-pay

## 2017-12-30 ENCOUNTER — Encounter (HOSPITAL_COMMUNITY): Payer: Self-pay | Admitting: *Deleted

## 2017-12-30 ENCOUNTER — Encounter (HOSPITAL_COMMUNITY)
Admission: RE | Disposition: A | Discharge status: Home / Self Care | Payer: Self-pay | Source: Ambulatory Visit | Attending: Internal Medicine

## 2017-12-30 ENCOUNTER — Other Ambulatory Visit: Payer: Self-pay

## 2017-12-30 ENCOUNTER — Encounter (HOSPITAL_COMMUNITY): Payer: Self-pay | Admitting: Anesthesiology

## 2017-12-30 DIAGNOSIS — I2510 Atherosclerotic heart disease of native coronary artery without angina pectoris: Secondary | ICD-10-CM | POA: Diagnosis not present

## 2017-12-30 DIAGNOSIS — I481 Persistent atrial fibrillation: Secondary | ICD-10-CM | POA: Insufficient documentation

## 2017-12-30 DIAGNOSIS — Z1211 Encounter for screening for malignant neoplasm of colon: Secondary | ICD-10-CM | POA: Insufficient documentation

## 2017-12-30 DIAGNOSIS — I119 Hypertensive heart disease without heart failure: Secondary | ICD-10-CM | POA: Diagnosis not present

## 2017-12-30 DIAGNOSIS — K228 Other specified diseases of esophagus: Secondary | ICD-10-CM | POA: Insufficient documentation

## 2017-12-30 DIAGNOSIS — Z951 Presence of aortocoronary bypass graft: Secondary | ICD-10-CM | POA: Insufficient documentation

## 2017-12-30 DIAGNOSIS — G4730 Sleep apnea, unspecified: Secondary | ICD-10-CM | POA: Diagnosis not present

## 2017-12-30 DIAGNOSIS — Z79899 Other long term (current) drug therapy: Secondary | ICD-10-CM | POA: Diagnosis not present

## 2017-12-30 DIAGNOSIS — K220 Achalasia of cardia: Secondary | ICD-10-CM | POA: Diagnosis not present

## 2017-12-30 DIAGNOSIS — D1779 Benign lipomatous neoplasm of other sites: Secondary | ICD-10-CM | POA: Insufficient documentation

## 2017-12-30 DIAGNOSIS — D649 Anemia, unspecified: Secondary | ICD-10-CM | POA: Diagnosis not present

## 2017-12-30 DIAGNOSIS — Z87891 Personal history of nicotine dependence: Secondary | ICD-10-CM | POA: Insufficient documentation

## 2017-12-30 DIAGNOSIS — K644 Residual hemorrhoidal skin tags: Secondary | ICD-10-CM | POA: Insufficient documentation

## 2017-12-30 DIAGNOSIS — K5730 Diverticulosis of large intestine without perforation or abscess without bleeding: Secondary | ICD-10-CM | POA: Insufficient documentation

## 2017-12-30 DIAGNOSIS — D120 Benign neoplasm of cecum: Secondary | ICD-10-CM | POA: Insufficient documentation

## 2017-12-30 DIAGNOSIS — Z882 Allergy status to sulfonamides status: Secondary | ICD-10-CM | POA: Insufficient documentation

## 2017-12-30 DIAGNOSIS — D509 Iron deficiency anemia, unspecified: Secondary | ICD-10-CM | POA: Diagnosis not present

## 2017-12-30 DIAGNOSIS — E785 Hyperlipidemia, unspecified: Secondary | ICD-10-CM | POA: Diagnosis not present

## 2017-12-30 DIAGNOSIS — Z7901 Long term (current) use of anticoagulants: Secondary | ICD-10-CM | POA: Diagnosis not present

## 2017-12-30 DIAGNOSIS — K219 Gastro-esophageal reflux disease without esophagitis: Secondary | ICD-10-CM | POA: Insufficient documentation

## 2017-12-30 DIAGNOSIS — D508 Other iron deficiency anemias: Secondary | ICD-10-CM

## 2017-12-30 HISTORY — PX: COLONOSCOPY: SHX5424

## 2017-12-30 HISTORY — PX: ESOPHAGOGASTRODUODENOSCOPY: SHX5428

## 2017-12-30 LAB — HEMOGLOBIN AND HEMATOCRIT, BLOOD
HEMATOCRIT: 34.3 % — AB (ref 39.0–52.0)
Hemoglobin: 10.8 g/dL — ABNORMAL LOW (ref 13.0–17.0)

## 2017-12-30 SURGERY — EGD (ESOPHAGOGASTRODUODENOSCOPY)
Anesthesia: Moderate Sedation

## 2017-12-30 MED ORDER — ATORVASTATIN CALCIUM 80 MG PO TABS: 80.0000 mg | ORAL_TABLET | Freq: Every evening | ORAL | 3 refills | Status: DC

## 2017-12-30 MED ORDER — LIDOCAINE VISCOUS HCL 2 % MT SOLN
OROMUCOSAL | Status: DC | PRN
  Administered 2017-12-30: 4 mL via OROMUCOSAL

## 2017-12-30 MED ORDER — LIDOCAINE VISCOUS HCL 2 % MT SOLN
OROMUCOSAL | Status: AC
  Filled 2017-12-30: qty 15

## 2017-12-30 MED ORDER — MEPERIDINE HCL 50 MG/ML IJ SOLN
INTRAMUSCULAR | Status: DC | PRN
  Administered 2017-12-30 (×2): 25 mg via INTRAVENOUS

## 2017-12-30 MED ORDER — SODIUM CHLORIDE 0.9 % IV SOLN
INTRAVENOUS | Status: DC
  Administered 2017-12-30: 1000 mL via INTRAVENOUS

## 2017-12-30 MED ORDER — MEPERIDINE HCL 50 MG/ML IJ SOLN
INTRAMUSCULAR | Status: AC
  Filled 2017-12-30: qty 1

## 2017-12-30 MED ORDER — STERILE WATER FOR IRRIGATION IR SOLN
Status: DC | PRN
  Administered 2017-12-30: 100 mL

## 2017-12-30 MED ORDER — MIDAZOLAM HCL 5 MG/5ML IJ SOLN
INTRAMUSCULAR | Status: DC | PRN
  Administered 2017-12-30 (×2): 2 mg via INTRAVENOUS
  Administered 2017-12-30 (×2): 1 mg via INTRAVENOUS

## 2017-12-30 MED ORDER — MIDAZOLAM HCL 5 MG/5ML IJ SOLN
INTRAMUSCULAR | Status: AC
  Filled 2017-12-30: qty 10

## 2017-12-30 NOTE — H&P (Signed)
Caleb Wolf is an 77 y.o. male.   Chief Complaint: Patient is here for EGD and colonoscopy. HPI: Patient is 77 year old Caucasian male with history of achalasia status post laparoscopic myotomy who was found to have iron deficiency anemia over 4 months ago.  C he was seen in the office in March 2019.  His stool was guaiac negative.  He did not have any alarm symptoms.  Since patient was on Xarelto with history of A. fib was decided to delay the procedure until he could come off anticoagulant for 2 days.  His H&H has been coming up with p.o. iron.  He has no difficulty swallowing.  He denies nausea vomiting epigastric pain melena or rectal bleeding.  Last colonoscopy was in January 2009. Family History is negative for CRC.  Past Medical History:  Diagnosis Date  . Arthritis   . Basal cell carcinoma   . Carotid stenosis   . Coronary artery disease    Multvessel s/p CABG 2015  . Enlarged prostate   . Essential hypertension   . GERD (gastroesophageal reflux disease)   . Gout   .    Marland Kitchen History of kidney stones   . Hyperlipidemia   . Lumbar disc disease   . Persistent atrial fibrillation ([place])   . Sleep apnea    CPAP    Past Surgical History:  Procedure Laterality Date  . ABLATION OF DYSRHYTHMIC FOCUS  07/28/2017  . ATRIAL FIBRILLATION ABLATION N/A 07/28/2017   Procedure: ATRIAL FIBRILLATION ABLATION;  Surgeon: Thompson Grayer, MD;  Location: [place] [facility];  Service: Cardiovascular;  Laterality: N/A;  . BACK SURGERY    . CARDIAC CATHETERIZATION  05/22/2014   Procedure: IABP INSERTION;  Surgeon: Leonie Man, MD;  Location: [facility] CATH LAB;  Service: Cardiovascular;;  . CARDIOVERSION N/A 04/29/2017   Procedure: CARDIOVERSION;  Surgeon: Satira Sark, MD;  [facility];  Service: Cardiovascular;  Laterality: N/A;  . CARDIOVERSION N/A 08/13/2017   Procedure: CARDIOVERSION;  Surgeon: Pixie Casino, MD;  Location: [facility] ENDOSCOPY;  Service: Cardiovascular;  Laterality: N/A;   . Cataract surgery Right   . COLONOSCOPY    . CORONARY ARTERY BYPASS GRAFT N/A 05/22/2014   Procedure: CORONARY ARTERY BYPASS GRAFTING (CABG) times three using left internal mammary and right saphenous vein.;  Surgeon: Melrose Nakayama, MD;  Location: [place];  Service: Open Heart Surgery;  Laterality: N/A;  . ENDARTERECTOMY Left 02/17/2014   Procedure: ENDARTERECTOMY CAROTID WITH PATCH ANGIOPLASTY;  Surgeon: Mal Misty, MD;  Location: [place];  Service: Vascular;  Laterality: Left;  . ESOPHAGEAL DILATION N/A 08/22/2015   Procedure: ESOPHAGEAL DILATION;  Surgeon: Rogene Houston, MD;  [facility];  Service: Endoscopy;  Laterality: N/A;  . ESOPHAGOGASTRODUODENOSCOPY N/A 08/22/2015   Procedure: ESOPHAGOGASTRODUODENOSCOPY (EGD);  Surgeon: Rogene Houston, MD;  [facility];  Service: Endoscopy;  Laterality: N/A;  12:45 - moved to 1:55 - Ann notified pt  . EYE SURGERY     cataract extraction (right) with repair macular tear , with IOL     right  . FRACTURE SURGERY     bilateral wrist fractures- one ORIF  . JOINT REPLACEMENT  2011   left knee  . LEFT HEART CATHETERIZATION WITH CORONARY ANGIOGRAM N/A 05/22/2014   Procedure: LEFT HEART CATHETERIZATION WITH CORONARY ANGIOGRAM;  Surgeon: Leonie Man, MD;  Location: [facility] CATH LAB;  Service: Cardiovascular;  Laterality: N/A;  . LITHOTRIPSY    . PARS PLANA VITRECTOMY W/  REPAIR OF MACULAR HOLE    . RHINOPLASTY    . TEE WITHOUT CARDIOVERSION N/A 04/29/2017   Procedure: TRANSESOPHAGEAL ECHOCARDIOGRAM (TEE) WITH PROPOFOL;  Surgeon: Satira Sark, MD;  [facility];  Service: Cardiovascular;  Laterality: N/A;  . TONSILLECTOMY    . TOTAL HIP ARTHROPLASTY  12/08/2011   Procedure: TOTAL HIP ARTHROPLASTY;  Surgeon: Gearlean Alf, MD;  [facility];  Service: Orthopedics;  Laterality: Right;  . UPPER GI ENDOSCOPY  12/18/2015   Procedure: UPPER GI ENDOSCOPY;  Surgeon: Ralene Ok, MD;  [facility];   Service: General;;  . WRIST SURGERY Right 24yrs ago  . WRIST SURGERY Left     Family History  Problem Relation Age of Onset  . Allergies Mother   . Heart disease Mother   . Hypertension Mother   . Heart disease Father        MI   Social History:  reports that he quit smoking about 35 years ago. His smoking use included cigarettes. He started smoking about 57 years ago. He has a 37.50 pack-year smoking history. He has never used smokeless tobacco. He reports that he does not drink alcohol or use drugs.  Allergies:  Allergies  Allergen Reactions  . Codeine Sulfate Nausea Only    Medications Prior to Admission  Medication Sig Dispense Refill  . atorvastatin (LIPITOR) 40 MG tablet TAKE 1 TABLET BY MOUTH IN THE EVENING (Patient taking differently: TAKE 40 MG BY MOUTH IN THE EVENING) 90 tablet 2  . Cyanocobalamin (B-12) 1000 MCG SUBL Place 1,000 mcg under the tongue daily.     Marland Kitchen doxazosin (CARDURA) 4 MG tablet Take 4 mg by mouth daily.  4  . finasteride (PROSCAR) 5 MG tablet Take 5 mg by mouth every evening.     . furosemide (LASIX) 40 MG tablet Take 1 tablet (40 mg total) by mouth 2 (two) times daily. 180 tablet 1  . lisinopril (PRINIVIL,ZESTRIL) 40 MG tablet Take 40 mg by mouth daily.    . pantoprazole (PROTONIX) 40 MG tablet Take 1 tablet (40 mg total) by mouth daily before breakfast. (Patient taking differently: Take 40 mg by mouth every evening. ) 90 tablet 3  . Pediatric Multivitamins-Iron (FLINTSTONES PLUS IRON) chewable tablet Chew 1 tablet by mouth 2 (two) times daily.    . potassium chloride SA (KLOR-CON M20) 20 MEQ tablet Take 1 tablet (20 mEq total) by mouth 2 (two) times daily. 180 tablet 0  . rivaroxaban (XARELTO) 20 MG TABS tablet Take 1 tablet (20 mg total) by mouth daily. 30 tablet 11  . colchicine 0.6 MG tablet Take 0.6 mg by mouth daily as needed (for gout flare up).     . cyclobenzaprine (FLEXERIL) 10 MG tablet Take 10 mg by mouth at bedtime as needed for muscle spasms   1  . nitroGLYCERIN (NITROSTAT) 0.4 MG SL tablet PLACE 1 TABLET UNDER THE TONGUE EVERY 5 MINUTES AS NEEDED FOR CHEST PAIN (Patient taking differently: Place 0.4 mg under the tongue every 5 (five) minutes as needed for chest pain. ) 25 tablet 3    No results found for this or any previous visit (from the past 48 hour(s)). No results found.  ROS  Blood pressure (!) 138/58, pulse (!) 57, temperature 97.8 F (36.6 C), temperature source Oral, resp. rate 18, height 5\' 10"  (1.778 m), weight 224 lb (101.6 kg), SpO2 99 %. Physical Exam  Constitutional: He appears well-developed and well-nourished.  HENT:  Mouth/Throat: Oropharynx is clear  and moist.  Eyes: Conjunctivae are normal. No scleral icterus.  Neck: No thyromegaly present.  Cardiovascular: Normal rate, regular rhythm and normal heart sounds.  No murmur heard. Respiratory: Effort normal and breath sounds normal.  GI:  Abdomen is full but soft and nontender with organomegaly or masses.  Musculoskeletal: He exhibits no edema.  Lymphadenopathy:    He has no cervical adenopathy.  Neurological: He is alert.  Skin: Skin is warm and dry.     Assessment/Plan History of achalasia. Iron deficiency anemia. Diagnostic EGD and colonoscopy.  Hildred Laser, MD 12/30/2017, 3:24 PM

## 2017-12-30 NOTE — Telephone Encounter (Signed)
E-scribed atorvastatin to 80 mg, left message for patient to call back

## 2017-12-30 NOTE — Op Note (Signed)
[facility] Patient Name: Caleb Wolf Procedure Date: 12/30/2017 3:03 PM MRN: [id number] Date of Birth: 09-08-1940 Attending MD: Hildred Laser , MD CSN: [id number] Age: 77 Admit Type: Outpatient Procedure:                Upper GI endoscopy Indications:              Unexplained iron deficiency anemia, Follow-up of                            achalasia Providers:                Hildred Laser, MD, Janeece Riggers, RN, Hinton Rao,                            RN Referring MD:             Asencion Noble, MD Medicines:                Lidocaine spray, Meperidine 50 mg IV, Midazolam 5                            mg IV Complications:            No immediate complications. Estimated Blood Loss:     Estimated blood loss: none. Procedure:                Pre-Anesthesia Assessment:                           - Prior to the procedure, a History and Physical                            was performed, and patient medications and                            allergies were reviewed. The patient's tolerance of                            previous anesthesia was also reviewed. The risks                            and benefits of the procedure and the sedation                            options and risks were discussed with the patient.                            All questions were answered, and informed consent                            was obtained. Prior Anticoagulants: The patient                            last took Xarelto (rivaroxaban) 2 days prior to the  procedure. ASA Grade Assessment: III - A patient                            with severe systemic disease. After reviewing the                            risks and benefits, the patient was deemed in                            satisfactory condition to undergo the procedure.                           After obtaining informed consent, the endoscope was                            passed under direct vision. Throughout the                   procedure, the patient's blood pressure, pulse, and                            oxygen saturations were monitored continuously. The                            GIF-H190 ([id number]) scope was introduced through the                            mouth, and advanced to the second part of duodenum.                            The upper GI endoscopy was accomplished without                            difficulty. The patient tolerated the procedure                            well. Scope In: 3:35:18 PM Scope Out: 3:42:00 PM Total Procedure Duration: 0 hours 6 minutes 42 seconds  Findings:      The lumen of the mid esophagus was mildly dilated.      The Z-line was regular and was found 40 cm from the incisors.      partial fundal wrao      The exam of the stomach was otherwise normal.      The duodenal bulb was normal.      There was a lipoma in the second portion of the duodenum.      The major papilla was normal. Impression:               - Dilation in the mid esophagus.                           - Z-line regular, 40 cm from the incisors.                           - Normal duodenal bulb.                           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                           - Normal major papilla.                           - No specimens collected. Moderate Sedation:      Moderate (conscious) sedation was administered by the endoscopy nurse       and supervised by the endoscopist. The following parameters were       monitored: oxygen saturation, heart rate, blood pressure, CO2       capnography and response to care. Total physician intraservice time was       11 minutes. Recommendation:           - Patient has a contact number available for                            emergencies. The signs and symptoms of potential                            delayed complications were discussed with the                            patient. Return to normal activities tomorrow.                             Written discharge instructions were provided to the                            patient.                           - Resume previous diet today.                           - Continue present medications.                           - Repeat upper endoscopy in 3 years for                            surveillance.                           - See the other procedure note for documentation of                            additional recommendations. Procedure Code(s):        --- Professional ---                           [phone], Esophagogastroduodenoscopy, flexible,                            transoral; diagnostic, including collection of  specimen(s) by brushing or washing, when performed                            (separate procedure)                           G0500, Moderate sedation services provided by the                            same physician or other qualified health care                            professional performing a gastrointestinal                            endoscopic service that sedation supports,                            requiring the presence of an independent trained                            observer to assist in the monitoring of the                            patient's level of consciousness and physiological                            status; initial 15 minutes of intra-service time;                            patient age 69 years or older (additional time may                            be reported with [phone], as appropriate) Diagnosis Code(s):        --- Professional ---                           K22.8, Other specified diseases of esophagus                           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 All  rights reserved. The codes documented in this report are preliminary and upon coder review may  be revised to meet current compliance requirements. Hildred Laser, MD Hildred Laser, MD 12/30/2017 4:40:52 PM This report has been signed electronically. Number of Addenda: 0

## 2017-12-30 NOTE — Discharge Instructions (Signed)
Resume Xarelto on 12/31/2017. Resume other medications as before. Resume usual diet. No driving for 24 hours. Physician will call with biopsy results. Will check H&H and iron studies in 4 weeks.  Office will call.   Colonoscopy, Adult, Care After This sheet gives you information about how to care for yourself after your procedure. Your doctor may also give you more specific instructions. If you have problems or questions, call your doctor. Follow these instructions at home: General instructions   For the first 24 hours after the procedure: ? Do not drive or use machinery. ? Do not sign important documents. ? Do not drink alcohol. ? Do your daily activities more slowly than normal. ? Eat foods that are soft and easy to digest. ? Rest often.  Take over-the-counter or prescription medicines only as told by your doctor.  It is up to you to get the results of your procedure. Ask your doctor, or the department performing the procedure, when your results will be ready. To help cramping and bloating:  Try walking around.  Put heat on your belly (abdomen) as told by your doctor. Use a heat source that your doctor recommends, such as a moist heat pack or a heating pad. ? Put a towel between your skin and the heat source. ? Leave the heat on for 20-30 minutes. ? Remove the heat if your skin turns bright red. This is especially important if you cannot feel pain, heat, or cold. You can get burned. Eating and drinking  Drink enough fluid to keep your pee (urine) clear or pale yellow.  Return to your normal diet as told by your doctor. Avoid heavy or fried foods that are hard to digest.  Avoid drinking alcohol for as long as told by your doctor. Contact a doctor if:  You have blood in your poop (stool) 2-3 days after the procedure. Get help right away if:  You have more than a small amount of blood in your poop.  You see large clumps of tissue (blood clots) in your poop.  Your belly  is swollen.  You feel sick to your stomach (nauseous).  You throw up (vomit).  You have a fever.  You have belly pain that gets worse, and medicine does not help your pain. This information is not intended to replace advice given to you by your health care provider. Make sure you discuss any questions you have with your health care provider. Document Released: 06/21/2010 Document Revised: 02/11/2016 Document Reviewed: 02/11/2016 Elsevier Interactive Patient Education  2017 [facility], Care After Refer to this sheet in the next few weeks. These instructions provide you with information about caring for yourself after your procedure. Your health care provider may also give you more specific instructions. Your treatment has been planned according to current medical practices, but problems sometimes occur. Call your health care provider if you have any problems or questions after your procedure. What can I expect after the procedure? After the procedure, it is common to have:  A sore throat.  Bloating.  Nausea.  Follow these instructions at home:  Follow instructions from your health care provider about what to eat or drink after your procedure.  Return to your normal activities as told by your health care provider. Ask your health care provider what activities are safe for you.  Take over-the-counter and prescription medicines only as told by your health care provider.  Do not drive for 24 hours if you received a sedative.  Keep all follow-up visits as told by your health care provider. This is important. Contact a health care provider if:  You have a sore throat that lasts longer than one day.  You have trouble swallowing. Get help right away if:  You have a fever.  You vomit blood or your vomit looks like coffee grounds.  You have bloody, black, or tarry stools.  You have a severe sore throat or you cannot swallow.  You have difficulty  breathing.  You have severe pain in your chest or belly. This information is not intended to replace advice given to you by your health care provider. Make sure you discuss any questions you have with your health care provider. Document Released: 11/18/2011 Document Revised: 10/25/2015 Document Reviewed: 03/01/2015 Elsevier Interactive Patient Education  2018 [employer].  Diverticulosis Diverticulosis is a condition that develops when small pouches (diverticula) form in the wall of the large intestine (colon). The colon is where water is absorbed and stool is formed. The pouches form when the inside layer of the colon pushes through weak spots in the outer layers of the colon. You may have a few pouches or many of them. What are the causes? The cause of this condition is not known. What increases the risk? The following factors may make you more likely to develop this condition:  Being older than age 35. Your risk for this condition increases with age. Diverticulosis is rare among people younger than age 50. By age 43, many people have it.  Eating a low-fiber diet.  Having frequent constipation.  Being overweight.  Not getting enough exercise.  Smoking.  Taking over-the-counter pain medicines, like aspirin and ibuprofen.  Having a family history of diverticulosis.  What are the signs or symptoms? In most people, there are no symptoms of this condition. If you do have symptoms, they may include:  Bloating.  Cramps in the abdomen.  Constipation or diarrhea.  Pain in the lower left side of the abdomen.  How is this diagnosed? This condition is most often diagnosed during an exam for other colon problems. Because diverticulosis usually has no symptoms, it often cannot be diagnosed independently. This condition may be diagnosed by:  Using a flexible scope to examine the colon (colonoscopy).  Taking an X-ray of the colon after dye has been put into the colon (barium  enema).  Doing a CT scan.  How is this treated? You may not need treatment for this condition if you have never developed an infection related to diverticulosis. If you have had an infection before, treatment may include:  Eating a high-fiber diet. This may include eating more fruits, vegetables, and grains.  Taking a fiber supplement.  Taking a live bacteria supplement (probiotic).  Taking medicine to relax your colon.  Taking antibiotic medicines.  Follow these instructions at home:  Drink 6-8 glasses of water or more each day to prevent constipation.  Try not to strain when you have a bowel movement.  If you have had an infection before: ? Eat more fiber as directed by your health care provider or your diet and nutrition specialist (dietitian). ? Take a fiber supplement or probiotic, if your health care provider approves.  Take over-the-counter and prescription medicines only as told by your health care provider.  If you were prescribed an antibiotic, take it as told by your health care provider. Do not stop taking the antibiotic even if you start to feel better.  Keep all follow-up visits as told by  your health care provider. This is important. Contact a health care provider if:  You have pain in your abdomen.  You have bloating.  You have cramps.  You have not had a bowel movement in 3 days. Get help right away if:  Your pain gets worse.  Your bloating becomes very bad.  You have a fever or chills, and your symptoms suddenly get worse.  You vomit.  You have bowel movements that are bloody or black.  You have bleeding from your rectum. Summary  Diverticulosis is a condition that develops when small pouches (diverticula) form in the wall of the large intestine (colon).  You may have a few pouches or many of them.  This condition is most often diagnosed during an exam for other colon problems.  If you have had an infection related to diverticulosis,  treatment may include increasing the fiber in your diet, taking supplements, or taking medicines. This information is not intended to replace advice given to you by your health care provider. Make sure you discuss any questions you have with your health care provider. Document Released: 02/14/2004 Document Revised: 04/07/2016 Document Reviewed: 04/07/2016 Elsevier Interactive Patient Education  2017 [employer].  Hemorrhoids Hemorrhoids are swollen veins in and around the rectum or anus. There are two types of hemorrhoids:  Internal hemorrhoids. These occur in the veins that are just inside the rectum. They may poke through to the outside and become irritated and painful.  External hemorrhoids. These occur in the veins that are outside of the anus and can be felt as a painful swelling or hard lump near the anus.  Most hemorrhoids do not cause serious problems, and they can be managed with home treatments such as diet and lifestyle changes. If home treatments do not help your symptoms, procedures can be done to shrink or remove the hemorrhoids. What are the causes? This condition is caused by increased pressure in the anal area. This pressure may result from various things, including:  Constipation.  Straining to have a bowel movement.  Diarrhea.  Pregnancy.  Obesity.  Sitting for long periods of time.  Heavy lifting or other activity that causes you to strain.  Anal sex.  What are the signs or symptoms? Symptoms of this condition include:  Pain.  Anal itching or irritation.  Rectal bleeding.  Leakage of stool (feces).  Anal swelling.  One or more lumps around the anus.  How is this diagnosed? This condition can often be diagnosed through a visual exam. Other exams or tests may also be done, such as:  Examination of the rectal area with a gloved hand (digital rectal exam).  Examination of the anal canal using a small tube (anoscope).  A blood test, if you have  lost a significant amount of blood.  A test to look inside the colon (sigmoidoscopy or colonoscopy).  How is this treated? This condition can usually be treated at home. However, various procedures may be done if dietary changes, lifestyle changes, and other home treatments do not help your symptoms. These procedures can help make the hemorrhoids smaller or remove them completely. Some of these procedures involve surgery, and others do not. Common procedures include:  Rubber band ligation. Rubber bands are placed at the base of the hemorrhoids to cut off the blood supply to them.  Sclerotherapy. Medicine is injected into the hemorrhoids to shrink them.  Infrared coagulation. A type of light energy is used to get rid of the hemorrhoids.  Hemorrhoidectomy surgery. The  hemorrhoids are surgically removed, and the veins that supply them are tied off.  Stapled hemorrhoidopexy surgery. A circular stapling device is used to remove the hemorrhoids and use staples to cut off the blood supply to them.  Follow these instructions at home: Eating and drinking  Eat foods that have a lot of fiber in them, such as whole grains, beans, nuts, fruits, and vegetables. Ask your health care provider about taking products that have added fiber (fiber supplements).  Drink enough fluid to keep your urine clear or pale yellow. Managing pain and swelling  Take warm sitz baths for 20 minutes, 3-4 times a day to ease pain and discomfort.  If directed, apply ice to the affected area. Using ice packs between sitz baths may be helpful. ? Put ice in a plastic bag. ? Place a towel between your skin and the bag. ? Leave the ice on for 20 minutes, 2-3 times a day. General instructions  Take over-the-counter and prescription medicines only as told by your health care provider.  Use medicated creams or suppositories as told.  Exercise regularly.  Go to the bathroom when you have the urge to have a bowel movement. Do  not wait.  Avoid straining to have bowel movements.  Keep the anal area dry and clean. Use wet toilet paper or moist towelettes after a bowel movement.  Do not sit on the toilet for long periods of time. This increases blood pooling and pain. Contact a health care provider if:  You have increasing pain and swelling that are not controlled by treatment or medicine.  You have uncontrolled bleeding.  You have difficulty having a bowel movement, or you are unable to have a bowel movement.  You have pain or inflammation outside the area of the hemorrhoids. This information is not intended to replace advice given to you by your health care provider. Make sure you discuss any questions you have with your health care provider. Document Released: 05/16/2000 Document Revised: 10/17/2015 Document Reviewed: 01/31/2015 Elsevier Interactive Patient Education  [employer].

## 2017-12-30 NOTE — Op Note (Signed)
[facility] Patient Name: Caleb Wolf Procedure Date: 12/30/2017 3:43 PM MRN: [id number] Date of Birth: Dec 30, 1940 Attending MD: Hildred Laser , MD CSN: [id number] Age: 77 Admit Type: Outpatient Procedure:                Colonoscopy Indications:              Unexplained iron deficiency anemia Providers:                Hildred Laser, MD, Janeece Riggers, RN, Rosina Lowenstein, RN Referring MD:             Asencion Noble, MD Medicines:                Midazolam 1 mg IV Complications:            No immediate complications. Estimated Blood Loss:     Estimated blood loss was minimal. Procedure:                Pre-Anesthesia Assessment:                           - Prior to the procedure, a History and Physical                            was performed, and patient medications and                            allergies were reviewed. The patient's tolerance of                            previous anesthesia was also reviewed. The risks                            and benefits of the procedure and the sedation                            options and risks were discussed with the patient.                            All questions were answered, and informed consent                            was obtained. Prior Anticoagulants: The patient                            last took Xarelto (rivaroxaban) 2 days prior to the                            procedure. ASA Grade Assessment: III - A patient                            with severe systemic disease. After reviewing the                            risks and benefits, the patient was deemed in  satisfactory condition to undergo the procedure.                           After obtaining informed consent, the colonoscope                            was passed under direct vision. Throughout the                            procedure, the patient's blood pressure, pulse, and                            oxygen saturations were monitored continuously.  The                            PCF-[id number] ([id number]) scope was introduced through                            the anus and advanced to the the cecum, identified                            by appendiceal orifice and ileocecal valve. The                            colonoscopy was performed without difficulty. The                            patient tolerated the procedure well. The quality                            of the bowel preparation was adequate. The                            ileocecal valve, appendiceal orifice, and rectum                            were photographed. Scope In: 3:47:16 PM Scope Out: 4:08:23 PM Scope Withdrawal Time: 0 hours 13 minutes 58 seconds  Total Procedure Duration: 0 hours 21 minutes 7 seconds  Findings:      The perianal and digital rectal examinations were normal.      Two sessile polyps were found in the cecum. The polyps were small in       size. These were biopsied with a cold forceps for histology. The       pathology specimen was placed into Bottle Number 1.      Two medium-mouthed diverticula were found in the splenic flexure.      External hemorrhoids were found during retroflexion. The hemorrhoids       were small. Impression:               - Two small polyps in the cecum. Biopsied.                           - Diverticulosis at the splenic flexure.                           -  External hemorrhoids. Moderate Sedation:      Moderate (conscious) sedation was administered by the endoscopy nurse       and supervised by the endoscopist. The following parameters were       monitored: oxygen saturation, heart rate, blood pressure, CO2       capnography and response to care. Total physician intraservice time was       21 minutes. Recommendation:           - Patient has a contact number available for                            emergencies. The signs and symptoms of potential                            delayed complications were discussed with the                             patient. Return to normal activities tomorrow.                            Written discharge instructions were provided to the                            patient.                           - Resume previous diet today.                           - Resume Xarelto (rivaroxaban) at prior dose                            tomorrow.                           - Await pathology results.                           - No recommendation at this time regarding repeat                            colonoscopy.                           - Resume [place] chewable one tablet bid.                           - H/H today. Procedure Code(s):        --- Professional ---                           [phone], Colonoscopy, flexible; with biopsy, single                            or multiple                           G0500, Moderate  sedation services provided by the                            same physician or other qualified health care                            professional performing a gastrointestinal                            endoscopic service that sedation supports,                            requiring the presence of an independent trained                            observer to assist in the monitoring of the                            patient's level of consciousness and physiological                            status; initial 15 minutes of intra-service time;                            patient age 32 years or older (additional time may                            be reported with [phone], as appropriate) Diagnosis Code(s):        --- Professional ---                           D12.0, Benign neoplasm of cecum                           K64.4, Residual hemorrhoidal skin tags                           D50.9, Iron deficiency anemia, unspecified                           K57.30, Diverticulosis of large intestine without                            perforation or abscess without bleeding CPT copyright 2017  American Medical Association. All rights reserved. The codes documented in this report are preliminary and upon coder review may  be revised to meet current compliance requirements. Hildred Laser, MD Hildred Laser, MD 12/30/2017 4:53:36 PM This report has been signed electronically. Number of Addenda: 0

## 2017-12-31 ENCOUNTER — Other Ambulatory Visit (INDEPENDENT_AMBULATORY_CARE_PROVIDER_SITE_OTHER): Payer: Self-pay | Admitting: *Deleted

## 2017-12-31 ENCOUNTER — Encounter (INDEPENDENT_AMBULATORY_CARE_PROVIDER_SITE_OTHER): Payer: Self-pay | Admitting: *Deleted

## 2017-12-31 ENCOUNTER — Telehealth (INDEPENDENT_AMBULATORY_CARE_PROVIDER_SITE_OTHER): Payer: Self-pay | Admitting: *Deleted

## 2017-12-31 DIAGNOSIS — D509 Iron deficiency anemia, unspecified: Secondary | ICD-10-CM

## 2017-12-31 NOTE — Telephone Encounter (Signed)
H&H is noted for 4 weeks. A letter has been sent to the patient as a reminder.

## 2017-12-31 NOTE — Telephone Encounter (Signed)
Patient needs H&H and Iron studies in 4 weeks

## 2018-01-01 NOTE — Telephone Encounter (Signed)
Pt returned my call, will increase lipitor to 80 mg daily

## 2018-01-04 ENCOUNTER — Encounter (HOSPITAL_COMMUNITY): Payer: Self-pay | Admitting: Internal Medicine

## 2018-01-11 DIAGNOSIS — H2512 Age-related nuclear cataract, left eye: Secondary | ICD-10-CM | POA: Diagnosis not present

## 2018-01-11 DIAGNOSIS — H4720 Unspecified optic atrophy: Secondary | ICD-10-CM | POA: Diagnosis not present

## 2018-01-11 DIAGNOSIS — H43392 Other vitreous opacities, left eye: Secondary | ICD-10-CM | POA: Diagnosis not present

## 2018-01-11 DIAGNOSIS — H43812 Vitreous degeneration, left eye: Secondary | ICD-10-CM | POA: Diagnosis not present

## 2018-01-26 DIAGNOSIS — D509 Iron deficiency anemia, unspecified: Secondary | ICD-10-CM | POA: Diagnosis not present

## 2018-01-27 LAB — IRON AND TIBC
IRON: 62 ug/dL (ref 38–169)
Iron Saturation: 20 % (ref 15–55)
Total Iron Binding Capacity: 316 ug/dL (ref 250–450)
UIBC: 254 ug/dL (ref 111–343)

## 2018-01-27 LAB — HEMOGLOBIN AND HEMATOCRIT, BLOOD
Hematocrit: 35.3 % — ABNORMAL LOW (ref 37.5–51.0)
Hemoglobin: 11.7 g/dL — ABNORMAL LOW (ref 13.0–17.7)

## 2018-01-27 LAB — FERRITIN: Ferritin: 141 ng/mL (ref 30–400)

## 2018-01-29 ENCOUNTER — Ambulatory Visit (INDEPENDENT_AMBULATORY_CARE_PROVIDER_SITE_OTHER): Payer: Medicare Other | Admitting: Internal Medicine

## 2018-01-29 ENCOUNTER — Encounter: Payer: Self-pay | Admitting: Internal Medicine

## 2018-01-29 VITALS — BP 120/58 | HR 59 | Ht 70.0 in | Wt 233.8 lb

## 2018-01-29 DIAGNOSIS — R590 Localized enlarged lymph nodes: Secondary | ICD-10-CM | POA: Diagnosis not present

## 2018-01-29 DIAGNOSIS — R591 Generalized enlarged lymph nodes: Secondary | ICD-10-CM

## 2018-01-29 DIAGNOSIS — I5043 Acute on chronic combined systolic (congestive) and diastolic (congestive) heart failure: Secondary | ICD-10-CM

## 2018-01-29 DIAGNOSIS — I6529 Occlusion and stenosis of unspecified carotid artery: Secondary | ICD-10-CM | POA: Diagnosis not present

## 2018-01-29 DIAGNOSIS — I10 Essential (primary) hypertension: Secondary | ICD-10-CM

## 2018-01-29 DIAGNOSIS — I481 Persistent atrial fibrillation: Secondary | ICD-10-CM

## 2018-01-29 DIAGNOSIS — I4819 Other persistent atrial fibrillation: Secondary | ICD-10-CM

## 2018-01-29 DIAGNOSIS — I5022 Chronic systolic (congestive) heart failure: Secondary | ICD-10-CM | POA: Diagnosis not present

## 2018-01-29 NOTE — Patient Instructions (Signed)
Medication Instructions:  Your physician recommends that you continue on your current medications as directed. Please refer to the Current Medication list given to you today.  Labwork: None ordered.  Testing/Procedures: Your physician has requested that you have an echocardiogram. Echocardiography is a painless test that uses sound waves to create images of your heart. It provides your doctor with information about the size and shape of your heart and how well your heart's chambers and valves are working. This procedure takes approximately one hour. There are no restrictions for this procedure.  Non-Cardiac CT scanning, (CAT scanning), is a noninvasive, special x-ray that produces cross-sectional images of the body using x-rays and a computer. CT scans help physicians diagnose and treat medical conditions. For some CT exams, a contrast material is used to enhance visibility in the area of the body being studied. CT scans provide greater clarity and reveal more details than regular x-ray exams.   Follow-Up: Your physician wants you to follow-up in: 6 months with Dr Rayann Heman. You will receive a reminder letter in the mail two months in advance. If you don't receive a letter, please call our office to schedule the follow-up appointment.   Any Other Special Instructions Will Be Listed Below (If Applicable).     If you need a refill on your cardiac medications before your next appointment, please call your pharmacy.

## 2018-01-29 NOTE — Progress Notes (Signed)
PCP: Asencion Noble, MD Primary Cardiologist: Dr Bronson Ing Primary EP: Dr Jethro Bolus is a 77 y.o. male who presents today for routine electrophysiology followup.  Since last being seen in our clinic, the patient reports doing very well.  Today, he denies symptoms of palpitations, chest pain, shortness of breath,  lower extremity edema, dizziness, presyncope, or syncope.  The patient is otherwise without complaint today.   Past Medical History:  Diagnosis Date  . Arthritis   . Basal cell carcinoma   . Carotid stenosis   . Coronary artery disease    Multvessel s/p CABG 2015  . Enlarged prostate   . Essential hypertension   . GERD (gastroesophageal reflux disease)   . Gout   . History of colon polyps   . History of kidney stones   . Hyperlipidemia   . Lumbar disc disease   . Persistent atrial fibrillation ([place])   . Sleep apnea    CPAP   Past Surgical History:  Procedure Laterality Date  . ABLATION OF DYSRHYTHMIC FOCUS  07/28/2017  . ATRIAL FIBRILLATION ABLATION N/A 07/28/2017   Procedure: ATRIAL FIBRILLATION ABLATION;  Surgeon: Thompson Grayer, MD;  Location: [facility];  Service: Cardiovascular;  Laterality: N/A;  . BACK SURGERY    . CARDIAC CATHETERIZATION  05/22/2014   Procedure: IABP INSERTION;  Surgeon: Leonie Man, MD;  Location: [facility] CATH LAB;  Service: Cardiovascular;;  . CARDIOVERSION N/A 04/29/2017   Procedure: CARDIOVERSION;  Surgeon: Satira Sark, MD;  Location: [facility];  Service: Cardiovascular;  Laterality: N/A;  . CARDIOVERSION N/A 08/13/2017   Procedure: CARDIOVERSION;  Surgeon: Pixie Casino, MD;  Location: [facility] ENDOSCOPY;  Service: Cardiovascular;  Laterality: N/A;  . Cataract surgery Right   . COLONOSCOPY    . COLONOSCOPY N/A 12/30/2017   Procedure: COLONOSCOPY;  Surgeon: Rogene Houston, MD;  Location: [facility];  Service: Endoscopy;  Laterality: N/A;  . CORONARY ARTERY BYPASS GRAFT N/A 05/22/2014   Procedure: CORONARY  ARTERY BYPASS GRAFTING (CABG) times three using left internal mammary and right saphenous vein.;  Surgeon: Melrose Nakayama, MD;  Location: [place];  Service: Open Heart Surgery;  Laterality: N/A;  . ENDARTERECTOMY Left 02/17/2014   Procedure: ENDARTERECTOMY CAROTID WITH PATCH ANGIOPLASTY;  Surgeon: Mal Misty, MD;  Location: [place];  Service: Vascular;  Laterality: Left;  . ESOPHAGEAL DILATION N/A 08/22/2015   Procedure: ESOPHAGEAL DILATION;  Surgeon: Rogene Houston, MD;  Location: [facility];  Service: Endoscopy;  Laterality: N/A;  . ESOPHAGOGASTRODUODENOSCOPY N/A 08/22/2015   Procedure: ESOPHAGOGASTRODUODENOSCOPY (EGD);  Surgeon: Rogene Houston, MD;  [facility]: [facility];  Service: Endoscopy;  Laterality: N/A;  12:45 - moved to 1:55 - Ann notified pt  . ESOPHAGOGASTRODUODENOSCOPY N/A 12/30/2017   Procedure: ESOPHAGOGASTRODUODENOSCOPY (EGD);  Surgeon: Rogene Houston, MD;  Location: [facility];  Service: Endoscopy;  Laterality: N/A;  200  . EYE SURGERY     cataract extraction (right) with repair macular tear , with IOL     right  . FRACTURE SURGERY     bilateral wrist fractures- one ORIF  . JOINT REPLACEMENT  2011   left knee  . LEFT HEART CATHETERIZATION WITH CORONARY ANGIOGRAM N/A 05/22/2014   Procedure: LEFT HEART CATHETERIZATION WITH CORONARY ANGIOGRAM;  Surgeon: Leonie Man, MD;  Location: [facility] CATH LAB;  Service: Cardiovascular;  Laterality: N/A;  . LITHOTRIPSY    . PARS PLANA VITRECTOMY W/ REPAIR OF MACULAR HOLE    .  RHINOPLASTY    . TEE WITHOUT CARDIOVERSION N/A 04/29/2017   Procedure: TRANSESOPHAGEAL ECHOCARDIOGRAM (TEE) WITH PROPOFOL;  Surgeon: Satira Sark, MD;  Location: [facility];  Service: Cardiovascular;  Laterality: N/A;  . TONSILLECTOMY    . TOTAL HIP ARTHROPLASTY  12/08/2011   Procedure: TOTAL HIP ARTHROPLASTY;  Surgeon: Gearlean Alf, MD;  Location: [facility];  Service: Orthopedics;  Laterality: Right;  . UPPER GI ENDOSCOPY  12/18/2015   Procedure:  UPPER GI ENDOSCOPY;  Surgeon: Ralene Ok, MD;  Location: [facility] ORS;  Service: General;;  . WRIST SURGERY Right 73yrs ago  . WRIST SURGERY Left     ROS- all systems are reviewed and negatives except as per HPI above  Current Outpatient Medications  Medication Sig Dispense Refill  . atorvastatin (LIPITOR) 80 MG tablet Take 1 tablet (80 mg total) by mouth every evening. 90 tablet 3  . colchicine 0.6 MG tablet Take 0.6 mg by mouth daily as needed (for gout flare up).     . Cyanocobalamin (B-12) 1000 MCG SUBL Place 1,000 mcg under the tongue daily.     . cyclobenzaprine (FLEXERIL) 10 MG tablet Take 10 mg by mouth at bedtime as needed for muscle spasms  1  . doxazosin (CARDURA) 4 MG tablet Take 4 mg by mouth daily.  4  . finasteride (PROSCAR) 5 MG tablet Take 5 mg by mouth every evening.     . furosemide (LASIX) 40 MG tablet Take 1 tablet (40 mg total) by mouth 2 (two) times daily. 180 tablet 1  . Iron-Vitamin C (VITRON-C) 65-125 MG TABS Take 1 tablet by mouth daily.    Marland Kitchen lisinopril (PRINIVIL,ZESTRIL) 40 MG tablet Take 40 mg by mouth daily.    . nitroGLYCERIN (NITROSTAT) 0.4 MG SL tablet PLACE 1 TABLET UNDER THE TONGUE EVERY 5 MINUTES AS NEEDED FOR CHEST PAIN (Patient taking differently: Place 0.4 mg under the tongue every 5 (five) minutes as needed for chest pain. ) 25 tablet 3  . pantoprazole (PROTONIX) 40 MG tablet Take 1 tablet (40 mg total) by mouth daily before breakfast. (Patient taking differently: Take 40 mg by mouth every evening. ) 90 tablet 3  . potassium chloride SA (KLOR-CON M20) 20 MEQ tablet Take 1 tablet (20 mEq total) by mouth 2 (two) times daily. 180 tablet 0  . rivaroxaban (XARELTO) 20 MG TABS tablet Take 1 tablet (20 mg total) by mouth daily. 30 tablet 11   No current facility-administered medications for this visit.     Physical Exam: Vitals:   01/29/18 1015  BP: (!) 120/58  Pulse: (!) 59  Weight: 233 lb 12.8 oz (106.1 kg)  Height: 5\' 10"  (1.778 m)    GEN- The  patient is well appearing, alert and oriented x 3 today.   Head- normocephalic, atraumatic Eyes-  Sclera clear, conjunctiva pink Ears- hearing intact Oropharynx- clear Lungs- Clear to ausculation bilaterally, normal work of breathing Heart- Regular rate and rhythm, no murmurs, rubs or gallops, PMI not laterally displaced GI- soft, NT, ND, + BS Extremities- no clubbing, cyanosis, or edema  Wt Readings from Last 3 Encounters:  01/29/18 233 lb 12.8 oz (106.1 kg)  12/30/17 224 lb (101.6 kg)  11/25/17 229 lb (103.9 kg)    EKG tracing ordered today is personally reviewed and shows sinus rhythm 59 bpm, PR 160 msec, QRS 86 msec, Qtc 425 msec  Assessment and Plan:  1. Persistent afib Maintaining sinus post ablation off AAD therapy Very pleased with current state Amiodarone was  stopped by Dr Bronson Ing chads2vasc score is 4.  Continue xarelto  2. OSA Compliance with CPAP  Encouraged  3. Obesity Body mass index is 33.55 kg/m. Lifestyle modification encouraged He has made some improvement  4. Chronic systolic dysfunction EF 70% prior to ablation Repeat echo to see if EF has recovered Dr Bronson Ing to continue to manage  5. CAD No ischemc symptoms  6. HTN Stable No change required today  7. Prior chest lymphadenopathy/ pleural effusion Seen on CT Thoracentesis performed with relief of symptoms Cytology and other studies were unremarkable Hopefully due to CHF.  I would like to repeat study in 3 months  Follow-up with Dr Bronson Ing as scheduled Return in 6 months  Thompson Grayer MD, [facility] 01/29/2018 10:46 AM

## 2018-02-04 ENCOUNTER — Ambulatory Visit (HOSPITAL_COMMUNITY)
Admission: RE | Admit: 2018-02-04 | Discharge: 2018-02-04 | Disposition: A | Discharge status: Home / Self Care | Payer: Medicare Other | Source: Ambulatory Visit | Attending: Internal Medicine | Admitting: Internal Medicine

## 2018-02-04 DIAGNOSIS — I4819 Other persistent atrial fibrillation: Secondary | ICD-10-CM

## 2018-02-04 DIAGNOSIS — Z951 Presence of aortocoronary bypass graft: Secondary | ICD-10-CM | POA: Insufficient documentation

## 2018-02-04 DIAGNOSIS — I6529 Occlusion and stenosis of unspecified carotid artery: Secondary | ICD-10-CM | POA: Diagnosis not present

## 2018-02-04 DIAGNOSIS — I5043 Acute on chronic combined systolic (congestive) and diastolic (congestive) heart failure: Secondary | ICD-10-CM | POA: Diagnosis not present

## 2018-02-04 DIAGNOSIS — I481 Persistent atrial fibrillation: Secondary | ICD-10-CM | POA: Insufficient documentation

## 2018-02-04 DIAGNOSIS — I110 Hypertensive heart disease with heart failure: Secondary | ICD-10-CM | POA: Diagnosis not present

## 2018-02-04 DIAGNOSIS — G4733 Obstructive sleep apnea (adult) (pediatric): Secondary | ICD-10-CM | POA: Insufficient documentation

## 2018-02-04 DIAGNOSIS — I252 Old myocardial infarction: Secondary | ICD-10-CM | POA: Diagnosis not present

## 2018-02-04 DIAGNOSIS — I5022 Chronic systolic (congestive) heart failure: Secondary | ICD-10-CM

## 2018-02-04 DIAGNOSIS — I081 Rheumatic disorders of both mitral and tricuspid valves: Secondary | ICD-10-CM | POA: Insufficient documentation

## 2018-02-04 NOTE — Progress Notes (Signed)
*  PRELIMINARY RESULTS* Echocardiogram 2D Echocardiogram has been performed.  Caleb Wolf 02/04/2018, 12:07 PM

## 2018-02-08 ENCOUNTER — Encounter (HOSPITAL_COMMUNITY): Payer: Self-pay | Admitting: Physical Therapy

## 2018-02-08 DIAGNOSIS — N200 Calculus of kidney: Secondary | ICD-10-CM | POA: Diagnosis not present

## 2018-02-08 DIAGNOSIS — N3941 Urge incontinence: Secondary | ICD-10-CM | POA: Diagnosis not present

## 2018-02-08 DIAGNOSIS — N401 Enlarged prostate with lower urinary tract symptoms: Secondary | ICD-10-CM | POA: Diagnosis not present

## 2018-02-08 DIAGNOSIS — N528 Other male erectile dysfunction: Secondary | ICD-10-CM | POA: Diagnosis not present

## 2018-02-08 DIAGNOSIS — E291 Testicular hypofunction: Secondary | ICD-10-CM | POA: Diagnosis not present

## 2018-02-08 DIAGNOSIS — N312 Flaccid neuropathic bladder, not elsewhere classified: Secondary | ICD-10-CM | POA: Diagnosis not present

## 2018-03-03 ENCOUNTER — Other Ambulatory Visit: Payer: Self-pay | Admitting: Cardiovascular Disease

## 2018-03-08 DIAGNOSIS — Z79899 Other long term (current) drug therapy: Secondary | ICD-10-CM | POA: Diagnosis not present

## 2018-03-08 DIAGNOSIS — I5022 Chronic systolic (congestive) heart failure: Secondary | ICD-10-CM | POA: Diagnosis not present

## 2018-03-08 DIAGNOSIS — M109 Gout, unspecified: Secondary | ICD-10-CM | POA: Diagnosis not present

## 2018-03-08 DIAGNOSIS — E785 Hyperlipidemia, unspecified: Secondary | ICD-10-CM | POA: Diagnosis not present

## 2018-03-08 DIAGNOSIS — I2510 Atherosclerotic heart disease of native coronary artery without angina pectoris: Secondary | ICD-10-CM | POA: Diagnosis not present

## 2018-03-08 DIAGNOSIS — I10 Essential (primary) hypertension: Secondary | ICD-10-CM | POA: Diagnosis not present

## 2018-03-08 DIAGNOSIS — I480 Paroxysmal atrial fibrillation: Secondary | ICD-10-CM | POA: Diagnosis not present

## 2018-03-15 DIAGNOSIS — I5022 Chronic systolic (congestive) heart failure: Secondary | ICD-10-CM | POA: Diagnosis not present

## 2018-03-15 DIAGNOSIS — D649 Anemia, unspecified: Secondary | ICD-10-CM | POA: Diagnosis not present

## 2018-03-15 DIAGNOSIS — Z23 Encounter for immunization: Secondary | ICD-10-CM | POA: Diagnosis not present

## 2018-03-15 DIAGNOSIS — I480 Paroxysmal atrial fibrillation: Secondary | ICD-10-CM | POA: Diagnosis not present

## 2018-03-19 ENCOUNTER — Encounter (INDEPENDENT_AMBULATORY_CARE_PROVIDER_SITE_OTHER): Payer: Self-pay | Admitting: *Deleted

## 2018-03-19 ENCOUNTER — Other Ambulatory Visit (INDEPENDENT_AMBULATORY_CARE_PROVIDER_SITE_OTHER): Payer: Self-pay | Admitting: *Deleted

## 2018-03-19 DIAGNOSIS — D649 Anemia, unspecified: Secondary | ICD-10-CM

## 2018-03-19 DIAGNOSIS — K220 Achalasia of cardia: Secondary | ICD-10-CM

## 2018-03-23 IMAGING — CT CT HEART MORPH/PULM VEIN W/ CM & W/O CA SCORE
2 of 6 series · 10 of 20 positions shown, 12 images · IV contrast (APPLIED)
Comparison: None.

EXAM:
OVER-READ INTERPRETATION  CT CHEST

The following report is an over-read performed by radiologist Dr.
Molaei Junek [REDACTED] on 07/23/2017. This over-read
does not include interpretation of cardiac or coronary anatomy or
pathology. The coronary CTA interpretation by the cardiologist is
attached.
CLINICAL DATA: 76-year-old male with persistent atrial fibrillation
scheduled for an ablation.
Cardiac CT/CTA
TECHNIQUE: The patient was scanned on a Siemens Somatom scanner.

[Series 6: best diast · axial · 0.38mm/px · z∈[+986,+1091]mm · 5 of 395 slices shown, 7 images]
[im 66/395  vessel]
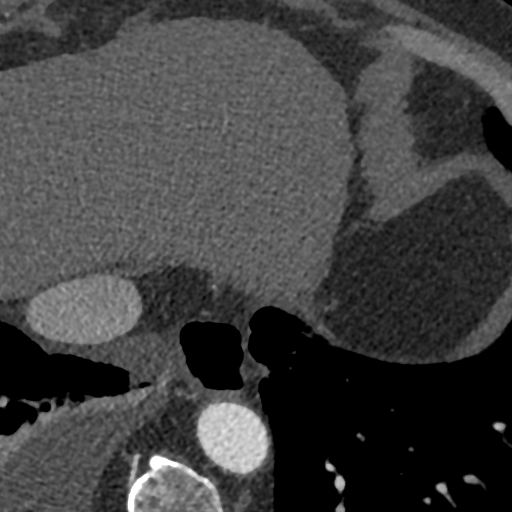
[im 66/395  lung]
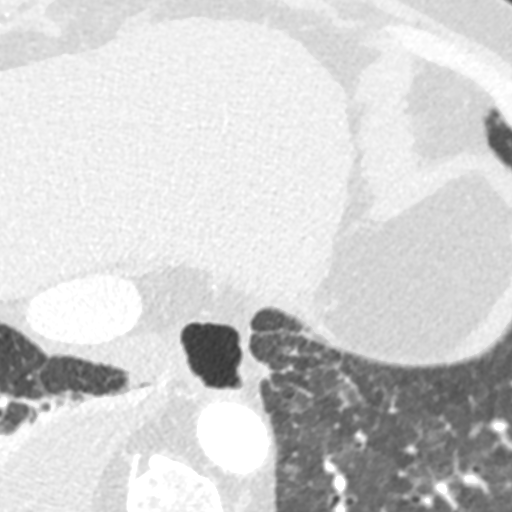
[im 132/395  vessel]
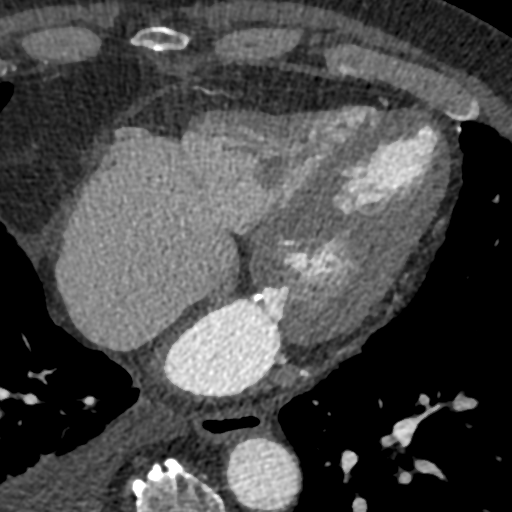
[im 198/395  vessel]
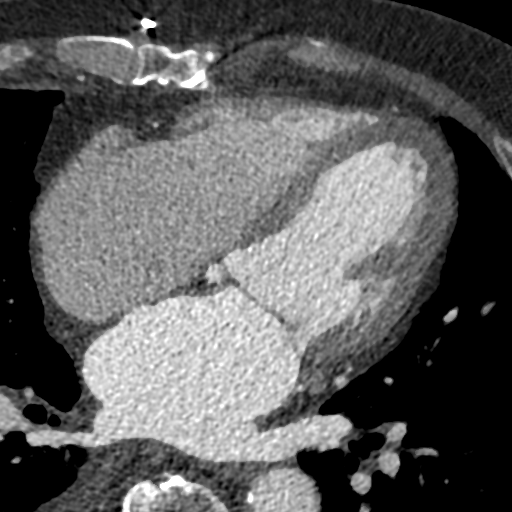
[im 263/395  vessel]
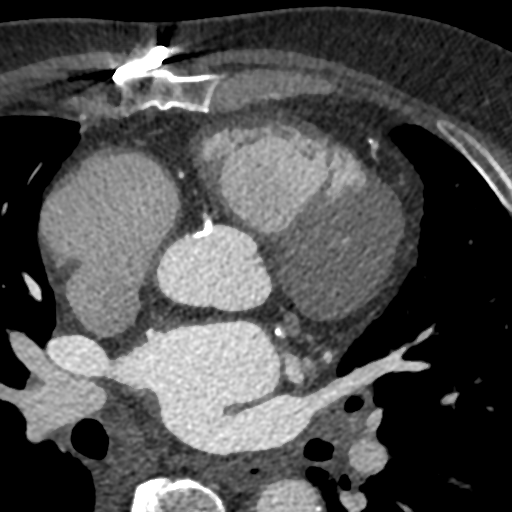
[im 329/395  vessel]
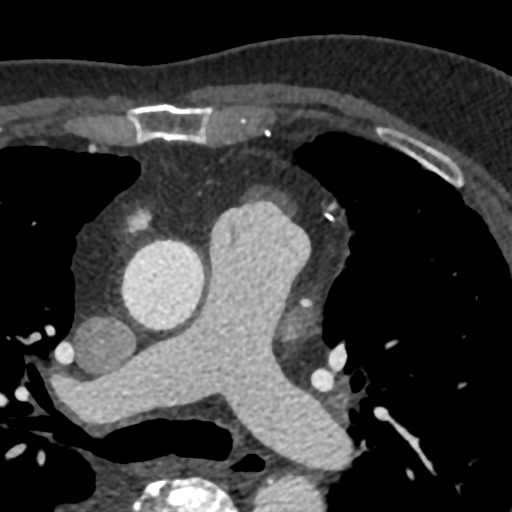
[im 329/395  lung]
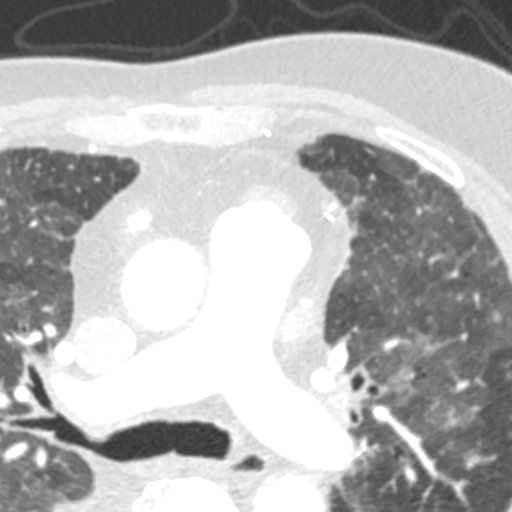

[Series 7: +300 ms · axial · 0.38mm/px · z∈[+986,+1091]mm · 5 of 395 slices shown]
[im 66/395  vessel]
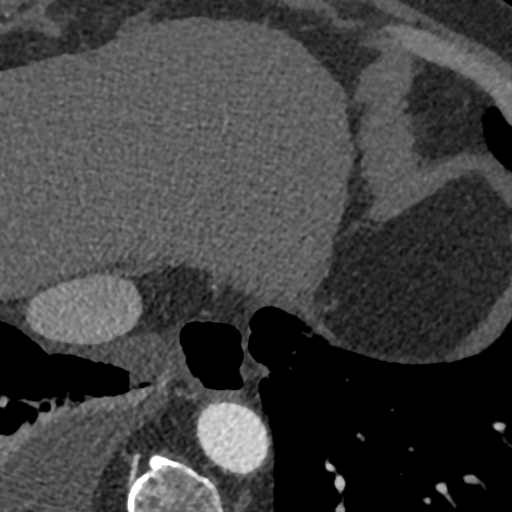
[im 132/395  vessel]
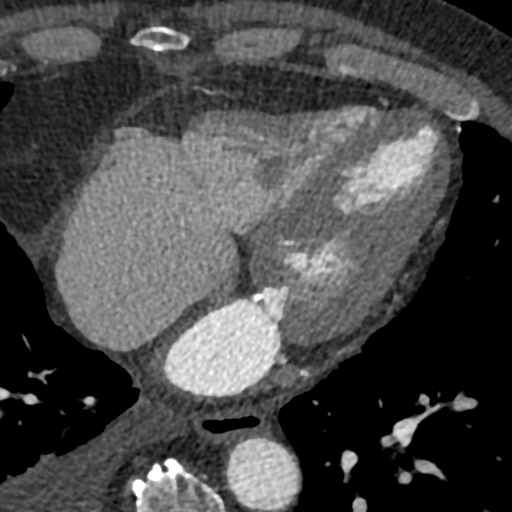
[im 198/395  vessel]
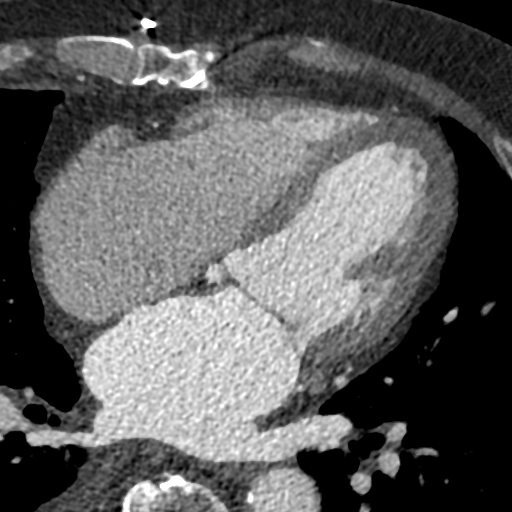
[im 263/395  vessel]
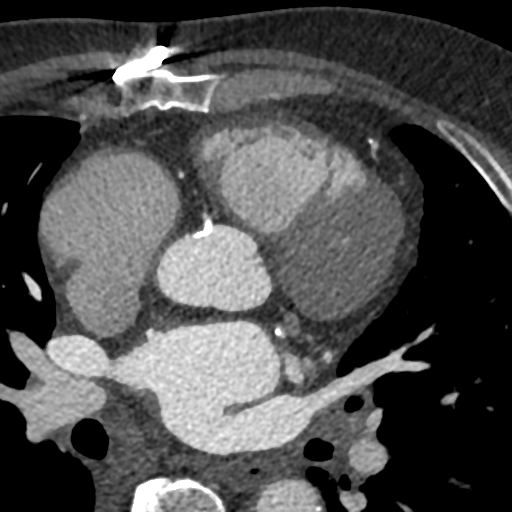
[im 329/395  vessel]
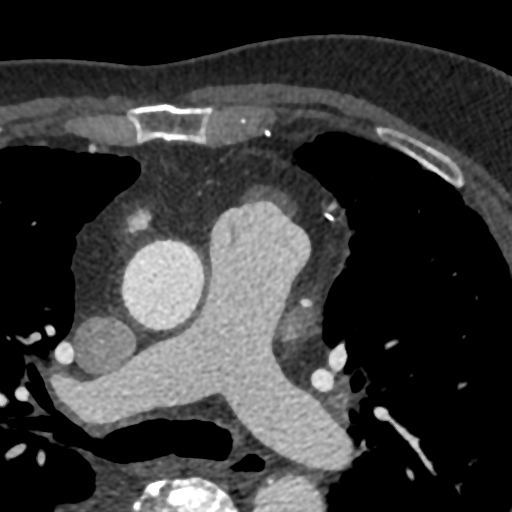

[10 of 20 positions shown; findings below may reference images not displayed]

FINDINGS: Cardiomegaly.  Visualized aorta is normal caliber.

Mild mediastinal adenopathy. Prevascular lymph node has a short axis
diameter of 10 mm on image 1. Fall right AP window lymph node has a
short axis diameter of 10 mm on image 4. Right subcarinal lymph node
has a short axis diameter of 9 mm on image 18.

Moderate right pleural effusion with compressive atelectasis in the
right lower lobe. Ground-glass opacities in the lungs could reflect
edema, atelectasis or alveolitis.

Imaging into the upper abdomen shows no acute findings. Chest wall
soft tissues are unremarkable. No acute bony abnormality.
IMPRESSION: Mild mediastinal adenopathy. This could be reactive or related to
metastatic disease or lymphoproliferative disorder such as lymphoma.
Recommend follow-up chest CT in 3-6 months to assess stability.

Moderate right pleural effusion. Compressive atelectasis in the
right lower lobe.

Ground-glass opacities in the lungs could reflect edema, atelectasis
or alveolitis.
FINDINGS: A 120 kV prospective scan was triggered in the descending thoracic
aorta at 111 HU's. Gantry rotation speed was 280 msecs and
collimation was .9 mm. 10 mg of iv Metoprolol and no NTG was given.
The 3D data set was reconstructed in 5% intervals of the 60-80 % of
the R-R cycle. Diastolic phases were analyzed on a dedicated work
station using MPR, MIP and VRT modes. The patient received 80 cc of
contrast.

There is normal pulmonary vein drainage into the left atrium (2 on
the right and 2 on the left) with ostial measurements as follows:

RUPV: 30 x 18 mm

RLPV: 23 x 18 mm

LUPV: 24 x 15 mm

LLPV: 19 x 16 mm

The left atrial appendage is medium size with chicken wing
morphology and one major lobe. Ostial size 25 x 23 mm and length 38
mm. There is no thrombus in the left atrial appendage.

The esophagus runs to the left from the left atrial midline and is
in the proximity to the LUPV and LLPV.

Aorta: Normal caliber. No dissection, mild diffuse calcifications
and atherosclerotic plaque.

Aortic Valve:  Trileaflet.  No calcifications.
IMPRESSION: 1. There is normal pulmonary vein drainage into the left atrium.

2. The left atrial appendage is medium size with chicken wing
morphology and one major lobe. Ostial size 25 x 23 mm and length 38
mm. There is no thrombus in the left atrial appendage.

3. The esophagus runs to the left from the left atrial midline and
is in the proximity to the LUPV and LLPV.

## 2018-03-26 DIAGNOSIS — D649 Anemia, unspecified: Secondary | ICD-10-CM | POA: Diagnosis not present

## 2018-03-26 DIAGNOSIS — K220 Achalasia of cardia: Secondary | ICD-10-CM | POA: Diagnosis not present

## 2018-03-27 LAB — HEMOGLOBIN AND HEMATOCRIT, BLOOD
HEMATOCRIT: 34.5 % — AB (ref 37.5–51.0)
HEMOGLOBIN: 11.4 g/dL — AB (ref 13.0–17.7)

## 2018-03-29 ENCOUNTER — Other Ambulatory Visit (INDEPENDENT_AMBULATORY_CARE_PROVIDER_SITE_OTHER): Payer: Self-pay | Admitting: *Deleted

## 2018-03-29 DIAGNOSIS — K220 Achalasia of cardia: Secondary | ICD-10-CM

## 2018-03-29 DIAGNOSIS — D649 Anemia, unspecified: Secondary | ICD-10-CM

## 2018-04-08 ENCOUNTER — Ambulatory Visit (INDEPENDENT_AMBULATORY_CARE_PROVIDER_SITE_OTHER): Payer: Medicare Other | Admitting: Family

## 2018-04-08 ENCOUNTER — Encounter: Payer: Self-pay | Admitting: Family

## 2018-04-08 ENCOUNTER — Ambulatory Visit (HOSPITAL_COMMUNITY)
Admission: RE | Admit: 2018-04-08 | Discharge: 2018-04-08 | Disposition: A | Discharge status: Home / Self Care | Payer: Medicare Other | Source: Ambulatory Visit | Attending: Internal Medicine | Admitting: Internal Medicine

## 2018-04-08 ENCOUNTER — Other Ambulatory Visit: Payer: Self-pay

## 2018-04-08 VITALS — BP 151/63 | HR 55 | Temp 97.3°F | Resp 18 | Ht 70.0 in | Wt 236.0 lb

## 2018-04-08 DIAGNOSIS — Z9889 Other specified postprocedural states: Secondary | ICD-10-CM | POA: Diagnosis not present

## 2018-04-08 DIAGNOSIS — I6521 Occlusion and stenosis of right carotid artery: Secondary | ICD-10-CM

## 2018-04-08 DIAGNOSIS — I6522 Occlusion and stenosis of left carotid artery: Secondary | ICD-10-CM

## 2018-04-08 DIAGNOSIS — I6529 Occlusion and stenosis of unspecified carotid artery: Secondary | ICD-10-CM

## 2018-04-08 NOTE — Progress Notes (Signed)
Chief Complaint: Follow up Extracranial Carotid Artery Stenosis   History of Present Illness  Caleb Wolf is a 77 y.o. male who is s/p left carotid endarterectomy on 02-17-14 by Dr. Kellie Simmering.  He has a known right ICA occlusion.  He had a resection of a redundant segment of the left internal carotid artery with primary reanastomosis. He has had no neurologic symptoms since Dr. Kellie Simmering performed his surgery in September 2015 in conjunction with coronary artery bypass grafting.   He continues to take one aspirin per day. He denies lower extremity claudication symptoms.  He denies any known history of stroke or TIA. Specifically he deniesa history of amaurosis fugax or monocular blindness, unilateral facial drooping, hemiplegia, orreceptive or expressive aphasia.   Pt states his systolic blood pressure is usually in the 130's.    Diabetic: no Tobacco use: former smoker, quit in 1984, smoked x 25 years  Pt meds include: Statin : yes ASA: no Other anticoagulants/antiplatelets: Xarelto, hx of atrial fib, he had a cardiac ablation and cardioversion in February and March 2019  He feels much improved in a regular rhythm.    Past Medical History:  Diagnosis Date  . Arthritis   . Basal cell carcinoma   . Carotid stenosis   . Coronary artery disease    Multvessel s/p CABG 2015  . Enlarged prostate   . Essential hypertension   . GERD (gastroesophageal reflux disease)   . Gout   . History of colon polyps   . History of kidney stones   . Hyperlipidemia   . Lumbar disc disease   . Persistent atrial fibrillation   . Sleep apnea    CPAP    Social History Social History   Tobacco Use  . Smoking status: Former Smoker    Packs/day: 1.50    Years: 25.00    Pack years: 37.50    Types: Cigarettes    Start date: 08/08/1960    Last attempt to quit: 06/02/1982    Years since quitting: 35.8  . Smokeless tobacco: Never Used  . Tobacco comment: quit smoking 30+yrs ago  Substance Use  Topics  . Alcohol use: No    Alcohol/week: 0.0 standard drinks    Comment: occasionally   . Drug use: No    Family History Family History  Problem Relation Age of Onset  . Allergies Mother   . Heart disease Mother   . Hypertension Mother   . Heart disease Father        MI    Surgical History Past Surgical History:  Procedure Laterality Date  . ABLATION OF DYSRHYTHMIC FOCUS  07/28/2017  . ATRIAL FIBRILLATION ABLATION N/A 07/28/2017   Procedure: ATRIAL FIBRILLATION ABLATION;  Surgeon: Thompson Grayer, MD;  Location: [place] [facility];  Service: Cardiovascular;  Laterality: N/A;  . BACK SURGERY    . CARDIAC CATHETERIZATION  05/22/2014   Procedure: IABP INSERTION;  Surgeon: Leonie Man, MD;  Location: [facility] CATH LAB;  Service: Cardiovascular;;  . CARDIOVERSION N/A 04/29/2017   Procedure: CARDIOVERSION;  Surgeon: Satira Sark, MD;  [facility];  Service: Cardiovascular;  Laterality: N/A;  . CARDIOVERSION N/A 08/13/2017   Procedure: CARDIOVERSION;  Surgeon: Pixie Casino, MD;  Location: [facility] ENDOSCOPY;  Service: Cardiovascular;  Laterality: N/A;  . Cataract surgery Right   . COLONOSCOPY    . COLONOSCOPY N/A 12/30/2017   Procedure: COLONOSCOPY;  Surgeon: Rogene Houston, MD;  [facility];  Service: Endoscopy;  Laterality: N/A;  .  CORONARY ARTERY BYPASS GRAFT N/A 05/22/2014   Procedure: CORONARY ARTERY BYPASS GRAFTING (CABG) times three using left internal mammary and right saphenous vein.;  Surgeon: Melrose Nakayama, MD;  Location: [place];  Service: Open Heart Surgery;  Laterality: N/A;  . ENDARTERECTOMY Left 02/17/2014   Procedure: ENDARTERECTOMY CAROTID WITH PATCH ANGIOPLASTY;  Surgeon: Mal Misty, MD;  Location: [place];  Service: Vascular;  Laterality: Left;  . ESOPHAGEAL DILATION N/A 08/22/2015   Procedure: ESOPHAGEAL DILATION;  Surgeon: Rogene Houston, MD;  [facility];  Service: Endoscopy;  Laterality: N/A;  .  ESOPHAGOGASTRODUODENOSCOPY N/A 08/22/2015   Procedure: ESOPHAGOGASTRODUODENOSCOPY (EGD);  Surgeon: Rogene Houston, MD;  [facility];  Service: Endoscopy;  Laterality: N/A;  12:45 - moved to 1:55 - Ann notified pt  . ESOPHAGOGASTRODUODENOSCOPY N/A 12/30/2017   Procedure: ESOPHAGOGASTRODUODENOSCOPY (EGD);  Surgeon: Rogene Houston, MD;  [facility];  Service: Endoscopy;  Laterality: N/A;  200  . EYE SURGERY     cataract extraction (right) with repair macular tear , with IOL     right  . FRACTURE SURGERY     bilateral wrist fractures- one ORIF  . JOINT REPLACEMENT  2011   left knee  . LEFT HEART CATHETERIZATION WITH CORONARY ANGIOGRAM N/A 05/22/2014   Procedure: LEFT HEART CATHETERIZATION WITH CORONARY ANGIOGRAM;  Surgeon: Leonie Man, MD;  Location: [facility] CATH LAB;  Service: Cardiovascular;  Laterality: N/A;  . LITHOTRIPSY    . PARS PLANA VITRECTOMY W/ REPAIR OF MACULAR HOLE    . RHINOPLASTY    . TEE WITHOUT CARDIOVERSION N/A 04/29/2017   Procedure: TRANSESOPHAGEAL ECHOCARDIOGRAM (TEE) WITH PROPOFOL;  Surgeon: Satira Sark, MD;  [facility];  Service: Cardiovascular;  Laterality: N/A;  . TONSILLECTOMY    . TOTAL HIP ARTHROPLASTY  12/08/2011   Procedure: TOTAL HIP ARTHROPLASTY;  Surgeon: Gearlean Alf, MD;  Location: [facility];  Service: Orthopedics;  Laterality: Right;  . UPPER GI ENDOSCOPY  12/18/2015   Procedure: UPPER GI ENDOSCOPY;  Surgeon: Ralene Ok, MD;  Location: [facility];  Service: General;;  . WRIST SURGERY Right 41yrs ago  . WRIST SURGERY Left     Allergies  Allergen Reactions  . Codeine Other (See Comments)  . Codeine Sulfate Nausea Only    Current Outpatient Medications  Medication Sig Dispense Refill  . atorvastatin (LIPITOR) 40 MG tablet TAKE 1 TABLET BY MOUTH IN THE EVENING 90 tablet 2  . colchicine 0.6 MG tablet Take 0.6 mg by mouth daily as needed (for gout flare up).     . Cyanocobalamin (B-12) 1000 MCG SUBL Place 1,000  mcg under the tongue daily.     . cyclobenzaprine (FLEXERIL) 10 MG tablet Take 10 mg by mouth at bedtime as needed for muscle spasms  1  . doxazosin (CARDURA) 4 MG tablet Take 4 mg by mouth daily.  4  . finasteride (PROSCAR) 5 MG tablet Take 5 mg by mouth every evening.     . furosemide (LASIX) 40 MG tablet Take 1 tablet (40 mg total) by mouth 2 (two) times daily. 180 tablet 1  . Iron-Vitamin C (VITRON-C) 65-125 MG TABS Take 1 tablet by mouth daily.    Marland Kitchen lisinopril (PRINIVIL,ZESTRIL) 40 MG tablet Take 40 mg by mouth daily.    . nitroGLYCERIN (NITROSTAT) 0.4 MG SL tablet PLACE 1 TABLET UNDER THE TONGUE EVERY 5 MINUTES AS NEEDED FOR CHEST PAIN (Patient taking differently: Place 0.4 mg under the tongue every 5 (  five) minutes as needed for chest pain. ) 25 tablet 3  . pantoprazole (PROTONIX) 40 MG tablet Take 1 tablet (40 mg total) by mouth daily before breakfast. (Patient taking differently: Take 40 mg by mouth every evening. ) 90 tablet 3  . potassium chloride SA (KLOR-CON M20) 20 MEQ tablet Take 1 tablet (20 mEq total) by mouth 2 (two) times daily. 180 tablet 0  . rivaroxaban (XARELTO) 20 MG TABS tablet Take 1 tablet (20 mg total) by mouth daily. 30 tablet 11  . atorvastatin (LIPITOR) 80 MG tablet Take 1 tablet (80 mg total) by mouth every evening. (Patient not taking: Reported on 04/08/2018) 90 tablet 3   No current facility-administered medications for this visit.     Review of Systems : See HPI for pertinent positives and negatives.  Physical Examination  Vitals:   04/08/18 1033 04/08/18 1038  BP: (!) 144/58 (!) 151/63  Pulse: (!) 55 (!) 55  Resp: 18   Temp: (!) 97.3 F (36.3 C)   TempSrc: Oral   SpO2: 100%   Weight: 236 lb (107 kg)   Height: 5\' 10"  (1.778 m)    Body mass index is 33.86 kg/m.  General: WDWN obese malein NAD GAIT:normal Eyes: PERRLA Pulmonary: Respirations are non-labored, good air movement, CTAB.  Cardiac: regularrhythm, nodetected murmur.  VASCULAR  EXAM Carotid Bruits Right Left   Negative Negative   Abdominal aortic pulse is notpalpable. Radial pulses: not palpable on the right, right brachial is 2+ palpable, left is 1+palpable  LE Pulses Right Left  POPLITEAL notpalpable  notpalpable  POSTERIOR TIBIAL notpalpable  palpable   DORSALIS PEDIS ANTERIOR TIBIAL faintlypalpable  Faintlypalpable     Gastrointestinal: soft, nontender, BS WNL, no r/g, nopalpable masses.  Musculoskeletal: nomuscle atrophy/wasting. M/S 5/5 throughout, extremities without ischemic changes. Left lower leg and ankle with 1-2+ pitting edema, right is trace to 1+ pitting edema.   Skin: No cellulitis, no rash, no ulcers.   Neurologic: A&O X 3; Appropriate Affect, Speech is normal, CN 2-12 intact, pain and light touch intact in extremities, Motor exam as listed above. Psychiatric: Normal thought content, mood appropriate to clinical situation.    Assessment: Caleb Wolf is a 77 y.o. male who is s/p resection of a redundant segment of the left internal carotid artery with primary reanastomosis in September 2015 in conjunction with coronary artery bypass grafting.  He has no history of stroke or TIA.   DATA Carotid Duplex (04-08-18):  Confirmed occlusion of right ICA, occlusion of right CCA. Left CEA site with 1-39% stenosis. Bilateral vertebral arteries are antegrade. Bilateral subclavian arteries are triphasic. No significant change compared to the exams on 03/20/15, 03-25-16, and 03-31-17.   Plan:  Follow-up in 1year with Carotid Duplex scan.  I discussed in depth with the patient the nature of atherosclerosis, and emphasized the importance of maximal medical management including strict control of blood pressure, blood glucose, and lipid levels, obtaining regular exercise, and continued cessation of smoking.  The patient is aware that without maximal medical management the  underlying atherosclerotic disease process will progress, limiting the benefit of any interventions. The patient was given information about stroke prevention and what symptoms should prompt the patient to seek immediate medical care. Thank you for allowing [place] to participate in this patient's care.  Clemon Chambers, RN, MSN, FNP-C [facility] Office: [phone]  Clinic Physician: Oneida Alar  04/08/18 10:51 AM

## 2018-04-08 NOTE — Patient Instructions (Signed)

## 2018-04-13 ENCOUNTER — Ambulatory Visit (HOSPITAL_COMMUNITY)
Admission: RE | Admit: 2018-04-13 | Discharge: 2018-04-13 | Disposition: A | Discharge status: Home / Self Care | Payer: Medicare Other | Source: Ambulatory Visit | Attending: Internal Medicine | Admitting: Internal Medicine

## 2018-04-13 DIAGNOSIS — R918 Other nonspecific abnormal finding of lung field: Secondary | ICD-10-CM | POA: Insufficient documentation

## 2018-04-13 DIAGNOSIS — I700 Atherosclerosis of aorta: Secondary | ICD-10-CM | POA: Diagnosis not present

## 2018-04-13 DIAGNOSIS — R590 Localized enlarged lymph nodes: Secondary | ICD-10-CM | POA: Diagnosis not present

## 2018-04-13 DIAGNOSIS — I2510 Atherosclerotic heart disease of native coronary artery without angina pectoris: Secondary | ICD-10-CM | POA: Diagnosis not present

## 2018-04-13 DIAGNOSIS — R591 Generalized enlarged lymph nodes: Secondary | ICD-10-CM

## 2018-04-13 DIAGNOSIS — K319 Disease of stomach and duodenum, unspecified: Secondary | ICD-10-CM | POA: Diagnosis not present

## 2018-05-18 DIAGNOSIS — D485 Neoplasm of uncertain behavior of skin: Secondary | ICD-10-CM | POA: Diagnosis not present

## 2018-05-18 DIAGNOSIS — L821 Other seborrheic keratosis: Secondary | ICD-10-CM | POA: Diagnosis not present

## 2018-05-18 DIAGNOSIS — L570 Actinic keratosis: Secondary | ICD-10-CM | POA: Diagnosis not present

## 2018-06-14 ENCOUNTER — Encounter: Payer: Self-pay | Admitting: Internal Medicine

## 2018-06-14 DIAGNOSIS — I480 Paroxysmal atrial fibrillation: Secondary | ICD-10-CM | POA: Diagnosis not present

## 2018-06-14 DIAGNOSIS — D649 Anemia, unspecified: Secondary | ICD-10-CM | POA: Diagnosis not present

## 2018-06-14 DIAGNOSIS — N183 Chronic kidney disease, stage 3 (moderate): Secondary | ICD-10-CM | POA: Diagnosis not present

## 2018-06-14 DIAGNOSIS — I5022 Chronic systolic (congestive) heart failure: Secondary | ICD-10-CM | POA: Diagnosis not present

## 2018-06-14 DIAGNOSIS — Z79899 Other long term (current) drug therapy: Secondary | ICD-10-CM | POA: Diagnosis not present

## 2018-06-17 ENCOUNTER — Encounter: Payer: Self-pay | Admitting: Cardiovascular Disease

## 2018-06-17 ENCOUNTER — Ambulatory Visit (INDEPENDENT_AMBULATORY_CARE_PROVIDER_SITE_OTHER): Payer: Medicare Other | Admitting: Cardiovascular Disease

## 2018-06-17 VITALS — BP 136/57 | HR 59 | Ht 70.0 in | Wt 242.0 lb

## 2018-06-17 DIAGNOSIS — Z9889 Other specified postprocedural states: Secondary | ICD-10-CM

## 2018-06-17 DIAGNOSIS — E785 Hyperlipidemia, unspecified: Secondary | ICD-10-CM

## 2018-06-17 DIAGNOSIS — I4819 Other persistent atrial fibrillation: Secondary | ICD-10-CM | POA: Diagnosis not present

## 2018-06-17 DIAGNOSIS — I6521 Occlusion and stenosis of right carotid artery: Secondary | ICD-10-CM

## 2018-06-17 DIAGNOSIS — I10 Essential (primary) hypertension: Secondary | ICD-10-CM | POA: Diagnosis not present

## 2018-06-17 DIAGNOSIS — I25708 Atherosclerosis of coronary artery bypass graft(s), unspecified, with other forms of angina pectoris: Secondary | ICD-10-CM

## 2018-06-17 DIAGNOSIS — I5022 Chronic systolic (congestive) heart failure: Secondary | ICD-10-CM

## 2018-06-17 NOTE — Progress Notes (Signed)
SUBJECTIVE: The patient presents for routine follow-up.  Echocardiogram 02/04/2018 demonstrated normal left ventricular systolic function, LVEF 55 to 60%, with grade 1 diastolic dysfunction, mild mitral regurgitation, and moderate to severe biatrial dilatation, PASP 55 mmHg.  The patient denies any symptoms of chest pain, palpitations, shortness of breath, lightheadedness, dizziness, leg swelling, orthopnea, PND, and syncope.  His wife wants him to lose weight and he has difficult controlling food portions.  We talked about this and potentially aiming for a weight loss goal of half pound to a pound a week.      Review of Systems: As per "subjective", otherwise negative.  Allergies  Allergen Reactions  . Codeine Other (See Comments)  . Codeine Sulfate Nausea Only    Current Outpatient Medications  Medication Sig Dispense Refill  . atorvastatin (LIPITOR) 80 MG tablet Take 1 tablet (80 mg total) by mouth every evening. 90 tablet 3  . colchicine 0.6 MG tablet Take 0.6 mg by mouth daily as needed (for gout flare up).     . Cyanocobalamin (B-12) 1000 MCG SUBL Place 1,000 mcg under the tongue daily.     . cyclobenzaprine (FLEXERIL) 10 MG tablet Take 10 mg by mouth at bedtime as needed for muscle spasms  1  . doxazosin (CARDURA) 4 MG tablet Take 4 mg by mouth daily.  4  . finasteride (PROSCAR) 5 MG tablet Take 5 mg by mouth every evening.     . furosemide (LASIX) 40 MG tablet Take 1 tablet (40 mg total) by mouth 2 (two) times daily. 180 tablet 1  . Iron-Vitamin C (VITRON-C) 65-125 MG TABS Take 1 tablet by mouth daily.    Marland Kitchen lisinopril (PRINIVIL,ZESTRIL) 40 MG tablet Take 40 mg by mouth daily.    . nitroGLYCERIN (NITROSTAT) 0.4 MG SL tablet PLACE 1 TABLET UNDER THE TONGUE EVERY 5 MINUTES AS NEEDED FOR CHEST PAIN (Patient taking differently: Place 0.4 mg under the tongue every 5 (five) minutes as needed for chest pain. ) 25 tablet 3  . pantoprazole (PROTONIX) 40 MG tablet Take 1 tablet  (40 mg total) by mouth daily before breakfast. (Patient taking differently: Take 40 mg by mouth every evening. ) 90 tablet 3  . potassium chloride SA (KLOR-CON M20) 20 MEQ tablet Take 1 tablet (20 mEq total) by mouth 2 (two) times daily. 180 tablet 0  . rivaroxaban (XARELTO) 20 MG TABS tablet Take 1 tablet (20 mg total) by mouth daily. 30 tablet 11   No current facility-administered medications for this visit.     Past Medical History:  Diagnosis Date  . Arthritis   . Basal cell carcinoma   . Carotid stenosis   . Coronary artery disease    Multvessel s/p CABG 2015  . Enlarged prostate   . Essential hypertension   . GERD (gastroesophageal reflux disease)   . Gout   . History of colon polyps   . History of kidney stones   . Hyperlipidemia   . Lumbar disc disease   . Persistent atrial fibrillation   . Sleep apnea    CPAP    Past Surgical History:  Procedure Laterality Date  . ABLATION OF DYSRHYTHMIC FOCUS  07/28/2017  . ATRIAL FIBRILLATION ABLATION N/A 07/28/2017   Procedure: ATRIAL FIBRILLATION ABLATION;  Surgeon: Thompson Grayer, MD;  Location: [place] [facility];  Service: Cardiovascular;  Laterality: N/A;  . BACK SURGERY    . CARDIAC CATHETERIZATION  05/22/2014   Procedure: IABP INSERTION;  Surgeon: Leonie Man,  MD;  Location: [place] CATH LAB;  Service: Cardiovascular;;  . CARDIOVERSION N/A 04/29/2017   Procedure: CARDIOVERSION;  Surgeon: Satira Sark, MD;  [facility];  Service: Cardiovascular;  Laterality: N/A;  . CARDIOVERSION N/A 08/13/2017   Procedure: CARDIOVERSION;  Surgeon: Pixie Casino, MD;  Location: [facility] ENDOSCOPY;  Service: Cardiovascular;  Laterality: N/A;  . Cataract surgery Right   . COLONOSCOPY    . COLONOSCOPY N/A 12/30/2017   Procedure: COLONOSCOPY;  Surgeon: Rogene Houston, MD;  [facility];  Service: Endoscopy;  Laterality: N/A;  . CORONARY ARTERY BYPASS GRAFT N/A 05/22/2014   Procedure: CORONARY ARTERY BYPASS GRAFTING  (CABG) times three using left internal mammary and right saphenous vein.;  Surgeon: Melrose Nakayama, MD;  Location: [place];  Service: Open Heart Surgery;  Laterality: N/A;  . ENDARTERECTOMY Left 02/17/2014   Procedure: ENDARTERECTOMY CAROTID WITH PATCH ANGIOPLASTY;  Surgeon: Mal Misty, MD;  Location: [place];  Service: Vascular;  Laterality: Left;  . ESOPHAGEAL DILATION N/A 08/22/2015   Procedure: ESOPHAGEAL DILATION;  Surgeon: Rogene Houston, MD;  [facility];  Service: Endoscopy;  Laterality: N/A;  . ESOPHAGOGASTRODUODENOSCOPY N/A 08/22/2015   Procedure: ESOPHAGOGASTRODUODENOSCOPY (EGD);  Surgeon: Rogene Houston, MD;  [facility];  Service: Endoscopy;  Laterality: N/A;  12:45 - moved to 1:55 - Ann notified pt  . ESOPHAGOGASTRODUODENOSCOPY N/A 12/30/2017   Procedure: ESOPHAGOGASTRODUODENOSCOPY (EGD);  Surgeon: Rogene Houston, MD;  [facility];  Service: Endoscopy;  Laterality: N/A;  200  . EYE SURGERY     cataract extraction (right) with repair macular tear , with IOL     right  . FRACTURE SURGERY     bilateral wrist fractures- one ORIF  . JOINT REPLACEMENT  2011   left knee  . LEFT HEART CATHETERIZATION WITH CORONARY ANGIOGRAM N/A 05/22/2014   Procedure: LEFT HEART CATHETERIZATION WITH CORONARY ANGIOGRAM;  Surgeon: Leonie Man, MD;  Location: [facility] CATH LAB;  Service: Cardiovascular;  Laterality: N/A;  . LITHOTRIPSY    . PARS PLANA VITRECTOMY W/ REPAIR OF MACULAR HOLE    . RHINOPLASTY    . TEE WITHOUT CARDIOVERSION N/A 04/29/2017   Procedure: TRANSESOPHAGEAL ECHOCARDIOGRAM (TEE) WITH PROPOFOL;  Surgeon: Satira Sark, MD;  [facility];  Service: Cardiovascular;  Laterality: N/A;  . TONSILLECTOMY    . TOTAL HIP ARTHROPLASTY  12/08/2011   Procedure: TOTAL HIP ARTHROPLASTY;  Surgeon: Gearlean Alf, MD;  [facility];  Service: Orthopedics;  Laterality: Right;  . UPPER GI ENDOSCOPY  12/18/2015   Procedure: UPPER GI ENDOSCOPY;   Surgeon: Ralene Ok, MD;  Location: [facility];  Service: General;;  . WRIST SURGERY Right 34yrs ago  . WRIST SURGERY Left     Social History   Socioeconomic History  . Marital status: Married    Spouse name: Not on file  . Number of children: Not on file  . Years of education: Not on file  . Highest education level: Not on file  Occupational History  . Occupation: retired    Comment: [employer]  Social Needs  . Financial resource strain: Not on file  . Food insecurity:    Worry: Not on file    Inability: Not on file  . Transportation needs:    Medical: Not on file    Non-medical: Not on file  Tobacco Use  . Smoking status: Former Smoker    Packs/day: 1.50    Years: 25.00  Pack years: 37.50    Types: Cigarettes    Start date: 08/08/1960    Last attempt to quit: 06/02/1982    Years since quitting: 36.0  . Smokeless tobacco: Never Used  . Tobacco comment: quit smoking 30+yrs ago  Substance and Sexual Activity  . Alcohol use: No    Alcohol/week: 0.0 standard drinks    Comment: occasionally   . Drug use: No  . Sexual activity: Yes  Lifestyle  . Physical activity:    Days per week: Not on file    Minutes per session: Not on file  . Stress: Not on file  Relationships  . Social connections:    Talks on phone: Not on file    Gets together: Not on file    Attends religious service: Not on file    Active member of club or organization: Not on file    Attends meetings of clubs or organizations: Not on file    Relationship status: Not on file  . Intimate partner violence:    Fear of current or ex partner: Not on file    Emotionally abused: Not on file    Physically abused: Not on file    Forced sexual activity: Not on file  Other Topics Concern  . Not on file  Social History Narrative  . Not on file     Vitals:   06/17/18 1041  BP: (!) 136/57  Pulse: (!) 59  SpO2: 98%  Weight: 242 lb (109.8 kg)  Height: 5\' 10"  (1.778 m)    Wt Readings from  Last 3 Encounters:  06/17/18 242 lb (109.8 kg)  04/08/18 236 lb (107 kg)  01/29/18 233 lb 12.8 oz (106.1 kg)     PHYSICAL EXAM General: NAD HEENT: Normal. Neck: No JVD, no thyromegaly. Lungs: Clear to auscultation bilaterally with normal respiratory effort. CV: Regular rate and rhythm, normal S1/S2, no S3/S4, no murmur. No pretibial or periankle edema.  No carotid bruit.   Abdomen: Soft, nontender, no distention.  Neurologic: Alert and oriented.  Psych: Normal affect. Skin: Normal. Musculoskeletal: No gross deformities.    ECG: Reviewed above under Subjective   Labs: Lab Results  Component Value Date/Time   K 4.3 10/28/2017 02:52 PM   BUN 21 10/28/2017 02:52 PM   CREATININE 1.33 (H) 10/28/2017 02:52 PM   CREATININE 1.27 07/17/2014 12:17 PM   ALT 29 10/28/2017 02:52 PM   TSH 3.070 10/28/2017 02:52 PM   HGB 11.4 (L) 03/26/2018 11:19 AM     Lipids: Lab Results  Component Value Date/Time   LDLCALC 75 03/22/2015 08:16 AM   CHOL 134 03/22/2015 08:16 AM   TRIG 92 03/22/2015 08:16 AM   HDL 41 03/22/2015 08:16 AM       ASSESSMENT AND PLAN: 1. Coronary artery disease with a history of CABG: Symptomatically stable.  Currently on Lipitor and lisinopril.  He is not on aspirin as he is on Xarelto.    2. Persistent atrial fibrillation: Status post ablation in February 2019 and subsequent cardioversion. Symptomatically stable. He is anticoagulated with Xarelto.  3. Hypertension: Blood pressure is normal.  4. Hyperlipidemia: LDL 98 on 12/14/2017.  Lipitor was increased from 40 to 80 mg.  He will need repeat lipids.  5. Carotid artery stenosis status post left CEA: Carotid Dopplers on 04/08/2018 showed near complete occlusion of the right common carotid and right internal carotid arteries with patent left carotid endarterectomy with 1-39% stenosis. Continue statin.He is on Xarelto for atrial fibrillation.  6. Chronic  systolic heart failure:Symptomatically stable.     LVEF normalized by echocardiogram on 02/04/2018, 55 to 60%.  Currently on Lasix 40 mg twice daily and supplemental potassium.  I will continue lisinopril.    Disposition: Follow up with [name] in the near future.  Follow-up with me 6 months afterwards.   Kate Sable, [name], F.A.C.C.

## 2018-06-17 NOTE — Patient Instructions (Signed)
Medication Instructions:  Your physician recommends that you continue on your current medications as directed. Please refer to the Current Medication list given to you today.   Labwork: NONE  Testing/Procedures: NONE  Follow-Up: Your physician wants you to follow-up in: [place] DR. ALLRED.  You will receive a reminder letter in the mail two months in advance. If you don't receive a letter, please call our office to schedule the follow-up appointment.   Any Other Special Instructions Will Be Listed Below (If Applicable).     If you need a refill on your cardiac medications before your next appointment, please call your pharmacy.

## 2018-06-18 DIAGNOSIS — D649 Anemia, unspecified: Secondary | ICD-10-CM | POA: Diagnosis not present

## 2018-06-18 DIAGNOSIS — I480 Paroxysmal atrial fibrillation: Secondary | ICD-10-CM | POA: Diagnosis not present

## 2018-06-18 DIAGNOSIS — I5022 Chronic systolic (congestive) heart failure: Secondary | ICD-10-CM | POA: Diagnosis not present

## 2018-06-28 DIAGNOSIS — H35033 Hypertensive retinopathy, bilateral: Secondary | ICD-10-CM | POA: Diagnosis not present

## 2018-07-15 ENCOUNTER — Telehealth: Payer: Self-pay | Admitting: Internal Medicine

## 2018-07-15 NOTE — Telephone Encounter (Signed)
Patient is now aware nothing further is needed at this time.

## 2018-07-15 NOTE — Telephone Encounter (Signed)
Pt returning call CB# [phone]//[phone]

## 2018-07-15 NOTE — Telephone Encounter (Signed)
Called and left the patient a message we do not need his cpap he is enrolled on air view I have verified this today will call back

## 2018-07-20 ENCOUNTER — Encounter: Payer: Self-pay | Admitting: Internal Medicine

## 2018-07-20 ENCOUNTER — Ambulatory Visit (INDEPENDENT_AMBULATORY_CARE_PROVIDER_SITE_OTHER): Payer: Medicare Other | Admitting: Internal Medicine

## 2018-07-20 VITALS — BP 134/60 | HR 66 | Ht 70.0 in | Wt 247.0 lb

## 2018-07-20 DIAGNOSIS — I6521 Occlusion and stenosis of right carotid artery: Secondary | ICD-10-CM | POA: Diagnosis not present

## 2018-07-20 DIAGNOSIS — G4733 Obstructive sleep apnea (adult) (pediatric): Secondary | ICD-10-CM

## 2018-07-20 DIAGNOSIS — I4819 Other persistent atrial fibrillation: Secondary | ICD-10-CM | POA: Diagnosis not present

## 2018-07-20 NOTE — Assessment & Plan Note (Addendum)
He continues to benefit from CPAP with improved sleep, confirmed by his wife and by download showing good compliance and control.  Comfort measures and humidifier settings were discussed. Plan-continue CPAP auto 5-15

## 2018-07-20 NOTE — Progress Notes (Signed)
Subjective:    Patient ID: Caleb Wolf, male    DOB: 1941/05/01, 78 y.o.   MRN: [id number]  HPI   Male former smoker followed for OSA, complicated by HBP, CAD/MI/CABG, carotid stenosis, osteoarthritis hip, GERD NPSG 07/2012  AHI 15/ hr, CPAP to 10 ---------------------------------------------------.  07/20/17-  78 year old male former smoker followed for OSA, complicated by HBP, CAD/MI/CABG/AFib, carotid stenosis, osteoarthritis hip, GERD CPAP auto 5-15/Caleb Wolf ----OSA; DME: [facility]. Pt wears CPAP nightly. DL attached. Pt should be due for new CPAP.  Xarelto, amiodarone Sleeps much better with CPAP and cannot get by without it. Download 98% compliance, AHI 12.8/hour.  Has SoClean machine.  07/20/2018- 78 year old male former smoker followed for OSA, complicated by HBP, CAD/MI/CABG/AFib, carotid stenosis, osteoarthritis hip, GERD CPAP auto 5-15/Caleb Wolf office is now closed) -----Follows for: OSA Download 93% compliance AHI 1.7/hour Body weight today 247 pounds This machine was new in 2019.  Especially in winter months the humidifier runs dry.  We discussed adjusting it and possibly adding a room humidifier if needed while indoor heat is on.  Otherwise he is doing quite well with no acute events.  ROS-see HPI + = positive Constitutional:    weight loss, night sweats, fevers, chills, fatigue, lassitude. HEENT:    headaches, difficulty swallowing, tooth/dental problems, sore throat,       sneezing, itching, ear ache, nasal congestion, post nasal drip, snoring CV:    chest pain, orthopnea, PND, swelling in lower extremities, anasarca,                                               dizziness, palpitations Resp:   shortness of breath with exertion or at rest.                productive cough,   non-productive cough, coughing up of blood.              change in color of mucus.  wheezing.   Skin:    rash or lesions. GI:  No-   heartburn, indigestion, abdominal pain, nausea,  vomiting, diarrhea,                 change in bowel habits, loss of appetite GU: dysuria, change in color of urine, no urgency or frequency.   flank pain. MS:   joint pain, stiffness, decreased range of motion, back pain. Neuro-     nothing unusual Psych:  change in mood or affect.  depression or anxiety.   memory loss.    Objective:  OBJ- Physical Exam General- Alert, Oriented, Affect-appropriate, Distress- none acute, + overweight Skin- rash-none, lesions- none, excoriation- none Lymphadenopathy- none Head- atraumatic            Eyes- Gross vision intact, PERRLA, conjunctivae and secretions clear            Ears- Hearing, canals-normal            Nose- Clear, no-Septal dev, mucus, polyps, erosion, perforation             Throat- Mallampati III-IV, mucosa clear , drainage- none, tonsils- atrophic Neck- flexible , trachea midline, no stridor , thyroid nl, carotid no bruit Chest - symmetrical excursion , unlabored           Heart/CV- RR+today , no murmur , no gallop  , no rub, nl s1 s2                           -  JVD- none , edema- none, stasis changes- none, varices- none           Lung- clear to P&A, wheeze- none, cough- none , dullness-none, rub- none           Chest wall-  Abd-  Br/ Gen/ Rectal- Not done, not indicated Extrem- cyanosis- none, clubbing, none, atrophy- none, strength- nl Neuro- grossly intact to observation    Assessment & Plan:

## 2018-07-20 NOTE — Assessment & Plan Note (Signed)
Rhythm felt very regular, consistent with NSR at this visit

## 2018-07-20 NOTE — Patient Instructions (Signed)
We can continue CPAP auto 5-15, mask of choice, humidifier supplies, Airview/ card  Please call if we can help

## 2018-08-04 ENCOUNTER — Encounter: Payer: Self-pay | Admitting: Internal Medicine

## 2018-08-04 ENCOUNTER — Ambulatory Visit (INDEPENDENT_AMBULATORY_CARE_PROVIDER_SITE_OTHER): Payer: Medicare Other | Admitting: Internal Medicine

## 2018-08-04 VITALS — BP 138/78 | HR 66 | Ht 70.0 in | Wt 245.0 lb

## 2018-08-04 DIAGNOSIS — I10 Essential (primary) hypertension: Secondary | ICD-10-CM

## 2018-08-04 DIAGNOSIS — G4733 Obstructive sleep apnea (adult) (pediatric): Secondary | ICD-10-CM

## 2018-08-04 DIAGNOSIS — R591 Generalized enlarged lymph nodes: Secondary | ICD-10-CM | POA: Diagnosis not present

## 2018-08-04 DIAGNOSIS — I6521 Occlusion and stenosis of right carotid artery: Secondary | ICD-10-CM | POA: Diagnosis not present

## 2018-08-04 DIAGNOSIS — I5022 Chronic systolic (congestive) heart failure: Secondary | ICD-10-CM

## 2018-08-04 DIAGNOSIS — I4819 Other persistent atrial fibrillation: Secondary | ICD-10-CM

## 2018-08-04 NOTE — Progress Notes (Signed)
PCP: Asencion Noble, MD Primary Cardiologist: Dr Bronson Ing Primary EP: Dr Jethro Bolus is a 78 y.o. male who presents today for routine electrophysiology followup.  Since last being seen in our clinic, the patient reports doing very well.  Today, he denies symptoms of palpitations, chest pain, shortness of breath,  lower extremity edema, dizziness, presyncope, or syncope.  The patient is otherwise without complaint today.   Past Medical History:  Diagnosis Date  . Arthritis   . Basal cell carcinoma   . Carotid stenosis   . Coronary artery disease    Multvessel s/p CABG 2015  . Enlarged prostate   . Essential hypertension   . GERD (gastroesophageal reflux disease)   . Gout   . History of colon polyps   . History of kidney stones   . Hyperlipidemia   . Lumbar disc disease   . Persistent atrial fibrillation   . Sleep apnea    CPAP   Past Surgical History:  Procedure Laterality Date  . ABLATION OF DYSRHYTHMIC FOCUS  07/28/2017  . ATRIAL FIBRILLATION ABLATION N/A 07/28/2017   Procedure: ATRIAL FIBRILLATION ABLATION;  Surgeon: Thompson Grayer, MD;  Location: [place] [facility];  Service: Cardiovascular;  Laterality: N/A;  . BACK SURGERY    . CARDIAC CATHETERIZATION  05/22/2014   Procedure: IABP INSERTION;  Surgeon: Leonie Man, MD;  Location: [facility] CATH LAB;  Service: Cardiovascular;;  . CARDIOVERSION N/A 04/29/2017   Procedure: CARDIOVERSION;  Surgeon: Satira Sark, MD;  [facility];  Service: Cardiovascular;  Laterality: N/A;  . CARDIOVERSION N/A 08/13/2017   Procedure: CARDIOVERSION;  Surgeon: Pixie Casino, MD;  Location: [facility] ENDOSCOPY;  Service: Cardiovascular;  Laterality: N/A;  . Cataract surgery Right   . COLONOSCOPY    . COLONOSCOPY N/A 12/30/2017   Procedure: COLONOSCOPY;  Surgeon: Rogene Houston, MD;  [facility];  Service: Endoscopy;  Laterality: N/A;  . CORONARY ARTERY BYPASS GRAFT N/A 05/22/2014   Procedure: CORONARY ARTERY  BYPASS GRAFTING (CABG) times three using left internal mammary and right saphenous vein.;  Surgeon: Melrose Nakayama, MD;  Location: [place];  Service: Open Heart Surgery;  Laterality: N/A;  . ENDARTERECTOMY Left 02/17/2014   Procedure: ENDARTERECTOMY CAROTID WITH PATCH ANGIOPLASTY;  Surgeon: Mal Misty, MD;  Location: [place];  Service: Vascular;  Laterality: Left;  . ESOPHAGEAL DILATION N/A 08/22/2015   Procedure: ESOPHAGEAL DILATION;  Surgeon: Rogene Houston, MD;  [facility];  Service: Endoscopy;  Laterality: N/A;  . ESOPHAGOGASTRODUODENOSCOPY N/A 08/22/2015   Procedure: ESOPHAGOGASTRODUODENOSCOPY (EGD);  Surgeon: Rogene Houston, MD;  [facility];  Service: Endoscopy;  Laterality: N/A;  12:45 - moved to 1:55 - Ann notified pt  . ESOPHAGOGASTRODUODENOSCOPY N/A 12/30/2017   Procedure: ESOPHAGOGASTRODUODENOSCOPY (EGD);  Surgeon: Rogene Houston, MD;  [facility];  Service: Endoscopy;  Laterality: N/A;  200  . EYE SURGERY     cataract extraction (right) with repair macular tear , with IOL     right  . FRACTURE SURGERY     bilateral wrist fractures- one ORIF  . JOINT REPLACEMENT  2011   left knee  . LEFT HEART CATHETERIZATION WITH CORONARY ANGIOGRAM N/A 05/22/2014   Procedure: LEFT HEART CATHETERIZATION WITH CORONARY ANGIOGRAM;  Surgeon: Leonie Man, MD;  Location: [facility] CATH LAB;  Service: Cardiovascular;  Laterality: N/A;  . LITHOTRIPSY    . PARS PLANA VITRECTOMY W/ REPAIR OF MACULAR HOLE    .  RHINOPLASTY    . TEE WITHOUT CARDIOVERSION N/A 04/29/2017   Procedure: TRANSESOPHAGEAL ECHOCARDIOGRAM (TEE) WITH PROPOFOL;  Surgeon: Satira Sark, MD;  [facility];  Service: Cardiovascular;  Laterality: N/A;  . TONSILLECTOMY    . TOTAL HIP ARTHROPLASTY  12/08/2011   Procedure: TOTAL HIP ARTHROPLASTY;  Surgeon: Gearlean Alf, MD;  [facility]: [facility];  Service: Orthopedics;  Laterality: Right;  . UPPER GI ENDOSCOPY  12/18/2015   Procedure: UPPER  GI ENDOSCOPY;  Surgeon: Ralene Ok, MD;  Location: [facility];  Service: General;;  . WRIST SURGERY Right 71yrs ago  . WRIST SURGERY Left     ROS- all systems are reviewed and negatives except as per HPI above  Current Outpatient Medications  Medication Sig Dispense Refill  . atorvastatin (LIPITOR) 80 MG tablet Take 1 tablet (80 mg total) by mouth every evening. 90 tablet 3  . colchicine 0.6 MG tablet Take 0.6 mg by mouth daily as needed (for gout flare up).     . Cyanocobalamin (B-12) 1000 MCG SUBL Place 1,000 mcg under the tongue daily.     . cyclobenzaprine (FLEXERIL) 10 MG tablet Take 10 mg by mouth at bedtime as needed for muscle spasms  1  . doxazosin (CARDURA) 4 MG tablet Take 4 mg by mouth daily.  4  . finasteride (PROSCAR) 5 MG tablet Take 5 mg by mouth every evening.     . furosemide (LASIX) 40 MG tablet Take 1 tablet (40 mg total) by mouth 2 (two) times daily. 180 tablet 1  . Iron-Vitamin C (VITRON-C) 65-125 MG TABS Take 1 tablet by mouth daily.    Marland Kitchen lisinopril (PRINIVIL,ZESTRIL) 40 MG tablet Take 40 mg by mouth daily.    . nitroGLYCERIN (NITROSTAT) 0.4 MG SL tablet PLACE 1 TABLET UNDER THE TONGUE EVERY 5 MINUTES AS NEEDED FOR CHEST PAIN (Patient taking differently: Place 0.4 mg under the tongue every 5 (five) minutes as needed for chest pain. ) 25 tablet 3  . pantoprazole (PROTONIX) 40 MG tablet Take 1 tablet (40 mg total) by mouth daily before breakfast. (Patient taking differently: Take 40 mg by mouth every evening. ) 90 tablet 3  . potassium chloride SA (KLOR-CON M20) 20 MEQ tablet Take 1 tablet (20 mEq total) by mouth 2 (two) times daily. (Patient taking differently: Take 20 mEq by mouth daily. ) 180 tablet 0  . rivaroxaban (XARELTO) 20 MG TABS tablet Take 1 tablet (20 mg total) by mouth daily. 30 tablet 11  . amoxicillin (AMOXIL) 500 MG capsule Use as directed     No current facility-administered medications for this visit.     Physical Exam: Vitals:   08/04/18 0939    BP: 138/78  Pulse: 66  SpO2: 98%  Weight: 245 lb (111.1 kg)  Height: 5\' 10"  (1.778 m)    GEN- The patient is well appearing, alert and oriented x 3 today.   Head- normocephalic, atraumatic Eyes-  Sclera clear, conjunctiva pink Ears- hearing intact Oropharynx- clear Lungs- Clear to ausculation bilaterally, normal work of breathing Heart- Regular rate and rhythm, no murmurs, rubs or gallops, PMI not laterally displaced GI- soft, NT, ND, + BS Extremities- no clubbing, cyanosis, or edema  Wt Readings from Last 3 Encounters:  08/04/18 245 lb (111.1 kg)  07/20/18 247 lb (112 kg)  06/17/18 242 lb (109.8 kg)    EKG tracing ordered today is personally reviewed and shows sinus with PACs  Assessment and Plan:  1. Persistent afib Doing very well post  ablation off AAD therapy chads2vasc score is 4.  On xarelto We discussed REACT AF.  He has an apple watch.  He would be interested in considering this study if it becomes an option  2. OSA Compliance encouraged with CPAP  3. Chronic systolic dysfunction (tachycardia mediated) EF has recovered with sinus rhythm (echo 02/04/18 reviewed)  4. Overweight Body mass index is 35.15 kg/m. Lifestyle modification encouraged  5. CAD s/p CABG No ischemic symptoms   Return to see me in a year Follow-up with Dr Bronson Ing as scheduled  Thompson Grayer MD, [facility] 08/04/2018 10:25 AM

## 2018-08-04 NOTE — Patient Instructions (Signed)

## 2018-08-06 DIAGNOSIS — N528 Other male erectile dysfunction: Secondary | ICD-10-CM | POA: Diagnosis not present

## 2018-08-06 DIAGNOSIS — N3941 Urge incontinence: Secondary | ICD-10-CM | POA: Diagnosis not present

## 2018-08-06 DIAGNOSIS — N3943 Post-void dribbling: Secondary | ICD-10-CM | POA: Diagnosis not present

## 2018-08-06 DIAGNOSIS — N312 Flaccid neuropathic bladder, not elsewhere classified: Secondary | ICD-10-CM | POA: Diagnosis not present

## 2018-08-06 DIAGNOSIS — N200 Calculus of kidney: Secondary | ICD-10-CM | POA: Diagnosis not present

## 2018-08-06 DIAGNOSIS — N401 Enlarged prostate with lower urinary tract symptoms: Secondary | ICD-10-CM | POA: Diagnosis not present

## 2018-09-16 DIAGNOSIS — Z79899 Other long term (current) drug therapy: Secondary | ICD-10-CM | POA: Diagnosis not present

## 2018-09-16 DIAGNOSIS — N183 Chronic kidney disease, stage 3 (moderate): Secondary | ICD-10-CM | POA: Diagnosis not present

## 2018-09-16 DIAGNOSIS — D649 Anemia, unspecified: Secondary | ICD-10-CM | POA: Diagnosis not present

## 2018-09-16 DIAGNOSIS — I5022 Chronic systolic (congestive) heart failure: Secondary | ICD-10-CM | POA: Diagnosis not present

## 2018-09-23 DIAGNOSIS — E875 Hyperkalemia: Secondary | ICD-10-CM | POA: Diagnosis not present

## 2018-09-23 DIAGNOSIS — N183 Chronic kidney disease, stage 3 (moderate): Secondary | ICD-10-CM | POA: Diagnosis not present

## 2018-09-23 DIAGNOSIS — I5022 Chronic systolic (congestive) heart failure: Secondary | ICD-10-CM | POA: Diagnosis not present

## 2018-09-23 DIAGNOSIS — D649 Anemia, unspecified: Secondary | ICD-10-CM | POA: Diagnosis not present

## 2018-10-13 ENCOUNTER — Other Ambulatory Visit: Payer: Self-pay | Admitting: Cardiovascular Disease

## 2018-10-13 MED ORDER — RIVAROXABAN 20 MG PO TABS: 20.0000 mg | ORAL_TABLET | Freq: Every day | ORAL | 11 refills | Status: DC

## 2018-10-13 NOTE — Telephone Encounter (Signed)
Refill complete 

## 2018-10-13 NOTE — Telephone Encounter (Signed)
°* ° ° °  1. Which medications need to be refilled? (please list name of each medication and dose if known) XARELTO) 20 MG TABS    2. Which pharmacy/location (including street and city if local pharmacy) is medication to be sent to? [facility] [place]   3. Do they need a 30 day or 90 day supply?

## 2018-10-19 ENCOUNTER — Other Ambulatory Visit: Payer: Self-pay | Admitting: Cardiovascular Disease

## 2018-11-23 DIAGNOSIS — L570 Actinic keratosis: Secondary | ICD-10-CM | POA: Diagnosis not present

## 2018-12-28 DIAGNOSIS — I4891 Unspecified atrial fibrillation: Secondary | ICD-10-CM | POA: Diagnosis not present

## 2018-12-28 DIAGNOSIS — Z79899 Other long term (current) drug therapy: Secondary | ICD-10-CM | POA: Diagnosis not present

## 2018-12-28 DIAGNOSIS — I5022 Chronic systolic (congestive) heart failure: Secondary | ICD-10-CM | POA: Diagnosis not present

## 2018-12-28 DIAGNOSIS — E875 Hyperkalemia: Secondary | ICD-10-CM | POA: Diagnosis not present

## 2018-12-28 DIAGNOSIS — M109 Gout, unspecified: Secondary | ICD-10-CM | POA: Diagnosis not present

## 2018-12-28 DIAGNOSIS — N183 Chronic kidney disease, stage 3 (moderate): Secondary | ICD-10-CM | POA: Diagnosis not present

## 2018-12-28 DIAGNOSIS — D649 Anemia, unspecified: Secondary | ICD-10-CM | POA: Diagnosis not present

## 2019-01-07 DIAGNOSIS — I2510 Atherosclerotic heart disease of native coronary artery without angina pectoris: Secondary | ICD-10-CM | POA: Diagnosis not present

## 2019-01-07 DIAGNOSIS — I480 Paroxysmal atrial fibrillation: Secondary | ICD-10-CM | POA: Diagnosis not present

## 2019-01-07 DIAGNOSIS — I5022 Chronic systolic (congestive) heart failure: Secondary | ICD-10-CM | POA: Diagnosis not present

## 2019-01-07 DIAGNOSIS — N183 Chronic kidney disease, stage 3 (moderate): Secondary | ICD-10-CM | POA: Diagnosis not present

## 2019-01-14 DIAGNOSIS — S91332A Puncture wound without foreign body, left foot, initial encounter: Secondary | ICD-10-CM | POA: Diagnosis not present

## 2019-01-17 DIAGNOSIS — H35372 Puckering of macula, left eye: Secondary | ICD-10-CM | POA: Diagnosis not present

## 2019-01-17 DIAGNOSIS — H4720 Unspecified optic atrophy: Secondary | ICD-10-CM | POA: Diagnosis not present

## 2019-01-17 DIAGNOSIS — H2512 Age-related nuclear cataract, left eye: Secondary | ICD-10-CM | POA: Diagnosis not present

## 2019-01-17 DIAGNOSIS — H43812 Vitreous degeneration, left eye: Secondary | ICD-10-CM | POA: Diagnosis not present

## 2019-01-17 DIAGNOSIS — H43392 Other vitreous opacities, left eye: Secondary | ICD-10-CM | POA: Diagnosis not present

## 2019-02-03 ENCOUNTER — Encounter: Payer: Self-pay | Admitting: Cardiovascular Disease

## 2019-02-03 ENCOUNTER — Other Ambulatory Visit: Payer: Self-pay

## 2019-02-03 ENCOUNTER — Ambulatory Visit (INDEPENDENT_AMBULATORY_CARE_PROVIDER_SITE_OTHER): Payer: Medicare Other | Admitting: Cardiovascular Disease

## 2019-02-03 VITALS — BP 155/67 | HR 72 | Temp 97.8°F | Ht 71.0 in | Wt 244.0 lb

## 2019-02-03 DIAGNOSIS — I10 Essential (primary) hypertension: Secondary | ICD-10-CM | POA: Diagnosis not present

## 2019-02-03 DIAGNOSIS — I4819 Other persistent atrial fibrillation: Secondary | ICD-10-CM | POA: Diagnosis not present

## 2019-02-03 DIAGNOSIS — I25708 Atherosclerosis of coronary artery bypass graft(s), unspecified, with other forms of angina pectoris: Secondary | ICD-10-CM | POA: Diagnosis not present

## 2019-02-03 DIAGNOSIS — E785 Hyperlipidemia, unspecified: Secondary | ICD-10-CM | POA: Diagnosis not present

## 2019-02-03 DIAGNOSIS — I6521 Occlusion and stenosis of right carotid artery: Secondary | ICD-10-CM | POA: Diagnosis not present

## 2019-02-03 DIAGNOSIS — Z9889 Other specified postprocedural states: Secondary | ICD-10-CM

## 2019-02-03 MED ORDER — NITROGLYCERIN 0.4 MG SL SUBL: SUBLINGUAL_TABLET | SUBLINGUAL | 3 refills | Status: DC

## 2019-02-03 NOTE — Patient Instructions (Addendum)
Medication Instructions: Your physician recommends that you continue on your current medications as directed. Please refer to the Current Medication list given to you today.   Labwork: None  Procedures/Testing: None  Follow-Up: 6 months with Dr.Koneswaran AFTER you see Dr.Allred in March 2021  Any Additional Special Instructions Will Be Listed Below (If Applicable).     If you need a refill on your cardiac medications before your next appointment, please call your pharmacy.   Thank you for choosing [facility] !

## 2019-02-03 NOTE — Progress Notes (Signed)
SUBJECTIVE: The patient presents for routine follow-up.  Echocardiogram 02/04/2018 demonstrated normal left ventricular systolic function, LVEF 55 to 60%, with grade 1 diastolic dysfunction, mild mitral regurgitation, and moderate to severe biatrial dilatation, PASP 55 mmHg.  He denies exertional chest pain.  He has chronic exertional dyspnea which is stable.  He denies palpitations, orthopnea, and paroxysmal nocturnal dyspnea.  His wife was unable to make it today as she is helping to take care of her grandchildren today.   Review of Systems: As per "subjective", otherwise negative.  Allergies  Allergen Reactions  . Codeine Other (See Comments)  . Codeine Sulfate Nausea Only    Current Outpatient Medications  Medication Sig Dispense Refill  . amoxicillin (AMOXIL) 500 MG capsule Use as directed    . atorvastatin (LIPITOR) 80 MG tablet Take 1 tablet (80 mg total) by mouth every evening. 90 tablet 3  . colchicine 0.6 MG tablet Take 0.6 mg by mouth daily as needed (for gout flare up).     . Cyanocobalamin (B-12) 1000 MCG SUBL Place 1,000 mcg under the tongue daily.     . cyclobenzaprine (FLEXERIL) 10 MG tablet Take 10 mg by mouth at bedtime as needed for muscle spasms  1  . doxazosin (CARDURA) 4 MG tablet Take 4 mg by mouth daily.  4  . finasteride (PROSCAR) 5 MG tablet Take 5 mg by mouth every evening.     . furosemide (LASIX) 40 MG tablet TAKE 1 TABLET BY MOUTH TWICE A DAY 180 tablet 1  . Iron-Vitamin C (VITRON-C) 65-125 MG TABS Take 1 tablet by mouth daily.    Marland Kitchen lisinopril (PRINIVIL,ZESTRIL) 40 MG tablet Take 40 mg by mouth daily.    . nitroGLYCERIN (NITROSTAT) 0.4 MG SL tablet PLACE 1 TABLET UNDER THE TONGUE EVERY 5 MINUTES AS NEEDED FOR CHEST PAIN (Patient taking differently: Place 0.4 mg under the tongue every 5 (five) minutes as needed for chest pain. ) 25 tablet 3  . pantoprazole (PROTONIX) 40 MG tablet Take 1 tablet (40 mg total) by mouth daily before breakfast. (Patient  taking differently: Take 40 mg by mouth every evening. ) 90 tablet 3  . potassium chloride SA (KLOR-CON M20) 20 MEQ tablet Take 1 tablet (20 mEq total) by mouth 2 (two) times daily. (Patient taking differently: Take 20 mEq by mouth daily. ) 180 tablet 0  . rivaroxaban (XARELTO) 20 MG TABS tablet Take 1 tablet (20 mg total) by mouth daily. 30 tablet 11   No current facility-administered medications for this visit.     Past Medical History:  Diagnosis Date  . Arthritis   . Basal cell carcinoma   . Carotid stenosis   . Coronary artery disease    Multvessel s/p CABG 2015  . Enlarged prostate   . Essential hypertension   . GERD (gastroesophageal reflux disease)   . Gout   . History of colon polyps   . History of kidney stones   . Hyperlipidemia   . Lumbar disc disease   . Persistent atrial fibrillation   . Sleep apnea    CPAP    Past Surgical History:  Procedure Laterality Date  . ABLATION OF DYSRHYTHMIC FOCUS  07/28/2017  . ATRIAL FIBRILLATION ABLATION N/A 07/28/2017   Procedure: ATRIAL FIBRILLATION ABLATION;  Surgeon: Thompson Grayer, MD;  Location: [place] [facility];  Service: Cardiovascular;  Laterality: N/A;  . BACK SURGERY    . CARDIAC CATHETERIZATION  05/22/2014   Procedure: IABP INSERTION;  Surgeon: Shanon Brow  Loren Racer, MD;  Location[facility] CATH LAB;  Service: Cardiovascular;;  . CARDIOVERSION N/A 04/29/2017   Procedure: CARDIOVERSION;  Surgeon: Satira Sark, MD;  [facility];  Service: Cardiovascular;  Laterality: N/A;  . CARDIOVERSION N/A 08/13/2017   Procedure: CARDIOVERSION;  Surgeon: Pixie Casino, MD;  Location: [facility] ENDOSCOPY;  Service: Cardiovascular;  Laterality: N/A;  . Cataract surgery Right   . COLONOSCOPY    . COLONOSCOPY N/A 12/30/2017   Procedure: COLONOSCOPY;  Surgeon: Rogene Houston, MD;  [facility];  Service: Endoscopy;  Laterality: N/A;  . CORONARY ARTERY BYPASS GRAFT N/A 05/22/2014   Procedure: CORONARY ARTERY BYPASS GRAFTING  (CABG) times three using left internal mammary and right saphenous vein.;  Surgeon: Melrose Nakayama, MD;  Location: [place];  Service: Open Heart Surgery;  Laterality: N/A;  . ENDARTERECTOMY Left 02/17/2014   Procedure: ENDARTERECTOMY CAROTID WITH PATCH ANGIOPLASTY;  Surgeon: Mal Misty, MD;  Location: [place];  Service: Vascular;  Laterality: Left;  . ESOPHAGEAL DILATION N/A 08/22/2015   Procedure: ESOPHAGEAL DILATION;  Surgeon: Rogene Houston, MD;  [facility];  Service: Endoscopy;  Laterality: N/A;  . ESOPHAGOGASTRODUODENOSCOPY N/A 08/22/2015   Procedure: ESOPHAGOGASTRODUODENOSCOPY (EGD);  Surgeon: Rogene Houston, MD;  [facility];  Service: Endoscopy;  Laterality: N/A;  12:45 - moved to 1:55 - Ann notified pt  . ESOPHAGOGASTRODUODENOSCOPY N/A 12/30/2017   Procedure: ESOPHAGOGASTRODUODENOSCOPY (EGD);  Surgeon: Rogene Houston, MD;  [facility];  Service: Endoscopy;  Laterality: N/A;  200  . EYE SURGERY     cataract extraction (right) with repair macular tear , with IOL     right  . FRACTURE SURGERY     bilateral wrist fractures- one ORIF  . JOINT REPLACEMENT  2011   left knee  . LEFT HEART CATHETERIZATION WITH CORONARY ANGIOGRAM N/A 05/22/2014   Procedure: LEFT HEART CATHETERIZATION WITH CORONARY ANGIOGRAM;  Surgeon: Leonie Man, MD;  Location: [facility] CATH LAB;  Service: Cardiovascular;  Laterality: N/A;  . LITHOTRIPSY    . PARS PLANA VITRECTOMY W/ REPAIR OF MACULAR HOLE    . RHINOPLASTY    . TEE WITHOUT CARDIOVERSION N/A 04/29/2017   Procedure: TRANSESOPHAGEAL ECHOCARDIOGRAM (TEE) WITH PROPOFOL;  Surgeon: Satira Sark, MD;  [facility];  Service: Cardiovascular;  Laterality: N/A;  . TONSILLECTOMY    . TOTAL HIP ARTHROPLASTY  12/08/2011   Procedure: TOTAL HIP ARTHROPLASTY;  Surgeon: Gearlean Alf, MD;  [facility];  Service: Orthopedics;  Laterality: Right;  . UPPER GI ENDOSCOPY  12/18/2015   Procedure: UPPER GI ENDOSCOPY;   Surgeon: Ralene Ok, MD;  [facility];  Service: General;;  . WRIST SURGERY Right 52yrs ago  . WRIST SURGERY Left     Social History   Socioeconomic History  . Marital status: Married    Spouse name: Not on file  . Number of children: Not on file  . Years of education: Not on file  . Highest education level: Not on file  Occupational History  . Occupation: retired    Comment: [employer]omet[employer]  Social Needs  . Financial resource strain: Not on file  . Food insecurity    Worry: Not on file    Inability: Not on file  . Transportation needs    Medical: Not on file    Non-medical: Not on file  Tobacco Use  . Smoking status: Former Smoker    Packs/day: 1.50  Years: 25.00    Pack years: 37.50    Types: Cigarettes    Start date: 08/08/1960    Quit date: 06/02/1982    Years since quitting: 36.6  . Smokeless tobacco: Never Used  . Tobacco comment: quit smoking 30+yrs ago  Substance and Sexual Activity  . Alcohol use: No    Alcohol/week: 0.0 standard drinks    Comment: occasionally   . Drug use: No  . Sexual activity: Yes  Lifestyle  . Physical activity    Days per week: Not on file    Minutes per session: Not on file  . Stress: Not on file  Relationships  . Social Herbalist on phone: Not on file    Gets together: Not on file    Attends religious service: Not on file    Active member of club or organization: Not on file    Attends meetings of clubs or organizations: Not on file    Relationship status: Not on file  . Intimate partner violence    Fear of current or ex partner: Not on file    Emotionally abused: Not on file    Physically abused: Not on file    Forced sexual activity: Not on file  Other Topics Concern  . Not on file  Social History Narrative  . Not on file     Vitals:   02/03/19 1022  BP: (!) 155/67  Pulse: 72  Temp: 97.8 F (36.6 C)  SpO2: 98%  Height: 5\' 11"  (1.803 m)    Wt Readings from Last 3 Encounters:   08/04/18 245 lb (111.1 kg)  07/20/18 247 lb (112 kg)  06/17/18 242 lb (109.8 kg)     PHYSICAL EXAM General: NAD HEENT: Normal. Neck: No JVD, no thyromegaly. Lungs: Clear to auscultation bilaterally with normal respiratory effort. CV: Regular rate and rhythm, normal S1/S2, no S3/S4, soft apical holosystolic murmur. No pretibial or periankle edema.  No carotid bruit.   Abdomen: Soft, nontender, no distention.  Neurologic: Alert and oriented.  Psych: Normal affect. Skin: Normal. Musculoskeletal: No gross deformities.    ECG: Reviewed above under Subjective   Labs: Lab Results  Component Value Date/Time   K 4.3 10/28/2017 02:52 PM   BUN 21 10/28/2017 02:52 PM   CREATININE 1.33 (H) 10/28/2017 02:52 PM   CREATININE 1.27 07/17/2014 12:17 PM   ALT 29 10/28/2017 02:52 PM   TSH 3.070 10/28/2017 02:52 PM   HGB 11.4 (L) 03/26/2018 11:19 AM     Lipids: Lab Results  Component Value Date/Time   LDLCALC 75 03/22/2015 08:16 AM   CHOL 134 03/22/2015 08:16 AM   TRIG 92 03/22/2015 08:16 AM   HDL 41 03/22/2015 08:16 AM       ASSESSMENT AND PLAN: 1. Coronary artery disease with a history of CABG: Symptomatically stable. Currently on Lipitor and lisinopril. He is not on aspirin as he is on Xarelto.   2. Persistent atrial fibrillation: Status post ablation in February 2019 and subsequent cardioversion. Symptomatically stable. Currently in a regular rhythm.  He is anticoagulated with Xarelto.  3. Hypertension: Blood pressure is elevated.  This will need further monitoring.  4. Hyperlipidemia: LDL 74 on 12/28/2018.  Continue atorvastatin 80 mg.    5. Carotid artery stenosis status post left CEA: Carotid Dopplers on 04/08/2018 showed near complete occlusion of the right common carotid and right internal carotid arteries with patent left carotid endarterectomy with 1-39% stenosis. Continue statin.He is on Xarelto for atrial fibrillation.  Disposition: Follow up 6 months    Kate Sable, [name], F.A.C.C.

## 2019-02-05 ENCOUNTER — Other Ambulatory Visit: Payer: Self-pay | Admitting: Cardiovascular Disease

## 2019-02-15 DIAGNOSIS — H2512 Age-related nuclear cataract, left eye: Secondary | ICD-10-CM | POA: Diagnosis not present

## 2019-02-15 DIAGNOSIS — H35372 Puckering of macula, left eye: Secondary | ICD-10-CM | POA: Diagnosis not present

## 2019-02-15 DIAGNOSIS — Z961 Presence of intraocular lens: Secondary | ICD-10-CM | POA: Diagnosis not present

## 2019-02-21 DIAGNOSIS — N3943 Post-void dribbling: Secondary | ICD-10-CM | POA: Diagnosis not present

## 2019-02-21 DIAGNOSIS — N401 Enlarged prostate with lower urinary tract symptoms: Secondary | ICD-10-CM | POA: Diagnosis not present

## 2019-02-21 DIAGNOSIS — N312 Flaccid neuropathic bladder, not elsewhere classified: Secondary | ICD-10-CM | POA: Diagnosis not present

## 2019-02-21 DIAGNOSIS — N200 Calculus of kidney: Secondary | ICD-10-CM | POA: Diagnosis not present

## 2019-02-27 DIAGNOSIS — H2512 Age-related nuclear cataract, left eye: Secondary | ICD-10-CM | POA: Diagnosis not present

## 2019-03-03 DIAGNOSIS — H2512 Age-related nuclear cataract, left eye: Secondary | ICD-10-CM | POA: Diagnosis not present

## 2019-03-18 ENCOUNTER — Other Ambulatory Visit: Payer: Self-pay | Admitting: Cardiovascular Disease

## 2019-03-24 ENCOUNTER — Other Ambulatory Visit: Payer: Self-pay

## 2019-03-24 MED ORDER — NITROGLYCERIN 0.4 MG SL SUBL: SUBLINGUAL_TABLET | SUBLINGUAL | 3 refills | Status: DC

## 2019-03-24 NOTE — Telephone Encounter (Signed)
Refilled NTG 

## 2019-03-28 DIAGNOSIS — Z23 Encounter for immunization: Secondary | ICD-10-CM | POA: Diagnosis not present

## 2019-04-01 DIAGNOSIS — Z96641 Presence of right artificial hip joint: Secondary | ICD-10-CM | POA: Diagnosis not present

## 2019-04-01 DIAGNOSIS — M7061 Trochanteric bursitis, right hip: Secondary | ICD-10-CM | POA: Diagnosis not present

## 2019-04-07 ENCOUNTER — Other Ambulatory Visit: Payer: Self-pay

## 2019-04-07 DIAGNOSIS — I6521 Occlusion and stenosis of right carotid artery: Secondary | ICD-10-CM

## 2019-04-07 DIAGNOSIS — I6522 Occlusion and stenosis of left carotid artery: Secondary | ICD-10-CM

## 2019-04-08 DIAGNOSIS — M6530 Trigger finger, unspecified finger: Secondary | ICD-10-CM | POA: Insufficient documentation

## 2019-04-08 DIAGNOSIS — M65342 Trigger finger, left ring finger: Secondary | ICD-10-CM | POA: Diagnosis not present

## 2019-04-14 ENCOUNTER — Telehealth (HOSPITAL_COMMUNITY): Payer: Self-pay | Admitting: *Deleted

## 2019-04-14 ENCOUNTER — Ambulatory Visit: Payer: Medicare Other | Admitting: Vascular Surgery

## 2019-04-14 ENCOUNTER — Encounter (HOSPITAL_COMMUNITY): Payer: Medicare Other

## 2019-04-14 ENCOUNTER — Ambulatory Visit: Payer: Medicare Other | Admitting: Family

## 2019-04-14 NOTE — Telephone Encounter (Signed)

## 2019-04-15 ENCOUNTER — Other Ambulatory Visit: Payer: Self-pay

## 2019-04-15 ENCOUNTER — Ambulatory Visit (INDEPENDENT_AMBULATORY_CARE_PROVIDER_SITE_OTHER): Payer: Medicare Other | Admitting: Family

## 2019-04-15 ENCOUNTER — Encounter: Payer: Self-pay | Admitting: Family

## 2019-04-15 ENCOUNTER — Ambulatory Visit (HOSPITAL_COMMUNITY)
Admission: RE | Admit: 2019-04-15 | Discharge: 2019-04-15 | Disposition: A | Discharge status: Home / Self Care | Payer: Medicare Other | Source: Ambulatory Visit | Attending: Family | Admitting: Family

## 2019-04-15 VITALS — BP 150/61 | HR 55 | Temp 97.2°F | Resp 16 | Ht 70.0 in | Wt 240.0 lb

## 2019-04-15 DIAGNOSIS — I6521 Occlusion and stenosis of right carotid artery: Secondary | ICD-10-CM | POA: Insufficient documentation

## 2019-04-15 DIAGNOSIS — I6522 Occlusion and stenosis of left carotid artery: Secondary | ICD-10-CM

## 2019-04-15 DIAGNOSIS — Z9889 Other specified postprocedural states: Secondary | ICD-10-CM

## 2019-04-15 NOTE — Patient Instructions (Signed)

## 2019-04-15 NOTE — Progress Notes (Signed)
Chief Complaint: Follow up Extracranial Carotid Artery Stenosis   History of Present Illness  Caleb Wolf is a 78 y.o. male who is s/p left carotid endarterectomy on 02-17-14 by Dr. Kellie Simmering.  He has a known right ICA occlusion.  He had a resection of a redundant segment of the left internal carotid artery with primary reanastomosis. He has had no neurologic symptoms since Dr. Kellie Simmering performed his surgery in September 2015 in conjunction with coronary artery bypass grafting.   He denies lower extremity claudication type symtoms.  He denies any known history of stroke or TIA. Specifically he deniesa history of amaurosis fugax or monocular blindness, unilateral facial drooping, hemiplegia, orreceptive or expressive aphasia.   Pt states his systolic blood pressure is usually in the 130's.    Diabetic: no Tobacco use: former smoker, quit in 1984, smoked x 25 years  Pt meds include: Statin : yes ASA: no Other anticoagulants/antiplatelets: Xarelto, hx of atrial fib, he had a cardiac ablation and cardioversion in February and March 2019  He feels much improved in a regular rhythm   Past Medical History:  Diagnosis Date  . Arthritis   . Basal cell carcinoma   . Carotid stenosis   . Coronary artery disease    Multvessel s/p CABG 2015  . Enlarged prostate   . Essential hypertension   . GERD (gastroesophageal reflux disease)   . Gout   . History of colon polyps   . History of kidney stones   . Hyperlipidemia   . Lumbar disc disease   . Persistent atrial fibrillation ([place])   . Sleep apnea    CPAP    Social History Social History   Tobacco Use  . Smoking status: Former Smoker    Packs/day: 1.50    Years: 25.00    Pack years: 37.50    Types: Cigarettes    Start date: 08/08/1960    Quit date: 06/02/1982    Years since quitting: 36.8  . Smokeless tobacco: Never Used  . Tobacco comment: quit smoking 30+yrs ago  Substance Use Topics  . Alcohol use: No    Alcohol/week:  0.0 standard drinks    Comment: occasionally   . Drug use: No    Family History Family History  Problem Relation Age of Onset  . Allergies Mother   . Heart disease Mother   . Hypertension Mother   . Heart disease Father        MI    Surgical History Past Surgical History:  Procedure Laterality Date  . ABLATION OF DYSRHYTHMIC FOCUS  07/28/2017  . ATRIAL FIBRILLATION ABLATION N/A 07/28/2017   Procedure: ATRIAL FIBRILLATION ABLATION;  Surgeon: Thompson Grayer, MD;  Location: [place] [facility];  Service: Cardiovascular;  Laterality: N/A;  . BACK SURGERY    . CARDIAC CATHETERIZATION  05/22/2014   Procedure: IABP INSERTION;  Surgeon: Leonie Man, MD;  Location: [facility] CATH LAB;  Service: Cardiovascular;;  . CARDIOVERSION N/A 04/29/2017   Procedure: CARDIOVERSION;  Surgeon: Satira Sark, MD;  [facility];  Service: Cardiovascular;  Laterality: N/A;  . CARDIOVERSION N/A 08/13/2017   Procedure: CARDIOVERSION;  Surgeon: Pixie Casino, MD;  Location: [facility] ENDOSCOPY;  Service: Cardiovascular;  Laterality: N/A;  . Cataract surgery Right   . COLONOSCOPY    . COLONOSCOPY N/A 12/30/2017   Procedure: COLONOSCOPY;  Surgeon: Rogene Houston, MD;  [facility];  Service: Endoscopy;  Laterality: N/A;  . CORONARY ARTERY BYPASS GRAFT N/A 05/22/2014  Procedure: CORONARY ARTERY BYPASS GRAFTING (CABG) times three using left internal mammary and right saphenous vein.;  Surgeon: Melrose Nakayama, MD;  Location: [facility];  Service: Open Heart Surgery;  Laterality: N/A;  . ENDARTERECTOMY Left 02/17/2014   Procedure: ENDARTERECTOMY CAROTID WITH PATCH ANGIOPLASTY;  Surgeon: Mal Misty, MD;  Location: [place];  Service: Vascular;  Laterality: Left;  . ESOPHAGEAL DILATION N/A 08/22/2015   Procedure: ESOPHAGEAL DILATION;  Surgeon: Rogene Houston, MD;  [facility];  Service: Endoscopy;  Laterality: N/A;  . ESOPHAGOGASTRODUODENOSCOPY N/A 08/22/2015   Procedure:  ESOPHAGOGASTRODUODENOSCOPY (EGD);  Surgeon: Rogene Houston, MD;  [facility];  Service: Endoscopy;  Laterality: N/A;  12:45 - moved to 1:55 - Ann notified pt  . ESOPHAGOGASTRODUODENOSCOPY N/A 12/30/2017   Procedure: ESOPHAGOGASTRODUODENOSCOPY (EGD);  Surgeon: Rogene Houston, MD;  [facility];  Service: Endoscopy;  Laterality: N/A;  200  . EYE SURGERY     cataract extraction (right) with repair macular tear , with IOL     right  . FRACTURE SURGERY     bilateral wrist fractures- one ORIF  . JOINT REPLACEMENT  2011   left knee  . LEFT HEART CATHETERIZATION WITH CORONARY ANGIOGRAM N/A 05/22/2014   Procedure: LEFT HEART CATHETERIZATION WITH CORONARY ANGIOGRAM;  Surgeon: Leonie Man, MD;  Location: [facility] CATH LAB;  Service: Cardiovascular;  Laterality: N/A;  . LITHOTRIPSY    . PARS PLANA VITRECTOMY W/ REPAIR OF MACULAR HOLE    . RHINOPLASTY    . TEE WITHOUT CARDIOVERSION N/A 04/29/2017   Procedure: TRANSESOPHAGEAL ECHOCARDIOGRAM (TEE) WITH PROPOFOL;  Surgeon: Satira Sark, MD;  [facility];  Service: Cardiovascular;  Laterality: N/A;  . TONSILLECTOMY    . TOTAL HIP ARTHROPLASTY  12/08/2011   Procedure: TOTAL HIP ARTHROPLASTY;  Surgeon: Gearlean Alf, MD;  [facility];  Service: Orthopedics;  Laterality: Right;  . UPPER GI ENDOSCOPY  12/18/2015   Procedure: UPPER GI ENDOSCOPY;  Surgeon: Ralene Ok, MD;  [facility]: [facility] OR[facility];  Service: General;;  . WRIST SURGERY Right 57yrs ago  . WRIST SURGERY Left     Allergies  Allergen Reactions  . Codeine Other (See Comments)  . Codeine Sulfate Nausea Only    Current Outpatient Medications  Medication Sig Dispense Refill  . atorvastatin (LIPITOR) 80 MG tablet TAKE 1 TABLET BY MOUTH EVERY EVENING 90 tablet 3  . colchicine 0.6 MG tablet Take 0.6 mg by mouth daily as needed (for gout flare up).     . Cyanocobalamin (B-12) 1000 MCG SUBL Place 1,000 mcg under the tongue daily.     . cyclobenzaprine  (FLEXERIL) 10 MG tablet Take 10 mg by mouth at bedtime as needed for muscle spasms  1  . doxazosin (CARDURA) 4 MG tablet Take 4 mg by mouth daily.  4  . finasteride (PROSCAR) 5 MG tablet Take 5 mg by mouth every evening.     . furosemide (LASIX) 40 MG tablet TAKE 1 TABLET BY MOUTH TWICE A DAY 180 tablet 1  . Iron-Vitamin C (VITRON-C) 65-125 MG TABS Take 1 tablet by mouth daily.    Marland Kitchen lisinopril (PRINIVIL,ZESTRIL) 40 MG tablet Take 40 mg by mouth daily.    . nitroGLYCERIN (NITROSTAT) 0.4 MG SL tablet Take as directed 25 tablet 3  . pantoprazole (PROTONIX) 40 MG tablet Take 1 tablet (40 mg total) by mouth daily before breakfast. (Patient taking differently: Take 40 mg by mouth every evening. ) 90 tablet 3  .  potassium chloride SA (KLOR-CON M20) 20 MEQ tablet Take 1 tablet (20 mEq total) by mouth 2 (two) times daily. (Patient taking differently: Take 20 mEq by mouth daily. ) 180 tablet 0  . rivaroxaban (XARELTO) 20 MG TABS tablet Take 1 tablet (20 mg total) by mouth daily. 30 tablet 11  . amoxicillin (AMOXIL) 500 MG capsule Use as directed     No current facility-administered medications for this visit.     Review of Systems : See HPI for pertinent positives and negatives.  Physical Examination  Vitals:   04/15/19 1437 04/15/19 1441  BP: (!) 147/62 (!) 150/61  Pulse: (!) 56 (!) 55  Resp: 16   Temp: (!) 97.2 F (36.2 C)   TempSrc: Temporal   SpO2: 97%   Weight: 240 lb (108.9 kg)   Height: 5\' 10"  (1.778 m)    Body mass index is 34.44 kg/m.  General: WDWN obese male in NAD GAIT: normal Eyes: Pupils are equal and round HENT: No gross abnormalities.  Pulmonary:  Respirations are non-labored, good air movement in all fields, no rales, rhonchi, or  wheezes Cardiac: Irregular rhythm, no detected murmur.  VASCULAR EXAM Carotid Bruits Right Left   Negative Negative     Abdominal aortic pulse is not palpable. Radial pulses are 1+ right and 2+ left palpable                                                                                                                          LE Pulses Right Left       POPLITEAL  not palpable   not palpable       POSTERIOR TIBIAL  faintly palpable   not palpable        DORSALIS PEDIS      ANTERIOR TIBIAL not palpable  not palpable     Gastrointestinal: soft, nontender, BS WNL, no r/g, no palpable masses. Musculoskeletal: no muscle atrophy/wasting. M/S 5/5 throughout, extremities without ischemic changes. Left lower leg and ankle with 1-2+ pitting edema, right is trace to 1+ pitting edema. Skin: No rashes, no ulcers, no cellulitis.   Neurologic:  A&O X 3; appropriate affect, sensation is normal; speech is normal, CN 2-12 intact, pain and light touch intact in extremities, motor exam as listed above. Psychiatric: Normal thought content, mood appropriate to clinical situation.    DATA  Carotid Duplex (04-15-19): Right Carotid: Evidence consistent with a total occlusion of the right ICA. The CCA appears occluded. Left Carotid: Velocities in the left ICA are consistent with a 1-39% stenosis. Vertebrals:  Bilateral vertebral arteries demonstrate antegrade flow. Subclavians: Normal flow hemodynamics were seen in bilateral subclavian arteries. No significant change compared to the examson10/18/16, 03-25-16,03-31-17, and 04-08-18.   Assessment: Caleb Wolf is a 78 y.o. male who is s/p resection of a redundant segment of the left internal carotid artery with primary reanastomosis in September 2015 in conjunction with coronary artery bypass grafting.  He has no history of stroke or  TIA.   Today's carotid duplex shows a total occlusion of the right ICA and CCA and 1-39% stenosis in the left ICA. No significant change compared to the examson10/18/16, 03-25-16,03-31-17, and 04-08-18.   His atherosclerotic risk factors include 25 year history of smoking (quit in 1984), CAD, atrial fib, and dyslipidemia.  He takes Xarelto and a statin.    1-2+ pitting edema in left lower leg and ankle: he wears knee high compression hose which he states helps.    Plan: Follow-up in 1 year with Carotid Duplex scan.   I discussed in depth with the patient the nature of atherosclerosis, and emphasized the importance of maximal medical management including strict control of blood pressure, blood glucose, and lipid levels, obtaining regular exercise, and continued cessation of smoking.  The patient is aware that without maximal medical management the underlying atherosclerotic disease process will progress, limiting the benefit of any interventions. The patient was given information about stroke prevention and what symptoms should prompt the patient to seek immediate medical care. Thank you for allowing [place] to participate in this patient's care.  Clemon Chambers, RN, MSN, FNP-C [facility] Office: [phone]  Clinic Physician: Donzetta Matters  04/15/19 2:46 PM

## 2019-04-19 ENCOUNTER — Other Ambulatory Visit: Payer: Self-pay

## 2019-04-19 DIAGNOSIS — N183 Chronic kidney disease, stage 3 unspecified: Secondary | ICD-10-CM | POA: Diagnosis not present

## 2019-04-19 DIAGNOSIS — I5022 Chronic systolic (congestive) heart failure: Secondary | ICD-10-CM | POA: Diagnosis not present

## 2019-04-19 DIAGNOSIS — D649 Anemia, unspecified: Secondary | ICD-10-CM | POA: Diagnosis not present

## 2019-04-19 DIAGNOSIS — I2510 Atherosclerotic heart disease of native coronary artery without angina pectoris: Secondary | ICD-10-CM | POA: Diagnosis not present

## 2019-04-19 DIAGNOSIS — M109 Gout, unspecified: Secondary | ICD-10-CM | POA: Diagnosis not present

## 2019-04-19 DIAGNOSIS — I6521 Occlusion and stenosis of right carotid artery: Secondary | ICD-10-CM

## 2019-04-19 DIAGNOSIS — I6522 Occlusion and stenosis of left carotid artery: Secondary | ICD-10-CM

## 2019-04-25 DIAGNOSIS — N1831 Chronic kidney disease, stage 3a: Secondary | ICD-10-CM | POA: Diagnosis not present

## 2019-04-25 DIAGNOSIS — I480 Paroxysmal atrial fibrillation: Secondary | ICD-10-CM | POA: Diagnosis not present

## 2019-04-25 DIAGNOSIS — I5022 Chronic systolic (congestive) heart failure: Secondary | ICD-10-CM | POA: Diagnosis not present

## 2019-06-09 DIAGNOSIS — M25551 Pain in right hip: Secondary | ICD-10-CM | POA: Insufficient documentation

## 2019-06-14 DIAGNOSIS — Z23 Encounter for immunization: Secondary | ICD-10-CM | POA: Diagnosis not present

## 2019-07-15 DIAGNOSIS — Z23 Encounter for immunization: Secondary | ICD-10-CM | POA: Diagnosis not present

## 2019-07-20 DIAGNOSIS — I480 Paroxysmal atrial fibrillation: Secondary | ICD-10-CM | POA: Diagnosis not present

## 2019-07-20 DIAGNOSIS — Z79899 Other long term (current) drug therapy: Secondary | ICD-10-CM | POA: Diagnosis not present

## 2019-07-20 DIAGNOSIS — I5022 Chronic systolic (congestive) heart failure: Secondary | ICD-10-CM | POA: Diagnosis not present

## 2019-07-20 DIAGNOSIS — N183 Chronic kidney disease, stage 3 unspecified: Secondary | ICD-10-CM | POA: Diagnosis not present

## 2019-07-22 ENCOUNTER — Ambulatory Visit: Payer: Medicare Other | Admitting: Internal Medicine

## 2019-07-27 DIAGNOSIS — L821 Other seborrheic keratosis: Secondary | ICD-10-CM | POA: Diagnosis not present

## 2019-07-27 DIAGNOSIS — C44319 Basal cell carcinoma of skin of other parts of face: Secondary | ICD-10-CM | POA: Diagnosis not present

## 2019-07-27 DIAGNOSIS — L570 Actinic keratosis: Secondary | ICD-10-CM | POA: Diagnosis not present

## 2019-07-27 DIAGNOSIS — D485 Neoplasm of uncertain behavior of skin: Secondary | ICD-10-CM | POA: Diagnosis not present

## 2019-07-27 DIAGNOSIS — Z85828 Personal history of other malignant neoplasm of skin: Secondary | ICD-10-CM | POA: Diagnosis not present

## 2019-08-03 DIAGNOSIS — N1831 Chronic kidney disease, stage 3a: Secondary | ICD-10-CM | POA: Diagnosis not present

## 2019-08-03 DIAGNOSIS — I5022 Chronic systolic (congestive) heart failure: Secondary | ICD-10-CM | POA: Diagnosis not present

## 2019-08-03 DIAGNOSIS — I480 Paroxysmal atrial fibrillation: Secondary | ICD-10-CM | POA: Diagnosis not present

## 2019-08-05 ENCOUNTER — Telehealth: Payer: Self-pay

## 2019-08-05 ENCOUNTER — Telehealth: Payer: Self-pay | Admitting: Internal Medicine

## 2019-08-05 MED ORDER — DILTIAZEM HCL 30 MG PO TABS: 30.0000 mg | ORAL_TABLET | Freq: Four times a day (QID) | ORAL | 0 refills | Status: DC | PRN

## 2019-08-05 NOTE — Telephone Encounter (Signed)
   STAT if HR is under 50 or over 120 (normal HR is 60-100 beats per minute)  1) What is your heart rate? 155  2) Do you have a log of your heart rate readings (document readings)? 149, 155  3) Do you have any other symptoms? His apple watch alert him he had afib last nigh

## 2019-08-05 NOTE — Telephone Encounter (Signed)
Reviewed w/ DOD, Dr. Lovena Le. Order for Diltiazem 30 mg PRN Q6H for palpitations/elevated heart rates. Advised to call back if this did not help and/or symptoms begin. Otherwise plan is to discuss things further w/ [name] at his Mychart visit on Monday. Pt agreeable to plan.

## 2019-08-05 NOTE — Telephone Encounter (Signed)
AFib since about 1:00 am this morning, it woke him up. HRs  avg 120-130s Still in AFib with elevated HRs.  Reports "if I stand up too long I feel like I am going to pass out" he is experiecing palpitations sensation. No SOB, edema, weight gain, fatigue, weakness. Morning VS: 0730  122/74, HR 120 1000    111/67, HR 145  Pt aware I will discuss options w/ DOD and call him back w/ recommendation.

## 2019-08-05 NOTE — Telephone Encounter (Signed)
Follow up    Pt is calling back and is still waiting for a call from the nurse    Please call back

## 2019-08-07 ENCOUNTER — Telehealth: Payer: Self-pay | Admitting: Physician Assistant

## 2019-08-07 NOTE — Telephone Encounter (Signed)
79 yo male with CAD, carotid dz, s/p CABG+CEA in 2015, persistent AFib s/p prior PVI ablation.  He called the office last week with chest pain and rapid palpitations (HR 150s).  He noted he was in AFib per his Apple watch.  He was Rx'd Diltiazem 30 mg 4 times a day.  He was told to call back if he did not go back into normal sinus rhythm in the next 48 hours.   He called the answering service today b/c he is still in atrial fibrillation.  His HR is now in the 80s.  He has not had any further chest pain or significant shortness of breath.  He has not had syncope or near syncope.  He has an appt with [name] tomorrow via Telemedicine.  PLAN:  I have asked him to keep his appt tomorrow with [name]. He should continue on Diltiazem as Rx'd.  He should go to the ED if he has recurrent chest pain or rapid HR's like he did 2 days ago.  Richardson Dopp, PA-C    08/07/2019 4:12 PM

## 2019-08-08 ENCOUNTER — Encounter: Payer: Self-pay | Admitting: Internal Medicine

## 2019-08-08 ENCOUNTER — Telehealth: Payer: Self-pay

## 2019-08-08 ENCOUNTER — Telehealth (INDEPENDENT_AMBULATORY_CARE_PROVIDER_SITE_OTHER): Payer: Medicare Other | Admitting: Internal Medicine

## 2019-08-08 VITALS — BP 106/67 | HR 130 | Ht 70.0 in | Wt 240.0 lb

## 2019-08-08 DIAGNOSIS — I4819 Other persistent atrial fibrillation: Secondary | ICD-10-CM

## 2019-08-08 DIAGNOSIS — D6869 Other thrombophilia: Secondary | ICD-10-CM

## 2019-08-08 DIAGNOSIS — Z951 Presence of aortocoronary bypass graft: Secondary | ICD-10-CM | POA: Diagnosis not present

## 2019-08-08 DIAGNOSIS — E785 Hyperlipidemia, unspecified: Secondary | ICD-10-CM

## 2019-08-08 DIAGNOSIS — I2510 Atherosclerotic heart disease of native coronary artery without angina pectoris: Secondary | ICD-10-CM | POA: Diagnosis not present

## 2019-08-08 DIAGNOSIS — I10 Essential (primary) hypertension: Secondary | ICD-10-CM

## 2019-08-08 DIAGNOSIS — G4733 Obstructive sleep apnea (adult) (pediatric): Secondary | ICD-10-CM

## 2019-08-08 NOTE — Progress Notes (Signed)
Electrophysiology TeleHealth Note   Due to national recommendations of social distancing due to COVID 19, an audio/video telehealth visit is felt to be most appropriate for this patient at this time.  See MyChart message from today for the patient's consent to telehealth for [facility].  Date:  08/08/2019   ID:  Caleb Wolf, DOB [birth year], MRN [id number]  Location: patient's home  Provider location:  [facility]  Evaluation Performed: Follow-up visit  PCP:  Asencion Noble, MD   Electrophysiologist:  Dr Rayann Heman  Chief Complaint:  palpitations  History of Present Illness:    Caleb Wolf is a 79 y.o. male who presents via telehealth conferencing today.  Since last being seen in our clinic, the patient reports doing reasonably well.  He had very rare palpitations until this past Thursday evening.  He went into sustained AF at that time.  this has been confirmed by his apple watch.  I have asked that he send the tracings to me.  + palpitations and fatigue.  He has taken diltiazem with some improvements in his heart rates.  Today, he denies symptoms of  chest pain, shortness of breath,  lower extremity edema, dizziness, presyncope, or syncope.  The patient is otherwise without complaint today.     Past Medical History:  Diagnosis Date  . Arthritis   . Basal cell carcinoma   . Carotid stenosis   . Coronary artery disease    Multvessel s/p CABG 2015  . Enlarged prostate   . Essential hypertension   . GERD (gastroesophageal reflux disease)   . Gout   . History of colon polyps   . History of kidney stones   . Hyperlipidemia   . Lumbar disc disease   . Persistent atrial fibrillation ([place])   . Sleep apnea    CPAP    Past Surgical History:  Procedure Laterality Date  . ABLATION OF DYSRHYTHMIC FOCUS  07/28/2017  . ATRIAL FIBRILLATION ABLATION N/A 07/28/2017   Procedure: ATRIAL FIBRILLATION ABLATION;  Surgeon: Thompson Grayer, MD;  Location: [place] [facility] LAB;  Service:  Cardiovascular;  Laterality: N/A;  . BACK SURGERY    . CARDIAC CATHETERIZATION  05/22/2014   Procedure: IABP INSERTION;  Surgeon: Leonie Man, MD;  Location: [facility] CATH LAB;  Service: Cardiovascular;;  . CARDIOVERSION N/A 04/29/2017   Procedure: CARDIOVERSION;  Surgeon: Satira Sark, MD;  [facility];  Service: Cardiovascular;  Laterality: N/A;  . CARDIOVERSION N/A 08/13/2017   Procedure: CARDIOVERSION;  Surgeon: Pixie Casino, MD;  Location: [facility] ENDOSCOPY;  Service: Cardiovascular;  Laterality: N/A;  . Cataract surgery Right   . COLONOSCOPY    . COLONOSCOPY N/A 12/30/2017   Procedure: COLONOSCOPY;  Surgeon: Rogene Houston, MD;  [facility];  Service: Endoscopy;  Laterality: N/A;  . CORONARY ARTERY BYPASS GRAFT N/A 05/22/2014   Procedure: CORONARY ARTERY BYPASS GRAFTING (CABG) times three using left internal mammary and right saphenous vein.;  Surgeon: Melrose Nakayama, MD;  Location: [place];  Service: Open Heart Surgery;  Laterality: N/A;  . ENDARTERECTOMY Left 02/17/2014   Procedure: ENDARTERECTOMY CAROTID WITH PATCH ANGIOPLASTY;  Surgeon: Mal Misty, MD;  Location: [place];  Service: Vascular;  Laterality: Left;  . ESOPHAGEAL DILATION N/A 08/22/2015   Procedure: ESOPHAGEAL DILATION;  Surgeon: Rogene Houston, MD;  [facility];  Service: Endoscopy;  Laterality: N/A;  . ESOPHAGOGASTRODUODENOSCOPY N/A 08/22/2015   Procedure: ESOPHAGOGASTRODUODENOSCOPY (EGD);  Surgeon: Rogene Houston, MD;  [facility];  Service: Endoscopy;  Laterality: N/A;  12:45 - moved to 1:55 - Ann notified pt  . ESOPHAGOGASTRODUODENOSCOPY N/A 12/30/2017   Procedure: ESOPHAGOGASTRODUODENOSCOPY (EGD);  Surgeon: Rogene Houston, MD;  [facility];  Service: Endoscopy;  Laterality: N/A;  200  . EYE SURGERY     cataract extraction (right) with repair macular tear , with IOL     right  . FRACTURE SURGERY     bilateral wrist fractures- one ORIF  . JOINT  REPLACEMENT  2011   left knee  . LEFT HEART CATHETERIZATION WITH CORONARY ANGIOGRAM N/A 05/22/2014   Procedure: LEFT HEART CATHETERIZATION WITH CORONARY ANGIOGRAM;  Surgeon: Leonie Man, MD;  Location: [facility] CATH LAB;  Service: Cardiovascular;  Laterality: N/A;  . LITHOTRIPSY    . PARS PLANA VITRECTOMY W/ REPAIR OF MACULAR HOLE    . RHINOPLASTY    . TEE WITHOUT CARDIOVERSION N/A 04/29/2017   Procedure: TRANSESOPHAGEAL ECHOCARDIOGRAM (TEE) WITH PROPOFOL;  Surgeon: Satira Sark, MD;  [facility];  Service: Cardiovascular;  Laterality: N/A;  . TONSILLECTOMY    . TOTAL HIP ARTHROPLASTY  12/08/2011   Procedure: TOTAL HIP ARTHROPLASTY;  Surgeon: Gearlean Alf, MD;  [facility];  Service: Orthopedics;  Laterality: Right;  . UPPER GI ENDOSCOPY  12/18/2015   Procedure: UPPER GI ENDOSCOPY;  Surgeon: Ralene Ok, MD;  [facility];  Service: General;;  . WRIST SURGERY Right 37yrs ago  . WRIST SURGERY Left     Current Outpatient Medications  Medication Sig Dispense Refill  . atorvastatin (LIPITOR) 80 MG tablet TAKE 1 TABLET BY MOUTH EVERY EVENING 90 tablet 3  . colchicine 0.6 MG tablet Take 0.6 mg by mouth daily as needed (for gout flare up).     . Cyanocobalamin (B-12) 1000 MCG SUBL Place 1,000 mcg under the tongue daily.     . cyclobenzaprine (FLEXERIL) 10 MG tablet Take 10 mg by mouth at bedtime as needed for muscle spasms  1  . diltiazem (CARDIZEM) 30 MG tablet Take 1 tablet (30 mg total) by mouth 4 (four) times daily as needed (for palpitations/elevated heart rates). 30 tablet 0  . doxazosin (CARDURA) 4 MG tablet Take 4 mg by mouth daily.  4  . finasteride (PROSCAR) 5 MG tablet Take 5 mg by mouth every evening.     . furosemide (LASIX) 40 MG tablet TAKE 1 TABLET BY MOUTH TWICE A DAY 180 tablet 1  . Iron-Vitamin C (VITRON-C) 65-125 MG TABS Take 1 tablet by mouth daily.    Marland Kitchen lisinopril (PRINIVIL,ZESTRIL) 40 MG tablet Take 40 mg by mouth daily.    . nitroGLYCERIN  (NITROSTAT) 0.4 MG SL tablet Take as directed 25 tablet 3  . pantoprazole (PROTONIX) 40 MG tablet Take 1 tablet (40 mg total) by mouth daily before breakfast. (Patient taking differently: Take 40 mg by mouth every evening. ) 90 tablet 3  . potassium chloride SA (KLOR-CON M20) 20 MEQ tablet Take 1 tablet (20 mEq total) by mouth 2 (two) times daily. (Patient taking differently: Take 20 mEq by mouth daily. ) 180 tablet 0  . rivaroxaban (XARELTO) 20 MG TABS tablet Take 1 tablet (20 mg total) by mouth daily. 30 tablet 11   No current facility-administered medications for this visit.    Allergies:   Codeine and Codeine sulfate   Social History:  The patient  reports that he quit smoking about 37 years ago. His smoking use included cigarettes. He started smoking  about 59 years ago. He has a 37.50 pack-year smoking history. He has never used smokeless tobacco. He reports that he does not drink alcohol or use drugs.   Family History:  The patient's  family history includes Allergies in his mother; Heart disease in his father and mother; Hypertension in his mother.   ROS:  Please see the history of present illness.   All other systems are personally reviewed and negative.    Exam:    Vital Signs:  BP 106/67   Pulse (!) 130   Ht 5\' 10"  (1.778 m)   Wt 240 lb (108.9 kg)   BMI 34.44 kg/m   Well sounding and appearing, alert and conversant, regular work of breathing,  good skin color Eyes- anicteric, neuro- grossly intact, skin- no apparent rash or lesions or cyanosis, mouth- oral mucosa is pink  Labs/Other Tests and Data Reviewed:    Recent Labs: No results found for requested labs within last 8760 hours.   Wt Readings from Last 3 Encounters:  08/08/19 240 lb (108.9 kg)  04/15/19 240 lb (108.9 kg)  02/03/19 244 lb (110.7 kg)      ASSESSMENT & PLAN:    1.  Persistent atrial fibrillation Well controlled post ablation off AAD therapy until this past week.  He now finds himself back in  afib. chad2vasc score is 4.  He is on xarelto.  He has not missed any doses.  I would advise cardioversion at this time.  He wishes to have this done at [facility].  Risks and benefits to cardioversion including risks of stroke and sedation were discussed at length today.  He accepts risks and wishes to proceed. We will obtain echo in 3 months after back in sinus to evaluate for causes of his recent decompensation.  2. 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 Overweight Lifestyle modification is encouraged  5. CAD s/p CABG No ischemic symptoms  6. HTN Stable No change required today  7. HL Continue atorvastatin  Follow-up: 1 week post cardioversion in the AF clinic Follow-up with me in 3 months   Patient Risk:  after full review of this patients clinical status, I feel that they are at high risk at this time.  Today, I have spent 15 minutes with the patient with telehealth technology discussing arrhythmia management .    Army Fossa, MD  08/08/2019 10:29 AM     [address] [place] [address] [phone] (office) [phone] (fax)

## 2019-08-08 NOTE — Telephone Encounter (Signed)
-----   Message from Caleb Grayer, MD sent at 08/08/2019 10:44 AM EST ----- Please schedule cardioversion at next available time at [facility].  Follow-up in AF clinic 1 week after cardioversion Follow-up with me with an echo in 3 months

## 2019-08-08 NOTE — H&P (View-Only) (Signed)
Electrophysiology TeleHealth Note   Due to national recommendations of social distancing due to COVID 19, an audio/video telehealth visit is felt to be most appropriate for this patient at this time.  See MyChart message from today for the patient's consent to telehealth for [facility].  Date:  08/08/2019   ID:  Caleb Wolf, DOB [birth year], MRN [id number]  Location: patient's home  Provider location:  [facility]  Evaluation Performed: Follow-up visit  PCP:  Asencion Noble, MD   Electrophysiologist:  Dr Rayann Heman  Chief Complaint:  palpitations  History of Present Illness:    Caleb Wolf is a 79 y.o. male who presents via telehealth conferencing today.  Since last being seen in our clinic, the patient reports doing reasonably well.  He had very rare palpitations until this past Thursday evening.  He went into sustained AF at that time.  this has been confirmed by his apple watch.  I have asked that he send the tracings to me.  + palpitations and fatigue.  He has taken diltiazem with some improvements in his heart rates.  Today, he denies symptoms of  chest pain, shortness of breath,  lower extremity edema, dizziness, presyncope, or syncope.  The patient is otherwise without complaint today.     Past Medical History:  Diagnosis Date  . Arthritis   . Basal cell carcinoma   . Carotid stenosis   . Coronary artery disease    Multvessel s/p CABG 2015  . Enlarged prostate   . Essential hypertension   . GERD (gastroesophageal reflux disease)   . Gout   . History of colon polyps   . History of kidney stones   . Hyperlipidemia   . Lumbar disc disease   . Persistent atrial fibrillation ([place])   . Sleep apnea    CPAP    Past Surgical History:  Procedure Laterality Date  . ABLATION OF DYSRHYTHMIC FOCUS  07/28/2017  . ATRIAL FIBRILLATION ABLATION N/A 07/28/2017   Procedure: ATRIAL FIBRILLATION ABLATION;  Surgeon: Thompson Grayer, MD;  Location: [place] CV LAB;  Service:  Cardiovascular;  Laterality: N/A;  . BACK SURGERY    . CARDIAC CATHETERIZATION  05/22/2014   Procedure: IABP INSERTION;  Surgeon: Leonie Man, MD;  Location: [facility] CATH LAB;  Service: Cardiovascular;;  . CARDIOVERSION N/A 04/29/2017   Procedure: CARDIOVERSION;  Surgeon: Satira Sark, MD;  [facility]: [facility];  Service: Cardiovascular;  Laterality: N/A;  . CARDIOVERSION N/A 08/13/2017   Procedure: CARDIOVERSION;  Surgeon: Pixie Casino, MD;  Location: [facility] ENDOSCOPY;  Service: Cardiovascular;  Laterality: N/A;  . Cataract surgery Right   . COLONOSCOPY    . COLONOSCOPY N/A 12/30/2017   Procedure: COLONOSCOPY;  Surgeon: Rogene Houston, MD;  Location: [facility];  Service: Endoscopy;  Laterality: N/A;  . CORONARY ARTERY BYPASS GRAFT N/A 05/22/2014   Procedure: CORONARY ARTERY BYPASS GRAFTING (CABG) times three using left internal mammary and right saphenous vein.;  Surgeon: Melrose Nakayama, MD;  Location: [place];  Service: Open Heart Surgery;  Laterality: N/A;  . ENDARTERECTOMY Left 02/17/2014   Procedure: ENDARTERECTOMY CAROTID WITH PATCH ANGIOPLASTY;  Surgeon: Mal Misty, MD;  Location: [place];  Service: Vascular;  Laterality: Left;  . ESOPHAGEAL DILATION N/A 08/22/2015   Procedure: ESOPHAGEAL DILATION;  Surgeon: Rogene Houston, MD;  [facility]: [facility];  Service: Endoscopy;  Laterality: N/A;  . ESOPHAGOGASTRODUODENOSCOPY N/A 08/22/2015   Procedure: ESOPHAGOGASTRODUODENOSCOPY (EGD);  Surgeon: Rogene Houston, MD;  [facility];  Service: Endoscopy;  Laterality: N/A;  12:45 - moved to 1:55 - Ann notified pt  . ESOPHAGOGASTRODUODENOSCOPY N/A 12/30/2017   Procedure: ESOPHAGOGASTRODUODENOSCOPY (EGD);  Surgeon: Rogene Houston, MD;  [facility];  Service: Endoscopy;  Laterality: N/A;  200  . EYE SURGERY     cataract extraction (right) with repair macular tear , with IOL     right  . FRACTURE SURGERY     bilateral wrist fractures- one ORIF  . JOINT  REPLACEMENT  2011   left knee  . LEFT HEART CATHETERIZATION WITH CORONARY ANGIOGRAM N/A 05/22/2014   Procedure: LEFT HEART CATHETERIZATION WITH CORONARY ANGIOGRAM;  Surgeon: Leonie Man, MD;  Location: [facility] CATH LAB;  Service: Cardiovascular;  Laterality: N/A;  . LITHOTRIPSY    . PARS PLANA VITRECTOMY W/ REPAIR OF MACULAR HOLE    . RHINOPLASTY    . TEE WITHOUT CARDIOVERSION N/A 04/29/2017   Procedure: TRANSESOPHAGEAL ECHOCARDIOGRAM (TEE) WITH PROPOFOL;  Surgeon: Satira Sark, MD;  [facility];  Service: Cardiovascular;  Laterality: N/A;  . TONSILLECTOMY    . TOTAL HIP ARTHROPLASTY  12/08/2011   Procedure: TOTAL HIP ARTHROPLASTY;  Surgeon: Gearlean Alf, MD;  Location: [facility] ORS;  Service: Orthopedics;  Laterality: Right;  . UPPER GI ENDOSCOPY  12/18/2015   Procedure: UPPER GI ENDOSCOPY;  Surgeon: Ralene Ok, MD;  Location: [facility] ORS;  Service: General;;  . WRIST SURGERY Right 84yrs ago  . WRIST SURGERY Left     Current Outpatient Medications  Medication Sig Dispense Refill  . atorvastatin (LIPITOR) 80 MG tablet TAKE 1 TABLET BY MOUTH EVERY EVENING 90 tablet 3  . colchicine 0.6 MG tablet Take 0.6 mg by mouth daily as needed (for gout flare up).     . Cyanocobalamin (B-12) 1000 MCG SUBL Place 1,000 mcg under the tongue daily.     . cyclobenzaprine (FLEXERIL) 10 MG tablet Take 10 mg by mouth at bedtime as needed for muscle spasms  1  . diltiazem (CARDIZEM) 30 MG tablet Take 1 tablet (30 mg total) by mouth 4 (four) times daily as needed (for palpitations/elevated heart rates). 30 tablet 0  . doxazosin (CARDURA) 4 MG tablet Take 4 mg by mouth daily.  4  . finasteride (PROSCAR) 5 MG tablet Take 5 mg by mouth every evening.     . furosemide (LASIX) 40 MG tablet TAKE 1 TABLET BY MOUTH TWICE A DAY 180 tablet 1  . Iron-Vitamin C (VITRON-C) 65-125 MG TABS Take 1 tablet by mouth daily.    Marland Kitchen lisinopril (PRINIVIL,ZESTRIL) 40 MG tablet Take 40 mg by mouth daily.    . nitroGLYCERIN  (NITROSTAT) 0.4 MG SL tablet Take as directed 25 tablet 3  . pantoprazole (PROTONIX) 40 MG tablet Take 1 tablet (40 mg total) by mouth daily before breakfast. (Patient taking differently: Take 40 mg by mouth every evening. ) 90 tablet 3  . potassium chloride SA (KLOR-CON M20) 20 MEQ tablet Take 1 tablet (20 mEq total) by mouth 2 (two) times daily. (Patient taking differently: Take 20 mEq by mouth daily. ) 180 tablet 0  . rivaroxaban (XARELTO) 20 MG TABS tablet Take 1 tablet (20 mg total) by mouth daily. 30 tablet 11   No current facility-administered medications for this visit.    Allergies:   Codeine and Codeine sulfate   Social History:  The patient  reports that he quit smoking about 37 years ago. His smoking use included cigarettes. He started smoking  about 59 years ago. He has a 37.50 pack-year smoking history. He has never used smokeless tobacco. He reports that he does not drink alcohol or use drugs.   Family History:  The patient's  family history includes Allergies in his mother; Heart disease in his father and mother; Hypertension in his mother.   ROS:  Please see the history of present illness.   All other systems are personally reviewed and negative.    Exam:    Vital Signs:  BP 106/67   Pulse (!) 130   Ht 5\' 10"  (1.778 m)   Wt 240 lb (108.9 kg)   BMI 34.44 kg/m   Well sounding and appearing, alert and conversant, regular work of breathing,  good skin color Eyes- anicteric, neuro- grossly intact, skin- no apparent rash or lesions or cyanosis, mouth- oral mucosa is pink  Labs/Other Tests and Data Reviewed:    Recent Labs: No results found for requested labs within last 8760 hours.   Wt Readings from Last 3 Encounters:  08/08/19 240 lb (108.9 kg)  04/15/19 240 lb (108.9 kg)  02/03/19 244 lb (110.7 kg)      ASSESSMENT & PLAN:    1.  Persistent atrial fibrillation Well controlled post ablation off AAD therapy until this past week.  He now finds himself back in  afib. chad2vasc score is 4.  He is on xarelto.  He has not missed any doses.  I would advise cardioversion at this time.  He wishes to have this done at [facility].  Risks and benefits to cardioversion including risks of stroke and sedation were discussed at length today.  He accepts risks and wishes to proceed. We will obtain echo in 3 months after back in sinus to evaluate for causes of his recent decompensation.  2. 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 Overweight Lifestyle modification is encouraged  5. CAD s/p CABG No ischemic symptoms  6. HTN Stable No change required today  7. HL Continue atorvastatin  Follow-up: 1 week post cardioversion in the AF clinic Follow-up with me in 3 months   Patient Risk:  after full review of this patients clinical status, I feel that they are at high risk at this time.  Today, I have spent 15 minutes with the patient with telehealth technology discussing arrhythmia management .    Army Fossa, MD  08/08/2019 10:29 AM     [address] [place] [address] [phone] (office) [phone] (fax)

## 2019-08-08 NOTE — Telephone Encounter (Signed)
-----   Message from Thompson Grayer, MD sent at 08/08/2019 10:38 AM EST ----- Please schedule cardioversion  He prefers to have it at [facility] over [place]. He is compliant with his anticoagulation  Follow-up in AF clinic 1-2 weeks post cardioversion Follow-up with me in 3 months

## 2019-08-09 NOTE — Telephone Encounter (Signed)
Spoke with patient, he is aware of appt 08/22/19 with Roderic Palau, NP for 1 wk f/u dccv on 3/15.

## 2019-08-09 NOTE — Telephone Encounter (Signed)
Pt scheduled for DCCV at [facility] on August 15, 2019 at 8:00 am.  Will get labs prior.  Scheduled for covid test at [facility] on March 12 at 2:00 pm.  Work up complete

## 2019-08-12 ENCOUNTER — Other Ambulatory Visit (HOSPITAL_COMMUNITY)
Admission: RE | Admit: 2019-08-12 | Discharge: 2019-08-12 | Disposition: A | Discharge status: Home / Self Care | Payer: Medicare Other | Source: Ambulatory Visit | Attending: Cardiology | Admitting: Cardiology

## 2019-08-12 ENCOUNTER — Other Ambulatory Visit: Payer: Self-pay

## 2019-08-12 DIAGNOSIS — Z20822 Contact with and (suspected) exposure to covid-19: Secondary | ICD-10-CM | POA: Insufficient documentation

## 2019-08-12 DIAGNOSIS — Z01812 Encounter for preprocedural laboratory examination: Secondary | ICD-10-CM | POA: Diagnosis not present

## 2019-08-13 LAB — SARS CORONAVIRUS 2 (TAT 6-24 HRS): SARS Coronavirus 2: NEGATIVE

## 2019-08-14 NOTE — Anesthesia Preprocedure Evaluation (Addendum)
Anesthesia Evaluation  Patient identified by MRN, date of birth, ID band Patient awake    Reviewed: Allergy & Precautions, H&P , NPO status , Patient's Chart, lab work & pertinent test results  Airway Mallampati: II  TM Distance: >3 FB Neck ROM: Full    Dental no notable dental hx. (+) Teeth Intact, Dental Advisory Given   Pulmonary sleep apnea , former smoker,    Pulmonary exam normal breath sounds clear to auscultation       Cardiovascular Exercise Tolerance: Good hypertension, Pt. on medications + CAD, + Past MI, + CABG and +CHF  + dysrhythmias Atrial Fibrillation  Rhythm:Regular Rate:Normal     Neuro/Psych negative neurological ROS  negative psych ROS   GI/Hepatic Neg liver ROS, GERD  Medicated and Controlled,  Endo/Other  negative endocrine ROS  Renal/GU Renal disease  negative genitourinary   Musculoskeletal  (+) Arthritis ,   Abdominal   Peds  Hematology  (+) Blood dyscrasia, anemia ,   Anesthesia Other Findings   Reproductive/Obstetrics negative OB ROS                            Anesthesia Physical Anesthesia Plan  ASA: III  Anesthesia Plan: General   Post-op Pain Management:    Induction: Intravenous  PONV Risk Score and Plan: 2 and Treatment may vary due to age or medical condition and Propofol infusion  Airway Management Planned: Mask  Additional Equipment:   Intra-op Plan:   Post-operative Plan:   Informed Consent: I have reviewed the patients History and Physical, chart, labs and discussed the procedure including the risks, benefits and alternatives for the proposed anesthesia with the patient or authorized representative who has indicated his/her understanding and acceptance.     Dental advisory given  Plan Discussed with: CRNA  Anesthesia Plan Comments:         Anesthesia Quick Evaluation

## 2019-08-15 ENCOUNTER — Other Ambulatory Visit: Payer: Self-pay

## 2019-08-15 ENCOUNTER — Encounter (HOSPITAL_COMMUNITY)
Admission: RE | Disposition: A | Discharge status: Home / Self Care | Payer: Self-pay | Source: Home / Self Care | Attending: Cardiology

## 2019-08-15 ENCOUNTER — Encounter (HOSPITAL_COMMUNITY): Payer: Self-pay | Admitting: Cardiology

## 2019-08-15 ENCOUNTER — Ambulatory Visit (HOSPITAL_COMMUNITY)
Admission: RE | Admit: 2019-08-15 | Discharge: 2019-08-15 | Disposition: A | Discharge status: Home / Self Care | Payer: Medicare Other | Attending: Cardiology | Admitting: Cardiology

## 2019-08-15 ENCOUNTER — Ambulatory Visit (HOSPITAL_COMMUNITY): Payer: Medicare Other | Admitting: Anesthesiology

## 2019-08-15 DIAGNOSIS — I480 Paroxysmal atrial fibrillation: Secondary | ICD-10-CM | POA: Diagnosis not present

## 2019-08-15 DIAGNOSIS — I252 Old myocardial infarction: Secondary | ICD-10-CM | POA: Diagnosis not present

## 2019-08-15 DIAGNOSIS — K219 Gastro-esophageal reflux disease without esophagitis: Secondary | ICD-10-CM | POA: Diagnosis not present

## 2019-08-15 DIAGNOSIS — E663 Overweight: Secondary | ICD-10-CM | POA: Diagnosis not present

## 2019-08-15 DIAGNOSIS — Z7901 Long term (current) use of anticoagulants: Secondary | ICD-10-CM | POA: Diagnosis not present

## 2019-08-15 DIAGNOSIS — I4819 Other persistent atrial fibrillation: Secondary | ICD-10-CM | POA: Diagnosis not present

## 2019-08-15 DIAGNOSIS — E785 Hyperlipidemia, unspecified: Secondary | ICD-10-CM | POA: Insufficient documentation

## 2019-08-15 DIAGNOSIS — N400 Enlarged prostate without lower urinary tract symptoms: Secondary | ICD-10-CM | POA: Diagnosis not present

## 2019-08-15 DIAGNOSIS — I10 Essential (primary) hypertension: Secondary | ICD-10-CM | POA: Diagnosis not present

## 2019-08-15 DIAGNOSIS — I2510 Atherosclerotic heart disease of native coronary artery without angina pectoris: Secondary | ICD-10-CM | POA: Diagnosis not present

## 2019-08-15 DIAGNOSIS — Z85828 Personal history of other malignant neoplasm of skin: Secondary | ICD-10-CM | POA: Insufficient documentation

## 2019-08-15 DIAGNOSIS — Z951 Presence of aortocoronary bypass graft: Secondary | ICD-10-CM | POA: Diagnosis not present

## 2019-08-15 DIAGNOSIS — Z885 Allergy status to narcotic agent status: Secondary | ICD-10-CM | POA: Diagnosis not present

## 2019-08-15 DIAGNOSIS — Z79899 Other long term (current) drug therapy: Secondary | ICD-10-CM | POA: Diagnosis not present

## 2019-08-15 DIAGNOSIS — G4733 Obstructive sleep apnea (adult) (pediatric): Secondary | ICD-10-CM | POA: Diagnosis not present

## 2019-08-15 DIAGNOSIS — M109 Gout, unspecified: Secondary | ICD-10-CM | POA: Insufficient documentation

## 2019-08-15 DIAGNOSIS — Z6834 Body mass index (BMI) 34.0-34.9, adult: Secondary | ICD-10-CM | POA: Diagnosis not present

## 2019-08-15 HISTORY — PX: CARDIOVERSION: SHX1299

## 2019-08-15 LAB — CBC WITH DIFFERENTIAL/PLATELET
Abs Immature Granulocytes: 0.03 10*3/uL (ref 0.00–0.07)
Basophils Absolute: 0 10*3/uL (ref 0.0–0.1)
Basophils Relative: 1 %
Eosinophils Absolute: 0.2 10*3/uL (ref 0.0–0.5)
Eosinophils Relative: 3 %
HCT: 38.3 % — ABNORMAL LOW (ref 39.0–52.0)
Hemoglobin: 11.9 g/dL — ABNORMAL LOW (ref 13.0–17.0)
Immature Granulocytes: 1 %
Lymphocytes Relative: 21 %
Lymphs Abs: 1.4 10*3/uL (ref 0.7–4.0)
MCH: 30.5 pg (ref 26.0–34.0)
MCHC: 31.1 g/dL (ref 30.0–36.0)
MCV: 98.2 fL (ref 80.0–100.0)
Monocytes Absolute: 0.5 10*3/uL (ref 0.1–1.0)
Monocytes Relative: 8 %
Neutro Abs: 4.4 10*3/uL (ref 1.7–7.7)
Neutrophils Relative %: 66 %
Platelets: 165 10*3/uL (ref 150–400)
RBC: 3.9 MIL/uL — ABNORMAL LOW (ref 4.22–5.81)
RDW: 13.3 % (ref 11.5–15.5)
WBC: 6.6 10*3/uL (ref 4.0–10.5)
nRBC: 0 % (ref 0.0–0.2)

## 2019-08-15 LAB — BASIC METABOLIC PANEL
Anion gap: 11 (ref 5–15)
BUN: 22 mg/dL (ref 8–23)
CO2: 27 mmol/L (ref 22–32)
Calcium: 8.8 mg/dL — ABNORMAL LOW (ref 8.9–10.3)
Chloride: 105 mmol/L (ref 98–111)
Creatinine, Ser: 1.52 mg/dL — ABNORMAL HIGH (ref 0.61–1.24)
GFR calc Af Amer: 50 mL/min — ABNORMAL LOW (ref >60)
GFR calc non Af Amer: 44 mL/min — ABNORMAL LOW (ref >60)
Glucose, Bld: 108 mg/dL — ABNORMAL HIGH (ref 70–99)
Potassium: 4.1 mmol/L (ref 3.5–5.1)
Sodium: 143 mmol/L (ref 135–145)

## 2019-08-15 LAB — PROTIME-INR
INR: 2.2 — ABNORMAL HIGH (ref 0.8–1.2)
Prothrombin Time: 24.3 seconds — ABNORMAL HIGH (ref 11.4–15.2)

## 2019-08-15 SURGERY — CARDIOVERSION
Anesthesia: General

## 2019-08-15 MED ORDER — LIDOCAINE 2% (20 MG/ML) 5 ML SYRINGE
INTRAMUSCULAR | Status: DC | PRN
  Administered 2019-08-15: 60 mg via INTRAVENOUS

## 2019-08-15 MED ORDER — SODIUM CHLORIDE 0.9 % IV SOLN
INTRAVENOUS | Status: AC | PRN
  Administered 2019-08-15: 500 mL via INTRAVENOUS

## 2019-08-15 MED ORDER — PROPOFOL 10 MG/ML IV BOLUS
INTRAVENOUS | Status: DC | PRN
  Administered 2019-08-15: 60 mg via INTRAVENOUS

## 2019-08-15 NOTE — CV Procedure (Signed)
    Cardioversion Note  Caleb Wolf [id number] 03/24/1941  Procedure: DC Cardioversion Indications: atrial fibrillation  Procedure Details Consent: Obtained Time Out: Verified patient identification, verified procedure, site/side was marked, verified correct patient position, special equipment/implants available, Radiology Safety Procedures followed,  medications/allergies/relevent history reviewed, required imaging and test results available.  Performed  The patient has been on adequate anticoagulation.  The patient received IV propofol 60 mg and iv lidocaine 60 mg for sedation.  Synchronous cardioversion was performed at 120 joules.  The cardioversion was successful.  Complications: No apparent complications Patient did tolerate procedure well.  Ena Dawley, MD, [facility] 08/15/2019, 8:35 AM

## 2019-08-15 NOTE — Anesthesia Postprocedure Evaluation (Signed)
Anesthesia Post Note  Patient: Caleb Wolf  Procedure(s) Performed: CARDIOVERSION (N/A )     Patient location during evaluation: Endoscopy Anesthesia Type: General Level of consciousness: awake and alert Pain management: pain level controlled Vital Signs Assessment: post-procedure vital signs reviewed and stable Respiratory status: spontaneous breathing, nonlabored ventilation and respiratory function stable Cardiovascular status: blood pressure returned to baseline and stable Postop Assessment: no apparent nausea or vomiting Anesthetic complications: no    Last Vitals:  Vitals:   08/15/19 0835 08/15/19 0849  BP: (!) 102/55 110/67  Pulse: (!) 52   Resp: 16 19  Temp:    SpO2: 97% 98%    Last Pain:  Vitals:   08/15/19 0849  TempSrc:   PainSc: 0-No pain                 Shakiara Lukic,W. EDMOND

## 2019-08-15 NOTE — Interval H&P Note (Signed)
History and Physical Interval Note:  08/15/2019 8:10 AM  Caleb Wolf  has presented today for surgery, with the diagnosis of atrial fib.  The various methods of treatment have been discussed with the patient and family. After consideration of risks, benefits and other options for treatment, the patient has consented to  Procedure(s): CARDIOVERSION (N/A) as a surgical intervention.  The patient's history has been reviewed, patient examined, no change in status, stable for surgery.  I have reviewed the patient's chart and labs.  Questions were answered to the patient's satisfaction.     Ena Dawley

## 2019-08-15 NOTE — Discharge Instructions (Signed)
Electrical Cardioversion   What can I expect after the procedure?  Your blood pressure, heart rate, breathing rate, and blood oxygen level will be monitored until you leave the hospital or clinic.  Your heart rhythm will be watched to make sure it does not change.  You may have some redness on the skin where the shocks were given. Follow these instructions at home:  Do not drive for 24 hours if you were given a sedative during your procedure.  Take over-the-counter and prescription medicines only as told by your health care provider.  Ask your health care provider how to check your pulse. Check it often.  Rest for 48 hours after the procedure or as told by your health care provider.  Avoid or limit your caffeine use as told by your health care provider.  Keep all follow-up visits as told by your health care provider. This is important. Contact a health care provider if:  You feel like your heart is beating too quickly or your pulse is not regular.  You have a serious muscle cramp that does not go away. Get help right away if:  You have discomfort in your chest.  You are dizzy or you feel faint.  You have trouble breathing or you are short of breath.  Your speech is slurred.  You have trouble moving an arm or leg on one side of your body.  Your fingers or toes turn cold or blue. Summary  Electrical cardioversion is the delivery of a jolt of electricity to restore a normal rhythm to the heart.  This procedure may be done right away in an emergency or may be a scheduled procedure if the condition is not an emergency.  Generally, this is a safe procedure.  After the procedure, check your pulse often as told by your health care provider. This information is not intended to replace advice given to you by your health care provider. Make sure you discuss any questions you have with your health care provider. Document Revised: 12/20/2018 Document Reviewed:  12/20/2018 Elsevier Patient Education  2020 [employer]  

## 2019-08-15 NOTE — Transfer of Care (Signed)
Immediate Anesthesia Transfer of Care Note  Patient: Caleb Wolf  Procedure(s) Performed: CARDIOVERSION (N/A )  Patient Location: Endoscopy Unit  Anesthesia Type:General  Level of Consciousness: drowsy and patient cooperative  Airway & Oxygen Therapy: Patient Spontanous Breathing  Post-op Assessment: Report given to RN, Post -op Vital signs reviewed and stable and Patient moving all extremities X 4  Post vital signs: Reviewed and stable  Last Vitals:  Vitals Value Taken Time  BP    Temp    Pulse    Resp    SpO2      Last Pain:  Vitals:   08/15/19 0702  TempSrc: Oral  PainSc: 0-No pain         Complications: No apparent anesthesia complications

## 2019-08-18 DIAGNOSIS — C44319 Basal cell carcinoma of skin of other parts of face: Secondary | ICD-10-CM | POA: Diagnosis not present

## 2019-08-22 ENCOUNTER — Other Ambulatory Visit: Payer: Self-pay

## 2019-08-22 ENCOUNTER — Ambulatory Visit (HOSPITAL_COMMUNITY)
Admission: RE | Admit: 2019-08-22 | Discharge: 2019-08-22 | Disposition: A | Discharge status: Home / Self Care | Payer: Medicare Other | Source: Ambulatory Visit | Attending: Nurse Practitioner | Admitting: Nurse Practitioner

## 2019-08-22 ENCOUNTER — Encounter (HOSPITAL_COMMUNITY): Payer: Self-pay | Admitting: Nurse Practitioner

## 2019-08-22 VITALS — BP 144/66 | HR 64 | Ht 70.0 in | Wt 249.6 lb

## 2019-08-22 DIAGNOSIS — Z96652 Presence of left artificial knee joint: Secondary | ICD-10-CM | POA: Insufficient documentation

## 2019-08-22 DIAGNOSIS — N529 Male erectile dysfunction, unspecified: Secondary | ICD-10-CM | POA: Diagnosis not present

## 2019-08-22 DIAGNOSIS — Z87891 Personal history of nicotine dependence: Secondary | ICD-10-CM | POA: Insufficient documentation

## 2019-08-22 DIAGNOSIS — Z96641 Presence of right artificial hip joint: Secondary | ICD-10-CM | POA: Insufficient documentation

## 2019-08-22 DIAGNOSIS — N400 Enlarged prostate without lower urinary tract symptoms: Secondary | ICD-10-CM | POA: Insufficient documentation

## 2019-08-22 DIAGNOSIS — I480 Paroxysmal atrial fibrillation: Secondary | ICD-10-CM | POA: Diagnosis not present

## 2019-08-22 DIAGNOSIS — K219 Gastro-esophageal reflux disease without esophagitis: Secondary | ICD-10-CM | POA: Insufficient documentation

## 2019-08-22 DIAGNOSIS — I2510 Atherosclerotic heart disease of native coronary artery without angina pectoris: Secondary | ICD-10-CM | POA: Insufficient documentation

## 2019-08-22 DIAGNOSIS — Z79899 Other long term (current) drug therapy: Secondary | ICD-10-CM | POA: Diagnosis not present

## 2019-08-22 DIAGNOSIS — E785 Hyperlipidemia, unspecified: Secondary | ICD-10-CM | POA: Diagnosis not present

## 2019-08-22 DIAGNOSIS — Z7901 Long term (current) use of anticoagulants: Secondary | ICD-10-CM | POA: Insufficient documentation

## 2019-08-22 DIAGNOSIS — M109 Gout, unspecified: Secondary | ICD-10-CM | POA: Diagnosis not present

## 2019-08-22 DIAGNOSIS — Z85828 Personal history of other malignant neoplasm of skin: Secondary | ICD-10-CM | POA: Insufficient documentation

## 2019-08-22 DIAGNOSIS — G4733 Obstructive sleep apnea (adult) (pediatric): Secondary | ICD-10-CM | POA: Insufficient documentation

## 2019-08-22 DIAGNOSIS — Z87442 Personal history of urinary calculi: Secondary | ICD-10-CM | POA: Diagnosis not present

## 2019-08-22 DIAGNOSIS — I10 Essential (primary) hypertension: Secondary | ICD-10-CM | POA: Diagnosis not present

## 2019-08-22 DIAGNOSIS — Z951 Presence of aortocoronary bypass graft: Secondary | ICD-10-CM | POA: Insufficient documentation

## 2019-08-22 DIAGNOSIS — Z86010 Personal history of colonic polyps: Secondary | ICD-10-CM | POA: Insufficient documentation

## 2019-08-22 DIAGNOSIS — Z885 Allergy status to narcotic agent status: Secondary | ICD-10-CM | POA: Insufficient documentation

## 2019-08-22 DIAGNOSIS — M1990 Unspecified osteoarthritis, unspecified site: Secondary | ICD-10-CM | POA: Diagnosis not present

## 2019-08-22 DIAGNOSIS — I4819 Other persistent atrial fibrillation: Secondary | ICD-10-CM

## 2019-08-22 DIAGNOSIS — Z8249 Family history of ischemic heart disease and other diseases of the circulatory system: Secondary | ICD-10-CM | POA: Diagnosis not present

## 2019-08-22 DIAGNOSIS — D6869 Other thrombophilia: Secondary | ICD-10-CM

## 2019-08-22 DIAGNOSIS — I4891 Unspecified atrial fibrillation: Secondary | ICD-10-CM | POA: Diagnosis present

## 2019-08-22 DIAGNOSIS — N401 Enlarged prostate with lower urinary tract symptoms: Secondary | ICD-10-CM | POA: Diagnosis not present

## 2019-08-22 DIAGNOSIS — N200 Calculus of kidney: Secondary | ICD-10-CM | POA: Diagnosis not present

## 2019-08-22 DIAGNOSIS — N3941 Urge incontinence: Secondary | ICD-10-CM | POA: Diagnosis not present

## 2019-08-22 DIAGNOSIS — N312 Flaccid neuropathic bladder, not elsewhere classified: Secondary | ICD-10-CM | POA: Diagnosis not present

## 2019-08-22 NOTE — Progress Notes (Signed)
Primary Care Physician: Asencion Noble, MD Referring Physician: [name] is a 79 y.o. male with a h/o afib s/p ablation in 2019, recently had a visit with [name] and found to be in afib. He was set up for cardioversion, which was successful  and is here now for f/u. He remains in SR. He does have some baseline LLE and exertional dyspnea, unchanged from his baseline. No trigger identified  for the episode.   Today, he denies symptoms of palpitations, chest pain, shortness of breath, orthopnea, PND, lower extremity edema, dizziness, presyncope, syncope, or neurologic sequela. The patient is tolerating medications without difficulties and is otherwise without complaint today.   Past Medical History:  Diagnosis Date  . Arthritis   . Basal cell carcinoma   . Carotid stenosis   . Coronary artery disease    Multvessel s/p CABG 2015  . Enlarged prostate   . Essential hypertension   . GERD (gastroesophageal reflux disease)   . Gout   . History of colon polyps   . History of kidney stones   . Hyperlipidemia   . Lumbar disc disease   . Persistent atrial fibrillation ([place])   . Sleep apnea    CPAP   Past Surgical History:  Procedure Laterality Date  . ABLATION OF DYSRHYTHMIC FOCUS  07/28/2017  . ATRIAL FIBRILLATION ABLATION N/A 07/28/2017   Procedure: ATRIAL FIBRILLATION ABLATION;  Surgeon: Thompson Grayer, MD;  Location: [facility] LAB;  Service: Cardiovascular;  Laterality: N/A;  . BACK SURGERY    . CARDIAC CATHETERIZATION  05/22/2014   Procedure: IABP INSERTION;  Surgeon: Leonie Man, MD;  Location: [facility] CATH LAB;  Service: Cardiovascular;;  . CARDIOVERSION N/A 04/29/2017   Procedure: CARDIOVERSION;  Surgeon: Satira Sark, MD;  [facility];  Service: Cardiovascular;  Laterality: N/A;  . CARDIOVERSION N/A 08/13/2017   Procedure: CARDIOVERSION;  Surgeon: Pixie Casino, MD;  Location: [facility] ENDOSCOPY;  Service: Cardiovascular;  Laterality: N/A;  .  CARDIOVERSION N/A 08/15/2019   Procedure: CARDIOVERSION;  Surgeon: Dorothy Spark, MD;  Location: [facility] ENDOSCOPY;  Service: Cardiovascular;  Laterality: N/A;  . Cataract surgery Right   . COLONOSCOPY    . COLONOSCOPY N/A 12/30/2017   Procedure: COLONOSCOPY;  Surgeon: Rogene Houston, MD;  [facility];  Service: Endoscopy;  Laterality: N/A;  . CORONARY ARTERY BYPASS GRAFT N/A 05/22/2014   Procedure: CORONARY ARTERY BYPASS GRAFTING (CABG) times three using left internal mammary and right saphenous vein.;  Surgeon: Melrose Nakayama, MD;  Location: [place];  Service: Open Heart Surgery;  Laterality: N/A;  . ENDARTERECTOMY Left 02/17/2014   Procedure: ENDARTERECTOMY CAROTID WITH PATCH ANGIOPLASTY;  Surgeon: Mal Misty, MD;  Location: [place];  Service: Vascular;  Laterality: Left;  . ESOPHAGEAL DILATION N/A 08/22/2015   Procedure: ESOPHAGEAL DILATION;  Surgeon: Rogene Houston, MD;  [facility];  Service: Endoscopy;  Laterality: N/A;  . ESOPHAGOGASTRODUODENOSCOPY N/A 08/22/2015   Procedure: ESOPHAGOGASTRODUODENOSCOPY (EGD);  Surgeon: Rogene Houston, MD;  [facility];  Service: Endoscopy;  Laterality: N/A;  12:45 - moved to 1:55 - Ann notified pt  . ESOPHAGOGASTRODUODENOSCOPY N/A 12/30/2017   Procedure: ESOPHAGOGASTRODUODENOSCOPY (EGD);  Surgeon: Rogene Houston, MD;  [facility];  Service: Endoscopy;  Laterality: N/A;  200  . EYE SURGERY     cataract extraction (right) with repair macular tear , with IOL     right  . FRACTURE SURGERY  bilateral wrist fractures- one ORIF  . JOINT REPLACEMENT  2011   left knee  . LEFT HEART CATHETERIZATION WITH CORONARY ANGIOGRAM N/A 05/22/2014   Procedure: LEFT HEART CATHETERIZATION WITH CORONARY ANGIOGRAM;  Surgeon: Leonie Man, MD;  Location: [facility] CATH LAB;  Service: Cardiovascular;  Laterality: N/A;  . LITHOTRIPSY    . PARS PLANA VITRECTOMY W/ REPAIR OF MACULAR HOLE    . RHINOPLASTY    . TEE WITHOUT  CARDIOVERSION N/A 04/29/2017   Procedure: TRANSESOPHAGEAL ECHOCARDIOGRAM (TEE) WITH PROPOFOL;  Surgeon: Satira Sark, MD;  [facility];  Service: Cardiovascular;  Laterality: N/A;  . TONSILLECTOMY    . TOTAL HIP ARTHROPLASTY  12/08/2011   Procedure: TOTAL HIP ARTHROPLASTY;  Surgeon: Gearlean Alf, MD;  Location: [facility] ORS;  Service: Orthopedics;  Laterality: Right;  . UPPER GI ENDOSCOPY  12/18/2015   Procedure: UPPER GI ENDOSCOPY;  Surgeon: Ralene Ok, MD;  Location: [facility] ORS;  Service: General;;  . WRIST SURGERY Right 73yrs ago  . WRIST SURGERY Left     Current Outpatient Medications  Medication Sig Dispense Refill  . acetaminophen (TYLENOL) 500 MG tablet Take 1,000 mg by mouth every 6 (six) hours as needed for moderate pain.    Marland Kitchen atorvastatin (LIPITOR) 80 MG tablet TAKE 1 TABLET BY MOUTH EVERY EVENING (Patient taking differently: Take 80 mg by mouth every evening. ) 90 tablet 3  . colchicine 0.6 MG tablet Take 0.6 mg by mouth daily as needed (for gout flare up).     . Cyanocobalamin (B-12) 1000 MCG SUBL Place 1,000 mcg under the tongue daily.     Marland Kitchen diltiazem (CARDIZEM) 30 MG tablet Take 1 tablet (30 mg total) by mouth 4 (four) times daily as needed (for palpitations/elevated heart rates). 30 tablet 0  . doxazosin (CARDURA) 4 MG tablet Take 4 mg by mouth daily.  4  . finasteride (PROSCAR) 5 MG tablet Take 5 mg by mouth every evening.     . furosemide (LASIX) 40 MG tablet TAKE 1 TABLET BY MOUTH TWICE A DAY (Patient taking differently: Take 40 mg by mouth See admin instructions. Take 40 mg once daily, may take a second 40 mg dose as needed for swelling) 180 tablet 1  . Iron-Vitamin C (VITRON-C) 65-125 MG TABS Take 1 tablet by mouth daily.    Marland Kitchen lisinopril (PRINIVIL,ZESTRIL) 40 MG tablet Take 40 mg by mouth daily.    . nitroGLYCERIN (NITROSTAT) 0.4 MG SL tablet Take as directed (Patient taking differently: Place 0.4 mg under the tongue every 5 (five) minutes as needed for chest  pain. ) 25 tablet 3  . pantoprazole (PROTONIX) 40 MG tablet Take 1 tablet (40 mg total) by mouth daily before breakfast. (Patient taking differently: Take 40 mg by mouth every evening. ) 90 tablet 3  . rivaroxaban (XARELTO) 20 MG TABS tablet Take 1 tablet (20 mg total) by mouth daily. 30 tablet 11   No current facility-administered medications for this encounter.    Allergies  Allergen Reactions  . Codeine Sulfate Nausea Only    Social History   Socioeconomic History  . Marital status: Married    Spouse name: Not on file  . Number of children: Not on file  . Years of education: Not on file  . Highest education level: Not on file  Occupational History  . Occupation: retired    Comment: [place] [employer]  Tobacco Use  . Smoking status: Former Smoker    Packs/day: 1.50    Years:  25.00    Pack years: 37.50    Types: Cigarettes    Start date: 08/08/1960    Quit date: 06/02/1982    Years since quitting: 37.2  . Smokeless tobacco: Never Used  . Tobacco comment: quit smoking 30+yrs ago  Substance and Sexual Activity  . Alcohol use: No    Alcohol/week: 0.0 standard drinks    Comment: occasionally   . Drug use: No  . Sexual activity: Yes  Other Topics Concern  . Not on file  Social History Narrative  . Not on file   Social Determinants of Health   Financial Resource Strain:   . Difficulty of Paying Living Expenses:   Food Insecurity:   . Worried About Charity fundraiser in the Last Year:   . Arboriculturist in the Last Year:   Transportation Needs:   . Film/video editor (Medical):   Marland Kitchen Lack of Transportation (Non-Medical):   Physical Activity:   . Days of Exercise per Week:   . Minutes of Exercise per Session:   Stress:   . Feeling of Stress :   Social Connections:   . Frequency of Communication with Friends and Family:   . Frequency of Social Gatherings with Friends and Family:   . Attends Religious Services:   . Active Member of Clubs or Organizations:    . Attends Archivist Meetings:   Marland Kitchen Marital Status:   Intimate Partner Violence:   . Fear of Current or Ex-Partner:   . Emotionally Abused:   Marland Kitchen Physically Abused:   . Sexually Abused:     Family History  Problem Relation Age of Onset  . Allergies Mother   . Heart disease Mother   . Hypertension Mother   . Heart disease Father        MI    ROS- All systems are reviewed and negative except as per the HPI above  Physical Exam: Vitals:   08/22/19 1122  BP: (!) 144/66  Pulse: 64  Weight: 113.2 kg  Height: 5\' 10"  (1.778 m)   Wt Readings from Last 3 Encounters:  08/22/19 113.2 kg  08/15/19 108 kg  08/08/19 108.9 kg    Labs: Lab Results  Component Value Date   NA 143 08/15/2019   K 4.1 08/15/2019   CL 105 08/15/2019   CO2 27 08/15/2019   GLUCOSE 108 (H) 08/15/2019   BUN 22 08/15/2019   CREATININE 1.52 (H) 08/15/2019   CALCIUM 8.8 (L) 08/15/2019   PHOS 3.3 04/29/2017   MG 1.9 04/30/2017   Lab Results  Component Value Date   INR 2.2 (H) 08/15/2019   Lab Results  Component Value Date   CHOL 134 03/22/2015   HDL 41 03/22/2015   LDLCALC 75 03/22/2015   TRIG 92 03/22/2015     GEN- The patient is well appearing, alert and oriented x 3 today.   Head- normocephalic, atraumatic Eyes-  Sclera clear, conjunctiva pink Ears- hearing intact Oropharynx- clear Neck- supple, no JVP Lymph- no cervical lymphadenopathy Lungs- Clear to ausculation bilaterally, normal work of breathing Heart- Regular rate and rhythm, no murmurs, rubs or gallops, PMI not laterally displaced GI- soft, NT, ND, + BS Extremities- no clubbing, cyanosis, or edema MS- no significant deformity or atrophy Skin- no rash or lesion Psych- euthymic mood, full affect Neuro- strength and sensation are intact  EKG-sinus rhythm at 64 bpm, with PAC's,  PR int 168 ms, qrs int 88 ms, qtc 437 ms  Assessment and  Plan: 1. Paroxysmal  afib Successful cardioversion 08/08/19 and staying in SR  He has  30 mg cardizem if needed  No daily rate control on board   2. CHA2DS2VASc score of at least 5 Continue  xarelto 20 mg daily   3. HTN Stable   F/u with [name] per his last note in 3 months   Geroge Baseman. Jaxon Mynhier, [facility] [address] [place], [place] [address] [phone]

## 2019-08-24 ENCOUNTER — Encounter: Payer: Self-pay | Admitting: Internal Medicine

## 2019-08-24 ENCOUNTER — Other Ambulatory Visit: Payer: Self-pay

## 2019-08-24 ENCOUNTER — Ambulatory Visit (INDEPENDENT_AMBULATORY_CARE_PROVIDER_SITE_OTHER): Payer: Medicare Other | Admitting: Internal Medicine

## 2019-08-24 DIAGNOSIS — I4819 Other persistent atrial fibrillation: Secondary | ICD-10-CM | POA: Diagnosis not present

## 2019-08-24 DIAGNOSIS — G4733 Obstructive sleep apnea (adult) (pediatric): Secondary | ICD-10-CM

## 2019-08-24 NOTE — Progress Notes (Signed)
Subjective:    Patient ID: Caleb Wolf, male    DOB: 1940-12-04, 79 y.o.   MRN: [id number]  HPI   Male former smoker followed for OSA, complicated by HBP, CAD/MI/CABG, carotid stenosis, osteoarthritis hip, GERD NPSG 07/2012  AHI 15/ hr, CPAP to 10 ---------------------------------------------------.  07/20/2018- 79 year old male former smoker followed for OSA, complicated by HBP, CAD/MI/CABG/AFib, carotid stenosis, osteoarthritis hip, GERD CPAP auto 5-15/[name] office is now closed) -----Follows for: OSA Download 93% compliance AHI 1.7/hour Body weight today 247 pounds This machine was new in 2019.  Especially in winter months the humidifier runs dry.  We discussed adjusting it and possibly adding a room humidifier if needed while indoor heat is on.  Otherwise he is doing quite well with no acute events.  08/24/19- 79 year old male former smoker followed for OSA, complicated by HBP, CAD/MI/CABG/AFib, carotid stenosis, osteoarthritis hip, GERD CPAP auto 5-15/[facility] Download compliance 100%, AHI 3.3/ hr Sleeping well with CPAP. Recent repeat cardioversion- so far so good.  Notes some DOE, within his expected range, no cough, wheeze or change.   ROS-see HPI + = positive Constitutional:    weight loss, night sweats, fevers, chills, fatigue, lassitude. HEENT:    headaches, difficulty swallowing, tooth/dental problems, sore throat,       sneezing, itching, ear ache, nasal congestion, post nasal drip, snoring CV:    chest pain, orthopnea, PND, swelling in lower extremities, anasarca,                                               dizziness, palpitations Resp:   +shortness of breath with exertion or at rest.                productive cough,   non-productive cough, coughing up of blood.              change in color of mucus.  wheezing.   Skin:    rash or lesions. GI:  No-   heartburn, indigestion, abdominal pain, nausea, vomiting, diarrhea,                 change in bowel habits,  loss of appetite GU: dysuria, change in color of urine, no urgency or frequency.   flank pain. MS:   joint pain, stiffness, decreased range of motion, back pain. Neuro-     nothing unusual Psych:  change in mood or affect.  depression or anxiety.   memory loss.    Objective:  OBJ- Physical Exam General- Alert, Oriented, Affect-appropriate, Distress- none acute, + overweight Skin- rash-none, lesions- none, excoriation- none Lymphadenopathy- none Head- atraumatic            Eyes- Gross vision intact, PERRLA, conjunctivae and secretions clear            Ears- Hearing, canals-normal            Nose- Clear, no-Septal dev, mucus, polyps, erosion, perforation             Throat- Mallampati III-IV, mucosa clear , drainage- none, tonsils- atrophic Neck- flexible , trachea midline, no stridor , thyroid nl, carotid no bruit Chest - symmetrical excursion , unlabored           Heart/CV- RR+today , no murmur , no gallop  , no rub, nl s1 s2                           -  JVD- none , edema- none, stasis changes- none, varices- none           Lung- clear to P&A, wheeze- none, cough- none , dullness-none, rub- none           Chest wall-  Abd-  Br/ Gen/ Rectal- Not done, not indicated Extrem- cyanosis- none, clubbing, none, atrophy- none, strength- nl Neuro- grossly intact to observation    Assessment & Plan:    Subjective:    Patient ID: Caleb Wolf, male    DOB: 07-18-1940, 79 y.o.   MRN: [id number]  HPI   Male former smoker followed for OSA, complicated by HBP, CAD/MI/CABG, carotid stenosis, osteoarthritis hip, GERD NPSG 07/2012  AHI 15/ hr, CPAP to 10 ---------------------------------------------------.   07/20/2018- 79 year old male former smoker followed for OSA, complicated by HBP, CAD/MI/CABG/AFib, carotid stenosis, osteoarthritis hip, GERD CPAP auto 5-15/[name] office is now closed) -----Follows for: OSA Download 93% compliance AHI 1.7/hour Body weight today 247 pounds This  machine was new in 2019.  Especially in winter months the humidifier runs dry.  We discussed adjusting it and possibly adding a room humidifier if needed while indoor heat is on.  Otherwise he is doing quite well with no acute events.  08/24/19-  79 year old male former smoker followed for OSA, complicated by HBP, CAD/MI/CABG/AFib, carotid stenosis, osteoarthritis hip, GERD CPAP auto 5-20/ [facility] Download compliance 100%, AHI 3.3/ hr Body weight today 244 lbs -----f/u OSA. DC cardioversion on 3/15   ROS-see HPI + = positive Constitutional:    weight loss, night sweats, fevers, chills, fatigue, lassitude. HEENT:    headaches, difficulty swallowing, tooth/dental problems, sore throat,       sneezing, itching, ear ache, nasal congestion, post nasal drip, snoring CV:    chest pain, orthopnea, PND, swelling in lower extremities, anasarca,                                               dizziness, palpitations Resp:   shortness of breath with exertion or at rest.                productive cough,   non-productive cough, coughing up of blood.              change in color of mucus.  wheezing.   Skin:    rash or lesions. GI:  No-   heartburn, indigestion, abdominal pain, nausea, vomiting, diarrhea,                 change in bowel habits, loss of appetite GU: dysuria, change in color of urine, no urgency or frequency.   flank pain. MS:   joint pain, stiffness, decreased range of motion, back pain. Neuro-     nothing unusual Psych:  change in mood or affect.  depression or anxiety.   memory loss.    Objective:  OBJ- Physical Exam General- Alert, Oriented, Affect-appropriate, Distress- none acute, + overweight Skin- rash-none, lesions- none, excoriation- none Lymphadenopathy- none Head- atraumatic            Eyes- Gross vision intact, PERRLA, conjunctivae and secretions clear            Ears- Hearing, canals-normal            Nose- Clear, no-Septal dev, mucus, polyps, erosion, perforation  Throat- Mallampati III-IV, mucosa clear , drainage- none, tonsils- atrophic Neck- flexible , trachea midline, no stridor , thyroid nl, carotid no bruit Chest - symmetrical excursion , unlabored           Heart/CV- RR+today , no murmur , no gallop  , no rub, nl s1 s2                           - JVD- none , edema- none, stasis changes- none, varices- none           Lung- clear to P&A, wheeze- none, cough- none , dullness-none, rub- none           Chest wall-  Abd-  Br/ Gen/ Rectal- Not done, not indicated Extrem- cyanosis- none, clubbing, none, atrophy- none, strength- nl Neuro- grossly intact to observation    Assessment & Plan:

## 2019-08-24 NOTE — Assessment & Plan Note (Signed)
Cardiology following. Hope recent cardioversion will last.

## 2019-08-24 NOTE — Assessment & Plan Note (Signed)
Benefits with good compliance and control Plan- continue CPAP auto 5-20 

## 2019-08-24 NOTE — Patient Instructions (Signed)
Order- DME [facility]- continue CPAP auto 5-20, mask of choice, humidifier, supplies,  AirView/ card  Please call if we can help   

## 2019-09-01 DIAGNOSIS — M65342 Trigger finger, left ring finger: Secondary | ICD-10-CM | POA: Diagnosis not present

## 2019-09-30 ENCOUNTER — Other Ambulatory Visit: Payer: Self-pay | Admitting: Cardiovascular Disease

## 2019-10-03 DIAGNOSIS — H04123 Dry eye syndrome of bilateral lacrimal glands: Secondary | ICD-10-CM | POA: Diagnosis not present

## 2019-10-03 DIAGNOSIS — H43812 Vitreous degeneration, left eye: Secondary | ICD-10-CM | POA: Diagnosis not present

## 2019-10-03 DIAGNOSIS — H35372 Puckering of macula, left eye: Secondary | ICD-10-CM | POA: Diagnosis not present

## 2019-10-03 DIAGNOSIS — Z961 Presence of intraocular lens: Secondary | ICD-10-CM | POA: Diagnosis not present

## 2019-10-28 DIAGNOSIS — N183 Chronic kidney disease, stage 3 unspecified: Secondary | ICD-10-CM | POA: Diagnosis not present

## 2019-10-28 DIAGNOSIS — I5022 Chronic systolic (congestive) heart failure: Secondary | ICD-10-CM | POA: Diagnosis not present

## 2019-10-28 DIAGNOSIS — I480 Paroxysmal atrial fibrillation: Secondary | ICD-10-CM | POA: Diagnosis not present

## 2019-10-28 DIAGNOSIS — Z79899 Other long term (current) drug therapy: Secondary | ICD-10-CM | POA: Diagnosis not present

## 2019-10-28 DIAGNOSIS — I2510 Atherosclerotic heart disease of native coronary artery without angina pectoris: Secondary | ICD-10-CM | POA: Diagnosis not present

## 2019-11-04 DIAGNOSIS — Z6835 Body mass index (BMI) 35.0-35.9, adult: Secondary | ICD-10-CM | POA: Diagnosis not present

## 2019-11-04 DIAGNOSIS — D649 Anemia, unspecified: Secondary | ICD-10-CM | POA: Diagnosis not present

## 2019-11-04 DIAGNOSIS — I480 Paroxysmal atrial fibrillation: Secondary | ICD-10-CM | POA: Diagnosis not present

## 2019-11-04 DIAGNOSIS — M545 Low back pain: Secondary | ICD-10-CM | POA: Diagnosis not present

## 2019-11-04 DIAGNOSIS — N1831 Chronic kidney disease, stage 3a: Secondary | ICD-10-CM | POA: Diagnosis not present

## 2019-11-15 DIAGNOSIS — M545 Low back pain, unspecified: Secondary | ICD-10-CM | POA: Insufficient documentation

## 2019-11-15 DIAGNOSIS — M961 Postlaminectomy syndrome, not elsewhere classified: Secondary | ICD-10-CM | POA: Diagnosis not present

## 2019-11-15 DIAGNOSIS — M48061 Spinal stenosis, lumbar region without neurogenic claudication: Secondary | ICD-10-CM | POA: Insufficient documentation

## 2019-11-15 DIAGNOSIS — M48062 Spinal stenosis, lumbar region with neurogenic claudication: Secondary | ICD-10-CM | POA: Diagnosis not present

## 2019-12-07 ENCOUNTER — Ambulatory Visit (INDEPENDENT_AMBULATORY_CARE_PROVIDER_SITE_OTHER): Payer: Medicare Other | Admitting: Internal Medicine

## 2019-12-07 ENCOUNTER — Other Ambulatory Visit: Payer: Self-pay

## 2019-12-07 ENCOUNTER — Encounter: Payer: Self-pay | Admitting: Internal Medicine

## 2019-12-07 VITALS — BP 136/60 | HR 64 | Ht 71.0 in | Wt 232.0 lb

## 2019-12-07 DIAGNOSIS — G4733 Obstructive sleep apnea (adult) (pediatric): Secondary | ICD-10-CM | POA: Diagnosis not present

## 2019-12-07 DIAGNOSIS — I10 Essential (primary) hypertension: Secondary | ICD-10-CM | POA: Diagnosis not present

## 2019-12-07 DIAGNOSIS — I4819 Other persistent atrial fibrillation: Secondary | ICD-10-CM

## 2019-12-07 DIAGNOSIS — E785 Hyperlipidemia, unspecified: Secondary | ICD-10-CM | POA: Insufficient documentation

## 2019-12-07 NOTE — Patient Instructions (Addendum)
Medication Instructions:  Your physician recommends that you continue on your current medications as directed. Please refer to the Current Medication list given to you today.  *If you need a refill on your cardiac medications before your next appointment, please call your pharmacy*  Lab Work: None ordered.  If you have labs (blood work) drawn today and your tests are completely normal, you will receive your results only by: Marland Kitchen MyChart Message (if you have MyChart) OR . A paper copy in the mail If you have any lab test that is abnormal or we need to change your treatment, we will call you to review the results.  Testing/Procedures: Your physician has requested that you have an echocardiogram. Echocardiography is a painless test that uses sound waves to create images of your heart. It provides your doctor with information about the size and shape of your heart and how well your heart's chambers and valves are working. This procedure takes approximately one hour. There are no restrictions for this procedure.  Please schedule ECHO at [place]: At [facility], you and your health needs are our priority.  As part of our continuing mission to provide you with exceptional heart care, we have created designated Provider Care Teams.  These Care Teams include your primary Cardiologist (physician) and Advanced Practice Providers (APPs -  Physician Assistants and Nurse Practitioners) who all work together to provide you with the care you need, when you need it.  We recommend signing up for the patient portal called "MyChart".  Sign up information is provided on this After Visit Summary.  MyChart is used to connect with patients for Virtual Visits (Telemedicine).  Patients are able to view lab/test results, encounter notes, upcoming appointments, etc.  Non-urgent messages can be sent to your provider as well.   To learn more about what you can do with MyChart, go to [email or web address].     Your next appointment:   Your physician wants you to follow-up in: 6 months with [name].

## 2019-12-07 NOTE — Progress Notes (Signed)
PCP: Caleb Noble, MD   Primary EP: Caleb Wolf is a 79 y.o. male who presents today for routine electrophysiology followup.  Since last being seen in our clinic, the patient reports doing very well.  Today, he denies symptoms of palpitations, chest pain, shortness of breath,  lower extremity edema, dizziness, presyncope, or syncope.  The patient is otherwise without complaint today.   Past Medical History:  Diagnosis Date  . Arthritis   . Basal cell carcinoma   . Carotid stenosis   . Coronary artery disease    Multvessel s/p CABG 2015  . Enlarged prostate   . Essential hypertension   . GERD (gastroesophageal reflux disease)   . Gout   . History of colon polyps   . History of kidney stones   . Hyperlipidemia   . Lumbar disc disease   . Persistent atrial fibrillation ([place])   . Sleep apnea    CPAP   Past Surgical History:  Procedure Laterality Date  . ABLATION OF DYSRHYTHMIC FOCUS  07/28/2017  . ATRIAL FIBRILLATION ABLATION N/A 07/28/2017   Procedure: ATRIAL FIBRILLATION ABLATION;  Surgeon: Thompson Grayer, MD;  Location: [place] [facility];  Service: Cardiovascular;  Laterality: N/A;  . BACK SURGERY    . CARDIAC CATHETERIZATION  05/22/2014   Procedure: IABP INSERTION;  Surgeon: Leonie Man, MD;  Location: [facility] CATH LAB;  Service: Cardiovascular;;  . CARDIOVERSION N/A 04/29/2017   Procedure: CARDIOVERSION;  Surgeon: Satira Sark, MD;  [facility];  Service: Cardiovascular;  Laterality: N/A;  . CARDIOVERSION N/A 08/13/2017   Procedure: CARDIOVERSION;  Surgeon: Pixie Casino, MD;  Location: [facility] ENDOSCOPY;  Service: Cardiovascular;  Laterality: N/A;  . CARDIOVERSION N/A 08/15/2019   Procedure: CARDIOVERSION;  Surgeon: Dorothy Spark, MD;  Location: [facility] ENDOSCOPY;  Service: Cardiovascular;  Laterality: N/A;  . Cataract surgery Right   . COLONOSCOPY    . COLONOSCOPY N/A 12/30/2017   Procedure: COLONOSCOPY;  Surgeon: Rogene Houston, MD;  [facility];  Service: Endoscopy;  Laterality: N/A;  . CORONARY ARTERY BYPASS GRAFT N/A 05/22/2014   Procedure: CORONARY ARTERY BYPASS GRAFTING (CABG) times three using left internal mammary and right saphenous vein.;  Surgeon: Melrose Nakayama, MD;  Location: [place];  Service: Open Heart Surgery;  Laterality: N/A;  . ENDARTERECTOMY Left 02/17/2014   Procedure: ENDARTERECTOMY CAROTID WITH PATCH ANGIOPLASTY;  Surgeon: Mal Misty, MD;  Location: [place];  Service: Vascular;  Laterality: Left;  . ESOPHAGEAL DILATION N/A 08/22/2015   Procedure: ESOPHAGEAL DILATION;  Surgeon: Rogene Houston, MD;  [facility];  Service: Endoscopy;  Laterality: N/A;  . ESOPHAGOGASTRODUODENOSCOPY N/A 08/22/2015   Procedure: ESOPHAGOGASTRODUODENOSCOPY (EGD);  Surgeon: Rogene Houston, MD;  [facility];  Service: Endoscopy;  Laterality: N/A;  12:45 - moved to 1:55 - Ann notified pt  . ESOPHAGOGASTRODUODENOSCOPY N/A 12/30/2017   Procedure: ESOPHAGOGASTRODUODENOSCOPY (EGD);  Surgeon: Rogene Houston, MD;  [facility];  Service: Endoscopy;  Laterality: N/A;  200  . EYE SURGERY     cataract extraction (right) with repair macular tear , with IOL     right  . FRACTURE SURGERY     bilateral wrist fractures- one ORIF  . JOINT REPLACEMENT  2011   left knee  . LEFT HEART CATHETERIZATION WITH CORONARY ANGIOGRAM N/A 05/22/2014   Procedure: LEFT HEART CATHETERIZATION WITH CORONARY ANGIOGRAM;  Surgeon: Leonie Man, MD;  Location: [facility] CATH LAB;  Service: Cardiovascular;  Laterality: N/A;  . LITHOTRIPSY    . PARS PLANA VITRECTOMY W/ REPAIR OF MACULAR HOLE    . RHINOPLASTY    . TEE WITHOUT CARDIOVERSION N/A 04/29/2017   Procedure: TRANSESOPHAGEAL ECHOCARDIOGRAM (TEE) WITH PROPOFOL;  Surgeon: Satira Sark, MD;  [facility];  Service: Cardiovascular;  Laterality: N/A;  . TONSILLECTOMY    . TOTAL HIP ARTHROPLASTY  12/08/2011   Procedure: TOTAL HIP ARTHROPLASTY;  Surgeon: Gearlean Alf, MD;  [facility];  Service: Orthopedics;  Laterality: Right;  . UPPER GI ENDOSCOPY  12/18/2015   Procedure: UPPER GI ENDOSCOPY;  Surgeon: Ralene Ok, MD;  Location: [facility];  Service: General;;  . WRIST SURGERY Right 72yrs ago  . WRIST SURGERY Left     ROS- all systems are reviewed and negatives except as per HPI above  Current Outpatient Medications  Medication Sig Dispense Refill  . acetaminophen (TYLENOL) 500 MG tablet Take 1,000 mg by mouth every 6 (six) hours as needed for moderate pain.    Marland Kitchen atorvastatin (LIPITOR) 80 MG tablet TAKE 1 TABLET BY MOUTH EVERY EVENING 90 tablet 3  . colchicine 0.6 MG tablet Take 0.6 mg by mouth daily as needed (for gout flare up).     . Cyanocobalamin (B-12) 1000 MCG SUBL Place 1,000 mcg under the tongue daily.     . cyclobenzaprine (FLEXERIL) 10 MG tablet Take 10 mg by mouth 2 (two) times daily as needed for muscle spasms.    Marland Kitchen diltiazem (CARDIZEM) 30 MG tablet Take 1 tablet (30 mg total) by mouth 4 (four) times daily as needed (for palpitations/elevated heart rates). 30 tablet 0  . doxazosin (CARDURA) 4 MG tablet Take 4 mg by mouth daily.  4  . finasteride (PROSCAR) 5 MG tablet Take 5 mg by mouth every evening.     . furosemide (LASIX) 40 MG tablet TAKE 1 TABLET BY MOUTH TWICE A DAY 180 tablet 0  . Iron-Vitamin C (VITRON-C) 65-125 MG TABS Take 1 tablet by mouth daily.    Marland Kitchen lisinopril (PRINIVIL,ZESTRIL) 40 MG tablet Take 40 mg by mouth daily.    . nitroGLYCERIN (NITROSTAT) 0.4 MG SL tablet Take as directed (Patient taking differently: Place 0.4 mg under the tongue every 5 (five) minutes as needed for chest pain. ) 25 tablet 3  . pantoprazole (PROTONIX) 40 MG tablet Take 1 tablet (40 mg total) by mouth daily before breakfast. (Patient taking differently: Take 40 mg by mouth every evening. ) 90 tablet 3  . XARELTO 20 MG TABS tablet TAKE 1 TABLET BY MOUTH EVERY DAY 90 tablet 0   No current facility-administered medications for this visit.      Physical Exam: Vitals:   12/07/19 1027  BP: 136/60  Pulse: 64  SpO2: 93%  Weight: 232 lb (105.2 kg)  Height: 5\' 11"  (1.803 m)    GEN- The patient is well appearing, alert and oriented x 3 today.   Head- normocephalic, atraumatic Eyes-  Sclera clear, conjunctiva pink Ears- hearing intact Oropharynx- clear Lungs- Clear to ausculation bilaterally, normal work of breathing Heart- Regular rate and rhythm, no murmurs, rubs or gallops, PMI not laterally displaced GI- soft, NT, ND, + BS Extremities- no clubbing, cyanosis, or edema  Wt Readings from Last 3 Encounters:  12/07/19 232 lb (105.2 kg)  08/24/19 244 lb 9.6 oz (110.9 kg)  08/22/19 249 lb 9.6 oz (113.2 kg)    EKG tracing ordered today is personally reviewed and shows sinus, anteroseptal infarct  Assessment and Plan:  1. Persistent afib Well controlled post ablation off AAD therapy despite severe biatrial enlargment chads2vasc score is 5.  He is on xarelto He was cardioverted 08/08/19 and has done well since  2. OSA Uses CPAP  3. CAD s/p CABG No ischemic symptoms  4. Prior tachycardia mediated CM Resolved with sinus Last echo in 2019 Repeat echo  5. HTN Stable No change required today   Risks, benefits and potential toxicities for medications prescribed and/or refilled reviewed with patient today.   Return in 6 months to see me  Thompson Grayer MD, [facility] 12/07/2019 10:51 AM

## 2019-12-09 ENCOUNTER — Ambulatory Visit: Payer: Medicare Other | Admitting: Cardiovascular Disease

## 2019-12-12 ENCOUNTER — Telehealth: Payer: Self-pay | Admitting: *Deleted

## 2019-12-12 NOTE — Telephone Encounter (Signed)
   Primary Cardiologist: Kate Sable, MD  Chart reviewed as part of pre-operative protocol coverage. Patient was contacted 12/12/2019 in reference to pre-operative risk assessment for pending surgery as outlined below.  Dorell Gatlin Wardlow was last seen on 12/07/19 by [name].  Since that day, BRYANN MCNEALY has done fine from a cardiac standpoint. He has been limited in mobility by back pain over the past several weeks, though prior to this could complete 4 METs without anginal complaints. No complaints of palpitations.   Therefore, based on ACC/AHA guidelines, the patient would be at acceptable risk for the planned procedure without further cardiovascular testing.   Per pharmacy recommendations, patient can hold xarelto 3 days prior to his procedure with plans to restart when cleared to do so by his surgeon.   I will route this recommendation to the requesting party via Epic fax function and remove from pre-op pool. Please call with questions.  Abigail [name], PA-C 12/12/2019, 4:53 PM

## 2019-12-12 NOTE — Telephone Encounter (Signed)
Patient with diagnosis of afib on Xarelto for anticoagulation.    Procedure: LUMBAR ESI Date of procedure: 12/20/19  CHADS2-VASc score of  5 (CHF, HTN, AGE, CAD, AGE)  CrCl 50 ml/min  Per office protocol, patient can hold Xarelto for 3 days prior to procedure.

## 2019-12-12 NOTE — Telephone Encounter (Signed)
   [facility] Pre-operative Risk Assessment    HEARTCARE STAFF: - Please ensure there is not already an duplicate clearance open for this procedure. - Under Visit Info/Reason for Call, type in Other and utilize the format Clearance MM/DD/YY or Clearance TBD. Do not use dashes or single digits. - If request is for dental extraction, please clarify the # of teeth to be extracted.  Request for surgical clearance:  1. What type of surgery is being performed? LUMBAR ESI   2. When is this surgery scheduled? 12/20/19   3. What type of clearance is required (medical clearance vs. Pharmacy clearance to hold med vs. Both)? BOTH  4. Are there any medications that need to be held prior to surgery and how long? XARELTO x 3 DAYS PRIOR TO INJECTION   5. Practice name and name of physician performing surgery? [facility]; DR. Nelva Bush   6. What is the office phone number? [phone]   7.   What is the office fax number? [place].   Anesthesia type (None, local, MAC, general) ? NOT LISTED   Caleb Wolf 12/12/2019, 4:32 PM  _________________________________________________________________   (provider comments below)

## 2019-12-15 ENCOUNTER — Other Ambulatory Visit (HOSPITAL_COMMUNITY): Payer: Medicare Other

## 2019-12-19 ENCOUNTER — Other Ambulatory Visit: Payer: Self-pay

## 2019-12-19 ENCOUNTER — Ambulatory Visit (HOSPITAL_COMMUNITY)
Admission: RE | Admit: 2019-12-19 | Discharge: 2019-12-19 | Disposition: A | Discharge status: Home / Self Care | Payer: Medicare Other | Source: Ambulatory Visit | Attending: Internal Medicine | Admitting: Internal Medicine

## 2019-12-19 DIAGNOSIS — I4819 Other persistent atrial fibrillation: Secondary | ICD-10-CM | POA: Insufficient documentation

## 2019-12-19 DIAGNOSIS — E785 Hyperlipidemia, unspecified: Secondary | ICD-10-CM | POA: Diagnosis not present

## 2019-12-19 DIAGNOSIS — G4733 Obstructive sleep apnea (adult) (pediatric): Secondary | ICD-10-CM | POA: Diagnosis not present

## 2019-12-19 DIAGNOSIS — I10 Essential (primary) hypertension: Secondary | ICD-10-CM | POA: Diagnosis not present

## 2019-12-19 LAB — ECHOCARDIOGRAM COMPLETE
AR max vel: 2 cm2
AV Area VTI: 1.9 cm2
AV Area mean vel: 1.89 cm2
AV Mean grad: 3.8 mmHg
AV Peak grad: 7.1 mmHg
Ao pk vel: 1.33 m/s
Area-P 1/2: 3.03 cm2
S' Lateral: 3.59 cm

## 2019-12-19 NOTE — Progress Notes (Signed)
*  PRELIMINARY RESULTS* Echocardiogram 2D Echocardiogram has been performed.  Samuel Germany 12/19/2019, 11:06 AM

## 2019-12-20 DIAGNOSIS — M5136 Other intervertebral disc degeneration, lumbar region: Secondary | ICD-10-CM | POA: Diagnosis not present

## 2019-12-20 DIAGNOSIS — M961 Postlaminectomy syndrome, not elsewhere classified: Secondary | ICD-10-CM | POA: Diagnosis not present

## 2019-12-21 ENCOUNTER — Ambulatory Visit: Payer: Medicare Other | Admitting: Cardiology

## 2019-12-28 DIAGNOSIS — M545 Low back pain: Secondary | ICD-10-CM | POA: Diagnosis not present

## 2020-01-06 DIAGNOSIS — M48061 Spinal stenosis, lumbar region without neurogenic claudication: Secondary | ICD-10-CM | POA: Diagnosis not present

## 2020-01-17 ENCOUNTER — Encounter (INDEPENDENT_AMBULATORY_CARE_PROVIDER_SITE_OTHER): Payer: Medicare Other | Admitting: Ophthalmology

## 2020-01-25 DIAGNOSIS — L570 Actinic keratosis: Secondary | ICD-10-CM | POA: Diagnosis not present

## 2020-01-25 DIAGNOSIS — L814 Other melanin hyperpigmentation: Secondary | ICD-10-CM | POA: Diagnosis not present

## 2020-01-25 DIAGNOSIS — L821 Other seborrheic keratosis: Secondary | ICD-10-CM | POA: Diagnosis not present

## 2020-01-25 DIAGNOSIS — Z85828 Personal history of other malignant neoplasm of skin: Secondary | ICD-10-CM | POA: Diagnosis not present

## 2020-02-12 ENCOUNTER — Other Ambulatory Visit: Payer: Self-pay | Admitting: Internal Medicine

## 2020-02-12 DIAGNOSIS — Z23 Encounter for immunization: Secondary | ICD-10-CM | POA: Diagnosis not present

## 2020-02-13 ENCOUNTER — Encounter (INDEPENDENT_AMBULATORY_CARE_PROVIDER_SITE_OTHER): Payer: Self-pay | Admitting: Ophthalmology

## 2020-02-13 ENCOUNTER — Other Ambulatory Visit: Payer: Self-pay

## 2020-02-13 ENCOUNTER — Ambulatory Visit (INDEPENDENT_AMBULATORY_CARE_PROVIDER_SITE_OTHER): Payer: Medicare Other | Admitting: Ophthalmology

## 2020-02-13 DIAGNOSIS — G4733 Obstructive sleep apnea (adult) (pediatric): Secondary | ICD-10-CM | POA: Diagnosis not present

## 2020-02-13 DIAGNOSIS — Z9889 Other specified postprocedural states: Secondary | ICD-10-CM

## 2020-02-13 DIAGNOSIS — H4720 Unspecified optic atrophy: Secondary | ICD-10-CM | POA: Diagnosis not present

## 2020-02-13 DIAGNOSIS — H43392 Other vitreous opacities, left eye: Secondary | ICD-10-CM | POA: Insufficient documentation

## 2020-02-13 DIAGNOSIS — H43812 Vitreous degeneration, left eye: Secondary | ICD-10-CM | POA: Diagnosis not present

## 2020-02-13 DIAGNOSIS — H35372 Puckering of macula, left eye: Secondary | ICD-10-CM

## 2020-02-13 MED ORDER — ATORVASTATIN CALCIUM 80 MG PO TABS: 80.0000 mg | ORAL_TABLET | Freq: Every evening | ORAL | 3 refills | Status: DC

## 2020-02-13 NOTE — Assessment & Plan Note (Signed)
Patient compliant with CPAP use, important for macular oxygenation

## 2020-02-13 NOTE — Telephone Encounter (Signed)
Refilled atorvastatin 80 mg qd

## 2020-02-13 NOTE — Progress Notes (Signed)
02/13/2020     CHIEF COMPLAINT Patient presents for Retina Follow Up   HISTORY OF PRESENT ILLNESS: Caleb Wolf is a 79 y.o. male who presents to the clinic today for:   HPI    Retina Follow Up    Patient presents with  Other.  In both eyes.  This started 13 months ago.  Severity is mild.  Duration of 13 months.  Since onset it is stable.          Comments    13 Month F/U OU  Pt denies noticeable changes to [place] OU since last visit. Pt denies ocular pain, flashes of light, or floaters OU.         Last edited by Rockie Neighbours, [place] on 02/13/2020  1:50 PM. (History)      Referring physician: Asencion Noble, MD [address] [place],  [place] [address]  HISTORICAL INFORMATION:   Selected notes from the MEDICAL RECORD NUMBER    Lab Results  Component Value Date   HGBA1C 5.8 (H) 05/23/2014     CURRENT MEDICATIONS: No current outpatient medications on file. (Ophthalmic Drugs)   No current facility-administered medications for this visit. (Ophthalmic Drugs)   Current Outpatient Medications (Other)  Medication Sig  . acetaminophen (TYLENOL) 500 MG tablet Take 1,000 mg by mouth every 6 (six) hours as needed for moderate pain.  Marland Kitchen atorvastatin (LIPITOR) 80 MG tablet Take 1 tablet (80 mg total) by mouth every evening.  . colchicine 0.6 MG tablet Take 0.6 mg by mouth daily as needed (for gout flare up).   . Cyanocobalamin (B-12) 1000 MCG SUBL Place 1,000 mcg under the tongue daily.   . cyclobenzaprine (FLEXERIL) 10 MG tablet Take 10 mg by mouth 2 (two) times daily as needed for muscle spasms.  Marland Kitchen diltiazem (CARDIZEM) 30 MG tablet Take 1 tablet (30 mg total) by mouth 4 (four) times daily as needed (for palpitations/elevated heart rates).  Marland Kitchen doxazosin (CARDURA) 4 MG tablet Take 4 mg by mouth daily.  . finasteride (PROSCAR) 5 MG tablet Take 5 mg by mouth every evening.   . furosemide (LASIX) 40 MG tablet TAKE 1 TABLET BY MOUTH TWICE A DAY  . Iron-Vitamin C (VITRON-C) 65-125  MG TABS Take 1 tablet by mouth daily.  Marland Kitchen lisinopril (PRINIVIL,ZESTRIL) 40 MG tablet Take 40 mg by mouth daily.  . nitroGLYCERIN (NITROSTAT) 0.4 MG SL tablet Take as directed (Patient taking differently: Place 0.4 mg under the tongue every 5 (five) minutes as needed for chest pain. )  . pantoprazole (PROTONIX) 40 MG tablet Take 1 tablet (40 mg total) by mouth daily before breakfast. (Patient taking differently: Take 40 mg by mouth every evening. )  . XARELTO 20 MG TABS tablet TAKE 1 TABLET BY MOUTH EVERY DAY   No current facility-administered medications for this visit. (Other)      REVIEW OF SYSTEMS:    ALLERGIES Allergies  Allergen Reactions  . Codeine Sulfate Nausea Only    PAST MEDICAL HISTORY Past Medical History:  Diagnosis Date  . Arthritis   . Basal cell carcinoma   . Carotid stenosis   . Coronary artery disease    Multvessel s/p CABG 2015  . Enlarged prostate   . Essential hypertension   . GERD (gastroesophageal reflux disease)   . Gout   . History of colon polyps   . History of kidney stones   . Hyperlipidemia   . Lumbar disc disease   . Persistent atrial fibrillation ([place])   .  Sleep apnea    CPAP   Past Surgical History:  Procedure Laterality Date  . ABLATION OF DYSRHYTHMIC FOCUS  07/28/2017  . ATRIAL FIBRILLATION ABLATION N/A 07/28/2017   Procedure: ATRIAL FIBRILLATION ABLATION;  Surgeon: Thompson Grayer, MD;  Location: [place] CV LAB;  Service: Cardiovascular;  Laterality: N/A;  . BACK SURGERY    . CARDIAC CATHETERIZATION  05/22/2014   Procedure: IABP INSERTION;  Surgeon: Leonie Man, MD;  Location: [facility] CATH LAB;  Service: Cardiovascular;;  . CARDIOVERSION N/A 04/29/2017   Procedure: CARDIOVERSION;  Surgeon: Satira Sark, MD;  [facility];  Service: Cardiovascular;  Laterality: N/A;  . CARDIOVERSION N/A 08/13/2017   Procedure: CARDIOVERSION;  Surgeon: Pixie Casino, MD;  Location: [facility] ENDOSCOPY;  Service: Cardiovascular;   Laterality: N/A;  . CARDIOVERSION N/A 08/15/2019   Procedure: CARDIOVERSION;  Surgeon: Dorothy Spark, MD;  Location: [facility] ENDOSCOPY;  Service: Cardiovascular;  Laterality: N/A;  . Cataract surgery Right   . COLONOSCOPY    . COLONOSCOPY N/A 12/30/2017   Procedure: COLONOSCOPY;  Surgeon: Rogene Houston, MD;  [facility];  Service: Endoscopy;  Laterality: N/A;  . CORONARY ARTERY BYPASS GRAFT N/A 05/22/2014   Procedure: CORONARY ARTERY BYPASS GRAFTING (CABG) times three using left internal mammary and right saphenous vein.;  Surgeon: Melrose Nakayama, MD;  Location: [place];  Service: Open Heart Surgery;  Laterality: N/A;  . ENDARTERECTOMY Left 02/17/2014   Procedure: ENDARTERECTOMY CAROTID WITH PATCH ANGIOPLASTY;  Surgeon: Mal Misty, MD;  Location: [place];  Service: Vascular;  Laterality: Left;  . ESOPHAGEAL DILATION N/A 08/22/2015   Procedure: ESOPHAGEAL DILATION;  Surgeon: Rogene Houston, MD;  [facility];  Service: Endoscopy;  Laterality: N/A;  . ESOPHAGOGASTRODUODENOSCOPY N/A 08/22/2015   Procedure: ESOPHAGOGASTRODUODENOSCOPY (EGD);  Surgeon: Rogene Houston, MD;  [facility];  Service: Endoscopy;  Laterality: N/A;  12:45 - moved to 1:55 - Ann notified pt  . ESOPHAGOGASTRODUODENOSCOPY N/A 12/30/2017   Procedure: ESOPHAGOGASTRODUODENOSCOPY (EGD);  Surgeon: Rogene Houston, MD;  [facility];  Service: Endoscopy;  Laterality: N/A;  200  . EYE SURGERY     cataract extraction (right) with repair macular tear , with IOL     right  . FRACTURE SURGERY     bilateral wrist fractures- one ORIF  . JOINT REPLACEMENT  2011   left knee  . LEFT HEART CATHETERIZATION WITH CORONARY ANGIOGRAM N/A 05/22/2014   Procedure: LEFT HEART CATHETERIZATION WITH CORONARY ANGIOGRAM;  Surgeon: Leonie Man, MD;  Location: [facility] CATH LAB;  Service: Cardiovascular;  Laterality: N/A;  . LITHOTRIPSY    . PARS PLANA VITRECTOMY W/ REPAIR OF MACULAR HOLE    . RHINOPLASTY     . TEE WITHOUT CARDIOVERSION N/A 04/29/2017   Procedure: TRANSESOPHAGEAL ECHOCARDIOGRAM (TEE) WITH PROPOFOL;  Surgeon: Satira Sark, MD;  [facility];  Service: Cardiovascular;  Laterality: N/A;  . TONSILLECTOMY    . TOTAL HIP ARTHROPLASTY  12/08/2011   Procedure: TOTAL HIP ARTHROPLASTY;  Surgeon: Gearlean Alf, MD;  [facility];  Service: Orthopedics;  Laterality: Right;  . UPPER GI ENDOSCOPY  12/18/2015   Procedure: UPPER GI ENDOSCOPY;  Surgeon: Ralene Ok, MD;  [facility];  Service: General;;  . WRIST SURGERY Right 72yrs ago  . WRIST SURGERY Left     FAMILY HISTORY Family History  Problem Relation Age of Onset  . Allergies Mother   . Heart disease Mother   .  Hypertension Mother   . Heart disease Father        MI    SOCIAL HISTORY Social History   Tobacco Use  . Smoking status: Former Smoker    Packs/day: 1.50    Years: 25.00    Pack years: 37.50    Types: Cigarettes    Start date: 08/08/1960    Quit date: 06/02/1982    Years since quitting: 37.7  . Smokeless tobacco: Never Used  . Tobacco comment: quit smoking 30+yrs ago  Vaping Use  . Vaping Use: Never used  Substance Use Topics  . Alcohol use: No    Alcohol/week: 0.0 standard drinks    Comment: occasionally   . Drug use: No         OPHTHALMIC EXAM:  Base Eye Exam    Visual Acuity (ETDRS)      Right Left   Dist cc 20/40 20/25 -1   Dist ph cc 20/40 +2    Correction: Glasses       Tonometry (Tonopen, 1:52 PM)      Right Left   Pressure 12 15       Pupils      Pupils Dark Light Shape React APD   Right PERRL 5 4 Round Brisk None   Left PERRL 5 4 Round Brisk None       Visual Fields (Counting fingers)      Left Right    Full Full       Extraocular Movement      Right Left    Full Full       Neuro/Psych    Oriented x3: Yes   Mood/Affect: Normal       Dilation    Both eyes: 1.0% Mydriacyl, 2.5% Phenylephrine @ 1:55 PM        Slit Lamp and Fundus Exam      External Exam      Right Left   External Normal Normal       Slit Lamp Exam      Right Left   Lids/Lashes Normal Normal   Conjunctiva/Sclera White and quiet White and quiet   Cornea Clear Clear   Anterior Chamber Deep and quiet Deep and quiet   Iris Round and reactive Round and reactive   Lens Posterior chamber intraocular lens, Centered posterior chamber intraocular lens Posterior chamber intraocular lens   Anterior Vitreous Normal Normal       Fundus Exam      Right Left   Posterior Vitreous Clear, vitrectomized Posterior vitreous detachment   Disc Normal Normal   C/D Ratio 0.3 0.3   Macula No topographic distortion Clinical epiretinal membrane temporal to the fovea, no foveal distortion   Vessels Normal Normal   Periphery Normal Normal          IMAGING AND PROCEDURES  Imaging and Procedures for 02/13/20  OCT, Retina - OU - Both Eyes       Right Eye Quality was good. Scan locations included subfoveal. Central Foveal Thickness: 391. Progression has been stable. Findings include abnormal foveal contour.   Left Eye Quality was good. Scan locations included subfoveal. Central Foveal Thickness: 269. Findings include abnormal foveal contour.   Notes OD post membrane peel years ago, stable and chronic retinal thickening likely some white due to residual sickening from epiretinal membrane.  But also the patient does have sleep apnea.  I do not see complete resolution of the retinal thickening in folks even with untreated sleep apnea.  OS  with minor inner temporal aspect of the fovea from epiretinal membrane, not clinically significant                ASSESSMENT/PLAN:  OSA (obstructive sleep apnea) Patient compliant with CPAP use, important for macular oxygenation  Posterior vitreous detachment of left eye   The nature of posterior vitreous detachment was discussed with the patient as well as its physiology, its age prevalence, and its possible implication  regarding retinal breaks and detachment.  An informational brochure was given to the patient.  All the patient's questions were answered.  The patient was asked to return if new or different flashes or floaters develops.   Patient was instructed to contact office immediately if any changes were noticed. I explained to the patient that vitreous inside the eye is similar to jello inside a bowl. As the jello melts it can start to pull away from the bowl, similarly the vitreous throughout our lives can begin to pull away from the retina. That process is called a posterior vitreous detachment. In some cases, the vitreous can tug hard enough on the retina to form a retinal tear. I discussed with the patient the signs and symptoms of a retinal detachment.  Do not rub the eye.      ICD-10-CM   1. Left epiretinal membrane  H35.372 OCT, Retina - OU - Both Eyes  2. 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 Vitreous floaters, left  H43.392   5. History of vitrectomy  Z98.890    History of epiretinal membrane right eye, stable  6. OSA (obstructive sleep apnea)  G47.33     1.  Instructed to continue with routine examinations with Dr. Duanne Guess.  2.  Follow-up here at any time for minor epiretinal membrane left eye  3.  Ophthalmic Meds Ordered this visit:  No orders of the defined types were placed in this encounter.      Return in about 2 years (around 02/12/2022) for DILATE OU.  There are no Patient Instructions on file for this visit.   Explained the diagnoses, plan, and follow up with the patient and they expressed understanding.  Patient expressed understanding of the importance of proper follow up care.   Clent Demark Toshi Ishii [name] Diseases & Surgery of the Retina and Vitreous [facility] [place] 02/13/20     Abbreviations: M myopia (nearsighted); A astigmatism; H hyperopia (farsighted); P presbyopia; Mrx spectacle prescription;   CTL contact lenses; OD right eye; OS left eye; OU both eyes  XT exotropia; ET esotropia; PEK punctate epithelial keratitis; PEE punctate epithelial erosions; DES dry eye syndrome; MGD meibomian gland dysfunction; ATs artificial tears; PFAT's preservative free artificial tears; [place] nuclear sclerotic cataract; PSC posterior subcapsular cataract; ERM epi-retinal membrane; PVD posterior vitreous detachment; RD retinal detachment; DM diabetes mellitus; DR diabetic retinopathy; NPDR non-proliferative diabetic retinopathy; PDR proliferative diabetic retinopathy; CSME clinically significant macular edema; DME diabetic macular edema; dbh dot blot hemorrhages; CWS cotton wool spot; POAG primary open angle glaucoma; C/D cup-to-disc ratio; HVF humphrey visual field; GVF goldmann visual field; OCT optical coherence tomography; IOP intraocular pressure; BRVO Branch retinal vein occlusion; CRVO central retinal vein occlusion; CRAO central retinal artery occlusion; BRAO branch retinal artery occlusion; RT retinal tear; SB scleral buckle; PPV pars plana vitrectomy; VH Vitreous hemorrhage; PRP panretinal laser photocoagulation; IVK intravitreal kenalog; VMT vitreomacular traction; MH Macular hole;  NVD neovascularization of the disc; NVE neovascularization elsewhere;  AREDS age related eye disease study; ARMD age related macular degeneration; POAG primary open angle glaucoma; EBMD epithelial/anterior basement membrane dystrophy; ACIOL anterior chamber intraocular lens; IOL intraocular lens; PCIOL posterior chamber intraocular lens; Phaco/IOL phacoemulsification with intraocular lens placement; [place] photorefractive keratectomy; LASIK laser assisted in situ keratomileusis; HTN hypertension; DM diabetes mellitus; COPD chronic obstructive pulmonary disease

## 2020-02-13 NOTE — Telephone Encounter (Signed)
Order Providers  Prescribing Provider Encounter Provider  Thompson Grayer, MD Carter Kitten D, CMA  Outpatient Medication Detail   Disp Refills Start End   atorvastatin (LIPITOR) 80 MG tablet 90 tablet 3 02/13/2020    Sig - Route: Take 1 tablet (80 mg total) by mouth every evening. - Oral   Sent to pharmacy as: atorvastatin (LIPITOR) 80 MG tablet   E-Prescribing Status: Receipt confirmed by pharmacy (02/13/2020  9:23 AM EDT)   Pharmacy  [id number] - [place], [place] - [place]

## 2020-02-13 NOTE — Assessment & Plan Note (Signed)

## 2020-02-13 NOTE — Telephone Encounter (Signed)
Pt calling requesting a refill on atorvastatin. Pt stated that [name] said that he would take over Dr. Court Joy medication that he prescribed for the pt. Would [name] like to refill pt's atorvastatin? Please address

## 2020-02-26 ENCOUNTER — Other Ambulatory Visit: Payer: Self-pay | Admitting: Internal Medicine

## 2020-02-27 ENCOUNTER — Other Ambulatory Visit: Payer: Self-pay | Admitting: *Deleted

## 2020-02-27 MED ORDER — RIVAROXABAN 20 MG PO TABS: 20.0000 mg | ORAL_TABLET | Freq: Every day | ORAL | 0 refills | Status: DC

## 2020-03-02 ENCOUNTER — Telehealth: Payer: Self-pay | Admitting: Internal Medicine

## 2020-03-02 NOTE — Telephone Encounter (Signed)
Pt is calling in with complaints of being in afib since last Saturday.  Pt states he has been out of town, and that's when it began.  Pt just returned back in town this morning.  Pt states since last Saturday he has been in afib, with his heart rate fluctuating between 60s-130s.  Pt states his BP remains stable in the 244W systolic and 10U diastolic. Pt states he does experience palpitations with his afib.  He states he gets sob only when exerting.  He complains of no chest pain.  Pt states he does get lightheaded and dizzy as well.  Pt complains of no DOE, N/V, diaphoresis, pre-syncopal or syncopal episodes.  Pt states he is going this morning to pick up his PRN Diltiazem [name] sent to his pharmacy on 9/27. He states he will take this as instructed by [name].  Pt states he would like to be seen by [name] only, for ongoing afib.  Pt feels that he may need to have [name] schedule him for a cardioversion, or make further changes to his medication regimen. Offered him a sooner appt with EP APP and EP DOD, but he declined.  Scheduled the pt to come in and see [name] for next Monday 10/4 at 1030.  Pt is aware to arrive 15 mins prior to this appt.  ED precautions provided to the pt, if symptoms were to worsen between now and his appt next Monday. Advised the pt to go and pick up his PRN Diltiazem, and take this as instructed.  Advised him not to over-exert himself over the weekend.  Informed the pt that I will route this message to [name] and his covering RN, for further review upon return to the office on Monday.  Pt verbalized understanding and agrees with this plan. Pt was more than gracious for all the assistance provided.

## 2020-03-02 NOTE — Telephone Encounter (Signed)
Patient c/o Palpitations:  High priority if patient c/o lightheadedness, shortness of breath, or chest pain  1) How long have you had palpitations/irregular HR/ Afib? Are you having the symptoms now? yes  2) Are you currently experiencing lightheadedness, SOB or CP? Intermittent arm pain  3) Do you have a history of afib (atrial fibrillation) or irregular heart rhythm? yes  4) Have you checked your BP or HR? (document readings if available): 130/70 HR 150  5) Are you experiencing any other symptoms? Fatigue, lightheaded   Pt has been in afib for ~ 5 days now

## 2020-03-05 ENCOUNTER — Encounter: Payer: Self-pay | Admitting: Internal Medicine

## 2020-03-05 ENCOUNTER — Encounter: Payer: Self-pay | Admitting: *Deleted

## 2020-03-05 ENCOUNTER — Other Ambulatory Visit: Payer: Self-pay

## 2020-03-05 ENCOUNTER — Ambulatory Visit (INDEPENDENT_AMBULATORY_CARE_PROVIDER_SITE_OTHER): Payer: Medicare Other | Admitting: Internal Medicine

## 2020-03-05 VITALS — BP 124/80 | HR 127 | Ht 71.0 in | Wt 235.0 lb

## 2020-03-05 DIAGNOSIS — N401 Enlarged prostate with lower urinary tract symptoms: Secondary | ICD-10-CM | POA: Diagnosis not present

## 2020-03-05 DIAGNOSIS — N3941 Urge incontinence: Secondary | ICD-10-CM | POA: Diagnosis not present

## 2020-03-05 DIAGNOSIS — I25708 Atherosclerosis of coronary artery bypass graft(s), unspecified, with other forms of angina pectoris: Secondary | ICD-10-CM | POA: Diagnosis not present

## 2020-03-05 DIAGNOSIS — G4733 Obstructive sleep apnea (adult) (pediatric): Secondary | ICD-10-CM

## 2020-03-05 DIAGNOSIS — N200 Calculus of kidney: Secondary | ICD-10-CM | POA: Diagnosis not present

## 2020-03-05 DIAGNOSIS — E291 Testicular hypofunction: Secondary | ICD-10-CM | POA: Diagnosis not present

## 2020-03-05 DIAGNOSIS — N312 Flaccid neuropathic bladder, not elsewhere classified: Secondary | ICD-10-CM | POA: Diagnosis not present

## 2020-03-05 DIAGNOSIS — I4819 Other persistent atrial fibrillation: Secondary | ICD-10-CM | POA: Diagnosis not present

## 2020-03-05 DIAGNOSIS — I10 Essential (primary) hypertension: Secondary | ICD-10-CM | POA: Diagnosis not present

## 2020-03-05 DIAGNOSIS — E785 Hyperlipidemia, unspecified: Secondary | ICD-10-CM

## 2020-03-05 DIAGNOSIS — N529 Male erectile dysfunction, unspecified: Secondary | ICD-10-CM | POA: Diagnosis not present

## 2020-03-05 NOTE — Patient Instructions (Addendum)
Medication Instructions:  Your physician recommends that you continue on your current medications as directed. Please refer to the Current Medication list given to you today.  *If you need a refill on your cardiac medications before your next appointment, please call your pharmacy*  Lab Work: None ordered.  If you have labs (blood work) drawn today and your tests are completely normal, you will receive your results only by: Marland Kitchen MyChart Message (if you have MyChart) OR . A paper copy in the mail If you have any lab test that is abnormal or we need to change your treatment, we will call you to review the results.  Testing/Procedures: Cardioversion  Follow-Up: At [facility], you and your health needs are our priority.  As part of our continuing mission to provide you with exceptional heart care, we have created designated Provider Care Teams.  These Care Teams include your primary Cardiologist (physician) and Advanced Practice Providers (APPs -  Physician Assistants and Nurse Practitioners) who all work together to provide you with the care you need, when you need it.  We recommend signing up for the patient portal called "MyChart".  Sign up information is provided on this After Visit Summary.  MyChart is used to connect with patients for Virtual Visits (Telemedicine).  Patients are able to view lab/test results, encounter notes, upcoming appointments, etc.  Non-urgent messages can be sent to your provider as well.   To learn more about what you can do with MyChart, go to [email or web address].    Your next appointment:   Your physician wants you to follow-up in: Afib clinic one week post cardioversion. As schedule with [name].   Other Instructions:

## 2020-03-05 NOTE — Progress Notes (Signed)
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  Since last being seen in our clinic, the patient reports doing reasonably well.  He has returned to afib.  + palpitations and fatigue. Today, he denies symptoms of chest pain, shortness of breath,  lower extremity edema, dizziness, presyncope, or syncope.  The patient is otherwise without complaint today.   Past Medical History:  Diagnosis Date  . Arthritis   . Basal cell carcinoma   . Carotid stenosis   . Coronary artery disease    Multvessel s/p CABG 2015  . Enlarged prostate   . Essential hypertension   . GERD (gastroesophageal reflux disease)   . Gout   . History of colon polyps   . History of kidney stones   . Hyperlipidemia   . Lumbar disc disease   . Persistent atrial fibrillation ([place])   . Sleep apnea    CPAP   Past Surgical History:  Procedure Laterality Date  . ABLATION OF DYSRHYTHMIC FOCUS  07/28/2017  . ATRIAL FIBRILLATION ABLATION N/A 07/28/2017   Procedure: ATRIAL FIBRILLATION ABLATION;  Surgeon: Thompson Grayer, MD;  Location: [place] CV LAB;  Service: Cardiovascular;  Laterality: N/A;  . BACK SURGERY    . CARDIAC CATHETERIZATION  05/22/2014   Procedure: IABP INSERTION;  Surgeon: Leonie Man, MD;  Location: [facility] CATH LAB;  Service: Cardiovascular;;  . CARDIOVERSION N/A 04/29/2017   Procedure: CARDIOVERSION;  Surgeon: Satira Sark, MD;  [facility];  Service: Cardiovascular;  Laterality: N/A;  . CARDIOVERSION N/A 08/13/2017   Procedure: CARDIOVERSION;  Surgeon: Pixie Casino, MD;  Location: [facility] ENDOSCOPY;  Service: Cardiovascular;  Laterality: N/A;  . CARDIOVERSION N/A 08/15/2019   Procedure: CARDIOVERSION;  Surgeon: Dorothy Spark, MD;  Location: [facility] ENDOSCOPY;  Service: Cardiovascular;  Laterality: N/A;  . Cataract surgery Right   . COLONOSCOPY    . COLONOSCOPY N/A 12/30/2017   Procedure:  COLONOSCOPY;  Surgeon: Rogene Houston, MD;  [facility];  Service: Endoscopy;  Laterality: N/A;  . CORONARY ARTERY BYPASS GRAFT N/A 05/22/2014   Procedure: CORONARY ARTERY BYPASS GRAFTING (CABG) times three using left internal mammary and right saphenous vein.;  Surgeon: Melrose Nakayama, MD;  Location: [place];  Service: Open Heart Surgery;  Laterality: N/A;  . ENDARTERECTOMY Left 02/17/2014   Procedure: ENDARTERECTOMY CAROTID WITH PATCH ANGIOPLASTY;  Surgeon: Mal Misty, MD;  Location: [place];  Service: Vascular;  Laterality: Left;  . ESOPHAGEAL DILATION N/A 08/22/2015   Procedure: ESOPHAGEAL DILATION;  Surgeon: Rogene Houston, MD;  [facility];  Service: Endoscopy;  Laterality: N/A;  . ESOPHAGOGASTRODUODENOSCOPY N/A 08/22/2015   Procedure: ESOPHAGOGASTRODUODENOSCOPY (EGD);  Surgeon: Rogene Houston, MD;  [facility];  Service: Endoscopy;  Laterality: N/A;  12:45 - moved to 1:55 - Ann notified pt  . ESOPHAGOGASTRODUODENOSCOPY N/A 12/30/2017   Procedure: ESOPHAGOGASTRODUODENOSCOPY (EGD);  Surgeon: Rogene Houston, MD;  [facility];  Service: Endoscopy;  Laterality: N/A;  200  . EYE SURGERY     cataract extraction (right) with repair macular tear , with IOL     right  . FRACTURE SURGERY     bilateral wrist fractures- one ORIF  . JOINT REPLACEMENT  2011   left knee  . LEFT HEART CATHETERIZATION WITH CORONARY ANGIOGRAM N/A 05/22/2014   Procedure: LEFT HEART CATHETERIZATION WITH CORONARY ANGIOGRAM;  Surgeon: Shanon Brow  Loren Racer, MD;  Location: [facility] CATH LAB;  Service: Cardiovascular;  Laterality: N/A;  . LITHOTRIPSY    . PARS PLANA VITRECTOMY W/ REPAIR OF MACULAR HOLE    . RHINOPLASTY    . TEE WITHOUT CARDIOVERSION N/A 04/29/2017   Procedure: TRANSESOPHAGEAL ECHOCARDIOGRAM (TEE) WITH PROPOFOL;  Surgeon: Satira Sark, MD;  [facility];  Service: Cardiovascular;  Laterality: N/A;  . TONSILLECTOMY    . TOTAL HIP ARTHROPLASTY   12/08/2011   Procedure: TOTAL HIP ARTHROPLASTY;  Surgeon: Gearlean Alf, MD;  [facility]: [facility];  Service: Orthopedics;  Laterality: Right;  . UPPER GI ENDOSCOPY  12/18/2015   Procedure: UPPER GI ENDOSCOPY;  Surgeon: Ralene Ok, MD;  Location: [facility];  Service: General;;  . WRIST SURGERY Right 58yrs ago  . WRIST SURGERY Left     ROS- all systems are reviewed and negatives except as per HPI above  Current Outpatient Medications  Medication Sig Dispense Refill  . acetaminophen (TYLENOL) 500 MG tablet Take 1,000 mg by mouth every 6 (six) hours as needed for moderate pain.    Marland Kitchen atorvastatin (LIPITOR) 80 MG tablet Take 1 tablet (80 mg total) by mouth every evening. 90 tablet 3  . colchicine 0.6 MG tablet Take 0.6 mg by mouth daily as needed (for gout flare up).     . Cyanocobalamin (B-12) 1000 MCG SUBL Place 1,000 mcg under the tongue daily.     . cyclobenzaprine (FLEXERIL) 10 MG tablet Take 10 mg by mouth 2 (two) times daily as needed for muscle spasms.    Marland Kitchen diltiazem (CARDIZEM) 30 MG tablet TAKE 1 TABLET BY MOUTH 4 (FOUR) TIMES DAILY AS NEEDED (FOR PALPITATIONS/ELEVATED HEART RATES). 30 tablet 0  . doxazosin (CARDURA) 4 MG tablet Take 4 mg by mouth daily.  4  . finasteride (PROSCAR) 5 MG tablet Take 5 mg by mouth every evening.     . furosemide (LASIX) 40 MG tablet TAKE 1 TABLET BY MOUTH TWICE A DAY 180 tablet 0  . lisinopril (PRINIVIL,ZESTRIL) 40 MG tablet Take 40 mg by mouth daily.    . nitroGLYCERIN (NITROSTAT) 0.4 MG SL tablet Take as directed 25 tablet 3  . pantoprazole (PROTONIX) 40 MG tablet Take 1 tablet (40 mg total) by mouth daily before breakfast. 90 tablet 3  . rivaroxaban (XARELTO) 20 MG TABS tablet Take 1 tablet (20 mg total) by mouth daily. 30 tablet 0   No current facility-administered medications for this visit.    Physical Exam: Vitals:   03/05/20 1033  BP: 124/80  Pulse: (!) 127  SpO2: 97%  Weight: 235 lb (106.6 kg)  Height: 5\' 11"  (1.803 m)    GEN- The  patient is well appearing, alert and oriented x 3 today.   Head- normocephalic, atraumatic Eyes-  Sclera clear, conjunctiva pink Ears- hearing intact Oropharynx- clear Lungs-   normal work of breathing Heart- tachycardic irregular rhythm  GI- soft  Extremities- no clubbing, cyanosis, or edema  Wt Readings from Last 3 Encounters:  03/05/20 235 lb (106.6 kg)  12/07/19 232 lb (105.2 kg)  08/24/19 244 lb 9.6 oz (110.9 kg)   Echo 12/19/19- EF 50-55%, moderate biatrial enlargement  EKG tracing ordered today is personally reviewed and shows afib with RVR  Assessment and Plan:  1. Persistent afib Previously well controlled post ablation off AAD therapy.  He was cardioverted in March and is now back in afib again.  He thinks that he "overdid it".  He also had his covid booster  about a week prior to AF onset. chads2vasc score is 5.  He is on xarelto We discussed options at length including, AADs (multaq, tikosyn, amiodarone) and repeat ablation.  For now, we agree with cardioverion and watchful waiting.  If he has further afib, we will likely consider repeat ablation. Risks of cardioversion including stroke and anesthesia reaction were discussed at length with patient who wishes to proceed.  2. CAD s/p CABG No ischemic symptoms No changes  3. OSA Uses CPAP  4. Tachycardia mediated CM Resolved with sinus rhythm  5. HTN Stable No change required today   Risks, benefits and potential toxicities for medications prescribed and/or refilled reviewed with patient today.  F/u in AF clinic 1 week after cardioversion I will see again in 3 months   Thompson Grayer MD, [facility] 03/05/2020 10:58 AM

## 2020-03-05 NOTE — H&P (View-Only) (Signed)
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  Since last being seen in our clinic, the patient reports doing reasonably well.  He has returned to afib.  + palpitations and fatigue. Today, he denies symptoms of chest pain, shortness of breath,  lower extremity edema, dizziness, presyncope, or syncope.  The patient is otherwise without complaint today.   Past Medical History:  Diagnosis Date  . Arthritis   . Basal cell carcinoma   . Carotid stenosis   . Coronary artery disease    Multvessel s/p CABG 2015  . Enlarged prostate   . Essential hypertension   . GERD (gastroesophageal reflux disease)   . Gout   . History of colon polyps   . History of kidney stones   . Hyperlipidemia   . Lumbar disc disease   . Persistent atrial fibrillation ([place])   . Sleep apnea    CPAP   Past Surgical History:  Procedure Laterality Date  . ABLATION OF DYSRHYTHMIC FOCUS  07/28/2017  . ATRIAL FIBRILLATION ABLATION N/A 07/28/2017   Procedure: ATRIAL FIBRILLATION ABLATION;  Surgeon: Thompson Grayer, MD;  Location: [place] CV LAB;  Service: Cardiovascular;  Laterality: N/A;  . BACK SURGERY    . CARDIAC CATHETERIZATION  05/22/2014   Procedure: IABP INSERTION;  Surgeon: Leonie Man, MD;  Location: [facility] CATH LAB;  Service: Cardiovascular;;  . CARDIOVERSION N/A 04/29/2017   Procedure: CARDIOVERSION;  Surgeon: Satira Sark, MD;  [facility];  Service: Cardiovascular;  Laterality: N/A;  . CARDIOVERSION N/A 08/13/2017   Procedure: CARDIOVERSION;  Surgeon: Pixie Casino, MD;  Location: [facility] ENDOSCOPY;  Service: Cardiovascular;  Laterality: N/A;  . CARDIOVERSION N/A 08/15/2019   Procedure: CARDIOVERSION;  Surgeon: Dorothy Spark, MD;  Location: [facility] ENDOSCOPY;  Service: Cardiovascular;  Laterality: N/A;  . Cataract surgery Right   . COLONOSCOPY    . COLONOSCOPY N/A 12/30/2017   Procedure:  COLONOSCOPY;  Surgeon: Rogene Houston, MD;  [facility];  Service: Endoscopy;  Laterality: N/A;  . CORONARY ARTERY BYPASS GRAFT N/A 05/22/2014   Procedure: CORONARY ARTERY BYPASS GRAFTING (CABG) times three using left internal mammary and right saphenous vein.;  Surgeon: Melrose Nakayama, MD;  Location: [place];  Service: Open Heart Surgery;  Laterality: N/A;  . ENDARTERECTOMY Left 02/17/2014   Procedure: ENDARTERECTOMY CAROTID WITH PATCH ANGIOPLASTY;  Surgeon: Mal Misty, MD;  Location: [place];  Service: Vascular;  Laterality: Left;  . ESOPHAGEAL DILATION N/A 08/22/2015   Procedure: ESOPHAGEAL DILATION;  Surgeon: Rogene Houston, MD;  [facility];  Service: Endoscopy;  Laterality: N/A;  . ESOPHAGOGASTRODUODENOSCOPY N/A 08/22/2015   Procedure: ESOPHAGOGASTRODUODENOSCOPY (EGD);  Surgeon: Rogene Houston, MD;  [facility];  Service: Endoscopy;  Laterality: N/A;  12:45 - moved to 1:55 - Ann notified pt  . ESOPHAGOGASTRODUODENOSCOPY N/A 12/30/2017   Procedure: ESOPHAGOGASTRODUODENOSCOPY (EGD);  Surgeon: Rogene Houston, MD;  [facility];  Service: Endoscopy;  Laterality: N/A;  200  . EYE SURGERY     cataract extraction (right) with repair macular tear , with IOL     right  . FRACTURE SURGERY     bilateral wrist fractures- one ORIF  . JOINT REPLACEMENT  2011   left knee  . LEFT HEART CATHETERIZATION WITH CORONARY ANGIOGRAM N/A 05/22/2014   Procedure: LEFT HEART CATHETERIZATION WITH CORONARY ANGIOGRAM;  Surgeon: Shanon Brow  Loren Racer, MD;  Location: [facility] CATH LAB;  Service: Cardiovascular;  Laterality: N/A;  . LITHOTRIPSY    . PARS PLANA VITRECTOMY W/ REPAIR OF MACULAR HOLE    . RHINOPLASTY    . TEE WITHOUT CARDIOVERSION N/A 04/29/2017   Procedure: TRANSESOPHAGEAL ECHOCARDIOGRAM (TEE) WITH PROPOFOL;  Surgeon: Satira Sark, MD;  [facility];  Service: Cardiovascular;  Laterality: N/A;  . TONSILLECTOMY    . TOTAL HIP ARTHROPLASTY   12/08/2011   Procedure: TOTAL HIP ARTHROPLASTY;  Surgeon: Gearlean Alf, MD;  [facility];  Service: Orthopedics;  Laterality: Right;  . UPPER GI ENDOSCOPY  12/18/2015   Procedure: UPPER GI ENDOSCOPY;  Surgeon: Ralene Ok, MD;  [facility];  Service: General;;  . WRIST SURGERY Right 71yrs ago  . WRIST SURGERY Left     ROS- all systems are reviewed and negatives except as per HPI above  Current Outpatient Medications  Medication Sig Dispense Refill  . acetaminophen (TYLENOL) 500 MG tablet Take 1,000 mg by mouth every 6 (six) hours as needed for moderate pain.    Marland Kitchen atorvastatin (LIPITOR) 80 MG tablet Take 1 tablet (80 mg total) by mouth every evening. 90 tablet 3  . colchicine 0.6 MG tablet Take 0.6 mg by mouth daily as needed (for gout flare up).     . Cyanocobalamin (B-12) 1000 MCG SUBL Place 1,000 mcg under the tongue daily.     . cyclobenzaprine (FLEXERIL) 10 MG tablet Take 10 mg by mouth 2 (two) times daily as needed for muscle spasms.    Marland Kitchen diltiazem (CARDIZEM) 30 MG tablet TAKE 1 TABLET BY MOUTH 4 (FOUR) TIMES DAILY AS NEEDED (FOR PALPITATIONS/ELEVATED HEART RATES). 30 tablet 0  . doxazosin (CARDURA) 4 MG tablet Take 4 mg by mouth daily.  4  . finasteride (PROSCAR) 5 MG tablet Take 5 mg by mouth every evening.     . furosemide (LASIX) 40 MG tablet TAKE 1 TABLET BY MOUTH TWICE A DAY 180 tablet 0  . lisinopril (PRINIVIL,ZESTRIL) 40 MG tablet Take 40 mg by mouth daily.    . nitroGLYCERIN (NITROSTAT) 0.4 MG SL tablet Take as directed 25 tablet 3  . pantoprazole (PROTONIX) 40 MG tablet Take 1 tablet (40 mg total) by mouth daily before breakfast. 90 tablet 3  . rivaroxaban (XARELTO) 20 MG TABS tablet Take 1 tablet (20 mg total) by mouth daily. 30 tablet 0   No current facility-administered medications for this visit.    Physical Exam: Vitals:   03/05/20 1033  BP: 124/80  Pulse: (!) 127  SpO2: 97%  Weight: 235 lb (106.6 kg)  Height: 5\' 11"  (1.803 m)    GEN- The  patient is well appearing, alert and oriented x 3 today.   Head- normocephalic, atraumatic Eyes-  Sclera clear, conjunctiva pink Ears- hearing intact Oropharynx- clear Lungs-   normal work of breathing Heart- tachycardic irregular rhythm  GI- soft  Extremities- no clubbing, cyanosis, or edema  Wt Readings from Last 3 Encounters:  03/05/20 235 lb (106.6 kg)  12/07/19 232 lb (105.2 kg)  08/24/19 244 lb 9.6 oz (110.9 kg)   Echo 12/19/19- EF 50-55%, moderate biatrial enlargement  EKG tracing ordered today is personally reviewed and shows afib with RVR  Assessment and Plan:  1. Persistent afib Previously well controlled post ablation off AAD therapy.  He was cardioverted in March and is now back in afib again.  He thinks that he "overdid it".  He also had his covid booster  about a week prior to AF onset. chads2vasc score is 5.  He is on xarelto We discussed options at length including, AADs (multaq, tikosyn, amiodarone) and repeat ablation.  For now, we agree with cardioverion and watchful waiting.  If he has further afib, we will likely consider repeat ablation. Risks of cardioversion including stroke and anesthesia reaction were discussed at length with patient who wishes to proceed.  2. CAD s/p CABG No ischemic symptoms No changes  3. OSA Uses CPAP  4. Tachycardia mediated CM Resolved with sinus rhythm  5. HTN Stable No change required today   Risks, benefits and potential toxicities for medications prescribed and/or refilled reviewed with patient today.  F/u in AF clinic 1 week after cardioversion I will see again in 3 months   Thompson Grayer MD, [facility] 03/05/2020 10:58 AM

## 2020-03-06 ENCOUNTER — Other Ambulatory Visit: Payer: Self-pay | Admitting: Internal Medicine

## 2020-03-08 ENCOUNTER — Other Ambulatory Visit (HOSPITAL_COMMUNITY)
Admission: RE | Admit: 2020-03-08 | Discharge: 2020-03-08 | Disposition: A | Discharge status: Home / Self Care | Payer: Medicare Other | Source: Ambulatory Visit | Attending: Cardiology | Admitting: Cardiology

## 2020-03-08 DIAGNOSIS — Z01818 Encounter for other preprocedural examination: Secondary | ICD-10-CM | POA: Diagnosis not present

## 2020-03-08 DIAGNOSIS — Z20822 Contact with and (suspected) exposure to covid-19: Secondary | ICD-10-CM | POA: Diagnosis not present

## 2020-03-08 LAB — SARS CORONAVIRUS 2 (TAT 6-24 HRS): SARS Coronavirus 2: NEGATIVE

## 2020-03-09 ENCOUNTER — Encounter (HOSPITAL_COMMUNITY): Payer: Self-pay | Admitting: Cardiology

## 2020-03-09 ENCOUNTER — Other Ambulatory Visit: Payer: Self-pay

## 2020-03-09 ENCOUNTER — Ambulatory Visit (HOSPITAL_COMMUNITY): Payer: Medicare Other | Admitting: Anesthesiology

## 2020-03-09 ENCOUNTER — Encounter (HOSPITAL_COMMUNITY)
Admission: RE | Disposition: A | Discharge status: Home / Self Care | Payer: Self-pay | Source: Home / Self Care | Attending: Cardiology

## 2020-03-09 ENCOUNTER — Ambulatory Visit (HOSPITAL_COMMUNITY)
Admission: RE | Admit: 2020-03-09 | Discharge: 2020-03-09 | Disposition: A | Discharge status: Home / Self Care | Payer: Medicare Other | Attending: Cardiology | Admitting: Cardiology

## 2020-03-09 DIAGNOSIS — G4733 Obstructive sleep apnea (adult) (pediatric): Secondary | ICD-10-CM | POA: Insufficient documentation

## 2020-03-09 DIAGNOSIS — K219 Gastro-esophageal reflux disease without esophagitis: Secondary | ICD-10-CM | POA: Insufficient documentation

## 2020-03-09 DIAGNOSIS — M1990 Unspecified osteoarthritis, unspecified site: Secondary | ICD-10-CM | POA: Diagnosis not present

## 2020-03-09 DIAGNOSIS — M109 Gout, unspecified: Secondary | ICD-10-CM | POA: Diagnosis not present

## 2020-03-09 DIAGNOSIS — I4819 Other persistent atrial fibrillation: Secondary | ICD-10-CM | POA: Insufficient documentation

## 2020-03-09 DIAGNOSIS — I110 Hypertensive heart disease with heart failure: Secondary | ICD-10-CM | POA: Diagnosis not present

## 2020-03-09 DIAGNOSIS — Z85828 Personal history of other malignant neoplasm of skin: Secondary | ICD-10-CM | POA: Diagnosis not present

## 2020-03-09 DIAGNOSIS — Z951 Presence of aortocoronary bypass graft: Secondary | ICD-10-CM | POA: Insufficient documentation

## 2020-03-09 DIAGNOSIS — Z96641 Presence of right artificial hip joint: Secondary | ICD-10-CM | POA: Diagnosis not present

## 2020-03-09 DIAGNOSIS — I2510 Atherosclerotic heart disease of native coronary artery without angina pectoris: Secondary | ICD-10-CM | POA: Diagnosis not present

## 2020-03-09 DIAGNOSIS — Z7901 Long term (current) use of anticoagulants: Secondary | ICD-10-CM | POA: Diagnosis not present

## 2020-03-09 DIAGNOSIS — I480 Paroxysmal atrial fibrillation: Secondary | ICD-10-CM | POA: Diagnosis not present

## 2020-03-09 DIAGNOSIS — I10 Essential (primary) hypertension: Secondary | ICD-10-CM | POA: Insufficient documentation

## 2020-03-09 DIAGNOSIS — I5020 Unspecified systolic (congestive) heart failure: Secondary | ICD-10-CM | POA: Diagnosis not present

## 2020-03-09 DIAGNOSIS — Z79899 Other long term (current) drug therapy: Secondary | ICD-10-CM | POA: Diagnosis not present

## 2020-03-09 DIAGNOSIS — N400 Enlarged prostate without lower urinary tract symptoms: Secondary | ICD-10-CM | POA: Insufficient documentation

## 2020-03-09 DIAGNOSIS — E785 Hyperlipidemia, unspecified: Secondary | ICD-10-CM | POA: Insufficient documentation

## 2020-03-09 HISTORY — PX: CARDIOVERSION: SHX1299

## 2020-03-09 LAB — CBC
HCT: 36.7 % — ABNORMAL LOW (ref 39.0–52.0)
Hemoglobin: 11.3 g/dL — ABNORMAL LOW (ref 13.0–17.0)
MCH: 30.5 pg (ref 26.0–34.0)
MCHC: 30.8 g/dL (ref 30.0–36.0)
MCV: 98.9 fL (ref 80.0–100.0)
Platelets: 142 10*3/uL — ABNORMAL LOW (ref 150–400)
RBC: 3.71 MIL/uL — ABNORMAL LOW (ref 4.22–5.81)
RDW: 13.8 % (ref 11.5–15.5)
WBC: 4.8 10*3/uL (ref 4.0–10.5)
nRBC: 0 % (ref 0.0–0.2)

## 2020-03-09 LAB — BASIC METABOLIC PANEL
Anion gap: 9 (ref 5–15)
BUN: 23 mg/dL (ref 8–23)
CO2: 25 mmol/L (ref 22–32)
Calcium: 8.9 mg/dL (ref 8.9–10.3)
Chloride: 107 mmol/L (ref 98–111)
Creatinine, Ser: 1.29 mg/dL — ABNORMAL HIGH (ref 0.61–1.24)
GFR calc non Af Amer: 53 mL/min — ABNORMAL LOW (ref >60)
Glucose, Bld: 90 mg/dL (ref 70–99)
Potassium: 4.2 mmol/L (ref 3.5–5.1)
Sodium: 141 mmol/L (ref 135–145)

## 2020-03-09 SURGERY — CARDIOVERSION
Anesthesia: General

## 2020-03-09 MED ORDER — LIDOCAINE 2% (20 MG/ML) 5 ML SYRINGE
INTRAMUSCULAR | Status: DC | PRN
  Administered 2020-03-09: 40 mg via INTRAVENOUS

## 2020-03-09 MED ORDER — PHENYLEPHRINE 40 MCG/ML (10ML) SYRINGE FOR IV PUSH (FOR BLOOD PRESSURE SUPPORT)
PREFILLED_SYRINGE | INTRAVENOUS | Status: DC | PRN
  Administered 2020-03-09: 80 ug via INTRAVENOUS

## 2020-03-09 MED ORDER — SODIUM CHLORIDE 0.9 % IV SOLN: INTRAVENOUS | Status: DC

## 2020-03-09 MED ORDER — PROPOFOL 10 MG/ML IV BOLUS
INTRAVENOUS | Status: DC | PRN
  Administered 2020-03-09: 50 mg via INTRAVENOUS

## 2020-03-09 NOTE — Anesthesia Procedure Notes (Addendum)
Procedure Name: General with mask airway Date/Time: 03/09/2020 9:51 AM Performed by: Reece Agar, CRNA Pre-anesthesia Checklist: Patient identified, Emergency Drugs available, Suction available, Patient being monitored and Timeout performed Patient Re-evaluated:Patient Re-evaluated prior to induction Oxygen Delivery Method: Ambu bag

## 2020-03-09 NOTE — Interval H&P Note (Signed)
History and Physical Interval Note:  03/09/2020 8:38 AM  Caleb Wolf  has presented today for surgery, with the diagnosis of A-FIB.  The various methods of treatment have been discussed with the patient and family. After consideration of risks, benefits and other options for treatment, the patient has consented to  Procedure(s): CARDIOVERSION (N/A) as a surgical intervention.  The patient's history has been reviewed, patient examined, no change in status, stable for surgery.  I have reviewed the patient's chart and labs.  Questions were answered to the patient's satisfaction.     Caleb Wolf

## 2020-03-09 NOTE — Discharge Instructions (Signed)
Chemical Cardioversion, Care After This sheet gives you information about how to care for yourself after your procedure. Your health care provider may also give you more specific instructions. If you have problems or questions, contact your health care provider. What can I expect after the procedure? After the procedure, it is common to have:  Tiredness (fatigue).  Mild throat discomfort, if you had a test to look for blood clots in your heart (transesophageal echocardiogram, TEE). Follow these instructions at home: Medicines  Take over-the-counter and prescription medicines only as told by your health care provider. You may need to take blood thinners (anticoagulants) or medicines to control your heart rhythm.  If you are taking blood thinners: ? Talk with your health care provider before you take any medicines that contain aspirin or NSAIDs, such as ibuprofen. These medicines increase your risk for dangerous bleeding. ? Take your medicine exactly as told, at the same time every day. ? Avoid activities that could cause injury or bruising, and follow instructions about how to prevent falls. ? Wear a medical alert bracelet or carry a card that lists what medicines you take. Eating and drinking   Follow instructions from your health care provider about eating and drinking restrictions. You may have to follow a low-salt (low-sodium), low-fat, and low-cholesterol diet.  Drink enough fluid to keep your urine pale yellow.  Do not drink alcohol if: ? Your health care provider tells you not to drink. ? You are pregnant, may be pregnant, or are planning to become pregnant.  If you drink alcohol: ? Limit how much you use to:  0-1 drink a day for women.  0-2 drinks a day for men. ? Be aware of how much alcohol is in your drink. In the [place] one drink equals one 12 oz bottle of beer (355 mL), one 5 oz glass of wine (148 mL), or one 1 oz glass of hard liquor (44 mL). Activity  Ask your  health care provider what activities are safe for you.  Do not drive for 24 hours if you were given a sedative during your procedure. General instructions  Do not use any products that contain nicotine or tobacco, such as cigarettes, e-cigarettes, and chewing tobacco. If you need help quitting, ask your health care provider.  Keep all follow-up visits as told by your health care provider. This is important. Contact a health care provider if you have:  A fever.  Severe pain, and medicines do not help.  Problems taking your medicines.  Irregular heartbeats. Get help right away if:   Your heartbeat becomes very fast or slow.  You have chest pain or shortness of breath.  You feel dizzy.  You faint.  You have any symptoms of a stroke. "BE FAST" is an easy way to remember the main warning signs of a stroke: ? B - Balance. Signs are dizziness, sudden trouble walking, or loss of balance. ? E - Eyes. Signs are trouble seeing or a sudden change in vision. ? F - Face. Signs are sudden weakness or numbness of the face, or the face or eyelid drooping on one side. ? A - Arms. Signs are weakness or numbness in an arm. This happens suddenly and usually on one side of the body. ? S - Speech. Signs are sudden trouble speaking, slurred speech, or trouble understanding what people say. ? T - Time. Time to call emergency services. Write down what time symptoms started.  You have other signs of a stroke, such  as: ? A sudden, severe headache with no known cause. ? Nausea or vomiting. ? Seizure. These symptoms may represent a serious problem that is an emergency. Do not wait to see if the symptoms will go away. Get medical help right away. Call your local emergency services (911 in the [place] Do not drive yourself to the hospital. Summary  Some fatigue is common after this procedure.  You may have to take blood thinners (anticoagulants) or medicines to control your heart rhythm.  If you have  symptoms of a stroke, get help right away. "BE FAST" is an easy way to remember the main warning signs of a stroke. This information is not intended to replace advice given to you by your health care provider. Make sure you discuss any questions you have with your health care provider. Document Revised: 12/10/2018 Document Reviewed: 12/10/2018 Elsevier Patient Education  [place].

## 2020-03-09 NOTE — CV Procedure (Signed)
Procedure:   DCCV  Indication:  Symptomatic atrial fibrillation  Procedure Note:  The patient signed informed consent.  They have had had therapeutic anticoagulation with rivaroxaban greater than 3 weeks.  Anesthesia was administered by Dr. Doroteo Glassman.  Patient received 40 mg IV lidocaine and 70 mg IV propofol.Adequate airway was maintained throughout and vital followed per protocol.  They were cardioverted x 1 with 150J of biphasic synchronized energy.  They converted to NSR.  There were no apparent complications.  The patient had normal neuro status and respiratory status post procedure with vitals stable as recorded elsewhere.    Follow up:  They will continue on current medical therapy and follow up with cardiology as scheduled.  Buford Dresser, MD PhD 03/09/2020 9:59 AM

## 2020-03-09 NOTE — Anesthesia Preprocedure Evaluation (Addendum)
Anesthesia Evaluation  Patient identified by MRN, date of birth, ID band Patient awake    Reviewed: Allergy & Precautions, NPO status , Patient's Chart, lab work & pertinent test results  Airway Mallampati: III  TM Distance: >3 FB Neck ROM: Full    Dental no notable dental hx. (+) Teeth Intact, Dental Advisory Given   Pulmonary sleep apnea and Continuous Positive Airway Pressure Ventilation , former smoker,  Quit smoking 1984, 37.5 pack years    Pulmonary exam normal breath sounds clear to auscultation       Cardiovascular hypertension, Pt. on medications + CAD, + CABG (CABG x 3 2015) and +CHF (grade 2 diastolic dysfunction, modertae pHTN)  Normal cardiovascular exam+ dysrhythmias (xarelto) Atrial Fibrillation  Rhythm:Regular Rate:Normal  Echo 2021: 1. Left ventricular ejection fraction, by estimation, is 50 to 55%. The  left ventricle has low normal function. The left ventricle has no regional  wall motion abnormalities. Left ventricular diastolic parameters are  consistent with Grade II diastolic  dysfunction (pseudonormalization). Elevated left atrial pressure.  2. Right ventricular systolic function is normal. The right ventricular  size is normal. There is moderately elevated pulmonary artery systolic  pressure.  3. Left atrial size was moderately dilated.  4. Right atrial size was moderately dilated.  5. The mitral valve is normal in structure. Trivial mitral valve  regurgitation. No evidence of mitral stenosis.  6. The aortic valve has an indeterminant number of cusps. Aortic valve  regurgitation is not visualized. No aortic stenosis is present.  7. Moderate pulmonary HTN, PASP is 48 mmHg.  8. The inferior vena cava is dilated in size with >50% respiratory  variability, suggesting right atrial pressure of 8 mmHg.    Neuro/Psych negative neurological ROS  negative psych ROS   GI/Hepatic Neg liver ROS, GERD   Controlled and Medicated,  Endo/Other  negative endocrine ROSBMI 33  Renal/GU negative Renal ROS  negative genitourinary   Musculoskeletal  (+) Arthritis , Osteoarthritis,    Abdominal (+) + obese,   Peds  Hematology negative hematology ROS (+)   Anesthesia Other Findings   Reproductive/Obstetrics negative OB ROS                            Anesthesia Physical Anesthesia Plan  ASA: III  Anesthesia Plan: General   Post-op Pain Management:    Induction: Intravenous  PONV Risk Score and Plan: Treatment may vary due to age or medical condition  Airway Management Planned: Mask and Natural Airway  Additional Equipment: None  Intra-op Plan:   Post-operative Plan:   Informed Consent: I have reviewed the patients History and Physical, chart, labs and discussed the procedure including the risks, benefits and alternatives for the proposed anesthesia with the patient or authorized representative who has indicated his/her understanding and acceptance.     Dental advisory given  Plan Discussed with: CRNA  Anesthesia Plan Comments:         Anesthesia Quick Evaluation

## 2020-03-09 NOTE — Transfer of Care (Signed)
Immediate Anesthesia Transfer of Care Note  Patient: Caleb Wolf  Procedure(s) Performed: CARDIOVERSION (N/A )  Patient Location: Endoscopy Unit  Anesthesia Type:General  Level of Consciousness: awake and alert   Airway & Oxygen Therapy: Patient Spontanous Breathing  Post-op Assessment: Report given to RN and Post -op Vital signs reviewed and stable  Post vital signs: Reviewed and stable  Last Vitals:  Vitals Value Taken Time  BP    Temp    Pulse    Resp    SpO2      Last Pain:  Vitals:   03/09/20 0800  TempSrc: Oral  PainSc: 0-No pain         Complications: No complications documented.

## 2020-03-09 NOTE — Anesthesia Postprocedure Evaluation (Signed)
Anesthesia Post Note  Patient: Caleb Wolf  Procedure(s) Performed: CARDIOVERSION (N/A )     Patient location during evaluation: PACU Anesthesia Type: General Level of consciousness: awake and alert, oriented and patient cooperative Pain management: pain level controlled Vital Signs Assessment: post-procedure vital signs reviewed and stable Respiratory status: spontaneous breathing, nonlabored ventilation and respiratory function stable Cardiovascular status: blood pressure returned to baseline and stable Postop Assessment: no apparent nausea or vomiting Anesthetic complications: no   No complications documented.  Last Vitals:  Vitals:   03/09/20 1014 03/09/20 1024  BP: (!) 125/53 110/61  Pulse: 72 72  Resp: 16 14  Temp:    SpO2: 95% 98%    Last Pain:  Vitals:   03/09/20 1024  TempSrc:   PainSc: 0-No pain                 Pervis Hocking

## 2020-03-11 ENCOUNTER — Encounter (HOSPITAL_COMMUNITY): Payer: Self-pay | Admitting: Cardiology

## 2020-03-12 ENCOUNTER — Telehealth: Payer: Self-pay | Admitting: Internal Medicine

## 2020-03-12 LAB — POCT I-STAT, CHEM 8
BUN: 15 mg/dL (ref 8–23)
Calcium, Ion: 0.87 mmol/L — CL (ref 1.15–1.40)
Chloride: 115 mmol/L — ABNORMAL HIGH (ref 98–111)
Creatinine, Ser: 0.8 mg/dL (ref 0.61–1.24)
Glucose, Bld: 61 mg/dL — ABNORMAL LOW (ref 70–99)
HCT: 20 % — ABNORMAL LOW (ref 39.0–52.0)
Hemoglobin: 6.8 g/dL — CL (ref 13.0–17.0)
Potassium: 2.6 mmol/L — CL (ref 3.5–5.1)
Sodium: 147 mmol/L — ABNORMAL HIGH (ref 135–145)
TCO2: 16 mmol/L — ABNORMAL LOW (ref 22–32)

## 2020-03-12 MED ORDER — RIVAROXABAN 20 MG PO TABS: 20.0000 mg | ORAL_TABLET | Freq: Every day | ORAL | 3 refills | Status: DC

## 2020-03-12 NOTE — Telephone Encounter (Signed)
° ° ° °  Pt c/o medication issue:  1. Name of Medication: rivaroxaban (XARELTO) 20 MG TABS tablet  2. How are you currently taking this medication (dosage and times per day)? Take 1 tablet (20 mg total) by mouth daily.  3. Are you having a reaction (difficulty breathing--STAT)?   4. What is your medication issue? Pt said she pick up his xarelto and was given 30 days. He said he needs to get 90 days supply because that might be cheaper to get. He wanted to get the 90 days instead

## 2020-03-12 NOTE — Telephone Encounter (Signed)
Left message that new prescriptions was sent to his pharmacy.

## 2020-03-16 ENCOUNTER — Encounter (HOSPITAL_COMMUNITY): Payer: Self-pay | Admitting: Nurse Practitioner

## 2020-03-16 ENCOUNTER — Ambulatory Visit (HOSPITAL_COMMUNITY)
Admission: RE | Admit: 2020-03-16 | Discharge: 2020-03-16 | Disposition: A | Discharge status: Home / Self Care | Payer: Medicare Other | Source: Ambulatory Visit | Attending: Nurse Practitioner | Admitting: Nurse Practitioner

## 2020-03-16 ENCOUNTER — Other Ambulatory Visit: Payer: Self-pay

## 2020-03-16 VITALS — BP 136/54 | HR 60 | Ht 71.0 in | Wt 237.8 lb

## 2020-03-16 DIAGNOSIS — E785 Hyperlipidemia, unspecified: Secondary | ICD-10-CM | POA: Diagnosis not present

## 2020-03-16 DIAGNOSIS — I2510 Atherosclerotic heart disease of native coronary artery without angina pectoris: Secondary | ICD-10-CM | POA: Diagnosis not present

## 2020-03-16 DIAGNOSIS — I10 Essential (primary) hypertension: Secondary | ICD-10-CM | POA: Insufficient documentation

## 2020-03-16 DIAGNOSIS — Z951 Presence of aortocoronary bypass graft: Secondary | ICD-10-CM | POA: Insufficient documentation

## 2020-03-16 DIAGNOSIS — Z79899 Other long term (current) drug therapy: Secondary | ICD-10-CM | POA: Insufficient documentation

## 2020-03-16 DIAGNOSIS — I4819 Other persistent atrial fibrillation: Secondary | ICD-10-CM | POA: Diagnosis not present

## 2020-03-16 DIAGNOSIS — K219 Gastro-esophageal reflux disease without esophagitis: Secondary | ICD-10-CM | POA: Diagnosis not present

## 2020-03-16 DIAGNOSIS — Z96641 Presence of right artificial hip joint: Secondary | ICD-10-CM | POA: Diagnosis not present

## 2020-03-16 DIAGNOSIS — Z87891 Personal history of nicotine dependence: Secondary | ICD-10-CM | POA: Insufficient documentation

## 2020-03-16 DIAGNOSIS — D6869 Other thrombophilia: Secondary | ICD-10-CM | POA: Diagnosis not present

## 2020-03-16 DIAGNOSIS — Z7901 Long term (current) use of anticoagulants: Secondary | ICD-10-CM | POA: Insufficient documentation

## 2020-03-16 DIAGNOSIS — Z85828 Personal history of other malignant neoplasm of skin: Secondary | ICD-10-CM | POA: Insufficient documentation

## 2020-03-16 NOTE — Progress Notes (Signed)
Primary Care Physician: Asencion Noble, MD Referring Physician: [name] is a 79 y.o. male with a h/o afib, s/p ablation 2019. He had a cardioversion and recently on f/u with [name]  he was in afib again. He set him up for cardioversion, which was successful and he remains in [place] today. He continues on  Today, he denies symptoms of palpitations, chest pain, shortness of breath, orthopnea, PND, lower extremity edema, dizziness, presyncope, syncope, or neurologic sequela. The patient is tolerating medications without difficulties and is otherwise without complaint today.   Past Medical History:  Diagnosis Date  . Arthritis   . Basal cell carcinoma   . Carotid stenosis   . Coronary artery disease    Multvessel s/p CABG 2015  . Enlarged prostate   . Essential hypertension   . GERD (gastroesophageal reflux disease)   . Gout   . History of colon polyps   . History of kidney stones   . Hyperlipidemia   . Lumbar disc disease   . Persistent atrial fibrillation ([place])   . Sleep apnea    CPAP   Past Surgical History:  Procedure Laterality Date  . ABLATION OF DYSRHYTHMIC FOCUS  07/28/2017  . ATRIAL FIBRILLATION ABLATION N/A 07/28/2017   Procedure: ATRIAL FIBRILLATION ABLATION;  Surgeon: Thompson Grayer, MD;  Location: [place] CV LAB;  Service: Cardiovascular;  Laterality: N/A;  . BACK SURGERY    . CARDIAC CATHETERIZATION  05/22/2014   Procedure: IABP INSERTION;  Surgeon: Leonie Man, MD;  Location[facility] CATH LAB;  Service: Cardiovascular;;  . CARDIOVERSION N/A 04/29/2017   Procedure: CARDIOVERSION;  Surgeon: Satira Sark, MD;  Location: [facility] SU[facility];  Service: Cardiovascular;  Laterality: N/A;  . CARDIOVERSION N/A 08/13/2017   Procedure: CARDIOVERSION;  Surgeon: Pixie Casino, MD;  Location: [facility] ENDOSCOPY;  Service: Cardiovascular;  Laterality: N/A;  . CARDIOVERSION N/A 08/15/2019   Procedure: CARDIOVERSION;  Surgeon: Dorothy Spark, MD;  Location: [facility]  ENDOSCOPY;  Service: Cardiovascular;  Laterality: N/A;  . CARDIOVERSION N/A 03/09/2020   Procedure: CARDIOVERSION;  Surgeon: Buford Dresser, MD;  Location: [facility] ENDOSCOPY;  Service: Cardiovascular;  Laterality: N/A;  . Cataract surgery Right   . COLONOSCOPY    . COLONOSCOPY N/A 12/30/2017   Procedure: COLONOSCOPY;  Surgeon: Rogene Houston, MD;  Location: [facility];  Service: Endoscopy;  Laterality: N/A;  . CORONARY ARTERY BYPASS GRAFT N/A 05/22/2014   Procedure: CORONARY ARTERY BYPASS GRAFTING (CABG) times three using left internal mammary and right saphenous vein.;  Surgeon: Melrose Nakayama, MD;  Location: [place];  Service: Open Heart Surgery;  Laterality: N/A;  . ENDARTERECTOMY Left 02/17/2014   Procedure: ENDARTERECTOMY CAROTID WITH PATCH ANGIOPLASTY;  Surgeon: Mal Misty, MD;  Location: [place];  Service: Vascular;  Laterality: Left;  . ESOPHAGEAL DILATION N/A 08/22/2015   Procedure: ESOPHAGEAL DILATION;  Surgeon: Rogene Houston, MD;  [facility];  Service: Endoscopy;  Laterality: N/A;  . ESOPHAGOGASTRODUODENOSCOPY N/A 08/22/2015   Procedure: ESOPHAGOGASTRODUODENOSCOPY (EGD);  Surgeon: Rogene Houston, MD;  [facility];  Service: Endoscopy;  Laterality: N/A;  12:45 - moved to 1:55 - Ann notified pt  . ESOPHAGOGASTRODUODENOSCOPY N/A 12/30/2017   Procedure: ESOPHAGOGASTRODUODENOSCOPY (EGD);  Surgeon: Rogene Houston, MD;  [facility];  Service: Endoscopy;  Laterality: N/A;  200  . EYE SURGERY     cataract extraction (right) with repair macular tear , with IOL  right  . FRACTURE SURGERY     bilateral wrist fractures- one ORIF  . JOINT REPLACEMENT  2011   left knee  . LEFT HEART CATHETERIZATION WITH CORONARY ANGIOGRAM N/A 05/22/2014   Procedure: LEFT HEART CATHETERIZATION WITH CORONARY ANGIOGRAM;  Surgeon: Leonie Man, MD;  Location: [facility] CATH LAB;  Service: Cardiovascular;  Laterality: N/A;  . LITHOTRIPSY    . PARS PLANA VITRECTOMY W/  REPAIR OF MACULAR HOLE    . RHINOPLASTY    . TEE WITHOUT CARDIOVERSION N/A 04/29/2017   Procedure: TRANSESOPHAGEAL ECHOCARDIOGRAM (TEE) WITH PROPOFOL;  Surgeon: Satira Sark, MD;  Location: [facility];  Service: Cardiovascular;  Laterality: N/A;  . TONSILLECTOMY    . TOTAL HIP ARTHROPLASTY  12/08/2011   Procedure: TOTAL HIP ARTHROPLASTY;  Surgeon: Gearlean Alf, MD;  Location: [facility] ORS;  Service: Orthopedics;  Laterality: Right;  . UPPER GI ENDOSCOPY  12/18/2015   Procedure: UPPER GI ENDOSCOPY;  Surgeon: Ralene Ok, MD;  Location: [facility] ORS;  Service: General;;  . WRIST SURGERY Right 65yrs ago  . WRIST SURGERY Left     Current Outpatient Medications  Medication Sig Dispense Refill  . acetaminophen (TYLENOL) 500 MG tablet Take 1,000 mg by mouth as needed for moderate pain.     Marland Kitchen amoxicillin (AMOXIL) 500 MG capsule TAKE 4 CAPSULES BY MOUTH 1 HOUR PRIOR APPT    . atorvastatin (LIPITOR) 80 MG tablet Take 1 tablet (80 mg total) by mouth every evening. 90 tablet 3  . colchicine 0.6 MG tablet Take 0.6 mg by mouth daily as needed (for gout flare up).     . Cyanocobalamin (B-12) 1000 MCG SUBL Place 1,000 mcg under the tongue daily.     . cyclobenzaprine (FLEXERIL) 10 MG tablet Take 10 mg by mouth 2 (two) times daily as needed for muscle spasms.    Marland Kitchen diltiazem (CARDIZEM) 30 MG tablet TAKE 1 TABLET BY MOUTH 4 (FOUR) TIMES DAILY AS NEEDED (FOR PALPITATIONS/ELEVATED HEART RATES). 30 tablet 4  . doxazosin (CARDURA) 4 MG tablet Take 4 mg by mouth daily.  4  . ELDERBERRY PO Take 1-2 tablets by mouth daily as needed (IMMUNE SUPPORT).    . finasteride (PROSCAR) 5 MG tablet Take 5 mg by mouth every evening.     . furosemide (LASIX) 40 MG tablet TAKE 1 TABLET BY MOUTH TWICE A DAY (Patient taking differently: Take 40 mg by mouth daily. ) 180 tablet 0  . lisinopril (PRINIVIL,ZESTRIL) 40 MG tablet Take 40 mg by mouth daily.    . nitroGLYCERIN (NITROSTAT) 0.4 MG SL tablet Take as directed (Patient taking  differently: Place 0.4 mg under the tongue every 5 (five) minutes x 3 doses as needed for chest pain. ) 25 tablet 3  . pantoprazole (PROTONIX) 40 MG tablet Take 1 tablet (40 mg total) by mouth daily before breakfast. (Patient taking differently: Take 40 mg by mouth at bedtime. ) 90 tablet 3  . rivaroxaban (XARELTO) 20 MG TABS tablet Take 1 tablet (20 mg total) by mouth daily with supper. 90 tablet 3   No current facility-administered medications for this encounter.    Allergies  Allergen Reactions  . Codeine Sulfate Nausea Only    Social History   Socioeconomic History  . Marital status: Married    Spouse name: Not on file  . Number of children: Not on file  . Years of education: Not on file  . Highest education level: Not on file  Occupational History  . Occupation: retired  Comment: [employer]  Tobacco Use  . Smoking status: Former Smoker    Packs/day: 1.50    Years: 25.00    Pack years: 37.50    Types: Cigarettes    Start date: 08/08/1960    Quit date: 06/02/1982    Years since quitting: 37.8  . Smokeless tobacco: Never Used  . Tobacco comment: quit smoking 30+yrs ago  Vaping Use  . Vaping Use: Never used  Substance and Sexual Activity  . Alcohol use: No    Alcohol/week: 0.0 standard drinks    Comment: occasionally   . Drug use: No  . Sexual activity: Yes  Other Topics Concern  . Not on file  Social History Narrative  . Not on file   Social Determinants of Health   Financial Resource Strain:   . Difficulty of Paying Living Expenses: Not on file  Food Insecurity:   . Worried About Charity fundraiser in the Last Year: Not on file  . Ran Out of Food in the Last Year: Not on file  Transportation Needs:   . Lack of Transportation (Medical): Not on file  . Lack of Transportation (Non-Medical): Not on file  Physical Activity:   . Days of Exercise per Week: Not on file  . Minutes of Exercise per Session: Not on file  Stress:   . Feeling of Stress :  Not on file  Social Connections:   . Frequency of Communication with Friends and Family: Not on file  . Frequency of Social Gatherings with Friends and Family: Not on file  . Attends Religious Services: Not on file  . Active Member of Clubs or Organizations: Not on file  . Attends Archivist Meetings: Not on file  . Marital Status: Not on file  Intimate Partner Violence:   . Fear of Current or Ex-Partner: Not on file  . Emotionally Abused: Not on file  . Physically Abused: Not on file  . Sexually Abused: Not on file    Family History  Problem Relation Age of Onset  . Allergies Mother   . Heart disease Mother   . Hypertension Mother   . Heart disease Father        MI    ROS- All systems are reviewed and negative except as per the HPI above  Physical Exam: Vitals:   03/16/20 1112  BP: (!) 136/54  Pulse: 60  Weight: 107.9 kg  Height: 5\' 11"  (1.803 m)   Wt Readings from Last 3 Encounters:  03/16/20 107.9 kg  03/05/20 106.6 kg  12/07/19 105.2 kg    Labs: Lab Results  Component Value Date   NA 141 03/09/2020   K 4.2 03/09/2020   CL 107 03/09/2020   CO2 25 03/09/2020   GLUCOSE 90 03/09/2020   BUN 23 03/09/2020   CREATININE 1.29 (H) 03/09/2020   CALCIUM 8.9 03/09/2020   PHOS 3.3 04/29/2017   MG 1.9 04/30/2017   Lab Results  Component Value Date   INR 2.2 (H) 08/15/2019   Lab Results  Component Value Date   CHOL 134 03/22/2015   HDL 41 03/22/2015   LDLCALC 75 03/22/2015   TRIG 92 03/22/2015     GEN- The patient is well appearing, alert and oriented x 3 today.   Head- normocephalic, atraumatic Eyes-  Sclera clear, conjunctiva pink Ears- hearing intact Oropharynx- clear Neck- supple, no JVP Lymph- no cervical lymphadenopathy Lungs- Clear to ausculation bilaterally, normal work of breathing Heart- Regular rate and rhythm, no  murmurs, rubs or gallops, PMI not laterally displaced GI- soft, NT, ND, + BS Extremities- no clubbing, cyanosis, or  edema MS- no significant deformity or atrophy Skin- no rash or lesion Psych- euthymic mood, full affect Neuro- strength and sensation are intact  EKG-NSR at 60 bpm, pr int 178 ms, qrs int 82 ms, qtc 440ms    Assessment and Plan: 1. Afib  Successful  cardioversion in  March 2021 and again 03/09/20 He is in SR today  He feels well  Reviewed with pt how to take 30 mg Cardizem as needed  Per Dr. Jackalyn Lombard note, if ERAF, he should be considered for repeat ablation   2. CHA2DS2VASc score of 6 Continue  xarelto 20 mg daily   F/u with [name] 06/04/20  Geroge Baseman. Rada Zegers, [facility] [address] [place], [place] [address] [phone]

## 2020-04-06 ENCOUNTER — Other Ambulatory Visit: Payer: Self-pay

## 2020-04-06 DIAGNOSIS — I6529 Occlusion and stenosis of unspecified carotid artery: Secondary | ICD-10-CM

## 2020-04-12 DIAGNOSIS — Z23 Encounter for immunization: Secondary | ICD-10-CM | POA: Diagnosis not present

## 2020-04-16 ENCOUNTER — Ambulatory Visit (HOSPITAL_COMMUNITY)
Admission: RE | Admit: 2020-04-16 | Discharge: 2020-04-16 | Disposition: A | Discharge status: Home / Self Care | Payer: Medicare Other | Source: Ambulatory Visit | Attending: Physician Assistant | Admitting: Physician Assistant

## 2020-04-16 ENCOUNTER — Other Ambulatory Visit: Payer: Self-pay

## 2020-04-16 ENCOUNTER — Ambulatory Visit (INDEPENDENT_AMBULATORY_CARE_PROVIDER_SITE_OTHER): Payer: Medicare Other | Admitting: Physician Assistant

## 2020-04-16 VITALS — BP 147/61 | HR 78 | Temp 98.3°F | Resp 20 | Ht 71.0 in | Wt 234.6 lb

## 2020-04-16 DIAGNOSIS — I6522 Occlusion and stenosis of left carotid artery: Secondary | ICD-10-CM

## 2020-04-16 DIAGNOSIS — M7989 Other specified soft tissue disorders: Secondary | ICD-10-CM

## 2020-04-16 DIAGNOSIS — I6521 Occlusion and stenosis of right carotid artery: Secondary | ICD-10-CM

## 2020-04-16 DIAGNOSIS — I6529 Occlusion and stenosis of unspecified carotid artery: Secondary | ICD-10-CM | POA: Diagnosis not present

## 2020-04-16 NOTE — Progress Notes (Signed)
Office Note     CC:  follow up Requesting Provider:  Asencion Noble, MD  HPI: ZALMAN HULL is a 79 y.o. (22-Jul-1940) male who presents for routine follow up of carotid disease. He is s/p left carotid endarterectomy on 02/17/14 by Dr. Kellie Simmering for asymptomatic stenosis. He reports no new neurologic symptoms since his last visit. He denies any amaurosis fugax, monocular blindness, facial drooping, slurred speech, weakness or numbness of upper or lower extremities.  He continues to have left lower extremity swelling. He wears 30-40 mmHg compression stockings daily and also elevates. He denies any lower extremity pain or claudication.  The pt is on a statin for cholesterol management.  The pt is not on a daily aspirin.   Other AC: Xarelto The pt is on CCB, Diuretic, ACE for hypertension.   The pt is not diabetic.  Tobacco hx: Former, quit 1984  Past Medical History:  Diagnosis Date  . Arthritis   . Basal cell carcinoma   . Carotid stenosis   . Coronary artery disease    Multvessel s/p CABG 2015  . Enlarged prostate   . Essential hypertension   . GERD (gastroesophageal reflux disease)   . Gout   . History of colon polyps   . History of kidney stones   . Hyperlipidemia   . Lumbar disc disease   . Persistent atrial fibrillation ([place])   . Sleep apnea    CPAP    Past Surgical History:  Procedure Laterality Date  . ABLATION OF DYSRHYTHMIC FOCUS  07/28/2017  . ATRIAL FIBRILLATION ABLATION N/A 07/28/2017   Procedure: ATRIAL FIBRILLATION ABLATION;  Surgeon: Thompson Grayer, MD;  Location: [place] [facility] LAB;  Service: Cardiovascular;  Laterality: N/A;  . BACK SURGERY    . CARDIAC CATHETERIZATION  05/22/2014   Procedure: IABP INSERTION;  Surgeon: Leonie Man, MD;  Location: [facility] CATH LAB;  Service: Cardiovascular;;  . CARDIOVERSION N/A 04/29/2017   Procedure: CARDIOVERSION;  Surgeon: Satira Sark, MD;  [facility];  Service: Cardiovascular;  Laterality: N/A;  .  CARDIOVERSION N/A 08/13/2017   Procedure: CARDIOVERSION;  Surgeon: Pixie Casino, MD;  Location: [facility] ENDOSCOPY;  Service: Cardiovascular;  Laterality: N/A;  . CARDIOVERSION N/A 08/15/2019   Procedure: CARDIOVERSION;  Surgeon: Dorothy Spark, MD;  Location: [facility] ENDOSCOPY;  Service: Cardiovascular;  Laterality: N/A;  . CARDIOVERSION N/A 03/09/2020   Procedure: CARDIOVERSION;  Surgeon: Buford Dresser, MD;  Location: [facility] ENDOSCOPY;  Service: Cardiovascular;  Laterality: N/A;  . Cataract surgery Right   . COLONOSCOPY    . COLONOSCOPY N/A 12/30/2017   Procedure: COLONOSCOPY;  Surgeon: Rogene Houston, MD;  [facility];  Service: Endoscopy;  Laterality: N/A;  . CORONARY ARTERY BYPASS GRAFT N/A 05/22/2014   Procedure: CORONARY ARTERY BYPASS GRAFTING (CABG) times three using left internal mammary and right saphenous vein.;  Surgeon: Melrose Nakayama, MD;  Location: [place];  Service: Open Heart Surgery;  Laterality: N/A;  . ENDARTERECTOMY Left 02/17/2014   Procedure: ENDARTERECTOMY CAROTID WITH PATCH ANGIOPLASTY;  Surgeon: Mal Misty, MD;  Location: [place];  Service: Vascular;  Laterality: Left;  . ESOPHAGEAL DILATION N/A 08/22/2015   Procedure: ESOPHAGEAL DILATION;  Surgeon: Rogene Houston, MD;  [facility];  Service: Endoscopy;  Laterality: N/A;  . ESOPHAGOGASTRODUODENOSCOPY N/A 08/22/2015   Procedure: ESOPHAGOGASTRODUODENOSCOPY (EGD);  Surgeon: Rogene Houston, MD;  [facility];  Service: Endoscopy;  Laterality: N/A;  12:45 - moved to 1:55 - Ann notified  pt  . ESOPHAGOGASTRODUODENOSCOPY N/A 12/30/2017   Procedure: ESOPHAGOGASTRODUODENOSCOPY (EGD);  Surgeon: Rogene Houston, MD;  [facility];  Service: Endoscopy;  Laterality: N/A;  200  . EYE SURGERY     cataract extraction (right) with repair macular tear , with IOL     right  . FRACTURE SURGERY     bilateral wrist fractures- one ORIF  . JOINT REPLACEMENT  2011   left knee  . LEFT HEART  CATHETERIZATION WITH CORONARY ANGIOGRAM N/A 05/22/2014   Procedure: LEFT HEART CATHETERIZATION WITH CORONARY ANGIOGRAM;  Surgeon: Leonie Man, MD;  Location: [facility] CATH LAB;  Service: Cardiovascular;  Laterality: N/A;  . LITHOTRIPSY    . PARS PLANA VITRECTOMY W/ REPAIR OF MACULAR HOLE    . RHINOPLASTY    . TEE WITHOUT CARDIOVERSION N/A 04/29/2017   Procedure: TRANSESOPHAGEAL ECHOCARDIOGRAM (TEE) WITH PROPOFOL;  Surgeon: Satira Sark, MD;  [facility];  Service: Cardiovascular;  Laterality: N/A;  . TONSILLECTOMY    . TOTAL HIP ARTHROPLASTY  12/08/2011   Procedure: TOTAL HIP ARTHROPLASTY;  Surgeon: Gearlean Alf, MD;  [facility]: [facility];  Service: Orthopedics;  Laterality: Right;  . UPPER GI ENDOSCOPY  12/18/2015   Procedure: UPPER GI ENDOSCOPY;  Surgeon: Ralene Ok, MD;  Location: [facility];  Service: General;;  . WRIST SURGERY Right 15yrs ago  . WRIST SURGERY Left     Social History   Socioeconomic History  . Marital status: Married    Spouse name: Not on file  . Number of children: Not on file  . Years of education: Not on file  . Highest education level: Not on file  Occupational History  . Occupation: retired    Comment: [place] [employer]  Tobacco Use  . Smoking status: Former Smoker    Packs/day: 1.50    Years: 25.00    Pack years: 37.50    Types: Cigarettes    Start date: 08/08/1960    Quit date: 06/02/1982    Years since quitting: 37.8  . Smokeless tobacco: Never Used  . Tobacco comment: quit smoking 30+yrs ago  Vaping Use  . Vaping Use: Never used  Substance and Sexual Activity  . Alcohol use: No    Alcohol/week: 0.0 standard drinks    Comment: occasionally   . Drug use: No  . Sexual activity: Yes  Other Topics Concern  . Not on file  Social History Narrative  . Not on file   Social Determinants of Health   Financial Resource Strain:   . Difficulty of Paying Living Expenses: Not on file  Food Insecurity:   . Worried About Ship broker in the Last Year: Not on file  . Ran Out of Food in the Last Year: Not on file  Transportation Needs:   . Lack of Transportation (Medical): Not on file  . Lack of Transportation (Non-Medical): Not on file  Physical Activity:   . Days of Exercise per Week: Not on file  . Minutes of Exercise per Session: Not on file  Stress:   . Feeling of Stress : Not on file  Social Connections:   . Frequency of Communication with Friends and Family: Not on file  . Frequency of Social Gatherings with Friends and Family: Not on file  . Attends Religious Services: Not on file  . Active Member of Clubs or Organizations: Not on file  . Attends Archivist Meetings: Not on file  . Marital Status: Not on file  Intimate Partner Violence:   . Fear of Current or Ex-Partner: Not on file  . Emotionally Abused: Not on file  . Physically Abused: Not on file  . Sexually Abused: Not on file    Family History  Problem Relation Age of Onset  . Allergies Mother   . Heart disease Mother   . Hypertension Mother   . Heart disease Father        MI    Current Outpatient Medications  Medication Sig Dispense Refill  . acetaminophen (TYLENOL) 500 MG tablet Take 1,000 mg by mouth as needed for moderate pain.     Marland Kitchen amoxicillin (AMOXIL) 500 MG capsule TAKE 4 CAPSULES BY MOUTH 1 HOUR PRIOR APPT    . atorvastatin (LIPITOR) 80 MG tablet Take 1 tablet (80 mg total) by mouth every evening. 90 tablet 3  . colchicine 0.6 MG tablet Take 0.6 mg by mouth daily as needed (for gout flare up).     . Cyanocobalamin (B-12) 1000 MCG SUBL Place 1,000 mcg under the tongue daily.     . cyclobenzaprine (FLEXERIL) 10 MG tablet Take 10 mg by mouth 2 (two) times daily as needed for muscle spasms.    . diazepam (VALIUM) 5 MG tablet Valium 5 mg tablet  Take 1-2 tablet(s) 30 MINUTES PRIOR TO MRI  by oral route.    . diltiazem (CARDIZEM) 30 MG tablet TAKE 1 TABLET BY MOUTH 4 (FOUR) TIMES DAILY AS NEEDED (FOR  PALPITATIONS/ELEVATED HEART RATES). 30 tablet 4  . doxazosin (CARDURA) 4 MG tablet Take 4 mg by mouth daily.  4  . ELDERBERRY PO Take 1-2 tablets by mouth daily as needed (IMMUNE SUPPORT).    . finasteride (PROSCAR) 5 MG tablet Take 5 mg by mouth every evening.     . furosemide (LASIX) 40 MG tablet TAKE 1 TABLET BY MOUTH TWICE A DAY (Patient taking differently: Take 40 mg by mouth daily. ) 180 tablet 0  . lisinopril (PRINIVIL,ZESTRIL) 40 MG tablet Take 40 mg by mouth daily.    . nitroGLYCERIN (NITROSTAT) 0.4 MG SL tablet Take as directed (Patient taking differently: Place 0.4 mg under the tongue every 5 (five) minutes x 3 doses as needed for chest pain. ) 25 tablet 3  . pantoprazole (PROTONIX) 40 MG tablet Take 1 tablet (40 mg total) by mouth daily before breakfast. (Patient taking differently: Take 40 mg by mouth at bedtime. ) 90 tablet 3  . rivaroxaban (XARELTO) 20 MG TABS tablet Take 1 tablet (20 mg total) by mouth daily with supper. 90 tablet 3   No current facility-administered medications for this visit.    Allergies  Allergen Reactions  . Codeine Sulfate Nausea Only     REVIEW OF SYSTEMS:  [X]  denotes positive finding, [ ]  denotes negative finding Cardiac  Comments:  Chest pain or chest pressure:    Shortness of breath upon exertion:    Short of breath when lying flat:    Irregular heart rhythm:        Vascular    Pain in calf, thigh, or hip brought on by ambulation:    Pain in feet at night that wakes you up from your sleep:     Blood clot in your veins:    Leg swelling:         Pulmonary    Oxygen at home:    Productive cough:     Wheezing:         Neurologic    Sudden weakness in arms  or legs:     Sudden numbness in arms or legs:     Sudden onset of difficulty speaking or slurred speech:    Temporary loss of vision in one eye:     Problems with dizziness:         Gastrointestinal    Blood in stool:     Vomited blood:         Genitourinary    Burning when  urinating:     Blood in urine:        Psychiatric    Major depression:         Hematologic    Bleeding problems:    Problems with blood clotting too easily:        Skin    Rashes or ulcers:        Constitutional    Fever or chills:      PHYSICAL EXAMINATION:  Vitals:   04/16/20 1231 04/16/20 1236  BP: (!) 156/67 (!) 147/61  Pulse: 78   Resp: 20   Temp: 98.3 F (36.8 C)   TempSrc: Temporal   SpO2: 100%   Weight: 234 lb 9.6 oz (106.4 kg)   Height: 5\' 11"  (1.803 m)     General:  WDWN in NAD; vital signs documented above Gait: Normal HENT: WNL, normocephalic Pulmonary: normal non-labored breathing , without wheezing Cardiac: irregular HR, without  Murmurs without carotid bruit Abdomen: soft, NT, no masses Vascular Exam/Pulses: 2+ radial pulses, 2+ Dp and PT pulse bilaterally.  Bilateral lower extremities warm and well perfused. Left lower extremity swelling Musculoskeletal: no muscle wasting or atrophy  Neurologic: A&O X 3;  No focal weakness or paresthesias are detected Psychiatric:  The pt has Normal affect.   Non-Invasive Vascular Imaging:   Right Carotid: Evidence consistent with a total occlusion of the right ICA. The CCA appears occluded.   Left Carotid: Velocities in the left ICA are consistent with a 1-39% stenosis.   Vertebrals: Bilateral vertebral arteries demonstrate antegrade flow.  Subclavians: Normal flow hemodynamics were seen in bilateral subclavian arteries.    ASSESSMENT/PLAN:: 79 y.o. male here for follow up for carotid disease. He is s/p left CEA in 2015 by Dr. Kellie Simmering for asymptomatic carotid stenosis. He has had known right ICA occlusion. His duplex today shows stable left ICA stenosis of 1-39% and right ICA remains occluded. He remains without symptoms - he will continue his compression stocking use for lower extremity swelling - He remains on Statin and Xarelto - Discussed signs and symptoms of TIA/ Stroke and advised him should these occur  he should present to the ER - He will follow up in 1 year with repeat carotid duplex   Karoline Caldwell, PA-C [facility] [phone]  Clinic MD:   Dr. Trula Slade

## 2020-05-11 ENCOUNTER — Other Ambulatory Visit: Payer: Self-pay

## 2020-05-30 ENCOUNTER — Telehealth: Payer: Self-pay | Admitting: *Deleted

## 2020-05-30 NOTE — Telephone Encounter (Signed)
Got a clearance request, couldn't make out what it was saying. Tried to call office and never got a person on the phone. Will fax back to the office.

## 2020-06-04 ENCOUNTER — Ambulatory Visit (INDEPENDENT_AMBULATORY_CARE_PROVIDER_SITE_OTHER): Payer: Medicare Other | Admitting: Internal Medicine

## 2020-06-04 ENCOUNTER — Other Ambulatory Visit: Payer: Self-pay

## 2020-06-04 ENCOUNTER — Encounter: Payer: Self-pay | Admitting: Internal Medicine

## 2020-06-04 VITALS — BP 148/62 | HR 60 | Ht 71.0 in | Wt 234.0 lb

## 2020-06-04 DIAGNOSIS — I10 Essential (primary) hypertension: Secondary | ICD-10-CM

## 2020-06-04 DIAGNOSIS — G4733 Obstructive sleep apnea (adult) (pediatric): Secondary | ICD-10-CM | POA: Diagnosis not present

## 2020-06-04 DIAGNOSIS — I25708 Atherosclerosis of coronary artery bypass graft(s), unspecified, with other forms of angina pectoris: Secondary | ICD-10-CM | POA: Diagnosis not present

## 2020-06-04 DIAGNOSIS — I4819 Other persistent atrial fibrillation: Secondary | ICD-10-CM | POA: Diagnosis not present

## 2020-06-04 MED ORDER — NITROGLYCERIN 0.4 MG SL SUBL: 0.4000 mg | SUBLINGUAL_TABLET | SUBLINGUAL | 3 refills | Status: DC | PRN

## 2020-06-04 NOTE — Progress Notes (Signed)
PCP: Asencion Noble, MD Primary Cardiologist: Dr Bronson Ing previously Primary EP: Dr Jethro Bolus is a 80 y.o. male who presents today for routine electrophysiology followup.  Since last being seen in our clinic, the patient reports doing very well.  Today, he denies symptoms of palpitations, chest pain, shortness of breath,  lower extremity edema, dizziness, presyncope, or syncope.  The patient is otherwise without complaint today.   Past Medical History:  Diagnosis Date  . Arthritis   . Basal cell carcinoma   . Carotid stenosis   . Coronary artery disease    Multvessel s/p CABG 2015  . Enlarged prostate   . Essential hypertension   . GERD (gastroesophageal reflux disease)   . Gout   . History of colon polyps   . History of kidney stones   . Hyperlipidemia   . Lumbar disc disease   . Persistent atrial fibrillation ([place])   . Sleep apnea    CPAP   Past Surgical History:  Procedure Laterality Date  . ABLATION OF DYSRHYTHMIC FOCUS  07/28/2017  . ATRIAL FIBRILLATION ABLATION N/A 07/28/2017   Procedure: ATRIAL FIBRILLATION ABLATION;  Surgeon: Thompson Grayer, MD;  Location: [place] CV LAB;  Service: Cardiovascular;  Laterality: N/A;  . BACK SURGERY    . CARDIAC CATHETERIZATION  05/22/2014   Procedure: IABP INSERTION;  Surgeon: Leonie Man, MD;  Location: [facility] CATH LAB;  Service: Cardiovascular;;  . CARDIOVERSION N/A 04/29/2017   Procedure: CARDIOVERSION;  Surgeon: Satira Sark, MD;  [facility];  Service: Cardiovascular;  Laterality: N/A;  . CARDIOVERSION N/A 08/13/2017   Procedure: CARDIOVERSION;  Surgeon: Pixie Casino, MD;  Location: [facility] ENDOSCOPY;  Service: Cardiovascular;  Laterality: N/A;  . CARDIOVERSION N/A 08/15/2019   Procedure: CARDIOVERSION;  Surgeon: Dorothy Spark, MD;  Location: [facility] ENDOSCOPY;  Service: Cardiovascular;  Laterality: N/A;  . CARDIOVERSION N/A 03/09/2020   Procedure: CARDIOVERSION;  Surgeon: Buford Dresser, MD;   Location: [facility] ENDOSCOPY;  Service: Cardiovascular;  Laterality: N/A;  . Cataract surgery Right   . COLONOSCOPY    . COLONOSCOPY N/A 12/30/2017   Procedure: COLONOSCOPY;  Surgeon: Rogene Houston, MD;  [facility];  Service: Endoscopy;  Laterality: N/A;  . CORONARY ARTERY BYPASS GRAFT N/A 05/22/2014   Procedure: CORONARY ARTERY BYPASS GRAFTING (CABG) times three using left internal mammary and right saphenous vein.;  Surgeon: Melrose Nakayama, MD;  Location: [place];  Service: Open Heart Surgery;  Laterality: N/A;  . ENDARTERECTOMY Left 02/17/2014   Procedure: ENDARTERECTOMY CAROTID WITH PATCH ANGIOPLASTY;  Surgeon: Mal Misty, MD;  Location: [place];  Service: Vascular;  Laterality: Left;  . ESOPHAGEAL DILATION N/A 08/22/2015   Procedure: ESOPHAGEAL DILATION;  Surgeon: Rogene Houston, MD;  [facility];  Service: Endoscopy;  Laterality: N/A;  . ESOPHAGOGASTRODUODENOSCOPY N/A 08/22/2015   Procedure: ESOPHAGOGASTRODUODENOSCOPY (EGD);  Surgeon: Rogene Houston, MD;  [facility];  Service: Endoscopy;  Laterality: N/A;  12:45 - moved to 1:55 - Ann notified pt  . ESOPHAGOGASTRODUODENOSCOPY N/A 12/30/2017   Procedure: ESOPHAGOGASTRODUODENOSCOPY (EGD);  Surgeon: Rogene Houston, MD;  [facility];  Service: Endoscopy;  Laterality: N/A;  200  . EYE SURGERY     cataract extraction (right) with repair macular tear , with IOL     right  . FRACTURE SURGERY     bilateral wrist fractures- one ORIF  . JOINT REPLACEMENT  2011   left knee  . LEFT HEART  CATHETERIZATION WITH CORONARY ANGIOGRAM N/A 05/22/2014   Procedure: LEFT HEART CATHETERIZATION WITH CORONARY ANGIOGRAM;  Surgeon: Leonie Man, MD;  Location: [facility] CATH LAB;  Service: Cardiovascular;  Laterality: N/A;  . LITHOTRIPSY    . PARS PLANA VITRECTOMY W/ REPAIR OF MACULAR HOLE    . RHINOPLASTY    . TEE WITHOUT CARDIOVERSION N/A 04/29/2017   Procedure: TRANSESOPHAGEAL ECHOCARDIOGRAM (TEE) WITH PROPOFOL;   Surgeon: Satira Sark, MD;  [facility];  Service: Cardiovascular;  Laterality: N/A;  . TONSILLECTOMY    . TOTAL HIP ARTHROPLASTY  12/08/2011   Procedure: TOTAL HIP ARTHROPLASTY;  Surgeon: Gearlean Alf, MD;  [facility];  Service: Orthopedics;  Laterality: Right;  . UPPER GI ENDOSCOPY  12/18/2015   Procedure: UPPER GI ENDOSCOPY;  Surgeon: Ralene Ok, MD;  Location: [facility];  Service: General;;  . WRIST SURGERY Right 33yrs ago  . WRIST SURGERY Left     ROS- all systems are reviewed and negatives except as per HPI above  Current Outpatient Medications  Medication Sig Dispense Refill  . acetaminophen (TYLENOL) 500 MG tablet Take 1,000 mg by mouth as needed for moderate pain.     Marland Kitchen amoxicillin (AMOXIL) 500 MG capsule TAKE 4 CAPSULES BY MOUTH 1 HOUR PRIOR APPT    . atorvastatin (LIPITOR) 80 MG tablet Take 1 tablet (80 mg total) by mouth every evening. 90 tablet 3  . colchicine 0.6 MG tablet Take 0.6 mg by mouth daily as needed (for gout flare up).     . Cyanocobalamin (B-12) 1000 MCG SUBL Place 1,000 mcg under the tongue daily.     . cyclobenzaprine (FLEXERIL) 10 MG tablet Take 10 mg by mouth 2 (two) times daily as needed for muscle spasms.    . diazepam (VALIUM) 5 MG tablet Valium 5 mg tablet  Take 1-2 tablet(s) 30 MINUTES PRIOR TO MRI  by oral route.    . diltiazem (CARDIZEM) 30 MG tablet TAKE 1 TABLET BY MOUTH 4 (FOUR) TIMES DAILY AS NEEDED (FOR PALPITATIONS/ELEVATED HEART RATES). 30 tablet 4  . doxazosin (CARDURA) 4 MG tablet Take 4 mg by mouth daily.  4  . ELDERBERRY PO Take 1-2 tablets by mouth daily as needed (IMMUNE SUPPORT).    . finasteride (PROSCAR) 5 MG tablet Take 5 mg by mouth every evening.     . furosemide (LASIX) 40 MG tablet TAKE 1 TABLET BY MOUTH TWICE A DAY (Patient taking differently: Take 40 mg by mouth daily.) 180 tablet 0  . lisinopril (PRINIVIL,ZESTRIL) 40 MG tablet Take 40 mg by mouth daily.    . nitroGLYCERIN (NITROSTAT) 0.4 MG SL tablet  Take as directed (Patient taking differently: Place 0.4 mg under the tongue every 5 (five) minutes x 3 doses as needed for chest pain.) 25 tablet 3  . pantoprazole (PROTONIX) 40 MG tablet Take 1 tablet (40 mg total) by mouth daily before breakfast. (Patient taking differently: Take 40 mg by mouth at bedtime.) 90 tablet 3  . rivaroxaban (XARELTO) 20 MG TABS tablet Take 1 tablet (20 mg total) by mouth daily with supper. 90 tablet 3   No current facility-administered medications for this visit.    Physical Exam: Vitals:   06/04/20 1000  BP: (!) 148/62  Pulse: 60  SpO2: 98%  Weight: 234 lb (106.1 kg)  Height: 5\' 11"  (1.803 m)    GEN- The patient is well appearing, alert and oriented x 3 today.   Head- normocephalic, atraumatic Eyes-  Sclera clear, conjunctiva pink Ears-  hearing intact Oropharynx- clear Lungs- Clear to ausculation bilaterally, normal work of breathing Heart- Regular rate and rhythm, no murmurs, rubs or gallops, PMI not laterally displaced GI- soft, NT, ND, + BS Extremities- no clubbing, cyanosis, or edema  Wt Readings from Last 3 Encounters:  06/04/20 234 lb (106.1 kg)  04/16/20 234 lb 9.6 oz (106.4 kg)  03/16/20 237 lb 12.8 oz (107.9 kg)    EKG tracing ordered today is personally reviewed and shows sinus  Assessment and Plan:  1. Persistent afib Previously well controlled post ablation off AAD therapy.  Cardioverted in October. chads2vasc score is 6.  He is on xarelto We discussed Watchman as an option today.  He is considering this.  2. CAD s/p CABG No ischemic symptoms  3. HTN Stable No change required today  4. OSA Uses CPAP  Risks, benefits and potential toxicities for medications prescribed and/or refilled reviewed with patient today.   Return to AF clinic in 6 months I will see in a year  Hillis Range MD, [facility] 06/04/2020 10:54 AM

## 2020-06-04 NOTE — Patient Instructions (Addendum)
Medication Instructions:  Your physician recommends that you continue on your current medications as directed. Please refer to the Current Medication list given to you today.  *If you need a refill on your cardiac medications before your next appointment, please call your pharmacy*  Lab Work: None ordered.  If you have labs (blood work) drawn today and your tests are completely normal, you will receive your results only by: Marland Kitchen MyChart Message (if you have MyChart) OR . A paper copy in the mail If you have any lab test that is abnormal or we need to change your treatment, we will call you to review the results.  Testing/Procedures: None ordered.  Follow-Up: At [facility], you and your health needs are our priority.  As part of our continuing mission to provide you with exceptional heart care, we have created designated Provider Care Teams.  These Care Teams include your primary Cardiologist (physician) and Advanced Practice Providers (APPs -  Physician Assistants and Nurse Practitioners) who all work together to provide you with the care you need, when you need it.  We recommend signing up for the patient portal called "MyChart".  Sign up information is provided on this After Visit Summary.  MyChart is used to connect with patients for Virtual Visits (Telemedicine).  Patients are able to view lab/test results, encounter notes, upcoming appointments, etc.  Non-urgent messages can be sent to your provider as well.   To learn more about what you can do with MyChart, go to [email or web address].    Your next appointment:   Your physician wants you to follow-up in: 6 months with the Afib clinic.  1 year with Dr. Johney Frame. You will receive a reminder letter in the mail two months in advance. If you don't receive a letter, please call our office to schedule the follow-up appointment.   Other Instructions:

## 2020-06-14 ENCOUNTER — Other Ambulatory Visit: Payer: Self-pay | Admitting: Student

## 2020-06-14 MED ORDER — FUROSEMIDE 40 MG PO TABS: 40.0000 mg | ORAL_TABLET | Freq: Two times a day (BID) | ORAL | 0 refills | Status: DC

## 2020-06-15 ENCOUNTER — Telehealth: Payer: Self-pay | Admitting: Internal Medicine

## 2020-06-15 ENCOUNTER — Encounter: Payer: Self-pay | Admitting: Surgery

## 2020-06-15 NOTE — Telephone Encounter (Signed)
Called and spoke with patient.  He is aware of appt 06/19/2020 at 3 pm with Roderic Palau, NP.

## 2020-06-15 NOTE — Telephone Encounter (Addendum)
The patient thinks the steroid shot he got in his back 06/05/20 may have caused him to go into afib. He went in afib 06/07/20. He has not messed any doses of Xarelto. Patient states he has been using the PRN Diltiazem, 1 tablet in the morning and 1 tablet at night, to try and get him out of afib. Advised to take Diltiazem q 6 hours (4 times daily)  which is the PRN prescribed, avoid stressors, caffeine and alcohol. Only symptom at this time is fatigue.  Advised I would get back with him with anymore recommendations from [name] or Roderic Palau in the [facility].   Gave ED precautions   Patient agreeable and verbalized understanding.

## 2020-06-15 NOTE — Telephone Encounter (Signed)
Patient c/o Palpitations:  High priority if patient c/o lightheadedness, shortness of breath, or chest pain  1) How long have you had palpitations/irregular HR/ Afib? Are you having the symptoms now?  Patient states he has been in afib for the past few days and he is currently in afib, per his smart watch.        2) Are you currently experiencing lightheadedness, SOB or CP? No   3) Do you have a history of afib (atrial fibrillation) or irregular heart rhythm? Yes   4) Have you checked your BP or HR? (document readings if available):  BP: 120/81 HR: 110       BP: 132/77 HR: 113       BP: 131/64 HR: 97       BP: 113/63 HR: 100       BP: 151/72 HR: 100 5) Are you experiencing any other symptoms? No

## 2020-06-15 NOTE — Telephone Encounter (Signed)
Error

## 2020-06-19 ENCOUNTER — Other Ambulatory Visit: Payer: Self-pay

## 2020-06-19 ENCOUNTER — Encounter (HOSPITAL_COMMUNITY): Payer: Self-pay | Admitting: Nurse Practitioner

## 2020-06-19 ENCOUNTER — Ambulatory Visit (HOSPITAL_COMMUNITY)
Admission: RE | Admit: 2020-06-19 | Discharge: 2020-06-19 | Disposition: A | Discharge status: Home / Self Care | Payer: Medicare Other | Source: Ambulatory Visit | Attending: Nurse Practitioner | Admitting: Nurse Practitioner

## 2020-06-19 VITALS — BP 124/78 | HR 115 | Ht 71.0 in | Wt 230.0 lb

## 2020-06-19 DIAGNOSIS — Z7901 Long term (current) use of anticoagulants: Secondary | ICD-10-CM | POA: Insufficient documentation

## 2020-06-19 DIAGNOSIS — Z79899 Other long term (current) drug therapy: Secondary | ICD-10-CM | POA: Diagnosis not present

## 2020-06-19 DIAGNOSIS — I4819 Other persistent atrial fibrillation: Secondary | ICD-10-CM | POA: Insufficient documentation

## 2020-06-19 DIAGNOSIS — M961 Postlaminectomy syndrome, not elsewhere classified: Secondary | ICD-10-CM | POA: Diagnosis not present

## 2020-06-19 DIAGNOSIS — Z96641 Presence of right artificial hip joint: Secondary | ICD-10-CM | POA: Diagnosis not present

## 2020-06-19 DIAGNOSIS — D6869 Other thrombophilia: Secondary | ICD-10-CM | POA: Diagnosis not present

## 2020-06-19 DIAGNOSIS — Z01818 Encounter for other preprocedural examination: Secondary | ICD-10-CM | POA: Diagnosis not present

## 2020-06-19 LAB — CBC
HCT: 38.1 % — ABNORMAL LOW (ref 39.0–52.0)
Hemoglobin: 11.7 g/dL — ABNORMAL LOW (ref 13.0–17.0)
MCH: 30.5 pg (ref 26.0–34.0)
MCHC: 30.7 g/dL (ref 30.0–36.0)
MCV: 99.5 fL (ref 80.0–100.0)
Platelets: 148 10*3/uL — ABNORMAL LOW (ref 150–400)
RBC: 3.83 MIL/uL — ABNORMAL LOW (ref 4.22–5.81)
RDW: 13.9 % (ref 11.5–15.5)
WBC: 5.4 10*3/uL (ref 4.0–10.5)
nRBC: 0 % (ref 0.0–0.2)

## 2020-06-19 LAB — BASIC METABOLIC PANEL
Anion gap: 10 (ref 5–15)
BUN: 22 mg/dL (ref 8–23)
CO2: 23 mmol/L (ref 22–32)
Calcium: 8.8 mg/dL — ABNORMAL LOW (ref 8.9–10.3)
Chloride: 105 mmol/L (ref 98–111)
Creatinine, Ser: 1.36 mg/dL — ABNORMAL HIGH (ref 0.61–1.24)
GFR, Estimated: 54 mL/min — ABNORMAL LOW (ref >60)
Glucose, Bld: 90 mg/dL (ref 70–99)
Potassium: 5.2 mmol/L — ABNORMAL HIGH (ref 3.5–5.1)
Sodium: 138 mmol/L (ref 135–145)

## 2020-06-19 NOTE — H&P (View-Only) (Signed)
Primary Care Physician: Caleb Noble, MD Referring Physician: [name] is a 80 y.o. male with a h/o afib, s/p ablation 2019. He had a cardioversion in October  and recently on f/u with [name] 06/04/20 he was in [place].  He had a back injection the next day and restarted blood thinner 1/4. He started back in afib on 1/6. He is taking cardizem 30 mg every 4 hours with reasonable rate control.   Today, he denies symptoms of palpitations, chest pain, shortness of breath, orthopnea, PND, lower extremity edema, dizziness, presyncope, syncope, or neurologic sequela. The patient is tolerating medications without difficulties and is otherwise without complaint today.   Past Medical History:  Diagnosis Date  . Arthritis   . Basal cell carcinoma   . Carotid stenosis   . Coronary artery disease    Multvessel s/p CABG 2015  . Enlarged prostate   . Essential hypertension   . GERD (gastroesophageal reflux disease)   . Gout   . History of colon polyps   . History of kidney stones   . Hyperlipidemia   . Lumbar disc disease   . Persistent atrial fibrillation ([place])   . Sleep apnea    CPAP   Past Surgical History:  Procedure Laterality Date  . ABLATION OF DYSRHYTHMIC FOCUS  07/28/2017  . ATRIAL FIBRILLATION ABLATION N/A 07/28/2017   Procedure: ATRIAL FIBRILLATION ABLATION;  Surgeon: Thompson Grayer, MD;  Location: [facility] CV LAB;  Service: Cardiovascular;  Laterality: N/A;  . BACK SURGERY    . CARDIAC CATHETERIZATION  05/22/2014   Procedure: IABP INSERTION;  Surgeon: Leonie Man, MD;  Location: [facility] CATH LAB;  Service: Cardiovascular;;  . CARDIOVERSION N/A 04/29/2017   Procedure: CARDIOVERSION;  Surgeon: Satira Sark, MD;  [facility];  Service: Cardiovascular;  Laterality: N/A;  . CARDIOVERSION N/A 08/13/2017   Procedure: CARDIOVERSION;  Surgeon: Pixie Casino, MD;  Location: [facility] ENDOSCOPY;  Service: Cardiovascular;  Laterality: N/A;  . CARDIOVERSION N/A  08/15/2019   Procedure: CARDIOVERSION;  Surgeon: Dorothy Spark, MD;  Location: [facility] ENDOSCOPY;  Service: Cardiovascular;  Laterality: N/A;  . CARDIOVERSION N/A 03/09/2020   Procedure: CARDIOVERSION;  Surgeon: Buford Dresser, MD;  Location: [facility] ENDOSCOPY;  Service: Cardiovascular;  Laterality: N/A;  . Cataract surgery Right   . COLONOSCOPY    . COLONOSCOPY N/A 12/30/2017   Procedure: COLONOSCOPY;  Surgeon: Rogene Houston, MD;  [facility];  Service: Endoscopy;  Laterality: N/A;  . CORONARY ARTERY BYPASS GRAFT N/A 05/22/2014   Procedure: CORONARY ARTERY BYPASS GRAFTING (CABG) times three using left internal mammary and right saphenous vein.;  Surgeon: Melrose Nakayama, MD;  Location: [place];  Service: Open Heart Surgery;  Laterality: N/A;  . ENDARTERECTOMY Left 02/17/2014   Procedure: ENDARTERECTOMY CAROTID WITH PATCH ANGIOPLASTY;  Surgeon: Mal Misty, MD;  Location: [place];  Service: Vascular;  Laterality: Left;  . ESOPHAGEAL DILATION N/A 08/22/2015   Procedure: ESOPHAGEAL DILATION;  Surgeon: Rogene Houston, MD;  [facility];  Service: Endoscopy;  Laterality: N/A;  . ESOPHAGOGASTRODUODENOSCOPY N/A 08/22/2015   Procedure: ESOPHAGOGASTRODUODENOSCOPY (EGD);  Surgeon: Rogene Houston, MD;  [facility];  Service: Endoscopy;  Laterality: N/A;  12:45 - moved to 1:55 - Ann notified pt  . ESOPHAGOGASTRODUODENOSCOPY N/A 12/30/2017   Procedure: ESOPHAGOGASTRODUODENOSCOPY (EGD);  Surgeon: Rogene Houston, MD;  [facility];  Service: Endoscopy;  Laterality: N/A;  200  . EYE  SURGERY     cataract extraction (right) with repair macular tear , with IOL     right  . FRACTURE SURGERY     bilateral wrist fractures- one ORIF  . JOINT REPLACEMENT  2011   left knee  . LEFT HEART CATHETERIZATION WITH CORONARY ANGIOGRAM N/A 05/22/2014   Procedure: LEFT HEART CATHETERIZATION WITH CORONARY ANGIOGRAM;  Surgeon: Leonie Man, MD;  Location: [facility] CATH LAB;   Service: Cardiovascular;  Laterality: N/A;  . LITHOTRIPSY    . PARS PLANA VITRECTOMY W/ REPAIR OF MACULAR HOLE    . RHINOPLASTY    . TEE WITHOUT CARDIOVERSION N/A 04/29/2017   Procedure: TRANSESOPHAGEAL ECHOCARDIOGRAM (TEE) WITH PROPOFOL;  Surgeon: Satira Sark, MD;  [facility];  Service: Cardiovascular;  Laterality: N/A;  . TONSILLECTOMY    . TOTAL HIP ARTHROPLASTY  12/08/2011   Procedure: TOTAL HIP ARTHROPLASTY;  Surgeon: Gearlean Alf, MD;  Location: [facility] ORS;  Service: Orthopedics;  Laterality: Right;  . UPPER GI ENDOSCOPY  12/18/2015   Procedure: UPPER GI ENDOSCOPY;  Surgeon: Ralene Ok, MD;  Location: [facility] ORS;  Service: General;;  . WRIST SURGERY Right 40yrs ago  . WRIST SURGERY Left     Current Outpatient Medications  Medication Sig Dispense Refill  . acetaminophen (TYLENOL) 500 MG tablet Take 1,000 mg by mouth as needed for moderate pain.     Marland Kitchen amoxicillin (AMOXIL) 500 MG capsule TAKE 4 CAPSULES BY MOUTH 1 HOUR PRIOR APPT    . atorvastatin (LIPITOR) 80 MG tablet Take 1 tablet (80 mg total) by mouth every evening. 90 tablet 3  . colchicine 0.6 MG tablet Take 0.6 mg by mouth daily as needed (for gout flare up).     . Cyanocobalamin (B-12) 1000 MCG SUBL Place 1,000 mcg under the tongue daily.     . cyclobenzaprine (FLEXERIL) 10 MG tablet Take 10 mg by mouth 2 (two) times daily as needed for muscle spasms.    . diazepam (VALIUM) 5 MG tablet Valium 5 mg tablet  Take 1-2 tablet(s) 30 MINUTES PRIOR TO MRI  by oral route.    . diltiazem (CARDIZEM) 30 MG tablet TAKE 1 TABLET BY MOUTH 4 (FOUR) TIMES DAILY AS NEEDED (FOR PALPITATIONS/ELEVATED HEART RATES). 30 tablet 4  . doxazosin (CARDURA) 4 MG tablet Take 4 mg by mouth daily.  4  . ELDERBERRY PO Take 1-2 tablets by mouth daily as needed (IMMUNE SUPPORT).    . finasteride (PROSCAR) 5 MG tablet Take 5 mg by mouth every evening.     . furosemide (LASIX) 40 MG tablet Take 1 tablet (40 mg total) by mouth 2 (two) times  daily. 180 tablet 0  . lisinopril (PRINIVIL,ZESTRIL) 40 MG tablet Take 40 mg by mouth daily.    . nitroGLYCERIN (NITROSTAT) 0.4 MG SL tablet Place 1 tablet (0.4 mg total) under the tongue every 5 (five) minutes x 3 doses as needed for chest pain. 75 tablet 3  . pantoprazole (PROTONIX) 40 MG tablet Take 1 tablet (40 mg total) by mouth daily before breakfast. 90 tablet 3  . rivaroxaban (XARELTO) 20 MG TABS tablet Take 1 tablet (20 mg total) by mouth daily with supper. 90 tablet 3   No current facility-administered medications for this encounter.    Allergies  Allergen Reactions  . Codeine Sulfate Nausea Only    Social History   Socioeconomic History  . Marital status: Married    Spouse name: Not on file  . Number of children: Not on file  .  Years of education: Not on file  . Highest education level: Not on file  Occupational History  . Occupation: retired    Comment: [place] [employer]  Tobacco Use  . Smoking status: Former Smoker    Packs/day: 1.50    Years: 25.00    Pack years: 37.50    Types: Cigarettes    Start date: 08/08/1960    Quit date: 06/02/1982    Years since quitting: 38.0  . Smokeless tobacco: Never Used  . Tobacco comment: quit smoking 30+yrs ago  Vaping Use  . Vaping Use: Never used  Substance and Sexual Activity  . Alcohol use: No    Alcohol/week: 0.0 standard drinks    Comment: occasionally   . Drug use: No  . Sexual activity: Yes  Other Topics Concern  . Not on file  Social History Narrative  . Not on file   Social Determinants of Health   Financial Resource Strain: Not on file  Food Insecurity: Not on file  Transportation Needs: Not on file  Physical Activity: Not on file  Stress: Not on file  Social Connections: Not on file  Intimate Partner Violence: Not on file    Family History  Problem Relation Age of Onset  . Allergies Mother   . Heart disease Mother   . Hypertension Mother   . Heart disease Father        MI    ROS- All  systems are reviewed and negative except as per the HPI above  Physical Exam: Vitals:   06/19/20 1448  BP: 124/78  Pulse: (!) 115  Weight: 104.3 kg  Height: 5\' 11"  (1.803 m)   Wt Readings from Last 3 Encounters:  06/19/20 104.3 kg  06/04/20 106.1 kg  04/16/20 106.4 kg    Labs: Lab Results  Component Value Date   NA 141 03/09/2020   K 4.2 03/09/2020   CL 107 03/09/2020   CO2 25 03/09/2020   GLUCOSE 90 03/09/2020   BUN 23 03/09/2020   CREATININE 1.29 (H) 03/09/2020   CALCIUM 8.9 03/09/2020   PHOS 3.3 04/29/2017   MG 1.9 04/30/2017   Lab Results  Component Value Date   INR 2.2 (H) 08/15/2019   Lab Results  Component Value Date   CHOL 134 03/22/2015   HDL 41 03/22/2015   LDLCALC 75 03/22/2015   TRIG 92 03/22/2015     GEN- The patient is well appearing, alert and oriented x 3 today.   Head- normocephalic, atraumatic Eyes-  Sclera clear, conjunctiva pink Ears- hearing intact Oropharynx- clear Neck- supple, no JVP Lymph- no cervical lymphadenopathy Lungs- Clear to ausculation bilaterally, normal work of breathing Heart- irregular rate and rhythm, no murmurs, rubs or gallops, PMI not laterally displaced GI- soft, NT, ND, + BS Extremities- no clubbing, cyanosis, or edema MS- no significant deformity or atrophy Skin- no rash or lesion Psych- euthymic mood, full affect Neuro- strength and sensation are intact  EKG- afib at 115 bpm, qrs int 88 ms, qtc 398 ms     Assessment and Plan: 1. Afib  Successful  cardioversion in  March 2021 and again 03/09/20 Recent  steroid back injection may have triggered afib Continue  30 mg cardizem every 4 hours for rate control  Will set up for cardioversion  Per Dr. Jackalyn Lombard note, if ERAF, he should be considered for repeat ablation Cbc/bmet/covid   2. CHA2DS2VASc score of 6 Continue  xarelto 20 mg daily  He missed doses x 2 days for back injection  and has been back on since 1/4 Eligible for DCCV after 1/25  F/u with  afib clinic one week after cardioversion   Caleb Wolf, [facility] [address] [place], [place] [address] [phone]

## 2020-06-19 NOTE — Patient Instructions (Signed)
Cardioversion scheduled for Wednesday, January 26th  - Arrive at the [facility] and go to admitting at [place] not eat or drink anything after midnight the night prior to your procedure.  - Take all your morning medication (except diabetic medications) with a sip of water prior to arrival.  - You will not be able to drive home after your procedure.  - Do NOT miss any doses of your blood thinner - if you should miss a dose please notify our office immediately.  - If you feel as if you go back into normal rhythm prior to scheduled cardioversion, please notify our office immediately. If your procedure is canceled in the cardioversion suite you will be charged a cancellation fee.

## 2020-06-19 NOTE — Progress Notes (Signed)
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 male with a h/o afib, s/p ablation 2019. He had a cardioversion in October  and recently on f/u with [name] 06/04/20 he was in [place].  He had a back injection the next day and restarted blood thinner 1/4. He started back in afib on 1/6. He is taking cardizem 30 mg every 4 hours with reasonable rate control.   Today, he denies symptoms of palpitations, chest pain, shortness of breath, orthopnea, PND, lower extremity edema, dizziness, presyncope, syncope, or neurologic sequela. The patient is tolerating medications without difficulties and is otherwise without complaint today.   Past Medical History:  Diagnosis Date  . Arthritis   . Basal cell carcinoma   . Carotid stenosis   . Coronary artery disease    Multvessel s/p CABG 2015  . Enlarged prostate   . Essential hypertension   . GERD (gastroesophageal reflux disease)   . Gout   . History of colon polyps   . History of kidney stones   . Hyperlipidemia   . Lumbar disc disease   . Persistent atrial fibrillation ([place])   . Sleep apnea    CPAP   Past Surgical History:  Procedure Laterality Date  . ABLATION OF DYSRHYTHMIC FOCUS  07/28/2017  . ATRIAL FIBRILLATION ABLATION N/A 07/28/2017   Procedure: ATRIAL FIBRILLATION ABLATION;  Surgeon: Thompson Grayer, MD;  Location: [facility] CV LAB;  Service: Cardiovascular;  Laterality: N/A;  . BACK SURGERY    . CARDIAC CATHETERIZATION  05/22/2014   Procedure: IABP INSERTION;  Surgeon: Leonie Man, MD;  Location: [facility] CATH LAB;  Service: Cardiovascular;;  . CARDIOVERSION N/A 04/29/2017   Procedure: CARDIOVERSION;  Surgeon: Satira Sark, MD;  [facility];  Service: Cardiovascular;  Laterality: N/A;  . CARDIOVERSION N/A 08/13/2017   Procedure: CARDIOVERSION;  Surgeon: Pixie Casino, MD;  Location: [facility] ENDOSCOPY;  Service: Cardiovascular;  Laterality: N/A;  . CARDIOVERSION N/A  08/15/2019   Procedure: CARDIOVERSION;  Surgeon: Dorothy Spark, MD;  Location: [facility] ENDOSCOPY;  Service: Cardiovascular;  Laterality: N/A;  . CARDIOVERSION N/A 03/09/2020   Procedure: CARDIOVERSION;  Surgeon: Buford Dresser, MD;  Location: [facility] ENDOSCOPY;  Service: Cardiovascular;  Laterality: N/A;  . Cataract surgery Right   . COLONOSCOPY    . COLONOSCOPY N/A 12/30/2017   Procedure: COLONOSCOPY;  Surgeon: Rogene Houston, MD;  [facility];  Service: Endoscopy;  Laterality: N/A;  . CORONARY ARTERY BYPASS GRAFT N/A 05/22/2014   Procedure: CORONARY ARTERY BYPASS GRAFTING (CABG) times three using left internal mammary and right saphenous vein.;  Surgeon: Melrose Nakayama, MD;  Location: [place];  Service: Open Heart Surgery;  Laterality: N/A;  . ENDARTERECTOMY Left 02/17/2014   Procedure: ENDARTERECTOMY CAROTID WITH PATCH ANGIOPLASTY;  Surgeon: Mal Misty, MD;  Location: [place];  Service: Vascular;  Laterality: Left;  . ESOPHAGEAL DILATION N/A 08/22/2015   Procedure: ESOPHAGEAL DILATION;  Surgeon: Rogene Houston, MD;  [facility];  Service: Endoscopy;  Laterality: N/A;  . ESOPHAGOGASTRODUODENOSCOPY N/A 08/22/2015   Procedure: ESOPHAGOGASTRODUODENOSCOPY (EGD);  Surgeon: Rogene Houston, MD;  [facility];  Service: Endoscopy;  Laterality: N/A;  12:45 - moved to 1:55 - Ann notified pt  . ESOPHAGOGASTRODUODENOSCOPY N/A 12/30/2017   Procedure: ESOPHAGOGASTRODUODENOSCOPY (EGD);  Surgeon: Rogene Houston, MD;  [facility];  Service: Endoscopy;  Laterality: N/A;  200  . EYE  SURGERY     cataract extraction (right) with repair macular tear , with IOL     right  . FRACTURE SURGERY     bilateral wrist fractures- one ORIF  . JOINT REPLACEMENT  2011   left knee  . LEFT HEART CATHETERIZATION WITH CORONARY ANGIOGRAM N/A 05/22/2014   Procedure: LEFT HEART CATHETERIZATION WITH CORONARY ANGIOGRAM;  Surgeon: Leonie Man, MD;  Location: [facility] CATH LAB;   Service: Cardiovascular;  Laterality: N/A;  . LITHOTRIPSY    . PARS PLANA VITRECTOMY W/ REPAIR OF MACULAR HOLE    . RHINOPLASTY    . TEE WITHOUT CARDIOVERSION N/A 04/29/2017   Procedure: TRANSESOPHAGEAL ECHOCARDIOGRAM (TEE) WITH PROPOFOL;  Surgeon: Satira Sark, MD;  [facility];  Service: Cardiovascular;  Laterality: N/A;  . TONSILLECTOMY    . TOTAL HIP ARTHROPLASTY  12/08/2011   Procedure: TOTAL HIP ARTHROPLASTY;  Surgeon: Gearlean Alf, MD;  Location: [facility] ORS;  Service: Orthopedics;  Laterality: Right;  . UPPER GI ENDOSCOPY  12/18/2015   Procedure: UPPER GI ENDOSCOPY;  Surgeon: Ralene Ok, MD;  Location: [facility] ORS;  Service: General;;  . WRIST SURGERY Right 24yrs ago  . WRIST SURGERY Left     Current Outpatient Medications  Medication Sig Dispense Refill  . acetaminophen (TYLENOL) 500 MG tablet Take 1,000 mg by mouth as needed for moderate pain.     Marland Kitchen amoxicillin (AMOXIL) 500 MG capsule TAKE 4 CAPSULES BY MOUTH 1 HOUR PRIOR APPT    . atorvastatin (LIPITOR) 80 MG tablet Take 1 tablet (80 mg total) by mouth every evening. 90 tablet 3  . colchicine 0.6 MG tablet Take 0.6 mg by mouth daily as needed (for gout flare up).     . Cyanocobalamin (B-12) 1000 MCG SUBL Place 1,000 mcg under the tongue daily.     . cyclobenzaprine (FLEXERIL) 10 MG tablet Take 10 mg by mouth 2 (two) times daily as needed for muscle spasms.    . diazepam (VALIUM) 5 MG tablet Valium 5 mg tablet  Take 1-2 tablet(s) 30 MINUTES PRIOR TO MRI  by oral route.    . diltiazem (CARDIZEM) 30 MG tablet TAKE 1 TABLET BY MOUTH 4 (FOUR) TIMES DAILY AS NEEDED (FOR PALPITATIONS/ELEVATED HEART RATES). 30 tablet 4  . doxazosin (CARDURA) 4 MG tablet Take 4 mg by mouth daily.  4  . ELDERBERRY PO Take 1-2 tablets by mouth daily as needed (IMMUNE SUPPORT).    . finasteride (PROSCAR) 5 MG tablet Take 5 mg by mouth every evening.     . furosemide (LASIX) 40 MG tablet Take 1 tablet (40 mg total) by mouth 2 (two) times  daily. 180 tablet 0  . lisinopril (PRINIVIL,ZESTRIL) 40 MG tablet Take 40 mg by mouth daily.    . nitroGLYCERIN (NITROSTAT) 0.4 MG SL tablet Place 1 tablet (0.4 mg total) under the tongue every 5 (five) minutes x 3 doses as needed for chest pain. 75 tablet 3  . pantoprazole (PROTONIX) 40 MG tablet Take 1 tablet (40 mg total) by mouth daily before breakfast. 90 tablet 3  . rivaroxaban (XARELTO) 20 MG TABS tablet Take 1 tablet (20 mg total) by mouth daily with supper. 90 tablet 3   No current facility-administered medications for this encounter.    Allergies  Allergen Reactions  . Codeine Sulfate Nausea Only    Social History   Socioeconomic History  . Marital status: Married    Spouse name: Not on file  . Number of children: Not on file  .  Years of education: Not on file  . Highest education level: Not on file  Occupational History  . Occupation: retired    Comment: [place] [employer]  Tobacco Use  . Smoking status: Former Smoker    Packs/day: 1.50    Years: 25.00    Pack years: 37.50    Types: Cigarettes    Start date: 08/08/1960    Quit date: 06/02/1982    Years since quitting: 38.0  . Smokeless tobacco: Never Used  . Tobacco comment: quit smoking 30+yrs ago  Vaping Use  . Vaping Use: Never used  Substance and Sexual Activity  . Alcohol use: No    Alcohol/week: 0.0 standard drinks    Comment: occasionally   . Drug use: No  . Sexual activity: Yes  Other Topics Concern  . Not on file  Social History Narrative  . Not on file   Social Determinants of Health   Financial Resource Strain: Not on file  Food Insecurity: Not on file  Transportation Needs: Not on file  Physical Activity: Not on file  Stress: Not on file  Social Connections: Not on file  Intimate Partner Violence: Not on file    Family History  Problem Relation Age of Onset  . Allergies Mother   . Heart disease Mother   . Hypertension Mother   . Heart disease Father        MI    ROS- All  systems are reviewed and negative except as per the HPI above  Physical Exam: Vitals:   06/19/20 1448  BP: 124/78  Pulse: (!) 115  Weight: 104.3 kg  Height: 5\' 11"  (1.803 m)   Wt Readings from Last 3 Encounters:  06/19/20 104.3 kg  06/04/20 106.1 kg  04/16/20 106.4 kg    Labs: Lab Results  Component Value Date   NA 141 03/09/2020   K 4.2 03/09/2020   CL 107 03/09/2020   CO2 25 03/09/2020   GLUCOSE 90 03/09/2020   BUN 23 03/09/2020   CREATININE 1.29 (H) 03/09/2020   CALCIUM 8.9 03/09/2020   PHOS 3.3 04/29/2017   MG 1.9 04/30/2017   Lab Results  Component Value Date   INR 2.2 (H) 08/15/2019   Lab Results  Component Value Date   CHOL 134 03/22/2015   HDL 41 03/22/2015   LDLCALC 75 03/22/2015   TRIG 92 03/22/2015     GEN- The patient is well appearing, alert and oriented x 3 today.   Head- normocephalic, atraumatic Eyes-  Sclera clear, conjunctiva pink Ears- hearing intact Oropharynx- clear Neck- supple, no JVP Lymph- no cervical lymphadenopathy Lungs- Clear to ausculation bilaterally, normal work of breathing Heart- irregular rate and rhythm, no murmurs, rubs or gallops, PMI not laterally displaced GI- soft, NT, ND, + BS Extremities- no clubbing, cyanosis, or edema MS- no significant deformity or atrophy Skin- no rash or lesion Psych- euthymic mood, full affect Neuro- strength and sensation are intact  EKG- afib at 115 bpm, qrs int 88 ms, qtc 398 ms     Assessment and Plan: 1. Afib  Successful  cardioversion in  March 2021 and again 03/09/20 Recent  steroid back injection may have triggered afib Continue  30 mg cardizem every 4 hours for rate control  Will set up for cardioversion  Per Dr. Jackalyn Lombard note, if ERAF, he should be considered for repeat ablation Cbc/bmet/covid   2. CHA2DS2VASc score of 6 Continue  xarelto 20 mg daily  He missed doses x 2 days for back injection  and has been back on since 1/4 Eligible for DCCV after 1/25  F/u with  afib clinic one week after cardioversion   Butch Penny C. Jelicia Nantz, [facility] [address] [place], [place] [address] [phone]

## 2020-06-20 ENCOUNTER — Other Ambulatory Visit (HOSPITAL_COMMUNITY): Payer: Self-pay | Admitting: *Deleted

## 2020-06-25 ENCOUNTER — Other Ambulatory Visit (HOSPITAL_COMMUNITY)
Admission: RE | Admit: 2020-06-25 | Discharge: 2020-06-25 | Disposition: A | Discharge status: Home / Self Care | Payer: Medicare Other | Source: Ambulatory Visit | Attending: Cardiology | Admitting: Cardiology

## 2020-06-25 DIAGNOSIS — Z01812 Encounter for preprocedural laboratory examination: Secondary | ICD-10-CM | POA: Diagnosis not present

## 2020-06-25 DIAGNOSIS — U071 COVID-19: Secondary | ICD-10-CM | POA: Diagnosis not present

## 2020-06-25 LAB — SARS CORONAVIRUS 2 (TAT 6-24 HRS): SARS Coronavirus 2: POSITIVE — AB

## 2020-06-26 ENCOUNTER — Telehealth (HOSPITAL_COMMUNITY): Payer: Self-pay | Admitting: *Deleted

## 2020-06-26 NOTE — Progress Notes (Signed)
Patient's pre-procedure covid test positive.  Pt called at home and notified of positive result and resulting cancellation of procedure tomorrow.  Cardiology office notified of need to reschedule.  Vista Lawman, RN

## 2020-06-26 NOTE — Telephone Encounter (Signed)
Patient notified of covid test resulting positive pre-procedure on 1/24- cardioversion rescheduled for 10 days out 2/4. Pt asymptomatic - instructed to call pcp to inform of positive test and quarantine instructions. Pt  Verbalized agreement.

## 2020-06-27 ENCOUNTER — Ambulatory Visit (HOSPITAL_COMMUNITY): Admission: RE | Admit: 2020-06-27 | Payer: Medicare Other | Source: Home / Self Care | Admitting: Cardiology

## 2020-06-27 ENCOUNTER — Encounter (HOSPITAL_COMMUNITY): Admission: RE | Payer: Self-pay | Source: Home / Self Care

## 2020-06-27 ENCOUNTER — Other Ambulatory Visit (HOSPITAL_COMMUNITY): Payer: Self-pay | Admitting: *Deleted

## 2020-06-27 SURGERY — CARDIOVERSION
Anesthesia: General

## 2020-07-04 ENCOUNTER — Ambulatory Visit (HOSPITAL_COMMUNITY): Payer: Medicare Other | Admitting: Nurse Practitioner

## 2020-07-06 ENCOUNTER — Ambulatory Visit (HOSPITAL_COMMUNITY): Payer: Medicare Other | Admitting: Certified Registered"

## 2020-07-06 ENCOUNTER — Encounter (HOSPITAL_COMMUNITY)
Admission: RE | Disposition: A | Discharge status: Home / Self Care | Payer: Self-pay | Source: Home / Self Care | Attending: Cardiovascular Disease

## 2020-07-06 ENCOUNTER — Ambulatory Visit (HOSPITAL_COMMUNITY)
Admission: RE | Admit: 2020-07-06 | Discharge: 2020-07-06 | Disposition: A | Discharge status: Home / Self Care | Payer: Medicare Other | Attending: Cardiovascular Disease | Admitting: Cardiovascular Disease

## 2020-07-06 ENCOUNTER — Other Ambulatory Visit: Payer: Self-pay

## 2020-07-06 DIAGNOSIS — E785 Hyperlipidemia, unspecified: Secondary | ICD-10-CM | POA: Diagnosis not present

## 2020-07-06 DIAGNOSIS — I2510 Atherosclerotic heart disease of native coronary artery without angina pectoris: Secondary | ICD-10-CM | POA: Insufficient documentation

## 2020-07-06 DIAGNOSIS — I480 Paroxysmal atrial fibrillation: Secondary | ICD-10-CM | POA: Diagnosis not present

## 2020-07-06 DIAGNOSIS — I10 Essential (primary) hypertension: Secondary | ICD-10-CM | POA: Diagnosis not present

## 2020-07-06 DIAGNOSIS — Z951 Presence of aortocoronary bypass graft: Secondary | ICD-10-CM | POA: Insufficient documentation

## 2020-07-06 DIAGNOSIS — Z87891 Personal history of nicotine dependence: Secondary | ICD-10-CM | POA: Diagnosis not present

## 2020-07-06 DIAGNOSIS — Z79899 Other long term (current) drug therapy: Secondary | ICD-10-CM | POA: Diagnosis not present

## 2020-07-06 DIAGNOSIS — Z96641 Presence of right artificial hip joint: Secondary | ICD-10-CM | POA: Diagnosis not present

## 2020-07-06 DIAGNOSIS — Z7901 Long term (current) use of anticoagulants: Secondary | ICD-10-CM | POA: Diagnosis not present

## 2020-07-06 DIAGNOSIS — Z885 Allergy status to narcotic agent status: Secondary | ICD-10-CM | POA: Diagnosis not present

## 2020-07-06 DIAGNOSIS — I4819 Other persistent atrial fibrillation: Secondary | ICD-10-CM | POA: Diagnosis not present

## 2020-07-06 DIAGNOSIS — I214 Non-ST elevation (NSTEMI) myocardial infarction: Secondary | ICD-10-CM | POA: Diagnosis not present

## 2020-07-06 DIAGNOSIS — I6529 Occlusion and stenosis of unspecified carotid artery: Secondary | ICD-10-CM | POA: Insufficient documentation

## 2020-07-06 DIAGNOSIS — Z9989 Dependence on other enabling machines and devices: Secondary | ICD-10-CM | POA: Diagnosis not present

## 2020-07-06 DIAGNOSIS — Z8249 Family history of ischemic heart disease and other diseases of the circulatory system: Secondary | ICD-10-CM | POA: Insufficient documentation

## 2020-07-06 DIAGNOSIS — G4733 Obstructive sleep apnea (adult) (pediatric): Secondary | ICD-10-CM | POA: Diagnosis not present

## 2020-07-06 HISTORY — PX: CARDIOVERSION: SHX1299

## 2020-07-06 LAB — POCT I-STAT, CHEM 8
BUN: 28 mg/dL — ABNORMAL HIGH (ref 8–23)
Calcium, Ion: 1.03 mmol/L — ABNORMAL LOW (ref 1.15–1.40)
Chloride: 106 mmol/L (ref 98–111)
Creatinine, Ser: 1.3 mg/dL — ABNORMAL HIGH (ref 0.61–1.24)
Glucose, Bld: 89 mg/dL (ref 70–99)
HCT: 37 % — ABNORMAL LOW (ref 39.0–52.0)
Hemoglobin: 12.6 g/dL — ABNORMAL LOW (ref 13.0–17.0)
Potassium: 4.4 mmol/L (ref 3.5–5.1)
Sodium: 140 mmol/L (ref 135–145)
TCO2: 26 mmol/L (ref 22–32)

## 2020-07-06 SURGERY — CARDIOVERSION
Anesthesia: General

## 2020-07-06 MED ORDER — SODIUM CHLORIDE 0.9 % IV SOLN
INTRAVENOUS | Status: AC | PRN
  Administered 2020-07-06: 20 mL via INTRAVENOUS

## 2020-07-06 MED ORDER — PROPOFOL 10 MG/ML IV BOLUS
INTRAVENOUS | Status: DC | PRN
  Administered 2020-07-06: 80 mg via INTRAVENOUS

## 2020-07-06 MED ORDER — LIDOCAINE HCL (CARDIAC) PF 100 MG/5ML IV SOSY
PREFILLED_SYRINGE | INTRAVENOUS | Status: DC | PRN
  Administered 2020-07-06: 60 mg via INTRATRACHEAL

## 2020-07-06 NOTE — Anesthesia Preprocedure Evaluation (Signed)
Anesthesia Evaluation  Patient identified by MRN, date of birth, ID band Patient awake    Reviewed: Allergy & Precautions, NPO status   Airway Mallampati: II       Dental   Pulmonary sleep apnea , former smoker,    breath sounds clear to auscultation       Cardiovascular hypertension, + CAD, + Past MI and +CHF   Rhythm:Irregular Rate:Normal     Neuro/Psych  Neuromuscular disease    GI/Hepatic GERD  ,  Endo/Other    Renal/GU Renal disease     Musculoskeletal   Abdominal   Peds  Hematology  (+) anemia ,   Anesthesia Other Findings   Reproductive/Obstetrics                             Anesthesia Physical Anesthesia Plan  ASA: III  Anesthesia Plan: General   Post-op Pain Management:    Induction: Intravenous  PONV Risk Score and Plan: 2 and Propofol infusion  Airway Management Planned: Nasal Cannula and Simple Face Mask  Additional Equipment:   Intra-op Plan:   Post-operative Plan:   Informed Consent: I have reviewed the patients History and Physical, chart, labs and discussed the procedure including the risks, benefits and alternatives for the proposed anesthesia with the patient or authorized representative who has indicated his/her understanding and acceptance.     Dental advisory given  Plan Discussed with: Anesthesiologist  Anesthesia Plan Comments:         Anesthesia Quick Evaluation

## 2020-07-06 NOTE — Anesthesia Procedure Notes (Signed)
Procedure Name: General with mask airway Date/Time: 07/06/2020 9:19 AM Performed by: Lavell Luster, CRNA Pre-anesthesia Checklist: Patient identified, Emergency Drugs available, Suction available, Patient being monitored and Timeout performed Patient Re-evaluated:Patient Re-evaluated prior to induction Oxygen Delivery Method: Ambu bag Preoxygenation: Pre-oxygenation with 100% oxygen Induction Type: IV induction Ventilation: Mask ventilation without difficulty Placement Confirmation: breath sounds checked- equal and bilateral Dental Injury: Teeth and Oropharynx as per pre-operative assessment

## 2020-07-06 NOTE — Transfer of Care (Signed)
Immediate Anesthesia Transfer of Care Note  Patient: Caleb Wolf  Procedure(s) Performed: CARDIOVERSION (N/A )  Patient Location: Endoscopy Unit  Anesthesia Type:General  Level of Consciousness: sedated  Airway & Oxygen Therapy: Patient connected to nasal cannula oxygen  Post-op Assessment: Post -op Vital signs reviewed and stable  Post vital signs: stable  Last Vitals:  Vitals Value Taken Time  BP    Temp    Pulse    Resp    SpO2      Last Pain:  Vitals:   07/06/20 0850  TempSrc: Temporal  PainSc: 0-No pain         Complications: No complications documented.

## 2020-07-06 NOTE — Discharge Instructions (Signed)
Electrical Cardioversion What can I expect after the procedure?  Your blood pressure, heart rate, breathing rate, and blood oxygen level will be monitored until you leave the hospital or clinic.  Your heart rhythm will be watched to make sure it does not change.  You may have some redness on the skin where the shocks were given. Follow these instructions at home:  Do not drive for 24 hours if you were given a sedative during your procedure.  Take over-the-counter and prescription medicines only as told by your health care provider.  Ask your health care provider how to check your pulse. Check it often.  Rest for 48 hours after the procedure or as told by your health care provider.  Avoid or limit your caffeine use as told by your health care provider.  Keep all follow-up visits as told by your health care provider. This is important. Contact a health care provider if:  You feel like your heart is beating too quickly or your pulse is not regular.  You have a serious muscle cramp that does not go away. Get help right away if:  You have discomfort in your chest.  You are dizzy or you feel faint.  You have trouble breathing or you are short of breath.  Your speech is slurred.  You have trouble moving an arm or leg on one side of your body.  Your fingers or toes turn cold or blue. Summary  Electrical cardioversion is the delivery of a jolt of electricity to restore a normal rhythm to the heart.  This procedure may be done right away in an emergency or may be a scheduled procedure if the condition is not an emergency.  Generally, this is a safe procedure.  After the procedure, check your pulse often as told by your health care provider. This information is not intended to replace advice given to you by your health care provider. Make sure you discuss any questions you have with your health care provider. Document Revised: 12/20/2018 Document Reviewed: 12/20/2018 Elsevier  Patient Education  [place].

## 2020-07-06 NOTE — Interval H&P Note (Signed)
History and Physical Interval Note:  07/06/2020 8:53 AM  Caleb Wolf  has presented today for surgery, with the diagnosis of AFIB.  The various methods of treatment have been discussed with the patient and family. After consideration of risks, benefits and other options for treatment, the patient has consented to  Procedure(s): CARDIOVERSION (N/A) as a surgical intervention.  The patient's history has been reviewed, patient examined, no change in status, stable for surgery.  I have reviewed the patient's chart and labs.  Questions were answered to the patient's satisfaction.     [name]

## 2020-07-06 NOTE — Anesthesia Postprocedure Evaluation (Signed)
Anesthesia Post Note  Patient: Caleb Wolf  Procedure(s) Performed: CARDIOVERSION (N/A )     Patient location during evaluation: Endoscopy Anesthesia Type: General Level of consciousness: awake Pain management: pain level controlled Vital Signs Assessment: post-procedure vital signs reviewed and stable Respiratory status: spontaneous breathing Cardiovascular status: stable Postop Assessment: no apparent nausea or vomiting Anesthetic complications: no   No complications documented.  Last Vitals:  Vitals:   07/06/20 0850 07/06/20 0930  BP: 127/74 (!) 101/49  Pulse: (!) 124 96  Resp: (!) 21 (!) 22  Temp: 36.5 C 37.1 C  SpO2: 100% 94%    Last Pain:  Vitals:   07/06/20 0930  TempSrc: Axillary  PainSc:                  Cheyeanne Roadcap

## 2020-07-06 NOTE — Anesthesia Postprocedure Evaluation (Signed)
Anesthesia Post Note  Patient: Caleb Wolf  Procedure(s) Performed: CARDIOVERSION (N/A )     Patient location during evaluation: Endoscopy Anesthesia Type: General Level of consciousness: awake Pain management: pain level controlled Vital Signs Assessment: post-procedure vital signs reviewed and stable Respiratory status: spontaneous breathing Cardiovascular status: stable Postop Assessment: no apparent nausea or vomiting Anesthetic complications: no   No complications documented.  Last Vitals:  Vitals:   07/06/20 0940 07/06/20 0951  BP: (!) 98/48 124/79  Pulse: (!) 49 77  Resp: 20 14  Temp:    SpO2: 98% 99%    Last Pain:  Vitals:   07/06/20 0930  TempSrc: Axillary  PainSc:                  Nikita Surman

## 2020-07-06 NOTE — CV Procedure (Signed)
Procedure: Electrical Cardioversion Indications:  Atrial Fibrillation  Procedure Details:  Consent: Risks of procedure as well as the alternatives and risks of each were explained to the (patient/caregiver).  Consent for procedure obtained.  Time Out: Verified patient identification, verified procedure, site/side was marked, verified correct patient position, special equipment/implants available, medications/allergies/relevent history reviewed, required imaging and test results available.  Performed  Patient placed on cardiac monitor, pulse oximetry, supplemental oxygen as necessary.  Sedation given: IV propofol, Dr. Carlota Raspberry Pacer pads placed anterior and posterior chest.  Cardioverted 1 time(s).  Cardioversion with synchronized biphasic 150J shock.  Evaluation: Findings: Post procedure EKG shows: NSR with very frequent PACs and atrial couplets Complications: None Patient did tolerate procedure well.  Time Spent Directly with the Patient:  30 minutes   Caleb Wolf 07/06/2020, 9:26 AM

## 2020-07-08 ENCOUNTER — Encounter (HOSPITAL_COMMUNITY): Payer: Self-pay | Admitting: Cardiovascular Disease

## 2020-07-09 LAB — POCT I-STAT, CHEM 8
BUN: 39 mg/dL — ABNORMAL HIGH (ref 8–23)
Calcium, Ion: 1.08 mmol/L — ABNORMAL LOW (ref 1.15–1.40)
Chloride: 105 mmol/L (ref 98–111)
Creatinine, Ser: 1.4 mg/dL — ABNORMAL HIGH (ref 0.61–1.24)
Glucose, Bld: 89 mg/dL (ref 70–99)
HCT: 38 % — ABNORMAL LOW (ref 39.0–52.0)
Hemoglobin: 12.9 g/dL — ABNORMAL LOW (ref 13.0–17.0)
Potassium: 6.4 mmol/L (ref 3.5–5.1)
Sodium: 138 mmol/L (ref 135–145)
TCO2: 27 mmol/L (ref 22–32)

## 2020-07-13 ENCOUNTER — Other Ambulatory Visit: Payer: Self-pay

## 2020-07-13 ENCOUNTER — Encounter (HOSPITAL_COMMUNITY): Payer: Self-pay | Admitting: Nurse Practitioner

## 2020-07-13 ENCOUNTER — Ambulatory Visit (HOSPITAL_COMMUNITY)
Admission: RE | Admit: 2020-07-13 | Discharge: 2020-07-13 | Disposition: A | Discharge status: Home / Self Care | Payer: Medicare Other | Source: Ambulatory Visit | Attending: Nurse Practitioner | Admitting: Nurse Practitioner

## 2020-07-13 VITALS — BP 124/58 | HR 60 | Ht 71.0 in | Wt 229.0 lb

## 2020-07-13 DIAGNOSIS — Z87891 Personal history of nicotine dependence: Secondary | ICD-10-CM | POA: Diagnosis not present

## 2020-07-13 DIAGNOSIS — Z7901 Long term (current) use of anticoagulants: Secondary | ICD-10-CM | POA: Diagnosis not present

## 2020-07-13 DIAGNOSIS — M961 Postlaminectomy syndrome, not elsewhere classified: Secondary | ICD-10-CM | POA: Diagnosis not present

## 2020-07-13 DIAGNOSIS — Z951 Presence of aortocoronary bypass graft: Secondary | ICD-10-CM | POA: Diagnosis not present

## 2020-07-13 DIAGNOSIS — M48061 Spinal stenosis, lumbar region without neurogenic claudication: Secondary | ICD-10-CM | POA: Diagnosis not present

## 2020-07-13 DIAGNOSIS — D6869 Other thrombophilia: Secondary | ICD-10-CM | POA: Diagnosis not present

## 2020-07-13 DIAGNOSIS — Z79899 Other long term (current) drug therapy: Secondary | ICD-10-CM | POA: Insufficient documentation

## 2020-07-13 DIAGNOSIS — I4819 Other persistent atrial fibrillation: Secondary | ICD-10-CM

## 2020-07-13 DIAGNOSIS — I4891 Unspecified atrial fibrillation: Secondary | ICD-10-CM | POA: Diagnosis not present

## 2020-07-13 MED ORDER — DILTIAZEM HCL ER COATED BEADS 120 MG PO CP24: 120.0000 mg | ORAL_CAPSULE | Freq: Every day | ORAL | 6 refills | Status: DC

## 2020-07-13 NOTE — Progress Notes (Signed)
Primary Care Physician: Asencion Noble, MD Referring Physician: [name] is a 80 y.o. male with a h/o afib, s/p ablation 2019. He had a cardioversion in October  and recently on f/u with Dr. Rayann Wolf 06/04/20 he was in [place].  He had a back injection the next day and restarted blood thinner 1/4. He started back in afib on 1/6. He is taking cardizem 30 mg every 4 hours with reasonable rate control.   F/u, 07/13/20,  successful cardioversion 2/4. He did have frequent PAC's after the procedure but is in SR today. He continues the 30 mg Cardizem q 6 hours. I will change to 120 mg daily.   Today, he denies symptoms of palpitations, chest pain, shortness of breath, orthopnea, PND, lower extremity edema, dizziness, presyncope, syncope, or neurologic sequela. The patient is tolerating medications without difficulties and is otherwise without complaint today.   Past Medical History:  Diagnosis Date  . Arthritis   . Basal cell carcinoma   . Carotid stenosis   . Coronary artery disease    Multvessel s/p CABG 2015  . Enlarged prostate   . Essential hypertension   . GERD (gastroesophageal reflux disease)   . Gout   . History of colon polyps   . History of kidney stones   . Hyperlipidemia   . Lumbar disc disease   . Persistent atrial fibrillation (Caleb Wolf)   . Sleep apnea    CPAP   Past Surgical History:  Procedure Laterality Date  . ABLATION OF DYSRHYTHMIC FOCUS  07/28/2017  . ATRIAL FIBRILLATION ABLATION N/A 07/28/2017   Procedure: ATRIAL FIBRILLATION ABLATION;  Surgeon: Thompson Grayer, MD;  Location: [place] [facility];  Service: Cardiovascular;  Laterality: N/A;  . BACK SURGERY    . CARDIAC CATHETERIZATION  05/22/2014   Procedure: IABP INSERTION;  Surgeon: Leonie Man, MD;  Location: [facility] CATH LAB;  Service: Cardiovascular;;  . CARDIOVERSION N/A 04/29/2017   Procedure: CARDIOVERSION;  Surgeon: Satira Sark, MD;  Location: [facility];  Service: Cardiovascular;  Laterality:  N/A;  . CARDIOVERSION N/A 08/13/2017   Procedure: CARDIOVERSION;  Surgeon: Pixie Casino, MD;  Location: [facility] ENDOSCOPY;  Service: Cardiovascular;  Laterality: N/A;  . CARDIOVERSION N/A 08/15/2019   Procedure: CARDIOVERSION;  Surgeon: Dorothy Spark, MD;  Location: [facility] ENDOSCOPY;  Service: Cardiovascular;  Laterality: N/A;  . CARDIOVERSION N/A 03/09/2020   Procedure: CARDIOVERSION;  Surgeon: Buford Dresser, MD;  Location: [place];  Service: Cardiovascular;  Laterality: N/A;  . CARDIOVERSION N/A 07/06/2020   Procedure: CARDIOVERSION;  Surgeon: Sanda Klein, MD;  Location: [facility] ENDOSCOPY;  Service: Cardiovascular;  Laterality: N/A;  . Cataract surgery Right   . COLONOSCOPY    . COLONOSCOPY N/A 12/30/2017   Procedure: COLONOSCOPY;  Surgeon: Rogene Houston, MD;  Location: [facility];  Service: Endoscopy;  Laterality: N/A;  . CORONARY ARTERY BYPASS GRAFT N/A 05/22/2014   Procedure: CORONARY ARTERY BYPASS GRAFTING (CABG) times three using left internal mammary and right saphenous vein.;  Surgeon: Melrose Nakayama, MD;  Location: [place];  Service: Open Heart Surgery;  Laterality: N/A;  . ENDARTERECTOMY Left 02/17/2014   Procedure: ENDARTERECTOMY CAROTID WITH PATCH ANGIOPLASTY;  Surgeon: Mal Misty, MD;  Location: [place];  Service: Vascular;  Laterality: Left;  . ESOPHAGEAL DILATION N/A 08/22/2015   Procedure: ESOPHAGEAL DILATION;  Surgeon: Rogene Houston, MD;  Location: [facility];  Service: Endoscopy;  Laterality: N/A;  . ESOPHAGOGASTRODUODENOSCOPY N/A 08/22/2015   Procedure:  ESOPHAGOGASTRODUODENOSCOPY (EGD);  Surgeon: Rogene Houston, MD;  Location: [facility];  Service: Endoscopy;  Laterality: N/A;  12:45 - moved to 1:55 - Ann notified pt  . ESOPHAGOGASTRODUODENOSCOPY N/A 12/30/2017   Procedure: ESOPHAGOGASTRODUODENOSCOPY (EGD);  Surgeon: Rogene Houston, MD;  Location: [facility];  Service: Endoscopy;  Laterality: N/A;  200  . EYE SURGERY     cataract extraction  (right) with repair macular tear , with IOL     right  . FRACTURE SURGERY     bilateral wrist fractures- one ORIF  . JOINT REPLACEMENT  2011   left knee  . LEFT HEART CATHETERIZATION WITH CORONARY ANGIOGRAM N/A 05/22/2014   Procedure: LEFT HEART CATHETERIZATION WITH CORONARY ANGIOGRAM;  Surgeon: Leonie Man, MD;  Location: [facility] CATH LAB;  Service: Cardiovascular;  Laterality: N/A;  . LITHOTRIPSY    . PARS PLANA VITRECTOMY W/ REPAIR OF MACULAR HOLE    . RHINOPLASTY    . TEE WITHOUT CARDIOVERSION N/A 04/29/2017   Procedure: TRANSESOPHAGEAL ECHOCARDIOGRAM (TEE) WITH PROPOFOL;  Surgeon: Satira Sark, MD;  Location: [facility];  Service: Cardiovascular;  Laterality: N/A;  . TONSILLECTOMY    . TOTAL HIP ARTHROPLASTY  12/08/2011   Procedure: TOTAL HIP ARTHROPLASTY;  Surgeon: Gearlean Alf, MD;  Location: [facility] ORS;  Service: Orthopedics;  Laterality: Right;  . UPPER GI ENDOSCOPY  12/18/2015   Procedure: UPPER GI ENDOSCOPY;  Surgeon: Ralene Ok, MD;  Location: [facility] ORS;  Service: General;;  . WRIST SURGERY Right 20yrs ago  . WRIST SURGERY Left     Current Outpatient Medications  Medication Sig Dispense Refill  . acetaminophen (TYLENOL) 500 MG tablet Take 1,000 mg by mouth as needed for moderate pain.     Marland Kitchen amoxicillin (AMOXIL) 500 MG capsule Take 2,000 mg by mouth once.    Marland Kitchen atorvastatin (LIPITOR) 80 MG tablet Take 1 tablet (80 mg total) by mouth every evening. 90 tablet 3  . colchicine 0.6 MG tablet Take 0.6 mg by mouth daily as needed (for gout flare up).     . Cyanocobalamin (B-12) 1000 MCG SUBL Place 1,000 mcg under the tongue daily.     . cyclobenzaprine (FLEXERIL) 10 MG tablet Take 10 mg by mouth 2 (two) times daily as needed for muscle spasms.    Marland Kitchen doxazosin (CARDURA) 4 MG tablet Take 4 mg by mouth daily.  4  . ELDERBERRY PO Take 50 mg by mouth daily. Gummie    . finasteride (PROSCAR) 5 MG tablet Take 5 mg by mouth every evening.     . furosemide (LASIX) 40 MG tablet Take 1  tablet (40 mg total) by mouth 2 (two) times daily. 180 tablet 0  . lisinopril (PRINIVIL,ZESTRIL) 40 MG tablet Take 40 mg by mouth daily.    . nitroGLYCERIN (NITROSTAT) 0.4 MG SL tablet Place 1 tablet (0.4 mg total) under the tongue every 5 (five) minutes x 3 doses as needed for chest pain. 75 tablet 3  . pantoprazole (PROTONIX) 40 MG tablet Take 1 tablet (40 mg total) by mouth daily before breakfast. 90 tablet 3  . rivaroxaban (XARELTO) 20 MG TABS tablet Take 1 tablet (20 mg total) by mouth daily with supper. 90 tablet 3   No current facility-administered medications for this encounter.    Allergies  Allergen Reactions  . Codeine Sulfate Nausea Only    Social History   Socioeconomic History  . Marital status: Married    Spouse name: Not on file  . Number of  children: Not on file  . Years of education: Not on file  . Highest education level: Not on file  Occupational History  . Occupation: retired    Comment: [place] [employer]  Tobacco Use  . Smoking status: Former Smoker    Packs/day: 1.50    Years: 25.00    Pack years: 37.50    Types: Cigarettes    Start date: 08/08/1960    Quit date: 06/02/1982    Years since quitting: 38.1  . Smokeless tobacco: Never Used  . Tobacco comment: quit smoking 30+yrs ago  Vaping Use  . Vaping Use: Never used  Substance and Sexual Activity  . Alcohol use: No    Alcohol/week: 0.0 standard drinks    Comment: occasionally   . Drug use: No  . Sexual activity: Yes  Other Topics Concern  . Not on file  Social History Narrative  . Not on file   Social Determinants of Health   Financial Resource Strain: Not on file  Food Insecurity: Not on file  Transportation Needs: Not on file  Physical Activity: Not on file  Stress: Not on file  Social Connections: Not on file  Intimate Partner Violence: Not on file    Family History  Problem Relation Age of Onset  . Allergies Mother   . Heart disease Mother   . Hypertension Mother   . Heart  disease Father        MI    ROS- All systems are reviewed and negative except as per the HPI above  Physical Exam: Vitals:   07/13/20 1047  BP: (!) 124/58  Pulse: 60  Weight: 103.9 kg  Height: 5\' 11"  (1.803 m)   Wt Readings from Last 3 Encounters:  07/13/20 103.9 kg  07/06/20 104.3 kg  06/19/20 104.3 kg    Labs: Lab Results  Component Value Date   NA 140 07/06/2020   K 4.4 07/06/2020   CL 106 07/06/2020   CO2 23 06/19/2020   GLUCOSE 89 07/06/2020   BUN 28 (H) 07/06/2020   CREATININE 1.30 (H) 07/06/2020   CALCIUM 8.8 (L) 06/19/2020   PHOS 3.3 04/29/2017   MG 1.9 04/30/2017   Lab Results  Component Value Date   INR 2.2 (H) 08/15/2019   Lab Results  Component Value Date   CHOL 134 03/22/2015   HDL 41 03/22/2015   LDLCALC 75 03/22/2015   TRIG 92 03/22/2015     GEN- The patient is well appearing, alert and oriented x 3 today.   Head- normocephalic, atraumatic Eyes-  Sclera clear, conjunctiva pink Ears- hearing intact Oropharynx- clear Neck- supple, no JVP Lymph- no cervical lymphadenopathy Lungs- Clear to ausculation bilaterally, normal work of breathing Heart- regular rate and rhythm, no murmurs, rubs or gallops, PMI not laterally displaced GI- soft, NT, ND, + BS Extremities- no clubbing, cyanosis, or edema MS- no significant deformity or atrophy Skin- no rash or lesion Psych- euthymic mood, full affect Neuro- strength and sensation are intact  EKG-  NSR at 60 bpm, pr int 172 ms, qrs int 86 ms, qtc 420 ms     Assessment and Plan: 1. Afib  Successful  cardioversion in  March 2021 and again 03/09/20 Recent  steroid back injection may have triggered afib Successful cardioversion  Will change to 120 mg cardizem daily  instead of 30 mg cardizem q 6 hrs   Per Dr. Jackalyn Lombard note, if ERAF, he should be considered for repeat ablation  2. [id number] score of 6 Continue  xarelto 20 mg daily   F/u with afib clinic as needed  Dr. Rayann Wolf January 2023    Caleb Wolf, [facility] [address] [place], [place] [address] [phone]

## 2020-07-16 DIAGNOSIS — M545 Low back pain, unspecified: Secondary | ICD-10-CM | POA: Diagnosis not present

## 2020-07-19 DIAGNOSIS — M545 Low back pain, unspecified: Secondary | ICD-10-CM | POA: Diagnosis not present

## 2020-07-25 DIAGNOSIS — M545 Low back pain, unspecified: Secondary | ICD-10-CM | POA: Diagnosis not present

## 2020-07-27 DIAGNOSIS — M545 Low back pain, unspecified: Secondary | ICD-10-CM | POA: Diagnosis not present

## 2020-07-30 DIAGNOSIS — M545 Low back pain, unspecified: Secondary | ICD-10-CM | POA: Diagnosis not present

## 2020-07-30 DIAGNOSIS — L570 Actinic keratosis: Secondary | ICD-10-CM | POA: Diagnosis not present

## 2020-08-02 DIAGNOSIS — M545 Low back pain, unspecified: Secondary | ICD-10-CM | POA: Diagnosis not present

## 2020-08-13 DIAGNOSIS — M545 Low back pain, unspecified: Secondary | ICD-10-CM | POA: Diagnosis not present

## 2020-08-16 DIAGNOSIS — M961 Postlaminectomy syndrome, not elsewhere classified: Secondary | ICD-10-CM | POA: Diagnosis not present

## 2020-08-16 DIAGNOSIS — M545 Low back pain, unspecified: Secondary | ICD-10-CM | POA: Diagnosis not present

## 2020-08-16 DIAGNOSIS — M48061 Spinal stenosis, lumbar region without neurogenic claudication: Secondary | ICD-10-CM | POA: Diagnosis not present

## 2020-08-22 NOTE — Progress Notes (Signed)
Subjective:    Patient ID: Caleb Wolf, male    DOB: 1941-04-14, 80 y.o.   MRN: [id number]  HPI   Male former smoker followed for OSA, complicated by HBP, CAD/MI/CABG, carotid stenosis, osteoarthritis hip, GERD NPSG 07/2012  AHI 15/ hr, CPAP to 10 ---------------------------------------------------.   08/24/19- 80 year old male former smoker followed for OSA, complicated by HBP, CAD/MI/CABG/AFib, carotid stenosis, osteoarthritis hip, GERD CPAP auto 5-15/[employer] Download compliance 100%, AHI 3.3/ hr Sleeping well with CPAP Recent repeat cardioversion- so far so good.  Notes some DOE, within his expected range, no cough, wheeze or change.   [id number] 80 year old male former smoker followed for OSA, complicated by HBP, CAD/MI/CABG/AFib/ Xarelto, carotid stenosis, osteoarthritis hip, GERD CPAP auto 5-15/[employer] Download-compliance 90%, AHI 1.8/ hr     AirSense 10 AutoSet Body weight today-233 lbs Covid vax-3 Moderna Flu vax-had Doing well, using CPAP Sleeps better with CPAP. Download reviewed.  ROS-see HPI + = positive Constitutional:    weight loss, night sweats, fevers, chills, fatigue, lassitude. HEENT:    headaches, difficulty swallowing, tooth/dental problems, sore throat,       sneezing, itching, ear ache, nasal congestion, post nasal drip, snoring CV:    chest pain, orthopnea, PND, swelling in lower extremities, anasarca,                                               dizziness, palpitations Resp:   +shortness of breath with exertion or at rest.                productive cough,   non-productive cough, coughing up of blood.              change in color of mucus.  wheezing.   Skin:    rash or lesions. GI:  No-   heartburn, indigestion, abdominal pain, nausea, vomiting, diarrhea,                 change in bowel habits, loss of appetite GU: dysuria, change in color of urine, no urgency or frequency.   flank pain. MS:   joint pain, stiffness, decreased range of motion, back pain. Neuro-      nothing unusual Psych:  change in mood or affect.  depression or anxiety.   memory loss.    Objective:  OBJ- Physical Exam General- Alert, Oriented, Affect-appropriate, Distress- none acute, + overweight Skin- rash-none, lesions- none, excoriation- none Lymphadenopathy- none Head- atraumatic            Eyes- Gross vision intact, PERRLA, conjunctivae and secretions clear            Ears- Hearing, canals-normal            Nose- Clear, no-Septal dev, mucus, polyps, erosion, perforation             Throat- Mallampati III-IV, mucosa clear , drainage- none, tonsils- atrophic Neck- flexible , trachea midline, no stridor , thyroid nl, carotid no bruit Chest - symmetrical excursion , unlabored           Heart/CV- RR+today , no murmur , no gallop  , no rub, nl s1 s2                           - JVD- none , edema- none, stasis changes- none, varices- none  Lung- clear to P&A, wheeze- none, cough- none , dullness-none, rub- none           Chest wall-  Abd-  Br/ Gen/ Rectal- Not done, not indicated Extrem- cyanosis- none, clubbing, none, atrophy- none, strength- nl Neuro- grossly intact to observation

## 2020-08-23 ENCOUNTER — Encounter: Payer: Self-pay | Admitting: Internal Medicine

## 2020-08-23 ENCOUNTER — Ambulatory Visit (INDEPENDENT_AMBULATORY_CARE_PROVIDER_SITE_OTHER): Payer: Medicare Other | Admitting: Internal Medicine

## 2020-08-23 ENCOUNTER — Other Ambulatory Visit: Payer: Self-pay

## 2020-08-23 VITALS — BP 118/60 | HR 68 | Temp 98.0°F | Ht 71.0 in | Wt 233.6 lb

## 2020-08-23 DIAGNOSIS — G4733 Obstructive sleep apnea (adult) (pediatric): Secondary | ICD-10-CM | POA: Diagnosis not present

## 2020-08-23 DIAGNOSIS — I4891 Unspecified atrial fibrillation: Secondary | ICD-10-CM | POA: Diagnosis not present

## 2020-08-23 DIAGNOSIS — I2510 Atherosclerotic heart disease of native coronary artery without angina pectoris: Secondary | ICD-10-CM

## 2020-08-23 NOTE — Patient Instructions (Signed)
Order- DME [employer]- continue CPAP auto 5-20 Please help with complaint that hose is disconnecting too easily and waking him.  Please call if we can help

## 2020-08-30 DIAGNOSIS — M109 Gout, unspecified: Secondary | ICD-10-CM | POA: Diagnosis not present

## 2020-08-30 DIAGNOSIS — D649 Anemia, unspecified: Secondary | ICD-10-CM | POA: Diagnosis not present

## 2020-08-30 DIAGNOSIS — Z79899 Other long term (current) drug therapy: Secondary | ICD-10-CM | POA: Diagnosis not present

## 2020-08-30 DIAGNOSIS — N183 Chronic kidney disease, stage 3 unspecified: Secondary | ICD-10-CM | POA: Diagnosis not present

## 2020-08-30 DIAGNOSIS — I480 Paroxysmal atrial fibrillation: Secondary | ICD-10-CM | POA: Diagnosis not present

## 2020-09-10 DIAGNOSIS — I10 Essential (primary) hypertension: Secondary | ICD-10-CM | POA: Diagnosis not present

## 2020-09-10 DIAGNOSIS — N1831 Chronic kidney disease, stage 3a: Secondary | ICD-10-CM | POA: Diagnosis not present

## 2020-09-10 DIAGNOSIS — I2510 Atherosclerotic heart disease of native coronary artery without angina pectoris: Secondary | ICD-10-CM | POA: Diagnosis not present

## 2020-09-10 DIAGNOSIS — G4733 Obstructive sleep apnea (adult) (pediatric): Secondary | ICD-10-CM | POA: Diagnosis not present

## 2020-09-10 DIAGNOSIS — Z6833 Body mass index (BMI) 33.0-33.9, adult: Secondary | ICD-10-CM | POA: Diagnosis not present

## 2020-09-10 DIAGNOSIS — I700 Atherosclerosis of aorta: Secondary | ICD-10-CM | POA: Diagnosis not present

## 2020-09-10 DIAGNOSIS — E785 Hyperlipidemia, unspecified: Secondary | ICD-10-CM | POA: Diagnosis not present

## 2020-09-15 ENCOUNTER — Other Ambulatory Visit: Payer: Self-pay

## 2020-09-15 ENCOUNTER — Ambulatory Visit
Admission: EM | Admit: 2020-09-15 | Discharge: 2020-09-15 | Disposition: A | Discharge status: Home / Self Care | Payer: Medicare Other | Attending: Emergency Medicine | Admitting: Emergency Medicine

## 2020-09-15 ENCOUNTER — Encounter: Payer: Self-pay | Admitting: Emergency Medicine

## 2020-09-15 DIAGNOSIS — L03116 Cellulitis of left lower limb: Secondary | ICD-10-CM

## 2020-09-15 MED ORDER — DOXYCYCLINE HYCLATE 100 MG PO CAPS: 100.0000 mg | ORAL_CAPSULE | Freq: Two times a day (BID) | ORAL | 0 refills | Status: DC

## 2020-09-15 NOTE — ED Triage Notes (Signed)
Red swollen area on  Left lower leg, warm to the touch and itchy x 3 days.

## 2020-09-15 NOTE — ED Provider Notes (Signed)
[facility]   [id number] 09/15/20 Arrival Time: 0175  CC: SKIN COMPLAINT  SUBJECTIVE:  Caleb Wolf is a 80 y.o. male who presents with an area of redness and swelling to LLE x 3 days.  Denies precipitating event or trauma.  Denies changes in soaps, detergents, close contacts with similar rash, known trigger or environmental trigger, allergy. Denies medications change or starting a new medication recently.  States he was walking out in the yard prior to symptoms.  Localizes the rash to LT outer leg.  Describes it as painful, red, and swollen.  Has tried elevation and ice without relief.  Symptoms are made worse to the touch.  Denies similar symptoms in the past.   Denies fever, chills, nausea, vomiting, discharge, SOB, chest pain, abdominal pain, changes in bowel or bladder function.    ROS: As per HPI.  All other pertinent ROS negative.     Past Medical History:  Diagnosis Date  . Arthritis   . Basal cell carcinoma   . Carotid stenosis   . Coronary artery disease    Multvessel s/p CABG 2015  . Enlarged prostate   . Essential hypertension   . GERD (gastroesophageal reflux disease)   . Gout   . History of colon polyps   . History of kidney stones   . Hyperlipidemia   . Lumbar disc disease   . Persistent atrial fibrillation ([place])   . Sleep apnea    CPAP   Past Surgical History:  Procedure Laterality Date  . ABLATION OF DYSRHYTHMIC FOCUS  07/28/2017  . ATRIAL FIBRILLATION ABLATION N/A 07/28/2017   Procedure: ATRIAL FIBRILLATION ABLATION;  Surgeon: Thompson Grayer, MD;  Location: [place] [facility];  Service: Cardiovascular;  Laterality: N/A;  . BACK SURGERY    . CARDIAC CATHETERIZATION  05/22/2014   Procedure: IABP INSERTION;  Surgeon: Leonie Man, MD;  Location: [facility] CATH LAB;  Service: Cardiovascular;;  . CARDIOVERSION N/A 04/29/2017   Procedure: CARDIOVERSION;  Surgeon: Satira Sark, MD;  [facility];  Service: Cardiovascular;  Laterality: N/A;  .  CARDIOVERSION N/A 08/13/2017   Procedure: CARDIOVERSION;  Surgeon: Pixie Casino, MD;  Location: [facility] ENDOSCOPY;  Service: Cardiovascular;  Laterality: N/A;  . CARDIOVERSION N/A 08/15/2019   Procedure: CARDIOVERSION;  Surgeon: Dorothy Spark, MD;  Location: [facility] ENDOSCOPY;  Service: Cardiovascular;  Laterality: N/A;  . CARDIOVERSION N/A 03/09/2020   Procedure: CARDIOVERSION;  Surgeon: Buford Dresser, MD;  Location: [place];  Service: Cardiovascular;  Laterality: N/A;  . CARDIOVERSION N/A 07/06/2020   Procedure: CARDIOVERSION;  Surgeon: Sanda Klein, MD;  Location: [facility] ENDOSCOPY;  Service: Cardiovascular;  Laterality: N/A;  . Cataract surgery Right   . COLONOSCOPY    . COLONOSCOPY N/A 12/30/2017   Procedure: COLONOSCOPY;  Surgeon: Rogene Houston, MD;  [facility];  Service: Endoscopy;  Laterality: N/A;  . CORONARY ARTERY BYPASS GRAFT N/A 05/22/2014   Procedure: CORONARY ARTERY BYPASS GRAFTING (CABG) times three using left internal mammary and right saphenous vein.;  Surgeon: Melrose Nakayama, MD;  Location: [place];  Service: Open Heart Surgery;  Laterality: N/A;  . ENDARTERECTOMY Left 02/17/2014   Procedure: ENDARTERECTOMY CAROTID WITH PATCH ANGIOPLASTY;  Surgeon: Mal Misty, MD;  Location: [place];  Service: Vascular;  Laterality: Left;  . ESOPHAGEAL DILATION N/A 08/22/2015   Procedure: ESOPHAGEAL DILATION;  Surgeon: Rogene Houston, MD;  [facility];  Service: Endoscopy;  Laterality: N/A;  . ESOPHAGOGASTRODUODENOSCOPY N/A 08/22/2015   Procedure:  ESOPHAGOGASTRODUODENOSCOPY (EGD);  Surgeon: Rogene Houston, MD;  [facility];  Service: Endoscopy;  Laterality: N/A;  12:45 - moved to 1:55 - Ann notified pt  . ESOPHAGOGASTRODUODENOSCOPY N/A 12/30/2017   Procedure: ESOPHAGOGASTRODUODENOSCOPY (EGD);  Surgeon: Rogene Houston, MD;  [facility];  Service: Endoscopy;  Laterality: N/A;  200  . EYE SURGERY     cataract extraction (right) with  repair macular tear , with IOL     right  . FRACTURE SURGERY     bilateral wrist fractures- one ORIF  . JOINT REPLACEMENT  2011   left knee  . LEFT HEART CATHETERIZATION WITH CORONARY ANGIOGRAM N/A 05/22/2014   Procedure: LEFT HEART CATHETERIZATION WITH CORONARY ANGIOGRAM;  Surgeon: Leonie Man, MD;  Location: [facility] CATH LAB;  Service: Cardiovascular;  Laterality: N/A;  . LITHOTRIPSY    . PARS PLANA VITRECTOMY W/ REPAIR OF MACULAR HOLE    . RHINOPLASTY    . TEE WITHOUT CARDIOVERSION N/A 04/29/2017   Procedure: TRANSESOPHAGEAL ECHOCARDIOGRAM (TEE) WITH PROPOFOL;  Surgeon: Satira Sark, MD;  [facility];  Service: Cardiovascular;  Laterality: N/A;  . TONSILLECTOMY    . TOTAL HIP ARTHROPLASTY  12/08/2011   Procedure: TOTAL HIP ARTHROPLASTY;  Surgeon: Gearlean Alf, MD;  Location: [facility];  Service: Orthopedics;  Laterality: Right;  . UPPER GI ENDOSCOPY  12/18/2015   Procedure: UPPER GI ENDOSCOPY;  Surgeon: Ralene Ok, MD;  Location: [facility] OR[facility];  Service: General;;  . WRIST SURGERY Right 35yrs ago  . WRIST SURGERY Left    Allergies  Allergen Reactions  . Codeine Sulfate Nausea Only   No current facility-administered medications on file prior to encounter.   Current Outpatient Medications on File Prior to Encounter  Medication Sig Dispense Refill  . acetaminophen (TYLENOL) 500 MG tablet Take 1,000 mg by mouth as needed for moderate pain.  (Patient not taking: Reported on 08/23/2020)    . amoxicillin (AMOXIL) 500 MG capsule Take 2,000 mg by mouth once. (Patient not taking: Reported on 08/23/2020)    . atorvastatin (LIPITOR) 80 MG tablet Take 1 tablet (80 mg total) by mouth every evening. 90 tablet 3  . colchicine 0.6 MG tablet Take 0.6 mg by mouth daily as needed (for gout flare up).  (Patient not taking: Reported on 08/23/2020)    . Cyanocobalamin (B-12) 1000 MCG SUBL Place 1,000 mcg under the tongue daily.     . cyclobenzaprine (FLEXERIL) 10 MG tablet Take 10 mg by mouth 2  (two) times daily as needed for muscle spasms. (Patient not taking: Reported on 08/23/2020)    . diltiazem (CARDIZEM CD) 120 MG 24 hr capsule Take 1 capsule (120 mg total) by mouth daily. 30 capsule 6  . doxazosin (CARDURA) 4 MG tablet Take 4 mg by mouth daily.  4  . ELDERBERRY PO Take 50 mg by mouth daily. Gummie    . finasteride (PROSCAR) 5 MG tablet Take 5 mg by mouth every evening.     . furosemide (LASIX) 40 MG tablet Take 1 tablet (40 mg total) by mouth 2 (two) times daily. 180 tablet 0  . lisinopril (PRINIVIL,ZESTRIL) 40 MG tablet Take 40 mg by mouth daily.    . nitroGLYCERIN (NITROSTAT) 0.4 MG SL tablet Place 1 tablet (0.4 mg total) under the tongue every 5 (five) minutes x 3 doses as needed for chest pain. 75 tablet 3  . pantoprazole (PROTONIX) 40 MG tablet Take 1 tablet (40 mg total) by mouth daily before breakfast.  90 tablet 3  . rivaroxaban (XARELTO) 20 MG TABS tablet Take 1 tablet (20 mg total) by mouth daily with supper. 90 tablet 3   Social History   Socioeconomic History  . Marital status: Married    Spouse name: Not on file  . Number of children: Not on file  . Years of education: Not on file  . Highest education level: Not on file  Occupational History  . Occupation: retired    Comment: [place] [employer]  Tobacco Use  . Smoking status: Former Smoker    Packs/day: 1.50    Years: 25.00    Pack years: 37.50    Types: Cigarettes    Start date: 08/08/1960    Quit date: 06/02/1982    Years since quitting: 38.3  . Smokeless tobacco: Never Used  . Tobacco comment: quit smoking 30+yrs ago  Vaping Use  . Vaping Use: Never used  Substance and Sexual Activity  . Alcohol use: No    Alcohol/week: 0.0 standard drinks    Comment: occasionally   . Drug use: No  . Sexual activity: Yes  Other Topics Concern  . Not on file  Social History Narrative  . Not on file   Social Determinants of Health   Financial Resource Strain: Not on file  Food Insecurity: Not on file   Transportation Needs: Not on file  Physical Activity: Not on file  Stress: Not on file  Social Connections: Not on file  Intimate Partner Violence: Not on file   Family History  Problem Relation Age of Onset  . Allergies Mother   . Heart disease Mother   . Hypertension Mother   . Heart disease Father        MI    OBJECTIVE: Vitals:   09/15/20 0826  BP: (!) 148/68  Pulse: 73  Resp: 18  Temp: 97.9 F (36.6 C)  TempSrc: Temporal  SpO2: 97%    General appearance: alert; no distress Head: NCAT Lungs: normal respiratory effort Extremities: no edema Skin: warm and dry; light erythema to LT lower lateral leg, mildly TTP, no obvious drainage or bleeding, apx 2-3 cm in diameter Psychological: alert and cooperative; normal mood and affect  ASSESSMENT & PLAN:  1. Cellulitis of left lower extremity     Meds ordered this encounter  Medications  . doxycycline (VIBRAMYCIN) 100 MG capsule    Sig: Take 1 capsule (100 mg total) by mouth 2 (two) times daily.    Dispense:  20 capsule    Refill:  0    Order Specific Question:   Supervising Provider    Answer:   Raylene Everts [[id number]]    Rest, ice, and elevation Prescribed doxycycline take as directed and to completion Continue to alternate tylenol as needed for pain and fever Follow up with PCP if symptoms persists Return or go to the ED if you have any new or worsening symptoms such as increased pain, redness, swelling, discharge, high fever, night sweats, abdominal pain, etc...   Reviewed expectations re: course of current medical issues. Questions answered. Outlined signs and symptoms indicating need for more acute intervention. Patient verbalized understanding. After Visit Summary given.   Lestine Box, PA-C 09/15/20 [phone]

## 2020-09-15 NOTE — Discharge Instructions (Signed)
Rest, ice, and elevation Prescribed doxycycline take as directed and to completion Continue to alternate tylenol as needed for pain and fever Follow up with PCP if symptoms persists Return or go to the ED if you have any new or worsening symptoms such as increased pain, redness, swelling, discharge, high fever, night sweats, abdominal pain, etc..Marland Kitchen

## 2020-09-20 DIAGNOSIS — N312 Flaccid neuropathic bladder, not elsewhere classified: Secondary | ICD-10-CM | POA: Diagnosis not present

## 2020-09-20 DIAGNOSIS — E291 Testicular hypofunction: Secondary | ICD-10-CM | POA: Diagnosis not present

## 2020-09-20 DIAGNOSIS — N200 Calculus of kidney: Secondary | ICD-10-CM | POA: Diagnosis not present

## 2020-09-20 DIAGNOSIS — N401 Enlarged prostate with lower urinary tract symptoms: Secondary | ICD-10-CM | POA: Diagnosis not present

## 2020-09-20 DIAGNOSIS — L03116 Cellulitis of left lower limb: Secondary | ICD-10-CM | POA: Diagnosis not present

## 2020-09-20 DIAGNOSIS — N529 Male erectile dysfunction, unspecified: Secondary | ICD-10-CM | POA: Diagnosis not present

## 2020-09-24 DIAGNOSIS — L03116 Cellulitis of left lower limb: Secondary | ICD-10-CM | POA: Diagnosis not present

## 2020-09-28 DIAGNOSIS — L03116 Cellulitis of left lower limb: Secondary | ICD-10-CM | POA: Diagnosis not present

## 2020-10-05 DIAGNOSIS — L03116 Cellulitis of left lower limb: Secondary | ICD-10-CM | POA: Diagnosis not present

## 2020-10-08 DIAGNOSIS — H43812 Vitreous degeneration, left eye: Secondary | ICD-10-CM | POA: Diagnosis not present

## 2020-10-08 DIAGNOSIS — H04123 Dry eye syndrome of bilateral lacrimal glands: Secondary | ICD-10-CM | POA: Diagnosis not present

## 2020-10-08 DIAGNOSIS — Z961 Presence of intraocular lens: Secondary | ICD-10-CM | POA: Diagnosis not present

## 2020-10-08 DIAGNOSIS — H35372 Puckering of macula, left eye: Secondary | ICD-10-CM | POA: Diagnosis not present

## 2020-10-09 ENCOUNTER — Ambulatory Visit (INDEPENDENT_AMBULATORY_CARE_PROVIDER_SITE_OTHER): Payer: Medicare Other | Admitting: General Surgery

## 2020-10-09 ENCOUNTER — Encounter: Payer: Self-pay | Admitting: General Surgery

## 2020-10-09 ENCOUNTER — Other Ambulatory Visit: Payer: Self-pay

## 2020-10-09 VITALS — BP 132/63 | HR 68 | Temp 98.3°F | Resp 15 | Ht 71.0 in | Wt 230.0 lb

## 2020-10-09 DIAGNOSIS — L03116 Cellulitis of left lower limb: Secondary | ICD-10-CM

## 2020-10-09 DIAGNOSIS — I25708 Atherosclerosis of coronary artery bypass graft(s), unspecified, with other forms of angina pectoris: Secondary | ICD-10-CM | POA: Diagnosis not present

## 2020-10-10 NOTE — Progress Notes (Signed)
[place]; [id number]; 30-Jul-1940   HPI Patient is a 80 year old white male who was referred to my care by Dr. Asencion Noble for evaluation and treatment of cellulitis with a possible abscess in the left lower leg.  Patient developed cellulitis several weeks ago.  He denies any trauma to that area.  He does have longstanding swelling of both legs and wear support hose.  He was on 2 regimens of antibiotics.  He was initially on doxycycline and is now on Keflex.  He has had no drainage from the wound.  It is not particularly tender.  He denies any fevers.  He states the redness and the area of swelling have decreased recently.  He is chronically on Xarelto. Past Medical History:  Diagnosis Date  . Arthritis   . Basal cell carcinoma   . Carotid stenosis   . Coronary artery disease    Multvessel s/p CABG 2015  . Enlarged prostate   . Essential hypertension   . GERD (gastroesophageal reflux disease)   . Gout   . History of colon polyps   . History of kidney stones   . Hyperlipidemia   . Lumbar disc disease   . Persistent atrial fibrillation ([place])   . Sleep apnea    CPAP    Past Surgical History:  Procedure Laterality Date  . ABLATION OF DYSRHYTHMIC FOCUS  07/28/2017  . ATRIAL FIBRILLATION ABLATION N/A 07/28/2017   Procedure: ATRIAL FIBRILLATION ABLATION;  Surgeon: Thompson Grayer, MD;  Location: [place] [facility];  Service: Cardiovascular;  Laterality: N/A;  . BACK SURGERY    . CARDIAC CATHETERIZATION  05/22/2014   Procedure: IABP INSERTION;  Surgeon: Leonie Man, MD;  Location: [facility] CATH LAB;  Service: Cardiovascular;;  . CARDIOVERSION N/A 04/29/2017   Procedure: CARDIOVERSION;  Surgeon: Satira Sark, MD;  [facility];  Service: Cardiovascular;  Laterality: N/A;  . CARDIOVERSION N/A 08/13/2017   Procedure: CARDIOVERSION;  Surgeon: Pixie Casino, MD;  Location: [facility] ENDOSCOPY;  Service: Cardiovascular;  Laterality: N/A;  . CARDIOVERSION N/A 08/15/2019   Procedure:  CARDIOVERSION;  Surgeon: Dorothy Spark, MD;  Location: [facility] ENDOSCOPY;  Service: Cardiovascular;  Laterality: N/A;  . CARDIOVERSION N/A 03/09/2020   Procedure: CARDIOVERSION;  Surgeon: Buford Dresser, MD;  Location: [place];  Service: Cardiovascular;  Laterality: N/A;  . CARDIOVERSION N/A 07/06/2020   Procedure: CARDIOVERSION;  Surgeon: Sanda Klein, MD;  Location: [facility] ENDOS[facility];  Service: Cardiovascular;  Laterality: N/A;  . Cataract surgery Right   . COLONOSCOPY    . COLONOSCOPY N/A 12/30/2017   Procedure: COLONOSCOPY;  Surgeon: Rogene Houston, MD;  [facility];  Service: Endoscopy;  Laterality: N/A;  . CORONARY ARTERY BYPASS GRAFT N/A 05/22/2014   Procedure: CORONARY ARTERY BYPASS GRAFTING (CABG) times three using left internal mammary and right saphenous vein.;  Surgeon: Melrose Nakayama, MD;  Location: [place];  Service: Open Heart Surgery;  Laterality: N/A;  . ENDARTERECTOMY Left 02/17/2014   Procedure: ENDARTERECTOMY CAROTID WITH PATCH ANGIOPLASTY;  Surgeon: Mal Misty, MD;  Location: [place];  Service: Vascular;  Laterality: Left;  . ESOPHAGEAL DILATION N/A 08/22/2015   Procedure: ESOPHAGEAL DILATION;  Surgeon: Rogene Houston, MD;  [facility];  Service: Endoscopy;  Laterality: N/A;  . ESOPHAGOGASTRODUODENOSCOPY N/A 08/22/2015   Procedure: ESOPHAGOGASTRODUODENOSCOPY (EGD);  Surgeon: Rogene Houston, MD;  [facility];  Service: Endoscopy;  Laterality: N/A;  12:45 - moved to 1:55 - Ann notified pt  . ESOPHAGOGASTRODUODENOSCOPY  N/A 12/30/2017   Procedure: ESOPHAGOGASTRODUODENOSCOPY (EGD);  Surgeon: Rogene Houston, MD;  [facility];  Service: Endoscopy;  Laterality: N/A;  200  . EYE SURGERY     cataract extraction (right) with repair macular tear , with IOL     right  . FRACTURE SURGERY     bilateral wrist fractures- one ORIF  . JOINT REPLACEMENT  2011   left knee  . LEFT HEART CATHETERIZATION WITH CORONARY ANGIOGRAM N/A  05/22/2014   Procedure: LEFT HEART CATHETERIZATION WITH CORONARY ANGIOGRAM;  Surgeon: Leonie Man, MD;  Location: [facility] CATH LAB;  Service: Cardiovascular;  Laterality: N/A;  . LITHOTRIPSY    . PARS PLANA VITRECTOMY W/ REPAIR OF MACULAR HOLE    . RHINOPLASTY    . TEE WITHOUT CARDIOVERSION N/A 04/29/2017   Procedure: TRANSESOPHAGEAL ECHOCARDIOGRAM (TEE) WITH PROPOFOL;  Surgeon: Satira Sark, MD;  [facility];  Service: Cardiovascular;  Laterality: N/A;  . TONSILLECTOMY    . TOTAL HIP ARTHROPLASTY  12/08/2011   Procedure: TOTAL HIP ARTHROPLASTY;  Surgeon: Gearlean Alf, MD;  Location: [facility];  Service: Orthopedics;  Laterality: Right;  . UPPER GI ENDOSCOPY  12/18/2015   Procedure: UPPER GI ENDOSCOPY;  Surgeon: Ralene Ok, MD;  Location: [facility];  Service: General;;  . WRIST SURGERY Right 51yrs ago  . WRIST SURGERY Left     Family History  Problem Relation Age of Onset  . Allergies Mother   . Heart disease Mother   . Hypertension Mother   . Heart disease Father        MI    Current Outpatient Medications on File Prior to Visit  Medication Sig Dispense Refill  . acetaminophen (TYLENOL) 500 MG tablet Take 1,000 mg by mouth as needed for moderate pain.    Marland Kitchen amoxicillin (AMOXIL) 500 MG capsule Take 2,000 mg by mouth once.    Marland Kitchen atorvastatin (LIPITOR) 80 MG tablet Take 1 tablet (80 mg total) by mouth every evening. 90 tablet 3  . colchicine 0.6 MG tablet Take 0.6 mg by mouth daily as needed (for gout flare up).    . Cyanocobalamin (B-12) 1000 MCG SUBL Place 1,000 mcg under the tongue daily.     . cyclobenzaprine (FLEXERIL) 10 MG tablet Take 10 mg by mouth 2 (two) times daily as needed for muscle spasms.    Marland Kitchen diltiazem (CARDIZEM CD) 120 MG 24 hr capsule Take 1 capsule (120 mg total) by mouth daily. 30 capsule 6  . doxazosin (CARDURA) 4 MG tablet Take 4 mg by mouth daily.  4  . doxycycline (VIBRAMYCIN) 100 MG capsule Take 1 capsule (100 mg total) by mouth 2 (two) times  daily. 20 capsule 0  . ELDERBERRY PO Take 50 mg by mouth daily. Gummie    . finasteride (PROSCAR) 5 MG tablet Take 5 mg by mouth every evening.     . furosemide (LASIX) 40 MG tablet Take 1 tablet (40 mg total) by mouth 2 (two) times daily. 180 tablet 0  . lisinopril (PRINIVIL,ZESTRIL) 40 MG tablet Take 40 mg by mouth daily.    . nitroGLYCERIN (NITROSTAT) 0.4 MG SL tablet Place 1 tablet (0.4 mg total) under the tongue every 5 (five) minutes x 3 doses as needed for chest pain. 75 tablet 3  . pantoprazole (PROTONIX) 40 MG tablet Take 1 tablet (40 mg total) by mouth daily before breakfast. 90 tablet 3  . rivaroxaban (XARELTO) 20 MG TABS tablet Take 1 tablet (20 mg total) by  mouth daily with supper. 90 tablet 3   No current facility-administered medications on file prior to visit.    Allergies  Allergen Reactions  . Codeine Sulfate Nausea Only    Social History   Substance and Sexual Activity  Alcohol Use No  . Alcohol/week: 0.0 standard drinks   Comment: occasionally     Social History   Tobacco Use  Smoking Status Former Smoker  . Packs/day: 1.50  . Years: 25.00  . Pack years: 37.50  . Types: Cigarettes  . Start date: 08/08/1960  . Quit date: 06/02/1982  . Years since quitting: 38.3  Smokeless Tobacco Never Used  Tobacco Comment   quit smoking 30+yrs ago    Review of Systems  Constitutional: Positive for chills.  HENT: Negative.   Eyes: Negative.   Respiratory: Negative.   Cardiovascular: Negative.   Gastrointestinal: Negative.   Genitourinary: Negative.   Musculoskeletal: Negative.   Skin: Negative.   Neurological: Negative.   Endo/Heme/Allergies: Bruises/bleeds easily.  Psychiatric/Behavioral: Negative.     Objective   Vitals:   10/09/20 1416  BP: 132/63  Pulse: 68  Resp: 15  Temp: 98.3 F (36.8 C)  SpO2: 94%    Physical Exam Vitals reviewed.  Constitutional:      Appearance: Normal appearance.  HENT:     Head: Normocephalic and atraumatic.   Cardiovascular:     Rate and Rhythm: Normal rate and regular rhythm.     Heart sounds: Normal heart sounds. No murmur heard. No friction rub. No gallop.   Pulmonary:     Effort: Pulmonary effort is normal. No respiratory distress.     Breath sounds: Normal breath sounds. No stridor. No wheezing, rhonchi or rales.  Musculoskeletal:     Comments: There is a 1.5 cm slightly raised indurated area in the left lower leg above the ankle laterally.  Mild erythema is noted.  Is difficult to fully assess fluctuance as the left lower leg is diffusely more swollen with pitting edema.  There is pitting edema on the right lower extremity.  Dorsalis pedis pulse is intact in the left foot.  No drainage is noted.  Dry skin present.  Skin:    General: Skin is dry.  Neurological:     Mental Status: He is alert and oriented to person, place, and time.   Primary care notes reviewed  Media Information         Document Information  Photos    10/09/2020 14:29  Attached To:  Office Visit on 10/09/20 with Aviva Signs, MD   Source Information  Aviva Signs, MD  [facility]    Assessment  Resolving cellulitis of left lower extremity.  No discrete abscess is noted.  As patient is anticoagulated, will try to avoid any invasive procedure (I&D) at this time.  Patient understands and agrees. Plan   Patient should finish his course of Keflex.  He should resume using the knee-high TED hose.  Should apply skin lotion to the lower extremities.  I will see him in 1 week for follow-up.

## 2020-10-15 ENCOUNTER — Telehealth (HOSPITAL_COMMUNITY): Payer: Self-pay | Admitting: *Deleted

## 2020-10-15 NOTE — Telephone Encounter (Signed)
Pt called in to report he has been back in AF since at least Thursday - HRs have been in the 120-140s according to his apple watch. BP 120/72. He feels tired but no CP.  Discussed with Adline Peals PA will increase cardizem 120mg  BID and follow up in clinic. Pt wife out of town therefore appt made for later in the week when he has transportation.

## 2020-10-16 ENCOUNTER — Ambulatory Visit: Payer: Medicare Other | Admitting: General Surgery

## 2020-10-17 ENCOUNTER — Ambulatory Visit (HOSPITAL_COMMUNITY)
Admission: RE | Admit: 2020-10-17 | Discharge: 2020-10-17 | Disposition: A | Discharge status: Home / Self Care | Payer: Medicare Other | Source: Ambulatory Visit | Attending: Nurse Practitioner | Admitting: Nurse Practitioner

## 2020-10-17 ENCOUNTER — Other Ambulatory Visit: Payer: Self-pay

## 2020-10-17 VITALS — BP 130/52 | HR 100 | Ht 71.0 in | Wt 228.0 lb

## 2020-10-17 DIAGNOSIS — Z7901 Long term (current) use of anticoagulants: Secondary | ICD-10-CM | POA: Insufficient documentation

## 2020-10-17 DIAGNOSIS — D6869 Other thrombophilia: Secondary | ICD-10-CM | POA: Diagnosis not present

## 2020-10-17 DIAGNOSIS — I4819 Other persistent atrial fibrillation: Secondary | ICD-10-CM | POA: Insufficient documentation

## 2020-10-17 DIAGNOSIS — Z87891 Personal history of nicotine dependence: Secondary | ICD-10-CM | POA: Insufficient documentation

## 2020-10-17 DIAGNOSIS — Z8249 Family history of ischemic heart disease and other diseases of the circulatory system: Secondary | ICD-10-CM | POA: Diagnosis not present

## 2020-10-17 DIAGNOSIS — Z79899 Other long term (current) drug therapy: Secondary | ICD-10-CM | POA: Diagnosis not present

## 2020-10-17 NOTE — Addendum Note (Signed)
Encounter addended by: Sherran Needs, NP on: 10/17/2020 12:43 PM  Actions taken: Clinical Note Signed

## 2020-10-17 NOTE — Progress Notes (Addendum)
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 male with a h/o afib, s/p ablation 2019. He had a cardioversion in October  and recently on f/u with [name] 06/04/20 he was in [place].  He had a back injection the next day and restarted blood thinner 1/4. He started back in afib on 1/6. He is taking cardizem 30 mg every 4 hours with reasonable rate control.   F/u, 07/13/20,  successful cardioversion 2/4. He did have frequent PAC's after the procedure but is in SR today. He continues the 30 mg Cardizem q 6 hours. I will change to 120 mg daily.   F/u in afib clinic, 10/17/20, as he went into afib earlier in the week. Only trigger may have been cellulitis of left leg but he is at the end of antibiotics and is much improved. He is rate controlled for the most part and is symptomatic with fatigue in afib. On last visit, we discussed if increase in afib burden he may need repeat ablation and he stated that [name] has also mentioned this as well. He would like to go back to him to discuss. He has been treated with amiodarone in the past but would  prefer to not  restart as for the S.E.  Profile associated with drug.   Today, he denies symptoms of palpitations, chest pain, shortness of breath, orthopnea, PND, lower extremity edema, dizziness, presyncope, syncope, or neurologic sequela. The patient is tolerating medications without difficulties and is otherwise without complaint today.   Past Medical History:  Diagnosis Date  . Arthritis   . Basal cell carcinoma   . Carotid stenosis   . Coronary artery disease    Multvessel s/p CABG 2015  . Enlarged prostate   . Essential hypertension   . GERD (gastroesophageal reflux disease)   . Gout   . History of colon polyps   . History of kidney stones   . Hyperlipidemia   . Lumbar disc disease   . Persistent atrial fibrillation ([place])   . Sleep apnea    CPAP   Past Surgical History:  Procedure  Laterality Date  . ABLATION OF DYSRHYTHMIC FOCUS  07/28/2017  . ATRIAL FIBRILLATION ABLATION N/A 07/28/2017   Procedure: ATRIAL FIBRILLATION ABLATION;  Surgeon: Thompson Grayer, MD;  Location: [place] CV LAB;  Service: Cardiovascular;  Laterality: N/A;  . BACK SURGERY    . CARDIAC CATHETERIZATION  05/22/2014   Procedure: IABP INSERTION;  Surgeon: Leonie Man, MD;  Location: [facility] CATH LAB;  Service: Cardiovascular;;  . CARDIOVERSION N/A 04/29/2017   Procedure: CARDIOVERSION;  Surgeon: Satira Sark, MD;  [facility];  Service: Cardiovascular;  Laterality: N/A;  . CARDIOVERSION N/A 08/13/2017   Procedure: CARDIOVERSION;  Surgeon: Pixie Casino, MD;  Location: [facility] ENDOSCOPY;  Service: Cardiovascular;  Laterality: N/A;  . CARDIOVERSION N/A 08/15/2019   Procedure: CARDIOVERSION;  Surgeon: Dorothy Spark, MD;  Location: [facility] ENDOSCOPY;  Service: Cardiovascular;  Laterality: N/A;  . CARDIOVERSION N/A 03/09/2020   Procedure: CARDIOVERSION;  Surgeon: Buford Dresser, MD;  Location: [place];  Service: Cardiovascular;  Laterality: N/A;  . CARDIOVERSION N/A 07/06/2020   Procedure: CARDIOVERSION;  Surgeon: Sanda Klein, MD;  Location: [facility] ENDOSCOPY;  Service: Cardiovascular;  Laterality: N/A;  . Cataract surgery Right   . COLONOSCOPY    . COLONOSCOPY N/A 12/30/2017   Procedure: COLONOSCOPY;  Surgeon: Rogene Houston, MD;  [facility];  Service: Endoscopy;  Laterality: N/A;  . CORONARY ARTERY BYPASS GRAFT N/A 05/22/2014   Procedure: CORONARY ARTERY BYPASS GRAFTING (CABG) times three using left internal mammary and right saphenous vein.;  Surgeon: Melrose Nakayama, MD;  Location: [place];  Service: Open Heart Surgery;  Laterality: N/A;  . ENDARTERECTOMY Left 02/17/2014   Procedure: ENDARTERECTOMY CAROTID WITH PATCH ANGIOPLASTY;  Surgeon: Mal Misty, MD;  Location: [place];  Service: Vascular;  Laterality: Left;  . ESOPHAGEAL DILATION N/A 08/22/2015    Procedure: ESOPHAGEAL DILATION;  Surgeon: Rogene Houston, MD;  [facility];  Service: Endoscopy;  Laterality: N/A;  . ESOPHAGOGASTRODUODENOSCOPY N/A 08/22/2015   Procedure: ESOPHAGOGASTRODUODENOSCOPY (EGD);  Surgeon: Rogene Houston, MD;  [facility];  Service: Endoscopy;  Laterality: N/A;  12:45 - moved to 1:55 - Ann notified pt  . ESOPHAGOGASTRODUODENOSCOPY N/A 12/30/2017   Procedure: ESOPHAGOGASTRODUODENOSCOPY (EGD);  Surgeon: Rogene Houston, MD;  [facility];  Service: Endoscopy;  Laterality: N/A;  200  . EYE SURGERY     cataract extraction (right) with repair macular tear , with IOL     right  . FRACTURE SURGERY     bilateral wrist fractures- one ORIF  . JOINT REPLACEMENT  2011   left knee  . LEFT HEART CATHETERIZATION WITH CORONARY ANGIOGRAM N/A 05/22/2014   Procedure: LEFT HEART CATHETERIZATION WITH CORONARY ANGIOGRAM;  Surgeon: Leonie Man, MD;  Location: [facility] CATH LAB;  Service: Cardiovascular;  Laterality: N/A;  . LITHOTRIPSY    . PARS PLANA VITRECTOMY W/ REPAIR OF MACULAR HOLE    . RHINOPLASTY    . TEE WITHOUT CARDIOVERSION N/A 04/29/2017   Procedure: TRANSESOPHAGEAL ECHOCARDIOGRAM (TEE) WITH PROPOFOL;  Surgeon: Satira Sark, MD;  [facility];  Service: Cardiovascular;  Laterality: N/A;  . TONSILLECTOMY    . TOTAL HIP ARTHROPLASTY  12/08/2011   Procedure: TOTAL HIP ARTHROPLASTY;  Surgeon: Gearlean Alf, MD;  Location: [facility];  Service: Orthopedics;  Laterality: Right;  . UPPER GI ENDOSCOPY  12/18/2015   Procedure: UPPER GI ENDOSCOPY;  Surgeon: Ralene Ok, MD;  Location: [facility] ORS;  Service: General;;  . WRIST SURGERY Right 31yrs ago  . WRIST SURGERY Left     Current Outpatient Medications  Medication Sig Dispense Refill  . acetaminophen (TYLENOL) 500 MG tablet Take 1,000 mg by mouth as needed for moderate pain.    Marland Kitchen amoxicillin (AMOXIL) 500 MG capsule Take 2,000 mg by mouth once.    Marland Kitchen atorvastatin (LIPITOR) 80 MG  tablet Take 1 tablet (80 mg total) by mouth every evening. 90 tablet 3  . colchicine 0.6 MG tablet Take 0.6 mg by mouth daily as needed (for gout flare up).    . Cyanocobalamin (B-12) 1000 MCG SUBL Place 1,000 mcg under the tongue daily.     . cyclobenzaprine (FLEXERIL) 10 MG tablet Take 10 mg by mouth 2 (two) times daily as needed for muscle spasms.    Marland Kitchen diltiazem (CARDIZEM CD) 120 MG 24 hr capsule Take 1 capsule (120 mg total) by mouth daily. 30 capsule 6  . doxazosin (CARDURA) 4 MG tablet Take 4 mg by mouth daily.  4  . doxycycline (VIBRAMYCIN) 100 MG capsule Take 1 capsule (100 mg total) by mouth 2 (two) times daily. 20 capsule 0  . ELDERBERRY PO Take 50 mg by mouth daily. Gummie    . finasteride (PROSCAR) 5 MG tablet Take 5 mg by mouth every evening.     . furosemide (LASIX)  40 MG tablet Take 1 tablet (40 mg total) by mouth 2 (two) times daily. 180 tablet 0  . lisinopril (PRINIVIL,ZESTRIL) 40 MG tablet Take 40 mg by mouth daily.    . nitroGLYCERIN (NITROSTAT) 0.4 MG SL tablet Place 1 tablet (0.4 mg total) under the tongue every 5 (five) minutes x 3 doses as needed for chest pain. 75 tablet 3  . pantoprazole (PROTONIX) 40 MG tablet Take 1 tablet (40 mg total) by mouth daily before breakfast. 90 tablet 3  . rivaroxaban (XARELTO) 20 MG TABS tablet Take 1 tablet (20 mg total) by mouth daily with supper. 90 tablet 3   No current facility-administered medications for this encounter.    Allergies  Allergen Reactions  . Codeine Sulfate Nausea Only    Social History   Socioeconomic History  . Marital status: Married    Spouse name: Not on file  . Number of children: Not on file  . Years of education: Not on file  . Highest education level: Not on file  Occupational History  . Occupation: retired    Comment: [place] [employer]  Tobacco Use  . Smoking status: Former Smoker    Packs/day: 1.50    Years: 25.00    Pack years: 37.50    Types: Cigarettes    Start date: 08/08/1960     Quit date: 06/02/1982    Years since quitting: 38.4  . Smokeless tobacco: Never Used  . Tobacco comment: quit smoking 30+yrs ago  Vaping Use  . Vaping Use: Never used  Substance and Sexual Activity  . Alcohol use: No    Alcohol/week: 0.0 standard drinks    Comment: occasionally   . Drug use: No  . Sexual activity: Yes  Other Topics Concern  . Not on file  Social History Narrative  . Not on file   Social Determinants of Health   Financial Resource Strain: Not on file  Food Insecurity: Not on file  Transportation Needs: Not on file  Physical Activity: Not on file  Stress: Not on file  Social Connections: Not on file  Intimate Partner Violence: Not on file    Family History  Problem Relation Age of Onset  . Allergies Mother   . Heart disease Mother   . Hypertension Mother   . Heart disease Father        MI    ROS- All systems are reviewed and negative except as per the HPI above  Physical Exam: There were no vitals filed for this visit. Wt Readings from Last 3 Encounters:  10/09/20 104.3 kg  08/23/20 106 kg  07/13/20 103.9 kg    Labs: Lab Results  Component Value Date   NA 140 07/06/2020   K 4.4 07/06/2020   CL 106 07/06/2020   CO2 23 06/19/2020   GLUCOSE 89 07/06/2020   BUN 28 (H) 07/06/2020   CREATININE 1.30 (H) 07/06/2020   CALCIUM 8.8 (L) 06/19/2020   PHOS 3.3 04/29/2017   MG 1.9 04/30/2017   Lab Results  Component Value Date   INR 2.2 (H) 08/15/2019   Lab Results  Component Value Date   CHOL 134 03/22/2015   HDL 41 03/22/2015   LDLCALC 75 03/22/2015   TRIG 92 03/22/2015     GEN- The patient is well appearing, alert and oriented x 3 today.   Head- normocephalic, atraumatic Eyes-  Sclera clear, conjunctiva pink Ears- hearing intact Oropharynx- clear Neck- supple, no JVP Lymph- no cervical lymphadenopathy Lungs- Clear to ausculation bilaterally, normal  work of breathing Heart- irregular rate and rhythm, no murmurs, rubs or gallops, PMI  not laterally displaced GI- soft, NT, ND, + BS Extremities- no clubbing, cyanosis, or edema MS- no significant deformity or atrophy Skin- no rash or lesion Psych- euthymic mood, full affect Neuro- strength and sensation are intact  EKG-   afib at 100 bpm, qrs int 94 ms, qtc 448 ms     Assessment and Plan: 1. Afib  Successful  cardioversion in  March 2021 and again 03/09/20 Recent  steroid back injection may have triggered afib in January Successful cardioversion 06/19/2020 Now back in afib Will refer back to [name] for repeat ablation, pt states that he would be ok to just have it scheduled vrs seeing him in the office to schedule Continue 120 mg cardizem daily and 30 mg cardizem if needed for RVR  2. CHA2DS2VASc score of 6 Continue  xarelto 20 mg daily  Reminded  not to miss any doses for pending abaltion     Butch Penny C. Ondre Salvetti, [facility] [address] [place], [place] [address] [phone]

## 2020-10-22 ENCOUNTER — Telehealth: Payer: Self-pay | Admitting: *Deleted

## 2020-10-22 DIAGNOSIS — I4891 Unspecified atrial fibrillation: Secondary | ICD-10-CM

## 2020-10-22 NOTE — Telephone Encounter (Signed)
Set up ablation date July 5. Patient verbalized understanding and agreement.

## 2020-11-02 ENCOUNTER — Encounter: Payer: Self-pay | Admitting: *Deleted

## 2020-11-02 NOTE — Addendum Note (Signed)
Addended by: Darrell Jewel on: 11/02/2020 03:09 PM   Modules accepted: Orders

## 2020-11-05 ENCOUNTER — Telehealth: Payer: Self-pay | Admitting: Internal Medicine

## 2020-11-05 NOTE — Telephone Encounter (Signed)
The patient and his wife were going on a train trip July 14,15,16. Stating she already canceled it. She thought it would be too soon for the two of them. Advised his restrictions are for a week post op. Advised the wife it is okay that she planned it for Septmeber.

## 2020-11-05 NOTE — Telephone Encounter (Signed)
New message   Pt wife would like to know if pt can or can not travel after his ablation but before his 4 week f/u at the afib clinic

## 2020-11-16 ENCOUNTER — Other Ambulatory Visit: Payer: Self-pay

## 2020-11-16 ENCOUNTER — Encounter: Payer: Self-pay | Admitting: Internal Medicine

## 2020-11-16 ENCOUNTER — Ambulatory Visit (INDEPENDENT_AMBULATORY_CARE_PROVIDER_SITE_OTHER): Payer: Medicare Other | Admitting: Internal Medicine

## 2020-11-16 VITALS — BP 114/62 | HR 113 | Ht 70.5 in | Wt 230.0 lb

## 2020-11-16 DIAGNOSIS — Z951 Presence of aortocoronary bypass graft: Secondary | ICD-10-CM | POA: Diagnosis not present

## 2020-11-16 DIAGNOSIS — G4733 Obstructive sleep apnea (adult) (pediatric): Secondary | ICD-10-CM | POA: Diagnosis not present

## 2020-11-16 DIAGNOSIS — I10 Essential (primary) hypertension: Secondary | ICD-10-CM | POA: Diagnosis not present

## 2020-11-16 DIAGNOSIS — I2510 Atherosclerotic heart disease of native coronary artery without angina pectoris: Secondary | ICD-10-CM

## 2020-11-16 DIAGNOSIS — I4819 Other persistent atrial fibrillation: Secondary | ICD-10-CM

## 2020-11-16 MED ORDER — DILTIAZEM HCL ER COATED BEADS 240 MG PO CP24: 240.0000 mg | ORAL_CAPSULE | Freq: Every day | ORAL | 3 refills | Status: DC

## 2020-11-16 NOTE — Progress Notes (Signed)
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  Since last being seen in our clinic, the patient reports doing ok.  He is in afib/  + fatigue and SOB.  Today, he denies symptoms of palpitations, chest pain,   lower extremity edema, dizziness, presyncope, or syncope.  The patient is otherwise without complaint today.   Past Medical History:  Diagnosis Date   Arthritis    Basal cell carcinoma    Carotid stenosis    Coronary artery disease    Multvessel s/p CABG 2015   Enlarged prostate    Essential hypertension    GERD (gastroesophageal reflux disease)    Gout    History of colon polyps    History of kidney stones    Hyperlipidemia    Lumbar disc disease    Persistent atrial fibrillation ([facility])    Sleep apnea    CPAP   Past Surgical History:  Procedure Laterality Date   ABLATION OF DYSRHYTHMIC FOCUS  07/28/2017   ATRIAL FIBRILLATION ABLATION N/A 07/28/2017   Procedure: ATRIAL FIBRILLATION ABLATION;  Surgeon: Thompson Grayer, MD;  Location: [place] CV LAB;  Service: Cardiovascular;  Laterality: N/A;   BACK SURGERY     CARDIAC CATHETERIZATION  05/22/2014   Procedure: IABP INSERTION;  Surgeon: Leonie Man, MD;  Location: [facility] CATH LAB;  Service: Cardiovascular;;   CARDIOVERSION N/A 04/29/2017   Procedure: CARDIOVERSION;  Surgeon: Satira Sark, MD;  [facility];  Service: Cardiovascular;  Laterality: N/A;   CARDIOVERSION N/A 08/13/2017   Procedure: CARDIOVERSION;  Surgeon: Pixie Casino, MD;  Location: [facility] ENDOSCOPY;  Service: Cardiovascular;  Laterality: N/A;   CARDIOVERSION N/A 08/15/2019   Procedure: CARDIOVERSION;  Surgeon: Dorothy Spark, MD;  Location: [facility] ENDOSCOPY;  Service: Cardiovascular;  Laterality: N/A;   CARDIOVERSION N/A 03/09/2020   Procedure: CARDIOVERSION;  Surgeon: Buford Dresser, MD;  Location: [place];  Service: Cardiovascular;   Laterality: N/A;   CARDIOVERSION N/A 07/06/2020   Procedure: CARDIOVERSION;  Surgeon: Sanda Klein, MD;  Location: [place];  Service: Cardiovascular;  Laterality: N/A;   Cataract surgery Right    COLONOSCOPY     COLONOSCOPY N/A 12/30/2017   Procedure: COLONOSCOPY;  Surgeon: Rogene Houston, MD;  [facility];  Service: Endoscopy;  Laterality: N/A;   CORONARY ARTERY BYPASS GRAFT N/A 05/22/2014   Procedure: CORONARY ARTERY BYPASS GRAFTING (CABG) times three using left internal mammary and right saphenous vein.;  Surgeon: Melrose Nakayama, MD;  Location: [place];  Service: Open Heart Surgery;  Laterality: N/A;   ENDARTERECTOMY Left 02/17/2014   Procedure: ENDARTERECTOMY CAROTID WITH PATCH ANGIOPLASTY;  Surgeon: Mal Misty, MD;  Location: [place];  Service: Vascular;  Laterality: Left;   ESOPHAGEAL DILATION N/A 08/22/2015   Procedure: ESOPHAGEAL DILATION;  Surgeon: Rogene Houston, MD;  [facility];  Service: Endoscopy;  Laterality: N/A;   ESOPHAGOGASTRODUODENOSCOPY N/A 08/22/2015   Procedure: ESOPHAGOGASTRODUODENOSCOPY (EGD);  Surgeon: Rogene Houston, MD;  [facility];  Service: Endoscopy;  Laterality: N/A;  12:45 - moved to 1:55 - Ann notified pt   ESOPHAGOGASTRODUODENOSCOPY N/A 12/30/2017   Procedure: ESOPHAGOGASTRODUODENOSCOPY (EGD);  Surgeon: Rogene Houston, MD;  [facility];  Service: Endoscopy;  Laterality: N/A;  200   EYE SURGERY     cataract extraction (right) with repair macular tear , with IOL  right   FRACTURE SURGERY     bilateral wrist fractures- one ORIF   JOINT REPLACEMENT  2011   left knee   LEFT HEART CATHETERIZATION WITH CORONARY ANGIOGRAM N/A 05/22/2014   Procedure: LEFT HEART CATHETERIZATION WITH CORONARY ANGIOGRAM;  Surgeon: Leonie Man, MD;  Location: [facility] CATH LAB;  Service: Cardiovascular;  Laterality: N/A;   LITHOTRIPSY     PARS PLANA VITRECTOMY W/ REPAIR OF MACULAR HOLE     RHINOPLASTY     TEE WITHOUT  CARDIOVERSION N/A 04/29/2017   Procedure: TRANSESOPHAGEAL ECHOCARDIOGRAM (TEE) WITH PROPOFOL;  Surgeon: Satira Sark, MD;  [facility];  Service: Cardiovascular;  Laterality: N/A;   TONSILLECTOMY     TOTAL HIP ARTHROPLASTY  12/08/2011   Procedure: TOTAL HIP ARTHROPLASTY;  Surgeon: Gearlean Alf, MD;  [facility]: [facility];  Service: Orthopedics;  Laterality: Right;   UPPER GI ENDOSCOPY  12/18/2015   Procedure: UPPER GI ENDOSCOPY;  Surgeon: Ralene Ok, MD;  Location: [facility];  Service: General;;   WRIST SURGERY Right 29yrs ago   WRIST SURGERY Left     ROS- all systems are reviewed and negatives except as per HPI above  Current Outpatient Medications  Medication Sig Dispense Refill   acetaminophen (TYLENOL) 500 MG tablet Take 1,000 mg by mouth as needed for moderate pain.     amoxicillin (AMOXIL) 500 MG capsule Take 2,000 mg by mouth once.     atorvastatin (LIPITOR) 80 MG tablet Take 1 tablet (80 mg total) by mouth every evening. 90 tablet 3   colchicine 0.6 MG tablet Take 0.6 mg by mouth daily as needed (for gout flare up).     Cyanocobalamin (B-12) 1000 MCG SUBL Place 1,000 mcg under the tongue daily.      cyclobenzaprine (FLEXERIL) 10 MG tablet Take 10 mg by mouth 2 (two) times daily as needed for muscle spasms.     diltiazem (CARDIZEM CD) 120 MG 24 hr capsule Take 1 capsule (120 mg total) by mouth daily. 30 capsule 6   doxazosin (CARDURA) 4 MG tablet Take 4 mg by mouth daily.  4   ELDERBERRY PO Take 50 mg by mouth daily. Gummie     finasteride (PROSCAR) 5 MG tablet Take 5 mg by mouth every evening.      furosemide (LASIX) 40 MG tablet Take 1 tablet (40 mg total) by mouth 2 (two) times daily. 180 tablet 0   lisinopril (PRINIVIL,ZESTRIL) 40 MG tablet Take 40 mg by mouth daily.     nitroGLYCERIN (NITROSTAT) 0.4 MG SL tablet Place 1 tablet (0.4 mg total) under the tongue every 5 (five) minutes x 3 doses as needed for chest pain. 75 tablet 3   pantoprazole (PROTONIX) 40 MG  tablet Take 1 tablet (40 mg total) by mouth daily before breakfast. 90 tablet 3   rivaroxaban (XARELTO) 20 MG TABS tablet Take 1 tablet (20 mg total) by mouth daily with supper. 90 tablet 3   No current facility-administered medications for this visit.    Physical Exam: Vitals:   11/16/20 1349  BP: 114/62  Pulse: (!) 113  SpO2: 95%  Weight: 230 lb (104.3 kg)  Height: 5' 10.5" (1.791 m)    GEN- The patient is well appearing, alert and oriented x 3 today.   Head- normocephalic, atraumatic Eyes-  Sclera clear, conjunctiva pink Ears- hearing intact Oropharynx- clear Lungs- Clear to ausculation bilaterally, normal work of breathing Heart- Regular rate and rhythm, no murmurs, rubs or gallops, PMI not laterally displaced  GI- soft, NT, ND, + BS Extremities- no clubbing, cyanosis, or edema  Wt Readings from Last 3 Encounters:  11/16/20 230 lb (104.3 kg)  10/17/20 228 lb (103.4 kg)  10/09/20 230 lb (104.3 kg)    EKG tracing ordered today is personally reviewed and shows afib with RVR  Assessment and Plan:  Persistent afib He is having refractory afib The patient has symptomatic, recurrent  atrial fibrillation. He is s/p ablation in 2019. Chads2vasc score is 6.  he is anticoagulated with xarelto . Therapeutic strategies for afib including medicine and repeat ablation were discussed in detail with the patient today. Risk, benefits, and alternatives to EP study and radiofrequency ablation for afib were also discussed in detail today. These risks include but are not limited to stroke, bleeding, vascular damage, tamponade, perforation, damage to the esophagus, lungs, and other structures, pulmonary vein stenosis, worsening renal function, and death. The patient understands these risk and wishes to proceed.  We will therefore proceed with catheter ablation at the next available time.  Carto, ICE, anesthesia are requested for the procedure.  Will also obtain cardiac CT prior to the procedure  to exclude LAA thrombus and further evaluate atrial anatomy.  Increase diltiazem to 240mg  daily today  2. HTN Stable No change required today  3. CAD s/p CABG Stable No change required today  4. OSA Uses CPAP  Thompson Grayer MD, [facility] 11/16/2020 2:07 PM

## 2020-11-16 NOTE — H&P (View-Only) (Signed)
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  Since last being seen in our clinic, the patient reports doing ok.  He is in afib/  + fatigue and SOB.  Today, he denies symptoms of palpitations, chest pain,   lower extremity edema, dizziness, presyncope, or syncope.  The patient is otherwise without complaint today.   Past Medical History:  Diagnosis Date   Arthritis    Basal cell carcinoma    Carotid stenosis    Coronary artery disease    Multvessel s/p CABG 2015   Enlarged prostate    Essential hypertension    GERD (gastroesophageal reflux disease)    Gout    History of colon polyps    History of kidney stones    Hyperlipidemia    Lumbar disc disease    Persistent atrial fibrillation (HCC)    Sleep apnea    CPAP   Past Surgical History:  Procedure Laterality Date   ABLATION OF DYSRHYTHMIC FOCUS  07/28/2017   ATRIAL FIBRILLATION ABLATION N/A 07/28/2017   Procedure: ATRIAL FIBRILLATION ABLATION;  Surgeon: Thompson Grayer, MD;  Location: [place] [facility];  Service: Cardiovascular;  Laterality: N/A;   BACK SURGERY     CARDIAC CATHETERIZATION  05/22/2014   Procedure: IABP INSERTION;  Surgeon: Leonie Man, MD;  Location: [facility] CATH LAB;  Service: Cardiovascular;;   CARDIOVERSION N/A 04/29/2017   Procedure: CARDIOVERSION;  Surgeon: Satira Sark, MD;  [facility];  Service: Cardiovascular;  Laterality: N/A;   CARDIOVERSION N/A 08/13/2017   Procedure: CARDIOVERSION;  Surgeon: Pixie Casino, MD;  Location: [facility] ENDOSCOPY;  Service: Cardiovascular;  Laterality: N/A;   CARDIOVERSION N/A 08/15/2019   Procedure: CARDIOVERSION;  Surgeon: Dorothy Spark, MD;  Location: [facility] ENDOSCOPY;  Service: Cardiovascular;  Laterality: N/A;   CARDIOVERSION N/A 03/09/2020   Procedure: CARDIOVERSION;  Surgeon: Buford Dresser, MD;  Location: [place];  Service: Cardiovascular;   Laterality: N/A;   CARDIOVERSION N/A 07/06/2020   Procedure: CARDIOVERSION;  Surgeon: Sanda Klein, MD;  Location: [place];  Service: Cardiovascular;  Laterality: N/A;   Cataract surgery Right    COLONOSCOPY     COLONOSCOPY N/A 12/30/2017   Procedure: COLONOSCOPY;  Surgeon: Rogene Houston, MD;  [facility];  Service: Endoscopy;  Laterality: N/A;   CORONARY ARTERY BYPASS GRAFT N/A 05/22/2014   Procedure: CORONARY ARTERY BYPASS GRAFTING (CABG) times three using left internal mammary and right saphenous vein.;  Surgeon: Melrose Nakayama, MD;  Location: [place];  Service: Open Heart Surgery;  Laterality: N/A;   ENDARTERECTOMY Left 02/17/2014   Procedure: ENDARTERECTOMY CAROTID WITH PATCH ANGIOPLASTY;  Surgeon: Mal Misty, MD;  Location: [place];  Service: Vascular;  Laterality: Left;   ESOPHAGEAL DILATION N/A 08/22/2015   Procedure: ESOPHAGEAL DILATION;  Surgeon: Rogene Houston, MD;  [facility];  Service: Endoscopy;  Laterality: N/A;   ESOPHAGOGASTRODUODENOSCOPY N/A 08/22/2015   Procedure: ESOPHAGOGASTRODUODENOSCOPY (EGD);  Surgeon: Rogene Houston, MD;  [facility];  Service: Endoscopy;  Laterality: N/A;  12:45 - moved to 1:55 - Ann notified pt   ESOPHAGOGASTRODUODENOSCOPY N/A 12/30/2017   Procedure: ESOPHAGOGASTRODUODENOSCOPY (EGD);  Surgeon: Rogene Houston, MD;  [facility];  Service: Endoscopy;  Laterality: N/A;  200   EYE SURGERY     cataract extraction (right) with repair macular tear , with IOL  right   FRACTURE SURGERY     bilateral wrist fractures- one ORIF   JOINT REPLACEMENT  2011   left knee   LEFT HEART CATHETERIZATION WITH CORONARY ANGIOGRAM N/A 05/22/2014   Procedure: LEFT HEART CATHETERIZATION WITH CORONARY ANGIOGRAM;  Surgeon: Leonie Man, MD;  Location: [facility] CATH LAB;  Service: Cardiovascular;  Laterality: N/A;   LITHOTRIPSY     PARS PLANA VITRECTOMY W/ REPAIR OF MACULAR HOLE     RHINOPLASTY     TEE WITHOUT  CARDIOVERSION N/A 04/29/2017   Procedure: TRANSESOPHAGEAL ECHOCARDIOGRAM (TEE) WITH PROPOFOL;  Surgeon: Satira Sark, MD;  [facility];  Service: Cardiovascular;  Laterality: N/A;   TONSILLECTOMY     TOTAL HIP ARTHROPLASTY  12/08/2011   Procedure: TOTAL HIP ARTHROPLASTY;  Surgeon: Gearlean Alf, MD;  Location: [facility] ORS;  Service: Orthopedics;  Laterality: Right;   UPPER GI ENDOSCOPY  12/18/2015   Procedure: UPPER GI ENDOSCOPY;  Surgeon: Ralene Ok, MD;  Location: [facility] ORS;  Service: General;;   WRIST SURGERY Right 70yrs ago   WRIST SURGERY Left     ROS- all systems are reviewed and negatives except as per HPI above  Current Outpatient Medications  Medication Sig Dispense Refill   acetaminophen (TYLENOL) 500 MG tablet Take 1,000 mg by mouth as needed for moderate pain.     amoxicillin (AMOXIL) 500 MG capsule Take 2,000 mg by mouth once.     atorvastatin (LIPITOR) 80 MG tablet Take 1 tablet (80 mg total) by mouth every evening. 90 tablet 3   colchicine 0.6 MG tablet Take 0.6 mg by mouth daily as needed (for gout flare up).     Cyanocobalamin (B-12) 1000 MCG SUBL Place 1,000 mcg under the tongue daily.      cyclobenzaprine (FLEXERIL) 10 MG tablet Take 10 mg by mouth 2 (two) times daily as needed for muscle spasms.     diltiazem (CARDIZEM CD) 120 MG 24 hr capsule Take 1 capsule (120 mg total) by mouth daily. 30 capsule 6   doxazosin (CARDURA) 4 MG tablet Take 4 mg by mouth daily.  4   ELDERBERRY PO Take 50 mg by mouth daily. Gummie     finasteride (PROSCAR) 5 MG tablet Take 5 mg by mouth every evening.      furosemide (LASIX) 40 MG tablet Take 1 tablet (40 mg total) by mouth 2 (two) times daily. 180 tablet 0   lisinopril (PRINIVIL,ZESTRIL) 40 MG tablet Take 40 mg by mouth daily.     nitroGLYCERIN (NITROSTAT) 0.4 MG SL tablet Place 1 tablet (0.4 mg total) under the tongue every 5 (five) minutes x 3 doses as needed for chest pain. 75 tablet 3   pantoprazole (PROTONIX) 40 MG  tablet Take 1 tablet (40 mg total) by mouth daily before breakfast. 90 tablet 3   rivaroxaban (XARELTO) 20 MG TABS tablet Take 1 tablet (20 mg total) by mouth daily with supper. 90 tablet 3   No current facility-administered medications for this visit.    Physical Exam: Vitals:   11/16/20 1349  BP: 114/62  Pulse: (!) 113  SpO2: 95%  Weight: 230 lb (104.3 kg)  Height: 5' 10.5" (1.791 m)    GEN- The patient is well appearing, alert and oriented x 3 today.   Head- normocephalic, atraumatic Eyes-  Sclera clear, conjunctiva pink Ears- hearing intact Oropharynx- clear Lungs- Clear to ausculation bilaterally, normal work of breathing Heart- Regular rate and rhythm, no murmurs, rubs or gallops, PMI not laterally displaced  GI- soft, NT, ND, + BS Extremities- no clubbing, cyanosis, or edema  Wt Readings from Last 3 Encounters:  11/16/20 230 lb (104.3 kg)  10/17/20 228 lb (103.4 kg)  10/09/20 230 lb (104.3 kg)    EKG tracing ordered today is personally reviewed and shows afib with RVR  Assessment and Plan:  Persistent afib He is having refractory afib The patient has symptomatic, recurrent  atrial fibrillation. He is s/p ablation in 2019. Chads2vasc score is 6.  he is anticoagulated with xarelto . Therapeutic strategies for afib including medicine and repeat ablation were discussed in detail with the patient today. Risk, benefits, and alternatives to EP study and radiofrequency ablation for afib were also discussed in detail today. These risks include but are not limited to stroke, bleeding, vascular damage, tamponade, perforation, damage to the esophagus, lungs, and other structures, pulmonary vein stenosis, worsening renal function, and death. The patient understands these risk and wishes to proceed.  We will therefore proceed with catheter ablation at the next available time.  Carto, ICE, anesthesia are requested for the procedure.  Will also obtain cardiac CT prior to the procedure  to exclude LAA thrombus and further evaluate atrial anatomy.  Increase diltiazem to 240mg  daily today  2. HTN Stable No change required today  3. CAD s/p CABG Stable No change required today  4. OSA Uses CPAP  Thompson Grayer MD, [facility] 11/16/2020 2:07 PM

## 2020-11-16 NOTE — Patient Instructions (Addendum)
Medication Instructions:  Increase Diltiazem to 240 mg daily Your physician recommends that you continue on your current medications as directed. Please refer to the Current Medication list given to you today.  Labwork: CBC, BMP  Testing/Procedures: Your physician has requested that you have cardiac CT. Cardiac computed tomography (CT) is a painless test that uses an x-ray machine to take clear, detailed pictures of your heart. For further information please visit [employer]. Please follow instruction sheet as given.  Your physician has recommended that you have an ablation. Catheter ablation is a medical procedure used to treat some cardiac arrhythmias (irregular heartbeats). During catheter ablation, a long, thin, flexible tube is put into a blood vessel in your groin (upper thigh), or neck. This tube is called an ablation catheter. It is then guided to your heart through the blood vessel. Radio frequency waves destroy small areas of heart tissue where abnormal heartbeats may cause an arrhythmia to start. Please see the instruction sheet given to you today.  Any Other Special Instructions Will Be Listed Below (If Applicable).  If you need a refill on your cardiac medications before your next appointment, please call your pharmacy.   Cardiac Ablation Cardiac ablation is a procedure to destroy (ablate) some heart tissue that is sending bad signals. These bad signals causeproblems in heart rhythm. The heart has many areas that make these signals. If there are problems in these areas, they can make the heart beat in a way that is not normal.Destroying some tissues can help make the heart rhythm normal. Tell your doctor about: Any allergies you have. All medicines you are taking. These include vitamins, herbs, eye drops, creams, and over-the-counter medicines. Any problems you or family members have had with medicines that make you fall asleep (anesthetics). Any blood disorders you  have. Any surgeries you have had. Any medical conditions you have, such as kidney failure. Whether you are pregnant or may be pregnant. What are the risks? This is a safe procedure. But problems may occur, including: Infection. Bruising and bleeding. Bleeding into the chest. Stroke or blood clots. Damage to nearby areas of your body. Allergies to medicines or dyes. The need for a pacemaker if the normal system is damaged. Failure of the procedure to treat the problem. What happens before the procedure? Medicines Ask your doctor about: Changing or stopping your normal medicines. This is important. Taking aspirin and ibuprofen. Do not take these medicines unless your doctor tells you to take them. Taking other medicines, vitamins, herbs, and supplements. General instructions Follow instructions from your doctor about what you cannot eat or drink. Plan to have someone take you home from the hospital or clinic. If you will be going home right after the procedure, plan to have someone with you for 24 hours. Ask your doctor what steps will be taken to prevent infection. What happens during the procedure?  An IV tube will be put into one of your veins. You will be given a medicine to help you relax. The skin on your neck or groin will be numbed. A cut (incision) will be made in your neck or groin. A needle will be put through your cut and into a large vein. A tube (catheter) will be put into the needle. The tube will be moved to your heart. Dye may be put through the tube. This helps your doctor see your heart. Small devices (electrodes) on the tube will send out signals. A type of energy will be used to destroy some heart  tissue. The tube will be taken out. Pressure will be held on your cut. This helps stop bleeding. A bandage will be put over your cut. The exact procedure may vary among doctors and hospitals. What happens after the procedure? You will be watched until you leave the  hospital or clinic. This includes checking your heart rate, breathing rate, oxygen, and blood pressure. Your cut will be watched for bleeding. You will need to lie still for a few hours. Do not drive for 24 hours or as long as your doctor tells you. Summary Cardiac ablation is a procedure to destroy some heart tissue. This is done to treat heart rhythm problems. Tell your doctor about any medical conditions you may have. Tell him or her about all medicines you are taking to treat them. This is a safe procedure. But problems may occur. These include infection, bruising, bleeding, and damage to nearby areas of your body. Follow what your doctor tells you about food and drink. You may also be told to change or stop some of your medicines. After the procedure, do not drive for 24 hours or as long as your doctor tells you. This information is not intended to replace advice given to you by your health care provider. Make sure you discuss any questions you have with your healthcare provider. Document Revised: 04/21/2019 Document Reviewed: 04/21/2019 Elsevier Patient Education  2022 [employer].

## 2020-11-17 LAB — CBC WITH DIFFERENTIAL/PLATELET
Basophils Absolute: 0 10*3/uL (ref 0.0–0.2)
Basos: 1 %
EOS (ABSOLUTE): 0.2 10*3/uL (ref 0.0–0.4)
Eos: 3 %
Hematocrit: 38.4 % (ref 37.5–51.0)
Hemoglobin: 12.3 g/dL — ABNORMAL LOW (ref 13.0–17.7)
Immature Grans (Abs): 0 10*3/uL (ref 0.0–0.1)
Immature Granulocytes: 0 %
Lymphocytes Absolute: 1.4 10*3/uL (ref 0.7–3.1)
Lymphs: 19 %
MCH: 31.1 pg (ref 26.6–33.0)
MCHC: 32 g/dL (ref 31.5–35.7)
MCV: 97 fL (ref 79–97)
Monocytes Absolute: 0.7 10*3/uL (ref 0.1–0.9)
Monocytes: 9 %
Neutrophils Absolute: 5.2 10*3/uL (ref 1.4–7.0)
Neutrophils: 68 %
Platelets: 198 10*3/uL (ref 150–450)
RBC: 3.96 x10E6/uL — ABNORMAL LOW (ref 4.14–5.80)
RDW: 12.8 % (ref 11.6–15.4)
WBC: 7.6 10*3/uL (ref 3.4–10.8)

## 2020-11-17 LAB — BASIC METABOLIC PANEL
BUN/Creatinine Ratio: 18 (ref 10–24)
BUN: 22 mg/dL (ref 8–27)
CO2: 25 mmol/L (ref 20–29)
Calcium: 9.2 mg/dL (ref 8.6–10.2)
Chloride: 103 mmol/L (ref 96–106)
Creatinine, Ser: 1.25 mg/dL (ref 0.76–1.27)
Glucose: 92 mg/dL (ref 65–99)
Potassium: 4.6 mmol/L (ref 3.5–5.2)
Sodium: 146 mmol/L — ABNORMAL HIGH (ref 134–144)
eGFR: 59 mL/min/{1.73_m2} — ABNORMAL LOW (ref >59)

## 2020-11-23 ENCOUNTER — Telehealth (HOSPITAL_COMMUNITY): Payer: Self-pay | Admitting: Emergency Medicine

## 2020-11-23 NOTE — Telephone Encounter (Signed)
Reaching out to patient to offer assistance regarding upcoming cardiac imaging study; pt verbalizes understanding of appt date/time, parking situation and where to check in, pre-test NPO status and medications ordered, and verified current allergies; name and call back number provided for further questions should they arise Marchia Bond RN [facility] [phone] office [phone] cell  Pt to take daily meds except for lasix Clarise Cruz

## 2020-11-27 ENCOUNTER — Encounter (INDEPENDENT_AMBULATORY_CARE_PROVIDER_SITE_OTHER): Payer: Self-pay | Admitting: *Deleted

## 2020-11-27 ENCOUNTER — Other Ambulatory Visit: Payer: Self-pay

## 2020-11-27 ENCOUNTER — Ambulatory Visit (HOSPITAL_COMMUNITY)
Admission: RE | Admit: 2020-11-27 | Discharge: 2020-11-27 | Disposition: A | Discharge status: Home / Self Care | Payer: Medicare Other | Source: Ambulatory Visit | Attending: Internal Medicine | Admitting: Internal Medicine

## 2020-11-27 DIAGNOSIS — I4891 Unspecified atrial fibrillation: Secondary | ICD-10-CM | POA: Insufficient documentation

## 2020-11-27 MED ORDER — IOHEXOL 350 MG/ML SOLN
80.0000 mL | Freq: Once | INTRAVENOUS | Status: AC
  Administered 2020-11-27: 80 mL via INTRAVENOUS

## 2020-11-30 NOTE — Pre-Procedure Instructions (Signed)
Attempted to call patient regarding procedure instructions for Tuesday.  Left voice mail on the following items:  Arrival time 0530 Nothing to eat or drink after midnight No meds AM of procedure Responsible person to drive you home and stay with you for 24 hrs  Have you missed any doses of anti-coagulant Xarelto- don't miss any does except don't take on Tuesday morning the morning of your procedure.

## 2020-12-03 NOTE — Anesthesia Preprocedure Evaluation (Addendum)
Anesthesia Evaluation  Patient identified by MRN, date of birth, ID band Patient awake    Reviewed: Allergy & Precautions, NPO status , Patient's Chart, lab work & pertinent test results  History of Anesthesia Complications Negative for: history of anesthetic complications  Airway Mallampati: II  TM Distance: >3 FB Neck ROM: Full    Dental  (+) Teeth Intact   Pulmonary sleep apnea and Continuous Positive Airway Pressure Ventilation , former smoker,    Pulmonary exam normal        Cardiovascular hypertension, + CAD, + CABG (2015) and +CHF  + dysrhythmias Atrial Fibrillation  Rhythm:Irregular Rate:Normal     Neuro/Psych Carotid stenosis negative neurological ROS     GI/Hepatic Neg liver ROS, GERD  ,  Endo/Other  negative endocrine ROS  Renal/GU negative Renal ROS  negative genitourinary   Musculoskeletal  (+) Arthritis ,   Abdominal   Peds  Hematology negative hematology ROS (+)   Anesthesia Other Findings  Echo 12/19/19: EF 50-55%, low normal LV function, no regional wall motion abnormalities, g2dd, normal RV function, 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), mod LAD, mod RAD, no significant valve disease  Reproductive/Obstetrics                            Anesthesia Physical Anesthesia Plan  ASA: 3  Anesthesia Plan: General   Post-op Pain Management:    Induction: Intravenous  PONV Risk Score and Plan: 2 and Ondansetron, Dexamethasone, Treatment may vary due to age or medical condition and Midazolam  Airway Management Planned: Oral ETT  Additional Equipment: None  Intra-op Plan:   Post-operative Plan: Extubation in OR  Informed Consent: I have reviewed the patients History and Physical, chart, labs and discussed the procedure including the risks, benefits and alternatives for the proposed anesthesia with the patient or authorized representative who has indicated his/her understanding and  acceptance.     Dental advisory given  Plan Discussed with:   Anesthesia Plan Comments:        Anesthesia Quick Evaluation

## 2020-12-04 ENCOUNTER — Ambulatory Visit (HOSPITAL_COMMUNITY)
Admission: RE | Admit: 2020-12-04 | Discharge: 2020-12-04 | Disposition: A | Discharge status: Home / Self Care | Payer: Medicare Other | Attending: Internal Medicine | Admitting: Internal Medicine

## 2020-12-04 ENCOUNTER — Encounter (HOSPITAL_COMMUNITY)
Admission: RE | Disposition: A | Discharge status: Home / Self Care | Payer: Self-pay | Source: Home / Self Care | Attending: Internal Medicine

## 2020-12-04 ENCOUNTER — Other Ambulatory Visit: Payer: Self-pay

## 2020-12-04 ENCOUNTER — Ambulatory Visit (HOSPITAL_COMMUNITY): Payer: Medicare Other | Admitting: Anesthesiology

## 2020-12-04 ENCOUNTER — Encounter (HOSPITAL_COMMUNITY): Payer: Self-pay | Admitting: Internal Medicine

## 2020-12-04 DIAGNOSIS — Z951 Presence of aortocoronary bypass graft: Secondary | ICD-10-CM | POA: Insufficient documentation

## 2020-12-04 DIAGNOSIS — I10 Essential (primary) hypertension: Secondary | ICD-10-CM | POA: Insufficient documentation

## 2020-12-04 DIAGNOSIS — I2510 Atherosclerotic heart disease of native coronary artery without angina pectoris: Secondary | ICD-10-CM | POA: Insufficient documentation

## 2020-12-04 DIAGNOSIS — I4819 Other persistent atrial fibrillation: Secondary | ICD-10-CM | POA: Insufficient documentation

## 2020-12-04 DIAGNOSIS — I480 Paroxysmal atrial fibrillation: Secondary | ICD-10-CM | POA: Diagnosis not present

## 2020-12-04 DIAGNOSIS — G4733 Obstructive sleep apnea (adult) (pediatric): Secondary | ICD-10-CM | POA: Insufficient documentation

## 2020-12-04 DIAGNOSIS — Z7901 Long term (current) use of anticoagulants: Secondary | ICD-10-CM | POA: Insufficient documentation

## 2020-12-04 DIAGNOSIS — M48061 Spinal stenosis, lumbar region without neurogenic claudication: Secondary | ICD-10-CM | POA: Diagnosis not present

## 2020-12-04 DIAGNOSIS — E785 Hyperlipidemia, unspecified: Secondary | ICD-10-CM | POA: Diagnosis not present

## 2020-12-04 DIAGNOSIS — Z79899 Other long term (current) drug therapy: Secondary | ICD-10-CM | POA: Insufficient documentation

## 2020-12-04 HISTORY — PX: ATRIAL FIBRILLATION ABLATION: EP1191

## 2020-12-04 LAB — POCT ACTIVATED CLOTTING TIME: Activated Clotting Time: 341 seconds

## 2020-12-04 SURGERY — ATRIAL FIBRILLATION ABLATION
Anesthesia: General

## 2020-12-04 MED ORDER — SODIUM CHLORIDE 0.9 % IV SOLN: INTRAVENOUS | Status: DC

## 2020-12-04 MED ORDER — HEPARIN (PORCINE) IN NACL 1000-0.9 UT/500ML-% IV SOLN
INTRAVENOUS | Status: DC | PRN
  Administered 2020-12-04: 500 mL

## 2020-12-04 MED ORDER — PROPOFOL 10 MG/ML IV BOLUS
INTRAVENOUS | Status: DC | PRN
  Administered 2020-12-04: 150 mg via INTRAVENOUS

## 2020-12-04 MED ORDER — HEPARIN (PORCINE) IN NACL 1000-0.9 UT/500ML-% IV SOLN
INTRAVENOUS | Status: AC
  Filled 2020-12-04: qty 500

## 2020-12-04 MED ORDER — HEPARIN SODIUM (PORCINE) 1000 UNIT/ML IJ SOLN
INTRAMUSCULAR | Status: AC
  Filled 2020-12-04: qty 1

## 2020-12-04 MED ORDER — SODIUM CHLORIDE 0.9% FLUSH: 3.0000 mL | Freq: Two times a day (BID) | INTRAVENOUS | Status: DC

## 2020-12-04 MED ORDER — PHENYLEPHRINE HCL-NACL 10-0.9 MG/250ML-% IV SOLN
INTRAVENOUS | Status: DC | PRN
  Administered 2020-12-04: 15 ug/min via INTRAVENOUS

## 2020-12-04 MED ORDER — LACTATED RINGERS IV SOLN: INTRAVENOUS | Status: DC | PRN

## 2020-12-04 MED ORDER — HEPARIN SODIUM (PORCINE) 1000 UNIT/ML IJ SOLN
INTRAMUSCULAR | Status: DC | PRN
  Administered 2020-12-04: 1000 [IU] via INTRAVENOUS
  Administered 2020-12-04: 15000 [IU] via INTRAVENOUS

## 2020-12-04 MED ORDER — HEPARIN SODIUM (PORCINE) 1000 UNIT/ML IJ SOLN
INTRAMUSCULAR | Status: DC | PRN
  Administered 2020-12-04: 1000 [IU] via INTRAVENOUS

## 2020-12-04 MED ORDER — PROTAMINE SULFATE 10 MG/ML IV SOLN
INTRAVENOUS | Status: DC | PRN
  Administered 2020-12-04 (×4): 10 mg via INTRAVENOUS

## 2020-12-04 MED ORDER — ONDANSETRON HCL 4 MG/2ML IJ SOLN
INTRAMUSCULAR | Status: DC | PRN
  Administered 2020-12-04: 4 mg via INTRAVENOUS

## 2020-12-04 MED ORDER — SUGAMMADEX SODIUM 200 MG/2ML IV SOLN
INTRAVENOUS | Status: DC | PRN
  Administered 2020-12-04: 200 mg via INTRAVENOUS

## 2020-12-04 MED ORDER — ROCURONIUM BROMIDE 10 MG/ML (PF) SYRINGE
PREFILLED_SYRINGE | INTRAVENOUS | Status: DC | PRN
  Administered 2020-12-04: 70 mg via INTRAVENOUS
  Administered 2020-12-04: 30 mg via INTRAVENOUS

## 2020-12-04 MED ORDER — LIDOCAINE 2% (20 MG/ML) 5 ML SYRINGE
INTRAMUSCULAR | Status: DC | PRN
  Administered 2020-12-04: 100 mg via INTRAVENOUS

## 2020-12-04 SURGICAL SUPPLY — 20 items
BLANKET WARM UNDERBOD FULL ACC (MISCELLANEOUS) ×3 IMPLANT
CATH MAPPNG PENTARAY F 2-6-2MM (CATHETERS) ×1 IMPLANT
CATH SMTCH THERMOCOOL SF DF (CATHETERS) ×3 IMPLANT
CATH SOUNDSTAR ECO 8FR (CATHETERS) ×3 IMPLANT
CATH WEBSTER BI DIR CS D-F CRV (CATHETERS) ×3 IMPLANT
CLOSURE PERCLOSE PROSTYLE (VASCULAR PRODUCTS) ×15 IMPLANT
COVER SWIFTLINK CONNECTOR (BAG) ×3 IMPLANT
MAT PREVALON FULL STRYKER (MISCELLANEOUS) ×3 IMPLANT
NEEDLE BAYLIS TRANSSEPTAL 71CM (NEEDLE) ×3 IMPLANT
PACK EP LATEX FREE (CUSTOM PROCEDURE TRAY) ×3
PACK EP LF (CUSTOM PROCEDURE TRAY) ×1 IMPLANT
PAD PRO RADIOLUCENT 2001M-C (PAD) ×3 IMPLANT
PATCH CARTO3 (PAD) ×3 IMPLANT
PENTARAY F 2-6-2MM (CATHETERS) ×3
SHEATH BAYLIS SUREFLEX  M 8.5 (SHEATH) ×3
SHEATH BAYLIS SUREFLEX M 8.5 (SHEATH) ×1 IMPLANT
SHEATH PINNACLE 7F 10CM (SHEATH) ×6 IMPLANT
SHEATH PINNACLE 8F 10CM (SHEATH) ×3 IMPLANT
SHEATH PINNACLE 9F 10CM (SHEATH) ×3 IMPLANT
TUBING SMART ABLATE COOLFLOW (TUBING) ×3 IMPLANT

## 2020-12-04 NOTE — Discharge Instructions (Addendum)
Post procedure care instructions No driving for 4 days. No lifting over 5 lbs for 1 week. No vigorous or sexual activity for 1 week. You may return to work/your usual activities on 12/12/20. Keep procedure site clean & dry. If you notice increased pain, swelling, bleeding or pus, call/return!  You may shower after 24 hours, but no soaking in baths/hot tubs/pools for 1 week.     You have an appointment set up with the [facility].  Multiple studies have shown that being followed by a dedicated atrial fibrillation clinic in addition to the standard care you receive from your other physicians improves health. We believe that enrollment in the atrial fibrillation clinic will allow [place] to better care for you.   The phone number to the [facility] is [phone]. The clinic is staffed Monday through Friday from 8:30am to 5pm.  Parking Directions: The clinic is located in the Heart and Vascular Building connected to [facility]. 1)From [address] turn on to [address] and go to the 3rd entrance  (Heart and Vascular entrance) on the right. 2)Look to the right for Heart &Vascular Parking Garage. 3)A code for the entrance is required, for August is 5544.   4)Take the elevators to the 1st floor. Registration is in the room with the glass walls at the end of the hallway.  If you have any trouble parking or locating the clinic, please don't hesitate to call [phone].   Cardiac Ablation, Care After  This sheet gives you information about how to care for yourself after your procedure. Your health care provider may also give you more specific instructions. If you have problems or questions, contact your health care provider. What can I expect after the procedure? After the procedure, it is common to have: Bruising around your puncture site. Tenderness around your puncture site. Skipped heartbeats. Tiredness (fatigue).  Follow these instructions at  home: Puncture site care  Follow instructions from your health care provider about how to take care of your puncture site. Make sure you: If a square bandage is present, this may be removed in 24 hours.  Check your puncture site every day for signs of infection. Check for: Redness, swelling, or pain. Fluid or blood. If your puncture site starts to bleed, lie down on your back, apply firm pressure to the area, and contact your health care provider. Warmth. Pus or a bad smell. Driving Do not drive for at least 4 days after your procedure or however long your health care provider recommends. (Do not resume driving if you have previously been instructed not to drive for other health reasons.) Do not drive or use heavy machinery while taking prescription pain medicine. Activity Avoid activities that take a lot of effort for at least 7 days after your procedure. Do not lift anything that is heavier than 5 lb (4.5 kg) for one week.  No sexual activity for 1 week.  Return to your normal activities as told by your health care provider. Ask your health care provider what activities are safe for you. General instructions Take over-the-counter and prescription medicines only as told by your health care provider. Do not use any products that contain nicotine or tobacco, such as cigarettes and e-cigarettes. If you need help quitting, ask your health care provider. You may shower after 24 hours, but Do not take baths, swim, or use a hot tub for 1 week.  Do not drink alcohol for 24 hours after your procedure. Keep all  follow-up visits as told by your health care provider. This is important. Contact a health care provider if: You have redness, mild swelling, or pain around your puncture site. You have fluid or blood coming from your puncture site that stops after applying firm pressure to the area. Your puncture site feels warm to the touch. You have pus or a bad smell coming from your puncture site. You  have a fever. You have chest pain or discomfort that spreads to your neck, jaw, or arm. You are sweating a lot. You feel nauseous. You have a fast or irregular heartbeat. You have shortness of breath. You are dizzy or light-headed and feel the need to lie down. You have pain or numbness in the arm or leg closest to your puncture site. Get help right away if: Your puncture site suddenly swells. Your puncture site is bleeding and the bleeding does not stop after applying firm pressure to the area. These symptoms may represent a serious problem that is an emergency. Do not wait to see if the symptoms will go away. Get medical help right away. Call your local emergency services (911 in the [place] Do not drive yourself to the hospital. Summary After the procedure, it is normal to have bruising and tenderness at the puncture site in your groin, neck, or forearm. Check your puncture site every day for signs of infection. Get help right away if your puncture site is bleeding and the bleeding does not stop after applying firm pressure to the area. This is a medical emergency. This information is not intended to replace advice given to you by your health care provider. Make sure you discuss any questions you have with your health care provider.

## 2020-12-04 NOTE — Anesthesia Procedure Notes (Signed)
Procedure Name: Intubation Date/Time: 12/04/2020 7:43 AM Performed by: Georgia Duff, CRNA Pre-anesthesia Checklist: Patient identified, Emergency Drugs available, Suction available and Patient being monitored Patient Re-evaluated:Patient Re-evaluated prior to induction Oxygen Delivery Method: Circle System Utilized Preoxygenation: Pre-oxygenation with 100% oxygen Induction Type: IV induction Ventilation: Mask ventilation without difficulty Laryngoscope Size: Miller and 2 Grade View: Grade I Tube type: Oral Tube size: 7.5 mm Number of attempts: 1 Airway Equipment and Method: Stylet and Oral airway Placement Confirmation: ETT inserted through vocal cords under direct vision, positive ETCO2 and breath sounds checked- equal and bilateral Secured at: 23 cm Tube secured with: Tape Dental Injury: Teeth and Oropharynx as per pre-operative assessment

## 2020-12-04 NOTE — Transfer of Care (Signed)
Immediate Anesthesia Transfer of Care Note  Patient: Caleb Wolf  Procedure(s) Performed: ATRIAL FIBRILLATION ABLATION  Patient Location: PACU  Anesthesia Type:General  Level of Consciousness: drowsy and patient cooperative  Airway & Oxygen Therapy: Patient Spontanous Breathing  Post-op Assessment: Report given to RN and Post -op Vital signs reviewed and stable  Post vital signs: Reviewed and stable  Last Vitals:  Vitals Value Taken Time  BP    Temp    Pulse 63 12/04/20 0949  Resp 22 12/04/20 0949  SpO2 95 % 12/04/20 0949  Vitals shown include unvalidated device data.  Last Pain:  Vitals:   12/04/20 0548  TempSrc:   PainSc: 4       Patients Stated Pain Goal: 4 (14/44/58 4835)  Complications: No notable events documented.

## 2020-12-04 NOTE — Anesthesia Postprocedure Evaluation (Addendum)
Anesthesia Post Note  Patient: Caleb Wolf  Procedure(s) Performed: ATRIAL FIBRILLATION ABLATION     Patient location during evaluation: [facility] Recovery Anesthesia Type: General Level of consciousness: awake and alert Pain management: pain level controlled Vital Signs Assessment: post-procedure vital signs reviewed and stable Respiratory status: spontaneous breathing, nonlabored ventilation and respiratory function stable Cardiovascular status: blood pressure returned to baseline and stable Postop Assessment: no apparent nausea or vomiting Anesthetic complications: no   No notable events documented.  Last Vitals:  Vitals:   12/04/20 1130 12/04/20 1145  BP: (!) 133/52 (!) 126/54  Pulse: 69 68  Resp: (!) 22 16  Temp:    SpO2: 99% 100%    Last Pain:  Vitals:   12/04/20 1027  TempSrc:   PainSc: 0-No pain                 Lidia Collum

## 2020-12-04 NOTE — Interval H&P Note (Signed)
History and Physical Interval Note:  12/04/2020 7:22 AM  Caleb Wolf  has presented today for surgery, with the diagnosis of afib.  The various methods of treatment have been discussed with the patient and family. After consideration of risks, benefits and other options for treatment, the patient has consented to  Procedure(s): ATRIAL FIBRILLATION ABLATION (N/A) as a surgical intervention.  The patient's history has been reviewed, patient examined, no change in status, stable for surgery.  I have reviewed the patient's chart and labs.  Questions were answered to the patient's satisfaction.    Risk, benefits, and alternatives to EP study and radiofrequency ablation for afib were again discussed in detail today. These risks include but are not limited to stroke, bleeding, vascular damage, tamponade, perforation, damage to the esophagus, lungs, and other structures, pulmonary vein stenosis, worsening renal function, and death. The patient understands these risk and wishes to proceed.    Cardiac CT reviewed at length with the patient today.  he reports compliance with [name] without interruption.  Thompson Grayer MD, [place] 12/04/2020 7:22 AM     Thompson Grayer

## 2020-12-07 DIAGNOSIS — I10 Essential (primary) hypertension: Secondary | ICD-10-CM | POA: Diagnosis not present

## 2020-12-07 DIAGNOSIS — Z79899 Other long term (current) drug therapy: Secondary | ICD-10-CM | POA: Diagnosis not present

## 2020-12-07 DIAGNOSIS — N1831 Chronic kidney disease, stage 3a: Secondary | ICD-10-CM | POA: Diagnosis not present

## 2020-12-07 DIAGNOSIS — E785 Hyperlipidemia, unspecified: Secondary | ICD-10-CM | POA: Diagnosis not present

## 2020-12-07 DIAGNOSIS — I5022 Chronic systolic (congestive) heart failure: Secondary | ICD-10-CM | POA: Diagnosis not present

## 2020-12-08 NOTE — Assessment & Plan Note (Signed)
EXAM c/w RSR today. Plan- follow with cardiology as planned

## 2020-12-08 NOTE — Assessment & Plan Note (Signed)
Benefits from CPAP with good compliance and control Plan- continue auto 5-15 

## 2020-12-13 DIAGNOSIS — I480 Paroxysmal atrial fibrillation: Secondary | ICD-10-CM | POA: Diagnosis not present

## 2020-12-13 DIAGNOSIS — I5022 Chronic systolic (congestive) heart failure: Secondary | ICD-10-CM | POA: Diagnosis not present

## 2021-01-10 ENCOUNTER — Other Ambulatory Visit: Payer: Self-pay

## 2021-01-10 ENCOUNTER — Ambulatory Visit (HOSPITAL_COMMUNITY)
Admission: RE | Admit: 2021-01-10 | Discharge: 2021-01-10 | Disposition: A | Discharge status: Home / Self Care | Payer: Medicare Other | Source: Ambulatory Visit | Attending: Nurse Practitioner | Admitting: Nurse Practitioner

## 2021-01-10 VITALS — BP 126/56 | HR 107 | Ht 70.5 in | Wt 231.6 lb

## 2021-01-10 DIAGNOSIS — Z87891 Personal history of nicotine dependence: Secondary | ICD-10-CM | POA: Insufficient documentation

## 2021-01-10 DIAGNOSIS — Z7901 Long term (current) use of anticoagulants: Secondary | ICD-10-CM | POA: Insufficient documentation

## 2021-01-10 DIAGNOSIS — D6869 Other thrombophilia: Secondary | ICD-10-CM | POA: Diagnosis not present

## 2021-01-10 DIAGNOSIS — Z79899 Other long term (current) drug therapy: Secondary | ICD-10-CM | POA: Insufficient documentation

## 2021-01-10 DIAGNOSIS — I4819 Other persistent atrial fibrillation: Secondary | ICD-10-CM | POA: Diagnosis not present

## 2021-01-10 MED ORDER — FUROSEMIDE 40 MG PO TABS: 40.0000 mg | ORAL_TABLET | Freq: Every day | ORAL | Status: DC

## 2021-01-10 MED ORDER — DILTIAZEM HCL 30 MG PO TABS: ORAL_TABLET | ORAL | 1 refills | Status: DC

## 2021-01-10 NOTE — Progress Notes (Signed)
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 male with a h/o afib, s/p ablation 2019. He had a cardioversion in October  and recently on f/u with [name] 06/04/20 he was in [place].  He had a back injection the next day and restarted blood thinner 1/4. He started back in afib on 1/6. He is taking cardizem 30 mg every 4 hours with reasonable rate control.   F/u, 07/13/20,  successful cardioversion 2/4. He did have frequent PAC's after the procedure but is in SR today. He continues the 30 mg Cardizem q 6 hours. I will change to 120 mg daily.   F/u in afib clinic, 10/17/20, as he went into afib earlier in the week. Only trigger may have been cellulitis of left leg but he is at the end of antibiotics and is much improved. He is rate controlled for the most part and is symptomatic with fatigue in afib. On last visit, we discussed if increase in afib burden he may need repeat ablation and he stated that [name] has also mentioned this as well. He would like to go back to him to discuss. He has been treated with amiodarone in the past but would  prefer to not  restart as for the S.E. profile associated with drug.   F/u in afib clinic, 01/10/21. He had an ablation one month ago. Had a few short episodes of afib but  went into persistent  afib last Sunday. He feels fatigued and winded with activity when in afib. No missed xarelto with CHA2DS2VASc  score of at least 5.   Today, he denies symptoms of palpitations, chest pain, shortness of breath, orthopnea, PND, lower extremity edema, dizziness, presyncope, syncope, or neurologic sequela. The patient is tolerating medications without difficulties and is otherwise without complaint today.   Past Medical History:  Diagnosis Date   Arthritis    Basal cell carcinoma    Carotid stenosis    Coronary artery disease    Multvessel s/p CABG 2015   Enlarged prostate    Essential hypertension    GERD  (gastroesophageal reflux disease)    Gout    History of colon polyps    History of kidney stones    Hyperlipidemia    Lumbar disc disease    Persistent atrial fibrillation (HCC)    Sleep apnea    CPAP   Past Surgical History:  Procedure Laterality Date   ABLATION OF DYSRHYTHMIC FOCUS  07/28/2017   ATRIAL FIBRILLATION ABLATION N/A 07/28/2017   Procedure: ATRIAL FIBRILLATION ABLATION;  Surgeon: Thompson Grayer, MD;  Location: [place] [facility];  Service: Cardiovascular;  Laterality: N/A;   ATRIAL FIBRILLATION ABLATION N/A 12/04/2020   Procedure: ATRIAL FIBRILLATION ABLATION;  Surgeon: Thompson Grayer, MD;  Location: [place] [facility];  Service: Cardiovascular;  Laterality: N/A;   BACK SURGERY     CARDIAC CATHETERIZATION  05/22/2014   Procedure: IABP INSERTION;  Surgeon: Leonie Man, MD;  Location: [facility] CATH LAB;  Service: Cardiovascular;;   CARDIOVERSION N/A 04/29/2017   Procedure: CARDIOVERSION;  Surgeon: Satira Sark, MD;  [facility];  Service: Cardiovascular;  Laterality: N/A;   CARDIOVERSION N/A 08/13/2017   Procedure: CARDIOVERSION;  Surgeon: Pixie Casino, MD;  Location: [facility] ENDOSCOPY;  Service: Cardiovascular;  Laterality: N/A;   CARDIOVERSION N/A 08/15/2019   Procedure: CARDIOVERSION;  Surgeon: Dorothy Spark, MD;  Location: [place];  Service: Cardiovascular;  Laterality:  N/A;   CARDIOVERSION N/A 03/09/2020   Procedure: CARDIOVERSION;  Surgeon: Buford Dresser, MD;  Location: [place];  Service: Cardiovascular;  Laterality: N/A;   CARDIOVERSION N/A 07/06/2020   Procedure: CARDIOVERSION;  Surgeon: Sanda Klein, MD;  Location: [place];  Service: Cardiovascular;  Laterality: N/A;   Cataract surgery Right    COLONOSCOPY     COLONOSCOPY N/A 12/30/2017   Procedure: COLONOSCOPY;  Surgeon: Rogene Houston, MD;  [facility];  Service: Endoscopy;  Laterality: N/A;   CORONARY ARTERY BYPASS GRAFT N/A 05/22/2014   Procedure: CORONARY  ARTERY BYPASS GRAFTING (CABG) times three using left internal mammary and right saphenous vein.;  Surgeon: Melrose Nakayama, MD;  Location: [place];  Service: Open Heart Surgery;  Laterality: N/A;   ENDARTERECTOMY Left 02/17/2014   Procedure: ENDARTERECTOMY CAROTID WITH PATCH ANGIOPLASTY;  Surgeon: Mal Misty, MD;  Location: [place];  Service: Vascular;  Laterality: Left;   ESOPHAGEAL DILATION N/A 08/22/2015   Procedure: ESOPHAGEAL DILATION;  Surgeon: Rogene Houston, MD;  [facility];  Service: Endoscopy;  Laterality: N/A;   ESOPHAGOGASTRODUODENOSCOPY N/A 08/22/2015   Procedure: ESOPHAGOGASTRODUODENOSCOPY (EGD);  Surgeon: Rogene Houston, MD;  [facility];  Service: Endoscopy;  Laterality: N/A;  12:45 - moved to 1:55 - Ann notified pt   ESOPHAGOGASTRODUODENOSCOPY N/A 12/30/2017   Procedure: ESOPHAGOGASTRODUODENOSCOPY (EGD);  Surgeon: Rogene Houston, MD;  [facility];  Service: Endoscopy;  Laterality: N/A;  200   EYE SURGERY     cataract extraction (right) with repair macular tear , with IOL     right   FRACTURE SURGERY     bilateral wrist fractures- one ORIF   JOINT REPLACEMENT  2011   left knee   LEFT HEART CATHETERIZATION WITH CORONARY ANGIOGRAM N/A 05/22/2014   Procedure: LEFT HEART CATHETERIZATION WITH CORONARY ANGIOGRAM;  Surgeon: Leonie Man, MD;  Location: [facility] CATH LAB;  Service: Cardiovascular;  Laterality: N/A;   LITHOTRIPSY     PARS PLANA VITRECTOMY W/ REPAIR OF MACULAR HOLE     RHINOPLASTY     TEE WITHOUT CARDIOVERSION N/A 04/29/2017   Procedure: TRANSESOPHAGEAL ECHOCARDIOGRAM (TEE) WITH PROPOFOL;  Surgeon: Satira Sark, MD;  [facility];  Service: Cardiovascular;  Laterality: N/A;   TONSILLECTOMY     TOTAL HIP ARTHROPLASTY  12/08/2011   Procedure: TOTAL HIP ARTHROPLASTY;  Surgeon: Gearlean Alf, MD;  Location: [facility];  Service: Orthopedics;  Laterality: Right;   UPPER GI ENDOSCOPY  12/18/2015   Procedure: UPPER GI  ENDOSCOPY;  Surgeon: Ralene Ok, MD;  Location: [facility]S;  Service: General;;   WRIST SURGERY Right 68yr ago   WRIST SURGERY Left     Current Outpatient Medications  Medication Sig Dispense Refill   acetaminophen (TYLENOL) 500 MG tablet Take 1,000 mg by mouth every 6 (six) hours as needed for moderate pain.     amoxicillin (AMOXIL) 500 MG capsule Take 2,000 mg by mouth See admin instructions. Before dental procedures     atorvastatin (LIPITOR) 80 MG tablet Take 1 tablet (80 mg total) by mouth every evening. (Patient taking differently: Take 80 mg by mouth every morning.) 90 tablet 3   colchicine 0.6 MG tablet Take 0.6 mg by mouth daily as needed (for gout flare up).     Cyanocobalamin (B-12) 1000 MCG SUBL Place 1,000 mcg under the tongue daily.      cyclobenzaprine (FLEXERIL) 10 MG tablet Take 10 mg by mouth 2 (two) times daily  as needed for muscle spasms.     diltiazem (CARDIZEM CD) 240 MG 24 hr capsule Take 1 capsule (240 mg total) by mouth daily. 90 capsule 3   diltiazem (CARDIZEM) 30 MG tablet Take 1 tablet every 4 hours AS NEEDED for heart rate >100 30 tablet 1   doxazosin (CARDURA) 4 MG tablet Take 4 mg by mouth daily.  4   finasteride (PROSCAR) 5 MG tablet Take 5 mg by mouth every evening.      fluticasone (FLONASE) 50 MCG/ACT nasal spray Place 1 spray into both nostrils daily as needed for allergies or rhinitis.     lisinopril (PRINIVIL,ZESTRIL) 40 MG tablet Take 40 mg by mouth daily.     nitroGLYCERIN (NITROSTAT) 0.4 MG SL tablet Place 1 tablet (0.4 mg total) under the tongue every 5 (five) minutes x 3 doses as needed for chest pain. 75 tablet 3   pantoprazole (PROTONIX) 40 MG tablet Take 1 tablet (40 mg total) by mouth daily before breakfast. 90 tablet 3   rivaroxaban (XARELTO) 20 MG TABS tablet Take 1 tablet (20 mg total) by mouth daily with supper. 90 tablet 3   furosemide (LASIX) 40 MG tablet Take 1 tablet (40 mg total) by mouth daily.     No current facility-administered  medications for this encounter.    Allergies  Allergen Reactions   Codeine Sulfate Nausea Only    Social History   Socioeconomic History   Marital status: Married    Spouse name: Not on file   Number of children: Not on file   Years of education: Not on file   Highest education level: Not on file  Occupational History   Occupation: retired    Comment: [employer]  Tobacco Use   Smoking status: Former    Packs/day: 1.50    Years: 25.00    Pack years: 37.50    Types: Cigarettes    Start date: 08/08/1960    Quit date: 06/02/1982    Years since quitting: 38.6   Smokeless tobacco: Never   Tobacco comments:    quit smoking 30+yrs ago  Vaping Use   Vaping Use: Never used  Substance and Sexual Activity   Alcohol use: No    Alcohol/week: 0.0 standard drinks    Comment: occasionally    Drug use: No   Sexual activity: Yes  Other Topics Concern   Not on file  Social History Narrative   Not on file   Social Determinants of Health   Financial Resource Strain: Not on file  Food Insecurity: Not on file  Transportation Needs: Not on file  Physical Activity: Not on file  Stress: Not on file  Social Connections: Not on file  Intimate Partner Violence: Not on file    Family History  Problem Relation Age of Onset   Allergies Mother    Heart disease Mother    Hypertension Mother    Heart disease Father        MI    ROS- All systems are reviewed and negative except as per the HPI above  Physical Exam: Vitals:   01/10/21 1311  BP: (!) 126/56  Pulse: (!) 107  Weight: 105.1 kg  Height: 5' 10.5" (1.791 m)   Wt Readings from Last 3 Encounters:  01/10/21 105.1 kg  12/04/20 103.4 kg  11/16/20 104.3 kg    Labs: Lab Results  Component Value Date   NA 146 (H) 11/16/2020   K 4.6 11/16/2020   CL 103 11/16/2020  CO2 25 11/16/2020   GLUCOSE 92 11/16/2020   BUN 22 11/16/2020   CREATININE 1.25 11/16/2020   CALCIUM 9.2 11/16/2020   PHOS 3.3 04/29/2017    MG 1.9 04/30/2017   Lab Results  Component Value Date   INR 2.2 (H) 08/15/2019   Lab Results  Component Value Date   CHOL 134 03/22/2015   HDL 41 03/22/2015   LDLCALC 75 03/22/2015   TRIG 92 03/22/2015     GEN- The patient is well appearing, alert and oriented x 3 today.   Head- normocephalic, atraumatic Eyes-  Sclera clear, conjunctiva pink Ears- hearing intact Oropharynx- clear Neck- supple, no JVP Lymph- no cervical lymphadenopathy Lungs- Clear to ausculation bilaterally, normal work of breathing Heart- irregular rate and rhythm, no murmurs, rubs or gallops, PMI not laterally displaced GI- soft, NT, ND, + BS Extremities- no clubbing, cyanosis, or edema MS- no significant deformity or atrophy Skin- no rash or lesion Psych- euthymic mood, full affect Neuro- strength and sensation are intact  EKG-   afib at 107 bpm, qrs int 90 ms, qtc 445 ms     Assessment and Plan: 1. Afib  Successful  cardioversion in  March 2021 and again 03/09/20 Recent  steroid back injection may have triggered afib in January Successful cardioversion 06/19/2020 S/p ablation one month ago and now back in afib since last Sunday  Continue 240 mg cardizem daily, and 30 mg cardizem every 4 hours if HR over 100 and BP is stable with sys BP over 100   2. CHA2DS2VASc score of 6 Continue  xarelto 20 mg daily  States no missed anticoagulation for at least 3 weeks   Reminded  not to miss any doses for possible cardioversion  I will see back on Monday for EKG and is still out of rhythm will plan on cardioversion     Butch Penny C. Alain Deschene, [facility] [address] [place], [place] [address] [phone]

## 2021-01-10 NOTE — Patient Instructions (Signed)
Cardizem 30mg -- take 1 tablet every 4 hours AS NEEDED for heart rate >100 as long as top number of blood pressure >100.  

## 2021-01-14 ENCOUNTER — Ambulatory Visit (HOSPITAL_COMMUNITY)
Admission: RE | Admit: 2021-01-14 | Discharge: 2021-01-14 | Disposition: A | Discharge status: Home / Self Care | Payer: Medicare Other | Source: Ambulatory Visit | Attending: Nurse Practitioner | Admitting: Nurse Practitioner

## 2021-01-14 ENCOUNTER — Other Ambulatory Visit: Payer: Self-pay

## 2021-01-14 VITALS — BP 128/70 | HR 113

## 2021-01-14 DIAGNOSIS — I4819 Other persistent atrial fibrillation: Secondary | ICD-10-CM | POA: Diagnosis not present

## 2021-01-14 DIAGNOSIS — I4891 Unspecified atrial fibrillation: Secondary | ICD-10-CM

## 2021-01-14 LAB — CBC
HCT: 38.3 % — ABNORMAL LOW (ref 39.0–52.0)
Hemoglobin: 12.3 g/dL — ABNORMAL LOW (ref 13.0–17.0)
MCH: 31.9 pg (ref 26.0–34.0)
MCHC: 32.1 g/dL (ref 30.0–36.0)
MCV: 99.5 fL (ref 80.0–100.0)
Platelets: 157 10*3/uL (ref 150–400)
RBC: 3.85 MIL/uL — ABNORMAL LOW (ref 4.22–5.81)
RDW: 13.4 % (ref 11.5–15.5)
WBC: 6.4 10*3/uL (ref 4.0–10.5)
nRBC: 0 % (ref 0.0–0.2)

## 2021-01-14 LAB — BASIC METABOLIC PANEL
Anion gap: 8 (ref 5–15)
BUN: 23 mg/dL (ref 8–23)
CO2: 25 mmol/L (ref 22–32)
Calcium: 8.9 mg/dL (ref 8.9–10.3)
Chloride: 105 mmol/L (ref 98–111)
Creatinine, Ser: 1.45 mg/dL — ABNORMAL HIGH (ref 0.61–1.24)
GFR, Estimated: 49 mL/min — ABNORMAL LOW (ref >60)
Glucose, Bld: 109 mg/dL — ABNORMAL HIGH (ref 70–99)
Potassium: 4.2 mmol/L (ref 3.5–5.1)
Sodium: 138 mmol/L (ref 135–145)

## 2021-01-14 NOTE — Patient Instructions (Signed)
Cardioversion scheduled for August 19th 2022  - Arrive at the [facility] and go to admitting at 9:30am  - Do not eat or drink anything after midnight the night prior to your procedure.  - Take all your morning medication (except diabetic medications) with a sip of water prior to arrival.  - You will not be able to drive home after your procedure.  - Do NOT miss any doses of your blood thinner - if you should miss a dose please notify our office immediately.  - If you feel as if you go back into normal rhythm prior to scheduled cardioversion, please notify our office immediately. If your procedure is canceled in the cardioversion suite you will be charged a cancellation fee.  Patients will be asked to: to mask in public and hand hygiene (no longer quarantine) in the 3 days prior to surgery, to report if any COVID-19-like illness or household contacts to COVID-19 to determine need for testing

## 2021-01-14 NOTE — Progress Notes (Addendum)
Pt is in for EKG with return of afib with RVR,  s/p recent ablation. He continues in afib  with RVR at 113 bpm. He has been taking cardizem 30 mg  for HR's over 100 at home over the weekend.  He has not not missed any anticoagulation. Will plan on cardioversion. Please see my office noted form 01/10/21. Bmet/cbc today.

## 2021-01-14 NOTE — H&P (View-Only) (Signed)
Pt is in for EKG with return of afib with RVR,  s/p recent ablation. He continues in afib  with RVR at 113 bpm. He has been taking cardizem 30 mg  for HR's over 100 at home over the weekend.  He has not not missed any anticoagulation. Will plan on cardioversion. Please see my office noted form 01/10/21. Bmet/cbc today.

## 2021-01-15 ENCOUNTER — Other Ambulatory Visit (HOSPITAL_COMMUNITY): Payer: Self-pay | Admitting: Nurse Practitioner

## 2021-01-15 ENCOUNTER — Other Ambulatory Visit (HOSPITAL_COMMUNITY): Payer: Self-pay

## 2021-01-15 MED ORDER — RIVAROXABAN 20 MG PO TABS: 20.0000 mg | ORAL_TABLET | Freq: Every day | ORAL | 0 refills | Status: DC

## 2021-01-18 ENCOUNTER — Encounter (HOSPITAL_COMMUNITY)
Admission: RE | Disposition: A | Discharge status: Home / Self Care | Payer: Self-pay | Source: Home / Self Care | Attending: Cardiovascular Disease

## 2021-01-18 ENCOUNTER — Ambulatory Visit (HOSPITAL_COMMUNITY): Payer: Medicare Other | Admitting: Anesthesiology

## 2021-01-18 ENCOUNTER — Other Ambulatory Visit: Payer: Self-pay

## 2021-01-18 ENCOUNTER — Encounter (HOSPITAL_COMMUNITY): Payer: Self-pay | Admitting: Cardiovascular Disease

## 2021-01-18 ENCOUNTER — Ambulatory Visit (HOSPITAL_COMMUNITY)
Admission: RE | Admit: 2021-01-18 | Discharge: 2021-01-18 | Disposition: A | Discharge status: Home / Self Care | Payer: Medicare Other | Attending: Cardiovascular Disease | Admitting: Cardiovascular Disease

## 2021-01-18 DIAGNOSIS — E785 Hyperlipidemia, unspecified: Secondary | ICD-10-CM | POA: Diagnosis not present

## 2021-01-18 DIAGNOSIS — Z87891 Personal history of nicotine dependence: Secondary | ICD-10-CM | POA: Insufficient documentation

## 2021-01-18 DIAGNOSIS — Z7901 Long term (current) use of anticoagulants: Secondary | ICD-10-CM | POA: Insufficient documentation

## 2021-01-18 DIAGNOSIS — I4819 Other persistent atrial fibrillation: Secondary | ICD-10-CM | POA: Diagnosis not present

## 2021-01-18 DIAGNOSIS — Z885 Allergy status to narcotic agent status: Secondary | ICD-10-CM | POA: Insufficient documentation

## 2021-01-18 DIAGNOSIS — Z79899 Other long term (current) drug therapy: Secondary | ICD-10-CM | POA: Diagnosis not present

## 2021-01-18 DIAGNOSIS — I480 Paroxysmal atrial fibrillation: Secondary | ICD-10-CM | POA: Diagnosis not present

## 2021-01-18 DIAGNOSIS — I214 Non-ST elevation (NSTEMI) myocardial infarction: Secondary | ICD-10-CM | POA: Diagnosis not present

## 2021-01-18 HISTORY — PX: CARDIOVERSION: SHX1299

## 2021-01-18 SURGERY — CARDIOVERSION
Anesthesia: General

## 2021-01-18 MED ORDER — LIDOCAINE 2% (20 MG/ML) 5 ML SYRINGE
INTRAMUSCULAR | Status: DC | PRN
  Administered 2021-01-18: 80 mg via INTRAVENOUS

## 2021-01-18 MED ORDER — SODIUM CHLORIDE 0.9 % IV SOLN: INTRAVENOUS | Status: DC | PRN

## 2021-01-18 MED ORDER — PROPOFOL 10 MG/ML IV BOLUS
INTRAVENOUS | Status: DC | PRN
Start: 2021-01-18 — End: 2021-01-18
  Administered 2021-01-18: 60 mg via INTRAVENOUS

## 2021-01-18 NOTE — CV Procedure (Signed)
Electrical Cardioversion Procedure Note AMARR PLINE [id number] 1941/04/13  Procedure: Electrical Cardioversion Indications:  Atrial Fibrillation  Procedure Details Consent: Risks of procedure as well as the alternatives and risks of each were explained to the (patient/caregiver).  Consent for procedure obtained. Time Out: Verified patient identification, verified procedure, site/side was marked, verified correct patient position, special equipment/implants available, medications/allergies/relevent history reviewed, required imaging and test results available.  Performed  Patient placed on cardiac monitor, pulse oximetry, supplemental oxygen as necessary.  Sedation given:  propofol Pacer pads placed anterior and posterior chest.  Cardioverted 1 time(s).  Cardioverted at [place].  Evaluation Findings: Post procedure EKG shows: NSR Complications: None Patient did tolerate procedure well.   Skeet Latch, MD 01/18/2021, 10:39 AM

## 2021-01-18 NOTE — Transfer of Care (Signed)
Immediate Anesthesia Transfer of Care Note  Patient: Caleb Wolf  Procedure(s) Performed: CARDIOVERSION  Patient Location: Endoscopy Unit  Anesthesia Type:General  Level of Consciousness: drowsy, patient cooperative and responds to stimulation  Airway & Oxygen Therapy: Patient Spontanous Breathing  Post-op Assessment: Report given to RN, Post -op Vital signs reviewed and stable and Patient moving all extremities X 4  Post vital signs: Reviewed and stable  Last Vitals:  Vitals Value Taken Time  BP 106/50   Temp    Pulse 65   Resp 12   SpO2 94     Last Pain:  Vitals:   01/18/21 0920  TempSrc: Oral  PainSc: 0-No pain         Complications: No notable events documented.

## 2021-01-18 NOTE — Interval H&P Note (Signed)
History and Physical Interval Note:  01/18/2021 10:35 AM  Caleb Wolf  has presented today for surgery, with the diagnosis of A-FIB.  The various methods of treatment have been discussed with the patient and family. After consideration of risks, benefits and other options for treatment, the patient has consented to  Procedure(s): CARDIOVERSION (N/A) as a surgical intervention.  The patient's history has been reviewed, patient examined, no change in status, stable for surgery.  I have reviewed the patient's chart and labs.  Questions were answered to the patient's satisfaction.     Skeet Latch, MD

## 2021-01-18 NOTE — Anesthesia Preprocedure Evaluation (Signed)
Anesthesia Evaluation  Patient identified by MRN, date of birth, ID band Patient awake    Reviewed: Allergy & Precautions, NPO status   Airway Mallampati: II  TM Distance: >3 FB Neck ROM: Full    Dental no notable dental hx.    Pulmonary sleep apnea , former smoker,    Pulmonary exam normal breath sounds clear to auscultation       Cardiovascular hypertension, + CAD, + Past MI and +CHF  Normal cardiovascular exam Rhythm:Irregular Rate:Normal     Neuro/Psych  Neuromuscular disease    GI/Hepatic GERD  ,  Endo/Other    Renal/GU Renal disease     Musculoskeletal   Abdominal (+) + obese,   Peds  Hematology  (+) anemia ,   Anesthesia Other Findings   Reproductive/Obstetrics                             Anesthesia Physical  Anesthesia Plan  ASA: III  Anesthesia Plan: General   Post-op Pain Management:    Induction: Intravenous  PONV Risk Score and Plan: 2 and Propofol infusion and Treatment may vary due to age or medical condition  Airway Management Planned: Nasal Cannula and Simple Face Mask  Additional Equipment:   Intra-op Plan:   Post-operative Plan:   Informed Consent: I have reviewed the patients History and Physical, chart, labs and discussed the procedure including the risks, benefits and alternatives for the proposed anesthesia with the patient or authorized representative who has indicated his/her understanding and acceptance.     Dental advisory given  Plan Discussed with: Anesthesiologist  Anesthesia Plan Comments:         Anesthesia Quick Evaluation

## 2021-01-18 NOTE — Anesthesia Postprocedure Evaluation (Signed)
Anesthesia Post Note  Patient: Caleb Wolf  Procedure(s) Performed: CARDIOVERSION     Patient location during evaluation: PACU Anesthesia Type: General Pain management: pain level controlled Vital Signs Assessment: post-procedure vital signs reviewed and stable Respiratory status: spontaneous breathing and respiratory function stable Cardiovascular status: stable Postop Assessment: no apparent nausea or vomiting Anesthetic complications: no   No notable events documented.  Last Vitals:  Vitals:   01/18/21 1055 01/18/21 1101  BP:  115/64  Pulse: 62 62  Resp: (!) 35 20  Temp:    SpO2: 97% 97%    Last Pain:  Vitals:   01/18/21 1101  TempSrc:   PainSc: 0-No pain                 Merlinda Frederick

## 2021-01-18 NOTE — Anesthesia Procedure Notes (Addendum)
Procedure Name: General with mask airway Date/Time: 01/18/2021 10:23 AM Performed by: Rande Brunt, CRNA Pre-anesthesia Checklist: Patient identified, Emergency Drugs available, Suction available, Patient being monitored and Timeout performed Patient Re-evaluated:Patient Re-evaluated prior to induction Oxygen Delivery Method: Ambu bag Preoxygenation: Pre-oxygenation with 100% oxygen Induction Type: IV induction Placement Confirmation: positive ETCO2 and CO2 detector Dental Injury: Teeth and Oropharynx as per pre-operative assessment

## 2021-01-21 ENCOUNTER — Encounter (HOSPITAL_COMMUNITY): Payer: Self-pay | Admitting: Cardiovascular Disease

## 2021-01-24 ENCOUNTER — Encounter (HOSPITAL_COMMUNITY): Payer: Self-pay | Admitting: Nurse Practitioner

## 2021-01-24 ENCOUNTER — Other Ambulatory Visit: Payer: Self-pay

## 2021-01-24 ENCOUNTER — Ambulatory Visit (HOSPITAL_COMMUNITY)
Admission: RE | Admit: 2021-01-24 | Discharge: 2021-01-24 | Disposition: A | Discharge status: Home / Self Care | Payer: Medicare Other | Source: Ambulatory Visit | Attending: Nurse Practitioner | Admitting: Nurse Practitioner

## 2021-01-24 VITALS — BP 130/50 | HR 58 | Ht 70.5 in | Wt 236.0 lb

## 2021-01-24 DIAGNOSIS — Z7901 Long term (current) use of anticoagulants: Secondary | ICD-10-CM | POA: Diagnosis not present

## 2021-01-24 DIAGNOSIS — Z885 Allergy status to narcotic agent status: Secondary | ICD-10-CM | POA: Diagnosis not present

## 2021-01-24 DIAGNOSIS — D6869 Other thrombophilia: Secondary | ICD-10-CM | POA: Diagnosis not present

## 2021-01-24 DIAGNOSIS — Z8249 Family history of ischemic heart disease and other diseases of the circulatory system: Secondary | ICD-10-CM | POA: Insufficient documentation

## 2021-01-24 DIAGNOSIS — Z87891 Personal history of nicotine dependence: Secondary | ICD-10-CM | POA: Diagnosis not present

## 2021-01-24 DIAGNOSIS — Z79899 Other long term (current) drug therapy: Secondary | ICD-10-CM | POA: Insufficient documentation

## 2021-01-24 DIAGNOSIS — I4819 Other persistent atrial fibrillation: Secondary | ICD-10-CM | POA: Diagnosis not present

## 2021-01-24 NOTE — Addendum Note (Signed)
Encounter addended by: Sherran Needs, NP on: 01/24/2021 1:49 PM  Actions taken: Clinical Note Signed

## 2021-01-24 NOTE — Progress Notes (Addendum)
Primary Care Physician: Caleb Noble, MD Referring Physician: [name] is a 80 y.o. male with a h/o afib, s/p ablation 2019. He had a cardioversion in October  and recently on f/u with Dr. Rayann Wolf 06/04/20 he was in [place].  He had a back injection the next day and restarted blood thinner 1/4. He started back in afib on 1/6. He is taking cardizem 30 mg every 4 hours with reasonable rate control.   F/u, 07/13/20,  successful cardioversion 2/4. He did have frequent PAC's after the procedure but is in SR today. He continues the 30 mg Cardizem q 6 hours. I will change to 120 mg daily.   F/u in afib clinic, 10/17/20, as he went into afib earlier in the week. Only trigger may have been cellulitis of left leg but he is at the end of antibiotics and is much improved. He is rate controlled for the most part and is symptomatic with fatigue in afib. On last visit, we discussed if increase in afib burden he may need repeat ablation and he stated that Dr. Rayann Wolf has also mentioned this as well. He would like to go back to him to discuss. He has been treated with amiodarone in the past but would  prefer to not  restart as for the S.E. profile associated with drug.   F/u in afib clinic, 01/10/21. He had an ablation one month ago. Had a few short episodes of afib but  went into persistent  afib last Sunday. He feels fatigued and winded with activity when in afib. No missed xarelto with CHA2DS2VASc  score of at least 5.   F/u in afib clinic, 01/24/21. He had a successful cardioversion and remains in SR. Despite this he remains fatigue. He has not noted any irregular heart beat. His weight is up 5 kg's. He did not take lasix this am as he had this appointment.   Today, he denies symptoms of palpitations, chest pain, shortness of breath, orthopnea, PND, lower extremity edema, dizziness, presyncope, syncope, or neurologic sequela. The patient is tolerating medications without difficulties and is otherwise without  complaint today.   Past Medical History:  Diagnosis Date   Arthritis    Basal cell carcinoma    Carotid stenosis    Coronary artery disease    Multvessel s/p CABG 2015   Enlarged prostate    Essential hypertension    GERD (gastroesophageal reflux disease)    Gout    History of colon polyps    History of kidney stones    Hyperlipidemia    Lumbar disc disease    Persistent atrial fibrillation (HCC)    Sleep apnea    CPAP   Past Surgical History:  Procedure Laterality Date   ABLATION OF DYSRHYTHMIC FOCUS  07/28/2017   ATRIAL FIBRILLATION ABLATION N/A 07/28/2017   Procedure: ATRIAL FIBRILLATION ABLATION;  Surgeon: Thompson Grayer, MD;  Location: [place] [facility];  Service: Cardiovascular;  Laterality: N/A;   ATRIAL FIBRILLATION ABLATION N/A 12/04/2020   Procedure: ATRIAL FIBRILLATION ABLATION;  Surgeon: Thompson Grayer, MD;  Location: [place] [facility];  Service: Cardiovascular;  Laterality: N/A;   BACK SURGERY     CARDIAC CATHETERIZATION  05/22/2014   Procedure: IABP INSERTION;  Surgeon: Leonie Man, MD;  Location: [facility] CATH LAB;  Service: Cardiovascular;;   CARDIOVERSION N/A 04/29/2017   Procedure: CARDIOVERSION;  Surgeon: Satira Sark, MD;  [facility];  Service: Cardiovascular;  Laterality: N/A;  CARDIOVERSION N/A 08/13/2017   Procedure: CARDIOVERSION;  Surgeon: Pixie Casino, MD;  Location: [facility] ENDOSCOPY;  Service: Cardiovascular;  Laterality: N/A;   CARDIOVERSION N/A 08/15/2019   Procedure: CARDIOVERSION;  Surgeon: Dorothy Spark, MD;  Location: [facility] ENDOSCOPY;  Service: Cardiovascular;  Laterality: N/A;   CARDIOVERSION N/A 03/09/2020   Procedure: CARDIOVERSION;  Surgeon: Buford Dresser, MD;  Location: [place];  Service: Cardiovascular;  Laterality: N/A;   CARDIOVERSION N/A 07/06/2020   Procedure: CARDIOVERSION;  Surgeon: Sanda Klein, MD;  Location: [place];  Service: Cardiovascular;  Laterality: N/A;   CARDIOVERSION N/A 01/18/2021    Procedure: CARDIOVERSION;  Surgeon: Skeet Latch, MD;  Location: [place];  Service: Cardiovascular;  Laterality: N/A;   Cataract surgery Right    COLONOSCOPY     COLONOSCOPY N/A 12/30/2017   Procedure: COLONOSCOPY;  Surgeon: Rogene Houston, MD;  [facility];  Service: Endoscopy;  Laterality: N/A;   CORONARY ARTERY BYPASS GRAFT N/A 05/22/2014   Procedure: CORONARY ARTERY BYPASS GRAFTING (CABG) times three using left internal mammary and right saphenous vein.;  Surgeon: Melrose Nakayama, MD;  Location: [place];  Service: Open Heart Surgery;  Laterality: N/A;   ENDARTERECTOMY Left 02/17/2014   Procedure: ENDARTERECTOMY CAROTID WITH PATCH ANGIOPLASTY;  Surgeon: Mal Misty, MD;  Location: [place];  Service: Vascular;  Laterality: Left;   ESOPHAGEAL DILATION N/A 08/22/2015   Procedure: ESOPHAGEAL DILATION;  Surgeon: Rogene Houston, MD;  [facility];  Service: Endoscopy;  Laterality: N/A;   ESOPHAGOGASTRODUODENOSCOPY N/A 08/22/2015   Procedure: ESOPHAGOGASTRODUODENOSCOPY (EGD);  Surgeon: Rogene Houston, MD;  [facility];  Service: Endoscopy;  Laterality: N/A;  12:45 - moved to 1:55 - Ann notified pt   ESOPHAGOGASTRODUODENOSCOPY N/A 12/30/2017   Procedure: ESOPHAGOGASTRODUODENOSCOPY (EGD);  Surgeon: Rogene Houston, MD;  [facility];  Service: Endoscopy;  Laterality: N/A;  200   EYE SURGERY     cataract extraction (right) with repair macular tear , with IOL     right   FRACTURE SURGERY     bilateral wrist fractures- one ORIF   JOINT REPLACEMENT  2011   left knee   LEFT HEART CATHETERIZATION WITH CORONARY ANGIOGRAM N/A 05/22/2014   Procedure: LEFT HEART CATHETERIZATION WITH CORONARY ANGIOGRAM;  Surgeon: Leonie Man, MD;  Location: [facility] CATH LAB;  Service: Cardiovascular;  Laterality: N/A;   LITHOTRIPSY     PARS PLANA VITRECTOMY W/ REPAIR OF MACULAR HOLE     RHINOPLASTY     TEE WITHOUT CARDIOVERSION N/A 04/29/2017   Procedure:  TRANSESOPHAGEAL ECHOCARDIOGRAM (TEE) WITH PROPOFOL;  Surgeon: Satira Sark, MD;  [facility];  Service: Cardiovascular;  Laterality: N/A;   TONSILLECTOMY     TOTAL HIP ARTHROPLASTY  12/08/2011   Procedure: TOTAL HIP ARTHROPLASTY;  Surgeon: Gearlean Alf, MD;  [facility]: [facility];  Service: Orthopedics;  Laterality: Right;   UPPER GI ENDOSCOPY  12/18/2015   Procedure: UPPER GI ENDOSCOPY;  Surgeon: Ralene Ok, MD;  [facility]: [facility];  Service: General;;   WRIST SURGERY Right 80yr ago   WRIST SURGERY Left     Current Outpatient Medications  Medication Sig Dispense Refill   acetaminophen (TYLENOL) 500 MG tablet Take 1,000 mg by mouth every 6 (six) hours as needed for moderate pain.     amoxicillin (AMOXIL) 500 MG capsule Take 2,000 mg by mouth See admin instructions. Take 4 capsules (2000 mg) by mouth before dental procedures     atorvastatin (LIPITOR) 80  MG tablet Take 1 tablet (80 mg total) by mouth every evening. (Patient taking differently: Take 80 mg by mouth every morning.) 90 tablet 3   colchicine 0.6 MG tablet Take 0.6 mg by mouth daily as needed (for gout flare up).     Cyanocobalamin (B-12) 1000 MCG SUBL Place 1,000 mcg under the tongue in the morning.     cyclobenzaprine (FLEXERIL) 10 MG tablet Take 10 mg by mouth 2 (two) times daily as needed for muscle spasms.     diltiazem (CARDIZEM CD) 240 MG 24 hr capsule Take 1 capsule (240 mg total) by mouth daily. 90 capsule 3   diltiazem (CARDIZEM) 30 MG tablet Take 1 tablet every 4 hours AS NEEDED for heart rate >100 30 tablet 1   doxazosin (CARDURA) 4 MG tablet Take 4 mg by mouth in the morning.  4   finasteride (PROSCAR) 5 MG tablet Take 5 mg by mouth every evening.      fluticasone (FLONASE) 50 MCG/ACT nasal spray Place 1 spray into both nostrils daily as needed for allergies or rhinitis.     furosemide (LASIX) 40 MG tablet Take 1 tablet (40 mg total) by mouth daily.     lisinopril (PRINIVIL,ZESTRIL) 40 MG tablet Take 40  mg by mouth in the morning.     nitroGLYCERIN (NITROSTAT) 0.4 MG SL tablet Place 1 tablet (0.4 mg total) under the tongue every 5 (five) minutes x 3 doses as needed for chest pain. 75 tablet 3   pantoprazole (PROTONIX) 40 MG tablet Take 1 tablet (40 mg total) by mouth daily before breakfast. (Patient taking differently: Take 40 mg by mouth every evening.) 90 tablet 3   rivaroxaban (XARELTO) 20 MG TABS tablet Take 1 tablet (20 mg total) by mouth daily with supper. (Patient taking differently: Take 20 mg by mouth in the morning.) 21 tablet 0   No current facility-administered medications for this encounter.    Allergies  Allergen Reactions   Codeine Sulfate Nausea Only    Social History   Socioeconomic History   Marital status: Married    Spouse name: Not on file   Number of children: Not on file   Years of education: Not on file   Highest education level: Not on file  Occupational History   Occupation: retired    Comment: [employer]  Tobacco Use   Smoking status: Former    Packs/day: 1.50    Years: 25.00    Pack years: 37.50    Types: Cigarettes    Start date: 08/08/1960    Quit date: 06/02/1982    Years since quitting: 38.6   Smokeless tobacco: Never   Tobacco comments:    quit smoking 30+yrs ago  Vaping Use   Vaping Use: Never used  Substance and Sexual Activity   Alcohol use: No    Alcohol/week: 0.0 standard drinks    Comment: occasionally    Drug use: No   Sexual activity: Yes  Other Topics Concern   Not on file  Social History Narrative   Not on file   Social Determinants of Health   Financial Resource Strain: Not on file  Food Insecurity: Not on file  Transportation Needs: Not on file  Physical Activity: Not on file  Stress: Not on file  Social Connections: Not on file  Intimate Partner Violence: Not on file    Family History  Problem Relation Age of Onset   Allergies Mother    Heart disease Mother    Hypertension  Mother    Heart disease  Father        MI    ROS- All systems are reviewed and negative except as per the HPI above  Physical Exam: Vitals:   01/24/21 1140  BP: (!) 130/50  Pulse: (!) 58  Weight: 107 kg  Height: 5' 10.5" (1.791 m)   Wt Readings from Last 3 Encounters:  01/24/21 107 kg  01/18/21 102.1 kg  01/10/21 105.1 kg    Labs: Lab Results  Component Value Date   NA 138 01/14/2021   K 4.2 01/14/2021   CL 105 01/14/2021   CO2 25 01/14/2021   GLUCOSE 109 (H) 01/14/2021   BUN 23 01/14/2021   CREATININE 1.45 (H) 01/14/2021   CALCIUM 8.9 01/14/2021   PHOS 3.3 04/29/2017   MG 1.9 04/30/2017   Lab Results  Component Value Date   INR 2.2 (H) 08/15/2019   Lab Results  Component Value Date   CHOL 134 03/22/2015   HDL 41 03/22/2015   LDLCALC 75 03/22/2015   TRIG 92 03/22/2015     GEN- The patient is well appearing, alert and oriented x 3 today.   Head- normocephalic, atraumatic Eyes-  Sclera clear, conjunctiva pink Ears- hearing intact Oropharynx- clear Neck- supple, no JVP Lymph- no cervical lymphadenopathy Lungs- Clear to ausculation bilaterally, normal work of breathing Heart- regular rate and rhythm, no murmurs, rubs or gallops, PMI not laterally displaced GI- soft, NT, ND, + BS Extremities- no clubbing, cyanosis, or edema MS- no significant deformity or atrophy Skin- no rash or lesion Psych- euthymic mood, full affect Neuro- strength and sensation are intact  EKG-  sinus brady at 58 bpm, pr int 178 ms, qrs int 90 ms, qtc 426    Assessment and Plan: 1. Afib  Successful  cardioversion in  March 2021 and again 03/09/20 Recent  steroid back injection may have triggered afib in January Successful cardioversion 06/19/2020 S/p ablation one month ago and now back in afib since last Sunday  Now successful cardioversion 8/19 and is staying in rhythm   Continue 240 mg cardizem daily, and 30 mg cardizem as needed     2. CHA2DS2VASc score of 6 Continue  xarelto 20 mg daily   3.  Weight gain Appears to have around 5 extra kilogram's on board since cardioversion  He did not take lasix this am Go ahead and take now He states his normal weight is around 225 lbs, if still elevated in the am, take 1/12 tab of lasix up to 3 days until weight returns to normal  Call in office if weight does not return to normal with the above plan   Caleb Wolf, [facility] [address] [place], [place] [address] [phone]

## 2021-01-29 ENCOUNTER — Other Ambulatory Visit (HOSPITAL_COMMUNITY): Payer: Self-pay

## 2021-01-29 MED ORDER — RIVAROXABAN 20 MG PO TABS: 20.0000 mg | ORAL_TABLET | Freq: Every day | ORAL | 0 refills | Status: DC

## 2021-01-29 NOTE — Addendum Note (Signed)
Addended by: Maryln Manuel F on: 01/29/2021 03:50 PM   Modules accepted: Orders

## 2021-01-31 DIAGNOSIS — L570 Actinic keratosis: Secondary | ICD-10-CM | POA: Diagnosis not present

## 2021-02-05 NOTE — H&P (Signed)
Per Clinic note 01/10/21 by Caleb Palau, NP  Primary Care Physician: Caleb Noble, MD Referring Physician: [name] is a 80 y.o. male with a h/o afib, s/p ablation 2019. He had a cardioversion in October  and recently on f/u with [name] 06/04/20 he was in [place].  He had a back injection the next day and restarted blood thinner 1/4. He started back in afib on 1/6. He is taking cardizem 30 mg every 4 hours with reasonable rate control.   F/u, 07/13/20,  successful cardioversion 2/4. He did have frequent PAC's after the procedure but is in SR today. He continues the 30 mg Cardizem q 6 hours. I will change to 120 mg daily.   F/u in afib clinic, 10/17/20, as he went into afib earlier in the week. Only trigger may have been cellulitis of left leg but he is at the end of antibiotics and is much improved. He is rate controlled for the most part and is symptomatic with fatigue in afib. On last visit, we discussed if increase in afib burden he may need repeat ablation and he stated that [name] has also mentioned this as well. He would like to go back to him to discuss. He has been treated with amiodarone in the past but would  prefer to not  restart as for the S.E. profile associated with drug.   F/u in afib clinic, 01/10/21. He had an ablation one month ago. Had a few short episodes of afib but  went into persistent  afib last Sunday. He feels fatigued and winded with activity when in afib. No missed xarelto with CHA2DS2VASc  score of at least 5.    Since that appointment he had recurrent atrial fibrillation and called the office 8/15.  He presents today for DCCV.   Past Medical History:  Diagnosis Date   Arthritis    Basal cell carcinoma    Carotid stenosis    Coronary artery disease    Multvessel s/p CABG 2015   Enlarged prostate    Essential hypertension    GERD (gastroesophageal reflux disease)    Gout    History of colon polyps    History of kidney stones    Hyperlipidemia     Lumbar disc disease    Persistent atrial fibrillation (HCC)    Sleep apnea    CPAP   Past Surgical History:  Procedure Laterality Date   ABLATION OF DYSRHYTHMIC FOCUS  07/28/2017   ATRIAL FIBRILLATION ABLATION N/A 07/28/2017   Procedure: ATRIAL FIBRILLATION ABLATION;  Surgeon: Thompson Grayer, MD;  Location: [name] [facility];  Service: Cardiovascular;  Laterality: N/A;   ATRIAL FIBRILLATION ABLATION N/A 12/04/2020   Procedure: ATRIAL FIBRILLATION ABLATION;  Surgeon: Thompson Grayer, MD;  Location: [place] [facility];  Service: Cardiovascular;  Laterality: N/A;   BACK SURGERY     CARDIAC CATHETERIZATION  05/22/2014   Procedure: IABP INSERTION;  Surgeon: Leonie Man, MD;  Location: [facility] CATH LAB;  Service: Cardiovascular;;   CARDIOVERSION N/A 04/29/2017   Procedure: CARDIOVERSION;  Surgeon: Satira Sark, MD;  [facility];  Service: Cardiovascular;  Laterality: N/A;   CARDIOVERSION N/A 08/13/2017   Procedure: CARDIOVERSION;  Surgeon: Pixie Casino, MD;  Location: [facility] ENDOSCOPY;  Service: Cardiovascular;  Laterality: N/A;   CARDIOVERSION N/A 08/15/2019   Procedure: CARDIOVERSION;  Surgeon: Dorothy Spark, MD;  Location: [facility] ENDOSCOPY;  Service: Cardiovascular;  Laterality: N/A;   CARDIOVERSION N/A 03/09/2020  Procedure: CARDIOVERSION;  Surgeon: Buford Dresser, MD;  Location: [facility] ENDOSCOPY;  Service: Cardiovascular;  Laterality: N/A;   CARDIOVERSION N/A 07/06/2020   Procedure: CARDIOVERSION;  Surgeon: Sanda Klein, MD;  Location: [place];  Service: Cardiovascular;  Laterality: N/A;   CARDIOVERSION N/A 01/18/2021   Procedure: CARDIOVERSION;  Surgeon: Skeet Latch, MD;  Location: [place];  Service: Cardiovascular;  Laterality: N/A;   Cataract surgery Right    COLONOSCOPY     COLONOSCOPY N/A 12/30/2017   Procedure: COLONOSCOPY;  Surgeon: Rogene Houston, MD;  [facility];  Service: Endoscopy;  Laterality: N/A;   CORONARY ARTERY BYPASS  GRAFT N/A 05/22/2014   Procedure: CORONARY ARTERY BYPASS GRAFTING (CABG) times three using left internal mammary and right saphenous vein.;  Surgeon: Melrose Nakayama, MD;  Location: [place];  Service: Open Heart Surgery;  Laterality: N/A;   ENDARTERECTOMY Left 02/17/2014   Procedure: ENDARTERECTOMY CAROTID WITH PATCH ANGIOPLASTY;  Surgeon: Mal Misty, MD;  Location: [place];  Service: Vascular;  Laterality: Left;   ESOPHAGEAL DILATION N/A 08/22/2015   Procedure: ESOPHAGEAL DILATION;  Surgeon: Rogene Houston, MD;  [facility];  Service: Endoscopy;  Laterality: N/A;   ESOPHAGOGASTRODUODENOSCOPY N/A 08/22/2015   Procedure: ESOPHAGOGASTRODUODENOSCOPY (EGD);  Surgeon: Rogene Houston, MD;  [facility];  Service: Endoscopy;  Laterality: N/A;  12:45 - moved to 1:55 - Ann notified pt   ESOPHAGOGASTRODUODENOSCOPY N/A 12/30/2017   Procedure: ESOPHAGOGASTRODUODENOSCOPY (EGD);  Surgeon: Rogene Houston, MD;  [facility];  Service: Endoscopy;  Laterality: N/A;  200   EYE SURGERY     cataract extraction (right) with repair macular tear , with IOL     right   FRACTURE SURGERY     bilateral wrist fractures- one ORIF   JOINT REPLACEMENT  2011   left knee   LEFT HEART CATHETERIZATION WITH CORONARY ANGIOGRAM N/A 05/22/2014   Procedure: LEFT HEART CATHETERIZATION WITH CORONARY ANGIOGRAM;  Surgeon: Leonie Man, MD;  Location: [facility] CATH LAB;  Service: Cardiovascular;  Laterality: N/A;   LITHOTRIPSY     PARS PLANA VITRECTOMY W/ REPAIR OF MACULAR HOLE     RHINOPLASTY     TEE WITHOUT CARDIOVERSION N/A 04/29/2017   Procedure: TRANSESOPHAGEAL ECHOCARDIOGRAM (TEE) WITH PROPOFOL;  Surgeon: Satira Sark, MD;  [facility];  Service: Cardiovascular;  Laterality: N/A;   TONSILLECTOMY     TOTAL HIP ARTHROPLASTY  12/08/2011   Procedure: TOTAL HIP ARTHROPLASTY;  Surgeon: Gearlean Alf, MD;  [facility];  Service: Orthopedics;  Laterality: Right;   UPPER GI  ENDOSCOPY  12/18/2015   Procedure: UPPER GI ENDOSCOPY;  Surgeon: Ralene Ok, MD;  [facility];  Service: General;;   WRIST SURGERY Right 50yr ago   WRIST SURGERY Left     No current facility-administered medications for this encounter.   Current Outpatient Medications  Medication Sig Dispense Refill   acetaminophen (TYLENOL) 500 MG tablet Take 1,000 mg by mouth every 6 (six) hours as needed for moderate pain.     atorvastatin (LIPITOR) 80 MG tablet Take 1 tablet (80 mg total) by mouth every evening. (Patient taking differently: Take 80 mg by mouth every morning.) 90 tablet 3   colchicine 0.6 MG tablet Take 0.6 mg by mouth daily as needed (for gout flare up).     Cyanocobalamin (B-12) 1000 MCG SUBL Place 1,000 mcg under the tongue in the morning.     cyclobenzaprine (FLEXERIL) 10 MG tablet Take 10 mg  by mouth 2 (two) times daily as needed for muscle spasms.     diltiazem (CARDIZEM CD) 240 MG 24 hr capsule Take 1 capsule (240 mg total) by mouth daily. 90 capsule 3   diltiazem (CARDIZEM) 30 MG tablet Take 1 tablet every 4 hours AS NEEDED for heart rate >100 30 tablet 1   doxazosin (CARDURA) 4 MG tablet Take 4 mg by mouth in the morning.  4   finasteride (PROSCAR) 5 MG tablet Take 5 mg by mouth every evening.      fluticasone (FLONASE) 50 MCG/ACT nasal spray Place 1 spray into both nostrils daily as needed for allergies or rhinitis.     furosemide (LASIX) 40 MG tablet Take 1 tablet (40 mg total) by mouth daily.     lisinopril (PRINIVIL,ZESTRIL) 40 MG tablet Take 40 mg by mouth in the morning.     pantoprazole (PROTONIX) 40 MG tablet Take 1 tablet (40 mg total) by mouth daily before breakfast. (Patient taking differently: Take 40 mg by mouth every evening.) 90 tablet 3   amoxicillin (AMOXIL) 500 MG capsule Take 2,000 mg by mouth See admin instructions. Take 4 capsules (2000 mg) by mouth before dental procedures     nitroGLYCERIN (NITROSTAT) 0.4 MG SL tablet Place 1 tablet (0.4 mg total)  under the tongue every 5 (five) minutes x 3 doses as needed for chest pain. 75 tablet 3   rivaroxaban (XARELTO) 20 MG TABS tablet Take 1 tablet (20 mg total) by mouth daily with supper. 14 tablet 0    Allergies  Allergen Reactions   Codeine Sulfate Nausea Only    Social History   Socioeconomic History   Marital status: Married    Spouse name: Not on file   Number of children: Not on file   Years of education: Not on file   Highest education level: Not on file  Occupational History   Occupation: retired    Comment: [employer]  Tobacco Use   Smoking status: Former    Packs/day: 1.50    Years: 25.00    Pack years: 37.50    Types: Cigarettes    Start date: 08/08/1960    Quit date: 06/02/1982    Years since quitting: 38.7   Smokeless tobacco: Never   Tobacco comments:    quit smoking 30+yrs ago  Vaping Use   Vaping Use: Never used  Substance and Sexual Activity   Alcohol use: No    Alcohol/week: 0.0 standard drinks    Comment: occasionally    Drug use: No   Sexual activity: Yes  Other Topics Concern   Not on file  Social History Narrative   Not on file   Social Determinants of Health   Financial Resource Strain: Not on file  Food Insecurity: Not on file  Transportation Needs: Not on file  Physical Activity: Not on file  Stress: Not on file  Social Connections: Not on file  Intimate Partner Violence: Not on file    Family History  Problem Relation Age of Onset   Allergies Mother    Heart disease Mother    Hypertension Mother    Heart disease Father        MI    ROS- All systems are reviewed and negative except as per the HPI above  Physical Exam: Vitals:   01/18/21 1048 01/18/21 1050 01/18/21 1055 01/18/21 1101  BP:  (!) 102/56  115/64  Pulse: (!) 57 62 62 62  Resp: 13 18 (!) 35 20  Temp:      TempSrc:      SpO2: 98% 95% 97% 97%  Weight:      Height:       Wt Readings from Last 3 Encounters:  01/24/21 107 kg  01/18/21 102.1 kg   01/10/21 105.1 kg    Labs: Lab Results  Component Value Date   NA 138 01/14/2021   K 4.2 01/14/2021   CL 105 01/14/2021   CO2 25 01/14/2021   GLUCOSE 109 (H) 01/14/2021   BUN 23 01/14/2021   CREATININE 1.45 (H) 01/14/2021   CALCIUM 8.9 01/14/2021   PHOS 3.3 04/29/2017   MG 1.9 04/30/2017   Lab Results  Component Value Date   INR 2.2 (H) 08/15/2019   Lab Results  Component Value Date   CHOL 134 03/22/2015   HDL 41 03/22/2015   LDLCALC 75 03/22/2015   TRIG 92 03/22/2015     GEN- The patient is well appearing, alert and oriented x 3 today.   Head- normocephalic, atraumatic Eyes-  Sclera clear, conjunctiva pink Ears- hearing intact Oropharynx- clear Neck- supple, no JVP Lymph- no cervical lymphadenopathy Lungs- Clear to ausculation bilaterally, normal work of breathing Heart- irregular rate and rhythm, no murmurs, rubs or gallops, PMI not laterally displaced GI- soft, NT, ND, + BS Extremities- no clubbing, cyanosis, or edema MS- no significant deformity or atrophy Skin- no rash or lesion Psych- euthymic mood, full affect Neuro- strength and sensation are intact  EKG-   afib at 107 bpm, qrs int 90 ms, qtc 445 ms   Caleb Wolf is a 80 y.o. male who has presented today for surgery, with the diagnosis of atrial fibrillation.  The various methods of treatment have been discussed with the patient and family. After consideration of risks, benefits and other options for treatment, the patient has consented to  Procedure(s): TRANSESOPHAGEAL ECHOCARDIOGRAM (TEE) (N/A) as a surgical intervention .  The patient's history has been reviewed, patient examined, no change in status, stable for surgery.  I have reviewed the patient's chart and labs.  Questions were answered to the patient's satisfaction.    Caleb Wolf C. Oval Linsey, MD, [facility]  01/18/21 12:48 PM

## 2021-02-18 ENCOUNTER — Other Ambulatory Visit: Payer: Self-pay | Admitting: Student

## 2021-03-08 DIAGNOSIS — Z79899 Other long term (current) drug therapy: Secondary | ICD-10-CM | POA: Diagnosis not present

## 2021-03-08 DIAGNOSIS — I5022 Chronic systolic (congestive) heart failure: Secondary | ICD-10-CM | POA: Diagnosis not present

## 2021-03-08 DIAGNOSIS — I480 Paroxysmal atrial fibrillation: Secondary | ICD-10-CM | POA: Diagnosis not present

## 2021-03-13 ENCOUNTER — Ambulatory Visit (INDEPENDENT_AMBULATORY_CARE_PROVIDER_SITE_OTHER): Payer: Medicare Other | Admitting: Internal Medicine

## 2021-03-13 ENCOUNTER — Other Ambulatory Visit: Payer: Self-pay

## 2021-03-13 VITALS — BP 112/50 | HR 67 | Ht 70.0 in | Wt 230.0 lb

## 2021-03-13 DIAGNOSIS — G4733 Obstructive sleep apnea (adult) (pediatric): Secondary | ICD-10-CM | POA: Diagnosis not present

## 2021-03-13 DIAGNOSIS — I4819 Other persistent atrial fibrillation: Secondary | ICD-10-CM | POA: Diagnosis not present

## 2021-03-13 DIAGNOSIS — I2510 Atherosclerotic heart disease of native coronary artery without angina pectoris: Secondary | ICD-10-CM | POA: Diagnosis not present

## 2021-03-13 DIAGNOSIS — Z951 Presence of aortocoronary bypass graft: Secondary | ICD-10-CM | POA: Diagnosis not present

## 2021-03-13 DIAGNOSIS — I10 Essential (primary) hypertension: Secondary | ICD-10-CM | POA: Diagnosis not present

## 2021-03-13 NOTE — Progress Notes (Signed)
PCP: Asencion Noble, MD  Caleb Wolf is a 80 y.o. male who presents today for routine electrophysiology followup.  Since his recent afib ablation, the patient reports doing very well.  he denies procedure related complications and is pleased with the results of the procedure.  SOB is stable.  Today, he denies symptoms of palpitations, chest pain,  lower extremity edema, dizziness, presyncope, or syncope.  The patient is otherwise without complaint today.   Past Medical History:  Diagnosis Date   Arthritis    Basal cell carcinoma    Carotid stenosis    Coronary artery disease    Multvessel s/p CABG 2015   Enlarged prostate    Essential hypertension    GERD (gastroesophageal reflux disease)    Gout    History of colon polyps    History of kidney stones    Hyperlipidemia    Lumbar disc disease    Persistent atrial fibrillation (HCC)    Sleep apnea    CPAP   Past Surgical History:  Procedure Laterality Date   ABLATION OF DYSRHYTHMIC FOCUS  07/28/2017   ATRIAL FIBRILLATION ABLATION N/A 07/28/2017   Procedure: ATRIAL FIBRILLATION ABLATION;  Surgeon: Thompson Grayer, MD;  Location: [place] [facility];  Service: Cardiovascular;  Laterality: N/A;   ATRIAL FIBRILLATION ABLATION N/A 12/04/2020   Procedure: ATRIAL FIBRILLATION ABLATION;  Surgeon: Thompson Grayer, MD;  Location: [place] [facility];  Service: Cardiovascular;  Laterality: N/A;   BACK SURGERY     CARDIAC CATHETERIZATION  05/22/2014   Procedure: IABP INSERTION;  Surgeon: Leonie Man, MD;  Location: [facility] CATH [facility];  Service: Cardiovascular;;   CARDIOVERSION N/A 04/29/2017   Procedure: CARDIOVERSION;  Surgeon: Satira Sark, MD;  [facility];  Service: Cardiovascular;  Laterality: N/A;   CARDIOVERSION N/A 08/13/2017   Procedure: CARDIOVERSION;  Surgeon: Pixie Casino, MD;  Location: [place];  Service: Cardiovascular;  Laterality: N/A;   CARDIOVERSION N/A 08/15/2019   Procedure: CARDIOVERSION;  Surgeon: Dorothy Spark, MD;  Location: [facility] ENDOSCOPY;  Service: Cardiovascular;  Laterality: N/A;   CARDIOVERSION N/A 03/09/2020   Procedure: CARDIOVERSION;  Surgeon: Buford Dresser, MD;  Location: [place];  Service: Cardiovascular;  Laterality: N/A;   CARDIOVERSION N/A 07/06/2020   Procedure: CARDIOVERSION;  Surgeon: Sanda Klein, MD;  Location: [place];  Service: Cardiovascular;  Laterality: N/A;   CARDIOVERSION N/A 01/18/2021   Procedure: CARDIOVERSION;  Surgeon: Skeet Latch, MD;  Location: [place];  Service: Cardiovascular;  Laterality: N/A;   Cataract surgery Right    COLONOSCOPY     COLONOSCOPY N/A 12/30/2017   Procedure: COLONOSCOPY;  Surgeon: Rogene Houston, MD;  [facility];  Service: Endoscopy;  Laterality: N/A;   CORONARY ARTERY BYPASS GRAFT N/A 05/22/2014   Procedure: CORONARY ARTERY BYPASS GRAFTING (CABG) times three using left internal mammary and right saphenous vein.;  Surgeon: Melrose Nakayama, MD;  Location: [place];  Service: Open Heart Surgery;  Laterality: N/A;   ENDARTERECTOMY Left 02/17/2014   Procedure: ENDARTERECTOMY CAROTID WITH PATCH ANGIOPLASTY;  Surgeon: Mal Misty, MD;  Location: [place];  Service: Vascular;  Laterality: Left;   ESOPHAGEAL DILATION N/A 08/22/2015   Procedure: ESOPHAGEAL DILATION;  Surgeon: Rogene Houston, MD;  [facility];  Service: Endoscopy;  Laterality: N/A;   ESOPHAGOGASTRODUODENOSCOPY N/A 08/22/2015   Procedure: ESOPHAGOGASTRODUODENOSCOPY (EGD);  Surgeon: Rogene Houston, MD;  [facility];  Service: Endoscopy;  Laterality: N/A;  12:45 - moved to 1:55 -  Ann notified pt   ESOPHAGOGASTRODUODENOSCOPY N/A 12/30/2017   Procedure: ESOPHAGOGASTRODUODENOSCOPY (EGD);  Surgeon: Rogene Houston, MD;  [facility];  Service: Endoscopy;  Laterality: N/A;  200   EYE SURGERY     cataract extraction (right) with repair macular tear , with IOL     right   FRACTURE SURGERY     bilateral wrist  fractures- one ORIF   JOINT REPLACEMENT  2011   left knee   LEFT HEART CATHETERIZATION WITH CORONARY ANGIOGRAM N/A 05/22/2014   Procedure: LEFT HEART CATHETERIZATION WITH CORONARY ANGIOGRAM;  Surgeon: Leonie Man, MD;  Location: [facility] CATH LAB;  Service: Cardiovascular;  Laterality: N/A;   LITHOTRIPSY     PARS PLANA VITRECTOMY W/ REPAIR OF MACULAR HOLE     RHINOPLASTY     TEE WITHOUT CARDIOVERSION N/A 04/29/2017   Procedure: TRANSESOPHAGEAL ECHOCARDIOGRAM (TEE) WITH PROPOFOL;  Surgeon: Satira Sark, MD;  [facility];  Service: Cardiovascular;  Laterality: N/A;   TONSILLECTOMY     TOTAL HIP ARTHROPLASTY  12/08/2011   Procedure: TOTAL HIP ARTHROPLASTY;  Surgeon: Gearlean Alf, MD;  Location: [facility];  Service: Orthopedics;  Laterality: Right;   UPPER GI ENDOSCOPY  12/18/2015   Procedure: UPPER GI ENDOSCOPY;  Surgeon: Ralene Ok, MD;  Location: [facility];  Service: General;;   WRIST SURGERY Right 3yrs ago   WRIST SURGERY Left     ROS- all systems are personally reviewed and negatives except as per HPI above  Current Outpatient Medications  Medication Sig Dispense Refill   acetaminophen (TYLENOL) 500 MG tablet Take 1,000 mg by mouth every 6 (six) hours as needed for moderate pain.     amoxicillin (AMOXIL) 500 MG capsule Take 2,000 mg by mouth See admin instructions. Take 4 capsules (2000 mg) by mouth before dental procedures     atorvastatin (LIPITOR) 80 MG tablet Take 1 tablet (80 mg total) by mouth every evening. (Patient taking differently: Take 80 mg by mouth every morning.) 90 tablet 3   colchicine 0.6 MG tablet Take 0.6 mg by mouth daily as needed (for gout flare up).     Cyanocobalamin (B-12) 1000 MCG SUBL Place 1,000 mcg under the tongue in the morning.     cyclobenzaprine (FLEXERIL) 10 MG tablet Take 10 mg by mouth 2 (two) times daily as needed for muscle spasms.     diltiazem (CARDIZEM CD) 240 MG 24 hr capsule Take 1 capsule (240 mg total) by mouth daily. 90  capsule 3   diltiazem (CARDIZEM) 30 MG tablet Take 1 tablet every 4 hours AS NEEDED for heart rate >100 30 tablet 1   doxazosin (CARDURA) 4 MG tablet Take 4 mg by mouth in the morning.  4   finasteride (PROSCAR) 5 MG tablet Take 5 mg by mouth every evening.      fluticasone (FLONASE) 50 MCG/ACT nasal spray Place 1 spray into both nostrils daily as needed for allergies or rhinitis.     furosemide (LASIX) 40 MG tablet TAKE 1 TABLET BY MOUTH TWICE A DAY 180 tablet 0   lisinopril (PRINIVIL,ZESTRIL) 40 MG tablet Take 40 mg by mouth in the morning.     nitroGLYCERIN (NITROSTAT) 0.4 MG SL tablet Place 1 tablet (0.4 mg total) under the tongue every 5 (five) minutes x 3 doses as needed for chest pain. 75 tablet 3   pantoprazole (PROTONIX) 40 MG tablet Take 1 tablet (40 mg total) by mouth daily before breakfast. (Patient taking differently: Take 40 mg  by mouth every evening.) 90 tablet 3   rivaroxaban (XARELTO) 20 MG TABS tablet Take 1 tablet (20 mg total) by mouth daily with supper. 14 tablet 0   No current facility-administered medications for this visit.    Physical Exam: Vitals:   03/13/21 1349  BP: (!) 112/50  Pulse: 67  SpO2: 96%  Weight: 230 lb (104.3 kg)  Height: 5\' 10"  (1.778 m)    GEN- The patient is well appearing, alert and oriented x 3 today.   Head- normocephalic, atraumatic Eyes-  Sclera clear, conjunctiva pink Ears- hearing intact Oropharynx- clear Lungs- Clear to ausculation bilaterally, normal work of breathing Heart- Regular rate and rhythm, no murmurs, rubs or gallops, PMI not laterally displaced GI- soft, NT, ND, + BS Extremities- no clubbing, cyanosis, or edema  EKG tracing ordered today is personally reviewed and shows sinus rhythm  Assessment and Plan:  1. Persistent atrial fibrillation Doing well s/p ablation chads2vasc score is 6.  Continue xarelto  2. CAD s/p CABG No ischemic symptoms No changes  3. HTN Stable No change required today  4.  OSA Compliance with CPAP advised   Return to see AF clinic in 4 months  Thompson Grayer MD, [facility] 03/13/2021 2:06 PM

## 2021-03-13 NOTE — Patient Instructions (Addendum)
Medication Instructions:  Your physician recommends that you continue on your current medications as directed. Please refer to the Current Medication list given to you today. *If you need a refill on your cardiac medications before your next appointment, please call your pharmacy*  Lab Work: None. If you have labs (blood work) drawn today and your tests are completely normal, you will receive your results only by: [place] (if you have MyChart) OR A paper copy in the mail If you have any lab test that is abnormal or we need to change your treatment, we will call you to review the results.  Testing/Procedures: None.  Follow-Up: At [facility], you and your health needs are our priority.  As part of our continuing mission to provide you with exceptional heart care, we have created designated Provider Care Teams.  These Care Teams include your primary Cardiologist (physician) and Advanced Practice Providers (APPs -  Physician Assistants and Nurse Practitioners) who all work together to provide you with the care you need, when you need it.  Your physician wants you to follow-up in: 4 months with the Afib Clinic. They will contact you to schedule.    You will receive a reminder letter in the mail two months in advance. If you don't receive a letter, please call our office to schedule the follow-up appointment.  We recommend signing up for the patient portal called "MyChart".  Sign up information is provided on this After Visit Summary.  MyChart is used to connect with patients for Virtual Visits (Telemedicine).  Patients are able to view lab/test results, encounter notes, upcoming appointments, etc.  Non-urgent messages can be sent to your provider as well.   To learn more about what you can do with MyChart, go to [email or web address].    Any Other Special Instructions Will Be Listed Below (If Applicable).

## 2021-03-15 DIAGNOSIS — I5022 Chronic systolic (congestive) heart failure: Secondary | ICD-10-CM | POA: Diagnosis not present

## 2021-03-15 DIAGNOSIS — I10 Essential (primary) hypertension: Secondary | ICD-10-CM | POA: Diagnosis not present

## 2021-03-15 DIAGNOSIS — Z23 Encounter for immunization: Secondary | ICD-10-CM | POA: Diagnosis not present

## 2021-03-15 DIAGNOSIS — N183 Chronic kidney disease, stage 3 unspecified: Secondary | ICD-10-CM | POA: Diagnosis not present

## 2021-03-16 ENCOUNTER — Other Ambulatory Visit: Payer: Self-pay | Admitting: Internal Medicine

## 2021-03-18 NOTE — Telephone Encounter (Signed)
Xarelto 20mg  refill request received. Pt is 80 years old, weight-104.3kg, Crea-1.45 on 01/14/2021, last seen by [name] on 03/13/2021, Diagnosis-Afib, [place].31ml/min; Dose is appropriate based on dosing criteria. Will send in refill to requested pharmacy.

## 2021-04-04 DIAGNOSIS — N312 Flaccid neuropathic bladder, not elsewhere classified: Secondary | ICD-10-CM | POA: Diagnosis not present

## 2021-04-04 DIAGNOSIS — N200 Calculus of kidney: Secondary | ICD-10-CM | POA: Diagnosis not present

## 2021-04-04 DIAGNOSIS — N401 Enlarged prostate with lower urinary tract symptoms: Secondary | ICD-10-CM | POA: Diagnosis not present

## 2021-04-04 DIAGNOSIS — E291 Testicular hypofunction: Secondary | ICD-10-CM | POA: Diagnosis not present

## 2021-04-04 DIAGNOSIS — N529 Male erectile dysfunction, unspecified: Secondary | ICD-10-CM | POA: Diagnosis not present

## 2021-04-06 ENCOUNTER — Other Ambulatory Visit: Payer: Self-pay

## 2021-04-06 DIAGNOSIS — I6521 Occlusion and stenosis of right carotid artery: Secondary | ICD-10-CM

## 2021-04-06 DIAGNOSIS — I6522 Occlusion and stenosis of left carotid artery: Secondary | ICD-10-CM

## 2021-04-14 ENCOUNTER — Other Ambulatory Visit: Payer: Self-pay | Admitting: Internal Medicine

## 2021-04-19 ENCOUNTER — Ambulatory Visit (INDEPENDENT_AMBULATORY_CARE_PROVIDER_SITE_OTHER): Payer: Medicare Other | Admitting: Physician Assistant

## 2021-04-19 ENCOUNTER — Ambulatory Visit (HOSPITAL_COMMUNITY)
Admission: RE | Admit: 2021-04-19 | Discharge: 2021-04-19 | Disposition: A | Discharge status: Home / Self Care | Payer: Medicare Other | Source: Ambulatory Visit | Attending: Vascular Surgery | Admitting: Vascular Surgery

## 2021-04-19 ENCOUNTER — Telehealth (HOSPITAL_COMMUNITY): Payer: Self-pay | Admitting: *Deleted

## 2021-04-19 ENCOUNTER — Other Ambulatory Visit: Payer: Self-pay

## 2021-04-19 ENCOUNTER — Encounter (HOSPITAL_COMMUNITY): Payer: Self-pay

## 2021-04-19 VITALS — BP 131/65 | HR 85 | Temp 98.0°F | Resp 20 | Ht 70.0 in | Wt 229.4 lb

## 2021-04-19 DIAGNOSIS — I6521 Occlusion and stenosis of right carotid artery: Secondary | ICD-10-CM | POA: Insufficient documentation

## 2021-04-19 DIAGNOSIS — I6522 Occlusion and stenosis of left carotid artery: Secondary | ICD-10-CM | POA: Diagnosis not present

## 2021-04-19 NOTE — Progress Notes (Signed)
Carotid Artery Follow-Up   VASCULAR SURGERY ASSESSMENT & PLAN:   Caleb Wolf is a 80 y.o. male with a history of carotid disease.  He is status post left carotid endarterectomy on February 17, 2014 by Dr. Kellie Simmering for asymptomatic stenosis.  He has known right ICA occlusion. He had a resection of a redundant segment of the left internal carotid artery with primary reanastomosis.   Dr. Kellie Simmering performed his surgery in conjunction with coronary artery bypass grafting.   Bilateral carotid artery stenosis: The patient has no symptoms referable to carotid artery stenosis.  Duplex examination today is stable as compared to 1 year ago.  We reviewed the signs and symptoms of stroke/TIA and advised the patient to call EMS should these occur.   Continue optimal medical management of hypertension and follow-up with primary care physician. Encouraged continuation of complete smoking cessation. Continue the following medications: statin; patient on rivaroxaban for hx of atrial fib Follow-up in 1 year with carotid duplex ultrasound.  SUBJECTIVE:   The patient denies monocular blindness, slurred speech, facial drooping, extremity weakness or numbness.  PHYSICAL EXAM:   Vitals:   04/19/21 1250 04/19/21 1251  BP: 121/67 131/65  Pulse: 85   Resp: 20   Temp: 98 F (36.7 C)   SpO2: 100%      General appearance: Well-developed, well-nourished in no apparent distress Neurologic: Alert and oriented x4, tongue is midline, face symmetric, speech fluent, 5 out of 5 bilateral upper extremity grip strength, triceps and biceps strength Cardiovascular: Heart rate and rhythm are regular.  Pedal pulses are palpable.  No carotid bruits. Respirations: Nonlabored Abdomen: No palpable pulsatile mass   NON-INVASIVE VASCULAR STUDIES   04/19/2021 Summary:  Right Carotid: The CCA and ICA appears occluded.   Left Carotid: Velocities in the left ICA are consistent with a 1-39%  stenosis. Patent endarterectomy  site.   Vertebrals:  Bilateral vertebral arteries demonstrate antegrade flow.  Subclavians: Normal flow hemodynamics were seen in bilateral subclavian arteries.   *See table(s) above for measurements and observations.       Preliminary     PROBLEM LIST:    The patient's past medical history, past surgical history, family history, social history, allergy list and medication list are reviewed.   CURRENT MEDS:    Current Outpatient Medications:    acetaminophen (TYLENOL) 500 MG tablet, Take 1,000 mg by mouth every 6 (six) hours as needed for moderate pain., Disp: , Rfl:    amoxicillin (AMOXIL) 500 MG capsule, Take 2,000 mg by mouth See admin instructions. Take 4 capsules (2000 mg) by mouth before dental procedures, Disp: , Rfl:    atorvastatin (LIPITOR) 80 MG tablet, TAKE 1 TABLET BY MOUTH EVERY EVENING, Disp: 90 tablet, Rfl: 3   colchicine 0.6 MG tablet, Take 0.6 mg by mouth daily as needed (for gout flare up)., Disp: , Rfl:    Cyanocobalamin (B-12) 1000 MCG SUBL, Place 1,000 mcg under the tongue in the morning., Disp: , Rfl:    cyclobenzaprine (FLEXERIL) 10 MG tablet, Take 10 mg by mouth 2 (two) times daily as needed for muscle spasms., Disp: , Rfl:    diltiazem (CARDIZEM CD) 240 MG 24 hr capsule, Take 1 capsule (240 mg total) by mouth daily., Disp: 90 capsule, Rfl: 3   diltiazem (CARDIZEM) 30 MG tablet, Take 1 tablet every 4 hours AS NEEDED for heart rate >100, Disp: 30 tablet, Rfl: 1   doxazosin (CARDURA) 4 MG tablet, Take 4 mg by mouth in the morning., Disp: ,  Rfl: 4   finasteride (PROSCAR) 5 MG tablet, Take 5 mg by mouth every evening. , Disp: , Rfl:    fluticasone (FLONASE) 50 MCG/ACT nasal spray, Place 1 spray into both nostrils daily as needed for allergies or rhinitis., Disp: , Rfl:    furosemide (LASIX) 40 MG tablet, TAKE 1 TABLET BY MOUTH TWICE A DAY, Disp: 180 tablet, Rfl: 0   lisinopril (PRINIVIL,ZESTRIL) 40 MG tablet, Take 40 mg by mouth in the morning., Disp: , Rfl:     nitroGLYCERIN (NITROSTAT) 0.4 MG SL tablet, Place 1 tablet (0.4 mg total) under the tongue every 5 (five) minutes x 3 doses as needed for chest pain., Disp: 75 tablet, Rfl: 3   pantoprazole (PROTONIX) 40 MG tablet, Take 1 tablet (40 mg total) by mouth daily before breakfast. (Patient taking differently: Take 40 mg by mouth every evening.), Disp: 90 tablet, Rfl: 3   rivaroxaban (XARELTO) 20 MG TABS tablet, TAKE 1 TABLET BY MOUTH DAILY WITH SUPPER., Disp: 90 tablet, Rfl: 1   REVIEW OF SYSTEMS:   [X]  denotes positive finding, [ ]  denotes negative finding Cardiac  Comments:  Chest pain or chest pressure:    Shortness of breath upon exertion:    Short of breath when lying flat:    Irregular heart rhythm:        Vascular    Pain in calf, thigh, or hip brought on by ambulation:    Pain in feet at night that wakes you up from your sleep:     Blood clot in your veins:    Leg swelling:         Pulmonary    Oxygen at home:    Productive cough:     Wheezing:         Neurologic    Sudden weakness in arms or legs:     Sudden numbness in arms or legs:     Sudden onset of difficulty speaking or slurred speech:    Temporary loss of vision in one eye:     Problems with dizziness:         Gastrointestinal    Blood in stool:     Vomited blood:         Genitourinary    Burning when urinating:     Blood in urine:        Psychiatric    Major depression:         Hematologic    Bleeding problems:    Problems with blood clotting too easily:        Skin    Rashes or ulcers:        Constitutional    Fever or chills:     Barbie Banner, PA-C  Office: [phone] 04/19/2021

## 2021-04-19 NOTE — Telephone Encounter (Signed)
Patient called in stating his apple watch has been notifying him he is in afib for about the last week and half intermittently. HR around 105 when it notifies. Pt is asymptomatic with these notifications. Pt will use EKG feature on watch next time he is notified of afib and send it through mychart to confirm rhythm. Pt in agreement.

## 2021-05-09 ENCOUNTER — Other Ambulatory Visit: Payer: Self-pay

## 2021-05-09 ENCOUNTER — Ambulatory Visit (HOSPITAL_COMMUNITY)
Admission: RE | Admit: 2021-05-09 | Discharge: 2021-05-09 | Disposition: A | Discharge status: Home / Self Care | Payer: Medicare Other | Source: Ambulatory Visit | Attending: Nurse Practitioner | Admitting: Nurse Practitioner

## 2021-05-09 VITALS — BP 150/64 | HR 102 | Ht 70.0 in | Wt 232.2 lb

## 2021-05-09 DIAGNOSIS — I4819 Other persistent atrial fibrillation: Secondary | ICD-10-CM | POA: Insufficient documentation

## 2021-05-09 DIAGNOSIS — I4891 Unspecified atrial fibrillation: Secondary | ICD-10-CM | POA: Diagnosis not present

## 2021-05-09 DIAGNOSIS — Z87891 Personal history of nicotine dependence: Secondary | ICD-10-CM | POA: Diagnosis not present

## 2021-05-09 DIAGNOSIS — D6869 Other thrombophilia: Secondary | ICD-10-CM | POA: Diagnosis not present

## 2021-05-09 DIAGNOSIS — Z79899 Other long term (current) drug therapy: Secondary | ICD-10-CM | POA: Insufficient documentation

## 2021-05-09 MED ORDER — METOPROLOL SUCCINATE ER 25 MG PO TB24: 25.0000 mg | ORAL_TABLET | Freq: Every day | ORAL | 3 refills | Status: DC

## 2021-05-09 NOTE — Progress Notes (Signed)
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 He had his last cardioversion in July of this year. He is in the afib clinic as he noted return of afib since the end of November. All the strips I reviewed in his phone show afib. He assumed if his HR was slower he had returned to SR but his strips do not show this to be the case. Most of the HR's are over 100 bpm. Instead of pt trying to understand when to take the extra Cardizem 30 mg daily, I will add 25 mg toprol hs for better rate control and may get lucky and flip him back to SR. Overall, other than some fatigue, he is tolerating afib well.     Today, he denies symptoms of palpitations, chest pain, shortness of breath, orthopnea, PND, lower extremity edema, dizziness, presyncope, syncope, or neurologic sequela. The patient is tolerating medications without difficulties and is otherwise without complaint today.   Past Medical History:  Diagnosis Date   Arthritis    Basal cell carcinoma    Carotid stenosis    Coronary artery disease    Multvessel s/p CABG 2015   Enlarged prostate    Essential hypertension    GERD (gastroesophageal reflux disease)    Gout    History of colon polyps    History of kidney stones    Hyperlipidemia    Lumbar disc disease    Persistent atrial fibrillation (HCC)    Sleep apnea    CPAP   Past Surgical History:  Procedure Laterality Date   ABLATION OF DYSRHYTHMIC FOCUS  07/28/2017   ATRIAL FIBRILLATION ABLATION N/A 07/28/2017   Procedure: ATRIAL FIBRILLATION ABLATION;  Surgeon: Thompson Grayer, MD;  Location: [place] [facility];  Service: Cardiovascular;  Laterality: N/A;   ATRIAL FIBRILLATION ABLATION N/A 12/04/2020   Procedure: ATRIAL FIBRILLATION ABLATION;  Surgeon: Thompson Grayer, MD;  Location: [place] [facility];  Service: Cardiovascular;  Laterality: N/A;   BACK SURGERY      CARDIAC CATHETERIZATION  05/22/2014   Procedure: IABP INSERTION;  Surgeon: Leonie Man, MD;  Location: [facility] LAB;  Service: Cardiovascular;;   CARDIOVERSION N/A 04/29/2017   Procedure: CARDIOVERSION;  Surgeon: Satira Sark, MD;  [facility];  Service: Cardiovascular;  Laterality: N/A;   CARDIOVERSION N/A 08/13/2017   Procedure: CARDIOVERSION;  Surgeon: Pixie Casino, MD;  Location: [place];  Service: Cardiovascular;  Laterality: N/A;   CARDIOVERSION N/A 08/15/2019   Procedure: CARDIOVERSION;  Surgeon: Dorothy Spark, MD;  Location: [facility] ENDOSCOPY;  Service: Cardiovascular;  Laterality: N/A;   CARDIOVERSION N/A 03/09/2020   Procedure: CARDIOVERSION;  Surgeon: Buford Dresser, MD;  Location: [name];  Service: Cardiovascular;  Laterality: N/A;   CARDIOVERSION N/A 07/06/2020   Procedure: CARDIOVERSION;  Surgeon: Sanda Klein, MD;  Location: [place];  Service: Cardiovascular;  Laterality: N/A;   CARDIOVERSION N/A 01/18/2021   Procedure: CARDIOVERSION;  Surgeon: Skeet Latch, MD;  Location: [place];  Service: Cardiovascular;  Laterality: N/A;   Cataract surgery Right    COLONOSCOPY     COLONOSCOPY N/A 12/30/2017   Procedure: COLONOSCOPY;  Surgeon: Rogene Houston, MD;  [facility];  Service: Endoscopy;  Laterality: N/A;   CORONARY ARTERY BYPASS GRAFT N/A 05/22/2014   Procedure: CORONARY ARTERY BYPASS GRAFTING (CABG) times three using left internal mammary and  right saphenous vein.;  Surgeon: Melrose Nakayama, MD;  Location: [place];  Service: Open Heart Surgery;  Laterality: N/A;   ENDARTERECTOMY Left 02/17/2014   Procedure: ENDARTERECTOMY CAROTID WITH PATCH ANGIOPLASTY;  Surgeon: Mal Misty, MD;  Location: [place];  Service: Vascular;  Laterality: Left;   ESOPHAGEAL DILATION N/A 08/22/2015   Procedure: ESOPHAGEAL DILATION;  Surgeon: Rogene Houston, MD;  [facility];  Service: Endoscopy;  Laterality: N/A;    ESOPHAGOGASTRODUODENOSCOPY N/A 08/22/2015   Procedure: ESOPHAGOGASTRODUODENOSCOPY (EGD);  Surgeon: Rogene Houston, MD;  [facility];  Service: Endoscopy;  Laterality: N/A;  12:45 - moved to 1:55 - Ann notified pt   ESOPHAGOGASTRODUODENOSCOPY N/A 12/30/2017   Procedure: ESOPHAGOGASTRODUODENOSCOPY (EGD);  Surgeon: Rogene Houston, MD;  [facility];  Service: Endoscopy;  Laterality: N/A;  200   EYE SURGERY     cataract extraction (right) with repair macular tear , with IOL     right   FRACTURE SURGERY     bilateral wrist fractures- one ORIF   JOINT REPLACEMENT  2011   left knee   LEFT HEART CATHETERIZATION WITH CORONARY ANGIOGRAM N/A 05/22/2014   Procedure: LEFT HEART CATHETERIZATION WITH CORONARY ANGIOGRAM;  Surgeon: Leonie Man, MD;  Location: [facility] CATH LAB;  Service: Cardiovascular;  Laterality: N/A;   LITHOTRIPSY     PARS PLANA VITRECTOMY W/ REPAIR OF MACULAR HOLE     RHINOPLASTY     TEE WITHOUT CARDIOVERSION N/A 04/29/2017   Procedure: TRANSESOPHAGEAL ECHOCARDIOGRAM (TEE) WITH PROPOFOL;  Surgeon: Satira Sark, MD;  [facility];  Service: Cardiovascular;  Laterality: N/A;   TONSILLECTOMY     TOTAL HIP ARTHROPLASTY  12/08/2011   Procedure: TOTAL HIP ARTHROPLASTY;  Surgeon: Gearlean Alf, MD;  [facility]: [facility];  Service: Orthopedics;  Laterality: Right;   UPPER GI ENDOSCOPY  12/18/2015   Procedure: UPPER GI ENDOSCOPY;  Surgeon: Ralene Ok, MD;  Location: [facility];  Service: General;;   WRIST SURGERY Right 28yrs ago   WRIST SURGERY Left     Current Outpatient Medications  Medication Sig Dispense Refill   acetaminophen (TYLENOL) 500 MG tablet Take 1,000 mg by mouth every 6 (six) hours as needed for moderate pain.     amoxicillin (AMOXIL) 500 MG capsule Take 2,000 mg by mouth See admin instructions. Take 4 capsules (2000 mg) by mouth before dental procedures     aspirin 325 MG tablet Take 325 mg by mouth as needed.     atorvastatin (LIPITOR) 80  MG tablet TAKE 1 TABLET BY MOUTH EVERY EVENING 90 tablet 3   colchicine 0.6 MG tablet Take 0.6 mg by mouth daily as needed (for gout flare up).     Cyanocobalamin (B-12) 1000 MCG SUBL Place 1,000 mcg under the tongue in the morning.     cyclobenzaprine (FLEXERIL) 10 MG tablet Take 10 mg by mouth 2 (two) times daily as needed for muscle spasms.     diltiazem (CARDIZEM CD) 240 MG 24 hr capsule Take 1 capsule (240 mg total) by mouth daily. 90 capsule 3   diltiazem (CARDIZEM) 30 MG tablet Take 1 tablet every 4 hours AS NEEDED for heart rate >100 30 tablet 1   doxazosin (CARDURA) 4 MG tablet Take 4 mg by mouth in the morning.  4   finasteride (PROSCAR) 5 MG tablet Take 5 mg by mouth every evening.      fluticasone (FLONASE) 50 MCG/ACT nasal spray Place 1 spray into both nostrils  daily as needed for allergies or rhinitis.     furosemide (LASIX) 40 MG tablet TAKE 1 TABLET BY MOUTH TWICE A DAY 180 tablet 0   lisinopril (PRINIVIL,ZESTRIL) 40 MG tablet Take 40 mg by mouth in the morning.     nitroGLYCERIN (NITROSTAT) 0.4 MG SL tablet Place 1 tablet (0.4 mg total) under the tongue every 5 (five) minutes x 3 doses as needed for chest pain. 75 tablet 3   pantoprazole (PROTONIX) 40 MG tablet Take 1 tablet (40 mg total) by mouth daily before breakfast. (Patient taking differently: Take 40 mg by mouth every evening.) 90 tablet 3   rivaroxaban (XARELTO) 20 MG TABS tablet TAKE 1 TABLET BY MOUTH DAILY WITH SUPPER. 90 tablet 1   No current facility-administered medications for this encounter.    Allergies  Allergen Reactions   Codeine Sulfate Nausea Only    Social History   Socioeconomic History   Marital status: Married    Spouse name: Not on file   Number of children: Not on file   Years of education: Not on file   Highest education level: Not on file  Occupational History   Occupation: retired    Comment: [employer]  Tobacco Use   Smoking status: Former    Packs/day: 1.50    Years:  25.00    Pack years: 37.50    Types: Cigarettes    Start date: 08/08/1960    Quit date: 06/02/1982    Years since quitting: 38.9   Smokeless tobacco: Never   Tobacco comments:    quit smoking 30+yrs ago  Vaping Use   Vaping Use: Never used  Substance and Sexual Activity   Alcohol use: No    Alcohol/week: 0.0 standard drinks    Comment: occasionally    Drug use: No   Sexual activity: Yes  Other Topics Concern   Not on file  Social History Narrative   Not on file   Social Determinants of Health   Financial Resource Strain: Not on file  Food Insecurity: Not on file  Transportation Needs: Not on file  Physical Activity: Not on file  Stress: Not on file  Social Connections: Not on file  Intimate Partner Violence: Not on file    Family History  Problem Relation Age of Onset   Allergies Mother    Heart disease Mother    Hypertension Mother    Heart disease Father        MI    ROS- All systems are reviewed and negative except as per the HPI above  Physical Exam: Vitals:   05/09/21 1447  Height: 5\' 10"  (1.778 m)   Wt Readings from Last 3 Encounters:  04/19/21 104.1 kg  03/13/21 104.3 kg  01/24/21 107 kg    Labs: Lab Results  Component Value Date   NA 138 01/14/2021   K 4.2 01/14/2021   CL 105 01/14/2021   CO2 25 01/14/2021   GLUCOSE 109 (H) 01/14/2021   BUN 23 01/14/2021   CREATININE 1.45 (H) 01/14/2021   CALCIUM 8.9 01/14/2021   PHOS 3.3 04/29/2017   MG 1.9 04/30/2017   Lab Results  Component Value Date   INR 2.2 (H) 08/15/2019   Lab Results  Component Value Date   CHOL 134 03/22/2015   HDL 41 03/22/2015   [place] 75 03/22/2015   TRIG 92 03/22/2015     GEN- The patient is well appearing, alert and oriented x 3 today.   Head- normocephalic, atraumatic Eyes-  Sclera clear, conjunctiva pink Ears- hearing intact Oropharynx- clear Neck- supple, no JVP Lymph- no cervical lymphadenopathy Lungs- Clear to ausculation bilaterally, normal work of  breathing Heart- irregular rate and rhythm, no murmurs, rubs or gallops, PMI not laterally displaced GI- soft, NT, ND, + BS Extremities- no clubbing, cyanosis, or edema MS- no significant deformity or atrophy Skin- no rash or lesion Psych- euthymic mood, full affect Neuro- strength and sensation are intact  EKG- afib at 102 bpm, qrs int 88 ms, qtc 427 ms     Assessment and Plan: 1. Afib   Ablation x 2 with last  one in July 2022 Successful cardioversion in August of this year  Now back in Afib since last of November  Will add 25 mg toprol ER at HS  Continue 240 mg cardizem daily, and 30 mg cardizem as needed     2. CHA2DS2VASc score of 6 Continue  xarelto 20 mg daily  No missed doses   I will see back next week for further evaluation He may need antiarrythmic's with increase in afib burden    Butch Penny C. Roshan Salamon, [facility] [address] [place], [place] [address] [phone]

## 2021-05-09 NOTE — Patient Instructions (Signed)
Start metoprolol 25mg once a day at bedtime 

## 2021-05-14 ENCOUNTER — Other Ambulatory Visit (HOSPITAL_COMMUNITY): Payer: Self-pay

## 2021-05-14 MED ORDER — RIVAROXABAN 20 MG PO TABS: 20.0000 mg | ORAL_TABLET | Freq: Every day | ORAL | 0 refills | Status: DC

## 2021-05-15 ENCOUNTER — Other Ambulatory Visit: Payer: Self-pay

## 2021-05-15 ENCOUNTER — Encounter (HOSPITAL_COMMUNITY): Payer: Self-pay | Admitting: Nurse Practitioner

## 2021-05-15 ENCOUNTER — Ambulatory Visit (HOSPITAL_COMMUNITY)
Admission: RE | Admit: 2021-05-15 | Discharge: 2021-05-15 | Disposition: A | Discharge status: Home / Self Care | Payer: Medicare Other | Source: Ambulatory Visit | Attending: Nurse Practitioner | Admitting: Nurse Practitioner

## 2021-05-15 DIAGNOSIS — D6869 Other thrombophilia: Secondary | ICD-10-CM | POA: Diagnosis not present

## 2021-05-15 DIAGNOSIS — Z79899 Other long term (current) drug therapy: Secondary | ICD-10-CM | POA: Insufficient documentation

## 2021-05-15 DIAGNOSIS — Z7901 Long term (current) use of anticoagulants: Secondary | ICD-10-CM | POA: Diagnosis not present

## 2021-05-15 DIAGNOSIS — I4891 Unspecified atrial fibrillation: Secondary | ICD-10-CM | POA: Insufficient documentation

## 2021-05-15 LAB — COMPREHENSIVE METABOLIC PANEL
ALT: 22 U/L (ref 0–44)
AST: 14 U/L — ABNORMAL LOW (ref 15–41)
Albumin: 3.8 g/dL (ref 3.5–5.0)
Alkaline Phosphatase: 57 U/L (ref 38–126)
Anion gap: 8 (ref 5–15)
BUN: 31 mg/dL — ABNORMAL HIGH (ref 8–23)
CO2: 25 mmol/L (ref 22–32)
Calcium: 8.9 mg/dL (ref 8.9–10.3)
Chloride: 107 mmol/L (ref 98–111)
Creatinine, Ser: 1.88 mg/dL — ABNORMAL HIGH (ref 0.61–1.24)
GFR, Estimated: 36 mL/min — ABNORMAL LOW (ref >60)
Glucose, Bld: 88 mg/dL (ref 70–99)
Potassium: 4.4 mmol/L (ref 3.5–5.1)
Sodium: 140 mmol/L (ref 135–145)
Total Bilirubin: 0.6 mg/dL (ref 0.3–1.2)
Total Protein: 6.2 g/dL — ABNORMAL LOW (ref 6.5–8.1)

## 2021-05-15 LAB — TSH: TSH: 2.087 u[IU]/mL (ref 0.350–4.500)

## 2021-05-15 MED ORDER — AMIODARONE HCL 200 MG PO TABS: ORAL_TABLET | ORAL | 0 refills | Status: DC

## 2021-05-15 NOTE — Progress Notes (Signed)
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 He had his last cardioversion in July of this year. He is in the afib clinic as he noted return of afib since the end of November. All the strips I reviewed in his phone show afib. He assumed if his HR was slower he had returned to SR but his strips do not show this to be the case. Most of the HR's are over 100 bpm. Instead of pt trying to understand when to take the extra Cardizem 30 mg daily, I will add 25 mg toprol hs for better rate control and may get lucky and flip him back to SR. Overall, other than some fatigue, he is tolerating afib well.    Return to [facility] [place] clinic, 05/15/21. He remains in SR, but slower with addition of BB. Discussion re restoring SR. Amiodarone, Tikosyn and  repeat cardioversion alone  was dicussed. He had been on amiodarone at one time and it was stopped after his first ablation as he was staying in SR and brady was noted.  After lengthy discussion, he and his wife decided on amiodarone.    Today, he denies symptoms of palpitations, chest pain, shortness of breath, orthopnea, PND, lower extremity edema, dizziness, presyncope, syncope, or neurologic sequela. The patient is tolerating medications without difficulties and is otherwise without complaint today.   Past Medical History:  Diagnosis Date   Arthritis    Basal cell carcinoma    Carotid stenosis    Coronary artery disease    Multvessel s/p CABG 2015   Enlarged prostate    Essential hypertension    GERD (gastroesophageal reflux disease)    Gout    History of colon polyps    History of kidney stones    Hyperlipidemia    Lumbar disc disease    Persistent atrial fibrillation (HCC)    Sleep apnea    CPAP   Past Surgical History:  Procedure Laterality Date   ABLATION OF DYSRHYTHMIC FOCUS  07/28/2017   ATRIAL FIBRILLATION ABLATION  N/A 07/28/2017   Procedure: ATRIAL FIBRILLATION ABLATION;  Surgeon: Thompson Grayer, MD;  Location: [place] [facility];  Service: Cardiovascular;  Laterality: N/A;   ATRIAL FIBRILLATION ABLATION N/A 12/04/2020   Procedure: ATRIAL FIBRILLATION ABLATION;  Surgeon: Thompson Grayer, MD;  Location: [place] [facility];  Service: Cardiovascular;  Laterality: N/A;   BACK SURGERY     CARDIAC CATHETERIZATION  05/22/2014   Procedure: IABP INSERTION;  Surgeon: Leonie Man, MD;  Location: [facility] CATH LAB;  Service: Cardiovascular;;   CARDIOVERSION N/A 04/29/2017   Procedure: CARDIOVERSION;  Surgeon: Satira Sark, MD;  [facility];  Service: Cardiovascular;  Laterality: N/A;   CARDIOVERSION N/A 08/13/2017   Procedure: CARDIOVERSION;  Surgeon: Pixie Casino, MD;  Location: [facility] ENDOSCOPY;  Service: Cardiovascular;  Laterality: N/A;   CARDIOVERSION N/A 08/15/2019   Procedure: CARDIOVERSION;  Surgeon: Dorothy Spark, MD;  Location: [facility] ENDOSCOPY;  Service: Cardiovascular;  Laterality: N/A;   CARDIOVERSION N/A 03/09/2020   Procedure: CARDIOVERSION;  Surgeon: Buford Dresser, MD;  Location: [place];  Service: Cardiovascular;  Laterality: N/A;   CARDIOVERSION N/A 07/06/2020   Procedure: CARDIOVERSION;  Surgeon: Sanda Klein, MD;  Location: [place];  Service: Cardiovascular;  Laterality: N/A;   CARDIOVERSION N/A 01/18/2021   Procedure: CARDIOVERSION;  Surgeon: Skeet Latch, MD;  Location:  [facility] ENDOSCOPY;  Service: Cardiovascular;  Laterality: N/A;   Cataract surgery Right    COLONOSCOPY     COLONOSCOPY N/A 12/30/2017   Procedure: COLONOSCOPY;  Surgeon: Rogene Houston, MD;  [facility];  Service: Endoscopy;  Laterality: N/A;   CORONARY ARTERY BYPASS GRAFT N/A 05/22/2014   Procedure: CORONARY ARTERY BYPASS GRAFTING (CABG) times three using left internal mammary and right saphenous vein.;  Surgeon: Melrose Nakayama, MD;  Location: [place];  Service: Open Heart Surgery;   Laterality: N/A;   ENDARTERECTOMY Left 02/17/2014   Procedure: ENDARTERECTOMY CAROTID WITH PATCH ANGIOPLASTY;  Surgeon: Mal Misty, MD;  Location: [place];  Service: Vascular;  Laterality: Left;   ESOPHAGEAL DILATION N/A 08/22/2015   Procedure: ESOPHAGEAL DILATION;  Surgeon: Rogene Houston, MD;  [facility];  Service: Endoscopy;  Laterality: N/A;   ESOPHAGOGASTRODUODENOSCOPY N/A 08/22/2015   Procedure: ESOPHAGOGASTRODUODENOSCOPY (EGD);  Surgeon: Rogene Houston, MD;  [facility];  Service: Endoscopy;  Laterality: N/A;  12:45 - moved to 1:55 - Ann notified pt   ESOPHAGOGASTRODUODENOSCOPY N/A 12/30/2017   Procedure: ESOPHAGOGASTRODUODENOSCOPY (EGD);  Surgeon: Rogene Houston, MD;  [facility];  Service: Endoscopy;  Laterality: N/A;  200   EYE SURGERY     cataract extraction (right) with repair macular tear , with IOL     right   FRACTURE SURGERY     bilateral wrist fractures- one ORIF   JOINT REPLACEMENT  2011   left knee   LEFT HEART CATHETERIZATION WITH CORONARY ANGIOGRAM N/A 05/22/2014   Procedure: LEFT HEART CATHETERIZATION WITH CORONARY ANGIOGRAM;  Surgeon: Leonie Man, MD;  Location: [facility] CATH LAB;  Service: Cardiovascular;  Laterality: N/A;   LITHOTRIPSY     PARS PLANA VITRECTOMY W/ REPAIR OF MACULAR HOLE     RHINOPLASTY     TEE WITHOUT CARDIOVERSION N/A 04/29/2017   Procedure: TRANSESOPHAGEAL ECHOCARDIOGRAM (TEE) WITH PROPOFOL;  Surgeon: Satira Sark, MD;  [facility];  Service: Cardiovascular;  Laterality: N/A;   TONSILLECTOMY     TOTAL HIP ARTHROPLASTY  12/08/2011   Procedure: TOTAL HIP ARTHROPLASTY;  Surgeon: Gearlean Alf, MD;  [facility];  Service: Orthopedics;  Laterality: Right;   UPPER GI ENDOSCOPY  12/18/2015   Procedure: UPPER GI ENDOSCOPY;  Surgeon: Ralene Ok, MD;  [facility];  Service: General;;   WRIST SURGERY Right 15yrs ago   WRIST SURGERY Left     Current Outpatient Medications  Medication  Sig Dispense Refill   amiodarone (PACERONE) 200 MG tablet Take 1 tablet by mouth twice a day for 1 month then reduce to 1 tablet daily 60 tablet 0   acetaminophen (TYLENOL) 500 MG tablet Take 1,000 mg by mouth every 6 (six) hours as needed for moderate pain.     amoxicillin (AMOXIL) 500 MG capsule Take 2,000 mg by mouth See admin instructions. Take 4 capsules (2000 mg) by mouth before dental procedures     aspirin 325 MG tablet Take 325 mg by mouth as needed.     atorvastatin (LIPITOR) 80 MG tablet TAKE 1 TABLET BY MOUTH EVERY EVENING 90 tablet 3   colchicine 0.6 MG tablet Take 0.6 mg by mouth daily as needed (for gout flare up).     Cyanocobalamin (B-12) 1000 MCG SUBL Place 1,000 mcg under the tongue in the morning.     cyclobenzaprine (FLEXERIL) 10 MG tablet Take 10 mg by mouth 2 (two) times daily as needed for muscle  spasms.     diltiazem (CARDIZEM CD) 240 MG 24 hr capsule Take 1 capsule (240 mg total) by mouth daily. 90 capsule 3   diltiazem (CARDIZEM) 30 MG tablet Take 1 tablet every 4 hours AS NEEDED for heart rate >100 30 tablet 1   doxazosin (CARDURA) 4 MG tablet Take 4 mg by mouth in the morning.  4   finasteride (PROSCAR) 5 MG tablet Take 5 mg by mouth every evening.      fluticasone (FLONASE) 50 MCG/ACT nasal spray Place 1 spray into both nostrils daily as needed for allergies or rhinitis.     furosemide (LASIX) 40 MG tablet TAKE 1 TABLET BY MOUTH TWICE A DAY 180 tablet 0   lisinopril (PRINIVIL,ZESTRIL) 40 MG tablet Take 40 mg by mouth in the morning.     metoprolol succinate (TOPROL XL) 25 MG 24 hr tablet Take 1 tablet (25 mg total) by mouth at bedtime. 30 tablet 3   nitroGLYCERIN (NITROSTAT) 0.4 MG SL tablet Place 1 tablet (0.4 mg total) under the tongue every 5 (five) minutes x 3 doses as needed for chest pain. 75 tablet 3   pantoprazole (PROTONIX) 40 MG tablet Take 1 tablet (40 mg total) by mouth daily before breakfast. (Patient taking differently: Take 40 mg by mouth every evening.)  90 tablet 3   rivaroxaban (XARELTO) 20 MG TABS tablet Take 1 tablet (20 mg total) by mouth daily with supper. 28 tablet 0   No current facility-administered medications for this encounter.    Allergies  Allergen Reactions   Codeine Sulfate Nausea Only    Social History   Socioeconomic History   Marital status: Married    Spouse name: Not on file   Number of children: Not on file   Years of education: Not on file   Highest education level: Not on file  Occupational History   Occupation: retired    Comment: [employer]  Tobacco Use   Smoking status: Former    Packs/day: 1.50    Years: 25.00    Pack years: 37.50    Types: Cigarettes    Start date: 08/08/1960    Quit date: 06/02/1982    Years since quitting: 38.9   Smokeless tobacco: Never   Tobacco comments:    quit smoking 30+yrs ago  Vaping Use   Vaping Use: Never used  Substance and Sexual Activity   Alcohol use: No    Alcohol/week: 0.0 standard drinks    Comment: occasionally    Drug use: No   Sexual activity: Yes  Other Topics Concern   Not on file  Social History Narrative   Not on file   Social Determinants of Health   Financial Resource Strain: Not on file  Food Insecurity: Not on file  Transportation Needs: Not on file  Physical Activity: Not on file  Stress: Not on file  Social Connections: Not on file  Intimate Partner Violence: Not on file    Family History  Problem Relation Age of Onset   Allergies Mother    Heart disease Mother    Hypertension Mother    Heart disease Father        MI    ROS- All systems are reviewed and negative except as per the HPI above  Physical Exam: There were no vitals filed for this visit.  Wt Readings from Last 3 Encounters:  05/09/21 105.3 kg  04/19/21 104.1 kg  03/13/21 104.3 kg    Labs: Lab Results  Component Value  Date   NA 138 01/14/2021   K 4.2 01/14/2021   CL 105 01/14/2021   CO2 25 01/14/2021   GLUCOSE 109 (H) 01/14/2021   BUN  23 01/14/2021   CREATININE 1.45 (H) 01/14/2021   CALCIUM 8.9 01/14/2021   PHOS 3.3 04/29/2017   MG 1.9 04/30/2017   Lab Results  Component Value Date   INR 2.2 (H) 08/15/2019   Lab Results  Component Value Date   CHOL 134 03/22/2015   HDL 41 03/22/2015   LDLCALC 75 03/22/2015   TRIG 92 03/22/2015     GEN- The patient is well appearing, alert and oriented x 3 today.   Head- normocephalic, atraumatic Eyes-  Sclera clear, conjunctiva pink Ears- hearing intact Oropharynx- clear Neck- supple, no JVP Lymph- no cervical lymphadenopathy Lungs- Clear to ausculation bilaterally, normal work of breathing Heart- irregular rate and rhythm, no murmurs, rubs or gallops, PMI not laterally displaced GI- soft, NT, ND, + BS Extremities- no clubbing, cyanosis, or edema MS- no significant deformity or atrophy Skin- no rash or lesion Psych- euthymic mood, full affect Neuro- strength and sensation are intact  EKG- afib at 88 bpm, qrs int 92 ms, qtc 447 ms     Assessment and Plan: 1. Afib   Ablation x 2 with last  one in July 2022 Successful cardioversion in August of this year  Now back in Afib since last of November  Adding metoprolol slowed the v rate but he remains in afib Continue 240 mg cardizem daily, and 30 mg cardizem as needed    Will stop the BB and add amiodarone 200 mg bid Risk vrs benefit of drug was discussed  Cmet/tsh for  baseline amio start  2. CHA2DS2VASc score of 6 Continue  xarelto 20 mg daily  No missed doses    F/u one week after amio start    Butch Penny C. Gillie Crisci, [facility] [address] [place], [place] [address] [phone]

## 2021-05-15 NOTE — Patient Instructions (Signed)
Stop metoprolol  Start Amiodarone 200mg  twice a day for the next month

## 2021-05-16 ENCOUNTER — Other Ambulatory Visit: Payer: Self-pay | Admitting: Nurse Practitioner

## 2021-05-22 ENCOUNTER — Other Ambulatory Visit: Payer: Self-pay

## 2021-05-22 ENCOUNTER — Other Ambulatory Visit (HOSPITAL_COMMUNITY): Payer: Self-pay | Admitting: *Deleted

## 2021-05-22 ENCOUNTER — Ambulatory Visit (HOSPITAL_COMMUNITY)
Admission: RE | Admit: 2021-05-22 | Discharge: 2021-05-22 | Disposition: A | Discharge status: Home / Self Care | Payer: Medicare Other | Source: Ambulatory Visit | Attending: Nurse Practitioner | Admitting: Nurse Practitioner

## 2021-05-22 ENCOUNTER — Encounter (HOSPITAL_COMMUNITY): Payer: Self-pay | Admitting: Nurse Practitioner

## 2021-05-22 VITALS — HR 101

## 2021-05-22 DIAGNOSIS — I4819 Other persistent atrial fibrillation: Secondary | ICD-10-CM | POA: Insufficient documentation

## 2021-05-22 DIAGNOSIS — Z87891 Personal history of nicotine dependence: Secondary | ICD-10-CM | POA: Diagnosis not present

## 2021-05-22 DIAGNOSIS — D6869 Other thrombophilia: Secondary | ICD-10-CM

## 2021-05-22 DIAGNOSIS — I4891 Unspecified atrial fibrillation: Secondary | ICD-10-CM

## 2021-05-22 NOTE — H&P (View-Only) (Signed)
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 He had his last cardioversion in July of this year. He is in the afib clinic as he noted return of afib since the end of November. All the strips I reviewed in his phone show afib. He assumed if his HR was slower he had returned to SR but his strips do not show this to be the case. Most of the HR's are over 100 bpm. Instead of pt trying to understand when to take the extra Cardizem 30 mg daily, I will add 25 mg toprol hs for better rate control and may get lucky and flip him back to SR. Overall, other than some fatigue, he is tolerating afib well.    Return to [place] clinic, 05/15/21. He remains in afib , but slower with addition of BB. Discussion re restoring SR. Amiodarone, Tikosyn and  repeat cardioversion alone  was dicussed. He had been on amiodarone at one time and it was stopped after his first ablation as he was staying in [place] and brady was noted.  After lengthy discussion, he and his wife decided on amiodarone.   Return to [place] clinic, 05/22/21. He has been on amiodarone 200 mg bid for over a week. He is tolerating well. Remains in afib, rate controlled.  Will plan on cardioversion mid January. No missed anticoagulation.   Today, he denies symptoms of palpitations, chest pain, shortness of breath, orthopnea, PND, lower extremity edema, dizziness, presyncope, syncope, or neurologic sequela. The patient is tolerating medications without difficulties and is otherwise without complaint today.   Past Medical History:  Diagnosis Date   Arthritis    Basal cell carcinoma    Carotid stenosis    Coronary artery disease    Multvessel s/p CABG 2015   Enlarged prostate    Essential hypertension    GERD (gastroesophageal reflux disease)    Gout    History of colon polyps    History of kidney stones    Hyperlipidemia     Lumbar disc disease    Persistent atrial fibrillation (HCC)    Sleep apnea    CPAP   Past Surgical History:  Procedure Laterality Date   ABLATION OF DYSRHYTHMIC FOCUS  07/28/2017   ATRIAL FIBRILLATION ABLATION N/A 07/28/2017   Procedure: ATRIAL FIBRILLATION ABLATION;  Surgeon: Thompson Grayer, MD;  Location: [place] [facility];  Service: Cardiovascular;  Laterality: N/A;   ATRIAL FIBRILLATION ABLATION N/A 12/04/2020   Procedure: ATRIAL FIBRILLATION ABLATION;  Surgeon: Thompson Grayer, MD;  Location: [place] [facility];  Service: Cardiovascular;  Laterality: N/A;   BACK SURGERY     CARDIAC CATHETERIZATION  05/22/2014   Procedure: IABP INSERTION;  Surgeon: Leonie Man, MD;  Location: [facility] CATH LAB;  Service: Cardiovascular;;   CARDIOVERSION N/A 04/29/2017   Procedure: CARDIOVERSION;  Surgeon: Satira Sark, MD;  [facility];  Service: Cardiovascular;  Laterality: N/A;   CARDIOVERSION N/A 08/13/2017   Procedure: CARDIOVERSION;  Surgeon: Pixie Casino, MD;  Location: [facility] ENDOSCOPY;  Service: Cardiovascular;  Laterality: N/A;   CARDIOVERSION N/A 08/15/2019   Procedure: CARDIOVERSION;  Surgeon: Dorothy Spark, MD;  Location: [facility] ENDOSCOPY;  Service: Cardiovascular;  Laterality: N/A;   CARDIOVERSION N/A 03/09/2020   Procedure: CARDIOVERSION;  Surgeon: Buford Dresser, MD;  Location: [place];  Service: Cardiovascular;  Laterality: N/A;  CARDIOVERSION N/A 07/06/2020   Procedure: CARDIOVERSION;  Surgeon: Sanda Klein, MD;  Location: [facility] ENDOSCOPY;  Service: Cardiovascular;  Laterality: N/A;   CARDIOVERSION N/A 01/18/2021   Procedure: CARDIOVERSION;  Surgeon: Skeet Latch, MD;  Location: [place];  Service: Cardiovascular;  Laterality: N/A;   Cataract surgery Right    COLONOSCOPY     COLONOSCOPY N/A 12/30/2017   Procedure: COLONOSCOPY;  Surgeon: Rogene Houston, MD;  [facility];  Service: Endoscopy;  Laterality: N/A;   CORONARY ARTERY BYPASS GRAFT  N/A 05/22/2014   Procedure: CORONARY ARTERY BYPASS GRAFTING (CABG) times three using left internal mammary and right saphenous vein.;  Surgeon: Melrose Nakayama, MD;  Location: [place];  Service: Open Heart Surgery;  Laterality: N/A;   ENDARTERECTOMY Left 02/17/2014   Procedure: ENDARTERECTOMY CAROTID WITH PATCH ANGIOPLASTY;  Surgeon: Mal Misty, MD;  Location: [place];  Service: Vascular;  Laterality: Left;   ESOPHAGEAL DILATION N/A 08/22/2015   Procedure: ESOPHAGEAL DILATION;  Surgeon: Rogene Houston, MD;  [facility];  Service: Endoscopy;  Laterality: N/A;   ESOPHAGOGASTRODUODENOSCOPY N/A 08/22/2015   Procedure: ESOPHAGOGASTRODUODENOSCOPY (EGD);  Surgeon: Rogene Houston, MD;  [facility];  Service: Endoscopy;  Laterality: N/A;  12:45 - moved to 1:55 - Ann notified pt   ESOPHAGOGASTRODUODENOSCOPY N/A 12/30/2017   Procedure: ESOPHAGOGASTRODUODENOSCOPY (EGD);  Surgeon: Rogene Houston, MD;  [facility];  Service: Endoscopy;  Laterality: N/A;  200   EYE SURGERY     cataract extraction (right) with repair macular tear , with IOL     right   FRACTURE SURGERY     bilateral wrist fractures- one ORIF   JOINT REPLACEMENT  2011   left knee   LEFT HEART CATHETERIZATION WITH CORONARY ANGIOGRAM N/A 05/22/2014   Procedure: LEFT HEART CATHETERIZATION WITH CORONARY ANGIOGRAM;  Surgeon: Leonie Man, MD;  Location: [facility] CATH LAB;  Service: Cardiovascular;  Laterality: N/A;   LITHOTRIPSY     PARS PLANA VITRECTOMY W/ REPAIR OF MACULAR HOLE     RHINOPLASTY     TEE WITHOUT CARDIOVERSION N/A 04/29/2017   Procedure: TRANSESOPHAGEAL ECHOCARDIOGRAM (TEE) WITH PROPOFOL;  Surgeon: Satira Sark, MD;  [facility];  Service: Cardiovascular;  Laterality: N/A;   TONSILLECTOMY     TOTAL HIP ARTHROPLASTY  12/08/2011   Procedure: TOTAL HIP ARTHROPLASTY;  Surgeon: Gearlean Alf, MD;  Location: [facility];  Service: Orthopedics;  Laterality: Right;   UPPER GI ENDOSCOPY   12/18/2015   Procedure: UPPER GI ENDOSCOPY;  Surgeon: Ralene Ok, MD;  Location: [facility];  Service: General;;   WRIST SURGERY Right 42yrs ago   WRIST SURGERY Left     Current Outpatient Medications  Medication Sig Dispense Refill   acetaminophen (TYLENOL) 500 MG tablet Take 1,000 mg by mouth every 6 (six) hours as needed for moderate pain.     amiodarone (PACERONE) 200 MG tablet Take 1 tablet by mouth twice a day for 1 month then reduce to 1 tablet daily 60 tablet 0   amoxicillin (AMOXIL) 500 MG capsule Take 2,000 mg by mouth See admin instructions. Take 4 capsules (2000 mg) by mouth before dental procedures     aspirin 325 MG tablet Take 325 mg by mouth as needed.     atorvastatin (LIPITOR) 80 MG tablet TAKE 1 TABLET BY MOUTH EVERY EVENING 90 tablet 3   colchicine 0.6 MG tablet Take 0.6 mg by mouth daily as needed (for gout flare up).  Cyanocobalamin (B-12) 1000 MCG SUBL Place 1,000 mcg under the tongue in the morning.     cyclobenzaprine (FLEXERIL) 10 MG tablet Take 10 mg by mouth 2 (two) times daily as needed for muscle spasms.     diltiazem (CARDIZEM CD) 240 MG 24 hr capsule Take 1 capsule (240 mg total) by mouth daily. 90 capsule 3   diltiazem (CARDIZEM) 30 MG tablet Take 1 tablet every 4 hours AS NEEDED for heart rate >100 30 tablet 1   doxazosin (CARDURA) 4 MG tablet Take 4 mg by mouth in the morning.  4   finasteride (PROSCAR) 5 MG tablet Take 5 mg by mouth every evening.      fluticasone (FLONASE) 50 MCG/ACT nasal spray Place 1 spray into both nostrils daily as needed for allergies or rhinitis.     furosemide (LASIX) 40 MG tablet TAKE 1 TABLET BY MOUTH TWICE A DAY 180 tablet 0   lisinopril (PRINIVIL,ZESTRIL) 40 MG tablet Take 40 mg by mouth in the morning.     metoprolol succinate (TOPROL XL) 25 MG 24 hr tablet Take 1 tablet (25 mg total) by mouth at bedtime. 30 tablet 3   nitroGLYCERIN (NITROSTAT) 0.4 MG SL tablet Place 1 tablet (0.4 mg total) under the tongue every 5 (five)  minutes x 3 doses as needed for chest pain. 75 tablet 3   pantoprazole (PROTONIX) 40 MG tablet Take 1 tablet (40 mg total) by mouth daily before breakfast. 90 tablet 3   rivaroxaban (XARELTO) 20 MG TABS tablet Take 1 tablet (20 mg total) by mouth daily with supper. 28 tablet 0   No current facility-administered medications for this encounter.    Allergies  Allergen Reactions   Codeine Sulfate Nausea Only    Social History   Socioeconomic History   Marital status: Married    Spouse name: Not on file   Number of children: Not on file   Years of education: Not on file   Highest education level: Not on file  Occupational History   Occupation: retired    Comment: [employer]  Tobacco Use   Smoking status: Former    Packs/day: 1.50    Years: 25.00    Pack years: 37.50    Types: Cigarettes    Start date: 08/08/1960    Quit date: 06/02/1982    Years since quitting: 38.9   Smokeless tobacco: Never   Tobacco comments:    quit smoking 30+yrs ago  Vaping Use   Vaping Use: Never used  Substance and Sexual Activity   Alcohol use: No    Alcohol/week: 0.0 standard drinks    Comment: occasionally    Drug use: No   Sexual activity: Yes  Other Topics Concern   Not on file  Social History Narrative   Not on file   Social Determinants of Health   Financial Resource Strain: Not on file  Food Insecurity: Not on file  Transportation Needs: Not on file  Physical Activity: Not on file  Stress: Not on file  Social Connections: Not on file  Intimate Partner Violence: Not on file    Family History  Problem Relation Age of Onset   Allergies Mother    Heart disease Mother    Hypertension Mother    Heart disease Father        MI    ROS- All systems are reviewed and negative except as per the HPI above  Physical Exam: Vitals:   05/22/21 1124  Pulse: (!) 101  Wt Readings from Last 3 Encounters:  05/09/21 105.3 kg  04/19/21 104.1 kg  03/13/21 104.3 kg     Labs: Lab Results  Component Value Date   NA 140 05/15/2021   K 4.4 05/15/2021   CL 107 05/15/2021   CO2 25 05/15/2021   GLUCOSE 88 05/15/2021   BUN 31 (H) 05/15/2021   CREATININE 1.88 (H) 05/15/2021   CALCIUM 8.9 05/15/2021   PHOS 3.3 04/29/2017   MG 1.9 04/30/2017   Lab Results  Component Value Date   INR 2.2 (H) 08/15/2019   Lab Results  Component Value Date   CHOL 134 03/22/2015   HDL 41 03/22/2015   LDLCALC 75 03/22/2015   TRIG 92 03/22/2015     GEN- The patient is well appearing, alert and oriented x 3 today.   Head- normocephalic, atraumatic Eyes-  Sclera clear, conjunctiva pink Ears- hearing intact Oropharynx- clear Neck- supple, no JVP Lymph- no cervical lymphadenopathy Lungs- Clear to ausculation bilaterally, normal work of breathing Heart- irregular rate and rhythm, no murmurs, rubs or gallops, PMI not laterally displaced GI- soft, NT, ND, + BS Extremities- no clubbing, cyanosis, or edema MS- no significant deformity or atrophy Skin- no rash or lesion Psych- euthymic mood, full affect Neuro- strength and sensation are intact  EKG- afib at 101 bpm, qrs int 92 ms, qtc 451 ms     Assessment and Plan: 1. Afib   Ablation x 2 with last  one in July 2022 Successful cardioversion in August of this year  Now back in Afib since last of November  Adding metoprolol slowed the v rate but he remains in afib Continue 240 mg cardizem daily, and 30 mg cardizem as needed    Stopped BB and added amiodarone 200 mg bid( was on previously prior to first ablation and stopped as he was staying in [place])  Will plan on cardioversion  January 16th 2023 Risk vrs benefit of procedure discussed  Bmet/cbc am of cardioversion with repeat EKG   2. CHA2DS2VASc score of 6 Continue  xarelto 20 mg daily  No missed doses for at least 3 weeks   F/u one week after cardioversion    Butch Penny C. Kialee Kham, [facility] [address] [place], [place]  [address] [phone]

## 2021-05-22 NOTE — Progress Notes (Signed)
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 He had his last cardioversion in July of this year. He is in the afib clinic as he noted return of afib since the end of November. All the strips I reviewed in his phone show afib. He assumed if his HR was slower he had returned to SR but his strips do not show this to be the case. Most of the HR's are over 100 bpm. Instead of pt trying to understand when to take the extra Cardizem 30 mg daily, I will add 25 mg toprol hs for better rate control and may get lucky and flip him back to SR. Overall, other than some fatigue, he is tolerating afib well.    Return to [place] clinic, 05/15/21. He remains in afib , but slower with addition of BB. Discussion re restoring SR. Amiodarone, Tikosyn and  repeat cardioversion alone  was dicussed. He had been on amiodarone at one time and it was stopped after his first ablation as he was staying in [place] and brady was noted.  After lengthy discussion, he and his wife decided on amiodarone.   Return to [place] clinic, 05/22/21. He has been on amiodarone 200 mg bid for over a week. He is tolerating well. Remains in afib, rate controlled.  Will plan on cardioversion mid January. No missed anticoagulation.   Today, he denies symptoms of palpitations, chest pain, shortness of breath, orthopnea, PND, lower extremity edema, dizziness, presyncope, syncope, or neurologic sequela. The patient is tolerating medications without difficulties and is otherwise without complaint today.   Past Medical History:  Diagnosis Date   Arthritis    Basal cell carcinoma    Carotid stenosis    Coronary artery disease    Multvessel s/p CABG 2015   Enlarged prostate    Essential hypertension    GERD (gastroesophageal reflux disease)    Gout    History of colon polyps    History of kidney stones    Hyperlipidemia     Lumbar disc disease    Persistent atrial fibrillation (HCC)    Sleep apnea    CPAP   Past Surgical History:  Procedure Laterality Date   ABLATION OF DYSRHYTHMIC FOCUS  07/28/2017   ATRIAL FIBRILLATION ABLATION N/A 07/28/2017   Procedure: ATRIAL FIBRILLATION ABLATION;  Surgeon: Thompson Grayer, MD;  Location: [place] [facility];  Service: Cardiovascular;  Laterality: N/A;   ATRIAL FIBRILLATION ABLATION N/A 12/04/2020   Procedure: ATRIAL FIBRILLATION ABLATION;  Surgeon: Thompson Grayer, MD;  Location: [place] [facility];  Service: Cardiovascular;  Laterality: N/A;   BACK SURGERY     CARDIAC CATHETERIZATION  05/22/2014   Procedure: IABP INSERTION;  Surgeon: Leonie Man, MD;  Location: [facility] CATH LAB;  Service: Cardiovascular;;   CARDIOVERSION N/A 04/29/2017   Procedure: CARDIOVERSION;  Surgeon: Satira Sark, MD;  [facility];  Service: Cardiovascular;  Laterality: N/A;   CARDIOVERSION N/A 08/13/2017   Procedure: CARDIOVERSION;  Surgeon: Pixie Casino, MD;  Location: [facility] ENDOSCOPY;  Service: Cardiovascular;  Laterality: N/A;   CARDIOVERSION N/A 08/15/2019   Procedure: CARDIOVERSION;  Surgeon: Dorothy Spark, MD;  Location: [facility] ENDOSCOPY;  Service: Cardiovascular;  Laterality: N/A;   CARDIOVERSION N/A 03/09/2020   Procedure: CARDIOVERSION;  Surgeon: Buford Dresser, MD;  Location: [place];  Service: Cardiovascular;  Laterality: N/A;  CARDIOVERSION N/A 07/06/2020   Procedure: CARDIOVERSION;  Surgeon: Sanda Klein, MD;  Location: [place] ENDOSCOPY;  Service: Cardiovascular;  Laterality: N/A;   CARDIOVERSION N/A 01/18/2021   Procedure: CARDIOVERSION;  Surgeon: Skeet Latch, MD;  Location: [place];  Service: Cardiovascular;  Laterality: N/A;   Cataract surgery Right    COLONOSCOPY     COLONOSCOPY N/A 12/30/2017   Procedure: COLONOSCOPY;  Surgeon: Rogene Houston, MD;  [facility];  Service: Endoscopy;  Laterality: N/A;   CORONARY ARTERY BYPASS GRAFT  N/A 05/22/2014   Procedure: CORONARY ARTERY BYPASS GRAFTING (CABG) times three using left internal mammary and right saphenous vein.;  Surgeon: Melrose Nakayama, MD;  Location: [place];  Service: Open Heart Surgery;  Laterality: N/A;   ENDARTERECTOMY Left 02/17/2014   Procedure: ENDARTERECTOMY CAROTID WITH PATCH ANGIOPLASTY;  Surgeon: Mal Misty, MD;  Location: [place];  Service: Vascular;  Laterality: Left;   ESOPHAGEAL DILATION N/A 08/22/2015   Procedure: ESOPHAGEAL DILATION;  Surgeon: Rogene Houston, MD;  [facility];  Service: Endoscopy;  Laterality: N/A;   ESOPHAGOGASTRODUODENOSCOPY N/A 08/22/2015   Procedure: ESOPHAGOGASTRODUODENOSCOPY (EGD);  Surgeon: Rogene Houston, MD;  [facility];  Service: Endoscopy;  Laterality: N/A;  12:45 - moved to 1:55 - Ann notified pt   ESOPHAGOGASTRODUODENOSCOPY N/A 12/30/2017   Procedure: ESOPHAGOGASTRODUODENOSCOPY (EGD);  Surgeon: Rogene Houston, MD;  [facility];  Service: Endoscopy;  Laterality: N/A;  200   EYE SURGERY     cataract extraction (right) with repair macular tear , with IOL     right   FRACTURE SURGERY     bilateral wrist fractures- one ORIF   JOINT REPLACEMENT  2011   left knee   LEFT HEART CATHETERIZATION WITH CORONARY ANGIOGRAM N/A 05/22/2014   Procedure: LEFT HEART CATHETERIZATION WITH CORONARY ANGIOGRAM;  Surgeon: Leonie Man, MD;  Location: [facility] CATH LAB;  Service: Cardiovascular;  Laterality: N/A;   LITHOTRIPSY     PARS PLANA VITRECTOMY W/ REPAIR OF MACULAR HOLE     RHINOPLASTY     TEE WITHOUT CARDIOVERSION N/A 04/29/2017   Procedure: TRANSESOPHAGEAL ECHOCARDIOGRAM (TEE) WITH PROPOFOL;  Surgeon: Satira Sark, MD;  [facility];  Service: Cardiovascular;  Laterality: N/A;   TONSILLECTOMY     TOTAL HIP ARTHROPLASTY  12/08/2011   Procedure: TOTAL HIP ARTHROPLASTY;  Surgeon: Gearlean Alf, MD;  [facility];  Service: Orthopedics;  Laterality: Right;   UPPER GI ENDOSCOPY   12/18/2015   Procedure: UPPER GI ENDOSCOPY;  Surgeon: Ralene Ok, MD;  [facility];  Service: General;;   WRIST SURGERY Right 63yrs ago   WRIST SURGERY Left     Current Outpatient Medications  Medication Sig Dispense Refill   acetaminophen (TYLENOL) 500 MG tablet Take 1,000 mg by mouth every 6 (six) hours as needed for moderate pain.     amiodarone (PACERONE) 200 MG tablet Take 1 tablet by mouth twice a day for 1 month then reduce to 1 tablet daily 60 tablet 0   amoxicillin (AMOXIL) 500 MG capsule Take 2,000 mg by mouth See admin instructions. Take 4 capsules (2000 mg) by mouth before dental procedures     aspirin 325 MG tablet Take 325 mg by mouth as needed.     atorvastatin (LIPITOR) 80 MG tablet TAKE 1 TABLET BY MOUTH EVERY EVENING 90 tablet 3   colchicine 0.6 MG tablet Take 0.6 mg by mouth daily as needed (for gout flare up).  Cyanocobalamin (B-12) 1000 MCG SUBL Place 1,000 mcg under the tongue in the morning.     cyclobenzaprine (FLEXERIL) 10 MG tablet Take 10 mg by mouth 2 (two) times daily as needed for muscle spasms.     diltiazem (CARDIZEM CD) 240 MG 24 hr capsule Take 1 capsule (240 mg total) by mouth daily. 90 capsule 3   diltiazem (CARDIZEM) 30 MG tablet Take 1 tablet every 4 hours AS NEEDED for heart rate >100 30 tablet 1   doxazosin (CARDURA) 4 MG tablet Take 4 mg by mouth in the morning.  4   finasteride (PROSCAR) 5 MG tablet Take 5 mg by mouth every evening.      fluticasone (FLONASE) 50 MCG/ACT nasal spray Place 1 spray into both nostrils daily as needed for allergies or rhinitis.     furosemide (LASIX) 40 MG tablet TAKE 1 TABLET BY MOUTH TWICE A DAY 180 tablet 0   lisinopril (PRINIVIL,ZESTRIL) 40 MG tablet Take 40 mg by mouth in the morning.     metoprolol succinate (TOPROL XL) 25 MG 24 hr tablet Take 1 tablet (25 mg total) by mouth at bedtime. 30 tablet 3   nitroGLYCERIN (NITROSTAT) 0.4 MG SL tablet Place 1 tablet (0.4 mg total) under the tongue every 5 (five)  minutes x 3 doses as needed for chest pain. 75 tablet 3   pantoprazole (PROTONIX) 40 MG tablet Take 1 tablet (40 mg total) by mouth daily before breakfast. 90 tablet 3   rivaroxaban (XARELTO) 20 MG TABS tablet Take 1 tablet (20 mg total) by mouth daily with supper. 28 tablet 0   No current facility-administered medications for this encounter.    Allergies  Allergen Reactions   Codeine Sulfate Nausea Only    Social History   Socioeconomic History   Marital status: Married    Spouse name: Not on file   Number of children: Not on file   Years of education: Not on file   Highest education level: Not on file  Occupational History   Occupation: retired    Comment: [employer]  Tobacco Use   Smoking status: Former    Packs/day: 1.50    Years: 25.00    Pack years: 37.50    Types: Cigarettes    Start date: 08/08/1960    Quit date: 06/02/1982    Years since quitting: 38.9   Smokeless tobacco: Never   Tobacco comments:    quit smoking 30+yrs ago  Vaping Use   Vaping Use: Never used  Substance and Sexual Activity   Alcohol use: No    Alcohol/week: 0.0 standard drinks    Comment: occasionally    Drug use: No   Sexual activity: Yes  Other Topics Concern   Not on file  Social History Narrative   Not on file   Social Determinants of Health   Financial Resource Strain: Not on file  Food Insecurity: Not on file  Transportation Needs: Not on file  Physical Activity: Not on file  Stress: Not on file  Social Connections: Not on file  Intimate Partner Violence: Not on file    Family History  Problem Relation Age of Onset   Allergies Mother    Heart disease Mother    Hypertension Mother    Heart disease Father        MI    ROS- All systems are reviewed and negative except as per the HPI above  Physical Exam: Vitals:   05/22/21 1124  Pulse: (!) 101  Wt Readings from Last 3 Encounters:  05/09/21 105.3 kg  04/19/21 104.1 kg  03/13/21 104.3 kg     Labs: Lab Results  Component Value Date   NA 140 05/15/2021   K 4.4 05/15/2021   CL 107 05/15/2021   CO2 25 05/15/2021   GLUCOSE 88 05/15/2021   BUN 31 (H) 05/15/2021   CREATININE 1.88 (H) 05/15/2021   CALCIUM 8.9 05/15/2021   PHOS 3.3 04/29/2017   MG 1.9 04/30/2017   Lab Results  Component Value Date   INR 2.2 (H) 08/15/2019   Lab Results  Component Value Date   CHOL 134 03/22/2015   HDL 41 03/22/2015   LDLCALC 75 03/22/2015   TRIG 92 03/22/2015     GEN- The patient is well appearing, alert and oriented x 3 today.   Head- normocephalic, atraumatic Eyes-  Sclera clear, conjunctiva pink Ears- hearing intact Oropharynx- clear Neck- supple, no JVP Lymph- no cervical lymphadenopathy Lungs- Clear to ausculation bilaterally, normal work of breathing Heart- irregular rate and rhythm, no murmurs, rubs or gallops, PMI not laterally displaced GI- soft, NT, ND, + BS Extremities- no clubbing, cyanosis, or edema MS- no significant deformity or atrophy Skin- no rash or lesion Psych- euthymic mood, full affect Neuro- strength and sensation are intact  EKG- afib at 101 bpm, qrs int 92 ms, qtc 451 ms     Assessment and Plan: 1. Afib   Ablation x 2 with last  one in July 2022 Successful cardioversion in August of this year  Now back in Afib since last of November  Adding metoprolol slowed the v rate but he remains in afib Continue 240 mg cardizem daily, and 30 mg cardizem as needed    Stopped BB and added amiodarone 200 mg bid( was on previously prior to first ablation and stopped as he was staying in [place])  Will plan on cardioversion  January 16th 2023 Risk vrs benefit of procedure discussed  Bmet/cbc am of cardioversion with repeat EKG   2. CHA2DS2VASc score of 6 Continue  xarelto 20 mg daily  No missed doses for at least 3 weeks   F/u one week after cardioversion    Butch Penny C. Shaneisha Burkel, [facility] [address] [place], [place]  [address] [phone]

## 2021-05-22 NOTE — Patient Instructions (Signed)
Cardioversion scheduled for Monday January 16  - Arrive at the [facility] and go to admitting at 9:30am  - Do not eat or drink anything after midnight the night prior to your procedure.  - Take all your morning medication (except diabetic medications) with a sip of water prior to arrival.  - You will not be able to drive home after your procedure.  - Do NOT miss any doses of your blood thinner - if you should miss a dose please notify our office immediately.  - If you feel as if you go back into normal rhythm prior to scheduled cardioversion, please notify our office immediately. If your procedure is canceled in the cardioversion suite you will be charged a cancellation fee.  Patients will be asked to: to mask in public and hand hygiene (no longer quarantine) in the 3 days prior to surgery, to report if any COVID-19-like illness or household contacts to COVID-19 to determine need for testing

## 2021-05-31 ENCOUNTER — Other Ambulatory Visit (HOSPITAL_COMMUNITY): Payer: Self-pay

## 2021-05-31 MED ORDER — RIVAROXABAN 20 MG PO TABS: 20.0000 mg | ORAL_TABLET | Freq: Every day | ORAL | 0 refills | Status: DC

## 2021-06-02 DIAGNOSIS — J8489 Other specified interstitial pulmonary diseases: Secondary | ICD-10-CM

## 2021-06-02 HISTORY — DX: Other specified interstitial pulmonary diseases: J84.89

## 2021-06-06 ENCOUNTER — Encounter (HOSPITAL_COMMUNITY): Payer: Self-pay | Admitting: Cardiovascular Disease

## 2021-06-06 ENCOUNTER — Other Ambulatory Visit (HOSPITAL_COMMUNITY): Payer: Self-pay | Admitting: Nurse Practitioner

## 2021-06-10 ENCOUNTER — Ambulatory Visit: Payer: Medicare Other | Admitting: Internal Medicine

## 2021-06-17 ENCOUNTER — Ambulatory Visit (HOSPITAL_COMMUNITY)
Admission: RE | Admit: 2021-06-17 | Discharge: 2021-06-17 | Disposition: A | Discharge status: Home / Self Care | Payer: Medicare HMO | Source: Ambulatory Visit | Attending: Physician Assistant | Admitting: Physician Assistant

## 2021-06-17 ENCOUNTER — Other Ambulatory Visit: Payer: Self-pay

## 2021-06-17 ENCOUNTER — Encounter (HOSPITAL_COMMUNITY)
Admission: RE | Disposition: A | Discharge status: Home / Self Care | Payer: Self-pay | Source: Home / Self Care | Attending: Cardiovascular Disease

## 2021-06-17 ENCOUNTER — Ambulatory Visit (HOSPITAL_COMMUNITY)
Admission: RE | Admit: 2021-06-17 | Discharge: 2021-06-17 | Disposition: A | Discharge status: Home / Self Care | Payer: Medicare HMO | Attending: Cardiovascular Disease | Admitting: Cardiovascular Disease

## 2021-06-17 ENCOUNTER — Encounter (HOSPITAL_COMMUNITY): Payer: Self-pay | Admitting: Cardiovascular Disease

## 2021-06-17 ENCOUNTER — Ambulatory Visit (HOSPITAL_COMMUNITY): Payer: Medicare HMO | Admitting: Anesthesiology

## 2021-06-17 DIAGNOSIS — G4730 Sleep apnea, unspecified: Secondary | ICD-10-CM | POA: Diagnosis not present

## 2021-06-17 DIAGNOSIS — I4891 Unspecified atrial fibrillation: Secondary | ICD-10-CM | POA: Insufficient documentation

## 2021-06-17 DIAGNOSIS — Z87891 Personal history of nicotine dependence: Secondary | ICD-10-CM | POA: Insufficient documentation

## 2021-06-17 DIAGNOSIS — M1990 Unspecified osteoarthritis, unspecified site: Secondary | ICD-10-CM | POA: Insufficient documentation

## 2021-06-17 DIAGNOSIS — I272 Pulmonary hypertension, unspecified: Secondary | ICD-10-CM | POA: Insufficient documentation

## 2021-06-17 DIAGNOSIS — I509 Heart failure, unspecified: Secondary | ICD-10-CM | POA: Insufficient documentation

## 2021-06-17 DIAGNOSIS — K219 Gastro-esophageal reflux disease without esophagitis: Secondary | ICD-10-CM | POA: Insufficient documentation

## 2021-06-17 DIAGNOSIS — I110 Hypertensive heart disease with heart failure: Secondary | ICD-10-CM | POA: Diagnosis not present

## 2021-06-17 DIAGNOSIS — I2510 Atherosclerotic heart disease of native coronary artery without angina pectoris: Secondary | ICD-10-CM | POA: Diagnosis not present

## 2021-06-17 DIAGNOSIS — Z7901 Long term (current) use of anticoagulants: Secondary | ICD-10-CM | POA: Diagnosis not present

## 2021-06-17 DIAGNOSIS — I252 Old myocardial infarction: Secondary | ICD-10-CM | POA: Diagnosis not present

## 2021-06-17 HISTORY — PX: CARDIOVERSION: SHX1299

## 2021-06-17 LAB — BASIC METABOLIC PANEL
Anion gap: 11 (ref 5–15)
BUN: 31 mg/dL — ABNORMAL HIGH (ref 8–23)
CO2: 24 mmol/L (ref 22–32)
Calcium: 8.7 mg/dL — ABNORMAL LOW (ref 8.9–10.3)
Chloride: 105 mmol/L (ref 98–111)
Creatinine, Ser: 1.74 mg/dL — ABNORMAL HIGH (ref 0.61–1.24)
GFR, Estimated: 40 mL/min — ABNORMAL LOW (ref >60)
Glucose, Bld: 107 mg/dL — ABNORMAL HIGH (ref 70–99)
Potassium: 4.5 mmol/L (ref 3.5–5.1)
Sodium: 140 mmol/L (ref 135–145)

## 2021-06-17 LAB — CBC
HCT: 37.6 % — ABNORMAL LOW (ref 39.0–52.0)
Hemoglobin: 11.5 g/dL — ABNORMAL LOW (ref 13.0–17.0)
MCH: 30.7 pg (ref 26.0–34.0)
MCHC: 30.6 g/dL (ref 30.0–36.0)
MCV: 100.5 fL — ABNORMAL HIGH (ref 80.0–100.0)
Platelets: 156 10*3/uL (ref 150–400)
RBC: 3.74 MIL/uL — ABNORMAL LOW (ref 4.22–5.81)
RDW: 13.8 % (ref 11.5–15.5)
WBC: 6.7 10*3/uL (ref 4.0–10.5)
nRBC: 0 % (ref 0.0–0.2)

## 2021-06-17 SURGERY — CARDIOVERSION
Anesthesia: General

## 2021-06-17 MED ORDER — PROPOFOL 10 MG/ML IV BOLUS
INTRAVENOUS | Status: DC | PRN
  Administered 2021-06-17: 50 mg via INTRAVENOUS

## 2021-06-17 MED ORDER — LIDOCAINE HCL (PF) 2 % IJ SOLN
INTRAMUSCULAR | Status: DC | PRN
  Administered 2021-06-17: 60 mg via INTRADERMAL

## 2021-06-17 MED ORDER — SODIUM CHLORIDE 0.9 % IV SOLN
INTRAVENOUS | Status: AC | PRN
  Administered 2021-06-17: 500 mL via INTRAVENOUS

## 2021-06-17 NOTE — Anesthesia Procedure Notes (Signed)
Procedure Name: General with mask airway Date/Time: 06/17/2021 9:45 AM Performed by: Jenne Campus, CRNA Pre-anesthesia Checklist: Patient identified, Emergency Drugs available, Suction available and Patient being monitored Patient Re-evaluated:Patient Re-evaluated prior to induction Oxygen Delivery Method: Ambu bag Preoxygenation: Pre-oxygenation with 100% oxygen Induction Type: IV induction

## 2021-06-17 NOTE — CV Procedure (Signed)
° °  DIRECT CURRENT CARDIOVERSION  NAME:  Caleb Wolf    MRN: [id number] DOB:  1941/03/08    ADMIT DATE: 06/17/2021  Indication:  Symptomatic atrial fibrillation   Procedure Note:  The patient signed informed consent.  They have had had therapeutic anticoagulation with xarelto greater than 3 weeks.  Anesthesia was administered by Dr. Doroteo Glassman.  Adequate airway was maintained throughout and vital followed per protocol.  They were cardioverted x 1 with 200J of biphasic synchronized energy.  They converted to NSR.  There were no apparent complications.  The patient had normal neuro status and respiratory status post procedure with vitals stable as recorded elsewhere.    Follow up: They will continue on current medical therapy and follow up with cardiology as scheduled.  [place]ake Bells T. Audie Box, MD, [facility]  [address], [place], [place] [address] [phone]  9:50 AM

## 2021-06-17 NOTE — Anesthesia Postprocedure Evaluation (Signed)
Anesthesia Post Note  Patient: Caleb Wolf  Procedure(s) Performed: CARDIOVERSION     Patient location during evaluation: PACU Anesthesia Type: General Level of consciousness: awake and alert, oriented and patient cooperative Pain management: pain level controlled Vital Signs Assessment: post-procedure vital signs reviewed and stable Respiratory status: spontaneous breathing, nonlabored ventilation and respiratory function stable Cardiovascular status: blood pressure returned to baseline and stable Postop Assessment: no apparent nausea or vomiting Anesthetic complications: no   No notable events documented.  Last Vitals:  Vitals:   06/17/21 1010 06/17/21 1018  BP:  (!) 119/46  Pulse: 64 60  Resp: (!) 21 18  Temp:    SpO2: 96% 97%    Last Pain:  Vitals:   06/17/21 1018  TempSrc:   PainSc: 0-No pain                 Pervis Hocking

## 2021-06-17 NOTE — Interval H&P Note (Signed)
History and Physical Interval Note:  06/17/2021 9:31 AM  Caleb Wolf  has presented today for surgery, with the diagnosis of AFIB.  The various methods of treatment have been discussed with the patient and family. After consideration of risks, benefits and other options for treatment, the patient has consented to  Procedure(s): CARDIOVERSION (N/A) as a surgical intervention.  The patient's history has been reviewed, patient examined, no change in status, stable for surgery.  I have reviewed the patient's chart and labs.  Questions were answered to the patient's satisfaction.    NPO for DCCV. On xarelto. No missed doses >3 weeks.   [place] Bells T. Audie Box, MD, [facility]  [address] [place], [place] [address] [phone]  9:31 AM

## 2021-06-17 NOTE — Anesthesia Preprocedure Evaluation (Addendum)
Anesthesia Evaluation  Patient identified by MRN, date of birth, ID band Patient awake    Reviewed: Allergy & Precautions, NPO status , Patient's Chart, lab work & pertinent test results, reviewed documented beta blocker date and time   Airway Mallampati: III  TM Distance: >3 FB Neck ROM: Full    Dental no notable dental hx. (+) Dental Advisory Given   Pulmonary sleep apnea , former smoker,  Quit smoking 1984, 38 pack year history    Pulmonary exam normal breath sounds clear to auscultation       Cardiovascular hypertension, Pt. on medications and Pt. on home beta blockers pulmonary hypertension (moderate pHTN)+ CAD, + Past MI and +CHF (grade 2 diastolic dysfunction)  Normal cardiovascular exam+ dysrhythmias (xarelto) Atrial Fibrillation  Rhythm:Regular Rate:Normal  Echo 2021: 1. Left ventricular ejection fraction, by estimation, is 50 to 55%. The  left ventricle has low normal function. The left ventricle has no regional  wall motion abnormalities. Left ventricular diastolic parameters are  consistent with Grade II diastolic  dysfunction (pseudonormalization). Elevated left atrial pressure.  2. Right ventricular systolic function is normal. The right ventricular  size is normal. There is moderately elevated pulmonary artery systolic  pressure.  3. Left atrial size was moderately dilated.  4. Right atrial size was moderately dilated.  5. The mitral valve is normal in structure. Trivial mitral valve  regurgitation. No evidence of mitral stenosis.  6. The aortic valve has an indeterminant number of cusps. Aortic valve  regurgitation is not visualized. No aortic stenosis is present.  7. Moderate pulmonary HTN, PASP is 48 mmHg.  8. The inferior vena cava is dilated in size with >50% respiratory  variability, suggesting right atrial pressure of 8 mmHg.    Neuro/Psych negative neurological ROS  negative psych ROS    GI/Hepatic Neg liver ROS, GERD  Controlled,  Endo/Other  negative endocrine ROS  Renal/GU Renal disease  negative genitourinary   Musculoskeletal  (+) Arthritis , Osteoarthritis,    Abdominal   Peds  Hematology negative hematology ROS (+)   Anesthesia Other Findings   Reproductive/Obstetrics negative OB ROS                            Anesthesia Physical Anesthesia Plan  ASA: 3  Anesthesia Plan: General   Post-op Pain Management:    Induction: Intravenous  PONV Risk Score and Plan: TIVA and Treatment may vary due to age or medical condition  Airway Management Planned: Natural Airway and Mask  Additional Equipment: None  Intra-op Plan:   Post-operative Plan:   Informed Consent: I have reviewed the patients History and Physical, chart, labs and discussed the procedure including the risks, benefits and alternatives for the proposed anesthesia with the patient or authorized representative who has indicated his/her understanding and acceptance.       Plan Discussed with: CRNA  Anesthesia Plan Comments:         Anesthesia Quick Evaluation

## 2021-06-17 NOTE — Transfer of Care (Signed)
Immediate Anesthesia Transfer of Care Note  Patient: Caleb Wolf  Procedure(s) Performed: CARDIOVERSION  Patient Location: Endoscopy Unit  Anesthesia Type:General  Level of Consciousness: awake, oriented and patient cooperative  Airway & Oxygen Therapy: Patient Spontanous Breathing and Patient connected to nasal cannula oxygen  Post-op Assessment: Report given to RN and Post -op Vital signs reviewed and stable  Post vital signs: Reviewed  Last Vitals:  Vitals Value Taken Time  BP    Temp    Pulse    Resp    SpO2      Last Pain:  Vitals:   06/17/21 0918  TempSrc: Temporal  PainSc: 0-No pain         Complications: No notable events documented.

## 2021-06-18 ENCOUNTER — Encounter (HOSPITAL_COMMUNITY): Payer: Self-pay | Admitting: Cardiovascular Disease

## 2021-06-25 ENCOUNTER — Ambulatory Visit (HOSPITAL_COMMUNITY)
Admission: RE | Admit: 2021-06-25 | Discharge: 2021-06-25 | Disposition: A | Discharge status: Home / Self Care | Payer: Medicare HMO | Source: Ambulatory Visit | Attending: Nurse Practitioner | Admitting: Nurse Practitioner

## 2021-06-25 ENCOUNTER — Other Ambulatory Visit: Payer: Self-pay

## 2021-06-25 VITALS — BP 140/52 | HR 64 | Ht 70.0 in | Wt 245.0 lb

## 2021-06-25 DIAGNOSIS — D6869 Other thrombophilia: Secondary | ICD-10-CM | POA: Diagnosis not present

## 2021-06-25 DIAGNOSIS — Z79899 Other long term (current) drug therapy: Secondary | ICD-10-CM | POA: Insufficient documentation

## 2021-06-25 DIAGNOSIS — Z7901 Long term (current) use of anticoagulants: Secondary | ICD-10-CM | POA: Diagnosis not present

## 2021-06-25 DIAGNOSIS — I4819 Other persistent atrial fibrillation: Secondary | ICD-10-CM | POA: Diagnosis not present

## 2021-06-25 DIAGNOSIS — I4891 Unspecified atrial fibrillation: Secondary | ICD-10-CM | POA: Diagnosis not present

## 2021-06-25 LAB — CBC
HCT: 36 % — ABNORMAL LOW (ref 39.0–52.0)
Hemoglobin: 11.4 g/dL — ABNORMAL LOW (ref 13.0–17.0)
MCH: 31.1 pg (ref 26.0–34.0)
MCHC: 31.7 g/dL (ref 30.0–36.0)
MCV: 98.4 fL (ref 80.0–100.0)
Platelets: 152 10*3/uL (ref 150–400)
RBC: 3.66 MIL/uL — ABNORMAL LOW (ref 4.22–5.81)
RDW: 13.9 % (ref 11.5–15.5)
WBC: 6.9 10*3/uL (ref 4.0–10.5)
nRBC: 0 % (ref 0.0–0.2)

## 2021-06-25 LAB — COMPREHENSIVE METABOLIC PANEL
ALT: 21 U/L (ref 0–44)
AST: 13 U/L — ABNORMAL LOW (ref 15–41)
Albumin: 3.7 g/dL (ref 3.5–5.0)
Alkaline Phosphatase: 68 U/L (ref 38–126)
Anion gap: 8 (ref 5–15)
BUN: 30 mg/dL — ABNORMAL HIGH (ref 8–23)
CO2: 27 mmol/L (ref 22–32)
Calcium: 9 mg/dL (ref 8.9–10.3)
Chloride: 105 mmol/L (ref 98–111)
Creatinine, Ser: 1.92 mg/dL — ABNORMAL HIGH (ref 0.61–1.24)
GFR, Estimated: 35 mL/min — ABNORMAL LOW (ref >60)
Glucose, Bld: 115 mg/dL — ABNORMAL HIGH (ref 70–99)
Potassium: 4.4 mmol/L (ref 3.5–5.1)
Sodium: 140 mmol/L (ref 135–145)
Total Bilirubin: 0.4 mg/dL (ref 0.3–1.2)
Total Protein: 6.3 g/dL — ABNORMAL LOW (ref 6.5–8.1)

## 2021-06-25 LAB — TSH: TSH: 3.64 u[IU]/mL (ref 0.350–4.500)

## 2021-06-25 MED ORDER — AMIODARONE HCL 200 MG PO TABS: 200.0000 mg | ORAL_TABLET | Freq: Every day | ORAL | 2 refills | Status: DC

## 2021-06-25 NOTE — Patient Instructions (Signed)
Decrease amiodarone to 200mg  once a day   Follow up with Dr. Marlou Porch - their office will call

## 2021-06-25 NOTE — Progress Notes (Signed)
Primary Care Physician: Asencion Noble, MD Referring Physician: [name] is a 81 y.o. male with a h/o afib, s/p ablation 2019 and repeat ablation in July 2022. He had his last cardioversion in July of this year. He is in the afib clinic as he noted return of afib since the end of November. All the strips I reviewed in his phone show afib. He assumed if his HR was slower he had returned to SR but his strips do not show this to be the case. Most of the HR's are over 100 bpm. Instead of pt trying to understand when to take the extra Cardizem 30 mg daily, I will add 25 mg toprol hs for better rate control and may get lucky and flip him back to SR. Overall, other than some fatigue, he is tolerating afib well.    Return to [facility] clinic, 05/15/21. He remains in afib , but slower with addition of BB. Discussion re restoring SR. Amiodarone, Tikosyn and  repeat cardioversion alone  was dicussed. He had been on amiodarone at one time and it was stopped after his first ablation as he was staying in SR and brady was noted.  After lengthy discussion, he and his wife decided on amiodarone.   Return to [facility] clinic, 05/22/21. He has been on amiodarone 200 mg bid for over a week. He is tolerating well. Remains in afib, rate controlled.  Will plan on cardioversion mid January. No missed anticoagulation.   He returns to afib clinic, 06/25/21. He was successfully cardioverted and remains in SR. He will go down to 200 mg of amiodarone daily now. He is having some issues with some LLE, and his energy remains low despite being back in rhythm. His wife noted more LLE several days after the cardioversion and increased his lasix to bid for several days. He will increase his lasix occassionally for LLE, left leg is chrnic but has more rt LEE as well. His pre procedure CBC showed some mild drop in H/H. Will repeat this as well as TSH/CMET today.Will repeat Echo at [facility] and refer to Dr. Marlou Porch in the [place] area as  he does not prefer to drive to [place].   Today, he denies symptoms of palpitations, chest pain, shortness of breath, orthopnea, PND, lower extremity edema, dizziness, presyncope, syncope, or neurologic sequela. The patient is tolerating medications without difficulties and is otherwise without complaint today.   Past Medical History:  Diagnosis Date   Arthritis    Basal cell carcinoma    Carotid stenosis    Coronary artery disease    Multvessel s/p CABG 2015   Enlarged prostate    Essential hypertension    GERD (gastroesophageal reflux disease)    Gout    History of colon polyps    History of kidney stones    Hyperlipidemia    Lumbar disc disease    Persistent atrial fibrillation (HCC)    Sleep apnea    CPAP   Past Surgical History:  Procedure Laterality Date   ABLATION OF DYSRHYTHMIC FOCUS  07/28/2017   ATRIAL FIBRILLATION ABLATION N/A 07/28/2017   Procedure: ATRIAL FIBRILLATION ABLATION;  Surgeon: Thompson Grayer, MD;  Location: [facility] CV LAB;  Service: Cardiovascular;  Laterality: N/A;   ATRIAL FIBRILLATION ABLATION N/A 12/04/2020   Procedure: ATRIAL FIBRILLATION ABLATION;  Surgeon: Thompson Grayer, MD;  Location: [facility] CV LAB;  Service: Cardiovascular;  Laterality: N/A;   BACK SURGERY     CARDIAC CATHETERIZATION  05/22/2014   Procedure: IABP INSERTION;  Surgeon: Leonie Man, MD;  Location[facility] CATH LAB;  Service: Cardiovascular;;   CARDIOVERSION N/A 04/29/2017   Procedure: CARDIOVERSION;  Surgeon: Satira Sark, MD;  [facility];  Service: Cardiovascular;  Laterality: N/A;   CARDIOVERSION N/A 08/13/2017   Procedure: CARDIOVERSION;  Surgeon: Pixie Casino, MD;  Location: [facility];  Service: Cardiovascular;  Laterality: N/A;   CARDIOVERSION N/A 08/15/2019   Procedure: CARDIOVERSION;  Surgeon: Dorothy Spark, MD;  Location: [facility] ENDOSCOPY;  Service: Cardiovascular;  Laterality: N/A;   CARDIOVERSION N/A 03/09/2020   Procedure: CARDIOVERSION;   Surgeon: Buford Dresser, MD;  Location: [facility];  Service: Cardiovascular;  Laterality: N/A;   CARDIOVERSION N/A 07/06/2020   Procedure: CARDIOVERSION;  Surgeon: Sanda Klein, MD;  Location: [facility];  Service: Cardiovascular;  Laterality: N/A;   CARDIOVERSION N/A 01/18/2021   Procedure: CARDIOVERSION;  Surgeon: Skeet Latch, MD;  Location: [facility] ENDOSCOPY;  Service: Cardiovascular;  Laterality: N/A;   CARDIOVERSION N/A 06/17/2021   Procedure: CARDIOVERSION;  Surgeon: Geralynn Rile, MD;  Location: [facility];  Service: Cardiovascular;  Laterality: N/A;   Cataract surgery Right    COLONOSCOPY     COLONOSCOPY N/A 12/30/2017   Procedure: COLONOSCOPY;  Surgeon: Rogene Houston, MD;  [facility];  Service: Endoscopy;  Laterality: N/A;   CORONARY ARTERY BYPASS GRAFT N/A 05/22/2014   Procedure: CORONARY ARTERY BYPASS GRAFTING (CABG) times three using left internal mammary and right saphenous vein.;  Surgeon: Melrose Nakayama, MD;  Location: [facility];  Service: Open Heart Surgery;  Laterality: N/A;   ENDARTERECTOMY Left 02/17/2014   Procedure: ENDARTERECTOMY CAROTID WITH PATCH ANGIOPLASTY;  Surgeon: Mal Misty, MD;  Location: [facility];  Service: Vascular;  Laterality: Left;   ESOPHAGEAL DILATION N/A 08/22/2015   Procedure: ESOPHAGEAL DILATION;  Surgeon: Rogene Houston, MD;  [facility];  Service: Endoscopy;  Laterality: N/A;   ESOPHAGOGASTRODUODENOSCOPY N/A 08/22/2015   Procedure: ESOPHAGOGASTRODUODENOSCOPY (EGD);  Surgeon: Rogene Houston, MD;  [facility];  Service: Endoscopy;  Laterality: N/A;  12:45 - moved to 1:55 - Ann notified pt   ESOPHAGOGASTRODUODENOSCOPY N/A 12/30/2017   Procedure: ESOPHAGOGASTRODUODENOSCOPY (EGD);  Surgeon: Rogene Houston, MD;  [facility];  Service: Endoscopy;  Laterality: N/A;  200   EYE SURGERY     cataract extraction (right) with repair macular tear , with IOL     right   FRACTURE SURGERY      bilateral wrist fractures- one ORIF   JOINT REPLACEMENT  2011   left knee   LEFT HEART CATHETERIZATION WITH CORONARY ANGIOGRAM N/A 05/22/2014   Procedure: LEFT HEART CATHETERIZATION WITH CORONARY ANGIOGRAM;  Surgeon: Leonie Man, MD;  Location: [facility] CATH LAB;  Service: Cardiovascular;  Laterality: N/A;   LITHOTRIPSY     PARS PLANA VITRECTOMY W/ REPAIR OF MACULAR HOLE     RHINOPLASTY     TEE WITHOUT CARDIOVERSION N/A 04/29/2017   Procedure: TRANSESOPHAGEAL ECHOCARDIOGRAM (TEE) WITH PROPOFOL;  Surgeon: Satira Sark, MD;  [facility];  Service: Cardiovascular;  Laterality: N/A;   TONSILLECTOMY     TOTAL HIP ARTHROPLASTY  12/08/2011   Procedure: TOTAL HIP ARTHROPLASTY;  Surgeon: Gearlean Alf, MD;  Location: [facility];  Service: Orthopedics;  Laterality: Right;   UPPER GI ENDOSCOPY  12/18/2015   Procedure: UPPER GI ENDOSCOPY;  Surgeon: Ralene Ok, MD;  Location: [facility] ORS;  Service: General;;   WRIST SURGERY Right 9yrs  ago   WRIST SURGERY Left     Current Outpatient Medications  Medication Sig Dispense Refill   acetaminophen (TYLENOL) 500 MG tablet Take 1,000 mg by mouth every 6 (six) hours as needed for moderate pain.     amiodarone (PACERONE) 200 MG tablet TAKE 1 TABLET BY MOUTH TWICE A DAY FOR 1 MONTH THEN REDUCE TO 1 TABLET DAILY (Patient taking differently: Take 200 mg by mouth daily.) 60 tablet 0   amoxicillin (AMOXIL) 500 MG capsule Take 2,000 mg by mouth See admin instructions. Take 4 capsules (2000 mg) by mouth before dental procedures     aspirin 325 MG tablet Take 325 mg by mouth daily as needed for moderate pain.     atorvastatin (LIPITOR) 80 MG tablet TAKE 1 TABLET BY MOUTH EVERY EVENING 90 tablet 3   colchicine 0.6 MG tablet Take 0.6 mg by mouth daily as needed (for gout flare up).     Cyanocobalamin (B-12) 1000 MCG SUBL Place 1,000 mcg under the tongue in the morning.     cyclobenzaprine (FLEXERIL) 10 MG tablet Take 10 mg by mouth 2 (two) times daily as  needed for muscle spasms.     diltiazem (CARDIZEM CD) 240 MG 24 hr capsule Take 1 capsule (240 mg total) by mouth daily. 90 capsule 3   diltiazem (CARDIZEM) 30 MG tablet Take 1 tablet every 4 hours AS NEEDED for heart rate >100 30 tablet 1   doxazosin (CARDURA) 4 MG tablet Take 4 mg by mouth in the morning.  4   finasteride (PROSCAR) 5 MG tablet Take 5 mg by mouth every evening.      fluticasone (FLONASE) 50 MCG/ACT nasal spray Place 1 spray into both nostrils daily as needed for allergies or rhinitis.     furosemide (LASIX) 40 MG tablet TAKE 1 TABLET BY MOUTH TWICE A DAY (Patient taking differently: Take 40 mg by mouth daily.) 180 tablet 0   lisinopril (PRINIVIL,ZESTRIL) 40 MG tablet Take 40 mg by mouth in the morning.     nitroGLYCERIN (NITROSTAT) 0.4 MG SL tablet Place 1 tablet (0.4 mg total) under the tongue every 5 (five) minutes x 3 doses as needed for chest pain. 75 tablet 3   pantoprazole (PROTONIX) 40 MG tablet Take 1 tablet (40 mg total) by mouth daily before breakfast. 90 tablet 3   rivaroxaban (XARELTO) 20 MG TABS tablet Take 1 tablet (20 mg total) by mouth daily with supper. 28 tablet 0   No current facility-administered medications for this encounter.    Allergies  Allergen Reactions   Codeine Sulfate Nausea Only    Social History   Socioeconomic History   Marital status: Married    Spouse name: Not on file   Number of children: Not on file   Years of education: Not on file   Highest education level: Not on file  Occupational History   Occupation: retired    Comment: [employer]  Tobacco Use   Smoking status: Former    Packs/day: 1.50    Years: 25.00    Pack years: 37.50    Types: Cigarettes    Start date: 08/08/1960    Quit date: 06/02/1982    Years since quitting: 39.0   Smokeless tobacco: Never   Tobacco comments:    quit smoking 30+yrs ago  Vaping Use   Vaping Use: Never used  Substance and Sexual Activity   Alcohol use: No    Alcohol/week:  0.0 standard drinks    Comment: occasionally  Drug use: No   Sexual activity: Yes  Other Topics Concern   Not on file  Social History Narrative   Not on file   Social Determinants of Health   Financial Resource Strain: Not on file  Food Insecurity: Not on file  Transportation Needs: Not on file  Physical Activity: Not on file  Stress: Not on file  Social Connections: Not on file  Intimate Partner Violence: Not on file    Family History  Problem Relation Age of Onset   Allergies Mother    Heart disease Mother    Hypertension Mother    Heart disease Father        MI    ROS- All systems are reviewed and negative except as per the HPI above  Physical Exam: Vitals:   06/25/21 1018  BP: (!) 140/52  Pulse: 64  Weight: 111.1 kg  Height: 5\' 10"  (1.778 m)    Wt Readings from Last 3 Encounters:  06/25/21 111.1 kg  06/17/21 108.9 kg  05/09/21 105.3 kg    Labs: Lab Results  Component Value Date   NA 140 06/17/2021   K 4.5 06/17/2021   CL 105 06/17/2021   CO2 24 06/17/2021   GLUCOSE 107 (H) 06/17/2021   BUN 31 (H) 06/17/2021   CREATININE 1.74 (H) 06/17/2021   CALCIUM 8.7 (L) 06/17/2021   PHOS 3.3 04/29/2017   MG 1.9 04/30/2017   Lab Results  Component Value Date   INR 2.2 (H) 08/15/2019   Lab Results  Component Value Date   CHOL 134 03/22/2015   HDL 41 03/22/2015   LDLCALC 75 03/22/2015   TRIG 92 03/22/2015     GEN- The patient is well appearing, alert and oriented x 3 today.   Head- normocephalic, atraumatic Eyes-  Sclera clear, conjunctiva pink Ears- hearing intact Oropharynx- clear Neck- supple, no JVP Lymph- no cervical lymphadenopathy Lungs- Clear to ausculation bilaterally, normal work of breathing Heart- regular rate and rhythm, no murmurs, rubs or gallops, PMI not laterally displaced GI- soft, NT, ND, + BS Extremities- no clubbing, cyanosis, or edema MS- no significant deformity or atrophy Skin- no rash or lesion Psych- euthymic mood,  full affect Neuro- strength and sensation are intact  EKG-  Vent. rate 64 BPM PR interval 206 ms QRS duration 98 ms QT/QTcB 450/464 ms P-R-T axes 34 75 69 Sinus rhythm with Premature atrial complexes with Abberant conduction Septal infarct , age undetermined Abnormal ECG When compared with ECG of 17-Jun-2021 09:57, PREVIOUS ECG IS PRESENT    Assessment and Plan: 1. Afib   Ablation x 2 with last  one in July 2022 Successful cardioversion in August of this year but ERAF  Recent successful cardioversion after loading of amiodarone 200 mg bid x one month Now go down to 200 mg daily  Continue 240 mg cardizem daily, and 30 mg cardizem as needed    Still with fatigue/LEE, despite being in SR, repeat echo, cbc,cmet,tsh   2. CHA2DS2VASc score of 6 Continue  xarelto 20 mg daily   To get established with [facility] in [place], request appointment in 4-6 weeks,  afib clinic as needed   Butch Penny C. June Vacha, [facility] [address] [place], [place] [address] [phone]

## 2021-07-04 ENCOUNTER — Other Ambulatory Visit (HOSPITAL_COMMUNITY): Payer: Self-pay | Admitting: Nurse Practitioner

## 2021-07-22 DIAGNOSIS — G4733 Obstructive sleep apnea (adult) (pediatric): Secondary | ICD-10-CM | POA: Diagnosis not present

## 2021-07-23 ENCOUNTER — Ambulatory Visit (HOSPITAL_COMMUNITY)
Admission: RE | Admit: 2021-07-23 | Discharge: 2021-07-23 | Disposition: A | Discharge status: Home / Self Care | Payer: Medicare HMO | Source: Ambulatory Visit | Attending: Internal Medicine | Admitting: Internal Medicine

## 2021-07-23 DIAGNOSIS — I4819 Other persistent atrial fibrillation: Secondary | ICD-10-CM

## 2021-07-23 LAB — ECHOCARDIOGRAM COMPLETE
Area-P 1/2: 3.65 cm2
Calc EF: 50.4 %
S' Lateral: 4.15 cm
Single Plane A2C EF: 53.1 %
Single Plane A4C EF: 47 %

## 2021-07-23 NOTE — Progress Notes (Signed)
*  PRELIMINARY RESULTS* Echocardiogram 2D Echocardiogram has been performed.  Samuel Germany 07/23/2021, 12:56 PM

## 2021-07-24 ENCOUNTER — Encounter (HOSPITAL_COMMUNITY): Payer: Self-pay | Admitting: *Deleted

## 2021-07-26 DIAGNOSIS — N1831 Chronic kidney disease, stage 3a: Secondary | ICD-10-CM | POA: Diagnosis not present

## 2021-07-26 DIAGNOSIS — I480 Paroxysmal atrial fibrillation: Secondary | ICD-10-CM | POA: Diagnosis not present

## 2021-07-26 DIAGNOSIS — I5022 Chronic systolic (congestive) heart failure: Secondary | ICD-10-CM | POA: Diagnosis not present

## 2021-07-26 DIAGNOSIS — Z79899 Other long term (current) drug therapy: Secondary | ICD-10-CM | POA: Diagnosis not present

## 2021-07-26 DIAGNOSIS — I10 Essential (primary) hypertension: Secondary | ICD-10-CM | POA: Diagnosis not present

## 2021-08-02 DIAGNOSIS — I480 Paroxysmal atrial fibrillation: Secondary | ICD-10-CM | POA: Diagnosis not present

## 2021-08-02 DIAGNOSIS — I5022 Chronic systolic (congestive) heart failure: Secondary | ICD-10-CM | POA: Diagnosis not present

## 2021-08-02 DIAGNOSIS — D6869 Other thrombophilia: Secondary | ICD-10-CM | POA: Diagnosis not present

## 2021-08-02 DIAGNOSIS — N1832 Chronic kidney disease, stage 3b: Secondary | ICD-10-CM | POA: Diagnosis not present

## 2021-08-07 ENCOUNTER — Telehealth (HOSPITAL_COMMUNITY): Payer: Self-pay | Admitting: *Deleted

## 2021-08-07 MED ORDER — DILTIAZEM HCL ER COATED BEADS 120 MG PO CP24: 120.0000 mg | ORAL_CAPSULE | Freq: Every day | ORAL | 3 refills | Status: DC

## 2021-08-07 NOTE — Telephone Encounter (Signed)
Patient states he continues feeling tired with no energy and dizziness since cardioversion. HRs running 40-60 on his Chad. Discussed with Roderic Palau NP will decrease cardizem to '120mg'$  once a day. He has follow up with Dr. Marlou Porch in a few weeks to follow up. Pt in agreement. ?

## 2021-08-19 DIAGNOSIS — G4733 Obstructive sleep apnea (adult) (pediatric): Secondary | ICD-10-CM | POA: Diagnosis not present

## 2021-08-21 DIAGNOSIS — G4733 Obstructive sleep apnea (adult) (pediatric): Secondary | ICD-10-CM | POA: Diagnosis not present

## 2021-08-23 NOTE — Progress Notes (Signed)
? ?Subjective:  ? ? Patient ID: Caleb Wolf, male    DOB: [birth year], 81 y.o.   MRN: [id number] ? ?HPI   ?Male former smoker followed for OSA, complicated by HBP, CAD/MI/CABG, carotid stenosis, osteoarthritis hip, GERD ?NPSG 07/2012  AHI 15/ hr, CPAP to 10 ?================================================================================== ? ? ?4776- 81 year old male former smoker followed for OSA, complicated by HBP, CAD/MI/CABG/AFib/ Xarelto, carotid stenosis, osteoarthritis hip, GERD ?CPAP auto 5-15/Adapt ?Download-compliance 90%, AHI 1.8/ hr     AirSense 10 AutoSet ?Body weight today-233 lbs ?Covid vax-3 Moderna ?Flu vax-had ?Doing well, using CPAP ?Sleeps better with CPAP. Download reviewed. ? ?08/26/21- 81 year old male former smoker followed for OSA, complicated by HBP, CAD/MI/CABG/AFib/ Xarelto, carotid stenosis, osteoarthritis hip, GERD ?CPAP auto 5-20/Adapt ?Download-compliance 93%, AHI 10.1/ hr ?Body weight today-237 lbs ?Covid vax-3 Moderna ?Flu vax-had ?-----Patient is doing good, no concerns. ?Download reviewed. Hose disconnects easily, but ok. He will discuss with DME. ?Cardiology follows for AFib- currently NSR. ? ? ?ROS-see HPI + = positive ?Constitutional:    weight loss, night sweats, fevers, chills, fatigue, lassitude. ?HEENT:    headaches, difficulty swallowing, tooth/dental problems, sore throat,  ?     sneezing, itching, ear ache, nasal congestion, post nasal drip, snoring ?CV:    chest pain, orthopnea, PND, swelling in lower extremities, anasarca,                                               dizziness, palpitations ?Resp:   +shortness of breath with exertion or at rest.   ?             productive cough,   non-productive cough, coughing up of blood.   ?           change in color of mucus.  wheezing.   ?Skin:    rash or lesions. ?GI:  No-   heartburn, indigestion, abdominal pain, nausea, vomiting, diarrhea,  ?               change in bowel habits, loss of appetite ?GU: dysuria, change in color of  urine, no urgency or frequency.   flank pain. ?MS:   joint pain, stiffness, decreased range of motion, back pain. ?Neuro-     nothing unusual ?Psych:  change in mood or affect.  depression or anxiety.   memory loss. ?   ?Objective:  ?OBJ- Physical Exam ?General- Alert, Oriented, Affect-appropriate, Distress- none acute, + overweight ?Skin- rash-none, lesions- none, excoriation- none ?Lymphadenopathy- none ?Head- atraumatic ?           Eyes- Gross vision intact, PERRLA, conjunctivae and secretions clear ?           Ears- Hearing, canals-normal ?           Nose- Clear, no-Septal dev, mucus, polyps, erosion, perforation  ?           Throat- Mallampati III-IV, mucosa clear , drainage- none, tonsils- atrophic ?Neck- flexible , trachea midline, no stridor , thyroid nl, carotid no bruit ?Chest - symmetrical excursion , unlabored ?          Heart/CV- RR+today , no murmur , no gallop  , no rub, nl s1 s2 ?                          - JVD- none , edema- none,  stasis changes- none, varices- none ?          Lung- clear to P&A, wheeze- none, cough- none , dullness-none, rub- none ?          Chest wall-  ?Abd-  ?Br/ Gen/ Rectal- Not done, not indicated ?Extrem- cyanosis- none, clubbing, none, atrophy- none, strength- nl ?Neuro- grossly intact to observation ?   ? ?

## 2021-08-26 ENCOUNTER — Encounter: Payer: Self-pay | Admitting: Internal Medicine

## 2021-08-26 ENCOUNTER — Other Ambulatory Visit: Payer: Self-pay

## 2021-08-26 ENCOUNTER — Ambulatory Visit: Payer: Medicare HMO | Admitting: Internal Medicine

## 2021-08-26 DIAGNOSIS — G4733 Obstructive sleep apnea (adult) (pediatric): Secondary | ICD-10-CM | POA: Diagnosis not present

## 2021-08-26 DIAGNOSIS — I480 Paroxysmal atrial fibrillation: Secondary | ICD-10-CM

## 2021-08-27 ENCOUNTER — Encounter: Payer: Self-pay | Admitting: Cardiology

## 2021-08-27 ENCOUNTER — Ambulatory Visit: Payer: Medicare HMO | Admitting: Cardiology

## 2021-08-27 DIAGNOSIS — E7800 Pure hypercholesterolemia, unspecified: Secondary | ICD-10-CM | POA: Diagnosis not present

## 2021-08-27 DIAGNOSIS — I4819 Other persistent atrial fibrillation: Secondary | ICD-10-CM

## 2021-08-27 DIAGNOSIS — I4891 Unspecified atrial fibrillation: Secondary | ICD-10-CM | POA: Diagnosis not present

## 2021-08-27 DIAGNOSIS — M48061 Spinal stenosis, lumbar region without neurogenic claudication: Secondary | ICD-10-CM

## 2021-08-27 DIAGNOSIS — G4733 Obstructive sleep apnea (adult) (pediatric): Secondary | ICD-10-CM

## 2021-08-27 DIAGNOSIS — Z951 Presence of aortocoronary bypass graft: Secondary | ICD-10-CM

## 2021-08-27 DIAGNOSIS — L570 Actinic keratosis: Secondary | ICD-10-CM | POA: Diagnosis not present

## 2021-08-27 NOTE — Assessment & Plan Note (Signed)
He discussed this with me today.  Per primary team.  Encourage activity. ?

## 2021-08-27 NOTE — Assessment & Plan Note (Signed)
Prior bypass surgery 2015.  No anginal symptoms. ?

## 2021-08-27 NOTE — Assessment & Plan Note (Signed)
Continue with atorvastatin 80 mg once a day.  LDL goal less than 70. ?

## 2021-08-27 NOTE — Patient Instructions (Addendum)
Follow-Up: ?Follow up with Bernerd Pho, PA-C in 6 months. ? ?Any Other Special Instructions Will Be Listed Below (If Applicable). ? ? ? ? ?If you need a refill on your cardiac medications before your next appointment, please call your pharmacy. ? ? ?Xarelto 20 mg tablets (Sample) #7 ?Lot #: [id number] ?Exp: 10/24 ?

## 2021-08-27 NOTE — Assessment & Plan Note (Signed)
Prior ablation x2 [name].  Recent cardioversion.  Doing well.  Amiodarone 200 mg a day.  We will continue.  Trying to maintain rhythm strategy. ?

## 2021-08-27 NOTE — Assessment & Plan Note (Signed)
Dr. Annamaria Boots has been managing.  Looking into portable CPAP for airplane. ?

## 2021-08-27 NOTE — Assessment & Plan Note (Addendum)
Amiodarone '200mg'$  daily. [place] ablation x 2. On Diltiazem '120mg'$ . 30 PRN. Feels better now. Did not feel as well with 240 of dilt.  Continue to monitor lab work for amiodarone, last TSH normal.  LFT normal ?

## 2021-08-27 NOTE — Progress Notes (Signed)
?Cardiology Office Note:   ? ?Date:  08/27/2021  ? ?ID:  [name], DOB 05-26-1941, MRN [id number] ? ?PCP:  Asencion Noble, MD ?  ?[facility] Providers ?Cardiologist:  None ?Electrophysiologist:  Thompson Grayer, MD    ? ?Referring MD: Asencion Noble, MD  ? ? ?History of Present Illness:   ? ?Caleb Wolf is a 81 y.o. male here for follow-up of atrial fibrillation.  Successfully cardioverted 06/17/2021.  On amiodarone 200 mg a day.  Energy remains low despite normal rhythm.  Kerby Less notes reviewed. ? ?He is on Cardizem 240 mg daily and 30 as needed.  On Xarelto 20 mg with CHA2DS2-VASc or of 6.  Creatinine remains 1.92.  Hemoglobin 11.4 stable.  Echocardiogram on 07/23/2021 shows EF of 50 to 55% with severely dilated atria. ? ?They are celebrating their 52nd wedding anniversary.  They are planning a trip to [place].  Overall feels well, much better since his diltiazem was decreased to 120. ? ?Past Medical History:  ?Diagnosis Date  ? AAA (abdominal aortic aneurysm)   ? Arthritis   ? Basal cell carcinoma   ? Carotid stenosis   ? Coronary artery disease   ? Multvessel s/p CABG 2015  ? Enlarged prostate   ? Essential hypertension   ? GERD (gastroesophageal reflux disease)   ? Gout   ? History of colon polyps   ? History of kidney stones   ? Hyperlipidemia   ? Lumbar disc disease   ? Persistent atrial fibrillation ([place])   ? Sleep apnea   ? CPAP  ? ? ?Past Surgical History:  ?Procedure Laterality Date  ? ABLATION OF DYSRHYTHMIC FOCUS  07/28/2017  ? ATRIAL FIBRILLATION ABLATION N/A 07/28/2017  ? Procedure: ATRIAL FIBRILLATION ABLATION;  Surgeon: Thompson Grayer, MD;  Location: [place] [facility];  Service: Cardiovascular;  Laterality: N/A;  ? ATRIAL FIBRILLATION ABLATION N/A 12/04/2020  ? Procedure: ATRIAL FIBRILLATION ABLATION;  Surgeon: Thompson Grayer, MD;  Location: [place] [facility];  Service: Cardiovascular;  Laterality: N/A;  ? BACK SURGERY    ? CARDIAC CATHETERIZATION  05/22/2014  ? Procedure: IABP INSERTION;  Surgeon:  Leonie Man, MD;  Location: [facility];  Service: Cardiovascular;;  ? CARDIOVERSION N/A 04/29/2017  ? Procedure: CARDIOVERSION;  Surgeon: Satira Sark, MD;  [facility];  Service: Cardiovascular;  Laterality: N/A;  ? CARDIOVERSION N/A 08/13/2017  ? Procedure: CARDIOVERSION;  Surgeon: Pixie Casino, MD;  Location: [place];  Service: Cardiovascular;  Laterality: N/A;  ? CARDIOVERSION N/A 08/15/2019  ? Procedure: CARDIOVERSION;  Surgeon: Dorothy Spark, MD;  Location: [facility];  Service: Cardiovascular;  Laterality: N/A;  ? CARDIOVERSION N/A 03/09/2020  ? Procedure: CARDIOVERSION;  Surgeon: Buford Dresser, MD;  Location: [place];  Service: Cardiovascular;  Laterality: N/A;  ? CARDIOVERSION N/A 07/06/2020  ? Procedure: CARDIOVERSION;  Surgeon: Sanda Klein, MD;  Location: [place];  Service: Cardiovascular;  Laterality: N/A;  ? CARDIOVERSION N/A 01/18/2021  ? Procedure: CARDIOVERSION;  Surgeon: Skeet Latch, MD;  Location: [place];  Service: Cardiovascular;  Laterality: N/A;  ? CARDIOVERSION N/A 06/17/2021  ? Procedure: CARDIOVERSION;  Surgeon: Geralynn Rile, MD;  Location: [place];  Service: Cardiovascular;  Laterality: N/A;  ? Cataract surgery Right   ? COLONOSCOPY    ? COLONOSCOPY N/A 12/30/2017  ? Procedure: COLONOSCOPY;  Surgeon: Rogene Houston, MD;  [facility];  Service: Endoscopy;  Laterality: N/A;  ? CORONARY ARTERY BYPASS GRAFT N/A 05/22/2014  ? Procedure:  CORONARY ARTERY BYPASS GRAFTING (CABG) times three using left internal mammary and right saphenous vein.;  Surgeon: Melrose Nakayama, MD;  Location: [place];  Service: Open Heart Surgery;  Laterality: N/A;  ? ENDARTERECTOMY Left 02/17/2014  ? Procedure: ENDARTERECTOMY CAROTID WITH PATCH ANGIOPLASTY;  Surgeon: Mal Misty, MD;  Location: [place];  Service: Vascular;  Laterality: Left;  ? ESOPHAGEAL DILATION N/A 08/22/2015  ? Procedure: ESOPHAGEAL DILATION;  Surgeon:  Rogene Houston, MD;  [facility];  Service: Endoscopy;  Laterality: N/A;  ? ESOPHAGOGASTRODUODENOSCOPY N/A 08/22/2015  ? Procedure: ESOPHAGOGASTRODUODENOSCOPY (EGD);  Surgeon: Rogene Houston, MD;  [facility];  Service: Endoscopy;  Laterality: N/A;  12:45 - moved to 1:55 - Ann notified pt  ? ESOPHAGOGASTRODUODENOSCOPY N/A 12/30/2017  ? Procedure: ESOPHAGOGASTRODUODENOSCOPY (EGD);  Surgeon: Rogene Houston, MD;  [facility];  Service: Endoscopy;  Laterality: N/A;  200  ? EYE SURGERY    ? cataract extraction (right) with repair macular tear , with IOL     right  ? FRACTURE SURGERY    ? bilateral wrist fractures- one ORIF  ? JOINT REPLACEMENT  2011  ? left knee  ? LEFT HEART CATHETERIZATION WITH CORONARY ANGIOGRAM N/A 05/22/2014  ? Procedure: LEFT HEART CATHETERIZATION WITH CORONARY ANGIOGRAM;  Surgeon: Leonie Man, MD;  Location: [facility] C[facility] LAB;  Service: Cardiovascular;  Laterality: N/A;  ? LITHOTRIPSY    ? PARS PLANA VITRECTOMY W/ REPAIR OF MACULAR HOLE    ? RHINOPLASTY    ? TEE WITHOUT CARDIOVERSION N/A 04/29/2017  ? Procedure: TRANSESOPHAGEAL ECHOCARDIOGRAM (TEE) WITH PROPOFOL;  Surgeon: Satira Sark, MD;  [facility];  Service: Cardiovascular;  Laterality: N/A;  ? TONSILLECTOMY    ? TOTAL HIP ARTHROPLASTY  12/08/2011  ? Procedure: TOTAL HIP ARTHROPLASTY;  Surgeon: Gearlean Alf, MD;  Location: [facility];  Service: Orthopedics;  Laterality: Right;  ? UPPER GI ENDOSCOPY  12/18/2015  ? Procedure: UPPER GI ENDOSCOPY;  Surgeon: Ralene Ok, MD;  Location: [facility];  Service: General;;  ? WRIST SURGERY Right 37yr ago  ? WRIST SURGERY Left   ? ? ?Current Medications: ?Current Meds  ?Medication Sig  ? acetaminophen (TYLENOL) 500 MG tablet Take 1,000 mg by mouth every 6 (six) hours as needed for moderate pain.  ? amiodarone (PACERONE) 200 MG tablet Take 1 tablet (200 mg total) by mouth daily.  ? amoxicillin (AMOXIL) 500 MG capsule Take 2,000 mg by mouth See admin  instructions. Take 4 capsules (2000 mg) by mouth before dental procedures  ? atorvastatin (LIPITOR) 80 MG tablet TAKE 1 TABLET BY MOUTH EVERY EVENING  ? colchicine 0.6 MG tablet Take 0.6 mg by mouth daily as needed (for gout flare up).  ? Cyanocobalamin (B-12) 1000 MCG SUBL Place 1,000 mcg under the tongue in the morning.  ? cyclobenzaprine (FLEXERIL) 10 MG tablet Take 10 mg by mouth 2 (two) times daily as needed for muscle spasms.  ? diltiazem (CARDIZEM CD) 120 MG 24 hr capsule Take 1 capsule (120 mg total) by mouth daily.  ? diltiazem (CARDIZEM) 30 MG tablet Take 1 tablet every 4 hours AS NEEDED for heart rate >100  ? doxazosin (CARDURA) 4 MG tablet Take 4 mg by mouth in the morning.  ? finasteride (PROSCAR) 5 MG tablet Take 5 mg by mouth every evening.   ? fluticasone (FLONASE) 50 MCG/ACT nasal spray Place 1 spray into both nostrils daily as needed for allergies or rhinitis.  ? furosemide (LASIX)  40 MG tablet TAKE 1 TABLET BY MOUTH TWICE A DAY (Patient taking differently: Take 40 mg by mouth daily.)  ? lisinopril (PRINIVIL,ZESTRIL) 40 MG tablet Take 40 mg by mouth in the morning.  ? nitroGLYCERIN (NITROSTAT) 0.4 MG SL tablet Place 1 tablet (0.4 mg total) under the tongue every 5 (five) minutes x 3 doses as needed for chest pain.  ? pantoprazole (PROTONIX) 40 MG tablet Take 1 tablet (40 mg total) by mouth daily before breakfast.  ? rivaroxaban (XARELTO) 20 MG TABS tablet Take 1 tablet (20 mg total) by mouth daily with supper.  ?  ? ?Allergies:   Codeine sulfate  ? ?Social History  ? ?Socioeconomic History  ? Marital status: Married  ?  Spouse name: Not on file  ? Number of children: Not on file  ? Years of education: Not on file  ? Highest education level: Not on file  ?Occupational History  ? Occupation: retired  ?  Comment: [employer]  ?Tobacco Use  ? Smoking status: Former  ?  Packs/day: 1.50  ?  Years: 25.00  ?  Pack years: 37.50  ?  Types: Cigarettes  ?  Start date: 08/08/1960  ?  Quit date:  06/02/1982  ?  Years since quitting: 39.2  ? Smokeless tobacco: Never  ? Tobacco comments:  ?  quit smoking 30+yrs ago  ?Vaping Use  ? Vaping Use: Never used  ?Substance and Sexual Activity  ? Alcohol use: No  ?  Alcoho

## 2021-09-09 DIAGNOSIS — I2510 Atherosclerotic heart disease of native coronary artery without angina pectoris: Secondary | ICD-10-CM | POA: Diagnosis not present

## 2021-09-09 DIAGNOSIS — M1990 Unspecified osteoarthritis, unspecified site: Secondary | ICD-10-CM | POA: Diagnosis not present

## 2021-09-09 DIAGNOSIS — I5022 Chronic systolic (congestive) heart failure: Secondary | ICD-10-CM | POA: Diagnosis not present

## 2021-09-09 DIAGNOSIS — E785 Hyperlipidemia, unspecified: Secondary | ICD-10-CM | POA: Diagnosis not present

## 2021-09-09 DIAGNOSIS — M109 Gout, unspecified: Secondary | ICD-10-CM | POA: Diagnosis not present

## 2021-09-09 DIAGNOSIS — G4733 Obstructive sleep apnea (adult) (pediatric): Secondary | ICD-10-CM | POA: Diagnosis not present

## 2021-09-09 DIAGNOSIS — I480 Paroxysmal atrial fibrillation: Secondary | ICD-10-CM | POA: Diagnosis not present

## 2021-09-09 DIAGNOSIS — N1832 Chronic kidney disease, stage 3b: Secondary | ICD-10-CM | POA: Diagnosis not present

## 2021-09-09 DIAGNOSIS — I10 Essential (primary) hypertension: Secondary | ICD-10-CM | POA: Diagnosis not present

## 2021-09-09 DIAGNOSIS — Z79899 Other long term (current) drug therapy: Secondary | ICD-10-CM | POA: Diagnosis not present

## 2021-09-09 DIAGNOSIS — I700 Atherosclerosis of aorta: Secondary | ICD-10-CM | POA: Diagnosis not present

## 2021-09-09 DIAGNOSIS — D6869 Other thrombophilia: Secondary | ICD-10-CM | POA: Diagnosis not present

## 2021-09-13 DIAGNOSIS — N1832 Chronic kidney disease, stage 3b: Secondary | ICD-10-CM | POA: Diagnosis not present

## 2021-09-13 DIAGNOSIS — I700 Atherosclerosis of aorta: Secondary | ICD-10-CM | POA: Diagnosis not present

## 2021-09-13 DIAGNOSIS — I5022 Chronic systolic (congestive) heart failure: Secondary | ICD-10-CM | POA: Diagnosis not present

## 2021-09-13 DIAGNOSIS — I480 Paroxysmal atrial fibrillation: Secondary | ICD-10-CM | POA: Diagnosis not present

## 2021-09-13 DIAGNOSIS — I10 Essential (primary) hypertension: Secondary | ICD-10-CM | POA: Diagnosis not present

## 2021-09-13 DIAGNOSIS — E785 Hyperlipidemia, unspecified: Secondary | ICD-10-CM | POA: Diagnosis not present

## 2021-09-13 DIAGNOSIS — I2510 Atherosclerotic heart disease of native coronary artery without angina pectoris: Secondary | ICD-10-CM | POA: Diagnosis not present

## 2021-09-19 DIAGNOSIS — G4733 Obstructive sleep apnea (adult) (pediatric): Secondary | ICD-10-CM | POA: Diagnosis not present

## 2021-09-21 DIAGNOSIS — G4733 Obstructive sleep apnea (adult) (pediatric): Secondary | ICD-10-CM | POA: Diagnosis not present

## 2021-09-23 NOTE — Patient Instructions (Signed)
We can continue CPAP auto 5-15. ?No changes needed. ?

## 2021-09-23 NOTE — Assessment & Plan Note (Signed)
Followed by cardiology. Exam c/w NSR now. ?

## 2021-09-23 NOTE — Assessment & Plan Note (Signed)
Benefits from CPAP with good compliance and control Plan- continue auto 5-15 

## 2021-10-09 ENCOUNTER — Encounter (INDEPENDENT_AMBULATORY_CARE_PROVIDER_SITE_OTHER): Payer: Self-pay

## 2021-10-09 DIAGNOSIS — H04123 Dry eye syndrome of bilateral lacrimal glands: Secondary | ICD-10-CM | POA: Diagnosis not present

## 2021-10-09 DIAGNOSIS — H35372 Puckering of macula, left eye: Secondary | ICD-10-CM | POA: Diagnosis not present

## 2021-10-09 DIAGNOSIS — H0102B Squamous blepharitis left eye, upper and lower eyelids: Secondary | ICD-10-CM | POA: Diagnosis not present

## 2021-10-09 DIAGNOSIS — H0102A Squamous blepharitis right eye, upper and lower eyelids: Secondary | ICD-10-CM | POA: Diagnosis not present

## 2021-10-09 DIAGNOSIS — Z961 Presence of intraocular lens: Secondary | ICD-10-CM | POA: Diagnosis not present

## 2021-10-09 DIAGNOSIS — H43812 Vitreous degeneration, left eye: Secondary | ICD-10-CM | POA: Diagnosis not present

## 2021-10-21 ENCOUNTER — Other Ambulatory Visit: Payer: Self-pay | Admitting: Internal Medicine

## 2021-11-02 ENCOUNTER — Other Ambulatory Visit (HOSPITAL_COMMUNITY): Payer: Self-pay | Admitting: Nurse Practitioner

## 2021-11-07 DIAGNOSIS — N312 Flaccid neuropathic bladder, not elsewhere classified: Secondary | ICD-10-CM | POA: Diagnosis not present

## 2021-11-07 DIAGNOSIS — N529 Male erectile dysfunction, unspecified: Secondary | ICD-10-CM | POA: Diagnosis not present

## 2021-11-07 DIAGNOSIS — N401 Enlarged prostate with lower urinary tract symptoms: Secondary | ICD-10-CM | POA: Diagnosis not present

## 2021-11-07 DIAGNOSIS — N200 Calculus of kidney: Secondary | ICD-10-CM | POA: Diagnosis not present

## 2021-12-09 ENCOUNTER — Other Ambulatory Visit (HOSPITAL_COMMUNITY): Payer: Self-pay | Admitting: Internal Medicine

## 2021-12-09 ENCOUNTER — Ambulatory Visit (HOSPITAL_COMMUNITY)
Admission: RE | Admit: 2021-12-09 | Discharge: 2021-12-09 | Disposition: A | Discharge status: Home / Self Care | Payer: Medicare HMO | Source: Ambulatory Visit | Attending: Internal Medicine | Admitting: Internal Medicine

## 2021-12-09 DIAGNOSIS — R0602 Shortness of breath: Secondary | ICD-10-CM | POA: Insufficient documentation

## 2021-12-09 DIAGNOSIS — I130 Hypertensive heart and chronic kidney disease with heart failure and stage 1 through stage 4 chronic kidney disease, or unspecified chronic kidney disease: Secondary | ICD-10-CM | POA: Diagnosis not present

## 2021-12-09 DIAGNOSIS — J90 Pleural effusion, not elsewhere classified: Secondary | ICD-10-CM | POA: Diagnosis not present

## 2021-12-10 ENCOUNTER — Emergency Department (HOSPITAL_COMMUNITY): Payer: Medicare HMO

## 2021-12-10 ENCOUNTER — Inpatient Hospital Stay (HOSPITAL_COMMUNITY)
Admission: EM | Admit: 2021-12-10 | Discharge: 2021-12-16 | DRG: 291 | Disposition: A | Discharge status: Home / Self Care | Payer: Medicare HMO | Attending: Internal Medicine | Admitting: Internal Medicine

## 2021-12-10 ENCOUNTER — Other Ambulatory Visit: Payer: Self-pay

## 2021-12-10 ENCOUNTER — Encounter (HOSPITAL_COMMUNITY): Payer: Self-pay | Admitting: Emergency Medicine

## 2021-12-10 DIAGNOSIS — Z87891 Personal history of nicotine dependence: Secondary | ICD-10-CM

## 2021-12-10 DIAGNOSIS — N400 Enlarged prostate without lower urinary tract symptoms: Secondary | ICD-10-CM | POA: Diagnosis present

## 2021-12-10 DIAGNOSIS — Z6833 Body mass index (BMI) 33.0-33.9, adult: Secondary | ICD-10-CM

## 2021-12-10 DIAGNOSIS — K219 Gastro-esophageal reflux disease without esophagitis: Secondary | ICD-10-CM | POA: Diagnosis not present

## 2021-12-10 DIAGNOSIS — I480 Paroxysmal atrial fibrillation: Secondary | ICD-10-CM | POA: Diagnosis present

## 2021-12-10 DIAGNOSIS — Z20822 Contact with and (suspected) exposure to covid-19: Secondary | ICD-10-CM | POA: Diagnosis present

## 2021-12-10 DIAGNOSIS — Z96652 Presence of left artificial knee joint: Secondary | ICD-10-CM | POA: Diagnosis not present

## 2021-12-10 DIAGNOSIS — I10 Essential (primary) hypertension: Secondary | ICD-10-CM | POA: Diagnosis present

## 2021-12-10 DIAGNOSIS — J9611 Chronic respiratory failure with hypoxia: Secondary | ICD-10-CM

## 2021-12-10 DIAGNOSIS — I4819 Other persistent atrial fibrillation: Secondary | ICD-10-CM | POA: Diagnosis present

## 2021-12-10 DIAGNOSIS — D631 Anemia in chronic kidney disease: Secondary | ICD-10-CM | POA: Diagnosis not present

## 2021-12-10 DIAGNOSIS — J189 Pneumonia, unspecified organism: Secondary | ICD-10-CM | POA: Diagnosis not present

## 2021-12-10 DIAGNOSIS — Z96641 Presence of right artificial hip joint: Secondary | ICD-10-CM | POA: Diagnosis present

## 2021-12-10 DIAGNOSIS — I5031 Acute diastolic (congestive) heart failure: Secondary | ICD-10-CM | POA: Diagnosis not present

## 2021-12-10 DIAGNOSIS — I509 Heart failure, unspecified: Secondary | ICD-10-CM | POA: Diagnosis not present

## 2021-12-10 DIAGNOSIS — I130 Hypertensive heart and chronic kidney disease with heart failure and stage 1 through stage 4 chronic kidney disease, or unspecified chronic kidney disease: Secondary | ICD-10-CM | POA: Diagnosis not present

## 2021-12-10 DIAGNOSIS — J8489 Other specified interstitial pulmonary diseases: Secondary | ICD-10-CM | POA: Diagnosis present

## 2021-12-10 DIAGNOSIS — R0602 Shortness of breath: Secondary | ICD-10-CM | POA: Diagnosis not present

## 2021-12-10 DIAGNOSIS — I5022 Chronic systolic (congestive) heart failure: Secondary | ICD-10-CM | POA: Diagnosis not present

## 2021-12-10 DIAGNOSIS — I420 Dilated cardiomyopathy: Secondary | ICD-10-CM | POA: Diagnosis not present

## 2021-12-10 DIAGNOSIS — I5041 Acute combined systolic (congestive) and diastolic (congestive) heart failure: Secondary | ICD-10-CM | POA: Diagnosis not present

## 2021-12-10 DIAGNOSIS — Z951 Presence of aortocoronary bypass graft: Secondary | ICD-10-CM

## 2021-12-10 DIAGNOSIS — Z85828 Personal history of other malignant neoplasm of skin: Secondary | ICD-10-CM | POA: Diagnosis not present

## 2021-12-10 DIAGNOSIS — Z885 Allergy status to narcotic agent status: Secondary | ICD-10-CM | POA: Diagnosis not present

## 2021-12-10 DIAGNOSIS — J90 Pleural effusion, not elsewhere classified: Secondary | ICD-10-CM | POA: Diagnosis not present

## 2021-12-10 DIAGNOSIS — G4733 Obstructive sleep apnea (adult) (pediatric): Secondary | ICD-10-CM | POA: Diagnosis not present

## 2021-12-10 DIAGNOSIS — I2510 Atherosclerotic heart disease of native coronary artery without angina pectoris: Secondary | ICD-10-CM | POA: Diagnosis not present

## 2021-12-10 DIAGNOSIS — J9601 Acute respiratory failure with hypoxia: Secondary | ICD-10-CM | POA: Diagnosis not present

## 2021-12-10 DIAGNOSIS — Z91148 Patient's other noncompliance with medication regimen for other reason: Secondary | ICD-10-CM

## 2021-12-10 DIAGNOSIS — E669 Obesity, unspecified: Secondary | ICD-10-CM | POA: Diagnosis present

## 2021-12-10 DIAGNOSIS — Z961 Presence of intraocular lens: Secondary | ICD-10-CM | POA: Diagnosis present

## 2021-12-10 DIAGNOSIS — M7989 Other specified soft tissue disorders: Secondary | ICD-10-CM | POA: Diagnosis not present

## 2021-12-10 DIAGNOSIS — Z9841 Cataract extraction status, right eye: Secondary | ICD-10-CM

## 2021-12-10 DIAGNOSIS — Z8249 Family history of ischemic heart disease and other diseases of the circulatory system: Secondary | ICD-10-CM

## 2021-12-10 DIAGNOSIS — Z79899 Other long term (current) drug therapy: Secondary | ICD-10-CM

## 2021-12-10 DIAGNOSIS — R042 Hemoptysis: Secondary | ICD-10-CM

## 2021-12-10 DIAGNOSIS — J9811 Atelectasis: Secondary | ICD-10-CM | POA: Diagnosis not present

## 2021-12-10 DIAGNOSIS — N1832 Chronic kidney disease, stage 3b: Secondary | ICD-10-CM | POA: Diagnosis present

## 2021-12-10 DIAGNOSIS — Z7901 Long term (current) use of anticoagulants: Secondary | ICD-10-CM

## 2021-12-10 DIAGNOSIS — E785 Hyperlipidemia, unspecified: Secondary | ICD-10-CM | POA: Diagnosis present

## 2021-12-10 DIAGNOSIS — R0600 Dyspnea, unspecified: Secondary | ICD-10-CM | POA: Diagnosis not present

## 2021-12-10 DIAGNOSIS — R918 Other nonspecific abnormal finding of lung field: Secondary | ICD-10-CM | POA: Diagnosis not present

## 2021-12-10 DIAGNOSIS — I5021 Acute systolic (congestive) heart failure: Secondary | ICD-10-CM | POA: Diagnosis not present

## 2021-12-10 DIAGNOSIS — J168 Pneumonia due to other specified infectious organisms: Secondary | ICD-10-CM | POA: Diagnosis not present

## 2021-12-10 LAB — COMPREHENSIVE METABOLIC PANEL
ALT: 28 U/L (ref 0–44)
AST: 18 U/L (ref 15–41)
Albumin: 2.8 g/dL — ABNORMAL LOW (ref 3.5–5.0)
Alkaline Phosphatase: 95 U/L (ref 38–126)
Anion gap: 8 (ref 5–15)
BUN: 39 mg/dL — ABNORMAL HIGH (ref 8–23)
CO2: 25 mmol/L (ref 22–32)
Calcium: 8.3 mg/dL — ABNORMAL LOW (ref 8.9–10.3)
Chloride: 106 mmol/L (ref 98–111)
Creatinine, Ser: 1.99 mg/dL — ABNORMAL HIGH (ref 0.61–1.24)
GFR, Estimated: 34 mL/min — ABNORMAL LOW (ref >60)
Glucose, Bld: 111 mg/dL — ABNORMAL HIGH (ref 70–99)
Potassium: 4.2 mmol/L (ref 3.5–5.1)
Sodium: 139 mmol/L (ref 135–145)
Total Bilirubin: 1.2 mg/dL (ref 0.3–1.2)
Total Protein: 5.9 g/dL — ABNORMAL LOW (ref 6.5–8.1)

## 2021-12-10 LAB — CBC WITH DIFFERENTIAL/PLATELET
Abs Immature Granulocytes: 0.04 10*3/uL (ref 0.00–0.07)
Basophils Absolute: 0 10*3/uL (ref 0.0–0.1)
Basophils Relative: 0 %
Eosinophils Absolute: 0.2 10*3/uL (ref 0.0–0.5)
Eosinophils Relative: 2 %
HCT: 27.4 % — ABNORMAL LOW (ref 39.0–52.0)
Hemoglobin: 8.6 g/dL — ABNORMAL LOW (ref 13.0–17.0)
Immature Granulocytes: 1 %
Lymphocytes Relative: 7 %
Lymphs Abs: 0.6 10*3/uL — ABNORMAL LOW (ref 0.7–4.0)
MCH: 29.9 pg (ref 26.0–34.0)
MCHC: 31.4 g/dL (ref 30.0–36.0)
MCV: 95.1 fL (ref 80.0–100.0)
Monocytes Absolute: 0.8 10*3/uL (ref 0.1–1.0)
Monocytes Relative: 10 %
Neutro Abs: 6.7 10*3/uL (ref 1.7–7.7)
Neutrophils Relative %: 80 %
Platelets: 248 10*3/uL (ref 150–400)
RBC: 2.88 MIL/uL — ABNORMAL LOW (ref 4.22–5.81)
RDW: 14.8 % (ref 11.5–15.5)
WBC: 8.3 10*3/uL (ref 4.0–10.5)
nRBC: 0 % (ref 0.0–0.2)

## 2021-12-10 LAB — TROPONIN I (HIGH SENSITIVITY)
Troponin I (High Sensitivity): 24 ng/L — ABNORMAL HIGH (ref <18)
Troponin I (High Sensitivity): 25 ng/L — ABNORMAL HIGH (ref <18)

## 2021-12-10 LAB — BRAIN NATRIURETIC PEPTIDE: B Natriuretic Peptide: 521 pg/mL — ABNORMAL HIGH (ref 0.0–100.0)

## 2021-12-10 LAB — POC SARS CORONAVIRUS 2 AG -  ED: SARSCOV2ONAVIRUS 2 AG: NEGATIVE

## 2021-12-10 MED ORDER — LISINOPRIL 10 MG PO TABS
40.0000 mg | ORAL_TABLET | Freq: Every morning | ORAL | Status: DC
  Administered 2021-12-11 – 2021-12-12 (×2): 40 mg via ORAL
  Filled 2021-12-10 (×3): qty 4

## 2021-12-10 MED ORDER — DOXAZOSIN MESYLATE 2 MG PO TABS
4.0000 mg | ORAL_TABLET | Freq: Every morning | ORAL | Status: DC
  Administered 2021-12-11 – 2021-12-16 (×6): 4 mg via ORAL
  Filled 2021-12-10 (×7): qty 2

## 2021-12-10 MED ORDER — SODIUM CHLORIDE 0.9 % IV SOLN
1.0000 g | Freq: Once | INTRAVENOUS | Status: AC
  Administered 2021-12-10: 1 g via INTRAVENOUS
  Filled 2021-12-10: qty 10

## 2021-12-10 MED ORDER — SODIUM CHLORIDE 0.9 % IV SOLN
500.0000 mg | INTRAVENOUS | Status: DC
  Administered 2021-12-10: 500 mg via INTRAVENOUS
  Filled 2021-12-10: qty 5

## 2021-12-10 MED ORDER — PANTOPRAZOLE SODIUM 40 MG PO TBEC
40.0000 mg | DELAYED_RELEASE_TABLET | Freq: Every evening | ORAL | Status: DC
  Administered 2021-12-10 – 2021-12-15 (×6): 40 mg via ORAL
  Filled 2021-12-10 (×6): qty 1

## 2021-12-10 MED ORDER — DILTIAZEM HCL ER COATED BEADS 120 MG PO CP24
120.0000 mg | ORAL_CAPSULE | Freq: Every day | ORAL | Status: DC
  Administered 2021-12-11 – 2021-12-12 (×2): 120 mg via ORAL
  Filled 2021-12-10 (×2): qty 1

## 2021-12-10 MED ORDER — RIVAROXABAN 20 MG PO TABS: 20.0000 mg | ORAL_TABLET | Freq: Every day | ORAL | Status: DC

## 2021-12-10 MED ORDER — ATORVASTATIN CALCIUM 40 MG PO TABS
80.0000 mg | ORAL_TABLET | Freq: Every evening | ORAL | Status: DC
  Administered 2021-12-10 – 2021-12-15 (×6): 80 mg via ORAL
  Filled 2021-12-10 (×6): qty 2

## 2021-12-10 MED ORDER — FINASTERIDE 5 MG PO TABS
5.0000 mg | ORAL_TABLET | Freq: Every evening | ORAL | Status: DC
  Administered 2021-12-10 – 2021-12-15 (×6): 5 mg via ORAL
  Filled 2021-12-10 (×6): qty 1

## 2021-12-10 MED ORDER — IOHEXOL 350 MG/ML SOLN
75.0000 mL | Freq: Once | INTRAVENOUS | Status: AC | PRN
  Administered 2021-12-10: 75 mL via INTRAVENOUS

## 2021-12-10 MED ORDER — FUROSEMIDE 10 MG/ML IJ SOLN
40.0000 mg | Freq: Two times a day (BID) | INTRAMUSCULAR | Status: DC
Start: 2021-12-10 — End: 2021-12-12
  Administered 2021-12-10 – 2021-12-12 (×4): 40 mg via INTRAVENOUS
  Filled 2021-12-10 (×4): qty 4

## 2021-12-10 MED ORDER — AMIODARONE HCL 200 MG PO TABS
200.0000 mg | ORAL_TABLET | Freq: Every day | ORAL | Status: DC
  Administered 2021-12-11 – 2021-12-16 (×6): 200 mg via ORAL
  Filled 2021-12-10 (×6): qty 1

## 2021-12-10 MED ORDER — SODIUM CHLORIDE 0.9 % IV SOLN: 1.0000 g | INTRAVENOUS | Status: DC

## 2021-12-10 NOTE — Assessment & Plan Note (Addendum)
-  no Chest pain. -EKG has baseline wander but no acute ischemic changes -s/p CABG in 2015.He was having dyspnea prior to admission but denies any recent exertional chest pain. - Continue Toprol-XL '25mg'$  daily and Atorvastatin '80mg'$  daily. Not on ASA due to being on Xarelto.  -Xarelto was held temporarily due to hemoptysis>>resume

## 2021-12-10 NOTE — ED Notes (Signed)
Pt placed on 3 L initially and SpO2 went up to 92-93%. Pt is now on 4 L via [place] and Spo2 is now 98%

## 2021-12-10 NOTE — Assessment & Plan Note (Addendum)
-   No known history of CHF with last echo being in February -normal ejection fraction and indeterminate diastolic parameters -Update echo -Strict I's and O's -Daily weights -Lasix -Continue statin, lisinopril -Likely triggered by fasting while on a cruise-high salt diet -Major criteria include orthopnea and cardiomegaly on CTA -Monitor criteria include dyspnea on exertion, pleural effusion -Monitor on telemetry

## 2021-12-10 NOTE — Assessment & Plan Note (Signed)
-   Multifocal pneumonia finding on CTA -Hypoxia requiring 3 L nasal cannula -No leukocytosis -Continue Rocephin and Zithromax -Continue to monitor

## 2021-12-10 NOTE — Assessment & Plan Note (Addendum)
-   Oxygen sats as low as 85% on room air -Requiring 2 L nasal cannula to maintain mid 90s -Secondary to CHF and BOOP -Wean off O2 as tolerated

## 2021-12-10 NOTE — Assessment & Plan Note (Addendum)
-   Continue lisinopril changed to losartan - cardizem transitioned to metoprolol succinate

## 2021-12-10 NOTE — ED Provider Notes (Signed)
[facility] Provider Note  CSN: [id number] Arrival date & time: 12/10/21 1643  Chief Complaint(s) Shortness of Breath  HPI Caleb Wolf is a 81 y.o. male with PMH of AAA, CAD status post CABG, HTN, CHF, persistent A-fib who presents emergency department for evaluation of shortness of breath and concern for pneumonia.  Patient was on a cruise ship last week and did not have any of his Lasix with him.  He has restarted his Lasix since but has been short of breath since being on the cruise ship.  He saw his primary care doctor yesterday who obtained a chest x-ray that is concerning for multifocal pneumonia and arrives 85% on room air.  Saturations improved when placed on 2 L nasal cannula.  Denies fever, chest pain, abdominal pain, nausea, vomiting or other systemic symptoms.   Past Medical History Past Medical History:  Diagnosis Date   AAA (abdominal aortic aneurysm) (HCC)    Arthritis    Basal cell carcinoma    Carotid stenosis    Coronary artery disease    Multvessel s/p CABG 2015   Enlarged prostate    Essential hypertension    GERD (gastroesophageal reflux disease)    Gout    History of colon polyps    History of kidney stones    Hyperlipidemia    Lumbar disc disease    Persistent atrial fibrillation (HCC)    Sleep apnea    CPAP   Patient Active Problem List   Diagnosis Date Noted   Pneumonia 12/10/2021   CAD (coronary artery disease) 12/10/2021   Acute respiratory failure with hypoxia ([place]) 12/10/2021   Posterior vitreous detachment of left eye 02/13/2020   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   Vitreous floaters, left 02/13/2020   History of vitrectomy 02/13/2020   Left epiretinal membrane 02/13/2020   HLD (hyperlipidemia) 12/07/2019   Low back pain 11/15/2019   Lumbar post-laminectomy syndrome 11/15/2019   Spinal stenosis of lumbar region 11/15/2019   Paroxysmal atrial fibrillation (HCC)    Pain in joint of right hip 06/09/2019   Trigger finger  04/08/2019   Special screening for malignant neoplasms, colon 11/25/2017   Absolute anemia 11/25/2017   Persistent atrial fibrillation ([place]) 07/28/2017   GERD (gastroesophageal reflux disease) 04/28/2017   Acute CHF ([place]) 04/28/2017   AKI (acute kidney injury) ([place]) 04/28/2017   Achalasia 12/18/2015   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   Encounter for therapeutic drug monitoring 05/31/2014   Atrial fibrillation with rapid ventricular response ([place]) 05/22/2014   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   Abnormal EKG    NSTEMI (non-ST elevated myocardial infarction) ([place])    Carotid artery stenosis without cerebral infarction    Benign non-nodular prostatic hyperplasia with lower urinary tract symptoms 03/27/2014   Chest pain 02/09/2014   HTN (hypertension) 02/09/2014   Carotid stenosis 08/02/2013   Peripheral edema 08/02/2013   Urge incontinence of urine 10/04/2012   OSA (obstructive sleep apnea) 09/21/2012   Recurrent nephrolithiasis 04/05/2012   Difficulty walking 01/29/2012   Balance disorder 01/29/2012   OA (osteoarthritis) of hip 12/08/2011   History of revision of total replacement of right hip joint 12/08/2011   ED (erectile dysfunction) of organic origin 04/07/2011   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 Authorizing Provider  acetaminophen (TYLENOL) 500 MG tablet Take 1,000 mg by mouth every 6 (six) hours as needed  for moderate pain.   Yes [provider]  amiodarone (PACERONE) 200 MG tablet Take 1 tablet (200 mg total) by mouth daily. 07/04/21  Yes Sherran Needs, NP  amoxicillin (AMOXIL) 500 MG capsule Take 2,000 mg by mouth See admin instructions. Take 4 capsules (2000 mg) by mouth before dental procedures 01/24/20  Yes [provider]  atorvastatin (LIPITOR) 80 MG tablet TAKE 1 TABLET BY MOUTH EVERY EVENING 04/15/21  Yes Allred, Jeneen Rinks,  MD  colchicine 0.6 MG tablet Take 0.6 mg by mouth daily as needed (for gout flare up).   Yes [provider]  diltiazem (CARDIZEM CD) 120 MG 24 hr capsule TAKE 1 CAPSULE BY MOUTH EVERY DAY 11/04/21  Yes Jerline Pain, MD  doxazosin (CARDURA) 4 MG tablet Take 4 mg by mouth in the morning. 07/06/17  Yes [provider]  finasteride (PROSCAR) 5 MG tablet Take 5 mg by mouth every evening.    Yes [provider]  fluticasone (FLONASE) 50 MCG/ACT nasal spray Place 1 spray into both nostrils daily as needed for allergies or rhinitis.   Yes [provider]  furosemide (LASIX) 40 MG tablet TAKE 1 TABLET BY MOUTH TWICE A DAY Patient taking differently: Take 40 mg by mouth daily. If needed can take an additional tablet 05/16/21  Yes Sherran Needs, NP  lisinopril (PRINIVIL,ZESTRIL) 40 MG tablet Take 40 mg by mouth in the morning.   Yes [provider]  nitroGLYCERIN (NITROSTAT) 0.4 MG SL tablet Place 1 tablet (0.4 mg total) under the tongue every 5 (five) minutes x 3 doses as needed for chest pain. 06/04/20  Yes Allred, Jeneen Rinks, MD  pantoprazole (PROTONIX) 40 MG tablet Take 1 tablet (40 mg total) by mouth daily before breakfast. Patient taking differently: Take 40 mg by mouth every evening. 02/19/16  Yes Rehman, Mechele Dawley, MD  furosemide (LASIX) 40 MG tablet Take 1 tablet by mouth 2 (two) times daily.    [provider]  rivaroxaban (XARELTO) 20 MG TABS tablet Take 1 tablet (20 mg total) by mouth daily with supper. Patient taking differently: Take 20 mg by mouth daily. 05/31/21   Sherran Needs, NP                                                                                                                                    Past Surgical History Past Surgical History:  Procedure Laterality Date   ABLATION OF DYSRHYTHMIC FOCUS  07/28/2017   ATRIAL FIBRILLATION ABLATION N/A 07/28/2017   Procedure: ATRIAL FIBRILLATION ABLATION;  Surgeon: Thompson Grayer, MD;   Location: [facility];  Service: Cardiovascular;  Laterality: N/A;   ATRIAL FIBRILLATION ABLATION N/A 12/04/2020   Procedure: ATRIAL FIBRILLATION ABLATION;  Surgeon: Thompson Grayer, MD;  Location: [facility];  Service: Cardiovascular;  Laterality: N/A;   BACK SURGERY     CARDIAC CATHETERIZATION  05/22/2014   Procedure: IABP INSERTION;  Surgeon: Leonie Man, MD;  Location: [facility] CATH LAB;  Service: Cardiovascular;;   CARDIOVERSION N/A 04/29/2017   Procedure: CARDIOVERSION;  Surgeon: Satira Sark, MD;  [facility];  Service: Cardiovascular;  Laterality: N/A;   CARDIOVERSION N/A 08/13/2017   Procedure: CARDIOVERSION;  Surgeon: Pixie Casino, MD;  Location: [place];  Service: Cardiovascular;  Laterality: N/A;   CARDIOVERSION N/A 08/15/2019   Procedure: CARDIOVERSION;  Surgeon: Dorothy Spark, MD;  Location: [facility] ENDOSCOPY;  Service: Cardiovascular;  Laterality: N/A;   CARDIOVERSION N/A 03/09/2020   Procedure: CARDIOVERSION;  Surgeon: Buford Dresser, MD;  Location: [place];  Service: Cardiovascular;  Laterality: N/A;   CARDIOVERSION N/A 07/06/2020   Procedure: CARDIOVERSION;  Surgeon: Sanda Klein, MD;  Location: [place];  Service: Cardiovascular;  Laterality: N/A;   CARDIOVERSION N/A 01/18/2021   Procedure: CARDIOVERSION;  Surgeon: Skeet Latch, MD;  Location: [facility] ENDOSCOPY;  Service: Cardiovascular;  Laterality: N/A;   CARDIOVERSION N/A 06/17/2021   Procedure: CARDIOVERSION;  Surgeon: Geralynn Rile, MD;  Location: [place];  Service: Cardiovascular;  Laterality: N/A;   Cataract surgery Right    COLONOSCOPY     COLONOSCOPY N/A 12/30/2017   Procedure: COLONOSCOPY;  Surgeon: Rogene Houston, MD;  [facility];  Service: Endoscopy;  Laterality: N/A;   CORONARY ARTERY BYPASS GRAFT N/A 05/22/2014   Procedure: CORONARY ARTERY BYPASS GRAFTING (CABG) times three using left internal mammary and right saphenous vein.;   Surgeon: Melrose Nakayama, MD;  Location: [place];  Service: Open Heart Surgery;  Laterality: N/A;   ENDARTERECTOMY Left 02/17/2014   Procedure: ENDARTERECTOMY CAROTID WITH PATCH ANGIOPLASTY;  Surgeon: Mal Misty, MD;  Location: [facility];  Service: Vascular;  Laterality: Left;   ESOPHAGEAL DILATION N/A 08/22/2015   Procedure: ESOPHAGEAL DILATION;  Surgeon: Rogene Houston, MD;  [facility];  Service: Endoscopy;  Laterality: N/A;   ESOPHAGOGASTRODUODENOSCOPY N/A 08/22/2015   Procedure: ESOPHAGOGASTRODUODENOSCOPY (EGD);  Surgeon: Rogene Houston, MD;  [facility];  Service: Endoscopy;  Laterality: N/A;  12:45 - moved to 1:55 - Ann notified pt   ESOPHAGOGASTRODUODENOSCOPY N/A 12/30/2017   Procedure: ESOPHAGOGASTRODUODENOSCOPY (EGD);  Surgeon: Rogene Houston, MD;  [facility];  Service: Endoscopy;  Laterality: N/A;  200   EYE SURGERY     cataract extraction (right) with repair macular tear , with IOL     right   FRACTURE SURGERY     bilateral wrist fractures- one ORIF   JOINT REPLACEMENT  2011   left knee   LEFT HEART CATHETERIZATION WITH CORONARY ANGIOGRAM N/A 05/22/2014   Procedure: LEFT HEART CATHETERIZATION WITH CORONARY ANGIOGRAM;  Surgeon: Leonie Man, MD;  Location[facility];  Service: Cardiovascular;  Laterality: N/A;   LITHOTRIPSY     PARS PLANA VITRECTOMY W/ REPAIR OF MACULAR HOLE     RHINOPLASTY     TEE WITHOUT CARDIOVERSION N/A 04/29/2017   Procedure: TRANSESOPHAGEAL ECHOCARDIOGRAM (TEE) WITH PROPOFOL;  Surgeon: Satira Sark, MD;  [facility];  Service: Cardiovascular;  Laterality: N/A;   TONSILLECTOMY     TOTAL HIP ARTHROPLASTY  12/08/2011   Procedure: TOTAL HIP ARTHROPLASTY;  Surgeon: Gearlean Alf, MD;  Location: [facility];  Service: Orthopedics;  Laterality: Right;   UPPER GI ENDOSCOPY  12/18/2015   Procedure: UPPER GI ENDOSCOPY;  Surgeon: Ralene Ok, MD;  Location: [facility];  Service: General;;   WRIST SURGERY Right  26yr ago   WRIST SURGERY Left  Family History Family History  Problem Relation Age of Onset   Allergies Mother    Heart disease Mother    Hypertension Mother    Heart disease Father        MI    Social History Social History   Tobacco Use   Smoking status: Former    Packs/day: 1.50    Years: 25.00    Total pack years: 37.50    Types: Cigarettes    Start date: 08/08/1960    Quit date: 06/02/1982    Years since quitting: 39.5   Smokeless tobacco: Never   Tobacco comments:    quit smoking 30+yrs ago  Vaping Use   Vaping Use: Never used  Substance Use Topics   Alcohol use: No    Alcohol/week: 0.0 standard drinks of alcohol    Comment: occasionally    Drug use: No   Allergies Codeine sulfate  Review of Systems Review of Systems  Respiratory:  Positive for cough and shortness of breath.    Physical Exam Vital Signs  I have reviewed the triage vital signs BP (!) 143/57 (BP Location: Left Arm)   Pulse 72   Temp 99.1 F (37.3 C)   Resp 18   Ht '5\' 9"'$  (1.753 m)   Wt 108.8 kg   SpO2 100%   BMI 35.42 kg/m   Physical Exam Constitutional:      General: He is not in acute distress.    Appearance: Normal appearance.  HENT:     Head: Normocephalic and atraumatic.     Nose: No congestion or rhinorrhea.  Eyes:     General:        Right eye: No discharge.        Left eye: No discharge.     Extraocular Movements: Extraocular movements intact.     Pupils: Pupils are equal, round, and reactive to light.  Cardiovascular:     Rate and Rhythm: Normal rate and regular rhythm.     Heart sounds: No murmur heard. Pulmonary:     Effort: No respiratory distress.     Breath sounds: Rales present. No wheezing.  Abdominal:     General: There is no distension.     Tenderness: There is no abdominal tenderness.  Musculoskeletal:        General: Normal range of motion.     Cervical back: Normal range of motion.  Skin:    General: Skin is warm and dry.  Neurological:      General: No focal deficit present.     Mental Status: He is alert.     ED Results and Treatments Labs (all labs ordered are listed, but only abnormal results are displayed) Labs Reviewed  COMPREHENSIVE METABOLIC PANEL - Abnormal; Notable for the following components:      Result Value   Glucose, Bld 111 (*)    BUN 39 (*)    Creatinine, Ser 1.99 (*)    Calcium 8.3 (*)    Total Protein 5.9 (*)    Albumin 2.8 (*)    GFR, Estimated 34 (*)    All other components within normal limits  CBC WITH DIFFERENTIAL/PLATELET - Abnormal; Notable for the following components:   RBC 2.88 (*)    Hemoglobin 8.6 (*)    HCT 27.4 (*)    Lymphs Abs 0.6 (*)    All other components within normal limits  BRAIN NATRIURETIC PEPTIDE - Abnormal; Notable for the following components:   B Natriuretic Peptide 521.0 (*)  All other components within normal limits  TROPONIN I (HIGH SENSITIVITY) - Abnormal; Notable for the following components:   Troponin I (High Sensitivity) 24 (*)    All other components within normal limits  TROPONIN I (HIGH SENSITIVITY) - Abnormal; Notable for the following components:   Troponin I (High Sensitivity) 25 (*)    All other components within normal limits  EXPECTORATED SPUTUM ASSESSMENT W GRAM STAIN, RFLX TO RESP C  HIV ANTIBODY (ROUTINE TESTING W REFLEX)  LEGIONELLA PNEUMOPHILA SEROGP 1 UR AG  STREP PNEUMONIAE URINARY ANTIGEN  CBC  COMPREHENSIVE METABOLIC PANEL  POC SARS CORONAVIRUS 2 AG -  ED                                                                                                                          Radiology CT Angio Chest PE W and/or Wo Contrast  Result Date: 12/10/2021 CLINICAL DATA:  Pulmonary embolism (PE) suspected, high prob. Shortness of breath. Cough. Chest pain. EXAM: CT ANGIOGRAPHY CHEST WITH CONTRAST TECHNIQUE: Multidetector CT imaging of the chest was performed using the standard protocol during bolus administration of intravenous contrast.  Multiplanar CT image reconstructions and MIPs were obtained to evaluate the vascular anatomy. RADIATION DOSE REDUCTION: This exam was performed according to the departmental dose-optimization program which includes automated exposure control, adjustment of the mA and/or kV according to patient size and/or use of iterative reconstruction technique. CONTRAST:  53m OMNIPAQUE IOHEXOL 350 MG/ML SOLN COMPARISON:  11/27/2020 FINDINGS: Cardiovascular: Heart is mildly enlarged. No filling defects in the pulmonary arteries to suggest pulmonary emboli. Prior CABG. Aortic atherosclerosis. No aneurysm. Mediastinum/Nodes: No mediastinal, hilar, or axillary adenopathy. Trachea and esophagus are unremarkable. Thyroid unremarkable. Lungs/Pleura: Moderate bilateral pleural effusions. Compressive atelectasis in the right lower lobe. Extensive ground-glass airspace disease in both lungs, most pronounced in the upper lobes concerning for pneumonia. Upper Abdomen: No acute findings Musculoskeletal: Chest wall soft tissues are unremarkable. No acute bony abnormality. Review of the MIP images confirms the above findings. IMPRESSION: No evidence of pulmonary embolus. Moderate bilateral pleural effusions. Bilateral ground-glass airspace opacities most pronounced in the upper lobes, left greater than right. Findings concerning for multifocal pneumonia. Aortic Atherosclerosis (ICD10-I70.0). Electronically Signed   By: KRolm BaptiseM.D.   On: 12/10/2021 19:28    Pertinent labs & imaging results that were available during my care of the patient were reviewed by me and considered in my medical decision making (see MDM for details).  Medications Ordered in ED Medications  azithromycin (ZITHROMAX) 500 mg in sodium chloride 0.9 % 250 mL IVPB (0 mg Intravenous Stopped 12/10/21 2123)  amiodarone (PACERONE) tablet 200 mg (has no administration in time range)  atorvastatin (LIPITOR) tablet 80 mg (80 mg Oral Given 12/10/21 2243)  diltiazem  (CARDIZEM CD) 24 hr capsule 120 mg (has no administration in time range)  doxazosin (CARDURA) tablet 4 mg (has no administration in time range)  lisinopril (ZESTRIL) tablet 40 mg (has no administration in time range)  pantoprazole (PROTONIX) EC tablet 40 mg (40 mg Oral Given 12/10/21 2242)  finasteride (PROSCAR) tablet 5 mg (5 mg Oral Given 12/10/21 2242)  rivaroxaban (XARELTO) tablet 20 mg (has no administration in time range)  furosemide (LASIX) injection 40 mg (40 mg Intravenous Given 12/10/21 2243)  cefTRIAXone (ROCEPHIN) 1 g in sodium chloride 0.9 % 100 mL IVPB (has no administration in time range)  cefTRIAXone (ROCEPHIN) 1 g in sodium chloride 0.9 % 100 mL IVPB (0 g Intravenous Stopped 12/10/21 1840)  iohexol (OMNIPAQUE) 350 MG/ML injection 75 mL (75 mLs Intravenous Contrast Given 12/10/21 1902)                                                                                                                                     Procedures .Critical Care  Performed by: Teressa Lower, MD Authorized by: Teressa Lower, MD   Critical care provider statement:    Critical care time (minutes):  30   Critical care was necessary to treat or prevent imminent or life-threatening deterioration of the following conditions:  Respiratory failure   Critical care was time spent personally by me on the following activities:  Development of treatment plan with patient or surrogate, discussions with consultants, evaluation of patient's response to treatment, examination of patient, ordering and review of laboratory studies, ordering and review of radiographic studies, ordering and performing treatments and interventions, pulse oximetry, re-evaluation of patient's condition and review of old charts   (including critical care time)  Medical Decision Making / ED Course   This patient presents to the ED for concern of shortness of breath, this involves an extensive number of treatment options, and is a  complaint that carries with it a high risk of complications and morbidity.  The differential diagnosis includes, PE, ACS, CHF exacerbation  MDM: Patient seen emergency room for evaluation of multiple complaints as described above.  Physical exam with rales bilaterally and bilateral lower extremity edema but is otherwise unremarkable.  Laboratory evaluation with a hemoglobin of 8.6, BNP elevated to 521, creatinine 1.99, BUN 39, CT PE obtained in the setting of significant hypoxia that is negative for PE and does show evidence of multifocal pneumonia.  Patient started on ceftriaxone azithromycin and due to pneumonia with new oxygen requirement,  Patient admitted.   Additional history obtained: -Additional history obtained from wife -External records from outside source obtained and reviewed including: Chart review including previous notes, labs, imaging, consultation notes   Lab Tests: -I ordered, reviewed, and interpreted labs.   The pertinent results include:   Labs Reviewed  COMPREHENSIVE METABOLIC PANEL - Abnormal; Notable for the following components:      Result Value   Glucose, Bld 111 (*)    BUN 39 (*)    Creatinine, Ser 1.99 (*)    Calcium 8.3 (*)    Total Protein 5.9 (*)    Albumin 2.8 (*)    GFR, Estimated 34 (*)  All other components within normal limits  CBC WITH DIFFERENTIAL/PLATELET - Abnormal; Notable for the following components:   RBC 2.88 (*)    Hemoglobin 8.6 (*)    HCT 27.4 (*)    Lymphs Abs 0.6 (*)    All other components within normal limits  BRAIN NATRIURETIC PEPTIDE - Abnormal; Notable for the following components:   B Natriuretic Peptide 521.0 (*)    All other components within normal limits  TROPONIN I (HIGH SENSITIVITY) - Abnormal; Notable for the following components:   Troponin I (High Sensitivity) 24 (*)    All other components within normal limits  TROPONIN I (HIGH SENSITIVITY) - Abnormal; Notable for the following components:   Troponin I (High  Sensitivity) 25 (*)    All other components within normal limits  EXPECTORATED SPUTUM ASSESSMENT W GRAM STAIN, RFLX TO RESP C  HIV ANTIBODY (ROUTINE TESTING W REFLEX)  LEGIONELLA PNEUMOPHILA SEROGP 1 UR AG  STREP PNEUMONIAE URINARY ANTIGEN  CBC  COMPREHENSIVE METABOLIC PANEL  POC SARS CORONAVIRUS 2 AG -  ED      EKG   EKG Interpretation  Date/Time:  Tuesday December 10 2021 16:59:21 EDT Ventricular Rate:  71 PR Interval:  210 QRS Duration: 107 QT Interval:  423 QTC Calculation: 460 R Axis:   63 Text Interpretation: Sinus rhythm Confirmed by Dossie Ocanas ([id number]) on 12/10/2021 11:46:44 PM         Imaging Studies ordered: I ordered imaging studies including CT PE I independently visualized and interpreted imaging. I agree with the radiologist interpretation   Medicines ordered and prescription drug management: Meds ordered this encounter  Medications   cefTRIAXone (ROCEPHIN) 1 g in sodium chloride 0.9 % 100 mL IVPB    Order Specific Question:   Antibiotic Indication:    Answer:   CAP   azithromycin (ZITHROMAX) 500 mg in sodium chloride 0.9 % 250 mL IVPB    Order Specific Question:   Antibiotic Indication:    Answer:   CAP   iohexol (OMNIPAQUE) 350 MG/ML injection 75 mL   amiodarone (PACERONE) tablet 200 mg   atorvastatin (LIPITOR) tablet 80 mg   diltiazem (CARDIZEM CD) 24 hr capsule 120 mg   doxazosin (CARDURA) tablet 4 mg   lisinopril (ZESTRIL) tablet 40 mg   pantoprazole (PROTONIX) EC tablet 40 mg   finasteride (PROSCAR) tablet 5 mg   rivaroxaban (XARELTO) tablet 20 mg   furosemide (LASIX) injection 40 mg   cefTRIAXone (ROCEPHIN) 1 g in sodium chloride 0.9 % 100 mL IVPB    Order Specific Question:   Antibiotic Indication:    Answer:   CAP    -I have reviewed the patients home medicines and have made adjustments as needed  Critical interventions Oxygen supplementation, antibiotics    Cardiac Monitoring: The patient was maintained on a cardiac monitor.  I  personally viewed and interpreted the cardiac monitored which showed an underlying rhythm of: NSR  Social Determinants of Health:  Factors impacting patients care include: none   Reevaluation: After the interventions noted above, I reevaluated the patient and found that they have :improved  Co morbidities that complicate the patient evaluation  Past Medical History:  Diagnosis Date   AAA (abdominal aortic aneurysm) ([place])    Arthritis    Basal cell carcinoma    Carotid stenosis    Coronary artery disease    Multvessel s/p CABG 2015   Enlarged prostate    Essential hypertension    GERD (gastroesophageal reflux disease)  Gout    History of colon polyps    History of kidney stones    Hyperlipidemia    Lumbar disc disease    Persistent atrial fibrillation (HCC)    Sleep apnea    CPAP      Dispostion: I considered admission for this patient, and due to pneumonia with new oxygen requirement, patient will require admission     Final Clinical Impression(s) / ED Diagnoses Final diagnoses:  None     '@PCDICTATION'$ @    Teressa Lower, MD 12/10/21 [phone]

## 2021-12-10 NOTE — Assessment & Plan Note (Signed)
Continue CPAP.  

## 2021-12-10 NOTE — Assessment & Plan Note (Addendum)
s/p ablation in 2019 with repeat in 11/2020. He did haverecurrent atrial fibrillation afterwards and underwent DCCV in 06/2021 Now in sinus Remains on Amiodarone '200mg'$  daily and Cardizem CD was discontinued given his cardiomyopathy and switched to Toprol-XL '25mg'$  daily.  Holding xarelto due to hemoptysis

## 2021-12-10 NOTE — ED Triage Notes (Signed)
SOB x 6 days. Just returned from vacation 4 days ago and SOB is getting worse. RA sats 89%. Non productive cough. Chest pain.

## 2021-12-10 NOTE — H&P (Signed)
History and Physical    Patient: Caleb Wolf JIR:[id number] DOB: 1940/11/01 DOA: 12/10/2021 DOS: the patient was seen and examined on 12/10/2021 PCP: Asencion Noble, MD  Patient coming from: Home  Chief Complaint:  Chief Complaint  Patient presents with   Shortness of Breath   HPI: Caleb Wolf is a 81 y.o. male with medical history significant of coronary artery disease, essential hypertension, GERD, hyperlipidemia, atrial fibrillation, and more presents ED with a chief complaint of dyspnea.  Patient reports that 2 weeks ago he started to have swelling in his legs.  This was after a plane ride, where his legs had to be hanging down for extended period of time.  1 week ago he started having dyspnea.  It was gradual in onset.  It was worse with exertion, and he had associated orthopnea.  He wears CPAP at night and still found himself gasping for air when he laid flat.  He denies any chest pain, palpitations, fever.  He reports a dry cough.  Patient does report that his left leg is chronically edematous compared to his right.  For the last 3 days he has been taking Lasix a.m. and p.m. to try to reduce the swelling.  He reports he has had improvement in his peripheral edema.  Patient has no inhalers at home, so was not able to attempt those to help with the shortness of breath.  Patient has no other complaints at this time.  Patient does not smoke, does not drink, does not use illicit drugs.  He is vaccinated for COVID.  Patient is full code. Review of Systems: As mentioned in the history of present illness. All other systems reviewed and are negative. Past Medical History:  Diagnosis Date   AAA (abdominal aortic aneurysm) (HCC)    Arthritis    Basal cell carcinoma    Carotid stenosis    Coronary artery disease    Multvessel s/p CABG 2015   Enlarged prostate    Essential hypertension    GERD (gastroesophageal reflux disease)    Gout    History of colon polyps    History of kidney stones     Hyperlipidemia    Lumbar disc disease    Persistent atrial fibrillation (HCC)    Sleep apnea    CPAP   Past Surgical History:  Procedure Laterality Date   ABLATION OF DYSRHYTHMIC FOCUS  07/28/2017   ATRIAL FIBRILLATION ABLATION N/A 07/28/2017   Procedure: ATRIAL FIBRILLATION ABLATION;  Surgeon: Thompson Grayer, MD;  Location: [place] [facility];  Service: Cardiovascular;  Laterality: N/A;   ATRIAL FIBRILLATION ABLATION N/A 12/04/2020   Procedure: ATRIAL FIBRILLATION ABLATION;  Surgeon: Thompson Grayer, MD;  Location: [place] [facility];  Service: Cardiovascular;  Laterality: N/A;   BACK SURGERY     CARDIAC CATHETERIZATION  05/22/2014   Procedure: IABP INSERTION;  Surgeon: Leonie Man, MD;  Location: [facility] CATH LAB;  Service: Cardiovascular;;   CARDIOVERSION N/A 04/29/2017   Procedure: CARDIOVERSION;  Surgeon: Satira Sark, MD;  [facility];  Service: Cardiovascular;  Laterality: N/A;   CARDIOVERSION N/A 08/13/2017   Procedure: CARDIOVERSION;  Surgeon: Pixie Casino, MD;  Location: [facility] ENDOSCOPY;  Service: Cardiovascular;  Laterality: N/A;   CARDIOVERSION N/A 08/15/2019   Procedure: CARDIOVERSION;  Surgeon: Dorothy Spark, MD;  Location: [facility] ENDOSCOPY;  Service: Cardiovascular;  Laterality: N/A;   CARDIOVERSION N/A 03/09/2020   Procedure: CARDIOVERSION;  Surgeon: Buford Dresser, MD;  Location: [place];  Service: Cardiovascular;  Laterality: N/A;   CARDIOVERSION N/A 07/06/2020   Procedure: CARDIOVERSION;  Surgeon: Sanda Klein, MD;  Location: [place] ENDOSCOPY;  Service: Cardiovascular;  Laterality: N/A;   CARDIOVERSION N/A 01/18/2021   Procedure: CARDIOVERSION;  Surgeon: Skeet Latch, MD;  Location: [facility] ENDOSCOPY;  Service: Cardiovascular;  Laterality: N/A;   CARDIOVERSION N/A 06/17/2021   Procedure: CARDIOVERSION;  Surgeon: Geralynn Rile, MD;  Location: [place];  Service: Cardiovascular;  Laterality: N/A;   Cataract surgery Right    COLONOSCOPY      COLONOSCOPY N/A 12/30/2017   Procedure: COLONOSCOPY;  Surgeon: Rogene Houston, MD;  [facility];  Service: Endoscopy;  Laterality: N/A;   CORONARY ARTERY BYPASS GRAFT N/A 05/22/2014   Procedure: CORONARY ARTERY BYPASS GRAFTING (CABG) times three using left internal mammary and right saphenous vein.;  Surgeon: Melrose Nakayama, MD;  Location: [place];  Service: Open Heart Surgery;  Laterality: N/A;   ENDARTERECTOMY Left 02/17/2014   Procedure: ENDARTERECTOMY CAROTID WITH PATCH ANGIOPLASTY;  Surgeon: Mal Misty, MD;  Location: [place];  Service: Vascular;  Laterality: Left;   ESOPHAGEAL DILATION N/A 08/22/2015   Procedure: ESOPHAGEAL DILATION;  Surgeon: Rogene Houston, MD;  [facility];  Service: Endoscopy;  Laterality: N/A;   ESOPHAGOGASTRODUODENOSCOPY N/A 08/22/2015   Procedure: ESOPHAGOGASTRODUODENOSCOPY (EGD);  Surgeon: Rogene Houston, MD;  [facility];  Service: Endoscopy;  Laterality: N/A;  12:45 - moved to 1:55 - Ann notified pt   ESOPHAGOGASTRODUODENOSCOPY N/A 12/30/2017   Procedure: ESOPHAGOGASTRODUODENOSCOPY (EGD);  Surgeon: Rogene Houston, MD;  [facility];  Service: Endoscopy;  Laterality: N/A;  200   EYE SURGERY     cataract extraction (right) with repair macular tear , with IOL     right   FRACTURE SURGERY     bilateral wrist fractures- one ORIF   JOINT REPLACEMENT  2011   left knee   LEFT HEART CATHETERIZATION WITH CORONARY ANGIOGRAM N/A 05/22/2014   Procedure: LEFT HEART CATHETERIZATION WITH CORONARY ANGIOGRAM;  Surgeon: Leonie Man, MD;  Location: [facility] CATH LAB;  Service: Cardiovascular;  Laterality: N/A;   LITHOTRIPSY     PARS PLANA VITRECTOMY W/ REPAIR OF MACULAR HOLE     RHINOPLASTY     TEE WITHOUT CARDIOVERSION N/A 04/29/2017   Procedure: TRANSESOPHAGEAL ECHOCARDIOGRAM (TEE) WITH PROPOFOL;  Surgeon: Satira Sark, MD;  [facility];  Service: Cardiovascular;  Laterality: N/A;   TONSILLECTOMY      TOTAL HIP ARTHROPLASTY  12/08/2011   Procedure: TOTAL HIP ARTHROPLASTY;  Surgeon: Gearlean Alf, MD;  Location: [facility] ORS;  Service: Orthopedics;  Laterality: Right;   UPPER GI ENDOSCOPY  12/18/2015   Procedure: UPPER GI ENDOSCOPY;  Surgeon: Ralene Ok, MD;  Location: [facility] ORS;  Service: General;;   WRIST SURGERY Right 65yr ago   WRIST SURGERY Left    Social History:  reports that he quit smoking about 39 years ago. His smoking use included cigarettes. He started smoking about 61 years ago. He has a 37.50 pack-year smoking history. He has never used smokeless tobacco. He reports that he does not drink alcohol and does not use drugs.  Allergies  Allergen Reactions   Codeine Sulfate Nausea Only    Family History  Problem Relation Age of Onset   Allergies Mother    Heart disease Mother    Hypertension Mother    Heart disease Father        MI    Prior to Admission medications  Medication Sig Start Date End Date Taking? Authorizing Provider  acetaminophen (TYLENOL) 500 MG tablet Take 1,000 mg by mouth every 6 (six) hours as needed for moderate pain.   Yes [provider]  amiodarone (PACERONE) 200 MG tablet Take 1 tablet (200 mg total) by mouth daily. 07/04/21  Yes Sherran Needs, NP  amoxicillin (AMOXIL) 500 MG capsule Take 2,000 mg by mouth See admin instructions. Take 4 capsules (2000 mg) by mouth before dental procedures 01/24/20  Yes [provider]  atorvastatin (LIPITOR) 80 MG tablet TAKE 1 TABLET BY MOUTH EVERY EVENING 04/15/21  Yes Allred, Jeneen Rinks, MD  colchicine 0.6 MG tablet Take 0.6 mg by mouth daily as needed (for gout flare up).   Yes [provider]  diltiazem (CARDIZEM CD) 120 MG 24 hr capsule TAKE 1 CAPSULE BY MOUTH EVERY DAY 11/04/21  Yes Jerline Pain, MD  doxazosin (CARDURA) 4 MG tablet Take 4 mg by mouth in the morning. 07/06/17  Yes [provider]  finasteride (PROSCAR) 5 MG tablet Take 5 mg by mouth every evening.    Yes [provider]  fluticasone (FLONASE) 50 MCG/ACT nasal spray Place 1 spray into both nostrils daily as needed for allergies or rhinitis.   Yes [provider]  furosemide (LASIX) 40 MG tablet TAKE 1 TABLET BY MOUTH TWICE A DAY Patient taking differently: Take 40 mg by mouth daily. If needed can take an additional tablet 05/16/21  Yes Sherran Needs, NP  lisinopril (PRINIVIL,ZESTRIL) 40 MG tablet Take 40 mg by mouth in the morning.   Yes [provider]  nitroGLYCERIN (NITROSTAT) 0.4 MG SL tablet Place 1 tablet (0.4 mg total) under the tongue every 5 (five) minutes x 3 doses as needed for chest pain. 06/04/20  Yes Allred, Jeneen Rinks, MD  pantoprazole (PROTONIX) 40 MG tablet Take 1 tablet (40 mg total) by mouth daily before breakfast. Patient taking differently: Take 40 mg by mouth every evening. 02/19/16  Yes Rehman, Mechele Dawley, MD  furosemide (LASIX) 40 MG tablet Take 1 tablet by mouth 2 (two) times daily.    [provider]  rivaroxaban (XARELTO) 20 MG TABS tablet Take 1 tablet (20 mg total) by mouth daily with supper. Patient taking differently: Take 20 mg by mouth daily. 05/31/21   Sherran Needs, NP    Physical Exam: Vitals:   12/10/21 1655 12/10/21 1730 12/10/21 1830 12/10/21 1930  BP:  (!) 130/52 (!) 128/57 (!) 138/58  Pulse:  61 64 67  Resp:  17 (!) 21 20  Temp:      TempSrc:      SpO2:  98% 100% 99%  Weight: 107.9 kg     Height: '5\' 10"'$  (1.778 m)      1.  General: Patient lying supine in bed,  no acute distress   2. Psychiatric: Alert and oriented x 3, mood and behavior normal for situation, pleasant and cooperative with exam   3. Neurologic: Speech and language are normal, face is symmetric, moves all 4 extremities voluntarily, at baseline without acute deficits on limited exam   4. HEENMT:  Head is atraumatic, normocephalic, pupils reactive to light, neck is supple, trachea is midline, mucous membranes are moist   5. Respiratory : Lungs are  diminished in the lower lung fields bilaterally but otherwise no wheezing, rhonchi, rales, no cyanosis, no increase in work of breathing or accessory muscle use   6. Cardiovascular : Heart rate normal, rhythm is regular, no murmurs, rubs  or gallops, peripheral edema present, peripheral pulses palpated   7. Gastrointestinal:  Abdomen is soft, nondistended, nontender to palpation bowel sounds active, no masses or organomegaly palpated   8. Skin:  Skin is warm, dry and intact without rashes, acute lesions, or ulcers on limited exam   9.Musculoskeletal:  No acute deformities or trauma, no asymmetry in tone, peripheral edema present, peripheral pulses palpated, no tenderness to palpation in the extremities  Data Reviewed: In the ED Temp 98.4, heart rate 61-70, respiratory rate 17-21, blood pressure 128/46-138/58, satting at 99% on 3 L nasal cannula No leukocytosis with white blood cell count of 8.3, hemoglobin 8.6 Chemistry reveals an elevated creatinine which is at baseline 1.99 BNP is 521 Troponin flat at 24 and 25 CTA shows no PE, moderate bilateral pleural effusion.  Multifocal pneumonia EKG shows heart rate 71, sinus rhythm, QTc 460 with baseline wander Chest x-ray shows pneumonia Patient was started on Rocephin and Zithromax  Assessment and Plan: * Pneumonia - Multifocal pneumonia finding on CTA -COVID pending -Hypoxia requiring 3 L nasal cannula -No leukocytosis -Continue Rocephin and Zithromax -Continue to monitor  Acute respiratory failure with hypoxia (HCC) - Oxygen sats as low as 85% on room air -Requiring 3 L nasal cannula to maintain mid 90s -Secondary to CHF and pneumonia -see plans for these assessments -Wean off O2 as tolerated  CAD (coronary artery disease) - Continue statin, continue ACE -Chest pain. -EKG has baseline wander but no acute ischemic changes -Continue to monitor  Paroxysmal atrial fibrillation (HCC) - Currently in sinus -Continue amiodarone,  Xarelto, diltiazem -Continue to monitor  Acute CHF (HCC) - No known history of CHF with last echo being in February -normal ejection fraction and indeterminate diastolic parameters -Update echo -Strict I's and O's -Daily weights -Lasix -Continue statin, lisinopril -Likely triggered by fasting while on a cruise-high salt diet -Major criteria include orthopnea and cardiomegaly on CTA -Monitor criteria include dyspnea on exertion, pleural effusion -Monitor on telemetry  GERD (gastroesophageal reflux disease) - Continue Protonix  HTN (hypertension) - Continue lisinopril, diltiazem, Cardura  OSA (obstructive sleep apnea) - Continue CPAP      Advance Care Planning:   Code Status: Prior full  Consults: No consults  Family Communication: Wife at bedside  Severity of Illness: The appropriate patient status for this patient is INPATIENT. Inpatient status is judged to be reasonable and necessary in order to provide the required intensity of service to ensure the patient's safety. The patient's presenting symptoms, physical exam findings, and initial radiographic and laboratory data in the context of their chronic comorbidities is felt to place them at high risk for further clinical deterioration. Furthermore, it is not anticipated that the patient will be medically stable for discharge from the hospital within 2 midnights of admission.   * I certify that at the point of admission it is my clinical judgment that the patient will require inpatient hospital care spanning beyond 2 midnights from the point of admission due to high intensity of service, high risk for further deterioration and high frequency of surveillance required.*  Author: Rolla Plate, DO 12/10/2021 9:18 PM  For on call review [email or web address].

## 2021-12-10 NOTE — Assessment & Plan Note (Signed)
Continue Protonix °

## 2021-12-10 NOTE — Progress Notes (Signed)
Asked Caleb Holster, RN if patient brought his CPAP from home.  RN asked patient, and he did not.  I had her ask if he wanted [place] to provide him with one, patient stated that he believed he would be alright with the [place].  I had RN inform patient that if that changes, to please let us know.

## 2021-12-11 ENCOUNTER — Inpatient Hospital Stay (HOSPITAL_COMMUNITY): Payer: Medicare HMO

## 2021-12-11 ENCOUNTER — Other Ambulatory Visit (HOSPITAL_COMMUNITY): Payer: Self-pay | Admitting: *Deleted

## 2021-12-11 DIAGNOSIS — J9601 Acute respiratory failure with hypoxia: Secondary | ICD-10-CM | POA: Diagnosis not present

## 2021-12-11 DIAGNOSIS — K219 Gastro-esophageal reflux disease without esophagitis: Secondary | ICD-10-CM | POA: Diagnosis not present

## 2021-12-11 DIAGNOSIS — I2510 Atherosclerotic heart disease of native coronary artery without angina pectoris: Secondary | ICD-10-CM | POA: Diagnosis not present

## 2021-12-11 DIAGNOSIS — I5022 Chronic systolic (congestive) heart failure: Secondary | ICD-10-CM

## 2021-12-11 DIAGNOSIS — I509 Heart failure, unspecified: Secondary | ICD-10-CM | POA: Diagnosis not present

## 2021-12-11 LAB — COMPREHENSIVE METABOLIC PANEL
ALT: 24 U/L (ref 0–44)
AST: 16 U/L (ref 15–41)
Albumin: 2.8 g/dL — ABNORMAL LOW (ref 3.5–5.0)
Alkaline Phosphatase: 96 U/L (ref 38–126)
Anion gap: 10 (ref 5–15)
BUN: 38 mg/dL — ABNORMAL HIGH (ref 8–23)
CO2: 28 mmol/L (ref 22–32)
Calcium: 8.5 mg/dL — ABNORMAL LOW (ref 8.9–10.3)
Chloride: 103 mmol/L (ref 98–111)
Creatinine, Ser: 1.93 mg/dL — ABNORMAL HIGH (ref 0.61–1.24)
GFR, Estimated: 35 mL/min — ABNORMAL LOW (ref >60)
Glucose, Bld: 112 mg/dL — ABNORMAL HIGH (ref 70–99)
Potassium: 4.2 mmol/L (ref 3.5–5.1)
Sodium: 141 mmol/L (ref 135–145)
Total Bilirubin: 1 mg/dL (ref 0.3–1.2)
Total Protein: 5.9 g/dL — ABNORMAL LOW (ref 6.5–8.1)

## 2021-12-11 LAB — ECHOCARDIOGRAM COMPLETE
AR max vel: 2.69 cm2
AV Area VTI: 2.85 cm2
AV Area mean vel: 2.69 cm2
AV Mean grad: 3 mmHg
AV Peak grad: 5.9 mmHg
Ao pk vel: 1.21 m/s
Area-P 1/2: 3.27 cm2
Calc EF: 46.9 %
Height: 69 in
MV VTI: 2.26 cm2
S' Lateral: 3.6 cm
Single Plane A2C EF: 50 %
Single Plane A4C EF: 43.2 %
Weight: 3650.82 oz

## 2021-12-11 LAB — CBC
HCT: 28.1 % — ABNORMAL LOW (ref 39.0–52.0)
Hemoglobin: 8.7 g/dL — ABNORMAL LOW (ref 13.0–17.0)
MCH: 29.9 pg (ref 26.0–34.0)
MCHC: 31 g/dL (ref 30.0–36.0)
MCV: 96.6 fL (ref 80.0–100.0)
Platelets: 242 10*3/uL (ref 150–400)
RBC: 2.91 MIL/uL — ABNORMAL LOW (ref 4.22–5.81)
RDW: 14.8 % (ref 11.5–15.5)
WBC: 8 10*3/uL (ref 4.0–10.5)
nRBC: 0 % (ref 0.0–0.2)

## 2021-12-11 LAB — PROCALCITONIN: Procalcitonin: <0.1 ng/mL

## 2021-12-11 LAB — HIV ANTIBODY (ROUTINE TESTING W REFLEX): HIV Screen 4th Generation wRfx: NONREACTIVE

## 2021-12-11 LAB — STREP PNEUMONIAE URINARY ANTIGEN: Strep Pneumo Urinary Antigen: NEGATIVE

## 2021-12-11 MED ORDER — LEVALBUTEROL HCL 1.25 MG/0.5ML IN NEBU
1.2500 mg | INHALATION_SOLUTION | Freq: Three times a day (TID) | RESPIRATORY_TRACT | Status: DC
  Administered 2021-12-11 – 2021-12-13 (×5): 1.25 mg via RESPIRATORY_TRACT
  Filled 2021-12-11 (×5): qty 0.5

## 2021-12-11 MED ORDER — LEVALBUTEROL HCL 0.63 MG/3ML IN NEBU
INHALATION_SOLUTION | RESPIRATORY_TRACT | Status: AC
  Filled 2021-12-11: qty 3

## 2021-12-11 MED ORDER — RIVAROXABAN 15 MG PO TABS
15.0000 mg | ORAL_TABLET | Freq: Every day | ORAL | Status: DC
  Administered 2021-12-11 – 2021-12-12 (×2): 15 mg via ORAL
  Filled 2021-12-11 (×2): qty 1

## 2021-12-11 MED ORDER — SENNOSIDES-DOCUSATE SODIUM 8.6-50 MG PO TABS: 1.0000 | ORAL_TABLET | Freq: Every evening | ORAL | Status: DC | PRN

## 2021-12-11 MED ORDER — HYDRALAZINE HCL 20 MG/ML IJ SOLN: 10.0000 mg | INTRAMUSCULAR | Status: DC | PRN

## 2021-12-11 MED ORDER — TRAZODONE HCL 50 MG PO TABS: 50.0000 mg | ORAL_TABLET | Freq: Every evening | ORAL | Status: DC | PRN

## 2021-12-11 MED ORDER — LEVALBUTEROL HCL 1.25 MG/0.5ML IN NEBU
1.2500 mg | INHALATION_SOLUTION | Freq: Three times a day (TID) | RESPIRATORY_TRACT | Status: DC
  Administered 2021-12-11 (×2): 1.25 mg via RESPIRATORY_TRACT
  Filled 2021-12-11: qty 0.5

## 2021-12-11 MED ORDER — GUAIFENESIN 100 MG/5ML PO LIQD: 5.0000 mL | ORAL | Status: DC | PRN

## 2021-12-11 MED ORDER — METOPROLOL TARTRATE 5 MG/5ML IV SOLN: 5.0000 mg | INTRAVENOUS | Status: DC | PRN

## 2021-12-11 NOTE — Progress Notes (Signed)
PROGRESS NOTE    Caleb Wolf  DGU:[id number] DOB: 1941-03-27 DOA: 12/10/2021 PCP: Asencion Noble, MD   Brief Narrative:  81 y.o. male with medical history significant of coronary artery disease, essential hypertension, GERD, hyperlipidemia, atrial fibrillation, and more presents ED with a chief complaint of dyspnea. Patient reported of bilateral leg swelling and dyspnea on exertion ongoing for the past 1-2 weeks.  CTA chest negative, shows moderate bilateral pleural effusions with concerns for multifocal pneumonia.  COVID test is negative, started on IV Rocephin and azithromycin.  But later his symptoms were thought to be due to CHF exacerbation therefore antibiotics discontinued.   Assessment & Plan:  Principal Problem:   Pneumonia Active Problems:   OSA (obstructive sleep apnea)   HTN (hypertension)   GERD (gastroesophageal reflux disease)   Acute CHF (HCC)   Paroxysmal atrial fibrillation (HCC)   CAD (coronary artery disease)   Acute respiratory failure with hypoxia (HCC)     Assessment and Plan: Acute respiratory failure with hypoxia (HCC) Bilateral pleural effusion -There is suspicion for multifocal pneumonia versus CHF exacerbation.  Procalcitonin level is negative therefore I will discontinue antibiotics.  BNP is elevated, continue diuretics.  Wean off oxygen as appropriate. -CTA chest is negative for pulmonary embolism  Multifocal infiltrate - COVID-19 is negative.  I suspect his multifocal infiltrates are secondary to fluid overload.  Procalcitonin is negative.  Stop antibiotics.  Acute diastolic congestive heart failure, class III --We will start patient on diuretics Lasix 40 mg IV twice daily. - Echocardiogram February 2021-EF 55%, interventricular septal flattening, elevated RV pressures, dilated cardiomyopathy - Cardiology consulted.  CKD stage IIIb - Baseline creatinine around 1.8.  Today 1.3.  Continue to monitor as patient is on lisinopril and getting IV  diuretics.  CAD (coronary artery disease) -Currently patient remains chest pain-free.  On Xarelto, statin, lisinopril, Cardizem  Paroxysmal atrial fibrillation (HCC) -Continue amiodarone, Cardizem, Xarelto   GERD (gastroesophageal reflux disease) -Protonix  HTN (hypertension) -Continue home medications  OSA (obstructive sleep apnea) -CPAP   DVT prophylaxis: Xarelto Code Status: Full code Family Communication: Wife present at bedside  Status is: Inpatient Remains inpatient appropriate because: Maintain hospital stay for IV diuretics as patient still has significant orthopnea.  On 2-3 L nasal cannula.  Not on any home oxygen.       Subjective: Reports of exertional dyspnea and bilateral lower extremity swelling which is slightly improved compared to yesterday evening after getting diuretics.  He denies any fevers, chills.    Examination:  General exam: Appears calm and comfortable, 3 L a cannula Respiratory system: Bibasilar crackles Cardiovascular system: S1 & S2 heard, RRR. No JVD, murmurs, rubs, gallops or clicks.  3+ bilateral lower extremity pitting edema Gastrointestinal system: Abdomen is nondistended, soft and nontender. No organomegaly or masses felt. Normal bowel sounds heard. Central nervous system: Alert and oriented. No focal neurological deficits. Extremities: Symmetric 5 x 5 power. Skin: No rashes, lesions or ulcers Psychiatry: Judgement and insight appear normal. Mood & affect appropriate.     Objective: Vitals:   12/10/21 2133 12/10/21 2150 12/11/21 0359 12/11/21 0753  BP: (!) 142/53 (!) 143/57 (!) 142/59 (!) 125/58  Pulse: 64 72 62 69  Resp: '18 18 20 16  '$ Temp: 98.2 F (36.8 C) 99.1 F (37.3 C) 98.2 F (36.8 C) 98.4 F (36.9 C)  TempSrc: Oral     SpO2: 98% 100% 100% 97%  Weight:  108.8 kg 103.5 kg   Height:  '5\' 9"'$  (1.753 m)  Intake/Output Summary (Last 24 hours) at 12/11/2021 0827 Last data filed at 12/11/2021 0500 Gross per 24 hour   Intake --  Output 1200 ml  Net -1200 ml   Filed Weights   12/10/21 1655 12/10/21 2150 12/11/21 0359  Weight: 107.9 kg 108.8 kg 103.5 kg     Data Reviewed:   CBC: Recent Labs  Lab 12/10/21 1700 12/11/21 0536  WBC 8.3 8.0  NEUTROABS 6.7  --   HGB 8.6* 8.7*  HCT 27.4* 28.1*  MCV 95.1 96.6  PLT 248 161   Basic Metabolic Panel: Recent Labs  Lab 12/10/21 1700 12/11/21 0536  NA 139 141  K 4.2 4.2  CL 106 103  CO2 25 28  GLUCOSE 111* 112*  BUN 39* 38*  CREATININE 1.99* 1.93*  CALCIUM 8.3* 8.5*   GFR: Estimated Creatinine Clearance: 36.8 mL/min (A) (by C-G formula based on SCr of 1.93 mg/dL (H)). Liver Function Tests: Recent Labs  Lab 12/10/21 1700 12/11/21 0536  AST 18 16  ALT 28 24  ALKPHOS 95 96  BILITOT 1.2 1.0  PROT 5.9* 5.9*  ALBUMIN 2.8* 2.8*   No results for input(s): "LIPASE", "AMYLASE" in the last 168 hours. No results for input(s): "AMMONIA" in the last 168 hours. Coagulation Profile: No results for input(s): "INR", "PROTIME" in the last 168 hours. Cardiac Enzymes: No results for input(s): "CKTOTAL", "CKMB", "CKMBINDEX", "TROPONINI" in the last 168 hours. BNP (last 3 results) No results for input(s): "PROBNP" in the last 8760 hours. HbA1C: No results for input(s): "HGBA1C" in the last 72 hours. CBG: No results for input(s): "GLUCAP" in the last 168 hours. Lipid Profile: No results for input(s): "CHOL", "HDL", "LDLCALC", "TRIG", "CHOLHDL", "LDLDIRECT" in the last 72 hours. Thyroid Function Tests: No results for input(s): "TSH", "T4TOTAL", "FREET4", "T3FREE", "THYROIDAB" in the last 72 hours. Anemia Panel: No results for input(s): "VITAMINB12", "FOLATE", "FERRITIN", "TIBC", "IRON", "RETICCTPCT" in the last 72 hours. Sepsis Labs: No results for input(s): "PROCALCITON", "LATICACIDVEN" in the last 168 hours.  No results found for this or any previous visit (from the past 240 hour(s)).       Radiology Studies: CT Angio Chest PE W and/or Wo  Contrast  Result Date: 12/10/2021 CLINICAL DATA:  Pulmonary embolism (PE) suspected, high prob. Shortness of breath. Cough. Chest pain. EXAM: CT ANGIOGRAPHY CHEST WITH CONTRAST TECHNIQUE: Multidetector CT imaging of the chest was performed using the standard protocol during bolus administration of intravenous contrast. Multiplanar CT image reconstructions and MIPs were obtained to evaluate the vascular anatomy. RADIATION DOSE REDUCTION: This exam was performed according to the departmental dose-optimization program which includes automated exposure control, adjustment of the mA and/or kV according to patient size and/or use of iterative reconstruction technique. CONTRAST:  37m OMNIPAQUE IOHEXOL 350 MG/ML SOLN COMPARISON:  11/27/2020 FINDINGS: Cardiovascular: Heart is mildly enlarged. No filling defects in the pulmonary arteries to suggest pulmonary emboli. Prior CABG. Aortic atherosclerosis. No aneurysm. Mediastinum/Nodes: No mediastinal, hilar, or axillary adenopathy. Trachea and esophagus are unremarkable. Thyroid unremarkable. Lungs/Pleura: Moderate bilateral pleural effusions. Compressive atelectasis in the right lower lobe. Extensive ground-glass airspace disease in both lungs, most pronounced in the upper lobes concerning for pneumonia. Upper Abdomen: No acute findings Musculoskeletal: Chest wall soft tissues are unremarkable. No acute bony abnormality. Review of the MIP images confirms the above findings. IMPRESSION: No evidence of pulmonary embolus. Moderate bilateral pleural effusions. Bilateral ground-glass airspace opacities most pronounced in the upper lobes, left greater than right. Findings concerning for multifocal pneumonia. Aortic Atherosclerosis (ICD10-I70.0). Electronically  Signed   By: Rolm Baptise [name]   On: 12/10/2021 19:28   DG Chest 2 View  Result Date: 12/10/2021 CLINICAL DATA:  Shortness of breath EXAM: CHEST - 2 VIEW COMPARISON:  November 20, 2017 FINDINGS: The heart size and  mediastinal contours are stable. Patchy consolidation is identified throughout bilateral lungs. Small bilateral pleural effusions are noted. The visualized skeletal structures are stable. IMPRESSION: Patchy consolidation identified throughout bilateral lungs, suspicious for pneumonia. Electronically Signed   By: Abelardo Diesel [name]   On: 12/10/2021 09:52        Scheduled Meds:  amiodarone  200 mg Oral Daily   atorvastatin  80 mg Oral QPM   diltiazem  120 mg Oral Daily   doxazosin  4 mg Oral q AM   finasteride  5 mg Oral QPM   furosemide  40 mg Intravenous Q12H   lisinopril  40 mg Oral q AM   pantoprazole  40 mg Oral QPM   rivaroxaban  15 mg Oral Q supper   Continuous Infusions:  azithromycin Stopped (12/10/21 2123)   cefTRIAXone (ROCEPHIN)  IV       LOS: 1 day   Time spent= 35 mins    Genesis Novosad Arsenio Loader, MD [facility]  If 7PM-7AM, please contact night-coverage  12/11/2021, 8:27 AM

## 2021-12-11 NOTE — Progress Notes (Signed)
*  PRELIMINARY RESULTS* Echocardiogram 2D Echocardiogram has been performed.  Elpidio Anis 12/11/2021, 1:48 PM

## 2021-12-11 NOTE — Progress Notes (Signed)
Patient uses a CPAP for sleep at home but he does not wish to use one of ours during his hospital stay.  He will inform us if he changes his mind.  RT will continue to monitor.

## 2021-12-11 NOTE — TOC Progression Note (Signed)
  Transition of Care [facility]) Screening Note   Patient Details  Name: Caleb Wolf Date of Birth: 07-May-1941   Transition of Care [facility]) CM/SW Contact:    Boneta Lucks, RN Phone Number: 12/11/2021, 11:03 AM  Diuresing - DC 2-3 days   Transition of Care [facility]) has reviewed patient and no TOC needs have been identified at this time. We will continue to monitor patient advancement through interdisciplinary progression rounds. If new patient transition needs arise, please place a TOC consult.       Barriers to Discharge: Continued Medical Work up  Expected Discharge Plan and Services         Living arrangements for the past 2 months: [place]

## 2021-12-11 NOTE — Evaluation (Addendum)
Physical Therapy Evaluation Patient Details Name: Caleb Wolf MRN: [id number] DOB: 1941/01/13 Today's Date: 12/11/2021  History of Present Illness  81 y.o. male with medical history significant of coronary artery disease, essential hypertension, GERD, hyperlipidemia, atrial fibrillation, and more presents ED with a chief complaint of dyspnea. Patient reported of bilateral leg swelling and dyspnea on exertion ongoing for the past 1-2 weeks.  CTA chest negative, shows moderate bilateral pleural effusions with concerns for multifocal pneumonia.  COVID test is negative, started on IV Rocephin and azithromycin.  But later his symptoms were thought to be due to CHF exacerbation therefore antibiotics discontinued.   Clinical Impression  Patient supine in bed and wife present upon therapist entry and patient agreeable to participating in PT evaluation today. Patient had been observed sitting at EOB eating his lunch previously. Patient personal SPC in room. Patient performed bed mobility modified independent. Patient requiring supervision for safety with transfers; with sit to stand slightly unsteady upon initial standing, reaching for bedrail with use of SPC in other hand. Patient ambulated with personal SPC 200 feet demonstrating flexion at the hips and a somewhat unsteady cadence using SPC with 3 standing rest breaks while holding railing. Patient given cues to slow down, not rush and take deeper breathes while ambulating. Patient demonstrated dyspnea on exertion and was limited by fatigue. Patient on room air throughout. Patient evaluated by Physical Therapy with no further acute PT needs identified. All education has been completed and the patient has no further questions. See below for any follow-up Physical Therapy or equipment needs. PT is signing off. Thank you for this referral.      Recommendations for follow up therapy are one component of a multi-disciplinary discharge planning process, led by the  attending physician.  Recommendations may be updated based on patient status, additional functional criteria and insurance authorization.  Follow Up Recommendations Outpatient PT      Assistance Recommended at Discharge PRN  Patient can return home with the following  A little help with walking and/or transfers;A little help with bathing/dressing/bathroom;Assistance with cooking/housework;Help with stairs or ramp for entrance    Equipment Recommendations None recommended by PT  Recommendations for Other Services       Functional Status Assessment Patient has had a recent decline in their functional status and demonstrates the ability to make significant improvements in function in a reasonable and predictable amount of time.     Precautions / Restrictions Precautions Precautions: Fall Precaution Comments: patient reports fell a couple times in garden on uneven ground but no injury resulted Restrictions Weight Bearing Restrictions: No      Mobility  Bed Mobility Overal bed mobility: Modified Independent      Transfers Overall transfer level: Needs assistance   Transfers: Sit to/from Stand, Bed to chair/wheelchair/BSC Sit to Stand: Supervision   Step pivot transfers: Supervision       General transfer comment: slightly unsteady upon initial standing, reaching for bedrail with use of SPC in other hand    Ambulation/Gait Ambulation/Gait assistance: Min guard, Supervision Gait Distance (Feet): 200 Feet Assistive device: Straight cane Gait Pattern/deviations: Step-through pattern, Decreased step length - right, Decreased step length - left, Decreased stride length, Trunk flexed Gait velocity: decreased     General Gait Details: somewhat unsteady cadence using SPC with 3 standing rest breaks while holding railing; cues to slow down, not rush and take deeper breathes; dyspnea on exertion; limited by fatigue; on room air throughout  [employer]  Modified Rankin (Stroke Patients Only)       Balance Overall balance assessment: History of Falls   Sitting balance-Leahy Scale: Good Sitting balance - Comments: sitting at EOB   Standing balance support: Single extremity supported, During functional activity Standing balance-Leahy Scale: Fair Standing balance comment: fair/good with SPC       Pertinent Vitals/Pain Pain Assessment Pain Assessment: No/denies pain    Home Living Family/patient expects to be discharged to:: Private residence Living Arrangements: Spouse/significant other Available Help at Discharge: Available 24 hours/day;Family Type of Home: House Home Access: Level entry       Home Layout: One level Home Equipment: Rollator (4 wheels);Cane - single point;BSC/3in1;Hand held shower head      Prior Function Prior Level of Function : Driving     Mobility Comments: community distances with [place]; did use Rollator and scooter on recent vacation trip with a lot of walking       Hand Dominance        Extremity/Trunk Assessment   Upper Extremity Assessment Upper Extremity Assessment: Overall WFL for tasks assessed    Lower Extremity Assessment Lower Extremity Assessment: Overall WFL for tasks assessed    Cervical / Trunk Assessment Cervical / Trunk Assessment: Normal  Communication   Communication: No difficulties  Cognition Arousal/Alertness: Awake/alert Behavior During Therapy: WFL for tasks assessed/performed Overall Cognitive Status: Within Functional Limits for tasks assessed          General Comments      Exercises     Assessment/Plan    PT Assessment All further PT needs can be met in the next venue of care  PT Problem List Decreased mobility;Decreased activity tolerance;Decreased balance       PT Treatment Interventions Patient/family education;Therapeutic activities    PT Goals (Current goals can be found in the Care Plan section)       Frequency          AM-PAC  PT "6 Clicks" Mobility  Outcome Measure Help needed turning from your back to your side while in a flat bed without using bedrails?: None Help needed moving from lying on your back to sitting on the side of a flat bed without using bedrails?: None Help needed moving to and from a bed to a chair (including a wheelchair)?: A Little Help needed standing up from a chair using your arms (e.g., wheelchair or bedside chair)?: A Little Help needed to walk in hospital room?: A Little Help needed climbing 3-5 steps with a railing? : A Little 6 Click Score: 20    End of Session Equipment Utilized During Treatment: Gait belt Activity Tolerance: Patient tolerated treatment well;Patient limited by fatigue Patient left: in bed;with call bell/phone within reach;with family/visitor present Nurse Communication: Mobility status PT Visit Diagnosis: Other abnormalities of gait and mobility (R26.89);History of falling (Z91.81);Unsteadiness on feet (R26.81)    Time: 0814-4818 PT Time Calculation (min) (ACUTE ONLY): 26 min   Charges:   PT Evaluation $PT Eval Low Complexity: 1 Low PT Treatments $Therapeutic Activity: 8-22 mins        Floria Raveling. Hartnett-Rands, MS, PT [place] [phone]  Pamala Hurry  Hartnett-Rands 12/11/2021, 3:00 PM

## 2021-12-12 ENCOUNTER — Inpatient Hospital Stay (HOSPITAL_COMMUNITY): Payer: Medicare HMO

## 2021-12-12 DIAGNOSIS — J189 Pneumonia, unspecified organism: Secondary | ICD-10-CM | POA: Diagnosis not present

## 2021-12-12 DIAGNOSIS — I5041 Acute combined systolic (congestive) and diastolic (congestive) heart failure: Secondary | ICD-10-CM | POA: Diagnosis not present

## 2021-12-12 DIAGNOSIS — I480 Paroxysmal atrial fibrillation: Secondary | ICD-10-CM | POA: Diagnosis not present

## 2021-12-12 DIAGNOSIS — I2510 Atherosclerotic heart disease of native coronary artery without angina pectoris: Secondary | ICD-10-CM | POA: Diagnosis not present

## 2021-12-12 DIAGNOSIS — J9601 Acute respiratory failure with hypoxia: Secondary | ICD-10-CM | POA: Diagnosis not present

## 2021-12-12 DIAGNOSIS — I10 Essential (primary) hypertension: Secondary | ICD-10-CM | POA: Diagnosis not present

## 2021-12-12 LAB — LEGIONELLA PNEUMOPHILA SEROGP 1 UR AG: L. pneumophila Serogp 1 Ur Ag: NEGATIVE

## 2021-12-12 LAB — CBC
HCT: 26.3 % — ABNORMAL LOW (ref 39.0–52.0)
Hemoglobin: 8.3 g/dL — ABNORMAL LOW (ref 13.0–17.0)
MCH: 30.1 pg (ref 26.0–34.0)
MCHC: 31.6 g/dL (ref 30.0–36.0)
MCV: 95.3 fL (ref 80.0–100.0)
Platelets: 261 K/uL (ref 150–400)
RBC: 2.76 MIL/uL — ABNORMAL LOW (ref 4.22–5.81)
RDW: 15 % (ref 11.5–15.5)
WBC: 8.1 K/uL (ref 4.0–10.5)
nRBC: 0 % (ref 0.0–0.2)

## 2021-12-12 LAB — BASIC METABOLIC PANEL
Anion gap: 8 (ref 5–15)
BUN: 35 mg/dL — ABNORMAL HIGH (ref 8–23)
CO2: 28 mmol/L (ref 22–32)
Calcium: 8.1 mg/dL — ABNORMAL LOW (ref 8.9–10.3)
Chloride: 103 mmol/L (ref 98–111)
Creatinine, Ser: 1.92 mg/dL — ABNORMAL HIGH (ref 0.61–1.24)
GFR, Estimated: 35 mL/min — ABNORMAL LOW (ref >60)
Glucose, Bld: 114 mg/dL — ABNORMAL HIGH (ref 70–99)
Potassium: 4 mmol/L (ref 3.5–5.1)
Sodium: 139 mmol/L (ref 135–145)

## 2021-12-12 LAB — MAGNESIUM: Magnesium: 2.1 mg/dL (ref 1.7–2.4)

## 2021-12-12 MED ORDER — FUROSEMIDE 10 MG/ML IJ SOLN
60.0000 mg | Freq: Two times a day (BID) | INTRAMUSCULAR | Status: AC
  Administered 2021-12-12 – 2021-12-15 (×7): 60 mg via INTRAVENOUS
  Filled 2021-12-12 (×8): qty 6

## 2021-12-12 MED ORDER — EMPAGLIFLOZIN 10 MG PO TABS
10.0000 mg | ORAL_TABLET | Freq: Every day | ORAL | Status: DC
  Administered 2021-12-13 – 2021-12-15 (×3): 10 mg via ORAL
  Filled 2021-12-12 (×4): qty 1

## 2021-12-12 MED ORDER — METOPROLOL SUCCINATE ER 25 MG PO TB24
25.0000 mg | ORAL_TABLET | Freq: Every day | ORAL | Status: DC
  Administered 2021-12-13 – 2021-12-16 (×4): 25 mg via ORAL
  Filled 2021-12-12 (×4): qty 1

## 2021-12-12 MED ORDER — LOSARTAN POTASSIUM 50 MG PO TABS
50.0000 mg | ORAL_TABLET | Freq: Every day | ORAL | Status: DC
  Administered 2021-12-13 – 2021-12-15 (×3): 50 mg via ORAL
  Filled 2021-12-12 (×4): qty 1

## 2021-12-12 NOTE — Care Management Important Message (Signed)
Important Message  Patient Details  Name: Caleb Wolf MRN: [id number] Date of Birth: 10-08-1940   Medicare Important Message Given:  N/A - LOS <3 / Initial given by admissions     Tommy Medal 12/12/2021, 2:48 PM

## 2021-12-12 NOTE — Care Management Important Message (Signed)
Important Message  Patient Details  Name: Caleb Wolf MRN: [id number] Date of Birth: [birth year]   Medicare Important Message Given:  Yes     Tommy Medal 12/12/2021, 4:03 PM

## 2021-12-12 NOTE — Consult Note (Signed)
Cardiology Consultation:   Patient ID: Caleb Wolf MRN: [id number]; DOB: [birth year]  Admit date: 12/10/2021 Date of Consult: 12/12/2021  PCP:  Asencion Noble, MD   [facility] Providers Cardiologist:  Candee Furbish, MD  Electrophysiologist:  Thompson Grayer, MD       Patient Profile:   Caleb Wolf is a 81 y.o. 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 Roger Shelter.  History of Present Illness:   Mr. Stefanko was last examined by Dr. Marlou Porch in 07/2021 and denied any recent chest pain or palpitations. Reported low-energy despite being in NSR but symptoms had improved with dose reduction of Cardizem CD from '240mg'$  daily to '120mg'$  daily. Was continued on Amiodarone '200mg'$  daily and Xarelto for anticoagulation.   He presented to [facility] ED on 12/10/2021 for evaluation of worsening dyspnea after returning from vacation. In talking with the patient today, he reports he just returned from a trip as he flew to [place] and stayed there for several days then went to [place] for a cruise. He felt well while on the trip but upon returning home developed worsening dyspnea and was short of breath minimal activity, such as walking to the restroom. Reports having a productive cough as well. He does have chronic lower extremity edema along his left leg and is unsure if this has worsened. Reports he likely consumed more salt while on vacation and he did not take his Lasix for several days due to the frequent urination with this. He denies any exertional chest pain or recent palpitations resembling his prior atrial fibrillation.  Initial labs showed WBC 8.3, Hgb 8.6 (previously 11.4 in 06/2021), platelets  248, Na+ 139, K+ 4.2 and creatinine 1.99 (baseline 1.7 - 1.8). BNP 521. Initial and repeat Hs Troponin flat at 24 and 25. Procalcitonin < 0.10. CXR showed a patchy consolidation in the lungs concerning for PNA. CTA showed no evidence of a PE but was noted to have moderate bilateral pleural effusions and bilateral ground-glass opacities in the upper lobes concerning for multifocal PNA. EKG shows NSR, HR 71 with no acute ST changes.  He was admitted and started on Rocephin and Azithromycin for PNA and Lasix '40mg'$  BID for diuresis (on PO Lasix '40mg'$  daily prior to admission). Antibiotics were stopped yesterday due to negative Procalcitonin. I&O's thus far are -1.9 L and weight has declined from 239 lbs to 229 lbs. Weight was at 237 lbs during his office visit in 07/2021. Repeat echo yesterday showed his EF was similar to prior imaging with EF at 45-50% with global HK. RV function normal and PASP moderately elevated to 53.7 mmHg. Trivial MR but no significant valve abnormalities.   Past Medical History:  Diagnosis Date   AAA (abdominal aortic aneurysm) (HCC)    Arthritis    Basal cell carcinoma    Carotid stenosis    Coronary artery disease    Multvessel s/p CABG 2015   Enlarged prostate    Essential hypertension    GERD (gastroesophageal reflux disease)    Gout    History of colon polyps    History of kidney stones    Hyperlipidemia    Lumbar disc disease  Persistent atrial fibrillation ([place])    Sleep apnea    CPAP    Past Surgical History:  Procedure Laterality Date   ABLATION OF DYSRHYTHMIC FOCUS  07/28/2017   ATRIAL FIBRILLATION ABLATION N/A 07/28/2017   Procedure: ATRIAL FIBRILLATION ABLATION;  Surgeon: Thompson Grayer, MD;  Location: [place] [facility];  Service: Cardiovascular;  Laterality: N/A;   ATRIAL FIBRILLATION ABLATION N/A 12/04/2020   Procedure: ATRIAL FIBRILLATION ABLATION;  Surgeon: Thompson Grayer, MD;  Location: [place] [facility];  Service: Cardiovascular;  Laterality: N/A;    BACK SURGERY     CARDIAC CATHETERIZATION  05/22/2014   Procedure: IABP INSERTION;  Surgeon: Leonie Man, MD;  Location: [facility] CATH LAB;  Service: Cardiovascular;;   CARDIOVERSION N/A 04/29/2017   Procedure: CARDIOVERSION;  Surgeon: Satira Sark, MD;  [facility];  Service: Cardiovascular;  Laterality: N/A;   CARDIOVERSION N/A 08/13/2017   Procedure: CARDIOVERSION;  Surgeon: Pixie Casino, MD;  Location: [place];  Service: Cardiovascular;  Laterality: N/A;   CARDIOVERSION N/A 08/15/2019   Procedure: CARDIOVERSION;  Surgeon: Dorothy Spark, MD;  Location: [facility] ENDOSCOPY;  Service: Cardiovascular;  Laterality: N/A;   CARDIOVERSION N/A 03/09/2020   Procedure: CARDIOVERSION;  Surgeon: Buford Dresser, MD;  Location: [place];  Service: Cardiovascular;  Laterality: N/A;   CARDIOVERSION N/A 07/06/2020   Procedure: CARDIOVERSION;  Surgeon: Sanda Klein, MD;  Location: [place];  Service: Cardiovascular;  Laterality: N/A;   CARDIOVERSION N/A 01/18/2021   Procedure: CARDIOVERSION;  Surgeon: Skeet Latch, MD;  Location: [facility] ENDOSCOPY;  Service: Cardiovascular;  Laterality: N/A;   CARDIOVERSION N/A 06/17/2021   Procedure: CARDIOVERSION;  Surgeon: Geralynn Rile, MD;  Location: [place];  Service: Cardiovascular;  Laterality: N/A;   Cataract surgery Right    COLONOSCOPY     COLONOSCOPY N/A 12/30/2017   Procedure: COLONOSCOPY;  Surgeon: Rogene Houston, MD;  [facility];  Service: Endoscopy;  Laterality: N/A;   CORONARY ARTERY BYPASS GRAFT N/A 05/22/2014   Procedure: CORONARY ARTERY BYPASS GRAFTING (CABG) times three using left internal mammary and right saphenous vein.;  Surgeon: Melrose Nakayama, MD;  Location: [place];  Service: Open Heart Surgery;  Laterality: N/A;   ENDARTERECTOMY Left 02/17/2014   Procedure: ENDARTERECTOMY CAROTID WITH PATCH ANGIOPLASTY;  Surgeon: Mal Misty, MD;  Location: [place];  Service: Vascular;  Laterality:  Left;   ESOPHAGEAL DILATION N/A 08/22/2015   Procedure: ESOPHAGEAL DILATION;  Surgeon: Rogene Houston, MD;  [facility];  Service: Endoscopy;  Laterality: N/A;   ESOPHAGOGASTRODUODENOSCOPY N/A 08/22/2015   Procedure: ESOPHAGOGASTRODUODENOSCOPY (EGD);  Surgeon: Rogene Houston, MD;  [facility];  Service: Endoscopy;  Laterality: N/A;  12:45 - moved to 1:55 - Ann notified pt   ESOPHAGOGASTRODUODENOSCOPY N/A 12/30/2017   Procedure: ESOPHAGOGASTRODUODENOSCOPY (EGD);  Surgeon: Rogene Houston, MD;  [facility];  Service: Endoscopy;  Laterality: N/A;  200   EYE SURGERY     cataract extraction (right) with repair macular tear , with IOL     right   FRACTURE SURGERY     bilateral wrist fractures- one ORIF   JOINT REPLACEMENT  2011   left knee   LEFT HEART CATHETERIZATION WITH CORONARY ANGIOGRAM N/A 05/22/2014   Procedure: LEFT HEART CATHETERIZATION WITH CORONARY ANGIOGRAM;  Surgeon: Leonie Man, MD;  Location: [facility] CATH LAB;  Service: Cardiovascular;  Laterality: N/A;   LITHOTRIPSY     PARS PLANA VITRECTOMY W/ REPAIR OF MACULAR HOLE  RHINOPLASTY     TEE WITHOUT CARDIOVERSION N/A 04/29/2017   Procedure: TRANSESOPHAGEAL ECHOCARDIOGRAM (TEE) WITH PROPOFOL;  Surgeon: Satira Sark, MD;  [facility];  Service: Cardiovascular;  Laterality: N/A;   TONSILLECTOMY     TOTAL HIP ARTHROPLASTY  12/08/2011   Procedure: TOTAL HIP ARTHROPLASTY;  Surgeon: Gearlean Alf, MD;  Location: [facility];  Service: Orthopedics;  Laterality: Right;   UPPER GI ENDOSCOPY  12/18/2015   Procedure: UPPER GI ENDOSCOPY;  Surgeon: Ralene Ok, MD;  Location: [facility];  Service: General;;   WRIST SURGERY Right 35yr ago   WRIST SURGERY Left      Home Medications:  Prior to Admission medications   Medication Sig Start Date End Date Taking? Authorizing Provider  acetaminophen (TYLENOL) 500 MG tablet Take 1,000 mg by mouth every 6 (six) hours as needed for moderate pain.   Yes  [provider]  amiodarone (PACERONE) 200 MG tablet Take 1 tablet (200 mg total) by mouth daily. 07/04/21  Yes CSherran Needs NP  amoxicillin (AMOXIL) 500 MG capsule Take 2,000 mg by mouth See admin instructions. Take 4 capsules (2000 mg) by mouth before dental procedures 01/24/20  Yes [provider]  atorvastatin (LIPITOR) 80 MG tablet TAKE 1 TABLET BY MOUTH EVERY EVENING 04/15/21  Yes Allred, JJeneen Rinks MD  colchicine 0.6 MG tablet Take 0.6 mg by mouth daily as needed (for gout flare up).   Yes [provider]  diltiazem (CARDIZEM CD) 120 MG 24 hr capsule TAKE 1 CAPSULE BY MOUTH EVERY DAY 11/04/21  Yes SJerline Pain MD  doxazosin (CARDURA) 4 MG tablet Take 4 mg by mouth in the morning. 07/06/17  Yes [provider]  finasteride (PROSCAR) 5 MG tablet Take 5 mg by mouth every evening.    Yes [provider]  fluticasone (FLONASE) 50 MCG/ACT nasal spray Place 1 spray into both nostrils daily as needed for allergies or rhinitis.   Yes [provider]  furosemide (LASIX) 40 MG tablet TAKE 1 TABLET BY MOUTH TWICE A DAY Patient taking differently: Take 40 mg by mouth daily. If needed can take an additional tablet 05/16/21  Yes CSherran Needs NP  lisinopril (PRINIVIL,ZESTRIL) 40 MG tablet Take 40 mg by mouth in the morning.   Yes [provider]  nitroGLYCERIN (NITROSTAT) 0.4 MG SL tablet Place 1 tablet (0.4 mg total) under the tongue every 5 (five) minutes x 3 doses as needed for chest pain. 06/04/20  Yes Allred, JJeneen Rinks MD  pantoprazole (PROTONIX) 40 MG tablet Take 1 tablet (40 mg total) by mouth daily before breakfast. Patient taking differently: Take 40 mg by mouth every evening. 02/19/16  Yes Rehman, NMechele Dawley MD  furosemide (LASIX) 40 MG tablet Take 1 tablet by mouth 2 (two) times daily.    [provider]  rivaroxaban (XARELTO) 20 MG TABS tablet Take 1 tablet (20 mg total) by mouth daily with supper. Patient taking differently: Take  20 mg by mouth daily. 05/31/21   CSherran Needs NP    Inpatient Medications: Scheduled Meds:  amiodarone  200 mg Oral Daily   atorvastatin  80 mg Oral QPM   diltiazem  120 mg Oral Daily   doxazosin  4 mg Oral q AM   finasteride  5 mg Oral QPM   furosemide  40 mg Intravenous Q12H   levalbuterol  1.25 mg Nebulization TID   lisinopril  40 mg Oral q AM   pantoprazole  40 mg Oral QPM  rivaroxaban  15 mg Oral Q supper   Continuous Infusions:  PRN Meds: guaiFENesin, hydrALAZINE, metoprolol tartrate, senna-docusate, traZODone  Allergies:    Allergies  Allergen Reactions   Codeine Sulfate Nausea Only    Social History:   Social History   Socioeconomic History   Marital status: Married    Spouse name: Not on file   Number of children: Not on file   Years of education: Not on file   Highest education level: Not on file  Occupational History   Occupation: retired    Comment: [employer]  Tobacco Use   Smoking status: Former    Packs/day: 1.50    Years: 25.00    Total pack years: 37.50    Types: Cigarettes    Start date: 08/08/1960    Quit date: 06/02/1982    Years since quitting: 39.5   Smokeless tobacco: Never   Tobacco comments:    quit smoking 30+yrs ago  Vaping Use   Vaping Use: Never used  Substance and Sexual Activity   Alcohol use: No    Alcohol/week: 0.0 standard drinks of alcohol    Comment: occasionally    Drug use: No   Sexual activity: Yes  Other Topics Concern   Not on file  Social History Narrative   Not on file   Social Determinants of Health   Financial Resource Strain: Not on file  Food Insecurity: Not on file  Transportation Needs: Not on file  Physical Activity: Not on file  Stress: Not on file  Social Connections: Not on file  Intimate Partner Violence: Not on file    Family History:    Family History  Problem Relation Age of Onset   Allergies Mother    Heart disease Mother    Hypertension Mother    Heart disease  Father        MI     ROS:  Please see the history of present illness.   All other ROS reviewed and negative.     Physical Exam/Data:   Vitals:   12/11/21 2141 12/12/21 0500 12/12/21 0514 12/12/21 0845  BP: (!) 126/49  (!) 133/52   Pulse: 67  63   Resp: 20  19   Temp: 98.3 F (36.8 C)  98.5 F (36.9 C)   TempSrc:      SpO2: 93%  95% 95%  Weight:  104.2 kg    Height:        Intake/Output Summary (Last 24 hours) at 12/12/2021 0935 Last data filed at 12/12/2021 0900 Gross per 24 hour  Intake 480 ml  Output 700 ml  Net -220 ml      12/12/2021    5:00 AM 12/11/2021    3:59 AM 12/10/2021    9:50 PM  Last 3 Weights  Weight (lbs) 229 lb 11.5 oz 228 lb 2.8 oz 239 lb 13.8 oz  Weight (kg) 104.2 kg 103.5 kg 108.8 kg     Body mass index is 33.92 kg/m.  General:  Well nourished, well developed male appearing in no acute distress HEENT: normal Neck: no JVD Vascular: No carotid bruits; Distal pulses 2+ bilaterally Cardiac:  normal S1, S2; RRR; no murmur.  Lungs: decreased breath sounds along bases bilaterally.  Abd: soft, nontender, no hepatomegaly  Ext: 1+ pitting edema along LLE, trace edema along RLE.  Musculoskeletal:  No deformities, BUE and BLE strength normal and equal Skin: warm and dry  Neuro:  CNs 2-12 intact, no focal abnormalities noted Psych:  Normal affect   EKG:  The EKG was personally reviewed and demonstrates: NSR, HR 71 with no acute ST changes.  Telemetry:  Telemetry was personally reviewed and demonstrates:  NSR, HR in 60's to 70's.   Relevant CV Studies:  Echocardiogram: 07/2021 IMPRESSIONS     1. Left ventricular ejection fraction, by estimation, is 50 to 55%. The  left ventricle has low normal function. The left ventricle has no regional  wall motion abnormalities. Left ventricular diastolic function could not  be evaluated. There is the  interventricular septum is flattened in systole, consistent with right  ventricular pressure overload.   2.  Right ventricular systolic function is normal. The right ventricular  size is normal. There is moderately elevated pulmonary artery systolic  pressure. The estimated right ventricular systolic pressure is 30.1 mmHg.   3. Left atrial size was severely dilated.   4. Right atrial size was severely dilated.   5. The mitral valve is abnormal. Trivial mitral valve regurgitation.   6. The aortic valve is tricuspid. Aortic valve regurgitation is not  visualized. Aortic valve sclerosis is present, with no evidence of aortic  valve stenosis.   7. The inferior vena cava is dilated in size with <50% respiratory  variability, suggesting right atrial pressure of 15 mmHg.   Comparison(s): Changes from prior study are noted. 12/19/2019: LVDF 50-55%,  RVSP 48 mmHg.   Echocardiogram: 12/11/2021 IMPRESSIONS     1. Septal movement consistent with postop state. Left ventricular  ejection fraction, by estimation, is 45 to 50%. The left ventricle has  mildly decreased function. The left ventricle demonstrates global  hypokinesis. Left ventricular diastolic function  could not be evaluated.   2. Right ventricular systolic function is normal. The right ventricular  size is mildly enlarged. There is moderately elevated pulmonary artery  systolic pressure. The estimated right ventricular systolic pressure is  60.1 mmHg.   3. Left atrial size was moderately dilated.   4. Right atrial size was mild to moderately dilated.   5. The mitral valve is grossly normal. Trivial mitral valve  regurgitation. No evidence of mitral stenosis.   6. The aortic valve is tricuspid. Aortic valve regurgitation is not  visualized. Aortic valve sclerosis is present, with no evidence of aortic  valve stenosis.   7. The inferior vena cava is dilated in size with >50% respiratory  variability, suggesting right atrial pressure of 8 mmHg.   Comparison(s): No significant change from prior study.   Laboratory Data:  High Sensitivity  Troponin:   Recent Labs  Lab 12/10/21 1700 12/10/21 1927  TROPONINIHS 24* 25*     Chemistry Recent Labs  Lab 12/10/21 1700 12/11/21 0536 12/12/21 0509  NA 139 141 139  K 4.2 4.2 4.0  CL 106 103 103  CO2 '25 28 28  '$ GLUCOSE 111* 112* 114*  BUN 39* 38* 35*  CREATININE 1.99* 1.93* 1.92*  CALCIUM 8.3* 8.5* 8.1*  MG  --   --  2.1  GFRNONAA 34* 35* 35*  ANIONGAP '8 10 8    '$ Recent Labs  Lab 12/10/21 1700 12/11/21 0536  PROT 5.9* 5.9*  ALBUMIN 2.8* 2.8*  AST 18 16  ALT 28 24  ALKPHOS 95 96  BILITOT 1.2 1.0   Lipids No results for input(s): "CHOL", "TRIG", "HDL", "LABVLDL", "LDLCALC", "CHOLHDL" in the last 168 hours.  Hematology Recent Labs  Lab 12/10/21 1700 12/11/21 0536 12/12/21 0509  WBC 8.3 8.0 8.1  RBC 2.88* 2.91* 2.76*  HGB 8.6* 8.7* 8.3*  HCT 27.4* 28.1* 26.3*  MCV 95.1 96.6 95.3  MCH 29.9 29.9 30.1  MCHC 31.4 31.0 31.6  RDW 14.8 14.8 15.0  PLT 248 242 261   Thyroid No results for input(s): "TSH", "FREET4" in the last 168 hours.  BNP Recent Labs  Lab 12/10/21 1700  BNP 521.0*    DDimer No results for input(s): "DDIMER" in the last 168 hours.   Radiology/Studies:    CT Angio Chest PE W and/or Wo Contrast  Result Date: 12/10/2021 CLINICAL DATA:  Pulmonary embolism (PE) suspected, high prob. Shortness of breath. Cough. Chest pain. EXAM: CT ANGIOGRAPHY CHEST WITH CONTRAST TECHNIQUE: Multidetector CT imaging of the chest was performed using the standard protocol during bolus administration of intravenous contrast. Multiplanar CT image reconstructions and MIPs were obtained to evaluate the vascular anatomy. RADIATION DOSE REDUCTION: This exam was performed according to the departmental dose-optimization program which includes automated exposure control, adjustment of the mA and/or kV according to patient size and/or use of iterative reconstruction technique. CONTRAST:  40m OMNIPAQUE IOHEXOL 350 MG/ML SOLN COMPARISON:  11/27/2020 FINDINGS: Cardiovascular:  Heart is mildly enlarged. No filling defects in the pulmonary arteries to suggest pulmonary emboli. Prior CABG. Aortic atherosclerosis. No aneurysm. Mediastinum/Nodes: No mediastinal, hilar, or axillary adenopathy. Trachea and esophagus are unremarkable. Thyroid unremarkable. Lungs/Pleura: Moderate bilateral pleural effusions. Compressive atelectasis in the right lower lobe. Extensive ground-glass airspace disease in both lungs, most pronounced in the upper lobes concerning for pneumonia. Upper Abdomen: No acute findings Musculoskeletal: Chest wall soft tissues are unremarkable. No acute bony abnormality. Review of the MIP images confirms the above findings. IMPRESSION: No evidence of pulmonary embolus. Moderate bilateral pleural effusions. Bilateral ground-glass airspace opacities most pronounced in the upper lobes, left greater than right. Findings concerning for multifocal pneumonia. Aortic Atherosclerosis (ICD10-I70.0). Electronically Signed   By: KRolm BaptiseM.D.   On: 12/10/2021 19:28   DG Chest 2 View  Result Date: 12/10/2021 CLINICAL DATA:  Shortness of breath EXAM: CHEST - 2 VIEW COMPARISON:  November 20, 2017 FINDINGS: The heart size and mediastinal contours are stable. Patchy consolidation is identified throughout bilateral lungs. Small bilateral pleural effusions are noted. The visualized skeletal structures are stable. IMPRESSION: Patchy consolidation identified throughout bilateral lungs, suspicious for pneumonia. Electronically Signed   By: WAbelardo DieselM.D.   On: 12/10/2021 09:52     Assessment and Plan:   1. Acute HFmrEF - He has a known cardiomyopathy dating back to 2018 with EF 40-45% at that time and EF was previously at 50 to 55% in 07/2021. Repeat echocardiogram this admission overall similar with EF at 45 to 50% with global hypokinesis. - He was initially being treated for PNA but it was felt symptoms were more consistent with CHF given normal procalcitonin. Suspect the likely  etiology of his acute CHF exacerbation is due to dietary indiscretion and missing doses of Lasix while on vacation. - Recorded output of -1.9L thus far and weight listed as having declined by 10 lbs but unsure of the accuracy of this. Will obtain a repeat CXR today and ReDS vest reading. Will increase Lasix to '60mg'$  BID as he appears volume overloaded on examination but reports having chronic lower extremity edema. Still on supplemental oxygen and would try to wean as saturations allow.  - He has been on Cardizem CD for atrial fibrillation and overall tolerated well.  Unclear if intolerant to beta-blockers in the past but given fatigue with Cardizem, suspect this would be worse with beta-blockers. He is on  Lisinopril 40 mg daily and suspect he would benefit from an SGLT2 inhibitor given his cardiomyopathy.  2. CAD - He is s/p CABG in 2015. He was having dyspnea prior to admission but denies any recent exertional chest pain. Troponin values have been flat at 24 and 25. If he continues to have fatigue following diuresis, could consider an outpatient Lexiscan Myoview for repeat ischemic evaluation. - He has been continued on Atorvastatin 80 mg daily. No longer on ASA given the need for anticoagulation.  3. Persistent Atrial Fibrillation - He is s/p ablation in 2019 with repeat in 11/2020. He did have recurrent atrial fibrillation afterwards and underwent DCCV in 06/2021. Maintaining normal sinus rhythm this admission. - He remains on Cardizem CD 120 mg daily and Amiodarone '200mg'$  daily. On Xarelto 15 mg daily (dose reduction this admission secondary to kidney function).  4. HTN - BP has overall been well controlled, at 120/46 - 133/52 within the past 24 hours. Continue Lisinopril.   5. Stage 3 CKD - Baseline 1.7 - 1.8. Elevated at 1.99 on admission and overall stable at 1.92 today. Continue to follow with diuresis.    For questions or updates, please contact [place] Please consult [email or web address]  for contact info under    Signed, Erma Heritage, PA-C  12/12/2021 9:35 AM

## 2021-12-12 NOTE — Evaluation (Signed)
Occupational Therapy Evaluation Patient Details Name: Caleb Wolf MRN: [id number] DOB: [birth year] Today's Date: 12/12/2021   History of Present Illness 81 y.o. male with medical history significant of coronary artery disease, essential hypertension, GERD, hyperlipidemia, atrial fibrillation, and more presents ED with a chief complaint of dyspnea. Patient reported of bilateral leg swelling and dyspnea on exertion ongoing for the past 1-2 weeks.  CTA chest negative, shows moderate bilateral pleural effusions with concerns for multifocal pneumonia.  COVID test is negative, started on IV Rocephin and azithromycin.  But later his symptoms were thought to be due to CHF exacerbation therefore antibiotics discontinued.   Clinical Impression   Pt agreeable to OT evaluation. Pt appears to be near baseline functional levels for ADL's with primary limiting factor being SOB. Pt was taken off supplemental O2 while seated and desaturated to 85% SpO2 after doffing and donning a sock. When ambulating in room pt desaturated to a low of 79% SpO2. Pt returned to EOB and was placed back on 2 L supplemental O2 and returned to 92%. During this time pt did not report feeling SOB. Pt educated on pursed lip breathing with good carryover. Pt is not recommended for further acute OT services and will be discharged to care of nursing staff for remaining length of stay.       Recommendations for follow up therapy are one component of a multi-disciplinary discharge planning process, led by the attending physician.  Recommendations may be updated based on patient status, additional functional criteria and insurance authorization.   Follow Up Recommendations  No OT follow up    Assistance Recommended at Discharge PRN  Patient can return home with the following      Functional Status Assessment  Patient has not had a recent decline in their functional status  Equipment Recommendations  None recommended by OT     Recommendations for Other Services       Precautions / Restrictions Precautions Precautions: Fall Restrictions Weight Bearing Restrictions: No      Mobility Bed Mobility Overal bed mobility: Modified Independent             General bed mobility comments: HOB elevated when pt went from supine to sit without assist.    Transfers Overall transfer level: Needs assistance Equipment used: Straight cane Transfers: Sit to/from Stand, Bed to chair/wheelchair/BSC Sit to Stand: Modified independent (Device/Increase time), Supervision     Step pivot transfers: Modified independent (Device/Increase time), Supervision     General transfer comment: Able to ambulate in room without assist and use of SPC.      Balance Overall balance assessment: History of Falls   Sitting balance-Leahy Scale: Good Sitting balance - Comments: sitting at EOB   Standing balance support: Single extremity supported, During functional activity Standing balance-Leahy Scale: Fair Standing balance comment: fair/good with SPC                           ADL either performed or assessed with clinical judgement   ADL Overall ADL's : Modified independent;Independent                                       General ADL Comments: Using cane for toilet transfer without physical assist. Good dressing seated on EOB.     Vision Baseline Vision/History: 1 Wears glasses Ability to See in Adequate Light: 1 Impaired Patient  Visual Report: No change from baseline Vision Assessment?: No apparent visual deficits                Pertinent Vitals/Pain Pain Assessment Pain Assessment: No/denies pain     Hand Dominance Right   Extremity/Trunk Assessment Upper Extremity Assessment Upper Extremity Assessment: Overall WFL for tasks assessed   Lower Extremity Assessment Lower Extremity Assessment: Defer to PT evaluation   Cervical / Trunk Assessment Cervical / Trunk Assessment:  Normal   Communication Communication Communication: No difficulties   Cognition Arousal/Alertness: Awake/alert Behavior During Therapy: WFL for tasks assessed/performed Overall Cognitive Status: Within Functional Limits for tasks assessed                                                        Home Living Family/patient expects to be discharged to:: Private residence Living Arrangements: Spouse/significant other Available Help at Discharge: Available 24 hours/day;Family Type of Home: House Home Access: Level entry     Home Layout: One level     Bathroom Shower/Tub: Occupational psychologist: Handicapped height Bathroom Accessibility: No   Home Equipment: Rollator (4 wheels);Cane - single point;BSC/3in1;Hand held shower head   Additional Comments: taken via PT note      Prior Functioning/Environment Prior Level of Function : Independent/Modified Independent             Mobility Comments: community distances with [place]; did use Rollator and scooter on recent vacation trip with a lot of walking ADLs Comments: Independent with dressing. No reports of assist for other skills.                      OT Goals(Current goals can be found in the care plan section) Acute Rehab OT Goals Patient Stated Goal: Return home  OT Frequency:                                     End of Session Equipment Utilized During Treatment: Other (comment) (SPC)  Activity Tolerance: Patient tolerated treatment well Patient left: in bed;with call bell/phone within reach  OT Visit Diagnosis: Unsteadiness on feet (R26.81);Other abnormalities of gait and mobility (R26.89);Other (comment) (SOB)                Time: 6384-6659 OT Time Calculation (min): 14 min Charges:  OT General Charges $OT Visit: 1 Visit OT Evaluation $OT Eval Low Complexity: 1 Low  Satara Virella OT, MOT  Larey Seat 12/12/2021, 9:47 AM

## 2021-12-12 NOTE — Progress Notes (Signed)
SATURATION QUALIFICATIONS: (This note is used to comply with regulatory documentation for home oxygen)  Patient Saturations on Room Air at Rest = 82%  Patient Saturations on Room Air while Ambulating = 80%  Patient Saturations on 3 Liters of oxygen while Ambulating = 92%  Please briefly explain why patient needs home oxygen: Patient desaturates below 88% while ambulating on room air.

## 2021-12-12 NOTE — Progress Notes (Signed)
   12/12/21 1218  ReDS Vest / Clip  Station Marker D  Ruler Value 39.5  ReDS Value Range (!) > 40  ReDS Actual Value 45

## 2021-12-12 NOTE — Progress Notes (Signed)
PROGRESS NOTE    Caleb Wolf  HQI:[id number] DOB: 24-Mar-1941 DOA: 12/10/2021 PCP: Asencion Noble, MD   Brief Narrative:  81 y.o. male with medical history significant of coronary artery disease, essential hypertension, GERD, hyperlipidemia, atrial fibrillation, and more presents ED with a chief complaint of dyspnea. Patient reported of bilateral leg swelling and dyspnea on exertion ongoing for the past 1-2 weeks.  CTA chest negative, shows moderate bilateral pleural effusions with concerns for multifocal pneumonia.  COVID test is negative, started on IV Rocephin and azithromycin.  But later his symptoms were thought to be due to CHF exacerbation therefore antibiotics discontinued.     Subjective: The patient was seen and examined this morning, stable mild shortness of breath and still on 2 L of oxygen via nasal cannula satting 95% Still complaining of mild shortness of breath, with a extensive bilateral lower extremity edema 104.2 kg (Not O2 dependent at home)   Cardiology consulted,  ReDS Clip reading> 40    Assessment & Plan:  Principal Problem:   Pneumonia Active Problems:   OSA (obstructive sleep apnea)   HTN (hypertension)   GERD (gastroesophageal reflux disease)   Acute CHF (HCC)   Paroxysmal atrial fibrillation (HCC)   CAD (coronary artery disease)   Acute respiratory failure with hypoxia (HCC)     Assessment and Plan:  Acute respiratory failure with hypoxia (HCC) Bilateral pleural effusion -Still requiring 1.5-2 L of oxygen by nasal cannula, satting 95% -Not O2 dependent at home -There is suspicion for multifocal pneumonia versus CHF exacerbation.   - Procalcitonin level is negative therefore I will discontinue antibiotics.   -BNP is elevated,  -ReDS Clip reading > 40 - continue diuretics.  Wean off oxygen as appropriate. -CTA chest is negative for pulmonary embolism -Continue IV Lasix, per protocol, -Cardiologist consulted, appreciate input -2D echocardiac  reviewed,Septal movement consistent with postop state. Left ventricular ejection fraction, by estimation, is 45 to 50%  Multifocal infiltrate - COVID-19 is negative.  I suspect his multifocal infiltrates are secondary to fluid overload.  Procalcitonin is negative.  Stop antibiotics.  Acute diastolic congestive heart failure, class III --We will start patient on diuretics Lasix 40 mg IV twice daily. - Echocardiogram February 2021-EF 55%, interventricular septal flattening, elevated RV pressures, dilated cardiomyopathy -Repeat 7/12 Echo: Septal movement consistent with postop state. Left ventricular ejection fraction, by estimation, is 45 to 50% -Reds clip read > 40 - Cardiology consulted .Marland Kitchen  Appreciate follow-up  CKD stage IIIb - Baseline creatinine around 1.8. -Creatinine 1.93, 1.92 -  Continue to monitor as patient is on lisinopril and getting IV diuretics.  CAD (coronary artery disease) -Currently patient remains chest pain-free.  On Xarelto, statin, lisinopril, Cardizem  Paroxysmal atrial fibrillation (HCC) -Continue amiodarone, Cardizem, Xarelto   GERD (gastroesophageal reflux disease) -Protonix  HTN (hypertension) -Continue home medications  OSA (obstructive sleep apnea) -CPAP   DVT prophylaxis: Xarelto Code Status: Full code Family Communication: Wife present at bedside       Objective: Vitals:   12/11/21 2141 12/12/21 0500 12/12/21 0514 12/12/21 0845  BP: (!) 126/49  (!) 133/52   Pulse: 67  63   Resp: 20  19   Temp: 98.3 F (36.8 C)  98.5 F (36.9 C)   TempSrc:      SpO2: 93%  95% 95%  Weight:  104.2 kg    Height:        Intake/Output Summary (Last 24 hours) at 12/12/2021 1255 Last data filed at 12/12/2021 0900 Gross per 24  hour  Intake 240 ml  Output --  Net 240 ml     Filed Weights   12/10/21 2150 12/11/21 0359 12/12/21 0500  Weight: 108.8 kg 103.5 kg 104.2 kg     Physical Exam:   General:  AAO x 3,  cooperative, no distress; mild  shortness of breath with 1.1 L of O2 via nasal cannula  HEENT:  Normocephalic, PERRL, otherwise with in Normal limits   Neuro:  CNII-XII intact. , normal motor and sensation, reflexes intact   Lungs:   Clear to auscultation BL, Respirations unlabored,  No wheezes / crackles  Cardio:    S1/S2, RRR, No murmure, No Rubs or Gallops   Abdomen:  Soft, non-tender, bowel sounds active all four quadrants, no guarding or peritoneal signs.  Muscular  skeletal:  Limited exam -global generalized weaknesses - in bed, able to move all 4 extremities,   2+ pulses,  symmetric, +++3 pitting edema  Skin:  Dry, warm to touch, negative for any Rashes,  Wounds: Please see nursing documentation           Data Reviewed:   CBC: Recent Labs  Lab 12/10/21 1700 12/11/21 0536 12/12/21 0509  WBC 8.3 8.0 8.1  NEUTROABS 6.7  --   --   HGB 8.6* 8.7* 8.3*  HCT 27.4* 28.1* 26.3*  MCV 95.1 96.6 95.3  PLT 248 242 283   Basic Metabolic Panel: Recent Labs  Lab 12/10/21 1700 12/11/21 0536 12/12/21 0509  NA 139 141 139  K 4.2 4.2 4.0  CL 106 103 103  CO2 '25 28 28  '$ GLUCOSE 111* 112* 114*  BUN 39* 38* 35*  CREATININE 1.99* 1.93* 1.92*  CALCIUM 8.3* 8.5* 8.1*  MG  --   --  2.1   GFR: Estimated Creatinine Clearance: 37.1 mL/min (A) (by C-G formula based on SCr of 1.92 mg/dL (H)). Liver Function Tests: Recent Labs  Lab 12/10/21 1700 12/11/21 0536  AST 18 16  ALT 28 24  ALKPHOS 95 96  BILITOT 1.2 1.0  PROT 5.9* 5.9*  ALBUMIN 2.8* 2.8*    Sepsis Labs: Recent Labs  Lab 12/11/21 0536  PROCALCITON <0.10    No results found for this or any previous visit (from the past 240 hour(s)).       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 Mediastinal contour stable. Diffuse BILATERAL pulmonary  infiltrates greater in the upper lobes favoring pneumonia. Small bibasilar effusions. No pneumothorax. IMPRESSION: Persistent BILATERAL pulmonary infiltrates with upper lobe predominance consistent with pneumonia. Electronically Signed   By: Lavonia Dana [name]   On: 12/12/2021 10:56   ECHOCARDIOGRAM COMPLETE  Result Date: 12/11/2021    ECHOCARDIOGRAM REPORT   Patient Name:   Caleb Wolf Date of Exam: 12/11/2021 Medical Rec #:  [id number]    Height:       69.0 in Accession #:    [id number]   Weight:       228.2 lb Date of Birth:  [birth year]     BSA:          2.185 m Patient Age:    27 years     BP:           125/58 mmHg Patient Gender: M            HR:  70 bpm. Exam Location:  [facility] Procedure: 2D Echo, Cardiac Doppler and Color Doppler Indications:    CHF  History:        Patient has prior history of Echocardiogram examinations, most                 recent 07/23/2021. CHF, Previous Myocardial Infarction and CAD,                 Abnormal ECG and Prior CABG, Arrythmias:Atrial Fibrillation,                 Signs/Symptoms:Chest Pain and Edema; Risk Factors:Hypertension,                 Dyslipidemia and Former Smoker.  Sonographer:    Wenda Low Referring Phys: [id number] ASIA B [place]  1. Septal movement consistent with postop state. Left ventricular ejection fraction, by estimation, is 45 to 50%. The left ventricle has mildly decreased function. The left ventricle demonstrates global hypokinesis. Left ventricular diastolic function could not be evaluated.  2. Right ventricular systolic function is normal. The right ventricular size is mildly enlarged. There is moderately elevated pulmonary artery systolic pressure. The estimated right ventricular systolic pressure is 95.1 mmHg.  3. Left atrial size was moderately dilated.  4. Right atrial size was mild to moderately dilated.  5. The mitral valve is grossly normal. Trivial mitral valve regurgitation. No evidence of mitral stenosis.  6.  The aortic valve is tricuspid. Aortic valve regurgitation is not visualized. Aortic valve sclerosis is present, with no evidence of aortic valve stenosis.  7. The inferior vena cava is dilated in size with >50% respiratory variability, suggesting right atrial pressure of 8 mmHg. Comparison(s): No significant change from prior study. FINDINGS  Left Ventricle: Septal movement consistent with postop state. Left ventricular ejection fraction, by estimation, is 45 to 50%. The left ventricle has mildly decreased function. The left ventricle demonstrates global hypokinesis. The left ventricular internal cavity size was normal in size. There is no left ventricular hypertrophy. Abnormal (paradoxical) septal motion consistent with post-operative status. Left ventricular diastolic function could not be evaluated due to atrial fibrillation. Left ventricular diastolic function could not be evaluated. Right Ventricle: The right ventricular size is mildly enlarged. No increase in right ventricular wall thickness. Right ventricular systolic function is normal. There is moderately elevated pulmonary artery systolic pressure. The tricuspid regurgitant velocity is 3.38 m/s, and with an assumed right atrial pressure of 8 mmHg, the estimated right ventricular systolic pressure is 88.4 mmHg. Left Atrium: Left atrial size was moderately dilated. Right Atrium: Right atrial size was mild to moderately dilated. Pericardium: There is no evidence of pericardial effusion. Mitral Valve: The mitral valve is grossly normal. Trivial mitral valve regurgitation. No evidence of mitral valve stenosis. MV peak gradient, 4.6 mmHg. The mean mitral valve gradient is 1.0 mmHg. Tricuspid Valve: The tricuspid valve is grossly normal. Tricuspid valve regurgitation is mild . No evidence of tricuspid stenosis. Aortic Valve: The aortic valve is tricuspid. Aortic valve regurgitation is not visualized. Aortic valve sclerosis is present, with no evidence of aortic  valve stenosis. Aortic valve mean gradient measures 3.0 mmHg. Aortic valve peak gradient measures 5.9  mmHg. Aortic valve area, by VTI measures 2.85 cm. Pulmonic Valve: The pulmonic valve was grossly normal. Pulmonic valve regurgitation is mild. No evidence of pulmonic stenosis. Aorta: The aortic root and ascending aorta are structurally normal, with no evidence of dilitation. Venous: The inferior vena cava is dilated in size  with greater than 50% respiratory variability, suggesting right atrial pressure of 8 mmHg. IAS/Shunts: The atrial septum is grossly normal.  LEFT VENTRICLE PLAX 2D LVIDd:         5.60 cm      Diastology LVIDs:         3.60 cm      LV e' medial:    7.72 cm/s LV PW:         1.10 cm      LV E/e' medial:  11.2 LV IVS:        0.90 cm      LV e' lateral:   9.90 cm/s LVOT diam:     2.10 cm      LV E/e' lateral: 8.7 LV SV:         79 LV SV Index:   36 LVOT Area:     3.46 cm  LV Volumes (MOD) LV vol d, MOD A2C: 137.0 ml LV vol d, MOD A4C: 120.0 ml LV vol s, MOD A2C: 68.5 ml LV vol s, MOD A4C: 68.2 ml LV SV MOD A2C:     68.5 ml LV SV MOD A4C:     120.0 ml LV SV MOD BP:      60.9 ml RIGHT VENTRICLE RV Basal diam:  3.90 cm RV Mid diam:    2.80 cm RV S prime:     12.60 cm/s TAPSE (M-mode): 2.6 cm LEFT ATRIUM              Index        RIGHT ATRIUM           Index LA diam:        4.70 cm  2.15 cm/m   RA Area:     29.20 cm LA Vol (A2C):   114.0 ml 52.17 ml/m  RA Volume:   103.00 ml 47.14 ml/m LA Vol (A4C):   94.7 ml  43.34 ml/m LA Biplane Vol: 108.0 ml 49.43 ml/m  AORTIC VALVE                    PULMONIC VALVE AV Area (Vmax):    2.69 cm     PV Vmax:       0.92 m/s AV Area (Vmean):   2.69 cm     PV Peak grad:  3.4 mmHg AV Area (VTI):     2.85 cm AV Vmax:           121.00 cm/s AV Vmean:          78.700 cm/s AV VTI:            0.276 m AV Peak Grad:      5.9 mmHg AV Mean Grad:      3.0 mmHg LVOT Vmax:         94.00 cm/s LVOT Vmean:        61.100 cm/s LVOT VTI:          0.227 m LVOT/AV VTI ratio: 0.82   AORTA Ao Root diam: 3.20 cm Ao Asc diam:  3.20 cm MITRAL VALVE               TRICUSPID VALVE MV Area (PHT): 3.27 cm    TR Peak grad:   45.7 mmHg MV Area VTI:   2.26 cm    TR Vmax:        338.00 cm/s MV Peak grad:  4.6 mmHg MV Mean grad:  1.0 mmHg    SHUNTS MV Vmax:  1.07 m/s    Systemic VTI:  0.23 m MV Vmean:      52.6 cm/s   Systemic Diam: 2.10 cm MV Decel Time: 232 msec MV E velocity: 86.50 cm/s MV A velocity: 33.00 cm/s MV E/A ratio:  2.62 Eleonore Chiquito MD Electronically signed by Eleonore Chiquito MD Signature Date/Time: 12/11/2021/4:39:55 PM    Final    CT Angio Chest PE W and/or Wo Contrast  Result Date: 12/10/2021 CLINICAL DATA:  Pulmonary embolism (PE) suspected, high prob. Shortness of breath. Cough. Chest pain. EXAM: CT ANGIOGRAPHY CHEST WITH CONTRAST TECHNIQUE: Multidetector CT imaging of the chest was performed using the standard protocol during bolus administration of intravenous contrast. Multiplanar CT image reconstructions and MIPs were obtained to evaluate the vascular anatomy. RADIATION DOSE REDUCTION: This exam was performed according to the departmental dose-optimization program which includes automated exposure control, adjustment of the mA and/or kV according to patient size and/or use of iterative reconstruction technique. CONTRAST:  46m OMNIPAQUE IOHEXOL 350 MG/ML SOLN COMPARISON:  11/27/2020 FINDINGS: Cardiovascular: Heart is mildly enlarged. No filling defects in the pulmonary arteries to suggest pulmonary emboli. Prior CABG. Aortic atherosclerosis. No aneurysm. Mediastinum/Nodes: No mediastinal, hilar, or axillary adenopathy. Trachea and esophagus are unremarkable. Thyroid unremarkable. Lungs/Pleura: Moderate bilateral pleural effusions. Compressive atelectasis in the right lower lobe. Extensive ground-glass airspace disease in both lungs, most pronounced in the upper lobes concerning for pneumonia. Upper Abdomen: No acute findings Musculoskeletal: Chest wall soft tissues are  unremarkable. No acute bony abnormality. Review of the MIP images confirms the above findings. IMPRESSION: No evidence of pulmonary embolus. Moderate bilateral pleural effusions. Bilateral ground-glass airspace opacities most pronounced in the upper lobes, left greater than right. Findings concerning for multifocal pneumonia. Aortic Atherosclerosis (ICD10-I70.0). Electronically Signed   By: KRolm BaptiseM.D.   On: 12/10/2021 19:28        Scheduled Meds:  amiodarone  200 mg Oral Daily   atorvastatin  80 mg Oral QPM   doxazosin  4 mg Oral q AM   [START ON 12/13/2021] empagliflozin  10 mg Oral Daily   finasteride  5 mg Oral QPM   furosemide  60 mg Intravenous Q12H   levalbuterol  1.25 mg Nebulization TID   [START ON 12/13/2021] losartan  50 mg Oral Daily   [START ON 12/13/2021] metoprolol succinate  25 mg Oral Daily   pantoprazole  40 mg Oral QPM   rivaroxaban  15 mg Oral Q supper   Continuous Infusions:     LOS: 2 days   Time spent= 35 mins    SDeatra James MD [facility]  If 7PM-7AM, please contact night-coverage  12/12/2021, 12:55 PM

## 2021-12-13 ENCOUNTER — Encounter (HOSPITAL_COMMUNITY): Payer: Self-pay | Admitting: Family Medicine

## 2021-12-13 DIAGNOSIS — I2510 Atherosclerotic heart disease of native coronary artery without angina pectoris: Secondary | ICD-10-CM | POA: Diagnosis not present

## 2021-12-13 DIAGNOSIS — I5021 Acute systolic (congestive) heart failure: Secondary | ICD-10-CM | POA: Diagnosis not present

## 2021-12-13 DIAGNOSIS — J9601 Acute respiratory failure with hypoxia: Secondary | ICD-10-CM | POA: Diagnosis not present

## 2021-12-13 DIAGNOSIS — J189 Pneumonia, unspecified organism: Secondary | ICD-10-CM | POA: Diagnosis not present

## 2021-12-13 DIAGNOSIS — R042 Hemoptysis: Secondary | ICD-10-CM

## 2021-12-13 DIAGNOSIS — I480 Paroxysmal atrial fibrillation: Secondary | ICD-10-CM | POA: Diagnosis not present

## 2021-12-13 DIAGNOSIS — I10 Essential (primary) hypertension: Secondary | ICD-10-CM | POA: Diagnosis not present

## 2021-12-13 DIAGNOSIS — I5022 Chronic systolic (congestive) heart failure: Secondary | ICD-10-CM

## 2021-12-13 DIAGNOSIS — N1832 Chronic kidney disease, stage 3b: Secondary | ICD-10-CM | POA: Diagnosis not present

## 2021-12-13 HISTORY — DX: Chronic kidney disease, stage 3b: N18.32

## 2021-12-13 LAB — RESPIRATORY PANEL BY PCR

## 2021-12-13 LAB — CBC
HCT: 28.6 % — ABNORMAL LOW (ref 39.0–52.0)
Hemoglobin: 8.8 g/dL — ABNORMAL LOW (ref 13.0–17.0)
MCH: 29.4 pg (ref 26.0–34.0)
MCHC: 30.8 g/dL (ref 30.0–36.0)
MCV: 95.7 fL (ref 80.0–100.0)
Platelets: 279 10*3/uL (ref 150–400)
RBC: 2.99 MIL/uL — ABNORMAL LOW (ref 4.22–5.81)
RDW: 14.7 % (ref 11.5–15.5)
WBC: 9.4 10*3/uL (ref 4.0–10.5)
nRBC: 0 % (ref 0.0–0.2)

## 2021-12-13 LAB — BASIC METABOLIC PANEL
Anion gap: 10 (ref 5–15)
BUN: 35 mg/dL — ABNORMAL HIGH (ref 8–23)
CO2: 30 mmol/L (ref 22–32)
Calcium: 8.4 mg/dL — ABNORMAL LOW (ref 8.9–10.3)
Chloride: 99 mmol/L (ref 98–111)
Creatinine, Ser: 1.92 mg/dL — ABNORMAL HIGH (ref 0.61–1.24)
GFR, Estimated: 35 mL/min — ABNORMAL LOW (ref >60)
Glucose, Bld: 125 mg/dL — ABNORMAL HIGH (ref 70–99)
Potassium: 4 mmol/L (ref 3.5–5.1)
Sodium: 139 mmol/L (ref 135–145)

## 2021-12-13 LAB — BRAIN NATRIURETIC PEPTIDE: B Natriuretic Peptide: 685 pg/mL — ABNORMAL HIGH (ref 0.0–100.0)

## 2021-12-13 LAB — SEDIMENTATION RATE: Sed Rate: 75 mm/hr — ABNORMAL HIGH (ref 0–16)

## 2021-12-13 MED ORDER — LEVALBUTEROL HCL 1.25 MG/0.5ML IN NEBU: 1.2500 mg | INHALATION_SOLUTION | Freq: Four times a day (QID) | RESPIRATORY_TRACT | Status: DC | PRN

## 2021-12-13 MED ORDER — METHYLPREDNISOLONE SODIUM SUCC 125 MG IJ SOLR
60.0000 mg | Freq: Two times a day (BID) | INTRAMUSCULAR | Status: DC
  Administered 2021-12-13 – 2021-12-15 (×5): 60 mg via INTRAVENOUS
  Filled 2021-12-13 (×5): qty 2

## 2021-12-13 NOTE — Progress Notes (Signed)
Progress Note  Patient Name: Caleb Wolf Date of Encounter: 12/13/2021  [facility] Cardiologist: Candee Furbish, MD   Subjective   Breathing improved but still not at baseline. Saturations dropped to 80% with ambulation on RA yesterday. Having a productive cough. No chest pain or palpitations.   Inpatient Medications    Scheduled Meds:  amiodarone  200 mg Oral Daily   atorvastatin  80 mg Oral QPM   doxazosin  4 mg Oral q AM   empagliflozin  10 mg Oral Daily   finasteride  5 mg Oral QPM   furosemide  60 mg Intravenous Q12H   levalbuterol  1.25 mg Nebulization TID   losartan  50 mg Oral Daily   metoprolol succinate  25 mg Oral Daily   pantoprazole  40 mg Oral QPM   rivaroxaban  15 mg Oral Q supper   Continuous Infusions:  PRN Meds: guaiFENesin, hydrALAZINE, metoprolol tartrate, senna-docusate, traZODone   Vital Signs    Vitals:   12/12/21 2158 12/13/21 0500 12/13/21 0507 12/13/21 0852  BP: (!) 135/44  (!) 143/54   Pulse: 65  73   Resp: 20  20   Temp: 97.8 F (36.6 C)  99.5 F (37.5 C)   TempSrc: Oral  Oral   SpO2: 93%  94% 95%  Weight:  104.9 kg    Height:        Intake/Output Summary (Last 24 hours) at 12/13/2021 0946 Last data filed at 12/13/2021 0500 Gross per 24 hour  Intake 640 ml  Output 2500 ml  Net -1860 ml      12/13/2021    5:00 AM 12/12/2021    5:00 AM 12/11/2021    3:59 AM  Last 3 Weights  Weight (lbs) 231 lb 4.2 oz 229 lb 11.5 oz 228 lb 2.8 oz  Weight (kg) 104.9 kg 104.2 kg 103.5 kg      Telemetry    NSR, HR in 60's to 70's.  - Personally Reviewed  ECG    No new tracings.   Physical Exam   GEN: Pleasant elderly male appearing in no acute distress.   Neck: No JVD Cardiac: RRR, no murmurs, rubs, or gallops.  Respiratory: Decreased breath sounds along bases bilaterally.  GI: Soft, nontender, non-distended  MS: 1+ pitting edema along LLE; No deformity. Compression stockings in place.  Neuro:  Nonfocal  Psych: Normal affect    Labs    High Sensitivity Troponin:   Recent Labs  Lab 12/10/21 1700 12/10/21 1927  TROPONINIHS 24* 25*     Chemistry Recent Labs  Lab 12/10/21 1700 12/11/21 0536 12/12/21 0509  NA 139 141 139  K 4.2 4.2 4.0  CL 106 103 103  CO2 '25 28 28  '$ GLUCOSE 111* 112* 114*  BUN 39* 38* 35*  CREATININE 1.99* 1.93* 1.92*  CALCIUM 8.3* 8.5* 8.1*  MG  --   --  2.1  PROT 5.9* 5.9*  --   ALBUMIN 2.8* 2.8*  --   AST 18 16  --   ALT 28 24  --   ALKPHOS 95 96  --   BILITOT 1.2 1.0  --   GFRNONAA 34* 35* 35*  ANIONGAP '8 10 8    '$ Lipids No results for input(s): "CHOL", "TRIG", "HDL", "LABVLDL", "LDLCALC", "CHOLHDL" in the last 168 hours.  Hematology Recent Labs  Lab 12/10/21 1700 12/11/21 0536 12/12/21 0509  WBC 8.3 8.0 8.1  RBC 2.88* 2.91* 2.76*  HGB 8.6* 8.7* 8.3*  HCT 27.4* 28.1* 26.3*  MCV 95.1 96.6 95.3  MCH 29.9 29.9 30.1  MCHC 31.4 31.0 31.6  RDW 14.8 14.8 15.0  PLT 248 242 261   Thyroid No results for input(s): "TSH", "FREET4" in the last 168 hours.  BNP Recent Labs  Lab 12/10/21 1700  BNP 521.0*    DDimer No results for input(s): "DDIMER" in the last 168 hours.   Radiology    DG Chest 2 View  Result Date: 12/12/2021 CLINICAL DATA:  BILATERAL leg swelling and dyspnea on exertion for 1-2 weeks EXAM: CHEST - 2 VIEW COMPARISON:  12/09/2021 FINDINGS: Upper normal size of cardiac silhouette post CABG. Mediastinal contour stable. Diffuse BILATERAL pulmonary infiltrates greater in the upper lobes favoring pneumonia. Small bibasilar effusions. No pneumothorax. IMPRESSION: Persistent BILATERAL pulmonary infiltrates with upper lobe predominance consistent with pneumonia. Electronically Signed   By: Lavonia Dana [name]   On: 12/12/2021 10:56   Cardiac Studies   Echocardiogram: 12/11/2021 IMPRESSIONS     1. Septal movement consistent with postop state. Left ventricular  ejection fraction, by estimation, is 45 to 50%. The left ventricle has  mildly decreased function. The  left ventricle demonstrates global  hypokinesis. Left ventricular diastolic function  could not be evaluated.   2. Right ventricular systolic function is normal. The right ventricular  size is mildly enlarged. There is moderately elevated pulmonary artery  systolic pressure. The estimated right ventricular systolic pressure is  43.3 mmHg.   3. Left atrial size was moderately dilated.   4. Right atrial size was mild to moderately dilated.   5. The mitral valve is grossly normal. Trivial mitral valve  regurgitation. No evidence of mitral stenosis.   6. The aortic valve is tricuspid. Aortic valve regurgitation is not  visualized. Aortic valve sclerosis is present, with no evidence of aortic  valve stenosis.   7. The inferior vena cava is dilated in size with >50% respiratory  variability, suggesting right atrial pressure of 8 mmHg.   Comparison(s): No significant change from prior study.   Patient Profile     81 y.o. 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 Cardiology consulted for CHF.   Assessment & Plan    1. Acute HFmrEF - He has a known cardiomyopathy dating back to 2018 with EF 40-45% at that time and EF was previously at 50 to 55% in 07/2021. Repeat echocardiogram this admission overall similar with EF at 45 to 50% with global hypokinesis. - Initially treated for PNA but antibiotics were stopped given negative procalcitonin. Repeat CXR yesterday showed persistent bilateral pulmonary infiltrates in the upper lobe consistent with PNA and will defer possible antibiotic treatment to the admitting team.  - ReDS vest reading was elevated to 45 yesterday and Lasix was titrated from '40mg'$  BID to '60mg'$  BID. He did have  improved urinary output with this and -2.0 L yesterday and overall -4.2 this admission. Weights have been variable so doubt accuracy of this.  - Given his cardiomyopathy, Cardizem CD has been discontinued and transitioned to Toprol-XL '25mg'$  daily and Lisinopril transitioned to Losartan. Also started on Jardiance '10mg'$  daily. Will order a repeat BMET for today. Would not adjust his cardiac medications further at this time until it has resulted. He is anxious to go home today  but suspect he would benefit from an additional day of IV diuresis. If discharged, would have him take Lasix '40mg'$  BID for several days then resume '40mg'$  daily.   2. CAD - He is s/p CABG in 2015. He was having dyspnea prior to admission but denies any recent exertional chest pain. Troponin values have been flat at 24 and 25 which is not consistent with ACS.  - Continue Toprol-XL '25mg'$  daily and Atorvastatin '80mg'$  daily. Not on ASA due to being on Xarelto.    3. Persistent Atrial Fibrillation - He is s/p ablation in 2019 with repeat in 11/2020. He did have recurrent atrial fibrillation afterwards and underwent DCCV in 06/2021. In NSR by review of telemetry. Remains on Amiodarone '200mg'$  daily and Cardizem CD was discontinued given his cardiomyopathy and switched to Toprol-XL '25mg'$  daily.  - No reports of active bleeding. On Xarelto '15mg'$  daily for anticoagulation and dosing was reduced by pharmacy this admission given his renal function.   4. HTN - BP has been variable from 124/49 - 143/54 within the past 24 hours. Was previously on Lisinopril and this was switched to Losartan and Cardizem has been transitioned to Toprol-XL as discussed above.    5. Stage 3 CKD - His baseline creatinine appears to be between 1.7 - 1.8. Was elevated to 1.99 on admission. Will recheck a BMET today following dose adjustment of Lasix yesterday.   For questions or updates, please contact [place] Please consult [email or web address] for contact info under         Signed, Erma Heritage, PA-C  12/13/2021, 9:46 AM

## 2021-12-13 NOTE — Assessment & Plan Note (Addendum)
Remains clinically fluid overloaded Continue IV lasix NEG 6 lbs Daily weights Accurate I/Os--NEG 4.2 L 7/11 Echo EF 45-50%, global HK, RVSP 53.7

## 2021-12-13 NOTE — Consult Note (Signed)
[facility]   Patient name: Caleb Wolf Admit date: 12/10/2021  DOB: December 06, 1940 LOS: 3  MRN: [id number] Consult date: 12/14/2031  Referring provider: Dr. Carles Collet, [facility] CC: Hemoptysis    History:  81 yo male former smoker presented to [facility] ER on 7/11 with dyspnea and chest pain.  Started about 2 weeks prior to admission and associated with leg swelling and orthopnea.  SpO2 89% on room air.  He was started on therapy for CHF exacerbation.  He was found to have new systolic CHF, and cardiology consulted.  He is on xarelto for atrial fibrillation.  He was noted to have persistent O2 needs and developed hemoptysis.  PCCM consulted to assess further.  Past medical history:  CAD s/p CABG, HTN, GERD, HLD, A fib, OSA, AAA, Arthritis, Carotid stenosis, BPH, Gout, Colon polyps, Nephrolithiasis, HLD  Significant events:  7/11 Admit 7/12 procalcitonin negative >> antibiotics stopped 7/13 cardiology consulted  Studies:  CT angio chest 12/10/21 >> mod b/l effusions, compressive ATX RLL, extensive GGO b/l more in upper lobes Echo 12/11/21 >> EF 45 to 50%, RVSP 53.7 mmHg, mod LA/RA dilation  Micro:  COVID 7/11 >> negative Legionella Ag 7/11 >> negative Pneumococcal Ag 7/11 >> negative  Lines:     Antibiotics:  Rocephin 7/11 Zithromax 7/11   Consults:  Cardiology    Interim history:  He was fine until he went to [place] in June.  Towards the end of his trip he started feeling week and short of breath.  He had a dry cough.  No fever, sinus congestion, or sore throat.  He was limiting his use of lasix in [place] so he wouldn't have to use the bathroom as much.  He is not aware of any environmental or sick exposure while in [place], and wasn't started on any new medications recently.  He denies bird exposure or skin rash.  He returned to [place] and was still feeling bad.  His PCP had him get a chest xray, and then advised him to come to  hospital for admission.  He has not had any trouble swallowing.  No history of CTD.  His cough is mostly dry.  He occasional brings up sputum and has some blood mixed in.  This has happened 3 times with the most recent happening earlier this morning.  Vital signs:  BP (!) 143/54 (BP Location: Left Arm)   Pulse 73   Temp 99.5 F (37.5 C) (Oral)   Resp 20   Ht 5' 9" (1.753 m)   Wt 104.9 kg   SpO2 95%   BMI 34.15 kg/m   Intake/output:  I/O last 3 completed shifts: In: 25 [P.O.:880] Out: 2900 [Urine:2900]   Physical exam:   General - alert Eyes - pupils reactive ENT - no sinus tenderness, no stridor Cardiac - regular rate/rhythm, no murmur Chest - equal breath sounds b/l, no wheezing or rales Abdomen - soft, non tender, + bowel sounds Extremities - compression stockings on Skin - no rashes Neuro - normal strength, moves extremities, follows commands Psych - normal mood and behavior  Best practice:   DVT - xarelto SUP - protonix Nutrition - carb modified   Discussion:  He has subacute onset of cough, dyspnea, and hypoxia associated with multifocal pulmonary infiltrates, pleural effusions, and new onset systolic CHF.  His procalcitonin is negative and his symptoms aren't typical for bacterial pneumonia.  I don't think he needs additional antibiotics at this time.  I am more  concerned about an inflammatory process which could be post-viral BOOP, or other environmental exposure when he travelled to [place].  He has history of atrial fibrillation and is on amiodarone (restarted 05/15/21) and xarelto.  It is less likely, but possible he could have pneumonitis from amiodarone.  He likely has airway irritation from inflammatory process causing mild hemoptysis.  Assessment/plan:   Acute hypoxic respiratory failure with pulmonary infiltrates and bilateral pleural effusions. - adjust oxygen to keep SpO2 . 92% - check respiratory viral panel, ESR, BNP, ANA, RF, ANCA - add solumedrol 60 mg  bid for now - f/u CXR intermittently - change bronchodilators to prn  Hemoptysis. - will check with cardiology and hospitalist if he can have a holiday from [place] for couple of days until this improves - don't think he needs bronchoscopy at this time  Obstructive sleep apnea. - CPAP qhs - followed by Dr. Keturah Barre as an outpt  Acute systolic CHF. Acute on chronic diastolic CHF. Hx of CAD s/p CABG, Atrial fibrillation. - per cardiology and primary team  Anemia of chronic disease. CKD 3b. - per primary team  Resolved hospital problems:    Goals of care/Family discussions:  Code status: full  Labs:      Latest Ref Rng & Units 12/13/2021    8:48 AM 12/12/2021    5:09 AM 12/11/2021    5:36 AM  CMP  Glucose 70 - 99 mg/dL 125  114  112   BUN 8 - 23 mg/dL 35  35  38   Creatinine 0.61 - 1.24 mg/dL 1.92  1.92  1.93   Sodium 135 - 145 mmol/L 139  139  141   Potassium 3.5 - 5.1 mmol/L 4.0  4.0  4.2   Chloride 98 - 111 mmol/L 99  103  103   CO2 22 - 32 mmol/L _0 Calcium 8.9 - 10.3 mg/dL 8.4  8.1  8.5   Total Protein 6.5 - 8.1 g/dL   5.9   Total Bilirubin 0.3 - 1.2 mg/dL   1.0   Alkaline Phos 38 - 126 U/L   96   AST 15 - 41 U/L   16   ALT 0 - 44 U/L   24        Latest Ref Rng & Units 12/13/2021    8:48 AM 12/12/2021    5:09 AM 12/11/2021    5:36 AM  CBC  WBC 4.0 - 10.5 K/uL 9.4  8.1  8.0   Hemoglobin 13.0 - 17.0 g/dL 8.8  8.3  8.7   Hematocrit 39.0 - 52.0 % 28.6  26.3  28.1   Platelets 150 - 400 K/uL 279  261  242     ABG    Component Value Date/Time   PHART 7.350 05/23/2014 0339   PCO2ART 41.3 05/23/2014 0339   PO2ART 82.0 05/23/2014 0339   HCO3 22.7 05/23/2014 0339   TCO2 26 07/06/2020 0911   ACIDBASEDEF 3.0 (H) 05/23/2014 0339   O2SAT 95.0 05/23/2014 0339    CBG (last 3)  No results for input(s): "GLUCAP" in the last 72 hours.   Past surgical history:  He  has a past surgical history that includes Fracture surgery; Rhinoplasty; Joint  replacement (2011); Total hip arthroplasty (12/08/2011); Tonsillectomy; Wrist surgery (Right, 80yr ago); Lithotripsy; Back surgery; Wrist surgery (Left); Colonoscopy; Pars plana vitrectomy w/ repair of macular hole; Endarterectomy (Left, 02/17/2014); left heart catheterization with coronary angiogram (N/A, 05/22/2014); Cardiac catheterization (05/22/2014); Coronary artery  bypass graft (N/A, 05/22/2014); Esophagogastroduodenoscopy (N/A, 08/22/2015); Esophageal dilation (N/A, 08/22/2015); Eye surgery; Upper gi endoscopy (12/18/2015); Cataract surgery (Right); Cardioversion (N/A, 04/29/2017); TEE without cardioversion (N/A, 04/29/2017); Ablation of dysrhythmic focus (07/28/2017); ATRIAL FIBRILLATION ABLATION (N/A, 07/28/2017); Cardioversion (N/A, 08/13/2017); Esophagogastroduodenoscopy (N/A, 12/30/2017); Colonoscopy (N/A, 12/30/2017); Cardioversion (N/A, 08/15/2019); Cardioversion (N/A, 03/09/2020); Cardioversion (N/A, 07/06/2020); ATRIAL FIBRILLATION ABLATION (N/A, 12/04/2020); Cardioversion (N/A, 01/18/2021); and Cardioversion (N/A, 06/17/2021).  Social history:  He  reports that he quit smoking about 39 years ago. His smoking use included cigarettes. He started smoking about 61 years ago. He has a 37.50 pack-year smoking history. He has never used smokeless tobacco. He reports that he does not drink alcohol and does not use drugs.   Review of systems:  Reviewed and negative  Family history:  His family history includes Allergies in his mother; Heart disease in his father and mother; Hypertension in his mother.    Medications:   No current facility-administered medications on file prior to encounter.   Current Outpatient Medications on File Prior to Encounter  Medication Sig   acetaminophen (TYLENOL) 500 MG tablet Take 1,000 mg by mouth every 6 (six) hours as needed for moderate pain.   amiodarone (PACERONE) 200 MG tablet Take 1 tablet (200 mg total) by mouth daily.   amoxicillin (AMOXIL) 500 MG capsule Take 2,000  mg by mouth See admin instructions. Take 4 capsules (2000 mg) by mouth before dental procedures   atorvastatin (LIPITOR) 80 MG tablet TAKE 1 TABLET BY MOUTH EVERY EVENING   colchicine 0.6 MG tablet Take 0.6 mg by mouth daily as needed (for gout flare up).   diltiazem (CARDIZEM CD) 120 MG 24 hr capsule TAKE 1 CAPSULE BY MOUTH EVERY DAY   doxazosin (CARDURA) 4 MG tablet Take 4 mg by mouth in the morning.   finasteride (PROSCAR) 5 MG tablet Take 5 mg by mouth every evening.    fluticasone (FLONASE) 50 MCG/ACT nasal spray Place 1 spray into both nostrils daily as needed for allergies or rhinitis.   furosemide (LASIX) 40 MG tablet TAKE 1 TABLET BY MOUTH TWICE A DAY (Patient taking differently: Take 40 mg by mouth daily. If needed can take an additional tablet)   lisinopril (PRINIVIL,ZESTRIL) 40 MG tablet Take 40 mg by mouth in the morning.   nitroGLYCERIN (NITROSTAT) 0.4 MG SL tablet Place 1 tablet (0.4 mg total) under the tongue every 5 (five) minutes x 3 doses as needed for chest pain.   pantoprazole (PROTONIX) 40 MG tablet Take 1 tablet (40 mg total) by mouth daily before breakfast. (Patient taking differently: Take 40 mg by mouth every evening.)   furosemide (LASIX) 40 MG tablet Take 1 tablet by mouth 2 (two) times daily.   rivaroxaban (XARELTO) 20 MG TABS tablet Take 1 tablet (20 mg total) by mouth daily with supper. (Patient taking differently: Take 20 mg by mouth daily.)     Signature:  Chesley Mires, MD [place] Pager - [phone] 12/13/2021, 1:34 PM

## 2021-12-13 NOTE — Assessment & Plan Note (Addendum)
Baseline creatinine 1.7-1.9 Monitor with diuresis Serum creatinine

## 2021-12-13 NOTE — Assessment & Plan Note (Addendum)
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

## 2021-12-13 NOTE — Assessment & Plan Note (Signed)
s/p ablation in 2019 with repeat in 11/2020. He did haverecurrent atrial fibrillation afterwards and underwent DCCV in 06/2021 Now in sinus Remains on Amiodarone '200mg'$  daily and Cardizem CD was discontinued given his cardiomyopathy and switched to Toprol-XL '25mg'$  daily.  Holding xarelto due to hemoptysis>>resume

## 2021-12-13 NOTE — Progress Notes (Signed)
Two nurses attempted REDs Clip on this patient, low quality all attempts. Charted on flow sheet.

## 2021-12-13 NOTE — Progress Notes (Addendum)
PROGRESS NOTE  Caleb Wolf:[id number] DOB: 04/16/1941 DOA: 12/10/2021 PCP: Asencion Noble, MD  Brief History:  81 y.o. male with medical history significant of coronary artery disease, essential hypertension, GERD, hyperlipidemia, atrial fibrillation, and more presents ED with a chief complaint of dyspnea. Patient reported of bilateral leg swelling and dyspnea on exertion ongoing for the past 1-2 weeks.  CTA chest negative, shows moderate bilateral pleural effusions with concerns for multifocal pneumonia.  COVID test is negative, started on IV Rocephin and azithromycin.  But later his symptoms were thought to be due to CHF exacerbation therefore antibiotics discontinued. When PCT <0.10.  He had small volume hemoptysis for which Xarelto was stopped temporarily.  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) ACUTE HFmrEF Remains clinically fluid overloaded Continue IV lasix NEG 6 lbs Daily weights Accurate I/Os--NEG 4.2 L 7/11 Echo EF 45-50%, global HK, RVSP 53.7  Hemoptysis Appreciate pulmonary consult 7/11 CTA chest--neg PE, bilateral GGO -monitor on solumedrol -follow up ANA, ANCA, RF, ESR, and viral resp panel  Chronic kidney disease, stage 3b (HCC) Baseline creatinine 1.7-1.9 Monitor with diuresis  Acute respiratory failure with hypoxia (HCC) - Oxygen sats as low as 85% on room air -Requiring 2 L nasal cannula to maintain mid 90s -Secondary to CHF and BOOP -Wean off O2 as tolerated  CAD (coronary artery disease) -no Chest pain. -EKG has baseline wander but no acute ischemic changes -s/p CABG in 2015. He was having dyspnea prior to admission but denies any recent exertional chest pain.  - Continue Toprol-XL 53m daily and Atorvastatin 828mdaily. Not on ASA due to being on Xarelto.   Persistent atrial fibrillation (H[place]/p ablation in 2019 with repeat in 11/2020. He did have recurrent atrial  fibrillation afterwards and underwent DCCV in 06/2021 Now in sinus Remains on Amiodarone 20055maily and Cardizem CD was discontinued given his cardiomyopathy and switched to Toprol-XL 66m9mily.  Holding xarelto due to hemoptysis  GERD (gastroesophageal reflux disease) - Continue Protonix  HTN (hypertension) - Continue lisinopril changed to losartan - cardizem transitioned to metoprolol succinate  OSA (obstructive sleep apnea) - Continue CPAP        Family Communication:   spouse at bedside updated  Consultants:  cardiology, pulm  Code Status:  FULL  DVT Prophylaxis:  Xarelto--on hold   Procedures: As Listed in Progress Note Above  Antibiotics: None      Subjective: Patient breathing better, but has some dyspnea on exertion.  Denies f/c, cp, n/v/d,abd pain  Objective: Vitals:   12/13/21 0500 12/13/21 0507 12/13/21 0852 12/13/21 1325  BP:  (!) 143/54  (!) 120/56  Pulse:  73  61  Resp:  20  20  Temp:  99.5 F (37.5 C)  99.1 F (37.3 C)  TempSrc:  Oral  Oral  SpO2:  94% 95% 98%  Weight: 104.9 kg     Height:        Intake/Output Summary (Last 24 hours) at 12/13/2021 1809 Last data filed at 12/13/2021 0500 Gross per 24 hour  Intake --  Output 1700 ml  Net -1700 ml   Weight change: 0.7 kg Exam:  General:  Pt is alert, follows commands appropriately, not in acute distress HEENT: No icterus, No thrush, No neck mass, [place]/AT Cardiovascular: RRR, S1/S2, no rubs, no gallops Respiratory: bibasilar crackles. No wheeze Abdomen: Soft/+BS, non tender, non distended, no guarding Extremities: 1 +  LE edema, No lymphangitis, No petechiae, No rashes, no synovitis   Data Reviewed: I have personally reviewed following labs and imaging studies Basic Metabolic Panel: Recent Labs  Lab 12/10/21 1700 12/11/21 0536 12/12/21 0509 12/13/21 0848  NA 139 141 139 139  K 4.2 4.2 4.0 4.0  CL 106 103 103 99  CO2 '25 28 28 30  ' GLUCOSE 111* 112* 114* 125*  BUN 39* 38*  35* 35*  CREATININE 1.99* 1.93* 1.92* 1.92*  CALCIUM 8.3* 8.5* 8.1* 8.4*  MG  --   --  2.1  --    Liver Function Tests: Recent Labs  Lab 12/10/21 1700 12/11/21 0536  AST 18 16  ALT 28 24  ALKPHOS 95 96  BILITOT 1.2 1.0  PROT 5.9* 5.9*  ALBUMIN 2.8* 2.8*   No results for input(s): "LIPASE", "AMYLASE" in the last 168 hours. No results for input(s): "AMMONIA" in the last 168 hours. Coagulation Profile: No results for input(s): "INR", "PROTIME" in the last 168 hours. CBC: Recent Labs  Lab 12/10/21 1700 12/11/21 0536 12/12/21 0509 12/13/21 0848  WBC 8.3 8.0 8.1 9.4  NEUTROABS 6.7  --   --   --   HGB 8.6* 8.7* 8.3* 8.8*  HCT 27.4* 28.1* 26.3* 28.6*  MCV 95.1 96.6 95.3 95.7  PLT 248 242 261 279   Cardiac Enzymes: No results for input(s): "CKTOTAL", "CKMB", "CKMBINDEX", "TROPONINI" in the last 168 hours. BNP: Invalid input(s): "POCBNP" CBG: No results for input(s): "GLUCAP" in the last 168 hours. HbA1C: No results for input(s): "HGBA1C" in the last 72 hours. Urine analysis:    Component Value Date/Time   COLORURINE YELLOW 02/16/2014 [place] 02/16/2014 1333   LABSPEC 1.012 02/16/2014 1333   PHURINE 5.5 02/16/2014 1333   GLUCOSEU NEGATIVE 02/16/2014 1333   HGBUR NEGATIVE 02/16/2014 1333   BILIRUBINUR NEGATIVE 02/16/2014 1333   KETONESUR NEGATIVE 02/16/2014 1333   PROTEINUR NEGATIVE 02/16/2014 1333   UROBILINOGEN 0.2 02/16/2014 1333   NITRITE NEGATIVE 02/16/2014 1333   LEUKOCYTESUR NEGATIVE 02/16/2014 1333   Sepsis Labs: '@LABRCNTIP' (procalcitonin:4,lacticidven:4) ) Recent Results (from the past 240 hour(s))  Respiratory (~20 pathogens) panel by PCR     Status: None   Collection Time: 12/13/21 12:56 PM   Specimen: Nasopharyngeal Swab; Respiratory  Result Value Ref Range Status   Adenovirus NOT DETECTED NOT DETECTED Final   Coronavirus 229E NOT DETECTED NOT DETECTED Final    Comment: (NOTE) The Coronavirus on the Respiratory Panel, DOES NOT  test for the novel  Coronavirus (2019 nCoV)    Coronavirus HKU1 NOT DETECTED NOT DETECTED Final   Coronavirus NL63 NOT DETECTED NOT DETECTED Final   Coronavirus OC43 NOT DETECTED NOT DETECTED Final   Metapneumovirus NOT DETECTED NOT DETECTED Final   Rhinovirus / Enterovirus NOT DETECTED NOT DETECTED Final   Influenza A NOT DETECTED NOT DETECTED Final   Influenza B NOT DETECTED NOT DETECTED Final   Parainfluenza Virus 1 NOT DETECTED NOT DETECTED Final   Parainfluenza Virus 2 NOT DETECTED NOT DETECTED Final   Parainfluenza Virus 3 NOT DETECTED NOT DETECTED Final   Parainfluenza Virus 4 NOT DETECTED NOT DETECTED Final   Respiratory Syncytial Virus NOT DETECTED NOT DETECTED Final   Bordetella pertussis NOT DETECTED NOT DETECTED Final   Bordetella Parapertussis NOT DETECTED NOT DETECTED Final   Chlamydophila pneumoniae NOT DETECTED NOT DETECTED Final   Mycoplasma pneumoniae NOT DETECTED NOT DETECTED Final    Comment: Performed at [facility], [place] [address] [place], [place]  [address]     Scheduled Meds:  amiodarone  200 mg Oral Daily   atorvastatin  80 mg Oral QPM   doxazosin  4 mg Oral q AM   empagliflozin  10 mg Oral Daily   finasteride  5 mg Oral QPM   furosemide  60 mg Intravenous Q12H   losartan  50 mg Oral Daily   methylPREDNISolone (SOLU-MEDROL) injection  60 mg Intravenous Q12H   metoprolol succinate  25 mg Oral Daily   pantoprazole  40 mg Oral QPM   Continuous Infusions:  Procedures/Studies: DG Chest 2 View  Result Date: 12/12/2021 CLINICAL DATA:  BILATERAL leg swelling and dyspnea on exertion for 1-2 weeks EXAM: CHEST - 2 VIEW COMPARISON:  12/09/2021 FINDINGS: Upper normal size of cardiac silhouette post CABG. Mediastinal contour stable. Diffuse BILATERAL pulmonary infiltrates greater in the upper lobes favoring pneumonia. Small bibasilar effusions. No pneumothorax. IMPRESSION: Persistent BILATERAL pulmonary infiltrates with upper lobe predominance consistent with  pneumonia. Electronically Signed   By: Lavonia Dana [name]   On: 12/12/2021 10:56   ECHOCARDIOGRAM COMPLETE  Result Date: 12/11/2021    ECHOCARDIOGRAM REPORT   Patient Name:   Caleb Wolf Date of Exam: 12/11/2021 Medical Rec #:  [id number]    Height:       69.0 in Accession #:    [id number]   Weight:       228.2 lb Date of Birth:  1940-06-17     BSA:          2.185 m Patient Age:    72 years     BP:           125/58 mmHg Patient Gender: M            HR:           70 bpm. Exam Location:  [facility] Procedure: 2D Echo, Cardiac Doppler and Color Doppler Indications:    CHF  History:        Patient has prior history of Echocardiogram examinations, most                 recent 07/23/2021. CHF, Previous Myocardial Infarction and CAD,                 Abnormal ECG and Prior CABG, Arrythmias:Atrial Fibrillation,                 Signs/Symptoms:Chest Pain and Edema; Risk Factors:Hypertension,                 Dyslipidemia and Former Smoker.  Sonographer:    Wenda Low Referring Phys: [id number] ASIA B [place]  1. Septal movement consistent with postop state. Left ventricular ejection fraction, by estimation, is 45 to 50%. The left ventricle has mildly decreased function. The left ventricle demonstrates global hypokinesis. Left ventricular diastolic function could not be evaluated.  2. Right ventricular systolic function is normal. The right ventricular size is mildly enlarged. There is moderately elevated pulmonary artery systolic pressure. The estimated right ventricular systolic pressure is 69.4 mmHg.  3. Left atrial size was moderately dilated.  4. Right atrial size was mild to moderately dilated.  5. The mitral valve is grossly normal. Trivial mitral valve regurgitation. No evidence of mitral stenosis.  6. The aortic valve is tricuspid. Aortic valve regurgitation is not visualized. Aortic valve sclerosis is present, with no evidence of aortic valve stenosis.  7. The inferior vena cava is dilated in size  with >50% respiratory variability, suggesting right atrial pressure  of 8 mmHg. Comparison(s): No significant change from prior study. FINDINGS  Left Ventricle: Septal movement consistent with postop state. Left ventricular ejection fraction, by estimation, is 45 to 50%. The left ventricle has mildly decreased function. The left ventricle demonstrates global hypokinesis. The left ventricular internal cavity size was normal in size. There is no left ventricular hypertrophy. Abnormal (paradoxical) septal motion consistent with post-operative status. Left ventricular diastolic function could not be evaluated due to atrial fibrillation. Left ventricular diastolic function could not be evaluated. Right Ventricle: The right ventricular size is mildly enlarged. No increase in right ventricular wall thickness. Right ventricular systolic function is normal. There is moderately elevated pulmonary artery systolic pressure. The tricuspid regurgitant velocity is 3.38 m/s, and with an assumed right atrial pressure of 8 mmHg, the estimated right ventricular systolic pressure is 08.1 mmHg. Left Atrium: Left atrial size was moderately dilated. Right Atrium: Right atrial size was mild to moderately dilated. Pericardium: There is no evidence of pericardial effusion. Mitral Valve: The mitral valve is grossly normal. Trivial mitral valve regurgitation. No evidence of mitral valve stenosis. MV peak gradient, 4.6 mmHg. The mean mitral valve gradient is 1.0 mmHg. Tricuspid Valve: The tricuspid valve is grossly normal. Tricuspid valve regurgitation is mild . No evidence of tricuspid stenosis. Aortic Valve: The aortic valve is tricuspid. Aortic valve regurgitation is not visualized. Aortic valve sclerosis is present, with no evidence of aortic valve stenosis. Aortic valve mean gradient measures 3.0 mmHg. Aortic valve peak gradient measures 5.9  mmHg. Aortic valve area, by VTI measures 2.85 cm. Pulmonic Valve: The pulmonic valve was grossly  normal. Pulmonic valve regurgitation is mild. No evidence of pulmonic stenosis. Aorta: The aortic root and ascending aorta are structurally normal, with no evidence of dilitation. Venous: The inferior vena cava is dilated in size with greater than 50% respiratory variability, suggesting right atrial pressure of 8 mmHg. IAS/Shunts: The atrial septum is grossly normal.  LEFT VENTRICLE PLAX 2D LVIDd:         5.60 cm      Diastology LVIDs:         3.60 cm      LV e' medial:    7.72 cm/s LV PW:         1.10 cm      LV E/e' medial:  11.2 LV IVS:        0.90 cm      LV e' lateral:   9.90 cm/s LVOT diam:     2.10 cm      LV E/e' lateral: 8.7 LV SV:         79 LV SV Index:   36 LVOT Area:     3.46 cm  LV Volumes (MOD) LV vol d, MOD A2C: 137.0 ml LV vol d, MOD A4C: 120.0 ml LV vol s, MOD A2C: 68.5 ml LV vol s, MOD A4C: 68.2 ml LV SV MOD A2C:     68.5 ml LV SV MOD A4C:     120.0 ml LV SV MOD BP:      60.9 ml RIGHT VENTRICLE RV Basal diam:  3.90 cm RV Mid diam:    2.80 cm RV S prime:     12.60 cm/s TAPSE (M-mode): 2.6 cm LEFT ATRIUM              Index        RIGHT ATRIUM           Index LA diam:  4.70 cm  2.15 cm/m   RA Area:     29.20 cm LA Vol (A2C):   114.0 ml 52.17 ml/m  RA Volume:   103.00 ml 47.14 ml/m LA Vol (A4C):   94.7 ml  43.34 ml/m LA Biplane Vol: 108.0 ml 49.43 ml/m  AORTIC VALVE                    PULMONIC VALVE AV Area (Vmax):    2.69 cm     PV Vmax:       0.92 m/s AV Area (Vmean):   2.69 cm     PV Peak grad:  3.4 mmHg AV Area (VTI):     2.85 cm AV Vmax:           121.00 cm/s AV Vmean:          78.700 cm/s AV VTI:            0.276 m AV Peak Grad:      5.9 mmHg AV Mean Grad:      3.0 mmHg LVOT Vmax:         94.00 cm/s LVOT Vmean:        61.100 cm/s LVOT VTI:          0.227 m LVOT/AV VTI ratio: 0.82  AORTA Ao Root diam: 3.20 cm Ao Asc diam:  3.20 cm MITRAL VALVE               TRICUSPID VALVE MV Area (PHT): 3.27 cm    TR Peak grad:   45.7 mmHg MV Area VTI:   2.26 cm    TR Vmax:        338.00 cm/s  MV Peak grad:  4.6 mmHg MV Mean grad:  1.0 mmHg    SHUNTS MV Vmax:       1.07 m/s    Systemic VTI:  0.23 m MV Vmean:      52.6 cm/s   Systemic Diam: 2.10 cm MV Decel Time: 232 msec MV E velocity: 86.50 cm/s MV A velocity: 33.00 cm/s MV E/A ratio:  2.62 Eleonore Chiquito MD Electronically signed by Eleonore Chiquito MD Signature Date/Time: 12/11/2021/4:39:55 PM    Final    CT Angio Chest PE W and/or Wo Contrast  Result Date: 12/10/2021 CLINICAL DATA:  Pulmonary embolism (PE) suspected, high prob. Shortness of breath. Cough. Chest pain. EXAM: CT ANGIOGRAPHY CHEST WITH CONTRAST TECHNIQUE: Multidetector CT imaging of the chest was performed using the standard protocol during bolus administration of intravenous contrast. Multiplanar CT image reconstructions and MIPs were obtained to evaluate the vascular anatomy. RADIATION DOSE REDUCTION: This exam was performed according to the departmental dose-optimization program which includes automated exposure control, adjustment of the mA and/or kV according to patient size and/or use of iterative reconstruction technique. CONTRAST:  20m OMNIPAQUE IOHEXOL 350 MG/ML SOLN COMPARISON:  11/27/2020 FINDINGS: Cardiovascular: Heart is mildly enlarged. No filling defects in the pulmonary arteries to suggest pulmonary emboli. Prior CABG. Aortic atherosclerosis. No aneurysm. Mediastinum/Nodes: No mediastinal, hilar, or axillary adenopathy. Trachea and esophagus are unremarkable. Thyroid unremarkable. Lungs/Pleura: Moderate bilateral pleural effusions. Compressive atelectasis in the right lower lobe. Extensive ground-glass airspace disease in both lungs, most pronounced in the upper lobes concerning for pneumonia. Upper Abdomen: No acute findings Musculoskeletal: Chest wall soft tissues are unremarkable. No acute bony abnormality. Review of the MIP images confirms the above findings. IMPRESSION: No evidence of pulmonary embolus. Moderate bilateral pleural effusions. Bilateral ground-glass  airspace opacities most pronounced in the  upper lobes, left greater than right. Findings concerning for multifocal pneumonia. Aortic Atherosclerosis (ICD10-I70.0). Electronically Signed   By: Rolm Baptise [name]   On: 12/10/2021 19:28   DG Chest 2 View  Result Date: 12/10/2021 CLINICAL DATA:  Shortness of breath EXAM: CHEST - 2 VIEW COMPARISON:  November 20, 2017 FINDINGS: The heart size and mediastinal contours are stable. Patchy consolidation is identified throughout bilateral lungs. Small bilateral pleural effusions are noted. The visualized skeletal structures are stable. IMPRESSION: Patchy consolidation identified throughout bilateral lungs, suspicious for pneumonia. Electronically Signed   By: Abelardo Diesel [name]   On: 12/10/2021 09:52    Orson Eva, DO  [facility]  If 7PM-7AM, please contact night-coverage [email or web address] Password [email or web address] 12/13/2021, 6:09 PM   LOS: 3 days

## 2021-12-13 NOTE — Assessment & Plan Note (Signed)
ACUTE HFmrEF Remains clinically fluid overloaded Continue IV lasix NEG 6 lbs Daily weights Accurate I/Os--NEG 4.2 L 7/11 Echo EF 45-50%, global HK, RVSP 53.7

## 2021-12-14 DIAGNOSIS — I5022 Chronic systolic (congestive) heart failure: Secondary | ICD-10-CM | POA: Diagnosis not present

## 2021-12-14 DIAGNOSIS — N1832 Chronic kidney disease, stage 3b: Secondary | ICD-10-CM | POA: Diagnosis not present

## 2021-12-14 DIAGNOSIS — J9601 Acute respiratory failure with hypoxia: Secondary | ICD-10-CM | POA: Diagnosis not present

## 2021-12-14 DIAGNOSIS — I4819 Other persistent atrial fibrillation: Secondary | ICD-10-CM

## 2021-12-14 DIAGNOSIS — R042 Hemoptysis: Secondary | ICD-10-CM | POA: Diagnosis not present

## 2021-12-14 LAB — BASIC METABOLIC PANEL
Anion gap: 10 (ref 5–15)
BUN: 45 mg/dL — ABNORMAL HIGH (ref 8–23)
CO2: 29 mmol/L (ref 22–32)
Calcium: 8.8 mg/dL — ABNORMAL LOW (ref 8.9–10.3)
Chloride: 101 mmol/L (ref 98–111)
Creatinine, Ser: 2.01 mg/dL — ABNORMAL HIGH (ref 0.61–1.24)
GFR, Estimated: 33 mL/min — ABNORMAL LOW (ref >60)
Glucose, Bld: 188 mg/dL — ABNORMAL HIGH (ref 70–99)
Potassium: 4.2 mmol/L (ref 3.5–5.1)
Sodium: 140 mmol/L (ref 135–145)

## 2021-12-14 LAB — ANA W/REFLEX IF POSITIVE: Anti Nuclear Antibody (ANA): NEGATIVE

## 2021-12-14 LAB — RHEUMATOID FACTOR: Rheumatoid fact SerPl-aCnc: 12.6 IU/mL (ref <14.0)

## 2021-12-14 NOTE — Progress Notes (Signed)
PROGRESS NOTE  [name]:[id number] DOB: 1940-11-19 DOA: 12/10/2021 PCP: Asencion Noble, MD  Brief History:  81 y.o. male with medical history significant of coronary artery disease, essential hypertension, GERD, hyperlipidemia, atrial fibrillation, and more presents ED with a chief complaint of dyspnea. Patient reported of bilateral leg swelling and dyspnea on exertion ongoing for the past 1-2 weeks.  CTA chest negative, shows moderate bilateral pleural effusions with concerns for multifocal pneumonia.  COVID test is negative, started on IV Rocephin and azithromycin.  But later his symptoms were thought to be due to CHF exacerbation therefore antibiotics discontinued. When PCT <0.10.  He had small volume hemoptysis for which Xarelto was stopped temporarily.  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) ACUTE HFmrEF Remains clinically fluid overloaded Continue IV lasix NEG 12 lbs Daily weights>>227 (standing on 7/15) Accurate I/Os--NEG 5.6 L 7/11 Echo EF 45-50%, global HK, RVSP 53.7  Hemoptysis Appreciate pulmonary consult>>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 -EKG has baseline wander but no acute ischemic changes -s/p CABG in 2015. He was having dyspnea prior to admission but denies any recent exertional chest pain.  - Continue Toprol-XL '25mg'$  daily and Atorvastatin '80mg'$  daily. Not on ASA due to being on Xarelto.   Persistent atrial fibrillation ([place]) s/p ablation in 2019 with repeat in 11/2020. He did have recurrent atrial fibrillation afterwards and underwent DCCV in 06/2021 Now in sinus Remains on Amiodarone '200mg'$  daily and Cardizem CD was discontinued given his cardiomyopathy and switched to Toprol-XL '25mg'$  daily.  Holding xarelto due to hemoptysis  GERD (gastroesophageal reflux disease) - Continue Protonix  HTN (hypertension) - Continue lisinopril changed to losartan - cardizem transitioned to metoprolol succinate  OSA (obstructive sleep apnea) - Continue CPAP   Family Communication:   spouse at bedside updated 7/15   Consultants:  cardiology, pulm   Code Status:  FULL   DVT Prophylaxis:  Xarelto--on hold     Procedures: As Listed in Progress Note Above   Antibiotics: None       Subjective: Patient states he is slowing improving.  Sob is improving.  Denies f/c,cp, n/v/d, hemoptysis.  Cough is improving  Objective: Vitals:   12/13/21 2101 12/14/21 0633 12/14/21 0721 12/14/21 1427  BP: (!) 121/54 (!) 142/65  (!) 119/52  Pulse: (!) 56 61  60  Resp: '18 20  20  '$ Temp: 98.7 F (37.1 C) 98.3 F (36.8 C)  97.8 F (36.6 C)  TempSrc:  Oral  Oral  SpO2: 98% 97%  94%  Weight:   108.8 kg   Height:  Intake/Output Summary (Last 24 hours) at 12/14/2021 1544 Last data filed at 12/14/2021 1116 Gross per 24 hour  Intake 480 ml  Output 3300 ml  Net -2820 ml   Weight change:  Exam:  General:  Pt is alert, follows commands appropriately, not in acute distress HEENT: No icterus, No thrush, No neck mass,  E. Lopez/AT+JVD Cardiovascular: RRR, S1/S2, no rubs, no gallops Respiratory: fine bibasilar rales. No wheeze Abdomen: Soft/+BS, non tender, non distended, no guarding Extremities: 1 + LE edema, No lymphangitis, No petechiae, No rashes, no synovitis   Data Reviewed: I have personally reviewed following labs and imaging studies Basic Metabolic Panel: Recent Labs  Lab 12/10/21 1700 12/11/21 0536 12/12/21 0509 12/13/21 0848 12/14/21 0945  NA 139 141 139 139 140  K 4.2 4.2 4.0 4.0 4.2  CL 106 103 103 99 101  CO2 '25 28 28 30 29  '$ GLUCOSE 111* 112* 114* 125* 188*  BUN 39* 38* 35* 35* 45*  CREATININE 1.99* 1.93* 1.92* 1.92* 2.01*  CALCIUM 8.3* 8.5* 8.1* 8.4* 8.8*  MG  --   --  2.1  --   --    Liver Function Tests: Recent Labs  Lab 12/10/21 1700 12/11/21 0536  AST 18 16  ALT 28 24  ALKPHOS 95 96  BILITOT 1.2 1.0  PROT 5.9* 5.9*  ALBUMIN 2.8* 2.8*   No results for input(s): "LIPASE", "AMYLASE" in the last 168 hours. No results for input(s): "AMMONIA" in the last 168 hours. Coagulation Profile: No results for input(s): "INR", "PROTIME" in the last 168 hours. CBC: Recent Labs  Lab 12/10/21 1700 12/11/21 0536 12/12/21 0509 12/13/21 0848  WBC 8.3 8.0 8.1 9.4  NEUTROABS 6.7  --   --   --   HGB 8.6* 8.7* 8.3* 8.8*  HCT 27.4* 28.1* 26.3* 28.6*  MCV 95.1 96.6 95.3 95.7  PLT 248 242 261 279   Cardiac Enzymes: No results for input(s): "CKTOTAL", "CKMB", "CKMBINDEX", "TROPONINI" in the last 168 hours. BNP: Invalid input(s): "POCBNP" CBG: No results for input(s): "GLUCAP" in the last 168 hours. HbA1C: No results for input(s): "HGBA1C" in the last 72 hours. Urine analysis:    Component Value Date/Time   COLORURINE YELLOW 02/16/2014 [place] 02/16/2014 1333   LABSPEC 1.012 02/16/2014 1333   PHURINE 5.5 02/16/2014 1333   GLUCOSEU NEGATIVE 02/16/2014 1333   HGBUR NEGATIVE 02/16/2014 1333   BILIRUBINUR NEGATIVE 02/16/2014 1333   KETONESUR NEGATIVE 02/16/2014  1333   PROTEINUR NEGATIVE 02/16/2014 1333   UROBILINOGEN 0.2 02/16/2014 1333   NITRITE NEGATIVE 02/16/2014 1333   LEUKOCYTESUR NEGATIVE 02/16/2014 1333   Sepsis Labs: '@LABRCNTIP'$ (procalcitonin:4,lacticidven:4) ) Recent Results (from the past 240 hour(s))  Respiratory (~20 pathogens) panel by PCR     Status: None   Collection Time: 12/13/21 12:56 PM   Specimen: Nasopharyngeal Swab; Respiratory  Result Value Ref Range Status   Adenovirus NOT DETECTED NOT DETECTED Final   Coronavirus 229E NOT DETECTED NOT DETECTED Final    Comment: (NOTE) The Coronavirus on the Respiratory Panel, DOES NOT test for the novel  Coronavirus (2019 nCoV)    Coronavirus HKU1 NOT DETECTED NOT DETECTED Final   Coronavirus NL63 NOT DETECTED NOT DETECTED Final   Coronavirus OC43 NOT DETECTED NOT DETECTED Final   Metapneumovirus NOT DETECTED NOT DETECTED Final   Rhinovirus / Enterovirus NOT DETECTED NOT DETECTED Final   Influenza A NOT DETECTED NOT DETECTED Final   Influenza B NOT DETECTED NOT DETECTED Final   Parainfluenza Virus 1 NOT DETECTED  NOT DETECTED Final   Parainfluenza Virus 2 NOT DETECTED NOT DETECTED Final   Parainfluenza Virus 3 NOT DETECTED NOT DETECTED Final   Parainfluenza Virus 4 NOT DETECTED NOT DETECTED Final   Respiratory Syncytial Virus NOT DETECTED NOT DETECTED Final   Bordetella pertussis NOT DETECTED NOT DETECTED Final   Bordetella Parapertussis NOT DETECTED NOT DETECTED Final   Chlamydophila pneumoniae NOT DETECTED NOT DETECTED Final   Mycoplasma pneumoniae NOT DETECTED NOT DETECTED Final    Comment: Performed at [facility], [place] [address] [place], [place] [address]     Scheduled Meds:  amiodarone  200 mg Oral Daily   atorvastatin  80 mg Oral QPM   doxazosin  4 mg Oral q AM   empagliflozin  10 mg Oral Daily   finasteride  5 mg Oral QPM   furosemide  60 mg Intravenous Q12H   losartan  50 mg Oral Daily   methylPREDNISolone (SOLU-MEDROL) injection  60 mg Intravenous Q12H    metoprolol succinate  25 mg Oral Daily   pantoprazole  40 mg Oral QPM   Continuous Infusions:  Procedures/Studies: DG Chest 2 View  Result Date: 12/12/2021 CLINICAL DATA:  BILATERAL leg swelling and dyspnea on exertion for 1-2 weeks EXAM: CHEST - 2 VIEW COMPARISON:  12/09/2021 FINDINGS: Upper normal size of cardiac silhouette post CABG. Mediastinal contour stable. Diffuse BILATERAL pulmonary infiltrates greater in the upper lobes favoring pneumonia. Small bibasilar effusions. No pneumothorax. IMPRESSION: Persistent BILATERAL pulmonary infiltrates with upper lobe predominance consistent with pneumonia. Electronically Signed   By: Lavonia Dana [name]   On: 12/12/2021 10:56   ECHOCARDIOGRAM COMPLETE  Result Date: 12/11/2021    ECHOCARDIOGRAM REPORT   Patient Name:   DAIVD FREDERICKSEN Date of Exam: 12/11/2021 Medical Rec #:  [id number]    Height:       69.0 in Accession #:    [id number]   Weight:       228.2 lb Date of Birth:  05-31-1941     BSA:          2.185 m Patient Age:    74 years     BP:           125/58 mmHg Patient Gender: M            HR:           70 bpm. Exam Location:  [facility] Procedure: 2D Echo, Cardiac Doppler and Color Doppler Indications:    CHF  History:        Patient has prior history of Echocardiogram examinations, most                 recent 07/23/2021. CHF, Previous Myocardial Infarction and CAD,                 Abnormal ECG and Prior CABG, Arrythmias:Atrial Fibrillation,                 Signs/Symptoms:Chest Pain and Edema; Risk Factors:Hypertension,                 Dyslipidemia and Former Smoker.  Sonographer:    Wenda Low Referring Phys: [id number] ASIA B [place]  1. Septal movement consistent with postop state. Left ventricular ejection fraction, by estimation, is 45 to 50%. The left ventricle has mildly decreased function. The left ventricle demonstrates global hypokinesis. Left ventricular diastolic function could not be evaluated.  2. Right ventricular systolic  function is normal. The right ventricular size is mildly enlarged.  There is moderately elevated pulmonary artery systolic pressure. The estimated right ventricular systolic pressure is 40.9 mmHg.  3. Left atrial size was moderately dilated.  4. Right atrial size was mild to moderately dilated.  5. The mitral valve is grossly normal. Trivial mitral valve regurgitation. No evidence of mitral stenosis.  6. The aortic valve is tricuspid. Aortic valve regurgitation is not visualized. Aortic valve sclerosis is present, with no evidence of aortic valve stenosis.  7. The inferior vena cava is dilated in size with >50% respiratory variability, suggesting right atrial pressure of 8 mmHg. Comparison(s): No significant change from prior study. FINDINGS  Left Ventricle: Septal movement consistent with postop state. Left ventricular ejection fraction, by estimation, is 45 to 50%. The left ventricle has mildly decreased function. The left ventricle demonstrates global hypokinesis. The left ventricular internal cavity size was normal in size. There is no left ventricular hypertrophy. Abnormal (paradoxical) septal motion consistent with post-operative status. Left ventricular diastolic function could not be evaluated due to atrial fibrillation. Left ventricular diastolic function could not be evaluated. Right Ventricle: The right ventricular size is mildly enlarged. No increase in right ventricular wall thickness. Right ventricular systolic function is normal. There is moderately elevated pulmonary artery systolic pressure. The tricuspid regurgitant velocity is 3.38 m/s, and with an assumed right atrial pressure of 8 mmHg, the estimated right ventricular systolic pressure is 81.1 mmHg. Left Atrium: Left atrial size was moderately dilated. Right Atrium: Right atrial size was mild to moderately dilated. Pericardium: There is no evidence of pericardial effusion. Mitral Valve: The mitral valve is grossly normal. Trivial mitral valve  regurgitation. No evidence of mitral valve stenosis. MV peak gradient, 4.6 mmHg. The mean mitral valve gradient is 1.0 mmHg. Tricuspid Valve: The tricuspid valve is grossly normal. Tricuspid valve regurgitation is mild . No evidence of tricuspid stenosis. Aortic Valve: The aortic valve is tricuspid. Aortic valve regurgitation is not visualized. Aortic valve sclerosis is present, with no evidence of aortic valve stenosis. Aortic valve mean gradient measures 3.0 mmHg. Aortic valve peak gradient measures 5.9  mmHg. Aortic valve area, by VTI measures 2.85 cm. Pulmonic Valve: The pulmonic valve was grossly normal. Pulmonic valve regurgitation is mild. No evidence of pulmonic stenosis. Aorta: The aortic root and ascending aorta are structurally normal, with no evidence of dilitation. Venous: The inferior vena cava is dilated in size with greater than 50% respiratory variability, suggesting right atrial pressure of 8 mmHg. IAS/Shunts: The atrial septum is grossly normal.  LEFT VENTRICLE PLAX 2D LVIDd:         5.60 cm      Diastology LVIDs:         3.60 cm      LV e' medial:    7.72 cm/s LV PW:         1.10 cm      LV E/e' medial:  11.2 LV IVS:        0.90 cm      LV e' lateral:   9.90 cm/s LVOT diam:     2.10 cm      LV E/e' lateral: 8.7 LV SV:         79 LV SV Index:   36 LVOT Area:     3.46 cm  LV Volumes (MOD) LV vol d, MOD A2C: 137.0 ml LV vol d, MOD A4C: 120.0 ml LV vol s, MOD A2C: 68.5 ml LV vol s, MOD A4C: 68.2 ml LV SV MOD A2C:     68.5 ml  LV SV MOD A4C:     120.0 ml LV SV MOD BP:      60.9 ml RIGHT VENTRICLE RV Basal diam:  3.90 cm RV Mid diam:    2.80 cm RV S prime:     12.60 cm/s TAPSE (M-mode): 2.6 cm LEFT ATRIUM              Index        RIGHT ATRIUM           Index LA diam:        4.70 cm  2.15 cm/m   RA Area:     29.20 cm LA Vol (A2C):   114.0 ml 52.17 ml/m  RA Volume:   103.00 ml 47.14 ml/m LA Vol (A4C):   94.7 ml  43.34 ml/m LA Biplane Vol: 108.0 ml 49.43 ml/m  AORTIC VALVE                     PULMONIC VALVE AV Area (Vmax):    2.69 cm     PV Vmax:       0.92 m/s AV Area (Vmean):   2.69 cm     PV Peak grad:  3.4 mmHg AV Area (VTI):     2.85 cm AV Vmax:           121.00 cm/s AV Vmean:          78.700 cm/s AV VTI:            0.276 m AV Peak Grad:      5.9 mmHg AV Mean Grad:      3.0 mmHg LVOT Vmax:         94.00 cm/s LVOT Vmean:        61.100 cm/s LVOT VTI:          0.227 m LVOT/AV VTI ratio: 0.82  AORTA Ao Root diam: 3.20 cm Ao Asc diam:  3.20 cm MITRAL VALVE               TRICUSPID VALVE MV Area (PHT): 3.27 cm    TR Peak grad:   45.7 mmHg MV Area VTI:   2.26 cm    TR Vmax:        338.00 cm/s MV Peak grad:  4.6 mmHg MV Mean grad:  1.0 mmHg    SHUNTS MV Vmax:       1.07 m/s    Systemic VTI:  0.23 m MV Vmean:      52.6 cm/s   Systemic Diam: 2.10 cm MV Decel Time: 232 msec MV E velocity: 86.50 cm/s MV A velocity: 33.00 cm/s MV E/A ratio:  2.62 Eleonore Chiquito MD Electronically signed by Eleonore Chiquito MD Signature Date/Time: 12/11/2021/4:39:55 PM    Final    CT Angio Chest PE W and/or Wo Contrast  Result Date: 12/10/2021 CLINICAL DATA:  Pulmonary embolism (PE) suspected, high prob. Shortness of breath. Cough. Chest pain. EXAM: CT ANGIOGRAPHY CHEST WITH CONTRAST TECHNIQUE: Multidetector CT imaging of the chest was performed using the standard protocol during bolus administration of intravenous contrast. Multiplanar CT image reconstructions and MIPs were obtained to evaluate the vascular anatomy. RADIATION DOSE REDUCTION: This exam was performed according to the departmental dose-optimization program which includes automated exposure control, adjustment of the mA and/or kV according to patient size and/or use of iterative reconstruction technique. CONTRAST:  35m OMNIPAQUE IOHEXOL 350 MG/ML SOLN COMPARISON:  11/27/2020 FINDINGS: Cardiovascular: Heart is mildly enlarged. No filling defects in the pulmonary arteries to  suggest pulmonary emboli. Prior CABG. Aortic atherosclerosis. No aneurysm.  Mediastinum/Nodes: No mediastinal, hilar, or axillary adenopathy. Trachea and esophagus are unremarkable. Thyroid unremarkable. Lungs/Pleura: Moderate bilateral pleural effusions. Compressive atelectasis in the right lower lobe. Extensive ground-glass airspace disease in both lungs, most pronounced in the upper lobes concerning for pneumonia. Upper Abdomen: No acute findings Musculoskeletal: Chest wall soft tissues are unremarkable. No acute bony abnormality. Review of the MIP images confirms the above findings. IMPRESSION: No evidence of pulmonary embolus. Moderate bilateral pleural effusions. Bilateral ground-glass airspace opacities most pronounced in the upper lobes, left greater than right. Findings concerning for multifocal pneumonia. Aortic Atherosclerosis (ICD10-I70.0). Electronically Signed   By: Rolm Baptise [name]   On: 12/10/2021 19:28   DG Chest 2 View  Result Date: 12/10/2021 CLINICAL DATA:  Shortness of breath EXAM: CHEST - 2 VIEW COMPARISON:  November 20, 2017 FINDINGS: The heart size and mediastinal contours are stable. Patchy consolidation is identified throughout bilateral lungs. Small bilateral pleural effusions are noted. The visualized skeletal structures are stable. IMPRESSION: Patchy consolidation identified throughout bilateral lungs, suspicious for pneumonia. Electronically Signed   By: Abelardo Diesel [name]   On: 12/10/2021 09:52    Orson Eva, DO  [facility]  If 7PM-7AM, please contact night-coverage [email or web address] Password [facility] 12/14/2021, 3:44 PM   LOS: 4 days

## 2021-12-15 DIAGNOSIS — N1832 Chronic kidney disease, stage 3b: Secondary | ICD-10-CM | POA: Diagnosis not present

## 2021-12-15 DIAGNOSIS — J9601 Acute respiratory failure with hypoxia: Secondary | ICD-10-CM | POA: Diagnosis not present

## 2021-12-15 DIAGNOSIS — I4819 Other persistent atrial fibrillation: Secondary | ICD-10-CM | POA: Diagnosis not present

## 2021-12-15 DIAGNOSIS — I5022 Chronic systolic (congestive) heart failure: Secondary | ICD-10-CM | POA: Diagnosis not present

## 2021-12-15 LAB — BASIC METABOLIC PANEL
Anion gap: 10 (ref 5–15)
BUN: 54 mg/dL — ABNORMAL HIGH (ref 8–23)
CO2: 29 mmol/L (ref 22–32)
Calcium: 8.5 mg/dL — ABNORMAL LOW (ref 8.9–10.3)
Chloride: 100 mmol/L (ref 98–111)
Creatinine, Ser: 2.05 mg/dL — ABNORMAL HIGH (ref 0.61–1.24)
GFR, Estimated: 32 mL/min — ABNORMAL LOW (ref >60)
Glucose, Bld: 134 mg/dL — ABNORMAL HIGH (ref 70–99)
Potassium: 4.8 mmol/L (ref 3.5–5.1)
Sodium: 139 mmol/L (ref 135–145)

## 2021-12-15 LAB — MAGNESIUM: Magnesium: 2.4 mg/dL (ref 1.7–2.4)

## 2021-12-15 MED ORDER — PREDNISONE 20 MG PO TABS
50.0000 mg | ORAL_TABLET | Freq: Every day | ORAL | Status: DC
  Administered 2021-12-16: 50 mg via ORAL
  Filled 2021-12-15: qty 1

## 2021-12-15 MED ORDER — FUROSEMIDE 40 MG PO TABS
40.0000 mg | ORAL_TABLET | Freq: Two times a day (BID) | ORAL | Status: DC
  Administered 2021-12-16: 40 mg via ORAL
  Filled 2021-12-15: qty 1

## 2021-12-15 NOTE — Progress Notes (Signed)
Patient using home CPAP , full mask with 2 liters added,

## 2021-12-15 NOTE — Discharge Summary (Signed)
Physician Discharge Summary   Patient: Caleb Wolf MRN: [id number] DOB: 1940-06-12  Admit date:     12/10/2021  Discharge date: 12/16/2021  Discharge Physician: Shanon Brow Carli Lefevers   PCP: Asencion Noble, MD   Recommendations at discharge:   Please follow up with primary care provider within 1-2 weeks  Please repeat BMP and CBC in one week    Hospital Course: 81 y.o. male with medical history significant of coronary artery disease, essential hypertension, GERD, hyperlipidemia, atrial fibrillation, and more presents ED with a chief complaint of dyspnea. Patient reported of bilateral leg swelling and dyspnea on exertion ongoing for the past 1-2 weeks.  CTA chest negative, shows moderate bilateral pleural effusions with concerns for multifocal pneumonia.  COVID test is negative, started on IV Rocephin and azithromycin.  But later his symptoms were thought to be due to CHF exacerbation therefore antibiotics discontinued. When PCT <0.10.  He had small volume hemoptysis for which Xarelto was stopped temporarily.  Pulmonary was consulted to assist.  He was started on IV solumedrol with clinical improvement.  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 7/11 CTA chest--neg PE, bilateral GGO -monitor on solumedrol>>improving clinically -follow up ANA,  RF and viral resp panel--neg -ANCA pending -resolved -d/c home with prednisone taper over 1 more week Follow with pulmonary as outpatient  Chronic kidney disease, stage 3b (HCC) Baseline creatinine 1.7-1.9 Monitor with diuresis Serum creatinine 2.16 on day of d/c  Acute respiratory failure with hypoxia (HCC) - Oxygen sats as low as 85% on room  air -Requiring 2 L nasal cannula to maintain mid 90s -Secondary to CHF and BOOP -Wean off O2 as tolerated for saturation >92% -7/16--1.5L with saturation 95% -7/17--d/c home with 2L [name] as he desaturation on RA with ambulation  CAD (coronary artery disease) -no Chest pain. -EKG has baseline wander but no acute ischemic changes -s/p CABG in 2015. He was having dyspnea prior to admission but denies any recent exertional chest pain.  - Continue Toprol-XL '25mg'$  daily and Atorvastatin '80mg'$  daily. Not on ASA due to being on Xarelto.  -Xarelto was held temporarily due to hemoptysis>>resume 15 mg daily  Persistent atrial fibrillation ([place]) s/p ablation in 2019 with repeat in 11/2020. He did have recurrent atrial fibrillation afterwards and underwent DCCV in 06/2021 Now in sinus Remains on Amiodarone '200mg'$  daily and Cardizem CD was discontinued given his cardiomyopathy and switched to Toprol-XL '25mg'$  daily.  Holding xarelto due to hemoptysis>>resume 15 mg daily  GERD (gastroesophageal reflux disease) - Continue Protonix  HTN (hypertension) - Continue lisinopril changed to losartan - cardizem transitioned to metoprolol succinate  OSA (obstructive sleep apnea) - Continue CPAP         Consultants: cardiology Procedures performed: none  Disposition: Home Diet recommendation:  Cardiac diet DISCHARGE MEDICATION: Allergies as of 12/16/2021       Reactions   Codeine Sulfate Nausea Only        Medication List     STOP taking these medications    diltiazem 120 MG 24 hr capsule Commonly known as: CARDIZEM CD   lisinopril 40 MG tablet Commonly known as: ZESTRIL       TAKE these medications    acetaminophen 500 MG tablet Commonly  known as: TYLENOL Take 1,000 mg by mouth every 6 (six) hours as needed for moderate pain.   amiodarone 200 MG tablet Commonly known as: PACERONE Take 1 tablet (200 mg total) by mouth daily.   amoxicillin 500 MG capsule Commonly known as:  AMOXIL Take 2,000 mg by mouth See admin instructions. Take 4 capsules (2000 mg) by mouth before dental procedures   atorvastatin 80 MG tablet Commonly known as: LIPITOR TAKE 1 TABLET BY MOUTH EVERY EVENING   colchicine 0.6 MG tablet Take 0.6 mg by mouth daily as needed (for gout flare up).   doxazosin 4 MG tablet Commonly known as: CARDURA Take 4 mg by mouth in the morning.   finasteride 5 MG tablet Commonly known as: PROSCAR Take 5 mg by mouth every evening.   fluticasone 50 MCG/ACT nasal spray Commonly known as: FLONASE Place 1 spray into both nostrils daily as needed for allergies or rhinitis.   furosemide 40 MG tablet Commonly known as: LASIX Take 1 tablet (40 mg total) by mouth daily. Start taking on: December 17, 2021 What changed:  when to take this Another medication with the same name was removed. Continue taking this medication, and follow the directions you see here.   metoprolol succinate 25 MG 24 hr tablet Commonly known as: TOPROL-XL Take 1 tablet (25 mg total) by mouth daily. Start taking on: December 17, 2021   nitroGLYCERIN 0.4 MG SL tablet Commonly known as: NITROSTAT Place 1 tablet (0.4 mg total) under the tongue every 5 (five) minutes x 3 doses as needed for chest pain.   pantoprazole 40 MG tablet Commonly known as: PROTONIX Take 1 tablet (40 mg total) by mouth daily before breakfast. What changed: when to take this   predniSONE 10 MG tablet Commonly known as: DELTASONE Take 6 tablets (60 mg total) by mouth daily with breakfast. And decrease by one tablet daily Start taking on: December 17, 2021   Rivaroxaban 15 MG Tabs tablet Commonly known as: XARELTO Take 1 tablet (15 mg total) by mouth daily with supper. What changed:  medication strength how much to take               Durable Medical Equipment  (From admission, onward)           Start     Ordered   12/16/21 1124  For home use only DME oxygen  Once       Question Answer Comment   Length of Need 6 Months   Mode or (Route) Nasal cannula   Liters per Minute 2   Oxygen delivery system Gas      12/16/21 1124            Follow-up Information     [facility] Follow up.   Specialty: Cardiology Why: Cardiology Hospital Follow-up on 01/01/2022 at 2:20 PM with Dr. Marlou Porch in the [facility]. Contact information: [place] [phone]        [facility] Follow up.   Why: Home Oxygen Contact information: [place] [address] [phone]                Discharge Exam: Filed Weights   12/14/21 0721 12/15/21 0658 12/16/21 0436  Weight: 108.8 kg 102.8 kg 102.1 kg   HEENT:  /AT, No thrush, no icterus CV:  RRR, no rub, no S3, no S4 Lung:  fine bibasilar crackles. No wheeze Abd:  soft/+BS, NT Ext:  trace LE edema,  no lymphangitis, no synovitis, no rash   Condition at discharge: stable  The results of significant diagnostics from this hospitalization (including imaging, microbiology, ancillary and laboratory) are listed below for reference.   Imaging Studies: DG Chest 2 View  Result Date: 12/12/2021 CLINICAL DATA:  BILATERAL leg swelling and dyspnea on exertion for 1-2 weeks EXAM: CHEST - 2 VIEW COMPARISON:  12/09/2021 FINDINGS: Upper normal size of cardiac silhouette post CABG. Mediastinal contour stable. Diffuse BILATERAL pulmonary infiltrates greater in the upper lobes favoring pneumonia. Small bibasilar effusions. No pneumothorax. IMPRESSION: Persistent BILATERAL pulmonary infiltrates with upper lobe predominance consistent with pneumonia. Electronically Signed   By: Lavonia Dana [name]   On: 12/12/2021 10:56   ECHOCARDIOGRAM COMPLETE  Result Date: 12/11/2021    ECHOCARDIOGRAM REPORT   Patient Name:   Caleb Wolf Date of Exam: 12/11/2021 Medical Rec #:  [id number]    Height:       69.0 in Accession #:    [id number]   Weight:       228.2 lb Date of Birth:  14-Aug-1940     BSA:           2.185 m Patient Age:    48 years     BP:           125/58 mmHg Patient Gender: M            HR:           70 bpm. Exam Location:  [facility] Procedure: 2D Echo, Cardiac Doppler and Color Doppler Indications:    CHF  History:        Patient has prior history of Echocardiogram examinations, most                 recent 07/23/2021. CHF, Previous Myocardial Infarction and CAD,                 Abnormal ECG and Prior CABG, Arrythmias:Atrial Fibrillation,                 Signs/Symptoms:Chest Pain and Edema; Risk Factors:Hypertension,                 Dyslipidemia and Former Wolf.  Sonographer:    Wenda Low Referring Phys: [id number] ASIA B [place]  1. Septal movement consistent with postop state. Left ventricular ejection fraction, by estimation, is 45 to 50%. The left ventricle has mildly decreased function. The left ventricle demonstrates global hypokinesis. Left ventricular diastolic function could not be evaluated.  2. Right ventricular systolic function is normal. The right ventricular size is mildly enlarged. There is moderately elevated pulmonary artery systolic pressure. The estimated right ventricular systolic pressure is 73.4 mmHg.  3. Left atrial size was moderately dilated.  4. Right atrial size was mild to moderately dilated.  5. The mitral valve is grossly normal. Trivial mitral valve regurgitation. No evidence of mitral stenosis.  6. The aortic valve is tricuspid. Aortic valve regurgitation is not visualized. Aortic valve sclerosis is present, with no evidence of aortic valve stenosis.  7. The inferior vena cava is dilated in size with >50% respiratory variability, suggesting right atrial pressure of 8 mmHg. Comparison(s): No significant change from prior study. FINDINGS  Left Ventricle: Septal movement consistent with postop state. Left ventricular ejection fraction, by estimation, is 45 to 50%. The left ventricle has mildly decreased function. The left ventricle demonstrates global  hypokinesis. The left ventricular internal cavity size was normal in size. There is no left ventricular hypertrophy. Abnormal (  paradoxical) septal motion consistent with post-operative status. Left ventricular diastolic function could not be evaluated due to atrial fibrillation. Left ventricular diastolic function could not be evaluated. Right Ventricle: The right ventricular size is mildly enlarged. No increase in right ventricular wall thickness. Right ventricular systolic function is normal. There is moderately elevated pulmonary artery systolic pressure. The tricuspid regurgitant velocity is 3.38 m/s, and with an assumed right atrial pressure of 8 mmHg, the estimated right ventricular systolic pressure is 17.5 mmHg. Left Atrium: Left atrial size was moderately dilated. Right Atrium: Right atrial size was mild to moderately dilated. Pericardium: There is no evidence of pericardial effusion. Mitral Valve: The mitral valve is grossly normal. Trivial mitral valve regurgitation. No evidence of mitral valve stenosis. MV peak gradient, 4.6 mmHg. The mean mitral valve gradient is 1.0 mmHg. Tricuspid Valve: The tricuspid valve is grossly normal. Tricuspid valve regurgitation is mild . No evidence of tricuspid stenosis. Aortic Valve: The aortic valve is tricuspid. Aortic valve regurgitation is not visualized. Aortic valve sclerosis is present, with no evidence of aortic valve stenosis. Aortic valve mean gradient measures 3.0 mmHg. Aortic valve peak gradient measures 5.9  mmHg. Aortic valve area, by VTI measures 2.85 cm. Pulmonic Valve: The pulmonic valve was grossly normal. Pulmonic valve regurgitation is mild. No evidence of pulmonic stenosis. Aorta: The aortic root and ascending aorta are structurally normal, with no evidence of dilitation. Venous: The inferior vena cava is dilated in size with greater than 50% respiratory variability, suggesting right atrial pressure of 8 mmHg. IAS/Shunts: The atrial septum is grossly  normal.  LEFT VENTRICLE PLAX 2D LVIDd:         5.60 cm      Diastology LVIDs:         3.60 cm      LV e' medial:    7.72 cm/s LV PW:         1.10 cm      LV E/e' medial:  11.2 LV IVS:        0.90 cm      LV e' lateral:   9.90 cm/s LVOT diam:     2.10 cm      LV E/e' lateral: 8.7 LV SV:         79 LV SV Index:   36 LVOT Area:     3.46 cm  LV Volumes (MOD) LV vol d, MOD A2C: 137.0 ml LV vol d, MOD A4C: 120.0 ml LV vol s, MOD A2C: 68.5 ml LV vol s, MOD A4C: 68.2 ml LV SV MOD A2C:     68.5 ml LV SV MOD A4C:     120.0 ml LV SV MOD BP:      60.9 ml RIGHT VENTRICLE RV Basal diam:  3.90 cm RV Mid diam:    2.80 cm RV S prime:     12.60 cm/s TAPSE (M-mode): 2.6 cm LEFT ATRIUM              Index        RIGHT ATRIUM           Index LA diam:        4.70 cm  2.15 cm/m   RA Area:     29.20 cm LA Vol (A2C):   114.0 ml 52.17 ml/m  RA Volume:   103.00 ml 47.14 ml/m LA Vol (A4C):   94.7 ml  43.34 ml/m LA Biplane Vol: 108.0 ml 49.43 ml/m  AORTIC VALVE  PULMONIC VALVE AV Area (Vmax):    2.69 cm     PV Vmax:       0.92 m/s AV Area (Vmean):   2.69 cm     PV Peak grad:  3.4 mmHg AV Area (VTI):     2.85 cm AV Vmax:           121.00 cm/s AV Vmean:          78.700 cm/s AV VTI:            0.276 m AV Peak Grad:      5.9 mmHg AV Mean Grad:      3.0 mmHg LVOT Vmax:         94.00 cm/s LVOT Vmean:        61.100 cm/s LVOT VTI:          0.227 m LVOT/AV VTI ratio: 0.82  AORTA Ao Root diam: 3.20 cm Ao Asc diam:  3.20 cm MITRAL VALVE               TRICUSPID VALVE MV Area (PHT): 3.27 cm    TR Peak grad:   45.7 mmHg MV Area VTI:   2.26 cm    TR Vmax:        338.00 cm/s MV Peak grad:  4.6 mmHg MV Mean grad:  1.0 mmHg    SHUNTS MV Vmax:       1.07 m/s    Systemic VTI:  0.23 m MV Vmean:      52.6 cm/s   Systemic Diam: 2.10 cm MV Decel Time: 232 msec MV E velocity: 86.50 cm/s MV A velocity: 33.00 cm/s MV E/A ratio:  2.62 Eleonore Chiquito MD Electronically signed by Eleonore Chiquito MD Signature Date/Time: 12/11/2021/4:39:55 PM    Final     CT Angio Chest PE W and/or Wo Contrast  Result Date: 12/10/2021 CLINICAL DATA:  Pulmonary embolism (PE) suspected, high prob. Shortness of breath. Cough. Chest pain. EXAM: CT ANGIOGRAPHY CHEST WITH CONTRAST TECHNIQUE: Multidetector CT imaging of the chest was performed using the standard protocol during bolus administration of intravenous contrast. Multiplanar CT image reconstructions and MIPs were obtained to evaluate the vascular anatomy. RADIATION DOSE REDUCTION: This exam was performed according to the departmental dose-optimization program which includes automated exposure control, adjustment of the mA and/or kV according to patient size and/or use of iterative reconstruction technique. CONTRAST:  37m OMNIPAQUE IOHEXOL 350 MG/ML SOLN COMPARISON:  11/27/2020 FINDINGS: Cardiovascular: Heart is mildly enlarged. No filling defects in the pulmonary arteries to suggest pulmonary emboli. Prior CABG. Aortic atherosclerosis. No aneurysm. Mediastinum/Nodes: No mediastinal, hilar, or axillary adenopathy. Trachea and esophagus are unremarkable. Thyroid unremarkable. Lungs/Pleura: Moderate bilateral pleural effusions. Compressive atelectasis in the right lower lobe. Extensive ground-glass airspace disease in both lungs, most pronounced in the upper lobes concerning for pneumonia. Upper Abdomen: No acute findings Musculoskeletal: Chest wall soft tissues are unremarkable. No acute bony abnormality. Review of the MIP images confirms the above findings. IMPRESSION: No evidence of pulmonary embolus. Moderate bilateral pleural effusions. Bilateral ground-glass airspace opacities most pronounced in the upper lobes, left greater than right. Findings concerning for multifocal pneumonia. Aortic Atherosclerosis (ICD10-I70.0). Electronically Signed   By: KRolm BaptiseM.D.   On: 12/10/2021 19:28   DG Chest 2 View  Result Date: 12/10/2021 CLINICAL DATA:  Shortness of breath EXAM: CHEST - 2 VIEW COMPARISON:  November 20, 2017  FINDINGS: The heart size and mediastinal contours are stable. Patchy consolidation is identified throughout bilateral lungs. Small bilateral  pleural effusions are noted. The visualized skeletal structures are stable. IMPRESSION: Patchy consolidation identified throughout bilateral lungs, suspicious for pneumonia. Electronically Signed   By: Abelardo Diesel [name]   On: 12/10/2021 09:52    Microbiology: Results for orders placed or performed during the hospital encounter of 12/10/21  Respiratory (~20 pathogens) panel by PCR     Status: None   Collection Time: 12/13/21 12:56 PM   Specimen: Nasopharyngeal Swab; Respiratory  Result Value Ref Range Status   Adenovirus NOT DETECTED NOT DETECTED Final   Coronavirus 229E NOT DETECTED NOT DETECTED Final    Comment: (NOTE) The Coronavirus on the Respiratory Panel, DOES NOT test for the novel  Coronavirus (2019 nCoV)    Coronavirus HKU1 NOT DETECTED NOT DETECTED Final   Coronavirus NL63 NOT DETECTED NOT DETECTED Final   Coronavirus OC43 NOT DETECTED NOT DETECTED Final   Metapneumovirus NOT DETECTED NOT DETECTED Final   Rhinovirus / Enterovirus NOT DETECTED NOT DETECTED Final   Influenza A NOT DETECTED NOT DETECTED Final   Influenza B NOT DETECTED NOT DETECTED Final   Parainfluenza Virus 1 NOT DETECTED NOT DETECTED Final   Parainfluenza Virus 2 NOT DETECTED NOT DETECTED Final   Parainfluenza Virus 3 NOT DETECTED NOT DETECTED Final   Parainfluenza Virus 4 NOT DETECTED NOT DETECTED Final   Respiratory Syncytial Virus NOT DETECTED NOT DETECTED Final   Bordetella pertussis NOT DETECTED NOT DETECTED Final   Bordetella Parapertussis NOT DETECTED NOT DETECTED Final   Chlamydophila pneumoniae NOT DETECTED NOT DETECTED Final   Mycoplasma pneumoniae NOT DETECTED NOT DETECTED Final    Comment: Performed at [facility], [address], [place], [place] [address]    Labs: CBC: Recent Labs  Lab 12/10/21 1700 12/11/21 0536 12/12/21 0509 12/13/21 0848   WBC 8.3 8.0 8.1 9.4  NEUTROABS 6.7  --   --   --   HGB 8.6* 8.7* 8.3* 8.8*  HCT 27.4* 28.1* 26.3* 28.6*  MCV 95.1 96.6 95.3 95.7  PLT 248 242 261 979   Basic Metabolic Panel: Recent Labs  Lab 12/12/21 0509 12/13/21 0848 12/14/21 0945 12/15/21 0434 12/16/21 0433  NA 139 139 140 139 139  K 4.0 4.0 4.2 4.8 4.2  CL 103 99 101 100 100  CO2 '28 30 29 29 30  '$ GLUCOSE 114* 125* 188* 134* 134*  BUN 35* 35* 45* 54* 63*  CREATININE 1.92* 1.92* 2.01* 2.05* 2.16*  CALCIUM 8.1* 8.4* 8.8* 8.5* 8.3*  MG 2.1  --   --  2.4  --    Liver Function Tests: Recent Labs  Lab 12/10/21 1700 12/11/21 0536  AST 18 16  ALT 28 24  ALKPHOS 95 96  BILITOT 1.2 1.0  PROT 5.9* 5.9*  ALBUMIN 2.8* 2.8*   CBG: No results for input(s): "GLUCAP" in the last 168 hours.  Discharge time spent: greater than 30 minutes.  Signed: Orson Eva, MD [facility] 12/16/2021

## 2021-12-15 NOTE — Progress Notes (Signed)
PROGRESS NOTE  [name] [name]:[id number] DOB: [birth year] DOA: 12/10/2021 PCP: Asencion Noble, MD  Brief History:  81 y.o. male with medical history significant of coronary artery disease, essential hypertension, GERD, hyperlipidemia, atrial fibrillation, and more presents ED with a chief complaint of dyspnea. Patient reported of bilateral leg swelling and dyspnea on exertion ongoing for the past 1-2 weeks.  CTA chest negative, shows moderate bilateral pleural effusions with concerns for multifocal pneumonia.  COVID test is negative, started on IV Rocephin and azithromycin.  But later his symptoms were thought to be due to CHF exacerbation therefore antibiotics discontinued. When PCT <0.10.  He had small volume hemoptysis for which Xarelto was stopped temporarily.  Pulmonary was consulted to assist.  He was started on IV solumedrol with clinical improvement.    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 -EKG has baseline wander but no acute ischemic changes -s/p CABG in 2015. He was having dyspnea prior to admission but denies any recent  exertional chest pain.  - Continue Toprol-XL '25mg'$  daily and Atorvastatin '80mg'$  daily. Not on ASA due to being on Xarelto.   Persistent atrial fibrillation ([place]) s/p ablation in 2019 with repeat in 11/2020. He did have recurrent atrial fibrillation afterwards and underwent DCCV in 06/2021 Now in sinus Remains on Amiodarone '200mg'$  daily and Cardizem CD was discontinued given his cardiomyopathy and switched to Toprol-XL '25mg'$  daily.  Holding xarelto due to hemoptysis  GERD (gastroesophageal reflux disease) - Continue Protonix  HTN (hypertension) - Continue lisinopril changed to losartan - cardizem transitioned to metoprolol succinate  OSA (obstructive sleep apnea) - Continue CPAP     Family Communication:   spouse updated 7/16  Consultants:  cardiology  Code Status:  FULL   DVT Prophylaxis:  xarelto on hold   Procedures: As Listed in Progress Note Above  Antibiotics: None     Subjective: He is breathing better.  Patient denies fevers, chills, headache, chest pain, dyspnea, nausea, vomiting, diarrhea, abdominal pain, dysuria, hematuria, hematochezia, and melena. Denies orthopnea  Objective: Vitals:   12/15/21 0658 12/15/21 0814 12/15/21 0856 12/15/21 1400  BP:   (!) 126/56 (!) 104/50  Pulse:   61 (!) 51  Resp:    17  Temp:    97.7 F (36.5 C)  TempSrc:    Oral  SpO2:  93%  97%  Weight: 102.8 kg     Height:        Intake/Output Summary (Last 24 hours) at 12/15/2021 1551 Last data filed at 12/15/2021 0658 Gross per 24 hour  Intake 240 ml  Output 1200 ml  Net -960 ml   Weight change:  Exam:  General:  Pt is alert, follows commands appropriately,  not in acute distress HEENT: No icterus, No thrush, No neck mass, [place]/AT Cardiovascular: RRR, S1/S2, no rubs, no gallops Respiratory: bibasilar rales. No wheeze Abdomen: Soft/+BS, non tender, non distended, no guarding Extremities: trace LE edema, No lymphangitis, No petechiae, No rashes, no synovitis   Data  Reviewed: I have personally reviewed following labs and imaging studies Basic Metabolic Panel: Recent Labs  Lab 12/11/21 0536 12/12/21 0509 12/13/21 0848 12/14/21 0945 12/15/21 0434  NA 141 139 139 140 139  K 4.2 4.0 4.0 4.2 4.8  CL 103 103 99 101 100  CO2 '28 28 30 29 29  '$ GLUCOSE 112* 114* 125* 188* 134*  BUN 38* 35* 35* 45* 54*  CREATININE 1.93* 1.92* 1.92* 2.01* 2.05*  CALCIUM 8.5* 8.1* 8.4* 8.8* 8.5*  MG  --  2.1  --   --  2.4   Liver Function Tests: Recent Labs  Lab 12/10/21 1700 12/11/21 0536  AST 18 16  ALT 28 24  ALKPHOS 95 96  BILITOT 1.2 1.0  PROT 5.9* 5.9*  ALBUMIN 2.8* 2.8*   No results for input(s): "LIPASE", "AMYLASE" in the last 168 hours. No results for input(s): "AMMONIA" in the last 168 hours. Coagulation Profile: No results for input(s): "INR", "PROTIME" in the last 168 hours. CBC: Recent Labs  Lab 12/10/21 1700 12/11/21 0536 12/12/21 0509 12/13/21 0848  WBC 8.3 8.0 8.1 9.4  NEUTROABS 6.7  --   --   --   HGB 8.6* 8.7* 8.3* 8.8*  HCT 27.4* 28.1* 26.3* 28.6*  MCV 95.1 96.6 95.3 95.7  PLT 248 242 261 279   Cardiac Enzymes: No results for input(s): "CKTOTAL", "CKMB", "CKMBINDEX", "TROPONINI" in the last 168 hours. BNP: Invalid input(s): "POCBNP" CBG: No results for input(s): "GLUCAP" in the last 168 hours. HbA1C: No results for input(s): "HGBA1C" in the last 72 hours. Urine analysis:    Component Value Date/Time   COLORURINE YELLOW 02/16/2014 [place] 02/16/2014 1333   LABSPEC 1.012 02/16/2014 1333   PHURINE 5.5 02/16/2014 1333   GLUCOSEU NEGATIVE 02/16/2014 1333   HGBUR NEGATIVE 02/16/2014 1333   BILIRUBINUR NEGATIVE 02/16/2014 1333   KETONESUR NEGATIVE 02/16/2014 1333   PROTEINUR NEGATIVE 02/16/2014 1333   UROBILINOGEN 0.2 02/16/2014 1333   NITRITE NEGATIVE 02/16/2014 1333   LEUKOCYTESUR NEGATIVE 02/16/2014 1333   Sepsis Labs: '@LABRCNTIP'$ (procalcitonin:4,lacticidven:4) ) Recent Results (from the past 240  hour(s))  Respiratory (~20 pathogens) panel by PCR     Status: None   Collection Time: 12/13/21 12:56 PM   Specimen: Nasopharyngeal Swab; Respiratory  Result Value Ref Range Status   Adenovirus NOT DETECTED NOT DETECTED Final   Coronavirus 229E NOT DETECTED NOT DETECTED Final    Comment: (NOTE) The Coronavirus on the Respiratory Panel, DOES NOT test for the novel  Coronavirus (2019 nCoV)    Coronavirus HKU1 NOT DETECTED NOT DETECTED Final   Coronavirus NL63 NOT DETECTED NOT DETECTED Final   Coronavirus OC43 NOT DETECTED NOT DETECTED Final   Metapneumovirus NOT DETECTED NOT DETECTED Final   Rhinovirus / Enterovirus NOT DETECTED NOT DETECTED Final   Influenza A NOT DETECTED NOT DETECTED Final   Influenza B NOT DETECTED NOT DETECTED Final   Parainfluenza Virus 1 NOT DETECTED NOT DETECTED Final   Parainfluenza Virus 2 NOT DETECTED NOT DETECTED Final   Parainfluenza Virus 3 NOT DETECTED NOT DETECTED Final   Parainfluenza Virus 4 NOT DETECTED NOT DETECTED Final   Respiratory Syncytial Virus NOT DETECTED NOT DETECTED Final   Bordetella pertussis NOT DETECTED  NOT DETECTED Final   Bordetella Parapertussis NOT DETECTED NOT DETECTED Final   Chlamydophila pneumoniae NOT DETECTED NOT DETECTED Final   Mycoplasma pneumoniae NOT DETECTED NOT DETECTED Final    Comment: Performed at [facility], [place] [address] [place], [place] [address]     Scheduled Meds:  amiodarone  200 mg Oral Daily   atorvastatin  80 mg Oral QPM   doxazosin  4 mg Oral q AM   empagliflozin  10 mg Oral Daily   finasteride  5 mg Oral QPM   furosemide  60 mg Intravenous Q12H   [START ON 12/16/2021] furosemide  40 mg Oral BID   losartan  50 mg Oral Daily   metoprolol succinate  25 mg Oral Daily   pantoprazole  40 mg Oral QPM   [START ON 12/16/2021] predniSONE  50 mg Oral Q breakfast   Continuous Infusions:  Procedures/Studies: DG Chest 2 View  Result Date: 12/12/2021 CLINICAL DATA:  BILATERAL leg swelling and  dyspnea on exertion for 1-2 weeks EXAM: CHEST - 2 VIEW COMPARISON:  12/09/2021 FINDINGS: Upper normal size of cardiac silhouette post CABG. Mediastinal contour stable. Diffuse BILATERAL pulmonary infiltrates greater in the upper lobes favoring pneumonia. Small bibasilar effusions. No pneumothorax. IMPRESSION: Persistent BILATERAL pulmonary infiltrates with upper lobe predominance consistent with pneumonia. Electronically Signed   By: Lavonia Dana [name]   On: 12/12/2021 10:56   ECHOCARDIOGRAM COMPLETE  Result Date: 12/11/2021    ECHOCARDIOGRAM REPORT   Patient Name:   Caleb Wolf Date of Exam: 12/11/2021 Medical Rec #:  [id number]    Height:       69.0 in Accession #:    [id number]   Weight:       228.2 lb Date of Birth:  1941/02/25     BSA:          2.185 m Patient Age:    87 years     BP:           125/58 mmHg Patient Gender: M            HR:           70 bpm. Exam Location:  [facility] Procedure: 2D Echo, Cardiac Doppler and Color Doppler Indications:    CHF  History:        Patient has prior history of Echocardiogram examinations, most                 recent 07/23/2021. CHF, Previous Myocardial Infarction and CAD,                 Abnormal ECG and Prior CABG, Arrythmias:Atrial Fibrillation,                 Signs/Symptoms:Chest Pain and Edema; Risk Factors:Hypertension,                 Dyslipidemia and Former Smoker.  Sonographer:    Wenda Low Referring Phys: [id number] [facility] [address] [place]  1. Septal movement consistent with postop state. Left ventricular ejection fraction, by estimation, is 45 to 50%. The left ventricle has mildly decreased function. The left ventricle demonstrates global hypokinesis. Left ventricular diastolic function could not be evaluated.  2. Right ventricular systolic function is normal. The right ventricular size is mildly enlarged. There is moderately elevated pulmonary artery systolic pressure. The estimated right ventricular systolic pressure is 27.7 mmHg.  3. Left atrial  size was moderately dilated.  4. Right atrial size was mild to moderately dilated.  5. The mitral valve is grossly normal. Trivial mitral valve regurgitation. No evidence of mitral stenosis.  6. The aortic valve is tricuspid. Aortic valve regurgitation is not visualized. Aortic valve sclerosis is present, with no evidence of aortic valve stenosis.  7. The inferior vena cava is dilated in size with >50% respiratory variability, suggesting right atrial pressure of 8 mmHg. Comparison(s): No significant change from prior study. FINDINGS  Left Ventricle: Septal movement consistent with postop state. Left ventricular ejection fraction, by estimation, is 45 to 50%. The left ventricle has mildly decreased function. The left ventricle demonstrates global hypokinesis. The left ventricular internal cavity size was normal in size. There is no left ventricular hypertrophy. Abnormal (paradoxical) septal motion consistent with post-operative status. Left ventricular diastolic function could not be evaluated due to atrial fibrillation. Left ventricular diastolic function could not be evaluated. Right Ventricle: The right ventricular size is mildly enlarged. No increase in right ventricular wall thickness. Right ventricular systolic function is normal. There is moderately elevated pulmonary artery systolic pressure. The tricuspid regurgitant velocity is 3.38 m/s, and with an assumed right atrial pressure of 8 mmHg, the estimated right ventricular systolic pressure is 10.9 mmHg. Left Atrium: Left atrial size was moderately dilated. Right Atrium: Right atrial size was mild to moderately dilated. Pericardium: There is no evidence of pericardial effusion. Mitral Valve: The mitral valve is grossly normal. Trivial mitral valve regurgitation. No evidence of mitral valve stenosis. MV peak gradient, 4.6 mmHg. The mean mitral valve gradient is 1.0 mmHg. Tricuspid Valve: The tricuspid valve is grossly normal. Tricuspid valve regurgitation is  mild . No evidence of tricuspid stenosis. Aortic Valve: The aortic valve is tricuspid. Aortic valve regurgitation is not visualized. Aortic valve sclerosis is present, with no evidence of aortic valve stenosis. Aortic valve mean gradient measures 3.0 mmHg. Aortic valve peak gradient measures 5.9  mmHg. Aortic valve area, by VTI measures 2.85 cm. Pulmonic Valve: The pulmonic valve was grossly normal. Pulmonic valve regurgitation is mild. No evidence of pulmonic stenosis. Aorta: The aortic root and ascending aorta are structurally normal, with no evidence of dilitation. Venous: The inferior vena cava is dilated in size with greater than 50% respiratory variability, suggesting right atrial pressure of 8 mmHg. IAS/Shunts: The atrial septum is grossly normal.  LEFT VENTRICLE PLAX 2D LVIDd:         5.60 cm      Diastology LVIDs:         3.60 cm      LV e' medial:    7.72 cm/s LV PW:         1.10 cm      LV E/e' medial:  11.2 LV IVS:        0.90 cm      LV e' lateral:   9.90 cm/s LVOT diam:     2.10 cm      LV E/e' lateral: 8.7 LV SV:         79 LV SV Index:   36 LVOT Area:     3.46 cm  LV Volumes (MOD) LV vol d, MOD A2C: 137.0 ml LV vol d, MOD A4C: 120.0 ml LV vol s, MOD A2C: 68.5 ml LV vol s, MOD A4C: 68.2 ml LV SV MOD A2C:     68.5 ml LV SV MOD A4C:     120.0 ml LV SV MOD BP:      60.9 ml RIGHT VENTRICLE RV Basal diam:  3.90 cm RV Mid diam:    2.80  cm RV S prime:     12.60 cm/s TAPSE (M-mode): 2.6 cm LEFT ATRIUM              Index        RIGHT ATRIUM           Index LA diam:        4.70 cm  2.15 cm/m   RA Area:     29.20 cm LA Vol (A2C):   114.0 ml 52.17 ml/m  RA Volume:   103.00 ml 47.14 ml/m LA Vol (A4C):   94.7 ml  43.34 ml/m LA Biplane Vol: 108.0 ml 49.43 ml/m  AORTIC VALVE                    PULMONIC VALVE AV Area (Vmax):    2.69 cm     PV Vmax:       0.92 m/s AV Area (Vmean):   2.69 cm     PV Peak grad:  3.4 mmHg AV Area (VTI):     2.85 cm AV Vmax:           121.00 cm/s AV Vmean:          78.700 cm/s AV  VTI:            0.276 m AV Peak Grad:      5.9 mmHg AV Mean Grad:      3.0 mmHg LVOT Vmax:         94.00 cm/s LVOT Vmean:        61.100 cm/s LVOT VTI:          0.227 m LVOT/AV VTI ratio: 0.82  AORTA Ao Root diam: 3.20 cm Ao Asc diam:  3.20 cm MITRAL VALVE               TRICUSPID VALVE MV Area (PHT): 3.27 cm    TR Peak grad:   45.7 mmHg MV Area VTI:   2.26 cm    TR Vmax:        338.00 cm/s MV Peak grad:  4.6 mmHg MV Mean grad:  1.0 mmHg    SHUNTS MV Vmax:       1.07 m/s    Systemic VTI:  0.23 m MV Vmean:      52.6 cm/s   Systemic Diam: 2.10 cm MV Decel Time: 232 msec MV E velocity: 86.50 cm/s MV A velocity: 33.00 cm/s MV E/A ratio:  2.62 Eleonore Chiquito MD Electronically signed by Eleonore Chiquito MD Signature Date/Time: 12/11/2021/4:39:55 PM    Final    CT Angio Chest PE W and/or Wo Contrast  Result Date: 12/10/2021 CLINICAL DATA:  Pulmonary embolism (PE) suspected, high prob. Shortness of breath. Cough. Chest pain. EXAM: CT ANGIOGRAPHY CHEST WITH CONTRAST TECHNIQUE: Multidetector CT imaging of the chest was performed using the standard protocol during bolus administration of intravenous contrast. Multiplanar CT image reconstructions and MIPs were obtained to evaluate the vascular anatomy. RADIATION DOSE REDUCTION: This exam was performed according to the departmental dose-optimization program which includes automated exposure control, adjustment of the mA and/or kV according to patient size and/or use of iterative reconstruction technique. CONTRAST:  68m OMNIPAQUE IOHEXOL 350 MG/ML SOLN COMPARISON:  11/27/2020 FINDINGS: Cardiovascular: Heart is mildly enlarged. No filling defects in the pulmonary arteries to suggest pulmonary emboli. Prior CABG. Aortic atherosclerosis. No aneurysm. Mediastinum/Nodes: No mediastinal, hilar, or axillary adenopathy. Trachea and esophagus are unremarkable. Thyroid unremarkable. Lungs/Pleura: Moderate bilateral pleural effusions. Compressive atelectasis in the right lower lobe.  Extensive ground-glass airspace disease in both lungs, most pronounced in the upper lobes concerning for pneumonia. Upper Abdomen: No acute findings Musculoskeletal: Chest wall soft tissues are unremarkable. No acute bony abnormality. Review of the MIP images confirms the above findings. IMPRESSION: No evidence of pulmonary embolus. Moderate bilateral pleural effusions. Bilateral ground-glass airspace opacities most pronounced in the upper lobes, left greater than right. Findings concerning for multifocal pneumonia. Aortic Atherosclerosis (ICD10-I70.0). Electronically Signed   By: Rolm Baptise [name]   On: 12/10/2021 19:28   DG Chest 2 View  Result Date: 12/10/2021 CLINICAL DATA:  Shortness of breath EXAM: CHEST - 2 VIEW COMPARISON:  November 20, 2017 FINDINGS: The heart size and mediastinal contours are stable. Patchy consolidation is identified throughout bilateral lungs. Small bilateral pleural effusions are noted. The visualized skeletal structures are stable. IMPRESSION: Patchy consolidation identified throughout bilateral lungs, suspicious for pneumonia. Electronically Signed   By: Abelardo Diesel [name]   On: 12/10/2021 09:52    Orson Eva, DO  [facility]  If 7PM-7AM, please contact night-coverage [email or web address] Password [email or web address] 12/15/2021, 3:51 PM   LOS: 5 days

## 2021-12-15 NOTE — Progress Notes (Signed)
Patients blood pressure 104/50, pulse 51, patient reports no complaints at this time. MD Tat made aware.

## 2021-12-16 ENCOUNTER — Inpatient Hospital Stay (HOSPITAL_COMMUNITY): Payer: Medicare HMO

## 2021-12-16 DIAGNOSIS — J9621 Acute and chronic respiratory failure with hypoxia: Secondary | ICD-10-CM | POA: Diagnosis not present

## 2021-12-16 DIAGNOSIS — J9601 Acute respiratory failure with hypoxia: Secondary | ICD-10-CM | POA: Diagnosis not present

## 2021-12-16 DIAGNOSIS — I4891 Unspecified atrial fibrillation: Secondary | ICD-10-CM | POA: Diagnosis not present

## 2021-12-16 DIAGNOSIS — I5022 Chronic systolic (congestive) heart failure: Secondary | ICD-10-CM | POA: Diagnosis not present

## 2021-12-16 DIAGNOSIS — N1832 Chronic kidney disease, stage 3b: Secondary | ICD-10-CM | POA: Diagnosis not present

## 2021-12-16 DIAGNOSIS — R0602 Shortness of breath: Secondary | ICD-10-CM | POA: Diagnosis not present

## 2021-12-16 LAB — ANCA PROFILE
Anti-MPO Antibodies: <0.2 units (ref 0.0–0.9)
Anti-PR3 Antibodies: <0.2 units (ref 0.0–0.9)
Atypical P-ANCA titer: <1:20 {titer}
C-ANCA: <1:20 {titer}
P-ANCA: <1:20 {titer}

## 2021-12-16 LAB — BASIC METABOLIC PANEL
Anion gap: 9 (ref 5–15)
BUN: 63 mg/dL — ABNORMAL HIGH (ref 8–23)
CO2: 30 mmol/L (ref 22–32)
Calcium: 8.3 mg/dL — ABNORMAL LOW (ref 8.9–10.3)
Chloride: 100 mmol/L (ref 98–111)
Creatinine, Ser: 2.16 mg/dL — ABNORMAL HIGH (ref 0.61–1.24)
GFR, Estimated: 30 mL/min — ABNORMAL LOW (ref >60)
Glucose, Bld: 134 mg/dL — ABNORMAL HIGH (ref 70–99)
Potassium: 4.2 mmol/L (ref 3.5–5.1)
Sodium: 139 mmol/L (ref 135–145)

## 2021-12-16 MED ORDER — FUROSEMIDE 40 MG PO TABS: 40.0000 mg | ORAL_TABLET | Freq: Every day | ORAL | 1 refills | Status: DC

## 2021-12-16 MED ORDER — PREDNISONE 10 MG PO TABS: 60.0000 mg | ORAL_TABLET | Freq: Every day | ORAL | 0 refills | Status: DC

## 2021-12-16 MED ORDER — FUROSEMIDE 40 MG PO TABS: 40.0000 mg | ORAL_TABLET | Freq: Every day | ORAL | Status: DC

## 2021-12-16 MED ORDER — RIVAROXABAN 15 MG PO TABS: 15.0000 mg | ORAL_TABLET | Freq: Every day | ORAL | Status: DC

## 2021-12-16 MED ORDER — RIVAROXABAN 15 MG PO TABS: 15.0000 mg | ORAL_TABLET | Freq: Every day | ORAL | 1 refills | Status: DC

## 2021-12-16 MED ORDER — METOPROLOL SUCCINATE ER 25 MG PO TB24: 25.0000 mg | ORAL_TABLET | Freq: Every day | ORAL | 1 refills | Status: DC

## 2021-12-16 NOTE — Progress Notes (Signed)
Nsg Discharge Note  Admit Date:  12/10/2021 Discharge date: 12/16/2021   Quin Hoop Och to be D/C'd Home per MD order.  AVS completed.  Copy for chart, and copy for patient signed, and dated. Patient/caregiver able to verbalize understanding.  Discharge Medication: Allergies as of 12/16/2021       Reactions   Codeine Sulfate Nausea Only        Medication List     STOP taking these medications    diltiazem 120 MG 24 hr capsule Commonly known as: CARDIZEM CD   lisinopril 40 MG tablet Commonly known as: ZESTRIL       TAKE these medications    acetaminophen 500 MG tablet Commonly known as: TYLENOL Take 1,000 mg by mouth every 6 (six) hours as needed for moderate pain.   amiodarone 200 MG tablet Commonly known as: PACERONE Take 1 tablet (200 mg total) by mouth daily.   amoxicillin 500 MG capsule Commonly known as: AMOXIL Take 2,000 mg by mouth See admin instructions. Take 4 capsules (2000 mg) by mouth before dental procedures   atorvastatin 80 MG tablet Commonly known as: LIPITOR TAKE 1 TABLET BY MOUTH EVERY EVENING   colchicine 0.6 MG tablet Take 0.6 mg by mouth daily as needed (for gout flare up).   doxazosin 4 MG tablet Commonly known as: CARDURA Take 4 mg by mouth in the morning.   finasteride 5 MG tablet Commonly known as: PROSCAR Take 5 mg by mouth every evening.   fluticasone 50 MCG/ACT nasal spray Commonly known as: FLONASE Place 1 spray into both nostrils daily as needed for allergies or rhinitis.   furosemide 40 MG tablet Commonly known as: LASIX Take 1 tablet (40 mg total) by mouth daily. Start taking on: December 17, 2021 What changed:  when to take this Another medication with the same name was removed. Continue taking this medication, and follow the directions you see here.   metoprolol succinate 25 MG 24 hr tablet Commonly known as: TOPROL-XL Take 1 tablet (25 mg total) by mouth daily. Start taking on: December 17, 2021   nitroGLYCERIN 0.4 MG  SL tablet Commonly known as: NITROSTAT Place 1 tablet (0.4 mg total) under the tongue every 5 (five) minutes x 3 doses as needed for chest pain.   pantoprazole 40 MG tablet Commonly known as: PROTONIX Take 1 tablet (40 mg total) by mouth daily before breakfast. What changed: when to take this   predniSONE 10 MG tablet Commonly known as: DELTASONE Take 6 tablets (60 mg total) by mouth daily with breakfast. And decrease by one tablet daily Start taking on: December 17, 2021   Rivaroxaban 15 MG Tabs tablet Commonly known as: XARELTO Take 1 tablet (15 mg total) by mouth daily with supper. What changed:  medication strength how much to take               Durable Medical Equipment  (From admission, onward)           Start     Ordered   12/16/21 1223  For home use only DME oxygen  Once       Question Answer Comment  Length of Need 6 Months   Mode or (Route) Nasal cannula   Liters per Minute 2   Frequency Continuous (stationary and portable oxygen unit needed)   Oxygen delivery system Gas      12/16/21 1222            Discharge Assessment: Vitals:   12/16/21 0017  12/16/21 0430  BP: (!) 121/56 (!) 126/54  Pulse: (!) 51 (!) 48  Resp: 17 18  Temp: (!) 97.5 F (36.4 C) (!) 97.5 F (36.4 C)  SpO2: 98% 97%   Skin clean, dry and intact without evidence of skin break down, no evidence of skin tears noted. IV catheter discontinued intact. Site without signs and symptoms of complications - no redness or edema noted at insertion site, patient denies c/o pain - only slight tenderness at site.  Dressing with slight pressure applied.  D/c Instructions-Education: Discharge instructions given to patient/family with verbalized understanding. D/c education completed with patient/family including follow up instructions, medication list, d/c activities limitations if indicated, with other d/c instructions as indicated by MD - patient able to verbalize understanding, all questions  fully answered. Patient instructed to return to ED, call 911, or call MD for any changes in condition.  Patient escorted via [place], and D/C home via private auto.  Radene Gunning King,LPN 6/71/2458 0:99 PM

## 2021-12-16 NOTE — Progress Notes (Signed)
Progress Note  Patient Name: Caleb Wolf Date of Encounter: 12/16/2021  [facility] Cardiologist: Candee Furbish, MD   Subjective   He feels ready to go home. He remains on O2. 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  Inpatient Medications    Scheduled Meds:  amiodarone  200 mg Oral Daily   atorvastatin  80 mg Oral QPM   doxazosin  4 mg Oral q AM   finasteride  5 mg Oral QPM   furosemide  40 mg Oral BID   metoprolol succinate  25 mg Oral Daily   pantoprazole  40 mg Oral QPM   predniSONE  50 mg Oral Q breakfast   Continuous Infusions:  PRN Meds: guaiFENesin, hydrALAZINE, levalbuterol, metoprolol tartrate, senna-docusate, traZODone   Vital Signs    Vitals:   12/15/21 2000 12/16/21 0017 12/16/21 0430 12/16/21 0436  BP: 121/61 (!) 121/56 (!) 126/54   Pulse: (!) 51 (!) 51 (!) 48   Resp: '18 17 18   '$ Temp: 97.7 F (36.5 C) (!) 97.5 F (36.4 C) (!) 97.5 F (36.4 C)   TempSrc: Oral  Oral   SpO2: 99% 98% 97%   Weight:    102.1 kg  Height:        Intake/Output Summary (Last 24 hours) at 12/16/2021 0945 Last data filed at 12/16/2021 0500 Gross per 24 hour  Intake 240 ml  Output 1700 ml  Net -1460 ml      12/16/2021    4:36 AM 12/15/2021    6:58 AM 12/14/2021    7:21 AM  Last 3 Weights  Weight (lbs) 225 lb 226 lb 10.1 oz 239 lb 13.8 oz  Weight (kg) 102.059 kg 102.8 kg 108.8 kg      Telemetry    NSR.  - Personally Reviewed  ECG    No new tracings.   Physical Exam   GEN: Pleasant elderly male appearing in no acute distress.   Neck: No JVD Cardiac: RRR, no murmurs, rubs, or gallops.  Respiratory: Decreased breath sounds along bases bilaterally.  GI: Soft, nontender, non-distended  MS: trace edema compression stocking on; No deformity. Compression stockings in place.  Neuro:  Nonfocal  Psych: Normal affect   Labs    High Sensitivity Troponin:   Recent Labs  Lab 12/10/21 1700 12/10/21 1927  TROPONINIHS 24* 25*      Chemistry Recent Labs  Lab 12/10/21 1700 12/11/21 0536 12/12/21 0509 12/13/21 0848 12/14/21 0945 12/15/21 0434 12/16/21 0433  NA 139 141 139   < > 140 139 139  K 4.2 4.2 4.0   < > 4.2 4.8 4.2  CL 106 103 103   < > 101 100 100  CO2 '25 28 28   '$ < > '29 29 30  '$ GLUCOSE 111* 112* 114*   < > 188* 134* 134*  BUN 39* 38* 35*   < > 45* 54* 63*  CREATININE 1.99* 1.93* 1.92*   < > 2.01* 2.05* 2.16*  CALCIUM 8.3* 8.5* 8.1*   < > 8.8* 8.5* 8.3*  MG  --   --  2.1  --   --  2.4  --   PROT 5.9* 5.9*  --   --   --   --   --   ALBUMIN 2.8* 2.8*  --   --   --   --   --   AST 18 16  --   --   --   --   --  ALT 28 24  --   --   --   --   --   ALKPHOS 95 96  --   --   --   --   --   BILITOT 1.2 1.0  --   --   --   --   --   GFRNONAA 34* 35* 35*   < > 33* 32* 30*  ANIONGAP '8 10 8   '$ < > '10 10 9   '$ < > = values in this interval not displayed.    Lipids No results for input(s): "CHOL", "TRIG", "HDL", "LABVLDL", "LDLCALC", "CHOLHDL" in the last 168 hours.  Hematology Recent Labs  Lab 12/11/21 0536 12/12/21 0509 12/13/21 0848  WBC 8.0 8.1 9.4  RBC 2.91* 2.76* 2.99*  HGB 8.7* 8.3* 8.8*  HCT 28.1* 26.3* 28.6*  MCV 96.6 95.3 95.7  MCH 29.9 30.1 29.4  MCHC 31.0 31.6 30.8  RDW 14.8 15.0 14.7  PLT 242 261 279   Thyroid No results for input(s): "TSH", "FREET4" in the last 168 hours.  BNP Recent Labs  Lab 12/10/21 1700 12/13/21 1308  BNP 521.0* 685.0*    DDimer No results for input(s): "DDIMER" in the last 168 hours.   Radiology    DG Chest 2 View  Result Date: 12/12/2021 CLINICAL DATA:  BILATERAL leg swelling and dyspnea on exertion for 1-2 weeks EXAM: CHEST - 2 VIEW COMPARISON:  12/09/2021 FINDINGS: Upper normal size of cardiac silhouette post CABG. Mediastinal contour stable. Diffuse BILATERAL pulmonary infiltrates greater in the upper lobes favoring pneumonia. Small bibasilar effusions. No pneumothorax. IMPRESSION: Persistent BILATERAL pulmonary infiltrates with upper lobe predominance  consistent with pneumonia. Electronically Signed   By: Lavonia Dana [name]   On: 12/12/2021 10:56   Cardiac Studies   Echocardiogram: 12/11/2021 IMPRESSIONS     1. Septal movement consistent with postop state. Left ventricular  ejection fraction, by estimation, is 45 to 50%. The left ventricle has  mildly decreased function. The left ventricle demonstrates global  hypokinesis. Left ventricular diastolic function  could not be evaluated.   2. Right ventricular systolic function is normal. The right ventricular  size is mildly enlarged. There is moderately elevated pulmonary artery  systolic pressure. The estimated right ventricular systolic pressure is  95.6 mmHg.   3. Left atrial size was moderately dilated.   4. Right atrial size was mild to moderately dilated.   5. The mitral valve is grossly normal. Trivial mitral valve  regurgitation. No evidence of mitral stenosis.   6. The aortic valve is tricuspid. Aortic valve regurgitation is not  visualized. Aortic valve sclerosis is present, with no evidence of aortic  valve stenosis.   7. The inferior vena cava is dilated in size with >50% respiratory  variability, suggesting right atrial pressure of 8 mmHg.   Comparison(s): No significant change from prior study.   Patient Profile     81 y.o. 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 Cardiology consulted for CHF.   Assessment & Plan    1. Acute HFmrEF - He has a known cardiomyopathy dating back to 2018 with EF 40-45% at that time and EF was previously at 50 to 55% in 07/2021. Repeat echocardiogram this admission overall similar with EF  at 45 to 50% with global hypokinesis. - Initially treated for PNA but  antibiotics were stopped given negative procalcitonin. Repeat CXR yesterday showed persistent bilateral pulmonary infiltrates in the upper lobe consistent with PNA and will defer possible antibiotic treatment to the admitting team.  - Was diuresing using ReDS vest.  Weight 239->225 pounds.  With ~6 days of diuresis. 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 Crt 1.9 on admission and in Jan. may be new normal, up to 2.16 likely dry. Cut back on oral lasix to 40 mg daily. Recommend FU BMET in 2 weeks   2. CAD - He is s/p CABG in 2015. He was having dyspnea prior to admission but denies any recent exertional chest pain. Troponin values have been flat at 24 and 25 which is not consistent with ACS.  - Continue Toprol-XL '25mg'$  daily and Atorvastatin '80mg'$  daily. Not on ASA due to being on Xarelto.    3. Persistent Atrial Fibrillation - in sinus rhythm - He is s/p ablation in 2019 with repeat in 11/2020. He did have recurrent atrial fibrillation afterwards and underwent DCCV in 06/2021. In NSR by review of telemetry. Remains on Amiodarone '200mg'$  daily and Cardizem CD was discontinued given his cardiomyopathy and switched to Toprol-XL '25mg'$  daily.  - xarelto held 2/2 hemoptysis; On Xarelto '15mg'$  daily for anticoagulation and dosing was reduced by pharmacy this admission given his renal function.  - recommended restarting xarelto prior to discharge  4. HTN - BP has been variable from 124/49 - 143/54 within the past 24 hours. Was previously on Lisinopril and this was switched to Losartan 50 mg (held 2/2 renal fxn per above) and Cardizem has been transitioned to Toprol-XL as discussed above.    5. Stage 3 CKD - His baseline creatinine appears to be between 1.7 - 1.9. Was elevated to 1.99  on admission.   For questions or updates, please contact [place] Please consult [email or web address] for contact info under        Signed, Janina Mayo, MD  12/16/2021, 9:45 AM

## 2021-12-16 NOTE — Care Management Important Message (Signed)
Important Message  Patient Details  Name: Caleb Wolf MRN: [id number] Date of Birth: [birth year]   Medicare Important Message Given:  Yes     Tommy Medal 12/16/2021, 2:00 PM

## 2021-12-16 NOTE — TOC Initial Note (Signed)
Transition of Care [facility]) - Initial/Assessment Note    Patient Details  Name: Caleb Wolf MRN: [id number] Date of Birth: Jul 22, 1940  Transition of Care [facility]) CM/SW Contact:    Boneta Lucks, RN Phone Number: 12/16/2021, 11:52 AM  Clinical Narrative:    Patient discharging home, qualifying for home oxygen. TOC at the bedside. Patient accepted [employer] for DME.  Referral made to [facility], tank delivered to the room. Ashly will follow up for home set up.              Barriers to Discharge: Barriers Resolved  Patient Goals and CMS Choice Patient states their goals for this hospitalization and ongoing recovery are:: to go home. CMS Medicare.gov Compare Post Acute Care list provided to:: Patient Choice offered to / list presented to : Patient  Expected Discharge Plan and Services       Living arrangements for the past 2 months: Single Family Home Expected Discharge Date: 12/16/21               DME Arranged: Oxygen DME Agency: [employer] Date DME Agency Contacted: 12/16/21 Time DME Agency Contacted: 3 Representative spoke with at DME Agency: Ashly       Prior Living Arrangements/Services Living arrangements for the past 2 months: [place] with:: Spouse   Activities of Daily Living Home Assistive Devices/Equipment: Environmental consultant (specify type), Cane (specify quad or straight) (Rollator) ADL Screening (condition at time of admission) Patient's cognitive ability adequate to safely complete daily activities?: Yes Is the patient deaf or have difficulty hearing?: No Does the patient have difficulty seeing, even when wearing glasses/contacts?: No Does the patient have difficulty concentrating, remembering, or making decisions?: No Patient able to express need for assistance with ADLs?: Yes Does the patient have difficulty dressing or bathing?: Yes Independently performs ADLs?: No Communication: Independent Dressing (OT): Needs assistance Is this a change from baseline?:  Change from baseline, expected to last <3days Grooming: Independent Feeding: Independent Bathing: Needs assistance Is this a change from baseline?: Change from baseline, expected to last <3 days Toileting: Needs assistance Is this a change from baseline?: Change from baseline, expected to last <3 days In/Out Bed: Needs assistance Is this a change from baseline?: Change from baseline, expected to last <3 days Walks in Home: Independent Does the patient have difficulty walking or climbing stairs?: Yes Weakness of Legs: Both Weakness of Arms/Hands: None  Permission Sought/Granted  Emotional Assessment     Admission diagnosis:  Pneumonia [J18.9] Patient Active Problem List   Diagnosis Date Noted   Chronic kidney disease, stage 3b ([place]) 12/13/2021   Hemoptysis 12/13/2021   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) ([place]) 12/13/2021   CAD (coronary artery disease) 12/10/2021   Acute respiratory failure with hypoxia (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   Vitreous floaters, left 02/13/2020   History of vitrectomy 02/13/2020   Left epiretinal membrane 02/13/2020   HLD (hyperlipidemia) 12/07/2019   Low back pain 11/15/2019   Lumbar post-laminectomy syndrome 11/15/2019   Spinal stenosis of lumbar region 11/15/2019   Pain in joint of right hip 06/09/2019   Trigger finger 04/08/2019   Special screening for malignant neoplasms, colon 11/25/2017   Absolute anemia 11/25/2017   Persistent atrial fibrillation ([place]) 07/28/2017   GERD (gastroesophageal reflux disease) 04/28/2017   AKI (acute kidney injury) ([place]) 04/28/2017   Achalasia 12/18/2015   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   Encounter for therapeutic drug monitoring 05/31/2014  Atrial fibrillation with rapid ventricular response ([place]) 05/22/2014   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   Abnormal EKG    NSTEMI (non-ST elevated myocardial infarction) [facility])    Carotid  artery stenosis without cerebral infarction    Benign non-nodular prostatic hyperplasia with lower urinary tract symptoms 03/27/2014   Chest pain 02/09/2014   HTN (hypertension) 02/09/2014   Carotid stenosis 08/02/2013   Peripheral edema 08/02/2013   Urge incontinence of urine 10/04/2012   OSA (obstructive sleep apnea) 09/21/2012   Recurrent nephrolithiasis 04/05/2012   Difficulty walking 01/29/2012   Balance disorder 01/29/2012   OA (osteoarthritis) of hip 12/08/2011   History of revision of total replacement of right hip joint 12/08/2011   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

## 2021-12-16 NOTE — Progress Notes (Deleted)
Progress Note  Patient Name: Caleb Wolf Date of Encounter: 12/16/2021  [facility] Cardiologist: Candee Furbish, MD   Subjective   No chest pain or SOB  Inpatient Medications    Scheduled Meds:  amiodarone  200 mg Oral Daily   atorvastatin  80 mg Oral QPM   doxazosin  4 mg Oral q AM   empagliflozin  10 mg Oral Daily   finasteride  5 mg Oral QPM   furosemide  40 mg Oral BID   losartan  50 mg Oral Daily   metoprolol succinate  25 mg Oral Daily   pantoprazole  40 mg Oral QPM   predniSONE  50 mg Oral Q breakfast   Continuous Infusions:  PRN Meds: guaiFENesin, hydrALAZINE, levalbuterol, metoprolol tartrate, senna-docusate, traZODone   Vital Signs    Vitals:   12/15/21 2000 12/16/21 0017 12/16/21 0430 12/16/21 0436  BP: 121/61 (!) 121/56 (!) 126/54   Pulse: (!) 51 (!) 51 (!) 48   Resp: '18 17 18   '$ Temp: 97.7 F (36.5 C) (!) 97.5 F (36.4 C) (!) 97.5 F (36.4 C)   TempSrc: Oral  Oral   SpO2: 99% 98% 97%   Weight:    102.1 kg  Height:        Intake/Output Summary (Last 24 hours) at 12/16/2021 0856 Last data filed at 12/16/2021 0500 Gross per 24 hour  Intake 240 ml  Output 1700 ml  Net -1460 ml      12/16/2021    4:36 AM 12/15/2021    6:58 AM 12/14/2021    7:21 AM  Last 3 Weights  Weight (lbs) 225 lb 226 lb 10.1 oz 239 lb 13.8 oz  Weight (kg) 102.059 kg 102.8 kg 108.8 kg      Telemetry    SR - Personally Reviewed  ECG    No new - Personally Reviewed  Physical Exam  Exam per Dr. Beckie Busing GEN: No acute distress.   Neck: No JVD Cardiac: RRR, no murmurs, rubs, or gallops.  Respiratory: Clear to auscultation bilaterally. GI: Soft, nontender, non-distended  MS: No edema; No deformity. Neuro:  Nonfocal  Psych: Normal affect   Labs    High Sensitivity Troponin:   Recent Labs  Lab 12/10/21 1700 12/10/21 1927  TROPONINIHS 24* 25*     Chemistry Recent Labs  Lab 12/10/21 1700 12/11/21 0536 12/12/21 0509 12/13/21 0848 12/14/21 0945  12/15/21 0434 12/16/21 0433  NA 139 141 139   < > 140 139 139  K 4.2 4.2 4.0   < > 4.2 4.8 4.2  CL 106 103 103   < > 101 100 100  CO2 '25 28 28   '$ < > '29 29 30  '$ GLUCOSE 111* 112* 114*   < > 188* 134* 134*  BUN 39* 38* 35*   < > 45* 54* 63*  CREATININE 1.99* 1.93* 1.92*   < > 2.01* 2.05* 2.16*  CALCIUM 8.3* 8.5* 8.1*   < > 8.8* 8.5* 8.3*  MG  --   --  2.1  --   --  2.4  --   PROT 5.9* 5.9*  --   --   --   --   --   ALBUMIN 2.8* 2.8*  --   --   --   --   --   AST 18 16  --   --   --   --   --   ALT 28 24  --   --   --   --   --  ALKPHOS 95 96  --   --   --   --   --   BILITOT 1.2 1.0  --   --   --   --   --   GFRNONAA 34* 35* 35*   < > 33* 32* 30*  ANIONGAP '8 10 8   '$ < > '10 10 9   '$ < > = values in this interval not displayed.    Lipids No results for input(s): "CHOL", "TRIG", "HDL", "LABVLDL", "LDLCALC", "CHOLHDL" in the last 168 hours.  Hematology Recent Labs  Lab 12/11/21 0536 12/12/21 0509 12/13/21 0848  WBC 8.0 8.1 9.4  RBC 2.91* 2.76* 2.99*  HGB 8.7* 8.3* 8.8*  HCT 28.1* 26.3* 28.6*  MCV 96.6 95.3 95.7  MCH 29.9 30.1 29.4  MCHC 31.0 31.6 30.8  RDW 14.8 15.0 14.7  PLT 242 261 279   Thyroid No results for input(s): "TSH", "FREET4" in the last 168 hours.  BNP Recent Labs  Lab 12/10/21 1700 12/13/21 1308  BNP 521.0* 685.0*    DDimer No results for input(s): "DDIMER" in the last 168 hours.   Radiology    No results found.  Cardiac Studies   Echocardiogram: 07/2021 IMPRESSIONS     1. Left ventricular ejection fraction, by estimation, is 50 to 55%. The  left ventricle has low normal function. The left ventricle has no regional  wall motion abnormalities. Left ventricular diastolic function could not  be evaluated. There is the  interventricular septum is flattened in systole, consistent with right  ventricular pressure overload.   2. Right ventricular systolic function is normal. The right ventricular  size is normal. There is moderately elevated pulmonary  artery systolic  pressure. The estimated right ventricular systolic pressure is 94.1 mmHg.   3. Left atrial size was severely dilated.   4. Right atrial size was severely dilated.   5. The mitral valve is abnormal. Trivial mitral valve regurgitation.   6. The aortic valve is tricuspid. Aortic valve regurgitation is not  visualized. Aortic valve sclerosis is present, with no evidence of aortic  valve stenosis.   7. The inferior vena cava is dilated in size with <50% respiratory  variability, suggesting right atrial pressure of 15 mmHg.   Comparison(s): Changes from prior study are noted. 12/19/2019: LVDF 50-55%,  RVSP 48 mmHg.    Echocardiogram: 12/11/2021 IMPRESSIONS     1. Septal movement consistent with postop state. Left ventricular  ejection fraction, by estimation, is 45 to 50%. The left ventricle has  mildly decreased function. The left ventricle demonstrates global  hypokinesis. Left ventricular diastolic function  could not be evaluated.   2. Right ventricular systolic function is normal. The right ventricular  size is mildly enlarged. There is moderately elevated pulmonary artery  systolic pressure. The estimated right ventricular systolic pressure is  74.0 mmHg.   3. Left atrial size was moderately dilated.   4. Right atrial size was mild to moderately dilated.   5. The mitral valve is grossly normal. Trivial mitral valve  regurgitation. No evidence of mitral stenosis.   6. The aortic valve is tricuspid. Aortic valve regurgitation is not  visualized. Aortic valve sclerosis is present, with no evidence of aortic  valve stenosis.   7. The inferior vena cava is dilated in size with >50% respiratory  variability, suggesting right atrial pressure of 8 mmHg.   Comparison(s): No significant change from prior study.     Patient Profile     81 y.o. 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    Assessment & Plan    Acute HFmrEF with EF 40-45% in 2018 did improved to 50-55% but now back to 45-50% with global hypokinesis. -initially treated with ABX for PNA but felt symptoms more with CHF.   -9,480 since admit wt down from 108.8 to 102.1 Kg.  -now on lasix 40 mg po BID (was on lasix 60 BID IV. Changed today to oral. - BNP on the 15th was 685. -jardiance has been added.   CAD - He is s/p CABG in 2015. He was having dyspnea prior to admission but denies any recent exertional chest pain. Troponin values have been flat at 24 and 25. If he continues to have fatigue following diuresis, could consider an outpatient Lexiscan Myoview for repeat ischemic evaluation. - He has been continued on Atorvastatin 80 mg daily. No longer on ASA given the need for anticoagulation.   Persistent Atrial Fibrillation - He is s/p ablation in 2019 with repeat in 11/2020. He did have recurrent atrial fibrillation afterwards and underwent DCCV in 06/2021. Maintaining normal sinus rhythm this admission. - He remains on Cardizem CD 120 mg daily and Amiodarone '200mg'$  daily. With his EF dilt was changed to BB.  NOT On Xarelto 15 mg daily (dose reduction this admission secondary to kidney function) and holding due to hemoptysis. Marland Kitchen   HTN - BP has overall been well controlled, at 121/56 - 126/54 within the past 24 hours. Was on Lisinopril but changed to cozaar in anticipation of entresto.  Though with Cr 2.16 may need to hold.    Stage 3 CKD - Baseline 1.7 - 1.8. Elevated at 1.99 on admission and now elevated to 2.16 today. Continue to follow with diuresis  Anemia per IM.  Last hgb 8.8   Hemoptysis Pulmonary consult obtained.  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 Annamaria Boots as outpt.      For questions or updates, please contact [place] Please consult [email or web address] for contact info under        Signed, Cecilie Kicks, NP  12/16/2021, 8:56 AM

## 2021-12-16 NOTE — Progress Notes (Signed)
SATURATION QUALIFICATIONS: (This note is used to comply with regulatory documentation for home oxygen)  Patient Saturations on Room Air at Rest = 95%  Patient Saturations on Room Air while Ambulating = 80%  Patient Saturations on 2 Liters of oxygen while Ambulating = 93%  Please briefly explain why patient needs home oxygen:

## 2021-12-16 NOTE — Progress Notes (Signed)
SATURATION QUALIFICATIONS: (This note is used to comply with regulatory documentation for home oxygen)   Patient Saturations on Room Air at Rest = 91   Patient Saturations on Room Air while Ambulating = 85   Patient Saturations on 2 Liters of oxygen while Ambulating = 95   Please briefly explain why patient needs home oxygen: To maintain 02 sat at 90% or above during ambulation.  Orson Eva, DO

## 2021-12-16 NOTE — Progress Notes (Signed)
PT Cancellation Note  Patient Details Name: Caleb Wolf MRN: [id number] DOB: [birth year]   Cancelled Treatment:    Reason Eval/Treat Not Completed: PT screened, no needs identified, will sign off.  Patient seen by PT staff last week and discharged to nursing for ambulation daily as tolerated using his cane.   11:58 AM, 12/16/21 Lonell Grandchild, MPT Physical Therapist with [facility] [phone] office [phone] mobile phone

## 2021-12-19 DIAGNOSIS — J9601 Acute respiratory failure with hypoxia: Secondary | ICD-10-CM | POA: Diagnosis not present

## 2021-12-19 DIAGNOSIS — I5020 Unspecified systolic (congestive) heart failure: Secondary | ICD-10-CM | POA: Diagnosis not present

## 2021-12-31 ENCOUNTER — Ambulatory Visit: Payer: Medicare HMO | Admitting: Primary Care

## 2021-12-31 ENCOUNTER — Encounter: Payer: Self-pay | Admitting: Primary Care

## 2021-12-31 ENCOUNTER — Ambulatory Visit (INDEPENDENT_AMBULATORY_CARE_PROVIDER_SITE_OTHER): Payer: Medicare HMO

## 2021-12-31 DIAGNOSIS — G4733 Obstructive sleep apnea (adult) (pediatric): Secondary | ICD-10-CM

## 2021-12-31 DIAGNOSIS — R918 Other nonspecific abnormal finding of lung field: Secondary | ICD-10-CM

## 2021-12-31 DIAGNOSIS — I4819 Other persistent atrial fibrillation: Secondary | ICD-10-CM

## 2021-12-31 DIAGNOSIS — I5020 Unspecified systolic (congestive) heart failure: Secondary | ICD-10-CM

## 2021-12-31 DIAGNOSIS — J90 Pleural effusion, not elsewhere classified: Secondary | ICD-10-CM | POA: Diagnosis not present

## 2021-12-31 DIAGNOSIS — I5022 Chronic systolic (congestive) heart failure: Secondary | ICD-10-CM | POA: Diagnosis not present

## 2021-12-31 DIAGNOSIS — R042 Hemoptysis: Secondary | ICD-10-CM | POA: Diagnosis not present

## 2021-12-31 DIAGNOSIS — J9601 Acute respiratory failure with hypoxia: Secondary | ICD-10-CM

## 2021-12-31 NOTE — Assessment & Plan Note (Signed)
-   Improved; SpO2 low 96% on room air today with simple walk test  - Titrate O2 to maintain oxygen level >88-90% - Checking ONO on CPAP in 2 weeks to see if we can discontinue oxygen

## 2021-12-31 NOTE — Assessment & Plan Note (Signed)
-   Patient is compliant with CPAP. Current pressure 5-20cm h20 with residual AHI 12. He is having more central apneas and obstruction which could be form heart failure. Monitor for now, may want to have titration study in lab if continues. FU in 2-3 months with Dr. Annamaria Boots.

## 2021-12-31 NOTE — Assessment & Plan Note (Addendum)
-   Hosp for CHF exacerbation in July. BNP was 685 - Cont lasix '40mg'$  daily - Repeat BNP today  - Follow up with cardiology tomorrow

## 2021-12-31 NOTE — Assessment & Plan Note (Addendum)
-   Resolved; No further episodes of hemoptysis. Felt to be d/t airway irritation from inflammatory process causing mild hemoptysis. 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 Pulmonary did not recommend bronchoscopy. ANCA, ANA and RF were negative.

## 2021-12-31 NOTE — Progress Notes (Signed)
$'@Patient'e$  ID: Caleb Wolf, male    DOB: 04-28-1941, 81 y.o.   MRN: [id number]  Chief Complaint  Patient presents with   Hospitalization Follow-up    Discharged last Monday. Using 2 L of O2 as needed.    Referring provider: Asencion Noble, MD  HPI: 81 year old male.  Past medical history significant for heart failure, A-fib, hypertension, coronary artery disease, OSA, acute respiratory failure with hypoxia, GERD.  Patient of Dr. Annamaria Boots, follow-up for OSA.  12/31/2021 Patient presents today for hospital follow-up.  Patient was admitted to [facility] on 7/11 with dyspnea and chest pain.  Started 2 weeks prior to admission with leg swelling and orthopnea.  He was found to have new systolic heart failure, cardiology consulted.  He is on Xarelto for A-fib.  He was noted to have persistent O2 needs and developed hemoptysis.  He recently traveled to [place] in June and was fine prior to this.  Symptoms are not typical for bacterial pneumonia.  Procalcitonin was negative, antibiotics were discontinued.  Concern for inflammatory process which could be postviral Boop or other environmental exposures when travel to [place].  He also has a history of A-fib and is on amiodarone and Xarelto.  Could have pneumonitis from amiodarone but less likely.  Airway irritation from inflammatory process causing mild hemoptysis.  He is doing very well today. Breathing has improved. No further hemoptysis. His oxygen levels have been in mid 90s off O2. He wears 2L oxygen at night with CPAP. Currently using old resmed machine. He received a new travel CPAP machine. He completed prednisone course. He remains lasix '40mg'$  daily. He is following reduced sodium diet. He will be seeing cardiology tomorrow.   Respiratory viral panel was negative Sed rate 75, elevated ANA, rheumatoid factor and ANCA were negative BNP 685 on 7/14  Airview download 11/29/21-12/28/21 Usage 15/30 days; 50% > 4 hours (he was admitted in July) Average  usage days used 11 hour 40 mins Pressure 5-20cm h20 (13.3cm h20-95%) Apnea index- 6.7 central; 1.2 obstructive AHI 12.0   Allergies  Allergen Reactions   Codeine Sulfate Nausea Only    Immunization History  Administered Date(s) Administered   Fluad Quad(high Dose 65+) 03/13/2021   Influenza Split 03/02/2012, 03/16/2014, 03/14/2015   Influenza, High Dose Seasonal PF 03/28/2019   Influenza,inj,Quad PF,6+ Mos 03/08/2013   Influenza,inj,quad, With Preservative 03/02/2017   Moderna Sars-Covid-2 Vaccination 06/14/2019, 07/15/2019, 02/12/2020   Pneumococcal Conjugate-13 06/02/2002, 02/01/2008   Td 06/02/2008   Zoster, Live 06/03/2007    Past Medical History:  Diagnosis Date   AAA (abdominal aortic aneurysm) ([place])    Arthritis    Basal cell carcinoma    Carotid stenosis    Chronic kidney disease, stage 3b ([place]) 12/13/2021   Coronary artery disease    Multvessel s/p CABG 2015   Enlarged prostate    Essential hypertension    GERD (gastroesophageal reflux disease)    Gout    History of colon polyps    History of kidney stones    Hyperlipidemia    Lumbar disc disease    Persistent atrial fibrillation ([place])    Sleep apnea    CPAP    Tobacco History: Social History   Tobacco Use  Smoking Status Former   Packs/day: 1.50   Years: 25.00   Total pack years: 37.50   Types: Cigarettes   Start date: 08/08/1960   Quit date: 06/02/1982   Years since quitting: 39.6  Smokeless Tobacco Never  Tobacco Comments  quit smoking 30+yrs ago   Counseling given: Not Answered Tobacco comments: quit smoking 30+yrs ago   Outpatient Medications Prior to Visit  Medication Sig Dispense Refill   acetaminophen (TYLENOL) 500 MG tablet Take 1,000 mg by mouth every 6 (six) hours as needed for moderate pain.     amiodarone (PACERONE) 200 MG tablet Take 1 tablet (200 mg total) by mouth daily. 90 tablet 1   amoxicillin (AMOXIL) 500 MG capsule Take 2,000 mg by mouth See admin instructions. Take 4  capsules (2000 mg) by mouth before dental procedures     atorvastatin (LIPITOR) 80 MG tablet TAKE 1 TABLET BY MOUTH EVERY EVENING 90 tablet 3   colchicine 0.6 MG tablet Take 0.6 mg by mouth daily as needed (for gout flare up).     doxazosin (CARDURA) 4 MG tablet Take 4 mg by mouth in the morning.  4   finasteride (PROSCAR) 5 MG tablet Take 5 mg by mouth every evening.      fluticasone (FLONASE) 50 MCG/ACT nasal spray Place 1 spray into both nostrils daily as needed for allergies or rhinitis.     furosemide (LASIX) 40 MG tablet Take 1 tablet (40 mg total) by mouth daily. 30 tablet 1   metoprolol succinate (TOPROL-XL) 25 MG 24 hr tablet Take 1 tablet (25 mg total) by mouth daily. 30 tablet 1   nitroGLYCERIN (NITROSTAT) 0.4 MG SL tablet Place 1 tablet (0.4 mg total) under the tongue every 5 (five) minutes x 3 doses as needed for chest pain. 75 tablet 3   pantoprazole (PROTONIX) 40 MG tablet Take 1 tablet (40 mg total) by mouth daily before breakfast. (Patient taking differently: Take 40 mg by mouth every evening.) 90 tablet 3   Rivaroxaban (XARELTO) 15 MG TABS tablet Take 1 tablet (15 mg total) by mouth daily with supper. 30 tablet 1   predniSONE (DELTASONE) 10 MG tablet Take 6 tablets (60 mg total) by mouth daily with breakfast. And decrease by one tablet daily 21 tablet 0   No facility-administered medications prior to visit.    Review of Systems  Review of Systems  Constitutional: Negative.   HENT: Negative.    Respiratory:  Negative for cough and shortness of breath.        DOE in heat  Cardiovascular:  Positive for leg swelling.   Physical Exam  BP (!) 108/58 (BP Location: Left Arm, Patient Position: Sitting, Cuff Size: Normal)   Pulse (!) 51   Temp 97.8 F (36.6 C) (Oral)   Ht '5\' 10"'$  (1.778 m)   Wt 220 lb 9.6 oz (100.1 kg)   SpO2 98%   BMI 31.65 kg/m  Physical Exam Constitutional:      General: He is not in acute distress.    Appearance: Normal appearance. He is not  ill-appearing.  HENT:     Head: Normocephalic and atraumatic.  Cardiovascular:     Rate and Rhythm: Normal rate and regular rhythm.     Comments: +2 BLE edema L>R  Pulmonary:     Effort: Pulmonary effort is normal.     Breath sounds: No wheezing or rhonchi.     Comments: ? Crackles left lower base otherwise clear  Musculoskeletal:        General: Normal range of motion.     Comments: Amb with cane  Skin:    General: Skin is warm and dry.  Neurological:     General: No focal deficit present.     Mental Status: He is  alert and oriented to person, place, and time. Mental status is at baseline.  Psychiatric:        Mood and Affect: Mood normal.        Behavior: Behavior normal.        Thought Content: Thought content normal.        Judgment: Judgment normal.      Lab Results:  CBC    Component Value Date/Time   WBC 9.4 12/13/2021 0848   RBC 2.99 (L) 12/13/2021 0848   HGB 8.8 (L) 12/13/2021 0848   HGB 12.3 (L) 11/16/2020 1445   HCT 28.6 (L) 12/13/2021 0848   HCT 38.4 11/16/2020 1445   PLT 279 12/13/2021 0848   PLT 198 11/16/2020 1445   MCV 95.7 12/13/2021 0848   MCV 97 11/16/2020 1445   MCH 29.4 12/13/2021 0848   MCHC 30.8 12/13/2021 0848   RDW 14.7 12/13/2021 0848   RDW 12.8 11/16/2020 1445   LYMPHSABS 0.6 (L) 12/10/2021 1700   LYMPHSABS 1.4 11/16/2020 1445   MONOABS 0.8 12/10/2021 1700   EOSABS 0.2 12/10/2021 1700   EOSABS 0.2 11/16/2020 1445   BASOSABS 0.0 12/10/2021 1700   BASOSABS 0.0 11/16/2020 1445    BMET    Component Value Date/Time   NA 139 12/16/2021 0433   NA 146 (H) 11/16/2020 1445   K 4.2 12/16/2021 0433   CL 100 12/16/2021 0433   CO2 30 12/16/2021 0433   GLUCOSE 134 (H) 12/16/2021 0433   BUN 63 (H) 12/16/2021 0433   BUN 22 11/16/2020 1445   CREATININE 2.16 (H) 12/16/2021 0433   CREATININE 1.27 07/17/2014 1217   CALCIUM 8.3 (L) 12/16/2021 0433   GFRNONAA 30 (L) 12/16/2021 0433   GFRAA 50 (L) 08/15/2019 0713    BNP    Component Value  Date/Time   BNP 685.0 (H) 12/13/2021 1308    ProBNP    Component Value Date/Time   PROBNP 3,728 (H) 10/28/2017 1452    Imaging: DG Chest 2 View  Result Date: 12/16/2021 CLINICAL DATA:  Shortness of breath EXAM: CHEST - 2 VIEW COMPARISON:  12/12/2021 FINDINGS: No significant change in chest radiographs, with cardiomegaly status post median sternotomy and CABG, diffuse bilateral interstitial opacity, and small pleural effusions. Disc degenerative disease of the thoracic spine. IMPRESSION: No significant change in chest radiographs, with cardiomegaly status post median sternotomy and CABG, diffuse bilateral interstitial opacity, and small pleural effusions. Findings are most consistent with edema. Electronically Signed   By: Delanna Ahmadi [name]   On: 12/16/2021 13:50   DG Chest 2 View  Result Date: 12/12/2021 CLINICAL DATA:  BILATERAL leg swelling and dyspnea on exertion for 1-2 weeks EXAM: CHEST - 2 VIEW COMPARISON:  12/09/2021 FINDINGS: Upper normal size of cardiac silhouette post CABG. Mediastinal contour stable. Diffuse BILATERAL pulmonary infiltrates greater in the upper lobes favoring pneumonia. Small bibasilar effusions. No pneumothorax. IMPRESSION: Persistent BILATERAL pulmonary infiltrates with upper lobe predominance consistent with pneumonia. Electronically Signed   By: Lavonia Dana [name]   On: 12/12/2021 10:56   ECHOCARDIOGRAM COMPLETE  Result Date: 12/11/2021    ECHOCARDIOGRAM REPORT   Patient Name:   Caleb Wolf Date of Exam: 12/11/2021 Medical Rec #:  [id number]    Height:       69.0 in Accession #:    [id number]   Weight:       228.2 lb Date of Birth:  11-21-1940     BSA:          2.185 m  Patient Age:    55 years     BP:           125/58 mmHg Patient Gender: M            HR:           70 bpm. Exam Location:  [facility] Procedure: 2D Echo, Cardiac Doppler and Color Doppler Indications:    CHF  History:        Patient has prior history of Echocardiogram examinations, most                  recent 07/23/2021. CHF, Previous Myocardial Infarction and CAD,                 Abnormal ECG and Prior CABG, Arrythmias:Atrial Fibrillation,                 Signs/Symptoms:Chest Pain and Edema; Risk Factors:Hypertension,                 Dyslipidemia and Former Smoker.  Sonographer:    Wenda Low Referring Phys: [id number] ASIA B [place]  1. Septal movement consistent with postop state. Left ventricular ejection fraction, by estimation, is 45 to 50%. The left ventricle has mildly decreased function. The left ventricle demonstrates global hypokinesis. Left ventricular diastolic function could not be evaluated.  2. Right ventricular systolic function is normal. The right ventricular size is mildly enlarged. There is moderately elevated pulmonary artery systolic pressure. The estimated right ventricular systolic pressure is 00.7 mmHg.  3. Left atrial size was moderately dilated.  4. Right atrial size was mild to moderately dilated.  5. The mitral valve is grossly normal. Trivial mitral valve regurgitation. No evidence of mitral stenosis.  6. The aortic valve is tricuspid. Aortic valve regurgitation is not visualized. Aortic valve sclerosis is present, with no evidence of aortic valve stenosis.  7. The inferior vena cava is dilated in size with >50% respiratory variability, suggesting right atrial pressure of 8 mmHg. Comparison(s): No significant change from prior study. FINDINGS  Left Ventricle: Septal movement consistent with postop state. Left ventricular ejection fraction, by estimation, is 45 to 50%. The left ventricle has mildly decreased function. The left ventricle demonstrates global hypokinesis. The left ventricular internal cavity size was normal in size. There is no left ventricular hypertrophy. Abnormal (paradoxical) septal motion consistent with post-operative status. Left ventricular diastolic function could not be evaluated due to atrial fibrillation. Left ventricular diastolic function  could not be evaluated. Right Ventricle: The right ventricular size is mildly enlarged. No increase in right ventricular wall thickness. Right ventricular systolic function is normal. There is moderately elevated pulmonary artery systolic pressure. The tricuspid regurgitant velocity is 3.38 m/s, and with an assumed right atrial pressure of 8 mmHg, the estimated right ventricular systolic pressure is 62.2 mmHg. Left Atrium: Left atrial size was moderately dilated. Right Atrium: Right atrial size was mild to moderately dilated. Pericardium: There is no evidence of pericardial effusion. Mitral Valve: The mitral valve is grossly normal. Trivial mitral valve regurgitation. No evidence of mitral valve stenosis. MV peak gradient, 4.6 mmHg. The mean mitral valve gradient is 1.0 mmHg. Tricuspid Valve: The tricuspid valve is grossly normal. Tricuspid valve regurgitation is mild . No evidence of tricuspid stenosis. Aortic Valve: The aortic valve is tricuspid. Aortic valve regurgitation is not visualized. Aortic valve sclerosis is present, with no evidence of aortic valve stenosis. Aortic valve mean gradient measures 3.0 mmHg. Aortic valve peak gradient measures 5.9  mmHg. Aortic  valve area, by VTI measures 2.85 cm. Pulmonic Valve: The pulmonic valve was grossly normal. Pulmonic valve regurgitation is mild. No evidence of pulmonic stenosis. Aorta: The aortic root and ascending aorta are structurally normal, with no evidence of dilitation. Venous: The inferior vena cava is dilated in size with greater than 50% respiratory variability, suggesting right atrial pressure of 8 mmHg. IAS/Shunts: The atrial septum is grossly normal.  LEFT VENTRICLE PLAX 2D LVIDd:         5.60 cm      Diastology LVIDs:         3.60 cm      LV e' medial:    7.72 cm/s LV PW:         1.10 cm      LV E/e' medial:  11.2 LV IVS:        0.90 cm      LV e' lateral:   9.90 cm/s LVOT diam:     2.10 cm      LV E/e' lateral: 8.7 LV SV:         79 LV SV Index:   36  LVOT Area:     3.46 cm  LV Volumes (MOD) LV vol d, MOD A2C: 137.0 ml LV vol d, MOD A4C: 120.0 ml LV vol s, MOD A2C: 68.5 ml LV vol s, MOD A4C: 68.2 ml LV SV MOD A2C:     68.5 ml LV SV MOD A4C:     120.0 ml LV SV MOD BP:      60.9 ml RIGHT VENTRICLE RV Basal diam:  3.90 cm RV Mid diam:    2.80 cm RV S prime:     12.60 cm/s TAPSE (M-mode): 2.6 cm LEFT ATRIUM              Index        RIGHT ATRIUM           Index LA diam:        4.70 cm  2.15 cm/m   RA Area:     29.20 cm LA Vol (A2C):   114.0 ml 52.17 ml/m  RA Volume:   103.00 ml 47.14 ml/m LA Vol (A4C):   94.7 ml  43.34 ml/m LA Biplane Vol: 108.0 ml 49.43 ml/m  AORTIC VALVE                    PULMONIC VALVE AV Area (Vmax):    2.69 cm     PV Vmax:       0.92 m/s AV Area (Vmean):   2.69 cm     PV Peak grad:  3.4 mmHg AV Area (VTI):     2.85 cm AV Vmax:           121.00 cm/s AV Vmean:          78.700 cm/s AV VTI:            0.276 m AV Peak Grad:      5.9 mmHg AV Mean Grad:      3.0 mmHg LVOT Vmax:         94.00 cm/s LVOT Vmean:        61.100 cm/s LVOT VTI:          0.227 m LVOT/AV VTI ratio: 0.82  AORTA Ao Root diam: 3.20 cm Ao Asc diam:  3.20 cm MITRAL VALVE               TRICUSPID VALVE MV Area (PHT): 3.27 cm  TR Peak grad:   45.7 mmHg MV Area VTI:   2.26 cm    TR Vmax:        338.00 cm/s MV Peak grad:  4.6 mmHg MV Mean grad:  1.0 mmHg    SHUNTS MV Vmax:       1.07 m/s    Systemic VTI:  0.23 m MV Vmean:      52.6 cm/s   Systemic Diam: 2.10 cm MV Decel Time: 232 msec MV E velocity: 86.50 cm/s MV A velocity: 33.00 cm/s MV E/A ratio:  2.62 Eleonore Chiquito MD Electronically signed by Eleonore Chiquito MD Signature Date/Time: 12/11/2021/4:39:55 PM    Final    CT Angio Chest PE W and/or Wo Contrast  Result Date: 12/10/2021 CLINICAL DATA:  Pulmonary embolism (PE) suspected, high prob. Shortness of breath. Cough. Chest pain. EXAM: CT ANGIOGRAPHY CHEST WITH CONTRAST TECHNIQUE: Multidetector CT imaging of the chest was performed using the standard protocol during  bolus administration of intravenous contrast. Multiplanar CT image reconstructions and MIPs were obtained to evaluate the vascular anatomy. RADIATION DOSE REDUCTION: This exam was performed according to the departmental dose-optimization program which includes automated exposure control, adjustment of the mA and/or kV according to patient size and/or use of iterative reconstruction technique. CONTRAST:  32m OMNIPAQUE IOHEXOL 350 MG/ML SOLN COMPARISON:  11/27/2020 FINDINGS: Cardiovascular: Heart is mildly enlarged. No filling defects in the pulmonary arteries to suggest pulmonary emboli. Prior CABG. Aortic atherosclerosis. No aneurysm. Mediastinum/Nodes: No mediastinal, hilar, or axillary adenopathy. Trachea and esophagus are unremarkable. Thyroid unremarkable. Lungs/Pleura: Moderate bilateral pleural effusions. Compressive atelectasis in the right lower lobe. Extensive ground-glass airspace disease in both lungs, most pronounced in the upper lobes concerning for pneumonia. Upper Abdomen: No acute findings Musculoskeletal: Chest wall soft tissues are unremarkable. No acute bony abnormality. Review of the MIP images confirms the above findings. IMPRESSION: No evidence of pulmonary embolus. Moderate bilateral pleural effusions. Bilateral ground-glass airspace opacities most pronounced in the upper lobes, left greater than right. Findings concerning for multifocal pneumonia. Aortic Atherosclerosis (ICD10-I70.0). Electronically Signed   By: KRolm BaptiseM.D.   On: 12/10/2021 19:28   DG Chest 2 View  Result Date: 12/10/2021 CLINICAL DATA:  Shortness of breath EXAM: CHEST - 2 VIEW COMPARISON:  November 20, 2017 FINDINGS: The heart size and mediastinal contours are stable. Patchy consolidation is identified throughout bilateral lungs. Small bilateral pleural effusions are noted. The visualized skeletal structures are stable. IMPRESSION: Patchy consolidation identified throughout bilateral lungs, suspicious for pneumonia.  Electronically Signed   By: WAbelardo DieselM.D.   On: 12/10/2021 09:52     Assessment & Plan:   Hemoptysis - Resolved; No further episodes of hemoptysis. Felt to be d/t airway irritation from inflammatory process causing mild hemoptysis. 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 Pulmonary did not recommend bronchoscopy. ANCA, ANA and RF were negative.   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 BNP was 685 - Cont lasix '40mg'$  daily - Repeat BNP today  - Follow up with cardiology tomorrow   OSA (obstructive sleep apnea) - Patient is compliant with CPAP. Current pressure 5-20cm h20 with residual AHI 12. He is having more central apneas and obstruction which could be form heart failure. Monitor for now, may want to have titration study in lab if continues. FU in 2-3 months with Dr. YAnnamaria Boots  Persistent atrial fibrillation (HCC) - Continue Amiodarone, Toprol-XL and Eliquis - Advised patient speak with cardiology tomorrow regarding bradycardia  Acute respiratory failure with hypoxia (HCC) - Improved; SpO2 low 96% on room air today with simple walk test  - Titrate O2 to maintain oxygen level >88-90% - Checking ONO on CPAP in 2 weeks to see if we can discontinue oxygen    Martyn Ehrich, NP 12/31/2021

## 2021-12-31 NOTE — Assessment & Plan Note (Signed)
-   Continue Amiodarone, Toprol-XL and Eliquis - Advised patient speak with cardiology tomorrow regarding bradycardia

## 2021-12-31 NOTE — Assessment & Plan Note (Addendum)
-   Recommend repeating CXR today and sed rate

## 2021-12-31 NOTE — Patient Instructions (Addendum)
Recommendations - Continue to wear CPAP every night for minimum 4 to 6 hours or longer - Continue lasix '40mg'$  daily  - Please discuss Toprol-XL with cardiology tomorrow d.t bradycardia  - Titrate O2 to maintain level >88-90%, continue to wear at bedtime with CPAP until having overnight oximetry test (if normal we can discuss discontinuing oxygen)   Orders - Chest x-ray and labs today - Ambulatory walk  - ONO in 2-3 weeks   Follow-up - 2 to 3 months with Dr. Annamaria Boots or sooner for sleep apnea

## 2022-01-01 ENCOUNTER — Encounter: Payer: Self-pay | Admitting: Cardiology

## 2022-01-01 ENCOUNTER — Ambulatory Visit: Payer: Medicare HMO | Admitting: Cardiology

## 2022-01-01 VITALS — BP 144/60 | HR 54 | Ht 70.0 in | Wt 216.0 lb

## 2022-01-01 DIAGNOSIS — I5022 Chronic systolic (congestive) heart failure: Secondary | ICD-10-CM

## 2022-01-01 DIAGNOSIS — Z951 Presence of aortocoronary bypass graft: Secondary | ICD-10-CM

## 2022-01-01 DIAGNOSIS — G4733 Obstructive sleep apnea (adult) (pediatric): Secondary | ICD-10-CM | POA: Diagnosis not present

## 2022-01-01 DIAGNOSIS — I4819 Other persistent atrial fibrillation: Secondary | ICD-10-CM | POA: Diagnosis not present

## 2022-01-01 DIAGNOSIS — I2510 Atherosclerotic heart disease of native coronary artery without angina pectoris: Secondary | ICD-10-CM

## 2022-01-01 LAB — BRAIN NATRIURETIC PEPTIDE: Pro B Natriuretic peptide (BNP): 829 pg/mL — ABNORMAL HIGH (ref 0.0–100.0)

## 2022-01-01 LAB — SEDIMENTATION RATE: Sed Rate: 22 mm/hr — ABNORMAL HIGH (ref 0–20)

## 2022-01-01 NOTE — Patient Instructions (Addendum)
Medication Instructions:  The current medical regimen is effective;  continue present plan and medications.  *If you need a refill on your cardiac medications before your next appointment, please call your pharmacy*  Follow-Up: At [facility], you and your health needs are our priority.  As part of our continuing mission to provide you with exceptional heart care, we have created designated Provider Care Teams.  These Care Teams include your primary Cardiologist (physician) and Advanced Practice Providers (APPs -  Physician Assistants and Nurse Practitioners) who all work together to provide you with the care you need, when you need it.  We recommend signing up for the patient portal called "MyChart".  Sign up information is provided on this After Visit Summary.  MyChart is used to connect with patients for Virtual Visits (Telemedicine).  Patients are able to view lab/test results, encounter notes, upcoming appointments, etc.  Non-urgent messages can be sent to your provider as well.   To learn more about what you can do with MyChart, go to [email or web address].    Your next appointment:   6 month(s)  The format for your next appointment:   In Person  Provider:   You may see Candee Furbish, MD or one of the following Advanced Practice Providers on your designated Care Team:   [place], PA-C  Ermalinda Barrios, [place] {   Important Information About Sugar        Xarelto 15 mg samples lot # [id number] exp 4-24 X 2 given to pt.

## 2022-01-01 NOTE — Progress Notes (Signed)
CXR showed slight improvement in overall lung appearance. Small right > left pleural effusion also minimally improved. No focal consolidation. Needs repeat CXR in 4 weeks.

## 2022-01-01 NOTE — Progress Notes (Signed)
Cardiology Office Note:    Date:  01/01/2022   ID:  Caleb Wolf, DOB 1941-02-22, MRN [id number]  PCP:  Asencion Noble, MD   [facility] Providers Cardiologist:  Candee Furbish, MD Electrophysiologist:  Thompson Grayer, MD     Referring MD: Asencion Noble, MD    History of Present Illness:    Caleb Wolf is a 81 y.o. male here for posthospital follow-up.  He went on a cruise to [place] for his 52nd wedding anniversary.  Enjoyed.  Toward the end of the cruise was getting more and more short of breath.  Ended up developing heart failure as well as pneumonia.  Came into the hospital for further evaluation.   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  Had pneumonia/Boop.  Taking amiodarone 200 mg a day atorvastatin 80 mg a day furosemide 40 mg daily metoprolol succinate 25 mg a day.  EKG showed normal sinus rhythm.  Troponin was low at 24.  Creatinine hovered around 1.9-2.  Hemoglobin was 8.7 and BNP was 521.  He was diuresed with assistance of Reds vest.  Weight went down to 225 pounds with 6 days of diuresis. Now is 216.  His losartan 50 mg and Jardiance 10 mg was held because of GFR 30.  Minimally elevated troponin was not consistent with ACS.  He is not on aspirin because of Xarelto.  He was in sinus rhythm with ablation in 2019 and repeat in July 2022.  He remains on amiodarone.  Cardizem was switched over to Toprol because of cardiomyopathy.  His Xarelto as well was on hold because of hemoptysis.  He was placed on Xarelto 15 mg because of renal function.  Smart watch, Apple, HR fell below 40 during sleep asymptomatic.Marland Kitchen     Past Medical History:  Diagnosis Date   AAA (abdominal aortic aneurysm) (HCC)    Arthritis    Basal cell carcinoma     Carotid stenosis    Chronic kidney disease, stage 3b ([place]) 12/13/2021   Coronary artery disease    Multvessel s/p CABG 2015   Enlarged prostate    Essential hypertension    GERD (gastroesophageal reflux disease)    Gout    History of colon polyps    History of kidney stones    Hyperlipidemia    Lumbar disc disease    Persistent atrial fibrillation (HCC)    Sleep apnea    CPAP    Past Surgical History:  Procedure Laterality Date   ABLATION OF DYSRHYTHMIC FOCUS  07/28/2017   ATRIAL FIBRILLATION ABLATION N/A 07/28/2017   Procedure: ATRIAL FIBRILLATION ABLATION;  Surgeon: Thompson Grayer, MD;  Location: [place] [facility];  Service: Cardiovascular;  Laterality: N/A;   ATRIAL FIBRILLATION ABLATION N/A 12/04/2020   Procedure: ATRIAL FIBRILLATION ABLATION;  Surgeon: Thompson Grayer, MD;  Location: [place] [facility];  Service: Cardiovascular;  Laterality: N/A;   BACK SURGERY     CARDIAC CATHETERIZATION  05/22/2014   Procedure: IABP INSERTION;  Surgeon: Leonie Man, MD;  Location: [facility] CATH LAB;  Service: Cardiovascular;;   CARDIOVERSION N/A 04/29/2017   Procedure: CARDIOVERSION;  Surgeon: Satira Sark, MD;  [facility];  Service: Cardiovascular;  Laterality: N/A;   CARDIOVERSION N/A 08/13/2017   Procedure: CARDIOVERSION;  Surgeon: Debara Pickett,  Nadean Corwin, MD;  Location: [facility] ENDOSCOPY;  Service: Cardiovascular;  Laterality: N/A;   CARDIOVERSION N/A 08/15/2019   Procedure: CARDIOVERSION;  Surgeon: Dorothy Spark, MD;  Location: [facility] ENDOSCOPY;  Service: Cardiovascular;  Laterality: N/A;   CARDIOVERSION N/A 03/09/2020   Procedure: CARDIOVERSION;  Surgeon: Buford Dresser, MD;  Location: [place];  Service: Cardiovascular;  Laterality: N/A;   CARDIOVERSION N/A 07/06/2020   Procedure: CARDIOVERSION;  Surgeon: Sanda Klein, MD;  Location: [place] ENDOSCOPY;  Service: Cardiovascular;  Laterality: N/A;   CARDIOVERSION N/A 01/18/2021   Procedure: CARDIOVERSION;  Surgeon: Skeet Latch, MD;  Location: [facility] ENDOSCOPY;  Service: Cardiovascular;  Laterality: N/A;   CARDIOVERSION N/A 06/17/2021   Procedure: CARDIOVERSION;  Surgeon: Geralynn Rile, MD;  Location: [place];  Service: Cardiovascular;  Laterality: N/A;   Cataract surgery Right    COLONOSCOPY     COLONOSCOPY N/A 12/30/2017   Procedure: COLONOSCOPY;  Surgeon: Rogene Houston, MD;  [facility];  Service: Endoscopy;  Laterality: N/A;   CORONARY ARTERY BYPASS GRAFT N/A 05/22/2014   Procedure: CORONARY ARTERY BYPASS GRAFTING (CABG) times three using left internal mammary and right saphenous vein.;  Surgeon: Melrose Nakayama, MD;  Location: [place];  Service: Open Heart Surgery;  Laterality: N/A;   ENDARTERECTOMY Left 02/17/2014   Procedure: ENDARTERECTOMY CAROTID WITH PATCH ANGIOPLASTY;  Surgeon: Mal Misty, MD;  Location: [place];  Service: Vascular;  Laterality: Left;   ESOPHAGEAL DILATION N/A 08/22/2015   Procedure: ESOPHAGEAL DILATION;  Surgeon: Rogene Houston, MD;  [facility];  Service: Endoscopy;  Laterality: N/A;   ESOPHAGOGASTRODUODENOSCOPY N/A 08/22/2015   Procedure: ESOPHAGOGASTRODUODENOSCOPY (EGD);  Surgeon: Rogene Houston, MD;  [facility];  Service: Endoscopy;  Laterality: N/A;  12:45 - moved to 1:55 - Ann notified pt   ESOPHAGOGASTRODUODENOSCOPY N/A 12/30/2017   Procedure: ESOPHAGOGASTRODUODENOSCOPY (EGD);  Surgeon: Rogene Houston, MD;  [facility];  Service: Endoscopy;  Laterality: N/A;  200   EYE SURGERY     cataract extraction (right) with repair macular tear , with IOL     right   FRACTURE SURGERY     bilateral wrist fractures- one ORIF   JOINT REPLACEMENT  2011   left knee   LEFT HEART CATHETERIZATION WITH CORONARY ANGIOGRAM N/A 05/22/2014   Procedure: LEFT HEART CATHETERIZATION WITH CORONARY ANGIOGRAM;  Surgeon: Leonie Man, MD;  Location: [facility] CATH LAB;  Service: Cardiovascular;  Laterality: N/A;   LITHOTRIPSY     PARS PLANA  VITRECTOMY W/ REPAIR OF MACULAR HOLE     RHINOPLASTY     TEE WITHOUT CARDIOVERSION N/A 04/29/2017   Procedure: TRANSESOPHAGEAL ECHOCARDIOGRAM (TEE) WITH PROPOFOL;  Surgeon: Satira Sark, MD;  [facility];  Service: Cardiovascular;  Laterality: N/A;   TONSILLECTOMY     TOTAL HIP ARTHROPLASTY  12/08/2011   Procedure: TOTAL HIP ARTHROPLASTY;  Surgeon: Gearlean Alf, MD;  Location: [facility];  Service: Orthopedics;  Laterality: Right;   UPPER GI ENDOSCOPY  12/18/2015   Procedure: UPPER GI ENDOSCOPY;  Surgeon: Ralene Ok, MD;  Location: [facility];  Service: General;;   WRIST SURGERY Right 75yr ago   WRIST SURGERY Left     Current Medications: Current Meds  Medication Sig   acetaminophen (TYLENOL) 500 MG tablet Take 1,000 mg by mouth every 6 (six) hours as needed for moderate pain.   amiodarone (PACERONE) 200 MG tablet Take 1 tablet (200 mg total) by mouth daily.   amoxicillin (AMOXIL)  500 MG capsule Take 2,000 mg by mouth See admin instructions. Take 4 capsules (2000 mg) by mouth before dental procedures   atorvastatin (LIPITOR) 80 MG tablet TAKE 1 TABLET BY MOUTH EVERY EVENING   colchicine 0.6 MG tablet Take 0.6 mg by mouth daily as needed (for gout flare up).   doxazosin (CARDURA) 4 MG tablet Take 4 mg by mouth in the morning.   finasteride (PROSCAR) 5 MG tablet Take 5 mg by mouth every evening.    fluticasone (FLONASE) 50 MCG/ACT nasal spray Place 1 spray into both nostrils daily as needed for allergies or rhinitis.   furosemide (LASIX) 40 MG tablet Take 1 tablet (40 mg total) by mouth daily.   metoprolol succinate (TOPROL-XL) 25 MG 24 hr tablet Take 1 tablet (25 mg total) by mouth daily.   nitroGLYCERIN (NITROSTAT) 0.4 MG SL tablet Place 1 tablet (0.4 mg total) under the tongue every 5 (five) minutes x 3 doses as needed for chest pain.   pantoprazole (PROTONIX) 40 MG tablet Take 1 tablet (40 mg total) by mouth daily before breakfast. (Patient taking differently: Take 40 mg  by mouth every evening.)   Rivaroxaban (XARELTO) 15 MG TABS tablet Take 1 tablet (15 mg total) by mouth daily with supper.     Allergies:   Codeine sulfate   Social History   Socioeconomic History   Marital status: Married    Spouse name: Not on file   Number of children: Not on file   Years of education: Not on file   Highest education level: Not on file  Occupational History   Occupation: retired    Comment: [employer]  Tobacco Use   Smoking status: Former    Packs/day: 1.50    Years: 25.00    Total pack years: 37.50    Types: Cigarettes    Start date: 08/08/1960    Quit date: 06/02/1982    Years since quitting: 39.6   Smokeless tobacco: Never   Tobacco comments:    quit smoking 30+yrs ago  Vaping Use   Vaping Use: Never used  Substance and Sexual Activity   Alcohol use: No    Alcohol/week: 0.0 standard drinks of alcohol    Comment: occasionally    Drug use: No   Sexual activity: Yes  Other Topics Concern   Not on file  Social History Narrative   Not on file   Social Determinants of Health   Financial Resource Strain: Not on file  Food Insecurity: Not on file  Transportation Needs: Not on file  Physical Activity: Not on file  Stress: Not on file  Social Connections: Not on file     Family History: The patient's family history includes Allergies in his mother; Heart disease in his father and mother; Hypertension in his mother.  ROS:   Please see the history of present illness.     All other systems reviewed and are negative.  EKGs/Labs/Other Studies Reviewed:    The following studies were reviewed today: Hospital records reviewed lab work echo   Recent Labs: 06/25/2021: TSH 3.640 12/11/2021: ALT 24 12/13/2021: B Natriuretic Peptide 685.0; Hemoglobin 8.8; Platelets 279 12/15/2021: Magnesium 2.4 12/16/2021: BUN 63; Creatinine, Ser 2.16; Potassium 4.2; Sodium 139 12/31/2021: Pro B Natriuretic peptide (BNP) 829.0  Recent Lipid Panel     Component Value Date/Time   CHOL 134 03/22/2015 0816   TRIG 92 03/22/2015 0816   HDL 41 03/22/2015 0816   CHOLHDL 3.3 03/22/2015 0816   VLDL 18 03/22/2015  0816   LDLCALC 75 03/22/2015 0816     Risk Assessment/Calculations:              Physical Exam:    VS:  BP (!) 144/60   Pulse (!) 54   Ht '5\' 10"'$  (1.778 m)   Wt 216 lb (98 kg)   SpO2 97%   BMI 30.99 kg/m     Wt Readings from Last 3 Encounters:  01/01/22 216 lb (98 kg)  12/31/21 220 lb 9.6 oz (100.1 kg)  12/16/21 225 lb (102.1 kg)     GEN: Wearing nasal cannula oxygen well nourished, well developed in no acute distress HEENT: Normal NECK: No JVD; No carotid bruits LYMPHATICS: No lymphadenopathy CARDIAC: RRR, no murmurs, no rubs, gallops RESPIRATORY:  Clear to auscultation without rales, wheezing or rhonchi  ABDOMEN: Soft, non-tender, non-distended MUSCULOSKELETAL:  No edema; No deformity  SKIN: Warm and dry NEUROLOGIC:  Alert and oriented x 3 PSYCHIATRIC:  Normal affect   ASSESSMENT:    1. Persistent atrial fibrillation ([place])   2. S/P CABG x 3   3. Coronary artery disease involving native coronary artery of native heart without angina pectoris   4. Chronic systolic heart failure (HCC)    PLAN:    In order of problems listed above:  Chronic systolic heart failure with mildly reduced ejection fraction - Switched from Cardizem to Toprol.  Continue with 25 mg a day.  Continue with Lasix 40 mg a day.  If weight increases he knows to take double dosage.  Discussed with wife as well.  Continuing with amiodarone 200 mg a day for rhythm control.  No ARN I because of creatinine.  Coronary artery disease status post CABG 2015 - See anatomy as above.  Doing well without any anginal symptoms.  Minimally elevated troponin was acute myocardial injury in the setting of pneumonia and heart failure.  Paroxysmal atrial fibrillation - 2 ablations 2019 2022 [name]. - Continue with amiodarone 200 mg a day.  Continue  with close monitoring of LFTs TSH lung studies.  Recently saw pulmonary, improvement in spirometry.  Chronic anticoagulation - Continue with Xarelto dose adjusted 15 mg because of GFR.  Chronic kidney disease stage IIIb - Creatinine 1.9-2.1.  We have dose adjusted medications as above.  Recent pneumonia/Boop - Following cruise to [place].  Wearing supplemental oxygen currently.  Monitored by primary team.  Dr. Willey Blade has drawn blood work yesterday.        Medication Adjustments/Labs and Tests Ordered: Current medicines are reviewed at length with the patient today.  Concerns regarding medicines are outlined above.  No orders of the defined types were placed in this encounter.  No orders of the defined types were placed in this encounter.   Patient Instructions  Medication Instructions:  The current medical regimen is effective;  continue present plan and medications.  *If you need a refill on your cardiac medications before your next appointment, please call your pharmacy*  Follow-Up: At [facility], you and your health needs are our priority.  As part of our continuing mission to provide you with exceptional heart care, we have created designated Provider Care Teams.  These Care Teams include your primary Cardiologist (physician) and Advanced Practice Providers (APPs -  Physician Assistants and Nurse Practitioners) who all work together to provide you with the care you need, when you need it.  We recommend signing up for the patient portal called "MyChart".  Sign up information is provided on this After Visit Summary.  MyChart is used to connect with patients for Virtual Visits (Telemedicine).  Patients are able to view lab/test results, encounter notes, upcoming appointments, etc.  Non-urgent messages can be sent to your provider as well.   To learn more about what you can do with MyChart, go to [email or web address].    Your next appointment:   6 month(s)  The format for  your next appointment:   In Person  Provider:   You may see Candee Furbish, MD or one of the following Advanced Practice Providers on your designated Care Team:   [place], PA-C  Ermalinda Barrios, [place] {   Important Information About Sugar        Xarelto 15 mg samples lot # [id number] exp 4-24 X 2 given to pt.    Signed, Candee Furbish, MD  01/01/2022 5:01 PM    [facility]

## 2022-01-01 NOTE — Progress Notes (Signed)
Please let patient know BNP was elevated, he should discuss diuretics with cardiology today. Sed rate has improved to an almost normal range.

## 2022-01-02 ENCOUNTER — Other Ambulatory Visit: Payer: Self-pay | Admitting: *Deleted

## 2022-01-02 DIAGNOSIS — R918 Other nonspecific abnormal finding of lung field: Secondary | ICD-10-CM

## 2022-01-02 NOTE — Progress Notes (Signed)
Reviewed and agree with assessment/plan.   Chesley Mires, MD [facility] Pulmonary/Critical Care 01/02/2022, 9:56 AM Pager:  [phone]

## 2022-01-10 DIAGNOSIS — I10 Essential (primary) hypertension: Secondary | ICD-10-CM | POA: Diagnosis not present

## 2022-01-10 DIAGNOSIS — N1832 Chronic kidney disease, stage 3b: Secondary | ICD-10-CM | POA: Diagnosis not present

## 2022-01-10 DIAGNOSIS — M109 Gout, unspecified: Secondary | ICD-10-CM | POA: Diagnosis not present

## 2022-01-10 DIAGNOSIS — I700 Atherosclerosis of aorta: Secondary | ICD-10-CM | POA: Diagnosis not present

## 2022-01-10 DIAGNOSIS — I2510 Atherosclerotic heart disease of native coronary artery without angina pectoris: Secondary | ICD-10-CM | POA: Diagnosis not present

## 2022-01-10 DIAGNOSIS — I5022 Chronic systolic (congestive) heart failure: Secondary | ICD-10-CM | POA: Diagnosis not present

## 2022-01-10 DIAGNOSIS — M1990 Unspecified osteoarthritis, unspecified site: Secondary | ICD-10-CM | POA: Diagnosis not present

## 2022-01-10 DIAGNOSIS — Z79899 Other long term (current) drug therapy: Secondary | ICD-10-CM | POA: Diagnosis not present

## 2022-01-10 DIAGNOSIS — I480 Paroxysmal atrial fibrillation: Secondary | ICD-10-CM | POA: Diagnosis not present

## 2022-01-10 DIAGNOSIS — E785 Hyperlipidemia, unspecified: Secondary | ICD-10-CM | POA: Diagnosis not present

## 2022-01-13 ENCOUNTER — Other Ambulatory Visit: Payer: Self-pay | Admitting: Internal Medicine

## 2022-01-13 ENCOUNTER — Other Ambulatory Visit: Payer: Self-pay

## 2022-01-13 MED ORDER — METOPROLOL SUCCINATE ER 25 MG PO TB24: 25.0000 mg | ORAL_TABLET | Freq: Every day | ORAL | 3 refills | Status: DC

## 2022-01-13 NOTE — Telephone Encounter (Signed)
Per OV 01/01/22 with Dr. Marlou Porch: Chronic anticoagulation - Continue with Xarelto dose adjusted 15 mg because of GFR.  Replace Diltiazem with Toprol '25mg'$ . Refill for Toprol has been sent to pharmacy.   Patient should be on Xarelto '15mg'$ . Routing to [facility] for management of Xarelto.

## 2022-01-16 DIAGNOSIS — J9621 Acute and chronic respiratory failure with hypoxia: Secondary | ICD-10-CM | POA: Diagnosis not present

## 2022-01-16 DIAGNOSIS — J9601 Acute respiratory failure with hypoxia: Secondary | ICD-10-CM | POA: Diagnosis not present

## 2022-01-16 DIAGNOSIS — I4891 Unspecified atrial fibrillation: Secondary | ICD-10-CM | POA: Diagnosis not present

## 2022-01-17 DIAGNOSIS — I10 Essential (primary) hypertension: Secondary | ICD-10-CM | POA: Diagnosis not present

## 2022-01-17 DIAGNOSIS — N1831 Chronic kidney disease, stage 3a: Secondary | ICD-10-CM | POA: Diagnosis not present

## 2022-01-17 DIAGNOSIS — I5022 Chronic systolic (congestive) heart failure: Secondary | ICD-10-CM | POA: Diagnosis not present

## 2022-01-29 ENCOUNTER — Ambulatory Visit (INDEPENDENT_AMBULATORY_CARE_PROVIDER_SITE_OTHER): Payer: Medicare HMO

## 2022-01-29 DIAGNOSIS — J9811 Atelectasis: Secondary | ICD-10-CM | POA: Diagnosis not present

## 2022-01-29 DIAGNOSIS — R918 Other nonspecific abnormal finding of lung field: Secondary | ICD-10-CM | POA: Diagnosis not present

## 2022-01-29 DIAGNOSIS — J90 Pleural effusion, not elsewhere classified: Secondary | ICD-10-CM | POA: Diagnosis not present

## 2022-01-30 NOTE — Progress Notes (Signed)
Please let patient know CXR showed small stable right pleural effusion. If patient is not having significant shortness of breath,  cough or fever no intervention needed

## 2022-02-01 DIAGNOSIS — G4733 Obstructive sleep apnea (adult) (pediatric): Secondary | ICD-10-CM | POA: Diagnosis not present

## 2022-02-13 ENCOUNTER — Encounter (INDEPENDENT_AMBULATORY_CARE_PROVIDER_SITE_OTHER): Payer: Self-pay | Admitting: Ophthalmology

## 2022-02-13 ENCOUNTER — Ambulatory Visit (INDEPENDENT_AMBULATORY_CARE_PROVIDER_SITE_OTHER): Payer: Medicare HMO | Admitting: Ophthalmology

## 2022-02-13 ENCOUNTER — Encounter (INDEPENDENT_AMBULATORY_CARE_PROVIDER_SITE_OTHER): Payer: Medicare HMO | Admitting: Ophthalmology

## 2022-02-13 DIAGNOSIS — H43812 Vitreous degeneration, left eye: Secondary | ICD-10-CM | POA: Diagnosis not present

## 2022-02-13 DIAGNOSIS — Z9889 Other specified postprocedural states: Secondary | ICD-10-CM | POA: Diagnosis not present

## 2022-02-13 DIAGNOSIS — H35372 Puckering of macula, left eye: Secondary | ICD-10-CM | POA: Diagnosis not present

## 2022-02-13 NOTE — Assessment & Plan Note (Signed)
No holes or tears 

## 2022-02-13 NOTE — Progress Notes (Signed)
02/13/2022     CHIEF COMPLAINT Patient presents for  Chief Complaint  Patient presents with   Retina Evaluation      HISTORY OF PRESENT ILLNESS: Caleb Wolf is a 81 y.o. male who presents to the clinic today for:   HPI   Left epiretinal membrane 2 year fu ou oct Pt states his vision has been stable Pt admits to floaters Pt denies any FOL Pt states he has some blurry vision  Last edited by Morene Rankins, CMA on 02/13/2022  3:24 PM.      Referring physician: Asencion Noble, MD [address] [place],  [place] [address]  HISTORICAL INFORMATION:   Selected notes from the [facility]    Lab Results  Component Value Date   HGBA1C 5.8 (H) 05/23/2014     CURRENT MEDICATIONS: No current outpatient medications on file. (Ophthalmic Drugs)   No current facility-administered medications for this visit. (Ophthalmic Drugs)   Current Outpatient Medications (Other)  Medication Sig   acetaminophen (TYLENOL) 500 MG tablet Take 1,000 mg by mouth every 6 (six) hours as needed for moderate pain.   amiodarone (PACERONE) 200 MG tablet Take 1 tablet (200 mg total) by mouth daily.   amoxicillin (AMOXIL) 500 MG capsule Take 2,000 mg by mouth See admin instructions. Take 4 capsules (2000 mg) by mouth before dental procedures   atorvastatin (LIPITOR) 80 MG tablet TAKE 1 TABLET BY MOUTH EVERY EVENING   colchicine 0.6 MG tablet Take 0.6 mg by mouth daily as needed (for gout flare up).   doxazosin (CARDURA) 4 MG tablet Take 4 mg by mouth in the morning.   finasteride (PROSCAR) 5 MG tablet Take 5 mg by mouth every evening.    fluticasone (FLONASE) 50 MCG/ACT nasal spray Place 1 spray into both nostrils daily as needed for allergies or rhinitis.   furosemide (LASIX) 40 MG tablet Take 1 tablet (40 mg total) by mouth daily.   lisinopril (ZESTRIL) 40 MG tablet Take 40 mg by mouth daily.   metoprolol succinate (TOPROL-XL) 25 MG 24 hr tablet Take 1 tablet (25 mg total) by mouth  daily.   nitroGLYCERIN (NITROSTAT) 0.4 MG SL tablet Place 1 tablet (0.4 mg total) under the tongue every 5 (five) minutes x 3 doses as needed for chest pain.   pantoprazole (PROTONIX) 40 MG tablet Take 1 tablet (40 mg total) by mouth daily before breakfast. (Patient taking differently: Take 40 mg by mouth every evening.)   Rivaroxaban (XARELTO) 15 MG TABS tablet Take 1 tablet (15 mg total) by mouth daily with supper.   No current facility-administered medications for this visit. (Other)      REVIEW OF SYSTEMS: ROS   Negative for: Constitutional, Gastrointestinal, Neurological, Skin, Genitourinary, Musculoskeletal, HENT, Endocrine, Cardiovascular, Eyes, Respiratory, Psychiatric, Allergic/Imm, Heme/Lymph Last edited by Orene Desanctis D, CMA on 02/13/2022  3:24 PM.       ALLERGIES Allergies  Allergen Reactions   Codeine Sulfate Nausea Only    PAST MEDICAL HISTORY Past Medical History:  Diagnosis Date   AAA (abdominal aortic aneurysm) (HCC)    Arthritis    Basal cell carcinoma    Carotid stenosis    Chronic kidney disease, stage 3b ([place] [place]) 12/13/2021   Coronary artery disease    Multvessel s/p CABG 2015   Enlarged prostate    Essential hypertension    GERD (gastroesophageal reflux disease)    Gout    History of colon polyps    History of kidney stones  Hyperlipidemia    Lumbar disc disease    Persistent atrial fibrillation (HCC)    Sleep apnea    CPAP   Past Surgical History:  Procedure Laterality Date   ABLATION OF DYSRHYTHMIC FOCUS  07/28/2017   ATRIAL FIBRILLATION ABLATION N/A 07/28/2017   Procedure: ATRIAL FIBRILLATION ABLATION;  Surgeon: Thompson Grayer, MD;  Location: [facility];  Service: Cardiovascular;  Laterality: N/A;   ATRIAL FIBRILLATION ABLATION N/A 12/04/2020   Procedure: ATRIAL FIBRILLATION ABLATION;  Surgeon: Thompson Grayer, MD;  Location: [facility] [place] [facility];  Service: Cardiovascular;  Laterality: N/A;   BACK SURGERY     CARDIAC CATHETERIZATION   05/22/2014   Procedure: IABP INSERTION;  Surgeon: Leonie Man, MD;  Location: [facility] CATH LAB;  Service: Cardiovascular;;   CARDIOVERSION N/A 04/29/2017   Procedure: CARDIOVERSION;  Surgeon: Satira Sark, MD;  [facility];  Service: Cardiovascular;  Laterality: N/A;   CARDIOVERSION N/A 08/13/2017   Procedure: CARDIOVERSION;  Surgeon: Pixie Casino, MD;  Location: [facility];  Service: Cardiovascular;  Laterality: N/A;   CARDIOVERSION N/A 08/15/2019   Procedure: CARDIOVERSION;  Surgeon: Dorothy Spark, MD;  Location: [facility] ENDOSCOPY;  Service: Cardiovascular;  Laterality: N/A;   CARDIOVERSION N/A 03/09/2020   Procedure: CARDIOVERSION;  Surgeon: Buford Dresser, MD;  Location: [facility] [place];  Service: Cardiovascular;  Laterality: N/A;   CARDIOVERSION N/A 07/06/2020   Procedure: CARDIOVERSION;  Surgeon: Sanda Klein, MD;  Location: [place];  Service: Cardiovascular;  Laterality: N/A;   CARDIOVERSION N/A 01/18/2021   Procedure: CARDIOVERSION;  Surgeon: Skeet Latch, MD;  Location: [facility] ENDOSCOPY;  Service: Cardiovascular;  Laterality: N/A;   CARDIOVERSION N/A 06/17/2021   Procedure: CARDIOVERSION;  Surgeon: Geralynn Rile, MD;  Location: [place];  Service: Cardiovascular;  Laterality: N/A;   Cataract surgery Right    COLONOSCOPY     COLONOSCOPY N/A 12/30/2017   Procedure: COLONOSCOPY;  Surgeon: Rogene Houston, MD;  [facility];  Service: Endoscopy;  Laterality: N/A;   CORONARY ARTERY BYPASS GRAFT N/A 05/22/2014   Procedure: CORONARY ARTERY BYPASS GRAFTING (CABG) times three using left internal mammary and right saphenous vein.;  Surgeon: Melrose Nakayama, MD;  Location: [facility];  Service: Open Heart Surgery;  Laterality: N/A;   ENDARTERECTOMY Left 02/17/2014   Procedure: ENDARTERECTOMY CAROTID WITH PATCH ANGIOPLASTY;  Surgeon: Mal Misty, MD;  Location: [place];  Service: Vascular;  Laterality: Left;   ESOPHAGEAL DILATION N/A 08/22/2015    Procedure: ESOPHAGEAL DILATION;  Surgeon: Rogene Houston, MD;  [facility];  Service: Endoscopy;  Laterality: N/A;   ESOPHAGOGASTRODUODENOSCOPY N/A 08/22/2015   Procedure: ESOPHAGOGASTRODUODENOSCOPY (EGD);  Surgeon: Rogene Houston, MD;  [facility];  Service: Endoscopy;  Laterality: N/A;  12:45 - moved to 1:55 - Ann notified pt   ESOPHAGOGASTRODUODENOSCOPY N/A 12/30/2017   Procedure: ESOPHAGOGASTRODUODENOSCOPY (EGD);  Surgeon: Rogene Houston, MD;  [facility];  Service: Endoscopy;  Laterality: N/A;  200   EYE SURGERY     cataract extraction (right) with repair macular tear , with IOL     right   FRACTURE SURGERY     bilateral wrist fractures- one ORIF   JOINT REPLACEMENT  2011   left knee   LEFT HEART CATHETERIZATION WITH CORONARY ANGIOGRAM N/A 05/22/2014   Procedure: LEFT HEART CATHETERIZATION WITH CORONARY ANGIOGRAM;  Surgeon: Leonie Man, MD;  Location: [facility] CATH LAB;  Service: Cardiovascular;  Laterality: N/A;   LITHOTRIPSY     PARS  PLANA VITRECTOMY W/ REPAIR OF MACULAR HOLE     RHINOPLASTY     TEE WITHOUT CARDIOVERSION N/A 04/29/2017   Procedure: TRANSESOPHAGEAL ECHOCARDIOGRAM (TEE) WITH PROPOFOL;  Surgeon: Satira Sark, MD;  [facility];  Service: Cardiovascular;  Laterality: N/A;   TONSILLECTOMY     TOTAL HIP ARTHROPLASTY  12/08/2011   Procedure: TOTAL HIP ARTHROPLASTY;  Surgeon: Gearlean Alf, MD;  Location: [facility];  Service: Orthopedics;  Laterality: Right;   UPPER GI ENDOSCOPY  12/18/2015   Procedure: UPPER GI ENDOSCOPY;  Surgeon: Ralene Ok, MD;  Location: [facility];  Service: General;;   WRIST SURGERY Right 23yr ago   WRIST SURGERY Left     FAMILY HISTORY Family History  Problem Relation Age of Onset   Allergies Mother    Heart disease Mother    Hypertension Mother    Heart disease Father        MI    SOCIAL HISTORY Social History   Tobacco Use   Smoking status: Former    Packs/day: 1.50    Years:  25.00    Total pack years: 37.50    Types: Cigarettes    Start date: 08/08/1960    Quit date: 06/02/1982    Years since quitting: 39.7   Smokeless tobacco: Never   Tobacco comments:    quit smoking 30+yrs ago  Vaping Use   Vaping Use: Never used  Substance Use Topics   Alcohol use: No    Alcohol/week: 0.0 standard drinks of alcohol    Comment: occasionally    Drug use: No         OPHTHALMIC EXAM:  Base Eye Exam     Visual Acuity (ETDRS)       Right Left   Dist cc 20/40 +1 20/20 -1    Correction: Glasses         Tonometry (Tonopen, 3:28 PM)       Right Left   Pressure 7 8         Pupils       Pupils   Right PERRL   Left PERRL         Visual Fields       Left Right    Full Full         Extraocular Movement       Right Left    Ortho Ortho    -- -- --  --  --  -- -- --   -- -- --  --  --  -- -- --           Neuro/Psych     Oriented x3: Yes   Mood/Affect: Normal         Dilation     Both eyes: 1.0% Mydriacyl, 2.5% Phenylephrine @ 3:25 PM           Slit Lamp and Fundus Exam     External Exam       Right Left   External Normal Normal         Slit Lamp Exam       Right Left   Lids/Lashes Normal Normal   Conjunctiva/Sclera White and quiet White and quiet   Cornea Clear Clear   Anterior Chamber Deep and quiet Deep and quiet   Iris Round and reactive Round and reactive   Lens Posterior chamber intraocular lens, Centered posterior chamber intraocular lens Posterior chamber intraocular lens   Anterior Vitreous Normal Normal  Fundus Exam       Right Left   Posterior Vitreous Clear, vitrectomized Posterior vitreous detachment   Disc Normal Normal   C/D Ratio 0.3 0.3   Macula No topographic distortion Clinical epiretinal membrane temporal to the fovea, no foveal distortion   Vessels Normal Normal   Periphery Normal Normal            IMAGING AND PROCEDURES  Imaging and Procedures for  02/13/22  OCT, Retina - OU - Both Eyes       Right Eye Quality was good. Scan locations included subfoveal. Central Foveal Thickness: 397. Progression has been stable. Findings include abnormal foveal contour.   Left Eye Quality was good. Scan locations included subfoveal. Central Foveal Thickness: 267. Findings include abnormal foveal contour.   Notes OD post membrane peel years ago, stable and chronic retinal thickening likely some white due to residual sickening from epiretinal membrane.  But also the patient does have sleep apnea.  I do not see complete resolution of the retinal thickening in folks even with untreated sleep apnea.  OS with minor inner temporal aspect of the fovea from epiretinal membrane, not clinically significant             ASSESSMENT/PLAN:  Posterior vitreous detachment of left eye No holes or tears  History of vitrectomy OD, underwent vitrectomy membrane peel approximately 2001, Dr. Zadie Rhine     ICD-10-CM   1. Left epiretinal membrane  H35.372 OCT, Retina - OU - Both Eyes    2. Posterior vitreous detachment of left eye  H43.812     3. History of vitrectomy  Z98.890       1.  OS doing well no progression of ERM continue to observe  2.  Right with CPAP use important for macular perfusion  3.  Ophthalmic Meds Ordered this visit:  No orders of the defined types were placed in this encounter.      Return in about 2 years (around 02/14/2024) for DILATE OU, COLOR FP, OCT.  There are no Patient Instructions on file for this visit.   Explained the diagnoses, plan, and follow up with the patient and they expressed understanding.  Patient expressed understanding of the importance of proper follow up care.   Clent Demark Tracker Mance [name] Diseases & Surgery of the Retina and Vitreous [facility]ina [facility] Diabetic [place] 02/13/22     Abbreviations: M myopia (nearsighted); A astigmatism; H hyperopia (farsighted); P presbyopia; Mrx spectacle prescription;  CTL  contact lenses; OD right eye; OS left eye; OU both eyes  XT exotropia; ET esotropia; PEK punctate epithelial keratitis; PEE punctate epithelial erosions; DES dry eye syndrome; MGD meibomian gland dysfunction; ATs artificial tears; PFAT's preservative free artificial tears; Oval nuclear sclerotic cataract; PSC posterior subcapsular cataract; ERM epi-retinal membrane; PVD posterior vitreous detachment; RD retinal detachment; DM diabetes mellitus; DR diabetic retinopathy; NPDR non-proliferative diabetic retinopathy; PDR proliferative diabetic retinopathy; CSME clinically significant macular edema; DME diabetic macular edema; dbh dot blot hemorrhages; CWS cotton wool spot; POAG primary open angle glaucoma; C/D cup-to-disc ratio; HVF humphrey visual field; GVF goldmann visual field; OCT optical coherence tomography; IOP intraocular pressure; BRVO Branch retinal vein occlusion; CRVO central retinal vein occlusion; CRAO central retinal artery occlusion; BRAO branch retinal artery occlusion; RT retinal tear; SB scleral buckle; PPV pars plana vitrectomy; VH Vitreous hemorrhage; PRP panretinal laser photocoagulation; IVK intravitreal kenalog; VMT vitreomacular traction; MH Macular hole;  NVD neovascularization of the disc; NVE neovascularization elsewhere; AREDS age related eye disease study; ARMD  age related macular degeneration; POAG primary open angle glaucoma; EBMD epithelial/anterior basement membrane dystrophy; ACIOL anterior chamber intraocular lens; IOL intraocular lens; PCIOL posterior chamber intraocular lens; Phaco/IOL phacoemulsification with intraocular lens placement; [place] photorefractive keratectomy; LASIK laser assisted in situ keratomileusis; HTN hypertension; DM diabetes mellitus; COPD chronic obstructive pulmonary disease

## 2022-02-13 NOTE — Assessment & Plan Note (Signed)
OD, underwent vitrectomy membrane peel approximately 2001, Dr. Zadie Rhine

## 2022-02-14 ENCOUNTER — Ambulatory Visit: Payer: Medicare HMO | Admitting: Student

## 2022-02-16 DIAGNOSIS — J9601 Acute respiratory failure with hypoxia: Secondary | ICD-10-CM | POA: Diagnosis not present

## 2022-02-16 DIAGNOSIS — J9621 Acute and chronic respiratory failure with hypoxia: Secondary | ICD-10-CM | POA: Diagnosis not present

## 2022-02-16 DIAGNOSIS — I4891 Unspecified atrial fibrillation: Secondary | ICD-10-CM | POA: Diagnosis not present

## 2022-02-26 DIAGNOSIS — D239 Other benign neoplasm of skin, unspecified: Secondary | ICD-10-CM | POA: Diagnosis not present

## 2022-02-26 DIAGNOSIS — L570 Actinic keratosis: Secondary | ICD-10-CM | POA: Diagnosis not present

## 2022-02-26 DIAGNOSIS — Z85828 Personal history of other malignant neoplasm of skin: Secondary | ICD-10-CM | POA: Diagnosis not present

## 2022-02-27 ENCOUNTER — Ambulatory Visit: Payer: Medicare HMO | Attending: Student | Admitting: Student

## 2022-02-27 ENCOUNTER — Ambulatory Visit: Payer: Medicare HMO | Admitting: Student

## 2022-02-27 ENCOUNTER — Encounter: Payer: Self-pay | Admitting: Student

## 2022-02-27 VITALS — BP 142/64 | HR 54 | Ht 71.0 in | Wt 227.0 lb

## 2022-02-27 DIAGNOSIS — I4819 Other persistent atrial fibrillation: Secondary | ICD-10-CM

## 2022-02-27 DIAGNOSIS — I2510 Atherosclerotic heart disease of native coronary artery without angina pectoris: Secondary | ICD-10-CM | POA: Diagnosis not present

## 2022-02-27 DIAGNOSIS — I10 Essential (primary) hypertension: Secondary | ICD-10-CM

## 2022-02-27 DIAGNOSIS — I5020 Unspecified systolic (congestive) heart failure: Secondary | ICD-10-CM | POA: Diagnosis not present

## 2022-02-27 DIAGNOSIS — N1832 Chronic kidney disease, stage 3b: Secondary | ICD-10-CM

## 2022-02-27 MED ORDER — FUROSEMIDE 40 MG PO TABS: 40.0000 mg | ORAL_TABLET | Freq: Two times a day (BID) | ORAL | 3 refills | Status: DC

## 2022-02-27 NOTE — Progress Notes (Signed)
Cardiology Office Note    Date:  02/27/2022   ID:  Caleb Wolf December 04, 1940, MRN [id number]  PCP:  Asencion Noble, MD  Cardiologist: Candee Furbish, MD    Chief Complaint  Patient presents with   Follow-up    Worsening dyspnea, edema and weight gain    History of Present Illness:    Caleb Wolf is a 82 y.o. male with past medical history of CAD (s/p CABG in 2015), HFmrEF (EF 40-45% in 2018, 55-60% in 01/2018 and 50-55% in 12/2019 and 07/2021, 45-50% in 11/2021), persistent atrial fibrillation (s/p ablation in 2019 with repeat in 11/2020, recurrent atrial fibrillation afterwards and underwent DCCV in 06/2021), HTN, HLD, Stage 3 CKD and OSA who presents to the office today for evaluation of worsening dyspnea on exertion, lower extremity edema and weight gain.   He was examined by Dr. Marlou Porch in 12/2021 following a recent admission for an acute CHF exacerbation which had occurred after being on a cruise. His weight declined to 225 lbs at the time of discharge and was further decreased to 216 lbs at the time of his office visit.  He was continued on Toprol-XL 25 mg daily along with Lasix 40 mg daily. He was maintaining normal sinus rhythm and was continued on Amiodarone 200 mg daily and Xarelto for anticoagulation.  His visit for today had initially been canceled following his visit with Dr. Marlou Porch last month but he was added back to the schedule today due to his acute issues.   In talking with the patient and his wife today, he reports having worsening dyspnea and lower extremity edema for the past several weeks. By review of his weights, this has increased by approximately 10 pounds on the office scale since his last visit and he reports a similar weight gain at home. He denies any specific chest pain or palpitations.  Says his heart rate is typically in the 50's to 60's when checked at home. He uses supplemental oxygen at night with his CPAP but does not have to use this during the day and  reports saturations are overall appropriate but dip into the high 80's on occasion. No specific orthopnea or PND. He does try to monitor his fluid intake and they try to limit sodium as well.  Past Medical History:  Diagnosis Date   AAA (abdominal aortic aneurysm) (HCC)    Arthritis    Basal cell carcinoma    Carotid stenosis    Chronic kidney disease, stage 3b ([place]) 12/13/2021   Coronary artery disease    Multvessel s/p CABG 2015   Enlarged prostate    Essential hypertension    GERD (gastroesophageal reflux disease)    Gout    History of colon polyps    History of kidney stones    Hyperlipidemia    Lumbar disc disease    Persistent atrial fibrillation (HCC)    Sleep apnea    CPAP    Past Surgical History:  Procedure Laterality Date   ABLATION OF DYSRHYTHMIC FOCUS  07/28/2017   ATRIAL FIBRILLATION ABLATION N/A 07/28/2017   Procedure: ATRIAL FIBRILLATION ABLATION;  Surgeon: Thompson Grayer, MD;  Location: [place] [facility];  Service: Cardiovascular;  Laterality: N/A;   ATRIAL FIBRILLATION ABLATION N/A 12/04/2020   Procedure: ATRIAL FIBRILLATION ABLATION;  Surgeon: Thompson Grayer, MD;  Location: [place] [facility];  Service: Cardiovascular;  Laterality: N/A;   BACK SURGERY     CARDIAC CATHETERIZATION  05/22/2014   Procedure: IABP INSERTION;  Surgeon: Leonie Man, MD;  Location: [facility] C[facility];  Service: Cardiovascular;;   CARDIOVERSION N/A 04/29/2017   Procedure: CARDIOVERSION;  Surgeon: Satira Sark, MD;  [facility];  Service: Cardiovascular;  Laterality: N/A;   CARDIOVERSION N/A 08/13/2017   Procedure: CARDIOVERSION;  Surgeon: Pixie Casino, MD;  Location: [place];  Service: Cardiovascular;  Laterality: N/A;   CARDIOVERSION N/A 08/15/2019   Procedure: CARDIOVERSION;  Surgeon: Dorothy Spark, MD;  Location: [facility] ENDOSCOPY;  Service: Cardiovascular;  Laterality: N/A;   CARDIOVERSION N/A 03/09/2020   Procedure: CARDIOVERSION;  Surgeon: Buford Dresser, MD;  Location: [place];  Service: Cardiovascular;  Laterality: N/A;   CARDIOVERSION N/A 07/06/2020   Procedure: CARDIOVERSION;  Surgeon: Sanda Klein, MD;  Location: [place];  Service: Cardiovascular;  Laterality: N/A;   CARDIOVERSION N/A 01/18/2021   Procedure: CARDIOVERSION;  Surgeon: Skeet Latch, MD;  Location: [facility] ENDOSCOPY;  Service: Cardiovascular;  Laterality: N/A;   CARDIOVERSION N/A 06/17/2021   Procedure: CARDIOVERSION;  Surgeon: Geralynn Rile, MD;  Location: [place];  Service: Cardiovascular;  Laterality: N/A;   Cataract surgery Right    COLONOSCOPY     COLONOSCOPY N/A 12/30/2017   Procedure: COLONOSCOPY;  Surgeon: Rogene Houston, MD;  [facility];  Service: Endoscopy;  Laterality: N/A;   CORONARY ARTERY BYPASS GRAFT N/A 05/22/2014   Procedure: CORONARY ARTERY BYPASS GRAFTING (CABG) times three using left internal mammary and right saphenous vein.;  Surgeon: Melrose Nakayama, MD;  Location: [place];  Service: Open Heart Surgery;  Laterality: N/A;   ENDARTERECTOMY Left 02/17/2014   Procedure: ENDARTERECTOMY CAROTID WITH PATCH ANGIOPLASTY;  Surgeon: Mal Misty, MD;  Location: [place];  Service: Vascular;  Laterality: Left;   ESOPHAGEAL DILATION N/A 08/22/2015   Procedure: ESOPHAGEAL DILATION;  Surgeon: Rogene Houston, MD;  [facility];  Service: Endoscopy;  Laterality: N/A;   ESOPHAGOGASTRODUODENOSCOPY N/A 08/22/2015   Procedure: ESOPHAGOGASTRODUODENOSCOPY (EGD);  Surgeon: Rogene Houston, MD;  [facility];  Service: Endoscopy;  Laterality: N/A;  12:45 - moved to 1:55 - Ann notified pt   ESOPHAGOGASTRODUODENOSCOPY N/A 12/30/2017   Procedure: ESOPHAGOGASTRODUODENOSCOPY (EGD);  Surgeon: Rogene Houston, MD;  [facility];  Service: Endoscopy;  Laterality: N/A;  200   EYE SURGERY     cataract extraction (right) with repair macular tear , with IOL     right   FRACTURE SURGERY     bilateral wrist  fractures- one ORIF   JOINT REPLACEMENT  2011   left knee   LEFT HEART CATHETERIZATION WITH CORONARY ANGIOGRAM N/A 05/22/2014   Procedure: LEFT HEART CATHETERIZATION WITH CORONARY ANGIOGRAM;  Surgeon: Leonie Man, MD;  Location: [facility] CATH LAB;  Service: Cardiovascular;  Laterality: N/A;   LITHOTRIPSY     PARS PLANA VITRECTOMY W/ REPAIR OF MACULAR HOLE     RHINOPLASTY     TEE WITHOUT CARDIOVERSION N/A 04/29/2017   Procedure: TRANSESOPHAGEAL ECHOCARDIOGRAM (TEE) WITH PROPOFOL;  Surgeon: Satira Sark, MD;  [facility];  Service: Cardiovascular;  Laterality: N/A;   TONSILLECTOMY     TOTAL HIP ARTHROPLASTY  12/08/2011   Procedure: TOTAL HIP ARTHROPLASTY;  Surgeon: Gearlean Alf, MD;  Location: [facility];  Service: Orthopedics;  Laterality: Right;   UPPER GI ENDOSCOPY  12/18/2015   Procedure: UPPER GI ENDOSCOPY;  Surgeon: Ralene Ok, MD;  Location: [facility];  Service: General;;   WRIST SURGERY Right 12yr ago   WRIST SURGERY Left  Current Medications: Outpatient Medications Prior to Visit  Medication Sig Dispense Refill   acetaminophen (TYLENOL) 500 MG tablet Take 1,000 mg by mouth every 6 (six) hours as needed for moderate pain.     amiodarone (PACERONE) 200 MG tablet Take 1 tablet (200 mg total) by mouth daily. 90 tablet 1   amoxicillin (AMOXIL) 500 MG capsule Take 2,000 mg by mouth See admin instructions. Take 4 capsules (2000 mg) by mouth before dental procedures     atorvastatin (LIPITOR) 80 MG tablet TAKE 1 TABLET BY MOUTH EVERY EVENING 90 tablet 3   colchicine 0.6 MG tablet Take 0.6 mg by mouth daily as needed (for gout flare up).     doxazosin (CARDURA) 4 MG tablet Take 4 mg by mouth in the morning.  4   finasteride (PROSCAR) 5 MG tablet Take 5 mg by mouth every evening.      fluticasone (FLONASE) 50 MCG/ACT nasal spray Place 1 spray into both nostrils daily as needed for allergies or rhinitis.     lisinopril (ZESTRIL) 40 MG tablet Take 40 mg by mouth daily.      metoprolol succinate (TOPROL-XL) 25 MG 24 hr tablet Take 1 tablet (25 mg total) by mouth daily. 90 tablet 3   nitroGLYCERIN (NITROSTAT) 0.4 MG SL tablet Place 1 tablet (0.4 mg total) under the tongue every 5 (five) minutes x 3 doses as needed for chest pain. 75 tablet 3   pantoprazole (PROTONIX) 40 MG tablet Take 1 tablet (40 mg total) by mouth daily before breakfast. (Patient taking differently: Take 40 mg by mouth every evening.) 90 tablet 3   Rivaroxaban (XARELTO) 15 MG TABS tablet Take 1 tablet (15 mg total) by mouth daily with supper. 30 tablet 1   furosemide (LASIX) 40 MG tablet Take 1 tablet (40 mg total) by mouth daily. 30 tablet 1   No facility-administered medications prior to visit.     Allergies:   Codeine sulfate   Social History   Socioeconomic History   Marital status: Married    Spouse name: Not on file   Number of children: Not on file   Years of education: Not on file   Highest education level: Not on file  Occupational History   Occupation: retired    Comment: [employer]  Tobacco Use   Smoking status: Former    Packs/day: 1.50    Years: 25.00    Total pack years: 37.50    Types: Cigarettes    Start date: 08/08/1960    Quit date: 06/02/1982    Years since quitting: 39.7   Smokeless tobacco: Never   Tobacco comments:    quit smoking 30+yrs ago  Vaping Use   Vaping Use: Never used  Substance and Sexual Activity   Alcohol use: No    Alcohol/week: 0.0 standard drinks of alcohol    Comment: occasionally    Drug use: No   Sexual activity: Yes  Other Topics Concern   Not on file  Social History Narrative   Not on file   Social Determinants of Health   Financial Resource Strain: Not on file  Food Insecurity: Not on file  Transportation Needs: Not on file  Physical Activity: Not on file  Stress: Not on file  Social Connections: Not on file     Family History:  The patient's family history includes Allergies in his mother; Heart disease in  his father and mother; Hypertension in his mother.   Review of Systems:    Please  see the history of present illness.     All other systems reviewed and are otherwise negative except as noted above.   Physical Exam:    VS:  BP (!) 142/64   Pulse (!) 54   Ht '5\' 11"'$  (1.803 m)   Wt 227 lb (103 kg)   SpO2 92%   BMI 31.66 kg/m    General: Pleasant male appearing in no acute distress. Head: Normocephalic, atraumatic. Neck: No carotid bruits. JVD not elevated.  Lungs: Respirations regular and unlabored, decreased breath sounds along right base.  Heart: Regular rate and rhythm. No S3 or S4.  No murmur, no rubs, or gallops appreciated. Abdomen: Appears non-distended. No obvious abdominal masses. Msk:  Strength and tone appear normal for age. No obvious joint deformities or effusions. Extremities: No clubbing or cyanosis. 1+ pitting edema bilaterally.  Distal pedal pulses are 2+ bilaterally. Neuro: Alert and oriented X 3. Moves all extremities spontaneously. No focal deficits noted. Psych:  Responds to questions appropriately with a normal affect. Skin: No rashes or lesions noted  Wt Readings from Last 3 Encounters:  02/27/22 227 lb (103 kg)  01/01/22 216 lb (98 kg)  12/31/21 220 lb 9.6 oz (100.1 kg)    Studies/Labs Reviewed:   EKG:  EKG is not ordered today.   Recent Labs: 06/25/2021: TSH 3.640 12/11/2021: ALT 24 12/13/2021: B Natriuretic Peptide 685.0; Hemoglobin 8.8; Platelets 279 12/15/2021: Magnesium 2.4 12/16/2021: BUN 63; Creatinine, Ser 2.16; Potassium 4.2; Sodium 139 12/31/2021: Pro B Natriuretic peptide (BNP) 829.0   Lipid Panel    Component Value Date/Time   CHOL 134 03/22/2015 0816   TRIG 92 03/22/2015 0816   HDL 41 03/22/2015 0816   CHOLHDL 3.3 03/22/2015 0816   VLDL 18 03/22/2015 0816   LDLCALC 75 03/22/2015 0816    Additional studies/ records that were reviewed today include:   Echocardiogram: 12/11/2021 IMPRESSIONS     1. Septal movement consistent with  postop state. Left ventricular  ejection fraction, by estimation, is 45 to 50%. The left ventricle has  mildly decreased function. The left ventricle demonstrates global  hypokinesis. Left ventricular diastolic function  could not be evaluated.   2. Right ventricular systolic function is normal. The right ventricular  size is mildly enlarged. There is moderately elevated pulmonary artery  systolic pressure. The estimated right ventricular systolic pressure is  00.9 mmHg.   3. Left atrial size was moderately dilated.   4. Right atrial size was mild to moderately dilated.   5. The mitral valve is grossly normal. Trivial mitral valve  regurgitation. No evidence of mitral stenosis.   6. The aortic valve is tricuspid. Aortic valve regurgitation is not  visualized. Aortic valve sclerosis is present, with no evidence of aortic  valve stenosis.   7. The inferior vena cava is dilated in size with >50% respiratory  variability, suggesting right atrial pressure of 8 mmHg.   Comparison(s): No significant change from prior study.   Assessment:    1. HFrEF (heart failure with reduced ejection fraction) ([place])   2. Coronary artery disease involving native coronary artery of native heart without angina pectoris   3. Persistent atrial fibrillation ([place])   4. Essential hypertension   5. Stage 3b chronic kidney disease ([place])      Plan:   In order of problems listed above:  1. HFmrEF - His ejection fraction was at 45 to 50% by most recent imaging in 11/2021. I suspect that his current symptoms are likely due to an  acute CHF exacerbation in the setting of his 10 pound weight gain over the past month. He is currently taking Lasix 40 mg daily and I recommended that we increase this to 40 mg twice daily for the next week. Will then check a repeat BNP and BMET. His BNP was elevated to 829 last month when checked by Pulmonology. I did ask him to follow daily weights in the interim as well and report back on  symptoms. - Continue Toprol-XL 25 mg daily and Lisinopril 40 mg daily. Could consider adding an SGLT2 inhibitor pending reassessment of his renal function.   2. CAD  - He is s/p CABG in 2015. He does report dyspnea on exertion as outlined above but I suspect this is secondary to fluid buildup as compared to ischemia at this time. No recent chest pain. - Remains on Atorvastatin 80 mg daily and Toprol-XL 25 mg daily. He is no longer on ASA given the need for anticoagulation.  3. Persistent Atrial Fibrillation - He underwent ablation in 2019 with repeat in 11/2020 and had recurrent atrial fibrillation afterwards and underwent DCCV in 06/2021. He remains on Amiodarone 200 mg daily and is maintaining normal sinus rhythm by examination today. Also on Toprol-XL 25 mg daily would not further titrate given his heart rate in the 50's. - No recurrent hemoptysis. He remains on Xarelto 15 mg daily which is the appropriate dose at this time given his calculated CrCl of 40 mL/min based off most recent labs and current weight.   4. HTN - His BP is elevated at 142/64 during today's visit but he reports this has been well controlled when checked at home. Continue current medical therapy for now and would reassess following diuresis.  5. Stage 3 CKD - Baseline creatinine 1.7 - 1.8. Was elevated to 2.16 in 11/2021. Will recheck a BMET in 7 days following diuretic dose adjustment.    Medication Adjustments/Labs and Tests Ordered: Current medicines are reviewed at length with the patient today.  Concerns regarding medicines are outlined above.  Medication changes, Labs and Tests ordered today are listed in the Patient Instructions below. Patient Instructions  Medication Instructions:  Your physician has recommended you make the following change in your medication:  - Increase Lasix to 40 mg tablets twice daily   Labwork: In 1 week: -BNP -BMP  Testing/Procedures: None  Follow-Up: Follow up with Dr.  Marlou Porch or Bernerd Pho in 2 months.   Any Other Special Instructions Will Be Listed Below (If Applicable).     If you need a refill on your cardiac medications before your next appointment, please call your pharmacy.    Signed, Erma Heritage, PA-C  02/27/2022 7:45 PM    [place] [address] [place], [place] [address] Phone: [phone] Fax: [phone]

## 2022-02-27 NOTE — Patient Instructions (Signed)
Medication Instructions:  Your physician has recommended you make the following change in your medication:  - Increase Lasix to 40 mg tablets twice daily   Labwork: In 1 week: -BNP -BMP  Testing/Procedures: None  Follow-Up: Follow up with Dr. Marlou Porch or Bernerd Pho in 2 months.   Any Other Special Instructions Will Be Listed Below (If Applicable).     If you need a refill on your cardiac medications before your next appointment, please call your pharmacy.

## 2022-03-02 NOTE — Progress Notes (Unsigned)
Subjective:    Patient ID: Caleb Wolf, male    DOB: 1941/05/08, 81 y.o.   MRN: [id number]  HPI   Male former smoker followed for OSA, complicated by HBP, CAD/MI/CABG, carotid stenosis, osteoarthritis hip, GERD NPSG 07/2012  AHI 15/ hr, CPAP to 10 ==================================================================================   08/26/21- 81 year old male former smoker followed for OSA, complicated by HBP, CAD/MI/CABG/AFib/ Xarelto, carotid stenosis, osteoarthritis hip, GERD CPAP auto 5-20/Adapt Download-compliance 93%, AHI 10.1/ hr Body weight today-237 lbs Covid vax-3 Moderna Flu vax-had -----Patient is doing good, no concerns. Download reviewed. Hose disconnects easily, but ok. He will discuss with DME. Cardiology follows for AFib- currently NSR.  03/04/22-  81 year old male former smoker followed for OSA, complicated by HTN, CAD/MI/CABG/AFib/ Xarelto, carotid stenosis, osteoarthritis hip, GERD CPAP auto 5-20/Adapt O2 2L sleep/ Adapt Download-compliance 97%, AHI 15.4 ( mostly centrals) Body weight today 223 lbs Covid vax-3 Moderna Flu vax-pending from PCP LOV 8/1/23Volanda Napoleon, NP- Hosp f/u- recent sCHF hemoptysis on Xarelto, Possible BOOP or amiodarone effect after trip to [place]. Cardiology following. She ordered ONOX on CPAP/ room air.  Cardiology following actively.  CXR 01/29/22 IMPRESSION: 1. Mild right basilar atelectasis. 2. Small, stable right pleural effusion.   ROS-see HPI + = positive Constitutional:    weight loss, night sweats, fevers, chills, fatigue, lassitude. HEENT:    headaches, difficulty swallowing, tooth/dental problems, sore throat,       sneezing, itching, ear ache, nasal congestion, post nasal drip, snoring CV:    chest pain, orthopnea, PND, swelling in lower extremities, anasarca,                                               dizziness, palpitations Resp:   +shortness of breath with exertion or at rest.                productive cough,    non-productive cough, coughing up of blood.              change in color of mucus.  wheezing.   Skin:    rash or lesions. GI:  No-   heartburn, indigestion, abdominal pain, nausea, vomiting, diarrhea,                 change in bowel habits, loss of appetite GU: dysuria, change in color of urine, no urgency or frequency.   flank pain. MS:   joint pain, stiffness, decreased range of motion, back pain. Neuro-     nothing unusual Psych:  change in mood or affect.  depression or anxiety.   memory loss.    Objective:  OBJ- Physical Exam General- Alert, Oriented, Affect-appropriate, Distress- none acute, + overweight Skin- rash-none, lesions- none, excoriation- none Lymphadenopathy- none Head- atraumatic            Eyes- Gross vision intact, PERRLA, conjunctivae and secretions clear            Ears- Hearing, canals-normal            Nose- Clear, no-Septal dev, mucus, polyps, erosion, perforation             Throat- Mallampati III-IV, mucosa clear , drainage- none, tonsils- atrophic Neck- flexible , trachea midline, no stridor , thyroid nl, carotid no bruit Chest - symmetrical excursion , unlabored           Heart/CV- RR+today , no murmur ,  no gallop  , no rub, nl s1 s2                           - JVD- none , edema- none, stasis changes- none, varices- none           Lung- clear to P&A, wheeze- none, cough- none , dullness-none, rub- none           Chest wall-  Abd-  Br/ Gen/ Rectal- Not done, not indicated Extrem- cyanosis- none, clubbing, none, atrophy- none, strength- nl Neuro- grossly intact to observation

## 2022-03-03 DIAGNOSIS — G4733 Obstructive sleep apnea (adult) (pediatric): Secondary | ICD-10-CM | POA: Diagnosis not present

## 2022-03-04 ENCOUNTER — Ambulatory Visit (INDEPENDENT_AMBULATORY_CARE_PROVIDER_SITE_OTHER): Payer: Medicare HMO

## 2022-03-04 ENCOUNTER — Ambulatory Visit: Payer: Medicare HMO | Admitting: Internal Medicine

## 2022-03-04 ENCOUNTER — Encounter: Payer: Self-pay | Admitting: Internal Medicine

## 2022-03-04 VITALS — BP 120/60 | HR 83 | Ht 71.0 in | Wt 223.4 lb

## 2022-03-04 DIAGNOSIS — J90 Pleural effusion, not elsewhere classified: Secondary | ICD-10-CM

## 2022-03-04 DIAGNOSIS — G4733 Obstructive sleep apnea (adult) (pediatric): Secondary | ICD-10-CM | POA: Diagnosis not present

## 2022-03-04 DIAGNOSIS — G4731 Primary central sleep apnea: Secondary | ICD-10-CM

## 2022-03-04 DIAGNOSIS — J9811 Atelectasis: Secondary | ICD-10-CM | POA: Diagnosis not present

## 2022-03-04 NOTE — Patient Instructions (Signed)
Order- schedule overnight oximetry on CPAP, on O2 2L  Order- CXR   dx pleural effusion

## 2022-03-05 ENCOUNTER — Encounter: Payer: Self-pay | Admitting: Internal Medicine

## 2022-03-05 DIAGNOSIS — G4731 Primary central sleep apnea: Secondary | ICD-10-CM | POA: Insufficient documentation

## 2022-03-05 NOTE — Assessment & Plan Note (Signed)
Benefits from CPAP with good control of Obstructive apneas. Plan- continue auto 5-20 with O2 2l for sleep. Possible change to BIPAP S/T if CSA become important.

## 2022-03-05 NOTE — Assessment & Plan Note (Signed)
Probably reflects delayed cerebrovascular perfusion feed-back due to ASCVD.  Possible treatment-emergent component. We don't need to "treat the number", but if associated with significant sleep disturbance or desaturation, separate from his nocturia, then BIPAP S/T can be tried. Plan- overnight oximetry on his CPAP with his O2 2L.

## 2022-03-06 ENCOUNTER — Ambulatory Visit (HOSPITAL_COMMUNITY): Payer: Medicare HMO | Attending: Internal Medicine

## 2022-03-06 DIAGNOSIS — I5020 Unspecified systolic (congestive) heart failure: Secondary | ICD-10-CM | POA: Diagnosis not present

## 2022-03-06 DIAGNOSIS — R262 Difficulty in walking, not elsewhere classified: Secondary | ICD-10-CM

## 2022-03-06 DIAGNOSIS — M25551 Pain in right hip: Secondary | ICD-10-CM | POA: Diagnosis not present

## 2022-03-06 DIAGNOSIS — R29898 Other symptoms and signs involving the musculoskeletal system: Secondary | ICD-10-CM

## 2022-03-06 DIAGNOSIS — I2510 Atherosclerotic heart disease of native coronary artery without angina pectoris: Secondary | ICD-10-CM | POA: Diagnosis not present

## 2022-03-06 NOTE — Therapy (Signed)
OUTPATIENT PHYSICAL THERAPY NEURO EVALUATION   Patient Name: Caleb Wolf MRN: [id number] DOB:January 31, 1941, 81 y.o., male Today's Date: 03/06/2022   PCP: Asencion Noble, MD  REFERRING PROVIDER: Asencion Noble, MD    PT End of Session - 03/06/22 1439     Visit Number 1    Number of Visits 8    Date for PT Re-Evaluation 04/03/22    Authorization Type [employer] HMO    Authorization Time Period no VL, no co insurance, no auth, copay $35    PT Start Time 1440    PT Stop Time 1515    PT Time Calculation (min) 35 min             Past Medical History:  Diagnosis Date   AAA (abdominal aortic aneurysm) ([place])    Arthritis    Basal cell carcinoma    Carotid stenosis    Chronic kidney disease, stage 3b ([place]) 12/13/2021   Coronary artery disease    Multvessel s/p CABG 2015   Enlarged prostate    Essential hypertension    GERD (gastroesophageal reflux disease)    Gout    History of colon polyps    History of kidney stones    Hyperlipidemia    Lumbar disc disease    Persistent atrial fibrillation ([place])    Sleep apnea    CPAP   Past Surgical History:  Procedure Laterality Date   ABLATION OF DYSRHYTHMIC FOCUS  07/28/2017   ATRIAL FIBRILLATION ABLATION N/A 07/28/2017   Procedure: ATRIAL FIBRILLATION ABLATION;  Surgeon: Thompson Grayer, MD;  Location: [place] [facility];  Service: Cardiovascular;  Laterality: N/A;   ATRIAL FIBRILLATION ABLATION N/A 12/04/2020   Procedure: ATRIAL FIBRILLATION ABLATION;  Surgeon: Thompson Grayer, MD;  Location: [place] [facility];  Service: Cardiovascular;  Laterality: N/A;   BACK SURGERY     CARDIAC CATHETERIZATION  05/22/2014   Procedure: IABP INSERTION;  Surgeon: Leonie Man, MD;  Location: [facility] CATH LAB;  Service: Cardiovascular;;   CARDIOVERSION N/A 04/29/2017   Procedure: CARDIOVERSION;  Surgeon: Satira Sark, MD;  [facility];  Service: Cardiovascular;  Laterality: N/A;   CARDIOVERSION N/A 08/13/2017   Procedure: CARDIOVERSION;   Surgeon: Pixie Casino, MD;  Location: [place];  Service: Cardiovascular;  Laterality: N/A;   CARDIOVERSION N/A 08/15/2019   Procedure: CARDIOVERSION;  Surgeon: Dorothy Spark, MD;  Location: [facility] ENDOSCOPY;  Service: Cardiovascular;  Laterality: N/A;   CARDIOVERSION N/A 03/09/2020   Procedure: CARDIOVERSION;  Surgeon: Buford Dresser, MD;  Location: [place];  Service: Cardiovascular;  Laterality: N/A;   CARDIOVERSION N/A 07/06/2020   Procedure: CARDIOVERSION;  Surgeon: Sanda Klein, MD;  Location: [place];  Service: Cardiovascular;  Laterality: N/A;   CARDIOVERSION N/A 01/18/2021   Procedure: CARDIOVERSION;  Surgeon: Skeet Latch, MD;  Location: [facility] ENDOSCOPY;  Service: Cardiovascular;  Laterality: N/A;   CARDIOVERSION N/A 06/17/2021   Procedure: CARDIOVERSION;  Surgeon: Geralynn Rile, MD;  Location: [place];  Service: Cardiovascular;  Laterality: N/A;   Cataract surgery Right    COLONOSCOPY     COLONOSCOPY N/A 12/30/2017   Procedure: COLONOSCOPY;  Surgeon: Rogene Houston, MD;  [facility];  Service: Endoscopy;  Laterality: N/A;   CORONARY ARTERY BYPASS GRAFT N/A 05/22/2014   Procedure: CORONARY ARTERY BYPASS GRAFTING (CABG) times three using left internal mammary and right saphenous vein.;  Surgeon: Melrose Nakayama, MD;  Location: [place];  Service: Open Heart Surgery;  Laterality: N/A;   ENDARTERECTOMY Left  02/17/2014   Procedure: ENDARTERECTOMY CAROTID WITH PATCH ANGIOPLASTY;  Surgeon: Mal Misty, MD;  Location: [place];  Service: Vascular;  Laterality: Left;   ESOPHAGEAL DILATION N/A 08/22/2015   Procedure: ESOPHAGEAL DILATION;  Surgeon: Rogene Houston, MD;  [facility];  Service: Endoscopy;  Laterality: N/A;   ESOPHAGOGASTRODUODENOSCOPY N/A 08/22/2015   Procedure: ESOPHAGOGASTRODUODENOSCOPY (EGD);  Surgeon: Rogene Houston, MD;  [facility];  Service: Endoscopy;  Laterality: N/A;  12:45 - moved to 1:55 - Ann  notified pt   ESOPHAGOGASTRODUODENOSCOPY N/A 12/30/2017   Procedure: ESOPHAGOGASTRODUODENOSCOPY (EGD);  Surgeon: Rogene Houston, MD;  [facility];  Service: Endoscopy;  Laterality: N/A;  200   EYE SURGERY     cataract extraction (right) with repair macular tear , with IOL     right   FRACTURE SURGERY     bilateral wrist fractures- one ORIF   JOINT REPLACEMENT  2011   left knee   LEFT HEART CATHETERIZATION WITH CORONARY ANGIOGRAM N/A 05/22/2014   Procedure: LEFT HEART CATHETERIZATION WITH CORONARY ANGIOGRAM;  Surgeon: Leonie Man, MD;  Location: [facility] CATH LAB;  Service: Cardiovascular;  Laterality: N/A;   LITHOTRIPSY     PARS PLANA VITRECTOMY W/ REPAIR OF MACULAR HOLE     RHINOPLASTY     TEE WITHOUT CARDIOVERSION N/A 04/29/2017   Procedure: TRANSESOPHAGEAL ECHOCARDIOGRAM (TEE) WITH PROPOFOL;  Surgeon: Satira Sark, MD;  [facility];  Service: Cardiovascular;  Laterality: N/A;   TONSILLECTOMY     TOTAL HIP ARTHROPLASTY  12/08/2011   Procedure: TOTAL HIP ARTHROPLASTY;  Surgeon: Gearlean Alf, MD;  Location: [facility];  Service: Orthopedics;  Laterality: Right;   UPPER GI ENDOSCOPY  12/18/2015   Procedure: UPPER GI ENDOSCOPY;  Surgeon: Ralene Ok, MD;  Location: [facility];  Service: General;;   WRIST SURGERY Right 24yr ago   WRIST SURGERY Left    Patient Active Problem List   Diagnosis Date Noted   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   Ground glass opacity present on imaging of lung 12/31/2021   Chronic kidney disease, stage 3b ([place] 12/13/2021   Hemoptysis 12/13/2021   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) ([place] 12/13/2021   CAD (coronary artery disease) 12/10/2021   Acute respiratory failure with hypoxia ([place] 12/10/2021   Posterior vitreous detachment of left eye 02/13/2020   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   Vitreous floaters, left 02/13/2020   History of vitrectomy 02/13/2020   Left epiretinal membrane 02/13/2020   HLD (hyperlipidemia)  12/07/2019   Low back pain 11/15/2019   Lumbar post-laminectomy syndrome 11/15/2019   Spinal stenosis of lumbar region 11/15/2019   Pain in joint of right hip 06/09/2019   Trigger finger 04/08/2019   Special screening for malignant neoplasms, colon 11/25/2017   Absolute anemia 11/25/2017   Persistent atrial fibrillation ([place] 07/28/2017   GERD (gastroesophageal reflux disease) 04/28/2017   AKI (acute kidney injury) ([place] 04/28/2017   Achalasia 12/18/2015   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   Encounter for therapeutic drug monitoring 05/31/2014   Atrial fibrillation with rapid ventricular response ([place] 05/22/2014   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   Abnormal EKG    NSTEMI (non-ST elevated myocardial infarction) ([facility]    Carotid artery stenosis without cerebral infarction    Benign non-nodular prostatic hyperplasia with lower urinary tract symptoms 03/27/2014   Chest pain 02/09/2014   HTN (hypertension) 02/09/2014   Carotid stenosis 08/02/2013   Peripheral edema 08/02/2013   Urge incontinence of urine  10/04/2012   OSA (obstructive sleep apnea) 09/21/2012   Recurrent nephrolithiasis 04/05/2012   Difficulty walking 01/29/2012   Balance disorder 01/29/2012   OA (osteoarthritis) of hip 12/08/2011   History of revision of total replacement of right hip joint 12/08/2011   ED (erectile dysfunction) of organic origin 04/07/2011   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   History of total knee replacement, left 09/10/2009    ONSET DATE: July 2023 had pneumonia  REFERRING DIAG: PT eval/tx for gait and balance per Asencion Noble, MD   THERAPY DIAG:  Difficulty in walking, not elsewhere classified - Plan: PT plan of care cert/re-cert  Other symptoms and signs involving the musculoskeletal system - Plan: PT plan of care cert/re-cert  Pain in right hip  Rationale for Evaluation and Treatment Rehabilitation  SUBJECTIVE:                                                                                                                                                                                               SUBJECTIVE STATEMENT: " My balance is not good"; reports increased weakness since hospitalization in July for pneumonia.  Had a fall right after d/c from hospital and landed on Right hip. Currently holding fluid  Pt accompanied by: significant other Catalina Lunger  PERTINENT HISTORY:  CHF R THA L TKA Spinal stenosis MI  PAIN:  Are you having pain? Yes: NPRS scale: 0-7/10 Pain location: Right hip Pain description: sore, achy Aggravating factors: standing, walking Relieving factors: sitting, resting, tylenol  PRECAUTIONS: Fall  WEIGHT BEARING RESTRICTIONS No  FALLS: Has patient fallen in last 6 months? Yes. Number of falls 1  LIVING ENVIRONMENT: Lives with: lives with their spouse Lives in: House/apartment Stairs: No Has following equipment at home: Single point cane, Environmental consultant - 2 wheeled, Environmental consultant - 4 wheeled, shower chair, Grab bars, and None  PLOF: Independent with household mobility with device  PATIENT GOALS ease pain in my hip  OBJECTIVE:   DIAGNOSTIC FINDINGS: none recent  COGNITION: Overall cognitive status: Within functional limits for tasks assessed   SENSATION: WFL   EDEMA: bilateral legs wearing compression stockings       LOWER EXTREMITY MMT:    MMT Right Eval Left Eval  Hip flexion 4- 4  Hip extension    Hip abduction    Hip adduction    Hip internal rotation    Hip external rotation    Knee flexion    Knee extension 4+ 5  Ankle dorsiflexion 4- 5  Ankle plantarflexion    Ankle inversion    Ankle eversion    (Blank rows = not tested)  GAIT: Gait pattern: decreased stance time- Right and decreased hip/knee flexion- Right Distance walked: 278 ft Assistive device utilized: Single point cane Level of assistance: SBA Comments: wearing 2 L of O2  FUNCTIONAL TESTs:  5 times sit to stand: 16 sec 2 minute walk test: 278 ft  with SPC and 2L of  O2  SLS Right 1 sec, Left 4 sec  PATIENT SURVEYS:  FOTO 50  TODAY'S TREATMENT:  Physical therapy evaluation and HEP instruction   PATIENT EDUCATION:  Education details: Patient educated on exam findings, POC, scope of PT, HEP. Person educated: Patient Education method: Explanation, Demonstration, and Handouts Education comprehension: verbalized understanding, returned demonstration, verbal cues required, and tactile cues required    HOME EXERCISE PROGRAM: Access Code: [email or web address] URL: [email or web address] Date: 03/06/2022 Prepared by: [facility] - Rehab  Exercises - Sit to Stand with Counter Support  - 3 x daily - 7 x weekly - 1 sets - 10 reps - Standing Single Leg Stance with Counter Support  - 1 x daily - 7 x weekly - 1 sets - 3 reps - 10" hold - Standing Tandem Balance with Counter Support  - 1 x daily - 7 x weekly - 1 sets - 10 reps - 10" hold    GOALS: Goals reviewed with patient? No  SHORT TERM GOALS: Target date: 03/20/2022   patient will be independent with initial HEP Baseline: Goal status: INITIAL  2.  Patient will be able to stand 5 sec on each leg with SLS to improve functional balance and demonstrate decreased fall risk.  Baseline:  Goal status: INITIAL   LONG TERM GOALS: Target date: 04/03/2022   Patient will be independent with advanced HEP and self management strategies to improve quality of life and functional outcomes.  Baseline:  Goal status: INITIAL  2.  Patient will report at least 50% improvement in overall symptoms and/or function to demonstrate improved functional mobility  Baseline:  Goal status: INITIAL  3.  Patient will be able to stand 10 sec on each leg with SLS to improve functional balance and demonstrate decreased fall risk.  Baseline:  Goal status: INITIAL  4.  Patient will improve 5 x STS score from 16 sec to 13 sec to demonstrate improved functional mobility and increased lower extremity strength.  Baseline:   Goal status: INITIAL  5.  Patient will increase 2 MWT to 300 ft to demonstrate improved functional mobility. Baseline: 278 ft Goal status: INITIAL   ASSESSMENT:  CLINICAL IMPRESSION: Patient is a 81  y.o. pleasant male who was seen today for physical therapy evaluation and treatment for PT eval/tx for gait and balance per Asencion Noble, MD . He presents on evaluation with antalgic gait; balance deficits, fall risk per history and presentation, weakness in lower extremities and right hip area pain that all negatively impact his ability walk household and community distances and perform household tasks.Patient will benefit from continued skilled therapy services  to address deficits and promote return to optimal function.       OBJECTIVE IMPAIRMENTS Abnormal gait, cardiopulmonary status limiting activity, decreased activity tolerance, decreased balance, decreased endurance, decreased mobility, difficulty walking, decreased ROM, decreased strength, hypomobility, increased edema, increased fascial restrictions, impaired perceived functional ability, impaired flexibility, and pain.   ACTIVITY LIMITATIONS carrying, lifting, bending, standing, squatting, sleeping, stairs, transfers, locomotion level, and caring for others  PARTICIPATION LIMITATIONS: meal prep, cleaning, laundry, driving, shopping, community activity, and yard work  PERSONAL FACTORS 1-2 comorbidities: CHF, HBP  are also affecting patient's functional  outcome.   REHAB POTENTIAL: Good  CLINICAL DECISION MAKING: Evolving/moderate complexity  EVALUATION COMPLEXITY: Moderate  PLAN: PT FREQUENCY: 2x/week  PT DURATION: 4 weeks  PLANNED INTERVENTIONS: Therapeutic exercises, Therapeutic activity, Neuromuscular re-education, Balance training, Gait training, Patient/Family education, Joint manipulation, Joint mobilization, Stair training, Orthotic/Fit training, DME instructions, Aquatic Therapy, Dry Needling, Electrical stimulation,  Spinal manipulation, Spinal mobilization, Cryotherapy, Moist heat, Compression bandaging, scar mobilization, Splintting, Taping, Traction, Ultrasound, Ionotophoresis '4mg'$ /ml Dexamethasone, and Manual therapy  PLAN FOR NEXT SESSION: please test hip extension and abduction strength. Review HEP and goals; progress lower extremity strengthening and balance as able.   4:08 PM, 03/06/22 Dorismar Chay Small Mikale Silversmith MPT  physical therapy [place] [phone]

## 2022-03-07 ENCOUNTER — Telehealth: Payer: Self-pay

## 2022-03-07 DIAGNOSIS — I5020 Unspecified systolic (congestive) heart failure: Secondary | ICD-10-CM

## 2022-03-07 LAB — BASIC METABOLIC PANEL
BUN/Creatinine Ratio: 12 (ref 10–24)
BUN: 22 mg/dL (ref 8–27)
CO2: 24 mmol/L (ref 20–29)
Calcium: 9 mg/dL (ref 8.6–10.2)
Chloride: 101 mmol/L (ref 96–106)
Creatinine, Ser: 1.8 mg/dL — ABNORMAL HIGH (ref 0.76–1.27)
Glucose: 102 mg/dL — ABNORMAL HIGH (ref 70–99)
Potassium: 4.2 mmol/L (ref 3.5–5.2)
Sodium: 144 mmol/L (ref 134–144)
eGFR: 38 mL/min/{1.73_m2} — ABNORMAL LOW (ref >59)

## 2022-03-07 LAB — BRAIN NATRIURETIC PEPTIDE: BNP: 510.5 pg/mL — ABNORMAL HIGH (ref 0.0–100.0)

## 2022-03-07 MED ORDER — FUROSEMIDE 40 MG PO TABS: 60.0000 mg | ORAL_TABLET | Freq: Every day | ORAL | 3 refills | Status: DC

## 2022-03-07 NOTE — Telephone Encounter (Signed)
-----   Message from Erma Heritage, [place] sent at 03/07/2022  3:08 PM EDT ----- I am glad to hear that his weight has returned to baseline. Given that his fluid level still remains elevated and recent chest x-ray by Pulmonology showed a small to moderate right pleural effusion, I would recommend that we gradually reduce his Lasix from 40 mg BID to '60mg'$  daily (can take as once daily or '40mg'$  in AM/'20mg'$  in afternoon - whichever he prefers). Repeat BNP and BMET in 2 weeks.

## 2022-03-07 NOTE — Telephone Encounter (Signed)
Erma Heritage, PA-C  03/07/2022  2:41 PM EDT     Please let the patient know his fluid level remains elevated but has improved as this was previously at 829 in 12/2021 and is down to 510. His kidney function has improved as his creatinine was previously elevated to 2.16 and is now down to 1.80. Sodium and potassium are within a normal range. Can we see how he is feeling since Lasix was increased to twice daily dosing?  Has his weight declined at home?  If things are not back to baseline, can continue with twice daily dosing and recheck a BMET in 2 weeks.    Berlinda Last, [place]  03/07/2022  2:50 PM EDT     Spoke to pt who verbalized understanding of results. Pt stated that he has been using the restroom frequently since increasing the dose of his lasix and has had no energy. Pt stated that his weight is currently at 216 lbs and would like to know if he should continue the BID dosing. Please advise.     Patient notified and verbalized understanding. Patient agreeable to medication changes and repeat lab work in 2 weeks.

## 2022-03-10 ENCOUNTER — Ambulatory Visit (HOSPITAL_COMMUNITY): Payer: Medicare HMO

## 2022-03-10 DIAGNOSIS — M25551 Pain in right hip: Secondary | ICD-10-CM

## 2022-03-10 DIAGNOSIS — R29898 Other symptoms and signs involving the musculoskeletal system: Secondary | ICD-10-CM | POA: Diagnosis not present

## 2022-03-10 DIAGNOSIS — R262 Difficulty in walking, not elsewhere classified: Secondary | ICD-10-CM

## 2022-03-10 NOTE — Therapy (Signed)
OUTPATIENT PHYSICAL THERAPY NEURO TREATMENT   Patient Name: Caleb Wolf MRN: [id number] DOB:Apr 04, 1941, 81 y.o., male Today's Date: 03/10/2022   PCP: Asencion Noble, MD  REFERRING PROVIDER: Asencion Noble, MD    PT End of Session - 03/10/22 1347     Visit Number 2    Number of Visits 8    Date for PT Re-Evaluation 04/03/22    Authorization Type [employer] HMO    Authorization Time Period no VL, no co insurance, no auth, copay $35    PT Start Time 1304    PT Stop Time 1345    PT Time Calculation (min) 41 min              Past Medical History:  Diagnosis Date   AAA (abdominal aortic aneurysm) ([place])    Arthritis    Basal cell carcinoma    Carotid stenosis    Chronic kidney disease, stage 3b ([place]) 12/13/2021   Coronary artery disease    Multvessel s/p CABG 2015   Enlarged prostate    Essential hypertension    GERD (gastroesophageal reflux disease)    Gout    History of colon polyps    History of kidney stones    Hyperlipidemia    Lumbar disc disease    Persistent atrial fibrillation ([place])    Sleep apnea    CPAP   Past Surgical History:  Procedure Laterality Date   ABLATION OF DYSRHYTHMIC FOCUS  07/28/2017   ATRIAL FIBRILLATION ABLATION N/A 07/28/2017   Procedure: ATRIAL FIBRILLATION ABLATION;  Surgeon: Thompson Grayer, MD;  Location: [place] [facility];  Service: Cardiovascular;  Laterality: N/A;   ATRIAL FIBRILLATION ABLATION N/A 12/04/2020   Procedure: ATRIAL FIBRILLATION ABLATION;  Surgeon: Thompson Grayer, MD;  Location: [place] [facility];  Service: Cardiovascular;  Laterality: N/A;   BACK SURGERY     CARDIAC CATHETERIZATION  05/22/2014   Procedure: IABP INSERTION;  Surgeon: Leonie Man, MD;  Location: [facility] CATH LAB;  Service: Cardiovascular;;   CARDIOVERSION N/A 04/29/2017   Procedure: CARDIOVERSION;  Surgeon: Satira Sark, MD;  [facility];  Service: Cardiovascular;  Laterality: N/A;   CARDIOVERSION N/A 08/13/2017   Procedure: CARDIOVERSION;   Surgeon: Pixie Casino, MD;  Location: [place];  Service: Cardiovascular;  Laterality: N/A;   CARDIOVERSION N/A 08/15/2019   Procedure: CARDIOVERSION;  Surgeon: Dorothy Spark, MD;  Location: [facility] ENDOSCOPY;  Service: Cardiovascular;  Laterality: N/A;   CARDIOVERSION N/A 03/09/2020   Procedure: CARDIOVERSION;  Surgeon: Buford Dresser, MD;  Location: [place];  Service: Cardiovascular;  Laterality: N/A;   CARDIOVERSION N/A 07/06/2020   Procedure: CARDIOVERSION;  Surgeon: Sanda Klein, MD;  Location: [place];  Service: Cardiovascular;  Laterality: N/A;   CARDIOVERSION N/A 01/18/2021   Procedure: CARDIOVERSION;  Surgeon: Skeet Latch, MD;  Location: [facility] ENDOSCOPY;  Service: Cardiovascular;  Laterality: N/A;   CARDIOVERSION N/A 06/17/2021   Procedure: CARDIOVERSION;  Surgeon: Geralynn Rile, MD;  Location: [place];  Service: Cardiovascular;  Laterality: N/A;   Cataract surgery Right    COLONOSCOPY     COLONOSCOPY N/A 12/30/2017   Procedure: COLONOSCOPY;  Surgeon: Rogene Houston, MD;  [facility];  Service: Endoscopy;  Laterality: N/A;   CORONARY ARTERY BYPASS GRAFT N/A 05/22/2014   Procedure: CORONARY ARTERY BYPASS GRAFTING (CABG) times three using left internal mammary and right saphenous vein.;  Surgeon: Melrose Nakayama, MD;  Location: [place];  Service: Open Heart Surgery;  Laterality: N/A;   ENDARTERECTOMY  Left 02/17/2014   Procedure: ENDARTERECTOMY CAROTID WITH PATCH ANGIOPLASTY;  Surgeon: Mal Misty, MD;  Location: [place];  Service: Vascular;  Laterality: Left;   ESOPHAGEAL DILATION N/A 08/22/2015   Procedure: ESOPHAGEAL DILATION;  Surgeon: Rogene Houston, MD;  [facility];  Service: Endoscopy;  Laterality: N/A;   ESOPHAGOGASTRODUODENOSCOPY N/A 08/22/2015   Procedure: ESOPHAGOGASTRODUODENOSCOPY (EGD);  Surgeon: Rogene Houston, MD;  [facility];  Service: Endoscopy;  Laterality: N/A;  12:45 - moved to 1:55 - Ann  notified pt   ESOPHAGOGASTRODUODENOSCOPY N/A 12/30/2017   Procedure: ESOPHAGOGASTRODUODENOSCOPY (EGD);  Surgeon: Rogene Houston, MD;  [facility];  Service: Endoscopy;  Laterality: N/A;  200   EYE SURGERY     cataract extraction (right) with repair macular tear , with IOL     right   FRACTURE SURGERY     bilateral wrist fractures- one ORIF   JOINT REPLACEMENT  2011   left knee   LEFT HEART CATHETERIZATION WITH CORONARY ANGIOGRAM N/A 05/22/2014   Procedure: LEFT HEART CATHETERIZATION WITH CORONARY ANGIOGRAM;  Surgeon: Leonie Man, MD;  Location: [facility] CATH LAB;  Service: Cardiovascular;  Laterality: N/A;   LITHOTRIPSY     PARS PLANA VITRECTOMY W/ REPAIR OF MACULAR HOLE     RHINOPLASTY     TEE WITHOUT CARDIOVERSION N/A 04/29/2017   Procedure: TRANSESOPHAGEAL ECHOCARDIOGRAM (TEE) WITH PROPOFOL;  Surgeon: Satira Sark, MD;  [facility];  Service: Cardiovascular;  Laterality: N/A;   TONSILLECTOMY     TOTAL HIP ARTHROPLASTY  12/08/2011   Procedure: TOTAL HIP ARTHROPLASTY;  Surgeon: Gearlean Alf, MD;  Location: [facility];  Service: Orthopedics;  Laterality: Right;   UPPER GI ENDOSCOPY  12/18/2015   Procedure: UPPER GI ENDOSCOPY;  Surgeon: Ralene Ok, MD;  Location: [facility];  Service: General;;   WRIST SURGERY Right 99yr ago   WRIST SURGERY Left    Patient Active Problem List   Diagnosis Date Noted   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   Ground glass opacity present on imaging of lung 12/31/2021   Chronic kidney disease, stage 3b ([facility] 12/13/2021   Hemoptysis 12/13/2021   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) ([facility] 12/13/2021   CAD (coronary artery disease) 12/10/2021   Acute respiratory failure with hypoxia ([facility] 12/10/2021   Posterior vitreous detachment of left eye 02/13/2020   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   Vitreous floaters, left 02/13/2020   History of vitrectomy 02/13/2020   Left epiretinal membrane 02/13/2020   HLD (hyperlipidemia)  12/07/2019   Low back pain 11/15/2019   Lumbar post-laminectomy syndrome 11/15/2019   Spinal stenosis of lumbar region 11/15/2019   Pain in joint of right hip 06/09/2019   Trigger finger 04/08/2019   Special screening for malignant neoplasms, colon 11/25/2017   Absolute anemia 11/25/2017   Persistent atrial fibrillation ([facility] 07/28/2017   GERD (gastroesophageal reflux disease) 04/28/2017   AKI (acute kidney injury) ([facility] 04/28/2017   Achalasia 12/18/2015   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   Encounter for therapeutic drug monitoring 05/31/2014   Atrial fibrillation with rapid ventricular response ([facility] 05/22/2014   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   Abnormal EKG    NSTEMI (non-ST elevated myocardial infarction) ([facility]    Carotid artery stenosis without cerebral infarction    Benign non-nodular prostatic hyperplasia with lower urinary tract symptoms 03/27/2014   Chest pain 02/09/2014   HTN (hypertension) 02/09/2014   Carotid stenosis 08/02/2013   Peripheral edema 08/02/2013   Urge incontinence of  urine 10/04/2012   OSA (obstructive sleep apnea) 09/21/2012   Recurrent nephrolithiasis 04/05/2012   Difficulty walking 01/29/2012   Balance disorder 01/29/2012   OA (osteoarthritis) of hip 12/08/2011   History of revision of total replacement of right hip joint 12/08/2011   ED (erectile dysfunction) of organic origin 04/07/2011   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   History of total knee replacement, left 09/10/2009    ONSET DATE: July 2023 had pneumonia  REFERRING DIAG: PT eval/tx for gait and balance per Asencion Noble, MD   THERAPY DIAG:  Difficulty in walking, not elsewhere classified  Other symptoms and signs involving the musculoskeletal system  Pain in right hip  Rationale for Evaluation and Treatment Rehabilitation  SUBJECTIVE:                                                                                                                                                                                               SUBJECTIVE STATEMENT: Hip pain when I stand and walk; no pain at rest  Eval:" My balance is not good"; reports increased weakness since hospitalization in July for pneumonia.  Had a fall right after d/c from hospital and landed on Right hip. Currently holding fluid  Pt accompanied by: significant other Catalina Lunger  PERTINENT HISTORY:  CHF R THA L TKA Spinal stenosis MI  PAIN:  Are you having pain? Yes: NPRS scale: 06/10 Pain location: Right hip Pain description: sore, achy Aggravating factors: standing, walking Relieving factors: sitting, resting, tylenol  PRECAUTIONS: Fall  WEIGHT BEARING RESTRICTIONS No  FALLS: Has patient fallen in last 6 months? Yes. Number of falls 1  LIVING ENVIRONMENT: Lives with: lives with their spouse Lives in: House/apartment Stairs: No Has following equipment at home: Single point cane, Environmental consultant - 2 wheeled, Environmental consultant - 4 wheeled, shower chair, Grab bars, and None  PLOF: Independent with household mobility with device  PATIENT GOALS ease pain in my hip  OBJECTIVE:   DIAGNOSTIC FINDINGS: none recent  COGNITION: Overall cognitive status: Within functional limits for tasks assessed   SENSATION: WFL   EDEMA: bilateral legs wearing compression stockings       LOWER EXTREMITY MMT:    MMT Right Eval Left Eval  Hip flexion 4- 4  Hip extension 4- 4  Hip abduction 4- 4  Hip adduction    Hip internal rotation    Hip external rotation    Knee flexion    Knee extension 4+ 5  Ankle dorsiflexion 4- 5  Ankle plantarflexion    Ankle inversion    Ankle eversion    (Blank rows = not tested)  GAIT: Gait pattern: decreased stance time- Right and decreased hip/knee flexion- Right Distance walked: 278 ft Assistive device utilized: Single point cane Level of assistance: SBA Comments: wearing 2 L of O2  FUNCTIONAL TESTs:  5 times sit to stand: 16 sec 2 minute walk test: 278 ft  with SPC and 2L of O2  SLS  Right 1 sec, Left 4 sec  PATIENT SURVEYS:  FOTO 50  TODAY'S TREATMENT:  03/10/22 Review of HEP and goals Hip extension and abduction strength testing  Supine: Bridge x 8 LTR x 10 Hip adduction with ball 5" x 8 Hip abduction with GTB x 10  Sit to stand 3 x 5 from mat table without UE assist  Standing: // bars Tandem stance 2 x 30" each SLS 3 x 10" each 4" toe taps x 10 4" step ups x 10 each  O2 sat remained at 90% and above without oxygen on throughout treatment    Physical therapy evaluation and HEP instruction   PATIENT EDUCATION:  Education details: Patient educated on exam findings, POC, scope of PT, HEP. Person educated: Patient Education method: Explanation, Demonstration, and Handouts Education comprehension: verbalized understanding, returned demonstration, verbal cues required, and tactile cues required    HOME EXERCISE PROGRAM: Access Code: [email or web address] URL: [email or web address] Date: 03/06/2022 Prepared by: [facility] - [facility]  Exercises - Sit to Stand with Counter Support  - 3 x daily - 7 x weekly - 1 sets - 10 reps - Standing Single Leg Stance with Counter Support  - 1 x daily - 7 x weekly - 1 sets - 3 reps - 10" hold - Standing Tandem Balance with Counter Support  - 1 x daily - 7 x weekly - 1 sets - 10 reps - 10" hold    GOALS: Goals reviewed with patient? Yes  SHORT TERM GOALS: Target date: 03/20/2022   patient will be independent with initial HEP Baseline: Goal status: IN PROGRESS  2.  Patient will be able to stand 5 sec on each leg with SLS to improve functional balance and demonstrate decreased fall risk.  Baseline:  Goal status: IN PROGRESS   LONG TERM GOALS: Target date: 04/03/2022   Patient will be independent with advanced HEP and self management strategies to improve quality of life and functional outcomes.  Baseline:  Goal status: IN PROGRESS  2.  Patient will report at least 50% improvement in overall symptoms and/or  function to demonstrate improved functional mobility  Baseline:  Goal status: IN PROGRESS  3.  Patient will be able to stand 10 sec on each leg with SLS to improve functional balance and demonstrate decreased fall risk.  Baseline:  Goal status: IN PROGRESS  4.  Patient will improve 5 x STS score from 16 sec to 13 sec to demonstrate improved functional mobility and increased lower extremity strength.  Baseline:  Goal status: IN PROGRESS  5.  Patient will increase 2 MWT to 300 ft to demonstrate improved functional mobility. Baseline: 278 ft Goal status: IN PROGRESS   ASSESSMENT:  CLINICAL IMPRESSION: Today's session started with a review of HEP and goals. Patient verbalizes agreement with set rehab goals.testing of hip extensors and abductors revealed weakness.  Added bridge and hip strengthening exercise to HEP.  Patient's O2 sat remains at  90% and above throughout treatment with his oxygen off. Patient will benefit from continued skilled therapy services  to address deficits and promote return to optimal function.       OBJECTIVE IMPAIRMENTS Abnormal gait, cardiopulmonary status  limiting activity, decreased activity tolerance, decreased balance, decreased endurance, decreased mobility, difficulty walking, decreased ROM, decreased strength, hypomobility, increased edema, increased fascial restrictions, impaired perceived functional ability, impaired flexibility, and pain.   ACTIVITY LIMITATIONS carrying, lifting, bending, standing, squatting, sleeping, stairs, transfers, locomotion level, and caring for others  PARTICIPATION LIMITATIONS: meal prep, cleaning, laundry, driving, shopping, community activity, and yard work  PERSONAL FACTORS 1-2 comorbidities: CHF, HBP  are also affecting patient's functional outcome.   REHAB POTENTIAL: Good  CLINICAL DECISION MAKING: Evolving/moderate complexity  EVALUATION COMPLEXITY: Moderate  PLAN: PT FREQUENCY: 2x/week  PT DURATION: 4  weeks  PLANNED INTERVENTIONS: Therapeutic exercises, Therapeutic activity, Neuromuscular re-education, Balance training, Gait training, Patient/Family education, Joint manipulation, Joint mobilization, Stair training, Orthotic/Fit training, DME instructions, Aquatic Therapy, Dry Needling, Electrical stimulation, Spinal manipulation, Spinal mobilization, Cryotherapy, Moist heat, Compression bandaging, scar mobilization, Splintting, Taping, Traction, Ultrasound, Ionotophoresis '4mg'$ /ml Dexamethasone, and Manual therapy  PLAN FOR NEXT SESSION:   progress lower extremity strengthening and balance as able.   1:47 PM, 03/10/22 Rainey Kahrs Small Ciel Chervenak MPT [facility] [place] [phone]

## 2022-03-12 ENCOUNTER — Ambulatory Visit (HOSPITAL_COMMUNITY): Payer: Medicare HMO

## 2022-03-12 DIAGNOSIS — M25551 Pain in right hip: Secondary | ICD-10-CM

## 2022-03-12 DIAGNOSIS — R262 Difficulty in walking, not elsewhere classified: Secondary | ICD-10-CM | POA: Diagnosis not present

## 2022-03-12 DIAGNOSIS — R29898 Other symptoms and signs involving the musculoskeletal system: Secondary | ICD-10-CM | POA: Diagnosis not present

## 2022-03-12 NOTE — Therapy (Signed)
OUTPATIENT PHYSICAL THERAPY NEURO TREATMENT   Patient Name: Caleb Wolf MRN: [id number] DOB:1940-07-15, 81 y.o., male Today's Date: 03/12/2022   PCP: Asencion Noble, MD  REFERRING PROVIDER: Asencion Noble, MD    PT End of Session - 03/12/22 1118     Visit Number 3    Number of Visits 8    Date for PT Re-Evaluation 04/03/22    Authorization Type [employer] HMO    Authorization Time Period no VL, no co insurance, no auth, copay $35    PT Start Time 1118    PT Stop Time 1200    PT Time Calculation (min) 42 min               Past Medical History:  Diagnosis Date   AAA (abdominal aortic aneurysm) ([place])    Arthritis    Basal cell carcinoma    Carotid stenosis    Chronic kidney disease, stage 3b ([place]) 12/13/2021   Coronary artery disease    Multvessel s/p CABG 2015   Enlarged prostate    Essential hypertension    GERD (gastroesophageal reflux disease)    Gout    History of colon polyps    History of kidney stones    Hyperlipidemia    Lumbar disc disease    Persistent atrial fibrillation ([place])    Sleep apnea    CPAP   Past Surgical History:  Procedure Laterality Date   ABLATION OF DYSRHYTHMIC FOCUS  07/28/2017   ATRIAL FIBRILLATION ABLATION N/A 07/28/2017   Procedure: ATRIAL FIBRILLATION ABLATION;  Surgeon: Thompson Grayer, MD;  Location: [place] [facility];  Service: Cardiovascular;  Laterality: N/A;   ATRIAL FIBRILLATION ABLATION N/A 12/04/2020   Procedure: ATRIAL FIBRILLATION ABLATION;  Surgeon: Thompson Grayer, MD;  Location: [place] [facility];  Service: Cardiovascular;  Laterality: N/A;   BACK SURGERY     CARDIAC CATHETERIZATION  05/22/2014   Procedure: IABP INSERTION;  Surgeon: Leonie Man, MD;  Location: [facility] CATH LAB;  Service: Cardiovascular;;   CARDIOVERSION N/A 04/29/2017   Procedure: CARDIOVERSION;  Surgeon: Satira Sark, MD;  [facility];  Service: Cardiovascular;  Laterality: N/A;   CARDIOVERSION N/A 08/13/2017   Procedure: CARDIOVERSION;   Surgeon: Pixie Casino, MD;  Location: [place];  Service: Cardiovascular;  Laterality: N/A;   CARDIOVERSION N/A 08/15/2019   Procedure: CARDIOVERSION;  Surgeon: Dorothy Spark, MD;  Location: [facility] ENDOSCOPY;  Service: Cardiovascular;  Laterality: N/A;   CARDIOVERSION N/A 03/09/2020   Procedure: CARDIOVERSION;  Surgeon: Buford Dresser, MD;  Location: [place];  Service: Cardiovascular;  Laterality: N/A;   CARDIOVERSION N/A 07/06/2020   Procedure: CARDIOVERSION;  Surgeon: Sanda Klein, MD;  Location: [place];  Service: Cardiovascular;  Laterality: N/A;   CARDIOVERSION N/A 01/18/2021   Procedure: CARDIOVERSION;  Surgeon: Skeet Latch, MD;  Location: [facility] ENDOSCOPY;  Service: Cardiovascular;  Laterality: N/A;   CARDIOVERSION N/A 06/17/2021   Procedure: CARDIOVERSION;  Surgeon: Geralynn Rile, MD;  Location: [place];  Service: Cardiovascular;  Laterality: N/A;   Cataract surgery Right    COLONOSCOPY     COLONOSCOPY N/A 12/30/2017   Procedure: COLONOSCOPY;  Surgeon: Rogene Houston, MD;  [facility];  Service: Endoscopy;  Laterality: N/A;   CORONARY ARTERY BYPASS GRAFT N/A 05/22/2014   Procedure: CORONARY ARTERY BYPASS GRAFTING (CABG) times three using left internal mammary and right saphenous vein.;  Surgeon: Melrose Nakayama, MD;  Location: [place];  Service: Open Heart Surgery;  Laterality: N/A;  ENDARTERECTOMY Left 02/17/2014   Procedure: ENDARTERECTOMY CAROTID WITH PATCH ANGIOPLASTY;  Surgeon: Mal Misty, MD;  Location: [place];  Service: Vascular;  Laterality: Left;   ESOPHAGEAL DILATION N/A 08/22/2015   Procedure: ESOPHAGEAL DILATION;  Surgeon: Rogene Houston, MD;  [facility];  Service: Endoscopy;  Laterality: N/A;   ESOPHAGOGASTRODUODENOSCOPY N/A 08/22/2015   Procedure: ESOPHAGOGASTRODUODENOSCOPY (EGD);  Surgeon: Rogene Houston, MD;  [facility];  Service: Endoscopy;  Laterality: N/A;  12:45 - moved to 1:55 - Ann  notified pt   ESOPHAGOGASTRODUODENOSCOPY N/A 12/30/2017   Procedure: ESOPHAGOGASTRODUODENOSCOPY (EGD);  Surgeon: Rogene Houston, MD;  [facility];  Service: Endoscopy;  Laterality: N/A;  200   EYE SURGERY     cataract extraction (right) with repair macular tear , with IOL     right   FRACTURE SURGERY     bilateral wrist fractures- one ORIF   JOINT REPLACEMENT  2011   left knee   LEFT HEART CATHETERIZATION WITH CORONARY ANGIOGRAM N/A 05/22/2014   Procedure: LEFT HEART CATHETERIZATION WITH CORONARY ANGIOGRAM;  Surgeon: Leonie Man, MD;  Location[facility] CATH LAB;  Service: Cardiovascular;  Laterality: N/A;   LITHOTRIPSY     PARS PLANA VITRECTOMY W/ REPAIR OF MACULAR HOLE     RHINOPLASTY     TEE WITHOUT CARDIOVERSION N/A 04/29/2017   Procedure: TRANSESOPHAGEAL ECHOCARDIOGRAM (TEE) WITH PROPOFOL;  Surgeon: Satira Sark, MD;  [facility];  Service: Cardiovascular;  Laterality: N/A;   TONSILLECTOMY     TOTAL HIP ARTHROPLASTY  12/08/2011   Procedure: TOTAL HIP ARTHROPLASTY;  Surgeon: Gearlean Alf, MD;  Location: [facility];  Service: Orthopedics;  Laterality: Right;   UPPER GI ENDOSCOPY  12/18/2015   Procedure: UPPER GI ENDOSCOPY;  Surgeon: Ralene Ok, MD;  Location: [facility];  Service: General;;   WRIST SURGERY Right 78yr ago   WRIST SURGERY Left    Patient Active Problem List   Diagnosis Date Noted   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   Ground glass opacity present on imaging of lung 12/31/2021   Chronic kidney disease, stage 3b ([place] 12/13/2021   Hemoptysis 12/13/2021   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) ([place] 12/13/2021   CAD (coronary artery disease) 12/10/2021   Acute respiratory failure with hypoxia ([place] 12/10/2021   Posterior vitreous detachment of left eye 02/13/2020   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   Vitreous floaters, left 02/13/2020   History of vitrectomy 02/13/2020   Left epiretinal membrane 02/13/2020   HLD (hyperlipidemia)  12/07/2019   Low back pain 11/15/2019   Lumbar post-laminectomy syndrome 11/15/2019   Spinal stenosis of lumbar region 11/15/2019   Pain in joint of right hip 06/09/2019   Trigger finger 04/08/2019   Special screening for malignant neoplasms, colon 11/25/2017   Absolute anemia 11/25/2017   Persistent atrial fibrillation ([place] 07/28/2017   GERD (gastroesophageal reflux disease) 04/28/2017   AKI (acute kidney injury) ([place] 04/28/2017   Achalasia 12/18/2015   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   Encounter for therapeutic drug monitoring 05/31/2014   Atrial fibrillation with rapid ventricular response ([place] 05/22/2014   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   Abnormal EKG    NSTEMI (non-ST elevated myocardial infarction) ([facility]    Carotid artery stenosis without cerebral infarction    Benign non-nodular prostatic hyperplasia with lower urinary tract symptoms 03/27/2014   Chest pain 02/09/2014   HTN (hypertension) 02/09/2014   Carotid stenosis 08/02/2013   Peripheral edema 08/02/2013   Urge incontinence  of urine 10/04/2012   OSA (obstructive sleep apnea) 09/21/2012   Recurrent nephrolithiasis 04/05/2012   Difficulty walking 01/29/2012   Balance disorder 01/29/2012   OA (osteoarthritis) of hip 12/08/2011   History of revision of total replacement of right hip joint 12/08/2011   ED (erectile dysfunction) of organic origin 04/07/2011   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   History of total knee replacement, left 09/10/2009    ONSET DATE: July 2023 had pneumonia  REFERRING DIAG: PT eval/tx for gait and balance per Asencion Noble, MD   THERAPY DIAG:  Difficulty in walking, not elsewhere classified  Other symptoms and signs involving the musculoskeletal system  Pain in right hip  Rationale for Evaluation and Treatment Rehabilitation  SUBJECTIVE:                                                                                                                                                                                               SUBJECTIVE STATEMENT: Increased pain right hip today; 7/10; increased with standing and walking   Eval:" My balance is not good"; reports increased weakness since hospitalization in July for pneumonia.  Had a fall right after d/c from hospital and landed on Right hip. Currently holding fluid  Pt accompanied by: significant other Catalina Lunger  PERTINENT HISTORY:  CHF R THA L TKA Spinal stenosis MI  PAIN:  Are you having pain? Yes: NPRS scale: 06/10 Pain location: Right hip Pain description: sore, achy Aggravating factors: standing, walking Relieving factors: sitting, resting, tylenol  PRECAUTIONS: Fall  WEIGHT BEARING RESTRICTIONS No  FALLS: Has patient fallen in last 6 months? Yes. Number of falls 1  LIVING ENVIRONMENT: Lives with: lives with their spouse Lives in: House/apartment Stairs: No Has following equipment at home: Single point cane, Environmental consultant - 2 wheeled, Environmental consultant - 4 wheeled, shower chair, Grab bars, and None  PLOF: Independent with household mobility with device  PATIENT GOALS ease pain in my hip  OBJECTIVE:   DIAGNOSTIC FINDINGS: none recent  COGNITION: Overall cognitive status: Within functional limits for tasks assessed   SENSATION: WFL   EDEMA: bilateral legs wearing compression stockings       LOWER EXTREMITY MMT:    MMT Right Eval Left Eval  Hip flexion 4- 4  Hip extension 4- 4  Hip abduction 4- 4  Hip adduction    Hip internal rotation    Hip external rotation    Knee flexion    Knee extension 4+ 5  Ankle dorsiflexion 4- 5  Ankle plantarflexion    Ankle inversion    Ankle eversion    (Blank rows = not tested)  GAIT: Gait pattern: decreased stance time- Right and decreased hip/knee flexion- Right Distance walked: 278 ft Assistive device utilized: Single point cane Level of assistance: SBA Comments: wearing 2 L of O2  FUNCTIONAL TESTs:  5 times sit to stand: 16 sec 2 minute walk test: 278 ft  with  SPC and 2L of O2  SLS Right 1 sec, Left 4 sec  PATIENT SURVEYS:  FOTO 50  TODAY'S TREATMENT:  03/12/22 Supine: SKTC 3 x 20" LTR x 10 Hip adduction 5" x 10 Hip abduction 5" x 10 Bridge (painful) Manual hip distraction right x 8 TA bracing 5" hold x 8  Standing: Lumbar extensions x 10 (painful) Lateral hip glides x 10 Heel raises/ toe raises 2 x 10  Seated Repeated lumbar flexion x 8     03/10/22 Review of HEP and goals Hip extension and abduction strength testing  Supine: Bridge x 8 LTR x 10 Hip adduction with ball 5" x 8 Hip abduction with GTB x 10  Sit to stand 3 x 5 from mat table without UE assist  Standing: // bars Tandem stance 2 x 30" each SLS 3 x 10" each 4" toe taps x 10 4" step ups x 10 each  O2 sat remained at 90% and above without oxygen on throughout treatment    Physical therapy evaluation and HEP instruction   PATIENT EDUCATION:  Education details: Patient educated on exam findings, POC, scope of PT, HEP. Person educated: Patient Education method: Explanation, Demonstration, and Handouts Education comprehension: verbalized understanding, returned demonstration, verbal cues required, and tactile cues required    HOME EXERCISE PROGRAM: Access Code: [email or web address] URL: [email or web address] Date: 03/12/2022 Prepared by: [facility]  Exercises - Sit to Stand with Counter Support  - 3 x daily - 7 x weekly - 1 sets - 10 reps - Standing Single Leg Stance with Counter Support  - 2 x daily - 7 x weekly - 1 sets - 3 reps - 10" hold - Standing Tandem Balance with Counter Support  - 2 x daily - 7 x weekly - 1 sets - 10 reps - 10" hold - Supine Bridge  - 2 x daily - 7 x weekly - 1 sets - 10 reps - Supine Lower Trunk Rotation  - 2 x daily - 7 x weekly - 1 sets - 10 reps - Supine Hip Adduction Isometric with Ball  - 2 x daily - 7 x weekly - 1 sets - 10 reps - Hooklying Clamshell with Resistance  - 2 x daily - 7 x weekly - 1 sets - 10  reps - Seated Flexion Stretch  - 2 x daily - 7 x weekly - 1 sets - 10 reps - 2 sec hold - Hooklying Single Knee to Chest Stretch  - 2 x daily - 7 x weekly - 1 sets - 10 reps - 10 sec hold  Access Code: [email or web address] URL: [email or web address] Date: 03/06/2022 Prepared by: [facility]  Exercises - Sit to Stand with Counter Support  - 3 x daily - 7 x weekly - 1 sets - 10 reps - Standing Single Leg Stance with Counter Support  - 1 x daily - 7 x weekly - 1 sets - 3 reps - 10" hold - Standing Tandem Balance with Counter Support  - 1 x daily - 7 x weekly - 1 sets - 10 reps - 10" hold    GOALS: Goals reviewed with patient? Yes  SHORT TERM GOALS: Target date: 03/20/2022  patient will be independent with initial HEP Baseline: Goal status: IN PROGRESS  2.  Patient will be able to stand 5 sec on each leg with SLS to improve functional balance and demonstrate decreased fall risk.  Baseline:  Goal status: IN PROGRESS   LONG TERM GOALS: Target date: 04/03/2022   Patient will be independent with advanced HEP and self management strategies to improve quality of life and functional outcomes.  Baseline:  Goal status: IN PROGRESS  2.  Patient will report at least 50% improvement in overall symptoms and/or function to demonstrate improved functional mobility  Baseline:  Goal status: IN PROGRESS  3.  Patient will be able to stand 10 sec on each leg with SLS to improve functional balance and demonstrate decreased fall risk.  Baseline:  Goal status: IN PROGRESS  4.  Patient will improve 5 x STS score from 16 sec to 13 sec to demonstrate improved functional mobility and increased lower extremity strength.  Baseline:  Goal status: IN PROGRESS  5.  Patient will increase 2 MWT to 300 ft to demonstrate improved functional mobility. Baseline: 278 ft Goal status: IN PROGRESS   ASSESSMENT:  CLINICAL IMPRESSION: Patient arrives without his O2 today.  Throughout treatment his O2  sat remains between 89% and 97%.  Patient continues with pain right side than moves from glute and upper leg up into his back depending on activity.  Trial of standing extensions increased his pain down into his leg so d/c; trial of flexion based exercise today seems to slightly relieve pain.  Updated HEP.  Patient will benefit from continued skilled therapy services  to address deficits and promote return to optimal function.       OBJECTIVE IMPAIRMENTS Abnormal gait, cardiopulmonary status limiting activity, decreased activity tolerance, decreased balance, decreased endurance, decreased mobility, difficulty walking, decreased ROM, decreased strength, hypomobility, increased edema, increased fascial restrictions, impaired perceived functional ability, impaired flexibility, and pain.   ACTIVITY LIMITATIONS carrying, lifting, bending, standing, squatting, sleeping, stairs, transfers, locomotion level, and caring for others  PARTICIPATION LIMITATIONS: meal prep, cleaning, laundry, driving, shopping, community activity, and yard work  PERSONAL FACTORS 1-2 comorbidities: CHF, HBP  are also affecting patient's functional outcome.   REHAB POTENTIAL: Good  CLINICAL DECISION MAKING: Evolving/moderate complexity  EVALUATION COMPLEXITY: Moderate  PLAN: PT FREQUENCY: 2x/week  PT DURATION: 4 weeks  PLANNED INTERVENTIONS: Therapeutic exercises, Therapeutic activity, Neuromuscular re-education, Balance training, Gait training, Patient/Family education, Joint manipulation, Joint mobilization, Stair training, Orthotic/Fit training, DME instructions, Aquatic Therapy, Dry Needling, Electrical stimulation, Spinal manipulation, Spinal mobilization, Cryotherapy, Moist heat, Compression bandaging, scar mobilization, Splintting, Taping, Traction, Ultrasound, Ionotophoresis '4mg'$ /ml Dexamethasone, and Manual therapy  PLAN FOR NEXT SESSION:   progress lower extremity strengthening and balance as able. Flexion based  activity; spinal decompression   12:03 PM, 03/12/22 Orlean Holtrop Small Keilan Nichol MPT [facility] [place] [phone]

## 2022-03-18 DIAGNOSIS — I4891 Unspecified atrial fibrillation: Secondary | ICD-10-CM | POA: Diagnosis not present

## 2022-03-18 DIAGNOSIS — J9621 Acute and chronic respiratory failure with hypoxia: Secondary | ICD-10-CM | POA: Diagnosis not present

## 2022-03-19 ENCOUNTER — Ambulatory Visit (HOSPITAL_COMMUNITY): Payer: Medicare HMO

## 2022-03-19 DIAGNOSIS — M25551 Pain in right hip: Secondary | ICD-10-CM

## 2022-03-19 DIAGNOSIS — R29898 Other symptoms and signs involving the musculoskeletal system: Secondary | ICD-10-CM

## 2022-03-19 DIAGNOSIS — R262 Difficulty in walking, not elsewhere classified: Secondary | ICD-10-CM

## 2022-03-19 NOTE — Therapy (Signed)
OUTPATIENT PHYSICAL THERAPY NEURO TREATMENT   Patient Name: Caleb Wolf MRN: [id number] DOB:August 04, 1940, 81 y.o., male Today's Date: 03/19/2022   PCP: Asencion Noble, MD  REFERRING PROVIDER: Asencion Noble, MD    PT End of Session - 03/19/22 1114     Visit Number 4    Number of Visits 8    Date for PT Re-Evaluation 04/03/22    Authorization Type [employer] HMO    Authorization Time Period no VL, no co insurance, no auth, copay $35    PT Start Time 1110    PT Stop Time 1150    PT Time Calculation (min) 40 min                Past Medical History:  Diagnosis Date   AAA (abdominal aortic aneurysm) ([place])    Arthritis    Basal cell carcinoma    Carotid stenosis    Chronic kidney disease, stage 3b ([place]) 12/13/2021   Coronary artery disease    Multvessel s/p CABG 2015   Enlarged prostate    Essential hypertension    GERD (gastroesophageal reflux disease)    Gout    History of colon polyps    History of kidney stones    Hyperlipidemia    Lumbar disc disease    Persistent atrial fibrillation ([place])    Sleep apnea    CPAP   Past Surgical History:  Procedure Laterality Date   ABLATION OF DYSRHYTHMIC FOCUS  07/28/2017   ATRIAL FIBRILLATION ABLATION N/A 07/28/2017   Procedure: ATRIAL FIBRILLATION ABLATION;  Surgeon: Thompson Grayer, MD;  Location: [facility];  Service: Cardiovascular;  Laterality: N/A;   ATRIAL FIBRILLATION ABLATION N/A 12/04/2020   Procedure: ATRIAL FIBRILLATION ABLATION;  Surgeon: Thompson Grayer, MD;  Location: [facility];  Service: Cardiovascular;  Laterality: N/A;   BACK SURGERY     CARDIAC CATHETERIZATION  05/22/2014   Procedure: IABP INSERTION;  Surgeon: Leonie Man, MD;  Location: [facility];  Service: Cardiovascular;;   CARDIOVERSION N/A 04/29/2017   Procedure: CARDIOVERSION;  Surgeon: Satira Sark, MD;  [facility];  Service: Cardiovascular;  Laterality: N/A;   CARDIOVERSION N/A 08/13/2017   Procedure:  CARDIOVERSION;  Surgeon: Pixie Casino, MD;  Location: [place];  Service: Cardiovascular;  Laterality: N/A;   CARDIOVERSION N/A 08/15/2019   Procedure: CARDIOVERSION;  Surgeon: Dorothy Spark, MD;  Location: [facility] ENDOSCOPY;  Service: Cardiovascular;  Laterality: N/A;   CARDIOVERSION N/A 03/09/2020   Procedure: CARDIOVERSION;  Surgeon: Buford Dresser, MD;  Location: [place];  Service: Cardiovascular;  Laterality: N/A;   CARDIOVERSION N/A 07/06/2020   Procedure: CARDIOVERSION;  Surgeon: Sanda Klein, MD;  Location: [place];  Service: Cardiovascular;  Laterality: N/A;   CARDIOVERSION N/A 01/18/2021   Procedure: CARDIOVERSION;  Surgeon: Skeet Latch, MD;  Location: [facility] ENDOSCOPY;  Service: Cardiovascular;  Laterality: N/A;   CARDIOVERSION N/A 06/17/2021   Procedure: CARDIOVERSION;  Surgeon: Geralynn Rile, MD;  Location: [place];  Service: Cardiovascular;  Laterality: N/A;   Cataract surgery Right    COLONOSCOPY     COLONOSCOPY N/A 12/30/2017   Procedure: COLONOSCOPY;  Surgeon: Rogene [place], MD;  [facility];  Service: Endoscopy;  Laterality: N/A;   CORONARY ARTERY BYPASS GRAFT N/A 05/22/2014   Procedure: CORONARY ARTERY BYPASS GRAFTING (CABG) times three using left internal mammary and right saphenous vein.;  Surgeon: Melrose Nakayama, MD;  Location: [place];  Service: Open Heart Surgery;  Laterality: N/A;  ENDARTERECTOMY Left 02/17/2014   Procedure: ENDARTERECTOMY CAROTID WITH PATCH ANGIOPLASTY;  Surgeon: Mal Misty, MD;  Location: [place];  Service: Vascular;  Laterality: Left;   ESOPHAGEAL DILATION N/A 08/22/2015   Procedure: ESOPHAGEAL DILATION;  Surgeon: Rogene [place], MD;  [facility];  Service: Endoscopy;  Laterality: N/A;   ESOPHAGOGASTRODUODENOSCOPY N/A 08/22/2015   Procedure: ESOPHAGOGASTRODUODENOSCOPY (EGD);  Surgeon: Rogene [place], MD;  [facility];  Service: Endoscopy;  Laterality: N/A;  12:45 -  moved to 1:55 - Ann notified pt   ESOPHAGOGASTRODUODENOSCOPY N/A 12/30/2017   Procedure: ESOPHAGOGASTRODUODENOSCOPY (EGD);  Surgeon: Rogene [place], MD;  [facility];  Service: Endoscopy;  Laterality: N/A;  200   EYE SURGERY     cataract extraction (right) with repair macular tear , with IOL     right   FRACTURE SURGERY     bilateral wrist fractures- one ORIF   JOINT REPLACEMENT  2011   left knee   LEFT HEART CATHETERIZATION WITH CORONARY ANGIOGRAM N/A 05/22/2014   Procedure: LEFT HEART CATHETERIZATION WITH CORONARY ANGIOGRAM;  Surgeon: Leonie Man, MD;  Location: [facility] CATH LAB;  Service: Cardiovascular;  Laterality: N/A;   LITHOTRIPSY     PARS PLANA VITRECTOMY W/ REPAIR OF MACULAR HOLE     RHINOPLASTY     TEE WITHOUT CARDIOVERSION N/A 04/29/2017   Procedure: TRANSESOPHAGEAL ECHOCARDIOGRAM (TEE) WITH PROPOFOL;  Surgeon: Satira Sark, MD;  [facility];  Service: Cardiovascular;  Laterality: N/A;   TONSILLECTOMY     TOTAL HIP ARTHROPLASTY  12/08/2011   Procedure: TOTAL HIP ARTHROPLASTY;  Surgeon: Gearlean Alf, MD;  Location: [facility];  Service: Orthopedics;  Laterality: Right;   UPPER GI ENDOSCOPY  12/18/2015   Procedure: UPPER GI ENDOSCOPY;  Surgeon: Ralene Ok, MD;  Location: [facility];  Service: General;;   WRIST SURGERY Right 75yr ago   WRIST SURGERY Left    Patient Active Problem List   Diagnosis Date Noted   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   Ground glass opacity present on imaging of lung 12/31/2021   Chronic kidney disease, stage 3b ([place] 12/13/2021   Hemoptysis 12/13/2021   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) ([place] 12/13/2021   CAD (coronary artery disease) 12/10/2021   Acute respiratory failure with hypoxia ([facility] 12/10/2021   Posterior vitreous detachment of left eye 02/13/2020   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   Vitreous floaters, left 02/13/2020   History of vitrectomy 02/13/2020   Left epiretinal membrane 02/13/2020    HLD (hyperlipidemia) 12/07/2019   Low back pain 11/15/2019   Lumbar post-laminectomy syndrome 11/15/2019   Spinal stenosis of lumbar region 11/15/2019   Pain in joint of right hip 06/09/2019   Trigger finger 04/08/2019   Special screening for malignant neoplasms, colon 11/25/2017   Absolute anemia 11/25/2017   Persistent atrial fibrillation ([facility] 07/28/2017   GERD (gastroesophageal reflux disease) 04/28/2017   AKI (acute kidney injury) ([facility] 04/28/2017   Achalasia 12/18/2015   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   Encounter for therapeutic drug monitoring 05/31/2014   Atrial fibrillation with rapid ventricular response ([facility] 05/22/2014   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   Abnormal EKG    NSTEMI (non-ST elevated myocardial infarction) ([facility]    Carotid artery stenosis without cerebral infarction    Benign non-nodular prostatic hyperplasia with lower urinary tract symptoms 03/27/2014   Chest pain 02/09/2014   HTN (hypertension) 02/09/2014   Carotid stenosis 08/02/2013   Peripheral edema 08/02/2013   Urge incontinence  of urine 10/04/2012   OSA (obstructive sleep apnea) 09/21/2012   Recurrent nephrolithiasis 04/05/2012   Difficulty walking 01/29/2012   Balance disorder 01/29/2012   OA (osteoarthritis) of hip 12/08/2011   History of revision of total replacement of right hip joint 12/08/2011   ED (erectile dysfunction) of organic origin 04/07/2011   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   History of total knee replacement, left 09/10/2009    ONSET DATE: July 2023 had pneumonia  REFERRING DIAG: PT eval/tx for gait and balance per Asencion Noble, MD   THERAPY DIAG:  Difficulty in walking, not elsewhere classified  Other symptoms and signs involving the musculoskeletal system  Pain in right hip  Rationale for Evaluation and Treatment Rehabilitation  SUBJECTIVE:                                                                                                                                                                                               SUBJECTIVE STATEMENT: Continues to report increased pain with standing and walking; 9/10 with standing more than 5 minutes.  Eval:" My balance is not good"; reports increased weakness since hospitalization in July for pneumonia.  Had a fall right after d/c from hospital and landed on Right hip. Currently holding fluid  Pt accompanied by: significant other Catalina Lunger  PERTINENT HISTORY:  CHF R THA L TKA Spinal stenosis MI  PAIN:  Are you having pain? Yes: NPRS scale: 06/10 Pain location: Right hip Pain description: sore, achy Aggravating factors: standing, walking Relieving factors: sitting, resting, tylenol  PRECAUTIONS: Fall  WEIGHT BEARING RESTRICTIONS No  FALLS: Has patient fallen in last 6 months? Yes. Number of falls 1  LIVING ENVIRONMENT: Lives with: lives with their spouse Lives in: House/apartment Stairs: No Has following equipment at home: Single point cane, Environmental consultant - 2 wheeled, Environmental consultant - 4 wheeled, shower chair, Grab bars, and None  PLOF: Independent with household mobility with device  PATIENT GOALS ease pain in my hip  OBJECTIVE:   DIAGNOSTIC FINDINGS: none recent  COGNITION: Overall cognitive status: Within functional limits for tasks assessed   SENSATION: WFL   EDEMA: bilateral legs wearing compression stockings       LOWER EXTREMITY MMT:    MMT Right Eval Left Eval  Hip flexion 4- 4  Hip extension 4- 4  Hip abduction 4- 4  Hip adduction    Hip internal rotation    Hip external rotation    Knee flexion    Knee extension 4+ 5  Ankle dorsiflexion 4- 5  Ankle plantarflexion    Ankle inversion    Ankle eversion    (Blank rows =  not tested)     GAIT: Gait pattern: decreased stance time- Right and decreased hip/knee flexion- Right Distance walked: 278 ft Assistive device utilized: Single point cane Level of assistance: SBA Comments: wearing 2 L of O2  FUNCTIONAL TESTs:  5 times sit to  stand: 16 sec 2 minute walk test: 278 ft  with SPC and 2L of O2  SLS Right 1 sec, Left 4 sec  PATIENT SURVEYS:  FOTO 50  TODAY'S TREATMENT:  03/19/22 Supine: LTR x 10 SKTC with towel 10" x 10 Hooklying Hip adduction with ball 5" x 10 Hooklying Hip abduction with belt 5" x 10 SAQ's 2# 2 x 10 each leg  Sidelying Right hip clam x 10 (painful)  Sitting: LAQ 2 x 10 each  Sit to stand 2 x 5; no UE assist      03/12/22 Supine: SKTC 3 x 20" LTR x 10 Hip adduction 5" x 10 Hip abduction 5" x 10 Bridge (painful) Manual hip distraction right x 8 TA bracing 5" hold x 8  Standing: Lumbar extensions x 10 (painful) Lateral hip glides x 10 Heel raises/ toe raises 2 x 10  Seated Repeated lumbar flexion x 8     03/10/22 Review of HEP and goals Hip extension and abduction strength testing  Supine: Bridge x 8 LTR x 10 Hip adduction with ball 5" x 8 Hip abduction with GTB x 10  Sit to stand 3 x 5 from mat table without UE assist  Standing: // bars Tandem stance 2 x 30" each SLS 3 x 10" each 4" toe taps x 10 4" step ups x 10 each  O2 sat remained at 90% and above without oxygen on throughout treatment    Physical therapy evaluation and HEP instruction   PATIENT EDUCATION:  Education details: Patient educated on exam findings, POC, scope of PT, HEP. Person educated: Patient Education method: Explanation, Demonstration, and Handouts Education comprehension: verbalized understanding, returned demonstration, verbal cues required, and tactile cues required    HOME EXERCISE PROGRAM: Access Code: [email or web address] URL: [email or web address] Date: 03/12/2022 Prepared by: [facility]  Exercises - Sit to Stand with Counter Support  - 3 x daily - 7 x weekly - 1 sets - 10 reps - Standing Single Leg Stance with Counter Support  - 2 x daily - 7 x weekly - 1 sets - 3 reps - 10" hold - Standing Tandem Balance with Counter Support  - 2 x daily - 7 x weekly - 1  sets - 10 reps - 10" hold - Supine Bridge  - 2 x daily - 7 x weekly - 1 sets - 10 reps - Supine Lower Trunk Rotation  - 2 x daily - 7 x weekly - 1 sets - 10 reps - Supine Hip Adduction Isometric with Ball  - 2 x daily - 7 x weekly - 1 sets - 10 reps - Hooklying Clamshell with Resistance  - 2 x daily - 7 x weekly - 1 sets - 10 reps - Seated Flexion Stretch  - 2 x daily - 7 x weekly - 1 sets - 10 reps - 2 sec hold - Hooklying Single Knee to Chest Stretch  - 2 x daily - 7 x weekly - 1 sets - 10 reps - 10 sec hold  Access Code: [email or web address] URL: [email or web address] Date: 03/06/2022 Prepared by: [facility]  Exercises - Sit to Stand with Counter Support  - 3 x daily - 7 x weekly - 1 sets - 10  reps - Standing Single Leg Stance with Counter Support  - 1 x daily - 7 x weekly - 1 sets - 3 reps - 10" hold - Standing Tandem Balance with Counter Support  - 1 x daily - 7 x weekly - 1 sets - 10 reps - 10" hold    GOALS: Goals reviewed with patient? Yes  SHORT TERM GOALS: Target date: 03/20/2022   patient will be independent with initial HEP Baseline: Goal status: IN PROGRESS  2.  Patient will be able to stand 5 sec on each leg with SLS to improve functional balance and demonstrate decreased fall risk.  Baseline:  Goal status: IN PROGRESS   LONG TERM GOALS: Target date: 04/03/2022   Patient will be independent with advanced HEP and self management strategies to improve quality of life and functional outcomes.  Baseline:  Goal status: IN PROGRESS  2.  Patient will report at least 50% improvement in overall symptoms and/or function to demonstrate improved functional mobility  Baseline:  Goal status: IN PROGRESS  3.  Patient will be able to stand 10 sec on each leg with SLS to improve functional balance and demonstrate decreased fall risk.  Baseline:  Goal status: IN PROGRESS  4.  Patient will improve 5 x STS score from 16 sec to 13 sec to demonstrate improved functional  mobility and increased lower extremity strength.  Baseline:  Goal status: IN PROGRESS  5.  Patient will increase 2 MWT to 300 ft to demonstrate improved functional mobility. Baseline: 278 ft Goal status: IN PROGRESS   ASSESSMENT:  CLINICAL IMPRESSION: Patient reports increased pain since start of therapy.  Discussed with patient that an x-ray would be beneficial to rule out any type of fracture from a fall in July.  This PT sent Dr. Willey Blade a message and asked patient to call and follow up.  Patient with pain with all right lower extremity movement today.  Patient will benefit from continued skilled therapy services  to address deficits and promote return to optimal function.       OBJECTIVE IMPAIRMENTS Abnormal gait, cardiopulmonary status limiting activity, decreased activity tolerance, decreased balance, decreased endurance, decreased mobility, difficulty walking, decreased ROM, decreased strength, hypomobility, increased edema, increased fascial restrictions, impaired perceived functional ability, impaired flexibility, and pain.   ACTIVITY LIMITATIONS carrying, lifting, bending, standing, squatting, sleeping, stairs, transfers, locomotion level, and caring for others  PARTICIPATION LIMITATIONS: meal prep, cleaning, laundry, driving, shopping, community activity, and yard work  PERSONAL FACTORS 1-2 comorbidities: CHF, HBP  are also affecting patient's functional outcome.   REHAB POTENTIAL: Good  CLINICAL DECISION MAKING: Evolving/moderate complexity  EVALUATION COMPLEXITY: Moderate  PLAN: PT FREQUENCY: 2x/week  PT DURATION: 4 weeks  PLANNED INTERVENTIONS: Therapeutic exercises, Therapeutic activity, Neuromuscular re-education, Balance training, Gait training, Patient/Family education, Joint manipulation, Joint mobilization, Stair training, Orthotic/Fit training, DME instructions, Aquatic Therapy, Dry Needling, Electrical stimulation, Spinal manipulation, Spinal mobilization,  Cryotherapy, Moist heat, Compression bandaging, scar mobilization, Splintting, Taping, Traction, Ultrasound, Ionotophoresis '4mg'$ /ml Dexamethasone, and Manual therapy  PLAN FOR NEXT SESSION:   progress lower extremity strengthening and balance as able. Flexion based activity; spinal decompression; sent a message to Dr. Willey Blade asking about possible x-ray?   11:48 AM, 03/19/22 Jagger Demonte Small Lanecia Sliva MPT [facility] [place] [phone]

## 2022-03-20 ENCOUNTER — Ambulatory Visit (HOSPITAL_COMMUNITY)
Admission: RE | Admit: 2022-03-20 | Discharge: 2022-03-20 | Disposition: A | Discharge status: Home / Self Care | Payer: Medicare HMO | Source: Ambulatory Visit | Attending: Internal Medicine | Admitting: Internal Medicine

## 2022-03-20 ENCOUNTER — Other Ambulatory Visit (HOSPITAL_COMMUNITY): Payer: Self-pay | Admitting: Internal Medicine

## 2022-03-20 DIAGNOSIS — M25551 Pain in right hip: Secondary | ICD-10-CM | POA: Diagnosis not present

## 2022-03-20 DIAGNOSIS — R102 Pelvic and perineal pain: Secondary | ICD-10-CM | POA: Diagnosis not present

## 2022-03-21 ENCOUNTER — Encounter (HOSPITAL_COMMUNITY): Payer: Self-pay

## 2022-03-21 ENCOUNTER — Other Ambulatory Visit (HOSPITAL_COMMUNITY)
Admission: RE | Admit: 2022-03-21 | Discharge: 2022-03-21 | Disposition: A | Discharge status: Home / Self Care | Payer: Medicare HMO | Source: Ambulatory Visit | Attending: Student | Admitting: Student

## 2022-03-21 ENCOUNTER — Ambulatory Visit (HOSPITAL_COMMUNITY): Payer: Medicare HMO

## 2022-03-21 DIAGNOSIS — R29898 Other symptoms and signs involving the musculoskeletal system: Secondary | ICD-10-CM | POA: Diagnosis not present

## 2022-03-21 DIAGNOSIS — R262 Difficulty in walking, not elsewhere classified: Secondary | ICD-10-CM | POA: Diagnosis not present

## 2022-03-21 DIAGNOSIS — M25551 Pain in right hip: Secondary | ICD-10-CM

## 2022-03-21 DIAGNOSIS — I5020 Unspecified systolic (congestive) heart failure: Secondary | ICD-10-CM | POA: Diagnosis not present

## 2022-03-21 LAB — BASIC METABOLIC PANEL
Anion gap: 11 (ref 5–15)
BUN: 24 mg/dL — ABNORMAL HIGH (ref 8–23)
CO2: 25 mmol/L (ref 22–32)
Calcium: 8.6 mg/dL — ABNORMAL LOW (ref 8.9–10.3)
Chloride: 106 mmol/L (ref 98–111)
Creatinine, Ser: 1.96 mg/dL — ABNORMAL HIGH (ref 0.61–1.24)
GFR, Estimated: 34 mL/min — ABNORMAL LOW (ref >60)
Glucose, Bld: 101 mg/dL — ABNORMAL HIGH (ref 70–99)
Potassium: 4.7 mmol/L (ref 3.5–5.1)
Sodium: 142 mmol/L (ref 135–145)

## 2022-03-21 LAB — BRAIN NATRIURETIC PEPTIDE: B Natriuretic Peptide: 847 pg/mL — ABNORMAL HIGH (ref 0.0–100.0)

## 2022-03-21 NOTE — Therapy (Signed)
OUTPATIENT PHYSICAL THERAPY NEURO TREATMENT   Patient Name: Caleb Wolf MRN: [id number] DOB:10-Aug-1940, 81 y.o., male Today's Date: 03/21/2022   PCP: Asencion Noble, MD  REFERRING PROVIDER: Asencion Noble, MD    PT End of Session - 03/21/22 1305     Visit Number 5    Number of Visits 8    Date for PT Re-Evaluation 04/03/22    Authorization Type [employer] HMO    Authorization Time Period no VL, no co insurance, no auth, copay $35    PT Start Time 1304    PT Stop Time 1343    PT Time Calculation (min) 39 min    Activity Tolerance Patient limited by pain;Patient tolerated treatment well;No increased pain    Behavior During Therapy [facility] for tasks assessed/performed                Past Medical History:  Diagnosis Date   AAA (abdominal aortic aneurysm) (HCC)    Arthritis    Basal cell carcinoma    Carotid stenosis    Chronic kidney disease, stage 3b ([place]) 12/13/2021   Coronary artery disease    Multvessel s/p CABG 2015   Enlarged prostate    Essential hypertension    GERD (gastroesophageal reflux disease)    Gout    History of colon polyps    History of kidney stones    Hyperlipidemia    Lumbar disc disease    Persistent atrial fibrillation (HCC)    Sleep apnea    CPAP   Past Surgical History:  Procedure Laterality Date   ABLATION OF DYSRHYTHMIC FOCUS  07/28/2017   ATRIAL FIBRILLATION ABLATION N/A 07/28/2017   Procedure: ATRIAL FIBRILLATION ABLATION;  Surgeon: Thompson Grayer, MD;  Location: [place] CV [facility];  Service: Cardiovascular;  Laterality: N/A;   ATRIAL FIBRILLATION ABLATION N/A 12/04/2020   Procedure: ATRIAL FIBRILLATION ABLATION;  Surgeon: Thompson Grayer, MD;  Location: [place] [facility];  Service: Cardiovascular;  Laterality: N/A;   BACK SURGERY     CARDIAC CATHETERIZATION  05/22/2014   Procedure: IABP INSERTION;  Surgeon: Leonie Man, MD;  Location: [facility] CATH LAB;  Service: Cardiovascular;;   CARDIOVERSION N/A 04/29/2017   Procedure: CARDIOVERSION;   Surgeon: Satira Sark, MD;  [facility];  Service: Cardiovascular;  Laterality: N/A;   CARDIOVERSION N/A 08/13/2017   Procedure: CARDIOVERSION;  Surgeon: Pixie Casino, MD;  Location: [place];  Service: Cardiovascular;  Laterality: N/A;   CARDIOVERSION N/A 08/15/2019   Procedure: CARDIOVERSION;  Surgeon: Dorothy Spark, MD;  Location: [facility] ENDOSCOPY;  Service: Cardiovascular;  Laterality: N/A;   CARDIOVERSION N/A 03/09/2020   Procedure: CARDIOVERSION;  Surgeon: Buford Dresser, MD;  Location: [place];  Service: Cardiovascular;  Laterality: N/A;   CARDIOVERSION N/A 07/06/2020   Procedure: CARDIOVERSION;  Surgeon: Sanda Klein, MD;  Location: [place];  Service: Cardiovascular;  Laterality: N/A;   CARDIOVERSION N/A 01/18/2021   Procedure: CARDIOVERSION;  Surgeon: Skeet Latch, MD;  Location: [facility] ENDOSCOPY;  Service: Cardiovascular;  Laterality: N/A;   CARDIOVERSION N/A 06/17/2021   Procedure: CARDIOVERSION;  Surgeon: Geralynn Rile, MD;  Location: [place];  Service: Cardiovascular;  Laterality: N/A;   Cataract surgery Right    COLONOSCOPY     COLONOSCOPY N/A 12/30/2017   Procedure: COLONOSCOPY;  Surgeon: Rogene Houston, MD;  [facility];  Service: Endoscopy;  Laterality: N/A;   CORONARY ARTERY BYPASS GRAFT N/A 05/22/2014   Procedure: CORONARY ARTERY BYPASS GRAFTING (CABG) times three using left internal  mammary and right saphenous vein.;  Surgeon: Melrose Nakayama, MD;  Location: [place];  Service: Open Heart Surgery;  Laterality: N/A;   ENDARTERECTOMY Left 02/17/2014   Procedure: ENDARTERECTOMY CAROTID WITH PATCH ANGIOPLASTY;  Surgeon: Mal Misty, MD;  Location: [place];  Service: Vascular;  Laterality: Left;   ESOPHAGEAL DILATION N/A 08/22/2015   Procedure: ESOPHAGEAL DILATION;  Surgeon: Rogene Houston, MD;  [facility];  Service: Endoscopy;  Laterality: N/A;   ESOPHAGOGASTRODUODENOSCOPY N/A 08/22/2015    Procedure: ESOPHAGOGASTRODUODENOSCOPY (EGD);  Surgeon: Rogene Houston, MD;  [facility];  Service: Endoscopy;  Laterality: N/A;  12:45 - moved to 1:55 - Ann notified pt   ESOPHAGOGASTRODUODENOSCOPY N/A 12/30/2017   Procedure: ESOPHAGOGASTRODUODENOSCOPY (EGD);  Surgeon: Rogene Houston, MD;  [facility];  Service: Endoscopy;  Laterality: N/A;  200   EYE SURGERY     cataract extraction (right) with repair macular tear , with IOL     right   FRACTURE SURGERY     bilateral wrist fractures- one ORIF   JOINT REPLACEMENT  2011   left knee   LEFT HEART CATHETERIZATION WITH CORONARY ANGIOGRAM N/A 05/22/2014   Procedure: LEFT HEART CATHETERIZATION WITH CORONARY ANGIOGRAM;  Surgeon: Leonie Man, MD;  Location: [facility] CATH LAB;  Service: Cardiovascular;  Laterality: N/A;   LITHOTRIPSY     PARS PLANA VITRECTOMY W/ REPAIR OF MACULAR HOLE     RHINOPLASTY     TEE WITHOUT CARDIOVERSION N/A 04/29/2017   Procedure: TRANSESOPHAGEAL ECHOCARDIOGRAM (TEE) WITH PROPOFOL;  Surgeon: Satira Sark, MD;  [facility];  Service: Cardiovascular;  Laterality: N/A;   TONSILLECTOMY     TOTAL HIP ARTHROPLASTY  12/08/2011   Procedure: TOTAL HIP ARTHROPLASTY;  Surgeon: Gearlean Alf, MD;  Location: [facility];  Service: Orthopedics;  Laterality: Right;   UPPER GI ENDOSCOPY  12/18/2015   Procedure: UPPER GI ENDOSCOPY;  Surgeon: Ralene Ok, MD;  Location: [facility];  Service: General;;   WRIST SURGERY Right 63yr ago   WRIST SURGERY Left    Patient Active Problem List   Diagnosis Date Noted   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   Ground glass opacity present on imaging of lung 12/31/2021   Chronic kidney disease, stage 3b ([place] 12/13/2021   Hemoptysis 12/13/2021   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) ([place] 12/13/2021   CAD (coronary artery disease) 12/10/2021   Acute respiratory failure with hypoxia ([place] 12/10/2021   Posterior vitreous detachment of left eye 02/13/2020    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   Vitreous floaters, left 02/13/2020   History of vitrectomy 02/13/2020   Left epiretinal membrane 02/13/2020   HLD (hyperlipidemia) 12/07/2019   Low back pain 11/15/2019   Lumbar post-laminectomy syndrome 11/15/2019   Spinal stenosis of lumbar region 11/15/2019   Pain in joint of right hip 06/09/2019   Trigger finger 04/08/2019   Special screening for malignant neoplasms, colon 11/25/2017   Absolute anemia 11/25/2017   Persistent atrial fibrillation ([place] 07/28/2017   GERD (gastroesophageal reflux disease) 04/28/2017   AKI (acute kidney injury) ([place] 04/28/2017   Achalasia 12/18/2015   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   Encounter for therapeutic drug monitoring 05/31/2014   Atrial fibrillation with rapid ventricular response ([place] 05/22/2014   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   Abnormal EKG    NSTEMI (non-ST elevated myocardial infarction) ([place]    Carotid artery stenosis without cerebral infarction    Benign non-nodular prostatic hyperplasia with lower urinary tract symptoms  03/27/2014   Chest pain 02/09/2014   HTN (hypertension) 02/09/2014   Carotid stenosis 08/02/2013   Peripheral edema 08/02/2013   Urge incontinence of urine 10/04/2012   OSA (obstructive sleep apnea) 09/21/2012   Recurrent nephrolithiasis 04/05/2012   Difficulty walking 01/29/2012   Balance disorder 01/29/2012   OA (osteoarthritis) of hip 12/08/2011   History of revision of total replacement of right hip joint 12/08/2011   ED (erectile dysfunction) of organic origin 04/07/2011   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   History of total knee replacement, left 09/10/2009    ONSET DATE: July 2023 had pneumonia  REFERRING DIAG: PT eval/tx for gait and balance per Asencion Noble, MD   THERAPY DIAG:  Difficulty in walking, not elsewhere classified  Other symptoms and signs involving the musculoskeletal system  Pain in right hip  Rationale for Evaluation and Treatment  Rehabilitation  SUBJECTIVE:                                                                                                                                                                                              SUBJECTIVE STATEMENT: Reports increased pain with standing to 5/10 Rt hip.  Reports he had xrays complete yesterday, hasn't gotten feed back following the xrays.  Reports he gave blood earlier today.    Eval:" My balance is not good"; reports increased weakness since hospitalization in July for pneumonia.  Had a fall right after d/c from hospital and landed on Right hip. Currently holding fluid  Pt accompanied by: significant other Catalina Lunger  PERTINENT HISTORY:  CHF R THA L TKA Spinal stenosis MI  PAIN:  Are you having pain? Yes: NPRS scale: 05/10 Pain location: Right hip Pain description: sore, achy Aggravating factors: standing, walking Relieving factors: sitting, resting, tylenol  PRECAUTIONS: Fall  WEIGHT BEARING RESTRICTIONS No  FALLS: Has patient fallen in last 6 months? Yes. Number of falls 1  LIVING ENVIRONMENT: Lives with: lives with their spouse Lives in: House/apartment Stairs: No Has following equipment at home: Single point cane, Environmental consultant - 2 wheeled, Environmental consultant - 4 wheeled, shower chair, Grab bars, and None  PLOF: Independent with household mobility with device  PATIENT GOALS ease pain in my hip  OBJECTIVE:   DIAGNOSTIC FINDINGS: none recent  COGNITION: Overall cognitive status: Within functional limits for tasks assessed   SENSATION: WFL   EDEMA: bilateral legs wearing compression stockings       LOWER EXTREMITY MMT:    MMT Right Eval Left Eval  Hip flexion 4- 4  Hip extension 4- 4  Hip abduction 4- 4  Hip adduction    Hip internal rotation  Hip external rotation    Knee flexion    Knee extension 4+ 5  Ankle dorsiflexion 4- 5  Ankle plantarflexion    Ankle inversion    Ankle eversion    (Blank rows = not  tested)     GAIT: Gait pattern: decreased stance time- Right and decreased hip/knee flexion- Right Distance walked: 278 ft Assistive device utilized: Single point cane Level of assistance: SBA Comments: wearing 2 L of O2  FUNCTIONAL TESTs:  5 times sit to stand: 16 sec 2 minute walk test: 278 ft  with SPC and 2L of O2  SLS Right 1 sec, Left 4 sec  PATIENT SURVEYS:  FOTO 50  TODAY'S TREATMENT:  03/21/22 Seated on BOSU cone reach forward and rotation STS 2x 5 no HHA eccentric control Standing: Heel raise 10 Tandem stance 2x 30" Sidelying: abduction Supine: bridges  03/19/22 Supine: LTR x 10 SKTC with towel 10" x 10 Hooklying Hip adduction with ball 5" x 10 Hooklying Hip abduction with belt 5" x 10 SAQ's 2# 2 x 10 each leg  Sidelying Right hip clam x 10 (painful)  Sitting: LAQ 2 x 10 each  Sit to stand 2 x 5; no UE assist      03/12/22 Supine: SKTC 3 x 20" LTR x 10 Hip adduction 5" x 10 Hip abduction 5" x 10 Bridge (painful) Manual hip distraction right x 8 TA bracing 5" hold x 8  Standing: Lumbar extensions x 10 (painful) Lateral hip glides x 10 Heel raises/ toe raises 2 x 10  Seated Repeated lumbar flexion x 8     03/10/22 Review of HEP and goals Hip extension and abduction strength testing  Supine: Bridge x 8 LTR x 10 Hip adduction with ball 5" x 8 Hip abduction with GTB x 10  Sit to stand 3 x 5 from mat table without UE assist  Standing: // bars Tandem stance 2 x 30" each SLS 3 x 10" each 4" toe taps x 10 4" step ups x 10 each  O2 sat remained at 90% and above without oxygen on throughout treatment    Physical therapy evaluation and HEP instruction   PATIENT EDUCATION:  Education details: Patient educated on exam findings, POC, scope of PT, HEP. Person educated: Patient Education method: Explanation, Demonstration, and Handouts Education comprehension: verbalized understanding, returned demonstration, verbal cues  required, and tactile cues required    HOME EXERCISE PROGRAM: Access Code: [email or web address] URL: [email or web address] Date: 03/12/2022 Prepared by: [facility]  Exercises - Sit to Stand with Counter Support  - 3 x daily - 7 x weekly - 1 sets - 10 reps - Standing Single Leg Stance with Counter Support  - 2 x daily - 7 x weekly - 1 sets - 3 reps - 10" hold - Standing Tandem Balance with Counter Support  - 2 x daily - 7 x weekly - 1 sets - 10 reps - 10" hold - Supine Bridge  - 2 x daily - 7 x weekly - 1 sets - 10 reps - Supine Lower Trunk Rotation  - 2 x daily - 7 x weekly - 1 sets - 10 reps - Supine Hip Adduction Isometric with Ball  - 2 x daily - 7 x weekly - 1 sets - 10 reps - Hooklying Clamshell with Resistance  - 2 x daily - 7 x weekly - 1 sets - 10 reps - Seated Flexion Stretch  - 2 x daily - 7 x weekly - 1 sets -  10 reps - 2 sec hold - Hooklying Single Knee to Chest Stretch  - 2 x daily - 7 x weekly - 1 sets - 10 reps - 10 sec hold  Access Code: [email or web address] URL: [email or web address] Date: 03/06/2022 Prepared by: [facility]  Exercises - Sit to Stand with Counter Support  - 3 x daily - 7 x weekly - 1 sets - 10 reps - Standing Single Leg Stance with Counter Support  - 1 x daily - 7 x weekly - 1 sets - 3 reps - 10" hold - Standing Tandem Balance with Counter Support  - 1 x daily - 7 x weekly - 1 sets - 10 reps - 10" hold    GOALS: Goals reviewed with patient? Yes  SHORT TERM GOALS: Target date: 03/20/2022   patient will be independent with initial HEP Baseline: Goal status: IN PROGRESS  2.  Patient will be able to stand 5 sec on each leg with SLS to improve functional balance and demonstrate decreased fall risk.  Baseline:  Goal status: IN PROGRESS   LONG TERM GOALS: Target date: 04/03/2022   Patient will be independent with advanced HEP and self management strategies to improve quality of life and functional outcomes.  Baseline:  Goal status: IN  PROGRESS  2.  Patient will report at least 50% improvement in overall symptoms and/or function to demonstrate improved functional mobility  Baseline:  Goal status: IN PROGRESS  3.  Patient will be able to stand 10 sec on each leg with SLS to improve functional balance and demonstrate decreased fall risk.  Baseline:  Goal status: IN PROGRESS  4.  Patient will improve 5 x STS score from 16 sec to 13 sec to demonstrate improved functional mobility and increased lower extremity strength.  Baseline:  Goal status: IN PROGRESS  5.  Patient will increase 2 MWT to 300 ft to demonstrate improved functional mobility. Baseline: 278 ft Goal status: IN PROGRESS   ASSESSMENT:  CLINICAL IMPRESSION: Pt continues to be limited Rt hip area, increases pain with all movements both standing and mat activities.  Monitored through session.  Added seated dynamic surface balance reaching exercises with SBA for safety.  Was able to resume standing exercises with UE support balance needed.  Pt encouraged to call MD to review xray results.      OBJECTIVE IMPAIRMENTS Abnormal gait, cardiopulmonary status limiting activity, decreased activity tolerance, decreased balance, decreased endurance, decreased mobility, difficulty walking, decreased ROM, decreased strength, hypomobility, increased edema, increased fascial restrictions, impaired perceived functional ability, impaired flexibility, and pain.   ACTIVITY LIMITATIONS carrying, lifting, bending, standing, squatting, sleeping, stairs, transfers, locomotion level, and caring for others  PARTICIPATION LIMITATIONS: meal prep, cleaning, laundry, driving, shopping, community activity, and yard work  PERSONAL FACTORS 1-2 comorbidities: CHF, HBP  are also affecting patient's functional outcome.   REHAB POTENTIAL: Good  CLINICAL DECISION MAKING: Evolving/moderate complexity  EVALUATION COMPLEXITY: Moderate  PLAN: PT FREQUENCY: 2x/week  PT DURATION: 4  weeks  PLANNED INTERVENTIONS: Therapeutic exercises, Therapeutic activity, Neuromuscular re-education, Balance training, Gait training, Patient/Family education, Joint manipulation, Joint mobilization, Stair training, Orthotic/Fit training, DME instructions, Aquatic Therapy, Dry Needling, Electrical stimulation, Spinal manipulation, Spinal mobilization, Cryotherapy, Moist heat, Compression bandaging, scar mobilization, Splintting, Taping, Traction, Ultrasound, Ionotophoresis '4mg'$ /ml Dexamethasone, and Manual therapy  PLAN FOR NEXT SESSION:   progress lower extremity strengthening and balance as able. Flexion based activity; spinal decompression; pt to follow up with xray results.  Ihor Austin, LPTA/CLT; CBIS [phone]  3:11 PM, 03/21/22

## 2022-03-25 ENCOUNTER — Telehealth: Payer: Self-pay | Admitting: Internal Medicine

## 2022-03-25 DIAGNOSIS — G4733 Obstructive sleep apnea (adult) (pediatric): Secondary | ICD-10-CM | POA: Diagnosis not present

## 2022-03-25 NOTE — Telephone Encounter (Signed)
Called patient and left voicemail that if he still has his ONO to call [employer] about pick up. Advised him to call office back.

## 2022-03-26 ENCOUNTER — Encounter (HOSPITAL_COMMUNITY): Payer: Self-pay

## 2022-03-26 ENCOUNTER — Telehealth: Payer: Self-pay | Admitting: *Deleted

## 2022-03-26 ENCOUNTER — Ambulatory Visit (HOSPITAL_COMMUNITY): Payer: Medicare HMO

## 2022-03-26 DIAGNOSIS — R29898 Other symptoms and signs involving the musculoskeletal system: Secondary | ICD-10-CM | POA: Diagnosis not present

## 2022-03-26 DIAGNOSIS — R262 Difficulty in walking, not elsewhere classified: Secondary | ICD-10-CM

## 2022-03-26 DIAGNOSIS — M25551 Pain in right hip: Secondary | ICD-10-CM | POA: Diagnosis not present

## 2022-03-26 MED ORDER — FUROSEMIDE 40 MG PO TABS: ORAL_TABLET | ORAL | 3 refills | Status: DC

## 2022-03-26 NOTE — Therapy (Signed)
OUTPATIENT PHYSICAL THERAPY NEURO TREATMENT   Patient Name: Caleb Wolf MRN: [id number] DOB:Sep 07, 1940, 81 y.o., male Today's Date: 03/26/2022   PCP: Asencion Noble, MD  REFERRING PROVIDER: Asencion Noble, MD    PT End of Session - 03/26/22 0915     Visit Number 6    Number of Visits 8    Date for PT Re-Evaluation 04/03/22    Authorization Type [employer] HMO    Authorization Time Period no VL, no co insurance, no auth, copay $35    PT Start Time 0906    PT Stop Time 0945    PT Time Calculation (min) 39 min    Equipment Utilized During Treatment Gait belt    Activity Tolerance Patient tolerated treatment well;Patient limited by fatigue;No increased pain    Behavior During Therapy [facility] for tasks assessed/performed                 Past Medical History:  Diagnosis Date   AAA (abdominal aortic aneurysm) (HCC)    Arthritis    Basal cell carcinoma    Carotid stenosis    Chronic kidney disease, stage 3b ([place]) 12/13/2021   Coronary artery disease    Multvessel s/p CABG 2015   Enlarged prostate    Essential hypertension    GERD (gastroesophageal reflux disease)    Gout    History of colon polyps    History of kidney stones    Hyperlipidemia    Lumbar disc disease    Persistent atrial fibrillation (HCC)    Sleep apnea    CPAP   Past Surgical History:  Procedure Laterality Date   ABLATION OF DYSRHYTHMIC FOCUS  07/28/2017   ATRIAL FIBRILLATION ABLATION N/A 07/28/2017   Procedure: ATRIAL FIBRILLATION ABLATION;  Surgeon: Thompson Grayer, MD;  Location: [place] CV LAB;  Service: Cardiovascular;  Laterality: N/A;   ATRIAL FIBRILLATION ABLATION N/A 12/04/2020   Procedure: ATRIAL FIBRILLATION ABLATION;  Surgeon: Thompson Grayer, MD;  Location: [place] CV LAB;  Service: Cardiovascular;  Laterality: N/A;   BACK SURGERY     CARDIAC CATHETERIZATION  05/22/2014   Procedure: IABP INSERTION;  Surgeon: Leonie Man, MD;  Location: [facility] CATH LAB;  Service: Cardiovascular;;    CARDIOVERSION N/A 04/29/2017   Procedure: CARDIOVERSION;  Surgeon: Satira Sark, MD;  [facility];  Service: Cardiovascular;  Laterality: N/A;   CARDIOVERSION N/A 08/13/2017   Procedure: CARDIOVERSION;  Surgeon: Pixie Casino, MD;  Location: [place];  Service: Cardiovascular;  Laterality: N/A;   CARDIOVERSION N/A 08/15/2019   Procedure: CARDIOVERSION;  Surgeon: Dorothy Spark, MD;  Location: [facility] ENDOSCOPY;  Service: Cardiovascular;  Laterality: N/A;   CARDIOVERSION N/A 03/09/2020   Procedure: CARDIOVERSION;  Surgeon: Buford Dresser, MD;  Location: [place];  Service: Cardiovascular;  Laterality: N/A;   CARDIOVERSION N/A 07/06/2020   Procedure: CARDIOVERSION;  Surgeon: Sanda Klein, MD;  Location: [place];  Service: Cardiovascular;  Laterality: N/A;   CARDIOVERSION N/A 01/18/2021   Procedure: CARDIOVERSION;  Surgeon: Skeet Latch, MD;  Location: [facility] ENDOSCOPY;  Service: Cardiovascular;  Laterality: N/A;   CARDIOVERSION N/A 06/17/2021   Procedure: CARDIOVERSION;  Surgeon: Geralynn Rile, MD;  Location: [place];  Service: Cardiovascular;  Laterality: N/A;   Cataract surgery Right    COLONOSCOPY     COLONOSCOPY N/A 12/30/2017   Procedure: COLONOSCOPY;  Surgeon: Rogene Houston, MD;  [facility];  Service: Endoscopy;  Laterality: N/A;   CORONARY ARTERY BYPASS GRAFT N/A 05/22/2014   Procedure:  CORONARY ARTERY BYPASS GRAFTING (CABG) times three using left internal mammary and right saphenous vein.;  Surgeon: Melrose Nakayama, MD;  Location: [place];  Service: Open Heart Surgery;  Laterality: N/A;   ENDARTERECTOMY Left 02/17/2014   Procedure: ENDARTERECTOMY CAROTID WITH PATCH ANGIOPLASTY;  Surgeon: Mal Misty, MD;  Location: [place];  Service: Vascular;  Laterality: Left;   ESOPHAGEAL DILATION N/A 08/22/2015   Procedure: ESOPHAGEAL DILATION;  Surgeon: Rogene Houston, MD;  [facility];  Service: Endoscopy;  Laterality:  N/A;   ESOPHAGOGASTRODUODENOSCOPY N/A 08/22/2015   Procedure: ESOPHAGOGASTRODUODENOSCOPY (EGD);  Surgeon: Rogene Houston, MD;  [facility];  Service: Endoscopy;  Laterality: N/A;  12:45 - moved to 1:55 - Ann notified pt   ESOPHAGOGASTRODUODENOSCOPY N/A 12/30/2017   Procedure: ESOPHAGOGASTRODUODENOSCOPY (EGD);  Surgeon: Rogene Houston, MD;  [facility];  Service: Endoscopy;  Laterality: N/A;  200   EYE SURGERY     cataract extraction (right) with repair macular tear , with IOL     right   FRACTURE SURGERY     bilateral wrist fractures- one ORIF   JOINT REPLACEMENT  2011   left knee   LEFT HEART CATHETERIZATION WITH CORONARY ANGIOGRAM N/A 05/22/2014   Procedure: LEFT HEART CATHETERIZATION WITH CORONARY ANGIOGRAM;  Surgeon: Leonie Man, MD;  Location: [facility] CATH LAB;  Service: Cardiovascular;  Laterality: N/A;   LITHOTRIPSY     PARS PLANA VITRECTOMY W/ REPAIR OF MACULAR HOLE     RHINOPLASTY     TEE WITHOUT CARDIOVERSION N/A 04/29/2017   Procedure: TRANSESOPHAGEAL ECHOCARDIOGRAM (TEE) WITH PROPOFOL;  Surgeon: Satira Sark, MD;  [facility];  Service: Cardiovascular;  Laterality: N/A;   TONSILLECTOMY     TOTAL HIP ARTHROPLASTY  12/08/2011   Procedure: TOTAL HIP ARTHROPLASTY;  Surgeon: Gearlean Alf, MD;  Location: [facility] ORS;  Service: Orthopedics;  Laterality: Right;   UPPER GI ENDOSCOPY  12/18/2015   Procedure: UPPER GI ENDOSCOPY;  Surgeon: Ralene Ok, MD;  Location: [facility] ORS;  Service: General;;   WRIST SURGERY Right 26yr ago   WRIST SURGERY Left    Patient Active Problem List   Diagnosis Date Noted   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   Ground glass opacity present on imaging of lung 12/31/2021   Chronic kidney disease, stage 3b ([place] 12/13/2021   Hemoptysis 12/13/2021   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) ([place] 12/13/2021   CAD (coronary artery disease) 12/10/2021   Acute respiratory failure with hypoxia ([place] 12/10/2021    Posterior vitreous detachment of left eye 02/13/2020   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   Vitreous floaters, left 02/13/2020   History of vitrectomy 02/13/2020   Left epiretinal membrane 02/13/2020   HLD (hyperlipidemia) 12/07/2019   Low back pain 11/15/2019   Lumbar post-laminectomy syndrome 11/15/2019   Spinal stenosis of lumbar region 11/15/2019   Pain in joint of right hip 06/09/2019   Trigger finger 04/08/2019   Special screening for malignant neoplasms, colon 11/25/2017   Absolute anemia 11/25/2017   Persistent atrial fibrillation ([place] 07/28/2017   GERD (gastroesophageal reflux disease) 04/28/2017   AKI (acute kidney injury) ([place] 04/28/2017   Achalasia 12/18/2015   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   Encounter for therapeutic drug monitoring 05/31/2014   Atrial fibrillation with rapid ventricular response ([place] 05/22/2014   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   Abnormal EKG    NSTEMI (non-ST elevated myocardial infarction) ([place]    Carotid artery stenosis without cerebral infarction  Benign non-nodular prostatic hyperplasia with lower urinary tract symptoms 03/27/2014   Chest pain 02/09/2014   HTN (hypertension) 02/09/2014   Carotid stenosis 08/02/2013   Peripheral edema 08/02/2013   Urge incontinence of urine 10/04/2012   OSA (obstructive sleep apnea) 09/21/2012   Recurrent nephrolithiasis 04/05/2012   Difficulty walking 01/29/2012   Balance disorder 01/29/2012   OA (osteoarthritis) of hip 12/08/2011   History of revision of total replacement of right hip joint 12/08/2011   ED (erectile dysfunction) of organic origin 04/07/2011   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   History of total knee replacement, left 09/10/2009    ONSET DATE: July 2023 had pneumonia  REFERRING DIAG: PT eval/tx for gait and balance per Asencion Noble, MD   THERAPY DIAG:  Difficulty in walking, not elsewhere classified  Other symptoms and signs involving the musculoskeletal system  Pain in right hip  Rationale for  Evaluation and Treatment Rehabilitation  SUBJECTIVE:                                                                                                                                                                                              SUBJECTIVE STATEMENT: Stated he called MDs office and talked to receptionist who stated no fractures found, found some arthritis in spine.  Reports he has been able to do more activities, able to blow leaves for 4 hours over 2 days with rest breaks required.  No reports of pain today.    Eval:" My balance is not good"; reports increased weakness since hospitalization in July for pneumonia.  Had a fall right after d/c from hospital and landed on Right hip. Currently holding fluid  Pt accompanied by: significant other Catalina Lunger  PERTINENT HISTORY:  CHF R THA L TKA Spinal stenosis MI  PAIN:  Are you having pain? Yes: NPRS scale: 0/10 Pain location: Right hip Pain description: sore, achy Aggravating factors: standing, walking Relieving factors: sitting, resting, tylenol  PRECAUTIONS: Fall  WEIGHT BEARING RESTRICTIONS No  FALLS: Has patient fallen in last 6 months? Yes. Number of falls 1  LIVING ENVIRONMENT: Lives with: lives with their spouse Lives in: House/apartment Stairs: No Has following equipment at home: Single point cane, Environmental consultant - 2 wheeled, Environmental consultant - 4 wheeled, shower chair, Grab bars, and None  PLOF: Independent with household mobility with device  PATIENT GOALS ease pain in my hip  OBJECTIVE:   DIAGNOSTIC FINDINGS: none recent  COGNITION: Overall cognitive status: Within functional limits for tasks assessed   SENSATION: WFL   EDEMA: bilateral legs wearing compression stockings       LOWER EXTREMITY MMT:    MMT  Right Eval Left Eval  Hip flexion 4- 4  Hip extension 4- 4  Hip abduction 4- 4  Hip adduction    Hip internal rotation    Hip external rotation    Knee flexion    Knee extension 4+ 5  Ankle  dorsiflexion 4- 5  Ankle plantarflexion    Ankle inversion    Ankle eversion    (Blank rows = not tested)     GAIT: Gait pattern: decreased stance time- Right and decreased hip/knee flexion- Right Distance walked: 278 ft Assistive device utilized: Single point cane Level of assistance: SBA Comments: wearing 2 L of O2  FUNCTIONAL TESTs:  5 times sit to stand: 16 sec 2 minute walk test: 278 ft  with SPC and 2L of O2  SLS Right 1 sec, Left 4 sec  PATIENT SURVEYS:  FOTO 50  TODAY'S TREATMENT:  03/26/22 STS no HHA  eccentric control Toe tapping on 6in step height alternating no HHA Tandem stance 2x 30" Sidestep 2RT (18f in hallway) Retro 2RT (171fin hallway) Heel and toe raise 10x Squat front of chair with HHA 10x   03/21/22 Seated on BOSU cone reach forward and rotation STS 2x 5 no HHA eccentric control Standing: Heel raise 10 Tandem stance 2x 30" Sidelying: abduction Supine: bridges  03/19/22 Supine: LTR x 10 SKTC with towel 10" x 10 Hooklying Hip adduction with ball 5" x 10 Hooklying Hip abduction with belt 5" x 10 SAQ's 2# 2 x 10 each leg  Sidelying Right hip clam x 10 (painful)  Sitting: LAQ 2 x 10 each  Sit to stand 2 x 5; no UE assist      03/12/22 Supine: SKTC 3 x 20" LTR x 10 Hip adduction 5" x 10 Hip abduction 5" x 10 Bridge (painful) Manual hip distraction right x 8 TA bracing 5" hold x 8  Standing: Lumbar extensions x 10 (painful) Lateral hip glides x 10 Heel raises/ toe raises 2 x 10  Seated Repeated lumbar flexion x 8     03/10/22 Review of HEP and goals Hip extension and abduction strength testing  Supine: Bridge x 8 LTR x 10 Hip adduction with ball 5" x 8 Hip abduction with GTB x 10  Sit to stand 3 x 5 from mat table without UE assist  Standing: // bars Tandem stance 2 x 30" each SLS 3 x 10" each 4" toe taps x 10 4" step ups x 10 each  O2 sat remained at 90% and above without oxygen on throughout  treatment    Physical therapy evaluation and HEP instruction   PATIENT EDUCATION:  Education details: Patient educated on exam findings, POC, scope of PT, HEP. Person educated: Patient Education method: Explanation, Demonstration, and Handouts Education comprehension: verbalized understanding, returned demonstration, verbal cues required, and tactile cues required    HOME EXERCISE PROGRAM: Access Code: [email or web address]: [email or web address] Date: 03/12/2022 Prepared by: [facility] - Rehab  Exercises - Sit to Stand with Counter Support  - 3 x daily - 7 x weekly - 1 sets - 10 reps - Standing Single Leg Stance with Counter Support  - 2 x daily - 7 x weekly - 1 sets - 3 reps - 10" hold - Standing Tandem Balance with Counter Support  - 2 x daily - 7 x weekly - 1 sets - 10 reps - 10" hold - Supine Bridge  - 2 x daily - 7 x weekly - 1 sets - 10 reps - Supine Lower Trunk  Rotation  - 2 x daily - 7 x weekly - 1 sets - 10 reps - Supine Hip Adduction Isometric with Ball  - 2 x daily - 7 x weekly - 1 sets - 10 reps - Hooklying Clamshell with Resistance  - 2 x daily - 7 x weekly - 1 sets - 10 reps - Seated Flexion Stretch  - 2 x daily - 7 x weekly - 1 sets - 10 reps - 2 sec hold - Hooklying Single Knee to Chest Stretch  - 2 x daily - 7 x weekly - 1 sets - 10 reps - 10 sec hold  Access Code: [email or web address] URL: [email or web address] Date: 03/06/2022 Prepared by: [facility] - Re[facility]  Exercises - Sit to Stand with Counter Support  - 3 x daily - 7 x weekly - 1 sets - 10 reps - Standing Single Leg Stance with Counter Support  - 1 x daily - 7 x weekly - 1 sets - 3 reps - 10" hold - Standing Tandem Balance with Counter Support  - 1 x daily - 7 x weekly - 1 sets - 10 reps - 10" hold    GOALS: Goals reviewed with patient? Yes  SHORT TERM GOALS: Target date: 03/20/2022   patient will be independent with initial HEP Baseline: Goal status: IN PROGRESS  2.  Patient will be able to stand 5  sec on each leg with SLS to improve functional balance and demonstrate decreased fall risk.  Baseline:  Goal status: IN PROGRESS   LONG TERM GOALS: Target date: 04/03/2022   Patient will be independent with advanced HEP and self management strategies to improve quality of life and functional outcomes.  Baseline:  Goal status: IN PROGRESS  2.  Patient will report at least 50% improvement in overall symptoms and/or function to demonstrate improved functional mobility  Baseline:  Goal status: IN PROGRESS  3.  Patient will be able to stand 10 sec on each leg with SLS to improve functional balance and demonstrate decreased fall risk.  Baseline:  Goal status: IN PROGRESS  4.  Patient will improve 5 x STS score from 16 sec to 13 sec to demonstrate improved functional mobility and increased lower extremity strength.  Baseline:  Goal status: IN PROGRESS  5.  Patient will increase 2 MWT to 300 ft to demonstrate improved functional mobility. Baseline: 278 ft Goal status: IN PROGRESS   ASSESSMENT:  CLINICAL IMPRESSION: Pt improving standing tolerance regarding hip pain.  Session focus with increased weight bearing activities for balance training and functional strengthening, monitored pain through session  Added sidestep and retro gait to improve stance phase during gait and additional squats for gluteal strengthening.  Pt did required intermittent seated rest breaks due to fatigue.  Reports a stretch in Rt hip, no reports of pain through session.   OBJECTIVE IMPAIRMENTS Abnormal gait, cardiopulmonary status limiting activity, decreased activity tolerance, decreased balance, decreased endurance, decreased mobility, difficulty walking, decreased ROM, decreased strength, hypomobility, increased edema, increased fascial restrictions, impaired perceived functional ability, impaired flexibility, and pain.   ACTIVITY LIMITATIONS carrying, lifting, bending, standing, squatting, sleeping, stairs,  transfers, locomotion level, and caring for others  PARTICIPATION LIMITATIONS: meal prep, cleaning, laundry, driving, shopping, community activity, and yard work  PERSONAL FACTORS 1-2 comorbidities: CHF, HBP  are also affecting patient's functional outcome.   REHAB POTENTIAL: Good  CLINICAL DECISION MAKING: Evolving/moderate complexity  EVALUATION COMPLEXITY: Moderate  PLAN: PT FREQUENCY: 2x/week  PT DURATION: 4 weeks  PLANNED INTERVENTIONS: Therapeutic exercises, Therapeutic activity,  Neuromuscular re-education, Balance training, Gait training, Patient/Family education, Joint manipulation, Joint mobilization, Stair training, Orthotic/Fit training, DME instructions, Aquatic Therapy, Dry Needling, Electrical stimulation, Spinal manipulation, Spinal mobilization, Cryotherapy, Moist heat, Compression bandaging, scar mobilization, Splintting, Taping, Traction, Ultrasound, Ionotophoresis '4mg'$ /ml Dexamethasone, and Manual therapy  PLAN FOR NEXT SESSION:   progress lower extremity strengthening and balance as able. Flexion based activity; spinal decompression.  Ihor Austin, LPTA/CLT; CBIS [phone]  9:51 AM, 03/26/22

## 2022-03-26 NOTE — Telephone Encounter (Signed)
-----   Message from Erma Heritage, [place] sent at 03/21/2022  2:46 PM EDT ----- Please let the patient know that his fluid level still remains elevated and kidney function has trended up slightly over the past few weeks but still improved significantly when compared to values earlier this year. Sodium and potassium are within a normal range. If his weight has been stable, I would recommend reducing Lasix from '60mg'$  daily to 40 mg daily alternating with 60 mg daily. Continue to follow daily weights and please let us know if this increases by more than 3 pounds overnight or 5 pounds in 1 week.

## 2022-03-26 NOTE — Telephone Encounter (Signed)
Pt notified of test results. Order placed.

## 2022-03-28 ENCOUNTER — Ambulatory Visit (HOSPITAL_COMMUNITY): Payer: Medicare HMO

## 2022-03-28 ENCOUNTER — Encounter (HOSPITAL_COMMUNITY): Payer: Self-pay

## 2022-03-28 DIAGNOSIS — M25551 Pain in right hip: Secondary | ICD-10-CM

## 2022-03-28 DIAGNOSIS — R262 Difficulty in walking, not elsewhere classified: Secondary | ICD-10-CM

## 2022-03-28 DIAGNOSIS — R29898 Other symptoms and signs involving the musculoskeletal system: Secondary | ICD-10-CM

## 2022-03-28 NOTE — Therapy (Signed)
OUTPATIENT PHYSICAL THERAPY NEURO TREATMENT   Patient Name: Caleb Wolf MRN: [id number] DOB:1940/09/14, 81 y.o., male Today's Date: 03/28/2022   PCP: Asencion Noble, MD  REFERRING PROVIDER: Asencion Noble, MD    PT End of Session - 03/28/22 0920     Visit Number 7    Number of Visits 8    Date for PT Re-Evaluation 04/03/22    Authorization Type [employer] HMO    Authorization Time Period no VL, no co insurance, no auth, copay $35    PT Start Time 0906    PT Stop Time 0944    PT Time Calculation (min) 38 min    Equipment Utilized During Treatment Gait belt   SBA, 2MWT complete with SPC   Activity Tolerance Patient tolerated treatment well;Patient limited by fatigue;No increased pain    Behavior During Therapy [facility] for tasks assessed/performed                  Past Medical History:  Diagnosis Date   AAA (abdominal aortic aneurysm) (HCC)    Arthritis    Basal cell carcinoma    Carotid stenosis    Chronic kidney disease, stage 3b ([place]) 12/13/2021   Coronary artery disease    Multvessel s/p CABG 2015   Enlarged prostate    Essential hypertension    GERD (gastroesophageal reflux disease)    Gout    History of colon polyps    History of kidney stones    Hyperlipidemia    Lumbar disc disease    Persistent atrial fibrillation (HCC)    Sleep apnea    CPAP   Past Surgical History:  Procedure Laterality Date   ABLATION OF DYSRHYTHMIC FOCUS  07/28/2017   ATRIAL FIBRILLATION ABLATION N/A 07/28/2017   Procedure: ATRIAL FIBRILLATION ABLATION;  Surgeon: Thompson Grayer, MD;  Location: [place] CV LAB;  Service: Cardiovascular;  Laterality: N/A;   ATRIAL FIBRILLATION ABLATION N/A 12/04/2020   Procedure: ATRIAL FIBRILLATION ABLATION;  Surgeon: Thompson Grayer, MD;  Location: [place] CV LAB;  Service: Cardiovascular;  Laterality: N/A;   BACK SURGERY     CARDIAC CATHETERIZATION  05/22/2014   Procedure: IABP INSERTION;  Surgeon: Leonie Man, MD;  Location: [facility] CATH LAB;   Service: Cardiovascular;;   CARDIOVERSION N/A 04/29/2017   Procedure: CARDIOVERSION;  Surgeon: Satira Sark, MD;  [facility];  Service: Cardiovascular;  Laterality: N/A;   CARDIOVERSION N/A 08/13/2017   Procedure: CARDIOVERSION;  Surgeon: Pixie Casino, MD;  Location: [place];  Service: Cardiovascular;  Laterality: N/A;   CARDIOVERSION N/A 08/15/2019   Procedure: CARDIOVERSION;  Surgeon: Dorothy Spark, MD;  Location: [facility] ENDOSCOPY;  Service: Cardiovascular;  Laterality: N/A;   CARDIOVERSION N/A 03/09/2020   Procedure: CARDIOVERSION;  Surgeon: Buford Dresser, MD;  Location: [place];  Service: Cardiovascular;  Laterality: N/A;   CARDIOVERSION N/A 07/06/2020   Procedure: CARDIOVERSION;  Surgeon: Sanda Klein, MD;  Location: [place];  Service: Cardiovascular;  Laterality: N/A;   CARDIOVERSION N/A 01/18/2021   Procedure: CARDIOVERSION;  Surgeon: Skeet Latch, MD;  Location: [facility] ENDOSCOPY;  Service: Cardiovascular;  Laterality: N/A;   CARDIOVERSION N/A 06/17/2021   Procedure: CARDIOVERSION;  Surgeon: Geralynn Rile, MD;  Location: [place];  Service: Cardiovascular;  Laterality: N/A;   Cataract surgery Right    COLONOSCOPY     COLONOSCOPY N/A 12/30/2017   Procedure: COLONOSCOPY;  Surgeon: Rogene Houston, MD;  [facility];  Service: Endoscopy;  Laterality: N/A;   CORONARY ARTERY  BYPASS GRAFT N/A 05/22/2014   Procedure: CORONARY ARTERY BYPASS GRAFTING (CABG) times three using left internal mammary and right saphenous vein.;  Surgeon: Melrose Nakayama, MD;  Location: [place];  Service: Open Heart Surgery;  Laterality: N/A;   ENDARTERECTOMY Left 02/17/2014   Procedure: ENDARTERECTOMY CAROTID WITH PATCH ANGIOPLASTY;  Surgeon: Mal Misty, MD;  Location: [place];  Service: Vascular;  Laterality: Left;   ESOPHAGEAL DILATION N/A 08/22/2015   Procedure: ESOPHAGEAL DILATION;  Surgeon: Rogene Houston, MD;  [facility];   Service: Endoscopy;  Laterality: N/A;   ESOPHAGOGASTRODUODENOSCOPY N/A 08/22/2015   Procedure: ESOPHAGOGASTRODUODENOSCOPY (EGD);  Surgeon: Rogene Houston, MD;  [facility];  Service: Endoscopy;  Laterality: N/A;  12:45 - moved to 1:55 - Ann notified pt   ESOPHAGOGASTRODUODENOSCOPY N/A 12/30/2017   Procedure: ESOPHAGOGASTRODUODENOSCOPY (EGD);  Surgeon: Rogene Houston, MD;  [facility];  Service: Endoscopy;  Laterality: N/A;  200   EYE SURGERY     cataract extraction (right) with repair macular tear , with IOL     right   FRACTURE SURGERY     bilateral wrist fractures- one ORIF   JOINT REPLACEMENT  2011   left knee   LEFT HEART CATHETERIZATION WITH CORONARY ANGIOGRAM N/A 05/22/2014   Procedure: LEFT HEART CATHETERIZATION WITH CORONARY ANGIOGRAM;  Surgeon: Leonie Man, MD;  Location: [facility] CATH LAB;  Service: Cardiovascular;  Laterality: N/A;   LITHOTRIPSY     PARS PLANA VITRECTOMY W/ REPAIR OF MACULAR HOLE     RHINOPLASTY     TEE WITHOUT CARDIOVERSION N/A 04/29/2017   Procedure: TRANSESOPHAGEAL ECHOCARDIOGRAM (TEE) WITH PROPOFOL;  Surgeon: Satira Sark, MD;  [facility];  Service: Cardiovascular;  Laterality: N/A;   TONSILLECTOMY     TOTAL HIP ARTHROPLASTY  12/08/2011   Procedure: TOTAL HIP ARTHROPLASTY;  Surgeon: Gearlean Alf, MD;  Location: [facility] ORS;  Service: Orthopedics;  Laterality: Right;   UPPER GI ENDOSCOPY  12/18/2015   Procedure: UPPER GI ENDOSCOPY;  Surgeon: Ralene Ok, MD;  Location: [facility] ORS;  Service: General;;   WRIST SURGERY Right 36yr ago   WRIST SURGERY Left    Patient Active Problem List   Diagnosis Date Noted   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   Ground glass opacity present on imaging of lung 12/31/2021   Chronic kidney disease, stage 3b ([facility] 12/13/2021   Hemoptysis 12/13/2021   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) ([facility] 12/13/2021   CAD (coronary artery disease) 12/10/2021   Acute respiratory failure  with hypoxia ([facility] 12/10/2021   Posterior vitreous detachment of left eye 02/13/2020   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   Vitreous floaters, left 02/13/2020   History of vitrectomy 02/13/2020   Left epiretinal membrane 02/13/2020   HLD (hyperlipidemia) 12/07/2019   Low back pain 11/15/2019   Lumbar post-laminectomy syndrome 11/15/2019   Spinal stenosis of lumbar region 11/15/2019   Pain in joint of right hip 06/09/2019   Trigger finger 04/08/2019   Special screening for malignant neoplasms, colon 11/25/2017   Absolute anemia 11/25/2017   Persistent atrial fibrillation ([facility] 07/28/2017   GERD (gastroesophageal reflux disease) 04/28/2017   AKI (acute kidney injury) ([facility] 04/28/2017   Achalasia 12/18/2015   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   Encounter for therapeutic drug monitoring 05/31/2014   Atrial fibrillation with rapid ventricular response ([facility] 05/22/2014   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   Abnormal EKG    NSTEMI (non-ST elevated myocardial infarction) ([facility]    Carotid  artery stenosis without cerebral infarction    Benign non-nodular prostatic hyperplasia with lower urinary tract symptoms 03/27/2014   Chest pain 02/09/2014   HTN (hypertension) 02/09/2014   Carotid stenosis 08/02/2013   Peripheral edema 08/02/2013   Urge incontinence of urine 10/04/2012   OSA (obstructive sleep apnea) 09/21/2012   Recurrent nephrolithiasis 04/05/2012   Difficulty walking 01/29/2012   Balance disorder 01/29/2012   OA (osteoarthritis) of hip 12/08/2011   History of revision of total replacement of right hip joint 12/08/2011   ED (erectile dysfunction) of organic origin 04/07/2011   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   History of total knee replacement, left 09/10/2009    ONSET DATE: July 2023 had pneumonia  REFERRING DIAG: PT eval/tx for gait and balance per Asencion Noble, MD   THERAPY DIAG:  Difficulty in walking, not elsewhere classified  Other symptoms and signs involving the musculoskeletal  system  Pain in right hip  Rationale for Evaluation and Treatment Rehabilitation  SUBJECTIVE:                                                                                                                                                                                              SUBJECTIVE STATEMENT: Feeling good today, no reports of pain currently.  Pt stated main difficulty with endurance.  Eval:" My balance is not good"; reports increased weakness since hospitalization in July for pneumonia.  Had a fall right after d/c from hospital and landed on Right hip. Currently holding fluid  Pt accompanied by: significant other Catalina Lunger  PERTINENT HISTORY:  CHF R THA L TKA Spinal stenosis MI  PAIN:  Are you having pain? Yes: NPRS scale: 0/10 Pain location: Right hip Pain description: sore, achy Aggravating factors: standing, walking Relieving factors: sitting, resting, tylenol  PRECAUTIONS: Fall  WEIGHT BEARING RESTRICTIONS No  FALLS: Has patient fallen in last 6 months? Yes. Number of falls 1  LIVING ENVIRONMENT: Lives with: lives with their spouse Lives in: House/apartment Stairs: No Has following equipment at home: Single point cane, Environmental consultant - 2 wheeled, Environmental consultant - 4 wheeled, shower chair, Grab bars, and None  PLOF: Independent with household mobility with device  PATIENT GOALS ease pain in my hip  OBJECTIVE:   DIAGNOSTIC FINDINGS: none recent  COGNITION: Overall cognitive status: Within functional limits for tasks assessed   SENSATION: WFL   EDEMA: bilateral legs wearing compression stockings       LOWER EXTREMITY MMT:    MMT Right Eval Left Eval  Hip flexion 4- 4  Hip extension 4- 4  Hip abduction 4- 4  Hip adduction    Hip internal rotation  Hip external rotation    Knee flexion    Knee extension 4+ 5  Ankle dorsiflexion 4- 5  Ankle plantarflexion    Ankle inversion    Ankle eversion    (Blank rows = not tested)     GAIT: Gait  pattern: decreased stance time- Right and decreased hip/knee flexion- Right Distance walked: 278 ft Assistive device utilized: Single point cane Level of assistance: SBA Comments: wearing 2 L of O2  FUNCTIONAL TESTs:  5 times sit to stand: 16 sec 2 minute walk test: 278 ft  with SPC and 2L of O2  SLS Right 1 sec, Left 4 sec  PATIENT SURVEYS:  FOTO 50  TODAY'S TREATMENT:  03/28/22 Heel raises 10x 2MWT 268f, reports of increased Rt hip symptoms following 1'30" O2 sat 94% 10 STS no HHA eccentic control Squat front of chair 2 sets 10reps Toe tapping 6in 20x  Sidestep 2RT RTB around thigh (~182f SLS Lt 8", Rt 3-4" Vector stance 3x5"  03/26/22 STS no HHA  eccentric control Toe tapping on 6in step height alternating no HHA Tandem stance 2x 30" Sidestep 2RT (1213fn hallway) Retro 2RT (83f24f hallway) Heel and toe raise 10x Squat front of chair with HHA 10x   03/21/22 Seated on BOSU cone reach forward and rotation STS 2x 5 no HHA eccentric control Standing: Heel raise 10 Tandem stance 2x 30" Sidelying: abduction Supine: bridges  03/19/22 Supine: LTR x 10 SKTC with towel 10" x 10 Hooklying Hip adduction with ball 5" x 10 Hooklying Hip abduction with belt 5" x 10 SAQ's 2# 2 x 10 each leg  Sidelying Right hip clam x 10 (painful)  Sitting: LAQ 2 x 10 each  Sit to stand 2 x 5; no UE assist      03/12/22 Supine: SKTC 3 x 20" LTR x 10 Hip adduction 5" x 10 Hip abduction 5" x 10 Bridge (painful) Manual hip distraction right x 8 TA bracing 5" hold x 8  Standing: Lumbar extensions x 10 (painful) Lateral hip glides x 10 Heel raises/ toe raises 2 x 10  Seated Repeated lumbar flexion x 8     03/10/22 Review of HEP and goals Hip extension and abduction strength testing  Supine: Bridge x 8 LTR x 10 Hip adduction with ball 5" x 8 Hip abduction with GTB x 10  Sit to stand 3 x 5 from mat table without UE assist  Standing: // bars Tandem  stance 2 x 30" each SLS 3 x 10" each 4" toe taps x 10 4" step ups x 10 each  O2 sat remained at 90% and above without oxygen on throughout treatment    Physical therapy evaluation and HEP instruction   PATIENT EDUCATION:  Education details: Patient educated on exam findings, POC, scope of PT, HEP. Person educated: Patient Education method: Explanation, Demonstration, and Handouts Education comprehension: verbalized understanding, returned demonstration, verbal cues required, and tactile cues required    HOME EXERCISE PROGRAM: Access Code: [email or web address]: [email or web address] Date: 03/12/2022 Prepared by: [facility] - Rehab  Exercises - Sit to Stand with Counter Support  - 3 x daily - 7 x weekly - 1 sets - 10 reps - Standing Single Leg Stance with Counter Support  - 2 x daily - 7 x weekly - 1 sets - 3 reps - 10" hold - Standing Tandem Balance with Counter Support  - 2 x daily - 7 x weekly - 1 sets - 10 reps - 10" hold - Supine Bridge  -  2 x daily - 7 x weekly - 1 sets - 10 reps - Supine Lower Trunk Rotation  - 2 x daily - 7 x weekly - 1 sets - 10 reps - Supine Hip Adduction Isometric with Ball  - 2 x daily - 7 x weekly - 1 sets - 10 reps - Hooklying Clamshell with Resistance  - 2 x daily - 7 x weekly - 1 sets - 10 reps - Seated Flexion Stretch  - 2 x daily - 7 x weekly - 1 sets - 10 reps - 2 sec hold - Hooklying Single Knee to Chest Stretch  - 2 x daily - 7 x weekly - 1 sets - 10 reps - 10 sec hold  Access Code: [email or web address] URL: [email or web address] Date: 03/06/2022 Prepared by: [facility] - [facility]  Exercises - Sit to Stand with Counter Support  - 3 x daily - 7 x weekly - 1 sets - 10 reps - Standing Single Leg Stance with Counter Support  - 1 x daily - 7 x weekly - 1 sets - 3 reps - 10" hold - Standing Tandem Balance with Counter Support  - 1 x daily - 7 x weekly - 1 sets - 10 reps - 10" hold  03/28/22: squat    GOALS: Goals reviewed with patient?  Yes  SHORT TERM GOALS: Target date: 03/20/2022   patient will be independent with initial HEP Baseline: Goal status: IN PROGRESS  2.  Patient will be able to stand 5 sec on each leg with SLS to improve functional balance and demonstrate decreased fall risk.  Baseline:  Goal status: IN PROGRESS   LONG TERM GOALS: Target date: 04/03/2022   Patient will be independent with advanced HEP and self management strategies to improve quality of life and functional outcomes.  Baseline:  Goal status: IN PROGRESS  2.  Patient will report at least 50% improvement in overall symptoms and/or function to demonstrate improved functional mobility  Baseline:  Goal status: IN PROGRESS  3.  Patient will be able to stand 10 sec on each leg with SLS to improve functional balance and demonstrate decreased fall risk.  Baseline:  Goal status: IN PROGRESS  4.  Patient will improve 5 x STS score from 16 sec to 13 sec to demonstrate improved functional mobility and increased lower extremity strength.  Baseline:  Goal status: IN PROGRESS  5.  Patient will increase 2 MWT to 300 ft to demonstrate improved functional mobility. Baseline: 278 ft Goal status: IN PROGRESS   ASSESSMENT:  CLINICAL IMPRESSION: Pt educated on beginning walking program to address endurance at home, 2MWT complete this session with ability to ambulate 260f with SPC no LOB but did report increased SOB and some aggravation of Rt hip that was resolved following seated for short duration.  Session focus on LE functional strengthening and balalnce training.  Added resistance with sidestepping for glut med strengthening that was tolerated well.  Resumed SLS based exercises this session with increased difficulty, reviewed importance of completing this daily at home.  Added vector stance to progress hip stability with HHA required.   OBJECTIVE IMPAIRMENTS Abnormal gait, cardiopulmonary status limiting activity, decreased activity  tolerance, decreased balance, decreased endurance, decreased mobility, difficulty walking, decreased ROM, decreased strength, hypomobility, increased edema, increased fascial restrictions, impaired perceived functional ability, impaired flexibility, and pain.   ACTIVITY LIMITATIONS carrying, lifting, bending, standing, squatting, sleeping, stairs, transfers, locomotion level, and caring for others  PARTICIPATION LIMITATIONS: meal prep, cleaning, laundry, driving, shopping, community activity, and  yard work  PERSONAL FACTORS 1-2 comorbidities: CHF, HBP  are also affecting patient's functional outcome.   REHAB POTENTIAL: Good  CLINICAL DECISION MAKING: Evolving/moderate complexity  EVALUATION COMPLEXITY: Moderate  PLAN: PT FREQUENCY: 2x/week  PT DURATION: 4 weeks  PLANNED INTERVENTIONS: Therapeutic exercises, Therapeutic activity, Neuromuscular re-education, Balance training, Gait training, Patient/Family education, Joint manipulation, Joint mobilization, Stair training, Orthotic/Fit training, DME instructions, Aquatic Therapy, Dry Needling, Electrical stimulation, Spinal manipulation, Spinal mobilization, Cryotherapy, Moist heat, Compression bandaging, scar mobilization, Splintting, Taping, Traction, Ultrasound, Ionotophoresis '4mg'$ /ml Dexamethasone, and Manual therapy  PLAN FOR NEXT SESSION:   progress lower extremity strengthening and balance as able. Flexion based activity; spinal decompression.  Progress note next session.  Ihor Austin, LPTA/CLT; CBIS [phone]  10:44 AM, 03/28/22

## 2022-04-02 ENCOUNTER — Ambulatory Visit (HOSPITAL_COMMUNITY): Payer: Medicare HMO | Attending: Internal Medicine

## 2022-04-02 DIAGNOSIS — R262 Difficulty in walking, not elsewhere classified: Secondary | ICD-10-CM | POA: Insufficient documentation

## 2022-04-02 DIAGNOSIS — R29898 Other symptoms and signs involving the musculoskeletal system: Secondary | ICD-10-CM | POA: Insufficient documentation

## 2022-04-02 DIAGNOSIS — M25551 Pain in right hip: Secondary | ICD-10-CM | POA: Insufficient documentation

## 2022-04-02 NOTE — Therapy (Signed)
OUTPATIENT PHYSICAL THERAPY NEURO DISCHARGE/PROGRESS NOTE PHYSICAL THERAPY DISCHARGE SUMMARY  Visits from Start of Care: 8  Current functional level related to goals / functional outcomes: See below   Remaining deficits: See below   Education / Equipment: See below   Patient agrees to discharge. Patient goals were partially met. Patient is being discharged due to being pleased with the current functional level.    Patient Name: Caleb Wolf MRN: [id number] DOB:Oct 13, 1940, 81 y.o., male, male Today's Date: 04/02/2022   PCP: Asencion Noble, MD  REFERRING PROVIDER: Asencion Noble, MD    PT End of Session - 04/02/22 1417     Visit Number 8    Number of Visits 8    Date for PT Re-Evaluation 04/03/22    Authorization Type [employer] HMO    Authorization Time Period no VL, no co insurance, no auth, copay $35    PT Start Time 1420    PT Stop Time 1455    PT Time Calculation (min) 35 min    Equipment Utilized During Treatment Gait belt   SBA, 2MWT complete with SPC   Activity Tolerance Patient tolerated treatment well;Patient limited by fatigue;No increased pain    Behavior During Therapy [facility] for tasks assessed/performed                   Past Medical History:  Diagnosis Date   AAA (abdominal aortic aneurysm) (HCC)    Arthritis    Basal cell carcinoma    Carotid stenosis    Chronic kidney disease, stage 3b ([place]) 12/13/2021   Coronary artery disease    Multvessel s/p CABG 2015   Enlarged prostate    Essential hypertension    GERD (gastroesophageal reflux disease)    Gout    History of colon polyps    History of kidney stones    Hyperlipidemia    Lumbar disc disease    Persistent atrial fibrillation (HCC)    Sleep apnea    CPAP   Past Surgical History:  Procedure Laterality Date   ABLATION OF DYSRHYTHMIC FOCUS  07/28/2017   ATRIAL FIBRILLATION ABLATION N/A 07/28/2017   Procedure: ATRIAL FIBRILLATION ABLATION;  Surgeon: Thompson Grayer, MD;  Location: [place] CV  LAB;  Service: Cardiovascular;  Laterality: N/A;   ATRIAL FIBRILLATION ABLATION N/A 12/04/2020   Procedure: ATRIAL FIBRILLATION ABLATION;  Surgeon: Thompson Grayer, MD;  Location: [place] CV LAB;  Service: Cardiovascular;  Laterality: N/A;   BACK SURGERY     CARDIAC CATHETERIZATION  05/22/2014   Procedure: IABP INSERTION;  Surgeon: Leonie Man, MD;  Location: [facility] CATH LAB;  Service: Cardiovascular;;   CARDIOVERSION N/A 04/29/2017   Procedure: CARDIOVERSION;  Surgeon: Satira Sark, MD;  [facility];  Service: Cardiovascular;  Laterality: N/A;   CARDIOVERSION N/A 08/13/2017   Procedure: CARDIOVERSION;  Surgeon: Pixie Casino, MD;  Location: [facility] ENDOSCOPY;  Service: Cardiovascular;  Laterality: N/A;   CARDIOVERSION N/A 08/15/2019   Procedure: CARDIOVERSION;  Surgeon: Dorothy Spark, MD;  Location: [facility] ENDOSCOPY;  Service: Cardiovascular;  Laterality: N/A;   CARDIOVERSION N/A 03/09/2020   Procedure: CARDIOVERSION;  Surgeon: Buford Dresser, MD;  Location: [place];  Service: Cardiovascular;  Laterality: N/A;   CARDIOVERSION N/A 07/06/2020   Procedure: CARDIOVERSION;  Surgeon: Sanda Klein, MD;  Location: [place];  Service: Cardiovascular;  Laterality: N/A;   CARDIOVERSION N/A 01/18/2021   Procedure: CARDIOVERSION;  Surgeon: Skeet Latch, MD;  Location: [place];  Service: Cardiovascular;  Laterality: N/A;   CARDIOVERSION  N/A 06/17/2021   Procedure: CARDIOVERSION;  Surgeon: Geralynn Rile, MD;  Location: [place];  Service: Cardiovascular;  Laterality: N/A;   Cataract surgery Right    COLONOSCOPY     COLONOSCOPY N/A 12/30/2017   Procedure: COLONOSCOPY;  Surgeon: Rogene Houston, MD;  [facility];  Service: Endoscopy;  Laterality: N/A;   CORONARY ARTERY BYPASS GRAFT N/A 05/22/2014   Procedure: CORONARY ARTERY BYPASS GRAFTING (CABG) times three using left internal mammary and right saphenous vein.;  Surgeon: Melrose Nakayama,  MD;  Location: [place];  Service: Open Heart Surgery;  Laterality: N/A;   ENDARTERECTOMY Left 02/17/2014   Procedure: ENDARTERECTOMY CAROTID WITH PATCH ANGIOPLASTY;  Surgeon: Mal Misty, MD;  Location: [place];  Service: Vascular;  Laterality: Left;   ESOPHAGEAL DILATION N/A 08/22/2015   Procedure: ESOPHAGEAL DILATION;  Surgeon: Rogene Houston, MD;  [facility];  Service: Endoscopy;  Laterality: N/A;   ESOPHAGOGASTRODUODENOSCOPY N/A 08/22/2015   Procedure: ESOPHAGOGASTRODUODENOSCOPY (EGD);  Surgeon: Rogene Houston, MD;  [facility];  Service: Endoscopy;  Laterality: N/A;  12:45 - moved to 1:55 - Ann notified pt   ESOPHAGOGASTRODUODENOSCOPY N/A 12/30/2017   Procedure: ESOPHAGOGASTRODUODENOSCOPY (EGD);  Surgeon: Rogene Houston, MD;  [facility];  Service: Endoscopy;  Laterality: N/A;  200   EYE SURGERY     cataract extraction (right) with repair macular tear , with IOL     right   FRACTURE SURGERY     bilateral wrist fractures- one ORIF   JOINT REPLACEMENT  2011   left knee   LEFT HEART CATHETERIZATION WITH CORONARY ANGIOGRAM N/A 05/22/2014   Procedure: LEFT HEART CATHETERIZATION WITH CORONARY ANGIOGRAM;  Surgeon: Leonie Man, MD;  Location: [facility] CATH LAB;  Service: Cardiovascular;  Laterality: N/A;   LITHOTRIPSY     PARS PLANA VITRECTOMY W/ REPAIR OF MACULAR HOLE     RHINOPLASTY     TEE WITHOUT CARDIOVERSION N/A 04/29/2017   Procedure: TRANSESOPHAGEAL ECHOCARDIOGRAM (TEE) WITH PROPOFOL;  Surgeon: Satira Sark, MD;  [facility];  Service: Cardiovascular;  Laterality: N/A;   TONSILLECTOMY     TOTAL HIP ARTHROPLASTY  12/08/2011   Procedure: TOTAL HIP ARTHROPLASTY;  Surgeon: Gearlean Alf, MD;  Location: [facility] ORS;  Service: Orthopedics;  Laterality: Right;   UPPER GI ENDOSCOPY  12/18/2015   Procedure: UPPER GI ENDOSCOPY;  Surgeon: Ralene Ok, MD;  Location: [facility] ORS;  Service: General;;   WRIST SURGERY Right 5yr ago   WRIST SURGERY Left     Patient Active Problem List   Diagnosis Date Noted   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   Ground glass opacity present on imaging of lung 12/31/2021   Chronic kidney disease, stage 3b ([place] [employer] 12/13/2021   Hemoptysis 12/13/2021   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) ([place] 12/13/2021   CAD (coronary artery disease) 12/10/2021   Acute respiratory failure with hypoxia ([place] 12/10/2021   Posterior vitreous detachment of left eye 02/13/2020   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   Vitreous floaters, left 02/13/2020   History of vitrectomy 02/13/2020   Left epiretinal membrane 02/13/2020   HLD (hyperlipidemia) 12/07/2019   Low back pain 11/15/2019   Lumbar post-laminectomy syndrome 11/15/2019   Spinal stenosis of lumbar region 11/15/2019   Pain in joint of right hip 06/09/2019   Trigger finger 04/08/2019   Special screening for malignant neoplasms, colon 11/25/2017   Absolute anemia 11/25/2017   Persistent atrial fibrillation [place] 07/28/2017   GERD (  gastroesophageal reflux disease) 04/28/2017   AKI (acute kidney injury) ([place]) 04/28/2017   Achalasia 12/18/2015   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   Encounter for therapeutic drug monitoring 05/31/2014   Atrial fibrillation with rapid ventricular response ([place]) 05/22/2014   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   Abnormal EKG    NSTEMI (non-ST elevated myocardial infarction) [facility])    Carotid artery stenosis without cerebral infarction    Benign non-nodular prostatic hyperplasia with lower urinary tract symptoms 03/27/2014   Chest pain 02/09/2014   HTN (hypertension) 02/09/2014   Carotid stenosis 08/02/2013   Peripheral edema 08/02/2013   Urge incontinence of urine 10/04/2012   OSA (obstructive sleep apnea) 09/21/2012   Recurrent nephrolithiasis 04/05/2012   Difficulty walking 01/29/2012   Balance disorder 01/29/2012   OA (osteoarthritis) of hip 12/08/2011   History of revision of total replacement of right hip joint 12/08/2011   ED  (erectile dysfunction) of organic origin 04/07/2011   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   History of total knee replacement, left 09/10/2009    ONSET DATE: July 2023 had pneumonia  REFERRING DIAG: PT eval/tx for gait and balance per Asencion Noble, MD   THERAPY DIAG:  Difficulty in walking, not elsewhere classified  Other symptoms and signs involving the musculoskeletal system  Pain in right hip  Rationale for Evaluation and Treatment Rehabilitation  SUBJECTIVE:                                                                                                                                                                                              SUBJECTIVE STATEMENT: Still has some lingering pain right hip with prolonged standing and walking; but pain is improved since start of therapy; "70-80% bettter"   Eval:" My balance is not good"; reports increased weakness since hospitalization in July for pneumonia.  Had a fall right after d/c from hospital and landed on Right hip. Currently holding fluid  Pt accompanied by: significant other Catalina Lunger  PERTINENT HISTORY:  CHF R THA L TKA Spinal stenosis MI  PAIN:  Are you having pain? Yes: NPRS scale: 0/10 Pain location: Right hip Pain description: sore, achy Aggravating factors: standing, walking Relieving factors: sitting, resting, tylenol  PRECAUTIONS: Fall  WEIGHT BEARING RESTRICTIONS No  FALLS: Has patient fallen in last 6 months? Yes. Number of falls 1  LIVING ENVIRONMENT: Lives with: lives with their spouse Lives in: House/apartment Stairs: No Has following equipment at home: Single point cane, Environmental consultant - 2 wheeled, Environmental consultant - 4 wheeled, shower chair, Grab bars, and None  PLOF: Independent with household mobility with device  PATIENT GOALS ease pain in my hip  OBJECTIVE:   DIAGNOSTIC FINDINGS: none recent  COGNITION: Overall cognitive status: Within functional limits for tasks  assessed   SENSATION: WFL   EDEMA: bilateral legs wearing compression stockings       LOWER EXTREMITY MMT:    MMT Right Eval Left Eval  Hip flexion 4- 4  Hip extension 4- 4  Hip abduction 4- 4  Hip adduction    Hip internal rotation    Hip external rotation    Knee flexion    Knee extension 4+ 5  Ankle dorsiflexion 4- 5  Ankle plantarflexion    Ankle inversion    Ankle eversion    (Blank rows = not tested)     GAIT: Gait pattern: decreased stance time- Right and decreased hip/knee flexion- Right Distance walked: 278 ft Assistive device utilized: Single point cane Level of assistance: SBA Comments: wearing 2 L of O2  FUNCTIONAL TESTs:  5 times sit to stand: 16 sec 2 minute walk test: 278 ft  with SPC and 2L of O2  SLS Right 1 sec, Left 4 sec  PATIENT SURVEYS:  FOTO 50  TODAY'S TREATMENT:  FOTO 59 Progress note 5 times sit to stand 14.99 sec SLS Left 5 sec; right 8 sec Review of HEP   03/28/22 Heel raises 10x 2MWT 237f, reports of increased Rt hip symptoms following 1'30" O2 sat 94% 10 STS no HHA eccentic control Squat front of chair 2 sets 10reps Toe tapping 6in 20x  Sidestep 2RT RTB around thigh (~152f SLS Lt 8", Rt 3-4" Vector stance 3x5"  03/26/22 STS no HHA  eccentric control Toe tapping on 6in step height alternating no HHA Tandem stance 2x 30" Sidestep 2RT (1230fn hallway) Retro 2RT (82f3f hallway) Heel and toe raise 10x Squat front of chair with HHA 10x   03/21/22 Seated on BOSU cone reach forward and rotation STS 2x 5 no HHA eccentric control Standing: Heel raise 10 Tandem stance 2x 30" Sidelying: abduction Supine: bridges  03/19/22 Supine: LTR x 10 SKTC with towel 10" x 10 Hooklying Hip adduction with ball 5" x 10 Hooklying Hip abduction with belt 5" x 10 SAQ's 2# 2 x 10 each leg  Sidelying Right hip clam x 10 (painful)  Sitting: LAQ 2 x 10 each  Sit to stand 2 x 5; no UE  assist      03/12/22 Supine: SKTC 3 x 20" LTR x 10 Hip adduction 5" x 10 Hip abduction 5" x 10 Bridge (painful) Manual hip distraction right x 8 TA bracing 5" hold x 8  Standing: Lumbar extensions x 10 (painful) Lateral hip glides x 10 Heel raises/ toe raises 2 x 10  Seated Repeated lumbar flexion x 8     03/10/22 Review of HEP and goals Hip extension and abduction strength testing  Supine: Bridge x 8 LTR x 10 Hip adduction with ball 5" x 8 Hip abduction with GTB x 10  Sit to stand 3 x 5 from mat table without UE assist  Standing: // bars Tandem stance 2 x 30" each SLS 3 x 10" each 4" toe taps x 10 4" step ups x 10 each  O2 sat remained at 90% and above without oxygen on throughout treatment    Physical therapy evaluation and HEP instruction   PATIENT EDUCATION:  Education details: Patient educated on exam findings, POC, scope of PT, HEP. Person educated: Patient Education method: Explanation, Demonstration, and Handouts Education comprehension: verbalized understanding, returned demonstration, verbal cues required, and tactile cues  required    HOME EXERCISE PROGRAM: Access Code: [email or web address] URL: [email or web address] Date: 03/12/2022 Prepared by: [facility] - Rehab  Exercises - Sit to Stand with Counter Support  - 3 x daily - 7 x weekly - 1 sets - 10 reps - Standing Single Leg Stance with Counter Support  - 2 x daily - 7 x weekly - 1 sets - 3 reps - 10" hold - Standing Tandem Balance with Counter Support  - 2 x daily - 7 x weekly - 1 sets - 10 reps - 10" hold - Supine Bridge  - 2 x daily - 7 x weekly - 1 sets - 10 reps - Supine Lower Trunk Rotation  - 2 x daily - 7 x weekly - 1 sets - 10 reps - Supine Hip Adduction Isometric with Ball  - 2 x daily - 7 x weekly - 1 sets - 10 reps - Hooklying Clamshell with Resistance  - 2 x daily - 7 x weekly - 1 sets - 10 reps - Seated Flexion Stretch  - 2 x daily - 7 x weekly - 1 sets - 10 reps - 2 sec  hold - Hooklying Single Knee to Chest Stretch  - 2 x daily - 7 x weekly - 1 sets - 10 reps - 10 sec hold  Access Code: [email or web address] URL: [email or web address] Date: 03/06/2022 Prepared by: [facility] - Re[facility]  Exercises - Sit to Stand with Counter Support  - 3 x daily - 7 x weekly - 1 sets - 10 reps - Standing Single Leg Stance with Counter Support  - 1 x daily - 7 x weekly - 1 sets - 3 reps - 10" hold - Standing Tandem Balance with Counter Support  - 1 x daily - 7 x weekly - 1 sets - 10 reps - 10" hold  03/28/22: squat    GOALS: Goals reviewed with patient? Yes  SHORT TERM GOALS: Target date: 03/20/2022   patient will be independent with initial HEP Baseline: Goal status: MET  2.  Patient will be able to stand 5 sec on each leg with SLS to improve functional balance and demonstrate decreased fall risk.  Baseline:  Goal status: MET   LONG TERM GOALS: Target date: 04/03/2022   Patient will be independent with advanced HEP and self management strategies to improve quality of life and functional outcomes.  Baseline:  Goal status: MET  2.  Patient will report at least 50% improvement in overall symptoms and/or function to demonstrate improved functional mobility  Baseline:  Goal status: MET  3.  Patient will be able to stand 10 sec on each leg with SLS to improve functional balance and demonstrate decreased fall risk.  Baseline:  Goal status: IN PROGRESS  4.  Patient will improve 5 x STS score from 16 sec to 13 sec to demonstrate improved functional mobility and increased lower extremity strength.  Baseline: 04/02/22 14.99 sec  Goal status: IN PROGRESS  5.  Patient will increase 2 MWT to 300 ft to demonstrate improved functional mobility. Baseline: 278 ft Goal status: IN PROGRESS   ASSESSMENT:  CLINICAL IMPRESSION: Discharge visit today; patient has met 2/2 STG's and 2/5 LTG's; has made good progress in all tested areas.  He is interested in pursuing cardio  pulmonary rehabilitation as he feels his breathing is his main limiting factor with his activity.  Discharge to HEP today and patient in agreement with discharge plan.   OBJECTIVE IMPAIRMENTS Abnormal gait, cardiopulmonary status limiting activity, decreased activity tolerance,  decreased balance, decreased endurance, decreased mobility, difficulty walking, decreased ROM, decreased strength, hypomobility, increased edema, increased fascial restrictions, impaired perceived functional ability, impaired flexibility, and pain.   ACTIVITY LIMITATIONS carrying, lifting, bending, standing, squatting, sleeping, stairs, transfers, locomotion level, and caring for others  PARTICIPATION LIMITATIONS: meal prep, cleaning, laundry, driving, shopping, community activity, and yard work  PERSONAL FACTORS 1-2 comorbidities: CHF, HBP  are also affecting patient's functional outcome.   REHAB POTENTIAL: Good  CLINICAL DECISION MAKING: Evolving/moderate complexity  EVALUATION COMPLEXITY: Moderate  PLAN: PT FREQUENCY: 2x/week  PT DURATION: 4 weeks  PLANNED INTERVENTIONS: Therapeutic exercises, Therapeutic activity, Neuromuscular re-education, Balance training, Gait training, Patient/Family education, Joint manipulation, Joint mobilization, Stair training, Orthotic/Fit training, DME instructions, Aquatic Therapy, Dry Needling, Electrical stimulation, Spinal manipulation, Spinal mobilization, Cryotherapy, Moist heat, Compression bandaging, scar mobilization, Splintting, Taping, Traction, Ultrasound, Ionotophoresis 76m/ml Dexamethasone, and Manual therapy  PLAN FOR NEXT SESSION:   progress lower extremity strengthening and balance as able. Flexion based activity; spinal decompression.  Progress note next session.  3:00 PM, 04/02/22 Jannelle Notaro Small Syniyah Bourne MPT [facility] [place] #[phone]

## 2022-04-03 ENCOUNTER — Other Ambulatory Visit: Payer: Self-pay | Admitting: Internal Medicine

## 2022-04-03 DIAGNOSIS — G4733 Obstructive sleep apnea (adult) (pediatric): Secondary | ICD-10-CM | POA: Diagnosis not present

## 2022-04-04 ENCOUNTER — Encounter (HOSPITAL_COMMUNITY): Payer: Medicare HMO

## 2022-04-04 NOTE — Progress Notes (Unsigned)
Subjective:    Patient ID: Caleb Wolf, male    DOB: 10-Oct-1940, 81 y.o.   MRN: [id number]  HPI   Male former smoker followed for OSA, complicated by HBP, CAD/MI/CABG, carotid stenosis, osteoarthritis hip, GERD NPSG 07/2012  AHI 15/ hr, CPAP to 10 ONOX 03/25/22- on O2 2L w/ CPAP- O2 sat well maintained until end ofg night when he took it off. ==================================================================================    03/04/22-  81 year old male former smoker followed for OSA, complicated by HTN, CAD/MI/CABG/AFib/ Xarelto, carotid stenosis, osteoarthritis hip, GERD CPAP auto 5-20/Adapt O2 2L sleep/ [employer] Download-compliance 97%, AHI 15.4 ( mostly centrals) Body weight today 223 lbs Covid vax-3 Moderna Flu vax-pending from PCP LOV 8/1/23Volanda Napoleon, NP- Hosp f/u- recent sCHF hemoptysis on Xarelto, Possible BOOP or amiodarone effect after trip to [place]. Cardiology following.  Cardiology following actively. Download reviewed. Detailed discussion of central vs obstructive apnea and role of CPAP. He dozes off easily if sitting quietly in daytime. Consider change to BIPAP S/T if CSA is causing medically important desaturation on O2, or sleep disturbance. Wife notes he uses O2 only sporadically in daytime. Without it, room air O2 sat may drop into 80's with activity. Nocturia 3-5x/ night further disturbs sleep. Will update CXR for status of pleural effusion. CXR 01/29/22 IMPRESSION: 1. Mild right basilar atelectasis. 2. Small, stable right pleural effusion.  04/07/22-  81 year old male former smoker followed for OSA, Pleural Effusion, complicated by HTN, CAD/MI/CABG/AFib/ Xarelto, carotid stenosis, osteoarthritis hip, GERD CPAP auto 5-20/Adapt O2 2L sleep/ [employer] Download-compliance 100%, AHI 8.8/ hr   centrals> obst Body weight today-230 lbs Covid vax-3 Moderna                     Wife here Flu vax-had at PCP Discussed contributors to easy fatigue with exertion. Discussed overlap of  cardiac and pulmonary and deconditioning, with encouragement to walk driveway with rolling walker if he needs, to maintain some endurance. Download reviewed.. Fewer centrals. CXR reviewed. Discussed impact of (probably cardiogenic) effusion. Hopefully it can reabsorb without much peel and without thoracentesis. ONOX 03/25/22- on O2 2L w/ CPAP- O2 sat well maintained until end of night when he took it off. CXR 03/06/22- IMPRESSION: 1. No acute cardiopulmonary disease. 2. Small to moderate right pleural effusion. Mild, right greater than left, basilar atelectasis. No significant change when compared to the most recent prior study. No convincing pneumonia and no pulmonary edema.    ROS-see HPI + = positive Constitutional:    weight loss, night sweats, fevers, chills, f+atigue, lassitude. HEENT:    headaches, difficulty swallowing, tooth/dental problems, sore throat,       sneezing, itching, ear ache, nasal congestion, post nasal drip, snoring CV:    chest pain, orthopnea, PND, swelling in lower extremities, anasarca,                                                dizziness, palpitations Resp:   +shortness of breath with exertion or at rest.                productive cough,   non-productive cough, coughing up of blood.              change in color of mucus.  wheezing.   Skin:    rash or lesions. GI:  No-   heartburn, indigestion, abdominal pain,  nausea, vomiting, diarrhea,                 change in bowel habits, loss of appetite GU: dysuria, change in color of urine, no urgency or frequency.   flank pain. MS:   joint pain, stiffness, decreased range of motion, back pain. Neuro-     nothing unusual Psych:  change in mood or affect.  depression or anxiety.   memory loss.    Objective:  OBJ- Physical Exam                     +POC on 3L  O2 sat 94% General- Alert, Oriented, Affect-appropriate, Distress- none acute, + overweight Skin- rash-none, lesions- none, excoriation-  none Lymphadenopathy- none Head- atraumatic            Eyes- Gross vision intact, PERRLA, conjunctivae and secretions clear            Ears- Hearing, canals-normal            Nose- Clear, no-Septal dev, mucus, polyps, erosion, perforation             Throat- Mallampati III-IV, mucosa clear , drainage- none, tonsils- atrophic Neck- flexible , trachea midline, no stridor , thyroid nl, carotid no bruit Chest - symmetrical excursion , unlabored           Heart/CV- RR+today , no murmur , no gallop  , no rub, nl s1 s2                           - JVD- none , edema- none, stasis changes- none, varices- none           Lung- clear to P&A, wheeze- none, cough- none , +diminished in bases, rub- none           Chest wall-  Abd-  Br/ Gen/ Rectal- Not done, not indicated Extrem- +elastic hose L ankle Neuro- grossly intact to observation

## 2022-04-07 ENCOUNTER — Ambulatory Visit: Payer: Medicare HMO | Admitting: Internal Medicine

## 2022-04-07 ENCOUNTER — Encounter: Payer: Self-pay | Admitting: Internal Medicine

## 2022-04-07 VITALS — BP 132/66 | HR 59 | Ht 70.5 in | Wt 230.5 lb

## 2022-04-07 DIAGNOSIS — J9611 Chronic respiratory failure with hypoxia: Secondary | ICD-10-CM | POA: Diagnosis not present

## 2022-04-07 DIAGNOSIS — G4733 Obstructive sleep apnea (adult) (pediatric): Secondary | ICD-10-CM

## 2022-04-07 NOTE — Assessment & Plan Note (Signed)
Benefits from O2 for sleep. He has a harder time telling that POC helps his stamina. This indicates that hypoxia is not the reason for easy exertional fatigue and cardiac factors may be important. Plan- continue O2 2-3L through CPAP at night. O2 2L POC as needed

## 2022-04-07 NOTE — Assessment & Plan Note (Signed)
Benefits from CPAP Plan- continue auto 5-20

## 2022-04-07 NOTE — Patient Instructions (Addendum)
Ok to continue CPAP and oxygen as before  Try turning up the humidifier on your CPAP some to help with dry mouth  Walk as able to maintain endurance  Please call if we can help  Keep appointment in March

## 2022-04-08 ENCOUNTER — Encounter (HOSPITAL_COMMUNITY): Payer: Medicare HMO

## 2022-04-10 ENCOUNTER — Encounter (HOSPITAL_COMMUNITY): Payer: Medicare HMO

## 2022-04-15 ENCOUNTER — Encounter: Payer: Self-pay | Admitting: Internal Medicine

## 2022-04-17 ENCOUNTER — Other Ambulatory Visit (HOSPITAL_COMMUNITY): Payer: Self-pay | Admitting: Nurse Practitioner

## 2022-04-18 DIAGNOSIS — J9621 Acute and chronic respiratory failure with hypoxia: Secondary | ICD-10-CM | POA: Diagnosis not present

## 2022-04-18 DIAGNOSIS — I4891 Unspecified atrial fibrillation: Secondary | ICD-10-CM | POA: Diagnosis not present

## 2022-04-30 DIAGNOSIS — I10 Essential (primary) hypertension: Secondary | ICD-10-CM | POA: Diagnosis not present

## 2022-04-30 DIAGNOSIS — N1832 Chronic kidney disease, stage 3b: Secondary | ICD-10-CM | POA: Diagnosis not present

## 2022-04-30 DIAGNOSIS — I5022 Chronic systolic (congestive) heart failure: Secondary | ICD-10-CM | POA: Diagnosis not present

## 2022-04-30 DIAGNOSIS — E559 Vitamin D deficiency, unspecified: Secondary | ICD-10-CM | POA: Diagnosis not present

## 2022-04-30 DIAGNOSIS — Z79899 Other long term (current) drug therapy: Secondary | ICD-10-CM | POA: Diagnosis not present

## 2022-05-03 DIAGNOSIS — G4733 Obstructive sleep apnea (adult) (pediatric): Secondary | ICD-10-CM | POA: Diagnosis not present

## 2022-05-09 ENCOUNTER — Encounter: Payer: Self-pay | Admitting: Cardiology

## 2022-05-09 ENCOUNTER — Ambulatory Visit: Payer: Medicare HMO | Attending: Internal Medicine | Admitting: Cardiology

## 2022-05-09 VITALS — BP 130/60 | HR 62 | Ht 70.5 in | Wt 228.0 lb

## 2022-05-09 DIAGNOSIS — Z951 Presence of aortocoronary bypass graft: Secondary | ICD-10-CM | POA: Diagnosis not present

## 2022-05-09 DIAGNOSIS — I4819 Other persistent atrial fibrillation: Secondary | ICD-10-CM | POA: Diagnosis not present

## 2022-05-09 DIAGNOSIS — I2510 Atherosclerotic heart disease of native coronary artery without angina pectoris: Secondary | ICD-10-CM

## 2022-05-09 DIAGNOSIS — N1832 Chronic kidney disease, stage 3b: Secondary | ICD-10-CM

## 2022-05-09 DIAGNOSIS — I5020 Unspecified systolic (congestive) heart failure: Secondary | ICD-10-CM | POA: Diagnosis not present

## 2022-05-09 MED ORDER — FUROSEMIDE 40 MG PO TABS: 60.0000 mg | ORAL_TABLET | Freq: Two times a day (BID) | ORAL | 3 refills | Status: DC

## 2022-05-09 NOTE — Progress Notes (Signed)
Cardiology Office Note:    Date:  05/09/2022   ID:  Caleb Wolf, DOB 1941-02-16, MRN [id number]  PCP:  Caleb Noble, MD   [facility] Providers Cardiologist:  Caleb Furbish, MD Electrophysiologist:  Caleb Grayer, MD     Referring MD: Caleb Noble, MD    History of Present Illness:    Caleb Wolf is a 81 y.o. male here for follow-up chronic systolic heart failure, CABG 2015 pneumonia/Boop after trip to [place], recurrent atrial fibrillation post ablation and cardioversion in 06/2021 continues to take amiodarone 200 mg a day.  He has had increased abdominal protuberance as well as increased lower extremity edema on 40 mg of Lasix with occasional 60 mg of Lasix interspersed.  Still short of breath.  Not doing well with balancing exercise.  In review of prior note-he went on a cruise to [place] for his 52nd wedding anniversary.  Enjoyed.  Toward the end of the cruise was getting more and more short of breath.  Ended up developing heart failure as well as pneumonia.  Came into the hospital for further evaluation.   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  Had pneumonia/Boop.  He was diuresed with assistance of Reds vest.  Weight went down to 225 pounds with 6 days of diuresis. Now is 216.  His losartan 50 mg and Jardiance 10 mg was held because of GFR 30.  Minimally elevated troponin was not consistent with ACS.  He is not on aspirin because of Xarelto.  He was in sinus rhythm with ablation in 2019 and repeat in July 2022.  He remains on amiodarone.  Cardizem was switched over to Toprol because of cardiomyopathy.  His Xarelto as well was on hold because of hemoptysis.  He was placed on Xarelto 15 mg because of renal function.  Smart watch, Apple, HR  fell below 40 during sleep asymptomatic.Marland Kitchen     Past Medical History:  Diagnosis Date   AAA (abdominal aortic aneurysm) (HCC)    Arthritis    Basal cell carcinoma    Carotid stenosis    Chronic kidney disease, stage 3b ([place]) 12/13/2021   Coronary artery disease    Multvessel s/p CABG 2015   Enlarged prostate    Essential hypertension    GERD (gastroesophageal reflux disease)    Gout    History of colon polyps    History of kidney stones    Hyperlipidemia    Lumbar disc disease    Persistent atrial fibrillation (HCC)    Sleep apnea    CPAP    Past Surgical History:  Procedure Laterality Date   ABLATION OF DYSRHYTHMIC FOCUS  07/28/2017   ATRIAL FIBRILLATION ABLATION N/A 07/28/2017   Procedure: ATRIAL FIBRILLATION ABLATION;  Surgeon: Caleb Grayer, MD;  Location: [place] [facility];  Service: Cardiovascular;  Laterality: N/A;   ATRIAL FIBRILLATION ABLATION N/A 12/04/2020   Procedure: ATRIAL FIBRILLATION ABLATION;  Surgeon: Caleb Grayer, MD;  Location: [place] [facility];  Service: Cardiovascular;  Laterality: N/A;   BACK SURGERY     CARDIAC CATHETERIZATION  05/22/2014   Procedure: IABP INSERTION;  Surgeon: Leonie Man, MD;  Location: [facility] CATH LAB;  Service: Cardiovascular;;   CARDIOVERSION N/A 04/29/2017   Procedure: CARDIOVERSION;  Surgeon: Satira Sark, MD;  [facility];  Service: Cardiovascular;  Laterality: N/A;   CARDIOVERSION N/A 08/13/2017   Procedure: CARDIOVERSION;  Surgeon: Pixie Casino, MD;  Location: [place];  Service: Cardiovascular;  Laterality: N/A;   CARDIOVERSION N/A 08/15/2019   Procedure: CARDIOVERSION;  Surgeon: Dorothy Spark, MD;  Location: [facility] ENDOSCOPY;  Service: Cardiovascular;  Laterality: N/A;   CARDIOVERSION N/A 03/09/2020   Procedure: CARDIOVERSION;  Surgeon: Buford Dresser, MD;  Location: [place];  Service: Cardiovascular;  Laterality: N/A;   CARDIOVERSION N/A 07/06/2020   Procedure: CARDIOVERSION;  Surgeon:  Sanda Klein, MD;  Location: [place];  Service: Cardiovascular;  Laterality: N/A;   CARDIOVERSION N/A 01/18/2021   Procedure: CARDIOVERSION;  Surgeon: Skeet Latch, MD;  Location: [facility] ENDOSCOPY;  Service: Cardiovascular;  Laterality: N/A;   CARDIOVERSION N/A 06/17/2021   Procedure: CARDIOVERSION;  Surgeon: Geralynn Rile, MD;  Location: [place];  Service: Cardiovascular;  Laterality: N/A;   Cataract surgery Right    COLONOSCOPY     COLONOSCOPY N/A 12/30/2017   Procedure: COLONOSCOPY;  Surgeon: Rogene Houston, MD;  [facility];  Service: Endoscopy;  Laterality: N/A;   CORONARY ARTERY BYPASS GRAFT N/A 05/22/2014   Procedure: CORONARY ARTERY BYPASS GRAFTING (CABG) times three using left internal mammary and right saphenous vein.;  Surgeon: Melrose Nakayama, MD;  Location: [place];  Service: Open Heart Surgery;  Laterality: N/A;   ENDARTERECTOMY Left 02/17/2014   Procedure: ENDARTERECTOMY CAROTID WITH PATCH ANGIOPLASTY;  Surgeon: Mal Misty, MD;  Location: [place];  Service: Vascular;  Laterality: Left;   ESOPHAGEAL DILATION N/A 08/22/2015   Procedure: ESOPHAGEAL DILATION;  Surgeon: Rogene Houston, MD;  [facility];  Service: Endoscopy;  Laterality: N/A;   ESOPHAGOGASTRODUODENOSCOPY N/A 08/22/2015   Procedure: ESOPHAGOGASTRODUODENOSCOPY (EGD);  Surgeon: Rogene Houston, MD;  [facility];  Service: Endoscopy;  Laterality: N/A;  12:45 - moved to 1:55 - Ann notified pt   ESOPHAGOGASTRODUODENOSCOPY N/A 12/30/2017   Procedure: ESOPHAGOGASTRODUODENOSCOPY (EGD);  Surgeon: Rogene Houston, MD;  [facility];  Service: Endoscopy;  Laterality: N/A;  200   EYE SURGERY     cataract extraction (right) with repair macular tear , with IOL     right   FRACTURE SURGERY     bilateral wrist fractures- one ORIF   JOINT REPLACEMENT  2011   left knee   LEFT HEART CATHETERIZATION WITH CORONARY ANGIOGRAM N/A 05/22/2014   Procedure: LEFT HEART  CATHETERIZATION WITH CORONARY ANGIOGRAM;  Surgeon: Leonie Man, MD;  Location: [facility] CATH LAB;  Service: Cardiovascular;  Laterality: N/A;   LITHOTRIPSY     PARS PLANA VITRECTOMY W/ REPAIR OF MACULAR HOLE     RHINOPLASTY     TEE WITHOUT CARDIOVERSION N/A 04/29/2017   Procedure: TRANSESOPHAGEAL ECHOCARDIOGRAM (TEE) WITH PROPOFOL;  Surgeon: Satira Sark, MD;  [facility];  Service: Cardiovascular;  Laterality: N/A;   TONSILLECTOMY     TOTAL HIP ARTHROPLASTY  12/08/2011   Procedure: TOTAL HIP ARTHROPLASTY;  Surgeon: Gearlean Alf, MD;  Location: [facility];  Service: Orthopedics;  Laterality: Right;   UPPER GI ENDOSCOPY  12/18/2015   Procedure: UPPER GI ENDOSCOPY;  Surgeon: Ralene Ok, MD;  Location: [facility];  Service: General;;   WRIST SURGERY Right 26yr ago   WRIST SURGERY Left     Current Medications: Current Meds  Medication Sig   acetaminophen (TYLENOL) 500 MG tablet Take 1,000 mg by mouth every 6 (six) hours as needed for  moderate pain.   amiodarone (PACERONE) 200 MG tablet TAKE 1 TABLET BY MOUTH EVERY DAY   amoxicillin (AMOXIL) 500 MG capsule Take 2,000 mg by mouth See admin instructions. Take 4 capsules (2000 mg) by mouth before dental procedures   atorvastatin (LIPITOR) 80 MG tablet TAKE 1 TABLET BY MOUTH EVERY DAY IN THE EVENING   colchicine 0.6 MG tablet Take 0.6 mg by mouth daily as needed (for gout flare up).   doxazosin (CARDURA) 4 MG tablet Take 4 mg by mouth in the morning.   finasteride (PROSCAR) 5 MG tablet Take 5 mg by mouth every evening.    fluticasone (FLONASE) 50 MCG/ACT nasal spray Place 1 spray into both nostrils daily as needed for allergies or rhinitis.   furosemide (LASIX) 40 MG tablet Take 1.5 tablets (60 mg total) by mouth 2 (two) times daily.   lisinopril (ZESTRIL) 40 MG tablet Take 40 mg by mouth daily.   metoprolol succinate (TOPROL-XL) 25 MG 24 hr tablet Take 1 tablet (25 mg total) by mouth daily.   nitroGLYCERIN (NITROSTAT) 0.4 MG SL  tablet Place 1 tablet (0.4 mg total) under the tongue every 5 (five) minutes x 3 doses as needed for chest pain.   pantoprazole (PROTONIX) 40 MG tablet Take 1 tablet (40 mg total) by mouth daily before breakfast.   Rivaroxaban (XARELTO) 15 MG TABS tablet Take 1 tablet (15 mg total) by mouth daily with supper.   [DISCONTINUED] furosemide (LASIX) 40 MG tablet Take 40 mg (1 Tablet) Daily Alternating with 60 mg ( 1 1/2 Tablet) Daily     Allergies:   Codeine sulfate   Social History   Socioeconomic History   Marital status: Married    Spouse name: Not on file   Number of children: Not on file   Years of education: Not on file   Highest education level: Not on file  Occupational History   Occupation: retired    Comment: [employer]  Tobacco Use   Smoking status: Former    Packs/day: 1.50    Years: 25.00    Total pack years: 37.50    Types: Cigarettes    Start date: 08/08/1960    Quit date: 06/02/1982    Years since quitting: 39.9   Smokeless tobacco: Never   Tobacco comments:    quit smoking 30+yrs ago  Vaping Use   Vaping Use: Never used  Substance and Sexual Activity   Alcohol use: No    Alcohol/week: 0.0 standard drinks of alcohol    Comment: occasionally    Drug use: No   Sexual activity: Yes  Other Topics Concern   Not on file  Social History Narrative   Not on file   Social Determinants of Health   Financial Resource Strain: Not on file  Food Insecurity: Not on file  Transportation Needs: Not on file  Physical Activity: Not on file  Stress: Not on file  Social Connections: Not on file     Family History: The patient's family history includes Allergies in his mother; Heart disease in his father and mother; Hypertension in his mother.  ROS:   Please see the history of present illness.     All other systems reviewed and are negative.  EKGs/Labs/Other Studies Reviewed:    The following studies were reviewed today: Hospital records reviewed lab work  echo   Recent Labs: 06/25/2021: TSH 3.640 12/11/2021: ALT 24 12/13/2021: Hemoglobin 8.8; Platelets 279 12/15/2021: Magnesium 2.4 12/31/2021: Pro B Natriuretic peptide (BNP) 829.0 03/21/2022:  B Natriuretic Peptide 847.0; BUN 24; Creatinine, Ser 1.96; Potassium 4.7; Sodium 142  Recent Lipid Panel    Component Value Date/Time   CHOL 134 03/22/2015 0816   TRIG 92 03/22/2015 0816   HDL 41 03/22/2015 0816   CHOLHDL 3.3 03/22/2015 0816   VLDL 18 03/22/2015 0816   LDLCALC 75 03/22/2015 0816     Risk Assessment/Calculations:              Physical Exam:    VS:  BP 130/60 (BP Location: Left Arm, Patient Position: Sitting, Cuff Size: Normal)   Pulse 62   Ht 5' 10.5" (1.791 m)   Wt 228 lb (103.4 kg)   SpO2 96%   BMI 32.25 kg/m     Wt Readings from Last 3 Encounters:  05/09/22 228 lb (103.4 kg)  04/07/22 230 lb 8 oz (104.6 kg)  03/04/22 223 lb 6.4 oz (101.3 kg)     General-alert and orient x 3 here with his wife On nasal cannula oxygen Mildly decreased breath sounds right base Regular rate and rhythm 3+ lower extremity edema ASSESSMENT:    1. HFrEF (heart failure with reduced ejection fraction) ([place])   2. Coronary artery disease involving native coronary artery of native heart without angina pectoris   3. S/P CABG x 3   4. Stage 3b chronic kidney disease (HCC)   5. Persistent atrial fibrillation (HCC)     PLAN:    In order of problems listed above:  Chronic systolic heart failure with mildly reduced ejection fraction EF 45% - We are going to increase his Lasix to 60 mg twice a day.  He is clearly volume overloaded.  Continue to monitor his creatinine.  We will check at clinic follow-up.  Protuberant abdomen.  Toprol.  Continue with 25 mg a day.  Discussed with wife as well.  Continuing with amiodarone 200 mg a day for rhythm control.  No ARN I because of creatinine.  Coronary artery disease status post CABG 2015 - See anatomy as above.  Doing well without any anginal  symptoms.  Minimally elevated troponin was acute myocardial injury in the setting of pneumonia and heart failure.  No anginal symptoms.  Paroxysmal atrial fibrillation - 2 ablations 2019 2022 [name].  Cardioversion 06/2021. - Continue with amiodarone 200 mg a day.  Continue with close monitoring of LFTs TSH lung studies.  Recently saw pulmonary  Chronic anticoagulation - Continue with Xarelto dose adjusted 15 mg because of GFR.  He states that expense of the medication is tough.  Chronic kidney disease stage IIIb - Creatinine 1.9-2.1.  We have dose adjusted medications as above.  Recent pneumonia/Boop - Following cruise to [place].  Wearing supplemental oxygen currently.  Monitored by primary team, Dr. Annamaria Boots.  Close follow-up in 1 month.      Medication Adjustments/Labs and Tests Ordered: Current medicines are reviewed at length with the patient today.  Concerns regarding medicines are outlined above.  No orders of the defined types were placed in this encounter.  Meds ordered this encounter  Medications   furosemide (LASIX) 40 MG tablet    Sig: Take 1.5 tablets (60 mg total) by mouth 2 (two) times daily.    Dispense:  90 tablet    Refill:  3    Patient Instructions  Medication Instructions:  Please increase your Furosemide to 60 mg twice a day. Continue all other medications as listed.  *If you need a refill on your cardiac medications before your next appointment, please  call your pharmacy*  Follow-Up: At [facility], you and your health needs are our priority.  As part of our continuing mission to provide you with exceptional heart care, we have created designated Provider Care Teams.  These Care Teams include your primary Cardiologist (physician) and Advanced Practice Providers (APPs -  Physician Assistants and Nurse Practitioners) who all work together to provide you with the care you need, when you need it.  We recommend signing up for the patient portal  called "MyChart".  Sign up information is provided on this After Visit Summary.  MyChart is used to connect with patients for Virtual Visits (Telemedicine).  Patients are able to view lab/test results, encounter notes, upcoming appointments, etc.  Non-urgent messages can be sent to your provider as well.   To learn more about what you can do with MyChart, go to [email or web address].    Your next appointment:   1 month(s)  The format for your next appointment:   In Person  Provider:   Candee Furbish, MD      Important Information About Sugar         Signed, Caleb Furbish, MD  05/09/2022 1:53 PM    [place]

## 2022-05-09 NOTE — Patient Instructions (Signed)
Medication Instructions:  Please increase your Furosemide to 60 mg twice a day. Continue all other medications as listed.  *If you need a refill on your cardiac medications before your next appointment, please call your pharmacy*  Follow-Up: At [facility], you and your health needs are our priority.  As part of our continuing mission to provide you with exceptional heart care, we have created designated Provider Care Teams.  These Care Teams include your primary Cardiologist (physician) and Advanced Practice Providers (APPs -  Physician Assistants and Nurse Practitioners) who all work together to provide you with the care you need, when you need it.  We recommend signing up for the patient portal called "MyChart".  Sign up information is provided on this After Visit Summary.  MyChart is used to connect with patients for Virtual Visits (Telemedicine).  Patients are able to view lab/test results, encounter notes, upcoming appointments, etc.  Non-urgent messages can be sent to your provider as well.   To learn more about what you can do with MyChart, go to [email or web address].    Your next appointment:   1 month(s)  The format for your next appointment:   In Person  Provider:   Candee Furbish, MD      Important Information About Sugar

## 2022-05-14 DIAGNOSIS — I5022 Chronic systolic (congestive) heart failure: Secondary | ICD-10-CM | POA: Diagnosis not present

## 2022-05-14 DIAGNOSIS — M4800 Spinal stenosis, site unspecified: Secondary | ICD-10-CM | POA: Diagnosis not present

## 2022-05-14 DIAGNOSIS — I4821 Permanent atrial fibrillation: Secondary | ICD-10-CM | POA: Diagnosis not present

## 2022-05-18 DIAGNOSIS — I4891 Unspecified atrial fibrillation: Secondary | ICD-10-CM | POA: Diagnosis not present

## 2022-05-18 DIAGNOSIS — J9621 Acute and chronic respiratory failure with hypoxia: Secondary | ICD-10-CM | POA: Diagnosis not present

## 2022-05-27 ENCOUNTER — Other Ambulatory Visit: Payer: Self-pay | Admitting: *Deleted

## 2022-05-27 DIAGNOSIS — I6522 Occlusion and stenosis of left carotid artery: Secondary | ICD-10-CM

## 2022-05-27 DIAGNOSIS — I6521 Occlusion and stenosis of right carotid artery: Secondary | ICD-10-CM

## 2022-06-03 DIAGNOSIS — G4733 Obstructive sleep apnea (adult) (pediatric): Secondary | ICD-10-CM | POA: Diagnosis not present

## 2022-06-09 NOTE — Progress Notes (Signed)
HISTORY AND PHYSICAL     CC:  follow up. Requesting Provider:  Asencion Noble, MD  HPI: This is a 82 y.o. male here for follow up for carotid artery stenosis.  Pt is s/p left CEA/CABG for asymptomatic carotid artery stenosis on 02/17/2014 by Dr. Kellie Simmering.   He had a resection of a redundant segment of the left internal carotid artery with primary reanastomosis.   He has a known right ICA occlusion.     Pt was last seen 04/19/2021 and at that time he was not having any neurological symptoms.  Pt duplex stable and was scheduled for one year follow up.  He was onXarelto for afib.     Pt returns today for follow up.    Pt *** any amaurosis fugax, speech difficulties, weakness, numbness, paralysis or clumsiness or facial droop.    ***  The pt is on a statin for cholesterol management.  The pt is not on a daily aspirin.   Other AC:  Xarelto The pt is on ACEI, BB, Diuretic for hypertension.   The pt does not have diabetes Tobacco hx:  former  Pt does *** have family hx of AAA.  Past Medical History:  Diagnosis Date   AAA (abdominal aortic aneurysm) (HCC)    Arthritis    Basal cell carcinoma    Carotid stenosis    Chronic kidney disease, stage 3b ([place]) 12/13/2021   Coronary artery disease    Multvessel s/p CABG 2015   Enlarged prostate    Essential hypertension    GERD (gastroesophageal reflux disease)    Gout    History of colon polyps    History of kidney stones    Hyperlipidemia    Lumbar disc disease    Persistent atrial fibrillation (HCC)    Sleep apnea    CPAP    Past Surgical History:  Procedure Laterality Date   ABLATION OF DYSRHYTHMIC FOCUS  07/28/2017   ATRIAL FIBRILLATION ABLATION N/A 07/28/2017   Procedure: ATRIAL FIBRILLATION ABLATION;  Surgeon: Thompson Grayer, MD;  Location: [place] [facility];  Service: Cardiovascular;  Laterality: N/A;   ATRIAL FIBRILLATION ABLATION N/A 12/04/2020   Procedure: ATRIAL FIBRILLATION ABLATION;  Surgeon: Thompson Grayer, MD;  Location: [place] [facility];  Service: Cardiovascular;  Laterality: N/A;   BACK SURGERY     CARDIAC CATHETERIZATION  05/22/2014   Procedure: IABP INSERTION;  Surgeon: Leonie Man, MD;  Location: [facility] CATH LAB;  Service: Cardiovascular;;   CARDIOVERSION N/A 04/29/2017   Procedure: CARDIOVERSION;  Surgeon: Satira Sark, MD;  [facility];  Service: Cardiovascular;  Laterality: N/A;   CARDIOVERSION N/A 08/13/2017   Procedure: CARDIOVERSION;  Surgeon: Pixie Casino, MD;  Location: [place];  Service: Cardiovascular;  Laterality: N/A;   CARDIOVERSION N/A 08/15/2019   Procedure: CARDIOVERSION;  Surgeon: Dorothy Spark, MD;  Location: [facility] ENDOSCOPY;  Service: Cardiovascular;  Laterality: N/A;   CARDIOVERSION N/A 03/09/2020   Procedure: CARDIOVERSION;  Surgeon: Buford Dresser, MD;  Location: [place];  Service: Cardiovascular;  Laterality: N/A;   CARDIOVERSION N/A 07/06/2020   Procedure: CARDIOVERSION;  Surgeon: Sanda Klein, MD;  Location: [place];  Service: Cardiovascular;  Laterality: N/A;   CARDIOVERSION N/A 01/18/2021   Procedure: CARDIOVERSION;  Surgeon: Skeet Latch, MD;  Location: [facility] ENDOSCOPY;  Service: Cardiovascular;  Laterality: N/A;   CARDIOVERSION N/A 06/17/2021   Procedure: CARDIOVERSION;  Surgeon: Geralynn Rile, MD;  Location: [place];  Service: Cardiovascular;  Laterality: N/A;   Cataract surgery  Right    COLONOSCOPY     COLONOSCOPY N/A 12/30/2017   Procedure: COLONOSCOPY;  Surgeon: Rogene Houston, MD;  [facility];  Service: Endoscopy;  Laterality: N/A;   CORONARY ARTERY BYPASS GRAFT N/A 05/22/2014   Procedure: CORONARY ARTERY BYPASS GRAFTING (CABG) times three using left internal mammary and right saphenous vein.;  Surgeon: Melrose Nakayama, MD;  Location: [place];  Service: Open Heart Surgery;  Laterality: N/A;   ENDARTERECTOMY Left 02/17/2014   Procedure: ENDARTERECTOMY CAROTID WITH PATCH ANGIOPLASTY;  Surgeon: Mal Misty, MD;  Location: [place];  Service: Vascular;  Laterality: Left;   ESOPHAGEAL DILATION N/A 08/22/2015   Procedure: ESOPHAGEAL DILATION;  Surgeon: Rogene Houston, MD;  [facility];  Service: Endoscopy;  Laterality: N/A;   ESOPHAGOGASTRODUODENOSCOPY N/A 08/22/2015   Procedure: ESOPHAGOGASTRODUODENOSCOPY (EGD);  Surgeon: Rogene Houston, MD;  [facility];  Service: Endoscopy;  Laterality: N/A;  12:45 - moved to 1:55 - Ann notified pt   ESOPHAGOGASTRODUODENOSCOPY N/A 12/30/2017   Procedure: ESOPHAGOGASTRODUODENOSCOPY (EGD);  Surgeon: Rogene Houston, MD;  [facility];  Service: Endoscopy;  Laterality: N/A;  200   EYE SURGERY     cataract extraction (right) with repair macular tear , with IOL     right   FRACTURE SURGERY     bilateral wrist fractures- one ORIF   JOINT REPLACEMENT  2011   left knee   LEFT HEART CATHETERIZATION WITH CORONARY ANGIOGRAM N/A 05/22/2014   Procedure: LEFT HEART CATHETERIZATION WITH CORONARY ANGIOGRAM;  Surgeon: Leonie Man, MD;  Location: [facility] LAB;  Service: Cardiovascular;  Laterality: N/A;   LITHOTRIPSY     PARS PLANA VITRECTOMY W/ REPAIR OF MACULAR HOLE     RHINOPLASTY     TEE WITHOUT CARDIOVERSION N/A 04/29/2017   Procedure: TRANSESOPHAGEAL ECHOCARDIOGRAM (TEE) WITH PROPOFOL;  Surgeon: Satira Sark, MD;  [facility];  Service: Cardiovascular;  Laterality: N/A;   TONSILLECTOMY     TOTAL HIP ARTHROPLASTY  12/08/2011   Procedure: TOTAL HIP ARTHROPLASTY;  Surgeon: Gearlean Alf, MD;  Location: [facility];  Service: Orthopedics;  Laterality: Right;   UPPER GI ENDOSCOPY  12/18/2015   Procedure: UPPER GI ENDOSCOPY;  Surgeon: Ralene Ok, MD;  Location: [facility];  Service: General;;   WRIST SURGERY Right 56yr ago   WRIST SURGERY Left     Allergies  Allergen Reactions   Codeine Sulfate Nausea Only    Current Outpatient Medications  Medication Sig Dispense Refill   acetaminophen (TYLENOL) 500 MG tablet  Take 1,000 mg by mouth every 6 (six) hours as needed for moderate pain.     amiodarone (PACERONE) 200 MG tablet TAKE 1 TABLET BY MOUTH EVERY DAY 90 tablet 1   amoxicillin (AMOXIL) 500 MG capsule Take 2,000 mg by mouth See admin instructions. Take 4 capsules (2000 mg) by mouth before dental procedures     atorvastatin (LIPITOR) 80 MG tablet TAKE 1 TABLET BY MOUTH EVERY DAY IN THE EVENING 90 tablet 3   colchicine 0.6 MG tablet Take 0.6 mg by mouth daily as needed (for gout flare up).     doxazosin (CARDURA) 4 MG tablet Take 4 mg by mouth in the morning.  4   finasteride (PROSCAR) 5 MG tablet Take 5 mg by mouth every evening.      fluticasone (FLONASE) 50 MCG/ACT nasal spray Place 1 spray into both nostrils daily as needed for allergies or rhinitis.  furosemide (LASIX) 40 MG tablet Take 1.5 tablets (60 mg total) by mouth 2 (two) times daily. 90 tablet 3   lisinopril (ZESTRIL) 40 MG tablet Take 40 mg by mouth daily.     metoprolol succinate (TOPROL-XL) 25 MG 24 hr tablet Take 1 tablet (25 mg total) by mouth daily. 90 tablet 3   nitroGLYCERIN (NITROSTAT) 0.4 MG SL tablet Place 1 tablet (0.4 mg total) under the tongue every 5 (five) minutes x 3 doses as needed for chest pain. 75 tablet 3   pantoprazole (PROTONIX) 40 MG tablet Take 1 tablet (40 mg total) by mouth daily before breakfast. 90 tablet 3   Rivaroxaban (XARELTO) 15 MG TABS tablet Take 1 tablet (15 mg total) by mouth daily with supper. 30 tablet 1   No current facility-administered medications for this visit.    Family History  Problem Relation Age of Onset   Allergies Mother    Heart disease Mother    Hypertension Mother    Heart disease Father        MI    Social History   Socioeconomic History   Marital status: Married    Spouse name: Not on file   Number of children: Not on file   Years of education: Not on file   Highest education level: Not on file  Occupational History   Occupation: retired    Comment: Optometrist  [employer]  Tobacco Use   Smoking status: Former    Packs/day: 1.50    Years: 25.00    Total pack years: 37.50    Types: Cigarettes    Start date: 08/08/1960    Quit date: 06/02/1982    Years since quitting: 40.0   Smokeless tobacco: Never   Tobacco comments:    quit smoking 30+yrs ago  Vaping Use   Vaping Use: Never used  Substance and Sexual Activity   Alcohol use: No    Alcohol/week: 0.0 standard drinks of alcohol    Comment: occasionally    Drug use: No   Sexual activity: Yes  Other Topics Concern   Not on file  Social History Narrative   Not on file   Social Determinants of Health   Financial Resource Strain: Not on file  Food Insecurity: Not on file  Transportation Needs: Not on file  Physical Activity: Not on file  Stress: Not on file  Social Connections: Not on file  Intimate Partner Violence: Not on file     REVIEW OF SYSTEMS:  *** '[X]'$  denotes positive finding, '[ ]'$  denotes negative finding Cardiac  Comments:  Chest pain or chest pressure:    Shortness of breath upon exertion:    Short of breath when lying flat:    Irregular heart rhythm:        Vascular    Pain in calf, thigh, or hip brought on by ambulation:    Pain in feet at night that wakes you up from your sleep:     Blood clot in your veins:    Leg swelling:         Pulmonary    Oxygen at home:    Productive cough:     Wheezing:         Neurologic    Sudden weakness in arms or legs:     Sudden numbness in arms or legs:     Sudden onset of difficulty speaking or slurred speech:    Temporary loss of vision in one eye:     Problems with  dizziness:         Gastrointestinal    Blood in stool:     Vomited blood:         Genitourinary    Burning when urinating:     Blood in urine:        Psychiatric    Major depression:         Hematologic    Bleeding problems:    Problems with blood clotting too easily:        Skin    Rashes or ulcers:        Constitutional    Fever or  chills:      PHYSICAL EXAMINATION:  ***  General:  WDWN in NAD; vital signs documented above Gait: Not observed HENT: WNL, normocephalic Pulmonary: normal non-labored breathing Cardiac: {Desc; regular/irreg:14544} HR, {With/Without:20273} carotid bruit*** Abdomen: soft, NT; aortic pulse is *** palpable Skin: {With/Without:20273} rashes Vascular Exam/Pulses:  Right Left  Radial {Exam; arterial pulse strength 0-4:30167} {Exam; arterial pulse strength 0-4:30167}  Popliteal {Exam; arterial pulse strength 0-4:30167} {Exam; arterial pulse strength 0-4:30167}  DP {Exam; arterial pulse strength 0-4:30167} {Exam; arterial pulse strength 0-4:30167}  PT {Exam; arterial pulse strength 0-4:30167} {Exam; arterial pulse strength 0-4:30167}   Extremities: {With/Without:20273} open wounds Musculoskeletal: no muscle wasting or atrophy  Neurologic: A&O X 3; moving all extremities equally; speech is fluent/normal Psychiatric:  The pt has {Desc; normal/abnormal:11317::"Normal"} affect.   Non-Invasive Vascular Imaging:   Carotid Duplex on 06/10/2022 Right:  ***% ICA stenosis Left:  ***% ICA stenosis ***  Previous Carotid duplex on 04/19/2021: Right: occluded Left:   1-39% ICA stenosis    ASSESSMENT/PLAN:: 82 y.o. male here for follow up carotid artery stenosis and left CEA/CABG for asymptomatic carotid artery stenosis on 02/17/2014 by Dr. Kellie Simmering.   He had a resection of a redundant segment of the left internal carotid artery with primary reanastomosis.   He has a known right ICA occlusion.    -duplex today reveals *** -discussed s/s of stroke with pt and he understands should he develop any of these sx, he will go to the nearest ER or call 911. -pt will f/u in *** with carotid duplex -pt will call sooner should he have any issues. -continue statin/Xarelto   Leontine Locket, [facility] [phone]  Clinic MD:  Carlis Abbott

## 2022-06-10 ENCOUNTER — Ambulatory Visit: Payer: Medicare HMO | Admitting: Physician Assistant

## 2022-06-10 ENCOUNTER — Ambulatory Visit (HOSPITAL_COMMUNITY)
Admission: RE | Admit: 2022-06-10 | Discharge: 2022-06-10 | Disposition: A | Discharge status: Home / Self Care | Payer: Medicare HMO | Source: Ambulatory Visit | Attending: Vascular Surgery | Admitting: Vascular Surgery

## 2022-06-10 VITALS — BP 140/63 | HR 53 | Temp 98.4°F | Resp 20 | Ht 70.5 in | Wt 229.7 lb

## 2022-06-10 DIAGNOSIS — I6522 Occlusion and stenosis of left carotid artery: Secondary | ICD-10-CM

## 2022-06-10 DIAGNOSIS — I6521 Occlusion and stenosis of right carotid artery: Secondary | ICD-10-CM

## 2022-06-11 ENCOUNTER — Encounter: Payer: Self-pay | Admitting: Cardiology

## 2022-06-11 ENCOUNTER — Ambulatory Visit: Payer: Medicare HMO | Attending: Internal Medicine | Admitting: Cardiology

## 2022-06-11 VITALS — BP 130/56 | HR 58 | Ht 70.5 in | Wt 232.0 lb

## 2022-06-11 DIAGNOSIS — I10 Essential (primary) hypertension: Secondary | ICD-10-CM | POA: Diagnosis not present

## 2022-06-11 DIAGNOSIS — N1832 Chronic kidney disease, stage 3b: Secondary | ICD-10-CM | POA: Insufficient documentation

## 2022-06-11 DIAGNOSIS — I4819 Other persistent atrial fibrillation: Secondary | ICD-10-CM | POA: Insufficient documentation

## 2022-06-11 DIAGNOSIS — I2510 Atherosclerotic heart disease of native coronary artery without angina pectoris: Secondary | ICD-10-CM | POA: Diagnosis not present

## 2022-06-11 DIAGNOSIS — I5022 Chronic systolic (congestive) heart failure: Secondary | ICD-10-CM | POA: Insufficient documentation

## 2022-06-11 MED ORDER — FUROSEMIDE 80 MG PO TABS: 80.0000 mg | ORAL_TABLET | Freq: Two times a day (BID) | ORAL | 3 refills | Status: DC

## 2022-06-11 MED ORDER — EMPAGLIFLOZIN 10 MG PO TABS: 10.0000 mg | ORAL_TABLET | Freq: Every day | ORAL | 11 refills | Status: DC

## 2022-06-11 NOTE — Patient Instructions (Addendum)
Medication Instructions:  1.Start Jardiance 10 mg daily before breakfast 2.Increase furosemide (Lasix) to 80 mg twice daily *If you need a refill on your cardiac medications before your next appointment, please call your pharmacy*   Lab Work: BMET in 1 week, 06/18/2022 If you have labs (blood work) drawn today and your tests are completely normal, you will receive your results only by: [place] (if you have MyChart) OR A paper copy in the mail If you have any lab test that is abnormal or we need to change your treatment, we will call you to review the results.   Follow-Up: At [facility], you and your health needs are our priority.  As part of our continuing mission to provide you with exceptional heart care, we have created designated Provider Care Teams.  These Care Teams include your primary Cardiologist (physician) and Advanced Practice Providers (APPs -  Physician Assistants and Nurse Practitioners) who all work together to provide you with the care you need, when you need it.  Your next appointment:   06/18/2022 at 10:00 AM  The format for your next appointment:   In Person  Provider:   Candee Furbish, MD    Important Information About Sugar

## 2022-06-11 NOTE — Progress Notes (Signed)
Cardiology Office Note:    Date:  06/11/2022   ID:  Caleb Wolf, DOB December 12, 1940, MRN [id number]  PCP:  Asencion Noble, MD   [facility] Providers Cardiologist:  Candee Furbish, MD Electrophysiologist:  Thompson Grayer, MD     Referring MD: Asencion Noble, MD    History of Present Illness:    Caleb Wolf is a 82 y.o. male here for follow-up chronic systolic heart failure, CABG 2015 pneumonia/Boop after trip to [place], recurrent atrial fibrillation post ablation and cardioversion in 06/2021 continues to take amiodarone 200 mg a day.    Continues to struggle with increased weight gain, fluid gain especially in his lower extremities and abdomen.   Still short of breath.  Not doing well with balancing exercise.  In review of prior note-he went on a cruise to [place] for his 52nd wedding anniversary.  Enjoyed.  Toward the end of the cruise was getting more and more short of breath.  Ended up developing heart failure as well as pneumonia.  Came into the hospital for further evaluation.   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  Had pneumonia/Boop.  He was diuresed with assistance of Reds vest.  Weight went down to 225 pounds with 6 days of diuresis. Now is 216.  His losartan 50 mg and Jardiance 10 mg was held because of GFR 30.  Minimally elevated troponin was not consistent with ACS.  He is not on aspirin because of Xarelto.  He was in sinus rhythm with ablation in 2019 and repeat in July 2022.  He remains on amiodarone.  Cardizem was switched over to Toprol because of cardiomyopathy.  His Xarelto as well was on hold because of hemoptysis.  He was placed on Xarelto 15 mg because of renal function.  Smart watch, Apple, HR fell below 40 during sleep  asymptomatic.Marland Kitchen     Past Medical History:  Diagnosis Date   AAA (abdominal aortic aneurysm) (HCC)    Arthritis    Basal cell carcinoma    Carotid stenosis    Chronic kidney disease, stage 3b ([place]) 12/13/2021   Coronary artery disease    Multvessel s/p CABG 2015   Enlarged prostate    Essential hypertension    GERD (gastroesophageal reflux disease)    Gout    History of colon polyps    History of kidney stones    Hyperlipidemia    Lumbar disc disease    Persistent atrial fibrillation (HCC)    Sleep apnea    CPAP    Past Surgical History:  Procedure Laterality Date   ABLATION OF DYSRHYTHMIC FOCUS  07/28/2017   ATRIAL FIBRILLATION ABLATION N/A 07/28/2017   Procedure: ATRIAL FIBRILLATION ABLATION;  Surgeon: Thompson Grayer, MD;  Location: [place] [facility];  Service: Cardiovascular;  Laterality: N/A;   ATRIAL FIBRILLATION ABLATION N/A 12/04/2020   Procedure: ATRIAL FIBRILLATION ABLATION;  Surgeon: Thompson Grayer, MD;  Location: [place] [facility];  Service: Cardiovascular;  Laterality: N/A;   BACK SURGERY     CARDIAC CATHETERIZATION  05/22/2014   Procedure: IABP INSERTION;  Surgeon: Leonie Man, MD;  Location: [facility] CATH LAB;  Service: Cardiovascular;;   CARDIOVERSION N/A 04/29/2017   Procedure: CARDIOVERSION;  Surgeon: Satira Sark, MD;  [facility];  Service:  Cardiovascular;  Laterality: N/A;   CARDIOVERSION N/A 08/13/2017   Procedure: CARDIOVERSION;  Surgeon: Pixie Casino, MD;  Location: [place];  Service: Cardiovascular;  Laterality: N/A;   CARDIOVERSION N/A 08/15/2019   Procedure: CARDIOVERSION;  Surgeon: Dorothy Spark, MD;  Location: [facility] ENDOSCOPY;  Service: Cardiovascular;  Laterality: N/A;   CARDIOVERSION N/A 03/09/2020   Procedure: CARDIOVERSION;  Surgeon: Buford Dresser, MD;  Location: [place];  Service: Cardiovascular;  Laterality: N/A;   CARDIOVERSION N/A 07/06/2020   Procedure: CARDIOVERSION;  Surgeon: Sanda Klein, MD;   Location: [place];  Service: Cardiovascular;  Laterality: N/A;   CARDIOVERSION N/A 01/18/2021   Procedure: CARDIOVERSION;  Surgeon: Skeet Latch, MD;  Location: [facility] ENDOSCOPY;  Service: Cardiovascular;  Laterality: N/A;   CARDIOVERSION N/A 06/17/2021   Procedure: CARDIOVERSION;  Surgeon: Geralynn Rile, MD;  Location: [place];  Service: Cardiovascular;  Laterality: N/A;   Cataract surgery Right    COLONOSCOPY     COLONOSCOPY N/A 12/30/2017   Procedure: COLONOSCOPY;  Surgeon: Rogene Houston, MD;  [facility];  Service: Endoscopy;  Laterality: N/A;   CORONARY ARTERY BYPASS GRAFT N/A 05/22/2014   Procedure: CORONARY ARTERY BYPASS GRAFTING (CABG) times three using left internal mammary and right saphenous vein.;  Surgeon: Melrose Nakayama, MD;  Location: [place];  Service: Open Heart Surgery;  Laterality: N/A;   ENDARTERECTOMY Left 02/17/2014   Procedure: ENDARTERECTOMY CAROTID WITH PATCH ANGIOPLASTY;  Surgeon: Mal Misty, MD;  Location: [place];  Service: Vascular;  Laterality: Left;   ESOPHAGEAL DILATION N/A 08/22/2015   Procedure: ESOPHAGEAL DILATION;  Surgeon: Rogene Houston, MD;  [facility];  Service: Endoscopy;  Laterality: N/A;   ESOPHAGOGASTRODUODENOSCOPY N/A 08/22/2015   Procedure: ESOPHAGOGASTRODUODENOSCOPY (EGD);  Surgeon: Rogene Houston, MD;  [facility];  Service: Endoscopy;  Laterality: N/A;  12:45 - moved to 1:55 - Ann notified pt   ESOPHAGOGASTRODUODENOSCOPY N/A 12/30/2017   Procedure: ESOPHAGOGASTRODUODENOSCOPY (EGD);  Surgeon: Rogene Houston, MD;  [facility];  Service: Endoscopy;  Laterality: N/A;  200   EYE SURGERY     cataract extraction (right) with repair macular tear , with IOL     right   FRACTURE SURGERY     bilateral wrist fractures- one ORIF   JOINT REPLACEMENT  2011   left knee   LEFT HEART CATHETERIZATION WITH CORONARY ANGIOGRAM N/A 05/22/2014   Procedure: LEFT HEART CATHETERIZATION WITH  CORONARY ANGIOGRAM;  Surgeon: Leonie Man, MD;  Location: [facility] CATH LAB;  Service: Cardiovascular;  Laterality: N/A;   LITHOTRIPSY     PARS PLANA VITRECTOMY W/ REPAIR OF MACULAR HOLE     RHINOPLASTY     TEE WITHOUT CARDIOVERSION N/A 04/29/2017   Procedure: TRANSESOPHAGEAL ECHOCARDIOGRAM (TEE) WITH PROPOFOL;  Surgeon: Satira Sark, MD;  [facility];  Service: Cardiovascular;  Laterality: N/A;   TONSILLECTOMY     TOTAL HIP ARTHROPLASTY  12/08/2011   Procedure: TOTAL HIP ARTHROPLASTY;  Surgeon: Gearlean Alf, MD;  Location: [facility];  Service: Orthopedics;  Laterality: Right;   UPPER GI ENDOSCOPY  12/18/2015   Procedure: UPPER GI ENDOSCOPY;  Surgeon: Ralene Ok, MD;  Location: [facility];  Service: General;;   WRIST SURGERY Right 46yr ago   WRIST SURGERY Left     Current Medications: Current Meds  Medication Sig   acetaminophen (TYLENOL) 500 MG tablet Take 1,000 mg by mouth every 6 (six) hours as needed for moderate pain.   amiodarone (PACERONE)  200 MG tablet TAKE 1 TABLET BY MOUTH EVERY DAY   amoxicillin (AMOXIL) 500 MG capsule Take 2,000 mg by mouth See admin instructions. Take 4 capsules (2000 mg) by mouth before dental procedures   atorvastatin (LIPITOR) 80 MG tablet TAKE 1 TABLET BY MOUTH EVERY DAY IN THE EVENING   colchicine 0.6 MG tablet Take 0.6 mg by mouth daily as needed (for gout flare up).   doxazosin (CARDURA) 4 MG tablet Take 4 mg by mouth in the morning.   empagliflozin (JARDIANCE) 10 MG TABS tablet Take 1 tablet (10 mg total) by mouth daily before breakfast.   finasteride (PROSCAR) 5 MG tablet Take 5 mg by mouth every evening.    fluticasone (FLONASE) 50 MCG/ACT nasal spray Place 1 spray into both nostrils daily as needed for allergies or rhinitis.   furosemide (LASIX) 80 MG tablet Take 1 tablet (80 mg total) by mouth 2 (two) times daily.   lisinopril (ZESTRIL) 40 MG tablet Take 40 mg by mouth daily.   metoprolol succinate (TOPROL-XL) 25 MG 24 hr tablet  Take 1 tablet (25 mg total) by mouth daily.   nitroGLYCERIN (NITROSTAT) 0.4 MG SL tablet Place 1 tablet (0.4 mg total) under the tongue every 5 (five) minutes x 3 doses as needed for chest pain.   pantoprazole (PROTONIX) 40 MG tablet Take 1 tablet (40 mg total) by mouth daily before breakfast.   Rivaroxaban (XARELTO) 15 MG TABS tablet Take 1 tablet (15 mg total) by mouth daily with supper.   [DISCONTINUED] furosemide (LASIX) 40 MG tablet Take 1.5 tablets (60 mg total) by mouth 2 (two) times daily.     Allergies:   Codeine sulfate   Social History   Socioeconomic History   Marital status: Married    Spouse name: Not on file   Number of children: Not on file   Years of education: Not on file   Highest education level: Not on file  Occupational History   Occupation: retired    Comment: [employer]  Tobacco Use   Smoking status: Former    Packs/day: 1.50    Years: 25.00    Total pack years: 37.50    Types: Cigarettes    Start date: 08/08/1960    Quit date: 06/02/1982    Years since quitting: 40.0    Passive exposure: Never   Smokeless tobacco: Never   Tobacco comments:    quit smoking 30+yrs ago  Vaping Use   Vaping Use: Never used  Substance and Sexual Activity   Alcohol use: No    Alcohol/week: 0.0 standard drinks of alcohol    Comment: occasionally    Drug use: No   Sexual activity: Yes  Other Topics Concern   Not on file  Social History Narrative   Not on file   Social Determinants of Health   Financial Resource Strain: Not on file  Food Insecurity: Not on file  Transportation Needs: Not on file  Physical Activity: Not on file  Stress: Not on file  Social Connections: Not on file     Family History: The patient's family history includes Allergies in his mother; Heart disease in his father and mother; Hypertension in his mother.  ROS:   Please see the history of present illness.     All other systems reviewed and are negative.  EKGs/Labs/Other  Studies Reviewed:    The following studies were reviewed today: Hospital records reviewed lab work echo   Recent Labs: 06/25/2021: TSH 3.640 12/11/2021: ALT 24  12/13/2021: Hemoglobin 8.8; Platelets 279 12/15/2021: Magnesium 2.4 12/31/2021: Pro B Natriuretic peptide (BNP) 829.0 03/21/2022: B Natriuretic Peptide 847.0; BUN 24; Creatinine, Ser 1.96; Potassium 4.7; Sodium 142  Recent Lipid Panel    Component Value Date/Time   CHOL 134 03/22/2015 0816   TRIG 92 03/22/2015 0816   HDL 41 03/22/2015 0816   CHOLHDL 3.3 03/22/2015 0816   VLDL 18 03/22/2015 0816   LDLCALC 75 03/22/2015 0816     Risk Assessment/Calculations:              Physical Exam:    VS:  BP (!) 130/56   Pulse (!) 58   Ht 5' 10.5" (1.791 m)   Wt 232 lb (105.2 kg)   SpO2 98%   BMI 32.82 kg/m     Wt Readings from Last 3 Encounters:  06/11/22 232 lb (105.2 kg)  06/10/22 229 lb 11.2 oz (104.2 kg)  05/09/22 228 lb (103.4 kg)     Significant lower extremity edema with compression stockings in place.  Abdominal protuberance noted.  No crackles on lungs.  No ASSESSMENT:    1. Coronary artery disease involving native coronary artery of native heart without angina pectoris   2. Essential hypertension   3. Persistent atrial fibrillation (HCC)      PLAN:    In order of problems listed above:  Chronic systolic heart failure with mildly reduced ejection fraction EF 45% - We are going to increase his Lasix to 80 mg twice a day.  I am going to add back Jardiance 10 mg.  He is clearly volume overloaded.  Weight continues to increase.  Continue to monitor his creatinine.  We will check at clinic follow-up.  Protuberant abdomen.  Toprol.  Continue with 25 mg a day.  Discussed with wife as well.  Continuing with amiodarone 200 mg a day for rhythm control.  No ARN I because of creatinine.  Salt and fluid restrictions.  Last creatinine 1.96 - If we are unsuccessful or he becomes more symptomatic, he will need to be  hospitalized.  We discussed.  Coronary artery disease status post CABG 2015 - See anatomy as above.  Doing well without any anginal symptoms.  Minimally elevated troponin was acute myocardial injury in the setting of pneumonia and heart failure.  No anginal symptoms.  Overall no anginal symptoms.  Paroxysmal atrial fibrillation - 2 ablations 2019 2022 [name].  Cardioversion 06/2021. - Continue with amiodarone 200 mg a day.  Continue with close monitoring of LFTs TSH lung studies.  Recently saw pulmonary.  No changes made.  Chronic anticoagulation - Continue with Xarelto dose adjusted 15 mg because of GFR.  He states that expense of the medication is tough.  He would like to have samples.  Chronic kidney disease stage IIIb - Creatinine 1.9-2.1.  We have dose adjusted medications as above.  Recent pneumonia/Boop - Following cruise to [place].  Wearing supplemental oxygen currently.  Monitored by primary team, Dr. Annamaria Boots.  No changes.  Left CEA/CABG for asymptomatic carotid artery stenosis on 02/17/2014 by Dr. Kellie Simmering  Close follow-up in 1 week, we will check a basic metabolic profile at that time as well.      Medication Adjustments/Labs and Tests Ordered: Current medicines are reviewed at length with the patient today.  Concerns regarding medicines are outlined above.  Orders Placed This Encounter  Procedures   Basic metabolic panel   Meds ordered this encounter  Medications   empagliflozin (JARDIANCE) 10 MG TABS tablet  Sig: Take 1 tablet (10 mg total) by mouth daily before breakfast.    Dispense:  30 tablet    Refill:  11   furosemide (LASIX) 80 MG tablet    Sig: Take 1 tablet (80 mg total) by mouth 2 (two) times daily.    Dispense:  180 tablet    Refill:  3    Patient Instructions  Medication Instructions:  1.Start Jardiance 10 mg daily before breakfast 2.Increase furosemide (Lasix) to 80 mg twice daily *If you need a refill on your cardiac medications before your next  appointment, please call your pharmacy*   Lab Work: BMET in 1 week, 06/18/2022 If you have labs (blood work) drawn today and your tests are completely normal, you will receive your results only by: [place] (if you have MyChart) OR A paper copy in the mail If you have any lab test that is abnormal or we need to change your treatment, we will call you to review the results.   Follow-Up: At [facility], you and your health needs are our priority.  As part of our continuing mission to provide you with exceptional heart care, we have created designated Provider Care Teams.  These Care Teams include your primary Cardiologist (physician) and Advanced Practice Providers (APPs -  Physician Assistants and Nurse Practitioners) who all work together to provide you with the care you need, when you need it.  Your next appointment:   06/18/2022 at 10:00 AM  The format for your next appointment:   In Person  Provider:   Candee Furbish, MD    Important Information About Sugar         Signed, Candee Furbish, MD  06/11/2022 1:15 PM    [facility]

## 2022-06-17 ENCOUNTER — Ambulatory Visit: Payer: Medicare HMO | Admitting: Cardiology

## 2022-06-17 ENCOUNTER — Ambulatory Visit: Payer: Medicare HMO

## 2022-06-17 ENCOUNTER — Encounter: Payer: Self-pay | Admitting: Cardiology

## 2022-06-17 VITALS — BP 128/56 | HR 58 | Ht 70.0 in | Wt 223.0 lb

## 2022-06-17 DIAGNOSIS — N1832 Chronic kidney disease, stage 3b: Secondary | ICD-10-CM

## 2022-06-17 DIAGNOSIS — I10 Essential (primary) hypertension: Secondary | ICD-10-CM

## 2022-06-17 DIAGNOSIS — I4819 Other persistent atrial fibrillation: Secondary | ICD-10-CM

## 2022-06-17 DIAGNOSIS — I2510 Atherosclerotic heart disease of native coronary artery without angina pectoris: Secondary | ICD-10-CM

## 2022-06-17 DIAGNOSIS — I5022 Chronic systolic (congestive) heart failure: Secondary | ICD-10-CM | POA: Diagnosis not present

## 2022-06-17 LAB — BASIC METABOLIC PANEL
BUN/Creatinine Ratio: 11 (ref 10–24)
BUN: 28 mg/dL — ABNORMAL HIGH (ref 8–27)
CO2: 30 mmol/L — ABNORMAL HIGH (ref 20–29)
Calcium: 8.5 mg/dL — ABNORMAL LOW (ref 8.6–10.2)
Chloride: 95 mmol/L — ABNORMAL LOW (ref 96–106)
Creatinine, Ser: 2.45 mg/dL — ABNORMAL HIGH (ref 0.76–1.27)
Glucose: 92 mg/dL (ref 70–99)
Potassium: 3.6 mmol/L (ref 3.5–5.2)
Sodium: 140 mmol/L (ref 134–144)
eGFR: 26 mL/min/{1.73_m2} — ABNORMAL LOW (ref >59)

## 2022-06-17 NOTE — Patient Instructions (Signed)
Medication Instructions:  The current medical regimen is effective;  continue present plan and medications.  *If you need a refill on your cardiac medications before your next appointment, please call your pharmacy*   Follow-Up: At [facility], you and your health needs are our priority.  As part of our continuing mission to provide you with exceptional heart care, we have created designated Provider Care Teams.  These Care Teams include your primary Cardiologist (physician) and Advanced Practice Providers (APPs -  Physician Assistants and Nurse Practitioners) who all work together to provide you with the care you need, when you need it.  We recommend signing up for the patient portal called "MyChart".  Sign up information is provided on this After Visit Summary.  MyChart is used to connect with patients for Virtual Visits (Telemedicine).  Patients are able to view lab/test results, encounter notes, upcoming appointments, etc.  Non-urgent messages can be sent to your provider as well.   To learn more about what you can do with MyChart, go to [email or web address].    Your next appointment:   1 month(s)  Provider:   Candee Furbish, MD

## 2022-06-17 NOTE — Progress Notes (Signed)
Cardiology Office Note:    Date:  06/17/2022   ID:  Caleb Wolf, DOB November 18, 1940, MRN [id number]  PCP:  Asencion Noble, MD   [facility] Providers Cardiologist:  Candee Furbish, MD Electrophysiologist:  Thompson Grayer, MD     Referring MD: Asencion Noble, MD    History of Present Illness:    Caleb Wolf is a 82 y.o. male here for follow-up chronic systolic heart failure, CABG 2015 pneumonia/Boop after trip to [place], recurrent atrial fibrillation post ablation and cardioversion in 06/2021 continues to take amiodarone 200 mg a day.    Continues to struggle with increased weight gain, fluid gain especially in his lower extremities and abdomen.   Still short of breath.  Not doing well with balancing exercise.  In review of prior note-he went on a cruise to [place] for his 52nd wedding anniversary.  Enjoyed.  Toward the end of the cruise was getting more and more short of breath.  Ended up developing heart failure as well as pneumonia.  Came into the hospital for further evaluation.   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  Had pneumonia/Boop.  He was diuresed with assistance of Reds vest.  Weight went down to 225 pounds with 6 days of diuresis. Now is 216.  His losartan 50 mg and Jardiance 10 mg was held because of GFR 30.  Minimally elevated troponin was not consistent with ACS.  He is not on aspirin because of Xarelto.  He was in sinus rhythm with ablation in 2019 and repeat in July 2022.  He remains on amiodarone.  Cardizem was switched over to Toprol because of cardiomyopathy.  His Xarelto as well was on hold because of hemoptysis.  He was placed on Xarelto 15 mg because of renal function.  06/17/22: Smart watch, Apple, HR fell below 40 during  sleep asymptomatic.Marland Kitchen  Lost 10 pounds of fluid. Legs are still quite edematous. No CP. Still feels weak. Poor balance.   Past Medical History:  Diagnosis Date   AAA (abdominal aortic aneurysm) (HCC)    Arthritis    Basal cell carcinoma    Carotid stenosis    Chronic kidney disease, stage 3b ([place]) 12/13/2021   Coronary artery disease    Multvessel s/p CABG 2015   Enlarged prostate    Essential hypertension    GERD (gastroesophageal reflux disease)    Gout    History of colon polyps    History of kidney stones    Hyperlipidemia    Lumbar disc disease    Persistent atrial fibrillation (HCC)    Sleep apnea    CPAP    Past Surgical History:  Procedure Laterality Date   ABLATION OF DYSRHYTHMIC FOCUS  07/28/2017   ATRIAL FIBRILLATION ABLATION N/A 07/28/2017   Procedure: ATRIAL FIBRILLATION ABLATION;  Surgeon: Thompson Grayer, MD;  Location: [place] [facility];  Service: Cardiovascular;  Laterality: N/A;   ATRIAL FIBRILLATION ABLATION N/A 12/04/2020   Procedure: ATRIAL FIBRILLATION ABLATION;  Surgeon: Thompson Grayer, MD;  Location: [place] [facility];  Service: Cardiovascular;  Laterality: N/A;   BACK SURGERY     CARDIAC CATHETERIZATION  05/22/2014   Procedure: IABP INSERTION;  Surgeon: Leonie Man, MD;  Location: [facility] LAB;  Service: Cardiovascular;;   CARDIOVERSION N/A 04/29/2017  Procedure: CARDIOVERSION;  Surgeon: Satira Sark, MD;  [facility];  Service: Cardiovascular;  Laterality: N/A;   CARDIOVERSION N/A 08/13/2017   Procedure: CARDIOVERSION;  Surgeon: Pixie Casino, MD;  Location: [place];  Service: Cardiovascular;  Laterality: N/A;   CARDIOVERSION N/A 08/15/2019   Procedure: CARDIOVERSION;  Surgeon: Dorothy Spark, MD;  Location: [facility];  Service: Cardiovascular;  Laterality: N/A;   CARDIOVERSION N/A 03/09/2020   Procedure: CARDIOVERSION;  Surgeon: Buford Dresser, MD;  Location: [place];  Service: Cardiovascular;  Laterality:  N/A;   CARDIOVERSION N/A 07/06/2020   Procedure: CARDIOVERSION;  Surgeon: Sanda Klein, MD;  Location: [place];  Service: Cardiovascular;  Laterality: N/A;   CARDIOVERSION N/A 01/18/2021   Procedure: CARDIOVERSION;  Surgeon: Skeet Latch, MD;  Location: [facility] ENDOSCOPY;  Service: Cardiovascular;  Laterality: N/A;   CARDIOVERSION N/A 06/17/2021   Procedure: CARDIOVERSION;  Surgeon: Geralynn Rile, MD;  Location: [place];  Service: Cardiovascular;  Laterality: N/A;   Cataract surgery Right    COLONOSCOPY     COLONOSCOPY N/A 12/30/2017   Procedure: COLONOSCOPY;  Surgeon: Rogene Houston, MD;  [facility];  Service: Endoscopy;  Laterality: N/A;   CORONARY ARTERY BYPASS GRAFT N/A 05/22/2014   Procedure: CORONARY ARTERY BYPASS GRAFTING (CABG) times three using left internal mammary and right saphenous vein.;  Surgeon: Melrose Nakayama, MD;  Location: [facility];  Service: Open Heart Surgery;  Laterality: N/A;   ENDARTERECTOMY Left 02/17/2014   Procedure: ENDARTERECTOMY CAROTID WITH PATCH ANGIOPLASTY;  Surgeon: Mal Misty, MD;  Location: [place];  Service: Vascular;  Laterality: Left;   ESOPHAGEAL DILATION N/A 08/22/2015   Procedure: ESOPHAGEAL DILATION;  Surgeon: Rogene Houston, MD;  [facility];  Service: Endoscopy;  Laterality: N/A;   ESOPHAGOGASTRODUODENOSCOPY N/A 08/22/2015   Procedure: ESOPHAGOGASTRODUODENOSCOPY (EGD);  Surgeon: Rogene Houston, MD;  [facility];  Service: Endoscopy;  Laterality: N/A;  12:45 - moved to 1:55 - Ann notified pt   ESOPHAGOGASTRODUODENOSCOPY N/A 12/30/2017   Procedure: ESOPHAGOGASTRODUODENOSCOPY (EGD);  Surgeon: Rogene Houston, MD;  [facility];  Service: Endoscopy;  Laterality: N/A;  200   EYE SURGERY     cataract extraction (right) with repair macular tear , with IOL     right   FRACTURE SURGERY     bilateral wrist fractures- one ORIF   JOINT REPLACEMENT  2011   left knee   LEFT HEART  CATHETERIZATION WITH CORONARY ANGIOGRAM N/A 05/22/2014   Procedure: LEFT HEART CATHETERIZATION WITH CORONARY ANGIOGRAM;  Surgeon: Leonie Man, MD;  Location[facility] CATH LAB;  Service: Cardiovascular;  Laterality: N/A;   LITHOTRIPSY     PARS PLANA VITRECTOMY W/ REPAIR OF MACULAR HOLE     RHINOPLASTY     TEE WITHOUT CARDIOVERSION N/A 04/29/2017   Procedure: TRANSESOPHAGEAL ECHOCARDIOGRAM (TEE) WITH PROPOFOL;  Surgeon: Satira Sark, MD;  [facility];  Service: Cardiovascular;  Laterality: N/A;   TONSILLECTOMY     TOTAL HIP ARTHROPLASTY  12/08/2011   Procedure: TOTAL HIP ARTHROPLASTY;  Surgeon: Gearlean Alf, MD;  Location: [facility];  Service: Orthopedics;  Laterality: Right;   UPPER GI ENDOSCOPY  12/18/2015   Procedure: UPPER GI ENDOSCOPY;  Surgeon: Ralene Ok, MD;  Location: [facility];  Service: General;;   WRIST SURGERY Right 18yr ago   WRIST SURGERY Left     Current Medications: Current Meds  Medication Sig   acetaminophen (TYLENOL) 500 MG tablet Take 1,000 mg  by mouth every 6 (six) hours as needed for moderate pain.   amiodarone (PACERONE) 200 MG tablet TAKE 1 TABLET BY MOUTH EVERY DAY   amoxicillin (AMOXIL) 500 MG capsule Take 2,000 mg by mouth See admin instructions. Take 4 capsules (2000 mg) by mouth before dental procedures   atorvastatin (LIPITOR) 80 MG tablet TAKE 1 TABLET BY MOUTH EVERY DAY IN THE EVENING   colchicine 0.6 MG tablet Take 0.6 mg by mouth daily as needed (for gout flare up).   doxazosin (CARDURA) 4 MG tablet Take 4 mg by mouth in the morning.   empagliflozin (JARDIANCE) 10 MG TABS tablet Take 1 tablet (10 mg total) by mouth daily before breakfast.   finasteride (PROSCAR) 5 MG tablet Take 5 mg by mouth every evening.    fluticasone (FLONASE) 50 MCG/ACT nasal spray Place 1 spray into both nostrils daily as needed for allergies or rhinitis.   furosemide (LASIX) 80 MG tablet Take 1 tablet (80 mg total) by mouth 2 (two) times daily.   lisinopril  (ZESTRIL) 40 MG tablet Take 40 mg by mouth daily.   metoprolol succinate (TOPROL-XL) 25 MG 24 hr tablet Take 1 tablet (25 mg total) by mouth daily.   nitroGLYCERIN (NITROSTAT) 0.4 MG SL tablet Place 1 tablet (0.4 mg total) under the tongue every 5 (five) minutes x 3 doses as needed for chest pain.   pantoprazole (PROTONIX) 40 MG tablet Take 1 tablet (40 mg total) by mouth daily before breakfast.   Rivaroxaban (XARELTO) 15 MG TABS tablet Take 1 tablet (15 mg total) by mouth daily with supper.     Allergies:   Codeine sulfate   Social History   Socioeconomic History   Marital status: Married    Spouse name: Not on file   Number of children: Not on file   Years of education: Not on file   Highest education level: Not on file  Occupational History   Occupation: retired    Comment: [employer]  Tobacco Use   Smoking status: Former    Packs/day: 1.50    Years: 25.00    Total pack years: 37.50    Types: Cigarettes    Start date: 08/08/1960    Quit date: 06/02/1982    Years since quitting: 40.0    Passive exposure: Never   Smokeless tobacco: Never   Tobacco comments:    quit smoking 30+yrs ago  Vaping Use   Vaping Use: Never used  Substance and Sexual Activity   Alcohol use: No    Alcohol/week: 0.0 standard drinks of alcohol    Comment: occasionally    Drug use: No   Sexual activity: Yes  Other Topics Concern   Not on file  Social History Narrative   Not on file   Social Determinants of Health   Financial Resource Strain: Not on file  Food Insecurity: Not on file  Transportation Needs: Not on file  Physical Activity: Not on file  Stress: Not on file  Social Connections: Not on file     Family History: The patient's family history includes Allergies in his mother; Heart disease in his father and mother; Hypertension in his mother.  ROS:   Please see the history of present illness.     All other systems reviewed and are negative.  EKGs/Labs/Other Studies  Reviewed:    The following studies were reviewed today: Hospital records reviewed lab work echo   Recent Labs: 06/25/2021: TSH 3.640 12/11/2021: ALT 24 12/13/2021: Hemoglobin 8.8; Platelets 279  12/15/2021: Magnesium 2.4 12/31/2021: Pro B Natriuretic peptide (BNP) 829.0 03/21/2022: B Natriuretic Peptide 847.0; BUN 24; Creatinine, Ser 1.96; Potassium 4.7; Sodium 142  Recent Lipid Panel    Component Value Date/Time   CHOL 134 03/22/2015 0816   TRIG 92 03/22/2015 0816   HDL 41 03/22/2015 0816   CHOLHDL 3.3 03/22/2015 0816   VLDL 18 03/22/2015 0816   LDLCALC 75 03/22/2015 0816     Risk Assessment/Calculations:              Physical Exam:    VS:  BP (!) 128/56   Pulse (!) 58   Ht '5\' 10"'$  (1.778 m)   Wt 223 lb (101.2 kg)   SpO2 98%   BMI 32.00 kg/m     Wt Readings from Last 3 Encounters:  06/17/22 223 lb (101.2 kg)  06/11/22 232 lb (105.2 kg)  06/10/22 229 lb 11.2 oz (104.2 kg)     Significant lower extremity edema with compression stockings in place.  Abdominal protuberance noted.  No crackles on lungs.  No ASSESSMENT:    1. Chronic systolic heart failure (HCC)   2. Stage 3b chronic kidney disease (HCC)   3. Persistent atrial fibrillation (HCC)       PLAN:    In order of problems listed above:  Chronic systolic heart failure with mildly reduced ejection fraction EF 45% - On Jan 10 visit we increased his Lasix to 80 mg twice a day.  I added back Jardiance 10 mg.  He is down approximately 10 pounds today to 223 from 233.  BMET today. Continue to monitor his creatinine.    Protuberant abdomen.  Toprol.  Continue with 25 mg a day.  Discussed with wife as well.  Continuing with amiodarone 200 mg a day for rhythm control.  Current heart rate is 58.  His watch did state that his heart rates were going less than 40 at times during the night.  He is not having any syncopal episodes during the day.  No ARNI because of creatinine.  Salt and fluid restrictions.  Last creatinine  1.96 - If we are unsuccessful or he becomes more symptomatic, he will need to be hospitalized.  We discussed once again.  Coronary artery disease status post CABG 2015 - See anatomy as above.  Doing well without any anginal symptoms.  Minimally elevated troponin was acute myocardial injury in the setting of pneumonia and heart failure.  No anginal symptoms.  Overall no anginal symptoms.  Paroxysmal atrial fibrillation - 2 ablations 2019 2022 [name].  Cardioversion 06/2021. - Continue with amiodarone 200 mg a day.  Continue with close monitoring of LFTs TSH lung studies.  Recently saw pulmonary.  No changes made.  Chronic anticoagulation - Continue with Xarelto dose adjusted 15 mg because of GFR.  He states that expense of the medication is tough.  He would like to have samples.  Chronic kidney disease stage IIIb - Creatinine 1.9-2.1.  We have dose adjusted medications as above.  Recent pneumonia/Boop - Following cruise to [place].  Wearing supplemental oxygen currently.  Monitored by primary team, Dr. Annamaria Boots.  No changes.  Left CEA/CABG for asymptomatic carotid artery stenosis on 02/17/2014 by Dr. Kellie Simmering  Close follow-up in 1 month     Medication Adjustments/Labs and Tests Ordered: Current medicines are reviewed at length with the patient today.  Concerns regarding medicines are outlined above.  No orders of the defined types were placed in this encounter.  No orders of the defined  types were placed in this encounter.   Patient Instructions  Medication Instructions:  The current medical regimen is effective;  continue present plan and medications.  *If you need a refill on your cardiac medications before your next appointment, please call your pharmacy*   Follow-Up: At [facility], you and your health needs are our priority.  As part of our continuing mission to provide you with exceptional heart care, we have created designated Provider Care Teams.  These Care Teams  include your primary Cardiologist (physician) and Advanced Practice Providers (APPs -  Physician Assistants and Nurse Practitioners) who all work together to provide you with the care you need, when you need it.  We recommend signing up for the patient portal called "MyChart".  Sign up information is provided on this After Visit Summary.  MyChart is used to connect with patients for Virtual Visits (Telemedicine).  Patients are able to view lab/test results, encounter notes, upcoming appointments, etc.  Non-urgent messages can be sent to your provider as well.   To learn more about what you can do with MyChart, go to [email or web address].    Your next appointment:   1 month(s)  Provider:   Candee Furbish, MD        Signed, Candee Furbish, MD  06/17/2022 10:33 AM    [place]

## 2022-06-18 ENCOUNTER — Other Ambulatory Visit: Payer: Medicare HMO

## 2022-06-18 DIAGNOSIS — I4891 Unspecified atrial fibrillation: Secondary | ICD-10-CM | POA: Diagnosis not present

## 2022-06-18 DIAGNOSIS — J9621 Acute and chronic respiratory failure with hypoxia: Secondary | ICD-10-CM | POA: Diagnosis not present

## 2022-06-19 DIAGNOSIS — N401 Enlarged prostate with lower urinary tract symptoms: Secondary | ICD-10-CM | POA: Diagnosis not present

## 2022-06-19 DIAGNOSIS — N200 Calculus of kidney: Secondary | ICD-10-CM | POA: Diagnosis not present

## 2022-06-19 DIAGNOSIS — N312 Flaccid neuropathic bladder, not elsewhere classified: Secondary | ICD-10-CM | POA: Diagnosis not present

## 2022-06-19 DIAGNOSIS — N529 Male erectile dysfunction, unspecified: Secondary | ICD-10-CM | POA: Diagnosis not present

## 2022-07-01 DIAGNOSIS — R339 Retention of urine, unspecified: Secondary | ICD-10-CM | POA: Diagnosis not present

## 2022-07-04 DIAGNOSIS — G4733 Obstructive sleep apnea (adult) (pediatric): Secondary | ICD-10-CM | POA: Diagnosis not present

## 2022-07-10 ENCOUNTER — Encounter (HOSPITAL_COMMUNITY): Payer: Self-pay | Admitting: *Deleted

## 2022-07-18 ENCOUNTER — Ambulatory Visit: Payer: Medicare HMO | Attending: Internal Medicine | Admitting: Cardiology

## 2022-07-18 ENCOUNTER — Encounter: Payer: Self-pay | Admitting: Cardiology

## 2022-07-18 VITALS — BP 134/78 | HR 50 | Ht 70.0 in | Wt 221.0 lb

## 2022-07-18 DIAGNOSIS — I4819 Other persistent atrial fibrillation: Secondary | ICD-10-CM | POA: Diagnosis not present

## 2022-07-18 DIAGNOSIS — I2510 Atherosclerotic heart disease of native coronary artery without angina pectoris: Secondary | ICD-10-CM | POA: Diagnosis not present

## 2022-07-18 DIAGNOSIS — I10 Essential (primary) hypertension: Secondary | ICD-10-CM

## 2022-07-18 DIAGNOSIS — I5022 Chronic systolic (congestive) heart failure: Secondary | ICD-10-CM | POA: Diagnosis not present

## 2022-07-18 DIAGNOSIS — N1832 Chronic kidney disease, stage 3b: Secondary | ICD-10-CM | POA: Diagnosis not present

## 2022-07-18 NOTE — Progress Notes (Signed)
Cardiology Office Note:    Date:  07/18/2022   ID:  Caleb Wolf, DOB September 04, 1940, MRN [id number]  PCP:  Asencion Noble, MD   [facility] Providers Cardiologist:  Candee Furbish, MD Electrophysiologist:  Thompson Grayer, MD (Inactive)     Referring MD: Asencion Noble, MD    History of Present Illness:    Caleb Wolf is a 82 y.o. male here for follow-up chronic systolic heart failure.   CABG 2015 pneumonia/Boop after trip to [place], recurrent atrial fibrillation post ablation and cardioversion in 06/2021 continues to take amiodarone 200 mg a day.    In review of prior note-he went on a cruise to [place] for his 52nd wedding anniversary.  Enjoyed.  Toward the end of the cruise was getting more and more short of breath.  Ended up developing heart failure as well as pneumonia.  Came into the hospital for further evaluation.   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  Had pneumonia/Boop.  He was diuresed with assistance of Reds vest.  Weight went down to 225 pounds with 6 days of diuresis. Now is 216.  His losartan 50 mg and Jardiance 10 mg was held because of GFR 30.  He is not on aspirin because of Xarelto.  He was in sinus rhythm with ablation in 2019 and repeat in July 2022.  He remains on amiodarone.     He was placed on Xarelto 15 mg because of renal function.   Past Medical History:  Diagnosis Date   AAA (abdominal aortic aneurysm) (HCC)    Arthritis    Basal cell carcinoma    Carotid stenosis    Chronic kidney disease, stage 3b ([place]) 12/13/2021   Coronary artery disease    Multvessel s/p CABG 2015   Enlarged prostate    Essential hypertension    GERD (gastroesophageal reflux disease)    Gout    History of colon polyps    History of  kidney stones    Hyperlipidemia    Lumbar disc disease    Persistent atrial fibrillation (HCC)    Sleep apnea    CPAP    Past Surgical History:  Procedure Laterality Date   ABLATION OF DYSRHYTHMIC FOCUS  07/28/2017   ATRIAL FIBRILLATION ABLATION N/A 07/28/2017   Procedure: ATRIAL FIBRILLATION ABLATION;  Surgeon: Thompson Grayer, MD;  Location: [place] CV LAB;  Service: Cardiovascular;  Laterality: N/A;   ATRIAL FIBRILLATION ABLATION N/A 12/04/2020   Procedure: ATRIAL FIBRILLATION ABLATION;  Surgeon: Thompson Grayer, MD;  Location: [place] CV LAB;  Service: Cardiovascular;  Laterality: N/A;   BACK SURGERY     CARDIAC CATHETERIZATION  05/22/2014   Procedure: IABP INSERTION;  Surgeon: Leonie Man, MD;  Location: [facility] CATH LAB;  Service: Cardiovascular;;   CARDIOVERSION N/A 04/29/2017   Procedure: CARDIOVERSION;  Surgeon: Satira Sark, MD;  [facility];  Service: Cardiovascular;  Laterality: N/A;   CARDIOVERSION N/A 08/13/2017   Procedure: CARDIOVERSION;  Surgeon: Pixie Casino, MD;  Location: [facility] ENDOSCOPY;  Service: Cardiovascular;  Laterality: N/A;   CARDIOVERSION N/A 08/15/2019   Procedure: CARDIOVERSION;  Surgeon: Dorothy Spark, MD;  Location: [facility] ENDOSCOPY;  Service: Cardiovascular;  Laterality: N/A;   CARDIOVERSION N/A 03/09/2020   Procedure: CARDIOVERSION;  Surgeon: Buford Dresser,  MD;  Location: [place];  Service: Cardiovascular;  Laterality: N/A;   CARDIOVERSION N/A 07/06/2020   Procedure: CARDIOVERSION;  Surgeon: Sanda Klein, MD;  Location: [place];  Service: Cardiovascular;  Laterality: N/A;   CARDIOVERSION N/A 01/18/2021   Procedure: CARDIOVERSION;  Surgeon: Skeet Latch, MD;  Location: [facility] ENDOSCOPY;  Service: Cardiovascular;  Laterality: N/A;   CARDIOVERSION N/A 06/17/2021   Procedure: CARDIOVERSION;  Surgeon: Geralynn Rile, MD;  Location: [place];  Service: Cardiovascular;  Laterality: N/A;   Cataract surgery Right     COLONOSCOPY     COLONOSCOPY N/A 12/30/2017   Procedure: COLONOSCOPY;  Surgeon: Rogene Houston, MD;  [facility];  Service: Endoscopy;  Laterality: N/A;   CORONARY ARTERY BYPASS GRAFT N/A 05/22/2014   Procedure: CORONARY ARTERY BYPASS GRAFTING (CABG) times three using left internal mammary and right saphenous vein.;  Surgeon: Melrose Nakayama, MD;  Location: [place];  Service: Open Heart Surgery;  Laterality: N/A;   ENDARTERECTOMY Left 02/17/2014   Procedure: ENDARTERECTOMY CAROTID WITH PATCH ANGIOPLASTY;  Surgeon: Mal Misty, MD;  Location: [place];  Service: Vascular;  Laterality: Left;   ESOPHAGEAL DILATION N/A 08/22/2015   Procedure: ESOPHAGEAL DILATION;  Surgeon: Rogene Houston, MD;  [facility];  Service: Endoscopy;  Laterality: N/A;   ESOPHAGOGASTRODUODENOSCOPY N/A 08/22/2015   Procedure: ESOPHAGOGASTRODUODENOSCOPY (EGD);  Surgeon: Rogene Houston, MD;  [facility];  Service: Endoscopy;  Laterality: N/A;  12:45 - moved to 1:55 - Ann notified pt   ESOPHAGOGASTRODUODENOSCOPY N/A 12/30/2017   Procedure: ESOPHAGOGASTRODUODENOSCOPY (EGD);  Surgeon: Rogene Houston, MD;  [facility];  Service: Endoscopy;  Laterality: N/A;  200   EYE SURGERY     cataract extraction (right) with repair macular tear , with IOL     right   FRACTURE SURGERY     bilateral wrist fractures- one ORIF   JOINT REPLACEMENT  2011   left knee   LEFT HEART CATHETERIZATION WITH CORONARY ANGIOGRAM N/A 05/22/2014   Procedure: LEFT HEART CATHETERIZATION WITH CORONARY ANGIOGRAM;  Surgeon: Leonie Man, MD;  Location: [facility] CATH LAB;  Service: Cardiovascular;  Laterality: N/A;   LITHOTRIPSY     PARS PLANA VITRECTOMY W/ REPAIR OF MACULAR HOLE     RHINOPLASTY     TEE WITHOUT CARDIOVERSION N/A 04/29/2017   Procedure: TRANSESOPHAGEAL ECHOCARDIOGRAM (TEE) WITH PROPOFOL;  Surgeon: Satira Sark, MD;  [facility];  Service: Cardiovascular;  Laterality: N/A;    TONSILLECTOMY     TOTAL HIP ARTHROPLASTY  12/08/2011   Procedure: TOTAL HIP ARTHROPLASTY;  Surgeon: Gearlean Alf, MD;  Location: [facility];  Service: Orthopedics;  Laterality: Right;   UPPER GI ENDOSCOPY  12/18/2015   Procedure: UPPER GI ENDOSCOPY;  Surgeon: Ralene Ok, MD;  Location: [facility];  Service: General;;   WRIST SURGERY Right 63yr ago   WRIST SURGERY Left     Current Medications: Current Meds  Medication Sig   acetaminophen (TYLENOL) 500 MG tablet Take 1,000 mg by mouth every 6 (six) hours as needed for moderate pain.   amiodarone (PACERONE) 200 MG tablet TAKE 1 TABLET BY MOUTH EVERY DAY   amoxicillin (AMOXIL) 500 MG capsule Take 2,000 mg by mouth See admin instructions. Take 4 capsules (2000 mg) by mouth before dental procedures   atorvastatin (LIPITOR) 80 MG tablet TAKE 1 TABLET BY MOUTH EVERY DAY IN THE EVENING   colchicine 0.6 MG tablet Take 0.6 mg by mouth daily as needed (for  gout flare up).   doxazosin (CARDURA) 4 MG tablet Take 4 mg by mouth in the morning.   empagliflozin (JARDIANCE) 10 MG TABS tablet Take 1 tablet (10 mg total) by mouth daily before breakfast.   finasteride (PROSCAR) 5 MG tablet Take 5 mg by mouth every evening.    fluticasone (FLONASE) 50 MCG/ACT nasal spray Place 1 spray into both nostrils daily as needed for allergies or rhinitis.   furosemide (LASIX) 80 MG tablet Take 1 tablet (80 mg total) by mouth 2 (two) times daily.   nitroGLYCERIN (NITROSTAT) 0.4 MG SL tablet Place 1 tablet (0.4 mg total) under the tongue every 5 (five) minutes x 3 doses as needed for chest pain.   pantoprazole (PROTONIX) 40 MG tablet Take 1 tablet (40 mg total) by mouth daily before breakfast.   Rivaroxaban (XARELTO) 15 MG TABS tablet Take 1 tablet (15 mg total) by mouth daily with supper.   [DISCONTINUED] metoprolol succinate (TOPROL-XL) 25 MG 24 hr tablet Take 1 tablet (25 mg total) by mouth daily.     Allergies:   Codeine sulfate   Social History   Socioeconomic  History   Marital status: Married    Spouse name: Not on file   Number of children: Not on file   Years of education: Not on file   Highest education level: Not on file  Occupational History   Occupation: retired    Comment: [employer]  Tobacco Use   Smoking status: Former    Packs/day: 1.50    Years: 25.00    Total pack years: 37.50    Types: Cigarettes    Start date: 08/08/1960    Quit date: 06/02/1982    Years since quitting: 40.1    Passive exposure: Never   Smokeless tobacco: Never   Tobacco comments:    quit smoking 30+yrs ago  Vaping Use   Vaping Use: Never used  Substance and Sexual Activity   Alcohol use: No    Alcohol/week: 0.0 standard drinks of alcohol    Comment: occasionally    Drug use: No   Sexual activity: Yes  Other Topics Concern   Not on file  Social History Narrative   Not on file   Social Determinants of Health   Financial Resource Strain: Not on file  Food Insecurity: Not on file  Transportation Needs: Not on file  Physical Activity: Not on file  Stress: Not on file  Social Connections: Not on file     Family History: The patient's family history includes Allergies in his mother; Heart disease in his father and mother; Hypertension in his mother.  ROS:   Please see the history of present illness.     All other systems reviewed and are negative.  EKGs/Labs/Other Studies Reviewed:    The following studies were reviewed today: Hospital records reviewed lab work echo   Recent Labs: 12/11/2021: ALT 24 12/13/2021: Hemoglobin 8.8; Platelets 279 12/15/2021: Magnesium 2.4 12/31/2021: Pro B Natriuretic peptide (BNP) 829.0 03/21/2022: B Natriuretic Peptide 847.0 06/17/2022: BUN 28; Creatinine, Ser 2.45; Potassium 3.6; Sodium 140  Recent Lipid Panel    Component Value Date/Time   CHOL 134 03/22/2015 0816   TRIG 92 03/22/2015 0816   HDL 41 03/22/2015 0816   CHOLHDL 3.3 03/22/2015 0816   VLDL 18 03/22/2015 0816   LDLCALC 75  03/22/2015 0816     Risk Assessment/Calculations:              Physical Exam:    VS:  BP 134/78   Pulse (!) 50   Ht 5' 10"$  (1.778 m)   Wt 221 lb (100.2 kg)   SpO2 98%   BMI 31.71 kg/m     Wt Readings from Last 3 Encounters:  07/18/22 221 lb (100.2 kg)  06/17/22 223 lb (101.2 kg)  06/11/22 232 lb (105.2 kg)     Significant lower extremity edema with compression stockings in place.  Abdominal protuberance noted.  No crackles on lungs.  No ASSESSMENT:    1. Chronic systolic heart failure (HCC)   2. Stage 3b chronic kidney disease (HCC)   3. Persistent atrial fibrillation ([place])   4. Coronary artery disease involving native coronary artery of native heart without angina pectoris   5. Essential hypertension        PLAN:    In order of problems listed above:  Chronic systolic heart failure with mildly reduced ejection fraction EF 45% - On Jan 10 visit we increased his Lasix to 80 mg twice a day.  I added back Jardiance 10 mg.  Weight is 221 from 233.  BMET today. Continue to monitor his creatinine.    Protuberant abdomen continues.   Discussed with wife as well.  Continuing with amiodarone 200 mg a day for rhythm control.  They are very pleased that he is maintaining normal sinus rhythm.  We struggled with that previously.  They are frustrated with heart rate however.  Mostly in the 22s.  We are stopping with Toprol-XL 25 mg.  He is not having any syncopal episodes during the day.  Continue to work on daily exercises, even chair exercises, balance.  He uses a rolling walker.  He has had a fall.  No ARNI because of creatinine.  Salt and fluid restrictions.    Coronary artery disease status post CABG 2015 - See anatomy as above.  Doing well without any anginal symptoms.  Minimally elevated troponin was acute myocardial injury in the setting of pneumonia and heart failure.  No anginal symptoms.  Overall no anginal symptoms.  Stable.  Paroxysmal atrial fibrillation - 2  ablations 2019 2022 [name].  Cardioversion 06/2021. - Continue with amiodarone 200 mg a day.  They have been very pleased with his NSR. Continue with close monitoring of LFTs TSH lung studies.  We will check it again. Saw pulmonary.  No changes made.  --Will stop Toprol 77m.  Chronic anticoagulation - Continue with Xarelto dose adjusted 15 mg because of GFR.  He states that expense of the medication is tough.  He would like to have samples in the past.  Chronic kidney disease stage IIIb - Creatinine 1.9-2.6.  We have dose adjusted medications as above.  Try to avoid heavy protein diet.  At last check creatinine was increased.  We have to be careful.  For now continue with current high-dose Lasix.  Awaiting labs.  Prior pneumonia/Boop - [place]    Monitored by primary team, Dr. YAnnamaria Boots  No changes.  Left CEA/CABG for asymptomatic carotid artery stenosis on 02/17/2014 by Dr. LKellie Simmering  Wife asked about potential Zoloft or SSRI.  I would not be opposed to this.  I will defer to Dr. FWilley Bladeof course.  Close follow-up in 3 months     Medication Adjustments/Labs and Tests Ordered: Current medicines are reviewed at length with the patient today.  Concerns regarding medicines are outlined above.  Orders Placed This Encounter  Procedures   Comp Met (CMET)   TSH   No  orders of the defined types were placed in this encounter.   Patient Instructions  Medication Instructions:   DISCONTINUE Metoprolol.  *If you need a refill on your cardiac medications before your next appointment, please call your pharmacy*   Lab Work:  TODAY!!!!! CMET/TSH.  If you have labs (blood work) drawn today and your tests are completely normal, you will receive your results only by: [place] (if you have MyChart) OR A paper copy in the mail If you have any lab test that is abnormal or we need to change your treatment, we will call you to review the  results.   Testing/Procedures:  None ordered.   Follow-Up: At [facility], you and your health needs are our priority.  As part of our continuing mission to provide you with exceptional heart care, we have created designated Provider Care Teams.  These Care Teams include your primary Cardiologist (physician) and Advanced Practice Providers (APPs -  Physician Assistants and Nurse Practitioners) who all work together to provide you with the care you need, when you need it.  We recommend signing up for the patient portal called "MyChart".  Sign up information is provided on this After Visit Summary.  MyChart is used to connect with patients for Virtual Visits (Telemedicine).  Patients are able to view lab/test results, encounter notes, upcoming appointments, etc.  Non-urgent messages can be sent to your provider as well.   To learn more about what you can do with MyChart, go to [email or web address].    Your next appointment:   3 month(s)  Provider:   Candee Furbish, MD        Signed, Candee Furbish, MD  07/18/2022 10:22 AM    [place]

## 2022-07-18 NOTE — Patient Instructions (Signed)
Medication Instructions:   DISCONTINUE Metoprolol.  *If you need a refill on your cardiac medications before your next appointment, please call your pharmacy*   Lab Work:  TODAY!!!!! CMET/TSH.  If you have labs (blood work) drawn today and your tests are completely normal, you will receive your results only by: [place] (if you have MyChart) OR A paper copy in the mail If you have any lab test that is abnormal or we need to change your treatment, we will call you to review the results.   Testing/Procedures:  None ordered.   Follow-Up: At [facility], you and your health needs are our priority.  As part of our continuing mission to provide you with exceptional heart care, we have created designated Provider Care Teams.  These Care Teams include your primary Cardiologist (physician) and Advanced Practice Providers (APPs -  Physician Assistants and Nurse Practitioners) who all work together to provide you with the care you need, when you need it.  We recommend signing up for the patient portal called "MyChart".  Sign up information is provided on this After Visit Summary.  MyChart is used to connect with patients for Virtual Visits (Telemedicine).  Patients are able to view lab/test results, encounter notes, upcoming appointments, etc.  Non-urgent messages can be sent to your provider as well.   To learn more about what you can do with MyChart, go to [email or web address].    Your next appointment:   3 month(s)  Provider:   Candee Furbish, MD

## 2022-07-19 DIAGNOSIS — I4891 Unspecified atrial fibrillation: Secondary | ICD-10-CM | POA: Diagnosis not present

## 2022-07-19 DIAGNOSIS — J9621 Acute and chronic respiratory failure with hypoxia: Secondary | ICD-10-CM | POA: Diagnosis not present

## 2022-07-19 LAB — COMPREHENSIVE METABOLIC PANEL
ALT: 20 IU/L (ref 0–44)
AST: 20 IU/L (ref 0–40)
Albumin/Globulin Ratio: 1.4 (ref 1.2–2.2)
Albumin: 3.2 g/dL — ABNORMAL LOW (ref 3.8–4.8)
Alkaline Phosphatase: 106 IU/L (ref 44–121)
BUN/Creatinine Ratio: 13 (ref 10–24)
BUN: 25 mg/dL (ref 8–27)
Bilirubin Total: 0.4 mg/dL (ref 0.0–1.2)
CO2: 30 mmol/L — ABNORMAL HIGH (ref 20–29)
Calcium: 8.7 mg/dL (ref 8.6–10.2)
Chloride: 98 mmol/L (ref 96–106)
Creatinine, Ser: 1.95 mg/dL — ABNORMAL HIGH (ref 0.76–1.27)
Globulin, Total: 2.3 g/dL (ref 1.5–4.5)
Glucose: 93 mg/dL (ref 70–99)
Potassium: 3.9 mmol/L (ref 3.5–5.2)
Sodium: 140 mmol/L (ref 134–144)
Total Protein: 5.5 g/dL — ABNORMAL LOW (ref 6.0–8.5)
eGFR: 34 mL/min/{1.73_m2} — ABNORMAL LOW (ref >59)

## 2022-07-19 LAB — TSH: TSH: 5.9 u[IU]/mL — ABNORMAL HIGH (ref 0.450–4.500)

## 2022-07-22 ENCOUNTER — Other Ambulatory Visit: Payer: Self-pay | Admitting: *Deleted

## 2022-07-22 DIAGNOSIS — I10 Essential (primary) hypertension: Secondary | ICD-10-CM

## 2022-07-22 DIAGNOSIS — R7989 Other specified abnormal findings of blood chemistry: Secondary | ICD-10-CM

## 2022-07-22 DIAGNOSIS — I4819 Other persistent atrial fibrillation: Secondary | ICD-10-CM

## 2022-07-22 DIAGNOSIS — Z79899 Other long term (current) drug therapy: Secondary | ICD-10-CM

## 2022-07-22 NOTE — Progress Notes (Signed)
Stable kidney function  TSH was mildly elevated.  In one month repeat BMET and check TSH and free T4 together  Candee Furbish, MD

## 2022-08-02 DIAGNOSIS — G4733 Obstructive sleep apnea (adult) (pediatric): Secondary | ICD-10-CM | POA: Diagnosis not present

## 2022-08-17 DIAGNOSIS — I4891 Unspecified atrial fibrillation: Secondary | ICD-10-CM | POA: Diagnosis not present

## 2022-08-17 DIAGNOSIS — J9621 Acute and chronic respiratory failure with hypoxia: Secondary | ICD-10-CM | POA: Diagnosis not present

## 2022-08-20 DIAGNOSIS — I4819 Other persistent atrial fibrillation: Secondary | ICD-10-CM | POA: Diagnosis not present

## 2022-08-20 DIAGNOSIS — R7989 Other specified abnormal findings of blood chemistry: Secondary | ICD-10-CM | POA: Diagnosis not present

## 2022-08-21 ENCOUNTER — Telehealth: Payer: Self-pay

## 2022-08-21 LAB — BASIC METABOLIC PANEL
BUN/Creatinine Ratio: 12 (ref 10–24)
BUN: 23 mg/dL (ref 8–27)
CO2: 27 mmol/L (ref 20–29)
Calcium: 9.1 mg/dL (ref 8.6–10.2)
Chloride: 101 mmol/L (ref 96–106)
Creatinine, Ser: 1.93 mg/dL — ABNORMAL HIGH (ref 0.76–1.27)
Glucose: 80 mg/dL (ref 70–99)
Potassium: 3.8 mmol/L (ref 3.5–5.2)
Sodium: 147 mmol/L — ABNORMAL HIGH (ref 134–144)
eGFR: 35 mL/min/{1.73_m2} — ABNORMAL LOW (ref >59)

## 2022-08-21 LAB — TSH: TSH: 5.69 u[IU]/mL — ABNORMAL HIGH (ref 0.450–4.500)

## 2022-08-21 LAB — T4, FREE: Free T4: 1.57 ng/dL (ref 0.82–1.77)

## 2022-08-21 NOTE — Telephone Encounter (Signed)
The patient has been notified of the result and verbalized understanding.  All questions (if any) were answered.     

## 2022-08-27 DIAGNOSIS — Z85828 Personal history of other malignant neoplasm of skin: Secondary | ICD-10-CM | POA: Diagnosis not present

## 2022-08-27 DIAGNOSIS — D239 Other benign neoplasm of skin, unspecified: Secondary | ICD-10-CM | POA: Diagnosis not present

## 2022-08-27 DIAGNOSIS — Z1283 Encounter for screening for malignant neoplasm of skin: Secondary | ICD-10-CM | POA: Diagnosis not present

## 2022-08-27 DIAGNOSIS — L570 Actinic keratosis: Secondary | ICD-10-CM | POA: Diagnosis not present

## 2022-08-28 ENCOUNTER — Ambulatory Visit: Payer: Medicare HMO | Admitting: Internal Medicine

## 2022-09-01 ENCOUNTER — Ambulatory Visit: Payer: Medicare HMO | Admitting: Internal Medicine

## 2022-09-02 DIAGNOSIS — G4733 Obstructive sleep apnea (adult) (pediatric): Secondary | ICD-10-CM | POA: Diagnosis not present

## 2022-09-09 DIAGNOSIS — M1000 Idiopathic gout, unspecified site: Secondary | ICD-10-CM | POA: Diagnosis not present

## 2022-09-09 DIAGNOSIS — G4733 Obstructive sleep apnea (adult) (pediatric): Secondary | ICD-10-CM | POA: Diagnosis not present

## 2022-09-09 DIAGNOSIS — I10 Essential (primary) hypertension: Secondary | ICD-10-CM | POA: Diagnosis not present

## 2022-09-09 DIAGNOSIS — Z79899 Other long term (current) drug therapy: Secondary | ICD-10-CM | POA: Diagnosis not present

## 2022-09-09 DIAGNOSIS — I5022 Chronic systolic (congestive) heart failure: Secondary | ICD-10-CM | POA: Diagnosis not present

## 2022-09-09 DIAGNOSIS — N1832 Chronic kidney disease, stage 3b: Secondary | ICD-10-CM | POA: Diagnosis not present

## 2022-09-09 DIAGNOSIS — I2510 Atherosclerotic heart disease of native coronary artery without angina pectoris: Secondary | ICD-10-CM | POA: Diagnosis not present

## 2022-09-09 DIAGNOSIS — I480 Paroxysmal atrial fibrillation: Secondary | ICD-10-CM | POA: Diagnosis not present

## 2022-09-09 DIAGNOSIS — M5186 Other intervertebral disc disorders, lumbar region: Secondary | ICD-10-CM | POA: Diagnosis not present

## 2022-09-09 DIAGNOSIS — I700 Atherosclerosis of aorta: Secondary | ICD-10-CM | POA: Diagnosis not present

## 2022-09-10 DIAGNOSIS — N3000 Acute cystitis without hematuria: Secondary | ICD-10-CM | POA: Diagnosis not present

## 2022-09-16 ENCOUNTER — Other Ambulatory Visit (HOSPITAL_COMMUNITY): Payer: Self-pay | Admitting: Internal Medicine

## 2022-09-16 DIAGNOSIS — I480 Paroxysmal atrial fibrillation: Secondary | ICD-10-CM | POA: Diagnosis not present

## 2022-09-16 DIAGNOSIS — E785 Hyperlipidemia, unspecified: Secondary | ICD-10-CM | POA: Diagnosis not present

## 2022-09-16 DIAGNOSIS — I5022 Chronic systolic (congestive) heart failure: Secondary | ICD-10-CM | POA: Diagnosis not present

## 2022-09-16 DIAGNOSIS — N390 Urinary tract infection, site not specified: Secondary | ICD-10-CM | POA: Diagnosis not present

## 2022-09-16 DIAGNOSIS — N1832 Chronic kidney disease, stage 3b: Secondary | ICD-10-CM | POA: Diagnosis not present

## 2022-09-16 DIAGNOSIS — G4733 Obstructive sleep apnea (adult) (pediatric): Secondary | ICD-10-CM | POA: Diagnosis not present

## 2022-09-16 DIAGNOSIS — R140 Abdominal distension (gaseous): Secondary | ICD-10-CM

## 2022-09-16 DIAGNOSIS — N400 Enlarged prostate without lower urinary tract symptoms: Secondary | ICD-10-CM | POA: Diagnosis not present

## 2022-09-17 DIAGNOSIS — I4891 Unspecified atrial fibrillation: Secondary | ICD-10-CM | POA: Diagnosis not present

## 2022-09-17 DIAGNOSIS — J9621 Acute and chronic respiratory failure with hypoxia: Secondary | ICD-10-CM | POA: Diagnosis not present

## 2022-09-21 NOTE — Progress Notes (Unsigned)
Subjective:    Patient ID: Caleb Wolf, male    DOB: 1941/04/05, 82 y.o.   MRN: [id number]  HPI   Male former smoker followed for OSA, complicated by HBP, CAD/MI/CABG, carotid stenosis, osteoarthritis hip, GERD NPSG 07/2012  AHI 15/ hr, CPAP to 10 ONOX 03/25/22- on O2 2L w/ CPAP- O2 sat well maintained until end ofg night when he took it off. ================================================================================== .  04/07/22-  82 year old male former smoker followed for OSA, Pleural Effusion, complicated by HTN, CAD/MI/CABG/AFib/ Xarelto, carotid stenosis, osteoarthritis hip, GERD CPAP auto 5-20/[employer] O2 2L sleep/ [employer] Download-compliance 100%, AHI 8.8/ hr   centrals> obst Body weight today-230 lbs Covid vax-3 Moderna                     Wife here Flu vax-had at PCP Discussed contributors to easy fatigue with exertion. Discussed overlap of cardiac and pulmonary and deconditioning, with encouragement to walk driveway with rolling walker if he needs, to maintain some endurance. Download reviewed.. Fewer centrals. CXR reviewed. Discussed impact of (probably cardiogenic) effusion. Hopefully it can reabsorb without much peel and without thoracentesis. ONOX 03/25/22- on O2 2L w/ CPAP- O2 sat well maintained until end of night when he took it off. CXR 03/06/22- IMPRESSION: 1. No acute cardiopulmonary disease. 2. Small to moderate right pleural effusion. Mild, right greater than left, basilar atelectasis. No significant change when compared to the most recent prior study. No convincing pneumonia and no pulmonary edema   09/22/22- 83 year old male former smoker followed for OSA, Pleural Effusion, complicated by HTN, CAD/MI/CABG/AFib/ Xarelto, carotid stenosis, osteoarthritis hip, GERD CPAP auto 5-20/[employer] O2 2L sleep/ [employer] Download-compliance 80%, AHI 3.1/ hr Body weight today-204 lbs Wife is here.  He reports sleeping okay with supplemental oxygen plus his CPAP but they note  CPAP machine is 80 years old so we will try to get it replaced.  Download reviewed. He has been developing abdominal ascites for sound with result pending.  He is not aware of liver disease but has chronic renal insufficiency.  Cardiology put him on a diuretic.  He remains in chronic atrial fibrillation. We will update CXR to check on status of previous right pleural effusion but that is likely related to the same mechanism as his abdominal process.   ROS-see HPI + = positive Constitutional:    weight loss, night sweats, fevers, chills, f+atigue, lassitude. HEENT:    headaches, difficulty swallowing, tooth/dental problems, sore throat,       sneezing, itching, ear ache, nasal congestion, post nasal drip, snoring CV:    chest pain, orthopnea, PND, swelling in lower extremities, anasarca,                                                dizziness, palpitations Resp:   +shortness of breath with exertion or at rest.                productive cough,   non-productive cough, coughing up of blood.              change in color of mucus.  wheezing.   Skin:    rash or lesions. GI:  No-   heartburn, indigestion, abdominal pain, nausea, vomiting, diarrhea,                 change in bowel habits, loss of appetite  GU: dysuria, change in color of urine, no urgency or frequency.   flank pain. MS:   joint pain, stiffness, decreased range of motion, back pain. Neuro-     nothing unusual Psych:  change in mood or affect.  depression or anxiety.   memory loss.    Objective:  OBJ- Physical Exam                     +POC on 3L  O2 sat 94% General- Alert, Oriented, Affect-appropriate, Distress- none acute, + overweight Skin- rash-none, lesions- none, excoriation- none Lymphadenopathy- none Head- atraumatic            Eyes- Gross vision intact, PERRLA, conjunctivae and secretions clear            Ears- Hearing, canals-normal            Nose- Clear, no-Septal dev, mucus, polyps, erosion, perforation              Throat- Mallampati III-IV, mucosa clear , drainage- none, tonsils- atrophic Neck- flexible , trachea midline, no stridor , thyroid nl, carotid no bruit Chest - symmetrical excursion , unlabored           Heart/CV- RR+today , no murmur , no gallop  , no rub, nl s1 s2                           - JVD- none , edema- none, stasis changes- none, varices- none           Lung- clear to P&A, wheeze- none, cough- none , +diminished in bases, rub- none           Chest wall-  Abd-  Br/ Gen/ Rectal- Not done, not indicated Extrem- +elastic hose L ankle Neuro- grossly intact to observation

## 2022-09-22 ENCOUNTER — Ambulatory Visit (INDEPENDENT_AMBULATORY_CARE_PROVIDER_SITE_OTHER): Payer: Medicare HMO

## 2022-09-22 ENCOUNTER — Encounter: Payer: Self-pay | Admitting: Internal Medicine

## 2022-09-22 ENCOUNTER — Ambulatory Visit: Payer: Medicare HMO | Admitting: Internal Medicine

## 2022-09-22 ENCOUNTER — Ambulatory Visit (HOSPITAL_COMMUNITY)
Admission: RE | Admit: 2022-09-22 | Discharge: 2022-09-22 | Disposition: A | Discharge status: Home / Self Care | Payer: Medicare HMO | Source: Ambulatory Visit | Attending: Internal Medicine | Admitting: Internal Medicine

## 2022-09-22 VITALS — BP 130/68 | HR 59 | Ht 69.0 in | Wt 204.8 lb

## 2022-09-22 DIAGNOSIS — J90 Pleural effusion, not elsewhere classified: Secondary | ICD-10-CM | POA: Diagnosis not present

## 2022-09-22 DIAGNOSIS — R140 Abdominal distension (gaseous): Secondary | ICD-10-CM | POA: Diagnosis not present

## 2022-09-22 DIAGNOSIS — R188 Other ascites: Secondary | ICD-10-CM | POA: Diagnosis not present

## 2022-09-22 DIAGNOSIS — K824 Cholesterolosis of gallbladder: Secondary | ICD-10-CM | POA: Diagnosis not present

## 2022-09-22 DIAGNOSIS — G4733 Obstructive sleep apnea (adult) (pediatric): Secondary | ICD-10-CM

## 2022-09-22 NOTE — Patient Instructions (Signed)
Order- DME [employer]- please replace old CPAP machine auto 5-20, mask of choice, humidifier, supplies, AirView/ card Continue O2 2l bleed in through CPAP for sleep  Order- CXR   dx pleural effusion, ascites

## 2022-09-22 NOTE — Assessment & Plan Note (Signed)
Likely related to his ascites which is being evaluated elsewhere. Plan-CXR

## 2022-09-22 NOTE — Assessment & Plan Note (Signed)
He has benefited from CPAP and has been compliant with CPAP plus oxygen, using a spare machine when out of town. Plan-replace old CPAP machine continuing auto 5-20 with O2 2 L

## 2022-09-25 ENCOUNTER — Other Ambulatory Visit (HOSPITAL_COMMUNITY): Payer: Self-pay | Admitting: Internal Medicine

## 2022-09-25 DIAGNOSIS — R188 Other ascites: Secondary | ICD-10-CM

## 2022-10-02 ENCOUNTER — Ambulatory Visit (HOSPITAL_COMMUNITY)
Admission: RE | Admit: 2022-10-02 | Discharge: 2022-10-02 | Disposition: A | Discharge status: Home / Self Care | Payer: Medicare HMO | Source: Ambulatory Visit | Attending: Internal Medicine | Admitting: Internal Medicine

## 2022-10-02 ENCOUNTER — Encounter (HOSPITAL_COMMUNITY): Payer: Self-pay

## 2022-10-02 DIAGNOSIS — R188 Other ascites: Secondary | ICD-10-CM

## 2022-10-06 DIAGNOSIS — G4733 Obstructive sleep apnea (adult) (pediatric): Secondary | ICD-10-CM | POA: Diagnosis not present

## 2022-10-08 ENCOUNTER — Encounter (INDEPENDENT_AMBULATORY_CARE_PROVIDER_SITE_OTHER): Payer: Self-pay | Admitting: *Deleted

## 2022-10-14 DIAGNOSIS — H04123 Dry eye syndrome of bilateral lacrimal glands: Secondary | ICD-10-CM | POA: Diagnosis not present

## 2022-10-14 DIAGNOSIS — H0102A Squamous blepharitis right eye, upper and lower eyelids: Secondary | ICD-10-CM | POA: Diagnosis not present

## 2022-10-14 DIAGNOSIS — H43812 Vitreous degeneration, left eye: Secondary | ICD-10-CM | POA: Diagnosis not present

## 2022-10-14 DIAGNOSIS — H0102B Squamous blepharitis left eye, upper and lower eyelids: Secondary | ICD-10-CM | POA: Diagnosis not present

## 2022-10-14 DIAGNOSIS — H35372 Puckering of macula, left eye: Secondary | ICD-10-CM | POA: Diagnosis not present

## 2022-10-14 DIAGNOSIS — Z961 Presence of intraocular lens: Secondary | ICD-10-CM | POA: Diagnosis not present

## 2022-10-17 DIAGNOSIS — I4891 Unspecified atrial fibrillation: Secondary | ICD-10-CM | POA: Diagnosis not present

## 2022-10-17 DIAGNOSIS — J9621 Acute and chronic respiratory failure with hypoxia: Secondary | ICD-10-CM | POA: Diagnosis not present

## 2022-10-20 ENCOUNTER — Encounter: Payer: Self-pay | Admitting: Cardiology

## 2022-10-20 ENCOUNTER — Ambulatory Visit: Payer: Medicare HMO | Attending: Cardiology | Admitting: Cardiology

## 2022-10-20 VITALS — BP 140/58 | HR 55 | Ht 69.0 in | Wt 208.0 lb

## 2022-10-20 DIAGNOSIS — I4819 Other persistent atrial fibrillation: Secondary | ICD-10-CM | POA: Diagnosis not present

## 2022-10-20 DIAGNOSIS — I2510 Atherosclerotic heart disease of native coronary artery without angina pectoris: Secondary | ICD-10-CM | POA: Diagnosis not present

## 2022-10-20 DIAGNOSIS — Z951 Presence of aortocoronary bypass graft: Secondary | ICD-10-CM | POA: Diagnosis not present

## 2022-10-20 DIAGNOSIS — N1832 Chronic kidney disease, stage 3b: Secondary | ICD-10-CM | POA: Diagnosis not present

## 2022-10-20 NOTE — Progress Notes (Signed)
Cardiology Office Note:    Date:  10/20/2022   ID:  Caleb Wolf, DOB [birth year], MRN [id number]  PCP:  Carylon Perches, MD   [facility] Providers Cardiologist:  Donato Schultz, MD Electrophysiologist:  Hillis Range, MD (Inactive)     Referring MD: Carylon Perches, MD    History of Present Illness:    Caleb Wolf is a 82 y.o. male here for follow-up chronic systolic heart failure.   CABG 2015 pneumonia/Boop after trip to [place], recurrent atrial fibrillation post ablation and cardioversion in 06/2021 continues to take amiodarone 200 mg a day.    In review of prior note-he went on a cruise to [place] for his 52nd wedding anniversary.  Enjoyed.  Toward the end of the cruise was getting more and more short of breath.  Ended up developing heart failure as well as pneumonia.  Came into the hospital for further evaluation.   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    He was diuresed with assistance of Reds vest.  Weight went down to 225 pounds with 6 days of diuresis. Now is 208.  His losartan 50 mg and Jardiance 10 mg was held because of GFR 30 in the past.  He is not on aspirin because of Xarelto.  He was in sinus rhythm with ablation in 2019 and repeat in July 2022.  He remains on amiodarone.     He was placed on Xarelto 15 mg because of renal function.  Has lost quite a bit of weight.  Still remains protuberant in his abdomen.  Ultrasound showed cirrhosis of his liver.  Did not drink alcohol in the past.  He is going to see GI.  Certainly his ascites could be multifactorial.  He is with moderate ascites on ultrasound.   Past Medical History:  Diagnosis Date   AAA (abdominal aortic aneurysm) (HCC)    Arthritis    Basal cell carcinoma    Carotid  stenosis    Chronic kidney disease, stage 3b (HCC) 12/13/2021   Coronary artery disease    Multvessel s/p CABG 2015   Enlarged prostate    Essential hypertension    GERD (gastroesophageal reflux disease)    Gout    History of colon polyps    History of kidney stones    Hyperlipidemia    Lumbar disc disease    Persistent atrial fibrillation (HCC)    Sleep apnea    CPAP    Past Surgical History:  Procedure Laterality Date   ABLATION OF DYSRHYTHMIC FOCUS  07/28/2017   ATRIAL FIBRILLATION ABLATION N/A 07/28/2017   Procedure: ATRIAL FIBRILLATION ABLATION;  Surgeon: Hillis Range, MD;  [facility]: [facility] IN[facility];  Service: Cardiovascular;  Laterality: N/A;   ATRIAL FIBRILLATION ABLATION N/A 12/04/2020   Procedure: ATRIAL FIBRILLATION ABLATION;  Surgeon: Hillis Range, MD;  Location: [facility] INVASIVE CV LAB;  Service: Cardiovascular;  Laterality: N/A;   BACK SURGERY     CARDIAC CATHETERIZATION  05/22/2014   Procedure: IABP INSERTION;  Surgeon: Marykay Lex, MD;  Location: [facility] CATH LAB;  Service: Cardiovascular;;   CARDIOVERSION N/A 04/29/2017   Procedure: CARDIOVERSION;  Surgeon: Jonelle Sidle, MD;  Location: [facility] ENDO SUITE;  Service: Cardiovascular;  Laterality: N/A;   CARDIOVERSION N/A 08/13/2017   Procedure: CARDIOVERSION;  Surgeon: Chrystie Nose, MD;  Location: [facility] ENDOSCOPY;  Service: Cardiovascular;  Laterality: N/A;   CARDIOVERSION N/A 08/15/2019   Procedure: CARDIOVERSION;  Surgeon: Lars Masson, MD;  Location: [facility] ENDOSCOPY;  Service: Cardiovascular;  Laterality: N/A;   CARDIOVERSION N/A 03/09/2020   Procedure: CARDIOVERSION;  Surgeon: Jodelle Red, MD;  Location: [facility] ENDOSCOPY;  Service: Cardiovascular;  Laterality: N/A;   CARDIOVERSION N/A 07/06/2020   Procedure: CARDIOVERSION;  Surgeon: Thurmon Fair, MD;  Location: [facility] ENDOSCOPY;  Service: Cardiovascular;  Laterality: N/A;   CARDIOVERSION N/A 01/18/2021   Procedure: CARDIOVERSION;  Surgeon: Chilton Si, MD;   Location: [facility] ENDOSCOPY;  Service: Cardiovascular;  Laterality: N/A;   CARDIOVERSION N/A 06/17/2021   Procedure: CARDIOVERSION;  Surgeon: Sande Rives, MD;  Location: [facility] ENDOSCOPY;  Service: Cardiovascular;  Laterality: N/A;   Cataract surgery Right    COLONOSCOPY     COLONOSCOPY N/A 12/30/2017   Procedure: COLONOSCOPY;  Surgeon: Malissa Hippo, MD;  [facility]O [facility];  Service: Endoscopy;  Laterality: N/A;   CORONARY ARTERY BYPASS GRAFT N/A 05/22/2014   Procedure: CORONARY ARTERY BYPASS GRAFTING (CABG) times three using left internal mammary and right saphenous vein.;  Surgeon: Loreli Slot, MD;  Location: [facility] OR;  Service: Open Heart Surgery;  Laterality: N/A;   ENDARTERECTOMY Left 02/17/2014   Procedure: ENDARTERECTOMY CAROTID WITH PATCH ANGIOPLASTY;  Surgeon: Pryor Ochoa, MD;  Location: [facility] OR;  Service: Vascular;  Laterality: Left;   ESOPHAGEAL DILATION N/A 08/22/2015   Procedure: ESOPHAGEAL DILATION;  Surgeon: Malissa Hippo, MD;  [facility];  Service: Endoscopy;  Laterality: N/A;   ESOPHAGOGASTRODUODENOSCOPY N/A 08/22/2015   Procedure: ESOPHAGOGASTRODUODENOSCOPY (EGD);  Surgeon: Malissa Hippo, MD;  [facility];  Service: Endoscopy;  Laterality: N/A;  12:45 - moved to 1:55 - Ann notified pt   ESOPHAGOGASTRODUODENOSCOPY N/A 12/30/2017   Procedure: ESOPHAGOGASTRODUODENOSCOPY (EGD);  Surgeon: Malissa Hippo, MD;  [facility];  Service: Endoscopy;  Laterality: N/A;  200   EYE SURGERY     cataract extraction (right) with repair macular tear , with IOL     right   FRACTURE SURGERY     bilateral wrist fractures- one ORIF   JOINT REPLACEMENT  2011   left knee   LEFT HEART CATHETERIZATION WITH CORONARY ANGIOGRAM N/A 05/22/2014   Procedure: LEFT HEART CATHETERIZATION WITH CORONARY ANGIOGRAM;  Surgeon: Marykay Lex, MD;  Location: [facility] CATH LAB;  Service: Cardiovascular;  Laterality: N/A;   LITHOTRIPSY     PARS PLANA VITRECTOMY W/  REPAIR OF MACULAR HOLE     RHINOPLASTY     TEE WITHOUT CARDIOVERSION N/A 04/29/2017   Procedure: TRANSESOPHAGEAL ECHOCARDIOGRAM (TEE) WITH PROPOFOL;  Surgeon: Jonelle Sidle, MD;  Location[facility];  Service: Cardiovascular;  Laterality: N/A;   TONSILLECTOMY     TOTAL HIP ARTHROPLASTY  12/08/2011   Procedure: TOTAL HIP ARTHROPLASTY;  Surgeon: Loanne Drilling, MD;  Location: [facility] ORS;  Service: Orthopedics;  Laterality: Right;   UPPER GI ENDOSCOPY  12/18/2015   Procedure: UPPER GI ENDOSCOPY;  Surgeon: Axel Filler, MD;  Location: [facility] ORS;  Service: General;;   WRIST SURGERY Right 78yrs ago   WRIST SURGERY Left     Current Medications: Current Meds  Medication Sig   acetaminophen (TYLENOL) 500 MG tablet Take 1,000 mg by mouth every 6 (six) hours as needed for moderate pain.   amiodarone (PACERONE) 200 MG tablet TAKE 1 TABLET BY MOUTH EVERY DAY   amoxicillin (AMOXIL)  500 MG capsule Take 2,000 mg by mouth See admin instructions. Take 4 capsules (2000 mg) by mouth before dental procedures   atorvastatin (LIPITOR) 80 MG tablet TAKE 1 TABLET BY MOUTH EVERY DAY IN THE EVENING   cholecalciferol (VITAMIN D3) 25 MCG (1000 UNIT) tablet Take 1,000 Units by mouth daily.   colchicine 0.6 MG tablet Take 0.6 mg by mouth daily as needed (for gout flare up).   cyanocobalamin (VITAMIN B12) 1000 MCG tablet Take 1,000 mcg by mouth daily.   doxazosin (CARDURA) 4 MG tablet Take 4 mg by mouth in the morning.   empagliflozin (JARDIANCE) 10 MG TABS tablet Take 1 tablet (10 mg total) by mouth daily before breakfast.   finasteride (PROSCAR) 5 MG tablet Take 5 mg by mouth every evening.    fluticasone (FLONASE) 50 MCG/ACT nasal spray Place 1 spray into both nostrils daily as needed for allergies or rhinitis.   furosemide (LASIX) 80 MG tablet Take 1 tablet (80 mg total) by mouth 2 (two) times daily.   nitroGLYCERIN (NITROSTAT) 0.4 MG SL tablet Place 1 tablet (0.4 mg total) under the tongue every 5 (five) minutes  x 3 doses as needed for chest pain.   pantoprazole (PROTONIX) 40 MG tablet Take 1 tablet (40 mg total) by mouth daily before breakfast.   Rivaroxaban (XARELTO) 15 MG TABS tablet Take 1 tablet (15 mg total) by mouth daily with supper.     Allergies:   Codeine sulfate   Social History   Socioeconomic History   Marital status: Married    Spouse name: Not on file   Number of children: Not on file   Years of education: Not on file   Highest education level: Not on file  Occupational History   Occupation: retired    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  Tobacco Use   Smoking status: Former    Packs/day: 1.50    Years: 25.00    Additional pack years: 0.00    Total pack years: 37.50    Types: Cigarettes    Start date: 08/08/1960    Quit date: 06/02/1982    Years since quitting: 40.4    Passive exposure: Never   Smokeless tobacco: Never   Tobacco comments:    quit smoking 30+yrs ago  Vaping Use   Vaping Use: Never used  Substance and Sexual Activity   Alcohol use: No    Alcohol/week: 0.0 standard drinks of alcohol    Comment: occasionally    Drug use: No   Sexual activity: Yes  Other Topics Concern   Not on file  Social History Narrative   Not on file   Social Determinants of Health   Financial Resource Strain: Not on file  Food Insecurity: Not on file  Transportation Needs: Not on file  Physical Activity: Not on file  Stress: Not on file  Social Connections: Not on file     Family History: The patient's family history includes Allergies in his mother; Heart disease in his father and mother; Hypertension in his mother.  ROS:   Please see the history of present illness.     All other systems reviewed and are negative.  EKGs/Labs/Other Studies Reviewed:    The following studies were reviewed today: Hospital records reviewed lab work echo   Recent Labs: 12/13/2021: Hemoglobin 8.8; Platelets 279 12/15/2021: Magnesium 2.4 12/31/2021: Pro B Natriuretic peptide (BNP)  829.0 03/21/2022: B Natriuretic Peptide 847.0 07/18/2022: ALT 20 08/20/2022: BUN 23; Creatinine, Ser 1.93; Potassium 3.8; Sodium 147; TSH 5.690  Recent Lipid Panel    Component Value Date/Time   CHOL 134 03/22/2015 0816   TRIG 92 03/22/2015 0816   HDL 41 03/22/2015 0816   CHOLHDL 3.3 03/22/2015 0816   VLDL 18 03/22/2015 0816   LDLCALC 75 03/22/2015 0816     Risk Assessment/Calculations:              Physical Exam:    VS:  BP (!) 140/58   Pulse (!) 55   Ht 5\' 9"  (1.753 m)   Wt 208 lb (94.3 kg)   SpO2 97%   BMI 30.72 kg/m     Wt Readings from Last 3 Encounters:  10/20/22 208 lb (94.3 kg)  09/22/22 204 lb 12.8 oz (92.9 kg)  07/18/22 221 lb (100.2 kg)    GEN: Well nourished, well developed, in no acute distress HEENT: normal Neck: no JVD, carotid bruits, or masses Cardiac: RRR; no murmurs, rubs, or gallops, improved LE edema  Respiratory:  clear to auscultation bilaterally, normal work of breathing GI: soft, nontender, +distended, + BS MS: no deformity or atrophy Skin: warm and dry, no rash Neuro:  Alert and Oriented x 3, Strength and sensation are intact Psych: euthymic mood, full affect  ASSESSMENT:    1. Persistent atrial fibrillation (HCC)   2. Stage 3b chronic kidney disease (HCC)   3. Coronary artery disease involving native coronary artery of native heart without angina pectoris   4. S/P CABG x 3         PLAN:    In order of problems listed above:  Chronic systolic heart failure with mildly reduced ejection fraction EF 45% - Prior visit we increased his Lasix to 80 mg twice a day.  I added back Jardiance 10 mg then as well.   Weight is 208 from 233.  Wife says that it is 202 at home.    Continuing with amiodarone 200 mg a day for rhythm control.  They are very pleased that he is maintaining normal sinus rhythm.  We struggled with that previously.  They are frustrated with heart rate however.  Mostly in the 52s.  We stopped the Toprol-XL 25 mg.  He is  not having any syncopal episodes during the day.  Continue to work on daily exercises, even chair exercises, balance.  He uses a rolling walker.  He has had a fall.  No ARNI because of creatinine.  Salt and fluid restrictions.    Ascites - Moderate amount of ascites noted on abdominal ultrasound ordered by Dr. Ouida Sills.  He also had notation of cirrhosis of liver.  He has referral to gastroenterology in [place] for July.  Excellent.  Coronary artery disease status post CABG 2015 - See anatomy as above.  Doing well without any anginal symptoms.  Minimally elevated troponin was acute myocardial injury in the setting of pneumonia and heart failure.  No anginal symptoms.  Overall no anginal symptoms.  Stable.  Paroxysmal atrial fibrillation - 2 ablations 2019 2022 Dr. Johney Frame.  Cardioversion 06/2021. - Continue with amiodarone 200 mg a day.  They have been very pleased with his NSR. Continue with close monitoring of LFTs TSH lung studies.  We will check it again. Saw pulmonary.  No changes made.   Chronic anticoagulation - Continue with Xarelto dose adjusted 15 mg because of GFR.  He states that expense of the medication is tough.  He would like to have samples in the past.  Chronic kidney disease stage IIIb - Creatinine 1.9-2.6.  We have  dose adjusted medications as above.  Try to avoid heavy protein diet.  At last check creatinine was increased.  We have to be careful.  For now continue with current high-dose Lasix.  Creatinine has remained fairly stable.  Potassium 3.8.  Prior pneumonia/Boop - Following cruise to [place].    Monitored by primary team, Dr. Maple Hudson.  No changes.  Left CEA/CABG for asymptomatic carotid artery stenosis on 02/17/2014 by Dr. Hart Rochester    Close follow-up in 6 months, APP     Medication Adjustments/Labs and Tests Ordered: Current medicines are reviewed at length with the patient today.  Concerns regarding medicines are outlined above.  No orders of the defined types were  placed in this encounter.  No orders of the defined types were placed in this encounter.   Patient Instructions  Medication Instructions:  The current medical regimen is effective;  continue present plan and medications.  *If you need a refill on your cardiac medications before your next appointment, please call your pharmacy*  Follow-Up: At [facility], you and your health needs are our priority.  As part of our continuing mission to provide you with exceptional heart care, we have created designated Provider Care Teams.  These Care Teams include your primary Cardiologist (physician) and Advanced Practice Providers (APPs -  Physician Assistants and Nurse Practitioners) who all work together to provide you with the care you need, when you need it.  We recommend signing up for the patient portal called "MyChart".  Sign up information is provided on this After Visit Summary.  MyChart is used to connect with patients for Virtual Visits (Telemedicine).  Patients are able to view lab/test results, encounter notes, upcoming appointments, etc.  Non-urgent messages can be sent to your provider as well.   To learn more about what you can do with MyChart, go to [email or web address].    Your next appointment:   6 month(s)  Provider:   Jari Favre, PA-C, Robin Searing, NP, Jacolyn Reedy, PA-C, Eligha Bridegroom, NP, or Tereso Newcomer, PA-C      Then, Donato Schultz, MD will plan to see you again in 1 year(s).       Signed, Donato Schultz, MD  10/20/2022 11:41 AM    [facility]

## 2022-10-20 NOTE — Patient Instructions (Signed)
Medication Instructions:  The current medical regimen is effective;  continue present plan and medications.  *If you need a refill on your cardiac medications before your next appointment, please call your pharmacy*  Follow-Up: At [facility], you and your health needs are our priority.  As part of our continuing mission to provide you with exceptional heart care, we have created designated Provider Care Teams.  These Care Teams include your primary Cardiologist (physician) and Advanced Practice Providers (APPs -  Physician Assistants and Nurse Practitioners) who all work together to provide you with the care you need, when you need it.  We recommend signing up for the patient portal called "MyChart".  Sign up information is provided on this After Visit Summary.  MyChart is used to connect with patients for Virtual Visits (Telemedicine).  Patients are able to view lab/test results, encounter notes, upcoming appointments, etc.  Non-urgent messages can be sent to your provider as well.   To learn more about what you can do with MyChart, go to [email or web address].    Your next appointment:   6 month(s)  Provider:   Tessa Conte, PA-C, Ernest Dick, NP, Michele Lenze, PA-C, Michelle Swinyer, NP, or Scott Weaver, PA-C     Then, Mark Skains, MD will plan to see you again in 1 year(s).     

## 2022-10-24 ENCOUNTER — Telehealth: Payer: Self-pay

## 2022-10-24 MED ORDER — AMIODARONE HCL 200 MG PO TABS: 200.0000 mg | ORAL_TABLET | Freq: Every day | ORAL | 1 refills | Status: DC

## 2022-10-24 NOTE — Addendum Note (Signed)
Addended by: Alvin Critchley A on: 10/24/2022 01:18 PM   Modules accepted: Orders

## 2022-10-24 NOTE — Telephone Encounter (Signed)
Spoke with patient's wife, [name], who states patient will be out of amiodarone in a few days. She forgot to ask for refill on last office visit on 10/20/22.   Refill for amiodarone 200 mg daily submitted for refill.

## 2022-10-24 NOTE — Telephone Encounter (Signed)
The patient's spouse called and said that the patient is out of his amiodoarone and that the pharmacy is having trouble getting a response from the office.  Mrs. Azimi was told that it appears that the afib clinic has been managing the prescription, Mrs. Hoovler said that the patient was just in to see Dr. Anne Fu and wanted to know if he would refill the medication.  I told her that I would send a message to the nurse to see if  Dr. Anne Fu will refill the medication for the patient.

## 2022-11-04 DIAGNOSIS — G4733 Obstructive sleep apnea (adult) (pediatric): Secondary | ICD-10-CM | POA: Diagnosis not present

## 2022-11-06 DIAGNOSIS — G4733 Obstructive sleep apnea (adult) (pediatric): Secondary | ICD-10-CM | POA: Diagnosis not present

## 2022-11-10 ENCOUNTER — Encounter (INDEPENDENT_AMBULATORY_CARE_PROVIDER_SITE_OTHER): Payer: Self-pay | Admitting: Gastroenterology

## 2022-11-10 ENCOUNTER — Ambulatory Visit (INDEPENDENT_AMBULATORY_CARE_PROVIDER_SITE_OTHER): Payer: Medicare HMO | Admitting: Gastroenterology

## 2022-11-10 VITALS — BP 125/66 | HR 76 | Temp 97.3°F | Ht 69.0 in | Wt 210.2 lb

## 2022-11-10 DIAGNOSIS — R188 Other ascites: Secondary | ICD-10-CM

## 2022-11-10 DIAGNOSIS — K5904 Chronic idiopathic constipation: Secondary | ICD-10-CM

## 2022-11-10 DIAGNOSIS — K7460 Unspecified cirrhosis of liver: Secondary | ICD-10-CM

## 2022-11-10 DIAGNOSIS — K5900 Constipation, unspecified: Secondary | ICD-10-CM | POA: Insufficient documentation

## 2022-11-10 MED ORDER — SPIRONOLACTONE 25 MG PO TABS
25.0000 mg | ORAL_TABLET | Freq: Every day | ORAL | 0 refills | Status: DC
Start: 2022-11-10 — End: 2023-02-16

## 2022-11-10 MED ORDER — LACTULOSE 10 GM/15ML PO SOLN
20.0000 g | Freq: Every day | ORAL | 2 refills | Status: DC
Start: 2022-11-10 — End: 2022-12-22

## 2022-11-10 NOTE — Progress Notes (Unsigned)
Katrinka Blazing, [name] Gastroenterology & Hepatology [facility] Gastroenterology [address] [place], [place] [address] Primary Care Physician: Dolores Frame, MD [address] [place] [address]  Referring MD: Carylon Perches, MD  Chief Complaint: Evaluation for possible cirrhosis  History of Present Illness: CHINEMEREM ALDABA is a 82 y.o. male with past medical history of AAA, carotid stenosis, CKD stage IIIb, coronary artery disease status post CABG, hypertension, GERD, hyperlipidemia, atrial fibrillation, OSA, CHF with ejection fraction of 45% who presents for evaluation of cirrhosis and ascites.  Patient has had a long standing history of abdominal distention intermittently for a few years. However, his family member states that around July 2023 he had a pneumonia infection, which led to a severe episode of ascites, managed with diuretics. However, he has never been on spironolactone.  The patient denies having any nausea, vomiting, fever, chills, hematochezia, melena, hematemesis, abdominal distention, abdominal pain, diarrhea, jaundice, pruritus or weight loss.  Patient had an abdominal ultrasound on 09/22/2022 which showed moderate ascites and liver changes suggestive of hepatic cirrhosis without focal hepatic lesion, there was a 3 mm small gallbladder polyp.  Patient is currently followed by cardiology Dr. Anne Fu who has been managing his CHF with Lasix 80 mg twice a day and Jardiance.  He is on Xarelto 15 mg daily.  Patient rarely drank alcohol, quit 20 years ago.  Tries to avoid eating food with salt.  Most recent labs from 08/20/2022 showed a sodium of 147, potassium 3.8, chloride 101, creatinine 1.83, calcium 9.1, TSH 5.6, previous showed no repeat ANA on 12/13/2021.  It appears he has been on amiodarone since 2015.  States having chronic constipation, which may get better with some stool softener and fiber. Stools are usually a  [place] 1 . Sometimes takes Magnesium citrate.  Cirrhosis related questions: Hematemesis/coffee ground emesis: No Abdominal pain: No Abdominal distention/worsening ascitesYes Fever/chills: No Episodes of confusion/disorientation: No Number of daily bowel movements:every 3-4 days Taking diuretics?: Yes, lasix 80 mg BID History of variceal bleeding: No Prior history of banding?: No Prior episodes of SBP: No Last time liver imaging was performed:*** [place] Last AFP: not available MELD score: could not be calculates Currently consuming alcohol: No   Last EGD: 12/30/2017,mid esophagus was dilated,Partial fundal wrap, presence of lipoma in the second portion of the duodenum. Last Colonoscopy: 12/30/2017, small polyps in the cecum which were removed biopsy, diverticulosis and external hemorrhoids. Pathology consistent with 1 tubular adenoma. Recommended repeating a colonoscopy in 7 years  FHx: neg for any gastrointestinal/liver disease, no malignancies Social: neg smoking, alcohol or illicit drug use Surgical: no abdominal surgeries  Past Medical History: Past Medical History:  Diagnosis Date   AAA (abdominal aortic aneurysm) (HCC)    Arthritis    Basal cell carcinoma    Carotid stenosis    Chronic kidney disease, stage 3b (HCC) 12/13/2021   Coronary artery disease    Multvessel s/p CABG 2015   Enlarged prostate    Essential hypertension    GERD (gastroesophageal reflux disease)    Gout    History of colon polyps    History of kidney stones    Hyperlipidemia    Lumbar disc disease    Persistent atrial fibrillation (HCC)    Sleep apnea    CPAP    Past Surgical History: Past Surgical History:  Procedure Laterality Date   ABLATION OF DYSRHYTHMIC FOCUS  07/28/2017   ATRIAL FIBRILLATION ABLATION N/A 07/28/2017   Procedure: ATRIAL  FIBRILLATION ABLATION;  Surgeon: Hillis Range, MD;  [facility]: [facility] INVASIVE CV LAB;  Service: Cardiovascular;  Laterality: N/A;   ATRIAL FIBRILLATION  ABLATION N/A 12/04/2020   Procedure: ATRIAL FIBRILLATION ABLATION;  Surgeon: Hillis Range, MD;  Location: [facility] INVASIVE CV LAB;  Service: Cardiovascular;  Laterality: N/A;   BACK SURGERY     CARDIAC CATHETERIZATION  05/22/2014   Procedure: IABP INSERTION;  Surgeon: Marykay Lex, MD;  Location: [facility] CATH LAB;  Service: Cardiovascular;;   CARDIOVERSION N/A 04/29/2017   Procedure: CARDIOVERSION;  Surgeon: Jonelle Sidle, MD;  Location: [facility] ENDO SUITE;  Service: Cardiovascular;  Laterality: N/A;   CARDIOVERSION N/A 08/13/2017   Procedure: CARDIOVERSION;  Surgeon: Chrystie Nose, MD;  Location: [facility] ENDOSCOPY;  Service: Cardiovascular;  Laterality: N/A;   CARDIOVERSION N/A 08/15/2019   Procedure: CARDIOVERSION;  Surgeon: Lars Masson, MD;  Location: [facility] ENDOSCOPY;  Service: Cardiovascular;  Laterality: N/A;   CARDIOVERSION N/A 03/09/2020   Procedure: CARDIOVERSION;  Surgeon: Jodelle Red, MD;  Location: [facility] ENDOSCOPY;  Service: Cardiovascular;  Laterality: N/A;   CARDIOVERSION N/A 07/06/2020   Procedure: CARDIOVERSION;  Surgeon: Thurmon Fair, MD;  Location: [facility] ENDOSCOPY;  Service: Cardiovascular;  Laterality: N/A;   CARDIOVERSION N/A 01/18/2021   Procedure: CARDIOVERSION;  Surgeon: Chilton Si, MD;  Location: [facility] ENDOSCOPY;  Service: Cardiovascular;  Laterality: N/A;   CARDIOVERSION N/A 06/17/2021   Procedure: CARDIOVERSION;  Surgeon: Sande Rives, MD;  Location: [facility] ENDOSCOPY;  Service: Cardiovascular;  Laterality: N/A;   Cataract surgery Right    COLONOSCOPY     COLONOSCOPY N/A 12/30/2017   Procedure: COLONOSCOPY;  Surgeon: Malissa Hippo, MD;  [facility] ENDO [facility];  Service: Endoscopy;  Laterality: N/A;   CORONARY ARTERY BYPASS GRAFT N/A 05/22/2014   Procedure: CORONARY ARTERY BYPASS GRAFTING (CABG) times three using left internal mammary and right saphenous vein.;  Surgeon: Loreli Slot, MD;  Location: [facility] OR;  Service: Open Heart Surgery;  Laterality: N/A;    ENDARTERECTOMY Left 02/17/2014   Procedure: ENDARTERECTOMY CAROTID WITH PATCH ANGIOPLASTY;  Surgeon: Pryor Ochoa, MD;  Location: [facility] OR;  Service: Vascular;  Laterality: Left;   ESOPHAGEAL DILATION N/A 08/22/2015   Procedure: ESOPHAGEAL DILATION;  Surgeon: Malissa Hippo, MD;  [facility] ENDO [facility];  Service: Endoscopy;  Laterality: N/A;   ESOPHAGOGASTRODUODENOSCOPY N/A 08/22/2015   Procedure: ESOPHAGOGASTRODUODENOSCOPY (EGD);  Surgeon: Malissa Hippo, MD;  [facility] ENDO [facility];  Service: Endoscopy;  Laterality: N/A;  12:45 - moved to 1:55 - Ann notified pt   ESOPHAGOGASTRODUODENOSCOPY N/A 12/30/2017   Procedure: ESOPHAGOGASTRODUODENOSCOPY (EGD);  Surgeon: Malissa Hippo, MD;  Location: [facility] ENDO [facility];  Service: Endoscopy;  Laterality: N/A;  200   EYE SURGERY     cataract extraction (right) with repair macular tear , with IOL     right   FRACTURE SURGERY     bilateral wrist fractures- one ORIF   JOINT REPLACEMENT  2011   left knee   LEFT HEART CATHETERIZATION WITH CORONARY ANGIOGRAM N/A 05/22/2014   Procedure: LEFT HEART CATHETERIZATION WITH CORONARY ANGIOGRAM;  Surgeon: Marykay Lex, MD;  Location: [facility] CATH LAB;  Service: Cardiovascular;  Laterality: N/A;   LITHOTRIPSY     PARS PLANA VITRECTOMY W/ REPAIR OF MACULAR HOLE     RHINOPLASTY     TEE WITHOUT CARDIOVERSION N/A 04/29/2017   Procedure: TRANSESOPHAGEAL ECHOCARDIOGRAM (TEE) WITH PROPOFOL;  Surgeon: Jonelle Sidle, MD;  Location: [facility] ENDO [facility];  Service: Cardiovascular;  Laterality: N/A;   TONSILLECTOMY  TOTAL HIP ARTHROPLASTY  12/08/2011   Procedure: TOTAL HIP ARTHROPLASTY;  Surgeon: Loanne Drilling, MD;  Location: [facility] ORS;  Service: Orthopedics;  Laterality: Right;   UPPER GI ENDOSCOPY  12/18/2015   Procedure: UPPER GI ENDOSCOPY;  Surgeon: Axel Filler, MD;  Location: [facility] ORS;  Service: General;;   WRIST SURGERY Right 78yrs ago   WRIST SURGERY Left     Family History: Family History  Problem Relation Age of  Onset   Allergies Mother    Heart disease Mother    Hypertension Mother    Heart disease Father        MI    Social History: Social History   Tobacco Use  Smoking Status Former   Packs/day: 1.50   Years: 25.00   Additional pack years: 0.00   Total pack years: 37.50   Types: Cigarettes   Start date: 08/08/1960   Quit date: 06/02/1982   Years since quitting: 40.4   Passive exposure: Never  Smokeless Tobacco Never  Tobacco Comments   quit smoking 30+yrs ago   Social History   Substance and Sexual Activity  Alcohol Use No   Alcohol/week: 0.0 standard drinks of alcohol   Comment: occasionally    Social History   Substance and Sexual Activity  Drug Use No    Allergies: Allergies  Allergen Reactions   Codeine Sulfate Nausea Only    Medications: Current Outpatient Medications  Medication Sig Dispense Refill   acetaminophen (TYLENOL) 500 MG tablet Take 1,000 mg by mouth every 6 (six) hours as needed for moderate pain.     amiodarone (PACERONE) 200 MG tablet Take 1 tablet (200 mg total) by mouth daily. 90 tablet 1   amoxicillin (AMOXIL) 500 MG capsule Take 2,000 mg by mouth See admin instructions. Take 4 capsules (2000 mg) by mouth before dental procedures     atorvastatin (LIPITOR) 80 MG tablet TAKE 1 TABLET BY MOUTH EVERY DAY IN THE EVENING 90 tablet 3   cholecalciferol (VITAMIN D3) 25 MCG (1000 UNIT) tablet Take 1,000 Units by mouth daily.     colchicine 0.6 MG tablet Take 0.6 mg by mouth daily as needed (for gout flare up).     cyanocobalamin (VITAMIN B12) 1000 MCG tablet Take 1,000 mcg by mouth daily.     doxazosin (CARDURA) 4 MG tablet Take 4 mg by mouth in the morning.  4   empagliflozin (JARDIANCE) 10 MG TABS tablet Take 1 tablet (10 mg total) by mouth daily before breakfast. 30 tablet 11   finasteride (PROSCAR) 5 MG tablet Take 5 mg by mouth every evening.      fluticasone (FLONASE) 50 MCG/ACT nasal spray Place 1 spray into both nostrils daily as needed for  allergies or rhinitis.     furosemide (LASIX) 80 MG tablet Take 1 tablet (80 mg total) by mouth 2 (two) times daily. 180 tablet 3   lisinopril (ZESTRIL) 40 MG tablet Take 40 mg by mouth daily.     nitroGLYCERIN (NITROSTAT) 0.4 MG SL tablet Place 1 tablet (0.4 mg total) under the tongue every 5 (five) minutes x 3 doses as needed for chest pain. 75 tablet 3   pantoprazole (PROTONIX) 40 MG tablet Take 1 tablet (40 mg total) by mouth daily before breakfast. 90 tablet 3   Rivaroxaban (XARELTO) 15 MG TABS tablet Take 1 tablet (15 mg total) by mouth daily with supper. 30 tablet 1   No current facility-administered medications for this visit.    Review of Systems: GENERAL: negative  for malaise, night sweats HEENT: No changes in hearing or vision, no nose bleeds or other nasal problems. NECK: Negative for lumps, goiter, pain and significant neck swelling RESPIRATORY: Negative for cough, wheezing CARDIOVASCULAR: Negative for chest pain, leg swelling, palpitations, orthopnea GI: SEE HPI MUSCULOSKELETAL: Negative for joint pain or swelling, back pain, and muscle pain. SKIN: Negative for lesions, rash PSYCH: Negative for sleep disturbance, mood disorder and recent psychosocial stressors. HEMATOLOGY Negative for prolonged bleeding, bruising easily, and swollen nodes. ENDOCRINE: Negative for cold or heat intolerance, polyuria, polydipsia and goiter. NEURO: negative for tremor, gait imbalance, syncope and seizures. The remainder of the review of systems is noncontributory.   Physical Exam: BP 125/66 (BP Location: Left Arm, Patient Position: Sitting, Cuff Size: Large)   Pulse 76   Temp (!) 97.3 F (36.3 C) (Temporal)   Ht 5\' 9"  (1.753 m)   Wt 210 lb 3.2 oz (95.3 kg)   BMI 31.04 kg/m  GENERAL: The patient is AO x3, in no acute distress. HEENT: Head is normocephalic and atraumatic. EOMI are intact. Mouth is well hydrated and without lesions. NECK: Supple. No masses LUNGS: Clear to auscultation. No  presence of rhonchi/wheezing/rales. Adequate chest expansion HEART: RRR, normal s1 and s2. ABDOMEN: Soft, nontender, no guarding, no peritoneal signslarge ascites but not tense. BS +. No masses. EXTREMITIES: Without any cyanosis, clubbing, rash, lesions. Edema +2 in both lower extremities NEUROLOGIC: AOx3, no focal motor deficit. SKIN: no jaundice, no rashes   Imaging/Labs: as above  I personally reviewed and interpreted the available labs, imaging and endoscopic files.  Impression and Plan: DONAT GAMLIN is a 82 y.o. male with ***   All questions were answered.      Katrinka Blazing, MD Gastroenterology and Hepatology [facility] Gastroenterology

## 2022-11-10 NOTE — Patient Instructions (Addendum)
-   Perform blood workup in one week - Continue furosemide 80 mg twice a day - Start spironolactone 25 mg qday - Start lactulose 20 g qday - Schedule diagnostic/therapeutic paracentesis - Reduce salt intake to <2 g per day - Can take Tylenol max of 2 g per day (650 mg q8h) for pain - Avoid NSAIDs for pain - Avoid eating raw oysters/shellfish - Protein shake (Ensure or Boost) every night before going to sleep

## 2022-11-11 ENCOUNTER — Telehealth (INDEPENDENT_AMBULATORY_CARE_PROVIDER_SITE_OTHER): Payer: Self-pay

## 2022-11-11 NOTE — Addendum Note (Signed)
Addended by: Dolores Frame on: 11/11/2022 03:43 PM   Modules accepted: Orders

## 2022-11-11 NOTE — Telephone Encounter (Signed)
Thanks

## 2022-11-11 NOTE — Telephone Encounter (Signed)
  Per Dr. Levon Hedger, Can you please resend him by mail the labs ordered yesterday? I added one to his list. please ask him to perform these labs in a week. thanks   I called and spoke with the patient and made him aware that the Doctor had added on something to his labs and I have placed the new orders at the front desk. He will pick these up from [place] when he is due for his lab.

## 2022-11-12 ENCOUNTER — Telehealth (INDEPENDENT_AMBULATORY_CARE_PROVIDER_SITE_OTHER): Payer: Self-pay | Admitting: Gastroenterology

## 2022-11-12 ENCOUNTER — Ambulatory Visit (HOSPITAL_COMMUNITY)
Admission: RE | Admit: 2022-11-12 | Discharge: 2022-11-12 | Disposition: A | Discharge status: Home / Self Care | Payer: Medicare HMO | Source: Ambulatory Visit | Attending: Gastroenterology | Admitting: Gastroenterology

## 2022-11-12 ENCOUNTER — Encounter (HOSPITAL_COMMUNITY): Payer: Self-pay

## 2022-11-12 DIAGNOSIS — R188 Other ascites: Secondary | ICD-10-CM | POA: Insufficient documentation

## 2022-11-12 DIAGNOSIS — K7460 Unspecified cirrhosis of liver: Secondary | ICD-10-CM | POA: Insufficient documentation

## 2022-11-12 LAB — GRAM STAIN: Gram Stain: NONE SEEN

## 2022-11-12 LAB — PROTEIN, PLEURAL OR PERITONEAL FLUID: Total protein, fluid: 4.2 g/dL

## 2022-11-12 LAB — BODY FLUID CELL COUNT WITH DIFFERENTIAL
Eos, Fluid: 0 %
Lymphs, Fluid: 56 %
Monocyte-Macrophage-Serous Fluid: 19 % — ABNORMAL LOW (ref 50–90)
Neutrophil Count, Fluid: 25 % (ref 0–25)
Total Nucleated Cell Count, Fluid: 536 cu mm (ref 0–1000)

## 2022-11-12 LAB — ALBUMIN, PLEURAL OR PERITONEAL FLUID: Albumin, Fluid: 2.1 g/dL

## 2022-11-12 NOTE — Telephone Encounter (Signed)
Pt wife calling in. Pt had paracentesis this morning. Pt wife states the hospital told them to call office and have more orders placed to have paracentesis next week. Please advise. Thank you.

## 2022-11-12 NOTE — Procedures (Signed)
PROCEDURE SUMMARY:  Successful ultrasound guided paracentesis from the right lower quadrant.  Yielded 5 L of clear yellow fluid.  No immediate complications.  The patient tolerated the procedure well.   Specimen sent for labs.  EBL < 2 mL  If the patient eventually requires >/=2 paracenteses in a 30 day period, screening evaluation by the [facility] Interventional Radiology Portal Hypertension Clinic will be assessed.  Alwyn Ren, [place] [phone] 11/12/2022, 11:14 AM

## 2022-11-12 NOTE — Telephone Encounter (Signed)
Please place an order for a repeat therapeutic paracentesis in one week, max 5 L. Thanks

## 2022-11-12 NOTE — Progress Notes (Signed)
Patient tolerated right sided paracentesis well today and 5 Liters of ascites removed with labs collected and sent for processing. Patient verbalized understanding of discharge instructions and left with via wheelchair with no acute distress noted.

## 2022-11-13 ENCOUNTER — Other Ambulatory Visit (INDEPENDENT_AMBULATORY_CARE_PROVIDER_SITE_OTHER): Payer: Self-pay | Admitting: Gastroenterology

## 2022-11-13 DIAGNOSIS — R188 Other ascites: Secondary | ICD-10-CM

## 2022-11-13 LAB — CULTURE, BODY FLUID W GRAM STAIN -BOTTLE

## 2022-11-13 NOTE — Telephone Encounter (Signed)
Left detailed message on wife cell voicemail

## 2022-11-13 NOTE — Telephone Encounter (Signed)
Pt contacted. Appt info given to patient. I had asked to speak to his wife but she was tied up and asked if I would call back to tell wife appt. Informed pt I would call back about 4:30. Pt took down appt info.

## 2022-11-13 NOTE — Telephone Encounter (Signed)
Paracentesis order placed (placed 2 in just in case pt needs another one after next week). Pt scheduled for Caleb Wolf 11/17/22 at 9:00am. Pt to arrive at 8:45am. I will call patient wife this afternoon. Pt wife is having cataract surgery this morning and she is "Careers adviser of the calendar"

## 2022-11-14 LAB — CYTOLOGY - NON PAP

## 2022-11-14 LAB — CULTURE, BODY FLUID W GRAM STAIN -BOTTLE

## 2022-11-17 ENCOUNTER — Encounter (HOSPITAL_COMMUNITY): Payer: Self-pay

## 2022-11-17 ENCOUNTER — Ambulatory Visit (HOSPITAL_COMMUNITY)
Admission: RE | Admit: 2022-11-17 | Discharge: 2022-11-17 | Disposition: A | Discharge status: Home / Self Care | Payer: Medicare HMO | Source: Ambulatory Visit | Attending: Gastroenterology | Admitting: Gastroenterology

## 2022-11-17 VITALS — BP 126/51 | HR 54 | Temp 98.0°F | Resp 16

## 2022-11-17 DIAGNOSIS — R188 Other ascites: Secondary | ICD-10-CM | POA: Insufficient documentation

## 2022-11-17 DIAGNOSIS — J9621 Acute and chronic respiratory failure with hypoxia: Secondary | ICD-10-CM | POA: Diagnosis not present

## 2022-11-17 DIAGNOSIS — K7460 Unspecified cirrhosis of liver: Secondary | ICD-10-CM | POA: Diagnosis not present

## 2022-11-17 DIAGNOSIS — I4891 Unspecified atrial fibrillation: Secondary | ICD-10-CM | POA: Diagnosis not present

## 2022-11-17 DIAGNOSIS — K7031 Alcoholic cirrhosis of liver with ascites: Secondary | ICD-10-CM | POA: Diagnosis not present

## 2022-11-17 LAB — BODY FLUID CELL COUNT WITH DIFFERENTIAL
Eos, Fluid: 0 %
Lymphs, Fluid: 61 %
Monocyte-Macrophage-Serous Fluid: 26 % — ABNORMAL LOW (ref 50–90)
Neutrophil Count, Fluid: 13 % (ref 0–25)
Total Nucleated Cell Count, Fluid: 632 cu mm (ref 0–1000)

## 2022-11-17 LAB — GRAM STAIN: Gram Stain: NONE SEEN

## 2022-11-17 LAB — CULTURE, BODY FLUID W GRAM STAIN -BOTTLE: Culture: NO GROWTH

## 2022-11-17 MED ORDER — ALBUMIN HUMAN 25 % IV SOLN
0.0000 g | Freq: Once | INTRAVENOUS | Status: AC
  Administered 2022-11-17: 50 g via INTRAVENOUS
  Filled 2022-11-17: qty 400

## 2022-11-17 MED ORDER — ALBUMIN HUMAN 25 % IV SOLN
INTRAVENOUS | Status: AC
  Filled 2022-11-17: qty 200

## 2022-11-17 NOTE — Procedures (Signed)
PROCEDURE SUMMARY:  Successful image-guided paracentesis from the right lower abdomen.  Yielded 5 liters of cloudy yellow fluid.  No immediate complications.  EBL = trace. Patient tolerated well.   Specimen was sent for labs.  Please see imaging section of Epic for full dictation.   Kennieth Francois PA-C 11/17/2022 9:40 AM

## 2022-11-17 NOTE — Progress Notes (Signed)
Patient tolerated right sided paracentesis and 50G of IV albumin well today and 5 Liters of cloudy yellow ascites removed with labs collected and sent for processing. Patient verbalized understanding of discharge instructions and left via wheelchair with wife with no acute distress noted.

## 2022-11-18 DIAGNOSIS — K7460 Unspecified cirrhosis of liver: Secondary | ICD-10-CM | POA: Diagnosis not present

## 2022-11-18 DIAGNOSIS — R188 Other ascites: Secondary | ICD-10-CM | POA: Diagnosis not present

## 2022-11-18 LAB — CBC WITH DIFFERENTIAL/PLATELET
Basophils Absolute: 38 cells/uL (ref 0–200)
MCH: 27.3 pg (ref 27.0–33.0)
MCHC: 31.4 g/dL — ABNORMAL LOW (ref 32.0–36.0)
MCV: 87.1 fL (ref 80.0–100.0)
Monocytes Relative: 9.2 %
RDW: 13.9 % (ref 11.0–15.0)
Total Lymphocyte: 12.5 %

## 2022-11-18 LAB — CULTURE, BODY FLUID W GRAM STAIN -BOTTLE: Culture: NO GROWTH

## 2022-11-18 LAB — CYTOLOGY - NON PAP

## 2022-11-19 LAB — COMPREHENSIVE METABOLIC PANEL
BUN/Creatinine Ratio: 12 (calc) (ref 6–22)
Creat: 2.17 mg/dL — ABNORMAL HIGH (ref 0.70–1.22)
Total Bilirubin: 0.4 mg/dL (ref 0.2–1.2)

## 2022-11-19 LAB — CULTURE, BODY FLUID W GRAM STAIN -BOTTLE

## 2022-11-19 LAB — IRON,TIBC AND FERRITIN PANEL: TIBC: 178 mcg/dL (calc) — ABNORMAL LOW (ref 250–425)

## 2022-11-19 LAB — CBC WITH DIFFERENTIAL/PLATELET: Neutro Abs: 5685 cells/uL (ref 1500–7800)

## 2022-11-20 ENCOUNTER — Ambulatory Visit (HOSPITAL_COMMUNITY)
Admission: RE | Admit: 2022-11-20 | Discharge: 2022-11-20 | Disposition: A | Discharge status: Home / Self Care | Payer: Medicare HMO | Source: Ambulatory Visit | Attending: Gastroenterology | Admitting: Gastroenterology

## 2022-11-20 ENCOUNTER — Other Ambulatory Visit (INDEPENDENT_AMBULATORY_CARE_PROVIDER_SITE_OTHER): Payer: Self-pay | Admitting: Gastroenterology

## 2022-11-20 ENCOUNTER — Telehealth (INDEPENDENT_AMBULATORY_CARE_PROVIDER_SITE_OTHER): Payer: Self-pay

## 2022-11-20 ENCOUNTER — Encounter (HOSPITAL_COMMUNITY): Payer: Self-pay

## 2022-11-20 VITALS — BP 151/44 | HR 51 | Temp 98.0°F | Resp 18

## 2022-11-20 DIAGNOSIS — R8569 Abnormal cytological findings in specimens from other digestive organs and abdominal cavity: Secondary | ICD-10-CM | POA: Diagnosis not present

## 2022-11-20 DIAGNOSIS — R188 Other ascites: Secondary | ICD-10-CM

## 2022-11-20 DIAGNOSIS — K7031 Alcoholic cirrhosis of liver with ascites: Secondary | ICD-10-CM | POA: Insufficient documentation

## 2022-11-20 DIAGNOSIS — K7460 Unspecified cirrhosis of liver: Secondary | ICD-10-CM | POA: Diagnosis not present

## 2022-11-20 DIAGNOSIS — Z85828 Personal history of other malignant neoplasm of skin: Secondary | ICD-10-CM | POA: Diagnosis not present

## 2022-11-20 LAB — BODY FLUID CELL COUNT WITH DIFFERENTIAL
Eos, Fluid: 0 %
Lymphs, Fluid: 73 %
Monocyte-Macrophage-Serous Fluid: 15 % — ABNORMAL LOW (ref 50–90)
Neutrophil Count, Fluid: 12 % (ref 0–25)
Total Nucleated Cell Count, Fluid: 748 cu mm (ref 0–1000)

## 2022-11-20 LAB — GRAM STAIN: Gram Stain: NONE SEEN

## 2022-11-20 MED ORDER — ALBUMIN HUMAN 25 % IV SOLN
0.0000 g | Freq: Once | INTRAVENOUS | Status: AC
  Administered 2022-11-20: 50 g via INTRAVENOUS

## 2022-11-20 MED ORDER — ALBUMIN HUMAN 25 % IV SOLN
INTRAVENOUS | Status: AC
  Filled 2022-11-20: qty 200

## 2022-11-20 NOTE — Telephone Encounter (Signed)
Thanks. Hi Ann, can you please refer the patient to interventional radiology -portal hypertension clinic, evaluation for possible TIPS? Hi Tanya, can you please put a standing order for weekly paracentesis as needed? Dx: Recurrent paracentesis.  Thanks,  Katrinka Blazing, MD Gastroenterology and Hepatology [facility] Gastroenterology

## 2022-11-20 NOTE — Procedures (Signed)
PROCEDURE SUMMARY:  Successful US guided diagnostic and therapeutic paracentesis from LLQ.  Yielded 5 L of cloudy, yellow fluid.  No immediate complications.  Pt tolerated well.   Specimen was sent for labs.  EBL < 1 mL  Shon Hough, AGNP 11/20/2022 10:09 AM

## 2022-11-20 NOTE — Telephone Encounter (Signed)
Paracentesis ordered and scheduled for 11/27/22 at 10:00am;arrive at 9:30 for check in. Left message to return call

## 2022-11-20 NOTE — Progress Notes (Signed)
Patient tolerated left sided paracentesis and 50G of IV albumin well today and 5 Liters of cloudy yellow ascites removed with labs collected and sent for processing. Patient verbalized understanding of discharge instructions and left via wheelchair with family at this time with no acute distress noted.

## 2022-11-20 NOTE — Telephone Encounter (Signed)
Patient wife came by the office saying the patient has had three paracentesis with the last one done today. She says the patient will need another in a week. Please advise.

## 2022-11-21 LAB — CULTURE, BODY FLUID W GRAM STAIN -BOTTLE

## 2022-11-21 LAB — MITOCHONDRIAL ANTIBODIES: Mitochondrial M2 Ab, IgG: <=20 U (ref <=20.0)

## 2022-11-21 LAB — IGG: IgG (Immunoglobin G), Serum: 782 mg/dL (ref 600–1540)

## 2022-11-21 LAB — COMPREHENSIVE METABOLIC PANEL
Albumin: 3.3 g/dL — ABNORMAL LOW (ref 3.6–5.1)
Alkaline phosphatase (APISO): 70 U/L (ref 35–144)
CO2: 35 mmol/L — ABNORMAL HIGH (ref 20–32)
Globulin: 2.3 g/dL (calc) (ref 1.9–3.7)
Total Protein: 5.6 g/dL — ABNORMAL LOW (ref 6.1–8.1)

## 2022-11-21 LAB — CBC WITH DIFFERENTIAL/PLATELET
Absolute Monocytes: 690 cells/uL (ref 200–950)
Eosinophils Relative: 2 %
Platelets: 353 10*3/uL (ref 140–400)
WBC: 7.5 10*3/uL (ref 3.8–10.8)

## 2022-11-21 LAB — AFP TUMOR MARKER: AFP-Tumor Marker: 2.8 ng/mL (ref <6.1)

## 2022-11-21 NOTE — Telephone Encounter (Signed)
Noted, Thanks

## 2022-11-21 NOTE — Telephone Encounter (Signed)
Spouse called back and she is aware of appt details. She voiced understanding and had no questions.

## 2022-11-22 LAB — CULTURE, BODY FLUID W GRAM STAIN -BOTTLE

## 2022-11-24 ENCOUNTER — Telehealth (INDEPENDENT_AMBULATORY_CARE_PROVIDER_SITE_OTHER): Payer: Self-pay | Admitting: Gastroenterology

## 2022-11-24 ENCOUNTER — Other Ambulatory Visit: Payer: Self-pay | Admitting: Gastroenterology

## 2022-11-24 DIAGNOSIS — K766 Portal hypertension: Secondary | ICD-10-CM

## 2022-11-24 LAB — CBC WITH DIFFERENTIAL/PLATELET
Basophils Relative: 0.5 %
Eosinophils Absolute: 150 cells/uL (ref 15–500)
HCT: 34.4 % — ABNORMAL LOW (ref 38.5–50.0)
Hemoglobin: 10.8 g/dL — ABNORMAL LOW (ref 13.2–17.1)
Lymphs Abs: 938 cells/uL (ref 850–3900)
MPV: 9.6 fL (ref 7.5–12.5)
Neutrophils Relative %: 75.8 %
RBC: 3.95 10*6/uL — ABNORMAL LOW (ref 4.20–5.80)

## 2022-11-24 LAB — COMPREHENSIVE METABOLIC PANEL
AG Ratio: 1.4 (calc) (ref 1.0–2.5)
ALT: 11 U/L (ref 9–46)
AST: 13 U/L (ref 10–35)
BUN: 26 mg/dL — ABNORMAL HIGH (ref 7–25)
Calcium: 8.6 mg/dL (ref 8.6–10.3)
Chloride: 99 mmol/L (ref 98–110)
Glucose, Bld: 103 mg/dL — ABNORMAL HIGH (ref 65–99)
Potassium: 4 mmol/L (ref 3.5–5.3)
Sodium: 140 mmol/L (ref 135–146)

## 2022-11-24 LAB — HEPATITIS PANEL, ACUTE
Hep A IgM: NONREACTIVE
Hep B C IgM: NONREACTIVE
Hepatitis B Surface Ag: NONREACTIVE
Hepatitis C Ab: NONREACTIVE

## 2022-11-24 LAB — ANTI-SMOOTH MUSCLE ANTIBODY, IGG: Actin (Smooth Muscle) Antibody (IGG): <20 U (ref <20)

## 2022-11-24 LAB — PROTIME-INR
INR: 1.3 — ABNORMAL HIGH
Prothrombin Time: 13.3 s — ABNORMAL HIGH (ref 9.0–11.5)

## 2022-11-24 LAB — CULTURE, BODY FLUID W GRAM STAIN -BOTTLE: Culture: NO GROWTH

## 2022-11-24 LAB — IRON,TIBC AND FERRITIN PANEL
%SAT: 18 % (calc) — ABNORMAL LOW (ref 20–48)
Ferritin: 136 ng/mL (ref 24–380)
Iron: 32 ug/dL — ABNORMAL LOW (ref 50–180)

## 2022-11-24 LAB — CYTOLOGY - NON PAP

## 2022-11-24 NOTE — Telephone Encounter (Signed)
Thank you :)

## 2022-11-24 NOTE — Telephone Encounter (Signed)
Referral placed in Epic and message to sent to scheduler, they will contact patient with apt

## 2022-11-24 NOTE — Telephone Encounter (Signed)
Pt wife contacted and states that paracentesis is to be done on a Tuesday instead of a Thursday. Advised pt to call central scheduling to reschedule. Gave pt central scheduling number.

## 2022-11-25 ENCOUNTER — Ambulatory Visit (HOSPITAL_COMMUNITY)
Admission: RE | Admit: 2022-11-25 | Discharge: 2022-11-25 | Disposition: A | Discharge status: Home / Self Care | Payer: Medicare HMO | Source: Ambulatory Visit | Attending: Gastroenterology | Admitting: Gastroenterology

## 2022-11-25 ENCOUNTER — Encounter (HOSPITAL_COMMUNITY): Payer: Self-pay

## 2022-11-25 VITALS — BP 143/52 | HR 50 | Temp 98.0°F | Resp 18

## 2022-11-25 DIAGNOSIS — R188 Other ascites: Secondary | ICD-10-CM

## 2022-11-25 DIAGNOSIS — R8569 Abnormal cytological findings in specimens from other digestive organs and abdominal cavity: Secondary | ICD-10-CM | POA: Diagnosis not present

## 2022-11-25 DIAGNOSIS — K7460 Unspecified cirrhosis of liver: Secondary | ICD-10-CM | POA: Diagnosis not present

## 2022-11-25 LAB — BODY FLUID CELL COUNT WITH DIFFERENTIAL
Eos, Fluid: 0 %
Lymphs, Fluid: 86 %
Monocyte-Macrophage-Serous Fluid: 5 % — ABNORMAL LOW (ref 50–90)
Neutrophil Count, Fluid: 9 % (ref 0–25)
Total Nucleated Cell Count, Fluid: 1601 cu mm — ABNORMAL HIGH (ref 0–1000)

## 2022-11-25 LAB — GRAM STAIN

## 2022-11-25 MED ORDER — ALBUMIN HUMAN 25 % IV SOLN
INTRAVENOUS | Status: AC
  Filled 2022-11-25: qty 200

## 2022-11-25 MED ORDER — ALBUMIN HUMAN 25 % IV SOLN
0.0000 g | Freq: Once | INTRAVENOUS | Status: AC
  Administered 2022-11-25: 50 g via INTRAVENOUS
  Filled 2022-11-25: qty 400

## 2022-11-25 NOTE — Progress Notes (Signed)
Patient tolerated right sided paracentesis and 50G of IV albumin well today and 2.4 liters of cloudy yellow ascites removed with labs collected and sent for processing. Patient verbalized understanding of discharge instructions and left with wife via wheelchair a this time with no acute distress noted.

## 2022-11-26 LAB — CYTOLOGY - NON PAP

## 2022-11-26 LAB — CULTURE, BODY FLUID W GRAM STAIN -BOTTLE

## 2022-11-27 ENCOUNTER — Telehealth: Payer: Self-pay | Admitting: Internal Medicine

## 2022-11-27 ENCOUNTER — Ambulatory Visit (HOSPITAL_COMMUNITY): Admission: RE | Admit: 2022-11-27 | Payer: Medicare HMO | Source: Ambulatory Visit

## 2022-11-27 NOTE — Telephone Encounter (Signed)
Pt calling in bc he wants to know whether or not Dr. Maple Hudson receives his reading from the CPAP

## 2022-11-28 LAB — CULTURE, BODY FLUID W GRAM STAIN -BOTTLE

## 2022-11-30 LAB — CULTURE, BODY FLUID W GRAM STAIN -BOTTLE: Culture: NO GROWTH

## 2022-12-01 ENCOUNTER — Ambulatory Visit
Admission: RE | Admit: 2022-12-01 | Discharge: 2022-12-01 | Disposition: A | Discharge status: Home / Self Care | Payer: Medicare HMO | Source: Ambulatory Visit | Attending: Gastroenterology | Admitting: Gastroenterology

## 2022-12-01 DIAGNOSIS — I714 Abdominal aortic aneurysm, without rupture, unspecified: Secondary | ICD-10-CM | POA: Diagnosis not present

## 2022-12-01 DIAGNOSIS — K766 Portal hypertension: Secondary | ICD-10-CM | POA: Diagnosis not present

## 2022-12-01 DIAGNOSIS — K219 Gastro-esophageal reflux disease without esophagitis: Secondary | ICD-10-CM | POA: Diagnosis not present

## 2022-12-01 DIAGNOSIS — N1832 Chronic kidney disease, stage 3b: Secondary | ICD-10-CM | POA: Diagnosis not present

## 2022-12-01 DIAGNOSIS — I4891 Unspecified atrial fibrillation: Secondary | ICD-10-CM | POA: Diagnosis not present

## 2022-12-01 HISTORY — PX: IR RADIOLOGIST EVAL & MGMT: IMG5224

## 2022-12-01 NOTE — H&P (Signed)
Reason for visit: Cirrhosis. TIPS evaluation.   Care Team: Primary Care; Marguerita Merles, Reuel Boom, MD Gastroenterology;  Dolores Frame, MD  Pulmonary; Waymon Budge, MD  Cardiology; Jake Bathe, MD    History of Present Illness:  Caleb Wolf is a 82 y.o. male comorbid including a PMHx significant for Afib s/p ablation x2, CAD s/p CABG and CHF w EF ~45%. Pt reports remote EtOH Hx, quit 20 yrs prior however noted increasing abdominal distention then SOB. He was found to have new onset ascites in 11/12/22. He has since had serial paracenteses of ~5L, with a total of 4 at the time of this writing. Pt is followed closely by his Gastroenterology ([facility]) team in [place] and was referred for TIPS evaluation given his decompensation. He is also seen by his Cardiologist and is on diuretic therapy.  He is joined in clinic today by his spouse, Caleb Wolf, who is a reliable historian and reports that Mr Haggart has intermittent confusion and that his encephalopathy is well regulated by his lactulose. They deny any thoracenteses, anemia or bleeding episodes.  Review of Systems: A 12-point ROS discussed, and pertinent positives are indicated in the HPI above.  All other systems are negative.  Past Medical History:  Diagnosis Date   AAA (abdominal aortic aneurysm) (HCC)    Arthritis    Basal cell carcinoma    Carotid stenosis    Chronic kidney disease, stage 3b (HCC) 12/13/2021   Coronary artery disease    Multvessel s/p CABG 2015   Enlarged prostate    Essential hypertension    GERD (gastroesophageal reflux disease)    Gout    History of colon polyps    History of kidney stones    Hyperlipidemia    Lumbar disc disease    Persistent atrial fibrillation (HCC)    Sleep apnea    CPAP    Past Surgical History:  Procedure Laterality Date   ABLATION OF DYSRHYTHMIC FOCUS  07/28/2017   ATRIAL FIBRILLATION ABLATION N/A 07/28/2017   Procedure: ATRIAL FIBRILLATION  ABLATION;  Surgeon: Hillis Range, MD;  Location: [facility] INVASIVE CV LAB;  Service: Cardiovascular;  Laterality: N/A;   ATRIAL FIBRILLATION ABLATION N/A 12/04/2020   Procedure: ATRIAL FIBRILLATION ABLATION;  Surgeon: Hillis Range, MD;  Location: [facility] INVASIVE CV LAB;  Service: Cardiovascular;  Laterality: N/A;   BACK SURGERY     CARDIAC CATHETERIZATION  05/22/2014   Procedure: IABP INSERTION;  Surgeon: Marykay Lex, MD;  Location: [facility] CATH LAB;  Service: Cardiovascular;;   CARDIOVERSION N/A 04/29/2017   Procedure: CARDIOVERSION;  Surgeon: Jonelle Sidle, MD;  Location: [facility] ENDO SUITE;  Service: Cardiovascular;  Laterality: N/A;   CARDIOVERSION N/A 08/13/2017   Procedure: CARDIOVERSION;  Surgeon: Chrystie Nose, MD;  Location: [facility] ENDOSCOPY;  Service: Cardiovascular;  Laterality: N/A;   CARDIOVERSION N/A 08/15/2019   Procedure: CARDIOVERSION;  Surgeon: Lars Masson, MD;  Location: [facility] ENDOSCOPY;  Service: Cardiovascular;  Laterality: N/A;   CARDIOVERSION N/A 03/09/2020   Procedure: CARDIOVERSION;  Surgeon: Jodelle Red, MD;  Location: [facility] ENDOSCOPY;  Service: Cardiovascular;  Laterality: N/A;   CARDIOVERSION N/A 07/06/2020   Procedure: CARDIOVERSION;  Surgeon: Thurmon Fair, MD;  Location: [facility] ENDOSCOPY;  Service: Cardiovascular;  Laterality: N/A;   CARDIOVERSION N/A 01/18/2021   Procedure: CARDIOVERSION;  Surgeon: Chilton Si, MD;  Location: [facility] ENDOSCOPY;  Service: Cardiovascular;  Laterality: N/A;   CARDIOVERSION N/A 06/17/2021   Procedure: CARDIOVERSION;  Surgeon: Sande Rives, MD;  Location: [facility] ENDOSCOPY;  Service: Cardiovascular;  Laterality: N/A;   Cataract surgery Right    COLONOSCOPY     COLONOSCOPY N/A 12/30/2017   Procedure: COLONOSCOPY;  Surgeon: Malissa Hippo, MD;  [facility];  Service: Endoscopy;  Laterality: N/A;   CORONARY ARTERY BYPASS GRAFT N/A 05/22/2014   Procedure: CORONARY ARTERY BYPASS GRAFTING (CABG) times three using left internal  mammary and right saphenous vein.;  Surgeon: Loreli Slot, MD;  Location: [facility] OR;  Service: Open Heart Surgery;  Laterality: N/A;   ENDARTERECTOMY Left 02/17/2014   Procedure: ENDARTERECTOMY CAROTID WITH PATCH ANGIOPLASTY;  Surgeon: Pryor Ochoa, MD;  Location: [facility] OR;  Service: Vascular;  Laterality: Left;   ESOPHAGEAL DILATION N/A 08/22/2015   Procedure: ESOPHAGEAL DILATION;  Surgeon: Malissa Hippo, MD;  [facility];  Service: Endoscopy;  Laterality: N/A;   ESOPHAGOGASTRODUODENOSCOPY N/A 08/22/2015   Procedure: ESOPHAGOGASTRODUODENOSCOPY (EGD);  Surgeon: Malissa Hippo, MD;  [facility];  Service: Endoscopy;  Laterality: N/A;  12:45 - moved to 1:55 - Ann notified pt   ESOPHAGOGASTRODUODENOSCOPY N/A 12/30/2017   Procedure: ESOPHAGOGASTRODUODENOSCOPY (EGD);  Surgeon: Malissa Hippo, MD;  [facility];  Service: Endoscopy;  Laterality: N/A;  200   EYE SURGERY     cataract extraction (right) with repair macular tear , with IOL     right   FRACTURE SURGERY     bilateral wrist fractures- one ORIF   IR RADIOLOGIST EVAL & MGMT  12/01/2022   JOINT REPLACEMENT  2011   left knee   LEFT HEART CATHETERIZATION WITH CORONARY ANGIOGRAM N/A 05/22/2014   Procedure: LEFT HEART CATHETERIZATION WITH CORONARY ANGIOGRAM;  Surgeon: Marykay Lex, MD;  Location: [facility] CATH LAB;  Service: Cardiovascular;  Laterality: N/A;   LITHOTRIPSY     PARS PLANA VITRECTOMY W/ REPAIR OF MACULAR HOLE     RHINOPLASTY     TEE WITHOUT CARDIOVERSION N/A 04/29/2017   Procedure: TRANSESOPHAGEAL ECHOCARDIOGRAM (TEE) WITH PROPOFOL;  Surgeon: Jonelle Sidle, MD;  Location: [facility];  Service: Cardiovascular;  Laterality: N/A;   TONSILLECTOMY     TOTAL HIP ARTHROPLASTY  12/08/2011   Procedure: TOTAL HIP ARTHROPLASTY;  Surgeon: Loanne Drilling, MD;  Location: [facility] ORS;  Service: Orthopedics;  Laterality: Right;   UPPER GI ENDOSCOPY  12/18/2015   Procedure: UPPER GI ENDOSCOPY;  Surgeon: Axel Filler, MD;  Location: [facility] ORS;  Service: General;;   WRIST SURGERY Right 22yrs ago   WRIST SURGERY Left     Allergies: Codeine sulfate  Medications: Prior to Admission medications   Medication Sig Start Date End Date Taking? Authorizing Provider  acetaminophen (TYLENOL) 500 MG tablet Take 1,000 mg by mouth every 6 (six) hours as needed for moderate pain.    [provider]  amiodarone (PACERONE) 200 MG tablet Take 1 tablet (200 mg total) by mouth daily. 10/24/22   Jake Bathe, MD  amoxicillin (AMOXIL) 500 MG capsule Take 2,000 mg by mouth See admin instructions. Take 4 capsules (2000 mg) by mouth before dental procedures 01/24/20   [provider]  atorvastatin (LIPITOR) 80 MG tablet TAKE 1 TABLET BY MOUTH EVERY DAY IN THE EVENING 04/03/22   Jake Bathe, MD  cholecalciferol (VITAMIN D3) 25 MCG (1000 UNIT) tablet Take 1,000 Units by mouth daily.    [provider]  colchicine 0.6 MG tablet Take 0.6 mg by mouth daily as needed (for gout flare up).    [provider]  cyanocobalamin (VITAMIN B12)  1000 MCG tablet Take 1,000 mcg by mouth daily.    [provider]  doxazosin (CARDURA) 4 MG tablet Take 4 mg by mouth in the morning. 07/06/17   [provider]  empagliflozin (JARDIANCE) 10 MG TABS tablet Take 1 tablet (10 mg total) by mouth daily before breakfast. 06/11/22   Jake Bathe, MD  finasteride (PROSCAR) 5 MG tablet Take 5 mg by mouth every evening.     [provider]  fluticasone (FLONASE) 50 MCG/ACT nasal spray Place 1 spray into both nostrils daily as needed for allergies or rhinitis.    [provider]  furosemide (LASIX) 80 MG tablet Take 1 tablet (80 mg total) by mouth 2 (two) times daily. 06/11/22   Jake Bathe, MD  lactulose (CHRONULAC) 10 GM/15ML solution Take 30 mLs (20 g total) by mouth daily. Can increase to twice a day depending on constipation improvement 11/10/22   Marguerita Merles, Reuel Boom, MD   lisinopril (ZESTRIL) 40 MG tablet Take 40 mg by mouth daily.    [provider]  nitroGLYCERIN (NITROSTAT) 0.4 MG SL tablet Place 1 tablet (0.4 mg total) under the tongue every 5 (five) minutes x 3 doses as needed for chest pain. 06/04/20   Allred, Fayrene Fearing, MD  pantoprazole (PROTONIX) 40 MG tablet Take 1 tablet (40 mg total) by mouth daily before breakfast. 02/19/16   Rehman, Joline Maxcy, MD  Rivaroxaban (XARELTO) 15 MG TABS tablet Take 1 tablet (15 mg total) by mouth daily with supper. 12/16/21   Catarina Hartshorn, MD  spironolactone (ALDACTONE) 25 MG tablet Take 1 tablet (25 mg total) by mouth daily. 11/10/22   Dolores Frame, MD     Family History  Problem Relation Age of Onset   Allergies Mother    Heart disease Mother    Hypertension Mother    Heart disease Father        MI    Social History   Socioeconomic History   Marital status: Married    Spouse name: Not on file   Number of children: Not on file   Years of education: Not on file   Highest education level: Not on file  Occupational History   Occupation: retired    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  Tobacco Use   Smoking status: Former    Current packs/day: 0.00    Average packs/day: 1.5 packs/day for 25.0 years (37.5 ttl pk-yrs)    Types: Cigarettes    Start date: 08/08/1960    Quit date: 06/02/1982    Years since quitting: 40.5    Passive exposure: Never   Smokeless tobacco: Never   Tobacco comments:    quit smoking 30+yrs ago  Vaping Use   Vaping status: Never Used  Substance and Sexual Activity   Alcohol use: No    Alcohol/week: 0.0 standard drinks of alcohol    Comment: occasionally    Drug use: No   Sexual activity: Yes  Other Topics Concern   Not on file  Social History Narrative   Not on file   Social Determinants of Health   Financial Resource Strain: Not on file  Food Insecurity: Not on file  Transportation Needs: Not on file  Physical Activity: Not on file  Stress: Not on file  Social  Connections: Not on file    Review of Systems As above  Vital Signs: BP (!) 98/43 (BP Location: Left Arm)   Pulse (!) 55   Temp 98.2 F (36.8 C) (Oral)   Resp  20   SpO2 98%   Physical Exam  General: NAD, mildly sarcopenic CV: RRR on monitor Pulm: normal work of breathing on RA Abd: S, NT, rotund MSK: Grossly normal Psych: Appropriate affect. Mildly confused  Imaging:  CTA Chest, 12/10/21 Independently reviewed demonstrating cirrhotic morphology of liver.      US Paracentesis  Result Date: 11/20/2022 INDICATION: History of cirrhosis and recurrent ascites. Request received for diagnostic and therapeutic paracentesis with 5 L max. EXAM: ULTRASOUND GUIDED DIAGNOSTIC AND THERAPEUTIC LEFT LOWER QUADRANT PARACENTESIS MEDICATIONS: 10 mL 1 % lidocaine COMPLICATIONS: None immediate. PROCEDURE: Informed written consent was obtained from the patient after a discussion of the risks, benefits and alternatives to treatment. A timeout was performed prior to the initiation of the procedure. Initial ultrasound scanning demonstrates a moderate amount of ascites within the left lower abdominal quadrant. The left lower abdomen was prepped and draped in the usual sterile fashion. 1% lidocaine was used for local anesthesia. Following this, a 19 gauge, 7-cm, Yueh catheter was introduced. An ultrasound image was saved for documentation purposes. The paracentesis was performed. The catheter was removed and a dressing was applied. The patient tolerated the procedure well without immediate post procedural complication. Patient received post-procedure intravenous albumin; see nursing notes for details. FINDINGS: A total of approximately 5 L of cloudy, yellow fluid was removed. Samples were sent to the laboratory as requested by the clinical team. IMPRESSION: Successful ultrasound-guided paracentesis yielding 5 liters of peritoneal fluid. Procedure was performed by Alex Gardener, NP PLAN: The patient has required  >/=2 paracenteses in a 30 day period and a formal evaluation by the [facility] Interventional Radiology Portal Hypert[facility] has been arranged. Roanna Banning, MD [facility] Radiology Electronically Signed   By: Roanna Banning [name]   On: 11/20/2022 10:52    Labs:  CBC: Recent Labs    12/13/21 0848 11/18/22 1001  WBC 9.4 7.5  HGB 8.8* 10.8*  HCT 28.6* 34.4*  PLT 279 353    COAGS: Recent Labs    11/18/22 1001  INR 1.3*    BMP: Recent Labs    12/14/21 0945 12/15/21 0434 12/16/21 0433 03/06/22 0904 03/21/22 1253 06/17/22 0930 07/18/22 1023 08/20/22 1126 11/18/22 1001  NA 140 139 139   < > 142 140 140 147* 140  K 4.2 4.8 4.2   < > 4.7 3.6 3.9 3.8 4.0  CL 101 100 100   < > 106 95* 98 101 99  CO2 29 29 30    < > 25 30* 30* 27 35*  GLUCOSE 188* 134* 134*   < > 101* 92 93 80 103*  BUN 45* 54* 63*   < > 24* 28* 25 23 26*  CALCIUM 8.8* 8.5* 8.3*   < > 8.6* 8.5* 8.7 9.1 8.6  CREATININE 2.01* 2.05* 2.16*   < > 1.96* 2.45* 1.95* 1.93* 2.17*  GFRNONAA 33* 32* 30*  --  34*  --   --   --   --    < > = values in this interval not displayed.    LIVER FUNCTION TESTS: Recent Labs    07/18/22 1023 11/18/22 1001  BILITOT 0.4 0.4  AST 20 13  ALT 20 11  ALKPHOS 106  --   PROT 5.5* 5.6*  ALBUMIN 3.2*  --    Computed MELD 3.0 unavailable. One or more values for this score either were not found within the given timeframe or did not fit some other criterion. MELD-Na: 17 at  11/18/2022 10:01 AM Calculated from: Serum Creatinine: 2.17 mg/dL at 1/61/0960 45:40 AM Serum Sodium: 140 mmol/L (Using max of 137 mmol/L) at 11/18/2022 10:01 AM Total Bilirubin: 0.4 mg/dL (Using min of 1 mg/dL) at 9/81/1914 78:29 AM INR(ratio): 1.3 at 11/18/2022 10:01 AM   TUMOR MARKERS: Recent Labs    11/18/22 1001  AFPTM 2.8    Assessment and Plan:  82 y/o M comorbid w PMHx significant for CAD s/p CABG, CHF w EF ~45% and Afib s/p ablation x2. Remote  EtOH Hx, quit 20 yrs prior, with recent decompensation with refractory ascites requring serial paracenteses.  MELD 17 (11/18/22). Child-Pugh class B, 9 pts - moderate hepatic dysfunction. [place] Index of Post-TIPS Survival (FIPS) = 0.41 1 month / 93.3%, 3 Months / 78.9%, 6 Months / 69.8%. Favorable. TTE last 12/11/21 w normal RV function Hepatic encephalopathy, subjective grading as described, Grade 1 (intermittent confusion)   Current pharmacologic encephalopathy prophylaxis/treatment: lactulose BID  Current diuretic regimen: furosemide 80 mg BID and spirinolactone 25 mg qD   Risks and benefits of transjugular intrahepatic portosystemic shunt (TIPS) were discussed with the patient and/or the patient's family including, but not limited to, infection, bleeding, damage to adjacent structures, worsening hepatic and/or cardiac function, worsening and/or the development of altered mental status/encephalopathy and death.   The procedure has been fully reviewed with the patient/patient's authorized representative. They have consented to the procedure.   *Remote CT reviewed. Additional imaging, with CT BRTO protocol required. This can be performed in [place]. *TTE repeat (also in [place]) and Cardiac clearance from Pt's Cardiologist *Pre Anesthesia evaluation request *will proceed to schedule TIPS + concurrent Paracentesis once all requests are completed. *request new MELD labs (CBC, CMP, Coags) and Ammonia on procedure day *Admit to [facility] secondary to comorbidities on TIPS admission.   Thank you for this interesting consult.  I greatly enjoyed meeting ZAYLYNN KAMERMAN and look forward to participating in their care.  A copy of this report was sent to the requesting provider on this date.   Electronically Signed:  Roanna Banning, MD [facility] Radiology   Pager. [phone] Clinic. [phone]  I spent a total of 60 Minutes in face to face  in clinical consultation, greater than 50% of which was counseling/coordinating care for Mr Adisa Oriley Schriver's cirrhosis and portal HTN

## 2022-12-03 NOTE — Telephone Encounter (Signed)
Called patient but he did not answer. Left message for him to call back. I was able to see his information in [facility] for his cpap machine.

## 2022-12-04 DIAGNOSIS — G4733 Obstructive sleep apnea (adult) (pediatric): Secondary | ICD-10-CM | POA: Diagnosis not present

## 2022-12-06 DIAGNOSIS — G4733 Obstructive sleep apnea (adult) (pediatric): Secondary | ICD-10-CM | POA: Diagnosis not present

## 2022-12-08 ENCOUNTER — Ambulatory Visit (INDEPENDENT_AMBULATORY_CARE_PROVIDER_SITE_OTHER): Payer: Medicare HMO | Admitting: Gastroenterology

## 2022-12-09 DIAGNOSIS — I5022 Chronic systolic (congestive) heart failure: Secondary | ICD-10-CM | POA: Diagnosis not present

## 2022-12-09 DIAGNOSIS — I2510 Atherosclerotic heart disease of native coronary artery without angina pectoris: Secondary | ICD-10-CM | POA: Diagnosis not present

## 2022-12-09 DIAGNOSIS — Z79899 Other long term (current) drug therapy: Secondary | ICD-10-CM | POA: Diagnosis not present

## 2022-12-09 DIAGNOSIS — I700 Atherosclerosis of aorta: Secondary | ICD-10-CM | POA: Diagnosis not present

## 2022-12-09 DIAGNOSIS — N1832 Chronic kidney disease, stage 3b: Secondary | ICD-10-CM | POA: Diagnosis not present

## 2022-12-15 ENCOUNTER — Other Ambulatory Visit: Payer: Self-pay | Admitting: Interventional Radiology

## 2022-12-15 DIAGNOSIS — K766 Portal hypertension: Secondary | ICD-10-CM

## 2022-12-16 DIAGNOSIS — K7031 Alcoholic cirrhosis of liver with ascites: Secondary | ICD-10-CM | POA: Diagnosis not present

## 2022-12-16 DIAGNOSIS — N183 Chronic kidney disease, stage 3 unspecified: Secondary | ICD-10-CM | POA: Diagnosis not present

## 2022-12-16 DIAGNOSIS — I5022 Chronic systolic (congestive) heart failure: Secondary | ICD-10-CM | POA: Diagnosis not present

## 2022-12-17 ENCOUNTER — Encounter: Payer: Self-pay | Admitting: Primary Care

## 2022-12-17 ENCOUNTER — Ambulatory Visit: Payer: Medicare HMO | Admitting: Primary Care

## 2022-12-17 ENCOUNTER — Other Ambulatory Visit: Payer: Self-pay | Admitting: Interventional Radiology

## 2022-12-17 ENCOUNTER — Telehealth: Payer: Self-pay | Admitting: Cardiology

## 2022-12-17 VITALS — BP 118/60 | HR 61 | Ht 69.0 in | Wt 187.6 lb

## 2022-12-17 DIAGNOSIS — G4733 Obstructive sleep apnea (adult) (pediatric): Secondary | ICD-10-CM | POA: Diagnosis not present

## 2022-12-17 DIAGNOSIS — J9611 Chronic respiratory failure with hypoxia: Secondary | ICD-10-CM | POA: Diagnosis not present

## 2022-12-17 DIAGNOSIS — K766 Portal hypertension: Secondary | ICD-10-CM

## 2022-12-17 DIAGNOSIS — Z01811 Encounter for preprocedural respiratory examination: Secondary | ICD-10-CM | POA: Diagnosis not present

## 2022-12-17 DIAGNOSIS — I4891 Unspecified atrial fibrillation: Secondary | ICD-10-CM | POA: Diagnosis not present

## 2022-12-17 DIAGNOSIS — J9621 Acute and chronic respiratory failure with hypoxia: Secondary | ICD-10-CM | POA: Diagnosis not present

## 2022-12-17 NOTE — Progress Notes (Signed)
@Patient  ID: Caleb Wolf, male    DOB: 10-06-1940, 82 y.o.   MRN: [id number]  Chief Complaint  Patient presents with   Follow-up    OSA on CPAP    Referring provider: Marguerita Merles, Valentino Saxon*  HPI: 82 year old male.  Past medical history significant for heart failure, A-fib, hypertension, coronary artery disease, OSA, acute respiratory failure with hypoxia, GERD.  Patient of Dr. Maple Hudson, follow-up for OSA.  Previous [facility] pulmonary encounter: 12/31/2021 Patient presents today for hospital follow-up.  Patient was admitted to [facility] on 7/11 with dyspnea and chest pain.  Started 2 weeks prior to admission with leg swelling and orthopnea.  He was found to have new systolic heart failure, cardiology consulted.  He is on Xarelto for A-fib.  He was noted to have persistent O2 needs and developed hemoptysis.  He recently traveled to [place] in June and was fine prior to this.  Symptoms are not typical for bacterial pneumonia.  Procalcitonin was negative, antibiotics were discontinued.  Concern for inflammatory process which could be postviral Boop or other environmental exposures when travel to [place].  He also has a history of A-fib and is on amiodarone and Xarelto.  Could have pneumonitis from amiodarone but less likely.  Airway irritation from inflammatory process causing mild hemoptysis.  He is doing very well today. Breathing has improved. No further hemoptysis. His oxygen levels have been in mid 90s off O2. He wears 2L oxygen at night with CPAP. Currently using old resmed machine. He received a new travel CPAP machine. He completed prednisone course. He remains lasix 40mg  daily. He is following reduced sodium diet. He will be seeing cardiology tomorrow.   Respiratory viral panel was negative Sed rate 75, elevated ANA, rheumatoid factor and ANCA were negative BNP 685 on 7/14  Airview download 11/29/21-12/28/21 Usage 15/30 days; 50% > 4 hours (he was admitted in July) Average usage days  used 11 hour 40 mins Pressure 5-20cm h20 (13.3cm h20-95%) Apnea index- 6.7 central; 1.2 obstructive AHI 12.0   09/22/22- Dr. Maple Hudson 82 year old male former smoker followed for OSA, Pleural Effusion, complicated by HTN, CAD/MI/CABG/AFib/ Xarelto, carotid stenosis, osteoarthritis hip, GERD CPAP auto 5-20/[employer] O2 2L sleep/ [employer] Download-compliance 80%, AHI 3.1/ hr Body weight today-204 lbs Wife is here.  He reports sleeping okay with supplemental oxygen plus his CPAP but they note CPAP machine is 80 years old so we will try to get it replaced.  Download reviewed. He has been developing abdominal ascites for sound with result pending.  He is not aware of liver disease but has chronic renal insufficiency.  Cardiology put him on a diuretic.  He remains in chronic atrial fibrillation. We will update CXR to check on status of previous right pleural effusion but that is likely related to the same mechanism as his abdominal process.      12/17/2022- Interim hx  Patient presents today fu OSA, pleural effusion, afib on xarelto. Patient of Dr. Maple Hudson.  He is maintained on Auto CPAP 5-20cm h20 with 2L oxygen. DME company is [employer] Since last visit he has received new CPAP machine, resmed airsens 11. Using full face mask. Getting 8-9 hours of sleep. He wakes up every few hours to use the restroom duer to diuretics  Following with cardiology for afib. Patient underwent paracentesis in July, seeing Dr. Levon Hedger next week. He is taking 80mg  Lasix twice daily along with spirolactone for abdominal ascites Looking to qualify for TIPS procedure with Dr. Roanna Banning, no date for  procedure has been set.   Breathing wise he is doing alright. No significant shrotness of breath. Some mild dyspnea with exertion, managemeable. He ambulates with cane. No recent respiratory infection. No signfiicant ciugh or mucus production.   Airview download 11/16/22-12/15/22 30/30 days (100%); 29 days (97%) > 4 hours Average usage 8  hours 57 Pressure 5-20cm h20 (18.9cm h20-95%) Airleaks 8.6L/min (95%) AHI 1.0   Allergies  Allergen Reactions   Codeine Sulfate Nausea Only    Immunization History  Administered Date(s) Administered   Fluad Quad(high Dose 65+) 03/13/2021   Influenza Split 03/02/2012, 03/16/2014, 03/14/2015   Influenza, High Dose Seasonal PF 03/28/2019   Influenza,inj,Quad PF,6+ Mos 03/08/2013   Influenza,inj,quad, With Preservative 03/02/2017   Influenza-Unspecified 03/24/2022   Moderna Sars-Covid-2 Vaccination 06/14/2019, 07/15/2019, 02/12/2020   Pneumococcal Conjugate-13 06/02/2002, 02/01/2008   Td 06/02/2008   Zoster, Live 06/03/2007    Past Medical History:  Diagnosis Date   AAA (abdominal aortic aneurysm) (HCC)    Arthritis    Basal cell carcinoma    Carotid stenosis    Chronic kidney disease, stage 3b (HCC) 12/13/2021   Coronary artery disease    Multvessel s/p CABG 2015   Enlarged prostate    Essential hypertension    GERD (gastroesophageal reflux disease)    Gout    History of colon polyps    History of kidney stones    Hyperlipidemia    Lumbar disc disease    Persistent atrial fibrillation (HCC)    Sleep apnea    CPAP    Tobacco History: Social History   Tobacco Use  Smoking Status Former   Current packs/day: 0.00   Average packs/day: 1.5 packs/day for 25.0 years (37.5 ttl pk-yrs)   Types: Cigarettes   Start date: 08/08/1960   Quit date: 06/02/1982   Years since quitting: 40.5   Passive exposure: Never  Smokeless Tobacco Never  Tobacco Comments   quit smoking 30+yrs ago   Counseling given: Not Answered Tobacco comments: quit smoking 30+yrs ago   Outpatient Medications Prior to Visit  Medication Sig Dispense Refill   acetaminophen (TYLENOL) 500 MG tablet Take 1,000 mg by mouth every 6 (six) hours as needed for moderate pain.     amiodarone (PACERONE) 200 MG tablet Take 1 tablet (200 mg total) by mouth daily. 90 tablet 1   amoxicillin (AMOXIL) 500 MG capsule  Take 2,000 mg by mouth See admin instructions. Take 4 capsules (2000 mg) by mouth before dental procedures     atorvastatin (LIPITOR) 80 MG tablet TAKE 1 TABLET BY MOUTH EVERY DAY IN THE EVENING 90 tablet 3   cholecalciferol (VITAMIN D3) 25 MCG (1000 UNIT) tablet Take 1,000 Units by mouth daily.     colchicine 0.6 MG tablet Take 0.6 mg by mouth daily as needed (for gout flare up).     cyanocobalamin (VITAMIN B12) 1000 MCG tablet Take 1,000 mcg by mouth daily.     doxazosin (CARDURA) 4 MG tablet Take 4 mg by mouth in the morning.  4   empagliflozin (JARDIANCE) 10 MG TABS tablet Take 1 tablet (10 mg total) by mouth daily before breakfast. 30 tablet 11   finasteride (PROSCAR) 5 MG tablet Take 5 mg by mouth every evening.      fluticasone (FLONASE) 50 MCG/ACT nasal spray Place 1 spray into both nostrils daily as needed for allergies or rhinitis.     furosemide (LASIX) 80 MG tablet Take 1 tablet (80 mg total) by mouth 2 (two) times daily. 180 tablet  3   lactulose (CHRONULAC) 10 GM/15ML solution Take 30 mLs (20 g total) by mouth daily. Can increase to twice a day depending on constipation improvement 1892 mL 2   lisinopril (ZESTRIL) 40 MG tablet Take 40 mg by mouth daily.     nitroGLYCERIN (NITROSTAT) 0.4 MG SL tablet Place 1 tablet (0.4 mg total) under the tongue every 5 (five) minutes x 3 doses as needed for chest pain. 75 tablet 3   pantoprazole (PROTONIX) 40 MG tablet Take 1 tablet (40 mg total) by mouth daily before breakfast. 90 tablet 3   Rivaroxaban (XARELTO) 15 MG TABS tablet Take 1 tablet (15 mg total) by mouth daily with supper. 30 tablet 1   spironolactone (ALDACTONE) 25 MG tablet Take 1 tablet (25 mg total) by mouth daily. 90 tablet 0   No facility-administered medications prior to visit.   Review of Systems  Review of Systems  Constitutional: Negative.   Respiratory: Negative.    Cardiovascular: Negative.    Physical Exam  BP 118/60 (BP Location: Left Arm, Patient Position:  Sitting, Cuff Size: Normal)   Pulse 61   Ht 5\' 9"  (1.753 m)   Wt 187 lb 9.6 oz (85.1 kg)   SpO2 94%   BMI 27.70 kg/m  Physical Exam Constitutional:      Appearance: Normal appearance.  HENT:     Head: Normocephalic and atraumatic.  Cardiovascular:     Rate and Rhythm: Normal rate and regular rhythm.     Comments: LE swelling  Pulmonary:     Effort: Pulmonary effort is normal.     Breath sounds: Normal breath sounds.     Comments: CTA Abdominal:     General: There is distension.  Musculoskeletal:        General: Normal range of motion.  Skin:    General: Skin is warm and dry.  Neurological:     General: No focal deficit present.     Mental Status: He is alert and oriented to person, place, and time. Mental status is at baseline.  Psychiatric:        Mood and Affect: Mood normal.        Behavior: Behavior normal.        Thought Content: Thought content normal.        Judgment: Judgment normal.      Lab Results:  CBC    Component Value Date/Time   WBC 7.5 11/18/2022 1001   RBC 3.95 (L) 11/18/2022 1001   HGB 10.8 (L) 11/18/2022 1001   HGB 12.3 (L) 11/16/2020 1445   HCT 34.4 (L) 11/18/2022 1001   HCT 38.4 11/16/2020 1445   PLT 353 11/18/2022 1001   PLT 198 11/16/2020 1445   MCV 87.1 11/18/2022 1001   MCV 97 11/16/2020 1445   MCH 27.3 11/18/2022 1001   MCHC 31.4 (L) 11/18/2022 1001   RDW 13.9 11/18/2022 1001   RDW 12.8 11/16/2020 1445   LYMPHSABS 938 11/18/2022 1001   LYMPHSABS 1.4 11/16/2020 1445   MONOABS 0.8 12/10/2021 1700   EOSABS 150 11/18/2022 1001   EOSABS 0.2 11/16/2020 1445   BASOSABS 38 11/18/2022 1001   BASOSABS 0.0 11/16/2020 1445    BMET    Component Value Date/Time   NA 140 11/18/2022 1001   NA 147 (H) 08/20/2022 1126   K 4.0 11/18/2022 1001   CL 99 11/18/2022 1001   CO2 35 (H) 11/18/2022 1001   GLUCOSE 103 (H) 11/18/2022 1001   BUN 26 (H) 11/18/2022 1001  BUN 23 08/20/2022 1126   CREATININE 2.17 (H) 11/18/2022 1001   CALCIUM 8.6  11/18/2022 1001   GFRNONAA 34 (L) 03/21/2022 1253   GFRAA 50 (L) 08/15/2019 0713    BNP    Component Value Date/Time   BNP 847.0 (H) 03/21/2022 1253    ProBNP    Component Value Date/Time   PROBNP 829.0 (H) 12/31/2021 1640    Imaging: IR Radiologist Eval & Mgmt  Result Date: 12/01/2022 EXAM: NEW PATIENT OFFICE VISIT CHIEF COMPLAINT: See below HISTORY OF PRESENT ILLNESS: See below REVIEW OF SYSTEMS: See below PHYSICAL EXAMINATION: See below ASSESSMENT AND PLAN: Please refer to completed note in the electronic medical record on [facility] Roanna Banning, MD [facility] Radiology Electronically Signed   By: Roanna Banning [name]   On: 12/01/2022 10:05   US Paracentesis  Result Date: 11/25/2022 INDICATION: Cirrhosis and recurrent ascites. EXAM: ULTRASOUND GUIDED DIAGNOSTIC AND THERAPEUTIC PARACENTESIS MEDICATIONS: None. COMPLICATIONS: None immediate. PROCEDURE: Informed written consent was obtained from the patient after a discussion of the risks, benefits and alternatives to treatment. A timeout was performed prior to the initiation of the procedure. Initial ultrasound scanning demonstrates a moderate amount of ascites within the right lower abdominal quadrant. The right lower abdomen was prepped and draped in the usual sterile fashion. 1% lidocaine was used for local anesthesia. Following this, a 19 gauge, 7-cm, Yueh catheter was introduced. An ultrasound image was saved for documentation purposes. The paracentesis was performed. The catheter was removed and a dressing was applied. The patient tolerated the procedure well without immediate post procedural complication. Patient received post-procedure intravenous albumin; see nursing notes for details. FINDINGS: A total of approximately 2.4 L of cloudy, yellow fluid was removed. Samples were sent to the laboratory as requested by the clinical team. IMPRESSION: Successful ultrasound-guided  paracentesis yielding 2.4 liters of peritoneal fluid. Electronically Signed   By: Obie Dredge [name]   On: 11/25/2022 11:02   US Paracentesis  Result Date: 11/20/2022 INDICATION: History of cirrhosis and recurrent ascites. Request received for diagnostic and therapeutic paracentesis with 5 L max. EXAM: ULTRASOUND GUIDED DIAGNOSTIC AND THERAPEUTIC LEFT LOWER QUADRANT PARACENTESIS MEDICATIONS: 10 mL 1 % lidocaine COMPLICATIONS: None immediate. PROCEDURE: Informed written consent was obtained from the patient after a discussion of the risks, benefits and alternatives to treatment. A timeout was performed prior to the initiation of the procedure. Initial ultrasound scanning demonstrates a moderate amount of ascites within the left lower abdominal quadrant. The left lower abdomen was prepped and draped in the usual sterile fashion. 1% lidocaine was used for local anesthesia. Following this, a 19 gauge, 7-cm, Yueh catheter was introduced. An ultrasound image was saved for documentation purposes. The paracentesis was performed. The catheter was removed and a dressing was applied. The patient tolerated the procedure well without immediate post procedural complication. Patient received post-procedure intravenous albumin; see nursing notes for details. FINDINGS: A total of approximately 5 L of cloudy, yellow fluid was removed. Samples were sent to the laboratory as requested by the clinical team. IMPRESSION: Successful ultrasound-guided paracentesis yielding 5 liters of peritoneal fluid. Procedure was performed by Alex Gardener, NP PLAN: The patient has required >/=2 paracenteses in a 30 day period and a formal evaluation by the [facility] has been arranged. Roanna Banning, MD [facility] Radiology Electronically Signed   By: Roanna Banning [name]   On: 11/20/2022 10:52     Assessment & Plan:  OSA (obstructive sleep  apnea) - Well controlled on auto CPAP. Patient is 97% compliant with CPAP use > 4 hours last 30 days. Current pressure 5-20cm h20; Residual AHI 1.0/hour. No changes today. Continue to encourage patient wear CPAP nightly 4-6 hours. Work on weight loss efforts. FU in 1 year or sooner if needed.   Chronic respiratory failure with hypoxia (HCC) - Stable; No daytime requirements - Continue 2L oxygen with CPAP at night   Preop respiratory exam - Patient has hx sleep apnea and chronic respiratory failure.  Respiratory exam today in office was normal.  He is compliant with CPAP use and wears 2 L of oxygen at night.  No daytime oxygen requirements.  Recent respiratory infections. He is immediate risk for prolonged mechanical ventilation and/or postop pulmonary complications.  From pulmonary standpoint he is optimized for TIPSS procedure.  Ultimate clearance will be decided upon by surgeon/anesthesiology.    1) RISK FOR PROLONGED MECHANICAL VENTILAION - > 48h  1A) Arozullah - Prolonged mech ventilation risk Arozullah Postperative Pulmonary Risk Score - for mech ventilation dependence >48h USAA, Ann Surg 2000, major non-cardiac surgery) Comment Score  Type of surgery - abd ao aneurysm (27), thoracic (21), neurosurgery / upper abdominal / vascular (21), neck (11) TIPS procedure- abdominal  10  Emergency Surgery - (11)  0  ALbumin < 3 or poor nutritional state - (9)  0  BUN > 30 -  (8)  0  Partial or completely dependent functional status - (7)  0  COPD -  (6)  0  Age - 60 to 69 (4), > 70  (6)  6  TOTAL  16  Risk Stratifcation scores  - < 10 (0.5%), 11-19 (1.8%), 20-27 (4.2%), 28-40 (10.1%), >40 (26.6%)  1.8 %      1B) GUPTA - Prolonged Mech Vent Risk Score source Risk  Guptal post op prolonged mech ventilation > 48h or reintubation < 30 days - ACS 2007-2008 dataset - [email or web address] 0.8 % Risk of mechanical ventilation for >48 hrs  after surgery, or unplanned intubation ?30 days of surgery    2) RISK FOR POST OP PNEUMONIA Score source Risk  Chales Abrahams - Post Op Pnemounia risk  [email or web address] 1.0 % Risk of postoperative pneumonia     R3) ISK FOR ANY POST-OP PULMONARY COMPLICATION Score source Risk  CANET/ARISCAT Score - risk for ANY/ALl pulmonary complications - > risk of in-hospital post-op pulmonary complications (composite including respiratory failure, respiratory infection, pleural effusion, atelectasis, pneumothorax, bronchospasm, aspiration pneumonitis) [employer]es - based on age, anemia, pulse ox, resp infection prior 30d, incision site, duration of surgery, and emergency v elective surgery Intermediate risk 13.3% risk of in-hospital post-op pulmonary complications (composite including respiratory failure, respiratory infection, pleural effusion, atelectasis, pneumothorax, bronchospasm, aspiration pneumonitis)     Glenford Bayley, NP 12/17/2022

## 2022-12-17 NOTE — Patient Instructions (Addendum)
  Recommendations: Continue Diuretics as directed  Continue to wear CPAP nightly with 2L oxygen   Follow-up: 6 months with Dr. Maple Hudson

## 2022-12-17 NOTE — Assessment & Plan Note (Addendum)
-   Stable; No daytime requirements - Continue 2L oxygen with CPAP at night

## 2022-12-17 NOTE — Assessment & Plan Note (Addendum)
-   Patient has hx sleep apnea and chronic respiratory failure.  Respiratory exam today in office was normal.  He is compliant with CPAP use and wears 2 L of oxygen at night.  No daytime oxygen requirements.  Recent respiratory infections. He is immediate risk for prolonged mechanical ventilation and/or postop pulmonary complications.  From pulmonary standpoint he is optimized for TIPSS procedure.  Ultimate clearance will be decided upon by surgeon/anesthesiology.

## 2022-12-17 NOTE — Assessment & Plan Note (Signed)
-   Well controlled on auto CPAP. Patient is 97% compliant with CPAP use > 4 hours last 30 days. Current pressure 5-20cm h20; Residual AHI 1.0/hour. No changes today. Continue to encourage patient wear CPAP nightly 4-6 hours. Work on weight loss efforts. FU in 1 year or sooner if needed.

## 2022-12-17 NOTE — Telephone Encounter (Signed)
   Pre-operative Risk Assessment    Patient Name: Caleb Wolf  DOB: 06-16-1940 MRN: [id number]      Request for Surgical Clearance    Procedure:   TIPS procedure  Date of Surgery:  Clearance TBD                                 Surgeon:  Dr. Demetrios Isaacs Surgeon's Group or Practice Name:  [facility] Phone number:  [phone] Fax number:  [phone]   Type of Clearance Requested:   - Medical    Type of Anesthesia:  General    Additional requests/questions:      SignedFilomena Jungling   12/17/2022, 4:51 PM

## 2022-12-22 ENCOUNTER — Telehealth: Payer: Self-pay | Admitting: *Deleted

## 2022-12-22 ENCOUNTER — Encounter (INDEPENDENT_AMBULATORY_CARE_PROVIDER_SITE_OTHER): Payer: Self-pay | Admitting: Gastroenterology

## 2022-12-22 ENCOUNTER — Ambulatory Visit (INDEPENDENT_AMBULATORY_CARE_PROVIDER_SITE_OTHER): Payer: Medicare HMO | Admitting: Gastroenterology

## 2022-12-22 ENCOUNTER — Other Ambulatory Visit (INDEPENDENT_AMBULATORY_CARE_PROVIDER_SITE_OTHER): Payer: Self-pay | Admitting: Gastroenterology

## 2022-12-22 VITALS — BP 147/65 | HR 50 | Temp 98.4°F | Ht 69.0 in | Wt 185.4 lb

## 2022-12-22 DIAGNOSIS — K5904 Chronic idiopathic constipation: Secondary | ICD-10-CM | POA: Diagnosis not present

## 2022-12-22 DIAGNOSIS — R188 Other ascites: Secondary | ICD-10-CM | POA: Diagnosis not present

## 2022-12-22 DIAGNOSIS — K7460 Unspecified cirrhosis of liver: Secondary | ICD-10-CM | POA: Diagnosis not present

## 2022-12-22 MED ORDER — LACTULOSE 10 GM/15ML PO SOLN
20.0000 g | Freq: Two times a day (BID) | ORAL | 2 refills | Status: DC
Start: 2022-12-22 — End: 2023-03-17

## 2022-12-22 NOTE — Telephone Encounter (Signed)
   Name: Caleb Wolf  DOB: [birth year]  MRN: [id number]  Primary Cardiologist: Donato Schultz, MD   Preoperative team, please contact this patient and set up a phone call appointment for further preoperative risk assessment. Please obtain consent and complete medication review. Thank you for your help.  I confirm that guidance regarding antiplatelet and oral anticoagulation therapy has been completed and, if necessary, noted below.  Per office protocol, patient can hold Xarelto  for 2 days prior to procedure.     Joni Reining, NP 12/22/2022, 8:51 AM [facility]

## 2022-12-22 NOTE — Progress Notes (Addendum)
Referring Provider: Marguerita Merles, Valentino Saxon* Primary Care Physician:  Dolores Frame, MD Primary GI Physician: Dr. Levon Hedger   Chief Complaint  Patient presents with   Cirrhosis    Follow up on Cirrhosis. Has gained about 12 lbs since June 25th the day of last paracentesis.    Constipation    Has a stool about twice a week. Takes 30 ml of lactulose daily. He will increase to twice a day if he feels he needs it.    HPI:   Caleb Wolf is a 82 y.o. male with past medical history of AA, carotid stenosis, CKD stage IIIb, coronary artery disease status post CABG, hypertension, GERD, hyperlipidemia, atrial fibrillation, OSA, CHF with ejection fraction of 45%   Patient presenting today for follow up of Cirrhosis and ascites  Last seen in June 2024, at that time he reported long standing abdominal distention though worse over the past year. Noted some LE edema, taking furosemide 80mg  BID.   abdominal ultrasound on 09/22/2022 showed moderate ascites and liver changes suggestive of hepatic cirrhosis without focal hepatic lesion, there was a 3 mm small gallbladder polyp. Reportedly trying to avoid eating food with salt as much as possible.   Recommended to have further serologies as well as MELD labs, CBC, continue lasix 80mg  BID, start spironoalctone 25mg  daily, start lactulose 20g/day, diagnostic/therapeutic para  Labs with creatnine stable at 2.17. MELD 3.0 was 17, other serologies negative  Ascitic fluid From Para on 6/20 without SBP but showed portal hypertension/increased protein suggestive of a large component of the ascites being driven by his heart disease.  (para 6/12, 6/17, 6/20, 6/25)  Advised to continue with lasix 80mg  BID, spironolactone 25mg  daily at that time, he was referred to IR for TIPS evaluation which he had on 7/1, IR agreeable to pursue TIPS after required workup.   Present:  Patient arrives with his wife [name] who helps provide history. He states that he is  beginning to have more swelling in his abdomen. They deny any episodes of confusion. He is having a BM maybe twice per week. He is having a formed, somewhat harder stool requiring him to strain to go. No rectal bleeding or melena. He is still having some mild swelling in his LEs, wife states that he is to wear compression stockings but does not always do this as he is supposed to. He feels appetite is pretty good. They are trying to limit salt intake. He denies any shortness of breath. Reports he has gained some weight since last paracentesis though per chart review, appears he was 210 pounds on 6/10 and is 185lbs today.    Cirrhosis related questions: Hematemesis/coffee ground emesis: No Abdominal pain: No Abdominal distention/worsening ascites: Yes Fever/chills: No Episodes of confusion/disorientation: No Number of daily bowel movements: maybe twice per week  Taking diuretics?: Yes, lasix 80 mg BID, spironolactone 25mg  daily  History of variceal bleeding: No Prior history of banding?: No Prior episodes of SBP: No Last time liver imaging was performed: April 2024 Moderate volume of ascites. Findings suggestive of hepatic cirrhosis. Increased hepatic parenchymal echogenicity with slight nodular hepatic contour. No focal hepatic lesion. Small gallbladder polyp of 3 mm Last AFP: June 18th, 2.8  MELD score 3.0: 17 Currently consuming alcohol: No   Last EGD: 12/30/2017,mid esophagus was dilated,Partial fundal wrap, presence of lipoma in the second portion of the duodenum. Last Colonoscopy: 12/30/2017, small polyps in the cecum which were removed biopsy, diverticulosis and external hemorrhoids. Pathology consistent with  1 tubular adenoma.  Recommended repeating a colonoscopy in 7 years   Past Medical History:  Diagnosis Date   AAA (abdominal aortic aneurysm) (HCC)    Arthritis    Basal cell carcinoma    Carotid stenosis    Chronic kidney disease, stage 3b (HCC) 12/13/2021   Coronary artery  disease    Multvessel s/p CABG 2015   Enlarged prostate    Essential hypertension    GERD (gastroesophageal reflux disease)    Gout    History of colon polyps    History of kidney stones    Hyperlipidemia    Lumbar disc disease    Persistent atrial fibrillation (HCC)    Sleep apnea    CPAP    Past Surgical History:  Procedure Laterality Date   ABLATION OF DYSRHYTHMIC FOCUS  07/28/2017   ATRIAL FIBRILLATION ABLATION N/A 07/28/2017   Procedure: ATRIAL FIBRILLATION ABLATION;  Surgeon: Hillis Range, MD;  Location: [facility] INV[facility]IVE CV LAB;  Service: Cardiovascular;  Laterality: N/A;   ATRIAL FIBRILLATION ABLATION N/A 12/04/2020   Procedure: ATRIAL FIBRILLATION ABLATION;  Surgeon: Hillis Range, MD;  Location: [facility] INVASIVE CV LAB;  Service: Cardiovascular;  Laterality: N/A;   BACK SURGERY     CARDIAC CATHETERIZATION  05/22/2014   Procedure: IABP INSERTION;  Surgeon: Marykay Lex, MD;  Location: [facility] CATH LAB;  Service: Cardiovascular;;   CARDIOVERSION N/A 04/29/2017   Procedure: CARDIOVERSION;  Surgeon: Jonelle Sidle, MD;  Location: [facility] ENDO SUITE;  Service: Cardiovascular;  Laterality: N/A;   CARDIOVERSION N/A 08/13/2017   Procedure: CARDIOVERSION;  Surgeon: Chrystie Nose, MD;  Location: [facility] ENDOSCOPY;  Service: Cardiovascular;  Laterality: N/A;   CARDIOVERSION N/A 08/15/2019   Procedure: CARDIOVERSION;  Surgeon: Lars Masson, MD;  Location: [facility] ENDOSCOPY;  Service: Cardiovascular;  Laterality: N/A;   CARDIOVERSION N/A 03/09/2020   Procedure: CARDIOVERSION;  Surgeon: Jodelle Red, MD;  Location: [facility] ENDOSCOPY;  Service: Cardiovascular;  Laterality: N/A;   CARDIOVERSION N/A 07/06/2020   Procedure: CARDIOVERSION;  Surgeon: Thurmon Fair, MD;  Location: [facility] ENDOSCOPY;  Service: Cardiovascular;  Laterality: N/A;   CARDIOVERSION N/A 01/18/2021   Procedure: CARDIOVERSION;  Surgeon: Chilton Si, MD;  Location: [facility] ENDOSCOPY;  Service: Cardiovascular;  Laterality: N/A;    CARDIOVERSION N/A 06/17/2021   Procedure: CARDIOVERSION;  Surgeon: Sande Rives, MD;  Location: [facility] ENDOSCOPY;  Service: Cardiovascular;  Laterality: N/A;   Cataract surgery Right    COLONOSCOPY     COLONOSCOPY N/A 12/30/2017   Procedure: COLONOSCOPY;  Surgeon: Malissa Hippo, MD;  [facility]: [facility]O [facility];  Service: Endoscopy;  Laterality: N/A;   CORONARY ARTERY BYPASS GRAFT N/A 05/22/2014   Procedure: CORONARY ARTERY BYPASS GRAFTING (CABG) times three using left internal mammary and right saphenous vein.;  Surgeon: Loreli Slot, MD;  Location: [facility] OR;  Service: Open Heart Surgery;  Laterality: N/A;   ENDARTERECTOMY Left 02/17/2014   Procedure: ENDARTERECTOMY CAROTID WITH PATCH ANGIOPLASTY;  Surgeon: Pryor Ochoa, MD;  Location: [facility] OR;  Service: Vascular;  Laterality: Left;   ESOPHAGEAL DILATION N/A 08/22/2015   Procedure: ESOPHAGEAL DILATION;  Surgeon: Malissa Hippo, MD;  [facility];  Service: Endoscopy;  Laterality: N/A;   ESOPHAGOGASTRODUODENOSCOPY N/A 08/22/2015   Procedure: ESOPHAGOGASTRODUODENOSCOPY (EGD);  Surgeon: Malissa Hippo, MD;  [facility]: [facility];  Service: Endoscopy;  Laterality: N/A;  12:45 - moved to 1:55 - Ann notified pt   ESOPHAGOGASTRODUODENOSCOPY N/A 12/30/2017   Procedure: ESOPHAGOGASTRODUODENOSCOPY (EGD);  Surgeon: Malissa Hippo, MD;  Location:  [facility] END[facility];  Service: Endoscopy;  Laterality: N/A;  200   EYE SURGERY     cataract extraction (right) with repair macular tear , with IOL     right   FRACTURE SURGERY     bilateral wrist fractures- one ORIF   IR RADIOLOGIST EVAL & MGMT  12/01/2022   JOINT REPLACEMENT  2011   left knee   LEFT HEART CATHETERIZATION WITH CORONARY ANGIOGRAM N/A 05/22/2014   Procedure: LEFT HEART CATHETERIZATION WITH CORONARY ANGIOGRAM;  Surgeon: Marykay Lex, MD;  Location: [facility] CATH LAB;  Service: Cardiovascular;  Laterality: N/A;   LITHOTRIPSY     PARS PLANA VITRECTOMY W/ REPAIR OF MACULAR HOLE      RHINOPLASTY     TEE WITHOUT CARDIOVERSION N/A 04/29/2017   Procedure: TRANSESOPHAGEAL ECHOCARDIOGRAM (TEE) WITH PROPOFOL;  Surgeon: Jonelle Sidle, MD;  Location: [facility] ENDO [facility];  Service: Cardiovascular;  Laterality: N/A;   TONSILLECTOMY     TOTAL HIP ARTHROPLASTY  12/08/2011   Procedure: TOTAL HIP ARTHROPLASTY;  Surgeon: Loanne Drilling, MD;  Location: [facility] ORS;  Service: Orthopedics;  Laterality: Right;   UPPER GI ENDOSCOPY  12/18/2015   Procedure: UPPER GI ENDOSCOPY;  Surgeon: Axel Filler, MD;  Location: [facility] ORS;  Service: General;;   WRIST SURGERY Right 38yrs ago   WRIST SURGERY Left     Current Outpatient Medications  Medication Sig Dispense Refill   acetaminophen (TYLENOL) 500 MG tablet Take 1,000 mg by mouth every 6 (six) hours as needed for moderate pain.     amiodarone (PACERONE) 200 MG tablet Take 1 tablet (200 mg total) by mouth daily. 90 tablet 1   amoxicillin (AMOXIL) 500 MG capsule Take 2,000 mg by mouth See admin instructions. Take 4 capsules (2000 mg) by mouth before dental procedures     atorvastatin (LIPITOR) 80 MG tablet TAKE 1 TABLET BY MOUTH EVERY DAY IN THE EVENING 90 tablet 3   cholecalciferol (VITAMIN D3) 25 MCG (1000 UNIT) tablet Take 1,000 Units by mouth daily.     colchicine 0.6 MG tablet Take 0.6 mg by mouth daily as needed (for gout flare up).     cyanocobalamin (VITAMIN B12) 1000 MCG tablet Take 1,000 mcg by mouth as directed. 2-3 TIMES WEEKLY PER PT'S WIFE     doxazosin (CARDURA) 4 MG tablet Take 4 mg by mouth in the morning.  4   empagliflozin (JARDIANCE) 10 MG TABS tablet Take 1 tablet (10 mg total) by mouth daily before breakfast. 30 tablet 11   feeding supplement (BOOST HIGH PROTEIN) LIQD Take 1 Container by mouth 3 (three) times daily between meals. One shake before bedtime.     finasteride (PROSCAR) 5 MG tablet Take 5 mg by mouth every evening.      fluticasone (FLONASE) 50 MCG/ACT nasal spray Place 1 spray into both nostrils daily as needed for  allergies or rhinitis.     furosemide (LASIX) 80 MG tablet Take 1 tablet (80 mg total) by mouth 2 (two) times daily. 180 tablet 3   lactulose (CHRONULAC) 10 GM/15ML solution Take 30 mLs (20 g total) by mouth daily. Can increase to twice a day depending on constipation improvement 1892 mL 2   nitroGLYCERIN (NITROSTAT) 0.4 MG SL tablet Place 1 tablet (0.4 mg total) under the tongue every 5 (five) minutes x 3 doses as needed for chest pain. 75 tablet 3   pantoprazole (PROTONIX) 40 MG tablet Take 1 tablet (40 mg total) by mouth daily before breakfast. 90 tablet 3  Rivaroxaban (XARELTO) 15 MG TABS tablet Take 1 tablet (15 mg total) by mouth daily with supper. 30 tablet 1   spironolactone (ALDACTONE) 25 MG tablet Take 1 tablet (25 mg total) by mouth daily. 90 tablet 0   lisinopril (ZESTRIL) 40 MG tablet Take 40 mg by mouth daily. (Patient not taking: Reported on 12/22/2022)     No current facility-administered medications for this visit.    Allergies as of 12/22/2022 - Review Complete 12/22/2022  Allergen Reaction Noted   Codeine sulfate Nausea Only 12/01/2011    Family History  Problem Relation Age of Onset   Allergies Mother    Heart disease Mother    Hypertension Mother    Heart disease Father        MI    Social History   Socioeconomic History   Marital status: Married    Spouse name: Not on file   Number of children: Not on file   Years of education: Not on file   Highest education level: Not on file  Occupational History   Occupation: retired    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  Tobacco Use   Smoking status: Former    Current packs/day: 0.00    Average packs/day: 1.5 packs/day for 25.0 years (37.5 ttl pk-yrs)    Types: Cigarettes    Start date: 08/08/1960    Quit date: 06/02/1982    Years since quitting: 40.5    Passive exposure: Never   Smokeless tobacco: Never   Tobacco comments:    quit smoking 30+yrs ago  Vaping Use   Vaping status: Never Used  Substance and Sexual  Activity   Alcohol use: No    Alcohol/week: 0.0 standard drinks of alcohol    Comment: occasionally    Drug use: No   Sexual activity: Yes  Other Topics Concern   Not on file  Social History Narrative   Not on file   Social Determinants of Health   Financial Resource Strain: Not on file  Food Insecurity: Not on file  Transportation Needs: Not on file  Physical Activity: Not on file  Stress: Not on file  Social Connections: Not on file    Review of systems General: negative for malaise, night sweats, fever, chills, weight loss Neck: Negative for lumps, goiter, pain and significant neck swelling Resp: Negative for cough, wheezing, dyspnea at rest CV: Negative for chest pain, leg swelling, palpitations, orthopnea GI: denies melena, hematochezia, nausea, vomiting, diarrhea, constipation, dysphagia, odyonophagia, early satiety or unintentional weight loss. +ascites  MSK: Negative for joint pain or swelling, back pain, and muscle pain. Derm: Negative for itching or rash Psych: Denies depression, anxiety, memory loss, confusion. No homicidal or suicidal ideation.  Heme: Negative for prolonged bleeding, bruising easily, and swollen nodes. Endocrine: Negative for cold or heat intolerance, polyuria, polydipsia and goiter. Neuro: negative for tremor, gait imbalance, syncope and seizures. The remainder of the review of systems is noncontributory.  Physical Exam: BP (!) 147/65   Pulse (!) 50   Temp 98.4 F (36.9 C) (Oral)   Ht 5\' 9"  (1.753 m)   Wt 185 lb 6.4 oz (84.1 kg)   BMI 27.38 kg/m  General:   Alert and oriented. No distress noted. Pleasant and cooperative.  Head:  Normocephalic and atraumatic. Eyes:  Conjuctiva clear without scleral icterus. Mouth:  Oral mucosa pink and moist. Good dentition. No lesions. Heart: Normal rate and rhythm, s1 and s2 heart sounds present.  Lungs: Clear lung sounds in all lobes. Respirations equal and  unlabored. Abdomen:  +BS, soft, non-tender,  abdomen is distended but non taut.  No rebound or guarding. No HSM or masses noted. Derm: No palmar erythema, No jaundice Msk:  Symmetrical without gross deformities. Normal posture. Extremities:  mild edema to bilateral LEs  Neurologic:  Alert and  oriented x4, no asterixis  Psych:  Alert and cooperative. Normal mood and affect.  Invalid input(s): "6 MONTHS"   ASSESSMENT: VENTURA HOLLENBECK is a 82 y.o. male presenting today for follow up of cirrhosis.  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 He notes fluid seems to have slowly been re-accumulating in his belly since last paracentesis. Notably with a good bit of distention on exam today, though abdomen is not tense. He feels appetite is good, trying to follow low sodium diet. He is up to date on MELD labs and imaging with Last MELD 3.0 of 17 in June, platelet count 353k so no indication for EGD for EV screening at this time. He is still having maybe two BMs per week, having to strain to defecate though no episodes of confusion per patient or his wife. Given his underlying CKD, last creatnine 2.17, I am hesitant to increase his diuretics any further but feel he would benefit from therapeutic paracentesis which we will get him set up for. Will recommend increasing lactulose to 20g BID given constipation. He should continue with workup for TIPS procedure as recommended by Dr. Milford Cage.    PLAN:  -Schedule therapeutic Paracentesis  - Continue lasix 80mg  BID, spironolactone 25mg  daily  -Increase lactulose to 20g BID  - Continue with TIPS workup  - Reduce salt intake to <2 g per day - Can take Tylenol max of 2 g per day (650 mg q8h) for pain - Avoid NSAIDs for pain - Avoid  eating raw oysters/shellfish - Ensure protein shake every night before going to sleep  All questions were answered, patient verbalized understanding and is in agreement with plan as outlined above.   Follow Up: 2 months   Eira Alpert L. Jeanmarie Hubert, MSN, APRN, AGNP-C Adult-Gerontology Nurse Practitioner [facility]  I have reviewed the note and agree with the APP's assessment as described in this progress note  Katrinka Blazing, MD Gastroenterology and Hepatology [facility] Gastroenterology

## 2022-12-22 NOTE — Telephone Encounter (Signed)
S/w the pt's wife and she has scheduled tele pre op app for the pt for 01/02/23 @ 1:40. Med rec and consent are done.

## 2022-12-22 NOTE — Telephone Encounter (Signed)
Patient with diagnosis of PAF on Xarelto for anticoagulation.    Procedure: TIPS Procedure  Date of procedure: TBD   CHA2DS2-VASc Score = 5   This indicates a 7.2% annual risk of stroke. The patient's score is based upon: CHF History: 1 HTN History: 1 Diabetes History: 0 Stroke History: 0 Vascular Disease History: 1 Age Score: 2 Gender Score: 0    CrCl 33 mL/min Platelet count 353 K    Per office protocol, patient can hold Xarelto  for 2 days prior to procedure.     **This guidance is not considered finalized until pre-operative APP has relayed final recommendations.**

## 2022-12-22 NOTE — Patient Instructions (Addendum)
-  Please continue with lasix 80mg  twice daily and spironolactone 25mg  daily -Continue with workup for TIPS procedures as instructed by Dr. Milford Cage -In the meantime, I will place orders for paracentesis to have some fluid drawn off -We will increase your lactulose to 20g twice daily - Reduce salt intake to <2 g per day - Can take Tylenol max of 2 g per day (650 mg q8h) for pain - Avoid NSAIDs for pain - Avoid eating raw oysters/shellfish - please drink 1 Ensure protein shake every night before going to sleep   Please let me know if you have any worsening symptoms, questions or concerns   Follow up 2 months It was a pleasure to see you today. I want to create trusting relationships with patients and provide genuine, compassionate, and quality care. I truly value your feedback! please be on the lookout for a survey regarding your visit with me today. I appreciate your input about our visit and your time in completing this!    Delane Stalling L. Jeanmarie Hubert, MSN, APRN, AGNP-C Adult-Gerontology Nurse Practitioner [facility] Gastroenterology at [facility]

## 2022-12-22 NOTE — Telephone Encounter (Signed)
S/w the pt's wife and she has scheduled tele pre op app for the pt for 01/02/23 @ 1:40. Med rec and consent are done.      Patient Consent for Virtual Visit        NANA HOSELTON has provided verbal consent on 12/22/2022 for a virtual visit (video or telephone).   CONSENT FOR VIRTUAL VISIT FOR:  Caleb Wolf  By participating in this virtual visit I agree to the following:  I hereby voluntarily request, consent and authorize [facility] and its employed or contracted physicians, physician assistants, nurse practitioners or other licensed health care professionals (the Practitioner), to provide me with telemedicine health care services (the "Services") as deemed necessary by the treating Practitioner. I acknowledge and consent to receive the Services by the Practitioner via telemedicine. I understand that the telemedicine visit will involve communicating with the Practitioner through live audiovisual communication technology and the disclosure of certain medical information by electronic transmission. I acknowledge that I have been given the opportunity to request an in-person assessment or other available alternative prior to the telemedicine visit and am voluntarily participating in the telemedicine visit.  I understand that I have the right to withhold or withdraw my consent to the use of telemedicine in the course of my care at any time, without affecting my right to future care or treatment, and that the Practitioner or I may terminate the telemedicine visit at any time. I understand that I have the right to inspect all information obtained and/or recorded in the course of the telemedicine visit and may receive copies of available information for a reasonable fee.  I understand that some of the potential risks of receiving the Services via telemedicine include:  Delay or interruption in medical evaluation due to technological equipment failure or disruption; Information transmitted may  not be sufficient (e.g. poor resolution of images) to allow for appropriate medical decision making by the Practitioner; and/or  In rare instances, security protocols could fail, causing a breach of personal health information.  Furthermore, I acknowledge that it is my responsibility to provide information about my medical history, conditions and care that is complete and accurate to the best of my ability. I acknowledge that Practitioner's advice, recommendations, and/or decision may be based on factors not within their control, such as incomplete or inaccurate data provided by me or distortions of diagnostic images or specimens that may result from electronic transmissions. I understand that the practice of medicine is not an exact science and that Practitioner makes no warranties or guarantees regarding treatment outcomes. I acknowledge that a copy of this consent can be made available to me via my patient portal [facility] MyChart), or I can request a printed copy by calling the office of [facility].    I understand that my insurance will be billed for this visit.   I have read or had this consent read to me. I understand the contents of this consent, which adequately explains the benefits and risks of the Services being provided via telemedicine.  I have been provided ample opportunity to ask questions regarding this consent and the Services and have had my questions answered to my satisfaction. I give my informed consent for the services to be provided through the use of telemedicine in my medical care

## 2022-12-23 ENCOUNTER — Ambulatory Visit (HOSPITAL_COMMUNITY): Admission: RE | Admit: 2022-12-23 | Payer: Medicare HMO | Source: Ambulatory Visit

## 2022-12-24 ENCOUNTER — Ambulatory Visit
Admission: RE | Admit: 2022-12-24 | Discharge: 2022-12-24 | Disposition: A | Discharge status: Home / Self Care | Payer: Medicare HMO | Source: Ambulatory Visit | Attending: Interventional Radiology | Admitting: Interventional Radiology

## 2022-12-24 DIAGNOSIS — K766 Portal hypertension: Secondary | ICD-10-CM

## 2022-12-24 MED ORDER — IOPAMIDOL (ISOVUE-370) INJECTION 76%
75.0000 mL | Freq: Once | INTRAVENOUS | Status: AC | PRN
  Administered 2022-12-24: 75 mL via INTRAVENOUS

## 2022-12-25 ENCOUNTER — Other Ambulatory Visit (HOSPITAL_COMMUNITY): Payer: Self-pay | Admitting: Interventional Radiology

## 2022-12-25 DIAGNOSIS — K766 Portal hypertension: Secondary | ICD-10-CM

## 2022-12-26 ENCOUNTER — Other Ambulatory Visit: Payer: Self-pay

## 2022-12-26 ENCOUNTER — Encounter (HOSPITAL_COMMUNITY): Payer: Self-pay | Admitting: Emergency Medicine

## 2022-12-26 ENCOUNTER — Inpatient Hospital Stay (HOSPITAL_COMMUNITY)
Admission: EM | Admit: 2022-12-26 | Discharge: 2022-12-29 | DRG: 372 | Disposition: A | Discharge status: Home / Self Care | Payer: Medicare HMO | Attending: Internal Medicine | Admitting: Internal Medicine

## 2022-12-26 ENCOUNTER — Encounter (HOSPITAL_COMMUNITY): Payer: Self-pay

## 2022-12-26 ENCOUNTER — Other Ambulatory Visit (INDEPENDENT_AMBULATORY_CARE_PROVIDER_SITE_OTHER): Payer: Self-pay | Admitting: Gastroenterology

## 2022-12-26 ENCOUNTER — Ambulatory Visit (HOSPITAL_COMMUNITY)
Admission: RE | Admit: 2022-12-26 | Discharge: 2022-12-26 | Disposition: A | Discharge status: Home / Self Care | Payer: Medicare HMO | Source: Ambulatory Visit | Attending: Gastroenterology | Admitting: Gastroenterology

## 2022-12-26 VITALS — BP 146/52 | HR 54 | Temp 98.0°F | Resp 18

## 2022-12-26 DIAGNOSIS — Z8679 Personal history of other diseases of the circulatory system: Secondary | ICD-10-CM

## 2022-12-26 DIAGNOSIS — K7460 Unspecified cirrhosis of liver: Secondary | ICD-10-CM | POA: Diagnosis not present

## 2022-12-26 DIAGNOSIS — I252 Old myocardial infarction: Secondary | ICD-10-CM | POA: Diagnosis not present

## 2022-12-26 DIAGNOSIS — K7682 Hepatic encephalopathy: Secondary | ICD-10-CM | POA: Diagnosis not present

## 2022-12-26 DIAGNOSIS — Z87891 Personal history of nicotine dependence: Secondary | ICD-10-CM

## 2022-12-26 DIAGNOSIS — N1832 Chronic kidney disease, stage 3b: Secondary | ICD-10-CM | POA: Diagnosis present

## 2022-12-26 DIAGNOSIS — I2510 Atherosclerotic heart disease of native coronary artery without angina pectoris: Secondary | ICD-10-CM | POA: Diagnosis not present

## 2022-12-26 DIAGNOSIS — I714 Abdominal aortic aneurysm, without rupture, unspecified: Secondary | ICD-10-CM | POA: Diagnosis not present

## 2022-12-26 DIAGNOSIS — K761 Chronic passive congestion of liver: Secondary | ICD-10-CM | POA: Diagnosis present

## 2022-12-26 DIAGNOSIS — Z7901 Long term (current) use of anticoagulants: Secondary | ICD-10-CM | POA: Diagnosis not present

## 2022-12-26 DIAGNOSIS — I8510 Secondary esophageal varices without bleeding: Secondary | ICD-10-CM | POA: Diagnosis present

## 2022-12-26 DIAGNOSIS — G4733 Obstructive sleep apnea (adult) (pediatric): Secondary | ICD-10-CM | POA: Diagnosis not present

## 2022-12-26 DIAGNOSIS — K652 Spontaneous bacterial peritonitis: Principal | ICD-10-CM | POA: Diagnosis present

## 2022-12-26 DIAGNOSIS — E785 Hyperlipidemia, unspecified: Secondary | ICD-10-CM | POA: Diagnosis not present

## 2022-12-26 DIAGNOSIS — Z96641 Presence of right artificial hip joint: Secondary | ICD-10-CM | POA: Diagnosis present

## 2022-12-26 DIAGNOSIS — N400 Enlarged prostate without lower urinary tract symptoms: Secondary | ICD-10-CM | POA: Diagnosis present

## 2022-12-26 DIAGNOSIS — Z79899 Other long term (current) drug therapy: Secondary | ICD-10-CM

## 2022-12-26 DIAGNOSIS — M519 Unspecified thoracic, thoracolumbar and lumbosacral intervertebral disc disorder: Secondary | ICD-10-CM | POA: Diagnosis not present

## 2022-12-26 DIAGNOSIS — R188 Other ascites: Secondary | ICD-10-CM | POA: Diagnosis present

## 2022-12-26 DIAGNOSIS — I4819 Other persistent atrial fibrillation: Secondary | ICD-10-CM | POA: Diagnosis not present

## 2022-12-26 DIAGNOSIS — K5900 Constipation, unspecified: Secondary | ICD-10-CM | POA: Diagnosis not present

## 2022-12-26 DIAGNOSIS — K219 Gastro-esophageal reflux disease without esophagitis: Secondary | ICD-10-CM | POA: Diagnosis not present

## 2022-12-26 DIAGNOSIS — Z85828 Personal history of other malignant neoplasm of skin: Secondary | ICD-10-CM

## 2022-12-26 DIAGNOSIS — Z885 Allergy status to narcotic agent status: Secondary | ICD-10-CM

## 2022-12-26 DIAGNOSIS — Z87442 Personal history of urinary calculi: Secondary | ICD-10-CM

## 2022-12-26 DIAGNOSIS — D649 Anemia, unspecified: Secondary | ICD-10-CM | POA: Diagnosis present

## 2022-12-26 DIAGNOSIS — Z96652 Presence of left artificial knee joint: Secondary | ICD-10-CM | POA: Diagnosis not present

## 2022-12-26 DIAGNOSIS — I130 Hypertensive heart and chronic kidney disease with heart failure and stage 1 through stage 4 chronic kidney disease, or unspecified chronic kidney disease: Secondary | ICD-10-CM | POA: Diagnosis present

## 2022-12-26 DIAGNOSIS — M109 Gout, unspecified: Secondary | ICD-10-CM | POA: Diagnosis present

## 2022-12-26 DIAGNOSIS — Z951 Presence of aortocoronary bypass graft: Secondary | ICD-10-CM

## 2022-12-26 DIAGNOSIS — I10 Essential (primary) hypertension: Secondary | ICD-10-CM | POA: Diagnosis present

## 2022-12-26 DIAGNOSIS — Z8249 Family history of ischemic heart disease and other diseases of the circulatory system: Secondary | ICD-10-CM

## 2022-12-26 DIAGNOSIS — I5022 Chronic systolic (congestive) heart failure: Secondary | ICD-10-CM | POA: Diagnosis not present

## 2022-12-26 DIAGNOSIS — Z86010 Personal history of colonic polyps: Secondary | ICD-10-CM

## 2022-12-26 DIAGNOSIS — Z7984 Long term (current) use of oral hypoglycemic drugs: Secondary | ICD-10-CM

## 2022-12-26 LAB — BODY FLUID CELL COUNT WITH DIFFERENTIAL
Eos, Fluid: 0 %
Lymphs, Fluid: 35 %
Monocyte-Macrophage-Serous Fluid: 36 % — ABNORMAL LOW (ref 50–90)
Neutrophil Count, Fluid: 29 % — ABNORMAL HIGH (ref 0–25)
Total Nucleated Cell Count, Fluid: 1025 cu mm — ABNORMAL HIGH (ref 0–1000)

## 2022-12-26 LAB — COMPREHENSIVE METABOLIC PANEL
ALT: 19 U/L (ref 0–44)
AST: 17 U/L (ref 15–41)
Albumin: 2.7 g/dL — ABNORMAL LOW (ref 3.5–5.0)
Alkaline Phosphatase: 75 U/L (ref 38–126)
Anion gap: 12 (ref 5–15)
BUN: 34 mg/dL — ABNORMAL HIGH (ref 8–23)
CO2: 28 mmol/L (ref 22–32)
Calcium: 8.3 mg/dL — ABNORMAL LOW (ref 8.9–10.3)
Chloride: 97 mmol/L — ABNORMAL LOW (ref 98–111)
Creatinine, Ser: 1.79 mg/dL — ABNORMAL HIGH (ref 0.61–1.24)
GFR, Estimated: 38 mL/min — ABNORMAL LOW (ref >60)
Glucose, Bld: 101 mg/dL — ABNORMAL HIGH (ref 70–99)
Potassium: 4.2 mmol/L (ref 3.5–5.1)
Sodium: 137 mmol/L (ref 135–145)
Total Bilirubin: 0.8 mg/dL (ref 0.3–1.2)
Total Protein: 6.3 g/dL — ABNORMAL LOW (ref 6.5–8.1)

## 2022-12-26 LAB — CBC WITH DIFFERENTIAL/PLATELET
Abs Immature Granulocytes: 0.06 10*3/uL (ref 0.00–0.07)
Basophils Absolute: 0 10*3/uL (ref 0.0–0.1)
Basophils Relative: 1 %
Eosinophils Absolute: 0.1 10*3/uL (ref 0.0–0.5)
Eosinophils Relative: 1 %
HCT: 34 % — ABNORMAL LOW (ref 39.0–52.0)
Hemoglobin: 10.6 g/dL — ABNORMAL LOW (ref 13.0–17.0)
Immature Granulocytes: 1 %
Lymphocytes Relative: 8 %
Lymphs Abs: 0.6 10*3/uL — ABNORMAL LOW (ref 0.7–4.0)
MCH: 28.2 pg (ref 26.0–34.0)
MCHC: 31.2 g/dL (ref 30.0–36.0)
MCV: 90.4 fL (ref 80.0–100.0)
Monocytes Absolute: 0.7 10*3/uL (ref 0.1–1.0)
Monocytes Relative: 8 %
Neutro Abs: 6.4 10*3/uL (ref 1.7–7.7)
Neutrophils Relative %: 81 %
Platelets: 314 10*3/uL (ref 150–400)
RBC: 3.76 MIL/uL — ABNORMAL LOW (ref 4.22–5.81)
RDW: 16.4 % — ABNORMAL HIGH (ref 11.5–15.5)
WBC: 7.8 10*3/uL (ref 4.0–10.5)
nRBC: 0 % (ref 0.0–0.2)

## 2022-12-26 LAB — CULTURE, BLOOD (ROUTINE X 2)
Culture: NO GROWTH
Culture: NO GROWTH
Special Requests: ADEQUATE
Special Requests: ADEQUATE

## 2022-12-26 LAB — GRAM STAIN

## 2022-12-26 LAB — LACTIC ACID, PLASMA
Lactic Acid, Venous: 1.4 mmol/L (ref 0.5–1.9)
Lactic Acid, Venous: 0.9 mmol/L (ref 0.5–1.9)

## 2022-12-26 LAB — CULTURE, BODY FLUID W GRAM STAIN -BOTTLE: Culture: NO GROWTH

## 2022-12-26 MED ORDER — FLUTICASONE PROPIONATE 50 MCG/ACT NA SUSP: 1.0000 | Freq: Every day | NASAL | Status: DC | PRN

## 2022-12-26 MED ORDER — ACETAMINOPHEN 650 MG RE SUPP: 650.0000 mg | Freq: Four times a day (QID) | RECTAL | Status: DC | PRN

## 2022-12-26 MED ORDER — AMIODARONE HCL 200 MG PO TABS
200.0000 mg | ORAL_TABLET | Freq: Every day | ORAL | Status: DC
  Administered 2022-12-27 – 2022-12-29 (×3): 200 mg via ORAL
  Filled 2022-12-26 (×3): qty 1

## 2022-12-26 MED ORDER — ACETAMINOPHEN 325 MG PO TABS: 650.0000 mg | ORAL_TABLET | Freq: Four times a day (QID) | ORAL | Status: DC | PRN

## 2022-12-26 MED ORDER — ALBUMIN HUMAN 25 % IV SOLN
50.0000 g | Freq: Three times a day (TID) | INTRAVENOUS | Status: AC
  Administered 2022-12-26 (×2): 50 g via INTRAVENOUS
  Filled 2022-12-26 (×2): qty 200

## 2022-12-26 MED ORDER — SPIRONOLACTONE 25 MG PO TABS
25.0000 mg | ORAL_TABLET | Freq: Every day | ORAL | Status: DC
  Administered 2022-12-27 – 2022-12-29 (×3): 25 mg via ORAL
  Filled 2022-12-26 (×3): qty 1

## 2022-12-26 MED ORDER — PANTOPRAZOLE SODIUM 40 MG PO TBEC
40.0000 mg | DELAYED_RELEASE_TABLET | Freq: Every day | ORAL | Status: DC
  Administered 2022-12-27 – 2022-12-29 (×3): 40 mg via ORAL
  Filled 2022-12-26 (×3): qty 1

## 2022-12-26 MED ORDER — SODIUM CHLORIDE 0.9 % IV SOLN
2.0000 g | INTRAVENOUS | Status: DC
  Administered 2022-12-27 – 2022-12-29 (×3): 2 g via INTRAVENOUS
  Filled 2022-12-26 (×3): qty 20

## 2022-12-26 MED ORDER — LIDOCAINE HCL (PF) 2 % IJ SOLN
10.0000 mL | Freq: Once | INTRAMUSCULAR | Status: AC
  Administered 2022-12-26: 10 mL

## 2022-12-26 MED ORDER — BOOST HIGH PROTEIN PO LIQD
1.0000 | Freq: Three times a day (TID) | ORAL | Status: DC
  Administered 2022-12-26 – 2022-12-29 (×7): 237 mL via ORAL
  Filled 2022-12-26 (×3): qty 237

## 2022-12-26 MED ORDER — ONDANSETRON HCL 4 MG/2ML IJ SOLN: 4.0000 mg | Freq: Four times a day (QID) | INTRAMUSCULAR | Status: DC | PRN

## 2022-12-26 MED ORDER — ALBUMIN HUMAN 25 % IV SOLN: 50.0000 g | Freq: Four times a day (QID) | INTRAVENOUS | Status: DC

## 2022-12-26 MED ORDER — ONDANSETRON HCL 4 MG PO TABS: 4.0000 mg | ORAL_TABLET | Freq: Four times a day (QID) | ORAL | Status: DC | PRN

## 2022-12-26 MED ORDER — SODIUM CHLORIDE 0.9% FLUSH: 3.0000 mL | INTRAVENOUS | Status: DC | PRN

## 2022-12-26 MED ORDER — VITAMIN D 25 MCG (1000 UNIT) PO TABS
1000.0000 [IU] | ORAL_TABLET | Freq: Every day | ORAL | Status: DC
  Administered 2022-12-27 – 2022-12-29 (×3): 1000 [IU] via ORAL
  Filled 2022-12-26 (×3): qty 1

## 2022-12-26 MED ORDER — SODIUM CHLORIDE 0.9 % IV SOLN: 250.0000 mL | INTRAVENOUS | Status: DC | PRN

## 2022-12-26 MED ORDER — FUROSEMIDE 40 MG PO TABS
80.0000 mg | ORAL_TABLET | Freq: Two times a day (BID) | ORAL | Status: DC
  Administered 2022-12-26 – 2022-12-29 (×6): 80 mg via ORAL
  Filled 2022-12-26 (×6): qty 2

## 2022-12-26 MED ORDER — SODIUM CHLORIDE 0.9 % IV SOLN
2.0000 g | Freq: Once | INTRAVENOUS | Status: AC
  Administered 2022-12-26: 2 g via INTRAVENOUS
  Filled 2022-12-26: qty 20

## 2022-12-26 MED ORDER — ATORVASTATIN CALCIUM 40 MG PO TABS
80.0000 mg | ORAL_TABLET | Freq: Every day | ORAL | Status: DC
  Administered 2022-12-26 – 2022-12-28 (×3): 80 mg via ORAL
  Filled 2022-12-26 (×3): qty 2

## 2022-12-26 MED ORDER — FINASTERIDE 5 MG PO TABS
5.0000 mg | ORAL_TABLET | Freq: Every evening | ORAL | Status: DC
  Administered 2022-12-26 – 2022-12-28 (×3): 5 mg via ORAL
  Filled 2022-12-26 (×3): qty 1

## 2022-12-26 MED ORDER — SODIUM CHLORIDE 0.9% FLUSH
3.0000 mL | Freq: Two times a day (BID) | INTRAVENOUS | Status: DC
  Administered 2022-12-26 – 2022-12-29 (×6): 3 mL via INTRAVENOUS

## 2022-12-26 MED ORDER — DOXAZOSIN MESYLATE 2 MG PO TABS
4.0000 mg | ORAL_TABLET | Freq: Every morning | ORAL | Status: DC
  Administered 2022-12-27 – 2022-12-29 (×3): 4 mg via ORAL
  Filled 2022-12-26 (×2): qty 1
  Filled 2022-12-26 (×2): qty 2
  Filled 2022-12-26: qty 1
  Filled 2022-12-26: qty 2

## 2022-12-26 MED ORDER — COLCHICINE 0.6 MG PO TABS: 0.6000 mg | ORAL_TABLET | Freq: Every day | ORAL | Status: DC | PRN

## 2022-12-26 MED ORDER — EMPAGLIFLOZIN 10 MG PO TABS
10.0000 mg | ORAL_TABLET | Freq: Every day | ORAL | Status: DC
  Administered 2022-12-27 – 2022-12-29 (×3): 10 mg via ORAL
  Filled 2022-12-26 (×5): qty 1

## 2022-12-26 MED ORDER — LACTULOSE 10 GM/15ML PO SOLN
20.0000 g | Freq: Two times a day (BID) | ORAL | Status: DC
  Administered 2022-12-26 – 2022-12-29 (×6): 20 g via ORAL
  Filled 2022-12-26 (×6): qty 30

## 2022-12-26 MED ORDER — RIVAROXABAN 15 MG PO TABS
15.0000 mg | ORAL_TABLET | Freq: Every day | ORAL | Status: DC
  Administered 2022-12-26 – 2022-12-27 (×2): 15 mg via ORAL
  Filled 2022-12-26 (×2): qty 1

## 2022-12-26 MED ORDER — ALBUMIN HUMAN 25 % IV SOLN: 100.0000 g | Freq: Once | INTRAVENOUS | Status: DC

## 2022-12-26 NOTE — Consult Note (Signed)
Gastroenterology Consult   Referring Provider: No ref. provider found Primary Care Physician:  Caleb Perches, MD Primary Gastroenterologist:  Dr. Levon Wolf  Patient ID: Caleb Wolf; [id number]; August 06, 1940   Admit date: 12/26/2022  LOS: 0 days   Date of Consultation: 12/26/2022  Reason for Consultation:  probable SBP  History of Present Illness   Caleb Wolf is a 82 y.o. 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  He required paracentesis x 4 in June and has been maintained on Lasix 80 mg twice daily and spironolactone 25 mg daily with baseline creatinine 2.17 and given renal disease there is hesitancy to increase his diuretics any further and he presented to the clinic 7/22 with significant distention therefore paracentesis ordered for outpatient and he is undergoing evaluation for possible TIPS procedure.  GI consulted to assist in management of SBP.  ED course: Paracentesis this morning with total nucleated cell count of 1025 with 29% neutrophil count resulting in PMN > 250 indicating possible SBP.  Gram stain with WBCs present, both PMN and mononuclear without organisms seen.  Culture in process.  Labs in ED: Hemoglobin 10.6, platelets 314, BUN 34, creatinine 1.79, albumin 2.7.  Normal LFTs.  Lactic acid 1.4. Blood cultures obtained and in process.  Given 2 g Rocephin IV once in the ED  Consult: Has felt a little "off" or not feeling well the last couple of days but nothing severe. Denies any changes in appetite, fever, chills over the last several days. Wife reports he had some nausea yesterday morning but no vomiting and symptoms were short lived. He is still having Bms every few days, just  recently increased lactulose dosing. Denies any abdominal pain, jaundice, increased confusion or hallucinations. States he was supposed to have another paracentesis Monday given he was told he had a significant amount of ascites but order was to only remove 4L at one time. (Advised Wife that pending clinical course will determine if we repeat para on Monday). Patient and wife had many questions about SBP that I addressed during my exam.   MELD 3.0 17 in June with stable platelet count of 353.  No prior EGD on file and given no evidence of thrombocytopenia there is no need for esophageal variceal screening at this time.  During his last office visit he was reporting difficulty with straining and only having about 2 bowel movements per week but no signs of hepatic encephalopathy.  To help with constipation his lactulose was increased to 20 g twice daily.  Diet recommendations were reinforced.  Currently undergoing TIPs evaluation. Has echo next week and recently completed CTA for evaluation. Has upcomming follow up telephone visit with cardiology as well to obtain clearance for procedure.      Past Medical History:  Diagnosis Date   AAA (abdominal aortic aneurysm) (HCC)    Arthritis    Basal cell carcinoma    Carotid stenosis    Chronic kidney disease, stage 3b (HCC) 12/13/2021   Coronary artery disease    Multvessel s/p CABG 2015   Enlarged prostate    Essential hypertension    GERD (gastroesophageal reflux disease)    Gout    History of colon polyps    History  of kidney stones    Hyperlipidemia    Lumbar disc disease    Persistent atrial fibrillation (HCC)    Sleep apnea    CPAP    Past Surgical History:  Procedure Laterality Date   ABLATION OF DYSRHYTHMIC FOCUS  07/28/2017   ATRIAL FIBRILLATION ABLATION N/A 07/28/2017   Procedure: ATRIAL FIBRILLATION ABLATION;  Surgeon: Hillis Range, MD;  Location: [facility];  Service: Cardiovascular;  Laterality: N/A;   ATRIAL  FIBRILLATION ABLATION N/A 12/04/2020   Procedure: ATRIAL FIBRILLATION ABLATION;  Surgeon: Hillis Range, MD;  Location: [facility]AS[facility] CV [facility];  Service: Cardiovascular;  Laterality: N/A;   BACK SURGERY     CARDIAC CATHETERIZATION  05/22/2014   Procedure: IABP INSERTION;  Surgeon: Marykay Lex, MD;  Location: [facility] CATH LAB;  Service: Cardiovascular;;   CARDIOVERSION N/A 04/29/2017   Procedure: CARDIOVERSION;  Surgeon: Jonelle Sidle, MD;  Location: [facility] ENDO SUITE;  Service: Cardiovascular;  Laterality: N/A;   CARDIOVERSION N/A 08/13/2017   Procedure: CARDIOVERSION;  Surgeon: Chrystie Nose, MD;  Location: [facility] ENDOSCOPY;  Service: Cardiovascular;  Laterality: N/A;   CARDIOVERSION N/A 08/15/2019   Procedure: CARDIOVERSION;  Surgeon: Lars Masson, MD;  Location: [facility] ENDOSCOPY;  Service: Cardiovascular;  Laterality: N/A;   CARDIOVERSION N/A 03/09/2020   Procedure: CARDIOVERSION;  Surgeon: Jodelle Red, MD;  Location: [facility] ENDOSCOPY;  Service: Cardiovascular;  Laterality: N/A;   CARDIOVERSION N/A 07/06/2020   Procedure: CARDIOVERSION;  Surgeon: Thurmon Fair, MD;  Location: [facility] ENDOSCOPY;  Service: Cardiovascular;  Laterality: N/A;   CARDIOVERSION N/A 01/18/2021   Procedure: CARDIOVERSION;  Surgeon: Chilton Si, MD;  Location: [facility] ENDOSCOPY;  Service: Cardiovascular;  Laterality: N/A;   CARDIOVERSION N/A 06/17/2021   Procedure: CARDIOVERSION;  Surgeon: Sande Rives, MD;  Location: [facility] ENDOSCOPY;  Service: Cardiovascular;  Laterality: N/A;   Cataract surgery Right    COLONOSCOPY     COLONOSCOPY N/A 12/30/2017   Procedure: COLONOSCOPY;  Surgeon: Malissa Hippo, MD;  Location: [facility];  Service: Endoscopy;  Laterality: N/A;   CORONARY ARTERY BYPASS GRAFT N/A 05/22/2014   Procedure: CORONARY ARTERY BYPASS GRAFTING (CABG) times three using left internal mammary and right saphenous vein.;  Surgeon: Loreli Slot, MD;  Location: [facility] OR;  Service: Open Heart Surgery;   Laterality: N/A;   ENDARTERECTOMY Left 02/17/2014   Procedure: ENDARTERECTOMY CAROTID WITH PATCH ANGIOPLASTY;  Surgeon: Pryor Ochoa, MD;  Location: [facility] OR;  Service: Vascular;  Laterality: Left;   ESOPHAGEAL DILATION N/A 08/22/2015   Procedure: ESOPHAGEAL DILATION;  Surgeon: Malissa Hippo, MD;  Location: [facility];  Service: Endoscopy;  Laterality: N/A;   ESOPHAGOGASTRODUODENOSCOPY N/A 08/22/2015   Procedure: ESOPHAGOGASTRODUODENOSCOPY (EGD);  Surgeon: Malissa Hippo, MD;  Location: [facility];  Service: Endoscopy;  Laterality: N/A;  12:45 - moved to 1:55 - Ann notified pt   ESOPHAGOGASTRODUODENOSCOPY N/A 12/30/2017   Procedure: ESOPHAGOGASTRODUODENOSCOPY (EGD);  Surgeon: Malissa Hippo, MD;  Location: [facility];  Service: Endoscopy;  Laterality: N/A;  200   EYE SURGERY     cataract extraction (right) with repair macular tear , with IOL     right   FRACTURE SURGERY     bilateral wrist fractures- one ORIF   IR RADIOLOGIST EVAL & MGMT  12/01/2022   JOINT REPLACEMENT  2011   left knee   LEFT HEART CATHETERIZATION WITH CORONARY ANGIOGRAM N/A 05/22/2014   Procedure: LEFT HEART CATHETERIZATION WITH CORONARY ANGIOGRAM;  Surgeon: Marykay Lex, MD;  Location: [facility]  CATH LAB;  Service: Cardiovascular;  Laterality: N/A;   LITHOTRIPSY     PARS PLANA VITRECTOMY W/ REPAIR OF MACULAR HOLE     RHINOPLASTY     TEE WITHOUT CARDIOVERSION N/A 04/29/2017   Procedure: TRANSESOPHAGEAL ECHOCARDIOGRAM (TEE) WITH PROPOFOL;  Surgeon: Jonelle Sidle, MD;  Location: [facility]O [facility]ITE;  Service: Cardiovascular;  Laterality: N/A;   TONSILLECTOMY     TOTAL HIP ARTHROPLASTY  12/08/2011   Procedure: TOTAL HIP ARTHROPLASTY;  Surgeon: Loanne Drilling, MD;  Location: [facility] ORS;  Service: Orthopedics;  Laterality: Right;   UPPER GI ENDOSCOPY  12/18/2015   Procedure: UPPER GI ENDOSCOPY;  Surgeon: Axel Filler, MD;  Location: [facility] ORS;  Service: General;;   WRIST SURGERY Right 43yrs ago   WRIST SURGERY Left     Prior  to Admission medications   Medication Sig Start Date End Date Taking? Authorizing Provider  acetaminophen (TYLENOL) 500 MG tablet Take 1,000 mg by mouth every 6 (six) hours as needed for moderate pain.    [provider]  amiodarone (PACERONE) 200 MG tablet Take 1 tablet (200 mg total) by mouth daily. 10/24/22   Jake Bathe, MD  amoxicillin (AMOXIL) 500 MG capsule Take 2,000 mg by mouth See admin instructions. Take 4 capsules (2000 mg) by mouth before dental procedures 01/24/20   [provider]  atorvastatin (LIPITOR) 80 MG tablet TAKE 1 TABLET BY MOUTH EVERY DAY IN THE EVENING 04/03/22   Jake Bathe, MD  cholecalciferol (VITAMIN D3) 25 MCG (1000 UNIT) tablet Take 1,000 Units by mouth daily.    [provider]  colchicine 0.6 MG tablet Take 0.6 mg by mouth daily as needed (for gout flare up).    [provider]  cyanocobalamin (VITAMIN B12) 1000 MCG tablet Take 1,000 mcg by mouth as directed. 2-3 TIMES WEEKLY PER PT'S WIFE    [provider]  doxazosin (CARDURA) 4 MG tablet Take 4 mg by mouth in the morning. 07/06/17   [provider]  empagliflozin (JARDIANCE) 10 MG TABS tablet Take 1 tablet (10 mg total) by mouth daily before breakfast. 06/11/22   Jake Bathe, MD  feeding supplement (BOOST HIGH PROTEIN) LIQD Take 1 Container by mouth 3 (three) times daily between meals. One shake before bedtime.    [provider]  finasteride (PROSCAR) 5 MG tablet Take 5 mg by mouth every evening.     [provider]  fluticasone (FLONASE) 50 MCG/ACT nasal spray Place 1 spray into both nostrils daily as needed for allergies or rhinitis.    [provider]  furosemide (LASIX) 80 MG tablet Take 1 tablet (80 mg total) by mouth 2 (two) times daily. 06/11/22   Jake Bathe, MD  lactulose (CHRONULAC) 10 GM/15ML solution Take 30 mLs (20 g total) by mouth 2 (two) times daily. Can increase to twice a day depending on constipation  improvement 12/22/22   Carlan, Chelsea L, NP  lisinopril (ZESTRIL) 40 MG tablet Take 40 mg by mouth daily. Patient not taking: Reported on 12/22/2022    [provider]  nitroGLYCERIN (NITROSTAT) 0.4 MG SL tablet Place 1 tablet (0.4 mg total) under the tongue every 5 (five) minutes x 3 doses as needed for chest pain. 06/04/20   Allred, Fayrene Fearing, MD  pantoprazole (PROTONIX) 40 MG tablet Take 1 tablet (40 mg total) by mouth daily before breakfast. 02/19/16   Rehman, Joline Maxcy, MD  Rivaroxaban (XARELTO) 15 MG TABS tablet Take 1 tablet (15 mg total) by  mouth daily with supper. 12/16/21   Catarina Hartshorn, MD  spironolactone (ALDACTONE) 25 MG tablet Take 1 tablet (25 mg total) by mouth daily. 11/10/22   Dolores Frame, MD    No current facility-administered medications for this encounter.   Current Outpatient Medications  Medication Sig Dispense Refill   acetaminophen (TYLENOL) 500 MG tablet Take 1,000 mg by mouth every 6 (six) hours as needed for moderate pain.     amiodarone (PACERONE) 200 MG tablet Take 1 tablet (200 mg total) by mouth daily. 90 tablet 1   amoxicillin (AMOXIL) 500 MG capsule Take 2,000 mg by mouth See admin instructions. Take 4 capsules (2000 mg) by mouth before dental procedures     atorvastatin (LIPITOR) 80 MG tablet TAKE 1 TABLET BY MOUTH EVERY DAY IN THE EVENING 90 tablet 3   cholecalciferol (VITAMIN D3) 25 MCG (1000 UNIT) tablet Take 1,000 Units by mouth daily.     colchicine 0.6 MG tablet Take 0.6 mg by mouth daily as needed (for gout flare up).     cyanocobalamin (VITAMIN B12) 1000 MCG tablet Take 1,000 mcg by mouth as directed. 2-3 TIMES WEEKLY PER PT'S WIFE     doxazosin (CARDURA) 4 MG tablet Take 4 mg by mouth in the morning.  4   empagliflozin (JARDIANCE) 10 MG TABS tablet Take 1 tablet (10 mg total) by mouth daily before breakfast. 30 tablet 11   feeding supplement (BOOST HIGH PROTEIN) LIQD Take 1 Container by mouth 3 (three) times daily between meals. One shake  before bedtime.     finasteride (PROSCAR) 5 MG tablet Take 5 mg by mouth every evening.      fluticasone (FLONASE) 50 MCG/ACT nasal spray Place 1 spray into both nostrils daily as needed for allergies or rhinitis.     furosemide (LASIX) 80 MG tablet Take 1 tablet (80 mg total) by mouth 2 (two) times daily. 180 tablet 3   lactulose (CHRONULAC) 10 GM/15ML solution Take 30 mLs (20 g total) by mouth 2 (two) times daily. Can increase to twice a day depending on constipation improvement 1892 mL 2   lisinopril (ZESTRIL) 40 MG tablet Take 40 mg by mouth daily. (Patient not taking: Reported on 12/22/2022)     nitroGLYCERIN (NITROSTAT) 0.4 MG SL tablet Place 1 tablet (0.4 mg total) under the tongue every 5 (five) minutes x 3 doses as needed for chest pain. 75 tablet 3   pantoprazole (PROTONIX) 40 MG tablet Take 1 tablet (40 mg total) by mouth daily before breakfast. 90 tablet 3   Rivaroxaban (XARELTO) 15 MG TABS tablet Take 1 tablet (15 mg total) by mouth daily with supper. 30 tablet 1   spironolactone (ALDACTONE) 25 MG tablet Take 1 tablet (25 mg total) by mouth daily. 90 tablet 0    Allergies as of 12/26/2022 - Review Complete 12/26/2022  Allergen Reaction Noted   Codeine sulfate Nausea Only 12/01/2011    Family History  Problem Relation Age of Onset   Allergies Mother    Heart disease Mother    Hypertension Mother    Heart disease Father        MI    Social History   Socioeconomic History   Marital status: Married    Spouse name: Not on file   Number of children: Not on file   Years of education: Not on file   Highest education level: Not on file  Occupational History   Occupation: retired    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  Tobacco  Use   Smoking status: Former    Current packs/day: 0.00    Average packs/day: 1.5 packs/day for 25.0 years (37.5 ttl pk-yrs)    Types: Cigarettes    Start date: 08/08/1960    Quit date: 06/02/1982    Years since quitting: 40.5    Passive exposure: Never    Smokeless tobacco: Never   Tobacco comments:    quit smoking 30+yrs ago  Vaping Use   Vaping status: Never Used  Substance and Sexual Activity   Alcohol use: No    Alcohol/week: 0.0 standard drinks of alcohol    Comment: occasionally    Drug use: No   Sexual activity: Yes  Other Topics Concern   Not on file  Social History Narrative   Not on file   Social Determinants of Health   Financial Resource Strain: Not on file  Food Insecurity: Not on file  Transportation Needs: Not on file  Physical Activity: Not on file  Stress: Not on file  Social Connections: Not on file  Intimate Partner Violence: Not on file     Review of Systems   Gen: Denies any fever, chills, loss of appetite, change in weight or weight loss CV: Denies chest pain, heart palpitations, syncope, edema  Resp: Denies shortness of breath with rest, cough, wheezing, coughing up blood, and pleurisy. GI: Denies vomiting blood, jaundice, and fecal incontinence.   Denies dysphagia or odynophagia. GU : Denies urinary burning, blood in urine, urinary frequency, and urinary incontinence. MS: Denies joint pain, limitation of movement, swelling, cramps, and atrophy.  Derm: Denies rash, itching, dry skin, hives. Psych: Denies depression, anxiety, memory loss, hallucinations, and confusion. Heme: Denies bruising or bleeding Neuro:  Denies any headaches, dizziness, paresthesias, shaking  Physical Exam   Vital Signs in last 24 hours: Temp:  [97.5 F (36.4 C)-98 F (36.7 C)] 97.5 F (36.4 C) (07/26 1118) Pulse Rate:  [54-59] 57 (07/26 1118) Resp:  [16-18] 16 (07/26 1118) BP: (111-153)/(52-75) 111/65 (07/26 1118) SpO2:  [96 %-99 %] 97 % (07/26 1118) Weight:  [79 kg-83.9 kg] 79 kg (07/26 1122)    General:   Alert,  Well-developed, well-nourished, pleasant and cooperative in NAD Head:  Normocephalic and atraumatic. Eyes:  Sclera clear, no icterus.   Conjunctiva pink. Ears:  Normal auditory acuity. Mouth:  No  deformity or lesions, dentition normal. Neck:  Supple; no masses Lungs:  Clear throughout to auscultation.   No wheezes, crackles, or rhonchi. No acute distress. Heart:  Regular rate and rhythm; no murmurs, clicks, rubs, or gallops. Abdomen:  Soft, distended but not taut and nontender.  Umbilical hernia noted. No masses. Normal bowel sounds, without guarding, and without rebound.   Rectal: deferred   Msk:  Symmetrical without gross deformities. Normal posture. Extremities:  +1 edema to LLE around ankle, no edema otherwise.  Neurologic:  Alert and  oriented x4. Skin:  Intact without significant lesions or rashes. Psych:  Alert and cooperative. Normal mood and affect.  Intake/Output from previous day: No intake/output data recorded. Intake/Output this shift: No intake/output data recorded.  Labs/Studies   Recent Labs Recent Labs    12/26/22 1214  WBC 7.8  HGB 10.6*  HCT 34.0*  PLT 314   BMET Recent Labs    12/26/22 1214  NA 137  K 4.2  CL 97*  CO2 28  GLUCOSE 101*  BUN 34*  CREATININE 1.79*  CALCIUM 8.3*   LFT Recent Labs    12/26/22 1214  PROT 6.3*  ALBUMIN  2.7*  AST 17  ALT 19  ALKPHOS 75  BILITOT 0.8   PT/INR No results for input(s): "LABPROT", "INR" in the last 72 hours. Hepatitis Panel No results for input(s): "HEPBSAG", "HCVAB", "HEPAIGM", "HEPBIGM" in the last 72 hours. C-Diff No results for input(s): "CDIFFTOX" in the last 72 hours.  Radiology/Studies US Paracentesis  Result Date: 12/26/2022 INDICATION: Cirrhosis and ascites. EXAM: ULTRASOUND GUIDED PARACENTESIS MEDICATIONS: None. COMPLICATIONS: None immediate. PROCEDURE: Informed written consent was obtained from the patient after a discussion of the risks, benefits and alternatives to treatment. A timeout was performed prior to the initiation of the procedure. Initial ultrasound scanning demonstrates a large amount of ascites within the right lower abdominal quadrant. The right lower abdomen was  prepped and draped in the usual sterile fashion. 1% lidocaine was used for local anesthesia. Following this, a 6 Fr Safe-T-Centesis catheter was introduced. An ultrasound image was saved for documentation purposes. The paracentesis was performed. The catheter was removed and a dressing was applied. The patient tolerated the procedure well without immediate post procedural complication. FINDINGS: A total of approximately 4 L of yellow, turbid fluid was removed. Samples were sent to the laboratory as requested by the clinical team. IMPRESSION: Successful ultrasound-guided paracentesis yielding 4 liters of peritoneal fluid. Electronically Signed   By: Irish Lack [name]   On: 12/26/2022 10:20     Assessment   Caleb Wolf is a 82 y.o. 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   Cirrhosis: Likely secondary to congestive hepatopathy/long-term amiodarone use.  Prior workup with negative viral hepatitis, negative AMA and ASMA.  AFP within normal limits.  Cirrhosis newly diagnosed around July 2023 when he had pneumonia and was hospitalized and presented with severe abdominal distention related to ascites and had been managed by diuretics.  He unfortunately did not have any GI follow-up after this up until June of this year after PCP order right upper quadrant ultrasound and refer to GI.  He does have a history of alcohol use but quit 20 years ago.  Received paracentesis x 4 in June and presented to recent follow-up outpatient with recurrent ascites despite receiving Lasix 80 mg twice daily and spironolactone 25 mg daily.  No evidence of portal hypertension or thrombocytopenia therefore no EGD has been  performed for esophageal variceal screening.  Given his CKD will keep diuretics at current doses and continue to encourage 2 g sodium diet.  Given concerns for SBP on outpatient paracentesis today he was called to come to hospital for admission and management as per below. Currently undergoing TIPs evaluation.   SBP: Paracentesis this morning with removal of 4 L of yellow turbid fluid.  No prior history of SBP.  Did have increased total nucleated cell count on last paracentesis 6/25 but with low neutrophil count.  Paracentesis this morning with total nucleated cell count of 1025 but with 29% neutrophil count resulting in PMN > 250 indicating possible SBP.  Gram stain has been negative and culture in process.  Will need to continue to follow this to assess for possible contamination. He will receive IV antibiotics for treatment until culture results to determine ongoing need.  No evidence of GI bleed. Pending clinical course will  determine if repeat para needed on Monday.   Plan / Recommendations   Rocephin 2g IV daily for 5 days Give albumin 25% 1.5 g/kg today will given 50g every 8 hours to not fluid overload patient given his HF history Albumin 25% 1g/kg on day 3 Resume home diuretics Follow up body fluid culture Follow up Blood cultures Heart healthy diet CBC,CMP and INR tomorrow     12/26/2022, 2:35 PM  Brooke Bonito, MSN, FNP-BC, AGACNP-BC [facility]

## 2022-12-26 NOTE — ED Triage Notes (Signed)
Pt via POV after receiving a call from his doctor advising him to come for treatment. Pt has PMH liver disease and CHF with significant ascites; he had a paracentesis procedure this morning and received a call to inform him that the lab result shows infection in his abdominal cavity. Pt denies physical complaints and feels consistent with his baseline. A/O x 4, ambulatory with cane.

## 2022-12-26 NOTE — ED Notes (Signed)
Antibiotics will be administered once blood cultures have been obtained

## 2022-12-26 NOTE — H&P (Signed)
History and Physical    Caleb Wolf FAO:[id number] DOB: [birth year] DOA: 12/26/2022  PCP: Carylon Perches, MD   Patient coming from: Home  Chief Complaint: SBP  HPI: Caleb Wolf is a 82 y.o. male with medical history significant for carotid stenosis, CKD stage IIIb, coronary artery disease post CABG, hypertension, GERD, dyslipidemia, atrial fibrillation, OSA, and CHF with EF 45% who presented to the ED with signs of SBP noted on paracentesis earlier.  He had 4 L of paracentesis performed.  He overall feels well and denies any other symptoms of abdominal pain, fevers, chills, nausea, vomiting, or diarrhea.  He is currently awaiting TIPS procedure.   ED Course: Vital signs stable and patient afebrile.  Hemoglobin 10.6 and creatinine 1.79.  EDP spoke with GI who recommends initiating antibiotics and administering albumin and will follow along and consult for treatment of SBP.  Review of Systems: Reviewed as noted above, otherwise negative.  Past Medical History:  Diagnosis Date   AAA (abdominal aortic aneurysm) (HCC)    Arthritis    Basal cell carcinoma    Carotid stenosis    Chronic kidney disease, stage 3b (HCC) 12/13/2021   Coronary artery disease    Multvessel s/p CABG 2015   Enlarged prostate    Essential hypertension    GERD (gastroesophageal reflux disease)    Gout    History of colon polyps    History of kidney stones    Hyperlipidemia    Lumbar disc disease    Persistent atrial fibrillation (HCC)    Sleep apnea    CPAP    Past Surgical History:  Procedure Laterality Date   ABLATION OF DYSRHYTHMIC FOCUS  07/28/2017   ATRIAL FIBRILLATION ABLATION N/A 07/28/2017   Procedure: ATRIAL FIBRILLATION ABLATION;  Surgeon: Hillis Range, MD;  Location: [facility];  Service: Cardiovascular;  Laterality: N/A;   ATRIAL FIBRILLATION ABLATION N/A 12/04/2020   Procedure: ATRIAL FIBRILLATION ABLATION;  Surgeon: Hillis Range, MD;  Location: [facility];  Service: Cardiovascular;   Laterality: N/A;   BACK SURGERY     CARDIAC CATHETERIZATION  05/22/2014   Procedure: IABP INSERTION;  Surgeon: Marykay Lex, MD;  Location: [facility] CATH LAB;  Service: Cardiovascular;;   CARDIOVERSION N/A 04/29/2017   Procedure: CARDIOVERSION;  Surgeon: Jonelle Sidle, MD;  Location: [facility];  Service: Cardiovascular;  Laterality: N/A;   CARDIOVERSION N/A 08/13/2017   Procedure: CARDIOVERSION;  Surgeon: Chrystie Nose, MD;  Location: [facility] ENDOSCOPY;  Service: Cardiovascular;  Laterality: N/A;   CARDIOVERSION N/A 08/15/2019   Procedure: CARDIOVERSION;  Surgeon: Lars Masson, MD;  Location: [facility] ENDOSCOPY;  Service: Cardiovascular;  Laterality: N/A;   CARDIOVERSION N/A 03/09/2020   Procedure: CARDIOVERSION;  Surgeon: Jodelle Red, MD;  Location: [facility] ENDOSCOPY;  Service: Cardiovascular;  Laterality: N/A;   CARDIOVERSION N/A 07/06/2020   Procedure: CARDIOVERSION;  Surgeon: Thurmon Fair, MD;  Location: [facility] ENDOSCOPY;  Service: Cardiovascular;  Laterality: N/A;   CARDIOVERSION N/A 01/18/2021   Procedure: CARDIOVERSION;  Surgeon: Chilton Si, MD;  Location: [facility] ENDOSCOPY;  Service: Cardiovascular;  Laterality: N/A;   CARDIOVERSION N/A 06/17/2021   Procedure: CARDIOVERSION;  Surgeon: Sande Rives, MD;  Location: [facility] ENDOSCOPY;  Service: Cardiovascular;  Laterality: N/A;   Cataract surgery Right    COLONOSCOPY     COLONOSCOPY N/A 12/30/2017   Procedure: COLONOSCOPY;  Surgeon: Malissa Hippo, MD;  Location: [facility];  Service: Endoscopy;  Laterality: N/A;   CORONARY ARTERY BYPASS GRAFT N/A 05/22/2014  Procedure: CORONARY ARTERY BYPASS GRAFTING (CABG) times three using left internal mammary and right saphenous vein.;  Surgeon: Loreli Slot, MD;  Location: [facility] OR;  Service: Open Heart Surgery;  Laterality: N/A;   ENDARTERECTOMY Left 02/17/2014   Procedure: ENDARTERECTOMY CAROTID WITH PATCH ANGIOPLASTY;  Surgeon: Pryor Ochoa, MD;  Location: [facility] OR;  Service:  Vascular;  Laterality: Left;   ESOPHAGEAL DILATION N/A 08/22/2015   Procedure: ESOPHAGEAL DILATION;  Surgeon: Malissa Hippo, MD;  Location: [facility];  Service: Endoscopy;  Laterality: N/A;   ESOPHAGOGASTRODUODENOSCOPY N/A 08/22/2015   Procedure: ESOPHAGOGASTRODUODENOSCOPY (EGD);  Surgeon: Malissa Hippo, MD;  Location: [facility];  Service: Endoscopy;  Laterality: N/A;  12:45 - moved to 1:55 - Ann notified pt   ESOPHAGOGASTRODUODENOSCOPY N/A 12/30/2017   Procedure: ESOPHAGOGASTRODUODENOSCOPY (EGD);  Surgeon: Malissa Hippo, MD;  Location: [facility];  Service: Endoscopy;  Laterality: N/A;  200   EYE SURGERY     cataract extraction (right) with repair macular tear , with IOL     right   FRACTURE SURGERY     bilateral wrist fractures- one ORIF   IR RADIOLOGIST EVAL & MGMT  12/01/2022   JOINT REPLACEMENT  2011   left knee   LEFT HEART CATHETERIZATION WITH CORONARY ANGIOGRAM N/A 05/22/2014   Procedure: LEFT HEART CATHETERIZATION WITH CORONARY ANGIOGRAM;  Surgeon: Marykay Lex, MD;  Location: [facility] CATH LAB;  Service: Cardiovascular;  Laterality: N/A;   LITHOTRIPSY     PARS PLANA VITRECTOMY W/ REPAIR OF MACULAR HOLE     RHINOPLASTY     TEE WITHOUT CARDIOVERSION N/A 04/29/2017   Procedure: TRANSESOPHAGEAL ECHOCARDIOGRAM (TEE) WITH PROPOFOL;  Surgeon: Jonelle Sidle, MD;  Location: [facility];  Service: Cardiovascular;  Laterality: N/A;   TONSILLECTOMY     TOTAL HIP ARTHROPLASTY  12/08/2011   Procedure: TOTAL HIP ARTHROPLASTY;  Surgeon: Loanne Drilling, MD;  Location: [facility] ORS;  Service: Orthopedics;  Laterality: Right;   UPPER GI ENDOSCOPY  12/18/2015   Procedure: UPPER GI ENDOSCOPY;  Surgeon: Axel Filler, MD;  Location: [facility] ORS;  Service: General;;   WRIST SURGERY Right 24yrs ago   WRIST SURGERY Left      reports that he quit smoking about 40 years ago. His smoking use included cigarettes. He started smoking about 62 years ago. He has a 37.5 pack-year smoking history. He has  never been exposed to tobacco smoke. He has never used smokeless tobacco. He reports that he does not drink alcohol and does not use drugs.  Allergies  Allergen Reactions   Codeine Sulfate Nausea Only    Family History  Problem Relation Age of Onset   Allergies Mother    Heart disease Mother    Hypertension Mother    Heart disease Father        MI    Prior to Admission medications   Medication Sig Start Date End Date Taking? Authorizing Provider  acetaminophen (TYLENOL) 500 MG tablet Take 1,000 mg by mouth every 6 (six) hours as needed for moderate pain.    [provider]  amiodarone (PACERONE) 200 MG tablet Take 1 tablet (200 mg total) by mouth daily. 10/24/22   Jake Bathe, MD  amoxicillin (AMOXIL) 500 MG capsule Take 2,000 mg by mouth See admin instructions. Take 4 capsules (2000 mg) by mouth before dental procedures 01/24/20   [provider]  atorvastatin (LIPITOR) 80 MG tablet TAKE 1 TABLET BY MOUTH EVERY DAY IN THE EVENING 04/03/22  Jake Bathe, MD  cholecalciferol (VITAMIN D3) 25 MCG (1000 UNIT) tablet Take 1,000 Units by mouth daily.    [provider]  colchicine 0.6 MG tablet Take 0.6 mg by mouth daily as needed (for gout flare up).    [provider]  cyanocobalamin (VITAMIN B12) 1000 MCG tablet Take 1,000 mcg by mouth as directed. 2-3 TIMES WEEKLY PER PT'S WIFE    [provider]  doxazosin (CARDURA) 4 MG tablet Take 4 mg by mouth in the morning. 07/06/17   [provider]  empagliflozin (JARDIANCE) 10 MG TABS tablet Take 1 tablet (10 mg total) by mouth daily before breakfast. 06/11/22   Jake Bathe, MD  feeding supplement (BOOST HIGH PROTEIN) LIQD Take 1 Container by mouth 3 (three) times daily between meals. One shake before bedtime.    [provider]  finasteride (PROSCAR) 5 MG tablet Take 5 mg by mouth every evening.     [provider]  fluticasone (FLONASE) 50 MCG/ACT nasal spray Place 1  spray into both nostrils daily as needed for allergies or rhinitis.    [provider]  furosemide (LASIX) 80 MG tablet Take 1 tablet (80 mg total) by mouth 2 (two) times daily. 06/11/22   Jake Bathe, MD  lactulose (CHRONULAC) 10 GM/15ML solution Take 30 mLs (20 g total) by mouth 2 (two) times daily. Can increase to twice a day depending on constipation improvement 12/22/22   Carlan, Chelsea L, NP  lisinopril (ZESTRIL) 40 MG tablet Take 40 mg by mouth daily. Patient not taking: Reported on 12/22/2022    [provider]  nitroGLYCERIN (NITROSTAT) 0.4 MG SL tablet Place 1 tablet (0.4 mg total) under the tongue every 5 (five) minutes x 3 doses as needed for chest pain. 06/04/20   Allred, Fayrene Fearing, MD  pantoprazole (PROTONIX) 40 MG tablet Take 1 tablet (40 mg total) by mouth daily before breakfast. 02/19/16   Rehman, Joline Maxcy, MD  Rivaroxaban (XARELTO) 15 MG TABS tablet Take 1 tablet (15 mg total) by mouth daily with supper. 12/16/21   Catarina Hartshorn, MD  spironolactone (ALDACTONE) 25 MG tablet Take 1 tablet (25 mg total) by mouth daily. 11/10/22   Dolores Frame, MD    Physical Exam: Vitals:   12/26/22 1118 12/26/22 1122  BP: 111/65   Pulse: (!) 57   Resp: 16   Temp: (!) 97.5 F (36.4 C)   TempSrc: Oral   SpO2: 97%   Weight: 83.9 kg 79 kg  Height: 5\' 9"  (1.753 m)     Constitutional: NAD, calm, comfortable Vitals:   12/26/22 1118 12/26/22 1122  BP: 111/65   Pulse: (!) 57   Resp: 16   Temp: (!) 97.5 F (36.4 C)   TempSrc: Oral   SpO2: 97%   Weight: 83.9 kg 79 kg  Height: 5\' 9"  (1.753 m)    Eyes: lids and conjunctivae normal Neck: normal, supple Respiratory: clear to auscultation bilaterally. Normal respiratory effort. No accessory muscle use.  Cardiovascular: Regular rate and rhythm, no murmurs. Abdomen: no tenderness, mildly distended. Bowel sounds positive.  Musculoskeletal:  No edema. Skin: no rashes, lesions, ulcers.  Psychiatric: Flat affect  Labs on  Admission: I have personally reviewed following labs and imaging studies  CBC: Recent Labs  Lab 12/26/22 1214  WBC 7.8  NEUTROABS 6.4  HGB 10.6*  HCT 34.0*  MCV 90.4  PLT 314   Basic Metabolic Panel: Recent Labs  Lab 12/26/22 1214  NA 137  K  4.2  CL 97*  CO2 28  GLUCOSE 101*  BUN 34*  CREATININE 1.79*  CALCIUM 8.3*   GFR: Estimated Creatinine Clearance: 32.9 mL/min (A) (by C-G formula based on SCr of 1.79 mg/dL (H)). Liver Function Tests: Recent Labs  Lab 12/26/22 1214  AST 17  ALT 19  ALKPHOS 75  BILITOT 0.8  PROT 6.3*  ALBUMIN 2.7*   No results for input(s): "LIPASE", "AMYLASE" in the last 168 hours. No results for input(s): "AMMONIA" in the last 168 hours. Coagulation Profile: No results for input(s): "INR", "PROTIME" in the last 168 hours. Cardiac Enzymes: No results for input(s): "CKTOTAL", "CKMB", "CKMBINDEX", "TROPONINI" in the last 168 hours. BNP (last 3 results) Recent Labs    12/31/21 1640  PROBNP 829.0*   HbA1C: No results for input(s): "HGBA1C" in the last 72 hours. CBG: No results for input(s): "GLUCAP" in the last 168 hours. Lipid Profile: No results for input(s): "CHOL", "HDL", "LDLCALC", "TRIG", "CHOLHDL", "LDLDIRECT" in the last 72 hours. Thyroid Function Tests: No results for input(s): "TSH", "T4TOTAL", "FREET4", "T3FREE", "THYROIDAB" in the last 72 hours. Anemia Panel: No results for input(s): "VITAMINB12", "FOLATE", "FERRITIN", "TIBC", "IRON", "RETICCTPCT" in the last 72 hours. Urine analysis:    Component Value Date/Time   COLORURINE YELLOW 02/16/2014 1333   APPEARANCEUR CLEAR 02/16/2014 1333   LABSPEC 1.012 02/16/2014 1333   PHURINE 5.5 02/16/2014 1333   GLUCOSEU NEGATIVE 02/16/2014 1333   HGBUR NEGATIVE 02/16/2014 1333   BILIRUBINUR NEGATIVE 02/16/2014 1333   KETONESUR NEGATIVE 02/16/2014 1333   PROTEINUR NEGATIVE 02/16/2014 1333   UROBILINOGEN 0.2 02/16/2014 1333   NITRITE NEGATIVE 02/16/2014 1333   LEUKOCYTESUR  NEGATIVE 02/16/2014 1333    Radiological Exams on Admission: US Paracentesis  Result Date: 12/26/2022 INDICATION: Cirrhosis and ascites. EXAM: ULTRASOUND GUIDED PARACENTESIS MEDICATIONS: None. COMPLICATIONS: None immediate. PROCEDURE: Informed written consent was obtained from the patient after a discussion of the risks, benefits and alternatives to treatment. A timeout was performed prior to the initiation of the procedure. Initial ultrasound scanning demonstrates a large amount of ascites within the right lower abdominal quadrant. The right lower abdomen was prepped and draped in the usual sterile fashion. 1% lidocaine was used for local anesthesia. Following this, a 6 Fr Safe-T-Centesis catheter was introduced. An ultrasound image was saved for documentation purposes. The paracentesis was performed. The catheter was removed and a dressing was applied. The patient tolerated the procedure well without immediate post procedural complication. FINDINGS: A total of approximately 4 L of yellow, turbid fluid was removed. Samples were sent to the laboratory as requested by the clinical team. IMPRESSION: Successful ultrasound-guided paracentesis yielding 4 liters of peritoneal fluid. Electronically Signed   By: Irish Lack [name]   On: 12/26/2022 10:20     Assessment/Plan Principal Problem:   SBP (spontaneous bacterial peritonitis) (HCC) Active Problems:   HTN (hypertension)   S/P CABG x 3   GERD (gastroesophageal reflux disease)   Persistent atrial fibrillation (HCC)   Absolute anemia   HLD (hyperlipidemia)   CAD (coronary artery disease)   Chronic kidney disease, stage 3b (HCC)   Hepatic cirrhosis (HCC)   Ascites    SBP -Continue on Rocephin per GI recommendations -Monitor cultures -Administer albumin  Newly diagnosed cirrhosis with ascites -Status post 4 L paracentesis -Continue spironolactone and Lasix  Chronic systolic CHF -EF 09% -Continue Lasix 80 mg twice daily and  Jardiance -Continue spironolactone  CAD status post CABG 2015/dyslipidemia -Without anginal symptoms -Continue statin -Follows with Dr. Anne Fu  Hypertension -Continue  lisinopril and monitor  Paroxysmal atrial fibrillation -Continue amiodarone 200 mg/day -No longer on Toprol  Chronic anticoagulation -Continue Xarelto for atrial fibrillation -Okay to stop Xarelto 2 days prior to paracentesis  CKD stage IIIb -At baseline creatinine which is normally near 1.9-2.6  BPH -Continue finasteride   DVT prophylaxis: Xarelto Code Status: Full Family Communication: None at bedside Disposition Plan: Admit for treatment of SBP Consults called: GI Admission status: Inpatient, Med/Surg  Severity of Illness: The appropriate patient status for this patient is INPATIENT. Inpatient status is judged to be reasonable and necessary in order to provide the required intensity of service to ensure the patient's safety. The patient's presenting symptoms, physical exam findings, and initial radiographic and laboratory data in the context of their chronic comorbidities is felt to place them at high risk for further clinical deterioration. Furthermore, it is not anticipated that the patient will be medically stable for discharge from the hospital within 2 midnights of admission.   * I certify that at the point of admission it is my clinical judgment that the patient will require inpatient hospital care spanning beyond 2 midnights from the point of admission due to high intensity of service, high risk for further deterioration and high frequency of surveillance required.*   Wesson Stith D Teri Legacy DO [facility]  If 7PM-7AM, please contact night-coverage [email or web address]  12/26/2022, 2:47 PM

## 2022-12-26 NOTE — Progress Notes (Signed)
Patient tolerated right sided paracentesis procedure well today and 4 Liters of cloudy yellow ascites removed with labs collected and sent for processing. Patient verbalized understanding of discharge instructions and left with wife via wheelchair with no acute distress noted.

## 2022-12-26 NOTE — ED Provider Notes (Signed)
[facility] AT [facility] Provider Note   CSN: [id number] Arrival date & time: 12/26/22  1106     History  Chief Complaint  Patient presents with   Abnormal Lab    Caleb Wolf is a 82 y.o. male.  Patient with history of CHF, liver disease, HTN, ascites, NSTEMI, CABG, CAD, afib, hyperlipidemia, CKD presents today with complaints of abnormal lab. States that he has his regularly scheduled paracentesis earlier today with his GI outpatient. He received a call that the fluid showed signs of infection and was told to come here for IV antibiotics and admission.  He presents for same.  He states that he feels well overall.  He denies any symptoms.  No abdominal pain, fevers, chills, nausea, vomiting, or diarrhea.  Of note, patient is in the process of getting a TIPS procedure scheduled for his ascites and liver disease.   The history is provided by the patient. No language interpreter was used.  Abnormal Lab      Home Medications Prior to Admission medications   Medication Sig Start Date End Date Taking? Authorizing Provider  acetaminophen (TYLENOL) 500 MG tablet Take 1,000 mg by mouth every 6 (six) hours as needed for moderate pain.    [provider]  amiodarone (PACERONE) 200 MG tablet Take 1 tablet (200 mg total) by mouth daily. 10/24/22   Jake Bathe, MD  amoxicillin (AMOXIL) 500 MG capsule Take 2,000 mg by mouth See admin instructions. Take 4 capsules (2000 mg) by mouth before dental procedures 01/24/20   [provider]  atorvastatin (LIPITOR) 80 MG tablet TAKE 1 TABLET BY MOUTH EVERY DAY IN THE EVENING 04/03/22   Jake Bathe, MD  cholecalciferol (VITAMIN D3) 25 MCG (1000 UNIT) tablet Take 1,000 Units by mouth daily.    [provider]  colchicine 0.6 MG tablet Take 0.6 mg by mouth daily as needed (for gout flare up).    [provider]  cyanocobalamin (VITAMIN B12) 1000 MCG tablet Take 1,000 mcg by mouth as  directed. 2-3 TIMES WEEKLY PER PT'S WIFE    [provider]  doxazosin (CARDURA) 4 MG tablet Take 4 mg by mouth in the morning. 07/06/17   [provider]  empagliflozin (JARDIANCE) 10 MG TABS tablet Take 1 tablet (10 mg total) by mouth daily before breakfast. 06/11/22   Jake Bathe, MD  feeding supplement (BOOST HIGH PROTEIN) LIQD Take 1 Container by mouth 3 (three) times daily between meals. One shake before bedtime.    [provider]  finasteride (PROSCAR) 5 MG tablet Take 5 mg by mouth every evening.     [provider]  fluticasone (FLONASE) 50 MCG/ACT nasal spray Place 1 spray into both nostrils daily as needed for allergies or rhinitis.    [provider]  furosemide (LASIX) 80 MG tablet Take 1 tablet (80 mg total) by mouth 2 (two) times daily. 06/11/22   Jake Bathe, MD  lactulose (CHRONULAC) 10 GM/15ML solution Take 30 mLs (20 g total) by mouth 2 (two) times daily. Can increase to twice a day depending on constipation improvement 12/22/22   Carlan, Chelsea L, NP  lisinopril (ZESTRIL) 40 MG tablet Take 40 mg by mouth daily. Patient not taking: Reported on 12/22/2022    [provider]  nitroGLYCERIN (NITROSTAT) 0.4 MG SL tablet Place 1 tablet (0.4 mg total) under the tongue every 5 (five) minutes x 3 doses as needed for chest pain. 06/04/20  Hillis Range, MD  pantoprazole (PROTONIX) 40 MG tablet Take 1 tablet (40 mg total) by mouth daily before breakfast. 02/19/16   Rehman, Joline Maxcy, MD  Rivaroxaban (XARELTO) 15 MG TABS tablet Take 1 tablet (15 mg total) by mouth daily with supper. 12/16/21   Catarina Hartshorn, MD  spironolactone (ALDACTONE) 25 MG tablet Take 1 tablet (25 mg total) by mouth daily. 11/10/22   Dolores Frame, MD      Allergies    Codeine sulfate    Review of Systems   Review of Systems  All other systems reviewed and are negative.   Physical Exam Updated Vital Signs BP 111/65 (BP Location: Right Arm)   Pulse (!)  57   Temp (!) 97.5 F (36.4 C) (Oral)   Resp 16   Ht 5\' 9"  (1.753 m)   Wt 79 kg   SpO2 97%   BMI 25.72 kg/m  Physical Exam Vitals and nursing note reviewed.  Constitutional:      General: He is not in acute distress.    Appearance: Normal appearance. He is normal weight. He is not ill-appearing, toxic-appearing or diaphoretic.  HENT:     Head: Normocephalic and atraumatic.  Cardiovascular:     Rate and Rhythm: Normal rate.  Pulmonary:     Effort: Pulmonary effort is normal. No respiratory distress.  Abdominal:     General: There is distension.     Palpations: Abdomen is soft.     Tenderness: There is no abdominal tenderness.  Musculoskeletal:        General: Normal range of motion.     Cervical back: Normal range of motion.  Skin:    General: Skin is warm and dry.  Neurological:     General: No focal deficit present.     Mental Status: He is alert.  Psychiatric:        Mood and Affect: Mood normal.        Behavior: Behavior normal.     ED Results / Procedures / Treatments   Labs (all labs ordered are listed, but only abnormal results are displayed) Labs Reviewed  CBC WITH DIFFERENTIAL/PLATELET - Abnormal; Notable for the following components:      Result Value   RBC 3.76 (*)    Hemoglobin 10.6 (*)    HCT 34.0 (*)    RDW 16.4 (*)    Lymphs Abs 0.6 (*)    All other components within normal limits  COMPREHENSIVE METABOLIC PANEL - Abnormal; Notable for the following components:   Chloride 97 (*)    Glucose, Bld 101 (*)    BUN 34 (*)    Creatinine, Ser 1.79 (*)    Calcium 8.3 (*)    Total Protein 6.3 (*)    Albumin 2.7 (*)    GFR, Estimated 38 (*)    All other components within normal limits  CULTURE, BLOOD (ROUTINE X 2)  CULTURE, BLOOD (ROUTINE X 2)  LACTIC ACID, PLASMA  LACTIC ACID, PLASMA    EKG None  Radiology US Paracentesis  Result Date: 12/26/2022 INDICATION: Cirrhosis and ascites. EXAM: ULTRASOUND GUIDED PARACENTESIS MEDICATIONS: None.  COMPLICATIONS: None immediate. PROCEDURE: Informed written consent was obtained from the patient after a discussion of the risks, benefits and alternatives to treatment. A timeout was performed prior to the initiation of the procedure. Initial ultrasound scanning demonstrates a large amount of ascites within the right lower abdominal quadrant. The right lower abdomen was prepped and draped in the usual sterile fashion. 1% lidocaine was  used for local anesthesia. Following this, a 6 Fr Safe-T-Centesis catheter was introduced. An ultrasound image was saved for documentation purposes. The paracentesis was performed. The catheter was removed and a dressing was applied. The patient tolerated the procedure well without immediate post procedural complication. FINDINGS: A total of approximately 4 L of yellow, turbid fluid was removed. Samples were sent to the laboratory as requested by the clinical team. IMPRESSION: Successful ultrasound-guided paracentesis yielding 4 liters of peritoneal fluid. Electronically Signed   By: Irish Lack [name]   On: 12/26/2022 10:20    Procedures Procedures    Medications Ordered in ED Medications  cefTRIAXone (ROCEPHIN) 2 g in sodium chloride 0.9 % 100 mL IVPB (2 g Intravenous New Bag/Given 12/26/22 1257)    ED Course/ Medical Decision Making/ A&P Clinical Course as of 12/26/22 1306  Fri Dec 26, 2022  1227 Patient had a routine paracentesis today and the fluid looked possibly infected and he was instructed to come to the emergency department.  He denies any specific complaints.  Getting lab work antibiotics and will review with GI for recommendations. [MB]    Clinical Course User Index [MB] Terrilee Files, MD                             Medical Decision Making Amount and/or Complexity of Data Reviewed Labs: ordered.   This patient is a 82 y.o. male who presents to the ED for concern of SBP on outpatient labs, this involves an extensive number of treatment  options, and is a complaint that carries with it a high risk of complications and morbidity.  Past Medical History / Co-morbidities / Social History: history of CHF, liver disease, HTN, ascites, NSTEMI, CABG, CAD, afib, hyperlipidemia, CKD  Additional history: Chart reviewed. Pertinent results include: Seen by Doylene Bode, NP with [facility] who performed paracentesis this morning and 4 L was drained.  Patient's cell count from this showed 297 PMNs concerning for SBP.  She then called the patient and recommended IV antibiotics and admission.  Physical Exam: Physical exam performed. The pertinent findings include: Abdominal distention consistent with ascites, no tenderness.  Lab Tests: I ordered, and personally interpreted labs.  The pertinent results include:  hgb 10.6. Creatinine 1.79 consistent with previous. Lactic WNL. Cultures pending   Medications: I ordered medication including Rocephin for SBP.  Consultations Obtained: I requested consultation with the Gi Dr. Tasia Catchings,  and discussed lab and imaging findings as well as pertinent plan - they recommend: Rocephin 2g x 5 days with Albumin 1g/kg on day 1 and day 3 for SBP. Admit to medicine, Gi will consult   Disposition: After consideration of the diagnostic results and the patients response to treatment, I feel that patient will require admission for SBP per his GI recommendations.  Patient is understanding and in agreement with this plan.  Discussed patient with hospitalist who agrees to admit.  I discussed this case with my attending physician Dr. Charm Barges who cosigned this note including patient's presenting symptoms, physical exam, and planned diagnostics and interventions. Attending physician stated agreement with plan or made changes to plan which were implemented.    Final Clinical Impression(s) / ED Diagnoses Final diagnoses:  SBP (spontaneous bacterial peritonitis) [facility])    Rx / DC Orders ED Discharge Orders     None          Vear Clock 12/26/22 1420    Terrilee Files, MD  12/26/22 1744  

## 2022-12-26 NOTE — ED Notes (Signed)
ED TO INPATIENT HANDOFF REPORT  ED Nurse Name and Phone #:  Milagros Loll [phone]  S Name/Age/Gender Caleb Wolf 82 y.o. male Room/Bed: APA10/APA10  Code Status   Code Status: Full Code  Home/SNF/Other Home Patient oriented to: situation Is this baseline? Yes   Triage Complete: Triage complete  Chief Complaint SBP (spontaneous bacterial peritonitis) (HCC) [K65.2]  Triage Note Pt via POV after receiving a call from his doctor advising him to come for treatment. Pt has PMH liver disease and CHF with significant ascites; he had a paracentesis procedure this morning and received a call to inform him that the lab result shows infection in his abdominal cavity. Pt denies physical complaints and feels consistent with his baseline. A/O x 4, ambulatory with cane.    Allergies Allergies  Allergen Reactions   Codeine Sulfate Nausea Only    Level of Care/Admitting Diagnosis ED Disposition     ED Disposition  Admit   Condition  --   Comment  Hospital Area: [facility] [[id number]]  Level of Care: Med-Surg [16]  Covid Evaluation: Asymptomatic - no recent exposure (last 10 days) testing not required  Diagnosis: SBP (spontaneous bacterial peritonitis) [facility]) [[id number]]  Admitting Physician: Erick Blinks [[id number]]  Attending Physician: Maurilio Lovely D [[id number]]  Certification:: I certify this patient will need inpatient services for at least 2 midnights  Estimated Length of Stay: 2          B Medical/Surgery History Past Medical History:  Diagnosis Date   AAA (abdominal aortic aneurysm) (HCC)    Arthritis    Basal cell carcinoma    Carotid stenosis    Chronic kidney disease, stage 3b (HCC) 12/13/2021   Coronary artery disease    Multvessel s/p CABG 2015   Enlarged prostate    Essential hypertension    GERD (gastroesophageal reflux disease)    Gout    History of colon polyps    History of kidney stones    Hyperlipidemia    Lumbar disc disease    Persistent atrial  fibrillation (HCC)    Sleep apnea    CPAP   Past Surgical History:  Procedure Laterality Date   ABLATION OF DYSRHYTHMIC FOCUS  07/28/2017   ATRIAL FIBRILLATION ABLATION N/A 07/28/2017   Procedure: ATRIAL FIBRILLATION ABLATION;  Surgeon: Hillis Range, MD;  [facility]: [facility] INVASIVE CV LAB;  Service: Cardiovascular;  Laterality: N/A;   ATRIAL FIBRILLATION ABLATION N/A 12/04/2020   Procedure: ATRIAL FIBRILLATION ABLATION;  Surgeon: Hillis Range, MD;  Location: [facility] INVASIVE CV LAB;  Service: Cardiovascular;  Laterality: N/A;   BACK SURGERY     CARDIAC CATHETERIZATION  05/22/2014   Procedure: IABP INSERTION;  Surgeon: Marykay Lex, MD;  Location: [facility] CATH LAB;  Service: Cardiovascular;;   CARDIOVERSION N/A 04/29/2017   Procedure: CARDIOVERSION;  Surgeon: Jonelle Sidle, MD;  Location: [facility] ENDO SUITE;  Service: Cardiovascular;  Laterality: N/A;   CARDIOVERSION N/A 08/13/2017   Procedure: CARDIOVERSION;  Surgeon: Chrystie Nose, MD;  Location: [facility] ENDOSCOPY;  Service: Cardiovascular;  Laterality: N/A;   CARDIOVERSION N/A 08/15/2019   Procedure: CARDIOVERSION;  Surgeon: Lars Masson, MD;  Location: [facility] ENDOSCOPY;  Service: Cardiovascular;  Laterality: N/A;   CARDIOVERSION N/A 03/09/2020   Procedure: CARDIOVERSION;  Surgeon: Jodelle Red, MD;  Location: [facility] ENDOSCOPY;  Service: Cardiovascular;  Laterality: N/A;   CARDIOVERSION N/A 07/06/2020   Procedure: CARDIOVERSION;  Surgeon: Thurmon Fair, MD;  Location: [facility] ENDOSCOPY;  Service: Cardiovascular;  Laterality: N/A;   CARDIOVERSION N/A 01/18/2021  Procedure: CARDIOVERSION;  Surgeon: Chilton Si, MD;  Location: [facility] ENDOSCOPY;  Service: Cardiovascular;  Laterality: N/A;   CARDIOVERSION N/A 06/17/2021   Procedure: CARDIOVERSION;  Surgeon: Sande Rives, MD;  Location: [facility] ENDOSCOPY;  Service: Cardiovascular;  Laterality: N/A;   Cataract surgery Right    COLONOSCOPY     COLONOSCOPY N/A 12/30/2017   Procedure: COLONOSCOPY;   Surgeon: Malissa Hippo, MD;  [facility] ENDO [facility];  Service: Endoscopy;  Laterality: N/A;   CORONARY ARTERY BYPASS GRAFT N/A 05/22/2014   Procedure: CORONARY ARTERY BYPASS GRAFTING (CABG) times three using left internal mammary and right saphenous vein.;  Surgeon: Loreli Slot, MD;  Location: [facility] OR;  Service: Open Heart Surgery;  Laterality: N/A;   ENDARTERECTOMY Left 02/17/2014   Procedure: ENDARTERECTOMY CAROTID WITH PATCH ANGIOPLASTY;  Surgeon: Pryor Ochoa, MD;  Location: [facility] OR;  Service: Vascular;  Laterality: Left;   ESOPHAGEAL DILATION N/A 08/22/2015   Procedure: ESOPHAGEAL DILATION;  Surgeon: Malissa Hippo, MD;  [facility];  Service: Endoscopy;  Laterality: N/A;   ESOPHAGOGASTRODUODENOSCOPY N/A 08/22/2015   Procedure: ESOPHAGOGASTRODUODENOSCOPY (EGD);  Surgeon: Malissa Hippo, MD;  [facility];  Service: Endoscopy;  Laterality: N/A;  12:45 - moved to 1:55 - Ann notified pt   ESOPHAGOGASTRODUODENOSCOPY N/A 12/30/2017   Procedure: ESOPHAGOGASTRODUODENOSCOPY (EGD);  Surgeon: Malissa Hippo, MD;  [facility] END[facility];  Service: Endoscopy;  Laterality: N/A;  200   EYE SURGERY     cataract extraction (right) with repair macular tear , with IOL     right   FRACTURE SURGERY     bilateral wrist fractures- one ORIF   IR RADIOLOGIST EVAL & MGMT  12/01/2022   JOINT REPLACEMENT  2011   left knee   LEFT HEART CATHETERIZATION WITH CORONARY ANGIOGRAM N/A 05/22/2014   Procedure: LEFT HEART CATHETERIZATION WITH CORONARY ANGIOGRAM;  Surgeon: Marykay Lex, MD;  Location: [facility] CATH LAB;  Service: Cardiovascular;  Laterality: N/A;   LITHOTRIPSY     PARS PLANA VITRECTOMY W/ REPAIR OF MACULAR HOLE     RHINOPLASTY     TEE WITHOUT CARDIOVERSION N/A 04/29/2017   Procedure: TRANSESOPHAGEAL ECHOCARDIOGRAM (TEE) WITH PROPOFOL;  Surgeon: Jonelle Sidle, MD;  [facility];  Service: Cardiovascular;  Laterality: N/A;   TONSILLECTOMY     TOTAL HIP  ARTHROPLASTY  12/08/2011   Procedure: TOTAL HIP ARTHROPLASTY;  Surgeon: Loanne Drilling, MD;  Location: [facility] ORS;  Service: Orthopedics;  Laterality: Right;   UPPER GI ENDOSCOPY  12/18/2015   Procedure: UPPER GI ENDOSCOPY;  Surgeon: Axel Filler, MD;  Location: [facility] ORS;  Service: General;;   WRIST SURGERY Right 61yrs ago   WRIST SURGERY Left      A IV Location/Drains/Wounds Patient Lines/Drains/Airways Status     Active Line/Drains/Airways     Name Placement date Placement time Site Days   Peripheral IV 12/26/22 20 G Right Wrist 12/26/22  1223  Wrist  less than 1            Intake/Output Last 24 hours No intake or output data in the 24 hours ending 12/26/22 1939  Labs/Imaging Results for orders placed or performed during the hospital encounter of 12/26/22 (from the past 48 hour(s))  Blood culture (routine x 2)     Status: None (Preliminary result)   Collection Time: 12/26/22 11:57 AM   Specimen: BLOOD  Result Value Ref Range   Specimen Description      BLOOD RIGHT ASSIST CONTROL Performed  at [facility], [address] [place], [place] [address]    Special Requests      BOTTLES DRAWN AEROBIC AND ANAEROBIC Blood Culture adequate volume Performed at [facility], [address], [place], [place] [address]    Culture      NO GROWTH < 12 HOURS Performed at [facility], [address] [place], [place] [address]    Report Status PENDING   CBC with Differential     Status: Abnormal   Collection Time: 12/26/22 12:14 PM  Result Value Ref Range   WBC 7.8 4.0 - 10.5 K/uL   RBC 3.76 (L) 4.22 - 5.81 MIL/uL   Hemoglobin 10.6 (L) 13.0 - 17.0 g/dL   HCT 91.4 (L) 78.2 - 95.6 %   MCV 90.4 80.0 - 100.0 fL   MCH 28.2 26.0 - 34.0 pg   MCHC 31.2 30.0 - 36.0 g/dL   RDW 21.3 (H) 08.6 - 57.8 %   Platelets 314 150 - 400 K/uL   nRBC 0.0 0.0 - 0.2 %   Neutrophils Relative % 81 %   Neutro Abs 6.4 1.7 - 7.7 K/uL   Lymphocytes Relative 8 %   Lymphs Abs 0.6 (L) 0.7 - 4.0 K/uL    Monocytes Relative 8 %   Monocytes Absolute 0.7 0.1 - 1.0 K/uL   Eosinophils Relative 1 %   Eosinophils Absolute 0.1 0.0 - 0.5 K/uL   Basophils Relative 1 %   Basophils Absolute 0.0 0.0 - 0.1 K/uL   Immature Granulocytes 1 %   Abs Immature Granulocytes 0.06 0.00 - 0.07 K/uL    Comment: Performed at [facility], [address], [place], [place] [address]  Comprehensive metabolic panel     Status: Abnormal   Collection Time: 12/26/22 12:14 PM  Result Value Ref Range   Sodium 137 135 - 145 mmol/L   Potassium 4.2 3.5 - 5.1 mmol/L   Chloride 97 (L) 98 - 111 mmol/L   CO2 28 22 - 32 mmol/L   Glucose, Bld 101 (H) 70 - 99 mg/dL    Comment: Glucose reference range applies only to samples taken after fasting for at least 8 hours.   BUN 34 (H) 8 - 23 mg/dL   Creatinine, Ser 9.52 (H) 0.61 - 1.24 mg/dL   Calcium 8.3 (L) 8.9 - 10.3 mg/dL   Total Protein 6.3 (L) 6.5 - 8.1 g/dL   Albumin 2.7 (L) 3.5 - 5.0 g/dL   AST 17 15 - 41 U/L   ALT 19 0 - 44 U/L   Alkaline Phosphatase 75 38 - 126 U/L   Total Bilirubin 0.8 0.3 - 1.2 mg/dL   GFR, Estimated 38 (L) >60 mL/min    Comment: (NOTE) Calculated using the CKD-EPI Creatinine Equation (2021)    Anion gap 12 5 - 15    Comment: Performed at [facility], [address], [place], [place] [address]  Lactic acid, plasma     Status: None   Collection Time: 12/26/22 12:14 PM  Result Value Ref Range   Lactic Acid, Venous 1.4 0.5 - 1.9 mmol/L    Comment: Performed at [facility], [address] [place], [place] [address]  Blood culture (routine x 2)     Status: None (Preliminary result)   Collection Time: 12/26/22 12:14 PM   Specimen: BLOOD  Result Value Ref Range   Specimen Description      BLOOD LEFT ASSIST CONTROL Performed at [facility], [address], [place], [place] [address]  Special Requests      BOTTLES DRAWN AEROBIC AND ANAEROBIC Blood Culture adequate volume Performed at [facility], [address], [place], [place] [address]     Culture      NO GROWTH < 12 HOURS Performed at [facility], [address] [place], [place] [address]    Report Status PENDING   Lactic acid, plasma     Status: None   Collection Time: 12/26/22  2:08 PM  Result Value Ref Range   Lactic Acid, Venous 0.9 0.5 - 1.9 mmol/L    Comment: Performed at [name], [address] [place], [place] [address]   US Paracentesis  Result Date: 12/26/2022 INDICATION: Cirrhosis and ascites. EXAM: ULTRASOUND GUIDED PARACENTESIS MEDICATIONS: None. COMPLICATIONS: None immediate. PROCEDURE: Informed written consent was obtained from the patient after a discussion of the risks, benefits and alternatives to treatment. A timeout was performed prior to the initiation of the procedure. Initial ultrasound scanning demonstrates a large amount of ascites within the right lower abdominal quadrant. The right lower abdomen was prepped and draped in the usual sterile fashion. 1% lidocaine was used for local anesthesia. Following this, a 6 Fr Safe-T-Centesis catheter was introduced. An ultrasound image was saved for documentation purposes. The paracentesis was performed. The catheter was removed and a dressing was applied. The patient tolerated the procedure well without immediate post procedural complication. FINDINGS: A total of approximately 4 L of yellow, turbid fluid was removed. Samples were sent to the laboratory as requested by the clinical team. IMPRESSION: Successful ultrasound-guided paracentesis yielding 4 liters of peritoneal fluid. Electronically Signed   By: Irish Lack [name]   On: 12/26/2022 10:20    Pending Labs Unresulted Labs (From admission, onward)     Start     Ordered   12/27/22 0500  Magnesium  Tomorrow morning,   R        12/26/22 1512   12/27/22 0500  Comprehensive metabolic panel  Tomorrow morning,   R        12/26/22 1512   12/27/22 0500  CBC  Tomorrow morning,   R        12/26/22 1512   12/27/22 0500  Protime-INR  Tomorrow morning,    R        12/26/22 1529            Vitals/Pain Today's Vitals   12/26/22 1600 12/26/22 1630 12/26/22 1700 12/26/22 1730  BP: 136/69 (!) 138/49 109/89 (!) 129/44  Pulse: (!) 54 (!) 52 61 (!) 49  Resp:    18  Temp:      TempSrc:      SpO2: 99% 98% 97% 96%  Weight:      Height:      PainSc:        Isolation Precautions No active isolations  Medications Medications  colchicine tablet 0.6 mg (has no administration in time range)  amiodarone (PACERONE) tablet 200 mg (200 mg Oral Not Given 12/26/22 1640)  atorvastatin (LIPITOR) tablet 80 mg (has no administration in time range)  doxazosin (CARDURA) tablet 4 mg (has no administration in time range)  furosemide (LASIX) tablet 80 mg (80 mg Oral Given 12/26/22 1733)  spironolactone (ALDACTONE) tablet 25 mg (25 mg Oral Not Given 12/26/22 1640)  empagliflozin (JARDIANCE) tablet 10 mg (has no administration in time range)  lactulose (CHRONULAC) 10 GM/15ML solution 20 g (has no administration in time range)  pantoprazole (PROTONIX) EC tablet 40 mg (has no administration in time range)  finasteride (PROSCAR) tablet 5 mg (5 mg Oral Given 12/26/22 1733)  Rivaroxaban (XARELTO) tablet 15 mg (15 mg Oral Given 12/26/22 1733)  feeding supplement (BOOST HIGH PROTEIN) liquid 237 mL (has no administration in time range)  cholecalciferol (VITAMIN D3) 25 MCG (1000 UNIT) tablet 1,000 Units (1,000 Units Oral Not Given 12/26/22 1640)  fluticasone (FLONASE) 50 MCG/ACT nasal spray 1 spray (has no administration in time range)  sodium chloride flush (NS) 0.9 % injection 3 mL (has no administration in time range)  sodium chloride flush (NS) 0.9 % injection 3 mL (has no administration in time range)  0.9 %  sodium chloride infusion (has no administration in time range)  acetaminophen (TYLENOL) tablet 650 mg (has no administration in time range)    Or  acetaminophen (TYLENOL) suppository 650 mg (has no administration in time range)  ondansetron (ZOFRAN) tablet 4  mg (has no administration in time range)    Or  ondansetron (ZOFRAN) injection 4 mg (has no administration in time range)  cefTRIAXone (ROCEPHIN) 2 g in sodium chloride 0.9 % 100 mL IVPB (has no administration in time range)  albumin human 25 % solution 50 g (50 g Intravenous New Bag/Given 12/26/22 1734)  cefTRIAXone (ROCEPHIN) 2 g in sodium chloride 0.9 % 100 mL IVPB (0 g Intravenous Stopped 12/26/22 1407)    Mobility walks     Focused Assessments Renal Assessment Handoff:  Hemodialysis Schedule:  Last Hemodialysis date and time:    Restricted appendage:    R Recommendations: See Admitting Provider Note  Report given to:   Additional Notes: N/A

## 2022-12-27 DIAGNOSIS — K652 Spontaneous bacterial peritonitis: Secondary | ICD-10-CM | POA: Diagnosis not present

## 2022-12-27 NOTE — Plan of Care (Signed)

## 2022-12-27 NOTE — Progress Notes (Signed)
Patient admitted from ED overnight, abdomen is presenting swollen due to the peritonitis, patient up to the bathroom with assist, family member at bedside, no complaints of pain, patient rested through the night.

## 2022-12-27 NOTE — Progress Notes (Signed)
PROGRESS NOTE    Caleb Wolf  VWU:[id number] DOB: 1940-06-11 DOA: 12/26/2022 PCP: Caleb Perches, MD   Brief Narrative:    Caleb Wolf is a 82 y.o. male with medical history significant for carotid stenosis, CKD stage IIIb, coronary artery disease post CABG, hypertension, GERD, dyslipidemia, atrial fibrillation, OSA, and CHF with EF 45% who presented to the ED with signs of SBP noted on paracentesis earlier.  He has empirically been started on IV antibiotics with further cultures pending.  GI following.  Assessment & Plan:   Principal Problem:   SBP (spontaneous bacterial peritonitis) (HCC) Active Problems:   HTN (hypertension)   S/P CABG x 3   GERD (gastroesophageal reflux disease)   Persistent atrial fibrillation (HCC)   Absolute anemia   HLD (hyperlipidemia)   CAD (coronary artery disease)   Chronic kidney disease, stage 3b (HCC)   Hepatic cirrhosis (HCC)   Ascites  Assessment and Plan:  SBP -Continue on Rocephin per GI recommendations -Monitor cultures -Administer albumin per GI   Newly diagnosed cirrhosis with ascites -Status post 4 L paracentesis -Continue spironolactone and Lasix   Chronic systolic CHF -EF 29% -Continue Lasix 80 mg twice daily and Jardiance -Continue spironolactone   CAD status post CABG 2015/dyslipidemia -Without anginal symptoms -Continue statin -Follows with Caleb Wolf   Hypertension -Continue lisinopril and monitor   Paroxysmal atrial fibrillation -Continue amiodarone 200 mg/day -No longer on Toprol   Chronic anticoagulation -Continue Xarelto for atrial fibrillation -Okay to stop Xarelto 2 days prior to paracentesis   CKD stage IIIb -At baseline creatinine which is normally near 1.9-2.6   BPH -Continue finasteride   DVT prophylaxis:Xarelto Code Status: Full Family Communication: Wife at bedside 7/27 Disposition Plan: Continue IV antibiotics for SBP Status is: Inpatient Remains inpatient appropriate because: Need for IV  antibiotics.  Consultants:  GI  Procedures:  None  Antimicrobials:  Anti-infectives (From admission, onward)    Start     Dose/Rate Route Frequency Ordered Stop   12/27/22 1000  cefTRIAXone (ROCEPHIN) 2 g in sodium chloride 0.9 % 100 mL IVPB        2 g 200 mL/hr over 30 Minutes Intravenous Every 24 hours 12/26/22 1512     12/26/22 1200  cefTRIAXone (ROCEPHIN) 2 g in sodium chloride 0.9 % 100 mL IVPB        2 g 200 mL/hr over 30 Minutes Intravenous  Once 12/26/22 1155 12/26/22 1407      Subjective: Patient seen and evaluated today with no new acute complaints or concerns. No acute concerns or events noted overnight.  He denies any abdominal pain.  Objective: Vitals:   12/27/22 0414 12/27/22 0830 12/27/22 0854 12/27/22 0912  BP: (!) 112/51  (!) 120/45   Pulse: (!) 57  (!) 48 62  Resp: 18  18   Temp: 97.7 F (36.5 C)  98.5 F (36.9 C)   TempSrc: Oral  Oral   SpO2: 99% 94% 95%   Weight:      Height:        Intake/Output Summary (Last 24 hours) at 12/27/2022 1044 Last data filed at 12/27/2022 0318 Gross per 24 hour  Intake 400 ml  Output --  Net 400 ml   Filed Weights   12/26/22 1118 12/26/22 1122 12/26/22 2020  Weight: 83.9 kg 79 kg 79.7 kg    Examination:  General exam: Appears calm and comfortable  Respiratory system: Clear to auscultation. Respiratory effort normal. Cardiovascular system: S1 & S2 heard, RRR.  Gastrointestinal  system: Abdomen is soft, moderately distended Central nervous system: Alert and awake Extremities: No edema Skin: No significant lesions noted Psychiatry: Flat affect.    Data Reviewed: I have personally reviewed following labs and imaging studies  CBC: Recent Labs  Lab 12/26/22 1214 12/27/22 0544  WBC 7.8 7.4  NEUTROABS 6.4  --   HGB 10.6* 8.5*  HCT 34.0* 27.9*  MCV 90.4 90.6  PLT 314 261   Basic Metabolic Panel: Recent Labs  Lab 12/26/22 1214 12/27/22 0544  NA 137 138  K 4.2 3.7  CL 97* 100  CO2 28 29  GLUCOSE  101* 85  BUN 34* 32*  CREATININE 1.79* 1.69*  CALCIUM 8.3* 8.2*  MG  --  2.2   GFR: Estimated Creatinine Clearance: 34.9 mL/min (A) (by C-G formula based on SCr of 1.69 mg/dL (H)). Liver Function Tests: Recent Labs  Lab 12/26/22 1214 12/27/22 0544  AST 17 15  ALT 19 16  ALKPHOS 75 50  BILITOT 0.8 0.6  PROT 6.3* 5.5*  ALBUMIN 2.7* 3.1*   No results for input(s): "LIPASE", "AMYLASE" in the last 168 hours. No results for input(s): "AMMONIA" in the last 168 hours. Coagulation Profile: Recent Labs  Lab 12/27/22 0544  INR 2.2*   Cardiac Enzymes: No results for input(s): "CKTOTAL", "CKMB", "CKMBINDEX", "TROPONINI" in the last 168 hours. BNP (last 3 results) Recent Labs    12/31/21 1640  PROBNP 829.0*   HbA1C: No results for input(s): "HGBA1C" in the last 72 hours. CBG: No results for input(s): "GLUCAP" in the last 168 hours. Lipid Profile: No results for input(s): "CHOL", "HDL", "LDLCALC", "TRIG", "CHOLHDL", "LDLDIRECT" in the last 72 hours. Thyroid Function Tests: No results for input(s): "TSH", "T4TOTAL", "FREET4", "T3FREE", "THYROIDAB" in the last 72 hours. Anemia Panel: No results for input(s): "VITAMINB12", "FOLATE", "FERRITIN", "TIBC", "IRON", "RETICCTPCT" in the last 72 hours. Sepsis Labs: Recent Labs  Lab 12/26/22 1214 12/26/22 1408  LATICACIDVEN 1.4 0.9    Recent Results (from the past 240 hour(s))  Culture, body fluid w Gram Stain-bottle     Status: None (Preliminary result)   Collection Time: 12/26/22  9:15 AM   Specimen: Ascitic  Result Value Ref Range Status   Specimen Description ASCITIC  Final   Special Requests 10CC BOTTLES DRAWN AEROBIC AND ANAEROBIC  Final   Culture   Final    NO GROWTH < 24 HOURS Performed at [facility], [address] [place], [place] [address]    Report Status PENDING  Incomplete  Gram stain     Status: None   Collection Time: 12/26/22  9:15 AM   Specimen: Ascitic  Result Value Ref Range Status   Specimen  Description ASCITIC  Final   Special Requests NONE  Final   Gram Stain   Final    CYTOSPIN SMEAR WBC PRESENT,BOTH PMN AND MONONUCLEAR NO ORGANISMS SEEN Performed at [facility], [address] [place], [place] [address]    Report Status 12/26/2022 FINAL  Final  Blood culture (routine x 2)     Status: None (Preliminary result)   Collection Time: 12/26/22 11:57 AM   Specimen: BLOOD  Result Value Ref Range Status   Specimen Description BLOOD RIGHT ASSIST CONTROL  Final   Special Requests   Final    BOTTLES DRAWN AEROBIC AND ANAEROBIC Blood Culture adequate volume   Culture   Final    NO GROWTH < 24 HOURS Performed at [facility], [address] [place], [place] [address]    Report Status  PENDING  Incomplete  Blood culture (routine x 2)     Status: None (Preliminary result)   Collection Time: 12/26/22 12:14 PM   Specimen: BLOOD  Result Value Ref Range Status   Specimen Description BLOOD LEFT ASSIST CONTROL  Final   Special Requests   Final    BOTTLES DRAWN AEROBIC AND ANAEROBIC Blood Culture adequate volume   Culture   Final    NO GROWTH < 24 HOURS Performed at [facility], [address] [place], [place] [address]    Report Status PENDING  Incomplete         Radiology Studies: US Paracentesis  Result Date: 12/26/2022 INDICATION: Cirrhosis and ascites. EXAM: ULTRASOUND GUIDED PARACENTESIS MEDICATIONS: None. COMPLICATIONS: None immediate. PROCEDURE: Informed written consent was obtained from the patient after a discussion of the risks, benefits and alternatives to treatment. A timeout was performed prior to the initiation of the procedure. Initial ultrasound scanning demonstrates a large amount of ascites within the right lower abdominal quadrant. The right lower abdomen was prepped and draped in the usual sterile fashion. 1% lidocaine was used for local anesthesia. Following this, a 6 Fr Safe-T-Centesis catheter was introduced. An ultrasound image was saved for documentation  purposes. The paracentesis was performed. The catheter was removed and a dressing was applied. The patient tolerated the procedure well without immediate post procedural complication. FINDINGS: A total of approximately 4 L of yellow, turbid fluid was removed. Samples were sent to the laboratory as requested by the clinical team. IMPRESSION: Successful ultrasound-guided paracentesis yielding 4 liters of peritoneal fluid. Electronically Signed   By: Irish Lack [name]   On: 12/26/2022 10:20        Scheduled Meds:  amiodarone  200 mg Oral Daily   atorvastatin  80 mg Oral QHS   cholecalciferol  1,000 Units Oral Daily   doxazosin  4 mg Oral q AM   empagliflozin  10 mg Oral QAC breakfast   feeding supplement  1 Container Oral TID BM   finasteride  5 mg Oral QPM   furosemide  80 mg Oral BID   lactulose  20 g Oral BID   pantoprazole  40 mg Oral QAC breakfast   Rivaroxaban  15 mg Oral Q supper   sodium chloride flush  3 mL Intravenous Q12H   spironolactone  25 mg Oral Daily   Continuous Infusions:  sodium chloride     cefTRIAXone (ROCEPHIN)  IV       LOS: 1 day    Time spent: 35 minutes    Caleb Guderian Hoover Brunette, DO [facility]  If 7PM-7AM, please contact night-coverage [email or web address] 12/27/2022, 10:44 AM

## 2022-12-27 NOTE — Progress Notes (Signed)
   12/27/22 1409  TOC Brief Assessment  Insurance and Status Reviewed  Patient has primary care physician Yes  Home environment has been reviewed Home with Spouse  Prior level of function: nees assistance  Prior/Current Home Services No current home services  Social Determinants of Health Reivew SDOH reviewed no interventions necessary  Readmission risk has been reviewed Yes  Transition of care needs no transition of care needs at this time   TOC following, no needs identified. Needs in hospital ABX.

## 2022-12-28 DIAGNOSIS — K652 Spontaneous bacterial peritonitis: Secondary | ICD-10-CM | POA: Diagnosis not present

## 2022-12-28 LAB — MAGNESIUM: Magnesium: 2.2 mg/dL (ref 1.7–2.4)

## 2022-12-28 LAB — CBC
HCT: 30.5 % — ABNORMAL LOW (ref 39.0–52.0)
Hemoglobin: 9.3 g/dL — ABNORMAL LOW (ref 13.0–17.0)
MCH: 27.6 pg (ref 26.0–34.0)
MCHC: 30.5 g/dL (ref 30.0–36.0)
MCV: 90.5 fL (ref 80.0–100.0)
Platelets: 284 10*3/uL (ref 150–400)
RBC: 3.37 MIL/uL — ABNORMAL LOW (ref 4.22–5.81)
RDW: 16.3 % — ABNORMAL HIGH (ref 11.5–15.5)
WBC: 8 10*3/uL (ref 4.0–10.5)
nRBC: 0 % (ref 0.0–0.2)

## 2022-12-28 LAB — COMPREHENSIVE METABOLIC PANEL WITH GFR
ALT: 17 U/L (ref 0–44)
AST: 16 U/L (ref 15–41)
Albumin: 2.7 g/dL — ABNORMAL LOW (ref 3.5–5.0)
Alkaline Phosphatase: 58 U/L (ref 38–126)
Anion gap: 9 (ref 5–15)
BUN: 29 mg/dL — ABNORMAL HIGH (ref 8–23)
CO2: 28 mmol/L (ref 22–32)
Calcium: 8.1 mg/dL — ABNORMAL LOW (ref 8.9–10.3)
Chloride: 100 mmol/L (ref 98–111)
Creatinine, Ser: 1.63 mg/dL — ABNORMAL HIGH (ref 0.61–1.24)
GFR, Estimated: 42 mL/min — ABNORMAL LOW (ref >60)
Glucose, Bld: 99 mg/dL (ref 70–99)
Potassium: 3.7 mmol/L (ref 3.5–5.1)
Sodium: 137 mmol/L (ref 135–145)
Total Bilirubin: 0.6 mg/dL (ref 0.3–1.2)
Total Protein: 5.4 g/dL — ABNORMAL LOW (ref 6.5–8.1)

## 2022-12-28 MED ORDER — RIVAROXABAN 15 MG PO TABS: 15.0000 mg | ORAL_TABLET | Freq: Every day | ORAL | Status: DC

## 2022-12-28 MED ORDER — ALBUMIN HUMAN 25 % IV SOLN
50.0000 g | Freq: Three times a day (TID) | INTRAVENOUS | Status: AC
  Administered 2022-12-28 (×2): 50 g via INTRAVENOUS
  Filled 2022-12-28 (×2): qty 200

## 2022-12-28 NOTE — Plan of Care (Signed)
  Problem: Education: Goal: Knowledge of General Education information will improve Description: Including pain rating scale, medication(s)/side effects and non-pharmacologic comfort measures Outcome: Progressing   Problem: Health Behavior/Discharge Planning: Goal: Ability to manage health-related needs will improve Outcome: Progressing   Problem: Clinical Measurements: Goal: Ability to maintain clinical measurements within normal limits will improve Outcome: Progressing Goal: Diagnostic test results will improve Outcome: Progressing Goal: Cardiovascular complication will be avoided Outcome: Progressing   Problem: Activity: Goal: Risk for activity intolerance will decrease Outcome: Progressing   Problem: Nutrition: Goal: Adequate nutrition will be maintained Outcome: Progressing

## 2022-12-28 NOTE — Plan of Care (Signed)

## 2022-12-28 NOTE — Progress Notes (Signed)
PROGRESS NOTE    [name]  Wolf:[id number] DOB: 09-19-1940 DOA: 12/26/2022 PCP: Carylon Perches, MD   Brief Narrative:    Caleb Wolf is a 82 y.o. male with medical history significant for carotid stenosis, CKD stage IIIb, coronary artery disease post CABG, hypertension, GERD, dyslipidemia, atrial fibrillation, OSA, and CHF with EF 45% who presented to the ED with signs of SBP noted on paracentesis earlier.  He has empirically been started on IV antibiotics with further cultures pending.  GI following.  Plan is for paracentesis 7/29 and anticipated discharge after that.  Assessment & Plan:   Principal Problem:   SBP (spontaneous bacterial peritonitis) (HCC) Active Problems:   HTN (hypertension)   S/P CABG x 3   GERD (gastroesophageal reflux disease)   Persistent atrial fibrillation (HCC)   Absolute anemia   HLD (hyperlipidemia)   CAD (coronary artery disease)   Chronic kidney disease, stage 3b (HCC)   Hepatic cirrhosis (HCC)   Ascites  Assessment and Plan:  SBP -Continue on Rocephin per GI recommendations -Monitor cultures with no growth thus far -Administer albumin per GI today   Newly diagnosed cirrhosis with ascites -Status post 4 L paracentesis -Continue spironolactone and Lasix -Plan for repeat paracentesis 7/29 and further albumin to be given today   Chronic systolic CHF -EF 81% -Continue Lasix 80 mg twice daily and Jardiance -Continue spironolactone   CAD status post CABG 2015/dyslipidemia -Without anginal symptoms -Continue statin -Follows with Dr. Anne Fu   Hypertension -Continue lisinopril and monitor   Paroxysmal atrial fibrillation -Continue amiodarone 200 mg/day -No longer on Toprol   Chronic anticoagulation -Continue Xarelto for atrial fibrillation -Okay to stop Xarelto 2 days prior to paracentesis   CKD stage IIIb -At baseline creatinine which is normally near 1.9-2.6   BPH -Continue finasteride   DVT prophylaxis:Xarelto Code Status:  Full Family Communication: Wife at bedside 7/28 Disposition Plan: Continue IV antibiotics for SBP Status is: Inpatient Remains inpatient appropriate because: Need for IV antibiotics.  Consultants:  GI  Procedures:  None  Antimicrobials:  Anti-infectives (From admission, onward)    Start     Dose/Rate Route Frequency Ordered Stop   12/27/22 1000  cefTRIAXone (ROCEPHIN) 2 g in sodium chloride 0.9 % 100 mL IVPB        2 g 200 mL/hr over 30 Minutes Intravenous Every 24 hours 12/26/22 1512     12/26/22 1200  cefTRIAXone (ROCEPHIN) 2 g in sodium chloride 0.9 % 100 mL IVPB        2 g 200 mL/hr over 30 Minutes Intravenous  Once 12/26/22 1155 12/26/22 1407      Subjective: Patient seen and evaluated today with no new acute complaints or concerns. No acute concerns or events noted overnight.  He denies any abdominal pain.  Objective: Vitals:   12/27/22 0912 12/27/22 1403 12/27/22 2116 12/28/22 0451  BP:  (!) 119/54 (!) 123/50 (!) 129/47  Pulse: 62 (!) 49 (!) 49 (!) 44  Resp:  18 16 16   Temp:  98.1 F (36.7 C) 98.2 F (36.8 C) 97.8 F (36.6 C)  TempSrc:  Oral Oral Axillary  SpO2:  99% 99% 100%  Weight:      Height:        Intake/Output Summary (Last 24 hours) at 12/28/2022 1142 Last data filed at 12/28/2022 0900 Gross per 24 hour  Intake 223 ml  Output --  Net 223 ml   Filed Weights   12/26/22 1118 12/26/22 1122 12/26/22 2020  Weight: 83.9  kg 79 kg 79.7 kg    Examination:  General exam: Appears calm and comfortable  Respiratory system: Clear to auscultation. Respiratory effort normal. Cardiovascular system: S1 & S2 heard, RRR.  Gastrointestinal system: Abdomen is soft, moderately distended Central nervous system: Alert and awake Extremities: No edema Skin: No significant lesions noted Psychiatry: Flat affect.    Data Reviewed: I have personally reviewed following labs and imaging studies  CBC: Recent Labs  Lab 12/26/22 1214 12/27/22 0544 12/28/22 0514   WBC 7.8 7.4 8.0  NEUTROABS 6.4  --   --   HGB 10.6* 8.5* 9.3*  HCT 34.0* 27.9* 30.5*  MCV 90.4 90.6 90.5  PLT 314 261 284   Basic Metabolic Panel: Recent Labs  Lab 12/26/22 1214 12/27/22 0544 12/28/22 0514  NA 137 138 137  K 4.2 3.7 3.7  CL 97* 100 100  CO2 28 29 28   GLUCOSE 101* 85 99  BUN 34* 32* 29*  CREATININE 1.79* 1.69* 1.63*  CALCIUM 8.3* 8.2* 8.1*  MG  --  2.2 2.2   GFR: Estimated Creatinine Clearance: 36.1 mL/min (A) (by C-G formula based on SCr of 1.63 mg/dL (H)). Liver Function Tests: Recent Labs  Lab 12/26/22 1214 12/27/22 0544 12/28/22 0514  AST 17 15 16   ALT 19 16 17   ALKPHOS 75 50 58  BILITOT 0.8 0.6 0.6  PROT 6.3* 5.5* 5.4*  ALBUMIN 2.7* 3.1* 2.7*   No results for input(s): "LIPASE", "AMYLASE" in the last 168 hours. No results for input(s): "AMMONIA" in the last 168 hours. Coagulation Profile: Recent Labs  Lab 12/27/22 0544  INR 2.2*   Cardiac Enzymes: No results for input(s): "CKTOTAL", "CKMB", "CKMBINDEX", "TROPONINI" in the last 168 hours. BNP (last 3 results) Recent Labs    12/31/21 1640  PROBNP 829.0*   HbA1C: No results for input(s): "HGBA1C" in the last 72 hours. CBG: No results for input(s): "GLUCAP" in the last 168 hours. Lipid Profile: No results for input(s): "CHOL", "HDL", "LDLCALC", "TRIG", "CHOLHDL", "LDLDIRECT" in the last 72 hours. Thyroid Function Tests: No results for input(s): "TSH", "T4TOTAL", "FREET4", "T3FREE", "THYROIDAB" in the last 72 hours. Anemia Panel: No results for input(s): "VITAMINB12", "FOLATE", "FERRITIN", "TIBC", "IRON", "RETICCTPCT" in the last 72 hours. Sepsis Labs: Recent Labs  Lab 12/26/22 1214 12/26/22 1408  LATICACIDVEN 1.4 0.9    Recent Results (from the past 240 hour(s))  Culture, body fluid w Gram Stain-bottle     Status: None (Preliminary result)   Collection Time: 12/26/22  9:15 AM   Specimen: Ascitic  Result Value Ref Range Status   Specimen Description ASCITIC  Final    Special Requests 10CC BOTTLES DRAWN AEROBIC AND ANAEROBIC  Final   Culture   Final    NO GROWTH 2 DAYS Performed at [facility], [address], [place], [place] [address]    Report Status PENDING  Incomplete  Gram stain     Status: None   Collection Time: 12/26/22  9:15 AM   Specimen: Ascitic  Result Value Ref Range Status   Specimen Description ASCITIC  Final   Special Requests NONE  Final   Gram Stain   Final    CYTOSPIN SMEAR WBC PRESENT,BOTH PMN AND MONONUCLEAR NO ORGANISMS SEEN Performed at [facility], [address] [place], [place] [address]    Report Status 12/26/2022 FINAL  Final  Blood culture (routine x 2)     Status: None (Preliminary result)   Collection Time: 12/26/22 11:57 AM   Specimen: BLOOD  Result Value  Ref Range Status   Specimen Description BLOOD RIGHT ASSIST CONTROL  Final   Special Requests   Final    BOTTLES DRAWN AEROBIC AND ANAEROBIC Blood Culture adequate volume   Culture   Final    NO GROWTH 2 DAYS Performed at [facility], [address] [place], [place] [address]    Report Status PENDING  Incomplete  Blood culture (routine x 2)     Status: None (Preliminary result)   Collection Time: 12/26/22 12:14 PM   Specimen: BLOOD  Result Value Ref Range Status   Specimen Description BLOOD LEFT ASSIST CONTROL  Final   Special Requests   Final    BOTTLES DRAWN AEROBIC AND ANAEROBIC Blood Culture adequate volume   Culture   Final    NO GROWTH 2 DAYS Performed at [facility], [address] [place], [place] [address]    Report Status PENDING  Incomplete         Radiology Studies: No results found.      Scheduled Meds:  amiodarone  200 mg Oral Daily   atorvastatin  80 mg Oral QHS   cholecalciferol  1,000 Units Oral Daily   doxazosin  4 mg Oral q AM   empagliflozin  10 mg Oral QAC breakfast   feeding supplement  1 Container Oral TID BM   finasteride  5 mg Oral QPM   furosemide  80 mg Oral BID   lactulose  20 g Oral BID   pantoprazole   40 mg Oral QAC breakfast   [START ON 12/29/2022] Rivaroxaban  15 mg Oral Q supper   sodium chloride flush  3 mL Intravenous Q12H   spironolactone  25 mg Oral Daily   Continuous Infusions:  sodium chloride     albumin human 50 g (12/28/22 0838)   cefTRIAXone (ROCEPHIN)  IV 2 g (12/28/22 0801)     LOS: 2 days    Time spent: 35 minutes    Keil Pickering Hoover Brunette, DO [facility]  If 7PM-7AM, please contact night-coverage [email or web address] 12/28/2022, 11:42 AM

## 2022-12-28 NOTE — Progress Notes (Signed)
Gastroenterology & Hepatology   Interval History: Patient is seen with wife bedside.  He is comfortable and denies any abdominal pain nausea or vomiting.  Patient wife had several questions about SBP and further care management.  Inpatient Medications:  Current Facility-Administered Medications:    0.9 %  sodium chloride infusion, 250 mL, Intravenous, PRN, Sherryll Burger, Pratik D, DO   acetaminophen (TYLENOL) tablet 650 mg, 650 mg, Oral, Q6H PRN **OR** acetaminophen (TYLENOL) suppository 650 mg, 650 mg, Rectal, Q6H PRN, Sherryll Burger, Pratik D, DO   amiodarone (PACERONE) tablet 200 mg, 200 mg, Oral, Daily, Sherryll Burger, Pratik D, DO, 200 mg at 12/27/22 1610   atorvastatin (LIPITOR) tablet 80 mg, 80 mg, Oral, QHS, Sherryll Burger, Pratik D, DO, 80 mg at 12/27/22 2104   cefTRIAXone (ROCEPHIN) 2 g in sodium chloride 0.9 % 100 mL IVPB, 2 g, Intravenous, Q24H, Shah, Pratik D, DO, Last Rate: 200 mL/hr at 12/27/22 1045, 2 g at 12/27/22 1045   cholecalciferol (VITAMIN D3) 25 MCG (1000 UNIT) tablet 1,000 Units, 1,000 Units, Oral, Daily, Sherryll Burger, Pratik D, DO, 1,000 Units at 12/27/22 9604   colchicine tablet 0.6 mg, 0.6 mg, Oral, Daily PRN, Sherryll Burger, Pratik D, DO   doxazosin (CARDURA) tablet 4 mg, 4 mg, Oral, q AM, Sherryll Burger, Pratik D, DO, 4 mg at 12/27/22 5409   empagliflozin (JARDIANCE) tablet 10 mg, 10 mg, Oral, QAC breakfast, Sherryll Burger, Pratik D, DO, 10 mg at 12/27/22 8119   feeding supplement (BOOST HIGH PROTEIN) liquid 237 mL, 1 Container, Oral, TID BM, Shah, Pratik D, DO, 237 mL at 12/27/22 1478   finasteride (PROSCAR) tablet 5 mg, 5 mg, Oral, QPM, Shah, Pratik D, DO, 5 mg at 12/27/22 1713   fluticasone (FLONASE) 50 MCG/ACT nasal spray 1 spray, 1 spray, Each Nare, Daily PRN, Sherryll Burger, Pratik D, DO   furosemide (LASIX) tablet 80 mg, 80 mg, Oral, BID, Sherryll Burger, Pratik D, DO, 80 mg at 12/27/22 1713   lactulose (CHRONULAC) 10 GM/15ML solution 20 g, 20 g, Oral, BID, Sherryll Burger, Pratik D, DO, 20 g at 12/27/22 2105   ondansetron (ZOFRAN) tablet 4 mg, 4 mg, Oral, Q6H PRN  **OR** ondansetron (ZOFRAN) injection 4 mg, 4 mg, Intravenous, Q6H PRN, Sherryll Burger, Pratik D, DO   pantoprazole (PROTONIX) EC tablet 40 mg, 40 mg, Oral, QAC breakfast, Sherryll Burger, Pratik D, DO, 40 mg at 12/27/22 2956   Rivaroxaban (XARELTO) tablet 15 mg, 15 mg, Oral, Q supper, Sherryll Burger, Pratik D, DO, 15 mg at 12/27/22 1713   sodium chloride flush (NS) 0.9 % injection 3 mL, 3 mL, Intravenous, Q12H, Shah, Pratik D, DO, 3 mL at 12/27/22 1043   sodium chloride flush (NS) 0.9 % injection 3 mL, 3 mL, Intravenous, PRN, Sherryll Burger, Pratik D, DO   spironolactone (ALDACTONE) tablet 25 mg, 25 mg, Oral, Daily, Sherryll Burger, Pratik D, DO, 25 mg at 12/27/22 2130   I/O    Intake/Output Summary (Last 24 hours) at 12/28/2022 0052 Last data filed at 12/27/2022 0318 Gross per 24 hour  Intake 400 ml  Output --  Net 400 ml     Physical Exam: Temp:  [97.7 F (36.5 C)-98.5 F (36.9 C)] 98.2 F (36.8 C) (07/27 2116) Pulse Rate:  [48-62] 49 (07/27 2116) Resp:  [16-18] 16 (07/27 2116) BP: (112-123)/(45-54) 123/50 (07/27 2116) SpO2:  [94 %-99 %] 99 % (07/27 2116)  Temp (24hrs), Avg:98.1 F (36.7 C), Min:97.7 F (36.5 C), Max:98.5 F (36.9 C)  GENERAL: NAD HEENT: Head is normocephalic and atraumatic. EOMI are intact. Mouth is well hydrated and without  lesions. NECK: Supple. No masses LUNGS: Clear to auscultation. No presence of rhonchi/wheezing/rales. Adequate chest expansion HEART: RRR, normal s1 and s2. ABDOMEN: Soft, nontender, no guarding, no peritoneal signs, and mildly distended. BS +.   Laboratory Data: CBC:     Component Value Date/Time   WBC 7.4 12/27/2022 0544   RBC 3.08 (L) 12/27/2022 0544   HGB 8.5 (L) 12/27/2022 0544   HGB 12.3 (L) 11/16/2020 1445   HCT 27.9 (L) 12/27/2022 0544   HCT 38.4 11/16/2020 1445   PLT 261 12/27/2022 0544   PLT 198 11/16/2020 1445   MCV 90.6 12/27/2022 0544   MCV 97 11/16/2020 1445   MCH 27.6 12/27/2022 0544   MCHC 30.5 12/27/2022 0544   RDW 16.4 (H) 12/27/2022 0544   RDW 12.8  11/16/2020 1445   LYMPHSABS 0.6 (L) 12/26/2022 1214   LYMPHSABS 1.4 11/16/2020 1445   MONOABS 0.7 12/26/2022 1214   EOSABS 0.1 12/26/2022 1214   EOSABS 0.2 11/16/2020 1445   BASOSABS 0.0 12/26/2022 1214   BASOSABS 0.0 11/16/2020 1445   COAG:  Lab Results  Component Value Date   INR 2.2 (H) 12/27/2022   INR 1.3 (H) 11/18/2022   INR 2.2 (H) 08/15/2019    BMP:     Latest Ref Rng & Units 12/27/2022    5:44 AM 12/26/2022   12:14 PM 11/18/2022   10:01 AM  BMP  Glucose 70 - 99 mg/dL 85  161  096   BUN 8 - 23 mg/dL 32  34  26   Creatinine 0.61 - 1.24 mg/dL 0.45  4.09  8.11   BUN/Creat Ratio 6 - 22 (calc)   12   Sodium 135 - 145 mmol/L 138  137  140   Potassium 3.5 - 5.1 mmol/L 3.7  4.2  4.0   Chloride 98 - 111 mmol/L 100  97  99   CO2 22 - 32 mmol/L 29  28  35   Calcium 8.9 - 10.3 mg/dL 8.2  8.3  8.6     HEPATIC:     Latest Ref Rng & Units 12/27/2022    5:44 AM 12/26/2022   12:14 PM 11/18/2022   10:01 AM  Hepatic Function  Total Protein 6.5 - 8.1 g/dL 5.5  6.3  5.6   Albumin 3.5 - 5.0 g/dL 3.1  2.7    AST 15 - 41 U/L 15  17  13    ALT 0 - 44 U/L 16  19  11    Alk Phosphatase 38 - 126 U/L 50  75    Total Bilirubin 0.3 - 1.2 mg/dL 0.6  0.8  0.4     CARDIAC:  Lab Results  Component Value Date   CKTOTAL 63 05/28/2014   CKMB 257.8 (HH) 05/23/2014   TROPONINI 0.03 (HH) 04/29/2017      Imaging: I personally reviewed and interpreted the available labs, imaging and endoscopic files.   Assessment/Plan:   Caleb Wolf is a 82 y.o. year old male with history of AAA, carotid stenosis, CKD stage IIIb, CAD s/p CABG, HTN, GERD, HLD, A-fib, OSA, CHF with EF 45%, and decompensated cardiac cirrhosis with recurrent ascites who was sent after outpatient therapeutic paracentesis found to have SBP .   #Decompensated Cirrhosis  MELD 3.0 :19 MELD-NA: 20     #Eitology :  Cardiac cirrhosis . Total ascitic protein >2.5 (4.2), although ??SAAG <1.1  Reports  drank alcohol, quit 20 years ago    Negative ASMA, AMA, IgG. Ferrtin 136 , Tsat :  18   #SBP  Paracentesis with total 1025 white cells with 29% PMN    No growth in peritoneal fluid hence this is culture-negative neutrophilic ascites (CNNA)  # Hepatic encephalopathy Patient alert oriented x 3 without any asterixis, no signs of any hepatic encephalopathy.  Given severe constipation patient was started on lactulose previously   # Ascites   Currently on Lasix 80 mg twice daily and spironolactone 25 mg .  Given CKD and creatinine of 2.1 diuretics was not increased last clinic visit.  Patient was going under workup for TIPS procedure.  Patient is going under weekly paracentesis.  As echo next week, has a cardiology appointment next week for clearance for the procedure .     # Esophageal varices - Last EGD 2019 without varices. PLT>150   # HCC screening Recent CT without any focal lesion.  aFP 2.8.  Will need every 6 months screening with ultrasound and AFP   Recommendations:  - Another dose of  25% albumin 1gm/kg today max 100gm)-ordered -Ceftriaxone 2gm q 24  -Patient has a scheduled paracentesis on Monday , Hold Xarelto for paracentesis for now  -May be discharged with PO Cefpodoxime fro SBP treatment on Monday after paracentesis  -Should send repeat albumin and total protein in next paracentesis as SAAG does not correlate with portal HTN   --would need to be on life long prophylaxis upon discharge with cirpofloxacin 500mg  daily or PO bactrim DS every day upon discharge - Reduce salt intake to <2 g per day - Can take Tylenol max of 2 g per day (650 mg q8h) for pain - Avoid NSAIDs for pain - Titrate lactulose to achieve 2-3 Bms per day  - Avoid opiates or benzodiazepines    Vista Lawman, MD Gastroenterology and Hepatology [facility]   This chart has been completed using [facility] Dictation software, and while attempts have been made to ensure accuracy , certain words and  phrases may not be transcribed as intended

## 2022-12-29 ENCOUNTER — Encounter (HOSPITAL_COMMUNITY): Payer: Self-pay | Admitting: Internal Medicine

## 2022-12-29 ENCOUNTER — Telehealth: Payer: Self-pay | Admitting: Cardiology

## 2022-12-29 ENCOUNTER — Telehealth: Payer: Self-pay | Admitting: Gastroenterology

## 2022-12-29 ENCOUNTER — Inpatient Hospital Stay (HOSPITAL_COMMUNITY): Payer: Medicare HMO

## 2022-12-29 ENCOUNTER — Ambulatory Visit (HOSPITAL_COMMUNITY): Admission: RE | Admit: 2022-12-29 | Payer: Medicare HMO | Source: Ambulatory Visit

## 2022-12-29 DIAGNOSIS — K652 Spontaneous bacterial peritonitis: Secondary | ICD-10-CM | POA: Diagnosis not present

## 2022-12-29 DIAGNOSIS — R188 Other ascites: Secondary | ICD-10-CM | POA: Diagnosis not present

## 2022-12-29 MED ORDER — CEFPODOXIME PROXETIL 200 MG PO TABS: 200.0000 mg | ORAL_TABLET | Freq: Two times a day (BID) | ORAL | 0 refills | Status: AC

## 2022-12-29 MED ORDER — ALBUMIN HUMAN 25 % IV SOLN
INTRAVENOUS | Status: AC
  Filled 2022-12-29: qty 100

## 2022-12-29 MED ORDER — ALBUMIN HUMAN 25 % IV SOLN
0.0000 g | Freq: Once | INTRAVENOUS | Status: AC
  Administered 2022-12-29: 25 g via INTRAVENOUS

## 2022-12-29 MED ORDER — CIPROFLOXACIN HCL 500 MG PO TABS: 500.0000 mg | ORAL_TABLET | Freq: Every day | ORAL | 0 refills | Status: DC

## 2022-12-29 NOTE — Plan of Care (Signed)
  Problem: Education: Goal: Knowledge of General Education information will improve Description: Including pain rating scale, medication(s)/side effects and non-pharmacologic comfort measures Outcome: Progressing   Problem: Health Behavior/Discharge Planning: Goal: Ability to manage health-related needs will improve Outcome: Progressing   Problem: Clinical Measurements: Goal: Ability to maintain clinical measurements within normal limits will improve Outcome: Progressing   Problem: Activity: Goal: Risk for activity intolerance will decrease Outcome: Progressing   Problem: Nutrition: Goal: Adequate nutrition will be maintained Outcome: Progressing   Problem: Coping: Goal: Level of anxiety will decrease Outcome: Progressing   Problem: Elimination: Goal: Will not experience complications related to bowel motility Outcome: Progressing   Problem: Safety: Goal: Ability to remain free from injury will improve Outcome: Progressing   

## 2022-12-29 NOTE — Progress Notes (Signed)
Patient tolerated left sided paracentesis procedure and 25G of IV albumin well today and 4.7 Liters of cloudy yellow ascites removed. Patient verbalized understanding of post procedure and transported back to inpatient bed assignment via stretcher at this time with no acute distress noted.

## 2022-12-29 NOTE — Progress Notes (Signed)
Pt upset when the bed alarm went off while he was trying to go use the bathroom. Educated patient on importance of calling before going to bathroom. Wife at bedside would like bed alarm off while she is in the room. Educated Pt  and wife to continue calling when getting OOB. Both agreed to keep the bed alarm on for safety

## 2022-12-29 NOTE — Telephone Encounter (Signed)
Concern is QTc prolongation. Not many great options. The 2 most common SBP prophylaxis options are Bactrim or Cipro. Some evidence for doxy, which would have a little bit lower risk of QTc.  Dr. Tasia Catchings saw pt in hospital Dr. Anne Fu can you weigh in?

## 2022-12-29 NOTE — Telephone Encounter (Signed)
Pt c/o medication issue:  1. Name of Medication:  Recommendations for Outpatient Follow-up:  Follow up with PCP in 1-2 weeks Continue on Vantin as prescribed for 2 more days and then switch to ciprofloxacin daily thereafter for SBP for prophylaxis Okay to resume home Xarelto Continue on other home medications as prior  ciprofloxacin (CIPRO) 500 MG tablet   cefpodoxime (VANTIN) 200 MG tablet   amiodarone (PACERONE) 200 MG tablet   2. How are you currently taking this medication (dosage and times per day)? As written   3. Are you having a reaction (difficulty breathing--STAT)? No   4. What is your medication issue? Pt called in stating pharmacy is concerned that these three medications have an interaction. They need to verify that taking these medications together is okay. Please advise.

## 2022-12-29 NOTE — Discharge Summary (Signed)
Physician Discharge Summary  Caleb Wolf NFA:[id number] DOB: Dec 04, 1940 DOA: 12/26/2022  PCP: Carylon Perches, MD  Admit date: 12/26/2022  Discharge date: 12/29/2022  Admitted From:Home  Disposition:  Home  Recommendations for Outpatient Follow-up:  Follow up with PCP in 1-2 weeks Continue on Vantin as prescribed for 2 more days and then switch to ciprofloxacin daily thereafter for SBP for prophylaxis Okay to resume home Xarelto Continue on other home medications as prior  Home Health: None  Equipment/Devices: None  Discharge Condition:Stable  CODE STATUS: Full  Diet recommendation: Heart Healthy  Brief/Interim Summary: Caleb Wolf is a 82 y.o. male with medical history significant for carotid stenosis, CKD stage IIIb, coronary artery disease post CABG, hypertension, GERD, dyslipidemia, atrial fibrillation, OSA, and CHF with EF 45% who presented to the ED with signs of SBP noted on paracentesis earlier.  He was started on empiric IV antibiotics with Rocephin with no growth noted on cultures.  GI recommended repeat paracentesis on 7/29 and 4.7 L of fluid were removed.  He is now in stable condition for discharge with oral antibiotics as prescribed and will also remain on prophylactic ciprofloxacin thereafter.  He has close follow-up for consideration of TIPS procedure soon.  No other acute events or concerns noted.  Discharge Diagnoses:  Principal Problem:   SBP (spontaneous bacterial peritonitis) (HCC) Active Problems:   HTN (hypertension)   S/P CABG x 3   GERD (gastroesophageal reflux disease)   Persistent atrial fibrillation (HCC)   Absolute anemia   HLD (hyperlipidemia)   CAD (coronary artery disease)   Chronic kidney disease, stage 3b (HCC)   Hepatic cirrhosis (HCC)   Ascites  Principal discharge diagnosis: SBP in the setting of newly diagnosed cirrhosis with ascites requiring paracentesis.  Discharge Instructions  Discharge Instructions     Diet - low sodium heart  healthy   Complete by: As directed    Increase activity slowly   Complete by: As directed       Allergies as of 12/29/2022       Reactions   Codeine Sulfate Nausea Only        Medication List     TAKE these medications    acetaminophen 500 MG tablet Commonly known as: TYLENOL Take 1,000 mg by mouth every 6 (six) hours as needed for moderate pain.   amiodarone 200 MG tablet Commonly known as: PACERONE Take 1 tablet (200 mg total) by mouth daily.   atorvastatin 80 MG tablet Commonly known as: LIPITOR TAKE 1 TABLET BY MOUTH EVERY DAY IN THE EVENING   cefpodoxime 200 MG tablet Commonly known as: VANTIN Take 1 tablet (200 mg total) by mouth 2 (two) times daily for 2 days.   cholecalciferol 25 MCG (1000 UNIT) tablet Commonly known as: VITAMIN D3 Take 1,000 Units by mouth daily.   ciprofloxacin 500 MG tablet Commonly known as: Cipro Take 1 tablet (500 mg total) by mouth daily at 6 (six) AM. Start taking on: January 01, 2023   cyanocobalamin 1000 MCG tablet Commonly known as: VITAMIN B12 Take 1,000 mcg by mouth as directed. 2-3 TIMES WEEKLY PER PT'S WIFE   doxazosin 4 MG tablet Commonly known as: CARDURA Take 4 mg by mouth in the morning.   empagliflozin 10 MG Tabs tablet Commonly known as: JARDIANCE Take 1 tablet (10 mg total) by mouth daily before breakfast.   feeding supplement Liqd Take 1 Container by mouth at bedtime. One shake before bedtime.   finasteride 5 MG tablet Commonly known as:  PROSCAR Take 5 mg by mouth every evening.   fluticasone 50 MCG/ACT nasal spray Commonly known as: FLONASE Place 1 spray into both nostrils daily as needed for allergies or rhinitis.   furosemide 80 MG tablet Commonly known as: LASIX Take 1 tablet (80 mg total) by mouth 2 (two) times daily.   lactulose 10 GM/15ML solution Commonly known as: CHRONULAC Take 30 mLs (20 g total) by mouth 2 (two) times daily. Can increase to twice a day depending on constipation  improvement   nitroGLYCERIN 0.4 MG SL tablet Commonly known as: NITROSTAT Place 1 tablet (0.4 mg total) under the tongue every 5 (five) minutes x 3 doses as needed for chest pain.   pantoprazole 40 MG tablet Commonly known as: PROTONIX Take 1 tablet (40 mg total) by mouth daily before breakfast.   Rivaroxaban 15 MG Tabs tablet Commonly known as: XARELTO Take 1 tablet (15 mg total) by mouth daily with supper.   spironolactone 25 MG tablet Commonly known as: Aldactone Take 1 tablet (25 mg total) by mouth daily.        Follow-up Information     Carylon Perches, MD. Schedule an appointment as soon as possible for a visit in 1 week(s).   Specialty: Internal Medicine Contact information: [address] [place] [address] [phone]                Allergies  Allergen Reactions   Codeine Sulfate Nausea Only    Consultations: GI   Procedures/Studies: US Paracentesis  Result Date: 12/29/2022 INDICATION: Recurrent ascites EXAM: ULTRASOUND GUIDED  PARACENTESIS MEDICATIONS: None. COMPLICATIONS: None immediate. PROCEDURE: Informed written consent was obtained from the patient after a discussion of the risks, benefits and alternatives to treatment. A timeout was performed prior to the initiation of the procedure. Initial ultrasound scanning demonstrates a large amount of ascites within the left lower abdominal quadrant. The right lower abdomen was prepped and draped in the usual sterile fashion. 1% lidocaine was used for local anesthesia. Following this, a 19 gauge, 7-cm, Yueh catheter was introduced. An ultrasound image was saved for documentation purposes. The paracentesis was performed. The catheter was removed and a dressing was applied. The patient tolerated the procedure well without immediate post procedural complication. FINDINGS: A total of approximately 4.7 L of straw-colored fluid was removed. IMPRESSION: Successful ultrasound-guided paracentesis yielding liters  of peritoneal fluid. Electronically Signed   By: Acquanetta Belling [name]   On: 12/29/2022 09:59   CT ANGIO ABD/PELVIS BRTO  Result Date: 12/26/2022 CLINICAL DATA:  Cirrhosis, portal hypertension. EXAM: CTA ABDOMEN AND PELVIS WITHOUT AND WITH CONTRAST TECHNIQUE: Multidetector CT imaging of the abdomen and pelvis was performed using the standard protocol during bolus administration of intravenous contrast. Multiplanar reconstructed images and MIPs were obtained and reviewed to evaluate the vascular anatomy. RADIATION DOSE REDUCTION: This exam was performed according to the departmental dose-optimization program which includes automated exposure control, adjustment of the mA and/or kV according to patient size and/or use of iterative reconstruction technique. CONTRAST:  75mL ISOVUE-370 IOPAMIDOL (ISOVUE-370) INJECTION 76% COMPARISON:  None Available. FINDINGS: VASCULAR Aorta: No evidence of aneurysm or dissection. Advanced calcified atherosclerotic plaque throughout. Celiac: Not visualized SMA: Not visualized Renals: Heavily calcified atherosclerotic plaque likely results in critical stenosis of the right main renal artery. The left main renal artery is patent without significant stenosis or evidence of FMD. IMA: Patent without evidence of aneurysm, dissection, vasculitis or significant stenosis. Inflow: Bulky calcification at the aortic bifurcation extending into both iliac arteries  results in moderate to high-grade stenosis on the right and mild stenosis on the left. There is then mild poststenotic dilation of the right common iliac artery. Stenosis is present at the origin of both hypogastric arteries. The external iliac arteries are relatively spared from disease. Proximal Outflow: Bulky calcified plaque in the common femoral arteries bilaterally. No occlusion. The visualized proximal superficial and profunda femoral arteries are normal. Veins: Patent hepatic and portal veins. No evidence of portal venous thrombosis.  The splenic vein is patent. No significant esophageal or gastric varices. No gastro renal shunt. Review of the MIP images confirms the above findings. NON-VASCULAR Lower chest: Cardiomegaly. Small right and trace left pleural effusions. Mild dependent atelectasis. Hepatobiliary: Small cirrhotic liver. No arterially enhancing lesion to suggest hepatocellular carcinoma. Small gallstones present in the gallbladder lumen. No gallbladder distension or biliary ductal dilation. Pancreas: Unremarkable. No pancreatic ductal dilatation or surrounding inflammatory changes. Spleen: No splenic injury or perisplenic hematoma. Adrenals/Urinary Tract: Renal size discrepancy. The right kidney is smaller than the left likely due to chronic ischemia. No hydronephrosis or nephrolithiasis. Circumscribed simple water attenuation cysts bilaterally. No imaging follow-up is recommended. Stomach/Bowel: Stomach is within normal limits. Appendix appears normal. No evidence of bowel wall thickening, distention, or inflammatory changes. Lymphatic: No suspicious lymphadenopathy. Reproductive: Prostate is unremarkable. Other: Large volume ascites. Mild mesenteric edema. Umbilical hernia containing ascitic fluid. Musculoskeletal: No acute fracture or aggressive appearing lytic or blastic osseous lesion. 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 IMPRESSION: VASCULAR 1. Patent hepatic and portal veins. No evidence of portal venous thrombosis. 2. No gastro renal shunt, gastroesophageal varices or other significant portosystemic collaterals. 3. 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 4.  Aortic Atherosclerosis (ICD10-I70.0). NON-VASCULAR 1. Hepatic cirrhosis with large volume ascites. 2. No evidence of hepatocellular carcinoma. 3. Cardiomegaly. 4. Umbilical hernia containing ascitic fluid. 5. Multilevel  degenerative disc disease. Signed, Sterling Big, MD, RPVI [facility] Electronically Signed   By: Malachy Moan [name]   On: 12/26/2022 10:47   US Paracentesis  Result Date: 12/26/2022 INDICATION: Cirrhosis and ascites. EXAM: ULTRASOUND GUIDED PARACENTESIS MEDICATIONS: None. COMPLICATIONS: None immediate. PROCEDURE: Informed written consent was obtained from the patient after a discussion of the risks, benefits and alternatives to treatment. A timeout was performed prior to the initiation of the procedure. Initial ultrasound scanning demonstrates a large amount of ascites within the right lower abdominal quadrant. The right lower abdomen was prepped and draped in the usual sterile fashion. 1% lidocaine was used for local anesthesia. Following this, a 6 Fr Safe-T-Centesis catheter was introduced. An ultrasound image was saved for documentation purposes. The paracentesis was performed. The catheter was removed and a dressing was applied. The patient tolerated the procedure well without immediate post procedural complication. FINDINGS: A total of approximately 4 L of yellow, turbid fluid was removed. Samples were sent to the laboratory as requested by the clinical team. IMPRESSION: Successful ultrasound-guided paracentesis yielding 4 liters of peritoneal fluid. Electronically Signed   By: Irish Lack [name]   On: 12/26/2022 10:20   IR Radiologist Eval & Mgmt  Result Date: 12/01/2022 EXAM: NEW PATIENT OFFICE VISIT CHIEF COMPLAINT: See below HISTORY OF PRESENT ILLNESS: See below REVIEW OF SYSTEMS: See below PHYSICAL EXAMINATION: See below ASSESSMENT AND PLAN: Please refer to completed note in the electronic medical record on [facility] Roanna Banning, MD [facility]  [facility] Electronically Signed   By: Roanna Banning [name]   On: 12/01/2022 10:05     Discharge Exam: Vitals:   12/29/22 0940  12/29/22 0950  BP: (!) 130/50 (!) 132/52  Pulse: (!) 52 (!) 51  Resp: 18 16  Temp:    SpO2: 99% 98%   Vitals:   12/29/22 0920 12/29/22 0930 12/29/22 0940 12/29/22 0950  BP: (!) 135/56 (!) 137/49 (!) 130/50 (!) 132/52  Pulse: (!) 50 (!) 51 (!) 52 (!) 51  Resp: 16 18 18 16   Temp:      TempSrc:      SpO2: 99% 99% 99% 98%  Weight:      Height:        General: Pt is alert, awake, not in acute distress Cardiovascular: RRR, S1/S2 +, no rubs, no gallops Respiratory: CTA bilaterally, no wheezing, no rhonchi Abdominal: Soft, NT, ND, bowel sounds + Extremities: no edema, no cyanosis    The results of significant diagnostics from this hospitalization (including imaging, microbiology, ancillary and laboratory) are listed below for reference.     Microbiology: Recent Results (from the past 240 hour(s))  Culture, body fluid w Gram Stain-bottle     Status: None (Preliminary result)   Collection Time: 12/26/22  9:15 AM   Specimen: Ascitic  Result Value Ref Range Status   Specimen Description ASCITIC  Final   Special Requests 10CC BOTTLES DRAWN AEROBIC AND ANAEROBIC  Final   Culture   Final    NO GROWTH 3 DAYS Performed at [facility], [address], [place], [place] [address]    Report Status PENDING  Incomplete  Gram stain     Status: None   Collection Time: 12/26/22  9:15 AM   Specimen: Ascitic  Result Value Ref Range Status   Specimen Description ASCITIC  Final   Special Requests NONE  Final   Gram Stain   Final    CYTOSPIN SMEAR WBC PRESENT,BOTH PMN AND MONONUCLEAR NO ORGANISMS SEEN Performed at [facility], [address] [place], [place] [address]    Report Status 12/26/2022 FINAL  Final  Blood culture (routine x 2)     Status: None (Preliminary result)   Collection Time: 12/26/22 11:57 AM   Specimen: BLOOD  Result Value Ref Range Status   Specimen Description BLOOD RIGHT ASSIST CONTROL  Final   Special Requests   Final    BOTTLES DRAWN AEROBIC AND ANAEROBIC Blood  Culture adequate volume   Culture   Final    NO GROWTH 3 DAYS Performed at [facility], [address], [place], [place] [address]    Report Status PENDING  Incomplete  Blood culture (routine x 2)     Status: None (Preliminary result)   Collection Time: 12/26/22 12:14 PM   Specimen: BLOOD  Result Value Ref Range Status   Specimen Description BLOOD LEFT ASSIST CONTROL  Final   Special Requests   Final    BOTTLES DRAWN AEROBIC AND ANAEROBIC Blood Culture adequate volume   Culture   Final    NO GROWTH 3 DAYS Performed at [facility], [address], [place], [place] [address]    Report Status PENDING  Incomplete     Labs: BNP (last 3 results) Recent Labs    03/06/22 0904 03/21/22 1253  BNP 510.5* 847.0*   Basic Metabolic Panel: Recent Labs  Lab 12/26/22 1214 12/27/22 0544 12/28/22 0514  NA 137 138 137  K 4.2 3.7 3.7  CL 97* 100 100  CO2  28 29 28   GLUCOSE 101* 85 99  BUN 34* 32* 29*  CREATININE 1.79* 1.69* 1.63*  CALCIUM 8.3* 8.2* 8.1*  MG  --  2.2 2.2   Liver Function Tests: Recent Labs  Lab 12/26/22 1214 12/27/22 0544 12/28/22 0514  AST 17 15 16   ALT 19 16 17   ALKPHOS 75 50 58  BILITOT 0.8 0.6 0.6  PROT 6.3* 5.5* 5.4*  ALBUMIN 2.7* 3.1* 2.7*   No results for input(s): "LIPASE", "AMYLASE" in the last 168 hours. No results for input(s): "AMMONIA" in the last 168 hours. CBC: Recent Labs  Lab 12/26/22 1214 12/27/22 0544 12/28/22 0514  WBC 7.8 7.4 8.0  NEUTROABS 6.4  --   --   HGB 10.6* 8.5* 9.3*  HCT 34.0* 27.9* 30.5*  MCV 90.4 90.6 90.5  PLT 314 261 284   Cardiac Enzymes: No results for input(s): "CKTOTAL", "CKMB", "CKMBINDEX", "TROPONINI" in the last 168 hours. BNP: Invalid input(s): "POCBNP" CBG: No results for input(s): "GLUCAP" in the last 168 hours. D-Dimer No results for input(s): "DDIMER" in the last 72 hours. Hgb A1c No results for input(s): "HGBA1C" in the last 72 hours. Lipid Profile No results for input(s): "CHOL", "HDL",  "LDLCALC", "TRIG", "CHOLHDL", "LDLDIRECT" in the last 72 hours. Thyroid function studies No results for input(s): "TSH", "T4TOTAL", "T3FREE", "THYROIDAB" in the last 72 hours.  Invalid input(s): "FREET3" Anemia work up No results for input(s): "VITAMINB12", "FOLATE", "FERRITIN", "TIBC", "IRON", "RETICCTPCT" in the last 72 hours. Urinalysis    Component Value Date/Time   COLORURINE YELLOW 02/16/2014 1333   APPEARANCEUR CLEAR 02/16/2014 1333   LABSPEC 1.012 02/16/2014 1333   PHURINE 5.5 02/16/2014 1333   GLUCOSEU NEGATIVE 02/16/2014 1333   HGBUR NEGATIVE 02/16/2014 1333   BILIRUBINUR NEGATIVE 02/16/2014 1333   KETONESUR NEGATIVE 02/16/2014 1333   PROTEINUR NEGATIVE 02/16/2014 1333   UROBILINOGEN 0.2 02/16/2014 1333   NITRITE NEGATIVE 02/16/2014 1333   LEUKOCYTESUR NEGATIVE 02/16/2014 1333   Sepsis Labs Recent Labs  Lab 12/26/22 1214 12/27/22 0544 12/28/22 0514  WBC 7.8 7.4 8.0   Microbiology Recent Results (from the past 240 hour(s))  Culture, body fluid w Gram Stain-bottle     Status: None (Preliminary result)   Collection Time: 12/26/22  9:15 AM   Specimen: Ascitic  Result Value Ref Range Status   Specimen Description ASCITIC  Final   Special Requests 10CC BOTTLES DRAWN AEROBIC AND ANAEROBIC  Final   Culture   Final    NO GROWTH 3 DAYS Performed at [facility], [address] [place], [place] [address]    Report Status PENDING  Incomplete  Gram stain     Status: None   Collection Time: 12/26/22  9:15 AM   Specimen: Ascitic  Result Value Ref Range Status   Specimen Description ASCITIC  Final   Special Requests NONE  Final   Gram Stain   Final    CYTOSPIN SMEAR WBC PRESENT,BOTH PMN AND MONONUCLEAR NO ORGANISMS SEEN Performed at [facility], [address] [place], [place] [address]    Report Status 12/26/2022 FINAL  Final  Blood culture (routine x 2)     Status: None (Preliminary result)   Collection Time: 12/26/22 11:57 AM   Specimen: BLOOD  Result Value  Ref Range Status   Specimen Description BLOOD RIGHT ASSIST CONTROL  Final   Special Requests   Final    BOTTLES DRAWN AEROBIC AND ANAEROBIC Blood Culture adequate volume   Culture   Final    NO GROWTH 3  DAYS Performed at [facility], [address] [place], [place] [address]    Report Status PENDING  Incomplete  Blood culture (routine x 2)     Status: None (Preliminary result)   Collection Time: 12/26/22 12:14 PM   Specimen: BLOOD  Result Value Ref Range Status   Specimen Description BLOOD LEFT ASSIST CONTROL  Final   Special Requests   Final    BOTTLES DRAWN AEROBIC AND ANAEROBIC Blood Culture adequate volume   Culture   Final    NO GROWTH 3 DAYS Performed at [facility], [address], [place], [place] [address]    Report Status PENDING  Incomplete     Time coordinating discharge: 35 minutes  SIGNED:   Erick Blinks, DO [facility] 12/29/2022, 10:38 AM  If 7PM-7AM, please contact night-coverage [email or web address]

## 2022-12-29 NOTE — Telephone Encounter (Signed)
Mitzie: patient needs hospital follow-up with [facility] or Dr. Levon Hedger. Thanks!

## 2022-12-29 NOTE — Progress Notes (Signed)
Patient seen at bedside s/p paracentesis. He is stable for discharge home today. No abdominal pain, confusion, mental status changes. He will be taking Cipro daily ongoing for SBP prophylaxis. We will arrange outpatient follow-up.  Gelene Mink, PhD, ANP-BC [facility]

## 2022-12-30 NOTE — Telephone Encounter (Signed)
Patient's wife called to follow up on yesterday's call.

## 2022-12-30 NOTE — Telephone Encounter (Signed)
Spoke with wife and scheduled EKG for 01/08/2023 here at [facility] office.  [facility] had advised wife per her report they would not fill RX without approval.  Called [facility] [phone][facility] and advised Dr Anne Fu has reviewed is pt is OK to take.  Advised pt will be having EKG 01/08/23 to follow QT intervals.

## 2022-12-30 NOTE — Telephone Encounter (Signed)
Left message for wife to call back to scheduled nurse visit for EKG due 1 week after starting Cipro -per Dr Anne Fu.

## 2022-12-31 ENCOUNTER — Ambulatory Visit (INDEPENDENT_AMBULATORY_CARE_PROVIDER_SITE_OTHER): Payer: Medicare HMO

## 2022-12-31 DIAGNOSIS — Z01810 Encounter for preprocedural cardiovascular examination: Secondary | ICD-10-CM | POA: Diagnosis not present

## 2022-12-31 DIAGNOSIS — K766 Portal hypertension: Secondary | ICD-10-CM | POA: Diagnosis not present

## 2022-12-31 LAB — ECHOCARDIOGRAM COMPLETE
Area-P 1/2: 2.13 cm2
Calc EF: 47.4 %
S' Lateral: 3.29 cm
Single Plane A2C EF: 45.9 %
Single Plane A4C EF: 47.4 %

## 2023-01-01 DIAGNOSIS — N200 Calculus of kidney: Secondary | ICD-10-CM | POA: Diagnosis not present

## 2023-01-01 DIAGNOSIS — N401 Enlarged prostate with lower urinary tract symptoms: Secondary | ICD-10-CM | POA: Diagnosis not present

## 2023-01-01 DIAGNOSIS — E291 Testicular hypofunction: Secondary | ICD-10-CM | POA: Diagnosis not present

## 2023-01-01 DIAGNOSIS — N529 Male erectile dysfunction, unspecified: Secondary | ICD-10-CM | POA: Diagnosis not present

## 2023-01-01 DIAGNOSIS — N312 Flaccid neuropathic bladder, not elsewhere classified: Secondary | ICD-10-CM | POA: Diagnosis not present

## 2023-01-02 ENCOUNTER — Ambulatory Visit: Payer: Medicare HMO | Attending: Cardiovascular Disease | Admitting: Student

## 2023-01-02 DIAGNOSIS — Z01810 Encounter for preprocedural cardiovascular examination: Secondary | ICD-10-CM | POA: Diagnosis not present

## 2023-01-02 DIAGNOSIS — I5030 Unspecified diastolic (congestive) heart failure: Secondary | ICD-10-CM | POA: Diagnosis not present

## 2023-01-02 NOTE — Progress Notes (Signed)
Virtual Visit via Telephone Note   Because of Caleb Wolf's co-morbid illnesses, he is at least at moderate risk for complications without adequate follow up.  This format is felt to be most appropriate for this patient at this time.  The patient did not have access to video technology/had technical difficulties with video requiring transitioning to audio format only (telephone).  All issues noted in this document were discussed and addressed.  No physical exam could be performed with this format.  Please refer to the patient's chart for his consent to telehealth for [facility].  Evaluation Performed:  Preoperative cardiovascular risk assessment _____________   Date:  01/02/2023   Patient ID:  Caleb Wolf, DOB 06-26-1940, MRN [id number] Patient Location:  Home Provider location:   Office  Primary Care Provider:  Carylon Perches, MD Primary Cardiologist:  Donato Schultz, MD  Chief Complaint / Patient Profile   82 y.o. y/o male with a h/o  CAD s/p CABG x 3, chronic HFmrEF, PAF s/p afib ablation February 2019 and July 2022 and DCCV January 2023 on anticoagulation, carotid artery disease s/p CEA September 2015, hypertension, hyperlipidemia, OSA on CPAP, GERD, CKD stage IIIb who is pending TIPS procedure by Dr. Milford Cage and presents today for telephonic preoperative cardiovascular risk assessment.  History of Present Illness    Caleb Wolf is a 82 y.o. male who presents via audio/video conferencing for a telehealth visit today.  Pt was last seen in cardiology clinic on 10/20/2022 by Dr. Anne Fu.  At that time Caleb Wolf was stable from a cardiac perspective.  The patient is now pending procedure as outlined above. Since his last visit, he is doing well. Patient denies shortness of breath or dyspnea on exertion. No chest pain, pressure, or tightness. Denies orthopnea or PND. Lower extremity edema is greatly improved and well managed with Lasix. No palpitations.    Past Medical History     Past Medical History:  Diagnosis Date   AAA (abdominal aortic aneurysm) (HCC)    Arthritis    Basal cell carcinoma    Carotid stenosis    Chronic kidney disease, stage 3b (HCC) 12/13/2021   Coronary artery disease    Multvessel s/p CABG 2015   Enlarged prostate    Essential hypertension    GERD (gastroesophageal reflux disease)    Gout    History of colon polyps    History of kidney stones    Hyperlipidemia    Lumbar disc disease    Persistent atrial fibrillation (HCC)    Sleep apnea    CPAP   Past Surgical History:  Procedure Laterality Date   ABLATION OF DYSRHYTHMIC FOCUS  07/28/2017   ATRIAL FIBRILLATION ABLATION N/A 07/28/2017   Procedure: ATRIAL FIBRILLATION ABLATION;  Surgeon: Hillis Range, MD;  Location: [facility] INVASIVE CV LAB;  Service: Cardiovascular;  Laterality: N/A;   ATRIAL FIBRILLATION ABLATION N/A 12/04/2020   Procedure: ATRIAL FIBRILLATION ABLATION;  Surgeon: Hillis Range, MD;  Location: [facility] INVASIVE CV LAB;  Service: Cardiovascular;  Laterality: N/A;   BACK SURGERY     CARDIAC CATHETERIZATION  05/22/2014   Procedure: IABP INSERTION;  Surgeon: Marykay Lex, MD;  Location: [facility] CATH LAB;  Service: Cardiovascular;;   CARDIOVERSION N/A 04/29/2017   Procedure: CARDIOVERSION;  Surgeon: Jonelle Sidle, MD;  Location: [facility] ENDO SUITE;  Service: Cardiovascular;  Laterality: N/A;   CARDIOVERSION N/A 08/13/2017   Procedure: CARDIOVERSION;  Surgeon: Chrystie Nose, MD;  Location: [facility] ENDOSCOPY;  Service: Cardiovascular;  Laterality: N/A;   CARDIOVERSION N/A 08/15/2019   Procedure: CARDIOVERSION;  Surgeon: Lars Masson, MD;  Location: [facility] ENDOSCOPY;  Service: Cardiovascular;  Laterality: N/A;   CARDIOVERSION N/A 03/09/2020   Procedure: CARDIOVERSION;  Surgeon: Jodelle Red, MD;  Location: [facility] ENDOSCOPY;  Service: Cardiovascular;  Laterality: N/A;   CARDIOVERSION N/A 07/06/2020   Procedure: CARDIOVERSION;  Surgeon: Thurmon Fair, MD;  Location: [facility] ENDOSCOPY;   Service: Cardiovascular;  Laterality: N/A;   CARDIOVERSION N/A 01/18/2021   Procedure: CARDIOVERSION;  Surgeon: Chilton Si, MD;  Location: [facility] ENDOSCOPY;  Service: Cardiovascular;  Laterality: N/A;   CARDIOVERSION N/A 06/17/2021   Procedure: CARDIOVERSION;  Surgeon: Sande Rives, MD;  Location: [facility] ENDOSCOPY;  Service: Cardiovascular;  Laterality: N/A;   Cataract surgery Right    COLONOSCOPY     COLONOSCOPY N/A 12/30/2017   Procedure: COLONOSCOPY;  Surgeon: Malissa Hippo, MD;  Location: [facility]O [facility];  Service: Endoscopy;  Laterality: N/A;   CORONARY ARTERY BYPASS GRAFT N/A 05/22/2014   Procedure: CORONARY ARTERY BYPASS GRAFTING (CABG) times three using left internal mammary and right saphenous vein.;  Surgeon: Loreli Slot, MD;  Location: [facility] OR;  Service: Open Heart Surgery;  Laterality: N/A;   ENDARTERECTOMY Left 02/17/2014   Procedure: ENDARTERECTOMY CAROTID WITH PATCH ANGIOPLASTY;  Surgeon: Pryor Ochoa, MD;  Location: [facility] OR;  Service: Vascular;  Laterality: Left;   ESOPHAGEAL DILATION N/A 08/22/2015   Procedure: ESOPHAGEAL DILATION;  Surgeon: Malissa Hippo, MD;  Location: [facility];  Service: Endoscopy;  Laterality: N/A;   ESOPHAGOGASTRODUODENOSCOPY N/A 08/22/2015   Procedure: ESOPHAGOGASTRODUODENOSCOPY (EGD);  Surgeon: Malissa Hippo, MD;  Location: [facility]O [facility];  Service: Endoscopy;  Laterality: N/A;  12:45 - moved to 1:55 - Ann notified pt   ESOPHAGOGASTRODUODENOSCOPY N/A 12/30/2017   Procedure: ESOPHAGOGASTRODUODENOSCOPY (EGD);  Surgeon: Malissa Hippo, MD;  Location: [facility];  Service: Endoscopy;  Laterality: N/A;  200   EYE SURGERY     cataract extraction (right) with repair macular tear , with IOL     right   FRACTURE SURGERY     bilateral wrist fractures- one ORIF   IR RADIOLOGIST EVAL & MGMT  12/01/2022   JOINT REPLACEMENT  2011   left knee   LEFT HEART CATHETERIZATION WITH CORONARY ANGIOGRAM N/A 05/22/2014   Procedure: LEFT HEART  CATHETERIZATION WITH CORONARY ANGIOGRAM;  Surgeon: Marykay Lex, MD;  Location: [facility] CATH LAB;  Service: Cardiovascular;  Laterality: N/A;   LITHOTRIPSY     PARS PLANA VITRECTOMY W/ REPAIR OF MACULAR HOLE     RHINOPLASTY     TEE WITHOUT CARDIOVERSION N/A 04/29/2017   Procedure: TRANSESOPHAGEAL ECHOCARDIOGRAM (TEE) WITH PROPOFOL;  Surgeon: Jonelle Sidle, MD;  Location: [facility]O [facility];  Service: Cardiovascular;  Laterality: N/A;   TONSILLECTOMY     TOTAL HIP ARTHROPLASTY  12/08/2011   Procedure: TOTAL HIP ARTHROPLASTY;  Surgeon: Loanne Drilling, MD;  Location: [facility] ORS;  Service: Orthopedics;  Laterality: Right;   UPPER GI ENDOSCOPY  12/18/2015   Procedure: UPPER GI ENDOSCOPY;  Surgeon: Axel Filler, MD;  Location: [facility] ORS;  Service: General;;   WRIST SURGERY Right 74yrs ago   WRIST SURGERY Left     Allergies  Allergies  Allergen Reactions   Codeine Sulfate Nausea Only    Home Medications    Prior to Admission medications   Medication Sig Start Date End Date Taking? Authorizing Provider  acetaminophen (TYLENOL) 500 MG tablet Take 1,000 mg by mouth every 6 (six) hours  as needed for moderate pain.    [provider]  amiodarone (PACERONE) 200 MG tablet Take 1 tablet (200 mg total) by mouth daily. 10/24/22   Jake Bathe, MD  atorvastatin (LIPITOR) 80 MG tablet TAKE 1 TABLET BY MOUTH EVERY DAY IN THE EVENING 04/03/22   Jake Bathe, MD  cholecalciferol (VITAMIN D3) 25 MCG (1000 UNIT) tablet Take 1,000 Units by mouth daily.    [provider]  ciprofloxacin (CIPRO) 500 MG tablet Take 1 tablet (500 mg total) by mouth daily at 6 (six) AM. 01/01/23 01/31/23  Sherryll Burger, Pratik D, DO  cyanocobalamin (VITAMIN B12) 1000 MCG tablet Take 1,000 mcg by mouth as directed. 2-3 TIMES WEEKLY PER PT'S WIFE    [provider]  doxazosin (CARDURA) 4 MG tablet Take 4 mg by mouth in the morning. 07/06/17   [provider]  empagliflozin (JARDIANCE) 10 MG TABS tablet Take 1 tablet  (10 mg total) by mouth daily before breakfast. 06/11/22   Jake Bathe, MD  feeding supplement (BOOST HIGH PROTEIN) LIQD Take 1 Container by mouth at bedtime. One shake before bedtime.    [provider]  finasteride (PROSCAR) 5 MG tablet Take 5 mg by mouth every evening.     [provider]  fluticasone (FLONASE) 50 MCG/ACT nasal spray Place 1 spray into both nostrils daily as needed for allergies or rhinitis.    [provider]  furosemide (LASIX) 80 MG tablet Take 1 tablet (80 mg total) by mouth 2 (two) times daily. 06/11/22   Jake Bathe, MD  lactulose (CHRONULAC) 10 GM/15ML solution Take 30 mLs (20 g total) by mouth 2 (two) times daily. Can increase to twice a day depending on constipation improvement 12/22/22   Carlan, Chelsea L, NP  nitroGLYCERIN (NITROSTAT) 0.4 MG SL tablet Place 1 tablet (0.4 mg total) under the tongue every 5 (five) minutes x 3 doses as needed for chest pain. 06/04/20   Allred, Fayrene Fearing, MD  pantoprazole (PROTONIX) 40 MG tablet Take 1 tablet (40 mg total) by mouth daily before breakfast. 02/19/16   Rehman, Joline Maxcy, MD  Rivaroxaban (XARELTO) 15 MG TABS tablet Take 1 tablet (15 mg total) by mouth daily with supper. 12/16/21   Catarina Hartshorn, MD  spironolactone (ALDACTONE) 25 MG tablet Take 1 tablet (25 mg total) by mouth daily. 11/10/22   Dolores Frame, MD    Physical Exam    Vital Signs:  Caleb Wolf does not have vital signs available for review today.  Given telephonic nature of communication, physical exam is limited. AAOx3. NAD. Normal affect.  Speech and respirations are unlabored.  Accessory Clinical Findings    None  Assessment & Plan    Primary Cardiologist: Donato Schultz, MD  Preoperative cardiovascular risk assessment. TIPS procedure with Dr. Milford Cage.   Chart reviewed as part of pre-operative protocol coverage. According to the RCRI, patient has a 6.6% risk of MACE. Patient reports activity equivalent to >4.0 METS (rides  recumbent bike 2-3 times a week, walks a lot).   Given past medical history and time since last visit, based on ACC/AHA guidelines, Caleb Wolf would be at acceptable risk for the planned procedure without further cardiovascular testing.   Patient was advised that if he develops new symptoms prior to surgery to contact our office to arrange a follow-up appointment.  he verbalized understanding.  Per Pharm D, patient may hold Xarelto 2 days prior to procedure.  I will route this recommendation to the  requesting party via Epic fax function.  Please call with questions.  Time:   Today, I have spent 5 minutes with the patient with telehealth technology discussing medical history, symptoms, and management plan.     Caleb Levering, NP  01/02/2023, 9:52 AM

## 2023-01-04 DIAGNOSIS — G4733 Obstructive sleep apnea (adult) (pediatric): Secondary | ICD-10-CM | POA: Diagnosis not present

## 2023-01-07 DIAGNOSIS — G4733 Obstructive sleep apnea (adult) (pediatric): Secondary | ICD-10-CM | POA: Diagnosis not present

## 2023-01-08 ENCOUNTER — Other Ambulatory Visit (INDEPENDENT_AMBULATORY_CARE_PROVIDER_SITE_OTHER): Payer: Self-pay | Admitting: Gastroenterology

## 2023-01-08 ENCOUNTER — Telehealth (INDEPENDENT_AMBULATORY_CARE_PROVIDER_SITE_OTHER): Payer: Self-pay | Admitting: Gastroenterology

## 2023-01-08 ENCOUNTER — Ambulatory Visit: Payer: Medicare HMO | Attending: Cardiology

## 2023-01-08 ENCOUNTER — Encounter (INDEPENDENT_AMBULATORY_CARE_PROVIDER_SITE_OTHER): Payer: Self-pay

## 2023-01-08 VITALS — BP 124/62 | HR 52 | Resp 16 | Ht 69.0 in | Wt 173.6 lb

## 2023-01-08 DIAGNOSIS — Z79899 Other long term (current) drug therapy: Secondary | ICD-10-CM | POA: Diagnosis not present

## 2023-01-08 DIAGNOSIS — K652 Spontaneous bacterial peritonitis: Secondary | ICD-10-CM

## 2023-01-08 DIAGNOSIS — R9431 Abnormal electrocardiogram [ECG] [EKG]: Secondary | ICD-10-CM | POA: Diagnosis not present

## 2023-01-08 DIAGNOSIS — K7460 Unspecified cirrhosis of liver: Secondary | ICD-10-CM

## 2023-01-08 DIAGNOSIS — R188 Other ascites: Secondary | ICD-10-CM

## 2023-01-08 MED ORDER — SULFAMETHOXAZOLE-TRIMETHOPRIM 800-160 MG PO TABS
1.0000 | ORAL_TABLET | Freq: Every day | ORAL | 3 refills | Status: DC
Start: 2023-01-08 — End: 2023-03-16

## 2023-01-08 NOTE — Telephone Encounter (Signed)
My Chart Message sent 01/08/2023 at 1:59 pm stating.   Per Dr. Levon Hedger please send him an order for a BMP (blood work) in 1 week around 01/15/2023. We have placed lab orders in the system for you to have done at [facility] around 01/15/2023. [facility] is located at [address] right beside our office. I will place the order up at the front desk at our office so you can pick it up the day you want to have your labs drawn and then just take it next door to [facility]. Let us know if you have any questions. Thanks

## 2023-01-08 NOTE — Telephone Encounter (Addendum)
I was reached by the cardiology office seeing Mr. Chalfant.  He is on amiodarone and he was advised to stop ciprofloxacin due to the risk of prolonging his QTc.  Upon further discussion with the cardiology office, it was considered that Bactrim DS daily would be a good option for SBP prophylaxis.  I discussed this with the patient's family who understood.  Medication was sent to his pharmacy.  He will need to have repeat labs in 1 week to assess renal function and potassium.  Crystal, please send him an order for a BMP in 1 week.  I also discussed with the family member that she should be aware of signs of hypoglycemia as this may occur when using Jardiance in combination with Bactrim.  He should follow-up with his PCP closely.  Tanya, please put an order for therapeutic paracentesis as needed with interventional radiology.

## 2023-01-08 NOTE — Telephone Encounter (Signed)
Max of 5 Liters to be drawn (should receive albumin 50 g if so), has to have cell count, Gram stain, culture checked Thanks

## 2023-01-08 NOTE — Progress Notes (Signed)
   Nurse Visit   Date of Encounter: 01/08/2023 ID: Caleb Wolf, DOB 1941-03-19, MRN [id number]  PCP:  Carylon Perches, MD   [facility] Providers Cardiologist:  Donato Schultz, MD Electrophysiologist:  Hillis Range, MD (Inactive)      Visit Details   VS:  BP 124/62 (BP Location: Left Arm, Patient Position: Sitting, Cuff Size: Normal)   Pulse (!) 52 Comment: HR on EKG  Resp 16   Ht 5\' 9"  (1.753 m)   Wt 173 lb 9.6 oz (78.7 kg)   SpO2 97%   BMI 25.64 kg/m  , BMI Body mass index is 25.64 kg/m.  Wt Readings from Last 3 Encounters:  01/08/23 173 lb 9.6 oz (78.7 kg)  12/26/22 175 lb 11.3 oz (79.7 kg)  12/22/22 185 lb 6.4 oz (84.1 kg)     Reason for visit: EKG Performed today: Vitals, EKG, Provider consulted:Traci Turner, MD, and Education Changes (medications, testing, etc.) : Discontinued Ciprofloxacin  Length of Visit: 20 minutes    Medications Adjustments/Labs and Tests Ordered: Orders Placed This Encounter  Procedures   EKG 12-Lead   Patient is to discontinue ciprofloxacin per Dr Mayford Knife due to prolonged Qt interval. Patient states feeling well. No signs of concerns or questions at this time. Sent staff message to GI providers [facility] NP and Dolores Frame MD) to make aware of ciprofloxacin discontinuation in office today and for recommendation on a different antibiotic for SBP prophylaxis     Signed, Festus Holts, RN  01/08/2023 11:39 AM

## 2023-01-08 NOTE — Telephone Encounter (Signed)
Orders for paracentesis placed. My chart message sent to patient

## 2023-01-08 NOTE — Telephone Encounter (Signed)
Would you like this pt to be referred to IR for paracentesis or would you like me to just put orders in? Please advise. Thank you!

## 2023-01-08 NOTE — Telephone Encounter (Signed)
Only orders would be fine for PRN therapeutic paracentesis Thanks

## 2023-01-08 NOTE — Telephone Encounter (Signed)
Would you like any labs to be drawn from paracentesis? How much fluid to be taken off? Please advise. Thank you!!!

## 2023-01-14 ENCOUNTER — Other Ambulatory Visit: Payer: Self-pay

## 2023-01-14 ENCOUNTER — Inpatient Hospital Stay (HOSPITAL_COMMUNITY)
Admission: EM | Admit: 2023-01-14 | Discharge: 2023-01-18 | DRG: 372 | Disposition: A | Discharge status: Home / Self Care | Payer: Medicare HMO | Attending: Internal Medicine | Admitting: Internal Medicine

## 2023-01-14 ENCOUNTER — Ambulatory Visit (HOSPITAL_COMMUNITY)
Admission: RE | Admit: 2023-01-14 | Discharge: 2023-01-14 | Disposition: A | Discharge status: Home / Self Care | Payer: Medicare HMO | Source: Ambulatory Visit | Attending: Gastroenterology | Admitting: Gastroenterology

## 2023-01-14 ENCOUNTER — Encounter (HOSPITAL_COMMUNITY): Payer: Self-pay

## 2023-01-14 ENCOUNTER — Encounter (HOSPITAL_COMMUNITY): Payer: Self-pay | Admitting: Emergency Medicine

## 2023-01-14 VITALS — BP 148/55 | HR 59 | Temp 98.0°F | Resp 18

## 2023-01-14 DIAGNOSIS — Z85828 Personal history of other malignant neoplasm of skin: Secondary | ICD-10-CM

## 2023-01-14 DIAGNOSIS — K219 Gastro-esophageal reflux disease without esophagitis: Secondary | ICD-10-CM | POA: Diagnosis present

## 2023-01-14 DIAGNOSIS — I4819 Other persistent atrial fibrillation: Secondary | ICD-10-CM | POA: Diagnosis present

## 2023-01-14 DIAGNOSIS — M109 Gout, unspecified: Secondary | ICD-10-CM | POA: Diagnosis present

## 2023-01-14 DIAGNOSIS — R001 Bradycardia, unspecified: Secondary | ICD-10-CM | POA: Diagnosis not present

## 2023-01-14 DIAGNOSIS — K761 Chronic passive congestion of liver: Secondary | ICD-10-CM | POA: Diagnosis present

## 2023-01-14 DIAGNOSIS — Z66 Do not resuscitate: Secondary | ICD-10-CM | POA: Diagnosis not present

## 2023-01-14 DIAGNOSIS — I10 Essential (primary) hypertension: Secondary | ICD-10-CM | POA: Diagnosis present

## 2023-01-14 DIAGNOSIS — R188 Other ascites: Secondary | ICD-10-CM | POA: Diagnosis present

## 2023-01-14 DIAGNOSIS — E785 Hyperlipidemia, unspecified: Secondary | ICD-10-CM | POA: Diagnosis present

## 2023-01-14 DIAGNOSIS — N400 Enlarged prostate without lower urinary tract symptoms: Secondary | ICD-10-CM | POA: Diagnosis present

## 2023-01-14 DIAGNOSIS — I2510 Atherosclerotic heart disease of native coronary artery without angina pectoris: Secondary | ICD-10-CM | POA: Diagnosis not present

## 2023-01-14 DIAGNOSIS — Z7984 Long term (current) use of oral hypoglycemic drugs: Secondary | ICD-10-CM

## 2023-01-14 DIAGNOSIS — K5900 Constipation, unspecified: Secondary | ICD-10-CM | POA: Diagnosis present

## 2023-01-14 DIAGNOSIS — K652 Spontaneous bacterial peritonitis: Secondary | ICD-10-CM | POA: Diagnosis not present

## 2023-01-14 DIAGNOSIS — N1832 Chronic kidney disease, stage 3b: Secondary | ICD-10-CM | POA: Diagnosis present

## 2023-01-14 DIAGNOSIS — N179 Acute kidney failure, unspecified: Secondary | ICD-10-CM | POA: Diagnosis not present

## 2023-01-14 DIAGNOSIS — Z951 Presence of aortocoronary bypass graft: Secondary | ICD-10-CM

## 2023-01-14 DIAGNOSIS — Z8249 Family history of ischemic heart disease and other diseases of the circulatory system: Secondary | ICD-10-CM

## 2023-01-14 DIAGNOSIS — Z7901 Long term (current) use of anticoagulants: Secondary | ICD-10-CM

## 2023-01-14 DIAGNOSIS — J9621 Acute and chronic respiratory failure with hypoxia: Secondary | ICD-10-CM | POA: Diagnosis not present

## 2023-01-14 DIAGNOSIS — I714 Abdominal aortic aneurysm, without rupture, unspecified: Secondary | ICD-10-CM | POA: Diagnosis not present

## 2023-01-14 DIAGNOSIS — Z79899 Other long term (current) drug therapy: Secondary | ICD-10-CM | POA: Diagnosis not present

## 2023-01-14 DIAGNOSIS — I5022 Chronic systolic (congestive) heart failure: Secondary | ICD-10-CM | POA: Diagnosis present

## 2023-01-14 DIAGNOSIS — G4733 Obstructive sleep apnea (adult) (pediatric): Secondary | ICD-10-CM | POA: Diagnosis present

## 2023-01-14 DIAGNOSIS — Z96641 Presence of right artificial hip joint: Secondary | ICD-10-CM | POA: Diagnosis not present

## 2023-01-14 DIAGNOSIS — I130 Hypertensive heart and chronic kidney disease with heart failure and stage 1 through stage 4 chronic kidney disease, or unspecified chronic kidney disease: Secondary | ICD-10-CM | POA: Diagnosis not present

## 2023-01-14 DIAGNOSIS — K429 Umbilical hernia without obstruction or gangrene: Secondary | ICD-10-CM | POA: Diagnosis present

## 2023-01-14 DIAGNOSIS — M1990 Unspecified osteoarthritis, unspecified site: Secondary | ICD-10-CM | POA: Diagnosis present

## 2023-01-14 DIAGNOSIS — K7460 Unspecified cirrhosis of liver: Secondary | ICD-10-CM | POA: Diagnosis not present

## 2023-01-14 DIAGNOSIS — Z87891 Personal history of nicotine dependence: Secondary | ICD-10-CM

## 2023-01-14 DIAGNOSIS — I4891 Unspecified atrial fibrillation: Secondary | ICD-10-CM | POA: Diagnosis not present

## 2023-01-14 DIAGNOSIS — E7800 Pure hypercholesterolemia, unspecified: Secondary | ICD-10-CM | POA: Diagnosis not present

## 2023-01-14 LAB — COMPREHENSIVE METABOLIC PANEL
ALT: 33 U/L (ref 0–44)
AST: 25 U/L (ref 15–41)
Albumin: 3.3 g/dL — ABNORMAL LOW (ref 3.5–5.0)
Alkaline Phosphatase: 63 U/L (ref 38–126)
Anion gap: 9 (ref 5–15)
BUN: 39 mg/dL — ABNORMAL HIGH (ref 8–23)
CO2: 27 mmol/L (ref 22–32)
Calcium: 8 mg/dL — ABNORMAL LOW (ref 8.9–10.3)
Chloride: 98 mmol/L (ref 98–111)
Creatinine, Ser: 2.03 mg/dL — ABNORMAL HIGH (ref 0.61–1.24)
GFR, Estimated: 33 mL/min — ABNORMAL LOW (ref >60)
Glucose, Bld: 112 mg/dL — ABNORMAL HIGH (ref 70–99)
Potassium: 3.6 mmol/L (ref 3.5–5.1)
Sodium: 134 mmol/L — ABNORMAL LOW (ref 135–145)
Total Bilirubin: 0.4 mg/dL (ref 0.3–1.2)
Total Protein: 6 g/dL — ABNORMAL LOW (ref 6.5–8.1)

## 2023-01-14 LAB — URINALYSIS, ROUTINE W REFLEX MICROSCOPIC
Bilirubin Urine: NEGATIVE
Glucose, UA: >=500 mg/dL — AB
Hgb urine dipstick: NEGATIVE
Ketones, ur: NEGATIVE mg/dL
Leukocytes,Ua: NEGATIVE
Nitrite: NEGATIVE
Protein, ur: NEGATIVE mg/dL
Specific Gravity, Urine: 1.009 (ref 1.005–1.030)
pH: 5 (ref 5.0–8.0)

## 2023-01-14 LAB — GRAM STAIN: Gram Stain: NONE SEEN

## 2023-01-14 LAB — CBC
HCT: 31.3 % — ABNORMAL LOW (ref 39.0–52.0)
Hemoglobin: 9.8 g/dL — ABNORMAL LOW (ref 13.0–17.0)
MCH: 28.3 pg (ref 26.0–34.0)
MCHC: 31.3 g/dL (ref 30.0–36.0)
MCV: 90.5 fL (ref 80.0–100.0)
Platelets: 285 10*3/uL (ref 150–400)
RBC: 3.46 MIL/uL — ABNORMAL LOW (ref 4.22–5.81)
RDW: 16.4 % — ABNORMAL HIGH (ref 11.5–15.5)
WBC: 7.9 10*3/uL (ref 4.0–10.5)
nRBC: 0 % (ref 0.0–0.2)

## 2023-01-14 LAB — BODY FLUID CELL COUNT WITH DIFFERENTIAL
Eos, Fluid: 0 %
Lymphs, Fluid: 53 %
Monocyte-Macrophage-Serous Fluid: 17 % — ABNORMAL LOW (ref 50–90)
Neutrophil Count, Fluid: 30 % — ABNORMAL HIGH (ref 0–25)
Total Nucleated Cell Count, Fluid: 1202 cu mm — ABNORMAL HIGH (ref 0–1000)

## 2023-01-14 MED ORDER — DOXAZOSIN MESYLATE 2 MG PO TABS
4.0000 mg | ORAL_TABLET | Freq: Every day | ORAL | Status: DC
  Administered 2023-01-16 – 2023-01-18 (×3): 4 mg via ORAL
  Filled 2023-01-14 (×2): qty 1
  Filled 2023-01-14 (×3): qty 2

## 2023-01-14 MED ORDER — ATORVASTATIN CALCIUM 40 MG PO TABS
80.0000 mg | ORAL_TABLET | Freq: Every day | ORAL | Status: DC
  Administered 2023-01-14 – 2023-01-17 (×4): 80 mg via ORAL
  Filled 2023-01-14 (×4): qty 2

## 2023-01-14 MED ORDER — AMIODARONE HCL 200 MG PO TABS
200.0000 mg | ORAL_TABLET | Freq: Every day | ORAL | Status: DC
  Administered 2023-01-15 – 2023-01-18 (×4): 200 mg via ORAL
  Filled 2023-01-14 (×4): qty 1

## 2023-01-14 MED ORDER — ACETAMINOPHEN 650 MG RE SUPP: 650.0000 mg | Freq: Four times a day (QID) | RECTAL | Status: DC | PRN

## 2023-01-14 MED ORDER — SPIRONOLACTONE 25 MG PO TABS
25.0000 mg | ORAL_TABLET | Freq: Every day | ORAL | Status: DC
  Administered 2023-01-15 – 2023-01-18 (×4): 25 mg via ORAL
  Filled 2023-01-14 (×4): qty 1

## 2023-01-14 MED ORDER — ACETAMINOPHEN 325 MG PO TABS: 650.0000 mg | ORAL_TABLET | Freq: Four times a day (QID) | ORAL | Status: DC | PRN

## 2023-01-14 MED ORDER — OXYCODONE HCL 5 MG PO TABS: 5.0000 mg | ORAL_TABLET | ORAL | Status: DC | PRN

## 2023-01-14 MED ORDER — ALBUMIN HUMAN 25 % IV SOLN
50.0000 g | Freq: Once | INTRAVENOUS | Status: AC
  Administered 2023-01-14: 50 g via INTRAVENOUS

## 2023-01-14 MED ORDER — ALBUMIN HUMAN 25 % IV SOLN
INTRAVENOUS | Status: AC
  Filled 2023-01-14: qty 200

## 2023-01-14 MED ORDER — PANTOPRAZOLE SODIUM 40 MG PO TBEC
40.0000 mg | DELAYED_RELEASE_TABLET | Freq: Every day | ORAL | Status: DC
  Administered 2023-01-15 – 2023-01-18 (×4): 40 mg via ORAL
  Filled 2023-01-14 (×4): qty 1

## 2023-01-14 MED ORDER — RIVAROXABAN 15 MG PO TABS
15.0000 mg | ORAL_TABLET | Freq: Every day | ORAL | Status: DC
  Administered 2023-01-15 – 2023-01-17 (×3): 15 mg via ORAL
  Filled 2023-01-14 (×3): qty 1

## 2023-01-14 MED ORDER — FINASTERIDE 5 MG PO TABS
5.0000 mg | ORAL_TABLET | Freq: Every evening | ORAL | Status: DC
  Administered 2023-01-14 – 2023-01-17 (×4): 5 mg via ORAL
  Filled 2023-01-14 (×4): qty 1

## 2023-01-14 MED ORDER — LACTULOSE 10 GM/15ML PO SOLN
20.0000 g | Freq: Two times a day (BID) | ORAL | Status: DC | PRN
  Administered 2023-01-15: 20 g via ORAL
  Filled 2023-01-14: qty 30

## 2023-01-14 MED ORDER — MORPHINE SULFATE (PF) 2 MG/ML IV SOLN: 2.0000 mg | INTRAVENOUS | Status: DC | PRN

## 2023-01-14 MED ORDER — SODIUM CHLORIDE 0.9 % IV SOLN
2.0000 g | Freq: Once | INTRAVENOUS | Status: AC
  Administered 2023-01-14: 2 g via INTRAVENOUS
  Filled 2023-01-14: qty 20

## 2023-01-14 MED ORDER — ONDANSETRON HCL 4 MG PO TABS: 4.0000 mg | ORAL_TABLET | Freq: Four times a day (QID) | ORAL | Status: DC | PRN

## 2023-01-14 MED ORDER — SODIUM CHLORIDE 0.9 % IV SOLN
2.0000 g | INTRAVENOUS | Status: DC
  Administered 2023-01-15 – 2023-01-18 (×4): 2 g via INTRAVENOUS
  Filled 2023-01-14 (×4): qty 20

## 2023-01-14 MED ORDER — ONDANSETRON HCL 4 MG/2ML IJ SOLN: 4.0000 mg | Freq: Four times a day (QID) | INTRAMUSCULAR | Status: DC | PRN

## 2023-01-14 NOTE — Progress Notes (Signed)
Patient tolerated right sided paracentesis and 50G of IV albumin well today and 5 Liters of cloudy amber ascites removed with labs collected and sent for processing. Patient verbalized understanding of discharge instructions and left via wheelchair with family with no acute distress noted.

## 2023-01-14 NOTE — ED Triage Notes (Signed)
Pt had paracentesis today here and they called him back stating fluid had high WBC. Took off 5 Liters. Pt c/o intermittent abd pain but not at this time. A/o. Color wnl. Had another para and TIPS procedure planned next week.

## 2023-01-14 NOTE — ED Provider Notes (Signed)
[place] EMERGENCY DEPARTMENT AT [facility] Provider Note   CSN: [id number] Arrival date & time: 01/14/23  1547     History  Chief Complaint  Patient presents with   high WBC in abd fluid    Caleb Wolf is a 82 y.o. male.  Patient has a history of cirrhosis.  He has been treated for SBP.  He had a tap today that showed 1200 white blood cells.  He was sent over here by GI to be admitted for SBP  The history is provided by the patient and medical records. No language interpreter was used.  Abdominal Pain Pain location:  Generalized Pain quality: aching   Pain radiates to:  Does not radiate Pain severity:  Moderate Onset quality:  Sudden Timing:  Constant Progression:  Worsening Chronicity:  Recurrent Context: not alcohol use   Relieved by:  Nothing Worsened by:  Nothing Associated symptoms: no chest pain, no cough, no diarrhea, no fatigue and no hematuria        Home Medications Prior to Admission medications   Medication Sig Start Date End Date Taking? Authorizing Provider  acetaminophen (TYLENOL) 500 MG tablet Take 1,000 mg by mouth every 6 (six) hours as needed for moderate pain.   Yes [provider]  amiodarone (PACERONE) 200 MG tablet Take 1 tablet (200 mg total) by mouth daily. 10/24/22  Yes Jake Bathe, MD  atorvastatin (LIPITOR) 80 MG tablet TAKE 1 TABLET BY MOUTH EVERY DAY IN THE EVENING 04/03/22  Yes Jake Bathe, MD  Cholecalciferol (VITAMIN D) 50 MCG (2000 UT) CAPS Take 2,000 Units by mouth daily.   Yes [provider]  cyanocobalamin (VITAMIN B12) 1000 MCG tablet Take 1,000 mcg by mouth See admin instructions. 2-3 TIMES WEEKLY PER PT'S WIFE   Yes [provider]  doxazosin (CARDURA) 4 MG tablet Take 4 mg by mouth in the morning. 07/06/17  Yes [provider]  empagliflozin (JARDIANCE) 10 MG TABS tablet Take 1 tablet (10 mg total) by mouth daily before breakfast. 06/11/22  Yes Jake Bathe, MD  feeding  supplement (BOOST HIGH PROTEIN) LIQD Take 1 Container by mouth at bedtime. One shake before bedtime.   Yes [provider]  finasteride (PROSCAR) 5 MG tablet Take 5 mg by mouth every evening.    Yes [provider]  fluticasone (FLONASE) 50 MCG/ACT nasal spray Place 1 spray into both nostrils daily as needed for allergies or rhinitis.   Yes [provider]  furosemide (LASIX) 80 MG tablet Take 1 tablet (80 mg total) by mouth 2 (two) times daily. 06/11/22  Yes Jake Bathe, MD  lactulose (CHRONULAC) 10 GM/15ML solution Take 30 mLs (20 g total) by mouth 2 (two) times daily. Can increase to twice a day depending on constipation improvement Patient taking differently: Take 20 g by mouth 3 (three) times daily. 12/22/22  Yes Carlan, Chelsea L, NP  nitroGLYCERIN (NITROSTAT) 0.4 MG SL tablet Place 1 tablet (0.4 mg total) under the tongue every 5 (five) minutes x 3 doses as needed for chest pain. 06/04/20  Yes Allred, Fayrene Fearing, MD  pantoprazole (PROTONIX) 40 MG tablet Take 1 tablet (40 mg total) by mouth daily before breakfast. 02/19/16  Yes Rehman, Joline Maxcy, MD  Rivaroxaban (XARELTO) 15 MG TABS tablet Take 1 tablet (15 mg total) by mouth daily with supper. 12/16/21  Yes Tat, Onalee Hua, MD  spironolactone (ALDACTONE) 25 MG tablet Take 1 tablet (25 mg total) by mouth daily. 11/10/22  Yes Marguerita Merles, Reuel Boom, MD  sulfamethoxazole-trimethoprim (BACTRIM DS) 800-160 MG tablet Take 1 tablet by mouth daily. 01/08/23  Yes Dolores Frame, MD      Allergies    Codeine sulfate    Review of Systems   Review of Systems  Constitutional:  Negative for appetite change and fatigue.  HENT:  Negative for congestion, ear discharge and sinus pressure.   Eyes:  Negative for discharge.  Respiratory:  Negative for cough.   Cardiovascular:  Negative for chest pain.  Gastrointestinal:  Positive for abdominal pain. Negative for diarrhea.  Genitourinary:  Negative for frequency and hematuria.   Musculoskeletal:  Negative for back pain.  Skin:  Negative for rash.  Neurological:  Negative for seizures and headaches.  Psychiatric/Behavioral:  Negative for hallucinations.     Physical Exam Updated Vital Signs BP (!) 151/52   Pulse (!) 55   Temp 98.3 F (36.8 C) (Oral)   Resp (!) 21   SpO2 99%  Physical Exam Vitals and nursing note reviewed.  Constitutional:      Appearance: He is well-developed.  HENT:     Head: Normocephalic.     Mouth/Throat:     Mouth: Mucous membranes are moist.  Eyes:     General: No scleral icterus.    Conjunctiva/sclera: Conjunctivae normal.  Neck:     Thyroid: No thyromegaly.  Cardiovascular:     Rate and Rhythm: Normal rate and regular rhythm.     Heart sounds: No murmur heard.    No friction rub. No gallop.  Pulmonary:     Breath sounds: No stridor. No wheezing or rales.  Chest:     Chest wall: No tenderness.  Abdominal:     General: There is no distension.     Tenderness: There is abdominal tenderness. There is no rebound.  Musculoskeletal:        General: Normal range of motion.     Cervical back: Neck supple.  Lymphadenopathy:     Cervical: No cervical adenopathy.  Skin:    Findings: No erythema or rash.  Neurological:     Mental Status: He is oriented to person, place, and time.     Motor: No abnormal muscle tone.     Coordination: Coordination normal.  Psychiatric:        Behavior: Behavior normal.     ED Results / Procedures / Treatments   Labs (all labs ordered are listed, but only abnormal results are displayed) Labs Reviewed  COMPREHENSIVE METABOLIC PANEL - Abnormal; Notable for the following components:      Result Value   Sodium 134 (*)    Glucose, Bld 112 (*)    BUN 39 (*)    Creatinine, Ser 2.03 (*)    Calcium 8.0 (*)    Total Protein 6.0 (*)    Albumin 3.3 (*)    GFR, Estimated 33 (*)    All other components within normal limits  CBC - Abnormal; Notable for the following components:   RBC 3.46 (*)     Hemoglobin 9.8 (*)    HCT 31.3 (*)    RDW 16.4 (*)    All other components within normal limits  URINALYSIS, ROUTINE W REFLEX MICROSCOPIC - Abnormal; Notable for the following components:   Glucose, UA >=500 (*)    Bacteria, UA RARE (*)    All other components within normal limits    EKG None  Radiology US Paracentesis  Result Date: 01/14/2023 INDICATION: Recurrent ascites EXAM: ULTRASOUND GUIDED  PARACENTESIS MEDICATIONS: None. COMPLICATIONS: None immediate. PROCEDURE: Informed written consent was obtained from the patient after a discussion of the risks, benefits and alternatives to treatment. A timeout was performed prior to the initiation of the procedure. Initial ultrasound scanning demonstrates a large amount of ascites within the right lower abdominal quadrant. The right lower abdomen was prepped and draped in the usual sterile fashion. 1% lidocaine was used for local anesthesia. Following this, a 8 Fr Safe-T-Centesis catheter was introduced. An ultrasound image was saved for documentation purposes. The paracentesis was performed. The catheter was removed and a dressing was applied. The patient tolerated the procedure well without immediate post procedural complication. FINDINGS: A total of approximately 5 L of straw-colored fluid was removed. IMPRESSION: Successful ultrasound-guided paracentesis yielding 5 liters of peritoneal fluid. Electronically Signed   By: Acquanetta Belling [name]   On: 01/14/2023 11:40    Procedures Procedures    Medications Ordered in ED Medications  cefTRIAXone (ROCEPHIN) 2 g in sodium chloride 0.9 % 100 mL IVPB (0 g Intravenous Stopped 01/14/23 2109)    ED Course/ Medical Decision Making/ A&P                                 Medical Decision Making Amount and/or Complexity of Data Reviewed Labs: ordered.  Risk Decision regarding hospitalization.  This patient presents to the ED for concern of cirrhosis and SBP, this involves an extensive number of  treatment options, and is a complaint that carries with it a high risk of complications and morbidity.  The differential diagnosis includes SBP   Co morbidities that complicate the patient evaluation  Cirrhosis   Additional history obtained:  Additional history obtained from GI doc and patient External records from outside source obtained and reviewed including hospital records   Lab Tests:  I Ordered, and personally interpreted labs.  The pertinent results include: Peritoneal tap shows 1200 white cells, hemoglobin 9.3   Imaging Studies ordered: No imaging  Cardiac Monitoring: / EKG:  The patient was maintained on a cardiac monitor.  I personally viewed and interpreted the cardiac monitored which showed an underlying rhythm of: Normal sinus rhythm   Consultations Obtained:  I requested consultation with the GI and hospitalist,  and discussed lab and imaging findings as well as pertinent plan - they recommend: Admit to hospitalist with antibiotics and GI consult   Problem List / ED Course / Critical interventions / Medication management  Cirrhosis and SBP I ordered medication including antibiotics for SBP Reevaluation of the patient after these medicines showed that the patient stayed the same I have reviewed the patients home medicines and have made adjustments as needed   Social Determinants of Health:  None   Test / Admission - Considered:  None  Patient with an SBP.  He is admitted to medicine and will be consulted by GI.        Final Clinical Impression(s) / ED Diagnoses Final diagnoses:  SBP (spontaneous bacterial peritonitis) (HCC)    Rx / DC Orders ED Discharge Orders     None         Bethann Berkshire, MD 01/16/23 1124

## 2023-01-14 NOTE — ED Notes (Signed)
ED TO INPATIENT HANDOFF REPORT  ED Nurse Name and Phone #: Louie Casa Name/Age/Gender Caleb Wolf 82 y.o. male Room/Bed: APAH8/APAH8  Code Status   Code Status: Full Code  Home/SNF/Other Home Patient oriented to: self, place, time, and situation Is this baseline? Yes   Triage Complete: Triage complete  Chief Complaint Spontaneous bacterial peritonitis (HCC) [K65.2]  Triage Note Pt had paracentesis today here and they called him back stating fluid had high WBC. Took off 5 Liters. Pt c/o intermittent abd pain but not at this time. A/o. Color wnl. Had another para and TIPS procedure planned next week.    Allergies Allergies  Allergen Reactions   Codeine Sulfate Nausea Only    Level of Care/Admitting Diagnosis ED Disposition     ED Disposition  Admit   Condition  --   Comment  Hospital Area: [facility] [[id number]]  Level of Care: Med-Surg [16]  Covid Evaluation: Asymptomatic - no recent exposure (last 10 days) testing not required  Diagnosis: Spontaneous bacterial peritonitis (HCC) [725.36.ICD-9-CM]  Admitting Physician: Lilyan Gilford [[id number]]  Attending Physician: Lilyan Gilford [[id number]]  Certification:: I certify this patient will need inpatient services for at least 2 midnights  Expected Medical Readiness: 01/18/2023          B Medical/Surgery History Past Medical History:  Diagnosis Date   AAA (abdominal aortic aneurysm) (HCC)    Arthritis    Basal cell carcinoma    Carotid stenosis    Chronic kidney disease, stage 3b (HCC) 12/13/2021   Coronary artery disease    Multvessel s/p CABG 2015   Enlarged prostate    Essential hypertension    GERD (gastroesophageal reflux disease)    Gout    History of colon polyps    History of kidney stones    Hyperlipidemia    Lumbar disc disease    Persistent atrial fibrillation (HCC)    Sleep apnea    CPAP   Past Surgical History:  Procedure Laterality Date   ABLATION OF DYSRHYTHMIC  FOCUS  07/28/2017   ATRIAL FIBRILLATION ABLATION N/A 07/28/2017   Procedure: ATRIAL FIBRILLATION ABLATION;  Surgeon: Hillis Range, MD;  Location: [facility] INVASIVE CV LAB;  Service: Cardiovascular;  Laterality: N/A;   ATRIAL FIBRILLATION ABLATION N/A 12/04/2020   Procedure: ATRIAL FIBRILLATION ABLATION;  Surgeon: Hillis Range, MD;  Location: [facility] INVASIVE CV LAB;  Service: Cardiovascular;  Laterality: N/A;   BACK SURGERY     CARDIAC CATHETERIZATION  05/22/2014   Procedure: IABP INSERTION;  Surgeon: Marykay Lex, MD;  Location: [facility] CATH LAB;  Service: Cardiovascular;;   CARDIOVERSION N/A 04/29/2017   Procedure: CARDIOVERSION;  Surgeon: Jonelle Sidle, MD;  Location: [facility] ENDO SUITE;  Service: Cardiovascular;  Laterality: N/A;   CARDIOVERSION N/A 08/13/2017   Procedure: CARDIOVERSION;  Surgeon: Chrystie Nose, MD;  Location: [facility] ENDOSCOPY;  Service: Cardiovascular;  Laterality: N/A;   CARDIOVERSION N/A 08/15/2019   Procedure: CARDIOVERSION;  Surgeon: Lars Masson, MD;  Location: [facility] ENDOSCOPY;  Service: Cardiovascular;  Laterality: N/A;   CARDIOVERSION N/A 03/09/2020   Procedure: CARDIOVERSION;  Surgeon: Jodelle Red, MD;  Location: [facility] ENDOSCOPY;  Service: Cardiovascular;  Laterality: N/A;   CARDIOVERSION N/A 07/06/2020   Procedure: CARDIOVERSION;  Surgeon: Thurmon Fair, MD;  Location: [facility] ENDOSCOPY;  Service: Cardiovascular;  Laterality: N/A;   CARDIOVERSION N/A 01/18/2021   Procedure: CARDIOVERSION;  Surgeon: Chilton Si, MD;  Location: [facility] ENDOSCOPY;  Service: Cardiovascular;  Laterality: N/A;   CARDIOVERSION N/A 06/17/2021  Procedure: CARDIOVERSION;  Surgeon: Sande Rives, MD;  Location: [facility] ENDOSCOPY;  Service: Cardiovascular;  Laterality: N/A;   Cataract surgery Right    COLONOSCOPY     COLONOSCOPY N/A 12/30/2017   Procedure: COLONOSCOPY;  Surgeon: Malissa Hippo, MD;  Location: [facility] ENDO [facility];  Service: Endoscopy;  Laterality: N/A;   CORONARY ARTERY BYPASS GRAFT N/A  05/22/2014   Procedure: CORONARY ARTERY BYPASS GRAFTING (CABG) times three using left internal mammary and right saphenous vein.;  Surgeon: Loreli Slot, MD;  Location: [facility] OR;  Service: Open Heart Surgery;  Laterality: N/A;   ENDARTERECTOMY Left 02/17/2014   Procedure: ENDARTERECTOMY CAROTID WITH PATCH ANGIOPLASTY;  Surgeon: Pryor Ochoa, MD;  Location: [facility] OR;  Service: Vascular;  Laterality: Left;   ESOPHAGEAL DILATION N/A 08/22/2015   Procedure: ESOPHAGEAL DILATION;  Surgeon: Malissa Hippo, MD;  Location: [facility] ENDO [facility]ITE;  Service: Endoscopy;  Laterality: N/A;   ESOPHAGOGASTRODUODENOSCOPY N/A 08/22/2015   Procedure: ESOPHAGOGASTRODUODENOSCOPY (EGD);  Surgeon: Malissa Hippo, MD;  Location: [facility] ENDO SUITE;  Service: Endoscopy;  Laterality: N/A;  12:45 - moved to 1:55 - Ann notified pt   ESOPHAGOGASTRODUODENOSCOPY N/A 12/30/2017   Procedure: ESOPHAGOGASTRODUODENOSCOPY (EGD);  Surgeon: Malissa Hippo, MD;  Location: [facility] ENDO [facility];  Service: Endoscopy;  Laterality: N/A;  200   EYE SURGERY     cataract extraction (right) with repair macular tear , with IOL     right   FRACTURE SURGERY     bilateral wrist fractures- one ORIF   IR RADIOLOGIST EVAL & MGMT  12/01/2022   JOINT REPLACEMENT  2011   left knee   LEFT HEART CATHETERIZATION WITH CORONARY ANGIOGRAM N/A 05/22/2014   Procedure: LEFT HEART CATHETERIZATION WITH CORONARY ANGIOGRAM;  Surgeon: Marykay Lex, MD;  Location: [facility] CATH LAB;  Service: Cardiovascular;  Laterality: N/A;   LITHOTRIPSY     PARS PLANA VITRECTOMY W/ REPAIR OF MACULAR HOLE     RHINOPLASTY     TEE WITHOUT CARDIOVERSION N/A 04/29/2017   Procedure: TRANSESOPHAGEAL ECHOCARDIOGRAM (TEE) WITH PROPOFOL;  Surgeon: Jonelle Sidle, MD;  Location: [facility] ENDO SUITE;  Service: Cardiovascular;  Laterality: N/A;   TONSILLECTOMY     TOTAL HIP ARTHROPLASTY  12/08/2011   Procedure: TOTAL HIP ARTHROPLASTY;  Surgeon: Loanne Drilling, MD;  Location: [facility] ORS;  Service: Orthopedics;   Laterality: Right;   UPPER GI ENDOSCOPY  12/18/2015   Procedure: UPPER GI ENDOSCOPY;  Surgeon: Axel Filler, MD;  Location: [facility] ORS;  Service: General;;   WRIST SURGERY Right 58yrs ago   WRIST SURGERY Left      A IV Location/Drains/Wounds Patient Lines/Drains/Airways Status     Active Line/Drains/Airways     Name Placement date Placement time Site Days   Peripheral IV 01/14/23 22 G 1" Right Forearm 01/14/23  1944  Forearm  less than 1            Intake/Output Last 24 hours  Intake/Output Summary (Last 24 hours) at 01/14/2023 2228 Last data filed at 01/14/2023 2100 Gross per 24 hour  Intake --  Output 300 ml  Net -300 ml    Labs/Imaging Results for orders placed or performed during the hospital encounter of 01/14/23 (from the past 48 hour(s))  Comprehensive metabolic panel     Status: Abnormal   Collection Time: 01/14/23  5:01 PM  Result Value Ref Range   Sodium 134 (L) 135 - 145 mmol/L   Potassium 3.6 3.5 - 5.1 mmol/L   Chloride 98 98 - 111  mmol/L   CO2 27 22 - 32 mmol/L   Glucose, Bld 112 (H) 70 - 99 mg/dL    Comment: Glucose reference range applies only to samples taken after fasting for at least 8 hours.   BUN 39 (H) 8 - 23 mg/dL   Creatinine, Ser 4.09 (H) 0.61 - 1.24 mg/dL   Calcium 8.0 (L) 8.9 - 10.3 mg/dL   Total Protein 6.0 (L) 6.5 - 8.1 g/dL   Albumin 3.3 (L) 3.5 - 5.0 g/dL   AST 25 15 - 41 U/L   ALT 33 0 - 44 U/L   Alkaline Phosphatase 63 38 - 126 U/L   Total Bilirubin 0.4 0.3 - 1.2 mg/dL   GFR, Estimated 33 (L) >60 mL/min    Comment: (NOTE) Calculated using the CKD-EPI Creatinine Equation (2021)    Anion gap 9 5 - 15    Comment: Performed at [facility], [address] [place], [place] [address]  CBC     Status: Abnormal   Collection Time: 01/14/23  5:01 PM  Result Value Ref Range   WBC 7.9 4.0 - 10.5 K/uL   RBC 3.46 (L) 4.22 - 5.81 MIL/uL   Hemoglobin 9.8 (L) 13.0 - 17.0 g/dL   HCT 47.8 (L) 29.5 - 62.1 %   MCV 90.5 80.0 - 100.0 fL   MCH  28.3 26.0 - 34.0 pg   MCHC 31.3 30.0 - 36.0 g/dL   RDW 30.8 (H) 65.7 - 84.6 %   Platelets 285 150 - 400 K/uL   nRBC 0.0 0.0 - 0.2 %    Comment: Performed at [facility], [address], [place], [place] [address]  Urinalysis, Routine w reflex microscopic -Urine, Clean Catch     Status: Abnormal   Collection Time: 01/14/23  6:33 PM  Result Value Ref Range   Color, Urine YELLOW YELLOW   APPearance CLEAR CLEAR   Specific Gravity, Urine 1.009 1.005 - 1.030   pH 5.0 5.0 - 8.0   Glucose, UA >=500 (A) NEGATIVE mg/dL   Hgb urine dipstick NEGATIVE NEGATIVE   Bilirubin Urine NEGATIVE NEGATIVE   Ketones, ur NEGATIVE NEGATIVE mg/dL   Protein, ur NEGATIVE NEGATIVE mg/dL   Nitrite NEGATIVE NEGATIVE   Leukocytes,Ua NEGATIVE NEGATIVE   RBC / HPF 0-5 0 - 5 RBC/hpf   WBC, UA 0-5 0 - 5 WBC/hpf   Bacteria, UA RARE (A) NONE SEEN   Squamous Epithelial / HPF 0-5 0 - 5 /HPF   Mucus PRESENT    Hyaline Casts, UA PRESENT     Comment: Performed at [facility], [address] [place], [place] [address]   US Paracentesis  Result Date: 01/14/2023 INDICATION: Recurrent ascites EXAM: ULTRASOUND GUIDED  PARACENTESIS MEDICATIONS: None. COMPLICATIONS: None immediate. PROCEDURE: Informed written consent was obtained from the patient after a discussion of the risks, benefits and alternatives to treatment. A timeout was performed prior to the initiation of the procedure. Initial ultrasound scanning demonstrates a large amount of ascites within the right lower abdominal quadrant. The right lower abdomen was prepped and draped in the usual sterile fashion. 1% lidocaine was used for local anesthesia. Following this, a 8 Fr Safe-T-Centesis catheter was introduced. An ultrasound image was saved for documentation purposes. The paracentesis was performed. The catheter was removed and a dressing was applied. The patient tolerated the procedure well without immediate post procedural complication. FINDINGS: A total of approximately 5  L of straw-colored fluid was removed. IMPRESSION: Successful ultrasound-guided paracentesis yielding 5 liters of peritoneal fluid.  Electronically Signed   By: Acquanetta Belling [name]   On: 01/14/2023 11:40    Pending Labs Unresulted Labs (From admission, onward)     Start     Ordered   01/15/23 0500  Ammonia  Tomorrow morning,   R        01/14/23 2131   01/15/23 0500  Comprehensive metabolic panel  Tomorrow morning,   R        01/14/23 2131   01/15/23 0500  Magnesium  Tomorrow morning,   R        01/14/23 2131   01/15/23 0500  CBC with Differential/Platelet  Tomorrow morning,   R        01/14/23 2131            Vitals/Pain Today's Vitals   01/14/23 2145 01/14/23 2200 01/14/23 2214 01/14/23 2216  BP: (!) 118/49 (!) 124/53    Pulse: (!) 49 (!) 46    Resp: 15 17    Temp:   98.5 F (36.9 C)   TempSrc:   Oral   SpO2: 98% 97%    PainSc:    0-No pain    Isolation Precautions No active isolations  Medications Medications  amiodarone (PACERONE) tablet 200 mg (has no administration in time range)  atorvastatin (LIPITOR) tablet 80 mg (80 mg Oral Given 01/14/23 2217)  doxazosin (CARDURA) tablet 4 mg (has no administration in time range)  spironolactone (ALDACTONE) tablet 25 mg (has no administration in time range)  pantoprazole (PROTONIX) EC tablet 40 mg (has no administration in time range)  lactulose (CHRONULAC) 10 GM/15ML solution 20 g (has no administration in time range)  finasteride (PROSCAR) tablet 5 mg (5 mg Oral Given 01/14/23 2217)  Rivaroxaban (XARELTO) tablet 15 mg (has no administration in time range)  acetaminophen (TYLENOL) tablet 650 mg (has no administration in time range)    Or  acetaminophen (TYLENOL) suppository 650 mg (has no administration in time range)  oxyCODONE (Oxy IR/ROXICODONE) immediate release tablet 5 mg (has no administration in time range)  morphine (PF) 2 MG/ML injection 2 mg (has no administration in time range)  ondansetron (ZOFRAN) tablet 4 mg  (has no administration in time range)    Or  ondansetron (ZOFRAN) injection 4 mg (has no administration in time range)  cefTRIAXone (ROCEPHIN) 2 g in sodium chloride 0.9 % 100 mL IVPB (has no administration in time range)  cefTRIAXone (ROCEPHIN) 2 g in sodium chloride 0.9 % 100 mL IVPB (0 g Intravenous Stopped 01/14/23 2109)    Mobility walks with device - pt has personal cane at bedside      Focused Assessments GI distended, Recent drainage RLQ.     R Recommendations: See Admitting Provider Note  Report given to:   Additional Notes: Pending GI consult

## 2023-01-15 DIAGNOSIS — K7460 Unspecified cirrhosis of liver: Secondary | ICD-10-CM

## 2023-01-15 DIAGNOSIS — R188 Other ascites: Secondary | ICD-10-CM

## 2023-01-15 DIAGNOSIS — E7800 Pure hypercholesterolemia, unspecified: Secondary | ICD-10-CM | POA: Diagnosis not present

## 2023-01-15 DIAGNOSIS — K652 Spontaneous bacterial peritonitis: Secondary | ICD-10-CM | POA: Diagnosis not present

## 2023-01-15 DIAGNOSIS — I4819 Other persistent atrial fibrillation: Secondary | ICD-10-CM

## 2023-01-15 DIAGNOSIS — N179 Acute kidney failure, unspecified: Secondary | ICD-10-CM

## 2023-01-15 DIAGNOSIS — N1832 Chronic kidney disease, stage 3b: Secondary | ICD-10-CM

## 2023-01-15 DIAGNOSIS — I10 Essential (primary) hypertension: Secondary | ICD-10-CM

## 2023-01-15 LAB — CBC WITH DIFFERENTIAL/PLATELET
Abs Immature Granulocytes: 0.07 10*3/uL (ref 0.00–0.07)
Basophils Absolute: 0 10*3/uL (ref 0.0–0.1)
Basophils Relative: 0 %
Eosinophils Absolute: 0.1 10*3/uL (ref 0.0–0.5)
Eosinophils Relative: 1 %
HCT: 29.6 % — ABNORMAL LOW (ref 39.0–52.0)
Hemoglobin: 9.3 g/dL — ABNORMAL LOW (ref 13.0–17.0)
Immature Granulocytes: 1 %
Lymphocytes Relative: 7 %
Lymphs Abs: 0.6 10*3/uL — ABNORMAL LOW (ref 0.7–4.0)
MCH: 27.9 pg (ref 26.0–34.0)
MCHC: 31.4 g/dL (ref 30.0–36.0)
MCV: 88.9 fL (ref 80.0–100.0)
Monocytes Absolute: 0.7 10*3/uL (ref 0.1–1.0)
Monocytes Relative: 8 %
Neutro Abs: 7.6 10*3/uL (ref 1.7–7.7)
Neutrophils Relative %: 83 %
Platelets: 287 10*3/uL (ref 150–400)
RBC: 3.33 MIL/uL — ABNORMAL LOW (ref 4.22–5.81)
RDW: 16 % — ABNORMAL HIGH (ref 11.5–15.5)
WBC: 9.2 10*3/uL (ref 4.0–10.5)
nRBC: 0 % (ref 0.0–0.2)

## 2023-01-15 LAB — PATHOLOGIST SMEAR REVIEW

## 2023-01-15 LAB — COMPREHENSIVE METABOLIC PANEL
ALT: 30 U/L (ref 0–44)
AST: 19 U/L (ref 15–41)
Albumin: 2.8 g/dL — ABNORMAL LOW (ref 3.5–5.0)
Alkaline Phosphatase: 61 U/L (ref 38–126)
Anion gap: 8 (ref 5–15)
BUN: 38 mg/dL — ABNORMAL HIGH (ref 8–23)
CO2: 26 mmol/L (ref 22–32)
Calcium: 8 mg/dL — ABNORMAL LOW (ref 8.9–10.3)
Chloride: 101 mmol/L (ref 98–111)
Creatinine, Ser: 1.97 mg/dL — ABNORMAL HIGH (ref 0.61–1.24)
GFR, Estimated: 34 mL/min — ABNORMAL LOW (ref >60)
Glucose, Bld: 99 mg/dL (ref 70–99)
Potassium: 3.9 mmol/L (ref 3.5–5.1)
Sodium: 135 mmol/L (ref 135–145)
Total Bilirubin: 0.3 mg/dL (ref 0.3–1.2)
Total Protein: 5.4 g/dL — ABNORMAL LOW (ref 6.5–8.1)

## 2023-01-15 LAB — AMMONIA: Ammonia: 15 umol/L (ref 9–35)

## 2023-01-15 LAB — MAGNESIUM: Magnesium: 2.3 mg/dL (ref 1.7–2.4)

## 2023-01-15 MED ORDER — ALBUMIN HUMAN 25 % IV SOLN
25.0000 g | Freq: Four times a day (QID) | INTRAVENOUS | Status: AC
  Administered 2023-01-16 (×3): 25 g via INTRAVENOUS
  Filled 2023-01-15 (×3): qty 100

## 2023-01-15 MED ORDER — ALBUMIN HUMAN 25 % IV SOLN
50.0000 g | Freq: Once | INTRAVENOUS | Status: AC
  Administered 2023-01-15: 50 g via INTRAVENOUS
  Filled 2023-01-15: qty 200

## 2023-01-15 MED ORDER — FUROSEMIDE 40 MG PO TABS
80.0000 mg | ORAL_TABLET | Freq: Two times a day (BID) | ORAL | Status: DC
  Administered 2023-01-15 – 2023-01-18 (×6): 80 mg via ORAL
  Filled 2023-01-15 (×6): qty 2

## 2023-01-15 NOTE — Assessment & Plan Note (Signed)
-   at bedtime CPAP

## 2023-01-15 NOTE — Assessment & Plan Note (Signed)
s/p CABG 2015 - continue lipitor and xarelto

## 2023-01-15 NOTE — Progress Notes (Signed)
Progress Note    NEIZAN HEINO   ZOX:[id number]  DOB: Aug 19, 1940  DOA: 01/14/2023     1 PCP: Carylon Perches, MD  Initial CC: SBP  Hospital Course: Mr. Hardacre is an 82 yo male with PMH cirrhosis, CAD s/p CABG 2015, CKD3b, AAA, arthritis, HTN, gout, GERD, HLD, persistent A-fib s/p ablation 2019 & 2022 followed by DCCV 06/2021 due to recurrence again, sleep apnea.  He has been followed by GI and cardiology closely. He has started to undergo paracenteses that started on 11/12/2022. He underwent 7th paracentesis on 01/14/23. He is scheduled for a TIPS on 01/21/23 as well.  He was diagnosed with SBP after paracentesis on 12/26/22; treated with rocephin and discharged on vantin to complete course.  This was to be followed by Cipro for SBP prophylaxis thereafter. However, due to concurrent amiodarone use there was concern for QTc prolongation and he was changed to daily Bactrim DS which his wife states he'd had 4 days of prior to this admission.  He then underwent paracentesis once again on 01/14/2023.  Cell count was again consistent with SBP and he was admitted for antibiotics and further workup.  Interval History:  Patient referred to the hospital yesterday after cell count from paracentesis returned elevated. He is asymptomatic and wife is present bedside when seen in the ER this morning.  Assessment and Plan: * Spontaneous bacterial peritonitis (HCC) - repeat paracentesis (7th since 6/12) performed on 8/14 - PMN count 360 consistent with SBP; patient seemingly asymptomatic both times now - restarted back on rocephin; was also started on bactrim outpatient for ppx - follow up culture results - suspect some overdiuresis contributing to mild renal fxn worsening, but will need routine monitoring of renal function in setting of long term bactrim use for ongoing ppx  Chronic kidney disease, stage 3b (HCC) - patient has history of CKD3b. Baseline creat ~ 1.8 - 1.9, eGFR~ 35 - creat 2.03 on admission; not  consistent with true acute worsening but does have potential in setting of aggressive diuretic regimen, routine paracentesis, and bactrim use - if able to get TIPS, then that would reduce paracentesis dependency, but would benefit from intermittent BMP monitoring  Hepatic cirrhosis (HCC) - continue lactulose and spironolactone - lasix on hold on admission but should be okay to resume at this time  Persistent atrial fibrillation [facility]) - s/p ablation 2019 & 2022 followed by DCCV 06/2021 -Continue amiodarone and xarelto  CAD (coronary artery disease) s/p CABG 2015 - continue lipitor and xarelto   HLD (hyperlipidemia) -Continue statin  HTN (hypertension) -Continue cardura, proscar -Continue aldactone   OSA (obstructive sleep apnea) - at bedtime CPAP   Old records reviewed in assessment of this patient  Antimicrobials: Rocephin 8/15 >> current   DVT prophylaxis:  SCDs Start: 01/14/23 2132 Rivaroxaban (XARELTO) tablet 15 mg   Code Status:   Code Status: DNR  Mobility Assessment (Last 72 Hours)     Mobility Assessment   No documentation.           Barriers to discharge: none Disposition Plan:  Home Status is: Inpt  Objective: Blood pressure (!) 143/49, pulse (!) 51, temperature 98 F (36.7 C), temperature source Oral, resp. rate 16, height 5\' 9"  (1.753 m), weight 75.5 kg, SpO2 100%.  Examination:  Physical Exam Constitutional:      General: He is not in acute distress.    Appearance: Normal appearance.  HENT:     Head: Normocephalic and atraumatic.     Mouth/Throat:  Mouth: Mucous membranes are moist.  Eyes:     Extraocular Movements: Extraocular movements intact.  Cardiovascular:     Rate and Rhythm: Normal rate and regular rhythm.  Pulmonary:     Effort: Pulmonary effort is normal. No respiratory distress.     Breath sounds: Normal breath sounds. No wheezing.  Abdominal:     General: Bowel sounds are normal. There is distension.     Palpations:  Abdomen is soft.     Tenderness: There is no abdominal tenderness.  Musculoskeletal:        General: Normal range of motion.     Cervical back: Normal range of motion and neck supple.  Skin:    General: Skin is warm and dry.  Neurological:     General: No focal deficit present.     Mental Status: He is alert.  Psychiatric:        Mood and Affect: Mood normal.        Behavior: Behavior normal.      Consultants:  GI  Procedures:    Data Reviewed: Results for orders placed or performed during the hospital encounter of 01/14/23 (from the past 24 hour(s))  Comprehensive metabolic panel     Status: Abnormal   Collection Time: 01/14/23  5:01 PM  Result Value Ref Range   Sodium 134 (L) 135 - 145 mmol/L   Potassium 3.6 3.5 - 5.1 mmol/L   Chloride 98 98 - 111 mmol/L   CO2 27 22 - 32 mmol/L   Glucose, Bld 112 (H) 70 - 99 mg/dL   BUN 39 (H) 8 - 23 mg/dL   Creatinine, Ser 1.61 (H) 0.61 - 1.24 mg/dL   Calcium 8.0 (L) 8.9 - 10.3 mg/dL   Total Protein 6.0 (L) 6.5 - 8.1 g/dL   Albumin 3.3 (L) 3.5 - 5.0 g/dL   AST 25 15 - 41 U/L   ALT 33 0 - 44 U/L   Alkaline Phosphatase 63 38 - 126 U/L   Total Bilirubin 0.4 0.3 - 1.2 mg/dL   GFR, Estimated 33 (L) >60 mL/min   Anion gap 9 5 - 15  CBC     Status: Abnormal   Collection Time: 01/14/23  5:01 PM  Result Value Ref Range   WBC 7.9 4.0 - 10.5 K/uL   RBC 3.46 (L) 4.22 - 5.81 MIL/uL   Hemoglobin 9.8 (L) 13.0 - 17.0 g/dL   HCT 09.6 (L) 04.5 - 40.9 %   MCV 90.5 80.0 - 100.0 fL   MCH 28.3 26.0 - 34.0 pg   MCHC 31.3 30.0 - 36.0 g/dL   RDW 81.1 (H) 91.4 - 78.2 %   Platelets 285 150 - 400 K/uL   nRBC 0.0 0.0 - 0.2 %  Urinalysis, Routine w reflex microscopic -Urine, Clean Catch     Status: Abnormal   Collection Time: 01/14/23  6:33 PM  Result Value Ref Range   Color, Urine YELLOW YELLOW   APPearance CLEAR CLEAR   Specific Gravity, Urine 1.009 1.005 - 1.030   pH 5.0 5.0 - 8.0   Glucose, UA >=500 (A) NEGATIVE mg/dL   Hgb urine dipstick  NEGATIVE NEGATIVE   Bilirubin Urine NEGATIVE NEGATIVE   Ketones, ur NEGATIVE NEGATIVE mg/dL   Protein, ur NEGATIVE NEGATIVE mg/dL   Nitrite NEGATIVE NEGATIVE   Leukocytes,Ua NEGATIVE NEGATIVE   RBC / HPF 0-5 0 - 5 RBC/hpf   WBC, UA 0-5 0 - 5 WBC/hpf   Bacteria, UA RARE (A) NONE SEEN  Squamous Epithelial / HPF 0-5 0 - 5 /HPF   Mucus PRESENT    Hyaline Casts, UA PRESENT   Ammonia     Status: None   Collection Time: 01/15/23  5:13 AM  Result Value Ref Range   Ammonia 15 9 - 35 umol/L  Comprehensive metabolic panel     Status: Abnormal   Collection Time: 01/15/23  5:13 AM  Result Value Ref Range   Sodium 135 135 - 145 mmol/L   Potassium 3.9 3.5 - 5.1 mmol/L   Chloride 101 98 - 111 mmol/L   CO2 26 22 - 32 mmol/L   Glucose, Bld 99 70 - 99 mg/dL   BUN 38 (H) 8 - 23 mg/dL   Creatinine, Ser 1.61 (H) 0.61 - 1.24 mg/dL   Calcium 8.0 (L) 8.9 - 10.3 mg/dL   Total Protein 5.4 (L) 6.5 - 8.1 g/dL   Albumin 2.8 (L) 3.5 - 5.0 g/dL   AST 19 15 - 41 U/L   ALT 30 0 - 44 U/L   Alkaline Phosphatase 61 38 - 126 U/L   Total Bilirubin 0.3 0.3 - 1.2 mg/dL   GFR, Estimated 34 (L) >60 mL/min   Anion gap 8 5 - 15  Magnesium     Status: None   Collection Time: 01/15/23  5:13 AM  Result Value Ref Range   Magnesium 2.3 1.7 - 2.4 mg/dL  CBC with Differential/Platelet     Status: Abnormal   Collection Time: 01/15/23  5:13 AM  Result Value Ref Range   WBC 9.2 4.0 - 10.5 K/uL   RBC 3.33 (L) 4.22 - 5.81 MIL/uL   Hemoglobin 9.3 (L) 13.0 - 17.0 g/dL   HCT 09.6 (L) 04.5 - 40.9 %   MCV 88.9 80.0 - 100.0 fL   MCH 27.9 26.0 - 34.0 pg   MCHC 31.4 30.0 - 36.0 g/dL   RDW 81.1 (H) 91.4 - 78.2 %   Platelets 287 150 - 400 K/uL   nRBC 0.0 0.0 - 0.2 %   Neutrophils Relative % 83 %   Neutro Abs 7.6 1.7 - 7.7 K/uL   Lymphocytes Relative 7 %   Lymphs Abs 0.6 (L) 0.7 - 4.0 K/uL   Monocytes Relative 8 %   Monocytes Absolute 0.7 0.1 - 1.0 K/uL   Eosinophils Relative 1 %   Eosinophils Absolute 0.1 0.0 - 0.5 K/uL    Basophils Relative 0 %   Basophils Absolute 0.0 0.0 - 0.1 K/uL   Immature Granulocytes 1 %   Abs Immature Granulocytes 0.07 0.00 - 0.07 K/uL    I have reviewed pertinent nursing notes, vitals, labs, and images as necessary. I have ordered labwork to follow up on as indicated.  I have reviewed the last notes from staff over past 24 hours. I have discussed patient's care plan and test results with nursing staff, CM/SW, and other staff as appropriate.  Time spent: Greater than 50% of the 55 minute visit was spent in counseling/coordination of care for the patient as laid out in the A&P.   LOS: 1 day   Lewie Chamber, MD [facility] 01/15/2023, 3:10 PM

## 2023-01-15 NOTE — Assessment & Plan Note (Signed)
Continue statin. 

## 2023-01-15 NOTE — H&P (Signed)
History and Physical    Patient: Caleb Wolf WUJ:[id number] DOB: [birth year] DOA: 01/14/2023 DOS: the patient was seen and examined on 01/15/2023 PCP: Carylon Perches, MD  Patient coming from: Home  Chief Complaint:  Chief Complaint  Patient presents with   high WBC in abd fluid   HPI: Caleb Wolf is a 82 y.o. male with medical history significant of AAA, basal cell carcinoma, CKD, coronary artery disease, GERD, hypertension, hyperlipidemia, atrial fibrillation, cirrhosis with plan for TIPS procedure next week, presents to the ED with a chief complaint of bacterial peritonitis.  Patient reports that he recently finished a course of Bactrim.  He had more fluid pulled off of his abdomen on the day of presentation, and he was called at home and told to come in because that he still had bacteria in the fluid.  Patient reports mild abdominal pain.  He denies any nausea or vomiting.  He denies change in appetite, change in bowel movements, or fever.  His last dose of Bactrim was Wednesday morning.  Patient reports he has had paracentesis approximately 7 times in the last couple of months.  He is scheduled for TIPS procedure next week.  Patient has no other complaints at this time.  Patient does not smoke and does not drink.  Patient reports he has advanced directives, that his wife will bring in that paperwork.  He reports he would want to be DNR.  Patient does have left greater than right peripheral edema in the lower extremities.  He reports that that is normal for him to have more swelling on the left than the right. Review of Systems: As mentioned in the history of present illness. All other systems reviewed and are negative. Past Medical History:  Diagnosis Date   AAA (abdominal aortic aneurysm) (HCC)    Arthritis    Basal cell carcinoma    Carotid stenosis    Chronic kidney disease, stage 3b (HCC) 12/13/2021   Coronary artery disease    Multvessel s/p CABG 2015   Enlarged prostate    Essential  hypertension    GERD (gastroesophageal reflux disease)    Gout    History of colon polyps    History of kidney stones    Hyperlipidemia    Lumbar disc disease    Persistent atrial fibrillation (HCC)    Sleep apnea    CPAP   Past Surgical History:  Procedure Laterality Date   ABLATION OF DYSRHYTHMIC FOCUS  07/28/2017   ATRIAL FIBRILLATION ABLATION N/A 07/28/2017   Procedure: ATRIAL FIBRILLATION ABLATION;  Surgeon: Hillis Range, MD;  Location: [facility] INVASIVE CV LAB;  Service: Cardiovascular;  Laterality: N/A;   ATRIAL FIBRILLATION ABLATION N/A 12/04/2020   Procedure: ATRIAL FIBRILLATION ABLATION;  Surgeon: Hillis Range, MD;  Location: [facility] INVASIVE CV LAB;  Service: Cardiovascular;  Laterality: N/A;   BACK SURGERY     CARDIAC CATHETERIZATION  05/22/2014   Procedure: IABP INSERTION;  Surgeon: Marykay Lex, MD;  Location: [facility] CATH LAB;  Service: Cardiovascular;;   CARDIOVERSION N/A 04/29/2017   Procedure: CARDIOVERSION;  Surgeon: Jonelle Sidle, MD;  Location: [facility] ENDO SUITE;  Service: Cardiovascular;  Laterality: N/A;   CARDIOVERSION N/A 08/13/2017   Procedure: CARDIOVERSION;  Surgeon: Chrystie Nose, MD;  Location: [facility] ENDOSCOPY;  Service: Cardiovascular;  Laterality: N/A;   CARDIOVERSION N/A 08/15/2019   Procedure: CARDIOVERSION;  Surgeon: Lars Masson, MD;  Location: [facility] ENDOSCOPY;  Service: Cardiovascular;  Laterality: N/A;   CARDIOVERSION N/A 03/09/2020   Procedure: CARDIOVERSION;  Surgeon: Jodelle Red, MD;  Location: [facility] ENDOSCOPY;  Service: Cardiovascular;  Laterality: N/A;   CARDIOVERSION N/A 07/06/2020   Procedure: CARDIOVERSION;  Surgeon: Thurmon Fair, MD;  Location: [facility] ENDOSCOPY;  Service: Cardiovascular;  Laterality: N/A;   CARDIOVERSION N/A 01/18/2021   Procedure: CARDIOVERSION;  Surgeon: Chilton Si, MD;  Location: [facility] ENDOSCOPY;  Service: Cardiovascular;  Laterality: N/A;   CARDIOVERSION N/A 06/17/2021   Procedure: CARDIOVERSION;  Surgeon: Sande Rives, MD;  Location: [facility] ENDOSCOPY;  Service: Cardiovascular;  Laterality: N/A;   Cataract surgery Right    COLONOSCOPY     COLONOSCOPY N/A 12/30/2017   Procedure: COLONOSCOPY;  Surgeon: Malissa Hippo, MD;  Location: [facility] ENDO SU[facility];  Service: Endoscopy;  Laterality: N/A;   CORONARY ARTERY BYPASS GRAFT N/A 05/22/2014   Procedure: CORONARY ARTERY BYPASS GRAFTING (CABG) times three using left internal mammary and right saphenous vein.;  Surgeon: Loreli Slot, MD;  Location: [facility] OR;  Service: Open Heart Surgery;  Laterality: N/A;   ENDARTERECTOMY Left 02/17/2014   Procedure: ENDARTERECTOMY CAROTID WITH PATCH ANGIOPLASTY;  Surgeon: Pryor Ochoa, MD;  Location: [facility] OR;  Service: Vascular;  Laterality: Left;   ESOPHAGEAL DILATION N/A 08/22/2015   Procedure: ESOPHAGEAL DILATION;  Surgeon: Malissa Hippo, MD;  Location: [facility];  Service: Endoscopy;  Laterality: N/A;   ESOPHAGOGASTRODUODENOSCOPY N/A 08/22/2015   Procedure: ESOPHAGOGASTRODUODENOSCOPY (EGD);  Surgeon: Malissa Hippo, MD;  Location: [facility] ENDO SU[facility];  Service: Endoscopy;  Laterality: N/A;  12:45 - moved to 1:55 - Ann notified pt   ESOPHAGOGASTRODUODENOSCOPY N/A 12/30/2017   Procedure: ESOPHAGOGASTRODUODENOSCOPY (EGD);  Surgeon: Malissa Hippo, MD;  Location: [facility] ENDO SU[facility];  Service: Endoscopy;  Laterality: N/A;  200   EYE SURGERY     cataract extraction (right) with repair macular tear , with IOL     right   FRACTURE SURGERY     bilateral wrist fractures- one ORIF   IR RADIOLOGIST EVAL & MGMT  12/01/2022   JOINT REPLACEMENT  2011   left knee   LEFT HEART CATHETERIZATION WITH CORONARY ANGIOGRAM N/A 05/22/2014   Procedure: LEFT HEART CATHETERIZATION WITH CORONARY ANGIOGRAM;  Surgeon: Marykay Lex, MD;  Location: [facility] CATH LAB;  Service: Cardiovascular;  Laterality: N/A;   LITHOTRIPSY     PARS PLANA VITRECTOMY W/ REPAIR OF MACULAR HOLE     RHINOPLASTY     TEE WITHOUT CARDIOVERSION N/A 04/29/2017   Procedure: TRANSESOPHAGEAL  ECHOCARDIOGRAM (TEE) WITH PROPOFOL;  Surgeon: Jonelle Sidle, MD;  Location: [facility] ENDO SUITE;  Service: Cardiovascular;  Laterality: N/A;   TONSILLECTOMY     TOTAL HIP ARTHROPLASTY  12/08/2011   Procedure: TOTAL HIP ARTHROPLASTY;  Surgeon: Loanne Drilling, MD;  Location: [facility] ORS;  Service: Orthopedics;  Laterality: Right;   UPPER GI ENDOSCOPY  12/18/2015   Procedure: UPPER GI ENDOSCOPY;  Surgeon: Axel Filler, MD;  Location: [facility] ORS;  Service: General;;   WRIST SURGERY Right 60yrs ago   WRIST SURGERY Left    Social History:  reports that he quit smoking about 40 years ago. His smoking use included cigarettes. He started smoking about 62 years ago. He has a 37.5 pack-year smoking history. He has never been exposed to tobacco smoke. He has never used smokeless tobacco. He reports that he does not drink alcohol and does not use drugs.  Allergies  Allergen Reactions   Codeine Sulfate Nausea Only    Family History  Problem Relation Age of Onset   Allergies Mother    Heart  disease Mother    Hypertension Mother    Heart disease Father        MI    Prior to Admission medications   Medication Sig Start Date End Date Taking? Authorizing Provider  acetaminophen (TYLENOL) 500 MG tablet Take 1,000 mg by mouth every 6 (six) hours as needed for moderate pain.   Yes [provider]  amiodarone (PACERONE) 200 MG tablet Take 1 tablet (200 mg total) by mouth daily. 10/24/22  Yes Jake Bathe, MD  atorvastatin (LIPITOR) 80 MG tablet TAKE 1 TABLET BY MOUTH EVERY DAY IN THE EVENING 04/03/22  Yes Jake Bathe, MD  Cholecalciferol (VITAMIN D) 50 MCG (2000 UT) CAPS Take 2,000 Units by mouth daily.   Yes [provider]  cyanocobalamin (VITAMIN B12) 1000 MCG tablet Take 1,000 mcg by mouth See admin instructions. 2-3 TIMES WEEKLY PER PT'S WIFE   Yes [provider]  doxazosin (CARDURA) 4 MG tablet Take 4 mg by mouth in the morning. 07/06/17  Yes [provider]   empagliflozin (JARDIANCE) 10 MG TABS tablet Take 1 tablet (10 mg total) by mouth daily before breakfast. 06/11/22  Yes Jake Bathe, MD  feeding supplement (BOOST HIGH PROTEIN) LIQD Take 1 Container by mouth at bedtime. One shake before bedtime.   Yes [provider]  finasteride (PROSCAR) 5 MG tablet Take 5 mg by mouth every evening.    Yes [provider]  fluticasone (FLONASE) 50 MCG/ACT nasal spray Place 1 spray into both nostrils daily as needed for allergies or rhinitis.   Yes [provider]  furosemide (LASIX) 80 MG tablet Take 1 tablet (80 mg total) by mouth 2 (two) times daily. 06/11/22  Yes Jake Bathe, MD  lactulose (CHRONULAC) 10 GM/15ML solution Take 30 mLs (20 g total) by mouth 2 (two) times daily. Can increase to twice a day depending on constipation improvement Patient taking differently: Take 20 g by mouth 3 (three) times daily. 12/22/22  Yes Carlan, Chelsea L, NP  nitroGLYCERIN (NITROSTAT) 0.4 MG SL tablet Place 1 tablet (0.4 mg total) under the tongue every 5 (five) minutes x 3 doses as needed for chest pain. 06/04/20  Yes Allred, Fayrene Fearing, MD  pantoprazole (PROTONIX) 40 MG tablet Take 1 tablet (40 mg total) by mouth daily before breakfast. 02/19/16  Yes Rehman, Joline Maxcy, MD  Rivaroxaban (XARELTO) 15 MG TABS tablet Take 1 tablet (15 mg total) by mouth daily with supper. 12/16/21  Yes Tat, Onalee Hua, MD  spironolactone (ALDACTONE) 25 MG tablet Take 1 tablet (25 mg total) by mouth daily. 11/10/22  Yes Dolores Frame, MD  sulfamethoxazole-trimethoprim (BACTRIM DS) 800-160 MG tablet Take 1 tablet by mouth daily. 01/08/23  Yes Dolores Frame, MD    Physical Exam: Vitals:   01/14/23 2214 01/15/23 0153 01/15/23 0303 01/15/23 0358  BP:    (!) 110/56  Pulse:    (!) 49  Resp:    17  Temp: 98.5 F (36.9 C)   98 F (36.7 C)  TempSrc: Oral   Oral  SpO2:  97%  98%  Weight:   78 kg   Height:   5\' 9"  (1.753 m)    1.  General: Patient lying  supine in bed,  no acute distress   2. Psychiatric: Alert and oriented x 3, mood and behavior normal for situation, pleasant and cooperative with exam   3. Neurologic: Speech and language are normal, face is symmetric, moves all 4 extremities voluntarily, at baseline  without acute deficits on limited exam   4. HEENMT:  Head is atraumatic, normocephalic, pupils reactive to light, neck is supple, trachea is midline, mucous membranes are moist   5. Respiratory : Lungs are clear to auscultation bilaterally without wheezing, rhonchi, rales, no cyanosis, no increase in work of breathing or accessory muscle use   6. Cardiovascular : Heart rate normal, rhythm is regular, no murmurs, rubs or gallops, left greater than right peripheral edema, peripheral pulses palpated   7. Gastrointestinal:  Abdomen is soft, fluid wave, minimal tenderness to palpation, bowel sounds active, no masses or organomegaly palpated   8. Skin:  Skin is warm, dry and intact without rashes, acute lesions, or ulcers on limited exam   9.Musculoskeletal:  No acute deformities or trauma, no asymmetry in tone, left greater than right peripheral edema, peripheral pulses palpated, no tenderness to palpation in the extremities  Data Reviewed: In the ED No leukocytosis, hemoglobin stable Chemistry reveals an elevated creatinine at 2.03 which is up from 1.63 UA is not indicative of UTI EKG shows heart rate of 52 and a QTc of 403 Rocephin started GI consulted and to see patient in the a.m.  Assessment and Plan: * Spontaneous bacterial peritonitis (HCC) -Continue Rocephin -Recently treated with bactrim -Body fluid culture pending -Blood cultures pending -No leukocytosis -Continue to monitor  Hepatic cirrhosis (HCC) -GI to see in the AM -continue lactulose and aldactone -CMP and Ammonia in the AM  HLD (hyperlipidemia) -Continue statin  Persistent atrial fibrillation (HCC) -Continue amiodarone and xarelto   AKI  (acute kidney injury) (HCC) -Cr baseline 1.63 >> 2.03 -Hold nephrotoxic agents when possible -Likely hepatorenal syndrome - with ascites >> recently 5L off -Continue to monitor  HTN (hypertension) -Continue cardura, proscar -Continue aldactone       Advance Care Planning:   Code Status: DNR  Consults: GI consulted from the ED  Family Communication: No family at bedside  Severity of Illness: The appropriate patient status for this patient is INPATIENT. Inpatient status is judged to be reasonable and necessary in order to provide the required intensity of service to ensure the patient's safety. The patient's presenting symptoms, physical exam findings, and initial radiographic and laboratory data in the context of their chronic comorbidities is felt to place them at high risk for further clinical deterioration. Furthermore, it is not anticipated that the patient will be medically stable for discharge from the hospital within 2 midnights of admission.   * I certify that at the point of admission it is my clinical judgment that the patient will require inpatient hospital care spanning beyond 2 midnights from the point of admission due to high intensity of service, high risk for further deterioration and high frequency of surveillance required.*  Author: Lilyan Gilford, DO 01/15/2023 5:18 AM  For on call review [email or web address].

## 2023-01-15 NOTE — Plan of Care (Signed)

## 2023-01-15 NOTE — Assessment & Plan Note (Signed)
-  Continue cardura, proscar -Continue aldactone

## 2023-01-15 NOTE — Assessment & Plan Note (Addendum)
-   continue lactulose and spironolactone; Lasix has been resumed as well -Monitoring renal function as well -TIPS delayed and plan is for repeat MELD score in 3 to 4 weeks

## 2023-01-15 NOTE — Assessment & Plan Note (Addendum)
-   s/p ablation 2019 & 2022 followed by DCCV 06/2021 -Continue amiodarone and xarelto

## 2023-01-15 NOTE — Hospital Course (Addendum)
Mr. Ariano is an 82 yo male with PMH cirrhosis, CAD s/p CABG 2015, CKD3b, AAA, arthritis, HTN, gout, GERD, HLD, persistent A-fib s/p ablation 2019 & 2022 followed by DCCV 06/2021 due to recurrence again, sleep apnea.  He has been followed by GI and cardiology closely. He has started to undergo paracenteses that started on 11/12/2022. He underwent 7th paracentesis on 01/14/23. He is scheduled for a TIPS on 01/21/23 as well.  He was diagnosed with SBP after paracentesis on 12/26/22; treated with rocephin and discharged on vantin to complete course.  This was to be followed by Cipro for SBP prophylaxis thereafter. However, due to concurrent amiodarone use there was concern for QTc prolongation and he was changed to daily Bactrim DS which his wife states he'd had 4 days of prior to this admission.  He then underwent paracentesis once again on 01/14/2023.  Cell count was again consistent with SBP and he was admitted for antibiotics and further workup.  He completed 5-day course of Rocephin for SBP.  Culture remained negative. He was followed by GI during hospitalization at well.  He has plans for repeat paracentesis on 01/19/2023.  Outpatient TIPS procedure has been postponed due to SBP and hospitalization.

## 2023-01-15 NOTE — Assessment & Plan Note (Addendum)
-   patient has history of CKD3b. Baseline creat ~ 1.8 - 1.9, eGFR~ 35 - creat 2.03 on admission; not consistent with true acute worsening but does have potential in setting of aggressive diuretic regimen, routine paracentesis, and bactrim use - if able to get TIPS, then that would reduce paracentesis dependency, but would benefit from intermittent BMP monitoring

## 2023-01-15 NOTE — Assessment & Plan Note (Addendum)
-   repeat paracentesis (7th since 6/12) performed on 8/14 - PMN count 360 consistent with SBP; patient seemingly asymptomatic both times now - restarted back on rocephin; was also started on bactrim outpatient for ppx - follow up culture results; remain negative - GI recommending finishing 5-day course inpatient; completion date, 01/18/2023 -Bactrim to be continued on discharge for prophylaxis

## 2023-01-15 NOTE — Consult Note (Signed)
Gastroenterology Consult   Referring Provider: No ref. provider found Primary Care Physician:  Carylon Perches, MD Primary Gastroenterologist:  Katrinka Blazing, MD  Patient ID: Caleb Wolf; [id number]; 1940-08-12   Admit date: 01/14/2023  LOS: 1 day   Date of Consultation: 01/15/2023  Reason for Consultation:  SBP    History of Present Illness   Caleb Wolf is a 82 y.o. male admitted with SBP. 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 On 7/25, he had his fifth paracentesis and cell count returned with PMN >250. Cultures were negative. He was admitted to receive IV antibiotics and discharged on 12/29/22. He completed Vantin as outpatient and started Cipro for prophylaxis.  Cardiology reached out with concerns for use of Cipro given history of amiodarone use and risk of prolonging his QTc.  He therefore was started on Bactrim DS daily on January 08, 2023.  He is on lactulose for constipation.  No prior history of hepatic encephalopathy.  As an outpatient has been on lasix 80mg  BID and spironolactone 25mg  daily.  Currently scheduled for TIPS procedure January 21, 2023.   Had paracentesis yesterday (5 liters) with cell count compared to 7/26 showing increased TNCC from 1025 to 1202 and PMN persistently >250. Dr. Levon Hedger did not suspect Bactrim failure as he had only been on it for four days. No organisms seen on gram stain, culture pending.  Advised to present for admission for IV antibiotics.  In ED, Na 134, K 3.6, BUN 39, Cre 2.03 (up from 1.63), alb 3.3, LFTs normal. WBC 7900, Hgb 9.8 stable, platelets 285000.  Today: ammonia 14, Cre 1.97, Na 135, WBC 9.2, Hgb 9.3, Platelets  287000.  Today: Patient denies any abdominal pain, nausea or vomiting, fever.  States he feels fine.  Reminds [place] that he did not feel sick when he had SBP last time.  Appetite has been good.  He would like a tray now.  Wife states that he has had a little bit of issues recently with orientation to time but otherwise no confusion.  No tremors.  Continues to have issues with constipation, currently taking lactulose 20g 3 times daily but has not had a bowel movement in 2 days.  Passing gas.  Cirrhosis related questions: Hematemesis/coffee ground emesis: No Abdominal pain: No Abdominal distention/worsening ascites: Yes Fever/chills: No Episodes of confusion/disorientation: To timeline Number of daily bowel movements:every 3-4 days Taking diuretics?: Yes, lasix 80 mg BID, aldactone 25mg  daily History of variceal bleeding: No Prior history of banding?: No Prior episodes of SBP: yes Last time liver imaging was performed: 12/24/22 via CT Last AFP: 2.8 on 11/18/22 MELD score: could not be calculated Currently consuming alcohol: No   Last EGD: 12/30/2017,mid esophagus was dilated,Partial fundal wrap, presence of lipoma in the second portion of the duodenum. Last Colonoscopy: 12/30/2017, small polyps in the cecum which were removed biopsy, diverticulosis and external hemorrhoids. Pathology consistent with 1 tubular adenoma. Recommended repeating a colonoscopy in 7 years       Prior to Admission medications   Medication Sig Start Date End Date Taking? Authorizing Provider  acetaminophen (TYLENOL) 500 MG tablet Take 1,000 mg by mouth every 6 (six) hours as needed for moderate pain.   Yes [provider]  amiodarone (PACERONE) 200 MG tablet Take 1 tablet (200 mg total) by mouth daily. 10/24/22  Yes Jake Bathe, MD  atorvastatin (LIPITOR) 80 MG tablet TAKE 1 TABLET BY MOUTH EVERY DAY IN THE EVENING 04/03/22  Yes Jake Bathe, MD  Cholecalciferol (VITAMIN D) 50 MCG (2000 UT) CAPS Take  2,000 Units by mouth daily.   Yes [provider]  cyanocobalamin (VITAMIN B12) 1000 MCG tablet Take 1,000 mcg by mouth See admin instructions. 2-3 TIMES WEEKLY PER PT'S WIFE   Yes [provider]  doxazosin (CARDURA) 4 MG tablet Take 4 mg by mouth in the morning. 07/06/17  Yes [provider]  empagliflozin (JARDIANCE) 10 MG TABS tablet Take 1 tablet (10 mg total) by mouth daily before breakfast. 06/11/22  Yes Jake Bathe, MD  feeding supplement (BOOST HIGH PROTEIN) LIQD Take 1 Container by mouth at bedtime. One shake before bedtime.   Yes [provider]  finasteride (PROSCAR) 5 MG tablet Take 5 mg by mouth every evening.    Yes [provider]  fluticasone (FLONASE) 50 MCG/ACT nasal spray Place 1 spray into both nostrils daily as needed for allergies or rhinitis.   Yes [provider]  furosemide (LASIX) 80 MG tablet Take 1 tablet (80 mg total) by mouth 2 (two) times daily. 06/11/22  Yes Jake Bathe, MD  lactulose (CHRONULAC) 10 GM/15ML solution Take 30 mLs (20 g total) by mouth 2 (two) times daily. Can increase to twice a day depending on constipation improvement Patient taking differently: Take 20 g by mouth 3 (three) times daily. 12/22/22  Yes Carlan, Chelsea L, NP  nitroGLYCERIN (NITROSTAT) 0.4 MG SL tablet Place 1 tablet (0.4 mg total) under the tongue every 5 (five) minutes x 3 doses as needed for chest pain. 06/04/20  Yes Allred, Fayrene Fearing, MD  pantoprazole (PROTONIX) 40 MG tablet Take 1 tablet (40 mg total) by mouth daily before breakfast. 02/19/16  Yes Rehman, Joline Maxcy, MD  Rivaroxaban (XARELTO) 15 MG TABS tablet Take 1 tablet (15 mg total) by mouth daily with supper. 12/16/21  Yes Tat, Onalee Hua, MD  spironolactone (ALDACTONE) 25 MG tablet Take 1 tablet (25 mg total) by mouth daily. 11/10/22  Yes Dolores Frame, MD  sulfamethoxazole-trimethoprim (BACTRIM DS) 800-160 MG tablet Take 1 tablet by mouth daily. 01/08/23  Yes Dolores Frame, MD    Current Facility-Administered Medications  Medication Dose Route Frequency Provider Last Rate Last Admin   acetaminophen (TYLENOL) tablet 650 mg  650 mg Oral Q6H PRN Zierle-Ghosh, Asia B, DO       Or   acetaminophen (TYLENOL) suppository 650 mg  650 mg Rectal Q6H PRN Zierle-Ghosh, Asia B, DO       amiodarone (PACERONE) tablet 200 mg  200 mg Oral Daily Zierle-Ghosh, Asia B, DO   200 mg at 01/15/23 0912   atorvastatin (LIPITOR) tablet 80 mg  80 mg Oral QHS Zierle-Ghosh, Asia B, DO   80 mg at 01/14/23 2217   cefTRIAXone (ROCEPHIN) 2 g in sodium chloride 0.9 % 100 mL IVPB  2 g Intravenous Q24H Zierle-Ghosh, Asia B, DO       doxazosin (CARDURA) tablet 4 mg  4 mg Oral Daily Zierle-Ghosh, Asia B, DO       finasteride (PROSCAR) tablet 5 mg  5 mg Oral QPM Zierle-Ghosh, Asia B, DO   5 mg at 01/14/23 2217   lactulose (CHRONULAC) 10 GM/15ML solution 20 g  20 g Oral BID PRN Zierle-Ghosh,  Asia B, DO       morphine (PF) 2 MG/ML injection 2 mg  2 mg Intravenous Q2H PRN Zierle-Ghosh, Asia B, DO       ondansetron (ZOFRAN) tablet 4 mg  4 mg Oral Q6H PRN Zierle-Ghosh, Asia B, DO       Or   ondansetron (ZOFRAN) injection 4 mg  4 mg Intravenous Q6H PRN Zierle-Ghosh, Asia B, DO       oxyCODONE (Oxy IR/ROXICODONE) immediate release tablet 5 mg  5 mg Oral Q4H PRN Zierle-Ghosh, Asia B, DO       pantoprazole (PROTONIX) EC tablet 40 mg  40 mg Oral QAC breakfast Zierle-Ghosh, Asia B, DO   40 mg at 01/15/23 8119   Rivaroxaban (XARELTO) tablet 15 mg  15 mg Oral Q supper Zierle-Ghosh, Asia B, DO       spironolactone (ALDACTONE) tablet 25 mg  25 mg Oral Daily Zierle-Ghosh, Asia B, DO   25 mg at 01/15/23 0912   Current Outpatient Medications  Medication Sig Dispense Refill   acetaminophen (TYLENOL) 500 MG tablet Take 1,000 mg by mouth every 6 (six) hours as needed for moderate pain.     amiodarone (PACERONE) 200 MG tablet Take 1 tablet (200 mg total) by mouth daily. 90 tablet 1   atorvastatin (LIPITOR) 80 MG  tablet TAKE 1 TABLET BY MOUTH EVERY DAY IN THE EVENING 90 tablet 3   Cholecalciferol (VITAMIN D) 50 MCG (2000 UT) CAPS Take 2,000 Units by mouth daily.     cyanocobalamin (VITAMIN B12) 1000 MCG tablet Take 1,000 mcg by mouth See admin instructions. 2-3 TIMES WEEKLY PER PT'S WIFE     doxazosin (CARDURA) 4 MG tablet Take 4 mg by mouth in the morning.  4   empagliflozin (JARDIANCE) 10 MG TABS tablet Take 1 tablet (10 mg total) by mouth daily before breakfast. 30 tablet 11   feeding supplement (BOOST HIGH PROTEIN) LIQD Take 1 Container by mouth at bedtime. One shake before bedtime.     finasteride (PROSCAR) 5 MG tablet Take 5 mg by mouth every evening.      fluticasone (FLONASE) 50 MCG/ACT nasal spray Place 1 spray into both nostrils daily as needed for allergies or rhinitis.     furosemide (LASIX) 80 MG tablet Take 1 tablet (80 mg total) by mouth 2 (two) times daily. 180 tablet 3   lactulose (CHRONULAC) 10 GM/15ML solution Take 30 mLs (20 g total) by mouth 2 (two) times daily. Can increase to twice a day depending on constipation improvement (Patient taking differently: Take 20 g by mouth 3 (three) times daily.) 1892 mL 2   nitroGLYCERIN (NITROSTAT) 0.4 MG SL tablet Place 1 tablet (0.4 mg total) under the tongue every 5 (five) minutes x 3 doses as needed for chest pain. 75 tablet 3   pantoprazole (PROTONIX) 40 MG tablet Take 1 tablet (40 mg total) by mouth daily before breakfast. 90 tablet 3   Rivaroxaban (XARELTO) 15 MG TABS tablet Take 1 tablet (15 mg total) by mouth daily with supper. 30 tablet 1   spironolactone (ALDACTONE) 25 MG tablet Take 1 tablet (25 mg total) by mouth daily. 90 tablet 0   sulfamethoxazole-trimethoprim (BACTRIM DS) 800-160 MG tablet Take 1 tablet by mouth daily. 90 tablet 3    Allergies as of 01/14/2023 - Review Complete 01/14/2023  Allergen Reaction Noted   Codeine sulfate Nausea Only 12/01/2011    Past Medical History:  Diagnosis Date   AAA (abdominal aortic aneurysm)  (HCC)  Arthritis    Basal cell carcinoma    Carotid stenosis    Chronic kidney disease, stage 3b (HCC) 12/13/2021   Coronary artery disease    Multvessel s/p CABG 2015   Enlarged prostate    Essential hypertension    GERD (gastroesophageal reflux disease)    Gout    History of colon polyps    History of kidney stones    Hyperlipidemia    Lumbar disc disease    Persistent atrial fibrillation (HCC)    Sleep apnea    CPAP    Past Surgical History:  Procedure Laterality Date   ABLATION OF DYSRHYTHMIC FOCUS  07/28/2017   ATRIAL FIBRILLATION ABLATION N/A 07/28/2017   Procedure: ATRIAL FIBRILLATION ABLATION;  Surgeon: Hillis Range, MD;  Location: [facility] INVASIVE CV LAB;  Service: Cardiovascular;  Laterality: N/A;   ATRIAL FIBRILLATION ABLATION N/A 12/04/2020   Procedure: ATRIAL FIBRILLATION ABLATION;  Surgeon: Hillis Range, MD;  Location: [facility] INVASIVE CV LAB;  Service: Cardiovascular;  Laterality: N/A;   BACK SURGERY     CARDIAC CATHETERIZATION  05/22/2014   Procedure: IABP INSERTION;  Surgeon: Marykay Lex, MD;  Location: [facility] CATH LAB;  Service: Cardiovascular;;   CARDIOVERSION N/A 04/29/2017   Procedure: CARDIOVERSION;  Surgeon: Jonelle Sidle, MD;  Location: [facility] ENDO SUITE;  Service: Cardiovascular;  Laterality: N/A;   CARDIOVERSION N/A 08/13/2017   Procedure: CARDIOVERSION;  Surgeon: Chrystie Nose, MD;  Location: [facility] ENDOSCOPY;  Service: Cardiovascular;  Laterality: N/A;   CARDIOVERSION N/A 08/15/2019   Procedure: CARDIOVERSION;  Surgeon: Lars Masson, MD;  Location: [facility] ENDOSCOPY;  Service: Cardiovascular;  Laterality: N/A;   CARDIOVERSION N/A 03/09/2020   Procedure: CARDIOVERSION;  Surgeon: Jodelle Red, MD;  Location: [facility] ENDOSCOPY;  Service: Cardiovascular;  Laterality: N/A;   CARDIOVERSION N/A 07/06/2020   Procedure: CARDIOVERSION;  Surgeon: Thurmon Fair, MD;  Location: [facility] ENDOSCOPY;  Service: Cardiovascular;  Laterality: N/A;   CARDIOVERSION N/A 01/18/2021    Procedure: CARDIOVERSION;  Surgeon: Chilton Si, MD;  Location: [facility] ENDOSCOPY;  Service: Cardiovascular;  Laterality: N/A;   CARDIOVERSION N/A 06/17/2021   Procedure: CARDIOVERSION;  Surgeon: Sande Rives, MD;  Location: [facility] ENDOSCOPY;  Service: Cardiovascular;  Laterality: N/A;   Cataract surgery Right    COLONOSCOPY     COLONOSCOPY N/A 12/30/2017   Procedure: COLONOSCOPY;  Surgeon: Malissa Hippo, MD;  Location: [facility] ENDO SU[facility];  Service: Endoscopy;  Laterality: N/A;   CORONARY ARTERY BYPASS GRAFT N/A 05/22/2014   Procedure: CORONARY ARTERY BYPASS GRAFTING (CABG) times three using left internal mammary and right saphenous vein.;  Surgeon: Loreli Slot, MD;  Location[facility] OR;  Service: Open Heart Surgery;  Laterality: N/A;   ENDARTERECTOMY Left 02/17/2014   Procedure: ENDARTERECTOMY CAROTID WITH PATCH ANGIOPLASTY;  Surgeon: Pryor Ochoa, MD;  Location: [facility] OR;  Service: Vascular;  Laterality: Left;   ESOPHAGEAL DILATION N/A 08/22/2015   Procedure: ESOPHAGEAL DILATION;  Surgeon: Malissa Hippo, MD;  Location: [facility];  Service: Endoscopy;  Laterality: N/A;   ESOPHAGOGASTRODUODENOSCOPY N/A 08/22/2015   Procedure: ESOPHAGOGASTRODUODENOSCOPY (EGD);  Surgeon: Malissa Hippo, MD;  Location: [facility] ENDO SUITE;  Service: Endoscopy;  Laterality: N/A;  12:45 - moved to 1:55 - Ann notified pt   ESOPHAGOGASTRODUODENOSCOPY N/A 12/30/2017   Procedure: ESOPHAGOGASTRODUODENOSCOPY (EGD);  Surgeon: Malissa Hippo, MD;  Location: [facility];  Service: Endoscopy;  Laterality: N/A;  200   EYE SURGERY     cataract extraction (right) with repair macular tear , with IOL  right   FRACTURE SURGERY     bilateral wrist fractures- one ORIF   IR RADIOLOGIST EVAL & MGMT  12/01/2022   JOINT REPLACEMENT  2011   left knee   LEFT HEART CATHETERIZATION WITH CORONARY ANGIOGRAM N/A 05/22/2014   Procedure: LEFT HEART CATHETERIZATION WITH CORONARY ANGIOGRAM;  Surgeon: Marykay Lex, MD;  Location: [facility]  CATH LAB;  Service: Cardiovascular;  Laterality: N/A;   LITHOTRIPSY     PARS PLANA VITRECTOMY W/ REPAIR OF MACULAR HOLE     RHINOPLASTY     TEE WITHOUT CARDIOVERSION N/A 04/29/2017   Procedure: TRANSESOPHAGEAL ECHOCARDIOGRAM (TEE) WITH PROPOFOL;  Surgeon: Jonelle Sidle, MD;  Location: [facility] ENDO SUITE;  Service: Cardiovascular;  Laterality: N/A;   TONSILLECTOMY     TOTAL HIP ARTHROPLASTY  12/08/2011   Procedure: TOTAL HIP ARTHROPLASTY;  Surgeon: Loanne Drilling, MD;  Location: [facility] ORS;  Service: Orthopedics;  Laterality: Right;   UPPER GI ENDOSCOPY  12/18/2015   Procedure: UPPER GI ENDOSCOPY;  Surgeon: Axel Filler, MD;  Location: [facility] ORS;  Service: General;;   WRIST SURGERY Right 44yrs ago   WRIST SURGERY Left     Family History  Problem Relation Age of Onset   Allergies Mother    Heart disease Mother    Hypertension Mother    Heart disease Father        MI    Social History   Socioeconomic History   Marital status: Married    Spouse name: Not on file   Number of children: Not on file   Years of education: Not on file   Highest education level: Not on file  Occupational History   Occupation: retired    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  Tobacco Use   Smoking status: Former    Current packs/day: 0.00    Average packs/day: 1.5 packs/day for 25.0 years (37.5 ttl pk-yrs)    Types: Cigarettes    Start date: 08/08/1960    Quit date: 06/02/1982    Years since quitting: 40.6    Passive exposure: Never   Smokeless tobacco: Never   Tobacco comments:    quit smoking 30+yrs ago  Vaping Use   Vaping status: Never Used  Substance and Sexual Activity   Alcohol use: No    Alcohol/week: 0.0 standard drinks of alcohol    Comment: occasionally    Drug use: No   Sexual activity: Yes  Other Topics Concern   Not on file  Social History Narrative   Not on file   Social Determinants of Health   Financial Resource Strain: Not on file  Food Insecurity: No Food Insecurity (01/14/2023)    Hunger Vital Sign    Worried About Running Out of Food in the Last Year: Never true    Ran Out of Food in the Last Year: Never true  Transportation Needs: No Transportation Needs (01/14/2023)   PRAPARE - Administrator, Civil Service (Medical): No    Lack of Transportation (Non-Medical): No  Physical Activity: Not on file  Stress: Not on file  Social Connections: Not on file  Intimate Partner Violence: Not At Risk (01/14/2023)   Humiliation, Afraid, Rape, and Kick questionnaire    Fear of Current or Ex-Partner: No    Emotionally Abused: No    Physically Abused: No    Sexually Abused: No     Review of System:   General: Negative for anorexia, weight loss, fever, chills, fatigue, weakness. Eyes: Negative for vision changes.  ENT:  Negative for hoarseness, difficulty swallowing , nasal congestion. CV: Negative for chest pain, angina, palpitations, dyspnea on exertion, +peripheral edema.  Respiratory: Negative for dyspnea at rest, dyspnea on exertion, cough, sputum, wheezing.  GI: See history of present illness. GU:  Negative for dysuria, hematuria, urinary incontinence, urinary frequency, nocturnal urination.  MS: Negative for joint pain, low back pain.  Derm: Negative for rash or itching.  Neuro: Negative for weakness, abnormal sensation, seizure, frequent headaches, see hpi Psych: Negative for anxiety, depression, suicidal ideation, hallucinations.  Endo: Negative for unusual weight change.  Heme: Negative for bruising or bleeding. Allergy: Negative for rash or hives.      Physical Examination:   Vital signs in last 24 hours: Temp:  [98 F (36.7 C)-98.5 F (36.9 C)] 98 F (36.7 C) (08/15 0358) Pulse Rate:  [44-59] 49 (08/15 0358) Resp:  [14-21] 17 (08/15 0358) BP: (110-164)/(46-90) 110/56 (08/15 0358) SpO2:  [96 %-100 %] 98 % (08/15 0358) Weight:  [78 kg] 78 kg (08/15 0303) Last BM Date : 01/14/23  General: Well-nourished, well-developed in no acute  distress.  Head: Normocephalic, atraumatic.   Eyes: Conjunctiva pink, no icterus. Mouth: Oropharyngeal mucosa moist and pink  Neck: Supple without thyromegaly, masses, or lymphadenopathy.  Lungs: Clear to auscultation bilaterally.  Heart: Regular rate and rhythm, no murmurs rubs or gallops.  Abdomen: Bowel sounds are normal, nontender, no hepatosplenomegaly or masses, no abdominal bruits, no rebound or guarding.  Slightly distended, very soft. Umb hernia nontender, not protruding Rectal: not performed Extremities: 1+ bilateral lower extremity edema, clubbing, deformity.  Neuro: Alert and oriented x 4 , grossly normal neurologically.  Negative asterixis Skin: Warm and dry, no rash or jaundice.   Psych: Alert and cooperative, normal mood and affect.        Intake/Output from previous day: 08/14 0701 - 08/15 0700 In: -  Out: 300 [Urine:300] Intake/Output this shift: No intake/output data recorded.  Lab Results:   CBC Recent Labs    01/14/23 1701 01/15/23 0513  WBC 7.9 9.2  HGB 9.8* 9.3*  HCT 31.3* 29.6*  MCV 90.5 88.9  PLT 285 287   BMET Recent Labs    01/14/23 1701 01/15/23 0513  NA 134* 135  K 3.6 3.9  CL 98 101  CO2 27 26  GLUCOSE 112* 99  BUN 39* 38*  CREATININE 2.03* 1.97*  CALCIUM 8.0* 8.0*   LFT Recent Labs    01/14/23 1701 01/15/23 0513  BILITOT 0.4 0.3  ALKPHOS 63 61  AST 25 19  ALT 33 30  PROT 6.0* 5.4*  ALBUMIN 3.3* 2.8*    Lipase No results for input(s): "LIPASE" in the last 72 hours.  PT/INR No results for input(s): "LABPROT", "INR" in the last 72 hours.   Hepatitis Panel No results for input(s): "HEPBSAG", "HCVAB", "HEPAIGM", "HEPBIGM" in the last 72 hours.   Imaging Studies:   US Paracentesis  Result Date: 01/14/2023 INDICATION: Recurrent ascites EXAM: ULTRASOUND GUIDED  PARACENTESIS MEDICATIONS: None. COMPLICATIONS: None immediate. PROCEDURE: Informed written consent was obtained from the patient after a discussion of the risks,  benefits and alternatives to treatment. A timeout was performed prior to the initiation of the procedure. Initial ultrasound scanning demonstrates a large amount of ascites within the right lower abdominal quadrant. The right lower abdomen was prepped and draped in the usual sterile fashion. 1% lidocaine was used for local anesthesia. Following this, a 8 Fr Safe-T-Centesis catheter was introduced. An ultrasound image was saved for documentation purposes. The paracentesis was  performed. The catheter was removed and a dressing was applied. The patient tolerated the procedure well without immediate post procedural complication. FINDINGS: A total of approximately 5 L of straw-colored fluid was removed. IMPRESSION: Successful ultrasound-guided paracentesis yielding 5 liters of peritoneal fluid. Electronically Signed   By: Acquanetta Belling [name]   On: 01/14/2023 11:40   ECHOCARDIOGRAM COMPLETE  Result Date: 12/31/2022    ECHOCARDIOGRAM REPORT   Patient Name:   Caleb Wolf Date of Exam: 12/31/2022 Medical Rec #:  [id number]    Height:       69.0 in Accession #:    [id number]   Weight:       175.7 lb Date of Birth:  Oct 30, 1940     BSA:          1.955 m Patient Age:    82 years     BP:           110/50 mmHg Patient Gender: M            HR:           58 bpm. Exam Location:  Outpatient Procedure: 2D Echo, 3D Echo, Color Doppler, Cardiac Doppler and Strain Analysis Indications:    Preoperative Evaluation  History:        Patient has prior history of Echocardiogram examinations, most                 recent 12/11/2021. Previous Myocardial Infarction and CAD, Prior                 CABG and Abnormal ECG, Arrythmias:Atrial Fibrillation; Risk                 Factors:Hypertension, Former Smoker and Dyslipidemia.  Sonographer:    Jeryl Columbia RDCS Referring Phys: [id number] JON MUGWERU IMPRESSIONS  1. Left ventricular ejection fraction, by estimation, is 45 to 50%. The left ventricle has mildly decreased function. The left ventricle  demonstrates regional wall motion abnormalities (see scoring diagram/findings for description). Left ventricular diastolic parameters are indeterminate.  2. Right ventricular systolic function is normal. The right ventricular size is normal. There is mildly elevated pulmonary artery systolic pressure. The estimated right ventricular systolic pressure is 42.4 mmHg.  3. Left atrial size was severely dilated.  4. Right atrial size was mildly dilated.  5. The mitral valve is normal in structure. Trivial mitral valve regurgitation.  6. The aortic valve is tricuspid. Aortic valve regurgitation is not visualized. Aortic valve sclerosis/calcification is present, without any evidence of aortic stenosis.  7. The inferior vena cava is normal in size with greater than 50% respiratory variability, suggesting right atrial pressure of 3 mmHg. FINDINGS  Left Ventricle: Left ventricular ejection fraction, by estimation, is 45 to 50%. The left ventricle has mildly decreased function. The left ventricle demonstrates regional wall motion abnormalities. The left ventricular internal cavity size was normal in size. There is no left ventricular hypertrophy. Left ventricular diastolic parameters are indeterminate.  LV Wall Scoring: The entire septum is hypokinetic. The entire anterior wall, entire lateral wall, entire inferior wall, and apex are normal. Right Ventricle: The right ventricular size is normal. No increase in right ventricular wall thickness. Right ventricular systolic function is normal. There is mildly elevated pulmonary artery systolic pressure. The tricuspid regurgitant velocity is 3.14  m/s, and with an assumed right atrial pressure of 3 mmHg, the estimated right ventricular systolic pressure is 42.4 mmHg. Left Atrium: Left atrial size was severely dilated. Right Atrium: Right atrial size was  mildly dilated. Pericardium: There is no evidence of pericardial effusion. Mitral Valve: The mitral valve is normal in structure.  Trivial mitral valve regurgitation. Tricuspid Valve: The tricuspid valve is normal in structure. Tricuspid valve regurgitation is trivial. Aortic Valve: The aortic valve is tricuspid. Aortic valve regurgitation is not visualized. Aortic valve sclerosis/calcification is present, without any evidence of aortic stenosis. Pulmonic Valve: The pulmonic valve was not well visualized. Pulmonic valve regurgitation is mild. Aorta: The aortic root and ascending aorta are structurally normal, with no evidence of dilitation. Venous: The inferior vena cava is normal in size with greater than 50% respiratory variability, suggesting right atrial pressure of 3 mmHg. IAS/Shunts: The interatrial septum was not well visualized.  LEFT VENTRICLE PLAX 2D LVIDd:         4.33 cm      Diastology LVIDs:         3.29 cm      LV e' medial:    4.61 cm/s LV PW:         1.23 cm      LV E/e' medial:  10.7 LV IVS:        1.07 cm      LV e' lateral:   7.48 cm/s LVOT diam:     2.00 cm      LV E/e' lateral: 6.6 LV SV:         48 LV SV Index:   25 LVOT Area:     3.14 cm  LV Volumes (MOD) LV vol d, MOD A2C: 150.0 ml LV vol d, MOD A4C: 166.0 ml LV vol s, MOD A2C: 81.1 ml LV vol s, MOD A4C: 87.3 ml LV SV MOD A2C:     68.9 ml LV SV MOD A4C:     166.0 ml LV SV MOD BP:      75.0 ml RIGHT VENTRICLE RV Basal diam:  4.20 cm RV Mid diam:    3.02 cm RV S prime:     9.90 cm/s TAPSE (M-mode): 1.4 cm LEFT ATRIUM             Index        RIGHT ATRIUM           Index LA diam:        4.00 cm 2.05 cm/m   RA Area:     22.50 cm LA Vol (A2C):   91.6 ml 46.85 ml/m  RA Volume:   66.40 ml  33.96 ml/m LA Vol (A4C):   88.9 ml 45.46 ml/m LA Biplane Vol: 97.3 ml 49.76 ml/m  AORTIC VALVE LVOT Vmax:   61.30 cm/s LVOT Vmean:  39.800 cm/s LVOT VTI:    0.154 m  AORTA Ao Root diam: 3.10 cm Ao Asc diam:  3.30 cm MITRAL VALVE               TRICUSPID VALVE MV Area (PHT): 2.13 cm    TR Peak grad:   39.4 mmHg MV Decel Time: 356 msec    TR Vmax:        314.00 cm/s MV E velocity: 49.30  cm/s MV A velocity: 21.00 cm/s  SHUNTS MV E/A ratio:  2.35        Systemic VTI:  0.15 m                            Systemic Diam: 2.00 cm Epifanio Lesches MD Electronically signed by Epifanio Lesches MD Signature Date/Time: 12/31/2022/2:46:25 PM  Final    US Paracentesis  Result Date: 12/29/2022 INDICATION: Recurrent ascites EXAM: ULTRASOUND GUIDED  PARACENTESIS MEDICATIONS: None. COMPLICATIONS: None immediate. PROCEDURE: Informed written consent was obtained from the patient after a discussion of the risks, benefits and alternatives to treatment. A timeout was performed prior to the initiation of the procedure. Initial ultrasound scanning demonstrates a large amount of ascites within the left lower abdominal quadrant. The right lower abdomen was prepped and draped in the usual sterile fashion. 1% lidocaine was used for local anesthesia. Following this, a 19 gauge, 7-cm, Yueh catheter was introduced. An ultrasound image was saved for documentation purposes. The paracentesis was performed. The catheter was removed and a dressing was applied. The patient tolerated the procedure well without immediate post procedural complication. FINDINGS: A total of approximately 4.7 L of straw-colored fluid was removed. IMPRESSION: Successful ultrasound-guided paracentesis yielding liters of peritoneal fluid. Electronically Signed   By: Acquanetta Belling [name]   On: 12/29/2022 09:59   CT ANGIO ABD/PELVIS BRTO  Result Date: 12/26/2022 CLINICAL DATA:  Cirrhosis, portal hypertension. EXAM: CTA ABDOMEN AND PELVIS WITHOUT AND WITH CONTRAST TECHNIQUE: Multidetector CT imaging of the abdomen and pelvis was performed using the standard protocol during bolus administration of intravenous contrast. Multiplanar reconstructed images and MIPs were obtained and reviewed to evaluate the vascular anatomy. RADIATION DOSE REDUCTION: This exam was performed according to the departmental dose-optimization program which includes automated  exposure control, adjustment of the mA and/or kV according to patient size and/or use of iterative reconstruction technique. CONTRAST:  75mL ISOVUE-370 IOPAMIDOL (ISOVUE-370) INJECTION 76% COMPARISON:  None Available. FINDINGS: VASCULAR Aorta: No evidence of aneurysm or dissection. Advanced calcified atherosclerotic plaque throughout. Celiac: Not visualized SMA: Not visualized Renals: Heavily calcified atherosclerotic plaque likely results in critical stenosis of the right main renal artery. The left main renal artery is patent without significant stenosis or evidence of FMD. IMA: Patent without evidence of aneurysm, dissection, vasculitis or significant stenosis. Inflow: Bulky calcification at the aortic bifurcation extending into both iliac arteries results in moderate to high-grade stenosis on the right and mild stenosis on the left. There is then mild poststenotic dilation of the right common iliac artery. Stenosis is present at the origin of both hypogastric arteries. The external iliac arteries are relatively spared from disease. Proximal Outflow: Bulky calcified plaque in the common femoral arteries bilaterally. No occlusion. The visualized proximal superficial and profunda femoral arteries are normal. Veins: Patent hepatic and portal veins. No evidence of portal venous thrombosis. The splenic vein is patent. No significant esophageal or gastric varices. No gastro renal shunt. Review of the MIP images confirms the above findings. NON-VASCULAR Lower chest: Cardiomegaly. Small right and trace left pleural effusions. Mild dependent atelectasis. Hepatobiliary: Small cirrhotic liver. No arterially enhancing lesion to suggest hepatocellular carcinoma. Small gallstones present in the gallbladder lumen. No gallbladder distension or biliary ductal dilation. Pancreas: Unremarkable. No pancreatic ductal dilatation or surrounding inflammatory changes. Spleen: No splenic injury or perisplenic hematoma. Adrenals/Urinary  Tract: Renal size discrepancy. The right kidney is smaller than the left likely due to chronic ischemia. No hydronephrosis or nephrolithiasis. Circumscribed simple water attenuation cysts bilaterally. No imaging follow-up is recommended. Stomach/Bowel: Stomach is within normal limits. Appendix appears normal. No evidence of bowel wall thickening, distention, or inflammatory changes. Lymphatic: No suspicious lymphadenopathy. Reproductive: Prostate is unremarkable. Other: Large volume ascites. Mild mesenteric edema. Umbilical hernia containing ascitic fluid. Musculoskeletal: No acute fracture or aggressive appearing lytic or blastic osseous lesion. 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  IMPRESSION: VASCULAR 1. Patent hepatic and portal veins. No evidence of portal venous thrombosis. 2. No gastro renal shunt, gastroesophageal varices or other significant portosystemic collaterals. 3. 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 4.  Aortic Atherosclerosis (ICD10-I70.0). NON-VASCULAR 1. Hepatic cirrhosis with large volume ascites. 2. No evidence of hepatocellular carcinoma. 3. Cardiomegaly. 4. Umbilical hernia containing ascitic fluid. 5. Multilevel degenerative disc disease. Signed, Sterling Big, MD, RPVI [facility] Electronically Signed   By: Malachy Moan [name]   On: 12/26/2022 10:47   US Paracentesis  Result Date: 12/26/2022 INDICATION: Cirrhosis and ascites. EXAM: ULTRASOUND GUIDED PARACENTESIS MEDICATIONS: None. COMPLICATIONS: None immediate. PROCEDURE: Informed written consent was obtained from the patient after a discussion of the risks, benefits and alternatives to treatment. A timeout was performed prior to the initiation of the procedure. Initial ultrasound scanning demonstrates a large amount of  ascites within the right lower abdominal quadrant. The right lower abdomen was prepped and draped in the usual sterile fashion. 1% lidocaine was used for local anesthesia. Following this, a 6 Fr Safe-T-Centesis catheter was introduced. An ultrasound image was saved for documentation purposes. The paracentesis was performed. The catheter was removed and a dressing was applied. The patient tolerated the procedure well without immediate post procedural complication. FINDINGS: A total of approximately 4 L of yellow, turbid fluid was removed. Samples were sent to the laboratory as requested by the clinical team. IMPRESSION: Successful ultrasound-guided paracentesis yielding 4 liters of peritoneal fluid. Electronically Signed   By: Irish Lack [name]   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  SBP: Recent discharge on December 29, 2022 after admission for SBP.  Patient was asymptomatic at the time, detected on routine outpatient paracentesis.  He received IV Rocephin and discharged on Vantin for completion of 5 days of antibiotics.  He was transition to Cipro for SBP prophylaxis but due to concern for lengthening of QTc in the setting of amiodarone use, cardiology requested we switch him to Bactrim DS.  He started Bactrim DS daily on January 08, 2023.  He is scheduled for TIPS next week but had routine paracentesis of 5 L removed yesterday as outlined above.  Gram stain negative.  Cultures pending.  Currently on IV Rocephin 2 g IV every 24 hours.  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  Currently scheduled for TIPS 01/21/2023.  MELD score cannot be calculated without.  Patient has been on lactulose  due to constipation, no prior documented hepatic encephalopathy.  Bump in creatinine since recent admission likely multifactorial in the setting of recent paracentesis, SBP, and diuretics. Not sure that Bactrim is playing a role, he has been on for four days.  Reassuringly creatinine is improving today.   Plan:   Rocephin 2 g IV daily for 5 days. Albumin 25% 1.5 g/kg day 1 (max dose 100g).  He received 50 g yesterday after his paracentesis, will given 50g today.   Plan for 1 g/kg day 3 (max dose 100g).  Follow-up body fluid culture. 2 g sodium diet. Titrate lactulose to achieve 2-3 bowel movements daily. CMET, PT/INR tomorrow for MELD. Consider initiation of home diuretics.  Based on clinical  course, his TIPs procedure may need to be rescheduled.   LOS: 1 day   We would like to thank you for the opportunity to participate in the care of Moataz Kerwin Vater.  Leanna Battles. Dixon Boos [facility] [phone] 8/15/20249:27 AM

## 2023-01-16 DIAGNOSIS — R188 Other ascites: Secondary | ICD-10-CM | POA: Diagnosis not present

## 2023-01-16 DIAGNOSIS — K652 Spontaneous bacterial peritonitis: Secondary | ICD-10-CM | POA: Diagnosis not present

## 2023-01-16 DIAGNOSIS — K7460 Unspecified cirrhosis of liver: Secondary | ICD-10-CM | POA: Diagnosis not present

## 2023-01-16 LAB — CBC WITH DIFFERENTIAL/PLATELET
Abs Immature Granulocytes: 0.05 10*3/uL (ref 0.00–0.07)
Basophils Absolute: 0.1 10*3/uL (ref 0.0–0.1)
Basophils Relative: 1 %
Eosinophils Absolute: 0.2 10*3/uL (ref 0.0–0.5)
Eosinophils Relative: 2 %
HCT: 29.8 % — ABNORMAL LOW (ref 39.0–52.0)
Hemoglobin: 9.4 g/dL — ABNORMAL LOW (ref 13.0–17.0)
Immature Granulocytes: 0 %
Lymphocytes Relative: 6 %
Lymphs Abs: 0.7 10*3/uL (ref 0.7–4.0)
MCH: 28.2 pg (ref 26.0–34.0)
MCHC: 31.5 g/dL (ref 30.0–36.0)
MCV: 89.5 fL (ref 80.0–100.0)
Monocytes Absolute: 1 10*3/uL (ref 0.1–1.0)
Monocytes Relative: 9 %
Neutro Abs: 9.4 10*3/uL — ABNORMAL HIGH (ref 1.7–7.7)
Neutrophils Relative %: 82 %
Platelets: 313 10*3/uL (ref 150–400)
RBC: 3.33 MIL/uL — ABNORMAL LOW (ref 4.22–5.81)
RDW: 16.4 % — ABNORMAL HIGH (ref 11.5–15.5)
WBC: 11.4 10*3/uL — ABNORMAL HIGH (ref 4.0–10.5)
nRBC: 0 % (ref 0.0–0.2)

## 2023-01-16 LAB — COMPREHENSIVE METABOLIC PANEL
ALT: 26 U/L (ref 0–44)
AST: 17 U/L (ref 15–41)
Albumin: 3.2 g/dL — ABNORMAL LOW (ref 3.5–5.0)
Alkaline Phosphatase: 58 U/L (ref 38–126)
Anion gap: 8 (ref 5–15)
BUN: 35 mg/dL — ABNORMAL HIGH (ref 8–23)
CO2: 28 mmol/L (ref 22–32)
Calcium: 8.2 mg/dL — ABNORMAL LOW (ref 8.9–10.3)
Chloride: 99 mmol/L (ref 98–111)
Creatinine, Ser: 2.07 mg/dL — ABNORMAL HIGH (ref 0.61–1.24)
GFR, Estimated: 32 mL/min — ABNORMAL LOW (ref >60)
Glucose, Bld: 104 mg/dL — ABNORMAL HIGH (ref 70–99)
Potassium: 4.4 mmol/L (ref 3.5–5.1)
Sodium: 135 mmol/L (ref 135–145)
Total Bilirubin: 0.4 mg/dL (ref 0.3–1.2)
Total Protein: 5.5 g/dL — ABNORMAL LOW (ref 6.5–8.1)

## 2023-01-16 LAB — MAGNESIUM: Magnesium: 2.3 mg/dL (ref 1.7–2.4)

## 2023-01-16 LAB — PROTIME-INR
INR: 2.3 — ABNORMAL HIGH (ref 0.8–1.2)
Prothrombin Time: 25.7 s — ABNORMAL HIGH (ref 11.4–15.2)

## 2023-01-16 MED ORDER — LACTULOSE 10 GM/15ML PO SOLN
20.0000 g | Freq: Three times a day (TID) | ORAL | Status: DC
  Administered 2023-01-16 – 2023-01-18 (×7): 20 g via ORAL
  Filled 2023-01-16 (×7): qty 30

## 2023-01-16 NOTE — Progress Notes (Cosign Needed Addendum)
Gastroenterology Progress Note   Referring Provider: No ref. provider found Primary Care Physician:  Carylon Perches, MD Primary Gastroenterologist:  Dolores Frame, MD  Patient ID: Caleb Wolf; [id number]; Jun 17, 1940    Subjective   Patient denies any abdominal pain, distention, nausea, vomiting.  He has been having a bowel movement about every other day on average at home.  Wife states that since he has been admitted they have all been giving him lactulose as needed instead of 3 times daily like he has been receiving at home.  Objective   Vital signs in last 24 hours Temp:  [97.5 F (36.4 C)-98.9 F (37.2 C)] 98.4 F (36.9 C) (08/16 1418) Pulse Rate:  [44-54] 44 (08/16 1418) Resp:  [14-17] 14 (08/16 1418) BP: (122-133)/(44-73) 127/44 (08/16 1418) SpO2:  [95 %-100 %] 99 % (08/16 1418) Last BM Date : 01/15/23  Physical Exam General:   Alert and oriented, pleasant Head:  Normocephalic and atraumatic. Eyes:  No icterus, sclera clear. Conjuctiva pink.  Neck:  Supple, without thyromegaly or masses.   Abdomen:  Bowel sounds present, soft, non-tender, non-distended. No HSM or hernias noted. No rebound or guarding. No masses appreciated.  Small umbilical hernia noted Extremities: Nonpitting lower extremity edema. Neurologic:  Alert and  oriented x4;  grossly normal neurologically. Skin:  Warm and dry, intact without significant lesions.  Psych:  Alert and cooperative. Normal mood and affect.  Intake/Output from previous day: 08/15 0701 - 08/16 0700 In: 596.7 [P.O.:480; IV Piggyback:116.7] Out: -  Intake/Output this shift: Total I/O In: 240 [P.O.:240] Out: -   Lab Results  Recent Labs    01/14/23 1701 01/15/23 0513 01/16/23 0410  WBC 7.9 9.2 11.4*  HGB 9.8* 9.3* 9.4*  HCT 31.3* 29.6* 29.8*  PLT 285 287 313   BMET Recent Labs    01/14/23 1701 01/15/23 0513 01/16/23 0410  NA 134* 135 135  K 3.6 3.9 4.4  CL 98 101 99  CO2 27 26 28   GLUCOSE 112* 99 104*   BUN 39* 38* 35*  CREATININE 2.03* 1.97* 2.07*  CALCIUM 8.0* 8.0* 8.2*   LFT Recent Labs    01/14/23 1701 01/15/23 0513 01/16/23 0410  PROT 6.0* 5.4* 5.5*  ALBUMIN 3.3* 2.8* 3.2*  AST 25 19 17   ALT 33 30 26  ALKPHOS 63 61 58  BILITOT 0.4 0.3 0.4   PT/INR Recent Labs    01/16/23 0410  LABPROT 25.7*  INR 2.3*   Hepatitis Panel No results for input(s): "HEPBSAG", "HCVAB", "HEPAIGM", "HEPBIGM" in the last 72 hours.  Studies/Results US Paracentesis  Result Date: 01/14/2023 INDICATION: Recurrent ascites EXAM: ULTRASOUND GUIDED  PARACENTESIS MEDICATIONS: None. COMPLICATIONS: None immediate. PROCEDURE: Informed written consent was obtained from the patient after a discussion of the risks, benefits and alternatives to treatment. A timeout was performed prior to the initiation of the procedure. Initial ultrasound scanning demonstrates a large amount of ascites within the right lower abdominal quadrant. The right lower abdomen was prepped and draped in the usual sterile fashion. 1% lidocaine was used for local anesthesia. Following this, a 8 Fr Safe-T-Centesis catheter was introduced. An ultrasound image was saved for documentation purposes. The paracentesis was performed. The catheter was removed and a dressing was applied. The patient tolerated the procedure well without immediate post procedural complication. FINDINGS: A total of approximately 5 L of straw-colored fluid was removed. IMPRESSION: Successful ultrasound-guided paracentesis yielding 5 liters of peritoneal fluid. Electronically Signed   By: Harrington Challenger.D.  On: 01/14/2023 11:40   ECHOCARDIOGRAM COMPLETE  Result Date: 12/31/2022    ECHOCARDIOGRAM REPORT   Patient Name:   Caleb Wolf Date of Exam: 12/31/2022 Medical Rec #:  [id number]    Height:       69.0 in Accession #:    [id number]   Weight:       175.7 lb Date of Birth:  [birth year]     BSA:          1.955 m Patient Age:    82 years     BP:           110/50 mmHg Patient  Gender: M            HR:           58 bpm. Exam Location:  Outpatient Procedure: 2D Echo, 3D Echo, Color Doppler, Cardiac Doppler and Strain Analysis Indications:    Preoperative Evaluation  History:        Patient has prior history of Echocardiogram examinations, most                 recent 12/11/2021. Previous Myocardial Infarction and CAD, Prior                 CABG and Abnormal ECG, Arrythmias:Atrial Fibrillation; Risk                 Factors:Hypertension, Former Smoker and Dyslipidemia.  Sonographer:    Jeryl Columbia RDCS Referring Phys: [id number] JON MUGWERU IMPRESSIONS  1. Left ventricular ejection fraction, by estimation, is 45 to 50%. The left ventricle has mildly decreased function. The left ventricle demonstrates regional wall motion abnormalities (see scoring diagram/findings for description). Left ventricular diastolic parameters are indeterminate.  2. Right ventricular systolic function is normal. The right ventricular size is normal. There is mildly elevated pulmonary artery systolic pressure. The estimated right ventricular systolic pressure is 42.4 mmHg.  3. Left atrial size was severely dilated.  4. Right atrial size was mildly dilated.  5. The mitral valve is normal in structure. Trivial mitral valve regurgitation.  6. The aortic valve is tricuspid. Aortic valve regurgitation is not visualized. Aortic valve sclerosis/calcification is present, without any evidence of aortic stenosis.  7. The inferior vena cava is normal in size with greater than 50% respiratory variability, suggesting right atrial pressure of 3 mmHg. FINDINGS  Left Ventricle: Left ventricular ejection fraction, by estimation, is 45 to 50%. The left ventricle has mildly decreased function. The left ventricle demonstrates regional wall motion abnormalities. The left ventricular internal cavity size was normal in size. There is no left ventricular hypertrophy. Left ventricular diastolic parameters are indeterminate.  LV Wall Scoring:  The entire septum is hypokinetic. The entire anterior wall, entire lateral wall, entire inferior wall, and apex are normal. Right Ventricle: The right ventricular size is normal. No increase in right ventricular wall thickness. Right ventricular systolic function is normal. There is mildly elevated pulmonary artery systolic pressure. The tricuspid regurgitant velocity is 3.14  m/s, and with an assumed right atrial pressure of 3 mmHg, the estimated right ventricular systolic pressure is 42.4 mmHg. Left Atrium: Left atrial size was severely dilated. Right Atrium: Right atrial size was mildly dilated. Pericardium: There is no evidence of pericardial effusion. Mitral Valve: The mitral valve is normal in structure. Trivial mitral valve regurgitation. Tricuspid Valve: The tricuspid valve is normal in structure. Tricuspid valve regurgitation is trivial. Aortic Valve: The aortic valve is tricuspid. Aortic valve regurgitation is not visualized. Aortic valve sclerosis/calcification  is present, without any evidence of aortic stenosis. Pulmonic Valve: The pulmonic valve was not well visualized. Pulmonic valve regurgitation is mild. Aorta: The aortic root and ascending aorta are structurally normal, with no evidence of dilitation. Venous: The inferior vena cava is normal in size with greater than 50% respiratory variability, suggesting right atrial pressure of 3 mmHg. IAS/Shunts: The interatrial septum was not well visualized.  LEFT VENTRICLE PLAX 2D LVIDd:         4.33 cm      Diastology LVIDs:         3.29 cm      LV e' medial:    4.61 cm/s LV PW:         1.23 cm      LV E/e' medial:  10.7 LV IVS:        1.07 cm      LV e' lateral:   7.48 cm/s LVOT diam:     2.00 cm      LV E/e' lateral: 6.6 LV SV:         48 LV SV Index:   25 LVOT Area:     3.14 cm  LV Volumes (MOD) LV vol d, MOD A2C: 150.0 ml LV vol d, MOD A4C: 166.0 ml LV vol s, MOD A2C: 81.1 ml LV vol s, MOD A4C: 87.3 ml LV SV MOD A2C:     68.9 ml LV SV MOD A4C:     166.0  ml LV SV MOD BP:      75.0 ml RIGHT VENTRICLE RV Basal diam:  4.20 cm RV Mid diam:    3.02 cm RV S prime:     9.90 cm/s TAPSE (M-mode): 1.4 cm LEFT ATRIUM             Index        RIGHT ATRIUM           Index LA diam:        4.00 cm 2.05 cm/m   RA Area:     22.50 cm LA Vol (A2C):   91.6 ml 46.85 ml/m  RA Volume:   66.40 ml  33.96 ml/m LA Vol (A4C):   88.9 ml 45.46 ml/m LA Biplane Vol: 97.3 ml 49.76 ml/m  AORTIC VALVE LVOT Vmax:   61.30 cm/s LVOT Vmean:  39.800 cm/s LVOT VTI:    0.154 m  AORTA Ao Root diam: 3.10 cm Ao Asc diam:  3.30 cm MITRAL VALVE               TRICUSPID VALVE MV Area (PHT): 2.13 cm    TR Peak grad:   39.4 mmHg MV Decel Time: 356 msec    TR Vmax:        314.00 cm/s MV E velocity: 49.30 cm/s MV A velocity: 21.00 cm/s  SHUNTS MV E/A ratio:  2.35        Systemic VTI:  0.15 m                            Systemic Diam: 2.00 cm Epifanio Lesches MD Electronically signed by Epifanio Lesches MD Signature Date/Time: 12/31/2022/2:46:25 PM    Final    US Paracentesis  Result Date: 12/29/2022 INDICATION: Recurrent ascites EXAM: ULTRASOUND GUIDED  PARACENTESIS MEDICATIONS: None. COMPLICATIONS: None immediate. PROCEDURE: Informed written consent was obtained from the patient after a discussion of the risks, benefits and alternatives to treatment. A timeout was performed prior to the initiation of  the procedure. Initial ultrasound scanning demonstrates a large amount of ascites within the left lower abdominal quadrant. The right lower abdomen was prepped and draped in the usual sterile fashion. 1% lidocaine was used for local anesthesia. Following this, a 19 gauge, 7-cm, Yueh catheter was introduced. An ultrasound image was saved for documentation purposes. The paracentesis was performed. The catheter was removed and a dressing was applied. The patient tolerated the procedure well without immediate post procedural complication. FINDINGS: A total of approximately 4.7 L of straw-colored fluid was  removed. IMPRESSION: Successful ultrasound-guided paracentesis yielding liters of peritoneal fluid. Electronically Signed   By: Acquanetta Belling [name]   On: 12/29/2022 09:59   CT ANGIO ABD/PELVIS BRTO  Result Date: 12/26/2022 CLINICAL DATA:  Cirrhosis, portal hypertension. EXAM: CTA ABDOMEN AND PELVIS WITHOUT AND WITH CONTRAST TECHNIQUE: Multidetector CT imaging of the abdomen and pelvis was performed using the standard protocol during bolus administration of intravenous contrast. Multiplanar reconstructed images and MIPs were obtained and reviewed to evaluate the vascular anatomy. RADIATION DOSE REDUCTION: This exam was performed according to the departmental dose-optimization program which includes automated exposure control, adjustment of the mA and/or kV according to patient size and/or use of iterative reconstruction technique. CONTRAST:  75mL ISOVUE-370 IOPAMIDOL (ISOVUE-370) INJECTION 76% COMPARISON:  None Available. FINDINGS: VASCULAR Aorta: No evidence of aneurysm or dissection. Advanced calcified atherosclerotic plaque throughout. Celiac: Not visualized SMA: Not visualized Renals: Heavily calcified atherosclerotic plaque likely results in critical stenosis of the right main renal artery. The left main renal artery is patent without significant stenosis or evidence of FMD. IMA: Patent without evidence of aneurysm, dissection, vasculitis or significant stenosis. Inflow: Bulky calcification at the aortic bifurcation extending into both iliac arteries results in moderate to high-grade stenosis on the right and mild stenosis on the left. There is then mild poststenotic dilation of the right common iliac artery. Stenosis is present at the origin of both hypogastric arteries. The external iliac arteries are relatively spared from disease. Proximal Outflow: Bulky calcified plaque in the common femoral arteries bilaterally. No occlusion. The visualized proximal superficial and profunda femoral arteries are normal.  Veins: Patent hepatic and portal veins. No evidence of portal venous thrombosis. The splenic vein is patent. No significant esophageal or gastric varices. No gastro renal shunt. Review of the MIP images confirms the above findings. NON-VASCULAR Lower chest: Cardiomegaly. Small right and trace left pleural effusions. Mild dependent atelectasis. Hepatobiliary: Small cirrhotic liver. No arterially enhancing lesion to suggest hepatocellular carcinoma. Small gallstones present in the gallbladder lumen. No gallbladder distension or biliary ductal dilation. Pancreas: Unremarkable. No pancreatic ductal dilatation or surrounding inflammatory changes. Spleen: No splenic injury or perisplenic hematoma. Adrenals/Urinary Tract: Renal size discrepancy. The right kidney is smaller than the left likely due to chronic ischemia. No hydronephrosis or nephrolithiasis. Circumscribed simple water attenuation cysts bilaterally. No imaging follow-up is recommended. Stomach/Bowel: Stomach is within normal limits. Appendix appears normal. No evidence of bowel wall thickening, distention, or inflammatory changes. Lymphatic: No suspicious lymphadenopathy. Reproductive: Prostate is unremarkable. Other: Large volume ascites. Mild mesenteric edema. Umbilical hernia containing ascitic fluid. Musculoskeletal: No acute fracture or aggressive appearing lytic or blastic osseous lesion. 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 IMPRESSION: VASCULAR 1. Patent hepatic and portal veins. No evidence of portal venous thrombosis. 2. No gastro renal shunt, gastroesophageal varices or other significant portosystemic collaterals. 3. 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 4.  Aortic  Atherosclerosis (ICD10-I70.0). NON-VASCULAR 1. Hepatic cirrhosis with large volume ascites. 2. No evidence of hepatocellular carcinoma. 3.  Cardiomegaly. 4. Umbilical hernia containing ascitic fluid. 5. Multilevel degenerative disc disease. Signed, Sterling Big, MD, RPVI [facility] Electronically Signed   By: Malachy Moan [name]   On: 12/26/2022 10:47   US Paracentesis  Result Date: 12/26/2022 INDICATION: Cirrhosis and ascites. EXAM: ULTRASOUND GUIDED PARACENTESIS MEDICATIONS: None. COMPLICATIONS: None immediate. PROCEDURE: Informed written consent was obtained from the patient after a discussion of the risks, benefits and alternatives to treatment. A timeout was performed prior to the initiation of the procedure. Initial ultrasound scanning demonstrates a large amount of ascites within the right lower abdominal quadrant. The right lower abdomen was prepped and draped in the usual sterile fashion. 1% lidocaine was used for local anesthesia. Following this, a 6 Fr Safe-T-Centesis catheter was introduced. An ultrasound image was saved for documentation purposes. The paracentesis was performed. The catheter was removed and a dressing was applied. The patient tolerated the procedure well without immediate post procedural complication. FINDINGS: A total of approximately 4 L of yellow, turbid fluid was removed. Samples were sent to the laboratory as requested by the clinical team. IMPRESSION: Successful ultrasound-guided paracentesis yielding 4 liters of peritoneal fluid. Electronically Signed   By: Irish Lack [name]   On: 12/26/2022 10:20    Assessment  82 y.o. 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   Decompensated cirrhosis: Course has been complicated by refractory ascites and SBP.  MELD score has increased to 23 today, likely given bump in  creatinine.  Bump in creatinine likely secondary to poor p.o. intake in the prior 24 hours without receiving IV fluids given difficulty with IV access initially.  Fortunately scheduled for TIPS 01/21/2023 however in conversation with Dr. Milford Cage today given his MELD score is take that he would prefer recheck MELD labs in 3-4 weeks and postpone TIPS procedure for 1 month (his office will arrange rescheduling).  I have added back patient's home lactulose which is 30 mL 3 times daily.  He continues to not show any signs of HE.  SBP: Had recent admission in July for SBP but was also asymptomatic at that time.  He received IV Rocephin and was discharged on Vantin (Cefpodoxime).  He was initially transition to Cipro for prophylaxis but given concern for prolonged QTc given amiodarone use, cardiology requested we switch him to Bactrim which occurred on 8/8. 5L removed from paracentesis Wednesday with plans for repeat again on Monday 8/19.  Possible mild bump in creatinine related to Bactrim.  Although PMNs elevated, Gram stain has been negative and cultures have no growth to date at 24 hours.  Currently receiving IV Rocephin 2 g daily.  He received albumin per protocol on day 1 and did receive again today (75g).   Plan / Recommendations  CMP tomorrow Continue Rocephin 2 g IV daily for total 5 days. Continue to follow ascites culture 2g sodium diet Switch lactulose from PRN to 20g TID TIPS procedure will be postponed it was a month, IR requesting MELD labs in 3 to 4 weeks Plan to continue Bactrim for prophylaxis upon discharge    LOS: 2 days    01/16/2023, 2:47 PM   Brooke Bonito, MSN, FNP-BC,  AGACNP-BC [facility]

## 2023-01-16 NOTE — Plan of Care (Signed)
  Problem: Education: Goal: Knowledge of General Education information will improve Description Including pain rating scale, medication(s)/side effects and non-pharmacologic comfort measures Outcome: Progressing   Problem: Health Behavior/Discharge Planning: Goal: Ability to manage health-related needs will improve Outcome: Progressing   

## 2023-01-16 NOTE — TOC Initial Note (Signed)
Transition of Care [facility]) - Initial/Assessment Note    Patient Details  Name: Caleb Wolf MRN: [id number] Date of Birth: [birth year]  Transition of Care [facility]) CM/SW Contact:    Villa Herb, LCSWA Phone Number: 01/16/2023, 11:31 AM  Clinical Narrative:                 Pt is high risk for readmission. CSW met with pt at bedside to complete assessment. Pt states that he lives with his wife and is independent in completing his ADLs. Pt states that his wife is able to provide transportation when needed to appointments. Pt has had HH in the past after a knee replacement. Pt states that he has a cane and rollator to use when needed. TOC to follow.   Expected Discharge Plan: Home/Self Care Barriers to Discharge: Continued Medical Work up   Patient Goals and CMS Choice Patient states their goals for this hospitalization and ongoing recovery are:: return home CMS Medicare.gov Compare Post Acute Care list provided to:: Patient Choice offered to / list presented to : Patient [place] ownership interest in [facility].provided to:: Patient    Expected Discharge Plan and Services In-house Referral: Clinical Social Work Discharge Planning Services: CM Consult   Living arrangements for the past 2 months: Single Family Home                                      Prior Living Arrangements/Services Living arrangements for the past 2 months: Single Family Home Lives with:: Spouse Patient language and need for interpreter reviewed:: Yes Do you feel safe going back to the place where you live?: Yes      Need for Family Participation in Patient Care: Yes (Comment) Care giver support system in place?: Yes (comment) Current home services: DME Criminal Activity/Legal Involvement Pertinent to Current Situation/Hospitalization: No - Comment as needed  Activities of Daily Living Home Assistive Devices/Equipment: Cane (specify quad or straight) ADL Screening (condition at time of  admission) Patient's cognitive ability adequate to safely complete daily activities?: Yes Is the patient deaf or have difficulty hearing?: No Does the patient have difficulty seeing, even when wearing glasses/contacts?: No Does the patient have difficulty concentrating, remembering, or making decisions?: No Patient able to express need for assistance with ADLs?: Yes Does the patient have difficulty dressing or bathing?: Yes Independently performs ADLs?: No Communication: Needs assistance Is this a change from baseline?: Pre-admission baseline Dressing (OT): Needs assistance Is this a change from baseline?: Pre-admission baseline Grooming: Needs assistance Is this a change from baseline?: Pre-admission baseline Feeding: Independent Bathing: Needs assistance Is this a change from baseline?: Pre-admission baseline Toileting: Needs assistance Is this a change from baseline?: Pre-admission baseline In/Out Bed: Needs assistance Is this a change from baseline?: Pre-admission baseline Walks in Home: Needs assistance, Independent with device (comment) Is this a change from baseline?: Pre-admission baseline Does the patient have difficulty walking or climbing stairs?: Yes Weakness of Legs: Both Weakness of Arms/Hands: Both  Permission Sought/Granted                  Emotional Assessment Appearance:: Appears stated age Attitude/Demeanor/Rapport: Engaged Affect (typically observed): Accepting Orientation: : Oriented to Self, Oriented to Place, Oriented to  Time, Oriented to Situation Alcohol / Substance Use: Not Applicable Psych Involvement: No (comment)  Admission diagnosis:  Spontaneous bacterial peritonitis (HCC) [K65.2] SBP (spontaneous bacterial peritonitis) (HCC) [K65.2] Patient  Active Problem List   Diagnosis Date Noted   Spontaneous bacterial peritonitis (HCC) 01/14/2023   SBP (spontaneous bacterial peritonitis) (HCC) 12/26/2022   Preop respiratory exam 12/17/2022    Hepatic cirrhosis (HCC) 11/10/2022   Ascites 11/10/2022   Constipation 11/10/2022   Pleural effusion 09/22/2022   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   Ground glass opacity present on imaging of lung 12/31/2021   Hemoptysis 12/13/2021   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) 12/13/2021   CAD (coronary artery disease) 12/10/2021   Chronic respiratory failure with hypoxia (HCC) 12/10/2021   Posterior vitreous detachment of left eye 02/13/2020   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   Vitreous floaters, left 02/13/2020   History of vitrectomy 02/13/2020   Left epiretinal membrane 02/13/2020   HLD (hyperlipidemia) 12/07/2019   Low back pain 11/15/2019   Lumbar post-laminectomy syndrome 11/15/2019   Spinal stenosis of lumbar region 11/15/2019   Pain in joint of right hip 06/09/2019   Trigger finger 04/08/2019   Special screening for malignant neoplasms, colon 11/25/2017   Absolute anemia 11/25/2017   Persistent atrial fibrillation (HCC) 07/28/2017   GERD (gastroesophageal reflux disease) 04/28/2017   Chronic kidney disease, stage 3b (HCC) 04/28/2017   Achalasia 12/18/2015   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   Encounter for therapeutic drug monitoring 05/31/2014   Atrial fibrillation with rapid ventricular response (HCC) 05/22/2014   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   Abnormal EKG    NSTEMI (non-ST elevated myocardial infarction) (HCC)    Carotid artery stenosis without cerebral infarction    Benign non-nodular prostatic hyperplasia with lower urinary tract symptoms 03/27/2014   Chest pain 02/09/2014   HTN (hypertension) 02/09/2014   Carotid stenosis 08/02/2013   Peripheral edema 08/02/2013   Urge incontinence of urine 10/04/2012   OSA (obstructive sleep apnea) 09/21/2012   Recurrent nephrolithiasis 04/05/2012   Difficulty walking 01/29/2012   Balance disorder 01/29/2012   OA (osteoarthritis) of hip 12/08/2011   History of revision of total replacement of right hip joint 12/08/2011    ED (erectile dysfunction) of organic origin 04/07/2011   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   History of total knee replacement, left 09/10/2009   PCP:  Carylon Perches, MD Pharmacy:   [facility] [phone] - [place], [place] - [address] AT [facility] [address] [place] [address] Phone: [phone] Fax: [phone]     Social Determinants of Health (SDOH) Social History: SDOH Screenings   Food Insecurity: No Food Insecurity (01/14/2023)  Housing: Low Risk  (01/14/2023)  Transportation Needs: No Transportation Needs (01/14/2023)  Utilities: Not At Risk (01/14/2023)  Tobacco Use: Medium Risk (01/14/2023)   SDOH Interventions:     Readmission Risk Interventions    01/16/2023   11:30 AM 12/16/2021   11:51 AM  Readmission Risk Prevention Plan  Transportation Screening Complete Complete  PCP or Specialist Appt within 5-7 Days  Complete  Home Care Screening  Complete  Medication Review (RN CM)  Complete  HRI or Home Care Consult Complete   Social Work Consult for Recovery Care Planning/Counseling Complete   Palliative Care Screening Not Applicable   Medication Review Oceanographer) Complete

## 2023-01-16 NOTE — Care Management Important Message (Signed)
Important Message  Patient Details  Name: Caleb Wolf MRN: [id number] Date of Birth: December 27, 1940   Medicare Important Message Given:  Yes     Corey Harold 01/16/2023, 11:27 AM

## 2023-01-16 NOTE — Progress Notes (Signed)
Pt slept through the night, no c/o pain noted. Pt bradycardic throughout the shift. Pt given PRN Lactulose upon request.

## 2023-01-16 NOTE — Progress Notes (Signed)
Progress Note    Caleb Wolf   OZH:[id number]  DOB: May 28, 1941  DOA: 01/14/2023     2 PCP: Carylon Perches, MD  Initial CC: SBP  Hospital Course: [name] is an 82 yo male with PMH cirrhosis, CAD s/p CABG 2015, CKD3b, AAA, arthritis, HTN, gout, GERD, HLD, persistent A-fib s/p ablation 2019 & 2022 followed by DCCV 06/2021 due to recurrence again, sleep apnea.  He has been followed by GI and cardiology closely. He has started to undergo paracenteses that started on 11/12/2022. He underwent 7th paracentesis on 01/14/23. He is scheduled for a TIPS on 01/21/23 as well.  He was diagnosed with SBP after paracentesis on 12/26/22; treated with rocephin and discharged on vantin to complete course.  This was to be followed by Cipro for SBP prophylaxis thereafter. However, due to concurrent amiodarone use there was concern for QTc prolongation and he was changed to daily Bactrim DS which his wife states he'd had 4 days of prior to this admission.  He then underwent paracentesis once again on 01/14/2023.  Cell count was again consistent with SBP and he was admitted for antibiotics and further workup.  Interval History:  No events overnight.  Resting comfortably when seen this morning.  Wife present bedside.  Still remains asymptomatic, mild discomfort in abdomen but no worse.  Assessment and Plan: * Spontaneous bacterial peritonitis (HCC) - repeat paracentesis (7th since 6/12) performed on 8/14 - PMN count 360 consistent with SBP; patient seemingly asymptomatic both times now - restarted back on rocephin; was also started on bactrim outpatient for ppx - follow up culture results - suspect some overdiuresis contributing to mild renal fxn worsening, but will need routine monitoring of renal function in setting of long term bactrim use for ongoing ppx  Chronic kidney disease, stage 3b (HCC) - patient has history of CKD3b. Baseline creat ~ 1.8 - 1.9, eGFR~ 35 - creat 2.03 on admission; not consistent with  true acute worsening but does have potential in setting of aggressive diuretic regimen, routine paracentesis, and bactrim use - if able to get TIPS, then that would reduce paracentesis dependency, but would benefit from intermittent BMP monitoring  Hepatic cirrhosis (HCC) - continue lactulose and spironolactone - lasix on hold on admission but should be okay to resume at this time -Monitoring renal function as well -TIPS delayed and plan is for repeat MELD score in 3 to 4 weeks  Persistent atrial fibrillation (HCC) - s/p ablation 2019 & 2022 followed by DCCV 06/2021 -Continue amiodarone and xarelto  CAD (coronary artery disease) s/p CABG 2015 - continue lipitor and xarelto   HLD (hyperlipidemia) -Continue statin  HTN (hypertension) -Continue cardura, proscar -Continue aldactone   OSA (obstructive sleep apnea) - at bedtime CPAP   Old records reviewed in assessment of this patient  Antimicrobials: Rocephin 8/14 >> current   DVT prophylaxis:  SCDs Start: 01/14/23 2132 Rivaroxaban (XARELTO) tablet 15 mg   Code Status:   Code Status: DNR  Mobility Assessment (Last 72 Hours)     Mobility Assessment     Row Name 01/16/23 0900 01/15/23 2254 01/15/23 1430       Does patient have an order for bedrest or is patient medically unstable No - Continue assessment No - Continue assessment No - Continue assessment     What is the highest level of mobility based on the progressive mobility assessment? Level 5 (Walks with assist in room/hall) - Balance while stepping forward/back and can walk in room with assist -  Complete Level 5 (Walks with assist in room/hall) - Balance while stepping forward/back and can walk in room with assist - Complete Level 5 (Walks with assist in room/hall) - Balance while stepping forward/back and can walk in room with assist - Complete              Barriers to discharge: none Disposition Plan:  Home Status is: Inpt  Objective: Blood pressure (!)  127/44, pulse (!) 44, temperature 98.4 F (36.9 C), temperature source Oral, resp. rate 14, height 5\' 9"  (1.753 m), weight 75.5 kg, SpO2 99%.  Examination:  Physical Exam Constitutional:      General: He is not in acute distress.    Appearance: Normal appearance.  HENT:     Head: Normocephalic and atraumatic.     Mouth/Throat:     Mouth: Mucous membranes are moist.  Eyes:     Extraocular Movements: Extraocular movements intact.  Cardiovascular:     Rate and Rhythm: Normal rate and regular rhythm.  Pulmonary:     Effort: Pulmonary effort is normal. No respiratory distress.     Breath sounds: Normal breath sounds. No wheezing.  Abdominal:     General: Bowel sounds are normal. There is distension (Improved).     Palpations: Abdomen is soft.     Tenderness: There is no abdominal tenderness.  Musculoskeletal:        General: Normal range of motion.     Cervical back: Normal range of motion and neck supple.  Skin:    General: Skin is warm and dry.  Neurological:     General: No focal deficit present.     Mental Status: He is alert.  Psychiatric:        Mood and Affect: Mood normal.        Behavior: Behavior normal.      Consultants:  GI  Procedures:    Data Reviewed: Results for orders placed or performed during the hospital encounter of 01/14/23 (from the past 24 hour(s))  Protime-INR     Status: Abnormal   Collection Time: 01/16/23  4:10 AM  Result Value Ref Range   Prothrombin Time 25.7 (H) 11.4 - 15.2 seconds   INR 2.3 (H) 0.8 - 1.2  Comprehensive metabolic panel     Status: Abnormal   Collection Time: 01/16/23  4:10 AM  Result Value Ref Range   Sodium 135 135 - 145 mmol/L   Potassium 4.4 3.5 - 5.1 mmol/L   Chloride 99 98 - 111 mmol/L   CO2 28 22 - 32 mmol/L   Glucose, Bld 104 (H) 70 - 99 mg/dL   BUN 35 (H) 8 - 23 mg/dL   Creatinine, Ser 1.61 (H) 0.61 - 1.24 mg/dL   Calcium 8.2 (L) 8.9 - 10.3 mg/dL   Total Protein 5.5 (L) 6.5 - 8.1 g/dL   Albumin 3.2 (L) 3.5  - 5.0 g/dL   AST 17 15 - 41 U/L   ALT 26 0 - 44 U/L   Alkaline Phosphatase 58 38 - 126 U/L   Total Bilirubin 0.4 0.3 - 1.2 mg/dL   GFR, Estimated 32 (L) >60 mL/min   Anion gap 8 5 - 15  CBC with Differential/Platelet     Status: Abnormal   Collection Time: 01/16/23  4:10 AM  Result Value Ref Range   WBC 11.4 (H) 4.0 - 10.5 K/uL   RBC 3.33 (L) 4.22 - 5.81 MIL/uL   Hemoglobin 9.4 (L) 13.0 - 17.0 g/dL   HCT 09.6 (L)  39.0 - 52.0 %   MCV 89.5 80.0 - 100.0 fL   MCH 28.2 26.0 - 34.0 pg   MCHC 31.5 30.0 - 36.0 g/dL   RDW 57.8 (H) 46.9 - 62.9 %   Platelets 313 150 - 400 K/uL   nRBC 0.0 0.0 - 0.2 %   Neutrophils Relative % 82 %   Neutro Abs 9.4 (H) 1.7 - 7.7 K/uL   Lymphocytes Relative 6 %   Lymphs Abs 0.7 0.7 - 4.0 K/uL   Monocytes Relative 9 %   Monocytes Absolute 1.0 0.1 - 1.0 K/uL   Eosinophils Relative 2 %   Eosinophils Absolute 0.2 0.0 - 0.5 K/uL   Basophils Relative 1 %   Basophils Absolute 0.1 0.0 - 0.1 K/uL   Immature Granulocytes 0 %   Abs Immature Granulocytes 0.05 0.00 - 0.07 K/uL  Magnesium     Status: None   Collection Time: 01/16/23  4:10 AM  Result Value Ref Range   Magnesium 2.3 1.7 - 2.4 mg/dL    I have reviewed pertinent nursing notes, vitals, labs, and images as necessary. I have ordered labwork to follow up on as indicated.  I have reviewed the last notes from staff over past 24 hours. I have discussed patient's care plan and test results with nursing staff, CM/SW, and other staff as appropriate.  Time spent: Greater than 50% of the 55 minute visit was spent in counseling/coordination of care for the patient as laid out in the A&P.   LOS: 2 days   Lewie Chamber, MD [facility] 01/16/2023, 5:01 PM

## 2023-01-17 ENCOUNTER — Telehealth: Payer: Self-pay | Admitting: Internal Medicine

## 2023-01-17 ENCOUNTER — Inpatient Hospital Stay (HOSPITAL_COMMUNITY): Payer: Medicare HMO

## 2023-01-17 DIAGNOSIS — K652 Spontaneous bacterial peritonitis: Secondary | ICD-10-CM | POA: Diagnosis not present

## 2023-01-17 LAB — CBC WITH DIFFERENTIAL/PLATELET
Abs Immature Granulocytes: 0.05 10*3/uL (ref 0.00–0.07)
Basophils Absolute: 0.1 10*3/uL (ref 0.0–0.1)
Basophils Relative: 1 %
Eosinophils Absolute: 0.2 10*3/uL (ref 0.0–0.5)
Eosinophils Relative: 2 %
HCT: 28.6 % — ABNORMAL LOW (ref 39.0–52.0)
Hemoglobin: 8.8 g/dL — ABNORMAL LOW (ref 13.0–17.0)
Immature Granulocytes: 1 %
Lymphocytes Relative: 7 %
Lymphs Abs: 0.8 10*3/uL (ref 0.7–4.0)
MCH: 27.5 pg (ref 26.0–34.0)
MCHC: 30.8 g/dL (ref 30.0–36.0)
MCV: 89.4 fL (ref 80.0–100.0)
Monocytes Absolute: 1 10*3/uL (ref 0.1–1.0)
Monocytes Relative: 9 %
Neutro Abs: 9 10*3/uL — ABNORMAL HIGH (ref 1.7–7.7)
Neutrophils Relative %: 80 %
Platelets: 289 10*3/uL (ref 150–400)
RBC: 3.2 MIL/uL — ABNORMAL LOW (ref 4.22–5.81)
RDW: 16.4 % — ABNORMAL HIGH (ref 11.5–15.5)
WBC: 11 10*3/uL — ABNORMAL HIGH (ref 4.0–10.5)
nRBC: 0 % (ref 0.0–0.2)

## 2023-01-17 LAB — BASIC METABOLIC PANEL
Anion gap: 9 (ref 5–15)
BUN: 31 mg/dL — ABNORMAL HIGH (ref 8–23)
CO2: 24 mmol/L (ref 22–32)
Calcium: 8 mg/dL — ABNORMAL LOW (ref 8.9–10.3)
Chloride: 102 mmol/L (ref 98–111)
Creatinine, Ser: 1.75 mg/dL — ABNORMAL HIGH (ref 0.61–1.24)
GFR, Estimated: 39 mL/min — ABNORMAL LOW (ref >60)
Glucose, Bld: 94 mg/dL (ref 70–99)
Potassium: 3.6 mmol/L (ref 3.5–5.1)
Sodium: 135 mmol/L (ref 135–145)

## 2023-01-17 LAB — MAGNESIUM: Magnesium: 2.2 mg/dL (ref 1.7–2.4)

## 2023-01-17 MED ORDER — POTASSIUM CHLORIDE CRYS ER 20 MEQ PO TBCR
40.0000 meq | EXTENDED_RELEASE_TABLET | Freq: Once | ORAL | Status: AC
  Administered 2023-01-17: 40 meq via ORAL
  Filled 2023-01-17: qty 2

## 2023-01-17 NOTE — Telephone Encounter (Signed)
Can we see if we can move this patient's follow-up visit in our clinic with [place] to 3 to 4 weeks?  Thank you, diagnosis hospital follow-up, cirrhosis

## 2023-01-17 NOTE — Progress Notes (Signed)
Progress Note    Caleb Wolf   ONG:[id number]  DOB: 02-Sep-1940  DOA: 01/14/2023     3 PCP: Caleb Perches, MD  Initial CC: SBP  Hospital Course: [name] is an 82 yo male with PMH cirrhosis, CAD s/p CABG 2015, CKD3b, AAA, arthritis, HTN, gout, GERD, HLD, persistent A-fib s/p ablation 2019 & 2022 followed by DCCV 06/2021 due to recurrence again, sleep apnea.  He has been followed by GI and cardiology closely. He has started to undergo paracenteses that started on 11/12/2022. He underwent 7th paracentesis on 01/14/23. He is scheduled for a TIPS on 01/21/23 as well.  He was diagnosed with SBP after paracentesis on 12/26/22; treated with rocephin and discharged on vantin to complete course.  This was to be followed by Cipro for SBP prophylaxis thereafter. However, due to concurrent amiodarone use there was concern for QTc prolongation and he was changed to daily Bactrim DS which his wife states he'd had 4 days of prior to this admission.  He then underwent paracentesis once again on 01/14/2023.  Cell count was again consistent with SBP and he was admitted for antibiotics and further workup.  Interval History:  No events overnight.  Continues to remain asymptomatic.  Understands plan is for completing course inpatient and discharging tomorrow.  Assessment and Plan: * Spontaneous bacterial peritonitis (HCC) - repeat paracentesis (7th since 6/12) performed on 8/14 - PMN count 360 consistent with SBP; patient seemingly asymptomatic both times now - restarted back on rocephin; was also started on bactrim outpatient for ppx - follow up culture results; remain negative - GI recommending finishing 5-day course inpatient; completion date, 01/18/2023 -Bactrim to be continued on discharge for prophylaxis  Chronic kidney disease, stage 3b (HCC) - patient has history of CKD3b. Baseline creat ~ 1.8 - 1.9, eGFR~ 35 - creat 2.03 on admission; not consistent with true acute worsening but does have potential in  setting of aggressive diuretic regimen, routine paracentesis, and bactrim use - if able to get TIPS, then that would reduce paracentesis dependency, but would benefit from intermittent BMP monitoring  Hepatic cirrhosis (HCC) - continue lactulose and spironolactone; Lasix has been resumed as well -Monitoring renal function as well -TIPS delayed and plan is for repeat MELD score in 3 to 4 weeks  Persistent atrial fibrillation (HCC) - s/p ablation 2019 & 2022 followed by DCCV 06/2021 -Continue amiodarone and xarelto  CAD (coronary artery disease) s/p CABG 2015 - continue lipitor and xarelto   HLD (hyperlipidemia) -Continue statin  HTN (hypertension) -Continue cardura, proscar -Continue aldactone   OSA (obstructive sleep apnea) - at bedtime CPAP   Old records reviewed in assessment of this patient  Antimicrobials: Rocephin 8/14 >> current   DVT prophylaxis:  SCDs Start: 01/14/23 2132 Rivaroxaban (XARELTO) tablet 15 mg   Code Status:   Code Status: DNR  Mobility Assessment (Last 72 Hours)     Mobility Assessment     Row Name 01/17/23 0900 01/16/23 2208 01/16/23 0900 01/15/23 2254 01/15/23 1430   Does patient have an order for bedrest or is patient medically unstable No - Continue assessment No - Continue assessment No - Continue assessment No - Continue assessment No - Continue assessment   What is the highest level of mobility based on the progressive mobility assessment? Level 5 (Walks with assist in room/hall) - Balance while stepping forward/back and can walk in room with assist - Complete Level 5 (Walks with assist in room/hall) - Balance while stepping forward/back and can walk in  room with assist - Complete Level 5 (Walks with assist in room/hall) - Balance while stepping forward/back and can walk in room with assist - Complete Level 5 (Walks with assist in room/hall) - Balance while stepping forward/back and can walk in room with assist - Complete Level 5 (Walks with  assist in room/hall) - Balance while stepping forward/back and can walk in room with assist - Complete            Barriers to discharge: none Disposition Plan:  Home Status is: Inpt  Objective: Blood pressure (!) 105/94, pulse (!) 49, temperature 99.1 F (37.3 C), resp. rate 16, height 5\' 9"  (1.753 m), weight 75.5 kg, SpO2 98%.  Examination:  Physical Exam Constitutional:      General: He is not in acute distress.    Appearance: Normal appearance.  HENT:     Head: Normocephalic and atraumatic.     Mouth/Throat:     Mouth: Mucous membranes are moist.  Eyes:     Extraocular Movements: Extraocular movements intact.  Cardiovascular:     Rate and Rhythm: Normal rate and regular rhythm.  Pulmonary:     Effort: Pulmonary effort is normal. No respiratory distress.     Breath sounds: Normal breath sounds. No wheezing.  Abdominal:     General: Bowel sounds are normal. There is distension (Improved).     Palpations: Abdomen is soft.     Tenderness: There is no abdominal tenderness.  Musculoskeletal:        General: Normal range of motion.     Cervical back: Normal range of motion and neck supple.  Skin:    General: Skin is warm and dry.  Neurological:     General: No focal deficit present.     Mental Status: He is alert.  Psychiatric:        Mood and Affect: Mood normal.        Behavior: Behavior normal.      Consultants:  GI  Procedures:    Data Reviewed: Results for orders placed or performed during the hospital encounter of 01/14/23 (from the past 24 hour(s))  Basic metabolic panel     Status: Abnormal   Collection Time: 01/17/23  4:52 AM  Result Value Ref Range   Sodium 135 135 - 145 mmol/L   Potassium 3.6 3.5 - 5.1 mmol/L   Chloride 102 98 - 111 mmol/L   CO2 24 22 - 32 mmol/L   Glucose, Bld 94 70 - 99 mg/dL   BUN 31 (H) 8 - 23 mg/dL   Creatinine, Ser 1.61 (H) 0.61 - 1.24 mg/dL   Calcium 8.0 (L) 8.9 - 10.3 mg/dL   GFR, Estimated 39 (L) >60 mL/min   Anion  gap 9 5 - 15  CBC with Differential/Platelet     Status: Abnormal   Collection Time: 01/17/23  4:52 AM  Result Value Ref Range   WBC 11.0 (H) 4.0 - 10.5 K/uL   RBC 3.20 (L) 4.22 - 5.81 MIL/uL   Hemoglobin 8.8 (L) 13.0 - 17.0 g/dL   HCT 09.6 (L) 04.5 - 40.9 %   MCV 89.4 80.0 - 100.0 fL   MCH 27.5 26.0 - 34.0 pg   MCHC 30.8 30.0 - 36.0 g/dL   RDW 81.1 (H) 91.4 - 78.2 %   Platelets 289 150 - 400 K/uL   nRBC 0.0 0.0 - 0.2 %   Neutrophils Relative % 80 %   Neutro Abs 9.0 (H) 1.7 - 7.7 K/uL   Lymphocytes  Relative 7 %   Lymphs Abs 0.8 0.7 - 4.0 K/uL   Monocytes Relative 9 %   Monocytes Absolute 1.0 0.1 - 1.0 K/uL   Eosinophils Relative 2 %   Eosinophils Absolute 0.2 0.0 - 0.5 K/uL   Basophils Relative 1 %   Basophils Absolute 0.1 0.0 - 0.1 K/uL   Immature Granulocytes 1 %   Abs Immature Granulocytes 0.05 0.00 - 0.07 K/uL  Magnesium     Status: None   Collection Time: 01/17/23  4:52 AM  Result Value Ref Range   Magnesium 2.2 1.7 - 2.4 mg/dL    I have reviewed pertinent nursing notes, vitals, labs, and images as necessary. I have ordered labwork to follow up on as indicated.  I have reviewed the last notes from staff over past 24 hours. I have discussed patient's care plan and test results with nursing staff, CM/SW, and other staff as appropriate.    LOS: 3 days   Lewie Chamber, MD [facility] 01/17/2023, 12:03 PM

## 2023-01-17 NOTE — Plan of Care (Signed)

## 2023-01-18 DIAGNOSIS — K652 Spontaneous bacterial peritonitis: Secondary | ICD-10-CM | POA: Diagnosis not present

## 2023-01-18 DIAGNOSIS — N1832 Chronic kidney disease, stage 3b: Secondary | ICD-10-CM | POA: Diagnosis not present

## 2023-01-18 DIAGNOSIS — R188 Other ascites: Secondary | ICD-10-CM | POA: Diagnosis not present

## 2023-01-18 DIAGNOSIS — K7460 Unspecified cirrhosis of liver: Secondary | ICD-10-CM | POA: Diagnosis not present

## 2023-01-18 NOTE — Progress Notes (Signed)
Nsg Discharge Note  Admit Date:  01/14/2023 Discharge date: 01/18/2023   Ellin Mayhew Jelinek to be D/C'd Home per MD order.  AVS completed.  Patient/caregiver able to verbalize understanding.  Discharge Medication: Allergies as of 01/18/2023       Reactions   Codeine Sulfate Nausea Only        Medication List     TAKE these medications    acetaminophen 500 MG tablet Commonly known as: TYLENOL Take 1,000 mg by mouth every 6 (six) hours as needed for moderate pain.   amiodarone 200 MG tablet Commonly known as: PACERONE Take 1 tablet (200 mg total) by mouth daily.   atorvastatin 80 MG tablet Commonly known as: LIPITOR TAKE 1 TABLET BY MOUTH EVERY DAY IN THE EVENING   cyanocobalamin 1000 MCG tablet Commonly known as: VITAMIN B12 Take 1,000 mcg by mouth See admin instructions. 2-3 TIMES WEEKLY PER PT'S WIFE   doxazosin 4 MG tablet Commonly known as: CARDURA Take 4 mg by mouth in the morning.   empagliflozin 10 MG Tabs tablet Commonly known as: JARDIANCE Take 1 tablet (10 mg total) by mouth daily before breakfast.   feeding supplement Liqd Take 1 Container by mouth at bedtime. One shake before bedtime.   finasteride 5 MG tablet Commonly known as: PROSCAR Take 5 mg by mouth every evening.   fluticasone 50 MCG/ACT nasal spray Commonly known as: FLONASE Place 1 spray into both nostrils daily as needed for allergies or rhinitis.   furosemide 80 MG tablet Commonly known as: LASIX Take 1 tablet (80 mg total) by mouth 2 (two) times daily.   lactulose 10 GM/15ML solution Commonly known as: CHRONULAC Take 30 mLs (20 g total) by mouth 2 (two) times daily. Can increase to twice a day depending on constipation improvement What changed:  when to take this additional instructions   nitroGLYCERIN 0.4 MG SL tablet Commonly known as: NITROSTAT Place 1 tablet (0.4 mg total) under the tongue every 5 (five) minutes x 3 doses as needed for chest pain.   pantoprazole 40 MG  tablet Commonly known as: PROTONIX Take 1 tablet (40 mg total) by mouth daily before breakfast.   Rivaroxaban 15 MG Tabs tablet Commonly known as: XARELTO Take 1 tablet (15 mg total) by mouth daily with supper.   spironolactone 25 MG tablet Commonly known as: Aldactone Take 1 tablet (25 mg total) by mouth daily.   sulfamethoxazole-trimethoprim 800-160 MG tablet Commonly known as: BACTRIM DS Take 1 tablet by mouth daily.   Vitamin D 50 MCG (2000 UT) Caps Take 2,000 Units by mouth daily.        Discharge Assessment: Vitals:   01/17/23 2154 01/18/23 0514  BP:  (!) 119/50  Pulse: (!) 50   Resp: 18 17  Temp:  98.2 F (36.8 C)  SpO2: 97% 98%   Skin clean, dry and intact without evidence of skin break down, no evidence of skin tears noted. IV catheter discontinued intact. Site without signs and symptoms of complications - no redness or edema noted at insertion site, patient denies c/o pain - only slight tenderness at site.  Dressing with slight pressure applied.  D/c Instructions-Education: Discharge instructions given to patient/family with verbalized understanding. D/c education completed with patient/family including follow up instructions, medication list, d/c activities limitations if indicated, with other d/c instructions as indicated by MD - patient able to verbalize understanding, all questions fully answered. Patient instructed to return to ED, call 911, or call MD for any changes in  condition.  Patient escorted via WC, and D/C home via private auto.  Laurena Spies, RN 01/18/2023 10:50 AM

## 2023-01-18 NOTE — Plan of Care (Signed)
  Problem: Pain Managment: Goal: General experience of comfort will improve Outcome: Progressing   Problem: Clinical Measurements: Goal: Will remain free from infection Outcome: Progressing

## 2023-01-18 NOTE — Discharge Summary (Signed)
Physician Discharge Summary   Caleb Wolf:[id number] DOB: Sep 23, 1940 DOA: 01/14/2023  PCP: Carylon Perches, MD  Admit date: 01/14/2023 Discharge date: 01/18/2023   Admitted From: Home Disposition:  Home Discharging physician: Lewie Chamber, MD Barriers to discharge: none  Recommendations at discharge: Repeat paracentesis scheduled for 8/19 Follow up with GI and IR for TIPS rescheduling    Discharge Condition: stable CODE STATUS: DNR Diet recommendation:  Diet Orders (From admission, onward)     Start     Ordered   01/18/23 0000  Diet - low sodium heart healthy        01/18/23 1034   01/15/23 1008  Diet 2 gram sodium Room service appropriate? Yes; Fluid consistency: Thin  Diet effective now       Question Answer Comment  Room service appropriate? Yes   Fluid consistency: Thin      01/15/23 1007            Hospital Course: [name] is an 82 yo male with PMH cirrhosis, CAD s/p CABG 2015, CKD3b, AAA, arthritis, HTN, gout, GERD, HLD, persistent A-fib s/p ablation 2019 & 2022 followed by DCCV 06/2021 due to recurrence again, sleep apnea.  He has been followed by GI and cardiology closely. He has started to undergo paracenteses that started on 11/12/2022. He underwent 7th paracentesis on 01/14/23. He is scheduled for a TIPS on 01/21/23 as well.  He was diagnosed with SBP after paracentesis on 12/26/22; treated with rocephin and discharged on vantin to complete course.  This was to be followed by Cipro for SBP prophylaxis thereafter. However, due to concurrent amiodarone use there was concern for QTc prolongation and he was changed to daily Bactrim DS which his wife states he'd had 4 days of prior to this admission.  He then underwent paracentesis once again on 01/14/2023.  Cell count was again consistent with SBP and he was admitted for antibiotics and further workup.  He completed 5-day course of Rocephin for SBP.  Culture remained negative. He was followed by GI during  hospitalization at well.  He has plans for repeat paracentesis on 01/19/2023.  Outpatient TIPS procedure has been postponed due to SBP and hospitalization.  Assessment and Plan: * Spontaneous bacterial peritonitis (HCC) - repeat paracentesis (7th since 6/12) performed on 8/14 - PMN count 360 consistent with SBP; patient seemingly asymptomatic both times now - restarted back on rocephin; was also started on bactrim outpatient for ppx - follow up culture results; remain negative - GI recommending finishing 5-day course inpatient; completion date, 01/18/2023 -Bactrim to be continued on discharge for prophylaxis  Chronic kidney disease, stage 3b (HCC) - patient has history of CKD3b. Baseline creat ~ 1.8 - 1.9, eGFR~ 35 - creat 2.03 on admission; not consistent with true acute worsening but does have potential in setting of aggressive diuretic regimen, routine paracentesis, and bactrim use - if able to get TIPS, then that would reduce paracentesis dependency, but would benefit from intermittent BMP monitoring  Hepatic cirrhosis (HCC) - continue lactulose and spironolactone; Lasix has been resumed as well -Monitoring renal function as well -TIPS delayed and plan is for repeat MELD score in 3 to 4 weeks  Persistent atrial fibrillation (HCC) - s/p ablation 2019 & 2022 followed by DCCV 06/2021 -Continue amiodarone and xarelto  CAD (coronary artery disease) s/p CABG 2015 - continue lipitor and xarelto   HLD (hyperlipidemia) -Continue statin  HTN (hypertension) -Continue cardura, proscar -Continue aldactone   OSA (obstructive sleep apnea) - at bedtime CPAP  The patient's chronic medical conditions were treated accordingly per the patient's home medication regimen except as noted.  On day of discharge, patient was felt deemed stable for discharge. Patient/family member advised to call PCP or come back to ER if needed.   Principal Diagnosis: Spontaneous bacterial peritonitis  [facility])  Discharge Diagnoses: Active Hospital Problems   Diagnosis Date Noted   Spontaneous bacterial peritonitis (HCC) 01/14/2023    Priority: 1.   Chronic kidney disease, stage 3b (HCC) 04/28/2017    Priority: 2.   Hepatic cirrhosis (HCC) 11/10/2022    Priority: 3.   Persistent atrial fibrillation (HCC) 07/28/2017    Priority: 4.   CAD (coronary artery disease) 12/10/2021   HLD (hyperlipidemia) 12/07/2019   HTN (hypertension) 02/09/2014   OSA (obstructive sleep apnea) 09/21/2012    Resolved Hospital Problems  No resolved problems to display.     Discharge Instructions     Diet - low sodium heart healthy   Complete by: As directed    Increase activity slowly   Complete by: As directed       Allergies as of 01/18/2023       Reactions   Codeine Sulfate Nausea Only        Medication List     TAKE these medications    acetaminophen 500 MG tablet Commonly known as: TYLENOL Take 1,000 mg by mouth every 6 (six) hours as needed for moderate pain.   amiodarone 200 MG tablet Commonly known as: PACERONE Take 1 tablet (200 mg total) by mouth daily.   atorvastatin 80 MG tablet Commonly known as: LIPITOR TAKE 1 TABLET BY MOUTH EVERY DAY IN THE EVENING   cyanocobalamin 1000 MCG tablet Commonly known as: VITAMIN B12 Take 1,000 mcg by mouth See admin instructions. 2-3 TIMES WEEKLY PER PT'S WIFE   doxazosin 4 MG tablet Commonly known as: CARDURA Take 4 mg by mouth in the morning.   empagliflozin 10 MG Tabs tablet Commonly known as: JARDIANCE Take 1 tablet (10 mg total) by mouth daily before breakfast.   feeding supplement Liqd Take 1 Container by mouth at bedtime. One shake before bedtime.   finasteride 5 MG tablet Commonly known as: PROSCAR Take 5 mg by mouth every evening.   fluticasone 50 MCG/ACT nasal spray Commonly known as: FLONASE Place 1 spray into both nostrils daily as needed for allergies or rhinitis.   furosemide 80 MG tablet Commonly known as:  LASIX Take 1 tablet (80 mg total) by mouth 2 (two) times daily.   lactulose 10 GM/15ML solution Commonly known as: CHRONULAC Take 30 mLs (20 g total) by mouth 2 (two) times daily. Can increase to twice a day depending on constipation improvement What changed:  when to take this additional instructions   nitroGLYCERIN 0.4 MG SL tablet Commonly known as: NITROSTAT Place 1 tablet (0.4 mg total) under the tongue every 5 (five) minutes x 3 doses as needed for chest pain.   pantoprazole 40 MG tablet Commonly known as: PROTONIX Take 1 tablet (40 mg total) by mouth daily before breakfast.   Rivaroxaban 15 MG Tabs tablet Commonly known as: XARELTO Take 1 tablet (15 mg total) by mouth daily with supper.   spironolactone 25 MG tablet Commonly known as: Aldactone Take 1 tablet (25 mg total) by mouth daily.   sulfamethoxazole-trimethoprim 800-160 MG tablet Commonly known as: BACTRIM DS Take 1 tablet by mouth daily.   Vitamin D 50 MCG (2000 UT) Caps Take 2,000 Units by mouth daily.  Allergies  Allergen Reactions   Codeine Sulfate Nausea Only    Consultations: GI  Procedures: 8/14: Paracentesis   Discharge Exam: BP (!) 119/50 (BP Location: Right Arm)   Pulse (!) 50   Temp 98.2 F (36.8 C) (Oral)   Resp 17   Ht 5\' 9"  (1.753 m)   Wt 75.5 kg   SpO2 98%   BMI 24.58 kg/m  Physical Exam Constitutional:      General: He is not in acute distress.    Appearance: Normal appearance.  HENT:     Head: Normocephalic and atraumatic.     Mouth/Throat:     Mouth: Mucous membranes are moist.  Eyes:     Extraocular Movements: Extraocular movements intact.  Cardiovascular:     Rate and Rhythm: Normal rate and regular rhythm.  Pulmonary:     Effort: Pulmonary effort is normal. No respiratory distress.     Breath sounds: Normal breath sounds. No wheezing.  Abdominal:     General: Bowel sounds are normal. There is distension (Improved).     Palpations: Abdomen is soft.      Tenderness: There is no abdominal tenderness.  Musculoskeletal:        General: Normal range of motion.     Cervical back: Normal range of motion and neck supple.  Skin:    General: Skin is warm and dry.  Neurological:     General: No focal deficit present.     Mental Status: He is alert.  Psychiatric:        Mood and Affect: Mood normal.        Behavior: Behavior normal.      The results of significant diagnostics from this hospitalization (including imaging, microbiology, ancillary and laboratory) are listed below for reference.   Microbiology: Recent Results (from the past 240 hour(s))  Gram stain     Status: None   Collection Time: 01/14/23 11:00 AM   Specimen: Peritoneal Washings  Result Value Ref Range Status   Specimen Description PERITONEAL ASCITIC  Final   Special Requests BOTTLES DRAWN AEROBIC AND ANAEROBIC 10CC  Final   Gram Stain   Final    NO ORGANISMS SEEN WBC PRESENT,BOTH PMN AND MONONUCLEAR CYTOSPIN SMEAR Performed at [facility], [address] [place], [place] [address]    Report Status 01/14/2023 FINAL  Final  Culture, body fluid w Gram Stain-bottle     Status: None (Preliminary result)   Collection Time: 01/14/23 11:00 AM   Specimen: Peritoneal Washings  Result Value Ref Range Status   Specimen Description PERITONEAL ASCITIC  Final   Special Requests BOTTLES DRAWN AEROBIC AND ANAEROBIC 10CC  Final   Culture   Final    NO GROWTH 4 DAYS Performed at [facility], [address], [place], [place] [address]    Report Status PENDING  Incomplete     Labs: BNP (last 3 results) Recent Labs    03/06/22 0904 03/21/22 1253  BNP 510.5* 847.0*   Basic Metabolic Panel: Recent Labs  Lab 01/14/23 1701 01/15/23 0513 01/16/23 0410 01/17/23 0452  NA 134* 135 135 135  K 3.6 3.9 4.4 3.6  CL 98 101 99 102  CO2 27 26 28 24   GLUCOSE 112* 99 104* 94  BUN 39* 38* 35* 31*  CREATININE 2.03* 1.97* 2.07* 1.75*  CALCIUM 8.0* 8.0* 8.2* 8.0*  MG  --  2.3 2.3 2.2    Liver Function Tests: Recent Labs  Lab 01/14/23 1701 01/15/23 0513 01/16/23 0410  AST 25  19 17  ALT 33 30 26  ALKPHOS 63 61 58  BILITOT 0.4 0.3 0.4  PROT 6.0* 5.4* 5.5*  ALBUMIN 3.3* 2.8* 3.2*   No results for input(s): "LIPASE", "AMYLASE" in the last 168 hours. Recent Labs  Lab 01/15/23 0513  AMMONIA 15   CBC: Recent Labs  Lab 01/14/23 1701 01/15/23 0513 01/16/23 0410 01/17/23 0452  WBC 7.9 9.2 11.4* 11.0*  NEUTROABS  --  7.6 9.4* 9.0*  HGB 9.8* 9.3* 9.4* 8.8*  HCT 31.3* 29.6* 29.8* 28.6*  MCV 90.5 88.9 89.5 89.4  PLT 285 287 313 289   Cardiac Enzymes: No results for input(s): "CKTOTAL", "CKMB", "CKMBINDEX", "TROPONINI" in the last 168 hours. BNP: Invalid input(s): "POCBNP" CBG: No results for input(s): "GLUCAP" in the last 168 hours. D-Dimer No results for input(s): "DDIMER" in the last 72 hours. Hgb A1c No results for input(s): "HGBA1C" in the last 72 hours. Lipid Profile No results for input(s): "CHOL", "HDL", "LDLCALC", "TRIG", "CHOLHDL", "LDLDIRECT" in the last 72 hours. Thyroid function studies No results for input(s): "TSH", "T4TOTAL", "T3FREE", "THYROIDAB" in the last 72 hours.  Invalid input(s): "FREET3" Anemia work up No results for input(s): "VITAMINB12", "FOLATE", "FERRITIN", "TIBC", "IRON", "RETICCTPCT" in the last 72 hours. Urinalysis    Component Value Date/Time   COLORURINE YELLOW 01/14/2023 1833   APPEARANCEUR CLEAR 01/14/2023 1833   LABSPEC 1.009 01/14/2023 1833   PHURINE 5.0 01/14/2023 1833   GLUCOSEU >=500 (A) 01/14/2023 1833   HGBUR NEGATIVE 01/14/2023 1833   BILIRUBINUR NEGATIVE 01/14/2023 1833   KETONESUR NEGATIVE 01/14/2023 1833   PROTEINUR NEGATIVE 01/14/2023 1833   UROBILINOGEN 0.2 02/16/2014 1333   NITRITE NEGATIVE 01/14/2023 1833   LEUKOCYTESUR NEGATIVE 01/14/2023 1833   Sepsis Labs Recent Labs  Lab 01/14/23 1701 01/15/23 0513 01/16/23 0410 01/17/23 0452  WBC 7.9 9.2 11.4* 11.0*   Microbiology Recent Results  (from the past 240 hour(s))  Gram stain     Status: None   Collection Time: 01/14/23 11:00 AM   Specimen: Peritoneal Washings  Result Value Ref Range Status   Specimen Description PERITONEAL ASCITIC  Final   Special Requests BOTTLES DRAWN AEROBIC AND ANAEROBIC 10CC  Final   Gram Stain   Final    NO ORGANISMS SEEN WBC PRESENT,BOTH PMN AND MONONUCLEAR CYTOSPIN SMEAR Performed at [facility], [address], [place], [place] [address]    Report Status 01/14/2023 FINAL  Final  Culture, body fluid w Gram Stain-bottle     Status: None (Preliminary result)   Collection Time: 01/14/23 11:00 AM   Specimen: Peritoneal Washings  Result Value Ref Range Status   Specimen Description PERITONEAL ASCITIC  Final   Special Requests BOTTLES DRAWN AEROBIC AND ANAEROBIC 10CC  Final   Culture   Final    NO GROWTH 4 DAYS Performed at [facility], [address] [place], [place] [address]    Report Status PENDING  Incomplete    Procedures/Studies: DG Abd 1 View  Result Date: 01/17/2023 CLINICAL DATA:  Constipation EXAM: ABDOMEN - 1 VIEW COMPARISON:  CTA abdomen and pelvis 12/24/2022 FINDINGS: Multiple gas-filled loops of small large bowel without evidence of obstruction. Large colonic stool load in the left colon. No radio-opaque calculi or other significant radiographic abnormality are seen. Right THA. Degenerative arthritis left hip. IMPRESSION: Large stool load in the left colon.  No evidence of obstruction. Electronically Signed   By: Minerva Fester [name]   On: 01/17/2023 23:37   US Paracentesis  Result Date: 01/14/2023 INDICATION: Recurrent ascites EXAM:  ULTRASOUND GUIDED  PARACENTESIS MEDICATIONS: None. COMPLICATIONS: None immediate. PROCEDURE: Informed written consent was obtained from the patient after a discussion of the risks, benefits and alternatives to treatment. A timeout was performed prior to the initiation of the procedure. Initial ultrasound scanning demonstrates a large amount of  ascites within the right lower abdominal quadrant. The right lower abdomen was prepped and draped in the usual sterile fashion. 1% lidocaine was used for local anesthesia. Following this, a 8 Fr Safe-T-Centesis catheter was introduced. An ultrasound image was saved for documentation purposes. The paracentesis was performed. The catheter was removed and a dressing was applied. The patient tolerated the procedure well without immediate post procedural complication. FINDINGS: A total of approximately 5 L of straw-colored fluid was removed. IMPRESSION: Successful ultrasound-guided paracentesis yielding 5 liters of peritoneal fluid. Electronically Signed   By: Acquanetta Belling [name]   On: 01/14/2023 11:40   ECHOCARDIOGRAM COMPLETE  Result Date: 12/31/2022    ECHOCARDIOGRAM REPORT   Patient Name:   RONDO KOEL Date of Exam: 12/31/2022 Medical Rec #:  [id number]    Height:       69.0 in Accession #:    [id number]   Weight:       175.7 lb Date of Birth:  [birth year]     BSA:          1.955 m Patient Age:    82 years     BP:           110/50 mmHg Patient Gender: M            HR:           58 bpm. Exam Location:  Outpatient Procedure: 2D Echo, 3D Echo, Color Doppler, Cardiac Doppler and Strain Analysis Indications:    Preoperative Evaluation  History:        Patient has prior history of Echocardiogram examinations, most                 recent 12/11/2021. Previous Myocardial Infarction and CAD, Prior                 CABG and Abnormal ECG, Arrythmias:Atrial Fibrillation; Risk                 Factors:Hypertension, Former Smoker and Dyslipidemia.  Sonographer:    Jeryl Columbia RDCS Referring Phys: [id number] JON MUGWERU IMPRESSIONS  1. Left ventricular ejection fraction, by estimation, is 45 to 50%. The left ventricle has mildly decreased function. The left ventricle demonstrates regional wall motion abnormalities (see scoring diagram/findings for description). Left ventricular diastolic parameters are indeterminate.  2. Right  ventricular systolic function is normal. The right ventricular size is normal. There is mildly elevated pulmonary artery systolic pressure. The estimated right ventricular systolic pressure is 42.4 mmHg.  3. Left atrial size was severely dilated.  4. Right atrial size was mildly dilated.  5. The mitral valve is normal in structure. Trivial mitral valve regurgitation.  6. The aortic valve is tricuspid. Aortic valve regurgitation is not visualized. Aortic valve sclerosis/calcification is present, without any evidence of aortic stenosis.  7. The inferior vena cava is normal in size with greater than 50% respiratory variability, suggesting right atrial pressure of 3 mmHg. FINDINGS  Left Ventricle: Left ventricular ejection fraction, by estimation, is 45 to 50%. The left ventricle has mildly decreased function. The left ventricle demonstrates regional wall motion abnormalities. The left ventricular internal cavity size was normal in size. There is no left ventricular hypertrophy. Left ventricular diastolic  parameters are indeterminate.  LV Wall Scoring: The entire septum is hypokinetic. The entire anterior wall, entire lateral wall, entire inferior wall, and apex are normal. Right Ventricle: The right ventricular size is normal. No increase in right ventricular wall thickness. Right ventricular systolic function is normal. There is mildly elevated pulmonary artery systolic pressure. The tricuspid regurgitant velocity is 3.14  m/s, and with an assumed right atrial pressure of 3 mmHg, the estimated right ventricular systolic pressure is 42.4 mmHg. Left Atrium: Left atrial size was severely dilated. Right Atrium: Right atrial size was mildly dilated. Pericardium: There is no evidence of pericardial effusion. Mitral Valve: The mitral valve is normal in structure. Trivial mitral valve regurgitation. Tricuspid Valve: The tricuspid valve is normal in structure. Tricuspid valve regurgitation is trivial. Aortic Valve: The aortic  valve is tricuspid. Aortic valve regurgitation is not visualized. Aortic valve sclerosis/calcification is present, without any evidence of aortic stenosis. Pulmonic Valve: The pulmonic valve was not well visualized. Pulmonic valve regurgitation is mild. Aorta: The aortic root and ascending aorta are structurally normal, with no evidence of dilitation. Venous: The inferior vena cava is normal in size with greater than 50% respiratory variability, suggesting right atrial pressure of 3 mmHg. IAS/Shunts: The interatrial septum was not well visualized.  LEFT VENTRICLE PLAX 2D LVIDd:         4.33 cm      Diastology LVIDs:         3.29 cm      LV e' medial:    4.61 cm/s LV PW:         1.23 cm      LV E/e' medial:  10.7 LV IVS:        1.07 cm      LV e' lateral:   7.48 cm/s LVOT diam:     2.00 cm      LV E/e' lateral: 6.6 LV SV:         48 LV SV Index:   25 LVOT Area:     3.14 cm  LV Volumes (MOD) LV vol d, MOD A2C: 150.0 ml LV vol d, MOD A4C: 166.0 ml LV vol s, MOD A2C: 81.1 ml LV vol s, MOD A4C: 87.3 ml LV SV MOD A2C:     68.9 ml LV SV MOD A4C:     166.0 ml LV SV MOD BP:      75.0 ml RIGHT VENTRICLE RV Basal diam:  4.20 cm RV Mid diam:    3.02 cm RV S prime:     9.90 cm/s TAPSE (M-mode): 1.4 cm LEFT ATRIUM             Index        RIGHT ATRIUM           Index LA diam:        4.00 cm 2.05 cm/m   RA Area:     22.50 cm LA Vol (A2C):   91.6 ml 46.85 ml/m  RA Volume:   66.40 ml  33.96 ml/m LA Vol (A4C):   88.9 ml 45.46 ml/m LA Biplane Vol: 97.3 ml 49.76 ml/m  AORTIC VALVE LVOT Vmax:   61.30 cm/s LVOT Vmean:  39.800 cm/s LVOT VTI:    0.154 m  AORTA Ao Root diam: 3.10 cm Ao Asc diam:  3.30 cm MITRAL VALVE               TRICUSPID VALVE MV Area (PHT): 2.13 cm    TR Peak grad:  39.4 mmHg MV Decel Time: 356 msec    TR Vmax:        314.00 cm/s MV E velocity: 49.30 cm/s MV A velocity: 21.00 cm/s  SHUNTS MV E/A ratio:  2.35        Systemic VTI:  0.15 m                            Systemic Diam: 2.00 cm Epifanio Lesches MD  Electronically signed by Epifanio Lesches MD Signature Date/Time: 12/31/2022/2:46:25 PM    Final    US Paracentesis  Result Date: 12/29/2022 INDICATION: Recurrent ascites EXAM: ULTRASOUND GUIDED  PARACENTESIS MEDICATIONS: None. COMPLICATIONS: None immediate. PROCEDURE: Informed written consent was obtained from the patient after a discussion of the risks, benefits and alternatives to treatment. A timeout was performed prior to the initiation of the procedure. Initial ultrasound scanning demonstrates a large amount of ascites within the left lower abdominal quadrant. The right lower abdomen was prepped and draped in the usual sterile fashion. 1% lidocaine was used for local anesthesia. Following this, a 19 gauge, 7-cm, Yueh catheter was introduced. An ultrasound image was saved for documentation purposes. The paracentesis was performed. The catheter was removed and a dressing was applied. The patient tolerated the procedure well without immediate post procedural complication. FINDINGS: A total of approximately 4.7 L of straw-colored fluid was removed. IMPRESSION: Successful ultrasound-guided paracentesis yielding liters of peritoneal fluid. Electronically Signed   By: Acquanetta Belling [name]   On: 12/29/2022 09:59   CT ANGIO ABD/PELVIS BRTO  Result Date: 12/26/2022 CLINICAL DATA:  Cirrhosis, portal hypertension. EXAM: CTA ABDOMEN AND PELVIS WITHOUT AND WITH CONTRAST TECHNIQUE: Multidetector CT imaging of the abdomen and pelvis was performed using the standard protocol during bolus administration of intravenous contrast. Multiplanar reconstructed images and MIPs were obtained and reviewed to evaluate the vascular anatomy. RADIATION DOSE REDUCTION: This exam was performed according to the departmental dose-optimization program which includes automated exposure control, adjustment of the mA and/or kV according to patient size and/or use of iterative reconstruction technique. CONTRAST:  75mL ISOVUE-370 IOPAMIDOL  (ISOVUE-370) INJECTION 76% COMPARISON:  None Available. FINDINGS: VASCULAR Aorta: No evidence of aneurysm or dissection. Advanced calcified atherosclerotic plaque throughout. Celiac: Not visualized SMA: Not visualized Renals: Heavily calcified atherosclerotic plaque likely results in critical stenosis of the right main renal artery. The left main renal artery is patent without significant stenosis or evidence of FMD. IMA: Patent without evidence of aneurysm, dissection, vasculitis or significant stenosis. Inflow: Bulky calcification at the aortic bifurcation extending into both iliac arteries results in moderate to high-grade stenosis on the right and mild stenosis on the left. There is then mild poststenotic dilation of the right common iliac artery. Stenosis is present at the origin of both hypogastric arteries. The external iliac arteries are relatively spared from disease. Proximal Outflow: Bulky calcified plaque in the common femoral arteries bilaterally. No occlusion. The visualized proximal superficial and profunda femoral arteries are normal. Veins: Patent hepatic and portal veins. No evidence of portal venous thrombosis. The splenic vein is patent. No significant esophageal or gastric varices. No gastro renal shunt. Review of the MIP images confirms the above findings. NON-VASCULAR Lower chest: Cardiomegaly. Small right and trace left pleural effusions. Mild dependent atelectasis. Hepatobiliary: Small cirrhotic liver. No arterially enhancing lesion to suggest hepatocellular carcinoma. Small gallstones present in the gallbladder lumen. No gallbladder distension or biliary ductal dilation. Pancreas: Unremarkable. No pancreatic ductal dilatation or surrounding inflammatory changes. Spleen:  No splenic injury or perisplenic hematoma. Adrenals/Urinary Tract: Renal size discrepancy. The right kidney is smaller than the left likely due to chronic ischemia. No hydronephrosis or nephrolithiasis. Circumscribed simple  water attenuation cysts bilaterally. No imaging follow-up is recommended. Stomach/Bowel: Stomach is within normal limits. Appendix appears normal. No evidence of bowel wall thickening, distention, or inflammatory changes. Lymphatic: No suspicious lymphadenopathy. Reproductive: Prostate is unremarkable. Other: Large volume ascites. Mild mesenteric edema. Umbilical hernia containing ascitic fluid. Musculoskeletal: No acute fracture or aggressive appearing lytic or blastic osseous lesion. 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 IMPRESSION: VASCULAR 1. Patent hepatic and portal veins. No evidence of portal venous thrombosis. 2. No gastro renal shunt, gastroesophageal varices or other significant portosystemic collaterals. 3. 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 4.  Aortic Atherosclerosis (ICD10-I70.0). NON-VASCULAR 1. Hepatic cirrhosis with large volume ascites. 2. No evidence of hepatocellular carcinoma. 3. Cardiomegaly. 4. Umbilical hernia containing ascitic fluid. 5. Multilevel degenerative disc disease. Signed, Sterling Big, MD, RPVI [facility] Electronically Signed   By: Malachy Moan [name]   On: 12/26/2022 10:47   US Paracentesis  Result Date: 12/26/2022 INDICATION: Cirrhosis and ascites. EXAM: ULTRASOUND GUIDED PARACENTESIS MEDICATIONS: None. COMPLICATIONS: None immediate. PROCEDURE: Informed written consent was obtained from the patient after a discussion of the risks, benefits and alternatives to treatment. A timeout was performed prior to the initiation of the procedure. Initial ultrasound scanning demonstrates a large amount of ascites within the right lower abdominal quadrant. The right lower abdomen was prepped and draped in the usual sterile fashion. 1% lidocaine was used for local  anesthesia. Following this, a 6 Fr Safe-T-Centesis catheter was introduced. An ultrasound image was saved for documentation purposes. The paracentesis was performed. The catheter was removed and a dressing was applied. The patient tolerated the procedure well without immediate post procedural complication. FINDINGS: A total of approximately 4 L of yellow, turbid fluid was removed. Samples were sent to the laboratory as requested by the clinical team. IMPRESSION: Successful ultrasound-guided paracentesis yielding 4 liters of peritoneal fluid. Electronically Signed   By: Irish Lack [name]   On: 12/26/2022 10:20     Time coordinating discharge: Over 30 minutes    Lewie Chamber, MD  [facility] 01/18/2023, 1:23 PM

## 2023-01-19 ENCOUNTER — Ambulatory Visit (HOSPITAL_COMMUNITY)
Admission: RE | Admit: 2023-01-19 | Discharge: 2023-01-19 | Disposition: A | Discharge status: Home / Self Care | Payer: Medicare HMO | Source: Ambulatory Visit | Attending: Gastroenterology | Admitting: Gastroenterology

## 2023-01-19 ENCOUNTER — Encounter (HOSPITAL_COMMUNITY): Payer: Self-pay

## 2023-01-19 ENCOUNTER — Other Ambulatory Visit (INDEPENDENT_AMBULATORY_CARE_PROVIDER_SITE_OTHER): Payer: Self-pay | Admitting: *Deleted

## 2023-01-19 ENCOUNTER — Other Ambulatory Visit (INDEPENDENT_AMBULATORY_CARE_PROVIDER_SITE_OTHER): Payer: Self-pay | Admitting: Gastroenterology

## 2023-01-19 ENCOUNTER — Telehealth (INDEPENDENT_AMBULATORY_CARE_PROVIDER_SITE_OTHER): Payer: Self-pay | Admitting: *Deleted

## 2023-01-19 VITALS — BP 153/54 | HR 53 | Temp 98.1°F | Resp 18

## 2023-01-19 DIAGNOSIS — K652 Spontaneous bacterial peritonitis: Secondary | ICD-10-CM

## 2023-01-19 DIAGNOSIS — K7460 Unspecified cirrhosis of liver: Secondary | ICD-10-CM

## 2023-01-19 DIAGNOSIS — R188 Other ascites: Secondary | ICD-10-CM | POA: Insufficient documentation

## 2023-01-19 LAB — GRAM STAIN: Gram Stain: NONE SEEN

## 2023-01-19 LAB — BODY FLUID CELL COUNT WITH DIFFERENTIAL
Eos, Fluid: 0 %
Lymphs, Fluid: 44 %
Monocyte-Macrophage-Serous Fluid: 6 % — ABNORMAL LOW (ref 50–90)
Neutrophil Count, Fluid: 50 % — ABNORMAL HIGH (ref 0–25)
Total Nucleated Cell Count, Fluid: 2001 cu mm — ABNORMAL HIGH (ref 0–1000)

## 2023-01-19 LAB — CULTURE, BODY FLUID W GRAM STAIN -BOTTLE: Culture: NO GROWTH

## 2023-01-19 MED ORDER — LIDOCAINE HCL (PF) 2 % IJ SOLN
INTRAMUSCULAR | Status: AC
  Filled 2023-01-19: qty 10

## 2023-01-19 MED ORDER — ALBUMIN HUMAN 25 % IV SOLN
50.0000 g | Freq: Once | INTRAVENOUS | Status: AC
  Administered 2023-01-19: 50 g via INTRAVENOUS

## 2023-01-19 MED ORDER — LIDOCAINE HCL (PF) 2 % IJ SOLN
10.0000 mL | Freq: Once | INTRAMUSCULAR | Status: AC
  Administered 2023-01-19: 10 mL

## 2023-01-19 MED ORDER — ALBUMIN HUMAN 25 % IV SOLN
INTRAVENOUS | Status: AC
  Filled 2023-01-19: qty 200

## 2023-01-19 NOTE — Telephone Encounter (Signed)
Patient had paracentesis today and asked for chelsea to give him a call [phone]

## 2023-01-19 NOTE — Telephone Encounter (Signed)
Discussed with pt per chelsea - Case discussed with Dr. Levon Hedger as fluid still shows infection. Can we see if he is able to have a CMP today for [place] to check liver enzymes, for now, he DOES not need to go to the ER unless he begins to have fevers, chills, becomes weak or has abdominal pain. We will be in touch once we have these results on further recommendations.   Pt verbalized understanding and states he will go to lab today.

## 2023-01-19 NOTE — Telephone Encounter (Signed)
Right now she doesn't;t have anything, I will have to wait for a cancellation. She is off a week and several days within the next month

## 2023-01-19 NOTE — Telephone Encounter (Signed)
Patient can be seen by any of the other APPs (does not have to be [place]) as well

## 2023-01-19 NOTE — Progress Notes (Signed)
Patient tolerated left sided paracentesis procedure and 50G of IV albumin well today and 3.8 Liters of cloudy dark yellow ascites removed with labs collected and sent for processing. Patient verbalized understanding of discharge instructions and left via wheelchair with wife with no acute distress noted.

## 2023-01-20 ENCOUNTER — Other Ambulatory Visit (INDEPENDENT_AMBULATORY_CARE_PROVIDER_SITE_OTHER): Payer: Self-pay | Admitting: *Deleted

## 2023-01-20 ENCOUNTER — Telehealth (INDEPENDENT_AMBULATORY_CARE_PROVIDER_SITE_OTHER): Payer: Self-pay | Admitting: *Deleted

## 2023-01-20 DIAGNOSIS — K7460 Unspecified cirrhosis of liver: Secondary | ICD-10-CM

## 2023-01-20 LAB — COMPREHENSIVE METABOLIC PANEL
ALT: 28 IU/L (ref 0–44)
AST: 22 IU/L (ref 0–40)
Albumin: 4.1 g/dL (ref 3.8–4.8)
Alkaline Phosphatase: 87 IU/L (ref 44–121)
BUN/Creatinine Ratio: 18 (ref 10–24)
BUN: 35 mg/dL — ABNORMAL HIGH (ref 8–27)
Bilirubin Total: 0.2 mg/dL (ref 0.0–1.2)
CO2: 26 mmol/L (ref 20–29)
Calcium: 8.7 mg/dL (ref 8.6–10.2)
Chloride: 99 mmol/L (ref 96–106)
Creatinine, Ser: 1.92 mg/dL — ABNORMAL HIGH (ref 0.76–1.27)
Globulin, Total: 1.8 g/dL (ref 1.5–4.5)
Glucose: 97 mg/dL (ref 70–99)
Potassium: 4.7 mmol/L (ref 3.5–5.2)
Sodium: 139 mmol/L (ref 134–144)
Total Protein: 5.9 g/dL — ABNORMAL LOW (ref 6.0–8.5)
eGFR: 35 mL/min/{1.73_m2} — ABNORMAL LOW (ref >59)

## 2023-01-20 LAB — ALBUMIN, PLEURAL OR PERITONEAL FLUID: Albumin, Fluid: 2.5 g/dL

## 2023-01-20 NOTE — Telephone Encounter (Signed)
Per Dr. Levon Hedger through a staff message - Can we check with the lab if they can check the albumin in the ascitic fluid sent yesterday? it would be an add on   I called lab and they are able to add on. Order put in epic.

## 2023-01-20 NOTE — Telephone Encounter (Signed)
CMP in epic for review

## 2023-01-20 NOTE — Telephone Encounter (Signed)
Await results

## 2023-01-21 ENCOUNTER — Encounter (HOSPITAL_COMMUNITY): Payer: Self-pay

## 2023-01-21 ENCOUNTER — Encounter (HOSPITAL_COMMUNITY): Admission: RE | Payer: Self-pay | Source: Home / Self Care

## 2023-01-21 ENCOUNTER — Ambulatory Visit: Payer: Medicare HMO | Admitting: Infectious Disease

## 2023-01-21 ENCOUNTER — Inpatient Hospital Stay (HOSPITAL_COMMUNITY): Payer: Medicare HMO

## 2023-01-21 ENCOUNTER — Other Ambulatory Visit: Payer: Self-pay

## 2023-01-21 ENCOUNTER — Encounter: Payer: Self-pay | Admitting: Infectious Disease

## 2023-01-21 ENCOUNTER — Inpatient Hospital Stay (HOSPITAL_COMMUNITY): Admission: RE | Admit: 2023-01-21 | Payer: Medicare HMO | Source: Home / Self Care | Admitting: Interventional Radiology

## 2023-01-21 VITALS — BP 131/66 | HR 53 | Temp 97.3°F | Ht 69.0 in | Wt 165.0 lb

## 2023-01-21 DIAGNOSIS — Z114 Encounter for screening for human immunodeficiency virus [HIV]: Secondary | ICD-10-CM | POA: Diagnosis not present

## 2023-01-21 DIAGNOSIS — D72829 Elevated white blood cell count, unspecified: Secondary | ICD-10-CM | POA: Insufficient documentation

## 2023-01-21 DIAGNOSIS — R188 Other ascites: Secondary | ICD-10-CM | POA: Diagnosis not present

## 2023-01-21 DIAGNOSIS — Z951 Presence of aortocoronary bypass graft: Secondary | ICD-10-CM | POA: Diagnosis not present

## 2023-01-21 DIAGNOSIS — K7460 Unspecified cirrhosis of liver: Secondary | ICD-10-CM

## 2023-01-21 HISTORY — DX: Elevated white blood cell count, unspecified: D72.829

## 2023-01-21 SURGERY — IR WITH ANESTHESIA
Anesthesia: General

## 2023-01-21 NOTE — Progress Notes (Signed)
Subjective:   Reason for infectious disease consult: Ascites with consistently elevated white blood cell count but no bacterial pathogens identified  Requesting Physician: Katrinka Blazing Mayorga   Patient ID: Caleb Wolf, male    DOB: [birth year], 81 y.o.   MRN: [id number]  HPI  -year-old man with multiple medical problems including history of prior cardiac arrest status post resuscitation with coronary artery disease having diet been diagnosed status post coronary bypass grafting in 2015, chronic kidney disease AAA hypertension gout GERD hyperlipidemia atrial fibrillation status post multiple cardioversions and ablations on chronic amiodarone who developed cirrhosis with ascites that were first sampled in June 2024.  At that first paracentesis the patient had 530 white 6 white blood cells with 56% lymphocytes and 25% neutrophils.  Cultures were negative.  Since then he has had additional  6paracenteses for ascites.  He has been on prophylactic antibiotics to prevent SBP.  Also had various rounds of oral antibiotics and it is my understanding 2 hospitalizations for IV antibiotics   His cell counts were relatively within that same range on paracentesis on the 17th and 20 June though they did rise to 632 and 748 respectively.  By November 25, 2018  paracentesis there were 1601 white blood cells with 86% lymphocytes and 9% neutrophils.  Cultures were negative on July 26 he had a another paracentesis this time with a white count of 1025 with 29% neutrophils and 35% lymphocytes.  Note no organisms ever grew in any of these cultures   After that paracentesis the patient was directed to be seen in the ER where he went it was admitted and ultimately treated with intravenous antibiotics in the hospital.  Note the patient himself was not especially sick at all.  He did not have fevers chills nausea vomiting or malaise or other symptoms.  He was really truly directed for admission based on the labs from  his paracentesis.  Placed on IV ceftriaxone and that hospitalization and then placed on ciprofloxacin before concerns about QT prolongation prompted switch to Bactrim.  He again grew no organism on peritoneal fluid.  He is subsidy had additional paracentesis on January 14, 2023 with white blood cell count of 1202 with 53% lymphocytes and 30% neutrophils.  Cultures are again without growth.  He has been referred to [place] infectious ease to help workup the reason for his persistently elevated white blood cell count and his ascitic fluid.  In talking to the patient he has largely lived in [place] at 1 time when he was working in he did travel throughout the country but largely the [place] in the [address].  He has been to [place] once but otherwise not been outside the [place].  He has never been exposed to anyone with tuberculosis.  He has not traveled to the [employer].  He has had some weight loss but in the context of Farxiga and also repeated paracenteses.      Past Medical History:  Diagnosis Date   AAA (abdominal aortic aneurysm) (HCC)    Arthritis    Basal cell carcinoma    Carotid stenosis    Chronic kidney disease, stage 3b (HCC) 12/13/2021   Coronary artery disease    Multvessel s/p CABG 2015   Enlarged prostate    Essential hypertension    GERD (gastroesophageal reflux disease)    Gout    History of colon polyps    History of kidney stones    Hyperlipidemia    Lumbar  disc disease    Persistent atrial fibrillation (HCC)    Sleep apnea    CPAP    Past Surgical History:  Procedure Laterality Date   ABLATION OF DYSRHYTHMIC FOCUS  07/28/2017   ATRIAL FIBRILLATION ABLATION N/A 07/28/2017   Procedure: ATRIAL FIBRILLATION ABLATION;  Surgeon: Hillis Range, MD;  [facility]: [facility] INV[facility]IVE [facility] LAB;  Service: Cardiovascular;  Laterality: N/A;   ATRIAL FIBRILLATION ABLATION N/A 12/04/2020   Procedure: ATRIAL FIBRILLATION ABLATION;  Surgeon: Hillis Range, MD;  Location: [facility] INVASIVE CV LAB;  Service: Cardiovascular;  Laterality: N/A;   BACK SURGERY     CARDIAC CATHETERIZATION  05/22/2014   Procedure: IABP INSERTION;  Surgeon: Marykay Lex, MD;  Location: [facility] CATH LAB;  Service: Cardiovascular;;   CARDIOVERSION N/A 04/29/2017   Procedure: CARDIOVERSION;  Surgeon: Jonelle Sidle, MD;  [facility]O [facility];  Service: Cardiovascular;  Laterality: N/A;   CARDIOVERSION N/A 08/13/2017   Procedure: CARDIOVERSION;  Surgeon: Chrystie Nose, MD;  Location: [facility] ENDOSCOPY;  Service: Cardiovascular;  Laterality: N/A;   CARDIOVERSION N/A 08/15/2019   Procedure: CARDIOVERSION;  Surgeon: Lars Masson, MD;  Location: [facility] ENDOSCOPY;  Service: Cardiovascular;  Laterality: N/A;   CARDIOVERSION N/A 03/09/2020   Procedure: CARDIOVERSION;  Surgeon: Jodelle Red, MD;  Location: [facility] ENDOSCOPY;  Service: Cardiovascular;  Laterality: N/A;   CARDIOVERSION N/A 07/06/2020   Procedure: CARDIOVERSION;  Surgeon: Thurmon Fair, MD;  Location: [facility] ENDOSCOPY;  Service: Cardiovascular;  Laterality: N/A;   CARDIOVERSION N/A 01/18/2021   Procedure: CARDIOVERSION;  Surgeon: Chilton Si, MD;  Location: [facility] ENDOSCOPY;  Service: Cardiovascular;  Laterality: N/A;   CARDIOVERSION N/A 06/17/2021   Procedure: CARDIOVERSION;  Surgeon: Sande Rives, MD;  Location: [facility] ENDOSCOPY;  Service: Cardiovascular;  Laterality: N/A;   Cataract surgery Right    COLONOSCOPY     COLONOSCOPY N/A 12/30/2017   Procedure: COLONOSCOPY;  Surgeon: Malissa Hippo, MD;  [facility];  Service: Endoscopy;  Laterality: N/A;   CORONARY ARTERY BYPASS GRAFT N/A 05/22/2014   Procedure: CORONARY ARTERY BYPASS GRAFTING (CABG) times three using left internal mammary and right saphenous vein.;  Surgeon: Loreli Slot, MD;  Location: [facility] OR;  Service: Open Heart Surgery;  Laterality: N/A;   ENDARTERECTOMY Left 02/17/2014   Procedure: ENDARTERECTOMY CAROTID WITH PATCH  ANGIOPLASTY;  Surgeon: Pryor Ochoa, MD;  Location: [facility] OR;  Service: Vascular;  Laterality: Left;   ESOPHAGEAL DILATION N/A 08/22/2015   Procedure: ESOPHAGEAL DILATION;  Surgeon: Malissa Hippo, MD;  [facility];  Service: Endoscopy;  Laterality: N/A;   ESOPHAGOGASTRODUODENOSCOPY N/A 08/22/2015   Procedure: ESOPHAGOGASTRODUODENOSCOPY (EGD);  Surgeon: Malissa Hippo, MD;  [facility];  Service: Endoscopy;  Laterality: N/A;  12:45 - moved to 1:55 - Ann notified pt   ESOPHAGOGASTRODUODENOSCOPY N/A 12/30/2017   Procedure: ESOPHAGOGASTRODUODENOSCOPY (EGD);  Surgeon: Malissa Hippo, MD;  [facility];  Service: Endoscopy;  Laterality: N/A;  200   EYE SURGERY     cataract extraction (right) with repair macular tear , with IOL     right   FRACTURE SURGERY     bilateral wrist fractures- one ORIF   IR RADIOLOGIST EVAL & MGMT  12/01/2022   JOINT REPLACEMENT  2011   left knee   LEFT HEART CATHETERIZATION WITH CORONARY ANGIOGRAM N/A 05/22/2014   Procedure: LEFT HEART CATHETERIZATION WITH CORONARY ANGIOGRAM;  Surgeon: Marykay Lex, MD;  Location: [facility] CATH LAB;  Service: Cardiovascular;  Laterality: N/A;   LITHOTRIPSY  PARS PLANA VITRECTOMY W/ REPAIR OF MACULAR HOLE     RHINOPLASTY     TEE WITHOUT CARDIOVERSION N/A 04/29/2017   Procedure: TRANSESOPHAGEAL ECHOCARDIOGRAM (TEE) WITH PROPOFOL;  Surgeon: Jonelle Sidle, MD;  Location[facility];  Service: Cardiovascular;  Laterality: N/A;   TONSILLECTOMY     TOTAL HIP ARTHROPLASTY  12/08/2011   Procedure: TOTAL HIP ARTHROPLASTY;  Surgeon: Loanne Drilling, MD;  Location: [facility] ORS;  Service: Orthopedics;  Laterality: Right;   UPPER GI ENDOSCOPY  12/18/2015   Procedure: UPPER GI ENDOSCOPY;  Surgeon: Axel Filler, MD;  Location: [facility] ORS;  Service: General;;   WRIST SURGERY Right 83yrs ago   WRIST SURGERY Left     Family History  Problem Relation Age of Onset   Allergies Mother    Heart disease Mother     Hypertension Mother    Heart disease Father        MI      Social History   Socioeconomic History   Marital status: Married    Spouse name: Not on file   Number of children: Not on file   Years of education: Not on file   Highest education level: Not on file  Occupational History   Occupation: retired    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  Tobacco Use   Smoking status: Former    Current packs/day: 0.00    Average packs/day: 1.5 packs/day for 25.0 years (37.5 ttl pk-yrs)    Types: Cigarettes    Start date: 08/08/1960    Quit date: 06/02/1982    Years since quitting: 40.6    Passive exposure: Never   Smokeless tobacco: Never   Tobacco comments:    quit smoking 30+yrs ago  Vaping Use   Vaping status: Never Used  Substance and Sexual Activity   Alcohol use: No    Alcohol/week: 0.0 standard drinks of alcohol    Comment: occasionally    Drug use: No   Sexual activity: Yes  Other Topics Concern   Not on file  Social History Narrative   Not on file   Social Determinants of Health   Financial Resource Strain: Not on file  Food Insecurity: No Food Insecurity (01/14/2023)   Hunger Vital Sign    Worried About Running Out of Food in the Last Year: Never true    Ran Out of Food in the Last Year: Never true  Transportation Needs: No Transportation Needs (01/14/2023)   PRAPARE - Administrator, Civil Service (Medical): No    Lack of Transportation (Non-Medical): No  Physical Activity: Not on file  Stress: Not on file  Social Connections: Not on file    Allergies  Allergen Reactions   Codeine Sulfate Nausea Only     Current Outpatient Medications:    acetaminophen (TYLENOL) 500 MG tablet, Take 1,000 mg by mouth every 6 (six) hours as needed for moderate pain., Disp: , Rfl:    amiodarone (PACERONE) 200 MG tablet, Take 1 tablet (200 mg total) by mouth daily., Disp: 90 tablet, Rfl: 1   atorvastatin (LIPITOR) 80 MG tablet, TAKE 1 TABLET BY MOUTH EVERY DAY IN THE  EVENING, Disp: 90 tablet, Rfl: 3   Cholecalciferol (VITAMIN D) 50 MCG (2000 UT) CAPS, Take 2,000 Units by mouth daily., Disp: , Rfl:    cyanocobalamin (VITAMIN B12) 1000 MCG tablet, Take 1,000 mcg by mouth See admin instructions. 2-3 TIMES WEEKLY PER PT'S WIFE, Disp: , Rfl:    doxazosin (CARDURA) 4 MG tablet, Take  4 mg by mouth in the morning., Disp: , Rfl: 4   empagliflozin (JARDIANCE) 10 MG TABS tablet, Take 1 tablet (10 mg total) by mouth daily before breakfast., Disp: 30 tablet, Rfl: 11   feeding supplement (BOOST HIGH PROTEIN) LIQD, Take 1 Container by mouth at bedtime. One shake before bedtime., Disp: , Rfl:    finasteride (PROSCAR) 5 MG tablet, Take 5 mg by mouth every evening. , Disp: , Rfl:    fluticasone (FLONASE) 50 MCG/ACT nasal spray, Place 1 spray into both nostrils daily as needed for allergies or rhinitis., Disp: , Rfl:    furosemide (LASIX) 80 MG tablet, Take 1 tablet (80 mg total) by mouth 2 (two) times daily., Disp: 180 tablet, Rfl: 3   lactulose (CHRONULAC) 10 GM/15ML solution, Take 30 mLs (20 g total) by mouth 2 (two) times daily. Can increase to twice a day depending on constipation improvement (Patient taking differently: Take 20 g by mouth 3 (three) times daily.), Disp: 1892 mL, Rfl: 2   nitroGLYCERIN (NITROSTAT) 0.4 MG SL tablet, Place 1 tablet (0.4 mg total) under the tongue every 5 (five) minutes x 3 doses as needed for chest pain., Disp: 75 tablet, Rfl: 3   pantoprazole (PROTONIX) 40 MG tablet, Take 1 tablet (40 mg total) by mouth daily before breakfast., Disp: 90 tablet, Rfl: 3   Rivaroxaban (XARELTO) 15 MG TABS tablet, Take 1 tablet (15 mg total) by mouth daily with supper., Disp: 30 tablet, Rfl: 1   spironolactone (ALDACTONE) 25 MG tablet, Take 1 tablet (25 mg total) by mouth daily., Disp: 90 tablet, Rfl: 0   sulfamethoxazole-trimethoprim (BACTRIM DS) 800-160 MG tablet, Take 1 tablet by mouth daily., Disp: 90 tablet, Rfl: 3   Review of Systems  Constitutional:   Negative for activity change, appetite change, chills, diaphoresis, fatigue, fever and unexpected weight change.  HENT:  Negative for congestion, rhinorrhea, sinus pressure, sneezing, sore throat and trouble swallowing.   Eyes:  Negative for photophobia and visual disturbance.  Respiratory:  Negative for cough, chest tightness, shortness of breath, wheezing and stridor.   Cardiovascular:  Negative for chest pain, palpitations and leg swelling.  Gastrointestinal:  Negative for abdominal distention, abdominal pain, anal bleeding, blood in stool, constipation, diarrhea, nausea and vomiting.  Genitourinary:  Negative for difficulty urinating, dysuria, flank pain and hematuria.  Musculoskeletal:  Negative for arthralgias, back pain, gait problem, joint swelling and myalgias.  Skin:  Negative for color change, pallor, rash and wound.  Neurological:  Negative for dizziness, tremors, weakness and light-headedness.  Hematological:  Negative for adenopathy. Does not bruise/bleed easily.  Psychiatric/Behavioral:  Negative for agitation, behavioral problems, confusion, decreased concentration, dysphoric mood and sleep disturbance.        Objective:   Physical Exam Constitutional:      Appearance: He is well-developed.  HENT:     Head: Normocephalic and atraumatic.  Eyes:     Conjunctiva/sclera: Conjunctivae normal.  Cardiovascular:     Rate and Rhythm: Normal rate and regular rhythm.  Pulmonary:     Effort: Pulmonary effort is normal. No respiratory distress.     Breath sounds: No wheezing.  Abdominal:     General: There is distension.     Palpations: Abdomen is soft.  Musculoskeletal:        General: No tenderness. Normal range of motion.     Cervical back: Normal range of motion and neck supple.  Skin:    General: Skin is warm and dry.     Coloration: Skin  is not pale.     Findings: No erythema or rash.  Neurological:     General: No focal deficit present.     Mental Status: He is alert  and oriented to person, place, and time.  Psychiatric:        Mood and Affect: Mood normal.        Behavior: Behavior normal.        Thought Content: Thought content normal.        Judgment: Judgment normal.           Assessment & Plan:   Elevated WBC in peritoneal fluild:  I do NOT think this is due to a bacterial infection.  No bacteria is ever grown on his 7 or 8 paracenteses.  He has never had symptoms that sounded consistent with SBP.  And thinking more broadly about other things that could cause elevation of the white blood cell count and the peritoneum I have wondered about tuberculosis though he does not have much risk factors at all or much of his story for this.  I will have her get a QuantiFERON gold today as well as check his HIV serostatus though he will highly likely be seronegative.  I have also ordered an ultrasound which can hopefully be the next ultrasound with paracentesis with plans to have radiology remove a large amount of fluid in send the vast majority of it at least a liter for AFB stain and culture and also a large volume of it for cytology as well as fluid for routine cultures bacterial cultures and cell count and differential.  I have personally spent 82 minutes involved in face-to-face and non-face-to-face activities for this patient on the day of the visit. Professional time spent includes the following activities: Preparing to see the patient (review of tests), Obtaining and/or reviewing separately obtained history (admission/discharge record), Performing a medically appropriate examination and/or evaluation , Ordering medications/tests/procedures, referring and communicating with other health care professionals, Documenting clinical information in the EMR, Independently interpreting results (not separately reported), Communicating results to the patient/family/caregiver, Counseling and educating the patient/family/caregiver and Care coordination (not  separately reported).

## 2023-01-21 NOTE — Telephone Encounter (Signed)
Apt sch'd 01/27/23 at 915, left detailed message about patient

## 2023-01-22 LAB — CYTOLOGY - NON PAP

## 2023-01-22 NOTE — Telephone Encounter (Signed)
Caleb Wolf spoke with pt

## 2023-01-23 LAB — QUANTIFERON-TB GOLD PLUS
Mitogen-NIL: 0.45 [IU]/mL
NIL: 0.01 [IU]/mL
QuantiFERON-TB Gold Plus: UNDETERMINED — AB
TB1-NIL: 0 [IU]/mL
TB2-NIL: 0 [IU]/mL

## 2023-01-23 LAB — HIV ANTIBODY (ROUTINE TESTING W REFLEX): HIV 1&2 Ab, 4th Generation: NONREACTIVE

## 2023-01-24 ENCOUNTER — Emergency Department (HOSPITAL_COMMUNITY): Payer: Medicare HMO

## 2023-01-24 ENCOUNTER — Emergency Department (HOSPITAL_COMMUNITY)
Admission: EM | Admit: 2023-01-24 | Discharge: 2023-01-25 | Disposition: A | Discharge status: Home / Self Care | Payer: Medicare HMO | Attending: Emergency Medicine | Admitting: Emergency Medicine

## 2023-01-24 ENCOUNTER — Other Ambulatory Visit: Payer: Self-pay

## 2023-01-24 ENCOUNTER — Encounter (HOSPITAL_COMMUNITY): Payer: Self-pay

## 2023-01-24 DIAGNOSIS — I4891 Unspecified atrial fibrillation: Secondary | ICD-10-CM | POA: Insufficient documentation

## 2023-01-24 DIAGNOSIS — Z7901 Long term (current) use of anticoagulants: Secondary | ICD-10-CM | POA: Insufficient documentation

## 2023-01-24 DIAGNOSIS — S6991XA Unspecified injury of right wrist, hand and finger(s), initial encounter: Secondary | ICD-10-CM | POA: Diagnosis not present

## 2023-01-24 DIAGNOSIS — S0083XA Contusion of other part of head, initial encounter: Secondary | ICD-10-CM | POA: Insufficient documentation

## 2023-01-24 DIAGNOSIS — R0789 Other chest pain: Secondary | ICD-10-CM | POA: Insufficient documentation

## 2023-01-24 DIAGNOSIS — S60511A Abrasion of right hand, initial encounter: Secondary | ICD-10-CM | POA: Insufficient documentation

## 2023-01-24 DIAGNOSIS — S4981XA Other specified injuries of right shoulder and upper arm, initial encounter: Secondary | ICD-10-CM | POA: Diagnosis not present

## 2023-01-24 DIAGNOSIS — G319 Degenerative disease of nervous system, unspecified: Secondary | ICD-10-CM | POA: Diagnosis not present

## 2023-01-24 DIAGNOSIS — I959 Hypotension, unspecified: Secondary | ICD-10-CM | POA: Diagnosis not present

## 2023-01-24 DIAGNOSIS — M19031 Primary osteoarthritis, right wrist: Secondary | ICD-10-CM | POA: Diagnosis not present

## 2023-01-24 DIAGNOSIS — S022XXA Fracture of nasal bones, initial encounter for closed fracture: Secondary | ICD-10-CM | POA: Diagnosis not present

## 2023-01-24 DIAGNOSIS — W19XXXA Unspecified fall, initial encounter: Secondary | ICD-10-CM | POA: Diagnosis not present

## 2023-01-24 DIAGNOSIS — S62616A Displaced fracture of proximal phalanx of right little finger, initial encounter for closed fracture: Secondary | ICD-10-CM | POA: Insufficient documentation

## 2023-01-24 DIAGNOSIS — N189 Chronic kidney disease, unspecified: Secondary | ICD-10-CM | POA: Diagnosis not present

## 2023-01-24 DIAGNOSIS — S62646A Nondisplaced fracture of proximal phalanx of right little finger, initial encounter for closed fracture: Secondary | ICD-10-CM

## 2023-01-24 DIAGNOSIS — S50811A Abrasion of right forearm, initial encounter: Secondary | ICD-10-CM | POA: Diagnosis not present

## 2023-01-24 DIAGNOSIS — S0003XA Contusion of scalp, initial encounter: Secondary | ICD-10-CM | POA: Diagnosis not present

## 2023-01-24 DIAGNOSIS — I6521 Occlusion and stenosis of right carotid artery: Secondary | ICD-10-CM | POA: Diagnosis not present

## 2023-01-24 DIAGNOSIS — S2231XA Fracture of one rib, right side, initial encounter for closed fracture: Secondary | ICD-10-CM

## 2023-01-24 DIAGNOSIS — M25551 Pain in right hip: Secondary | ICD-10-CM | POA: Diagnosis not present

## 2023-01-24 DIAGNOSIS — M7989 Other specified soft tissue disorders: Secondary | ICD-10-CM | POA: Diagnosis not present

## 2023-01-24 DIAGNOSIS — I2510 Atherosclerotic heart disease of native coronary artery without angina pectoris: Secondary | ICD-10-CM | POA: Insufficient documentation

## 2023-01-24 DIAGNOSIS — R609 Edema, unspecified: Secondary | ICD-10-CM | POA: Diagnosis not present

## 2023-01-24 DIAGNOSIS — Z743 Need for continuous supervision: Secondary | ICD-10-CM | POA: Diagnosis not present

## 2023-01-24 DIAGNOSIS — I10 Essential (primary) hypertension: Secondary | ICD-10-CM | POA: Diagnosis not present

## 2023-01-24 LAB — CULTURE, BODY FLUID W GRAM STAIN -BOTTLE: Culture: NO GROWTH

## 2023-01-24 LAB — COMPREHENSIVE METABOLIC PANEL
ALT: 29 U/L (ref 0–44)
AST: 23 U/L (ref 15–41)
Albumin: 2.9 g/dL — ABNORMAL LOW (ref 3.5–5.0)
Alkaline Phosphatase: 72 U/L (ref 38–126)
Anion gap: 10 (ref 5–15)
BUN: 49 mg/dL — ABNORMAL HIGH (ref 8–23)
CO2: 24 mmol/L (ref 22–32)
Calcium: 7.8 mg/dL — ABNORMAL LOW (ref 8.9–10.3)
Chloride: 97 mmol/L — ABNORMAL LOW (ref 98–111)
Creatinine, Ser: 2.34 mg/dL — ABNORMAL HIGH (ref 0.61–1.24)
GFR, Estimated: 27 mL/min — ABNORMAL LOW (ref >60)
Glucose, Bld: 106 mg/dL — ABNORMAL HIGH (ref 70–99)
Potassium: 3.6 mmol/L (ref 3.5–5.1)
Sodium: 131 mmol/L — ABNORMAL LOW (ref 135–145)
Total Bilirubin: 0.3 mg/dL (ref 0.3–1.2)
Total Protein: 5.6 g/dL — ABNORMAL LOW (ref 6.5–8.1)

## 2023-01-24 LAB — CBC WITH DIFFERENTIAL/PLATELET
Abs Immature Granulocytes: 0.11 10*3/uL — ABNORMAL HIGH (ref 0.00–0.07)
Basophils Absolute: 0.1 10*3/uL (ref 0.0–0.1)
Basophils Relative: 1 %
Eosinophils Absolute: 0 10*3/uL (ref 0.0–0.5)
Eosinophils Relative: 0 %
HCT: 30 % — ABNORMAL LOW (ref 39.0–52.0)
Hemoglobin: 9.5 g/dL — ABNORMAL LOW (ref 13.0–17.0)
Immature Granulocytes: 1 %
Lymphocytes Relative: 4 %
Lymphs Abs: 0.4 10*3/uL — ABNORMAL LOW (ref 0.7–4.0)
MCH: 28 pg (ref 26.0–34.0)
MCHC: 31.7 g/dL (ref 30.0–36.0)
MCV: 88.5 fL (ref 80.0–100.0)
Monocytes Absolute: 0.7 10*3/uL (ref 0.1–1.0)
Monocytes Relative: 8 %
Neutro Abs: 7.3 10*3/uL (ref 1.7–7.7)
Neutrophils Relative %: 86 %
Platelets: 287 10*3/uL (ref 150–400)
RBC: 3.39 MIL/uL — ABNORMAL LOW (ref 4.22–5.81)
RDW: 16 % — ABNORMAL HIGH (ref 11.5–15.5)
WBC: 8.5 10*3/uL (ref 4.0–10.5)
nRBC: 0 % (ref 0.0–0.2)

## 2023-01-24 MED ORDER — SODIUM CHLORIDE 0.9 % IV BOLUS
500.0000 mL | Freq: Once | INTRAVENOUS | Status: AC
  Administered 2023-01-25: 500 mL via INTRAVENOUS

## 2023-01-24 MED ORDER — BACITRACIN ZINC 500 UNIT/GM EX OINT
TOPICAL_OINTMENT | Freq: Two times a day (BID) | CUTANEOUS | Status: DC
  Administered 2023-01-25: 1 via TOPICAL
  Filled 2023-01-24: qty 3.6

## 2023-01-24 NOTE — ED Triage Notes (Addendum)
Pt in c-collar. With bruising to right side of forehead, skin tear on nose and right elbow. Only complains of right elbow pain at this time.

## 2023-01-24 NOTE — ED Notes (Signed)
Patient transported to X-ray 

## 2023-01-24 NOTE — ED Triage Notes (Signed)
Pt to ED via [facility] from home (lives with wife) after an unwitnessed fall while trying to use the bathroom. Pt endorses tripping but unclear if LOC occurred. Pt a/o x4 on ems arrival. Ems reports pt is on blood thinners.

## 2023-01-24 NOTE — ED Provider Notes (Signed)
[place] EMERGENCY DEPARTMENT AT [facility] Provider Note   CSN: [id number] Arrival date & time: 01/24/23  2058     History {Add pertinent medical, surgical, social history, OB history to HPI:1} Chief Complaint  Patient presents with   Caleb Wolf is a 82 y.o. male with past medical history of A-fib on Xarelto, CKD, CAD, HLD, GERD is presenting after unwitnessed fall.  Patient's wife is present at bedside reporting that she was in the other room when she went to check on him and noticed that he was on the floor of the bathroom he was not conscious and immediately called EMS.  Reports that he hit his head and was laying on his right hip.  Patient is on a blood thinner.  Patient does not recall the event and he does not recall how he fell.  His wife reports that he has returned to baseline.  In room the patient is reporting no pain.  He denies headache denies chest pain or shortness of breath.   Fall       Home Medications Prior to Admission medications   Medication Sig Start Date End Date Taking? Authorizing Provider  acetaminophen (TYLENOL) 500 MG tablet Take 1,000 mg by mouth every 6 (six) hours as needed for moderate pain.    [provider]  amiodarone (PACERONE) 200 MG tablet Take 1 tablet (200 mg total) by mouth daily. 10/24/22   Jake Bathe, MD  atorvastatin (LIPITOR) 80 MG tablet TAKE 1 TABLET BY MOUTH EVERY DAY IN THE EVENING 04/03/22   Jake Bathe, MD  Cholecalciferol (VITAMIN D) 50 MCG (2000 UT) CAPS Take 2,000 Units by mouth daily.    [provider]  cyanocobalamin (VITAMIN B12) 1000 MCG tablet Take 1,000 mcg by mouth See admin instructions. 2-3 TIMES WEEKLY PER PT'S WIFE    [provider]  doxazosin (CARDURA) 4 MG tablet Take 4 mg by mouth in the morning. 07/06/17   [provider]  empagliflozin (JARDIANCE) 10 MG TABS tablet Take 1 tablet (10 mg total) by mouth daily before breakfast. 06/11/22   Jake Bathe,  MD  feeding supplement (BOOST HIGH PROTEIN) LIQD Take 1 Container by mouth at bedtime. One shake before bedtime.    [provider]  finasteride (PROSCAR) 5 MG tablet Take 5 mg by mouth every evening.     [provider]  fluticasone (FLONASE) 50 MCG/ACT nasal spray Place 1 spray into both nostrils daily as needed for allergies or rhinitis.    [provider]  furosemide (LASIX) 80 MG tablet Take 1 tablet (80 mg total) by mouth 2 (two) times daily. 06/11/22   Jake Bathe, MD  lactulose (CHRONULAC) 10 GM/15ML solution Take 30 mLs (20 g total) by mouth 2 (two) times daily. Can increase to twice a day depending on constipation improvement Patient taking differently: Take 20 g by mouth 3 (three) times daily. 12/22/22   Carlan, Chelsea L, NP  nitroGLYCERIN (NITROSTAT) 0.4 MG SL tablet Place 1 tablet (0.4 mg total) under the tongue every 5 (five) minutes x 3 doses as needed for chest pain. 06/04/20   Allred, Fayrene Fearing, MD  pantoprazole (PROTONIX) 40 MG tablet Take 1 tablet (40 mg total) by mouth daily before breakfast. 02/19/16   Rehman, Joline Maxcy, MD  Rivaroxaban (XARELTO) 15 MG TABS tablet Take 1 tablet (15 mg total) by mouth daily with supper. 12/16/21   Catarina Hartshorn, MD  spironolactone (ALDACTONE) 25 MG  tablet Take 1 tablet (25 mg total) by mouth daily. 11/10/22   Dolores Frame, MD  sulfamethoxazole-trimethoprim (BACTRIM DS) 800-160 MG tablet Take 1 tablet by mouth daily. 01/08/23   Dolores Frame, MD      Allergies    Codeine sulfate    Review of Systems   Review of Systems  Skin:  Positive for wound.    Physical Exam Updated Vital Signs BP (!) 129/59 (BP Location: Left Arm)   Pulse (!) 59   Temp 98.5 F (36.9 C) (Oral)   Resp 20   Ht 5\' 9"  (1.753 m)   Wt 74.8 kg   SpO2 97%   BMI 24.37 kg/m  Physical Exam Vitals and nursing note reviewed.  Constitutional:      General: He is not in acute distress.    Appearance: He is not toxic-appearing.   HENT:     Head: Normocephalic.     Comments: Ecchymosis and edema over right temporal area Eyes:     General: No scleral icterus.    Conjunctiva/sclera: Conjunctivae normal.  Cardiovascular:     Rate and Rhythm: Normal rate and regular rhythm.     Pulses: Normal pulses.     Heart sounds: Normal heart sounds.     Comments: Pain to right side of chest wall w/ palpation  Pulmonary:     Effort: Pulmonary effort is normal. No respiratory distress.     Breath sounds: Normal breath sounds.  Abdominal:     General: Abdomen is flat. Bowel sounds are normal.     Palpations: Abdomen is soft.     Tenderness: There is no abdominal tenderness.  Musculoskeletal:     Cervical back: No tenderness.  Skin:    General: Skin is warm and dry.     Findings: No lesion.     Comments: Multiple small abrasions of right hand and arm  Neurological:     General: No focal deficit present.     Mental Status: He is alert and oriented to person, place, and time. Mental status is at baseline.     ED Results / Procedures / Treatments   Labs (all labs ordered are listed, but only abnormal results are displayed) Labs Reviewed  CBC WITH DIFFERENTIAL/PLATELET - Abnormal; Notable for the following components:      Result Value   RBC 3.39 (*)    Hemoglobin 9.5 (*)    HCT 30.0 (*)    RDW 16.0 (*)    Lymphs Abs 0.4 (*)    Abs Immature Granulocytes 0.11 (*)    All other components within normal limits  COMPREHENSIVE METABOLIC PANEL - Abnormal; Notable for the following components:   Sodium 131 (*)    Chloride 97 (*)    Glucose, Bld 106 (*)    BUN 49 (*)    Creatinine, Ser 2.34 (*)    Calcium 7.8 (*)    Total Protein 5.6 (*)    Albumin 2.9 (*)    GFR, Estimated 27 (*)    All other components within normal limits  URINALYSIS, ROUTINE W REFLEX MICROSCOPIC    EKG None  Radiology No results found.  Procedures Procedures  {Document cardiac monitor, telemetry assessment procedure when  appropriate:1}  Medications Ordered in ED Medications - No data to display  ED Course/ Medical Decision Making/ A&P   {   Click here for ABCD2, HEART and other calculatorsREFRESH Note before signing :1}  Medical Decision Making Amount and/or Complexity of Data Reviewed Labs: ordered. Radiology: ordered.  Risk OTC drugs.   This patient presents to the ED for concern of fall, this involves an extensive number of treatment options, and is a complaint that carries with it a high risk of complications and morbidity.  The differential diagnosis includes head bleed, contusion, concussion, arrhythmia, electrolyte abnormality, UTI    Co morbidities that complicate the patient evaluation  Afib on blood thinner CAD   Additional history obtained:  Additional history obtained from pts wife     Lab Tests:  I Ordered, and personally interpreted labs.  The pertinent results include:   Cbc Cmp UA   Imaging Studies ordered:  I ordered imaging studies including CT head and c spine   I independently visualized and interpreted imaging which showed no acute intracranial bleed or fracture I agree with the radiologist interpretation Hand xray, hip right and u/l chest right [facility]    Cardiac Monitoring: / EKG:  The patient was maintained on a cardiac monitor.  I personally viewed and interpreted the cardiac monitored which showed an underlying rhythm of: sinus, no st elevation    Consultations Obtained:  None    Problem List / ED Course / Critical interventions / Medication management  Pt presenting with LOC after fall on blood thinner. Fall was unwitnessed, wife was present shortly after fall and reports he was not concious. By the time EMS arrived pt was back to baseline. Pt does not remember the fall or why it happenes. Vitals are stable on arrival and he is reporting no pain.  On re-assessment, pt reporting right hand pain and pain to right hip -  xrays were ordered: PENDING  Reevaluation of the patient after these medicines showed that the patient improved I have reviewed the patients home medicines and have made adjustments as needed   Plan D/c if patient can ambulate safely  Send for home health PT, OT, SW to contact patient for eval of safe ambulation  F/u w/ prior care regarding imaging results and for full resolution of sx  Patient was signed off to oncoming ED provider, right hand x-ray still pending.   {Document critical care time when appropriate:1} {Document review of labs and clinical decision tools ie heart score, Chads2Vasc2 etc:1}  {Document your independent review of radiology images, and any outside records:1} {Document your discussion with family members, caretakers, and with consultants:1} {Document social determinants of health affecting pt's care:1} {Document your decision making why or why not admission, treatments were needed:1} Final Clinical Impression(s) / ED Diagnoses Final diagnoses:  None    Rx / DC Orders ED Discharge Orders     None

## 2023-01-24 NOTE — ED Provider Notes (Signed)
Patient is an elderly 82 year old male, presents with his wife after having an unwitnessed fall, she heard him fall from the next room and went immediately and there, found to be on the floor with a hematoma to the right head, skin tears to the right arm and some pain and swelling around the right small finger.  He has on my exam a soft nontender abdomen but tenderness over the right ribs, tenderness over the right small finger, tenderness over the right side of the head and is very tiny abrasion to the nasal bridge.  Lungs are clear, heart is regular, no edema to the legs and he has full range of motion of the bilateral lower extremities.  Patient will need imaging to rule out fractures or injuries, EKG was unremarkable, labs show that there is some renal insufficiency but this is not significant compared to prior.  Will give a little bit of IV fluid.   Eber Hong, MD 01/26/23 [phone]

## 2023-01-24 NOTE — ED Notes (Signed)
Patient transported to CT 

## 2023-01-25 ENCOUNTER — Telehealth: Payer: Self-pay

## 2023-01-25 LAB — URINALYSIS, ROUTINE W REFLEX MICROSCOPIC
Bacteria, UA: NONE SEEN
Bilirubin Urine: NEGATIVE
Glucose, UA: >=500 mg/dL — AB
Ketones, ur: NEGATIVE mg/dL
Leukocytes,Ua: NEGATIVE
Nitrite: NEGATIVE
Protein, ur: NEGATIVE mg/dL
Specific Gravity, Urine: 1.01 (ref 1.005–1.030)
pH: 5 (ref 5.0–8.0)

## 2023-01-25 MED ORDER — OXYCODONE-ACETAMINOPHEN 5-325 MG PO TABS
1.0000 | ORAL_TABLET | Freq: Four times a day (QID) | ORAL | 0 refills | Status: DC | PRN
Start: 2023-01-25 — End: 2023-01-27

## 2023-01-25 MED ORDER — OXYCODONE-ACETAMINOPHEN 5-325 MG PO TABS
1.0000 | ORAL_TABLET | Freq: Four times a day (QID) | ORAL | 0 refills | Status: DC | PRN
Start: 2023-01-25 — End: 2023-07-08

## 2023-01-25 NOTE — ED Notes (Signed)
Non stick dressing and Gauze wrap applied to skin-tears to the right shoulder, elbow, and lower leg.

## 2023-01-25 NOTE — Telephone Encounter (Signed)
Patient discharged last night from [facility] with orders for home health. Called [facility] - contact Becky Sax and they accepted for services They will call the patient

## 2023-01-25 NOTE — ED Notes (Signed)
Ambulated pt down the hall and back to the room with his nurse.  Pt states feels his normal, no dizziness or lightheadedness. Pt balance a little unsteady, but pt and wife states that is normal, and that he usually reaches for the wall at home. MD made aware

## 2023-01-26 MED FILL — Oxycodone w/ Acetaminophen Tab 5-325 MG: ORAL | Qty: 6 | Status: AC

## 2023-01-27 ENCOUNTER — Ambulatory Visit (INDEPENDENT_AMBULATORY_CARE_PROVIDER_SITE_OTHER): Payer: Medicare HMO | Admitting: Gastroenterology

## 2023-01-27 ENCOUNTER — Encounter (INDEPENDENT_AMBULATORY_CARE_PROVIDER_SITE_OTHER): Payer: Self-pay | Admitting: Gastroenterology

## 2023-01-27 VITALS — BP 138/54 | HR 53 | Temp 97.1°F | Ht 69.0 in | Wt 168.2 lb

## 2023-01-27 DIAGNOSIS — R188 Other ascites: Secondary | ICD-10-CM

## 2023-01-27 DIAGNOSIS — K7460 Unspecified cirrhosis of liver: Secondary | ICD-10-CM | POA: Diagnosis not present

## 2023-01-27 NOTE — Patient Instructions (Addendum)
-  Let's increase lactulose to 45mL (30g) every 1-2 hours until you have a BM then you can resume 45g three times per day, if you have not had a BM by tomorrow  -Let's continue with Bactrim daily, lasix and spironolactone at current doses -please make me aware of belly pain, fevers/chills  -Please let me know if fluid in your belly is increasing prior to your para on 9/4 so that we can get you in sooner if needed -It appears home health should be reaching out after initiation for PT/OT in the ED  - Reduce salt intake to <2 g per day - Can take Tylenol max of 2 g per day (650 mg q8h) for pain - Avoid NSAIDs for pain - Avoid eating raw oysters/shellfish - Ensure every night before going to sleep  Follow up 6-8 weeks

## 2023-01-27 NOTE — Progress Notes (Unsigned)
Referring Provider: Carylon Perches, MD Primary Care Physician:  Carylon Perches, MD Primary GI Physician: Dr. Levon Hedger   Chief Complaint  Patient presents with   Hospitalization Follow-up    Patient here today due to several recent ed visits, one on 01/14/2023 and 01/24/2023. 01/14/2023 visit was due to Spontaneous bacterial peritonitis, and the 01/24/2023 visit was due to a fall which resulted in being unconscious and broken rib and finger.     HPI:   RUMI FLEXER is a 82 y.o. 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   Para on 7/25 with PMN >250, culture negative, received IV abx. Completed vanitin as outpaitent, started cipro for prophylaxis, though due to prolonged QTc, he was switched to bactrim DS daily on 8/8. Presented again on 8/15 due to ongoing PMN >250. Bactrim failure not suspected, he received IV abx again. He was discharged on Bactrim daily. He had repeat para on 8/19, again with PMN >250, higher than previous. He was referred to ID for concern of other underlying cause of ascitic fluid showing continued SBP.   Saw ID 8/21, they did not feel that elevated counts in peritoneal fluid were secondary to bacterial infection or consistent with SBP. They ordered TB/HIV testing and requested to have atleast a liter of para fluid from next para sent off for AFB stain/culture, cytology for further evaluation.   Last labs on 8/24 with sodium 131, K 3.6, creat 2.34, albumin 2.9, CBC with hgb 9.5, PLT count 287k  HIV testing on 8/21 was negative, TB indeterminate   He is undergoing evaluation for possible TIPS but this has been delayed due to recent hospital  admissions  He is currently on lactulose 45g TID, spironolactone 25mg  daily, lasix 120mg /160mg  alternating each day, Bactrim DS daily  Present:  He notes he had a fall on 8/24, wife states she found him unconscious in the bathroom, unsure if he blacked out prior to fall or after. Was evaluated in the ED. He did suffer a broken finger and a broken rib. He had another fall last night. He denies dizziness or lightheadedness. Feels that his legs are very week. PT/OT as outpatient appears to have been ordered during his ED visit.   His weight is 165lbs today, last weight at hospital was 162. He notes he has not really paid attention to the ascites recently, his wife [name] states she keeps a close eye on it, abdomen is mildly distended. He does not feel it is tight or uncomfortable. Denies abdominal pain.   He has not had a BM since 8/23. He has had a lot gas. He missed 1 dose of lactulose while he was in the hospital but otherwise taking compliantly. They had been told previously to increase lactulose to 45mg  TID. Was having a BM daily to every other day prior to most recently. Feels he has been eating decently though maybe slightly less since his fall. They are trying to keep PO fluid intake well balanced, following 2g sodium diet.  She has not noted any disorientation, he is alert and oriented x4 with No asterixis on exam today  He has upcoming paracentesis scheduled for 9/4, ID is going to use all fluid for testing.  He has percocet for pain after his fall but this has been used very conservatively, his wife is helping to manage this for him.    Cirrhosis related questions: Hematemesis/coffee ground emesis: No Abdominal pain: No Abdominal distention/worsening ascites: Yes Fever/chills: No Episodes of confusion/disorientation: none Number of daily bowel movements:as above, none since 9/23, previously daily to every other day Taking diuretics?: Yes, lasix 120/160mg  alternating every other  day, aldactone 25mg  daily History of variceal bleeding: No Prior history of banding?: No Prior episodes of SBP: yes Last time liver imaging was performed: 12/24/22 via CT Last AFP: 2.8 on 11/18/22 MELD score: 28 Currently consuming alcohol: No   Last EGD: 12/30/2017,mid esophagus was dilated,Partial fundal wrap, presence of lipoma in the second portion of the duodenum. Last Colonoscopy: 12/30/2017, small polyps in the cecum which were removed biopsy, diverticulosis and external hemorrhoids. Pathology consistent with 1 tubular adenoma.   Recommended repeating a colonoscopy in 7 years   Past Medical History:  Diagnosis Date   AAA (abdominal aortic aneurysm) (HCC)    Arthritis    Basal cell carcinoma    Carotid stenosis    Chronic kidney disease, stage 3b (HCC) 12/13/2021   Coronary artery disease    Multvessel s/p CABG 2015   Enlarged prostate    Essential hypertension    GERD (gastroesophageal reflux disease)    Gout    History of colon polyps    History of kidney stones    Hyperlipidemia    Leukocytosis 01/21/2023   Lumbar disc disease    Persistent atrial fibrillation (HCC)    Sleep apnea    CPAP    Past Surgical History:  Procedure Laterality Date   ABLATION OF DYSRHYTHMIC FOCUS  07/28/2017   ATRIAL FIBRILLATION ABLATION N/A 07/28/2017   Procedure: ATRIAL FIBRILLATION ABLATION;  Surgeon: Hillis Range, MD;  Location: [facility] INVASIVE CV LAB;  Service: Cardiovascular;  Laterality: N/A;   ATRIAL FIBRILLATION ABLATION N/A 12/04/2020   Procedure: ATRIAL FIBRILLATION ABLATION;  Surgeon: Hillis Range, MD;  Location: [facility] INVASIVE CV LAB;  Service: Cardiovascular;  Laterality: N/A;   BACK SURGERY     CARDIAC CATHETERIZATION  05/22/2014   Procedure: IABP INSERTION;  Surgeon: Marykay Lex, MD;  Location: [facility] CATH LAB;  Service: Cardiovascular;;   CARDIOVERSION N/A 04/29/2017   Procedure: CARDIOVERSION;  Surgeon: Jonelle Sidle, MD;  Location: [facility] ENDO SUITE;  Service:  Cardiovascular;  Laterality: N/A;   CARDIOVERSION N/A 08/13/2017   Procedure: CARDIOVERSION;  Surgeon: Chrystie Nose, MD;  Location: [facility] ENDOSCOPY;  Service: Cardiovascular;  Laterality: N/A;   CARDIOVERSION N/A 08/15/2019   Procedure: CARDIOVERSION;  Surgeon: Lars Masson, MD;  Location: [facility] ENDOSCOPY;  Service: Cardiovascular;  Laterality: N/A;   CARDIOVERSION N/A 03/09/2020   Procedure: CARDIOVERSION;  Surgeon: Jodelle Red, MD;  Location: [facility] ENDOSCOPY;  Service: Cardiovascular;  Laterality: N/A;   CARDIOVERSION N/A 07/06/2020   Procedure: CARDIOVERSION;  Surgeon: Thurmon Fair, MD;  Location: [facility] ENDOSCOPY;  Service: Cardiovascular;  Laterality: N/A;   CARDIOVERSION N/A 01/18/2021   Procedure: CARDIOVERSION;  Surgeon: Chilton Si, MD;  Location: [facility] ENDOSCOPY;  Service: Cardiovascular;  Laterality: N/A;   CARDIOVERSION N/A 06/17/2021   Procedure: CARDIOVERSION;  Surgeon: Sande Rives, MD;  Location: [facility] ENDOSCOPY;  Service: Cardiovascular;  Laterality: N/A;   Cataract surgery Right    COLONOSCOPY     COLONOSCOPY N/A 12/30/2017   Procedure: COLONOSCOPY;  Surgeon: Malissa Hippo, MD;  [facility]: [facility];  Service: Endoscopy;  Laterality: N/A;  CORONARY ARTERY BYPASS GRAFT N/A 05/22/2014   Procedure: CORONARY ARTERY BYPASS GRAFTING (CABG) times three using left internal mammary and right saphenous vein.;  Surgeon: Loreli Slot, MD;  Location: [facility] OR;  Service: Open Heart Surgery;  Laterality: N/A;   ENDARTERECTOMY Left 02/17/2014   Procedure: ENDARTERECTOMY CAROTID WITH PATCH ANGIOPLASTY;  Surgeon: Pryor Ochoa, MD;  Location: [facility] OR;  Service: Vascular;  Laterality: Left;   ESOPHAGEAL DILATION N/A 08/22/2015   Procedure: ESOPHAGEAL DILATION;  Surgeon: Malissa Hippo, MD;  [facility];  Service: Endoscopy;  Laterality: N/A;   ESOPHAGOGASTRODUODENOSCOPY N/A 08/22/2015   Procedure: ESOPHAGOGASTRODUODENOSCOPY (EGD);  Surgeon: Malissa Hippo, MD;   [facility]: [facility];  Service: Endoscopy;  Laterality: N/A;  12:45 - moved to 1:55 - Ann notified pt   ESOPHAGOGASTRODUODENOSCOPY N/A 12/30/2017   Procedure: ESOPHAGOGASTRODUODENOSCOPY (EGD);  Surgeon: Malissa Hippo, MD;  Location: [facility];  Service: Endoscopy;  Laterality: N/A;  200   EYE SURGERY     cataract extraction (right) with repair macular tear , with IOL     right   FRACTURE SURGERY     bilateral wrist fractures- one ORIF   IR RADIOLOGIST EVAL & MGMT  12/01/2022   JOINT REPLACEMENT  2011   left knee   LEFT HEART CATHETERIZATION WITH CORONARY ANGIOGRAM N/A 05/22/2014   Procedure: LEFT HEART CATHETERIZATION WITH CORONARY ANGIOGRAM;  Surgeon: Marykay Lex, MD;  Location: [facility] CATH LAB;  Service: Cardiovascular;  Laterality: N/A;   LITHOTRIPSY     PARS PLANA VITRECTOMY W/ REPAIR OF MACULAR HOLE     RHINOPLASTY     TEE WITHOUT CARDIOVERSION N/A 04/29/2017   Procedure: TRANSESOPHAGEAL ECHOCARDIOGRAM (TEE) WITH PROPOFOL;  Surgeon: Jonelle Sidle, MD;  Location: [facility];  Service: Cardiovascular;  Laterality: N/A;   TONSILLECTOMY     TOTAL HIP ARTHROPLASTY  12/08/2011   Procedure: TOTAL HIP ARTHROPLASTY;  Surgeon: Loanne Drilling, MD;  Location: [facility] ORS;  Service: Orthopedics;  Laterality: Right;   UPPER GI ENDOSCOPY  12/18/2015   Procedure: UPPER GI ENDOSCOPY;  Surgeon: Axel Filler, MD;  Location: [facility] ORS;  Service: General;;   WRIST SURGERY Right 21yrs ago   WRIST SURGERY Left     Current Outpatient Medications  Medication Sig Dispense Refill   acetaminophen (TYLENOL) 500 MG tablet Take 1,000 mg by mouth every 6 (six) hours as needed for moderate pain.     amiodarone (PACERONE) 200 MG tablet Take 1 tablet (200 mg total) by mouth daily. 90 tablet 1   atorvastatin (LIPITOR) 80 MG tablet TAKE 1 TABLET BY MOUTH EVERY DAY IN THE EVENING 90 tablet 3   Cholecalciferol (VITAMIN D) 50 MCG (2000 UT) CAPS Take 2,000 Units by mouth daily.     cyanocobalamin (VITAMIN B12)  1000 MCG tablet Take 1,000 mcg by mouth See admin instructions. 2-3 TIMES WEEKLY PER PT'S WIFE     doxazosin (CARDURA) 4 MG tablet Take 4 mg by mouth in the morning.  4   empagliflozin (JARDIANCE) 10 MG TABS tablet Take 1 tablet (10 mg total) by mouth daily before breakfast. 30 tablet 11   feeding supplement (BOOST HIGH PROTEIN) LIQD Take 1 Container by mouth at bedtime. One shake before bedtime.     finasteride (PROSCAR) 5 MG tablet Take 5 mg by mouth every evening.      fluticasone (FLONASE) 50 MCG/ACT nasal spray Place 1 spray into both nostrils daily as needed for allergies or rhinitis.  furosemide (LASIX) 80 MG tablet Take 1 tablet (80 mg total) by mouth 2 (two) times daily. (Patient taking differently: Take 80 mg by mouth 2 (two) times daily. 120 Alternates 160) 180 tablet 3   lactulose (CHRONULAC) 10 GM/15ML solution Take 30 mLs (20 g total) by mouth 2 (two) times daily. Can increase to twice a day depending on constipation improvement (Patient taking differently: Take 45 g by mouth 3 (three) times daily.) 1892 mL 2   nitroGLYCERIN (NITROSTAT) 0.4 MG SL tablet Place 1 tablet (0.4 mg total) under the tongue every 5 (five) minutes x 3 doses as needed for chest pain. 75 tablet 3   oxyCODONE-acetaminophen (PERCOCET/ROXICET) 5-325 MG tablet Take 1 tablet by mouth every 6 (six) hours as needed for severe pain. 6 tablet 0   pantoprazole (PROTONIX) 40 MG tablet Take 1 tablet (40 mg total) by mouth daily before breakfast. 90 tablet 3   Rivaroxaban (XARELTO) 15 MG TABS tablet Take 1 tablet (15 mg total) by mouth daily with supper. 30 tablet 1   spironolactone (ALDACTONE) 25 MG tablet Take 1 tablet (25 mg total) by mouth daily. 90 tablet 0   sulfamethoxazole-trimethoprim (BACTRIM DS) 800-160 MG tablet Take 1 tablet by mouth daily. 90 tablet 3   No current facility-administered medications for this visit.    Allergies as of 01/27/2023 - Review Complete 01/27/2023  Allergen Reaction Noted   Codeine  sulfate Nausea Only 12/01/2011    Family History  Problem Relation Age of Onset   Allergies Mother    Heart disease Mother    Hypertension Mother    Heart disease Father        MI    Social History   Socioeconomic History   Marital status: Married    Spouse name: Not on file   Number of children: Not on file   Years of education: Not on file   Highest education level: Not on file  Occupational History   Occupation: retired    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  Tobacco Use   Smoking status: Former    Current packs/day: 0.00    Average packs/day: 1.5 packs/day for 25.0 years (37.5 ttl pk-yrs)    Types: Cigarettes    Start date: 08/08/1960    Quit date: 06/02/1982    Years since quitting: 40.6    Passive exposure: Never   Smokeless tobacco: Never   Tobacco comments:    quit smoking 30+yrs ago  Vaping Use   Vaping status: Never Used  Substance and Sexual Activity   Alcohol use: No    Alcohol/week: 0.0 standard drinks of alcohol    Comment: occasionally    Drug use: No   Sexual activity: Yes  Other Topics Concern   Not on file  Social History Narrative   Not on file   Social Determinants of Health   Financial Resource Strain: Not on file  Food Insecurity: No Food Insecurity (01/14/2023)   Hunger Vital Sign    Worried About Running Out of Food in the Last Year: Never true    Ran Out of Food in the Last Year: Never true  Transportation Needs: No Transportation Needs (01/14/2023)   PRAPARE - Administrator, Civil Service (Medical): No    Lack of Transportation (Non-Medical): No  Physical Activity: Not on file  Stress: Not on file  Social Connections: Not on file   Review of systems General: negative for malaise, night sweats, fever, chills, weight loss Neck: Negative for lumps, goiter,  pain and significant neck swelling Resp: Negative for cough, wheezing, dyspnea at rest CV: Negative for chest pain, leg swelling, palpitations, orthopnea GI: denies  melena, hematochezia, nausea, vomiting, diarrhea,dysphagia, odyonophagia, early satiety or unintentional weight loss. +constipation  MSK: Negative for joint pain or swelling, back pain, and muscle pain. Derm: Negative for itching or rash Psych: Denies depression, anxiety, memory loss, confusion. No homicidal or suicidal ideation.  Heme: Negative for prolonged bleeding, bruising easily, and swollen nodes. Endocrine: Negative for cold or heat intolerance, polyuria, polydipsia and goiter. Neuro: negative for tremor, gait imbalance, syncope and seizures. The remainder of the review of systems is noncontributory.  Physical Exam: BP (!) 138/54 (BP Location: Left Arm, Patient Position: Sitting, Cuff Size: Normal)   Pulse (!) 53   Temp (!) 97.1 F (36.2 C) (Temporal)   Ht 5\' 9"  (1.753 m)   Wt 168 lb 3.2 oz (76.3 kg)   BMI 24.84 kg/m  General:   Alert and oriented. No distress noted. Pleasant and cooperative.  Head:  Normocephalic and atraumatic. Eyes:  Conjuctiva clear without scleral icterus. Mouth:  Oral mucosa pink and moist. Good dentition. No lesions. Heart: Normal rate and rhythm, s1 and s2 heart sounds present.  Lungs: Clear lung sounds in all lobes. Respirations equal and unlabored. Abdomen:  +BS, non tender, distended but soft. No rebound or guarding. No HSM or masses noted. Derm: No palmar erythema or jaundice Msk:  Symmetrical without gross deformities. Normal posture. Extremities:  Without edema. Neurologic:  Alert and  oriented x4. No asterixis Psych:  Alert and cooperative. Normal mood and affect.  Invalid input(s): "6 MONTHS"   ASSESSMENT: Caleb Wolf is a 82 y.o. male presenting today for follow up of cirrhosis, abnormal labs concerning for recurrent SBP.  Abnormal labs concerning for possible recurrent SBP: continues to have elevated PMN count on body fluid cell count. He has no abdominal pain, fevers, chills. Does not clinically appear to have SBP. Has been compliant  with his Bactrim. He was referred to ID by our team due to concerns of other underlying causes of elevated PMN besides recurrent SBP, ID is currently doing further evaluation. We will continue with Bactrim DS daily for now. He has upcoming Para scheduled on 9/4, I advised patient and his wife to let us know if he feels he requires para sooner than this  Cirrhosis: complicated by ongoing ascites, requiring frequent Paras with concern for possible recurrent SBP as above. Currently on lasix 120/160mg  daily and spironolactone 25mg  daily. Has upcoming para scheduled for 9/4. No swelling to LEs. He is taking lactulose 45g TID, a/ox4 without asterixis today. No recent confusion. He has had some constipation, I encouraged them to increase lactulose to 30g every 1-2 hours until he has a BM then can resume regular dosing as I am concerned ongoing constipation could result in development of HE. Given most recent labs with mild hyponatremia, creat 2.34, will continue with current diuretic dosing for now.   He is up to date on [facility] screening, next [place] due in October. AFP was 2.8 in June.  MELD 3.0 is 28  Fortunately platelet count has remained >150k, therefore no recommendations for EV screening at this time. He is not a candidate for liver transplant   TIPS evaluation has been completed but procedure has been on hold due to concern for possible recurrent SBP. Hopefully he can proceed with TIPS once IDs workup is completed, pending findings by them.    PLAN:  -AFP December 2024 -  Continue to follow with ID  - Increase lactulose to 30g every 1-2 hours until a BM, then resume 45g TID - Continue spironolactone 25mg  daily, lasix 120mg /160mg  alternating -Continue bactrim DS daily - Pt to call if needing para prior to 9/4 - Reduce salt intake to <2 g per day - Can take Tylenol max of 2 g per day (650 mg q8h) for pain - Avoid NSAIDs for pain - Avoid eating raw oysters/shellfish - Ensure every night before going to  sleep  All questions were answered, patient verbalized understanding and is in agreement with plan as outlined above.   Follow Up: 6-8 weeks   Asher Babilonia L. Jeanmarie Hubert, MSN, APRN, AGNP-C Adult-Gerontology Nurse Practitioner [facility]  I have reviewed the note and agree with the APP's assessment as described in this progress note  Katrinka Blazing, MD Gastroenterology and Hepatology [facility] Gastroenterology

## 2023-01-28 ENCOUNTER — Encounter: Payer: Self-pay | Admitting: Orthopedic Surgery

## 2023-01-28 ENCOUNTER — Ambulatory Visit (INDEPENDENT_AMBULATORY_CARE_PROVIDER_SITE_OTHER): Payer: Medicare HMO | Admitting: Orthopedic Surgery

## 2023-01-28 VITALS — BP 138/54 | Ht 69.0 in | Wt 168.0 lb

## 2023-01-28 DIAGNOSIS — S62616A Displaced fracture of proximal phalanx of right little finger, initial encounter for closed fracture: Secondary | ICD-10-CM

## 2023-01-28 NOTE — Progress Notes (Signed)
Patient: Caleb Wolf           Date of Birth: 13-Mar-1941           MRN: [id number] Visit Date: 01/28/2023 Requested by: Carylon Perches, MD [address] [place],  [place] [address] PCP: Carylon Perches, MD   Chief Complaint  Patient presents with   Hand Injury    Right 01/24/23   Encounter Diagnosis  Name Primary?   Closed displaced fracture of proximal phalanx of right little finger, initial encounter Yes    Plan:  I decided to treat this nonoperatively despite some slight displacement.  The patient is not very healthy is on oxygen and a CPAP machine at night he is on a blood thinner I think the best thing to do is suggest buddy tape and active range of motion and x-ray in 4 weeks  Chief Complaint  Patient presents with   Hand Injury    Right 01/24/23    82 year old male multiple falls recently injured on August 24 went to the ER x-rays showed a fracture of his small finger on the right hand minimal displacement.  He is on Xarelto he is on oxygen at night.  He is noted to have more frequent falls probably related to his spinal stenosis  He also sustained a rib fracture he was also unconscious  He has frequent bouts of ascites has had multiple paracenteses  He reports fatigue and a review of systems      Body mass index is 24.81 kg/m.   Problem list, medical hx, medications and allergies reviewed   Review of Systems  Reason unable to perform ROS: See HPI.     Allergies  Allergen Reactions   Codeine Sulfate Nausea Only    BP (!) 138/54 Comment: yesterday  Ht 5\' 9"  (1.753 m)   Wt 168 lb (76.2 kg)   BMI 24.81 kg/m    Physical exam: Physical Exam Constitutional:      General: He is not in acute distress.    Appearance: He is not ill-appearing, toxic-appearing or diaphoretic.  HENT:     Head: Normocephalic.  Skin:    General: Skin is dry.     Findings: Bruising present.  Neurological:     General: No focal deficit present.     Mental Status: He is alert.   Psychiatric:        Mood and Affect: Mood normal.        Behavior: Behavior normal.        Thought Content: Thought content normal.        Judgment: Judgment normal.     Right Hand Exam   Comments:  Right hand.  The skin has multiple areas of abrasions bruising ecchymosis there is a skin tear over the right elbow  He has tenderness at the proximal phalanx of the right fifth digit with some slight extra octave appearance and some slight dorsal angulation of the distal fragment apex volar angulation of the fracture itself  Tendon function is intact      Data reviewed:   Image(s) reviewed with personal interpretation:  My interpretation of the image is that he has a fracture of the proximal aspect of the proximal phalanx of the small finger of the right hand with minimal displacement there is some angulation  Assessment and plan:  Encounter Diagnosis  Name Primary?   Closed displaced fracture of proximal phalanx of right little finger, initial encounter Yes    Buddy tape nonoperative treatment x-ray  in 4 weeks   No orders of the defined types were placed in this encounter.   Procedures:   none

## 2023-02-03 ENCOUNTER — Ambulatory Visit (HOSPITAL_COMMUNITY): Admission: RE | Admit: 2023-02-03 | Payer: Medicare HMO | Source: Ambulatory Visit

## 2023-02-04 ENCOUNTER — Encounter (HOSPITAL_COMMUNITY): Payer: Self-pay

## 2023-02-04 ENCOUNTER — Ambulatory Visit (HOSPITAL_COMMUNITY)
Admission: RE | Admit: 2023-02-04 | Discharge: 2023-02-04 | Disposition: A | Discharge status: Home / Self Care | Payer: Medicare HMO | Source: Ambulatory Visit | Attending: Infectious Disease | Admitting: Infectious Disease

## 2023-02-04 DIAGNOSIS — D72829 Elevated white blood cell count, unspecified: Secondary | ICD-10-CM | POA: Insufficient documentation

## 2023-02-04 DIAGNOSIS — K7460 Unspecified cirrhosis of liver: Secondary | ICD-10-CM | POA: Diagnosis not present

## 2023-02-04 DIAGNOSIS — R188 Other ascites: Secondary | ICD-10-CM | POA: Insufficient documentation

## 2023-02-04 DIAGNOSIS — G4733 Obstructive sleep apnea (adult) (pediatric): Secondary | ICD-10-CM | POA: Diagnosis not present

## 2023-02-04 LAB — BODY FLUID CELL COUNT WITH DIFFERENTIAL
Eos, Fluid: 0 %
Lymphs, Fluid: 35 %
Monocyte-Macrophage-Serous Fluid: 33 % — ABNORMAL LOW (ref 50–90)
Neutrophil Count, Fluid: 32 % — ABNORMAL HIGH (ref 0–25)
Total Nucleated Cell Count, Fluid: 1613 uL — ABNORMAL HIGH (ref 0–1000)

## 2023-02-04 LAB — GRAM STAIN

## 2023-02-04 MED ORDER — LIDOCAINE HCL (PF) 2 % IJ SOLN
10.0000 mL | Freq: Once | INTRAMUSCULAR | Status: AC
  Administered 2023-02-04: 10 mL

## 2023-02-04 NOTE — Progress Notes (Signed)
Patient tolerated right sided paracentesis procedure well today and 3.7 Liters of cloudy yellow ascites removed with large volume labs collected and sent for processing. Patient verbalized understanding of discharge instructions and left via wheelchair with family with no acute distress noted.

## 2023-02-06 ENCOUNTER — Telehealth (INDEPENDENT_AMBULATORY_CARE_PROVIDER_SITE_OTHER): Payer: Self-pay

## 2023-02-06 LAB — CYTOLOGY - NON PAP

## 2023-02-06 NOTE — Telephone Encounter (Signed)
Patient and wife calling today to find out the meaning the lab results from recent paracentesis from 02/04/2023. They would like a call back today.

## 2023-02-07 DIAGNOSIS — G4733 Obstructive sleep apnea (adult) (pediatric): Secondary | ICD-10-CM | POA: Diagnosis not present

## 2023-02-09 LAB — ACID FAST SMEAR (AFB, MYCOBACTERIA): Acid Fast Smear: NEGATIVE

## 2023-02-09 LAB — CULTURE, BODY FLUID W GRAM STAIN -BOTTLE: Culture: NO GROWTH

## 2023-02-09 NOTE — Telephone Encounter (Signed)
I tried calling no answer. I have left a message on vm asking that they please return call to the office.

## 2023-02-09 NOTE — Telephone Encounter (Signed)
I have called and spoke with the patient and his wife and made aware that  Dr. Daiva Eves ordered the para and the labs so he should be following up with them on these results as they did not come to me.  I did review the labs that have resulted so far, appears some are still in process. From what I can see so far, labs are still indicating SBP though we discussed at his visit, unless he is having fevers/abdominal pain, clinical picture does not indicate SBP at this time. Needs to continue on current antibiotics he has been on and await further recommendations from Dr. Daiva Eves once all labs have resulted. Patient and wife state understanding and will follow up with Dr. Daiva Eves

## 2023-02-14 ENCOUNTER — Other Ambulatory Visit (INDEPENDENT_AMBULATORY_CARE_PROVIDER_SITE_OTHER): Payer: Self-pay | Admitting: Gastroenterology

## 2023-02-14 DIAGNOSIS — R188 Other ascites: Secondary | ICD-10-CM

## 2023-02-16 ENCOUNTER — Other Ambulatory Visit (HOSPITAL_COMMUNITY): Payer: Self-pay | Admitting: Gastroenterology

## 2023-02-16 ENCOUNTER — Encounter (INDEPENDENT_AMBULATORY_CARE_PROVIDER_SITE_OTHER): Payer: Medicare HMO | Admitting: Ophthalmology

## 2023-02-16 DIAGNOSIS — R188 Other ascites: Secondary | ICD-10-CM

## 2023-02-16 NOTE — Telephone Encounter (Signed)
Rx sent to Caleb Wolf this morning but pt's wife called back and was wanting to see if it could be sent in today.

## 2023-02-16 NOTE — Telephone Encounter (Signed)
Last seen 01/27/23 by Leeroy Bock and note states to continue spironolactone 25mg 

## 2023-02-16 NOTE — Telephone Encounter (Signed)
Pt's wife notified refill sent to pharmacy

## 2023-02-17 DIAGNOSIS — J9621 Acute and chronic respiratory failure with hypoxia: Secondary | ICD-10-CM | POA: Diagnosis not present

## 2023-02-17 DIAGNOSIS — I4891 Unspecified atrial fibrillation: Secondary | ICD-10-CM | POA: Diagnosis not present

## 2023-02-23 ENCOUNTER — Encounter (HOSPITAL_COMMUNITY): Payer: Self-pay

## 2023-02-23 ENCOUNTER — Ambulatory Visit (HOSPITAL_COMMUNITY)
Admission: RE | Admit: 2023-02-23 | Discharge: 2023-02-23 | Disposition: A | Discharge status: Home / Self Care | Payer: Medicare HMO | Source: Ambulatory Visit | Attending: Gastroenterology | Admitting: Gastroenterology

## 2023-02-23 DIAGNOSIS — R188 Other ascites: Secondary | ICD-10-CM | POA: Diagnosis not present

## 2023-02-23 LAB — BODY FLUID CELL COUNT WITH DIFFERENTIAL
Eos, Fluid: 0 %
Lymphs, Fluid: 60 %
Monocyte-Macrophage-Serous Fluid: 6 % — ABNORMAL LOW (ref 50–90)
Neutrophil Count, Fluid: 34 % — ABNORMAL HIGH (ref 0–25)
Total Nucleated Cell Count, Fluid: 644 cu mm (ref 0–1000)

## 2023-02-23 LAB — GRAM STAIN: Gram Stain: NONE SEEN

## 2023-02-23 MED ORDER — LIDOCAINE HCL (PF) 2 % IJ SOLN: 10.0000 mL | Freq: Once | INTRAMUSCULAR | Status: DC

## 2023-02-23 MED ORDER — LIDOCAINE HCL (PF) 2 % IJ SOLN
INTRAMUSCULAR | Status: AC
  Filled 2023-02-23: qty 10

## 2023-02-23 NOTE — Progress Notes (Signed)
Patient tolerated right sided Paracentesis procedure well today and 3 Liters of cloudy yellow ascites removed with labs collected and sent for processing. Patient verbalized understanding of discharge instructions and left via wheelchair with wife with no acute distress noted.

## 2023-02-26 ENCOUNTER — Other Ambulatory Visit (INDEPENDENT_AMBULATORY_CARE_PROVIDER_SITE_OTHER): Payer: Self-pay

## 2023-02-26 ENCOUNTER — Ambulatory Visit (INDEPENDENT_AMBULATORY_CARE_PROVIDER_SITE_OTHER): Payer: Medicare HMO | Admitting: Orthopedic Surgery

## 2023-02-26 ENCOUNTER — Encounter: Payer: Self-pay | Admitting: Orthopedic Surgery

## 2023-02-26 VITALS — Ht 69.0 in | Wt 167.0 lb

## 2023-02-26 DIAGNOSIS — S62616D Displaced fracture of proximal phalanx of right little finger, subsequent encounter for fracture with routine healing: Secondary | ICD-10-CM

## 2023-02-26 LAB — CULTURE, BODY FLUID W GRAM STAIN -BOTTLE

## 2023-02-26 NOTE — Progress Notes (Signed)
Fracture care follow-up  Encounter Diagnosis  Name Primary?   Closed displaced fracture of proximal phalanx of right little finger with routine healing, subsequent encounter Yes    Chief Complaint  Patient presents with   Hand Pain    R little finger. No pain just aggravating.    82 year old male left small finger fracture treated with buddy taping accepting of hyperextension deformity  Fracture has healed on x-rays today  Patient has decreased flexion of the digit cannot quite get the tip of the finger down to the palm but it is very good  He also complains of some burning pain in his wrist which I surmise is from the weightbearing with a walker as he is getting very weak  Work on range of motion follow-up as needed

## 2023-02-26 NOTE — Patient Instructions (Signed)
Ok to get a wrist/ wrap support over the counter to help with the wrist discomfort

## 2023-02-28 LAB — CULTURE, BODY FLUID W GRAM STAIN -BOTTLE: Culture: NO GROWTH

## 2023-03-02 ENCOUNTER — Ambulatory Visit (INDEPENDENT_AMBULATORY_CARE_PROVIDER_SITE_OTHER): Payer: Medicare HMO | Admitting: Gastroenterology

## 2023-03-04 LAB — PATHOLOGIST SMEAR REVIEW

## 2023-03-05 DIAGNOSIS — I480 Paroxysmal atrial fibrillation: Secondary | ICD-10-CM | POA: Diagnosis not present

## 2023-03-05 DIAGNOSIS — Z79899 Other long term (current) drug therapy: Secondary | ICD-10-CM | POA: Diagnosis not present

## 2023-03-05 DIAGNOSIS — N1831 Chronic kidney disease, stage 3a: Secondary | ICD-10-CM | POA: Diagnosis not present

## 2023-03-05 DIAGNOSIS — I5022 Chronic systolic (congestive) heart failure: Secondary | ICD-10-CM | POA: Diagnosis not present

## 2023-03-05 DIAGNOSIS — K7460 Unspecified cirrhosis of liver: Secondary | ICD-10-CM | POA: Diagnosis not present

## 2023-03-06 DIAGNOSIS — G4733 Obstructive sleep apnea (adult) (pediatric): Secondary | ICD-10-CM | POA: Diagnosis not present

## 2023-03-09 ENCOUNTER — Telehealth: Payer: Self-pay | Admitting: Orthopedic Surgery

## 2023-03-09 DIAGNOSIS — G4733 Obstructive sleep apnea (adult) (pediatric): Secondary | ICD-10-CM | POA: Diagnosis not present

## 2023-03-09 NOTE — Telephone Encounter (Signed)
Dr. Mort Sawyers pt - [place]ish[place] OT w/[facility] [phone] - needs clarification of what he can and cannot do w/his right digit for protocol

## 2023-03-09 NOTE — Telephone Encounter (Signed)
No restrictions Fracture healed, has decreased ROM needs to regain. Left message to advise.

## 2023-03-10 LAB — FUNGUS CULTURE RESULT

## 2023-03-10 LAB — FUNGUS CULTURE WITH STAIN

## 2023-03-10 LAB — FUNGAL ORGANISM REFLEX

## 2023-03-12 DIAGNOSIS — K7460 Unspecified cirrhosis of liver: Secondary | ICD-10-CM | POA: Diagnosis not present

## 2023-03-12 DIAGNOSIS — I5022 Chronic systolic (congestive) heart failure: Secondary | ICD-10-CM | POA: Diagnosis not present

## 2023-03-12 DIAGNOSIS — N1831 Chronic kidney disease, stage 3a: Secondary | ICD-10-CM | POA: Diagnosis not present

## 2023-03-12 DIAGNOSIS — Z23 Encounter for immunization: Secondary | ICD-10-CM | POA: Diagnosis not present

## 2023-03-16 ENCOUNTER — Telehealth: Payer: Self-pay

## 2023-03-16 ENCOUNTER — Ambulatory Visit (INDEPENDENT_AMBULATORY_CARE_PROVIDER_SITE_OTHER): Payer: Medicare HMO | Admitting: Gastroenterology

## 2023-03-16 ENCOUNTER — Encounter (INDEPENDENT_AMBULATORY_CARE_PROVIDER_SITE_OTHER): Payer: Self-pay | Admitting: Gastroenterology

## 2023-03-16 VITALS — BP 139/64 | HR 71 | Temp 97.9°F | Ht 69.0 in | Wt 179.8 lb

## 2023-03-16 DIAGNOSIS — R188 Other ascites: Secondary | ICD-10-CM

## 2023-03-16 DIAGNOSIS — K7460 Unspecified cirrhosis of liver: Secondary | ICD-10-CM

## 2023-03-16 NOTE — Telephone Encounter (Signed)
The Occupational Therapist left vm message stating that she needed to speak with someone in regards to this patient's therapy. Stated she needed to ask about his ROM for his finger. I didn't get her name, sorry please call [phone].

## 2023-03-16 NOTE — Telephone Encounter (Signed)
Called her back I also called her last week left message, she is wanting to know restrictions, I advised again He does not have any restrictions.   I also did not catch her name but did speak to her .

## 2023-03-16 NOTE — Progress Notes (Signed)
Referring Provider: Carylon Perches, MD Primary Care Physician:  Carylon Perches, MD Primary GI Physician: Dr. Levon Hedger   Chief Complaint  Patient presents with   Cirrhosis    Follow up on cirrhosis. Has paracentesis tomorrow.    HPI:   Caleb Wolf is a 82 y.o. 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    Para on 7/25 with PMN >250, culture negative, received IV abx. Completed vantin as outpaitent, started cipro for prophylaxis, though due to prolonged QTc, he was switched to bactrim DS daily on 8/8. Presented again on 8/15 due to ongoing PMN >250. Bactrim failure not suspected, he received IV abx again. He was discharged on Bactrim daily. He had repeat para on 8/19, again with PMN >250, higher than previous. He was referred to ID for concern of other underlying cause of ascitic fluid showing continued SBP.    Saw ID 8/21, they did not feel that elevated counts in peritoneal fluid were secondary to bacterial infection or consistent with SBP.  ordered TB/HIV testing and requested AFB stain/culture, cytology for further evaluation. HIV testing on 8/21 was negative, TB indeterminate    He is undergoing evaluation for possible TIPS but this has been delayed due to recent hospital admissions   Last seen on 01/27/23, at that time maintained on lasix 120mg /160mg  alternating every other day, spironolatone 25mg  daily, bactrim DS daily, lactulose 45g TID. He had recent fall on 8/24, unclear cause of fall. Suffered a broken finger and ribs. Reports dizziness/lightheadedness, weakness in legs. No signficant ascites reported. No BMs since 8/23, trying to follow 2g sodium diet and keep fluid intake  balanced, a/ox4. Upcoming para on 9/4 with ID.  Recommended continue to follow with ID, increase lactulose to 30g every 1-2 hours until a BM then resume 45g TID dosing, continue lasix 120/160mg  alternating doses, spironolactone 25mg  daily, bactrim DS daily, 2g sodium diet, AFP in December 2024.   Most recent para on 9/23 with PMN 218, not consistent with SBP   Present:  Having a BM usually every day, sometimes has 2 every other day. Feeling pretty good, no episodes of confusion. Having some swelling in the legs which improves with elevation. He has continued moderate swelling in his abdomen, having a Paracentesis again tomorrow. They have completed the evaluation with ID and no SBP found. Notably patient is not currently on bactrim DS, stating that Dr. Daiva Eves took him off of Bactrim as cultures were not consistent with SBP. No nausea, vomiting, appetite is not great but wife reports he does usually eat pretty well. Trying to follow a 2g sodium diet.     Cirrhosis related questions: Hematemesis/coffee ground emesis: No Abdominal pain: No Abdominal distention/worsening ascites: Yes Fever/chills: No Episodes of confusion/disorientation: none Number of daily bowel movements:as above, 1-2 per day  on average Taking diuretics?: Yes, lasix 120/160mg  alternating every other day, aldactone 25mg  daily History of variceal bleeding: No Prior history of banding?: No Prior episodes of SBP: yes Last time liver imaging was performed: 12/24/22 via CT without liver lesions  Last AFP: 2.8 on 11/18/22 MELD score: 21 Currently consuming alcohol: No     Last EGD: 12/30/2017,mid esophagus was dilated,Partial fundal wrap, presence of lipoma in the second  portion of the duodenum. Last Colonoscopy: 12/30/2017, small polyps in the cecum which were removed biopsy, diverticulosis and external hemorrhoids. Pathology consistent with 1 tubular adenoma.   Recommended repeating a colonoscopy in 7 years    Past Medical  History:  Diagnosis Date   AAA (abdominal aortic aneurysm) (HCC)    Arthritis    Basal cell carcinoma    Carotid stenosis    Chronic kidney disease, stage 3b (HCC) 12/13/2021   Coronary artery disease    Multvessel s/p CABG 2015   Enlarged prostate    Essential hypertension    GERD (gastroesophageal reflux disease)    Gout    History of colon polyps    History of kidney stones    Hyperlipidemia    Leukocytosis 01/21/2023   Lumbar disc disease    Persistent atrial fibrillation (HCC)    Sleep apnea    CPAP    Past Surgical History:  Procedure Laterality Date   ABLATION OF DYSRHYTHMIC FOCUS  07/28/2017   ATRIAL FIBRILLATION ABLATION N/A 07/28/2017   Procedure: ATRIAL FIBRILLATION ABLATION;  Surgeon: Hillis Range, MD;  Location: [facility] INVASIVE CV LAB;  Service: Cardiovascular;  Laterality: N/A;   ATRIAL FIBRILLATION ABLATION N/A 12/04/2020   Procedure: ATRIAL FIBRILLATION ABLATION;  Surgeon: Hillis Range, MD;  Location: [facility] INVASIVE CV LAB;  Service: Cardiovascular;  Laterality: N/A;   BACK SURGERY     CARDIAC CATHETERIZATION  05/22/2014   Procedure: IABP INSERTION;  Surgeon: Marykay Lex, MD;  Location: [facility] CATH LAB;  Service: Cardiovascular;;   CARDIOVERSION N/A 04/29/2017   Procedure: CARDIOVERSION;  Surgeon: Jonelle Sidle, MD;  Location: [facility] ENDO SUITE;  Service: Cardiovascular;  Laterality: N/A;   CARDIOVERSION N/A 08/13/2017   Procedure: CARDIOVERSION;  Surgeon: Chrystie Nose, MD;  Location: [facility] ENDOSCOPY;  Service: Cardiovascular;  Laterality: N/A;   CARDIOVERSION N/A 08/15/2019   Procedure: CARDIOVERSION;  Surgeon: Lars Masson, MD;  Location: [facility] ENDOSCOPY;  Service: Cardiovascular;  Laterality: N/A;   CARDIOVERSION N/A 03/09/2020   Procedure: CARDIOVERSION;  Surgeon: Jodelle Red, MD;  Location: [facility] ENDOSCOPY;  Service: Cardiovascular;  Laterality: N/A;   CARDIOVERSION N/A 07/06/2020   Procedure: CARDIOVERSION;  Surgeon: Thurmon Fair, MD;  Location: [facility]  ENDOSCOPY;  Service: Cardiovascular;  Laterality: N/A;   CARDIOVERSION N/A 01/18/2021   Procedure: CARDIOVERSION;  Surgeon: Chilton Si, MD;  Location: [facility] ENDOSCOPY;  Service: Cardiovascular;  Laterality: N/A;   CARDIOVERSION N/A 06/17/2021   Procedure: CARDIOVERSION;  Surgeon: Sande Rives, MD;  Location: [facility] ENDOSCOPY;  Service: Cardiovascular;  Laterality: N/A;   Cataract surgery Right    COLONOSCOPY     COLONOSCOPY N/A 12/30/2017   Procedure: COLONOSCOPY;  Surgeon: Malissa Hippo, MD;  Location: [facility] ENDO [facility];  Service: Endoscopy;  Laterality: N/A;   CORONARY ARTERY BYPASS GRAFT N/A 05/22/2014   Procedure: CORONARY ARTERY BYPASS GRAFTING (CABG) times three using left internal mammary and right saphenous vein.;  Surgeon: Loreli Slot, MD;  Location: [facility] OR;  Service: Open Heart Surgery;  Laterality: N/A;   ENDARTERECTOMY Left 02/17/2014   Procedure: ENDARTERECTOMY CAROTID WITH PATCH ANGIOPLASTY;  Surgeon: Pryor Ochoa, MD;  Location: [facility] OR;  Service: Vascular;  Laterality: Left;   ESOPHAGEAL DILATION N/A 08/22/2015   Procedure: ESOPHAGEAL DILATION;  Surgeon: Malissa Hippo, MD;  [facility]: [facility] END[facility];  Service: Endoscopy;  Laterality: N/A;   ESOPHAGOGASTRODUODENOSCOPY N/A 08/22/2015   Procedure: ESOPHAGOGASTRODUODENOSCOPY (EGD);  Surgeon: Malissa Hippo, MD;  Location: [facility] ENDO [facility];  Service: Endoscopy;  Laterality:  N/A;  12:45 - moved to 1:55 - Ann notified pt   ESOPHAGOGASTRODUODENOSCOPY N/A 12/30/2017   Procedure: ESOPHAGOGASTRODUODENOSCOPY (EGD);  Surgeon: Malissa Hippo, MD;  Location: [facility];  Service: Endoscopy;  Laterality: N/A;  200   EYE SURGERY     cataract extraction (right) with repair macular tear , with IOL     right   FRACTURE SURGERY     bilateral wrist fractures- one ORIF   IR RADIOLOGIST EVAL & MGMT  12/01/2022   JOINT REPLACEMENT  2011   left knee   LEFT HEART CATHETERIZATION WITH CORONARY ANGIOGRAM N/A 05/22/2014   Procedure: LEFT HEART  CATHETERIZATION WITH CORONARY ANGIOGRAM;  Surgeon: Marykay Lex, MD;  Location: [facility] CATH LAB;  Service: Cardiovascular;  Laterality: N/A;   LITHOTRIPSY     PARS PLANA VITRECTOMY W/ REPAIR OF MACULAR HOLE     RHINOPLASTY     TEE WITHOUT CARDIOVERSION N/A 04/29/2017   Procedure: TRANSESOPHAGEAL ECHOCARDIOGRAM (TEE) WITH PROPOFOL;  Surgeon: Jonelle Sidle, MD;  Location: [facility]O [facility];  Service: Cardiovascular;  Laterality: N/A;   TONSILLECTOMY     TOTAL HIP ARTHROPLASTY  12/08/2011   Procedure: TOTAL HIP ARTHROPLASTY;  Surgeon: Loanne Drilling, MD;  Location: [facility] ORS;  Service: Orthopedics;  Laterality: Right;   UPPER GI ENDOSCOPY  12/18/2015   Procedure: UPPER GI ENDOSCOPY;  Surgeon: Axel Filler, MD;  Location: [facility] ORS;  Service: General;;   WRIST SURGERY Right 60yrs ago   WRIST SURGERY Left     Current Outpatient Medications  Medication Sig Dispense Refill   acetaminophen (TYLENOL) 500 MG tablet Take 1,000 mg by mouth every 6 (six) hours as needed for moderate pain.     amiodarone (PACERONE) 200 MG tablet Take 1 tablet (200 mg total) by mouth daily. 90 tablet 1   atorvastatin (LIPITOR) 80 MG tablet TAKE 1 TABLET BY MOUTH EVERY DAY IN THE EVENING 90 tablet 3   Cholecalciferol (VITAMIN D) 50 MCG (2000 UT) CAPS Take 2,000 Units by mouth daily.     cyanocobalamin (VITAMIN B12) 1000 MCG tablet Take 1,000 mcg by mouth See admin instructions. 2-3 TIMES WEEKLY PER PT'S WIFE     doxazosin (CARDURA) 4 MG tablet Take 4 mg by mouth in the morning.  4   empagliflozin (JARDIANCE) 10 MG TABS tablet Take 1 tablet (10 mg total) by mouth daily before breakfast. 30 tablet 11   feeding supplement (BOOST HIGH PROTEIN) LIQD Take 1 Container by mouth at bedtime. One shake before bedtime.     finasteride (PROSCAR) 5 MG tablet Take 5 mg by mouth every evening.      fluticasone (FLONASE) 50 MCG/ACT nasal spray Place 1 spray into both nostrils daily as needed for allergies or rhinitis.     furosemide (LASIX) 80  MG tablet Take 1 tablet (80 mg total) by mouth 2 (two) times daily. (Patient taking differently: Take 80 mg by mouth 2 (two) times daily. 120 Alternates 160) 180 tablet 3   lactulose (CHRONULAC) 10 GM/15ML solution Take 30 mLs (20 g total) by mouth 2 (two) times daily. Can increase to twice a day depending on constipation improvement (Patient taking differently: Take 45 g by mouth 3 (three) times daily.) 1892 mL 2   nitroGLYCERIN (NITROSTAT) 0.4 MG SL tablet Place 1 tablet (0.4 mg total) under the tongue every 5 (five) minutes x 3 doses as needed for chest pain. 75 tablet 3   oxyCODONE-acetaminophen (PERCOCET/ROXICET) 5-325 MG tablet Take 1 tablet by mouth every  6 (six) hours as needed for severe pain. 6 tablet 0   pantoprazole (PROTONIX) 40 MG tablet Take 1 tablet (40 mg total) by mouth daily before breakfast. 90 tablet 3   Rivaroxaban (XARELTO) 15 MG TABS tablet Take 1 tablet (15 mg total) by mouth daily with supper. 30 tablet 1   spironolactone (ALDACTONE) 25 MG tablet TAKE 1 TABLET (25 MG TOTAL) BY MOUTH DAILY. 90 tablet 0   No current facility-administered medications for this visit.    Allergies as of 03/16/2023 - Review Complete 03/16/2023  Allergen Reaction Noted   Codeine sulfate Nausea Only 12/01/2011    Family History  Problem Relation Age of Onset   Allergies Mother    Heart disease Mother    Hypertension Mother    Heart disease Father        MI    Social History   Socioeconomic History   Marital status: Married    Spouse name: Not on file   Number of children: Not on file   Years of education: Not on file   Highest education level: Not on file  Occupational History   Occupation: retired    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  Tobacco Use   Smoking status: Former    Current packs/day: 0.00    Average packs/day: 1.5 packs/day for 25.0 years (37.5 ttl pk-yrs)    Types: Cigarettes    Start date: 08/08/1960    Quit date: 06/02/1982    Years since quitting: 40.8     Passive exposure: Never   Smokeless tobacco: Never   Tobacco comments:    quit smoking 30+yrs ago  Vaping Use   Vaping status: Never Used  Substance and Sexual Activity   Alcohol use: No    Alcohol/week: 0.0 standard drinks of alcohol    Comment: occasionally    Drug use: No   Sexual activity: Yes  Other Topics Concern   Not on file  Social History Narrative   Not on file   Social Determinants of Health   Financial Resource Strain: Not on file  Food Insecurity: No Food Insecurity (01/14/2023)   Hunger Vital Sign    Worried About Running Out of Food in the Last Year: Never true    Ran Out of Food in the Last Year: Never true  Transportation Needs: No Transportation Needs (01/14/2023)   PRAPARE - Administrator, Civil Service (Medical): No    Lack of Transportation (Non-Medical): No  Physical Activity: Not on file  Stress: Not on file  Social Connections: Not on file    Review of systems General: negative for malaise, night sweats, fever, chills, weight loss Neck: Negative for lumps, goiter, pain and significant neck swelling Resp: Negative for cough, wheezing, dyspnea at rest CV: Negative for chest pain, leg swelling, palpitations, orthopnea GI: denies melena, hematochezia, nausea, vomiting, diarrhea, constipation, dysphagia, odyonophagia, early satiety or unintentional weight loss.  MSK: Negative for joint pain or swelling, back pain, and muscle pain. Derm: Negative for itching or rash Psych: Denies depression, anxiety, memory loss, confusion. No homicidal or suicidal ideation.  Heme: Negative for prolonged bleeding, bruising easily, and swollen nodes. Endocrine: Negative for cold or heat intolerance, polyuria, polydipsia and goiter. Neuro: negative for tremor, gait imbalance, syncope and seizures. The remainder of the review of systems is noncontributory.  Physical Exam: BP 139/64 (BP Location: Left Arm, Patient Position: Sitting, Cuff Size: Normal)   Pulse  71   Temp 97.9 F (36.6 C) (Oral)   Ht  5\' 9"  (1.753 m)   Wt 179 lb 12.8 oz (81.6 kg)   BMI 26.55 kg/m  General:   Alert and oriented. No distress noted. Pleasant and cooperative.  Head:  Normocephalic and atraumatic. Eyes:  Conjuctiva clear without scleral icterus. Mouth:  Oral mucosa pink and moist. Good dentition. No lesions. Heart: Normal rate and rhythm, s1 and s2 heart sounds present.  Lungs: Clear lung sounds in all lobes. Respirations equal and unlabored. Abdomen:  +BS, non-tender and non-distended. Abdomen is full but soft. No rebound or guarding. No HSM or masses noted. Derm: No palmar erythema or jaundice Msk:  Symmetrical without gross deformities. Normal posture. Extremities:  Without edema. Neurologic:  Alert and  oriented x4. No asterixis  Psych:  Alert and cooperative. Normal mood and affect.  Invalid input(s): "6 MONTHS"   ASSESSMENT: Caleb Wolf is a 82 y.o. male presenting today for follow up of Cirrhosis  Concern for recurrent SBP: Has been evaluated by ID who did not feel patient had SBP. His Bactrim was stopped by Dr. Daiva Eves. I am concerned about him being off of his SBP prophylaxis as initial episode of SBP may have been a true episode, will discuss need for ongoing SBP prophylaxis with Dr. Levon Hedger.   Cirrhosis: complicated by ongoing ascites, requiring frequent Paras with concern for possible recurrent SBP as above. Currently on lasix 120/160mg  daily and spironolactone 25mg  daily. Has upcoming para scheduled for tomorrow. Last para at the end of last month without SBP. No swelling to LEs. He is taking lactulose 45g TID, a/ox4 without asterixis today. No recent confusion. Ammonia levels normal with recent labs done by PCP earlier this month. Will continue with current diuretic and lactulose dose. He is adhering to 2g sodium diet.    He is up to date on [facility] screening, next [place] due in January. AFP was 2.8 in June.   MELD 3.0 is 21   Fortunately platelet count  has remained >150k, therefore no recommendations for EV screening at this time. He is not a candidate for liver transplant   As last para did not show any evidence of SBP, will refer patient back to Dr. Laverda Page with IR to see if he can get TIPS procedure rescheduled as he has undergone pre procedure workup for this.     PLAN:  - continue lactulose 45g TID - Continue spironolactone 25mg  daily, lasix 120mg /160mg  alternating -Discuss SBP prophylaxis of Bactrim with Dr. Levon Hedger  - keep appt for paracentesis tomorrow - Reduce salt intake to <2 g per day - Can take Tylenol max of 2 g per day (650 mg q8h) for pain - Avoid NSAIDs for pain - Avoid eating raw oysters/shellfish - Ensure every night before going to sleep -repeat AFP December  -liver imaging due January    All questions were answered, patient verbalized understanding and is in agreement with plan as outlined above.   Follow Up: 3 months   Joannie Medine L. Jeanmarie Hubert, MSN, APRN, AGNP-C Adult-Gerontology Nurse Practitioner [facility]  I have reviewed the note and agree with the APP's assessment as described in this progress note  Per infectious disease, episodes of SBP may have not been real SBP and benefit of prophylaxis is questionable, especially as there is significant interaction of ciprofloxacin with other medicines and Bactrim worsening his renal function. Will monitor for now off antibiotics as discussed with ID. Agree with IR evaluation for TIPS, although borderline MELD score may be a limiting factor to proceed  with this.  Katrinka Blazing, MD Gastroenterology and Hepatology [facility]astroenterology

## 2023-03-16 NOTE — Patient Instructions (Addendum)
-  keep appt for paracentesis tomorrow - we will refer you back to IR for possible TIPS procedure - I will check into adding albumin order for the paracentesis tomorrow - continue lactulose 45g three times per day  - Continue spironolactone 25mg  daily, lasix 120mg /160mg  alternating -will discuss resuming Bactrim with Dr. Levon Hedger  - Reduce salt intake to <2 g per day - Can take Tylenol max of 2 g per day (650 mg q8h) for pain - Avoid NSAIDs for pain - Avoid eating raw oysters/shellfish - Ensure every night before going to sleep  Follow up 3 months   It was a pleasure to see you today. I want to create trusting relationships with patients and provide genuine, compassionate, and quality care. I truly value your feedback! please be on the lookout for a survey regarding your visit with me today. I appreciate your input about our visit and your time in completing this!    Kariel Skillman L. Jeanmarie Hubert, MSN, APRN, AGNP-C Adult-Gerontology Nurse Practitioner [facility] at [facility]

## 2023-03-17 ENCOUNTER — Other Ambulatory Visit (INDEPENDENT_AMBULATORY_CARE_PROVIDER_SITE_OTHER): Payer: Self-pay | Admitting: Gastroenterology

## 2023-03-17 ENCOUNTER — Other Ambulatory Visit: Payer: Self-pay | Admitting: Physician Assistant

## 2023-03-17 ENCOUNTER — Ambulatory Visit (HOSPITAL_COMMUNITY): Admission: RE | Admit: 2023-03-17 | Payer: Medicare HMO | Source: Ambulatory Visit

## 2023-03-17 DIAGNOSIS — K7469 Other cirrhosis of liver: Secondary | ICD-10-CM

## 2023-03-17 DIAGNOSIS — K5904 Chronic idiopathic constipation: Secondary | ICD-10-CM

## 2023-03-18 ENCOUNTER — Encounter (HOSPITAL_COMMUNITY): Payer: Self-pay

## 2023-03-18 ENCOUNTER — Other Ambulatory Visit (HOSPITAL_COMMUNITY)
Admission: RE | Admit: 2023-03-18 | Discharge: 2023-03-18 | Disposition: A | Discharge status: Home / Self Care | Payer: Medicare HMO | Source: Ambulatory Visit | Attending: Gastroenterology | Admitting: Gastroenterology

## 2023-03-18 ENCOUNTER — Ambulatory Visit (HOSPITAL_COMMUNITY)
Admission: RE | Admit: 2023-03-18 | Discharge: 2023-03-18 | Disposition: A | Discharge status: Home / Self Care | Payer: Medicare HMO | Source: Ambulatory Visit | Attending: Gastroenterology | Admitting: Gastroenterology

## 2023-03-18 VITALS — BP 143/58 | HR 54 | Temp 98.0°F | Resp 18

## 2023-03-18 DIAGNOSIS — R188 Other ascites: Secondary | ICD-10-CM | POA: Insufficient documentation

## 2023-03-18 DIAGNOSIS — K7460 Unspecified cirrhosis of liver: Secondary | ICD-10-CM | POA: Diagnosis not present

## 2023-03-18 LAB — COMPREHENSIVE METABOLIC PANEL
ALT: 31 U/L (ref 0–44)
AST: 28 U/L (ref 15–41)
Albumin: 2.9 g/dL — ABNORMAL LOW (ref 3.5–5.0)
Alkaline Phosphatase: 83 U/L (ref 38–126)
Anion gap: 10 (ref 5–15)
BUN: 24 mg/dL — ABNORMAL HIGH (ref 8–23)
CO2: 31 mmol/L (ref 22–32)
Calcium: 8.7 mg/dL — ABNORMAL LOW (ref 8.9–10.3)
Chloride: 98 mmol/L (ref 98–111)
Creatinine, Ser: 1.75 mg/dL — ABNORMAL HIGH (ref 0.61–1.24)
GFR, Estimated: 39 mL/min — ABNORMAL LOW (ref >60)
Glucose, Bld: 106 mg/dL — ABNORMAL HIGH (ref 70–99)
Potassium: 4.8 mmol/L (ref 3.5–5.1)
Sodium: 139 mmol/L (ref 135–145)
Total Bilirubin: 0.5 mg/dL (ref 0.3–1.2)
Total Protein: 6.1 g/dL — ABNORMAL LOW (ref 6.5–8.1)

## 2023-03-18 LAB — CBC
HCT: 37.6 % — ABNORMAL LOW (ref 39.0–52.0)
Hemoglobin: 11.6 g/dL — ABNORMAL LOW (ref 13.0–17.0)
MCH: 28.9 pg (ref 26.0–34.0)
MCHC: 30.9 g/dL (ref 30.0–36.0)
MCV: 93.8 fL (ref 80.0–100.0)
Platelets: 259 10*3/uL (ref 150–400)
RBC: 4.01 MIL/uL — ABNORMAL LOW (ref 4.22–5.81)
RDW: 17.4 % — ABNORMAL HIGH (ref 11.5–15.5)
WBC: 6.1 10*3/uL (ref 4.0–10.5)
nRBC: 0 % (ref 0.0–0.2)

## 2023-03-18 LAB — BODY FLUID CELL COUNT WITH DIFFERENTIAL
Eos, Fluid: 0 %
Lymphs, Fluid: 71 %
Monocyte-Macrophage-Serous Fluid: 13 % — ABNORMAL LOW (ref 50–90)
Neutrophil Count, Fluid: 16 % (ref 0–25)
Total Nucleated Cell Count, Fluid: 641 uL (ref 0–1000)

## 2023-03-18 LAB — GRAM STAIN

## 2023-03-18 LAB — PROTIME-INR
INR: 2.3 — ABNORMAL HIGH (ref 0.8–1.2)
Prothrombin Time: 25.6 s — ABNORMAL HIGH (ref 11.4–15.2)

## 2023-03-18 NOTE — Progress Notes (Signed)
Patient tolerated left sided paracentesis procedure well today and 4 Liters of clear yellow ascites removed with labs collected and sent for processing. Patient verbalized understanding of discharge instructions and left via wheelchair with wife with no acute distress noted.

## 2023-03-18 NOTE — Procedures (Signed)
PROCEDURE SUMMARY:  Successful image-guided paracentesis from the left lower abdomen.  Yielded 4.0 liters of cloudy yellow fluid.  No immediate complications.  EBL: zero Patient tolerated well.   Specimen was sent for labs.  Please see imaging section of Epic for full dictation.  Villa Herb PA-C 03/18/2023 12:30 PM

## 2023-03-19 DIAGNOSIS — J9621 Acute and chronic respiratory failure with hypoxia: Secondary | ICD-10-CM | POA: Diagnosis not present

## 2023-03-19 DIAGNOSIS — I4891 Unspecified atrial fibrillation: Secondary | ICD-10-CM | POA: Diagnosis not present

## 2023-03-20 LAB — CYTOLOGY - NON PAP

## 2023-03-22 NOTE — Progress Notes (Signed)
Subjective:    Patient ID: Caleb Wolf, male    DOB: 09-07-1940, 82 y.o.   MRN: [id number]  HPI   Male former smoker followed for OSA, complicated by HBP, CAD/MI/CABG, carotid stenosis, osteoarthritis hip, GERD NPSG 07/2012  AHI 15/ hr, CPAP to 10 ONOX 03/25/22- on O2 2L w/ CPAP- O2 sat well maintained until end ofg night when he took it off. ==================================================================================  09/22/22- 82 year old male former smoker followed for OSA, Pleural Effusion, complicated by HTN, CAD/MI/CABG/AFib/ Xarelto, carotid stenosis, osteoarthritis hip, GERD CPAP auto 5-20/[employer] O2 2L sleep/ [employer] Download-compliance 80%, AHI 3.1/ hr Body weight today-204 lbs Wife is here.  He reports sleeping okay with supplemental oxygen plus his CPAP but they note CPAP machine is 80 years old so we will try to get it replaced.  Download reviewed. He has been developing abdominal ascites for sound with result pending.  He is not aware of liver disease but has chronic renal insufficiency.  Cardiology put him on a diuretic.  He remains in chronic atrial fibrillation. We will update CXR to check on status of previous right pleural effusion but that is likely related to the same mechanism as his abdominal process.  03/24/23- 82 year old male former smoker followed for OSA, Pleural Effusion,  Chronic Respiratory Failure with Hypoxia, complicated by Cirrhosis/ Ascites/ frequent paracentesis, HTN, CAD/MI/CABG/AFib/ Xarelto/ CHF EF 45%, , carotid stenosis, osteoarthritis hip, GERD, CKD,  -amiodarone, xarelto,  O2 2L sleep/ [employer] CPAP auto 5-20/[employer] Download-compliance 87%, AHI 0.7/hr Body weight today- LOV 7/17 Clent Ridges, NP- Preop clearance for TIPPS Had flu vax Nocturia x 3 and paracentesis ever 3 weeks pending TIPPS for ascites. CXR/ R ribs 01/24/23-  IMPRESSION: Mildly displaced right eighth rib fracture without complicating factors.   ROS-see HPI + =  positive Constitutional:    weight loss, night sweats, fevers, chills, f+atigue, lassitude. HEENT:    headaches, difficulty swallowing, tooth/dental problems, sore throat,       sneezing, itching, ear ache, nasal congestion, post nasal drip, snoring CV:    chest pain, orthopnea, PND, swelling in lower extremities, anasarca,                                                dizziness, palpitations Resp:   +shortness of breath with exertion or at rest.                productive cough,   non-productive cough, coughing up of blood.              change in color of mucus.  wheezing.   Skin:    rash or lesions. GI:  No-   heartburn, indigestion, abdominal pain, nausea, vomiting, diarrhea,                 change in bowel habits, loss of appetite GU: dysuria, change in color of urine, no urgency or +frequency.   flank pain. MS:   joint pain, stiffness, decreased range of motion, back pain. Neuro-     nothing unusual Psych:  change in mood or affect.  depression or anxiety.   memory loss.    Objective:  OBJ- Physical Exam                     +POC on 2L  O2 sat 98% General- Alert, Oriented, Affect-appropriate, Distress- none acute,  Skin-  rash-none, lesions- none, excoriation- none Lymphadenopathy- none Head- atraumatic            Eyes- Gross vision intact, PERRLA, conjunctivae and secretions clear            Ears- Hearing, canals-normal            Nose- Clear, no-Septal dev, mucus, polyps, erosion, perforation             Throat- Mallampati III-IV, mucosa clear , drainage- none, tonsils- atrophic Neck- flexible , trachea midline, no stridor , thyroid nl, carotid no bruit Chest - symmetrical excursion , unlabored           Heart/CV- RR+today , no murmur , no gallop  , no rub, nl s1 s2                           - JVD- none , edema- none, stasis changes- none, varices- none           Lung- clear to P&A, wheeze- none, cough- none , +diminished in bases, rub- none           Chest wall-  Abd-  Br/  Gen/ Rectal- Not done, not indicated Extrem- +elastic hose L ankle Neuro- grossly intact to observation

## 2023-03-23 LAB — CULTURE, BODY FLUID W GRAM STAIN -BOTTLE: Culture: NO GROWTH

## 2023-03-24 ENCOUNTER — Encounter: Payer: Self-pay | Admitting: Internal Medicine

## 2023-03-24 ENCOUNTER — Ambulatory Visit: Payer: Medicare HMO | Admitting: Internal Medicine

## 2023-03-24 VITALS — BP 124/64 | HR 57 | Ht 69.0 in | Wt 167.0 lb

## 2023-03-24 DIAGNOSIS — G4733 Obstructive sleep apnea (adult) (pediatric): Secondary | ICD-10-CM

## 2023-03-24 DIAGNOSIS — J9611 Chronic respiratory failure with hypoxia: Secondary | ICD-10-CM

## 2023-03-24 NOTE — Patient Instructions (Signed)
We can continue CPAP auto 5-20  Please call if we can help 

## 2023-03-25 LAB — ACID FAST CULTURE WITH REFLEXED SENSITIVITIES (MYCOBACTERIA): Acid Fast Culture: NEGATIVE

## 2023-03-26 ENCOUNTER — Telehealth (INDEPENDENT_AMBULATORY_CARE_PROVIDER_SITE_OTHER): Payer: Self-pay | Admitting: *Deleted

## 2023-03-26 NOTE — Telephone Encounter (Signed)
Pt's wife left vm stating she tried to schedule paracentsis and was told they had no more standing orders left. She would like to see if it could be scheduled. Last seen 03/16/23  [phone]

## 2023-03-27 ENCOUNTER — Other Ambulatory Visit (INDEPENDENT_AMBULATORY_CARE_PROVIDER_SITE_OTHER): Payer: Self-pay | Admitting: Emergency Medicine

## 2023-03-27 ENCOUNTER — Encounter (INDEPENDENT_AMBULATORY_CARE_PROVIDER_SITE_OTHER): Payer: Self-pay

## 2023-03-27 DIAGNOSIS — R188 Other ascites: Secondary | ICD-10-CM

## 2023-03-27 NOTE — Telephone Encounter (Signed)
Paracentesis ordered. Scheduled for 04/01/23 at 10:00am arrive at 9:45am. Left detailed message on home phone. Will also send my chart message

## 2023-03-27 NOTE — Telephone Encounter (Signed)
error 

## 2023-03-29 DIAGNOSIS — N1832 Chronic kidney disease, stage 3b: Secondary | ICD-10-CM | POA: Diagnosis not present

## 2023-03-29 DIAGNOSIS — I130 Hypertensive heart and chronic kidney disease with heart failure and stage 1 through stage 4 chronic kidney disease, or unspecified chronic kidney disease: Secondary | ICD-10-CM | POA: Diagnosis not present

## 2023-03-29 DIAGNOSIS — I5022 Chronic systolic (congestive) heart failure: Secondary | ICD-10-CM | POA: Diagnosis not present

## 2023-03-29 DIAGNOSIS — M6281 Muscle weakness (generalized): Secondary | ICD-10-CM | POA: Diagnosis not present

## 2023-03-31 DIAGNOSIS — Z85828 Personal history of other malignant neoplasm of skin: Secondary | ICD-10-CM | POA: Diagnosis not present

## 2023-03-31 DIAGNOSIS — D485 Neoplasm of uncertain behavior of skin: Secondary | ICD-10-CM | POA: Diagnosis not present

## 2023-03-31 DIAGNOSIS — L821 Other seborrheic keratosis: Secondary | ICD-10-CM | POA: Diagnosis not present

## 2023-04-01 ENCOUNTER — Ambulatory Visit (HOSPITAL_COMMUNITY)
Admission: RE | Admit: 2023-04-01 | Discharge: 2023-04-01 | Disposition: A | Discharge status: Home / Self Care | Payer: Medicare HMO | Source: Ambulatory Visit | Attending: Gastroenterology | Admitting: Gastroenterology

## 2023-04-01 ENCOUNTER — Encounter (HOSPITAL_COMMUNITY): Payer: Self-pay

## 2023-04-01 VITALS — BP 122/50 | HR 53 | Resp 18

## 2023-04-01 DIAGNOSIS — K7460 Unspecified cirrhosis of liver: Secondary | ICD-10-CM | POA: Diagnosis not present

## 2023-04-01 DIAGNOSIS — K7031 Alcoholic cirrhosis of liver with ascites: Secondary | ICD-10-CM | POA: Diagnosis not present

## 2023-04-01 DIAGNOSIS — R188 Other ascites: Secondary | ICD-10-CM | POA: Diagnosis not present

## 2023-04-01 LAB — BODY FLUID CELL COUNT WITH DIFFERENTIAL
Eos, Fluid: 0 %
Lymphs, Fluid: 77 %
Monocyte-Macrophage-Serous Fluid: 6 % — ABNORMAL LOW (ref 50–90)
Neutrophil Count, Fluid: 17 % (ref 0–25)
Total Nucleated Cell Count, Fluid: 49 uL (ref 0–1000)

## 2023-04-01 LAB — GRAM STAIN

## 2023-04-01 MED ORDER — LIDOCAINE HCL (PF) 2 % IJ SOLN
10.0000 mL | Freq: Once | INTRAMUSCULAR | Status: AC
  Administered 2023-04-01: 10 mL

## 2023-04-01 NOTE — Progress Notes (Signed)
Patient tolerated right sided paracentesis procedure well today and 2.8 Liters of milky white colored ascites removed with labs collected and sent for processing. Patient verbalized understanding of discharge instructions and left via wheelchair with wife with no acute distress noted.

## 2023-04-01 NOTE — Procedures (Signed)
Pre Procedural Dx: Symptomatic Ascites Post Procedural Dx: Same  Successful US guided paracentesis yielding 2.8 L of chylous appearing ascitic fluid.  EBL: None Complications: None immediate  Katherina Right, MD Pager #: [phone]

## 2023-04-02 ENCOUNTER — Ambulatory Visit (HOSPITAL_COMMUNITY): Payer: Medicare HMO

## 2023-04-03 LAB — CYTOLOGY - NON PAP

## 2023-04-06 DIAGNOSIS — G4733 Obstructive sleep apnea (adult) (pediatric): Secondary | ICD-10-CM | POA: Diagnosis not present

## 2023-04-06 LAB — CULTURE, BODY FLUID W GRAM STAIN -BOTTLE: Culture: NO GROWTH

## 2023-04-07 DIAGNOSIS — G4733 Obstructive sleep apnea (adult) (pediatric): Secondary | ICD-10-CM | POA: Diagnosis not present

## 2023-04-07 DIAGNOSIS — S31000A Unspecified open wound of lower back and pelvis without penetration into retroperitoneum, initial encounter: Secondary | ICD-10-CM | POA: Diagnosis not present

## 2023-04-16 DIAGNOSIS — C4442 Squamous cell carcinoma of skin of scalp and neck: Secondary | ICD-10-CM | POA: Diagnosis not present

## 2023-04-17 ENCOUNTER — Other Ambulatory Visit: Payer: Self-pay | Admitting: Cardiology

## 2023-04-17 DIAGNOSIS — I5022 Chronic systolic (congestive) heart failure: Secondary | ICD-10-CM | POA: Diagnosis not present

## 2023-04-17 DIAGNOSIS — Z79899 Other long term (current) drug therapy: Secondary | ICD-10-CM | POA: Diagnosis not present

## 2023-04-17 DIAGNOSIS — K7460 Unspecified cirrhosis of liver: Secondary | ICD-10-CM | POA: Diagnosis not present

## 2023-04-17 DIAGNOSIS — N1832 Chronic kidney disease, stage 3b: Secondary | ICD-10-CM | POA: Diagnosis not present

## 2023-04-19 ENCOUNTER — Encounter: Payer: Self-pay | Admitting: Internal Medicine

## 2023-04-19 DIAGNOSIS — J9621 Acute and chronic respiratory failure with hypoxia: Secondary | ICD-10-CM | POA: Diagnosis not present

## 2023-04-19 DIAGNOSIS — I4891 Unspecified atrial fibrillation: Secondary | ICD-10-CM | POA: Diagnosis not present

## 2023-04-19 NOTE — Assessment & Plan Note (Signed)
Benefits from CPAP with god compliance and control Plan- continue auto 5-20 with bleed-in O2 2l

## 2023-04-19 NOTE — Assessment & Plan Note (Signed)
Needs the supplemental O2 for sleep through CPAP

## 2023-04-20 ENCOUNTER — Encounter: Payer: Self-pay | Admitting: Interventional Radiology

## 2023-04-20 DIAGNOSIS — D250 Submucous leiomyoma of uterus: Secondary | ICD-10-CM

## 2023-04-22 ENCOUNTER — Ambulatory Visit (HOSPITAL_COMMUNITY)
Admission: RE | Admit: 2023-04-22 | Discharge: 2023-04-22 | Disposition: A | Discharge status: Home / Self Care | Payer: Medicare HMO | Source: Ambulatory Visit | Attending: Gastroenterology | Admitting: Gastroenterology

## 2023-04-22 ENCOUNTER — Encounter (HOSPITAL_COMMUNITY): Payer: Self-pay

## 2023-04-22 VITALS — BP 131/38 | HR 52 | Temp 97.7°F

## 2023-04-22 DIAGNOSIS — K7460 Unspecified cirrhosis of liver: Secondary | ICD-10-CM | POA: Diagnosis not present

## 2023-04-22 DIAGNOSIS — R188 Other ascites: Secondary | ICD-10-CM | POA: Diagnosis not present

## 2023-04-22 LAB — BODY FLUID CELL COUNT WITH DIFFERENTIAL
Eos, Fluid: 0 %
Lymphs, Fluid: 69 %
Monocyte-Macrophage-Serous Fluid: 18 % — ABNORMAL LOW (ref 50–90)
Neutrophil Count, Fluid: 13 % (ref 0–25)
Total Nucleated Cell Count, Fluid: 719 uL (ref 0–1000)

## 2023-04-22 LAB — GRAM STAIN: Gram Stain: NONE SEEN

## 2023-04-22 MED ORDER — LIDOCAINE HCL (PF) 2 % IJ SOLN
INTRAMUSCULAR | Status: AC
  Filled 2023-04-22: qty 10

## 2023-04-22 NOTE — Progress Notes (Signed)
Pt brought to [place] Rm 1 ambulatory, no obvious distress. Procedure explained, consent obtained. Prepped and draped in sterile manner. Local anesthetic admin without adverse reaction. Access obtained at RLQ, connected to suction. Clear amber serous fluid retrieved. 3300 total removed. Access withdrawn without complication. Dermabond applied, bandage in place. Transported to waiting area for DC, in no obvious distress.

## 2023-04-24 ENCOUNTER — Other Ambulatory Visit (INDEPENDENT_AMBULATORY_CARE_PROVIDER_SITE_OTHER): Payer: Self-pay | Admitting: Gastroenterology

## 2023-04-24 DIAGNOSIS — K7460 Unspecified cirrhosis of liver: Secondary | ICD-10-CM | POA: Diagnosis not present

## 2023-04-24 DIAGNOSIS — I5022 Chronic systolic (congestive) heart failure: Secondary | ICD-10-CM | POA: Diagnosis not present

## 2023-04-24 DIAGNOSIS — R188 Other ascites: Secondary | ICD-10-CM

## 2023-04-24 DIAGNOSIS — N1832 Chronic kidney disease, stage 3b: Secondary | ICD-10-CM | POA: Diagnosis not present

## 2023-04-24 LAB — CYTOLOGY - NON PAP

## 2023-04-27 LAB — CULTURE, BODY FLUID W GRAM STAIN -BOTTLE
Culture: NO GROWTH
Special Requests: ADEQUATE

## 2023-05-04 ENCOUNTER — Other Ambulatory Visit (INDEPENDENT_AMBULATORY_CARE_PROVIDER_SITE_OTHER): Payer: Self-pay | Admitting: Gastroenterology

## 2023-05-04 DIAGNOSIS — R188 Other ascites: Secondary | ICD-10-CM

## 2023-05-06 DIAGNOSIS — G4733 Obstructive sleep apnea (adult) (pediatric): Secondary | ICD-10-CM | POA: Diagnosis not present

## 2023-05-07 DIAGNOSIS — G4733 Obstructive sleep apnea (adult) (pediatric): Secondary | ICD-10-CM | POA: Diagnosis not present

## 2023-05-13 ENCOUNTER — Other Ambulatory Visit (INDEPENDENT_AMBULATORY_CARE_PROVIDER_SITE_OTHER): Payer: Self-pay | Admitting: *Deleted

## 2023-05-13 ENCOUNTER — Ambulatory Visit (HOSPITAL_COMMUNITY)
Admission: RE | Admit: 2023-05-13 | Discharge: 2023-05-13 | Disposition: A | Discharge status: Home / Self Care | Payer: Medicare HMO | Source: Ambulatory Visit | Attending: Gastroenterology | Admitting: Gastroenterology

## 2023-05-13 ENCOUNTER — Encounter (HOSPITAL_COMMUNITY): Payer: Self-pay

## 2023-05-13 ENCOUNTER — Telehealth (INDEPENDENT_AMBULATORY_CARE_PROVIDER_SITE_OTHER): Payer: Self-pay | Admitting: *Deleted

## 2023-05-13 ENCOUNTER — Encounter (INDEPENDENT_AMBULATORY_CARE_PROVIDER_SITE_OTHER): Payer: Self-pay | Admitting: *Deleted

## 2023-05-13 DIAGNOSIS — K7460 Unspecified cirrhosis of liver: Secondary | ICD-10-CM | POA: Diagnosis not present

## 2023-05-13 DIAGNOSIS — R188 Other ascites: Secondary | ICD-10-CM | POA: Insufficient documentation

## 2023-05-13 DIAGNOSIS — K7469 Other cirrhosis of liver: Secondary | ICD-10-CM | POA: Diagnosis not present

## 2023-05-13 DIAGNOSIS — Z515 Encounter for palliative care: Secondary | ICD-10-CM | POA: Diagnosis not present

## 2023-05-13 LAB — BODY FLUID CELL COUNT WITH DIFFERENTIAL
Eos, Fluid: 0 %
Lymphs, Fluid: 71 %
Monocyte-Macrophage-Serous Fluid: 20 % — ABNORMAL LOW (ref 50–90)
Neutrophil Count, Fluid: 9 % (ref 0–25)
Total Nucleated Cell Count, Fluid: 607 uL (ref 0–1000)

## 2023-05-13 LAB — GRAM STAIN: Gram Stain: NONE SEEN

## 2023-05-13 NOTE — Telephone Encounter (Signed)
Patient in reminder file.  lab order put in for AFP and printed with letter and mailed to patient to do in December.

## 2023-05-13 NOTE — Progress Notes (Signed)
Patient tolerated left sided paracentesis procedure well today and 1.9 Liters of cloudy ascites removed with labs collected and sent for processing. Patient verbalized understanding of discharge instructions and left via wheelchair with wife with no acute distress noted.

## 2023-05-13 NOTE — CV Procedure (Signed)
PROCEDURE SUMMARY:  Successful image-guided paracentesis from the left abdomen.  Yielded 1.9 liters of hazy yellow fluid.  No immediate complications.  EBL: zero Patient tolerated well.   Specimen were sent for labs.  Please see imaging section of Epic for full dictation.  Villa Herb PA-C 05/13/2023 12:18 PM

## 2023-05-18 LAB — CYTOLOGY - NON PAP

## 2023-05-18 LAB — CULTURE, BODY FLUID W GRAM STAIN -BOTTLE: Culture: NO GROWTH

## 2023-05-19 DIAGNOSIS — I4891 Unspecified atrial fibrillation: Secondary | ICD-10-CM | POA: Diagnosis not present

## 2023-05-19 DIAGNOSIS — J9621 Acute and chronic respiratory failure with hypoxia: Secondary | ICD-10-CM | POA: Diagnosis not present

## 2023-05-28 DIAGNOSIS — K7460 Unspecified cirrhosis of liver: Secondary | ICD-10-CM | POA: Diagnosis not present

## 2023-05-28 DIAGNOSIS — N1832 Chronic kidney disease, stage 3b: Secondary | ICD-10-CM | POA: Diagnosis not present

## 2023-05-28 DIAGNOSIS — I130 Hypertensive heart and chronic kidney disease with heart failure and stage 1 through stage 4 chronic kidney disease, or unspecified chronic kidney disease: Secondary | ICD-10-CM | POA: Diagnosis not present

## 2023-05-28 DIAGNOSIS — I5022 Chronic systolic (congestive) heart failure: Secondary | ICD-10-CM | POA: Diagnosis not present

## 2023-05-29 DIAGNOSIS — Z79899 Other long term (current) drug therapy: Secondary | ICD-10-CM | POA: Diagnosis not present

## 2023-05-29 DIAGNOSIS — N1832 Chronic kidney disease, stage 3b: Secondary | ICD-10-CM | POA: Diagnosis not present

## 2023-05-29 DIAGNOSIS — I480 Paroxysmal atrial fibrillation: Secondary | ICD-10-CM | POA: Diagnosis not present

## 2023-05-29 DIAGNOSIS — I5022 Chronic systolic (congestive) heart failure: Secondary | ICD-10-CM | POA: Diagnosis not present

## 2023-05-29 DIAGNOSIS — R188 Other ascites: Secondary | ICD-10-CM | POA: Diagnosis not present

## 2023-05-29 DIAGNOSIS — K7460 Unspecified cirrhosis of liver: Secondary | ICD-10-CM | POA: Diagnosis not present

## 2023-05-30 LAB — AFP TUMOR MARKER: AFP, Serum, Tumor Marker: 3.1 ng/mL (ref 0.0–8.4)

## 2023-06-03 ENCOUNTER — Other Ambulatory Visit: Payer: Self-pay | Admitting: Cardiology

## 2023-06-05 DIAGNOSIS — K7460 Unspecified cirrhosis of liver: Secondary | ICD-10-CM | POA: Diagnosis not present

## 2023-06-05 DIAGNOSIS — N1832 Chronic kidney disease, stage 3b: Secondary | ICD-10-CM | POA: Diagnosis not present

## 2023-06-05 DIAGNOSIS — I5022 Chronic systolic (congestive) heart failure: Secondary | ICD-10-CM | POA: Diagnosis not present

## 2023-06-06 DIAGNOSIS — G4733 Obstructive sleep apnea (adult) (pediatric): Secondary | ICD-10-CM | POA: Diagnosis not present

## 2023-06-07 DIAGNOSIS — G4733 Obstructive sleep apnea (adult) (pediatric): Secondary | ICD-10-CM | POA: Diagnosis not present

## 2023-06-08 ENCOUNTER — Other Ambulatory Visit: Payer: Self-pay

## 2023-06-08 DIAGNOSIS — I6523 Occlusion and stenosis of bilateral carotid arteries: Secondary | ICD-10-CM

## 2023-06-10 ENCOUNTER — Ambulatory Visit (HOSPITAL_COMMUNITY)
Admission: RE | Admit: 2023-06-10 | Discharge: 2023-06-10 | Disposition: A | Discharge status: Home / Self Care | Payer: Medicare HMO | Source: Ambulatory Visit | Attending: Gastroenterology | Admitting: Gastroenterology

## 2023-06-10 ENCOUNTER — Encounter (HOSPITAL_COMMUNITY): Payer: Self-pay

## 2023-06-10 VITALS — BP 132/50 | HR 58 | Resp 16

## 2023-06-10 DIAGNOSIS — K7460 Unspecified cirrhosis of liver: Secondary | ICD-10-CM | POA: Diagnosis not present

## 2023-06-10 DIAGNOSIS — I509 Heart failure, unspecified: Secondary | ICD-10-CM | POA: Diagnosis not present

## 2023-06-10 DIAGNOSIS — R188 Other ascites: Secondary | ICD-10-CM | POA: Diagnosis not present

## 2023-06-10 LAB — BODY FLUID CELL COUNT WITH DIFFERENTIAL
Eos, Fluid: 0 %
Lymphs, Fluid: 79 %
Monocyte-Macrophage-Serous Fluid: 5 % — ABNORMAL LOW (ref 50–90)
Neutrophil Count, Fluid: 16 % (ref 0–25)
Total Nucleated Cell Count, Fluid: 670 uL (ref 0–1000)

## 2023-06-10 LAB — GRAM STAIN: Gram Stain: NONE SEEN

## 2023-06-10 NOTE — Procedures (Addendum)
 PROCEDURE SUMMARY:  Successful image-guided paracentesis from the right lower abdomen.  Yielded 4.0 liters of chylous appearing fluid.  No immediate complications.  EBL < 1 mL Patient tolerated well.   Specimen was sent for labs.  Please see imaging section of Epic for full dictation.  Patient deemed TIPS candidate by Dr. Hughes 12/01/22 pending further workup, however this has been delayed due to recurrent hospital admissions. Patient will need repeat labs and follow up with Dr. Tedra to discuss ongoing candidacy when GI deems appropriate.  Clotilda DELENA Hesselbach PA-C 06/10/2023 11:01 AM

## 2023-06-10 NOTE — Progress Notes (Signed)
 Patient tolerated right sided paracentesis procedure well today and 4 Liters of cloudy yellow ascites removed with labs collected and sent for processing. Patient verbalized understanding of discharge instructions and left via wheelchair with wife with no acute distress noted.

## 2023-06-12 DIAGNOSIS — H6123 Impacted cerumen, bilateral: Secondary | ICD-10-CM | POA: Diagnosis not present

## 2023-06-12 LAB — CYTOLOGY - NON PAP

## 2023-06-13 ENCOUNTER — Other Ambulatory Visit: Payer: Self-pay | Admitting: Cardiology

## 2023-06-15 LAB — CULTURE, BODY FLUID W GRAM STAIN -BOTTLE: Culture: NO GROWTH

## 2023-06-16 ENCOUNTER — Ambulatory Visit (INDEPENDENT_AMBULATORY_CARE_PROVIDER_SITE_OTHER): Payer: Medicare HMO | Admitting: Gastroenterology

## 2023-06-16 ENCOUNTER — Encounter (INDEPENDENT_AMBULATORY_CARE_PROVIDER_SITE_OTHER): Payer: Self-pay | Admitting: Gastroenterology

## 2023-06-16 VITALS — BP 126/56 | HR 69 | Temp 97.5°F | Ht 69.0 in | Wt 184.3 lb

## 2023-06-16 DIAGNOSIS — K7460 Unspecified cirrhosis of liver: Secondary | ICD-10-CM | POA: Diagnosis not present

## 2023-06-16 DIAGNOSIS — R188 Other ascites: Secondary | ICD-10-CM

## 2023-06-16 NOTE — Patient Instructions (Addendum)
-  you are due for ultrasound of the liver which we will get scheduled -You are up to date on labs at this time  -continue spironloactone 25mg  daily -continue lasix  at current dose -continue lactulose  45g 2-3 times per day, you can titrate dose with goal of 2-3 soft stools per day -I will refer you back to Interventional radiology for further evaluation of possible TIPS procedure  -Reduce salt intake to <2 g per day - Can take Tylenol  max of 2 g per day (650 mg q8h) for pain - Avoid NSAIDs for pain - Avoid eating raw oysters/shellfish - Ensure shake every night before going to sleep  We will push follow up out to 6 months, however, please do not hesitate to call me if you have any new or worsening issues and need to be seen sooner!  It was a pleasure to see you today. I want to create trusting relationships with patients and provide genuine, compassionate, and quality care. I truly value your feedback! please be on the lookout for a survey regarding your visit with me today. I appreciate your input about our visit and your time in completing this!    Edwardo Wojnarowski L. Emmerson Shuffield, MSN, APRN, AGNP-C Adult-Gerontology Nurse Practitioner [facility]

## 2023-06-16 NOTE — Progress Notes (Addendum)
 Referring Provider: Sheryle Carwin, MD Primary Care Physician:  Sheryle Carwin, MD Primary GI Physician: Dr. Eartha   Chief Complaint  Patient presents with   Follow-up    Patient here today for a follow up. He has issues with gas, and says he has bm five days out of a week. He is taking 67.5 ml bid. He is taking spironolactone  25 mg daily,and Furosemide  80 mg bid, he will have at least one boost per day. Patient reports he had a paracentesis last Wednesday.   HPI:   Caleb Wolf is a 83 y.o. 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  Feeling pretty good denies any episodes of confusion.  Some dependent edema to the legs which improves with elevation.  Continued moderate swelling to abdomen, having regular paracentesis.  Completed evaluation with ID and no SBP found, not currently on Bactrim  DS as Dr. Lindia took him off due to cultures not being consistent with SBP.  Appetite is not great but eats pretty well for the most part.  Trying to follow 2 g sodium diet.  Recommended continue lactulose  45 g 3 times daily, continue spironolactone  25 mg daily, Lasix  120 mg / 160 mg alternating, continue with routine paracentesis as needed, 2 g sodium diet, repeat AFP in December, liver imaging due in January, advised to monitor off of antibiotics as discussed with ID, proceed with TIPS evaluation via IR.  Present: Feeling pretty good today. Having a BM usually daily. Occasionally will skip a day but not often. He did miss a dose of lactulose  yesterday evening and seemed a bit confused this morning per his wife. Feels that he does pretty well with his memory as long as he takes his lactulose .  He had para a few days ago and is feeling better. Denies  swelling to LEs.    Last MELD: 03/2023  Cirrhosis related questions: Hematemesis/coffee ground emesis: No Abdominal pain: No Abdominal distention/worsening ascites: Yes Fever/chills: No Episodes of confusion/disorientation: none Number of daily bowel movements:as above, 1-2 per day  on average Taking diuretics?: Yes, lasix  120/160mg  alternating every other day, aldactone  25mg  daily History of variceal bleeding: No Prior history of banding?: No Prior episodes of SBP: yes Last time liver imaging was performed: 12/24/22 via CT without liver lesions  Last AFP: 3.1 05/29/23 Currently consuming alcohol: No     Last EGD: 12/30/2017,mid esophagus was dilated,Partial fundal wrap, presence of lipoma in the second portion of the duodenum. Last Colonoscopy: 12/30/2017, small polyps in the cecum which were removed biopsy, diverticulosis and external hemorrhoids. Pathology consistent with 1 tubular adenoma.   Recommended repeating a colonoscopy in 7 years   Past Medical History:  Diagnosis Date   AAA (abdominal aortic aneurysm) (HCC)    Arthritis    Basal cell carcinoma    Carotid stenosis    Chronic kidney disease, stage 3b (HCC) 12/13/2021   Coronary artery disease    Multvessel s/p CABG 2015   Enlarged prostate    Essential hypertension    GERD (gastroesophageal reflux disease)    Gout    History of colon polyps    History of kidney stones    Hyperlipidemia    Leukocytosis 01/21/2023   Lumbar disc disease  Persistent atrial fibrillation (HCC)    Sleep apnea    CPAP    Past Surgical History:  Procedure Laterality Date   ABLATION OF DYSRHYTHMIC FOCUS  07/28/2017   ATRIAL FIBRILLATION ABLATION N/A 07/28/2017   Procedure: ATRIAL FIBRILLATION ABLATION;  Surgeon: Kelsie Agent, MD;  Location: [facility];  Service: Cardiovascular;  Laterality: N/A;   ATRIAL FIBRILLATION ABLATION N/A 12/04/2020   Procedure: ATRIAL FIBRILLATION ABLATION;  Surgeon: Kelsie Agent, MD;  Location:  [facility];  Service: Cardiovascular;  Laterality: N/A;   BACK SURGERY     CARDIAC CATHETERIZATION  05/22/2014   Procedure: IABP INSERTION;  Surgeon: Alm LELON Clay, MD;  Location: [facility] CATH LAB;  Service: Cardiovascular;;   CARDIOVERSION N/A 04/29/2017   Procedure: CARDIOVERSION;  Surgeon: Debera Jayson MATSU, MD;  Location: [facility];  Service: Cardiovascular;  Laterality: N/A;   CARDIOVERSION N/A 08/13/2017   Procedure: CARDIOVERSION;  Surgeon: Mona Vinie BROCKS, MD;  Location: [facility] ENDOSCOPY;  Service: Cardiovascular;  Laterality: N/A;   CARDIOVERSION N/A 08/15/2019   Procedure: CARDIOVERSION;  Surgeon: Maranda Leim DEL, MD;  Location: [facility] ENDOSCOPY;  Service: Cardiovascular;  Laterality: N/A;   CARDIOVERSION N/A 03/09/2020   Procedure: CARDIOVERSION;  Surgeon: Lonni Slain, MD;  Location: [facility] ENDOSCOPY;  Service: Cardiovascular;  Laterality: N/A;   CARDIOVERSION N/A 07/06/2020   Procedure: CARDIOVERSION;  Surgeon: Francyne Headland, MD;  Location: [facility] ENDOSCOPY;  Service: Cardiovascular;  Laterality: N/A;   CARDIOVERSION N/A 01/18/2021   Procedure: CARDIOVERSION;  Surgeon: Raford Riggs, MD;  Location: [facility] ENDOSCOPY;  Service: Cardiovascular;  Laterality: N/A;   CARDIOVERSION N/A 06/17/2021   Procedure: CARDIOVERSION;  Surgeon: Barbaraann Darryle Ned, MD;  Location: [facility] ENDOSCOPY;  Service: Cardiovascular;  Laterality: N/A;   Cataract surgery Right    COLONOSCOPY     COLONOSCOPY N/A 12/30/2017   Procedure: COLONOSCOPY;  Surgeon: Golda Claudis PENNER, MD;  Location: [facility];  Service: Endoscopy;  Laterality: N/A;   CORONARY ARTERY BYPASS GRAFT N/A 05/22/2014   Procedure: CORONARY ARTERY BYPASS GRAFTING (CABG) times three using left internal mammary and right saphenous vein.;  Surgeon: Elspeth BROCKS Millers, MD;  Location: [facility] OR;  Service: Open Heart Surgery;  Laterality: N/A;   ENDARTERECTOMY Left 02/17/2014   Procedure: ENDARTERECTOMY CAROTID WITH PATCH ANGIOPLASTY;  Surgeon:  Agent JONETTA Collum, MD;  Location: [facility] OR;  Service: Vascular;  Laterality: Left;   ESOPHAGEAL DILATION N/A 08/22/2015   Procedure: ESOPHAGEAL DILATION;  Surgeon: Claudis PENNER Golda, MD;  Location: [facility];  Service: Endoscopy;  Laterality: N/A;   ESOPHAGOGASTRODUODENOSCOPY N/A 08/22/2015   Procedure: ESOPHAGOGASTRODUODENOSCOPY (EGD);  Surgeon: Claudis PENNER Golda, MD;  Location: [facility];  Service: Endoscopy;  Laterality: N/A;  12:45 - moved to 1:55 - Ann notified pt   ESOPHAGOGASTRODUODENOSCOPY N/A 12/30/2017   Procedure: ESOPHAGOGASTRODUODENOSCOPY (EGD);  Surgeon: Golda Claudis PENNER, MD;  Location: [facility];  Service: Endoscopy;  Laterality: N/A;  200   EYE SURGERY     cataract extraction (right) with repair macular tear , with IOL     right   FRACTURE SURGERY     bilateral wrist fractures- one ORIF   IR RADIOLOGIST EVAL & MGMT  12/01/2022   JOINT REPLACEMENT  2011   left knee   LEFT HEART CATHETERIZATION WITH CORONARY ANGIOGRAM N/A 05/22/2014   Procedure: LEFT HEART CATHETERIZATION WITH CORONARY ANGIOGRAM;  Surgeon: Alm LELON Clay, MD;  Location: [facility] CATH LAB;  Service: Cardiovascular;  Laterality: N/A;   LITHOTRIPSY     PARS  PLANA VITRECTOMY W/ REPAIR OF MACULAR HOLE     RHINOPLASTY     TEE WITHOUT CARDIOVERSION N/A 04/29/2017   Procedure: TRANSESOPHAGEAL ECHOCARDIOGRAM (TEE) WITH PROPOFOL ;  Surgeon: Debera Jayson MATSU, MD;  Location: [facility];  Service: Cardiovascular;  Laterality: N/A;   TONSILLECTOMY     TOTAL HIP ARTHROPLASTY  12/08/2011   Procedure: TOTAL HIP ARTHROPLASTY;  Surgeon: Dempsey LULLA Moan, MD;  Location: [facility] ORS;  Service: Orthopedics;  Laterality: Right;   UPPER GI ENDOSCOPY  12/18/2015   Procedure: UPPER GI ENDOSCOPY;  Surgeon: Lynda Leos, MD;  Location: [facility] ORS;  Service: General;;   WRIST SURGERY Right 56yrs ago   WRIST SURGERY Left     Current Outpatient Medications  Medication Sig Dispense Refill   acetaminophen  (TYLENOL ) 500 MG tablet Take 1,000 mg by  mouth every 6 (six) hours as needed for moderate pain.     amiodarone  (PACERONE ) 200 MG tablet TAKE 1 TABLET BY MOUTH EVERY DAY 90 tablet 1   atorvastatin  (LIPITOR) 80 MG tablet TAKE 1 TABLET BY MOUTH EVERY DAY IN THE EVENING 90 tablet 1   Cholecalciferol  (VITAMIN D ) 50 MCG (2000 UT) CAPS Take 2,000 Units by mouth daily.     cyanocobalamin  (VITAMIN B12) 1000 MCG tablet Take 1,000 mcg by mouth See admin instructions. 2-3 TIMES WEEKLY PER PT'S WIFE     doxazosin  (CARDURA ) 4 MG tablet Take 4 mg by mouth in the morning.  4   empagliflozin  (JARDIANCE ) 10 MG TABS tablet TAKE 1 TABLET BY MOUTH DAILY BEFORE BREAKFAST. 90 tablet 1   feeding supplement (BOOST HIGH PROTEIN) LIQD Take 1 Container by mouth at bedtime. One shake before bedtime.     finasteride  (PROSCAR ) 5 MG tablet Take 5 mg by mouth every evening.      fluticasone  (FLONASE ) 50 MCG/ACT nasal spray Place 1 spray into both nostrils daily as needed for allergies or rhinitis.     furosemide  (LASIX ) 80 MG tablet TAKE 1 TABLET BY MOUTH 2 TIMES DAILY. 180 tablet 2   lactulose  (CHRONULAC ) 10 GM/15ML solution Take 67.5 mLs (45 g total) by mouth 3 (three) times daily. Titrate dosage of lactulose  to 2-3 soft bowels per day 5676 mL 3   nitroGLYCERIN  (NITROSTAT ) 0.4 MG SL tablet Place 1 tablet (0.4 mg total) under the tongue every 5 (five) minutes x 3 doses as needed for chest pain. 75 tablet 3   oxyCODONE -acetaminophen  (PERCOCET/ROXICET) 5-325 MG tablet Take 1 tablet by mouth every 6 (six) hours as needed for severe pain. 6 tablet 0   pantoprazole  (PROTONIX ) 40 MG tablet Take 1 tablet (40 mg total) by mouth daily before breakfast. 90 tablet 3   Rivaroxaban  (XARELTO ) 15 MG TABS tablet Take 1 tablet (15 mg total) by mouth daily with supper. 30 tablet 1   spironolactone  (ALDACTONE ) 25 MG tablet TAKE 1 TABLET (25 MG TOTAL) BY MOUTH DAILY. 90 tablet 3   No current facility-administered medications for this visit.    Allergies as of 06/16/2023 - Review  Complete 06/16/2023  Allergen Reaction Noted   Codeine sulfate Nausea Only 12/01/2011    Family History  Problem Relation Age of Onset   Allergies Mother    Heart disease Mother    Hypertension Mother    Heart disease Father        MI    Social History   Socioeconomic History   Marital status: Married    Spouse name: Not on file   Number of children: Not on file  Years of education: Not on file   Highest education level: Not on file  Occupational History   Occupation: retired    Comment: naval architect [employer]  Tobacco Use   Smoking status: Former    Current packs/day: 0.00    Average packs/day: 1.5 packs/day for 25.0 years (37.5 ttl pk-yrs)    Types: Cigarettes    Start date: 08/08/1960    Quit date: 06/02/1982    Years since quitting: 41.0    Passive exposure: Never   Smokeless tobacco: Never   Tobacco comments:    quit smoking 30+yrs ago  Vaping Use   Vaping status: Never Used  Substance and Sexual Activity   Alcohol use: No    Alcohol/week: 0.0 standard drinks of alcohol    Comment: occasionally    Drug use: No   Sexual activity: Yes  Other Topics Concern   Not on file  Social History Narrative   Not on file   Social Drivers of Health   Financial Resource Strain: Not on file  Food Insecurity: No Food Insecurity (01/14/2023)   Hunger Vital Sign    Worried About Running Out of Food in the Last Year: Never true    Ran Out of Food in the Last Year: Never true  Transportation Needs: No Transportation Needs (01/14/2023)   PRAPARE - Administrator, Civil Service (Medical): No    Lack of Transportation (Non-Medical): No  Physical Activity: Not on file  Stress: Not on file  Social Connections: Not on file    Review of systems General: negative for malaise, night sweats, fever, chills, weight los Neck: Negative for lumps, goiter, pain and significant neck swelling Resp: Negative for cough, wheezing, dyspnea at rest CV: Negative for chest pain,  leg swelling, palpitations, orthopnea GI: denies melena, hematochezia, nausea, vomiting, diarrhea, constipation, dysphagia, odyonophagia, early satiety or unintentional weight loss.  MSK: Negative for joint pain or swelling, back pain, and muscle pain. Derm: Negative for itching or rash Psych: Denies depression, anxiety, memory loss, confusion. No homicidal or suicidal ideation.  Heme: Negative for prolonged bleeding, bruising easily, and swollen nodes. Endocrine: Negative for cold or heat intolerance, polyuria, polydipsia and goiter. Neuro: negative for tremor, gait imbalance, syncope and seizures. The remainder of the review of systems is noncontributory.  Physical Exam: BP (!) 126/56 (BP Location: Left Arm, Patient Position: Sitting, Cuff Size: Normal)   Pulse 69   Temp (!) 97.5 F (36.4 C) (Temporal)   Ht 5' 9 (1.753 m)   Wt 184 lb 4.8 oz (83.6 kg)   BMI 27.22 kg/m  General:   Alert and oriented. No distress noted. Pleasant and cooperative.  Head:  Normocephalic and atraumatic. Eyes:  Conjuctiva clear without scleral icterus. Mouth:  Oral mucosa pink and moist. Good dentition. No lesions. Heart: Normal rate and rhythm, s1 and s2 heart sounds present.  Lungs: Clear lung sounds in all lobes. Respirations equal and unlabored. Abdomen:  +BS, soft, non-tender. Mildly distended but non taut. No rebound or guarding. No HSM or masses noted. Derm: No palmar erythema or jaundice Msk:  Symmetrical without gross deformities. Normal posture. Extremities:  Without edema. Neurologic:  Alert and  oriented x4.no asterixis  Psych:  Alert and cooperative. Normal mood and affect.  Invalid input(s): 6 MONTHS   ASSESSMENT: RAMESSES CRAMPTON is a 83 y.o. male presenting today for follow up of Cirrhosis   Patient doing well today despite some previous decompensating events. He has no GI complaints. Seems to be  stable, requiring paracentesis every 3-4 weeks with most recent para without evidence of  SBP, continue off of SBP prophylaxis at this time as recommended by ID given no true SBP identified, could have been a false positive/contamination. Will need to resume SBP prophylaxis if he has recurrence of SBP.  No episodes of confusion on lactulose . Will continue with 45g BID-TID, titrated to 2-3 soft stools per day.   Last MELD 3.0 03/2023 was 20 which may preclude candidacy but will refer back to Dr. Will with IR for further discussion regarding possible TIPS as my last discussion with Dr. Will he was in agreement to reevaluate the patient to determine if TIPS was deemed appropriate but seems patient was lost to follow up with the IR clinic regarding this.  Will update MELD labs again in April 2025  Due for [facility] screening via RUQ which we will order today.   AFP due again in June 2025, last was 3.1 in December 2024  Plt count has remained above 150k, therefore no indication for EGD for EV screening thus far.    PLAN:  -RUQ US  -continue spironloactone 25mg  daily -continue lasix  at current dose (cards managing)  -MELD labs April 2025 -AFP June 2025 -refer back to IR for further discussion of TIPS procedure  -Reduce salt intake to <2 g per day - Can take Tylenol  max of 2 g per day (650 mg q8h) for pain - Avoid NSAIDs for pain - Avoid eating raw oysters/shellfish - Ensure every night before going to sleep  All questions were answered, patient verbalized understanding and is in agreement with plan as outlined above.   Follow Up: 6 months   Leyli Kevorkian L. Mariette, MSN, APRN, AGNP-C Adult-Gerontology Nurse Practitioner [facility]   **Addendum: Dr. Will reached out to me regarding patient, would like to have updated MELD labs in hopes MELD is <18 so that TIPS procedure can be readdressed, he also requested his staff set up CT A/P with contrast for pre op planning. We will reach out the patient to request he have updated MELD labs drawn.  I have reviewed  the note and agree with the APP's assessment as described in this progress note  Will recheck MELD labs as case was discussed with Dr. Will.  If not a candidate for TIPS, may attempt to increase his diuretics (possibly spironolactone  depending on his potassium level).  Toribio Fortune, MD Gastroenterology and Hepatology [facility] Gastroenterology

## 2023-06-17 ENCOUNTER — Other Ambulatory Visit (INDEPENDENT_AMBULATORY_CARE_PROVIDER_SITE_OTHER): Payer: Self-pay

## 2023-06-17 ENCOUNTER — Telehealth (INDEPENDENT_AMBULATORY_CARE_PROVIDER_SITE_OTHER): Payer: Self-pay | Admitting: *Deleted

## 2023-06-17 ENCOUNTER — Telehealth (INDEPENDENT_AMBULATORY_CARE_PROVIDER_SITE_OTHER): Payer: Self-pay

## 2023-06-17 DIAGNOSIS — I5022 Chronic systolic (congestive) heart failure: Secondary | ICD-10-CM

## 2023-06-17 DIAGNOSIS — N1832 Chronic kidney disease, stage 3b: Secondary | ICD-10-CM

## 2023-06-17 DIAGNOSIS — R042 Hemoptysis: Secondary | ICD-10-CM | POA: Diagnosis not present

## 2023-06-17 DIAGNOSIS — Z951 Presence of aortocoronary bypass graft: Secondary | ICD-10-CM | POA: Diagnosis not present

## 2023-06-17 DIAGNOSIS — K220 Achalasia of cardia: Secondary | ICD-10-CM

## 2023-06-17 DIAGNOSIS — R188 Other ascites: Secondary | ICD-10-CM

## 2023-06-17 DIAGNOSIS — J9611 Chronic respiratory failure with hypoxia: Secondary | ICD-10-CM

## 2023-06-17 DIAGNOSIS — K219 Gastro-esophageal reflux disease without esophagitis: Secondary | ICD-10-CM

## 2023-06-17 DIAGNOSIS — I10 Essential (primary) hypertension: Secondary | ICD-10-CM | POA: Diagnosis not present

## 2023-06-17 DIAGNOSIS — D72829 Elevated white blood cell count, unspecified: Secondary | ICD-10-CM | POA: Diagnosis not present

## 2023-06-17 NOTE — Telephone Encounter (Signed)
 Orders placed in Epic, patient aware to go to [facility] this week.

## 2023-06-17 NOTE — Telephone Encounter (Signed)
-----   Message from Coy S sent at 06/16/2023  1:17 PM EST ----- Thank you. I will get him taken care of ----- Message ----- From: Vallie Gay Sent: 06/16/2023   1:06 PM EST To: Lesli Rasmussen; Haynes Lips  Referral has been placed for this patient - he needs apt with Dr Darylene Epley for TIPS. Thanks.Lorelee Roger

## 2023-06-17 NOTE — Telephone Encounter (Signed)
 I spoke with the patient and made him aware he will need to do CMP, CBC and INR, IR would like updated MELD prior to seeing him for discussion of TIPS . Patient states understanding and will go this week to [facility].

## 2023-06-18 ENCOUNTER — Encounter: Payer: Self-pay | Admitting: Interventional Radiology

## 2023-06-18 ENCOUNTER — Ambulatory Visit (HOSPITAL_COMMUNITY)
Admission: RE | Admit: 2023-06-18 | Discharge: 2023-06-18 | Disposition: A | Discharge status: Home / Self Care | Payer: Medicare HMO | Source: Ambulatory Visit | Attending: Vascular Surgery | Admitting: Vascular Surgery

## 2023-06-18 ENCOUNTER — Ambulatory Visit: Payer: Medicare HMO | Admitting: Physician Assistant

## 2023-06-18 ENCOUNTER — Telehealth (HOSPITAL_COMMUNITY): Payer: Self-pay | Admitting: Radiology

## 2023-06-18 VITALS — BP 166/74 | HR 54 | Temp 97.9°F | Resp 18 | Ht 69.0 in | Wt 183.5 lb

## 2023-06-18 DIAGNOSIS — I6523 Occlusion and stenosis of bilateral carotid arteries: Secondary | ICD-10-CM | POA: Diagnosis not present

## 2023-06-18 DIAGNOSIS — I6521 Occlusion and stenosis of right carotid artery: Secondary | ICD-10-CM | POA: Diagnosis not present

## 2023-06-18 DIAGNOSIS — I6522 Occlusion and stenosis of left carotid artery: Secondary | ICD-10-CM | POA: Diagnosis not present

## 2023-06-18 LAB — COMPREHENSIVE METABOLIC PANEL
ALT: 20 [IU]/L (ref 0–44)
AST: 16 [IU]/L (ref 0–40)
Albumin: 3.5 g/dL — ABNORMAL LOW (ref 3.8–4.8)
Alkaline Phosphatase: 127 [IU]/L — ABNORMAL HIGH (ref 44–121)
BUN/Creatinine Ratio: 14 (ref 10–24)
BUN: 27 mg/dL (ref 8–27)
Bilirubin Total: 0.2 mg/dL (ref 0.0–1.2)
CO2: 26 mmol/L (ref 20–29)
Calcium: 8.6 mg/dL (ref 8.6–10.2)
Chloride: 101 mmol/L (ref 96–106)
Creatinine, Ser: 1.92 mg/dL — ABNORMAL HIGH (ref 0.76–1.27)
Globulin, Total: 2.1 g/dL (ref 1.5–4.5)
Glucose: 83 mg/dL (ref 70–99)
Potassium: 4.4 mmol/L (ref 3.5–5.2)
Sodium: 143 mmol/L (ref 134–144)
Total Protein: 5.6 g/dL — ABNORMAL LOW (ref 6.0–8.5)
eGFR: 35 mL/min/{1.73_m2} — ABNORMAL LOW (ref >59)

## 2023-06-18 LAB — CBC WITH DIFFERENTIAL/PLATELET
Basophils Absolute: 0.1 10*3/uL (ref 0.0–0.2)
Basos: 1 %
EOS (ABSOLUTE): 0.1 10*3/uL (ref 0.0–0.4)
Eos: 2 %
Hematocrit: 36.6 % — ABNORMAL LOW (ref 37.5–51.0)
Hemoglobin: 11.4 g/dL — ABNORMAL LOW (ref 13.0–17.7)
Immature Grans (Abs): 0 10*3/uL (ref 0.0–0.1)
Immature Granulocytes: 1 %
Lymphocytes Absolute: 1 10*3/uL (ref 0.7–3.1)
Lymphs: 15 %
MCH: 29.5 pg (ref 26.6–33.0)
MCHC: 31.1 g/dL — ABNORMAL LOW (ref 31.5–35.7)
MCV: 95 fL (ref 79–97)
Monocytes Absolute: 0.7 10*3/uL (ref 0.1–0.9)
Monocytes: 10 %
Neutrophils Absolute: 4.7 10*3/uL (ref 1.4–7.0)
Neutrophils: 71 %
Platelets: 243 10*3/uL (ref 150–450)
RBC: 3.86 x10E6/uL — ABNORMAL LOW (ref 4.14–5.80)
RDW: 13.6 % (ref 11.6–15.4)
WBC: 6.5 10*3/uL (ref 3.4–10.8)

## 2023-06-18 LAB — PROTIME-INR
INR: 1.2 (ref 0.9–1.2)
Prothrombin Time: 12.8 s — ABNORMAL HIGH (ref 9.1–12.0)

## 2023-06-18 NOTE — Progress Notes (Signed)
HISTORY AND PHYSICAL     CC:  follow up. Requesting Provider:  Carylon Perches, MD  HPI: This is a 83 y.o. male here for follow up for carotid artery stenosis.  Pt is s/p  left CEA for asymptomatic carotid artery stenosis on 02/17/2014 by Dr. Hart Rochester.   He had a resection of a redundant segment of the left internal carotid artery with primary reanastomosis.   He has a known right ICA occlusion.    He underwent CABG with [facility] of right GSV 05/22/2014 by Dr. Dorris Fetch.  Pt was last seen 06/10/2022 and at that time and he was not having any neurological sx.   Pt returns today for follow up & here with his wife of 54 years.    Pt denies any amaurosis fugax, speech difficulties, weakness, numbness, paralysis or clumsiness or facial droop.    His wife states that he has liver disease with ascites and has been having paracentesis about every 3 weeks.  They are in discussions for TIPS.  He has had some falls and has been undergoing therapy and has HH nurse coming to the house.   She states his leg swelling is much better with the high dose of lasix.    He states his blood pressure is elevated at today's visit.  He takes it regularly at home and it is much lower.   The pt is on a statin for cholesterol management.  The pt is not on a daily aspirin.   Other AC:  Xarelto The pt is on diuretic  The pt is  on medication for diabetes Tobacco hx:  former   Past Medical History:  Diagnosis Date   AAA (abdominal aortic aneurysm) (HCC)    Arthritis    Basal cell carcinoma    Carotid stenosis    Chronic kidney disease, stage 3b (HCC) 12/13/2021   Coronary artery disease    Multvessel s/p CABG 2015   Enlarged prostate    Essential hypertension    GERD (gastroesophageal reflux disease)    Gout    History of colon polyps    History of kidney stones    Hyperlipidemia    Leukocytosis 01/21/2023   Lumbar disc disease    Persistent atrial fibrillation (HCC)    Sleep apnea    CPAP    Past Surgical  History:  Procedure Laterality Date   ABLATION OF DYSRHYTHMIC FOCUS  07/28/2017   ATRIAL FIBRILLATION ABLATION N/A 07/28/2017   Procedure: ATRIAL FIBRILLATION ABLATION;  Surgeon: Hillis Range, MD;  Location: [facility] INVASIVE CV LAB;  Service: Cardiovascular;  Laterality: N/A;   ATRIAL FIBRILLATION ABLATION N/A 12/04/2020   Procedure: ATRIAL FIBRILLATION ABLATION;  Surgeon: Hillis Range, MD;  Location: [facility] INVASIVE CV LAB;  Service: Cardiovascular;  Laterality: N/A;   BACK SURGERY     CARDIAC CATHETERIZATION  05/22/2014   Procedure: IABP INSERTION;  Surgeon: Marykay Lex, MD;  Location: [facility] CATH LAB;  Service: Cardiovascular;;   CARDIOVERSION N/A 04/29/2017   Procedure: CARDIOVERSION;  Surgeon: Jonelle Sidle, MD;  Location: [facility] ENDO SUITE;  Service: Cardiovascular;  Laterality: N/A;   CARDIOVERSION N/A 08/13/2017   Procedure: CARDIOVERSION;  Surgeon: Chrystie Nose, MD;  Location: [facility] ENDOSCOPY;  Service: Cardiovascular;  Laterality: N/A;   CARDIOVERSION N/A 08/15/2019   Procedure: CARDIOVERSION;  Surgeon: Lars Masson, MD;  Location: [facility] ENDOSCOPY;  Service: Cardiovascular;  Laterality: N/A;   CARDIOVERSION N/A 03/09/2020   Procedure: CARDIOVERSION;  Surgeon: Jodelle Red, MD;  Location: [facility] ENDOSCOPY;  Service:  Cardiovascular;  Laterality: N/A;   CARDIOVERSION N/A 07/06/2020   Procedure: CARDIOVERSION;  Surgeon: Thurmon Fair, MD;  Location: [facility] ENDOSCOPY;  Service: Cardiovascular;  Laterality: N/A;   CARDIOVERSION N/A 01/18/2021   Procedure: CARDIOVERSION;  Surgeon: Chilton Si, MD;  Location: [facility] ENDOSCOPY;  Service: Cardiovascular;  Laterality: N/A;   CARDIOVERSION N/A 06/17/2021   Procedure: CARDIOVERSION;  Surgeon: Sande Rives, MD;  Location: [facility] ENDOSCOPY;  Service: Cardiovascular;  Laterality: N/A;   Cataract surgery Right    COLONOSCOPY     COLONOSCOPY N/A 12/30/2017   Procedure: COLONOSCOPY;  Surgeon: Malissa Hippo, MD;  Location: [facility];  Service:  Endoscopy;  Laterality: N/A;   CORONARY ARTERY BYPASS GRAFT N/A 05/22/2014   Procedure: CORONARY ARTERY BYPASS GRAFTING (CABG) times three using left internal mammary and right saphenous vein.;  Surgeon: Loreli Slot, MD;  Location[facility] OR;  Service: Open Heart Surgery;  Laterality: N/A;   ENDARTERECTOMY Left 02/17/2014   Procedure: ENDARTERECTOMY CAROTID WITH PATCH ANGIOPLASTY;  Surgeon: Pryor Ochoa, MD;  Location[facility] OR;  Service: Vascular;  Laterality: Left;   ESOPHAGEAL DILATION N/A 08/22/2015   Procedure: ESOPHAGEAL DILATION;  Surgeon: Malissa Hippo, MD;  Location: [facility];  Service: Endoscopy;  Laterality: N/A;   ESOPHAGOGASTRODUODENOSCOPY N/A 08/22/2015   Procedure: ESOPHAGOGASTRODUODENOSCOPY (EGD);  Surgeon: Malissa Hippo, MD;  Location: [facility] ENDO [facility];  Service: Endoscopy;  Laterality: N/A;  12:45 - moved to 1:55 - Ann notified pt   ESOPHAGOGASTRODUODENOSCOPY N/A 12/30/2017   Procedure: ESOPHAGOGASTRODUODENOSCOPY (EGD);  Surgeon: Malissa Hippo, MD;  Location: [facility] END[facility];  Service: Endoscopy;  Laterality: N/A;  200   EYE SURGERY     cataract extraction (right) with repair macular tear , with IOL     right   FRACTURE SURGERY     bilateral wrist fractures- one ORIF   IR RADIOLOGIST EVAL & MGMT  12/01/2022   JOINT REPLACEMENT  2011   left knee   LEFT HEART CATHETERIZATION WITH CORONARY ANGIOGRAM N/A 05/22/2014   Procedure: LEFT HEART CATHETERIZATION WITH CORONARY ANGIOGRAM;  Surgeon: Marykay Lex, MD;  Location: [facility] CATH LAB;  Service: Cardiovascular;  Laterality: N/A;   LITHOTRIPSY     PARS PLANA VITRECTOMY W/ REPAIR OF MACULAR HOLE     RHINOPLASTY     TEE WITHOUT CARDIOVERSION N/A 04/29/2017   Procedure: TRANSESOPHAGEAL ECHOCARDIOGRAM (TEE) WITH PROPOFOL;  Surgeon: Jonelle Sidle, MD;  Location: [facility];  Service: Cardiovascular;  Laterality: N/A;   TONSILLECTOMY     TOTAL HIP ARTHROPLASTY  12/08/2011   Procedure: TOTAL HIP ARTHROPLASTY;  Surgeon: Loanne Drilling, MD;  Location: [facility] ORS;  Service: Orthopedics;  Laterality: Right;   UPPER GI ENDOSCOPY  12/18/2015   Procedure: UPPER GI ENDOSCOPY;  Surgeon: Axel Filler, MD;  Location: [facility] ORS;  Service: General;;   WRIST SURGERY Right 37yrs ago   WRIST SURGERY Left     Allergies  Allergen Reactions   Codeine Sulfate Nausea Only    Current Outpatient Medications  Medication Sig Dispense Refill   acetaminophen (TYLENOL) 500 MG tablet Take 1,000 mg by mouth every 6 (six) hours as needed for moderate pain.     amiodarone (PACERONE) 200 MG tablet TAKE 1 TABLET BY MOUTH EVERY DAY 90 tablet 1   atorvastatin (LIPITOR) 80 MG tablet TAKE 1 TABLET BY MOUTH EVERY DAY IN THE EVENING 90 tablet 1   Cholecalciferol (VITAMIN D) 50 MCG (2000 UT) CAPS Take 2,000 Units by mouth daily.  cyanocobalamin (VITAMIN B12) 1000 MCG tablet Take 1,000 mcg by mouth See admin instructions. 2-3 TIMES WEEKLY PER PT'S WIFE     doxazosin (CARDURA) 4 MG tablet Take 4 mg by mouth in the morning.  4   empagliflozin (JARDIANCE) 10 MG TABS tablet TAKE 1 TABLET BY MOUTH DAILY BEFORE BREAKFAST. 90 tablet 1   feeding supplement (BOOST HIGH PROTEIN) LIQD Take 1 Container by mouth at bedtime. One shake before bedtime.     finasteride (PROSCAR) 5 MG tablet Take 5 mg by mouth every evening.      fluticasone (FLONASE) 50 MCG/ACT nasal spray Place 1 spray into both nostrils daily as needed for allergies or rhinitis.     furosemide (LASIX) 80 MG tablet TAKE 1 TABLET BY MOUTH 2 TIMES DAILY. 180 tablet 2   lactulose (CHRONULAC) 10 GM/15ML solution Take 67.5 mLs (45 g total) by mouth 3 (three) times daily. Titrate dosage of lactulose to 2-3 soft bowels per day 5676 mL 3   nitroGLYCERIN (NITROSTAT) 0.4 MG SL tablet Place 1 tablet (0.4 mg total) under the tongue every 5 (five) minutes x 3 doses as needed for chest pain. 75 tablet 3   oxyCODONE-acetaminophen (PERCOCET/ROXICET) 5-325 MG tablet Take 1 tablet by mouth every 6 (six) hours as needed  for severe pain. 6 tablet 0   pantoprazole (PROTONIX) 40 MG tablet Take 1 tablet (40 mg total) by mouth daily before breakfast. 90 tablet 3   Rivaroxaban (XARELTO) 15 MG TABS tablet Take 1 tablet (15 mg total) by mouth daily with supper. 30 tablet 1   spironolactone (ALDACTONE) 25 MG tablet TAKE 1 TABLET (25 MG TOTAL) BY MOUTH DAILY. 90 tablet 3   No current facility-administered medications for this visit.    Family History  Problem Relation Age of Onset   Allergies Mother    Heart disease Mother    Hypertension Mother    Heart disease Father        MI    Social History   Socioeconomic History   Marital status: Married    Spouse name: Not on file   Number of children: Not on file   Years of education: Not on file   Highest education level: Not on file  Occupational History   Occupation: retired    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  Tobacco Use   Smoking status: Former    Current packs/day: 0.00    Average packs/day: 1.5 packs/day for 25.0 years (37.5 ttl pk-yrs)    Types: Cigarettes    Start date: 08/08/1960    Quit date: 06/02/1982    Years since quitting: 41.0    Passive exposure: Never   Smokeless tobacco: Never   Tobacco comments:    quit smoking 30+yrs ago  Vaping Use   Vaping status: Never Used  Substance and Sexual Activity   Alcohol use: No    Alcohol/week: 0.0 standard drinks of alcohol    Comment: occasionally    Drug use: No   Sexual activity: Yes  Other Topics Concern   Not on file  Social History Narrative   Not on file   Social Drivers of Health   Financial Resource Strain: Not on file  Food Insecurity: No Food Insecurity (01/14/2023)   Hunger Vital Sign    Worried About Running Out of Food in the Last Year: Never true    Ran Out of Food in the Last Year: Never true  Transportation Needs: No Transportation Needs (01/14/2023)   PRAPARE - Transportation  Lack of Transportation (Medical): No    Lack of Transportation (Non-Medical): No  Physical  Activity: Not on file  Stress: Not on file  Social Connections: Not on file  Intimate Partner Violence: Not At Risk (01/14/2023)   Humiliation, Afraid, Rape, and Kick questionnaire    Fear of Current or Ex-Partner: No    Emotionally Abused: No    Physically Abused: No    Sexually Abused: No     REVIEW OF SYSTEMS:   [X]  denotes positive finding, [ ]  denotes negative finding Cardiac  Comments:  Chest pain or chest pressure:    Shortness of breath upon exertion:    Short of breath when lying flat:    Irregular heart rhythm:        Vascular    Pain in calf, thigh, or hip brought on by ambulation:    Pain in feet at night that wakes you up from your sleep:     Blood clot in your veins:    Leg swelling:         Pulmonary    Oxygen at home:    Productive cough:     Wheezing:         Neurologic    Sudden weakness in arms or legs:     Sudden numbness in arms or legs:     Sudden onset of difficulty speaking or slurred speech:    Temporary loss of vision in one eye:     Problems with dizziness:         Gastrointestinal    Blood in stool:     Vomited blood:         Genitourinary    Burning when urinating:     Blood in urine:        Psychiatric    Major depression:         Hematologic    Bleeding problems:    Problems with blood clotting too easily:        Skin    Rashes or ulcers:        Constitutional    Fever or chills:      PHYSICAL EXAMINATION:  Today's Vitals   06/18/23 1430 06/18/23 1432  BP: (!) 178/69 (!) 166/74  Pulse: (!) 54   Resp: 18   Temp: 97.9 F (36.6 C)   TempSrc: Temporal   SpO2: 96%   Weight: 183 lb 8 oz (83.2 kg)   Height: 5\' 9"  (1.753 m)    Body mass index is 27.1 kg/m.   General:  WDWN in NAD; vital signs documented above Gait: Not observed HENT: WNL, normocephalic Pulmonary: normal non-labored breathing Cardiac: regular HR, without carotid bruits Skin: without rashes Vascular Exam/Pulses:  Right Left  Radial 2+ (normal)  2+ (normal)   Extremities: without open wounds; minimal BLE swelling Musculoskeletal: no muscle wasting or atrophy  Neurologic: A&O X 3; moving all extremities equally; speech is fluent/normal Psychiatric:  The pt has Normal affect.   Non-Invasive Vascular Imaging:   Carotid Duplex on 06/18/2023 Right:  occluded Left:  1-39% ICA stenosis Vertebrals:  Bilateral vertebral arteries demonstrate antegrade flow.  Subclavians: Normal flow hemodynamics were seen in bilateral subclavian arteries.   Previous Carotid duplex on 06/10/2022: Right: occluded Left:   1-39% ICA stenosis    ASSESSMENT/PLAN:: 83 y.o. male here for follow up carotid artery stenosis and is s/p left CEA for asymptomatic carotid artery stenosis on 02/17/2014 by Dr. Hart Rochester.  -duplex today reveals right ICA remains occluded and  left remains in the 1-39% range.  -discussed s/s of stroke with pt and he understands should he develop any of these sx, he will go to the nearest ER or call 911. -pt will f/u in one year with carotid duplex.  Since they live in [place], will have them f/u with Dr. Chestine Spore in the [place] office.   -pt will call sooner should he have any issues. -continue statin.  Pt is on Eliquis  Elevated BP today-he will take his blood pressure when he gets home.  Most likely elevated due to office visit.    Doreatha Massed, [facility] [phone]  Clinic MD:  Karin Lieu

## 2023-06-18 NOTE — Telephone Encounter (Signed)
Called pt and wife to schedule TIPS procedure for 07/10/23 with Dr. Milford Cage. They are going to discuss and call me back. JM

## 2023-06-19 ENCOUNTER — Other Ambulatory Visit: Payer: Self-pay | Admitting: Interventional Radiology

## 2023-06-19 ENCOUNTER — Encounter: Payer: Self-pay | Admitting: Interventional Radiology

## 2023-06-19 DIAGNOSIS — I4891 Unspecified atrial fibrillation: Secondary | ICD-10-CM | POA: Diagnosis not present

## 2023-06-19 DIAGNOSIS — K766 Portal hypertension: Secondary | ICD-10-CM

## 2023-06-19 DIAGNOSIS — J9621 Acute and chronic respiratory failure with hypoxia: Secondary | ICD-10-CM | POA: Diagnosis not present

## 2023-06-24 ENCOUNTER — Ambulatory Visit (HOSPITAL_COMMUNITY)
Admission: RE | Admit: 2023-06-24 | Discharge: 2023-06-24 | Disposition: A | Discharge status: Home / Self Care | Payer: Medicare HMO | Source: Ambulatory Visit | Attending: Gastroenterology | Admitting: Gastroenterology

## 2023-06-24 ENCOUNTER — Other Ambulatory Visit (HOSPITAL_COMMUNITY): Payer: Medicare HMO

## 2023-06-24 ENCOUNTER — Telehealth (HOSPITAL_COMMUNITY): Payer: Self-pay | Admitting: Radiology

## 2023-06-24 DIAGNOSIS — R188 Other ascites: Secondary | ICD-10-CM | POA: Insufficient documentation

## 2023-06-24 DIAGNOSIS — J90 Pleural effusion, not elsewhere classified: Secondary | ICD-10-CM | POA: Diagnosis not present

## 2023-06-24 DIAGNOSIS — K7689 Other specified diseases of liver: Secondary | ICD-10-CM | POA: Diagnosis not present

## 2023-06-24 DIAGNOSIS — K7460 Unspecified cirrhosis of liver: Secondary | ICD-10-CM | POA: Insufficient documentation

## 2023-06-24 NOTE — Telephone Encounter (Signed)
Called and spoke with Caleb Wolf (pt's spouse) to schedule TIPS on 07/10/23. Pt's wife states that they would like to wait to schedule this after they have a phone visit with Dr. Milford Cage tomorrow, 06/25/23. She also states that do not want to do this on a Friday but preferably a day earlier in the week. I have sent a request to the physician schedulers to see when Dr. Milford Cage is available and will call the patient and wife back once I have some other dates to offer them. They are in agreement with this plan of care. JM

## 2023-06-25 ENCOUNTER — Ambulatory Visit
Admission: RE | Admit: 2023-06-25 | Discharge: 2023-06-25 | Disposition: A | Discharge status: Home / Self Care | Payer: Medicare HMO | Source: Ambulatory Visit | Attending: Interventional Radiology | Admitting: Interventional Radiology

## 2023-06-25 ENCOUNTER — Other Ambulatory Visit (INDEPENDENT_AMBULATORY_CARE_PROVIDER_SITE_OTHER): Payer: Self-pay | Admitting: Gastroenterology

## 2023-06-25 DIAGNOSIS — K766 Portal hypertension: Secondary | ICD-10-CM

## 2023-06-25 DIAGNOSIS — J90 Pleural effusion, not elsewhere classified: Secondary | ICD-10-CM

## 2023-06-25 HISTORY — PX: IR RADIOLOGIST EVAL & MGMT: IMG5224

## 2023-06-25 NOTE — Progress Notes (Signed)
Reason for visit: Cirrhosis. TIPS re-evaluation.   Care Team: Primary Care; Carylon Perches, MD Gastroenterology;  Dolores Frame, MD and Raquel James, NP Pulmonary; Waymon Budge, MD  Cardiology; Jake Bathe, MD    Virtual Visit via Telephone Note   I connected with [name] on 06/25/23 by telephone and verified that I am speaking with the correct person using two identifiers. I discussed the limitations, risks, security and privacy concerns of performing an evaluation and management service by telephone and the availability of in-person appointments.  History of present illness:  [name] is a 83 y.o. male comorbid including a PMHx significant for Afib s/p ablation x2, CAD s/p CABG and CHF w EF ~45%. Pt reported remote EtOH Hx, quit 20 yrs prior however noted increasing abdominal distention with ascitic decompensation in 11/2022. Pt is followed closely by his Gastroenterology ([facility]) team in [place] and was initially referred for TIPS then seen by me on 12/01/22. He unfortunately had inpatient hospitalizations x2 for SBP workup and fall, with acute worsening of his liver function and deferring his TIPS. His ascites has been managed by serial paracenteses, most recently 4.0L drawn on 06/10/23. He was recently seen back by his GI team and was notably improved, leading to re-referral for TIPS consideration.  He is joined in the visit by his spouse, Caleb Wolf, who is a reliable historian and reports that [name] has intermittent confusion and that his encephalopathy is well regulated by his lactulose. They deny any thoracenteses, anemia or bleeding episodes.   Review of Systems: A 12-point ROS discussed, and pertinent positives are indicated in the HPI above.  All other systems are negative.   Past Medical History:  Diagnosis Date   AAA (abdominal aortic aneurysm) (HCC)    Arthritis    Basal cell carcinoma    Carotid stenosis    Chronic kidney  disease, stage 3b (HCC) 12/13/2021   Coronary artery disease    Multvessel s/p CABG 2015   Enlarged prostate    Essential hypertension    GERD (gastroesophageal reflux disease)    Gout    History of colon polyps    History of kidney stones    Hyperlipidemia    Leukocytosis 01/21/2023   Lumbar disc disease    Persistent atrial fibrillation (HCC)    Sleep apnea    CPAP    Past Surgical History:  Procedure Laterality Date   ABLATION OF DYSRHYTHMIC FOCUS  07/28/2017   ATRIAL FIBRILLATION ABLATION N/A 07/28/2017   Procedure: ATRIAL FIBRILLATION ABLATION;  Surgeon: Hillis Range, MD;  Location: [facility] INVASIVE CV LAB;  Service: Cardiovascular;  Laterality: N/A;   ATRIAL FIBRILLATION ABLATION N/A 12/04/2020   Procedure: ATRIAL FIBRILLATION ABLATION;  Surgeon: Hillis Range, MD;  Location: [facility] INVASIVE CV LAB;  Service: Cardiovascular;  Laterality: N/A;   BACK SURGERY     CARDIAC CATHETERIZATION  05/22/2014   Procedure: IABP INSERTION;  Surgeon: Marykay Lex, MD;  Location: [facility] CATH LAB;  Service: Cardiovascular;;   CARDIOVERSION N/A 04/29/2017   Procedure: CARDIOVERSION;  Surgeon: Jonelle Sidle, MD;  Location: [facility] ENDO SUITE;  Service: Cardiovascular;  Laterality: N/A;   CARDIOVERSION N/A 08/13/2017   Procedure: CARDIOVERSION;  Surgeon: Chrystie Nose, MD;  Location: [facility] ENDOSCOPY;  Service: Cardiovascular;  Laterality: N/A;   CARDIOVERSION N/A 08/15/2019   Procedure: CARDIOVERSION;  Surgeon: Lars Masson, MD;  Location: [facility] ENDOSCOPY;  Service: Cardiovascular;  Laterality: N/A;   CARDIOVERSION N/A 03/09/2020  Procedure: CARDIOVERSION;  Surgeon: Jodelle Red, MD;  Location: [facility] ENDOSCOPY;  Service: Cardiovascular;  Laterality: N/A;   CARDIOVERSION N/A 07/06/2020   Procedure: CARDIOVERSION;  Surgeon: Thurmon Fair, MD;  Location: [facility] ENDOSCOPY;  Service: Cardiovascular;  Laterality: N/A;   CARDIOVERSION N/A 01/18/2021   Procedure: CARDIOVERSION;  Surgeon: Chilton Si, MD;   Location: [facility] ENDOSCOPY;  Service: Cardiovascular;  Laterality: N/A;   CARDIOVERSION N/A 06/17/2021   Procedure: CARDIOVERSION;  Surgeon: Sande Rives, MD;  Location: [facility] ENDOSCOPY;  Service: Cardiovascular;  Laterality: N/A;   Cataract surgery Right    COLONOSCOPY     COLONOSCOPY N/A 12/30/2017   Procedure: COLONOSCOPY;  Surgeon: Malissa Hippo, MD;  [facility]: [facility];  Service: Endoscopy;  Laterality: N/A;   CORONARY ARTERY BYPASS GRAFT N/A 05/22/2014   Procedure: CORONARY ARTERY BYPASS GRAFTING (CABG) times three using left internal mammary and right saphenous vein.;  Surgeon: Loreli Slot, MD;  Location: [facility] OR;  Service: Open Heart Surgery;  Laterality: N/A;   ENDARTERECTOMY Left 02/17/2014   Procedure: ENDARTERECTOMY CAROTID WITH PATCH ANGIOPLASTY;  Surgeon: Pryor Ochoa, MD;  Location: [facility] OR;  Service: Vascular;  Laterality: Left;   ESOPHAGEAL DILATION N/A 08/22/2015   Procedure: ESOPHAGEAL DILATION;  Surgeon: Malissa Hippo, MD;  [facility]: [facility];  Service: Endoscopy;  Laterality: N/A;   ESOPHAGOGASTRODUODENOSCOPY N/A 08/22/2015   Procedure: ESOPHAGOGASTRODUODENOSCOPY (EGD);  Surgeon: Malissa Hippo, MD;  Location: [facility];  Service: Endoscopy;  Laterality: N/A;  12:45 - moved to 1:55 - Ann notified pt   ESOPHAGOGASTRODUODENOSCOPY N/A 12/30/2017   Procedure: ESOPHAGOGASTRODUODENOSCOPY (EGD);  Surgeon: Malissa Hippo, MD;  Location: [facility];  Service: Endoscopy;  Laterality: N/A;  200   EYE SURGERY     cataract extraction (right) with repair macular tear , with IOL     right   FRACTURE SURGERY     bilateral wrist fractures- one ORIF   IR RADIOLOGIST EVAL & MGMT  12/01/2022   JOINT REPLACEMENT  2011   left knee   LEFT HEART CATHETERIZATION WITH CORONARY ANGIOGRAM N/A 05/22/2014   Procedure: LEFT HEART CATHETERIZATION WITH CORONARY ANGIOGRAM;  Surgeon: Marykay Lex, MD;  Location: [facility] CATH LAB;  Service: Cardiovascular;  Laterality: N/A;    LITHOTRIPSY     PARS PLANA VITRECTOMY W/ REPAIR OF MACULAR HOLE     RHINOPLASTY     TEE WITHOUT CARDIOVERSION N/A 04/29/2017   Procedure: TRANSESOPHAGEAL ECHOCARDIOGRAM (TEE) WITH PROPOFOL;  Surgeon: Jonelle Sidle, MD;  Location: [facility];  Service: Cardiovascular;  Laterality: N/A;   TONSILLECTOMY     TOTAL HIP ARTHROPLASTY  12/08/2011   Procedure: TOTAL HIP ARTHROPLASTY;  Surgeon: Loanne Drilling, MD;  Location: [facility] ORS;  Service: Orthopedics;  Laterality: Right;   UPPER GI ENDOSCOPY  12/18/2015   Procedure: UPPER GI ENDOSCOPY;  Surgeon: Axel Filler, MD;  Location: [facility] ORS;  Service: General;;   WRIST SURGERY Right 68yrs ago   WRIST SURGERY Left     Allergies: Codeine sulfate  Medications: Prior to Admission medications   Medication Sig Start Date End Date Taking? Authorizing Provider  acetaminophen (TYLENOL) 500 MG tablet Take 1,000 mg by mouth every 6 (six) hours as needed for moderate pain.    [provider]  amiodarone (PACERONE) 200 MG tablet TAKE 1 TABLET BY MOUTH EVERY DAY 04/17/23   Jake Bathe, MD  atorvastatin (LIPITOR) 80 MG tablet TAKE 1 TABLET BY MOUTH EVERY DAY IN THE EVENING 04/17/23  Jake Bathe, MD  Cholecalciferol (VITAMIN D) 50 MCG (2000 UT) CAPS Take 2,000 Units by mouth daily.    [provider]  cyanocobalamin (VITAMIN B12) 1000 MCG tablet Take 1,000 mcg by mouth See admin instructions. 2-3 TIMES WEEKLY PER PT'S WIFE    [provider]  doxazosin (CARDURA) 4 MG tablet Take 4 mg by mouth in the morning. 07/06/17   [provider]  empagliflozin (JARDIANCE) 10 MG TABS tablet TAKE 1 TABLET BY MOUTH DAILY BEFORE BREAKFAST. 06/15/23   Jake Bathe, MD  feeding supplement (BOOST HIGH PROTEIN) LIQD Take 1 Container by mouth at bedtime. One shake before bedtime.    [provider]  finasteride (PROSCAR) 5 MG tablet Take 5 mg by mouth every evening.     [provider]  fluticasone (FLONASE) 50 MCG/ACT  nasal spray Place 1 spray into both nostrils daily as needed for allergies or rhinitis.    [provider]  furosemide (LASIX) 80 MG tablet TAKE 1 TABLET BY MOUTH 2 TIMES DAILY. 06/04/23   Jake Bathe, MD  lactulose (CHRONULAC) 10 GM/15ML solution Take 67.5 mLs (45 g total) by mouth 3 (three) times daily. Titrate dosage of lactulose to 2-3 soft bowels per day 03/17/23   Carlan, Leeroy Bock L, NP  nitroGLYCERIN (NITROSTAT) 0.4 MG SL tablet Place 1 tablet (0.4 mg total) under the tongue every 5 (five) minutes x 3 doses as needed for chest pain. 06/04/20   Allred, Fayrene Fearing, MD  oxyCODONE-acetaminophen (PERCOCET/ROXICET) 5-325 MG tablet Take 1 tablet by mouth every 6 (six) hours as needed for severe pain. 01/25/23   Gilda Crease, MD  pantoprazole (PROTONIX) 40 MG tablet Take 1 tablet (40 mg total) by mouth daily before breakfast. 02/19/16   Rehman, Joline Maxcy, MD  Rivaroxaban (XARELTO) 15 MG TABS tablet Take 1 tablet (15 mg total) by mouth daily with supper. 12/16/21   Catarina Hartshorn, MD  spironolactone (ALDACTONE) 25 MG tablet TAKE 1 TABLET (25 MG TOTAL) BY MOUTH DAILY. 05/05/23   Raquel James, NP     Family History  Problem Relation Age of Onset   Allergies Mother    Heart disease Mother    Hypertension Mother    Heart disease Father        MI    Social History   Socioeconomic History   Marital status: Married    Spouse name: Not on file   Number of children: Not on file   Years of education: Not on file   Highest education level: Not on file  Occupational History   Occupation: retired    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  Tobacco Use   Smoking status: Former    Current packs/day: 0.00    Average packs/day: 1.5 packs/day for 25.0 years (37.5 ttl pk-yrs)    Types: Cigarettes    Start date: 08/08/1960    Quit date: 06/02/1982    Years since quitting: 41.0    Passive exposure: Never   Smokeless tobacco: Never   Tobacco comments:    quit smoking 30+yrs ago  Vaping Use   Vaping  status: Never Used  Substance and Sexual Activity   Alcohol use: No    Alcohol/week: 0.0 standard drinks of alcohol    Comment: occasionally    Drug use: No   Sexual activity: Yes  Other Topics Concern   Not on file  Social History Narrative   Not on file   Social Drivers of Health   Financial Resource Strain:  Not on file  Food Insecurity: No Food Insecurity (01/14/2023)   Hunger Vital Sign    Worried About Running Out of Food in the Last Year: Never true    Ran Out of Food in the Last Year: Never true  Transportation Needs: No Transportation Needs (01/14/2023)   PRAPARE - Administrator, Civil Service (Medical): No    Lack of Transportation (Non-Medical): No  Physical Activity: Not on file  Stress: Not on file  Social Connections: Not on file     Vital Signs: There were no vitals taken for this visit.  Physical Exam Deferred secondary to virtual visit.  Imaging:  Independently reviewed, demonstrating anatomy amenable for RHV to RPV TIPS approach.    US Abdomen Limited RUQ (LIVER/GB) Result Date: 06/24/2023 CLINICAL DATA:  Cirrhosis EXAM: ULTRASOUND ABDOMEN LIMITED RIGHT UPPER QUADRANT COMPARISON:  CT abdomen pelvis 12/24/2022 FINDINGS: Gallbladder: No gallstones or wall thickening visualized. No sonographic Murphy sign noted by sonographer. Common bile duct: Diameter: 3.1 mm Liver: Coarsened echogenicity and nodular contour. No focal lesion. Portal vein is patent on color Doppler imaging with normal direction of blood flow towards the liver. Other: Moderate volume ascites.  Right pleural effusion. IMPRESSION: 1. Cirrhotic morphology of the liver. No focal lesion. 2. Moderate volume ascites. 3. Right pleural effusion. Electronically Signed   By: Annia Belt [name]   On: 06/24/2023 11:31    Labs:  CBC: Recent Labs    01/17/23 0452 01/24/23 2259 03/18/23 1251 06/17/23 1429  WBC 11.0* 8.5 6.1 6.5  HGB 8.8* 9.5* 11.6* 11.4*  HCT 28.6* 30.0* 37.6* 36.6*  PLT  289 287 259 243    COAGS: Recent Labs    12/27/22 0544 01/16/23 0410 03/18/23 1251 06/17/23 1429  INR 2.2* 2.3* 2.3* 1.2    BMP: Recent Labs    01/16/23 0410 01/17/23 0452 01/19/23 1612 01/24/23 2259 03/18/23 1251 06/17/23 1429  NA 135 135 139 131* 139 143  K 4.4 3.6 4.7 3.6 4.8 4.4  CL 99 102 99 97* 98 101  CO2 28 24 26 24 31 26   GLUCOSE 104* 94 97 106* 106* 83  BUN 35* 31* 35* 49* 24* 27  CALCIUM 8.2* 8.0* 8.7 7.8* 8.7* 8.6  CREATININE 2.07* 1.75* 1.92* 2.34* 1.75* 1.92*  GFRNONAA 32* 39*  --  27* 39*  --     LIVER FUNCTION TESTS: Recent Labs    01/19/23 1612 01/24/23 2259 03/18/23 1251 06/17/23 1429  BILITOT 0.2 0.3 0.5 0.2  AST 22 23 28 16   ALT 28 29 31 20   ALKPHOS 87 72 83 127*  PROT 5.9* 5.6* 6.1* 5.6*  ALBUMIN 4.1 2.9* 2.9* 3.5*   MELD 3.0: 15 at 06/17/2023  2:29 PM MELD-Na: 15 at 06/17/2023  2:29 PM Calculated from: Serum Creatinine: 1.92 mg/dL at 1/61/0960  4:54 PM Serum Sodium: 143 mmol/L (Using max of 137 mmol/L) at 06/17/2023  2:29 PM Total Bilirubin: 0.2 mg/dL (Using min of 1 mg/dL) at 0/98/1191  4:78 PM Serum Albumin: 3.5 g/dL at 2/95/6213  0:86 PM INR(ratio): 1.2 at 06/17/2023  2:29 PM Age at listing (hypothetical): 82 years Sex: Male at 06/17/2023  2:29 PM   Assessment and Plan:  83 y/o M comorbid w PMHx significant for CAD s/p CABG, CHF w EF ~45% and Afib s/p ablation x2. Remote EtOH Hx, quit 20 yrs prior, decompensated with refractory ascites requring serial paracenteses.   MELD 15 (06/17/23). Child-Pugh class B, 9 pts - moderate hepatic dysfunction. [place] Index of  Post-TIPS Survival (FIPS) = -0.18. Favorable. 1 month / 96.2%, 3 Months / 87.7%, 6 Months / 82.0%.  TTE last 7/113/24 w normal RV function Hepatic encephalopathy, subjective grading as described, Grade 1 (intermittent confusion)   Current pharmacologic encephalopathy prophylaxis/treatment: lactulose TID, Poor compliance reported, 1 BM/day. Current diuretic regimen:  furosemide 80 mg BID and spirinolactone 25 mg qD   Risks and benefits of transjugular intrahepatic portosystemic shunt (TIPS) were discussed with the patient and/or the patient's family including, but not limited to, infection, bleeding, damage to adjacent structures, worsening hepatic and/or cardiac function, worsening and/or the development of altered mental status/encephalopathy and death.   The procedure has been fully reviewed with the patient/patient's authorized representative. They have consented to the procedure.   *Repeat CT BRTO protocol requested. Scheduled for 06/26/23. *TTE and Cardiac clearance completed in 2024. No repeat clearance required. *Pre Anesthesia testing (PAT) *OK to proceed to schedule TIPS + concurrent Paracentesis  *request new MELD labs (CBC, CMP, Coags) and Ammonia on procedure day *Admit to [facility] secondary to comorbidities on TIPS admission.   Electronically Signed:  Roanna Banning, MD [facility] Rad[facility]   Pager. [phone] Clinic. [phone]  I spent a total of 25 Minutes of non-face-to-face time in clinical consultation, greater than 50% of which was counseling/coordinating care for [name]'s cirrhosis and portal HTN

## 2023-06-26 ENCOUNTER — Ambulatory Visit
Admission: RE | Admit: 2023-06-26 | Discharge: 2023-06-26 | Disposition: A | Discharge status: Home / Self Care | Payer: Medicare HMO | Source: Ambulatory Visit | Attending: Interventional Radiology | Admitting: Interventional Radiology

## 2023-06-26 DIAGNOSIS — K7460 Unspecified cirrhosis of liver: Secondary | ICD-10-CM | POA: Diagnosis not present

## 2023-06-26 DIAGNOSIS — K766 Portal hypertension: Secondary | ICD-10-CM

## 2023-06-26 DIAGNOSIS — R188 Other ascites: Secondary | ICD-10-CM | POA: Diagnosis not present

## 2023-06-26 DIAGNOSIS — K5730 Diverticulosis of large intestine without perforation or abscess without bleeding: Secondary | ICD-10-CM | POA: Diagnosis not present

## 2023-06-26 MED ORDER — IOPAMIDOL (ISOVUE-300) INJECTION 61%
75.0000 mL | Freq: Once | INTRAVENOUS | Status: AC | PRN
  Administered 2023-06-26: 75 mL via INTRAVENOUS

## 2023-07-01 ENCOUNTER — Other Ambulatory Visit (INDEPENDENT_AMBULATORY_CARE_PROVIDER_SITE_OTHER): Payer: Self-pay | Admitting: Gastroenterology

## 2023-07-01 ENCOUNTER — Encounter (INDEPENDENT_AMBULATORY_CARE_PROVIDER_SITE_OTHER): Payer: Self-pay

## 2023-07-01 ENCOUNTER — Ambulatory Visit (HOSPITAL_COMMUNITY)
Admission: RE | Admit: 2023-07-01 | Discharge: 2023-07-01 | Disposition: A | Discharge status: Home / Self Care | Payer: Medicare HMO | Source: Ambulatory Visit | Attending: Gastroenterology | Admitting: Gastroenterology

## 2023-07-01 ENCOUNTER — Encounter (HOSPITAL_COMMUNITY): Payer: Self-pay

## 2023-07-01 VITALS — BP 128/80 | HR 52 | Temp 98.0°F | Resp 18

## 2023-07-01 DIAGNOSIS — K7460 Unspecified cirrhosis of liver: Secondary | ICD-10-CM | POA: Diagnosis not present

## 2023-07-01 DIAGNOSIS — R188 Other ascites: Secondary | ICD-10-CM | POA: Diagnosis not present

## 2023-07-01 LAB — BODY FLUID CELL COUNT WITH DIFFERENTIAL
Eos, Fluid: 0 %
Lymphs, Fluid: 60 %
Monocyte-Macrophage-Serous Fluid: 27 % — ABNORMAL LOW (ref 50–90)
Neutrophil Count, Fluid: 13 % (ref 0–25)
Total Nucleated Cell Count, Fluid: 1233 uL — ABNORMAL HIGH (ref 0–1000)

## 2023-07-01 LAB — GRAM STAIN

## 2023-07-01 MED ORDER — LIDOCAINE HCL (PF) 2 % IJ SOLN
10.0000 mL | Freq: Once | INTRAMUSCULAR | Status: AC
  Administered 2023-07-01: 10 mL

## 2023-07-01 MED ORDER — LIDOCAINE HCL (PF) 2 % IJ SOLN
INTRAMUSCULAR | Status: AC
  Filled 2023-07-01: qty 10

## 2023-07-01 NOTE — Progress Notes (Signed)
Patient tolerated right sided paracentesis procedure well today and 1.3 Liters of cloudy yellow fluid removed with labs collected and sent for processing. Patient verbalized understanding of discharge instructions and left via wheelchair with wife with no acute distress noted.

## 2023-07-02 ENCOUNTER — Ambulatory Visit (HOSPITAL_COMMUNITY)
Admission: RE | Admit: 2023-07-02 | Discharge: 2023-07-02 | Disposition: A | Discharge status: Home / Self Care | Payer: Medicare HMO | Source: Ambulatory Visit | Attending: Gastroenterology | Admitting: Gastroenterology

## 2023-07-02 DIAGNOSIS — J90 Pleural effusion, not elsewhere classified: Secondary | ICD-10-CM

## 2023-07-06 LAB — CULTURE, BODY FLUID W GRAM STAIN -BOTTLE
Culture: NO GROWTH
Special Requests: ADEQUATE

## 2023-07-06 LAB — CYTOLOGY - NON PAP

## 2023-07-07 DIAGNOSIS — G4733 Obstructive sleep apnea (adult) (pediatric): Secondary | ICD-10-CM | POA: Diagnosis not present

## 2023-07-10 ENCOUNTER — Other Ambulatory Visit: Payer: Self-pay | Admitting: Physician Assistant

## 2023-07-10 ENCOUNTER — Other Ambulatory Visit: Payer: Self-pay

## 2023-07-10 ENCOUNTER — Encounter (HOSPITAL_COMMUNITY): Payer: Self-pay | Admitting: Interventional Radiology

## 2023-07-10 DIAGNOSIS — Z01818 Encounter for other preprocedural examination: Secondary | ICD-10-CM

## 2023-07-10 NOTE — H&P (Addendum)
 Chief Complaint: Decompensated cirrhosis with recurrent ascites. Patient presents for TIPS and paracentesis  Referring Physician(s): Primary Care; Artemisa Bile, MD Gastroenterology;  Urban Garden, MD and Patterson Bora, NP  Supervising Physician: Art Largo  Patient Status: [facility] - Out-pt  History of Present Illness: Caleb Wolf is a 83 y.o. male. Male outpatient. History of a fib s/p ablation , HLD, GERD, HTN, CAD s/p CABG, CHF,  CKD, AAA, carotid stenosis. Former remote drinker with decompensated cirrhosis with recurrent ascites and hepatic encephalopathy controlled by lactulose . The Patient along with his wife was seen for consultation in the [facility] via telephone on 1.23.25 with IR Attending Dr. Art Largo. The Patient's clinical picture was discussed. After frank evaluation regarding treatment options and possible outcomes the patent decided to proceed with transjugular, transhepatic portal venogram with possible portal vein angioplasty, possible portal vein stent placement, variceal embolization in addition to splenorenal shunt coil embolization. Patient presents for TIPS with anesthesia.   CT Abd pelvis from 2.1.25 reads IMPRESSION: VASCULAR 1. Patent portal system without evidence of gastroesophageal varices or portosystemic shunts. 2.  Aortic Atherosclerosis (ICD10-I70.0).   NON-VASCULAR 1. Similar appearing morphologic changes of cirrhosis and portal hypertension is evidence by small volume ascites and trace right hepatic hydrothorax. 2. Age indeterminate however new from comparison L1 anterior wedge compression deformity. 3. Diverticulosis.   Currently without any significant complaints. Patient alert and laying in bed,calm. Denies any fevers, headache, chest pain, SOB, cough, abdominal pain, nausea, vomiting or bleeding.   Labs pending. MELD 13, Child-Pugh class B. Last paracentesis perfomed om 1.29.25 with 1.3. Last dose of   xarelto  o 2.4.24. Last dose of ASA All medications are within acceptable parameters. Allergies include codeine. Patient has been NPO since midnight.  Return precautions and treatment recommendations and follow-up discussed with the patient and his wife. Both who are agreeable with the plan.   Past Medical History:  Diagnosis Date   AAA (abdominal aortic aneurysm) (HCC)    Arthritis    Basal cell carcinoma    BOOP (bronchiolitis obliterans with organizing pneumonia) (HCC) 2023   Carotid stenosis    Chronic kidney disease, stage 3b (HCC) 12/13/2021   Cirrhosis of liver not due to alcohol (HCC)    Coronary artery disease    Multvessel s/p CABG 2015   Enlarged prostate    Essential hypertension    GERD (gastroesophageal reflux disease)    Gout    History of colon polyps    History of hiatal hernia    History of kidney stones    Hyperlipidemia    Leukocytosis 01/21/2023   Lumbar disc disease    Myocardial infarction (HCC) 2015   Oxygen  dependent    at bedtime- 2L   Persistent atrial fibrillation (HCC)    8 cardioversions and 2 ablations   Sleep apnea    CPAP   Spinal stenosis     Past Surgical History:  Procedure Laterality Date   ABLATION OF DYSRHYTHMIC FOCUS  07/28/2017   ATRIAL FIBRILLATION ABLATION N/A 07/28/2017   Procedure: ATRIAL FIBRILLATION ABLATION;  Surgeon: Jolly Needle, MD;  Location: [facility] CV LAB;  Service: Cardiovascular;  Laterality: N/A;   ATRIAL FIBRILLATION ABLATION N/A 12/04/2020   Procedure: ATRIAL FIBRILLATION ABLATION;  Surgeon: Jolly Needle, MD;  Location: [facility] INVASIVE CV LAB;  Service: Cardiovascular;  Laterality: N/A;   BACK SURGERY     CARDIAC CATHETERIZATION  05/22/2014   Procedure: IABP INSERTION;  Surgeon: Arleen Lacer, MD;  Location: [facility]  CATH LAB;  Service: Cardiovascular;;   CARDIOVERSION N/A 04/29/2017   Procedure: CARDIOVERSION;  Surgeon: Gerard Knight, MD;  Location: [facility] ENDO SUITE;  Service: Cardiovascular;  Laterality: N/A;    CARDIOVERSION N/A 08/13/2017   Procedure: CARDIOVERSION;  Surgeon: Hazle Lites, MD;  Location: [facility] ENDOSCOPY;  Service: Cardiovascular;  Laterality: N/A;   CARDIOVERSION N/A 08/15/2019   Procedure: CARDIOVERSION;  Surgeon: Liza Riggers, MD;  Location: [facility] ENDOSCOPY;  Service: Cardiovascular;  Laterality: N/A;   CARDIOVERSION N/A 03/09/2020   Procedure: CARDIOVERSION;  Surgeon: Sheryle Donning, MD;  Location: [facility] ENDOSCOPY;  Service: Cardiovascular;  Laterality: N/A;   CARDIOVERSION N/A 07/06/2020   Procedure: CARDIOVERSION;  Surgeon: Luana Rumple, MD;  Location: [facility] ENDOSCOPY;  Service: Cardiovascular;  Laterality: N/A;   CARDIOVERSION N/A 01/18/2021   Procedure: CARDIOVERSION;  Surgeon: Maudine Sos, MD;  Location: [facility] ENDOSCOPY;  Service: Cardiovascular;  Laterality: N/A;   CARDIOVERSION N/A 06/17/2021   Procedure: CARDIOVERSION;  Surgeon: Harrold Lincoln, MD;  Location: [facility] ENDOSCOPY;  Service: Cardiovascular;  Laterality: N/A;   Cataract surgery Bilateral    COLONOSCOPY     COLONOSCOPY N/A 12/30/2017   Procedure: COLONOSCOPY;  Surgeon: Ruby Corporal, MD;  [facility];  Service: Endoscopy;  Laterality: N/A;   CORONARY ARTERY BYPASS GRAFT N/A 05/22/2014   Procedure: CORONARY ARTERY BYPASS GRAFTING (CABG) times three using left internal mammary and right saphenous vein.;  Surgeon: Zelphia Higashi, MD;  Location: [facility] OR;  Service: Open Heart Surgery;  Laterality: N/A;   ENDARTERECTOMY Left 02/17/2014   Procedure: ENDARTERECTOMY CAROTID WITH PATCH ANGIOPLASTY;  Surgeon: Palma Bob, MD;  Location: [facility] OR;  Service: Vascular;  Laterality: Left;   ESOPHAGEAL DILATION N/A 08/22/2015   Procedure: ESOPHAGEAL DILATION;  Surgeon: Ruby Corporal, MD;  [facility];  Service: Endoscopy;  Laterality: N/A;   ESOPHAGOGASTRODUODENOSCOPY N/A 08/22/2015   Procedure: ESOPHAGOGASTRODUODENOSCOPY (EGD);  Surgeon: Ruby Corporal, MD;  [facility]: [facility] ENDO  SU[facility];  Service: Endoscopy;  Laterality: N/A;  12:45 - moved to 1:55 - Ann notified pt   ESOPHAGOGASTRODUODENOSCOPY N/A 12/30/2017   Procedure: ESOPHAGOGASTRODUODENOSCOPY (EGD);  Surgeon: Ruby Corporal, MD;  [facility]: [facility];  Service: Endoscopy;  Laterality: N/A;  200   EYE SURGERY     cataract extraction (right) with repair macular tear , with IOL     right   FRACTURE SURGERY     bilateral wrist fractures- one ORIF   IR RADIOLOGIST EVAL & MGMT  12/01/2022   IR RADIOLOGIST EVAL & MGMT  06/25/2023   JOINT REPLACEMENT  2011   left knee   LEFT HEART CATHETERIZATION WITH CORONARY ANGIOGRAM N/A 05/22/2014   Procedure: LEFT HEART CATHETERIZATION WITH CORONARY ANGIOGRAM;  Surgeon: Arleen Lacer, MD;  Location: [facility] CATH LAB;  Service: Cardiovascular;  Laterality: N/A;   LITHOTRIPSY     PARS PLANA VITRECTOMY W/ REPAIR OF MACULAR HOLE     RHINOPLASTY     TEE WITHOUT CARDIOVERSION N/A 04/29/2017   Procedure: TRANSESOPHAGEAL ECHOCARDIOGRAM (TEE) WITH PROPOFOL ;  Surgeon: Gerard Knight, MD;  Location: [facility]O [facility];  Service: Cardiovascular;  Laterality: N/A;   TONSILLECTOMY     TOTAL HIP ARTHROPLASTY  12/08/2011   Procedure: TOTAL HIP ARTHROPLASTY;  Surgeon: Aurther Blue, MD;  Location: [facility] ORS;  Service: Orthopedics;  Laterality: Right;   UPPER GI ENDOSCOPY  12/18/2015   Procedure: UPPER GI ENDOSCOPY;  Surgeon: Shela Derby, MD;  Location: [facility] ORS;  Service: General;;   WRIST  SURGERY Right 41yrs ago   WRIST SURGERY Left     Allergies: Codeine sulfate  Medications: Prior to Admission medications   Medication Sig Start Date End Date Taking? Authorizing Provider  acetaminophen  (TYLENOL ) 500 MG tablet Take 1,000 mg by mouth every 6 (six) hours as needed for moderate pain.    [provider]  amiodarone  (PACERONE ) 200 MG tablet TAKE 1 TABLET BY MOUTH EVERY DAY 04/17/23   Hugh Madura, MD  atorvastatin  (LIPITOR) 80 MG tablet TAKE 1 TABLET BY MOUTH EVERY DAY IN THE  EVENING 04/17/23   Hugh Madura, MD  Cholecalciferol  (VITAMIN D ) 50 MCG (2000 UT) CAPS Take 2,000 Units by mouth daily.    [provider]  cyanocobalamin  (VITAMIN B12) 1000 MCG tablet Take 1,000 mcg by mouth 2 (two) times a week.    [provider]  doxazosin  (CARDURA ) 4 MG tablet Take 4 mg by mouth in the morning. 07/06/17   [provider]  empagliflozin  (JARDIANCE ) 10 MG TABS tablet TAKE 1 TABLET BY MOUTH DAILY BEFORE BREAKFAST. 06/15/23   Hugh Madura, MD  feeding supplement (BOOST HIGH PROTEIN) LIQD Take 1 Container by mouth daily in the afternoon.    [provider]  finasteride  (PROSCAR ) 5 MG tablet Take 5 mg by mouth every evening.     [provider]  fluticasone  (FLONASE ) 50 MCG/ACT nasal spray Place 1 spray into both nostrils daily as needed for allergies or rhinitis.    [provider]  furosemide  (LASIX ) 80 MG tablet TAKE 1 TABLET BY MOUTH 2 TIMES DAILY. 06/04/23   Hugh Madura, MD  lactulose  (CHRONULAC ) 10 GM/15ML solution Take 67.5 mLs (45 g total) by mouth 3 (three) times daily. Titrate dosage of lactulose  to 2-3 soft bowels per day Patient taking differently: Take 40 g by mouth 2 (two) times daily. 03/17/23   Carlan, Chelsea L, NP  nitroGLYCERIN  (NITROSTAT ) 0.4 MG SL tablet Place 1 tablet (0.4 mg total) under the tongue every 5 (five) minutes x 3 doses as needed for chest pain. 06/04/20   Allred, Royston Cornea, MD  pantoprazole  (PROTONIX ) 40 MG tablet Take 1 tablet (40 mg total) by mouth daily before breakfast. 02/19/16   Rehman, Mathews Solomons, MD  Rivaroxaban  (XARELTO ) 15 MG TABS tablet Take 1 tablet (15 mg total) by mouth daily with supper. 12/16/21   Demaris Fillers, MD  spironolactone  (ALDACTONE ) 25 MG tablet TAKE 1 TABLET (25 MG TOTAL) BY MOUTH DAILY. 05/05/23   Carlan, Chelsea L, NP  tiZANidine  (ZANAFLEX ) 2 MG tablet Take 2 mg by mouth 2 (two) times daily as needed for muscle spasms. 07/04/23   [provider]     Family History   Problem Relation Age of Onset   Allergies Mother    Heart disease Mother    Hypertension Mother    Heart disease Father        MI    Social History   Socioeconomic History   Marital status: Married    Spouse name: Not on file   Number of children: Not on file   Years of education: Not on file   Highest education level: Not on file  Occupational History   Occupation: retired    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  Tobacco Use   Smoking status: Former    Current packs/day: 0.00    Average packs/day: 1.5 packs/day for 25.0 years (37.5 ttl pk-yrs)    Types: Cigarettes    Start date: 08/08/1960    Quit date:  06/02/1982    Years since quitting: 41.1    Passive exposure: Never   Smokeless tobacco: Never   Tobacco comments:    quit smoking 30+yrs ago  Vaping Use   Vaping status: Never Used  Substance and Sexual Activity   Alcohol use: No    Alcohol/week: 0.0 standard drinks of alcohol    Comment: occasionally    Drug use: No   Sexual activity: Yes  Other Topics Concern   Not on file  Social History Narrative   Not on file   Social Drivers of Health   Financial Resource Strain: Not on file  Food Insecurity: No Food Insecurity (01/14/2023)   Hunger Vital Sign    Worried About Running Out of Food in the Last Year: Never true    Ran Out of Food in the Last Year: Never true  Transportation Needs: No Transportation Needs (01/14/2023)   PRAPARE - Administrator, Civil Service (Medical): No    Lack of Transportation (Non-Medical): No  Physical Activity: Not on file  Stress: Not on file  Social Connections: Not on file     Review of Systems: A 12 point ROS discussed and pertinent positives are indicated in the HPI above.  All other systems are negative.  Review of Systems  Constitutional:  Negative for fever.  HENT:  Negative for congestion.   Respiratory:  Negative for cough and shortness of breath.   Cardiovascular:  Negative for chest pain.   Gastrointestinal:  Negative for abdominal pain.  Neurological:  Negative for headaches.  Psychiatric/Behavioral:  Negative for behavioral problems and confusion.     Vital Signs: BP (!) 149/57   Pulse (!) 55   Temp 98.1 F (36.7 C) (Oral)   Resp 18   Ht 5\' 9"  (1.753 m)   Wt 198 lb (89.8 kg)   SpO2 95%   BMI 29.24 kg/m   Advance Care Plan: The advanced care plan/surrogate decision maker was discussed at the time of visit and documented in the medical record.    Physical Exam Vitals and nursing note reviewed.  Constitutional:      Appearance: He is well-developed.  HENT:     Head: Normocephalic.  Cardiovascular:     Rate and Rhythm: Regular rhythm. Bradycardia present.     Heart sounds: Normal heart sounds.  Pulmonary:     Effort: Pulmonary effort is normal.     Breath sounds: Normal breath sounds.  Abdominal:     General: There is distension.  Musculoskeletal:        General: Normal range of motion.     Cervical back: Normal range of motion.  Skin:    General: Skin is warm and dry.  Neurological:     General: No focal deficit present.     Mental Status: He is alert and oriented to person, place, and time. Mental status is at baseline.  Psychiatric:        Mood and Affect: Mood normal.        Thought Content: Thought content normal.        Judgment: Judgment normal.     Imaging: DG Chest 1 View Result Date: 07/12/2023 CLINICAL DATA:  pleural effusion noted on recent US . reevaluation after paracentesis EXAM: CHEST  1 VIEW COMPARISON:  June 26, 2023, January 29, 2022 FINDINGS: The cardiomediastinal silhouette is unchanged in contour.Status post median sternotomy. Trace RIGHT pleural effusion. No pneumothorax. No acute pleuroparenchymal abnormality. Fluffy sclerosis within the LEFT proximal humerus, similar in  comparison to more remote priors and consistent with an etiology such is a bone infarction or enchondroma. IMPRESSION: Trace RIGHT pleural effusion. No  pneumothorax. Electronically Signed   By: Clancy Crimes [name]   On: 07/12/2023 13:04   CT ABDOMEN PELVIS W WO CONTRAST Result Date: 07/04/2023 CLINICAL DATA:  19-year-old male with history of cirrhosis and refractory ascites. Preprocedure planning prior to possible transjugular intrahepatic portosystemic shunt creation. EXAM: CT ABDOMEN AND PELVIS WITHOUT AND WITH CONTRAST TECHNIQUE: Multidetector CT imaging of the abdomen and pelvis was performed using the standard protocol after bolus administration of intravenous contrast. Delayed imaging at 90 seconds was also obtained. multiplanar reconstructed images and MIPs were obtained and reviewed to evaluate the vascular anatomy. CONTRAST:  75mL ISOVUE -300 IOPAMIDOL  (ISOVUE -300) INJECTION 61% COMPARISON:  12/24/2022 FINDINGS: VASCULAR IVC: Normal caliber and patent.  No variant anatomy. Renal veins: Patent bilaterally. Iliac veins: The bilateral common, external, and internal iliac veins are patent. Femoral veins: The visualized bilateral common and central aspects of the bilateral superficial and profunda veins are patent. Portal system: The hepatic veins are poorly visualized. The intra and extrahepatic portal veins are patent and normal in caliber. No evidence of gastroesophageal varices or native portosystemic shunts. Arterial system: Normal caliber abdominal aorta. Advanced atherosclerotic calcifications. Review of the MIP images confirms the above findings. NON-VASCULAR Lower chest: Trace right pleural effusion. Mild global cardiomegaly. No pericardial effusion. Hepatobiliary: Similar appearing relatively small volume liver minimal surface nodularity. No focal masses. The gallbladder is present. No intra or extrahepatic biliary ductal dilation. Pancreas: Unremarkable. No pancreatic ductal dilatation or surrounding inflammatory changes. Spleen: Normal in size without focal abnormality. Adrenals/Urinary Tract: Adrenal glands are unremarkable. Similar appearing  right greater than left renal cortical thinning with scattered simple cortical cysts bilaterally, none requiring additional follow-up. No renal calculi, focal lesion, or hydronephrosis. Bladder is unremarkable. Stomach/Bowel: Stomach is within normal limits. Appendix appears normal. There are a few scattered colonic diverticula without surrounding inflammatory changes. No evidence of bowel wall thickening, distention, or inflammatory changes. Lymphatic: No abdominopelvic lymphadenopathy. Reproductive: Prostate is unremarkable. Other: Small volume ascites. Similar appearing small umbilical hernia containing ascites. Musculoskeletal: Anterior wedge compression deformity of the L1 vertebral body, new from 12/24/2022 comparison with associated a proximally 60% height loss and no evidence of retropulsion. Scattered advanced multilevel degenerative change of the visualized thoracolumbar spine. Similar appearing postsurgical changes after right total hip arthroplasty, partially visualized. IMPRESSION: VASCULAR 1. Patent portal system without evidence of gastroesophageal varices or portosystemic shunts. 2.  Aortic Atherosclerosis (ICD10-I70.0). NON-VASCULAR 1. Similar appearing morphologic changes of cirrhosis and portal hypertension is evidence by small volume ascites and trace right hepatic hydrothorax. 2. Age indeterminate however new from comparison L1 anterior wedge compression deformity. 3. Diverticulosis. Creasie Doctor, MD [facility] Electronically Signed   By: Creasie Doctor [name]   On: 07/04/2023 06:23   US  Paracentesis Result Date: 07/01/2023 INDICATION: Recurrent ascites, abdominal distension, cirrhosis EXAM: ULTRASOUND GUIDED THERAPEUTIC AND DIAGNOSTIC PARACENTESIS MEDICATIONS: 1% lidocaine  local COMPLICATIONS: None immediate. PROCEDURE: Informed written consent was obtained from the patient after a discussion of the risks, benefits and alternatives to  treatment. A timeout was performed prior to the initiation of the procedure. Initial ultrasound scanning demonstrates a large amount of ascites within the right lower abdominal quadrant. The right lower abdomen was prepped and draped in the usual sterile fashion. 1% lidocaine  was used for local anesthesia. Following this, a 6 Fr Safe-T-Centesis catheter was introduced. An ultrasound image was saved  for documentation purposes. The paracentesis was performed. The catheter was removed and a dressing was applied. The patient tolerated the procedure well without immediate post procedural complication. FINDINGS: A total of approximately 1.3 L of clear peritoneal fluid was removed. Samples were sent to the laboratory as requested by the clinical team. IMPRESSION: Successful ultrasound-guided paracentesis yielding 1.3 liters of peritoneal fluid. Electronically Signed   By: Melven Stable.  Shick [name]   On: 07/01/2023 11:20   IR Radiologist Eval & Mgmt Result Date: 06/25/2023 EXAM: ESTABLISHED PATIENT OFFICE VISIT CHIEF COMPLAINT: See below HISTORY OF PRESENT ILLNESS: See below REVIEW OF SYSTEMS: See below PHYSICAL EXAMINATION: See below ASSESSMENT AND PLAN: Please refer to completed note in the electronic medical record on Caleb Wolf Epic Art Largo, MD [facility] Electronically Signed   By: Art Largo [name]   On: 06/25/2023 10:22   US  Abdomen Limited RUQ (LIVER/GB) Result Date: 06/24/2023 CLINICAL DATA:  Cirrhosis EXAM: ULTRASOUND ABDOMEN LIMITED RIGHT UPPER QUADRANT COMPARISON:  CT abdomen pelvis 12/24/2022 FINDINGS: Gallbladder: No gallstones or wall thickening visualized. No sonographic Murphy sign noted by sonographer. Common bile duct: Diameter: 3.1 mm Liver: Coarsened echogenicity and nodular contour. No focal lesion. Portal vein is patent on color Doppler imaging with normal direction of blood flow towards the liver. Other: Moderate volume ascites.  Right  pleural effusion. IMPRESSION: 1. Cirrhotic morphology of the liver. No focal lesion. 2. Moderate volume ascites. 3. Right pleural effusion. Electronically Signed   By: Jone Neither [name]   On: 06/24/2023 11:31   VAS US  CAROTID Result Date: 06/18/2023 Carotid Arterial Duplex Study Patient Name:  Caleb Wolf  Date of Exam:   06/18/2023 Medical Rec #: [id number]     Accession #:    [id number] Date of Birth: [birth year]      Patient Gender: M Patient Age:   68 years Exam Location:  [facility] Procedure:      VAS US  CAROTID Referring Phys: Melodie Spry ROBINS --------------------------------------------------------------------------------  Indications:       Carotid artery disease and left endarterectomy 02/17/2014. Risk Factors:      Hypertension, hyperlipidemia, coronary artery disease. Other Factors:     04/19/2021 Carotid artery duplex - Right carotid: The CCA and                    ICA appear occluded. Comparison Study:  No change from prior exam of 06/10/2022. Performing Technologist: Homer Lust RVT  Examination Guidelines: A complete evaluation includes B-mode imaging, spectral Doppler, color Doppler, and power Doppler as needed of all accessible portions of each vessel. Bilateral testing is considered an integral part of a complete examination. Limited examinations for reoccurring indications may be performed as noted.  Right Carotid Findings: +----------+--------+--------+--------+-----------------+----------------------+           PSV cm/sEDV cm/sStenosisPlaque           Comments                                                 Description                             +----------+--------+--------+--------+-----------------+----------------------+ CCA Prox  18      2       Occluded                                        +----------+--------+--------+--------+-----------------+----------------------+  CCA Mid                   Occluded                                         +----------+--------+--------+--------+-----------------+----------------------+ CCA Distal                Occluded                                        +----------+--------+--------+--------+-----------------+----------------------+ ICA Prox  41                                       recanalized flow       +----------+--------+--------+--------+-----------------+----------------------+ ICA Mid   24                                       Atypical waveform,                                                        recannalized           +----------+--------+--------+--------+-----------------+----------------------+ ICA Distal33                                       Atypical waveform,                                                        recannalized.          +----------+--------+--------+--------+-----------------+----------------------+ ECA       45      1                                                       +----------+--------+--------+--------+-----------------+----------------------+ +----------+--------+-------+----------------+-------------------+           PSV cm/sEDV cmsDescribe        Arm Pressure (mmHG) +----------+--------+-------+----------------+-------------------+ [id number]     14     Multiphasic, [id number]                 +----------+--------+-------+----------------+-------------------+ +---------+--------+--+--------+--+---------+ VertebralPSV cm/s63EDV cm/s14Antegrade +---------+--------+--+--------+--+---------+  Left Carotid Findings: +----------+--------+--------+--------+------------------+--------+           PSV cm/sEDV cm/sStenosisPlaque DescriptionComments +----------+--------+--------+--------+------------------+--------+ CCA Prox  116     16                                         +----------+--------+--------+--------+------------------+--------+ CCA Mid   142     18  heterogenous                +----------+--------+--------+--------+------------------+--------+ CCA Distal152     20              heterogenous               +----------+--------+--------+--------+------------------+--------+ ICA Prox  134     20      1-39%                              +----------+--------+--------+--------+------------------+--------+ ICA Mid   108     24                                         +----------+--------+--------+--------+------------------+--------+ ICA Distal135     24                                         +----------+--------+--------+--------+------------------+--------+ ECA       145     6                                          +----------+--------+--------+--------+------------------+--------+ +----------+--------+--------+----------------+-------------------+           PSV cm/sEDV cm/sDescribe        Arm Pressure (mmHG) +----------+--------+--------+----------------+-------------------+ [id number]             Multiphasic, [id number]                 +----------+--------+--------+----------------+-------------------+ +---------+--------+--+--------+--+---------+ VertebralPSV cm/s57EDV cm/s15Antegrade +---------+--------+--+--------+--+---------+   Summary: Right Carotid: The right ICA appears to have recanalized with atypical waveform. Left Carotid: Velocities in the left ICA are consistent with a 1-39% stenosis. Vertebrals:  Bilateral vertebral arteries demonstrate antegrade flow. Subclavians: Normal flow hemodynamics were seen in bilateral subclavian              arteries. *See table(s) above for measurements and observations.  Electronically signed by Irvin Mantel on 06/18/2023 at 5:16:30 PM.    Final     Labs:  CBC: Recent Labs    01/24/23 2259 03/18/23 1251 06/17/23 1429 07/13/23 0648  WBC 8.5 6.1 6.5 8.3  HGB 9.5* 11.6* 11.4* 10.7*  HCT 30.0* 37.6* 36.6* 34.1*  PLT 287 259 243 260    COAGS: Recent Labs    01/16/23 0410  03/18/23 1251 06/17/23 1429 07/13/23 0648  INR 2.3* 2.3* 1.2 1.1    BMP: Recent Labs    01/16/23 0410 01/17/23 0452 01/19/23 1612 01/24/23 2259 03/18/23 1251 06/17/23 1429  NA 135 135 139 131* 139 143  K 4.4 3.6 4.7 3.6 4.8 4.4  CL 99 102 99 97* 98 101  CO2 28 24 26 24 31 26   GLUCOSE 104* 94 97 106* 106* 83  BUN 35* 31* 35* 49* 24* 27  CALCIUM  8.2* 8.0* 8.7 7.8* 8.7* 8.6  CREATININE 2.07* 1.75* 1.92* 2.34* 1.75* 1.92*  GFRNONAA 32* 39*  --  27* 39*  --     LIVER FUNCTION TESTS: Recent Labs    01/19/23 1612 01/24/23 2259 03/18/23 1251 06/17/23 1429  BILITOT 0.2 0.3 0.5 0.2  AST 22 23 28 16   ALT 28 29 31 20   ALKPHOS 87  72 83 127*  PROT 5.9* 5.6* 6.1* 5.6*  ALBUMIN  4.1 2.9* 2.9* 3.5*    TUMOR MARKERS: Recent Labs    11/18/22 1001  AFPTM 2.8    Assessment and Plan:  83 y.o. Male outpatient. History of a fib s/p ablation , HLD, GERD, HTN, CAD s/p CABG, CHF,  CKD, AAA, carotid stenosis. Former remote drinker with decompensated cirrhosis with recurrent ascites and hepatic encephalopathy controlled by lactulose . The Patient along with his wife was seen for consultation in the [facility] via telephone on 1.23.25 with IR Attending Dr. Art Largo. The Patient's clinical picture was discussed. After frank evaluation regarding treatment options and possible outcomes the patent decided to proceed with transjugular, transhepatic portal venogram with possible portal vein angioplasty, possible portal vein stent placement, variceal embolization in addition to splenorenal shunt coil embolization. Patient presents for TIPS with anesthesia.   PLAN: IR Image guided TIP and paracentesis with general anesthesia  Risks and benefits of TIPS, BRTO and/or additional variceal embolization were discussed with the patient and/or the patient's family including, but not limited to, infection, bleeding, damage to adjacent structures, worsening hepatic and/or cardiac function,  worsening and/or the development of altered mental status/encephalopathy, non-target embolization and death.   This interventional procedure involves the use of X-rays and because of the nature of the planned procedure, it is possible that we will have prolonged use of X-ray fluoroscopy.  Potential radiation risks to you include (but are not limited to) the following: - A slightly elevated risk for cancer  several years later in life. This risk is typically less than 0.5% percent. This risk is low in comparison to the normal incidence of human cancer, which is 33% for women and 50% for men according to the [employer]. - Radiation induced injury can include skin redness, resembling a rash, tissue breakdown / ulcers and hair loss (which can be temporary or permanent).   The likelihood of either of these occurring depends on the difficulty of the procedure and whether you are sensitive to radiation due to previous procedures, disease, or genetic conditions.   IF your procedure requires a prolonged use of radiation, you will be notified and given written instructions for further action.  It is your responsibility to monitor the irradiated area for the 2 weeks following the procedure and to notify your physician if you are concerned that you have suffered a radiation induced injury.    All of the patient's questions were answered, patient is agreeable to proceed.  Consent signed and in chart.     Thank you for this interesting consult.  I greatly enjoyed meeting Caleb Wolf and look forward to participating in their care.  A copy of this report was sent to the requesting provider on this date.  Electronically Signed: Marceil Sensor, NP 07/13/2023, 7:43 AM   I spent a total of 40 Minutes    in face to face in clinical consultation, greater than 50% of which was counseling/coordinating care for TIPS and paracentesis

## 2023-07-10 NOTE — Progress Notes (Signed)
 PCP - Dr. Gaither Langton Cardiologist - Dr. Oneil Parchment EP- Dr. Lynwood Rakers  PPM/ICD - denies   Chest x-ray - 07/02/23- not read EKG - 01/24/23 Stress Test - 02/14/14 ECHO - 12/31/22 Cardiac Cath - 05/22/14  CPAP - OSA+, CPAP nightly w/ 2L O2, unsure of pressure settings  DM- denies  Hold Jardiance  72 hours. Last dose 2/6.  Blood Thinner Instructions: Hold Xarelto  2 days. Last dose 2/7. Aspirin  Instructions: n/a  ERAS Protcol - clears until 0545  COVID TEST- n/a  Anesthesia review: yes, cardiac hx, O2 dependent at bedtime  Patient verbally denies any shortness of breath, fever, cough and chest pain during phone call     Questions were answered with pt's wife. Patient's wife, Caleb Wolf,  verbalized understanding of instructions.

## 2023-07-10 NOTE — Anesthesia Preprocedure Evaluation (Addendum)
 Anesthesia Evaluation  Patient identified by MRN, date of birth, ID band Patient awake    Reviewed: Allergy & Precautions, NPO status , Patient's Chart, lab work & pertinent test results  Airway Mallampati: II  TM Distance: >3 FB Neck ROM: Full    Dental no notable dental hx.    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   Pulmonary exam normal        Cardiovascular hypertension, + CAD, + Past MI and + CABG (x 3 in 2015)  Normal cardiovascular exam+ dysrhythmias Atrial Fibrillation      Neuro/Psych  Neuromuscular disease  negative psych ROS   GI/Hepatic hiatal hernia,GERD  Medicated and Controlled,,(+) Cirrhosis   ascites      Endo/Other  negative endocrine ROS    Renal/GU Renal InsufficiencyRenal disease     Musculoskeletal  (+) Arthritis ,    Abdominal   Peds  Hematology  (+) Blood dyscrasia (Xarelto ), anemia   Anesthesia Other Findings Portal hypertension  Reproductive/Obstetrics                             Anesthesia Physical Anesthesia Plan  ASA: 4  Anesthesia Plan: General   Post-op Pain Management:    Induction: Intravenous  PONV Risk Score and Plan: 2 and Ondansetron , Dexamethasone  and Treatment may vary due to age or medical condition  Airway Management Planned: Oral ETT  Additional Equipment: Arterial line  Intra-op Plan:   Post-operative Plan: Extubation in OR  Informed Consent: I have reviewed the patients History and Physical, chart, labs and discussed the procedure including the risks, benefits and alternatives for the proposed anesthesia with the patient or authorized representative who has indicated his/her understanding and acceptance.     Dental advisory given  Plan Discussed with: CRNA  Anesthesia Plan  Comments: (PAT note by Lynwood Hope, PA-C: 83 year old male with pertinent history including CAD s/p CABG x 3 2015, HFmrEF ~45% and Afib s/p ablation x2 and DCCV 06/2021, CKD 3B, OSA on CPAP with 2L O2, hiatal hernia, remote EtOH Hx, quit 20 yrs prior, decompensated cirrhosis with refractory ascites requring serial paracenteses, carotid stenosis ( s/p  left CEA for asymptomatic stenosis 2015.  He had a resection of a redundant segment of the left ICA with primary reanastomosis.  He has a known right ICA occlusion.).   Seen by interventional radiologist Dr. Hughes 06/25/2023, TIPS recommended at that time (originally recommended to have TIPS in July 2024, however, he had hospitalizations x2 for SBP workup and fall, with acute worsening of his liver function).  Per 06/25/2023 note, MELD 15, Child-Pugh class B.  Patient previously had preop cardiology evaluation for TIPS procedure by Barnie Hila, NP on 01/02/2023.  Per note, Chart reviewed as part of pre-operative protocol coverage. According to the RCRI, patient has a 6.6% risk of MACE. Patient reports activity equivalent to >4.0 METS (rides recumbent bike 2-3 times a week, walks a lot). Given past medical history and time since last visit, based on ACC/AHA guidelines, Drew W Agredano would be at acceptable risk for the planned procedure without further cardiovascular testing. Patient was advised that if he develops new symptoms prior to surgery to contact our office to arrange a follow-up appointment.  he verbalized understanding.  CMP and CBC from 06/17/2023 reviewed, creatinine elevated 1.92 consistent with history of CKD, albumin  3.5, transaminases  normal, total protein 5.6, hemoglobin 11.4, platelets 243.  INR 1.2.  Patient states he was instructed to hold Xarelto  2 days.  Pt will need DOS labs and eval.   EKG 01/26/23: Sinus rhythm.  Rate 54. Borderline prolonged PR interval. Nonspecific intraventricular conduction delay.  Anteroseptal  infarct  Carotid duplex 06/18/2023: Summary:  Right Carotid: The right ICA appears to have recanalized with atypical waveform.   Left Carotid: Velocities in the left ICA are consistent with a 1-39% stenosis.   Vertebrals:Bilateral vertebral arteries demonstrate antegrade flow.  Subclavians: Normal flow hemodynamics were seen in bilateral subclavian arteries.   TTE 12/31/2022: 1. Left ventricular ejection fraction, by estimation, is 45 to 50%. The  left ventricle has mildly decreased function. The left ventricle  demonstrates regional wall motion abnormalities (see scoring  diagram/findings for description). Left ventricular  diastolic parameters are indeterminate.  2. Right ventricular systolic function is normal. The right ventricular  size is normal. There is mildly elevated pulmonary artery systolic  pressure. The estimated right ventricular systolic pressure is 42.4 mmHg.  3. Left atrial size was severely dilated.  4. Right atrial size was mildly dilated.  5. The mitral valve is normal in structure. Trivial mitral valve  regurgitation.  6. The aortic valve is tricuspid. Aortic valve regurgitation is not  visualized. Aortic valve sclerosis/calcification is present, without any  evidence of aortic stenosis.  7. The inferior vena cava is normal in size with greater than 50%  respiratory variability, suggesting right atrial pressure of 3 mmHg.    )        Anesthesia Quick Evaluation

## 2023-07-10 NOTE — Progress Notes (Addendum)
 Anesthesia Chart Review: Same day workup  83 year old male with pertinent history including CAD s/p CABG x 3 2015, HFmrEF ~45% and Afib s/p ablation x2 and DCCV 06/2021, CKD 3B, OSA on CPAP with 2L O2, hiatal hernia, remote EtOH Hx, quit 20 yrs prior, decompensated cirrhosis with refractory ascites requring serial paracenteses, carotid stenosis ( s/p  left CEA for asymptomatic stenosis 2015.   He had a resection of a redundant segment of the left ICA with primary reanastomosis.   He has a known right ICA occlusion.).   Seen by interventional radiologist Dr. Hughes 06/25/2023, TIPS recommended at that time (originally recommended to have TIPS in July 2024, however, he had hospitalizations x2 for SBP workup and fall, with acute worsening of his liver function).  Per 06/25/2023 note, MELD 15, Child-Pugh class B.  Patient previously had preop cardiology evaluation for TIPS procedure by Barnie Hila, NP on 01/02/2023.  Per note, Chart reviewed as part of pre-operative protocol coverage. According to the RCRI, patient has a 6.6% risk of MACE. Patient reports activity equivalent to >4.0 METS (rides recumbent bike 2-3 times a week, walks a lot). Given past medical history and time since last visit, based on ACC/AHA guidelines, Jsean W Maloney would be at acceptable risk for the planned procedure without further cardiovascular testing. Patient was advised that if he develops new symptoms prior to surgery to contact our office to arrange a follow-up appointment.  he verbalized understanding.  CMP and CBC from 06/17/2023 reviewed, creatinine elevated 1.92 consistent with history of CKD, albumin  3.5, transaminases normal, total protein 5.6, hemoglobin 11.4, platelets 243.  INR 1.2.  Patient states he was instructed to hold Xarelto  2 days.  Pt will need DOS labs and eval.   EKG 01/26/23: Sinus rhythm.  Rate 54. Borderline prolonged PR interval. Nonspecific intraventricular conduction delay.  Anteroseptal  infarct  Carotid duplex 06/18/2023: Summary:  Right Carotid: The right ICA appears to have recanalized with atypical waveform.   Left Carotid: Velocities in the left ICA are consistent with a 1-39% stenosis.   Vertebrals: Bilateral vertebral arteries demonstrate antegrade flow.  Subclavians: Normal flow hemodynamics were seen in bilateral subclavian arteries.   TTE 12/31/2022:  1. Left ventricular ejection fraction, by estimation, is 45 to 50%. The  left ventricle has mildly decreased function. The left ventricle  demonstrates regional wall motion abnormalities (see scoring  diagram/findings for description). Left ventricular  diastolic parameters are indeterminate.   2. Right ventricular systolic function is normal. The right ventricular  size is normal. There is mildly elevated pulmonary artery systolic  pressure. The estimated right ventricular systolic pressure is 42.4 mmHg.   3. Left atrial size was severely dilated.   4. Right atrial size was mildly dilated.   5. The mitral valve is normal in structure. Trivial mitral valve  regurgitation.   6. The aortic valve is tricuspid. Aortic valve regurgitation is not  visualized. Aortic valve sclerosis/calcification is present, without any  evidence of aortic stenosis.   7. The inferior vena cava is normal in size with greater than 50%  respiratory variability, suggesting right atrial pressure of 3 mmHg.    Lynwood Geofm RIGGERS [facility] Short Stay Center/Anesthesiology Phone [phone] 07/10/2023 10:24 AM

## 2023-07-13 ENCOUNTER — Inpatient Hospital Stay (HOSPITAL_COMMUNITY): Payer: Medicare HMO | Admitting: Physician Assistant

## 2023-07-13 ENCOUNTER — Inpatient Hospital Stay (HOSPITAL_COMMUNITY): Payer: Self-pay | Admitting: Physician Assistant

## 2023-07-13 ENCOUNTER — Encounter (HOSPITAL_COMMUNITY): Payer: Self-pay | Admitting: Interventional Radiology

## 2023-07-13 ENCOUNTER — Inpatient Hospital Stay (HOSPITAL_COMMUNITY)
Admission: RE | Admit: 2023-07-13 | Discharge: 2023-07-13 | Disposition: A | Discharge status: Home / Self Care | Payer: Medicare HMO | Source: Ambulatory Visit | Attending: Interventional Radiology

## 2023-07-13 ENCOUNTER — Inpatient Hospital Stay (HOSPITAL_COMMUNITY)
Admission: RE | Admit: 2023-07-13 | Discharge: 2023-07-14 | DRG: 406 | Disposition: A | Discharge status: Home / Self Care | Payer: Medicare HMO | Attending: Student | Admitting: Student

## 2023-07-13 ENCOUNTER — Other Ambulatory Visit: Payer: Self-pay

## 2023-07-13 ENCOUNTER — Encounter (HOSPITAL_COMMUNITY)
Admission: RE | Disposition: A | Discharge status: Home / Self Care | Payer: Self-pay | Source: Home / Self Care | Attending: Internal Medicine

## 2023-07-13 DIAGNOSIS — Z860100 Personal history of colon polyps, unspecified: Secondary | ICD-10-CM

## 2023-07-13 DIAGNOSIS — Z8249 Family history of ischemic heart disease and other diseases of the circulatory system: Secondary | ICD-10-CM

## 2023-07-13 DIAGNOSIS — I259 Chronic ischemic heart disease, unspecified: Secondary | ICD-10-CM | POA: Diagnosis not present

## 2023-07-13 DIAGNOSIS — Z951 Presence of aortocoronary bypass graft: Secondary | ICD-10-CM | POA: Diagnosis not present

## 2023-07-13 DIAGNOSIS — Z7901 Long term (current) use of anticoagulants: Secondary | ICD-10-CM

## 2023-07-13 DIAGNOSIS — Z87891 Personal history of nicotine dependence: Secondary | ICD-10-CM

## 2023-07-13 DIAGNOSIS — I480 Paroxysmal atrial fibrillation: Secondary | ICD-10-CM | POA: Diagnosis not present

## 2023-07-13 DIAGNOSIS — I4819 Other persistent atrial fibrillation: Secondary | ICD-10-CM | POA: Diagnosis present

## 2023-07-13 DIAGNOSIS — I10 Essential (primary) hypertension: Secondary | ICD-10-CM

## 2023-07-13 DIAGNOSIS — K7031 Alcoholic cirrhosis of liver with ascites: Secondary | ICD-10-CM | POA: Diagnosis not present

## 2023-07-13 DIAGNOSIS — K219 Gastro-esophageal reflux disease without esophagitis: Secondary | ICD-10-CM | POA: Diagnosis not present

## 2023-07-13 DIAGNOSIS — Z96641 Presence of right artificial hip joint: Secondary | ICD-10-CM | POA: Diagnosis not present

## 2023-07-13 DIAGNOSIS — I252 Old myocardial infarction: Secondary | ICD-10-CM

## 2023-07-13 DIAGNOSIS — Z885 Allergy status to narcotic agent status: Secondary | ICD-10-CM

## 2023-07-13 DIAGNOSIS — Z85828 Personal history of other malignant neoplasm of skin: Secondary | ICD-10-CM | POA: Diagnosis not present

## 2023-07-13 DIAGNOSIS — K7290 Hepatic failure, unspecified without coma: Secondary | ICD-10-CM | POA: Diagnosis present

## 2023-07-13 DIAGNOSIS — K7460 Unspecified cirrhosis of liver: Secondary | ICD-10-CM | POA: Diagnosis not present

## 2023-07-13 DIAGNOSIS — D509 Iron deficiency anemia, unspecified: Secondary | ICD-10-CM | POA: Diagnosis not present

## 2023-07-13 DIAGNOSIS — Z7984 Long term (current) use of oral hypoglycemic drugs: Secondary | ICD-10-CM | POA: Diagnosis not present

## 2023-07-13 DIAGNOSIS — N1832 Chronic kidney disease, stage 3b: Secondary | ICD-10-CM | POA: Diagnosis present

## 2023-07-13 DIAGNOSIS — I517 Cardiomegaly: Secondary | ICD-10-CM | POA: Diagnosis not present

## 2023-07-13 DIAGNOSIS — I2510 Atherosclerotic heart disease of native coronary artery without angina pectoris: Secondary | ICD-10-CM | POA: Diagnosis not present

## 2023-07-13 DIAGNOSIS — R188 Other ascites: Secondary | ICD-10-CM | POA: Diagnosis not present

## 2023-07-13 DIAGNOSIS — K766 Portal hypertension: Principal | ICD-10-CM | POA: Diagnosis present

## 2023-07-13 DIAGNOSIS — G4730 Sleep apnea, unspecified: Secondary | ICD-10-CM | POA: Diagnosis present

## 2023-07-13 DIAGNOSIS — E785 Hyperlipidemia, unspecified: Secondary | ICD-10-CM | POA: Diagnosis present

## 2023-07-13 DIAGNOSIS — Z79899 Other long term (current) drug therapy: Secondary | ICD-10-CM

## 2023-07-13 DIAGNOSIS — Z9981 Dependence on supplemental oxygen: Secondary | ICD-10-CM

## 2023-07-13 DIAGNOSIS — I714 Abdominal aortic aneurysm, without rupture, unspecified: Secondary | ICD-10-CM | POA: Diagnosis present

## 2023-07-13 DIAGNOSIS — I700 Atherosclerosis of aorta: Secondary | ICD-10-CM | POA: Diagnosis not present

## 2023-07-13 DIAGNOSIS — N400 Enlarged prostate without lower urinary tract symptoms: Secondary | ICD-10-CM | POA: Diagnosis not present

## 2023-07-13 DIAGNOSIS — Z01818 Encounter for other preprocedural examination: Secondary | ICD-10-CM

## 2023-07-13 DIAGNOSIS — Z87442 Personal history of urinary calculi: Secondary | ICD-10-CM

## 2023-07-13 DIAGNOSIS — I129 Hypertensive chronic kidney disease with stage 1 through stage 4 chronic kidney disease, or unspecified chronic kidney disease: Secondary | ICD-10-CM | POA: Diagnosis not present

## 2023-07-13 DIAGNOSIS — E7800 Pure hypercholesterolemia, unspecified: Secondary | ICD-10-CM

## 2023-07-13 DIAGNOSIS — D638 Anemia in other chronic diseases classified elsewhere: Secondary | ICD-10-CM | POA: Diagnosis not present

## 2023-07-13 DIAGNOSIS — Z9889 Other specified postprocedural states: Secondary | ICD-10-CM | POA: Diagnosis not present

## 2023-07-13 HISTORY — DX: Personal history of other diseases of the digestive system: Z87.19

## 2023-07-13 HISTORY — PX: IR US GUIDE VASC ACCESS RIGHT: IMG2390

## 2023-07-13 HISTORY — DX: Dependence on supplemental oxygen: Z99.81

## 2023-07-13 HISTORY — DX: Spinal stenosis, site unspecified: M48.00

## 2023-07-13 HISTORY — DX: Unspecified cirrhosis of liver: K74.60

## 2023-07-13 HISTORY — PX: TIPS PROCEDURE: SHX808

## 2023-07-13 HISTORY — PX: IR TIPS: IMG2295

## 2023-07-13 LAB — COMPREHENSIVE METABOLIC PANEL
ALT: 37 U/L (ref 0–44)
AST: 32 U/L (ref 15–41)
Albumin: 2.7 g/dL — ABNORMAL LOW (ref 3.5–5.0)
Alkaline Phosphatase: 89 U/L (ref 38–126)
Anion gap: 11 (ref 5–15)
BUN: 35 mg/dL — ABNORMAL HIGH (ref 8–23)
CO2: 28 mmol/L (ref 22–32)
Calcium: 8.5 mg/dL — ABNORMAL LOW (ref 8.9–10.3)
Chloride: 102 mmol/L (ref 98–111)
Creatinine, Ser: 1.8 mg/dL — ABNORMAL HIGH (ref 0.61–1.24)
GFR, Estimated: 38 mL/min — ABNORMAL LOW (ref >60)
Glucose, Bld: 97 mg/dL (ref 70–99)
Potassium: 3.8 mmol/L (ref 3.5–5.1)
Sodium: 141 mmol/L (ref 135–145)
Total Bilirubin: 0.8 mg/dL (ref 0.0–1.2)
Total Protein: 5.9 g/dL — ABNORMAL LOW (ref 6.5–8.1)

## 2023-07-13 LAB — CBC
HCT: 34.1 % — ABNORMAL LOW (ref 39.0–52.0)
Hemoglobin: 10.7 g/dL — ABNORMAL LOW (ref 13.0–17.0)
MCH: 30.1 pg (ref 26.0–34.0)
MCHC: 31.4 g/dL (ref 30.0–36.0)
MCV: 96.1 fL (ref 80.0–100.0)
Platelets: 260 10*3/uL (ref 150–400)
RBC: 3.55 MIL/uL — ABNORMAL LOW (ref 4.22–5.81)
RDW: 14.6 % (ref 11.5–15.5)
WBC: 8.3 10*3/uL (ref 4.0–10.5)
nRBC: 0 % (ref 0.0–0.2)

## 2023-07-13 LAB — PROTIME-INR
INR: 1.1 (ref 0.8–1.2)
Prothrombin Time: 14.6 s (ref 11.4–15.2)

## 2023-07-13 LAB — AMMONIA: Ammonia: <10 umol/L (ref 9–35)

## 2023-07-13 LAB — TYPE AND SCREEN
ABO/RH(D): A POS
Antibody Screen: NEGATIVE

## 2023-07-13 SURGERY — INSERTION, SHUNT, INTRAHEPATIC PORTOSYSTEMIC, TRANSJUGULAR
Anesthesia: General

## 2023-07-13 MED ORDER — ONDANSETRON HCL 4 MG PO TABS: 4.0000 mg | ORAL_TABLET | Freq: Four times a day (QID) | ORAL | Status: DC | PRN

## 2023-07-13 MED ORDER — SPIRONOLACTONE 25 MG PO TABS
25.0000 mg | ORAL_TABLET | Freq: Every day | ORAL | Status: DC
  Administered 2023-07-14: 25 mg via ORAL
  Filled 2023-07-13: qty 1

## 2023-07-13 MED ORDER — IOHEXOL 300 MG/ML  SOLN
150.0000 mL | Freq: Once | INTRAMUSCULAR | Status: AC | PRN
Start: 2023-07-13 — End: 2023-07-13
  Administered 2023-07-13: 50 mL via INTRAVENOUS

## 2023-07-13 MED ORDER — LIDOCAINE-EPINEPHRINE 1 %-1:100000 IJ SOLN
20.0000 mL | Freq: Once | INTRAMUSCULAR | Status: AC
  Administered 2023-07-13: 20 mL

## 2023-07-13 MED ORDER — EMPAGLIFLOZIN 10 MG PO TABS
10.0000 mg | ORAL_TABLET | Freq: Every day | ORAL | Status: DC
  Administered 2023-07-14: 10 mg via ORAL
  Filled 2023-07-13 (×2): qty 1

## 2023-07-13 MED ORDER — ONDANSETRON HCL 4 MG/2ML IJ SOLN: 4.0000 mg | Freq: Once | INTRAMUSCULAR | Status: DC | PRN

## 2023-07-13 MED ORDER — DOXAZOSIN MESYLATE 4 MG PO TABS
4.0000 mg | ORAL_TABLET | Freq: Every day | ORAL | Status: DC
  Administered 2023-07-14: 4 mg via ORAL
  Filled 2023-07-13: qty 1

## 2023-07-13 MED ORDER — FENTANYL CITRATE (PF) 250 MCG/5ML IJ SOLN
INTRAMUSCULAR | Status: AC
  Filled 2023-07-13: qty 5

## 2023-07-13 MED ORDER — BOOST PO LIQD
1.0000 | Freq: Every day | ORAL | Status: DC
  Administered 2023-07-13 – 2023-07-14 (×2): 237 mL via ORAL
  Filled 2023-07-13 (×3): qty 237

## 2023-07-13 MED ORDER — FUROSEMIDE 40 MG PO TABS
80.0000 mg | ORAL_TABLET | Freq: Two times a day (BID) | ORAL | Status: DC
  Administered 2023-07-13 – 2023-07-14 (×3): 80 mg via ORAL
  Filled 2023-07-13 (×3): qty 2

## 2023-07-13 MED ORDER — ROCURONIUM BROMIDE 100 MG/10ML IV SOLN
INTRAVENOUS | Status: DC | PRN
  Administered 2023-07-13: 60 mg via INTRAVENOUS
  Administered 2023-07-13 (×2): 20 mg via INTRAVENOUS

## 2023-07-13 MED ORDER — LIDOCAINE-EPINEPHRINE 1 %-1:100000 IJ SOLN
INTRAMUSCULAR | Status: AC
  Filled 2023-07-13: qty 1

## 2023-07-13 MED ORDER — EPHEDRINE SULFATE (PRESSORS) 50 MG/ML IJ SOLN
INTRAMUSCULAR | Status: DC | PRN
  Administered 2023-07-13 (×3): 5 mg via INTRAVENOUS

## 2023-07-13 MED ORDER — SUGAMMADEX SODIUM 200 MG/2ML IV SOLN
INTRAVENOUS | Status: DC | PRN
  Administered 2023-07-13: 200 mg via INTRAVENOUS

## 2023-07-13 MED ORDER — SODIUM CHLORIDE 0.9 % IV SOLN: INTRAVENOUS | Status: DC

## 2023-07-13 MED ORDER — LIDOCAINE HCL (CARDIAC) PF 100 MG/5ML IV SOSY
PREFILLED_SYRINGE | INTRAVENOUS | Status: DC | PRN
  Administered 2023-07-13: 60 mg via INTRATRACHEAL

## 2023-07-13 MED ORDER — FINASTERIDE 5 MG PO TABS
5.0000 mg | ORAL_TABLET | Freq: Every evening | ORAL | Status: DC
  Administered 2023-07-13 – 2023-07-14 (×2): 5 mg via ORAL
  Filled 2023-07-13 (×2): qty 1

## 2023-07-13 MED ORDER — LACTATED RINGERS IV SOLN: INTRAVENOUS | Status: DC | PRN

## 2023-07-13 MED ORDER — DEXAMETHASONE SODIUM PHOSPHATE 10 MG/ML IJ SOLN
INTRAMUSCULAR | Status: DC | PRN
  Administered 2023-07-13: 10 mg via INTRAVENOUS

## 2023-07-13 MED ORDER — PROPOFOL 10 MG/ML IV BOLUS
INTRAVENOUS | Status: DC | PRN
  Administered 2023-07-13: 150 mg via INTRAVENOUS

## 2023-07-13 MED ORDER — LACTULOSE 10 GM/15ML PO SOLN
40.0000 g | Freq: Two times a day (BID) | ORAL | Status: DC
  Administered 2023-07-13 – 2023-07-14 (×2): 40 g via ORAL
  Filled 2023-07-13 (×2): qty 60

## 2023-07-13 MED ORDER — FENTANYL CITRATE (PF) 100 MCG/2ML IJ SOLN: 25.0000 ug | INTRAMUSCULAR | Status: DC | PRN

## 2023-07-13 MED ORDER — IOHEXOL 300 MG/ML  SOLN
150.0000 mL | Freq: Once | INTRAMUSCULAR | Status: AC | PRN
  Administered 2023-07-13: 20 mL via INTRAVENOUS

## 2023-07-13 MED ORDER — FLUTICASONE PROPIONATE 50 MCG/ACT NA SUSP: 1.0000 | Freq: Every day | NASAL | Status: DC | PRN

## 2023-07-13 MED ORDER — PANTOPRAZOLE SODIUM 40 MG PO TBEC
40.0000 mg | DELAYED_RELEASE_TABLET | Freq: Every day | ORAL | Status: DC
  Administered 2023-07-14: 40 mg via ORAL
  Filled 2023-07-13: qty 1

## 2023-07-13 MED ORDER — ONDANSETRON HCL 4 MG/2ML IJ SOLN
INTRAMUSCULAR | Status: DC | PRN
  Administered 2023-07-13: 4 mg via INTRAVENOUS

## 2023-07-13 MED ORDER — RIVAROXABAN 15 MG PO TABS
15.0000 mg | ORAL_TABLET | Freq: Every day | ORAL | Status: DC
  Administered 2023-07-14: 15 mg via ORAL
  Filled 2023-07-13: qty 1

## 2023-07-13 MED ORDER — ATORVASTATIN CALCIUM 80 MG PO TABS
80.0000 mg | ORAL_TABLET | Freq: Every evening | ORAL | Status: DC
  Administered 2023-07-13 – 2023-07-14 (×2): 80 mg via ORAL
  Filled 2023-07-13 (×2): qty 1

## 2023-07-13 MED ORDER — AMISULPRIDE (ANTIEMETIC) 5 MG/2ML IV SOLN: 10.0000 mg | Freq: Once | INTRAVENOUS | Status: DC | PRN

## 2023-07-13 MED ORDER — ONDANSETRON HCL 4 MG/2ML IJ SOLN: 4.0000 mg | Freq: Four times a day (QID) | INTRAMUSCULAR | Status: DC | PRN

## 2023-07-13 MED ORDER — ALBUTEROL SULFATE (2.5 MG/3ML) 0.083% IN NEBU: 2.5000 mg | INHALATION_SOLUTION | Freq: Four times a day (QID) | RESPIRATORY_TRACT | Status: DC | PRN

## 2023-07-13 MED ORDER — FENTANYL CITRATE (PF) 250 MCG/5ML IJ SOLN
INTRAMUSCULAR | Status: DC | PRN
  Administered 2023-07-13: 100 ug via INTRAVENOUS

## 2023-07-13 MED ORDER — PHENYLEPHRINE HCL-NACL 20-0.9 MG/250ML-% IV SOLN
INTRAVENOUS | Status: DC | PRN
  Administered 2023-07-13: 25 ug/min via INTRAVENOUS

## 2023-07-13 MED ORDER — SODIUM CHLORIDE 0.9 % IV SOLN
2.0000 g | Freq: Once | INTRAVENOUS | Status: AC
  Administered 2023-07-13: 2 g via INTRAVENOUS
  Filled 2023-07-13 (×2): qty 20

## 2023-07-13 MED ORDER — AMIODARONE HCL 200 MG PO TABS
200.0000 mg | ORAL_TABLET | Freq: Every day | ORAL | Status: DC
  Administered 2023-07-14: 200 mg via ORAL
  Filled 2023-07-13: qty 1

## 2023-07-13 MED ORDER — ALBUMIN HUMAN 5 % IV SOLN
INTRAVENOUS | Status: DC | PRN
Start: 2023-07-13 — End: 2023-07-13

## 2023-07-13 MED ORDER — TIZANIDINE HCL 4 MG PO TABS: 2.0000 mg | ORAL_TABLET | Freq: Two times a day (BID) | ORAL | Status: DC | PRN

## 2023-07-13 MED ORDER — CHLORHEXIDINE GLUCONATE CLOTH 2 % EX PADS: 6.0000 | MEDICATED_PAD | Freq: Every day | CUTANEOUS | Status: DC

## 2023-07-13 MED ORDER — CHLORHEXIDINE GLUCONATE 0.12 % MT SOLN
15.0000 mL | OROMUCOSAL | Status: AC
  Administered 2023-07-13: 15 mL via OROMUCOSAL
  Filled 2023-07-13 (×2): qty 15

## 2023-07-13 NOTE — Procedures (Addendum)
 Vascular and Interventional Radiology Procedure Note  Patient: Caleb Wolf DOB: Jan 08, 1941 Medical Record Number: [id number] Note Date/Time: 07/13/23 9:11 AM   Performing Physician: Art Largo, MD Assistant(s): None  Diagnosis: cirrhosis with pHTN and refractory ascites   Procedure(s):  TRANSJUGULAR INTRAHEPATIC PORTOSYSTEMIC SHUNT (TIPS)  INTRACARDIAC ECHOCARDIOGRAPHY (ICE)   Anesthesia: General Anesthesia Complications: None Estimated Blood Loss: Minimal Specimens: None  Findings:  - access via the RIGHT jugular and femoral veins. - Successful ICE-guided TIPS with placement of 7 cm Viatorr stent - Post TIPS portosystemic gradient of 6 mmHg - Trace ascites. Paracentesis was NOT performed.   Plan: - Admit to courtesy of Hospitalist service. - Bedrest x2 hrs and RLE straight. No HOB restrictions. - OK to advance diet as tolerated and resume Home medications. - Lactulose  TID and monitor for hepatic encephalopathy. - NH3 in AM.  Final report to follow once all images are reviewed and compared with previous studies.  See detailed dictation with images in PACS. The patient tolerated the procedure well without incident or complication and was returned to PACU in stable condition.    Art Largo, MD [facility] Radiology   Pager. [phone] Clinic. [phone]

## 2023-07-13 NOTE — Progress Notes (Signed)
 Chief Complaint: Caleb Wolf was seen in consultation today for decompensated cirrhosis with recurrent ascites status post TIPS procedure in IR today.  Referring Provider(s): Primary Care; Artemisa Bile, MD Gastroenterology;  Urban Garden, MD and Patterson Bora, NP  Supervising Physician: Art Largo  Caleb Wolf Status: [facility] - In-pt   Subjective: Caleb Wolf is alert and resting comfortably in bed on rounds this afternoon. Wife at his side. Currently without any significant complaints.  Caleb Wolf endorses mild abdominal pain. He denies any fevers, headache, chest pain, SOB, cough, nausea, vomiting or bleeding.     Allergies: Codeine sulfate  Medications: Prior to Admission medications   Medication Sig Start Date End Date Taking? Authorizing Provider  acetaminophen  (TYLENOL ) 500 MG tablet Take 1,000 mg by mouth every 6 (six) hours as needed for moderate pain.   Yes [provider]  amiodarone  (PACERONE ) 200 MG tablet TAKE 1 TABLET BY MOUTH EVERY DAY 04/17/23  Yes Hugh Madura, MD  atorvastatin  (LIPITOR) 80 MG tablet TAKE 1 TABLET BY MOUTH EVERY DAY IN THE EVENING 04/17/23  Yes Hugh Madura, MD  Cholecalciferol  (VITAMIN D ) 50 MCG (2000 UT) CAPS Take 2,000 Units by mouth daily.   Yes [provider]  cyanocobalamin  (VITAMIN B12) 1000 MCG tablet Take 1,000 mcg by mouth 2 (two) times a week.   Yes [provider]  doxazosin  (CARDURA ) 4 MG tablet Take 4 mg by mouth in the morning. 07/06/17  Yes [provider]  empagliflozin  (JARDIANCE ) 10 MG TABS tablet TAKE 1 TABLET BY MOUTH DAILY BEFORE BREAKFAST. 06/15/23  Yes Hugh Madura, MD  feeding supplement (BOOST HIGH PROTEIN) LIQD Take 1 Container by mouth daily in the afternoon.   Yes [provider]  finasteride  (PROSCAR ) 5 MG tablet Take 5 mg by mouth every evening.    Yes [provider]  fluticasone  (FLONASE ) 50 MCG/ACT nasal spray Place 1 spray into both nostrils daily as  needed for allergies or rhinitis.   Yes [provider]  furosemide  (LASIX ) 80 MG tablet TAKE 1 TABLET BY MOUTH 2 TIMES DAILY. 06/04/23  Yes Hugh Madura, MD  lactulose  (CHRONULAC ) 10 GM/15ML solution Take 67.5 mLs (45 g total) by mouth 3 (three) times daily. Titrate dosage of lactulose  to 2-3 soft bowels per day Caleb Wolf taking differently: Take 40 g by mouth 2 (two) times daily. 03/17/23  Yes Carlan, Chelsea L, NP  nitroGLYCERIN  (NITROSTAT ) 0.4 MG SL tablet Place 1 tablet (0.4 mg total) under the tongue every 5 (five) minutes x 3 doses as needed for chest pain. 06/04/20  Yes Allred, Royston Cornea, MD  pantoprazole  (PROTONIX ) 40 MG tablet Take 1 tablet (40 mg total) by mouth daily before breakfast. 02/19/16  Yes Rehman, Mathews Solomons, MD  Rivaroxaban  (XARELTO ) 15 MG TABS tablet Take 1 tablet (15 mg total) by mouth daily with supper. 12/16/21  Yes Tat, Myrtie Atkinson, MD  spironolactone  (ALDACTONE ) 25 MG tablet TAKE 1 TABLET (25 MG TOTAL) BY MOUTH DAILY. 05/05/23  Yes Carlan, Chelsea L, NP  tiZANidine  (ZANAFLEX ) 2 MG tablet Take 2 mg by mouth 2 (two) times daily as needed for muscle spasms. 07/04/23  Yes [provider]     Vital Signs: BP (!) 135/53 (BP Location: Right Arm)   Pulse (!) 56   Temp 97.6 F (36.4 C) (Oral)   Resp 13   Ht 5\' 9"  (1.753 m)   Wt 198 lb (89.8 kg)   SpO2 96%   BMI 29.24 kg/m   Physical Exam Constitutional:  Appearance: Normal appearance.  Neck:     Comments: Right neck access site appropriately dressed. Site clean, dry, intact. No bruising or tenderness to palpation.  Cardiovascular:     Rate and Rhythm: Normal rate.     Pulses: Normal pulses.     Heart sounds: Normal heart sounds. No murmur heard. Pulmonary:     Effort: Pulmonary effort is normal.     Breath sounds: Normal breath sounds. No wheezing.  Abdominal:     General: Abdomen is flat.     Palpations: Abdomen is soft.     Tenderness: There is abdominal tenderness.  Genitourinary:    Comments: Foley  catheter present, draining appropriately. Musculoskeletal:        General: Normal range of motion.     Comments: Right groin access site appropriately dressed. Site clean, dry, intact. No bruising or tenderness to palpation.    Skin:    General: Skin is warm and dry.  Neurological:     Mental Status: He is alert and oriented to person, place, and time.      Labs:  CBC: Recent Labs    01/24/23 2259 03/18/23 1251 06/17/23 1429 07/13/23 0648  WBC 8.5 6.1 6.5 8.3  HGB 9.5* 11.6* 11.4* 10.7*  HCT 30.0* 37.6* 36.6* 34.1*  PLT 287 259 243 260    COAGS: Recent Labs    01/16/23 0410 03/18/23 1251 06/17/23 1429 07/13/23 0648  INR 2.3* 2.3* 1.2 1.1    BMP: Recent Labs    01/17/23 0452 01/19/23 1612 01/24/23 2259 03/18/23 1251 06/17/23 1429 07/13/23 0648  NA 135   < > 131* 139 143 141  K 3.6   < > 3.6 4.8 4.4 3.8  CL 102   < > 97* 98 101 102  CO2 24   < > 24 31 26 28   GLUCOSE 94   < > 106* 106* 83 97  BUN 31*   < > 49* 24* 27 35*  CALCIUM  8.0*   < > 7.8* 8.7* 8.6 8.5*  CREATININE 1.75*   < > 2.34* 1.75* 1.92* 1.80*  GFRNONAA 39*  --  27* 39*  --  38*   < > = values in this interval not displayed.    LIVER FUNCTION TESTS: Recent Labs    01/24/23 2259 03/18/23 1251 06/17/23 1429 07/13/23 0648  BILITOT 0.3 0.5 0.2 0.8  AST 23 28 16  32  ALT 29 31 20  37  ALKPHOS 72 83 127* 89  PROT 5.6* 6.1* 5.6* 5.9*  ALBUMIN  2.9* 2.9* 3.5* 2.7*    Assessment:  Caleb Wolf tolerating procedure well.  Plan: D/C foley catheter. Chart urine output per shift. Dressing changes QD or PRN if soiled. Will re-assess in AM. Caleb Wolf may sit up in bed and ambulate with assistance. Caleb Wolf may resume diet per Hospitalist directive. Ammonia level in AM.  Discharge planning: Please contact IR APP or on call IR MD prior to Caleb Wolf d/c to ensure appropriate follow up plans are in place. Typically Caleb Wolf will follow up with IR clinic 1 month post d/c for re-evaluation. IR scheduler  will contact Caleb Wolf with date/time of appointment.   IR will continue to follow - please call with questions or concerns.    Electronically Signed: Lovena Rubinstein, PA-C 07/13/2023, 5:09 PM     I spent a total of 15 Minutes at the the Caleb Wolf's bedside AND on the Caleb Wolf's hospital floor or unit, greater than 50% of which was counseling/coordinating care for decompensated cirrhosis with recurrent ascites  status post TIPS procedure in IR today.

## 2023-07-13 NOTE — Anesthesia Procedure Notes (Signed)
 Arterial Line Insertion Start/End2/03/2024 9:27 AM, 07/13/2023 9:27 AM Performed by: CRNA  Patient location: Pre-op . Preanesthetic checklist: patient identified, IV checked, site marked, risks and benefits discussed, surgical consent, monitors and equipment checked, pre-op  evaluation, timeout performed and anesthesia consent Lidocaine  1% used for infiltration Left, radial was placed Catheter size: 20 G Hand hygiene performed , maximum sterile barriers used  and Seldinger technique used Allen's test indicative of satisfactory collateral circulation Attempts: 1 Procedure performed without using ultrasound guided technique. Following insertion, Biopatch and dressing applied. Post procedure assessment: normal  Patient tolerated the procedure with difficulty.

## 2023-07-13 NOTE — Sedation Documentation (Addendum)
 Pre TIPS: Right Atrium 13 Main Portal Vein 15   Post TIPS: Right Atrium 15 Main Portal Vein 21

## 2023-07-13 NOTE — H&P (Signed)
 History and Physical    Patient: Caleb Wolf NUU:[id number] DOB: Feb 06, 1941 DOA: 07/13/2023 DOS: the patient was seen and examined on 07/13/2023 PCP: Artemisa Bile, MD  Patient coming from: Home  Chief Complaint: Abdominal swelling  HPI: Caleb Wolf is a 83 y.o. male with medical history significant of hypertension, hyperlipidemia, persistent atrial fibrillation s/p ablation on Xarelto , CAD s/p CABG, carotid stenosis, CHF, CKD stage IIIb, decompensated cirrhosis with recurrent ascites and hepatic encephalopathy, and AAA who presents for abdominal swelling.  Has planned TIPS procedure as well as paracentesis today with Dr. Art Largo.  He reports requiring a paracentesis every 3 weeks with this last done on 1/29/2025Patient had just recently had a CT scan of the abdomen pelvis on 07/04/2023 that noted patent portal system without evidence of gastroesophageal varices, cirrhosis with portal hypertension, small volume ascites, trace right hepatic hydrothorax, and new L1 anterior wedge fracture.  He had been advised to hold Xarelto  prior to the procedure with last dose reported on 2/4.   Labs this morning noted WBC 8.3, hemoglobin 10.7, platelets 260, BUN 35, creatinine 1.8, calcium  8.5, albumin  2.7, ammonia less than 10, total bilirubin 0.8, and INR 1.1.  Prior to the procedure patient had been given prophylaxis with 2 g of Rocephin .   Review of Systems: As mentioned in the history of present illness. All other systems reviewed and are negative. Past Medical History:  Diagnosis Date   AAA (abdominal aortic aneurysm) (HCC)    Arthritis    Basal cell carcinoma    BOOP (bronchiolitis obliterans with organizing pneumonia) (HCC) 2023   Carotid stenosis    Chronic kidney disease, stage 3b (HCC) 12/13/2021   Cirrhosis of liver not due to alcohol (HCC)    Coronary artery disease    Multvessel s/p CABG 2015   Enlarged prostate    Essential hypertension    GERD (gastroesophageal reflux disease)    Gout     History of colon polyps    History of hiatal hernia    History of kidney stones    Hyperlipidemia    Leukocytosis 01/21/2023   Lumbar disc disease    Myocardial infarction (HCC) 2015   Oxygen  dependent    at bedtime- 2L   Persistent atrial fibrillation (HCC)    8 cardioversions and 2 ablations   Sleep apnea    CPAP   Spinal stenosis    Past Surgical History:  Procedure Laterality Date   ABLATION OF DYSRHYTHMIC FOCUS  07/28/2017   ATRIAL FIBRILLATION ABLATION N/A 07/28/2017   Procedure: ATRIAL FIBRILLATION ABLATION;  Surgeon: Jolly Needle, MD;  Location: [facility]AS[facility];  Service: Cardiovascular;  Laterality: N/A;   ATRIAL FIBRILLATION ABLATION N/A 12/04/2020   Procedure: ATRIAL FIBRILLATION ABLATION;  Surgeon: Jolly Needle, MD;  Location: [facility] INVASIVE CV LAB;  Service: Cardiovascular;  Laterality: N/A;   BACK SURGERY     CARDIAC CATHETERIZATION  05/22/2014   Procedure: IABP INSERTION;  Surgeon: Arleen Lacer, MD;  Location: [facility] CATH LAB;  Service: Cardiovascular;;   CARDIOVERSION N/A 04/29/2017   Procedure: CARDIOVERSION;  Surgeon: Gerard Knight, MD;  Location: [facility] ENDO SUITE;  Service: Cardiovascular;  Laterality: N/A;   CARDIOVERSION N/A 08/13/2017   Procedure: CARDIOVERSION;  Surgeon: Hazle Lites, MD;  Location: [facility] ENDOSCOPY;  Service: Cardiovascular;  Laterality: N/A;   CARDIOVERSION N/A 08/15/2019   Procedure: CARDIOVERSION;  Surgeon: Liza Riggers, MD;  Location: [facility] ENDOSCOPY;  Service: Cardiovascular;  Laterality: N/A;   CARDIOVERSION N/A 03/09/2020  Procedure: CARDIOVERSION;  Surgeon: Sheryle Donning, MD;  Location: [facility] ENDOSCOPY;  Service: Cardiovascular;  Laterality: N/A;   CARDIOVERSION N/A 07/06/2020   Procedure: CARDIOVERSION;  Surgeon: Luana Rumple, MD;  Location: [facility] ENDOSCOPY;  Service: Cardiovascular;  Laterality: N/A;   CARDIOVERSION N/A 01/18/2021   Procedure: CARDIOVERSION;  Surgeon: Maudine Sos, MD;  Location: [facility] ENDOSCOPY;   Service: Cardiovascular;  Laterality: N/A;   CARDIOVERSION N/A 06/17/2021   Procedure: CARDIOVERSION;  Surgeon: Harrold Lincoln, MD;  Location: [facility] ENDOSCOPY;  Service: Cardiovascular;  Laterality: N/A;   Cataract surgery Bilateral    COLONOSCOPY     COLONOSCOPY N/A 12/30/2017   Procedure: COLONOSCOPY;  Surgeon: Ruby Corporal, MD;  Location: [facility];  Service: Endoscopy;  Laterality: N/A;   CORONARY ARTERY BYPASS GRAFT N/A 05/22/2014   Procedure: CORONARY ARTERY BYPASS GRAFTING (CABG) times three using left internal mammary and right saphenous vein.;  Surgeon: Zelphia Higashi, MD;  Location: [facility] OR;  Service: Open Heart Surgery;  Laterality: N/A;   ENDARTERECTOMY Left 02/17/2014   Procedure: ENDARTERECTOMY CAROTID WITH PATCH ANGIOPLASTY;  Surgeon: Palma Bob, MD;  Location[facility] OR;  Service: Vascular;  Laterality: Left;   ESOPHAGEAL DILATION N/A 08/22/2015   Procedure: ESOPHAGEAL DILATION;  Surgeon: Ruby Corporal, MD;  Location: [facility];  Service: Endoscopy;  Laterality: N/A;   ESOPHAGOGASTRODUODENOSCOPY N/A 08/22/2015   Procedure: ESOPHAGOGASTRODUODENOSCOPY (EGD);  Surgeon: Ruby Corporal, MD;  Location: [facility] ENDO [facility];  Service: Endoscopy;  Laterality: N/A;  12:45 - moved to 1:55 - Ann notified pt   ESOPHAGOGASTRODUODENOSCOPY N/A 12/30/2017   Procedure: ESOPHAGOGASTRODUODENOSCOPY (EGD);  Surgeon: Ruby Corporal, MD;  Location: [facility] ENDO [facility];  Service: Endoscopy;  Laterality: N/A;  200   EYE SURGERY     cataract extraction (right) with repair macular tear , with IOL     right   FRACTURE SURGERY     bilateral wrist fractures- one ORIF   IR RADIOLOGIST EVAL & MGMT  12/01/2022   IR RADIOLOGIST EVAL & MGMT  06/25/2023   JOINT REPLACEMENT  2011   left knee   LEFT HEART CATHETERIZATION WITH CORONARY ANGIOGRAM N/A 05/22/2014   Procedure: LEFT HEART CATHETERIZATION WITH CORONARY ANGIOGRAM;  Surgeon: Arleen Lacer, MD;  Location: [facility] CATH LAB;  Service:  Cardiovascular;  Laterality: N/A;   LITHOTRIPSY     PARS PLANA VITRECTOMY W/ REPAIR OF MACULAR HOLE     RHINOPLASTY     TEE WITHOUT CARDIOVERSION N/A 04/29/2017   Procedure: TRANSESOPHAGEAL ECHOCARDIOGRAM (TEE) WITH PROPOFOL ;  Surgeon: Gerard Knight, MD;  Location: [facility]O [facility]ITE;  Service: Cardiovascular;  Laterality: N/A;   TONSILLECTOMY     TOTAL HIP ARTHROPLASTY  12/08/2011   Procedure: TOTAL HIP ARTHROPLASTY;  Surgeon: Aurther Blue, MD;  Location: [facility] ORS;  Service: Orthopedics;  Laterality: Right;   UPPER GI ENDOSCOPY  12/18/2015   Procedure: UPPER GI ENDOSCOPY;  Surgeon: Shela Derby, MD;  Location: [facility] ORS;  Service: General;;   WRIST SURGERY Right 57yrs ago   WRIST SURGERY Left    Social History:  reports that he quit smoking about 41 years ago. His smoking use included cigarettes. He started smoking about 62 years ago. He has a 37.5 pack-year smoking history. He has never been exposed to tobacco smoke. He has never used smokeless tobacco. He reports that he does not drink alcohol and does not use drugs.  Allergies  Allergen Reactions   Codeine Sulfate Nausea Only    Family History  Problem  Relation Age of Onset   Allergies Mother    Heart disease Mother    Hypertension Mother    Heart disease Father        MI    Prior to Admission medications   Medication Sig Start Date End Date Taking? Authorizing Provider  acetaminophen  (TYLENOL ) 500 MG tablet Take 1,000 mg by mouth every 6 (six) hours as needed for moderate pain.   Yes [provider]  amiodarone  (PACERONE ) 200 MG tablet TAKE 1 TABLET BY MOUTH EVERY DAY 04/17/23  Yes Hugh Madura, MD  atorvastatin  (LIPITOR) 80 MG tablet TAKE 1 TABLET BY MOUTH EVERY DAY IN THE EVENING 04/17/23  Yes Hugh Madura, MD  Cholecalciferol  (VITAMIN D ) 50 MCG (2000 UT) CAPS Take 2,000 Units by mouth daily.   Yes [provider]  cyanocobalamin  (VITAMIN B12) 1000 MCG tablet Take 1,000 mcg by mouth 2 (two) times a  week.   Yes [provider]  doxazosin  (CARDURA ) 4 MG tablet Take 4 mg by mouth in the morning. 07/06/17  Yes [provider]  empagliflozin  (JARDIANCE ) 10 MG TABS tablet TAKE 1 TABLET BY MOUTH DAILY BEFORE BREAKFAST. 06/15/23  Yes Hugh Madura, MD  feeding supplement (BOOST HIGH PROTEIN) LIQD Take 1 Container by mouth daily in the afternoon.   Yes [provider]  finasteride  (PROSCAR ) 5 MG tablet Take 5 mg by mouth every evening.    Yes [provider]  fluticasone  (FLONASE ) 50 MCG/ACT nasal spray Place 1 spray into both nostrils daily as needed for allergies or rhinitis.   Yes [provider]  furosemide  (LASIX ) 80 MG tablet TAKE 1 TABLET BY MOUTH 2 TIMES DAILY. 06/04/23  Yes Hugh Madura, MD  lactulose  (CHRONULAC ) 10 GM/15ML solution Take 67.5 mLs (45 g total) by mouth 3 (three) times daily. Titrate dosage of lactulose  to 2-3 soft bowels per day Patient taking differently: Take 40 g by mouth 2 (two) times daily. 03/17/23  Yes Carlan, Chelsea L, NP  nitroGLYCERIN  (NITROSTAT ) 0.4 MG SL tablet Place 1 tablet (0.4 mg total) under the tongue every 5 (five) minutes x 3 doses as needed for chest pain. 06/04/20  Yes Allred, Royston Cornea, MD  pantoprazole  (PROTONIX ) 40 MG tablet Take 1 tablet (40 mg total) by mouth daily before breakfast. 02/19/16  Yes Rehman, Mathews Solomons, MD  Rivaroxaban  (XARELTO ) 15 MG TABS tablet Take 1 tablet (15 mg total) by mouth daily with supper. 12/16/21  Yes Tat, Myrtie Atkinson, MD  spironolactone  (ALDACTONE ) 25 MG tablet TAKE 1 TABLET (25 MG TOTAL) BY MOUTH DAILY. 05/05/23  Yes Carlan, Chelsea L, NP  tiZANidine  (ZANAFLEX ) 2 MG tablet Take 2 mg by mouth 2 (two) times daily as needed for muscle spasms. 07/04/23  Yes [provider]    Physical Exam: Vitals:   07/10/23 0933 07/13/23 0656 07/13/23 0712  SpO2:  98%   Weight: 83.5 kg  89.8 kg  Height: 5\' 9"  (1.753 m)  5\' 9"  (1.753 m)    Constitutional: Elderly male currently in no acute  distress. Eyes: PERRL, lids and conjunctivae normal ENMT: Mucous membranes are moist.  Normal dentition.  Neck: normal, supple, no JVD. Respiratory: clear to auscultation bilaterally, no wheezing, no crackles. Normal respiratory effort. No accessory muscle use.  Cardiovascular: Regular rate and rhythm, no murmurs / rubs / gallops.  Trace left lower extremity edema.  2+ pedal pulses.   Abdomen: Distended abdomen with positive fluid wave.  No tenderness to palpation appreciated.  Bowel sounds present in  all 4 quadrants. Musculoskeletal: no clubbing / cyanosis. No joint deformity upper and lower extremities. Good ROM, no contractures. Normal muscle tone.  Skin: no rashes, lesions, ulcers. No induration Neurologic: CN 2-12 grossly intact. Strength 5/5 in all 4.  Psychiatric: Normal judgment and insight. Alert and oriented x 3. Normal mood.   Data Reviewed:  Reviewed labs, imaging, and pertinent records as documented.  Assessment and Plan:  Decompensated cirrhosis Patient has a history of decompensated cirrhosis with recurrent ascites and hepatic encephalopathy who presented for TIPS procedure along with paracentesis with Dr. Darylene Epley of interventional radiology.  He had been treated with Rocephin  IV perioperatively. -Admit to a medical telemetry bed -Monitor intake and output -Continue lactulose , furosemide , and spironolactone  -Appreciate IR consultative services we will follow-up with further recommendations  Paroxysmal atrial fibrillation on chronic anticoagulation Patient appears to be in sinus rhythm at this time. -Continue amiodarone  -Resume Xarelto  tomorrow evening as recommended by IR  Hyperlipidemia -Continue atorvastatin   BPH -Continue doxazosin  and finasteride   GERD -Continue Protonix   DVT prophylaxis:  Advance Care Planning:   Code Status: Full Code   Consults: IR  Family Communication: None  Severity of Illness: The appropriate patient status for this patient is  INPATIENT. Inpatient status is judged to be reasonable and necessary in order to provide the required intensity of service to ensure the patient's safety. The patient's presenting symptoms, physical exam findings, and initial radiographic and laboratory data in the context of their chronic comorbidities is felt to place them at high risk for further clinical deterioration. Furthermore, it is not anticipated that the patient will be medically stable for discharge from the hospital within 2 midnights of admission.   * I certify that at the point of admission it is my clinical judgment that the patient will require inpatient hospital care spanning beyond 2 midnights from the point of admission due to high intensity of service, high risk for further deterioration and high frequency of surveillance required.*  Author: Lena Qualia, MD 07/13/2023 8:18 AM  For on call review [email or web address].

## 2023-07-13 NOTE — Anesthesia Procedure Notes (Signed)
 Procedure Name: Intubation Date/Time: 07/13/2023 9:26 AM  Performed by: Hebert Littler, CRNAPre-anesthesia Checklist: Patient identified, Emergency Drugs available, Suction available, Patient being monitored and Timeout performed Patient Re-evaluated:Patient Re-evaluated prior to induction Oxygen  Delivery Method: Circle system utilized Preoxygenation: Pre-oxygenation with 100% oxygen  Induction Type: IV induction Ventilation: Mask ventilation without difficulty Laryngoscope Size: Mac and 3 Grade View: Grade I Tube type: Oral Tube size: 7.5 mm Number of attempts: 1 Airway Equipment and Method: Stylet and Patient positioned with wedge pillow Placement Confirmation: ETT inserted through vocal cords under direct vision, CO2 detector, breath sounds checked- equal and bilateral and positive ETCO2 Secured at: 22 cm Tube secured with: Tape

## 2023-07-13 NOTE — Transfer of Care (Signed)
 Immediate Anesthesia Transfer of Care Note  Patient: Caleb Wolf  Procedure(s) Performed: TRANS-JUGULAR INTRAHEPATIC PORTAL SHUNT (TIPS)  Patient Location: PACU  Anesthesia Type:General  Level of Consciousness: awake, alert , oriented, patient cooperative, and responds to stimulation  Airway & Oxygen  Therapy: Patient Spontanous Breathing and Patient connected to face mask oxygen   Post-op Assessment: Report given to RN, Post -op Vital signs reviewed and stable, Patient moving all extremities, Patient moving all extremities X 4, and Patient able to stick tongue midline  Post vital signs: Reviewed and stable  Last Vitals:  Vitals Value Taken Time  BP 133/56 07/13/23 1204  Temp    Pulse 57 07/13/23 1208  Resp 16 07/13/23 1208  SpO2 99 % 07/13/23 1208  Vitals shown include unfiled device data.  Last Pain:  Vitals:   07/13/23 0656  PainSc: 0-No pain      Patients Stated Pain Goal: 0 (07/13/23 0656)  Complications: No notable events documented.

## 2023-07-14 ENCOUNTER — Encounter (HOSPITAL_COMMUNITY): Payer: Self-pay | Admitting: Interventional Radiology

## 2023-07-14 ENCOUNTER — Other Ambulatory Visit: Payer: Self-pay

## 2023-07-14 ENCOUNTER — Inpatient Hospital Stay (HOSPITAL_COMMUNITY): Payer: Medicare HMO

## 2023-07-14 ENCOUNTER — Other Ambulatory Visit: Payer: Self-pay | Admitting: Interventional Radiology

## 2023-07-14 DIAGNOSIS — I6523 Occlusion and stenosis of bilateral carotid arteries: Secondary | ICD-10-CM

## 2023-07-14 DIAGNOSIS — K7460 Unspecified cirrhosis of liver: Secondary | ICD-10-CM | POA: Diagnosis not present

## 2023-07-14 DIAGNOSIS — K7290 Hepatic failure, unspecified without coma: Secondary | ICD-10-CM | POA: Diagnosis not present

## 2023-07-14 LAB — CBC
HCT: 31.7 % — ABNORMAL LOW (ref 39.0–52.0)
Hemoglobin: 10.1 g/dL — ABNORMAL LOW (ref 13.0–17.0)
MCH: 29.8 pg (ref 26.0–34.0)
MCHC: 31.9 g/dL (ref 30.0–36.0)
MCV: 93.5 fL (ref 80.0–100.0)
Platelets: 234 10*3/uL (ref 150–400)
RBC: 3.39 MIL/uL — ABNORMAL LOW (ref 4.22–5.81)
RDW: 14.6 % (ref 11.5–15.5)
WBC: 10.3 10*3/uL (ref 4.0–10.5)
nRBC: 0 % (ref 0.0–0.2)

## 2023-07-14 LAB — COMPREHENSIVE METABOLIC PANEL
ALT: 37 U/L (ref 0–44)
AST: 30 U/L (ref 15–41)
Albumin: 2.6 g/dL — ABNORMAL LOW (ref 3.5–5.0)
Alkaline Phosphatase: 82 U/L (ref 38–126)
Anion gap: 10 (ref 5–15)
BUN: 37 mg/dL — ABNORMAL HIGH (ref 8–23)
CO2: 26 mmol/L (ref 22–32)
Calcium: 8.5 mg/dL — ABNORMAL LOW (ref 8.9–10.3)
Chloride: 102 mmol/L (ref 98–111)
Creatinine, Ser: 1.99 mg/dL — ABNORMAL HIGH (ref 0.61–1.24)
GFR, Estimated: 33 mL/min — ABNORMAL LOW (ref >60)
Glucose, Bld: 122 mg/dL — ABNORMAL HIGH (ref 70–99)
Potassium: 4.1 mmol/L (ref 3.5–5.1)
Sodium: 138 mmol/L (ref 135–145)
Total Bilirubin: 0.7 mg/dL (ref 0.0–1.2)
Total Protein: 5.7 g/dL — ABNORMAL LOW (ref 6.5–8.1)

## 2023-07-14 LAB — AMMONIA: Ammonia: 24 umol/L (ref 9–35)

## 2023-07-14 NOTE — Anesthesia Postprocedure Evaluation (Signed)
Anesthesia Post Note  Patient: Caleb Wolf  Procedure(s) Performed: TRANS-JUGULAR INTRAHEPATIC PORTAL SHUNT (TIPS)     Patient location during evaluation: PACU Anesthesia Type: General Level of consciousness: awake Pain management: pain level controlled Vital Signs Assessment: post-procedure vital signs reviewed and stable Respiratory status: spontaneous breathing, nonlabored ventilation and respiratory function stable Cardiovascular status: blood pressure returned to baseline and stable Postop Assessment: no apparent nausea or vomiting Anesthetic complications: no   No notable events documented.  Last Vitals:  Vitals:   07/13/23 2345 07/14/23 0553  BP: (!) 127/56 115/68  Pulse: (!) 53 (!) 53  Resp: 16 18  Temp: (!) 36.4 C 36.8 C  SpO2: 98% 97%    Last Pain:  Vitals:   07/14/23 0553  TempSrc: Oral  PainSc:                  Catheryn Bacon Reznor Ferrando

## 2023-07-14 NOTE — Progress Notes (Signed)
Referring Provider(s): Primary Care; Carylon Perches, MD Gastroenterology;  Dolores Frame, MD and Raquel James, NP  Supervising Physician: Irish Lack  Patient Status:  [facility] - In-pt  Chief Complaint: Decompensated cirrhosis with recurrent ascites status post TIPS procedure in IR today.    Subjective: Patient is alert and resting comfortably in bed on rounds this morning. Wife remains at his side. Currently without any significant complaints.  No abdominal tenderness this morning. Patient further denies fevers, headache, chest pain, SOB, cough, nausea, vomiting or bleeding.    Allergies: Codeine sulfate  Medications: Prior to Admission medications   Medication Sig Start Date End Date Taking? Authorizing Provider  acetaminophen (TYLENOL) 500 MG tablet Take 1,000 mg by mouth every 6 (six) hours as needed for moderate pain.   Yes [provider]  amiodarone (PACERONE) 200 MG tablet TAKE 1 TABLET BY MOUTH EVERY DAY 04/17/23  Yes Jake Bathe, MD  atorvastatin (LIPITOR) 80 MG tablet TAKE 1 TABLET BY MOUTH EVERY DAY IN THE EVENING 04/17/23  Yes Jake Bathe, MD  Cholecalciferol (VITAMIN D) 50 MCG (2000 UT) CAPS Take 2,000 Units by mouth daily.   Yes [provider]  cyanocobalamin (VITAMIN B12) 1000 MCG tablet Take 1,000 mcg by mouth 2 (two) times a week.   Yes [provider]  doxazosin (CARDURA) 4 MG tablet Take 4 mg by mouth in the morning. 07/06/17  Yes [provider]  empagliflozin (JARDIANCE) 10 MG TABS tablet TAKE 1 TABLET BY MOUTH DAILY BEFORE BREAKFAST. 06/15/23  Yes Jake Bathe, MD  feeding supplement (BOOST HIGH PROTEIN) LIQD Take 1 Container by mouth daily in the afternoon.   Yes [provider]  finasteride (PROSCAR) 5 MG tablet Take 5 mg by mouth every evening.    Yes [provider]  fluticasone (FLONASE) 50 MCG/ACT nasal spray Place 1 spray into both nostrils daily as needed for allergies or  rhinitis.   Yes [provider]  furosemide (LASIX) 80 MG tablet TAKE 1 TABLET BY MOUTH 2 TIMES DAILY. 06/04/23  Yes Jake Bathe, MD  lactulose (CHRONULAC) 10 GM/15ML solution Take 67.5 mLs (45 g total) by mouth 3 (three) times daily. Titrate dosage of lactulose to 2-3 soft bowels per day Patient taking differently: Take 40 g by mouth 2 (two) times daily. 03/17/23  Yes Carlan, Chelsea L, NP  nitroGLYCERIN (NITROSTAT) 0.4 MG SL tablet Place 1 tablet (0.4 mg total) under the tongue every 5 (five) minutes x 3 doses as needed for chest pain. 06/04/20  Yes Allred, Fayrene Fearing, MD  pantoprazole (PROTONIX) 40 MG tablet Take 1 tablet (40 mg total) by mouth daily before breakfast. 02/19/16  Yes Rehman, Joline Maxcy, MD  Rivaroxaban (XARELTO) 15 MG TABS tablet Take 1 tablet (15 mg total) by mouth daily with supper. 12/16/21  Yes Tat, Onalee Hua, MD  spironolactone (ALDACTONE) 25 MG tablet TAKE 1 TABLET (25 MG TOTAL) BY MOUTH DAILY. 05/05/23  Yes Carlan, Chelsea L, NP  tiZANidine (ZANAFLEX) 2 MG tablet Take 2 mg by mouth 2 (two) times daily as needed for muscle spasms. 07/04/23  Yes [provider]     Vital Signs: BP (!) 124/52 (BP Location: Left Arm)   Pulse (!) 50   Temp (!) 97.5 F (36.4 C) (Oral)   Resp 13   Ht 5\' 9"  (1.753 m)   Wt 198 lb (89.8 kg)   SpO2 97%   BMI 29.24 kg/m   Physical Exam Constitutional:  Appearance: Normal appearance.  Neck:     Comments: Right neck access site appropriately dressed. Site clean, dry, intact. No bruising or tenderness to palpation.  Cardiovascular:     Rate and Rhythm: Normal rate and regular rhythm.  Pulmonary:     Effort: Pulmonary effort is normal.     Breath sounds: Normal breath sounds.  Abdominal:     General: There is distension.     Palpations: Abdomen is soft.     Tenderness: There is no abdominal tenderness.  Musculoskeletal:     Comments: Right groin access site appropriately dressed. Site clean, dry, intact. No bruising or tenderness  to palpation.   Skin:    General: Skin is warm and dry.  Neurological:     Mental Status: He is alert and oriented to person, place, and time.      Labs:  CBC: Recent Labs    03/18/23 1251 06/17/23 1429 07/13/23 0648 07/14/23 0631  WBC 6.1 6.5 8.3 10.3  HGB 11.6* 11.4* 10.7* 10.1*  HCT 37.6* 36.6* 34.1* 31.7*  PLT 259 243 260 234    COAGS: Recent Labs    01/16/23 0410 03/18/23 1251 06/17/23 1429 07/13/23 0648  INR 2.3* 2.3* 1.2 1.1    BMP: Recent Labs    01/24/23 2259 03/18/23 1251 06/17/23 1429 07/13/23 0648 07/14/23 0631  NA 131* 139 143 141 138  K 3.6 4.8 4.4 3.8 4.1  CL 97* 98 101 102 102  CO2 24 31 26 28 26   GLUCOSE 106* 106* 83 97 122*  BUN 49* 24* 27 35* 37*  CALCIUM 7.8* 8.7* 8.6 8.5* 8.5*  CREATININE 2.34* 1.75* 1.92* 1.80* 1.99*  GFRNONAA 27* 39*  --  38* 33*    LIVER FUNCTION TESTS: Recent Labs    03/18/23 1251 06/17/23 1429 07/13/23 0648 07/14/23 0631  BILITOT 0.5 0.2 0.8 0.7  AST 28 16 32 30  ALT 31 20 37 37  ALKPHOS 83 127* 89 82  PROT 6.1* 5.6* 5.9* 5.7*  ALBUMIN 2.9* 3.5* 2.7* 2.6*    Assessment and Plan: Dressing change today. May place bandaids. Patient may sit up in bed and ambulate with assistance per nursing protocol.  Ammonia level today within normal limit. Patient's abdomen is more distended this morning, without tenderness. Will proceed with US Abdomen limited for ascites check prior to discharge. If indicated, will attempt paracentesis while inpatient. Otherwise, will schedule for outpatient.  Discharge planning:  - Return in 1 month to outpatient clinic for TIPS follow up with Dr. Milford Cage,. - Follow-up in 6 month at outpatient clinic with [place] TIPS doppler.  - Scheduler will call patient to arrange both appointments.   IR will continue to follow - please call with questions or concerns.    Electronically Signed: Sable Feil, PA-C 07/14/2023, 9:38 AM     I spent a total of 15 Minutes at the the  patient's bedside AND on the patient's hospital floor or unit, greater than 50% of which was counseling/coordinating care for decompensated cirrhosis with recurrent ascites status post TIPS procedure.

## 2023-07-14 NOTE — Plan of Care (Signed)

## 2023-07-14 NOTE — Discharge Instructions (Signed)
Please schedule an appointment with Caleb Wolf for paracentesis if your abdomen becomes more distended.

## 2023-07-15 ENCOUNTER — Encounter (INDEPENDENT_AMBULATORY_CARE_PROVIDER_SITE_OTHER): Payer: Self-pay

## 2023-07-15 ENCOUNTER — Telehealth: Payer: Self-pay

## 2023-07-15 NOTE — Transitions of Care (Post Inpatient/ED Visit) (Signed)
07/15/2023  Name: Caleb Wolf MRN: [id number] DOB: 06-Feb-1941  Today's TOC FU Call Status: Today's TOC FU Call Status:: Successful TOC FU Call Completed TOC FU Call Complete Date: 07/15/23 Patient's Name and Date of Birth confirmed.  Transition Care Management Follow-up Telephone Call Date of Discharge: 07/15/23 Discharge Facility: [facility]) Type of Discharge: Inpatient Admission Primary Inpatient Discharge Diagnosis:: Decompensated cirrhosis How have you been since you were released from the hospital?: Better Any questions or concerns?: Yes Patient Questions/Concerns:: diet recommendations Patient Questions/Concerns Addressed: Other:  Items Reviewed: Did you receive and understand the discharge instructions provided?: Yes Medications obtained,verified, and reconciled?: Yes (Medications Reviewed) Any new allergies since your discharge?: No Dietary orders reviewed?: Yes Type of Diet Ordered:: Reg Renal, Heart Healthy Do you have support at home?: Yes People in Home: spouse Name of Support/Comfort Primary Source: spouse  Caleb Wolf  Medications Reviewed Today: Medications Reviewed Today     Reviewed by Johnnette Barrios, RN (Registered Nurse) on 07/15/23 at 1247  Med List Status: <None>   Medication Order Taking? Sig Documenting Provider Last Dose Status Informant  acetaminophen (TYLENOL) 500 MG tablet [id number] Yes Take 1,000 mg by mouth every 6 (six) hours as needed for moderate pain. [provider] Taking Active Spouse/Significant Other  amiodarone (PACERONE) 200 MG tablet [id number] Yes TAKE 1 TABLET BY MOUTH EVERY DAY Jake Bathe, MD Taking Active Spouse/Significant Other  atorvastatin (LIPITOR) 80 MG tablet [id number] Yes TAKE 1 TABLET BY MOUTH EVERY DAY IN THE EVENING Skains, Veverly Fells, MD Taking Active Spouse/Significant Other  Cholecalciferol (VITAMIN D) 50 MCG (2000 UT) CAPS [id number] Yes Take 2,000 Units by mouth daily. [provider] Taking  Active Spouse/Significant Other  cyanocobalamin (VITAMIN B12) 1000 MCG tablet [id number] Yes Take 1,000 mcg by mouth 2 (two) times a week. [provider] Taking Active Spouse/Significant Other  doxazosin (CARDURA) 4 MG tablet [id number] Yes Take 4 mg by mouth in the morning. [provider] Taking Active Spouse/Significant Other  empagliflozin (JARDIANCE) 10 MG TABS tablet [id number] Yes TAKE 1 TABLET BY MOUTH DAILY BEFORE BREAKFAST. Jake Bathe, MD Taking Active Spouse/Significant Other  feeding supplement (BOOST HIGH PROTEIN) LIQD [id number] Yes Take 1 Container by mouth daily in the afternoon. [provider] Taking Active Spouse/Significant Other           Med Note (Ahmani Prehn L   Wed Jul 15, 2023 12:45 PM) Uses protein  supplement   finasteride (PROSCAR) 5 MG tablet [id number] Yes Take 5 mg by mouth every evening.  [provider] Taking Active Spouse/Significant Other  fluticasone (FLONASE) 50 MCG/ACT nasal spray [id number] Yes Place 1 spray into both nostrils daily as needed for allergies or rhinitis. [provider] Taking Active Spouse/Significant Other  furosemide (LASIX) 80 MG tablet [id number] Yes TAKE 1 TABLET BY MOUTH 2 TIMES DAILY. Jake Bathe, MD Taking Active Spouse/Significant Other  lactulose (CHRONULAC) 10 GM/15ML solution [id number] Yes Take 67.5 mLs (45 g total) by mouth 3 (three) times daily. Titrate dosage of lactulose to 2-3 soft bowels per day  Patient taking differently: Take 40 g by mouth 2 (two) times daily.   Raquel James, NP Taking Active Spouse/Significant Other           Med Note (Daeshon Grammatico L   Wed Jul 15, 2023 12:44 PM) 60 ml 2 x day   nitroGLYCERIN (NITROSTAT) 0.4 MG SL tablet [id number] Yes Place 1 tablet (0.4 mg total) under the tongue every 5 (five)  minutes x 3 doses as needed for chest pain. Hillis Range, MD Taking Active Spouse/Significant Other  pantoprazole (PROTONIX) 40 MG tablet [id number] Yes Take 1  tablet (40 mg total) by mouth daily before breakfast. Rehman, Joline Maxcy, MD Taking Active Spouse/Significant Other  Rivaroxaban (XARELTO) 15 MG TABS tablet [id number] Yes Take 1 tablet (15 mg total) by mouth daily with supper. Catarina Hartshorn, MD Taking Active Spouse/Significant Other  spironolactone (ALDACTONE) 25 MG tablet [id number] Yes TAKE 1 TABLET (25 MG TOTAL) BY MOUTH DAILY. Raquel James, NP Taking Active Spouse/Significant Other  tiZANidine (ZANAFLEX) 2 MG tablet [id number] Yes Take 2 mg by mouth 2 (two) times daily as needed for muscle spasms. [provider] Taking Active Spouse/Significant Other          Medication reconciliation / review completed based on most recent discharge summary and EHR medication list. Confirmed patient is taking all newly prescribed medications as instructed (any discrepancies are noted in review section)   Patient / Caregiver is aware of any changes to and / or  any dosage adjustments to medication regimen. Patient/ Caregiver denies questions at this time and reports no barriers to medication adherence.   Home Care and Equipment/Supplies: Were Home Health Services Ordered?: No Any new equipment or medical supplies ordered?: No  Functional Questionnaire: Do you need assistance with bathing/showering or dressing?: Yes Do you need assistance with meal preparation?: Yes Do you need assistance with eating?: No Do you have difficulty maintaining continence: Yes (uses incontinence products) Do you need assistance with getting out of bed/getting out of a chair/moving?: Yes (transfer assist) Do you have difficulty managing or taking your medications?: Yes (Spouse manages medications)  Follow up appointments reviewed: PCP Follow-up appointment confirmed?: Yes Date of PCP follow-up appointment?: 07/20/23 Follow-up Provider: Dr [facility] Follow-up appointment confirmed?: No Reason Specialist Follow-Up Not Confirmed: Patient has  Specialist Provider Number and will Call for Appointment (IR in 2 weeks) Do you need transportation to your follow-up appointment?: No (Spouse drives) Do you understand care options if your condition(s) worsen?: Yes-patient verbalized understanding  SDOH Interventions Today    Flowsheet Row Most Recent Value  SDOH Interventions   Food Insecurity Interventions Intervention Not Indicated  Housing Interventions Intervention Not Indicated  Transportation Interventions Intervention Not Indicated, Patient Resources (Friends/Family)  Utilities Interventions Intervention Not Indicated      Interventions Today    Flowsheet Row Most Recent Value  General Interventions   General Interventions Discussed/Reviewed General Interventions Discussed, Durable Medical Equipment (DME), Doctor Visits, General Interventions Reviewed, Level of Care  Doctor Visits Discussed/Reviewed PCP, Specialist  Durable Medical Equipment (DME) Bed side commode, BP Cuff, Walker, Wheelchair, Physicist, medical  PCP/Specialist Visits Compliance with follow-up visit  Level of Care Personal Care Services  Exercise Interventions   Exercise Discussed/Reviewed Physical Activity  Physical Activity Discussed/Reviewed Physical Activity Reviewed  Mental Health Interventions   Mental Health Discussed/Reviewed Coping Strategies  Nutrition Interventions   Nutrition Discussed/Reviewed Nutrition Discussed, Nutrition Reviewed, Portion sizes, Fluid intake, Increasing proteins, Adding fruits and vegetables, Decreasing fats, Decreasing salt, Supplemental nutrition  Pharmacy Interventions   Pharmacy Dicussed/Reviewed Medications and their functions  Safety Interventions   Safety Discussed/Reviewed Fall Risk, Safety Reviewed       Benefits reviewed  Based on current information and Insurance plan -Reviewed benefits accessible to patient, including details about eligibility options for care and  available value based care  options  if any areas of needs were identified.  Reviewed patient/  caregiver's ability to access and / or  ability with navigating the benefits system..Amb Referral made if indicted , refer to orders section of note for details   Reviewed goals for care Patient/ Caregiver  verbalizes understanding of instructions and care plan provided. Patient / Caregiver was encouraged to make informed decisions about their care, actively participate in managing their health condition, and implement lifestyle changes as needed to promote independence and self-management of health care. There were no reported  barriers to care.   TOC program  Patient is at high risk for readmission and/or has history of  high utilization  Discussed VBCI  TOC program and weekly calls to patient to assess condition/status, medication management  and provide support/education as indicated . Patient/ Caregiver voiced understanding and declined enrollment in the 30-day [facility].   He is currently and has been followed by [facility] SN /PT and OT since Sept 2024 Nurse visit scheduled for today. She has a pvt Caregiver 2-3 days a week to assist with ADL's Spouse feels they did not need additional support      The patient has been provided with contact information for the care management team and has been advised to call with any health-related questions or concerns. Follow up as indicated with Care Team , or sooner should any new problems arise.    Susa Loffler , BSN, RN [facility]   VBCI-Population Health RN Care Manager Direct Dial [phone]  Fax: [phone] Website: [email or web address]

## 2023-07-16 NOTE — Discharge Summary (Signed)
Physician Discharge Summary   Patient: Caleb Wolf MRN: [id number] DOB: 1940-09-06  Admit date:     07/13/2023  Discharge date: {dischdate:26783}  Discharge Physician: Marolyn Haller   PCP: Carylon Perches, MD   Recommendations at discharge:  {Tip this will not be part of the note when signed- Example include specific recommendations for outpatient follow-up, pending tests to follow-up on. (Optional):26781}  ***  Discharge Diagnoses: Principal Problem:   Decompensated cirrhosis (HCC) Active Problems:   Paroxysmal atrial fibrillation (HCC)   Hyperlipidemia   BPH (benign prostatic hyperplasia)   GERD (gastroesophageal reflux disease)  Resolved Problems:   * No resolved hospital problems. [facility] Course: No notes on file  Assessment and Plan: No notes have been filed under this hospital service. Service: Hospitalist     {Tip this will not be part of the note when signed Body mass index is 29.24 kg/m. , ,  (Optional):26781}  {(NOTE) Pain control PDMP Statment (Optional):26782} Consultants: *** Procedures performed: ***  Disposition: {Plan; Disposition:26390} Diet recommendation:  {Diet_Plan:26776} DISCHARGE MEDICATION: Allergies as of 07/14/2023       Reactions   Codeine Sulfate Nausea Only        Medication List     TAKE these medications    acetaminophen 500 MG tablet Commonly known as: TYLENOL Take 1,000 mg by mouth every 6 (six) hours as needed for moderate pain.   amiodarone 200 MG tablet Commonly known as: PACERONE TAKE 1 TABLET BY MOUTH EVERY DAY   atorvastatin 80 MG tablet Commonly known as: LIPITOR TAKE 1 TABLET BY MOUTH EVERY DAY IN THE EVENING   cyanocobalamin 1000 MCG tablet Commonly known as: VITAMIN B12 Take 1,000 mcg by mouth 2 (two) times a week.   doxazosin 4 MG tablet Commonly known as: CARDURA Take 4 mg by mouth in the morning.   feeding supplement Liqd Take 1 Container by mouth daily in the afternoon.   finasteride 5 MG  tablet Commonly known as: PROSCAR Take 5 mg by mouth every evening.   fluticasone 50 MCG/ACT nasal spray Commonly known as: FLONASE Place 1 spray into both nostrils daily as needed for allergies or rhinitis.   furosemide 80 MG tablet Commonly known as: LASIX TAKE 1 TABLET BY MOUTH 2 TIMES DAILY.   Jardiance 10 MG Tabs tablet Generic drug: empagliflozin TAKE 1 TABLET BY MOUTH DAILY BEFORE BREAKFAST.   lactulose 10 GM/15ML solution Commonly known as: CHRONULAC Take 67.5 mLs (45 g total) by mouth 3 (three) times daily. Titrate dosage of lactulose to 2-3 soft bowels per day What changed:  how much to take when to take this additional instructions   nitroGLYCERIN 0.4 MG SL tablet Commonly known as: NITROSTAT Place 1 tablet (0.4 mg total) under the tongue every 5 (five) minutes x 3 doses as needed for chest pain.   pantoprazole 40 MG tablet Commonly known as: PROTONIX Take 1 tablet (40 mg total) by mouth daily before breakfast.   Rivaroxaban 15 MG Tabs tablet Commonly known as: XARELTO Take 1 tablet (15 mg total) by mouth daily with supper.   spironolactone 25 MG tablet Commonly known as: ALDACTONE TAKE 1 TABLET (25 MG TOTAL) BY MOUTH DAILY.   tiZANidine 2 MG tablet Commonly known as: ZANAFLEX Take 2 mg by mouth 2 (two) times daily as needed for muscle spasms.   Vitamin D 50 MCG (2000 UT) Caps Take 2,000 Units by mouth daily.        Follow-up Information     Mugweru, Cletis Athens,  MD Follow up in 2 week(s).   Specialties: Interventional Radiology, Diagnostic Radiology, Radiology Why: Clinic will call you to schedule follow-up appointment Contact information: [address] [place] [address] [phone]                Discharge Exam: Ceasar Mons Weights   07/10/23 0933 07/13/23 0712  Weight: 83.5 kg 89.8 kg   ***  Condition at discharge: {DC Condition:26389}  The results of significant diagnostics from this hospitalization (including imaging,  microbiology, ancillary and laboratory) are listed below for reference.   Imaging Studies: US Abdomen Limited Result Date: 07/14/2023 CLINICAL DATA:  Ascites EXAM: LIMITED ABDOMEN ULTRASOUND FOR ASCITES TECHNIQUE: Limited ultrasound survey for ascites was performed in all four abdominal quadrants. COMPARISON:  06/26/2023 FINDINGS: Small-moderate volume ascites is present in all 4 quadrants. Incidental note of a right pleural effusion. IMPRESSION: 1. Small-moderate volume ascites. 2. Right pleural effusion. Electronically Signed   By: Duanne Guess D.O.   On: 07/14/2023 14:12   DG Chest Port 1 View Result Date: 07/14/2023 CLINICAL DATA:  [id number] Post-operative state [id number] EXAM: PORTABLE CHEST 1 VIEW COMPARISON:  Chest XR, 07/02/2023. CT chest, 12/10/2021 and 11/27/2020. FINDINGS: Borderline enlargement of the cardiac silhouette. Aortic arch atherosclerosis. Coronary bypass change. Lungs are mildly hypoinflated with trace perihilar linear opacities. No focal consolidation. No pleural effusion or pneumothorax. TIPS stent, partially visualized of the RIGHT quadrant. Overlying cutaneous leads. Median sternotomy change. No acute osseous abnormality. IMPRESSION: Cardiomegaly and hypoinflation. No acute superimposed cardiopulmonary process. Electronically Signed   By: Roanna Banning [name]   On: 07/14/2023 08:41   IR Tips Result Date: 07/13/2023 CLINICAL DATA:  TIPS due to portal hypertension Briefly, 83 year old male with a history of decompensated EtOH cirrhosis with refractory ascites. Meld 15 (06/17/2023).  Child Pugh class B, 9 points. EXAM: Procedures: 1. TRANSJUGULAR INTRAHEPATIC PORTOSYSTEMIC SHUNT (TIPS) 2. INTRACARDIAC ECHOCARDIOGRAPHY (ICE) 3. LIMITED ABDOMINAL ULTRASOUND MEDICATIONS: As antibiotic prophylaxis, Rocephin 2 gm IV was ordered pre-procedure and administered intravenously within one hour of incision. ANESTHESIA/SEDATION: Sedation by the Anesthesia Team was performed. Please see anesthesiology  log for details. CONTRAST:  70 mL Omnipaque 300, intravenous FLUOROSCOPY TIME:  Fluoroscopic dose; 267 mGy COMPLICATIONS: None immediate. PROCEDURE: Informed written consent was obtained from the patient and/or patient's representative after a thorough discussion of the procedural risks, benefits and alternatives. All questions were addressed. Maximal Sterile Barrier Technique was utilized including caps, mask, sterile gowns, sterile gloves, sterile drape, hand hygiene and skin antiseptic. A timeout was performed prior to the initiation of the procedure. LIMITED ABDOMINAL ULTRASOUND Initial ultrasound scanning demonstrates a trace volume of ascites within the abdomen. Paracentesis was not performed. INTRACARDIAC ECHOCARDIOGRAPHY (ICE) A preliminary ultrasound of the RIGHT groin was performed and demonstrates a patent greater saphenous vein. A permanent ultrasound image was recorded. Using a combination of fluoroscopy and ultrasound, an access site was determined. A small dermatotomy was made at the planned puncture site. Using ultrasound guidance, access into the RIGHT greater saphenous vein was obtained with visualization of needle entry into the vessel using a standard micropuncture technique. A wire was advanced into the IVC insert all fascial dilation performed. An 8 Fr, 10 cm vascular sheath was placed into the external iliac vein. Through this access site, an 8 Fr AcuNav ICE catheter was advanced with ease under fluoroscopic guidance to the level of the intrahepatic inferior vena cava. TRANSJUGULAR INTRAHEPATIC PORTOSYSTEMIC SHUNT (TIPS) A preliminary ultrasound of the RIGHT neck was performed and demonstrates a patent internal  jugular vein. A permanent ultrasound image was recorded. Using a combination of fluoroscopy and ultrasound, an access site was determined. A small dermatotomy was made at the planned puncture site. Using ultrasound guidance, access into the RIGHT internal jugular vein was obtained with  visualization of needle entry into the vessel using a standard micropuncture technique. A wire was advanced into the IVC and serial fascial dilation performed. A 10 Fr sheath was placed into the internal jugular vein and advanced to the IVC. The jugular sheath was retracted into the RIGHT atrium and manometry was performed. A 5 Fr angled tip catheter was then directed into the RIGHT hepatic vein. Hepatic venogram was performed. These images demonstrated patent hepatic vein with no stenosis. The catheter was advanced to a wedge portion of the a patent vein over which the sheath was advanced into the distal RIGHT hepatic vein. Using ICE ultrasound visualization the catheter as RIGHT hepatic vein as well as the portal anatomy was defined. A planned exit site from the hepatic vein and puncture site from the portal vein was placed into a single sonographic plane. Under direct ultrasound visualization, the Argon Scorpion-X needle was advanced into the central RIGHT portal vein. Hand injection of contrast confirmed position within the portal system. A Glidewire was then advanced into the superior mesenteric vein. The tract was pre dilated with a 6 mm mustang balloon. A 5 Fr marking pigtail catheter was then advanced over the wire into the main portal vein and wire removed. Portal venogram was performed which demonstrated a patent portal vein. Portal manometry was then performed. The tract was then dilated to 8 mm with an 8 mm x 4 cm Athletis balloon. A 8-10 mm by 7 + 2 cm of GORE Viatorr TIPS stent was placed. After placement of the shunt, RIGHT atrial and portal pressures were repeated. Completion portal venogram demonstrates a patent TIPS endograft. The catheters and sheaths were removed and manual compression was applied to the RIGHT internal jugular and greater saphenous venous access sites until hemostasis was achieved. The patient was transferred to the PACU in stable condition. Pre-TIPS Mean Pressures (mmHg):  Erroneous measurements were obtained. The equipment was calibrated post TIPS placement. Post-TIPS Mean Pressures (mmHg): Right atrium:15 Portal vein: 21 Portosystemic gradient: 6 FINDINGS: *Access via the RIGHT jugular and greater saphenous veins. *ICE-guided TIPS, via the RIGHT hepatic vein to the RIGHT portal vein, with placement of a 7+2 cm GORE Viatorr stent. *Erroneous pre TIPS pressures. Post TIPS portosystemic gradient of 6 mmHg IMPRESSION: 1. Successful creation of Transjugular Intrahepatic Portosystemic Shunt (TIPS), with a post-TIPS portosystemic pressure gradient of 6 mmHg. 2. Trace ascites within the abdomen. Paracentesis was NOT performed. PLAN: The patient is enrolled in the [facility] portal hypertension follow-up clinic, and will be closely followed per the protocol. Roanna Banning, MD [facility] Radiology Electronically Signed   By: Roanna Banning [name]   On: 07/13/2023 20:58   IR US Guide Vasc Access Right Result Date: 07/13/2023 CLINICAL DATA:  TIPS due to portal hypertension Briefly, 83 year old male with a history of decompensated EtOH cirrhosis with refractory ascites. Meld 15 (06/17/2023).  Child Pugh class B, 9 points. EXAM: Procedures: 1. TRANSJUGULAR INTRAHEPATIC PORTOSYSTEMIC SHUNT (TIPS) 2. INTRACARDIAC ECHOCARDIOGRAPHY (ICE) 3. LIMITED ABDOMINAL ULTRASOUND MEDICATIONS: As antibiotic prophylaxis, Rocephin 2 gm IV was ordered pre-procedure and administered intravenously within one hour of incision. ANESTHESIA/SEDATION: Sedation by the Anesthesia Team was performed. Please see anesthesiology log for details. CONTRAST:  70 mL Omnipaque 300,  intravenous FLUOROSCOPY TIME:  Fluoroscopic dose; 267 mGy COMPLICATIONS: None immediate. PROCEDURE: Informed written consent was obtained from the patient and/or patient's representative after a thorough discussion of the procedural risks, benefits and alternatives. All questions were addressed. Maximal Sterile  Barrier Technique was utilized including caps, mask, sterile gowns, sterile gloves, sterile drape, hand hygiene and skin antiseptic. A timeout was performed prior to the initiation of the procedure. LIMITED ABDOMINAL ULTRASOUND Initial ultrasound scanning demonstrates a trace volume of ascites within the abdomen. Paracentesis was not performed. INTRACARDIAC ECHOCARDIOGRAPHY (ICE) A preliminary ultrasound of the RIGHT groin was performed and demonstrates a patent greater saphenous vein. A permanent ultrasound image was recorded. Using a combination of fluoroscopy and ultrasound, an access site was determined. A small dermatotomy was made at the planned puncture site. Using ultrasound guidance, access into the RIGHT greater saphenous vein was obtained with visualization of needle entry into the vessel using a standard micropuncture technique. A wire was advanced into the IVC insert all fascial dilation performed. An 8 Fr, 10 cm vascular sheath was placed into the external iliac vein. Through this access site, an 8 Fr AcuNav ICE catheter was advanced with ease under fluoroscopic guidance to the level of the intrahepatic inferior vena cava. TRANSJUGULAR INTRAHEPATIC PORTOSYSTEMIC SHUNT (TIPS) A preliminary ultrasound of the RIGHT neck was performed and demonstrates a patent internal jugular vein. A permanent ultrasound image was recorded. Using a combination of fluoroscopy and ultrasound, an access site was determined. A small dermatotomy was made at the planned puncture site. Using ultrasound guidance, access into the RIGHT internal jugular vein was obtained with visualization of needle entry into the vessel using a standard micropuncture technique. A wire was advanced into the IVC and serial fascial dilation performed. A 10 Fr sheath was placed into the internal jugular vein and advanced to the IVC. The jugular sheath was retracted into the RIGHT atrium and manometry was performed. A 5 Fr angled tip catheter was then  directed into the RIGHT hepatic vein. Hepatic venogram was performed. These images demonstrated patent hepatic vein with no stenosis. The catheter was advanced to a wedge portion of the a patent vein over which the sheath was advanced into the distal RIGHT hepatic vein. Using ICE ultrasound visualization the catheter as RIGHT hepatic vein as well as the portal anatomy was defined. A planned exit site from the hepatic vein and puncture site from the portal vein was placed into a single sonographic plane. Under direct ultrasound visualization, the Argon Scorpion-X needle was advanced into the central RIGHT portal vein. Hand injection of contrast confirmed position within the portal system. A Glidewire was then advanced into the superior mesenteric vein. The tract was pre dilated with a 6 mm mustang balloon. A 5 Fr marking pigtail catheter was then advanced over the wire into the main portal vein and wire removed. Portal venogram was performed which demonstrated a patent portal vein. Portal manometry was then performed. The tract was then dilated to 8 mm with an 8 mm x 4 cm Athletis balloon. A 8-10 mm by 7 + 2 cm of GORE Viatorr TIPS stent was placed. After placement of the shunt, RIGHT atrial and portal pressures were repeated. Completion portal venogram demonstrates a patent TIPS endograft. The catheters and sheaths were removed and manual compression was applied to the RIGHT internal jugular and greater saphenous venous access sites until hemostasis was achieved. The patient was transferred to the PACU in stable condition. Pre-TIPS Mean Pressures (mmHg): Erroneous measurements were obtained. The  equipment was calibrated post TIPS placement. Post-TIPS Mean Pressures (mmHg): Right atrium:15 Portal vein: 21 Portosystemic gradient: 6 FINDINGS: *Access via the RIGHT jugular and greater saphenous veins. *ICE-guided TIPS, via the RIGHT hepatic vein to the RIGHT portal vein, with placement of a 7+2 cm GORE Viatorr stent.  *Erroneous pre TIPS pressures. Post TIPS portosystemic gradient of 6 mmHg IMPRESSION: 1. Successful creation of Transjugular Intrahepatic Portosystemic Shunt (TIPS), with a post-TIPS portosystemic pressure gradient of 6 mmHg. 2. Trace ascites within the abdomen. Paracentesis was NOT performed. PLAN: The patient is enrolled in the [facility] portal hypertension follow-up clinic, and will be closely followed per the protocol. Roanna Banning, MD [facility] Radiology Electronically Signed   By: Roanna Banning [name]   On: 07/13/2023 20:58   DG Chest 1 View Result Date: 07/12/2023 CLINICAL DATA:  pleural effusion noted on recent US. reevaluation after paracentesis EXAM: CHEST  1 VIEW COMPARISON:  June 26, 2023, January 29, 2022 FINDINGS: The cardiomediastinal silhouette is unchanged in contour.Status post median sternotomy. Trace RIGHT pleural effusion. No pneumothorax. No acute pleuroparenchymal abnormality. Fluffy sclerosis within the LEFT proximal humerus, similar in comparison to more remote priors and consistent with an etiology such is a bone infarction or enchondroma. IMPRESSION: Trace RIGHT pleural effusion. No pneumothorax. Electronically Signed   By: Meda Klinefelter [name]   On: 07/12/2023 13:04   CT ABDOMEN PELVIS W WO CONTRAST Result Date: 07/04/2023 CLINICAL DATA:  62-year-old male with history of cirrhosis and refractory ascites. Preprocedure planning prior to possible transjugular intrahepatic portosystemic shunt creation. EXAM: CT ABDOMEN AND PELVIS WITHOUT AND WITH CONTRAST TECHNIQUE: Multidetector CT imaging of the abdomen and pelvis was performed using the standard protocol after bolus administration of intravenous contrast. Delayed imaging at 90 seconds was also obtained. multiplanar reconstructed images and MIPs were obtained and reviewed to evaluate the vascular anatomy. CONTRAST:  75mL ISOVUE-300 IOPAMIDOL (ISOVUE-300) INJECTION 61% COMPARISON:   12/24/2022 FINDINGS: VASCULAR IVC: Normal caliber and patent.  No variant anatomy. Renal veins: Patent bilaterally. Iliac veins: The bilateral common, external, and internal iliac veins are patent. Femoral veins: The visualized bilateral common and central aspects of the bilateral superficial and profunda veins are patent. Portal system: The hepatic veins are poorly visualized. The intra and extrahepatic portal veins are patent and normal in caliber. No evidence of gastroesophageal varices or native portosystemic shunts. Arterial system: Normal caliber abdominal aorta. Advanced atherosclerotic calcifications. Review of the MIP images confirms the above findings. NON-VASCULAR Lower chest: Trace right pleural effusion. Mild global cardiomegaly. No pericardial effusion. Hepatobiliary: Similar appearing relatively small volume liver minimal surface nodularity. No focal masses. The gallbladder is present. No intra or extrahepatic biliary ductal dilation. Pancreas: Unremarkable. No pancreatic ductal dilatation or surrounding inflammatory changes. Spleen: Normal in size without focal abnormality. Adrenals/Urinary Tract: Adrenal glands are unremarkable. Similar appearing right greater than left renal cortical thinning with scattered simple cortical cysts bilaterally, none requiring additional follow-up. No renal calculi, focal lesion, or hydronephrosis. Bladder is unremarkable. Stomach/Bowel: Stomach is within normal limits. Appendix appears normal. There are a few scattered colonic diverticula without surrounding inflammatory changes. No evidence of bowel wall thickening, distention, or inflammatory changes. Lymphatic: No abdominopelvic lymphadenopathy. Reproductive: Prostate is unremarkable. Other: Small volume ascites. Similar appearing small umbilical hernia containing ascites. Musculoskeletal: Anterior wedge compression deformity of the L1 vertebral body, new from 12/24/2022 comparison with associated a proximally 60%  height loss and no evidence of retropulsion. Scattered advanced multilevel degenerative change of the visualized thoracolumbar  spine. Similar appearing postsurgical changes after right total hip arthroplasty, partially visualized. IMPRESSION: VASCULAR 1. Patent portal system without evidence of gastroesophageal varices or portosystemic shunts. 2.  Aortic Atherosclerosis (ICD10-I70.0). NON-VASCULAR 1. Similar appearing morphologic changes of cirrhosis and portal hypertension is evidence by small volume ascites and trace right hepatic hydrothorax. 2. Age indeterminate however new from comparison L1 anterior wedge compression deformity. 3. Diverticulosis. Marliss Coots, MD [facility] Radiology Electronically Signed   By: Marliss Coots [name]   On: 07/04/2023 06:23   US Paracentesis Result Date: 07/01/2023 INDICATION: Recurrent ascites, abdominal distension, cirrhosis EXAM: ULTRASOUND GUIDED THERAPEUTIC AND DIAGNOSTIC PARACENTESIS MEDICATIONS: 1% lidocaine local COMPLICATIONS: None immediate. PROCEDURE: Informed written consent was obtained from the patient after a discussion of the risks, benefits and alternatives to treatment. A timeout was performed prior to the initiation of the procedure. Initial ultrasound scanning demonstrates a large amount of ascites within the right lower abdominal quadrant. The right lower abdomen was prepped and draped in the usual sterile fashion. 1% lidocaine was used for local anesthesia. Following this, a 6 Fr Safe-T-Centesis catheter was introduced. An ultrasound image was saved for documentation purposes. The paracentesis was performed. The catheter was removed and a dressing was applied. The patient tolerated the procedure well without immediate post procedural complication. FINDINGS: A total of approximately 1.3 L of clear peritoneal fluid was removed. Samples were sent to the laboratory as requested by the clinical team. IMPRESSION:  Successful ultrasound-guided paracentesis yielding 1.3 liters of peritoneal fluid. Electronically Signed   By: Judie Petit.  Shick [name]   On: 07/01/2023 11:20   IR Radiologist Eval & Mgmt Result Date: 06/25/2023 EXAM: ESTABLISHED PATIENT OFFICE VISIT CHIEF COMPLAINT: See below HISTORY OF PRESENT ILLNESS: See below REVIEW OF SYSTEMS: See below PHYSICAL EXAMINATION: See below ASSESSMENT AND PLAN: Please refer to completed note in the electronic medical record on [facility] Roanna Banning, MD [facility] Radiology Electronically Signed   By: Roanna Banning [name]   On: 06/25/2023 10:22   US Abdomen Limited RUQ (LIVER/GB) Result Date: 06/24/2023 CLINICAL DATA:  Cirrhosis EXAM: ULTRASOUND ABDOMEN LIMITED RIGHT UPPER QUADRANT COMPARISON:  CT abdomen pelvis 12/24/2022 FINDINGS: Gallbladder: No gallstones or wall thickening visualized. No sonographic Murphy sign noted by sonographer. Common bile duct: Diameter: 3.1 mm Liver: Coarsened echogenicity and nodular contour. No focal lesion. Portal vein is patent on color Doppler imaging with normal direction of blood flow towards the liver. Other: Moderate volume ascites.  Right pleural effusion. IMPRESSION: 1. Cirrhotic morphology of the liver. No focal lesion. 2. Moderate volume ascites. 3. Right pleural effusion. Electronically Signed   By: Annia Belt [name]   On: 06/24/2023 11:31   VAS US CAROTID Result Date: 06/18/2023 Carotid Arterial Duplex Study Patient Name:  SATORU MILICH  Date of Exam:   06/18/2023 Medical Rec #: [id number]     Accession #:    [id number] Date of Birth: 11-10-1940      Patient Gender: M Patient Age:   83 years Exam Location:  [facility] Procedure:      VAS US CAROTID Referring Phys: Ivin Booty ROBINS --------------------------------------------------------------------------------  Indications:       Carotid artery disease and left endarterectomy 02/17/2014. Risk Factors:      Hypertension,  hyperlipidemia, coronary artery disease. Other Factors:     04/19/2021 Carotid artery duplex - Right carotid: The CCA and  ICA appear occluded. Comparison Study:  No change from prior exam of 06/10/2022. Performing Technologist: Elita Quick RVT  Examination Guidelines: A complete evaluation includes B-mode imaging, spectral Doppler, color Doppler, and power Doppler as needed of all accessible portions of each vessel. Bilateral testing is considered an integral part of a complete examination. Limited examinations for reoccurring indications may be performed as noted.  Right Carotid Findings: +----------+--------+--------+--------+-----------------+----------------------+           PSV cm/sEDV cm/sStenosisPlaque           Comments                                                 Description                             +----------+--------+--------+--------+-----------------+----------------------+ CCA Prox  18      2       Occluded                                        +----------+--------+--------+--------+-----------------+----------------------+ CCA Mid                   Occluded                                        +----------+--------+--------+--------+-----------------+----------------------+ CCA Distal                Occluded                                        +----------+--------+--------+--------+-----------------+----------------------+ ICA Prox  41                                       recanalized flow       +----------+--------+--------+--------+-----------------+----------------------+ ICA Mid   24                                       Atypical waveform,                                                        recannalized           +----------+--------+--------+--------+-----------------+----------------------+ ICA Distal33                                       Atypical waveform,  recannalized.          +----------+--------+--------+--------+-----------------+----------------------+ ECA       45      1                                                       +----------+--------+--------+--------+-----------------+----------------------+ +----------+--------+-------+----------------+-------------------+           PSV cm/sEDV cmsDescribe        Arm Pressure (mmHG) +----------+--------+-------+----------------+-------------------+ [id number]     14     Multiphasic, [id number]                 +----------+--------+-------+----------------+-------------------+ +---------+--------+--+--------+--+---------+ VertebralPSV cm/s63EDV cm/s14Antegrade +---------+--------+--+--------+--+---------+  Left Carotid Findings: +----------+--------+--------+--------+------------------+--------+           PSV cm/sEDV cm/sStenosisPlaque DescriptionComments +----------+--------+--------+--------+------------------+--------+ CCA Prox  116     16                                         +----------+--------+--------+--------+------------------+--------+ CCA Mid   142     18              heterogenous               +----------+--------+--------+--------+------------------+--------+ CCA Distal152     20              heterogenous               +----------+--------+--------+--------+------------------+--------+ ICA Prox  134     20      1-39%                              +----------+--------+--------+--------+------------------+--------+ ICA Mid   108     24                                         +----------+--------+--------+--------+------------------+--------+ ICA Distal135     24                                         +----------+--------+--------+--------+------------------+--------+ ECA       145     6                                          +----------+--------+--------+--------+------------------+--------+  +----------+--------+--------+----------------+-------------------+           PSV cm/sEDV cm/sDescribe        Arm Pressure (mmHG) +----------+--------+--------+----------------+-------------------+ [id number]             Multiphasic, [id number]                 +----------+--------+--------+----------------+-------------------+ +---------+--------+--+--------+--+---------+ VertebralPSV cm/s57EDV cm/s15Antegrade +---------+--------+--+--------+--+---------+   Summary: Right Carotid: The right ICA appears to have recanalized with atypical waveform. Left Carotid: Velocities in the left ICA are consistent with a 1-39% stenosis. Vertebrals:  Bilateral vertebral arteries demonstrate antegrade flow. Subclavians: Normal flow hemodynamics were seen in bilateral subclavian  arteries. *See table(s) above for measurements and observations.  Electronically signed by Gerarda Fraction on 06/18/2023 at 5:16:30 PM.    Final     Microbiology: Results for orders placed or performed during the hospital encounter of 07/01/23  Gram stain     Status: None   Collection Time: 07/01/23 10:45 AM   Specimen: Ascitic  Result Value Ref Range Status   Specimen Description ASCITIC  Final   Special Requests ASCITIC  Final   Gram Stain   Final    ASCITIC NO ORGANISMS SEEN WBC PRESENT, PREDOMINANTLY MONONUCLEAR CYTOSPIN SMEAR Performed at [facility], [address], [place], [place] [address]    Report Status 07/01/2023 FINAL  Final  Culture, body fluid w Gram Stain-bottle     Status: None   Collection Time: 07/01/23 10:45 AM   Specimen: Ascitic  Result Value Ref Range Status   Specimen Description   Final    ASCITIC Performed at [facility], [address], [place], [place] [address]    Special Requests   Final    BOTTLES DRAWN AEROBIC AND ANAEROBIC Blood Culture adequate volume Performed at [facility], [address], [place], [place] [address]    Culture   Final    NO GROWTH 5  DAYS Performed at [facility], [address], [place], [place] [address]    Report Status 07/06/2023 FINAL  Final    Labs: CBC: Recent Labs  Lab 07/13/23 0648 07/14/23 0631  WBC 8.3 10.3  HGB 10.7* 10.1*  HCT 34.1* 31.7*  MCV 96.1 93.5  PLT 260 234   Basic Metabolic Panel: Recent Labs  Lab 07/13/23 0648 07/14/23 0631  NA 141 138  K 3.8 4.1  CL 102 102  CO2 28 26  GLUCOSE 97 122*  BUN 35* 37*  CREATININE 1.80* 1.99*  CALCIUM 8.5* 8.5*   Liver Function Tests: Recent Labs  Lab 07/13/23 0648 07/14/23 0631  AST 32 30  ALT 37 37  ALKPHOS 89 82  BILITOT 0.8 0.7  PROT 5.9* 5.7*  ALBUMIN 2.7* 2.6*   CBG: No results for input(s): "GLUCAP" in the last 168 hours.  Discharge time spent: {LESS THAN/GREATER EGBT:51761} 30 minutes.  Signed: Marolyn Haller, MD [facility] 07/16/2023

## 2023-07-17 ENCOUNTER — Telehealth (HOSPITAL_COMMUNITY): Payer: Self-pay | Admitting: Student

## 2023-07-17 NOTE — Telephone Encounter (Signed)
   [facility] TIPS post-procedure phone call follow-up   MAURION WALKOWIAK is a 83 y.o. male who underwent TIPS creation at [facility] 07/13/23 with Dr. Milford Cage    Patient is 4 days from procedure.   # of paracentesis in last month: 1 # of paracentesis in last 2 months: 2  Current diuretic regimen: Lasix 80 mg BID and Aldactone 25 mg QD Current pharmacologic encephalopathy prophylaxis/treatment: Lactulose 45 g BID   Post-TIPS Imaging:   Labs: 07/14/23 Creatinine: 1.99 Total Bilirubin: 0.7 INR: 1.1 Sodium: 138 Albumin: 2.6 Ammonia: 24  Assessment: DELRICK DEHART is an 83 y.o. male with history of DECOMPENSATED CIRRHOSIS. Patient is 4 days from TIPS creation and I spoke with the patient's wife on the phone today with Jonny Ruiz in the background intermittently chiming in. Mena had a rough day yesterday with extreme fatigue but he is doing better today. He is not having any shortness of breath but does have some abdominal distention. He is taking all of his medications as directed but he hasn't had a bowel movement in a few days. I advised Olney to increase his lactulose to TID until he begins having bowel movements. Kitai is eating/drinking ok and his wife doesn't notice anything different with his mental status. They have no needs or concerns at this time.   Recommendation:  Continue current treatment plan with next Clinic follow up scheduled for 07/24/23. The patient and his wife know I will call next week but they have the number to the IR APP office at [facility] should they need to speak with me sooner.    Electronically Signed: Mickie Kay, NP 07/17/2023, 12:16 PM

## 2023-07-20 DIAGNOSIS — R188 Other ascites: Secondary | ICD-10-CM | POA: Diagnosis not present

## 2023-07-20 DIAGNOSIS — K7460 Unspecified cirrhosis of liver: Secondary | ICD-10-CM | POA: Diagnosis not present

## 2023-07-20 DIAGNOSIS — J9621 Acute and chronic respiratory failure with hypoxia: Secondary | ICD-10-CM | POA: Diagnosis not present

## 2023-07-20 DIAGNOSIS — I4891 Unspecified atrial fibrillation: Secondary | ICD-10-CM | POA: Diagnosis not present

## 2023-07-21 ENCOUNTER — Encounter (INDEPENDENT_AMBULATORY_CARE_PROVIDER_SITE_OTHER): Payer: Self-pay | Admitting: Gastroenterology

## 2023-07-30 ENCOUNTER — Telehealth: Payer: Self-pay | Admitting: Student

## 2023-07-30 NOTE — Telephone Encounter (Signed)
 Post-TIPS follow up phone call attempted. No answer - VM left.   Alwyn Ren, AGACNP-BC 07/30/2023, 9:05 AM

## 2023-08-04 DIAGNOSIS — G4733 Obstructive sleep apnea (adult) (pediatric): Secondary | ICD-10-CM | POA: Diagnosis not present

## 2023-08-06 DIAGNOSIS — N200 Calculus of kidney: Secondary | ICD-10-CM | POA: Diagnosis not present

## 2023-08-06 DIAGNOSIS — N312 Flaccid neuropathic bladder, not elsewhere classified: Secondary | ICD-10-CM | POA: Diagnosis not present

## 2023-08-06 DIAGNOSIS — N401 Enlarged prostate with lower urinary tract symptoms: Secondary | ICD-10-CM | POA: Diagnosis not present

## 2023-08-06 DIAGNOSIS — N529 Male erectile dysfunction, unspecified: Secondary | ICD-10-CM | POA: Diagnosis not present

## 2023-08-11 ENCOUNTER — Other Ambulatory Visit: Payer: Self-pay | Admitting: Interventional Radiology

## 2023-08-11 DIAGNOSIS — K7460 Unspecified cirrhosis of liver: Secondary | ICD-10-CM

## 2023-08-12 ENCOUNTER — Encounter (HOSPITAL_COMMUNITY): Payer: Self-pay

## 2023-08-12 ENCOUNTER — Inpatient Hospital Stay (HOSPITAL_COMMUNITY)
Admission: EM | Admit: 2023-08-12 | Discharge: 2023-08-19 | DRG: 602 | Disposition: A | Discharge status: Skilled Nursing Facility | Attending: Internal Medicine | Admitting: Internal Medicine

## 2023-08-12 ENCOUNTER — Other Ambulatory Visit: Payer: Self-pay

## 2023-08-12 ENCOUNTER — Encounter (HOSPITAL_COMMUNITY): Payer: Self-pay | Admitting: Emergency Medicine

## 2023-08-12 ENCOUNTER — Inpatient Hospital Stay (HOSPITAL_COMMUNITY)
Admission: RE | Admit: 2023-08-12 | Discharge: 2023-08-12 | Disposition: A | Discharge status: Home / Self Care | Source: Ambulatory Visit | Attending: Gastroenterology | Admitting: Gastroenterology

## 2023-08-12 DIAGNOSIS — E785 Hyperlipidemia, unspecified: Secondary | ICD-10-CM | POA: Diagnosis present

## 2023-08-12 DIAGNOSIS — I10 Essential (primary) hypertension: Secondary | ICD-10-CM | POA: Diagnosis not present

## 2023-08-12 DIAGNOSIS — Z79899 Other long term (current) drug therapy: Secondary | ICD-10-CM

## 2023-08-12 DIAGNOSIS — L03316 Cellulitis of umbilicus: Principal | ICD-10-CM

## 2023-08-12 DIAGNOSIS — I5022 Chronic systolic (congestive) heart failure: Secondary | ICD-10-CM | POA: Diagnosis not present

## 2023-08-12 DIAGNOSIS — E876 Hypokalemia: Secondary | ICD-10-CM | POA: Diagnosis present

## 2023-08-12 DIAGNOSIS — Z7984 Long term (current) use of oral hypoglycemic drugs: Secondary | ICD-10-CM

## 2023-08-12 DIAGNOSIS — Z87442 Personal history of urinary calculi: Secondary | ICD-10-CM

## 2023-08-12 DIAGNOSIS — K652 Spontaneous bacterial peritonitis: Secondary | ICD-10-CM | POA: Diagnosis not present

## 2023-08-12 DIAGNOSIS — N400 Enlarged prostate without lower urinary tract symptoms: Secondary | ICD-10-CM | POA: Diagnosis present

## 2023-08-12 DIAGNOSIS — Z7901 Long term (current) use of anticoagulants: Secondary | ICD-10-CM

## 2023-08-12 DIAGNOSIS — I4891 Unspecified atrial fibrillation: Secondary | ICD-10-CM | POA: Diagnosis present

## 2023-08-12 DIAGNOSIS — R188 Other ascites: Secondary | ICD-10-CM | POA: Insufficient documentation

## 2023-08-12 DIAGNOSIS — Z96651 Presence of right artificial knee joint: Secondary | ICD-10-CM | POA: Diagnosis present

## 2023-08-12 DIAGNOSIS — G4730 Sleep apnea, unspecified: Secondary | ICD-10-CM | POA: Diagnosis present

## 2023-08-12 DIAGNOSIS — E872 Acidosis, unspecified: Secondary | ICD-10-CM | POA: Diagnosis present

## 2023-08-12 DIAGNOSIS — Z87891 Personal history of nicotine dependence: Secondary | ICD-10-CM

## 2023-08-12 DIAGNOSIS — N289 Disorder of kidney and ureter, unspecified: Secondary | ICD-10-CM | POA: Diagnosis not present

## 2023-08-12 DIAGNOSIS — K5900 Constipation, unspecified: Secondary | ICD-10-CM | POA: Diagnosis present

## 2023-08-12 DIAGNOSIS — N1832 Chronic kidney disease, stage 3b: Secondary | ICD-10-CM | POA: Diagnosis not present

## 2023-08-12 DIAGNOSIS — Z860100 Personal history of colon polyps, unspecified: Secondary | ICD-10-CM

## 2023-08-12 DIAGNOSIS — K429 Umbilical hernia without obstruction or gangrene: Secondary | ICD-10-CM

## 2023-08-12 DIAGNOSIS — K7581 Nonalcoholic steatohepatitis (NASH): Secondary | ICD-10-CM | POA: Diagnosis present

## 2023-08-12 DIAGNOSIS — K7460 Unspecified cirrhosis of liver: Secondary | ICD-10-CM | POA: Diagnosis present

## 2023-08-12 DIAGNOSIS — Z85828 Personal history of other malignant neoplasm of skin: Secondary | ICD-10-CM

## 2023-08-12 DIAGNOSIS — K219 Gastro-esophageal reflux disease without esophagitis: Secondary | ICD-10-CM | POA: Diagnosis present

## 2023-08-12 DIAGNOSIS — I130 Hypertensive heart and chronic kidney disease with heart failure and stage 1 through stage 4 chronic kidney disease, or unspecified chronic kidney disease: Secondary | ICD-10-CM | POA: Diagnosis present

## 2023-08-12 DIAGNOSIS — Z951 Presence of aortocoronary bypass graft: Secondary | ICD-10-CM

## 2023-08-12 DIAGNOSIS — I252 Old myocardial infarction: Secondary | ICD-10-CM

## 2023-08-12 DIAGNOSIS — Z8679 Personal history of other diseases of the circulatory system: Secondary | ICD-10-CM

## 2023-08-12 DIAGNOSIS — I2510 Atherosclerotic heart disease of native coronary artery without angina pectoris: Secondary | ICD-10-CM | POA: Diagnosis present

## 2023-08-12 DIAGNOSIS — I4819 Other persistent atrial fibrillation: Secondary | ICD-10-CM | POA: Diagnosis not present

## 2023-08-12 DIAGNOSIS — Z885 Allergy status to narcotic agent status: Secondary | ICD-10-CM

## 2023-08-12 DIAGNOSIS — Z8249 Family history of ischemic heart disease and other diseases of the circulatory system: Secondary | ICD-10-CM

## 2023-08-12 DIAGNOSIS — Z9981 Dependence on supplemental oxygen: Secondary | ICD-10-CM

## 2023-08-12 DIAGNOSIS — I714 Abdominal aortic aneurysm, without rupture, unspecified: Secondary | ICD-10-CM | POA: Diagnosis present

## 2023-08-12 DIAGNOSIS — N179 Acute kidney failure, unspecified: Secondary | ICD-10-CM | POA: Diagnosis present

## 2023-08-12 LAB — GRAM STAIN

## 2023-08-12 LAB — COMPREHENSIVE METABOLIC PANEL
ALT: 36 U/L (ref 0–44)
AST: 67 U/L — ABNORMAL HIGH (ref 15–41)
Albumin: 2.6 g/dL — ABNORMAL LOW (ref 3.5–5.0)
Alkaline Phosphatase: 85 U/L (ref 38–126)
Anion gap: 12 (ref 5–15)
BUN: 33 mg/dL — ABNORMAL HIGH (ref 8–23)
CO2: 27 mmol/L (ref 22–32)
Calcium: 8.5 mg/dL — ABNORMAL LOW (ref 8.9–10.3)
Chloride: 99 mmol/L (ref 98–111)
Creatinine, Ser: 2.28 mg/dL — ABNORMAL HIGH (ref 0.61–1.24)
GFR, Estimated: 28 mL/min — ABNORMAL LOW (ref >60)
Glucose, Bld: 142 mg/dL — ABNORMAL HIGH (ref 70–99)
Potassium: 2.9 mmol/L — ABNORMAL LOW (ref 3.5–5.1)
Sodium: 138 mmol/L (ref 135–145)
Total Bilirubin: 0.6 mg/dL (ref 0.0–1.2)
Total Protein: 5.9 g/dL — ABNORMAL LOW (ref 6.5–8.1)

## 2023-08-12 LAB — URINALYSIS, ROUTINE W REFLEX MICROSCOPIC
Bilirubin Urine: NEGATIVE
Glucose, UA: >=500 mg/dL — AB
Ketones, ur: NEGATIVE mg/dL
Leukocytes,Ua: NEGATIVE
Nitrite: NEGATIVE
Protein, ur: 30 mg/dL — AB
Specific Gravity, Urine: 1.011 (ref 1.005–1.030)
pH: 5 (ref 5.0–8.0)

## 2023-08-12 LAB — BODY FLUID CELL COUNT WITH DIFFERENTIAL
Eos, Fluid: 0 %
Lymphs, Fluid: 36 %
Monocyte-Macrophage-Serous Fluid: 5 % — ABNORMAL LOW (ref 50–90)
Neutrophil Count, Fluid: 59 % — ABNORMAL HIGH (ref 0–25)
Total Nucleated Cell Count, Fluid: 1794 uL — ABNORMAL HIGH (ref 0–1000)

## 2023-08-12 LAB — CBC
HCT: 31.7 % — ABNORMAL LOW (ref 39.0–52.0)
Hemoglobin: 9.9 g/dL — ABNORMAL LOW (ref 13.0–17.0)
MCH: 29.3 pg (ref 26.0–34.0)
MCHC: 31.2 g/dL (ref 30.0–36.0)
MCV: 93.8 fL (ref 80.0–100.0)
Platelets: 254 10*3/uL (ref 150–400)
RBC: 3.38 MIL/uL — ABNORMAL LOW (ref 4.22–5.81)
RDW: 15.1 % (ref 11.5–15.5)
WBC: 7 10*3/uL (ref 4.0–10.5)
nRBC: 0 % (ref 0.0–0.2)

## 2023-08-12 LAB — LACTIC ACID, PLASMA
Lactic Acid, Venous: 2.9 mmol/L (ref 0.5–1.9)
Lactic Acid, Venous: 2.1 mmol/L (ref 0.5–1.9)

## 2023-08-12 LAB — CBG MONITORING, ED: Glucose-Capillary: 106 mg/dL — ABNORMAL HIGH (ref 70–99)

## 2023-08-12 MED ORDER — VITAMIN D 25 MCG (1000 UNIT) PO TABS
2000.0000 [IU] | ORAL_TABLET | Freq: Every day | ORAL | Status: DC
  Administered 2023-08-13 – 2023-08-19 (×7): 2000 [IU] via ORAL
  Filled 2023-08-12 (×7): qty 2

## 2023-08-12 MED ORDER — LACTULOSE 10 GM/15ML PO SOLN
45.0000 g | Freq: Three times a day (TID) | ORAL | Status: DC
  Administered 2023-08-12 – 2023-08-18 (×16): 45 g via ORAL
  Filled 2023-08-12 (×17): qty 90

## 2023-08-12 MED ORDER — TIZANIDINE HCL 2 MG PO TABS: 2.0000 mg | ORAL_TABLET | Freq: Two times a day (BID) | ORAL | Status: DC | PRN

## 2023-08-12 MED ORDER — SODIUM CHLORIDE 0.9 % IV SOLN
2.0000 g | INTRAVENOUS | Status: DC
  Administered 2023-08-13 – 2023-08-15 (×3): 2 g via INTRAVENOUS
  Filled 2023-08-12 (×3): qty 20

## 2023-08-12 MED ORDER — POTASSIUM CHLORIDE CRYS ER 20 MEQ PO TBCR
40.0000 meq | EXTENDED_RELEASE_TABLET | Freq: Four times a day (QID) | ORAL | Status: AC
  Administered 2023-08-12 – 2023-08-13 (×3): 40 meq via ORAL
  Filled 2023-08-12 (×3): qty 2

## 2023-08-12 MED ORDER — ATORVASTATIN CALCIUM 40 MG PO TABS
80.0000 mg | ORAL_TABLET | Freq: Every evening | ORAL | Status: DC
  Administered 2023-08-12 – 2023-08-18 (×7): 80 mg via ORAL
  Filled 2023-08-12 (×7): qty 2

## 2023-08-12 MED ORDER — DOXAZOSIN MESYLATE 2 MG PO TABS
4.0000 mg | ORAL_TABLET | Freq: Every morning | ORAL | Status: DC
  Administered 2023-08-13 – 2023-08-19 (×7): 4 mg via ORAL
  Filled 2023-08-12: qty 1
  Filled 2023-08-12 (×7): qty 2

## 2023-08-12 MED ORDER — ALBUTEROL SULFATE (2.5 MG/3ML) 0.083% IN NEBU: 2.5000 mg | INHALATION_SOLUTION | RESPIRATORY_TRACT | Status: DC | PRN

## 2023-08-12 MED ORDER — EMPAGLIFLOZIN 10 MG PO TABS
10.0000 mg | ORAL_TABLET | Freq: Every day | ORAL | Status: DC
  Administered 2023-08-13 – 2023-08-19 (×7): 10 mg via ORAL
  Filled 2023-08-12 (×8): qty 1

## 2023-08-12 MED ORDER — AMIODARONE HCL 200 MG PO TABS
200.0000 mg | ORAL_TABLET | Freq: Every day | ORAL | Status: DC
  Administered 2023-08-13 – 2023-08-19 (×7): 200 mg via ORAL
  Filled 2023-08-12 (×7): qty 1

## 2023-08-12 MED ORDER — LIDOCAINE HCL (PF) 2 % IJ SOLN
10.0000 mL | Freq: Once | INTRAMUSCULAR | Status: AC
  Administered 2023-08-12: 10 mL

## 2023-08-12 MED ORDER — SODIUM CHLORIDE 0.9 % IV SOLN: 2.0000 g | INTRAVENOUS | Status: DC

## 2023-08-12 MED ORDER — FINASTERIDE 5 MG PO TABS
5.0000 mg | ORAL_TABLET | Freq: Every evening | ORAL | Status: DC
  Administered 2023-08-12 – 2023-08-18 (×7): 5 mg via ORAL
  Filled 2023-08-12 (×8): qty 1

## 2023-08-12 MED ORDER — SODIUM CHLORIDE 0.9 % IV SOLN: INTRAVENOUS | Status: AC

## 2023-08-12 MED ORDER — VITAMIN B-12 1000 MCG PO TABS
1000.0000 ug | ORAL_TABLET | ORAL | Status: DC
  Administered 2023-08-14 – 2023-08-18 (×2): 1000 ug via ORAL
  Filled 2023-08-12 (×3): qty 1

## 2023-08-12 MED ORDER — HYDRALAZINE HCL 20 MG/ML IJ SOLN: 5.0000 mg | INTRAMUSCULAR | Status: DC | PRN

## 2023-08-12 MED ORDER — RIVAROXABAN 15 MG PO TABS
15.0000 mg | ORAL_TABLET | Freq: Every day | ORAL | Status: DC
  Administered 2023-08-12 – 2023-08-14 (×3): 15 mg via ORAL
  Filled 2023-08-12 (×4): qty 1

## 2023-08-12 MED ORDER — SODIUM CHLORIDE 0.9 % IV SOLN: INTRAVENOUS | Status: DC

## 2023-08-12 MED ORDER — SPIRONOLACTONE 25 MG PO TABS
25.0000 mg | ORAL_TABLET | Freq: Every day | ORAL | Status: DC
  Administered 2023-08-13 – 2023-08-19 (×7): 25 mg via ORAL
  Filled 2023-08-12 (×8): qty 1

## 2023-08-12 MED ORDER — PANTOPRAZOLE SODIUM 40 MG PO TBEC
40.0000 mg | DELAYED_RELEASE_TABLET | Freq: Every day | ORAL | Status: DC
  Administered 2023-08-13 – 2023-08-19 (×7): 40 mg via ORAL
  Filled 2023-08-12 (×7): qty 1

## 2023-08-12 MED ORDER — BOOST HIGH PROTEIN PO LIQD
1.0000 | Freq: Every day | ORAL | Status: DC
  Administered 2023-08-12 – 2023-08-18 (×7): 237 mL via ORAL
  Filled 2023-08-12: qty 237

## 2023-08-12 MED ORDER — FLUTICASONE PROPIONATE 50 MCG/ACT NA SUSP: 1.0000 | Freq: Every day | NASAL | Status: DC | PRN

## 2023-08-12 MED ORDER — NITROGLYCERIN 0.4 MG SL SUBL: 0.4000 mg | SUBLINGUAL_TABLET | SUBLINGUAL | Status: DC | PRN

## 2023-08-12 MED ORDER — SODIUM CHLORIDE 0.9 % IV SOLN
2.0000 g | Freq: Once | INTRAVENOUS | Status: AC
  Administered 2023-08-12: 2 g via INTRAVENOUS
  Filled 2023-08-12: qty 20

## 2023-08-12 MED ORDER — LIDOCAINE HCL (PF) 2 % IJ SOLN
INTRAMUSCULAR | Status: AC
  Filled 2023-08-12: qty 10

## 2023-08-12 NOTE — H&P (Signed)
 [facility] H&P   Patient Demographics:    Caleb Wolf, is a 83 y.o. male  MRN: [id number]   DOB - 02/11/1941  Admit Date - 08/12/2023  Outpatient Primary MD for the patient is Carylon Perches, MD  Referring MD/NP/PA: Dr. Hyacinth Meeker  Outpatient Specialists: Gastroenterology Dr. Levon Hedger  Patient coming from: Home  Chief Complaint  Patient presents with   Weakness      HPI:    Caleb Wolf  is a 83 y.o. 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 -Patient is treated with recurrent paracentesis for reaccumulating ascites, most recently this morning, it has been drawn of the abdomen, was cloudy, and results came back with significantly elevated white blood cells, so he was called back and told to come to emergency room room for possible infection, patient reports generalized weakness, fatigue, reports he is compliant with his Lasix, Aldactone and lactulose, reports mild abdominal discomfort, but denies fever, chills. -Paracentesis performed earlier today showing 1790 white blood cells, Gram stain is negative, labs significant for low potassium at 2.9, creatinine at 2.28 up from 1.9 baseline, but cell count within normal limit, lactic acid elevated at 2.9, patient received IV Rocephin after blood cultures were sent, and [facility] hospitalist consulted to admit.     Review of systems:      A full 10 point Review of Systems was done, except as stated above, all other Review of Systems were negative.   With Past History of the following :    Past Medical History:  Diagnosis Date   AAA (abdominal aortic aneurysm) (HCC)    Arthritis    Basal cell carcinoma    BOOP (bronchiolitis obliterans with organizing pneumonia) (HCC) 2023   Carotid stenosis    Chronic kidney disease, stage 3b (HCC) 12/13/2021    Cirrhosis of liver not due to alcohol (HCC)    Coronary artery disease    Multvessel s/p CABG 2015   Enlarged prostate    Essential hypertension    GERD (gastroesophageal reflux disease)    Gout    History of colon polyps    History of hiatal hernia    History of kidney stones    Hyperlipidemia    Leukocytosis 01/21/2023   Lumbar disc disease    Myocardial infarction (HCC) 2015   Oxygen dependent    at bedtime- 2L   Persistent atrial fibrillation (HCC)    8 cardioversions and 2 ablations   Sleep apnea    CPAP   Spinal stenosis       Past Surgical History:  Procedure Laterality Date   ABLATION OF DYSRHYTHMIC FOCUS  07/28/2017   ATRIAL FIBRILLATION ABLATION N/A 07/28/2017   Procedure: ATRIAL FIBRILLATION ABLATION;  Surgeon: Hillis Range, MD;  Location: [facility] INVASIVE CV LAB;  Service: Cardiovascular;  Laterality: N/A;  ATRIAL FIBRILLATION ABLATION N/A 12/04/2020   Procedure: ATRIAL FIBRILLATION ABLATION;  Surgeon: Hillis Range, MD;  Location: [facility] INVASIVE CV LAB;  Service: Cardiovascular;  Laterality: N/A;   BACK SURGERY     CARDIAC CATHETERIZATION  05/22/2014   Procedure: IABP INSERTION;  Surgeon: Marykay Lex, MD;  Location: [facility] CATH LAB;  Service: Cardiovascular;;   CARDIOVERSION N/A 04/29/2017   Procedure: CARDIOVERSION;  Surgeon: Jonelle Sidle, MD;  Location: [facility] ENDO SUITE;  Service: Cardiovascular;  Laterality: N/A;   CARDIOVERSION N/A 08/13/2017   Procedure: CARDIOVERSION;  Surgeon: Chrystie Nose, MD;  Location: [facility] ENDOSCOPY;  Service: Cardiovascular;  Laterality: N/A;   CARDIOVERSION N/A 08/15/2019   Procedure: CARDIOVERSION;  Surgeon: Lars Masson, MD;  Location: [facility] ENDOSCOPY;  Service: Cardiovascular;  Laterality: N/A;   CARDIOVERSION N/A 03/09/2020   Procedure: CARDIOVERSION;  Surgeon: Jodelle Red, MD;  Location: [facility] ENDOSCOPY;  Service: Cardiovascular;  Laterality: N/A;   CARDIOVERSION N/A 07/06/2020   Procedure: CARDIOVERSION;  Surgeon:  Thurmon Fair, MD;  Location: [facility] ENDOSCOPY;  Service: Cardiovascular;  Laterality: N/A;   CARDIOVERSION N/A 01/18/2021   Procedure: CARDIOVERSION;  Surgeon: Chilton Si, MD;  Location: [facility] ENDOSCOPY;  Service: Cardiovascular;  Laterality: N/A;   CARDIOVERSION N/A 06/17/2021   Procedure: CARDIOVERSION;  Surgeon: Sande Rives, MD;  Location: [facility] ENDOSCOPY;  Service: Cardiovascular;  Laterality: N/A;   Cataract surgery Bilateral    COLONOSCOPY     COLONOSCOPY N/A 12/30/2017   Procedure: COLONOSCOPY;  Surgeon: Malissa Hippo, MD;  Location: [facility] ENDO SUITE;  Service: Endoscopy;  Laterality: N/A;   CORONARY ARTERY BYPASS GRAFT N/A 05/22/2014   Procedure: CORONARY ARTERY BYPASS GRAFTING (CABG) times three using left internal mammary and right saphenous vein.;  Surgeon: Loreli Slot, MD;  Location: [facility] OR;  Service: Open Heart Surgery;  Laterality: N/A;   ENDARTERECTOMY Left 02/17/2014   Procedure: ENDARTERECTOMY CAROTID WITH PATCH ANGIOPLASTY;  Surgeon: Pryor Ochoa, MD;  Location: [facility] OR;  Service: Vascular;  Laterality: Left;   ESOPHAGEAL DILATION N/A 08/22/2015   Procedure: ESOPHAGEAL DILATION;  Surgeon: Malissa Hippo, MD;  Location: [facility] ENDO [facility];  Service: Endoscopy;  Laterality: N/A;   ESOPHAGOGASTRODUODENOSCOPY N/A 08/22/2015   Procedure: ESOPHAGOGASTRODUODENOSCOPY (EGD);  Surgeon: Malissa Hippo, MD;  Location: [facility] ENDO SUITE;  Service: Endoscopy;  Laterality: N/A;  12:45 - moved to 1:55 - Ann notified pt   ESOPHAGOGASTRODUODENOSCOPY N/A 12/30/2017   Procedure: ESOPHAGOGASTRODUODENOSCOPY (EGD);  Surgeon: Malissa Hippo, MD;  Location: [facility] ENDO [facility]ITE;  Service: Endoscopy;  Laterality: N/A;  200   EYE SURGERY     cataract extraction (right) with repair macular tear , with IOL     right   FRACTURE SURGERY     bilateral wrist fractures- one ORIF   IR RADIOLOGIST EVAL & MGMT  12/01/2022   IR RADIOLOGIST EVAL & MGMT  06/25/2023   IR TIPS  07/13/2023   IR US GUIDE VASC  ACCESS RIGHT  07/13/2023   JOINT REPLACEMENT  2011   left knee   LEFT HEART CATHETERIZATION WITH CORONARY ANGIOGRAM N/A 05/22/2014   Procedure: LEFT HEART CATHETERIZATION WITH CORONARY ANGIOGRAM;  Surgeon: Marykay Lex, MD;  Location: [facility] CATH LAB;  Service: Cardiovascular;  Laterality: N/A;   LITHOTRIPSY     PARS PLANA VITRECTOMY W/ REPAIR OF MACULAR HOLE     RHINOPLASTY     TEE WITHOUT CARDIOVERSION N/A 04/29/2017   Procedure: TRANSESOPHAGEAL ECHOCARDIOGRAM (TEE) WITH PROPOFOL;  Surgeon: Jonelle Sidle, MD;  Location: [facility]  ENDO SUITE;  Service: Cardiovascular;  Laterality: N/A;   TIPS PROCEDURE N/A 07/13/2023   Procedure: TRANS-JUGULAR INTRAHEPATIC PORTAL SHUNT (TIPS);  Surgeon: Roanna Banning, MD;  Location: [facility] OR;  Service: Radiology;  Laterality: N/A;   TONSILLECTOMY     TOTAL HIP ARTHROPLASTY  12/08/2011   Procedure: TOTAL HIP ARTHROPLASTY;  Surgeon: Loanne Drilling, MD;  Location: [facility] ORS;  Service: Orthopedics;  Laterality: Right;   UPPER GI ENDOSCOPY  12/18/2015   Procedure: UPPER GI ENDOSCOPY;  Surgeon: Axel Filler, MD;  Location: [facility] ORS;  Service: General;;   WRIST SURGERY Right 31yrs ago   WRIST SURGERY Left       Social History:     Social History   Tobacco Use   Smoking status: Former    Current packs/day: 0.00    Average packs/day: 1.5 packs/day for 25.0 years (37.5 ttl pk-yrs)    Types: Cigarettes    Start date: 08/08/1960    Quit date: 06/02/1982    Years since quitting: 41.2    Passive exposure: Never   Smokeless tobacco: Never   Tobacco comments:    quit smoking 30+yrs ago  Substance Use Topics   Alcohol use: No    Alcohol/week: 0.0 standard drinks of alcohol    Comment: occasionally       Family History :     Family History  Problem Relation Age of Onset   Allergies Mother    Heart disease Mother    Hypertension Mother    Heart disease Father        MI     Home Medications:   Prior to Admission medications   Medication Sig Start Date End  Date Taking? Authorizing Provider  acetaminophen (TYLENOL) 500 MG tablet Take 1,000 mg by mouth every 6 (six) hours as needed for moderate pain.   Yes [provider]  amiodarone (PACERONE) 200 MG tablet TAKE 1 TABLET BY MOUTH EVERY DAY 04/17/23  Yes Jake Bathe, MD  atorvastatin (LIPITOR) 80 MG tablet TAKE 1 TABLET BY MOUTH EVERY DAY IN THE EVENING 04/17/23  Yes Jake Bathe, MD  Cholecalciferol (VITAMIN D) 50 MCG (2000 UT) CAPS Take 2,000 Units by mouth daily.   Yes [provider]  cyanocobalamin (VITAMIN B12) 1000 MCG tablet Take 1,000 mcg by mouth 2 (two) times a week. Tuesday and Friday   Yes [provider]  doxazosin (CARDURA) 4 MG tablet Take 4 mg by mouth in the morning. 07/06/17  Yes [provider]  empagliflozin (JARDIANCE) 10 MG TABS tablet TAKE 1 TABLET BY MOUTH DAILY BEFORE BREAKFAST. 06/15/23  Yes Jake Bathe, MD  feeding supplement (BOOST HIGH PROTEIN) LIQD Take 1 Container by mouth daily in the afternoon.   Yes [provider]  finasteride (PROSCAR) 5 MG tablet Take 5 mg by mouth every evening.    Yes [provider]  fluticasone (FLONASE) 50 MCG/ACT nasal spray Place 1 spray into both nostrils daily as needed for allergies or rhinitis.   Yes [provider]  furosemide (LASIX) 80 MG tablet TAKE 1 TABLET BY MOUTH 2 TIMES DAILY. Patient taking differently: Take 80 mg by mouth 2 (two) times daily. 80 mg in the morning and 40 mg at 2pm 06/04/23  Yes Skains, Veverly Fells, MD  lactulose (CHRONULAC) 10 GM/15ML solution Take 67.5 mLs (45 g total) by mouth 3 (three) times daily. Titrate dosage of lactulose to 2-3 soft bowels per day Patient taking differently: Take 40 g by mouth 2 (two)  times daily. 03/17/23  Yes Carlan, Chelsea L, NP  nitroGLYCERIN (NITROSTAT) 0.4 MG SL tablet Place 1 tablet (0.4 mg total) under the tongue every 5 (five) minutes x 3 doses as needed for chest pain. 06/04/20  Yes Allred, Fayrene Fearing, MD  pantoprazole  (PROTONIX) 40 MG tablet Take 1 tablet (40 mg total) by mouth daily before breakfast. 02/19/16  Yes Rehman, Joline Maxcy, MD  Rivaroxaban (XARELTO) 15 MG TABS tablet Take 1 tablet (15 mg total) by mouth daily with supper. 12/16/21  Yes Tat, Onalee Hua, MD  spironolactone (ALDACTONE) 25 MG tablet TAKE 1 TABLET (25 MG TOTAL) BY MOUTH DAILY. 05/05/23  Yes Carlan, Chelsea L, NP  tiZANidine (ZANAFLEX) 2 MG tablet Take 2 mg by mouth 2 (two) times daily as needed for muscle spasms. 07/04/23  Yes [provider]     Allergies:     Allergies  Allergen Reactions   Codeine Sulfate Nausea Only     Physical Exam:   Vitals  Blood pressure (!) 134/44, pulse (!) 54, temperature 98.2 F (36.8 C), temperature source Oral, resp. rate 16, height 5\' 9"  (1.753 m), weight 81.6 kg, SpO2 98%.   1. General Well-developed male, laying in bed, no apparent distress  2. Normal affect and insight, Not Suicidal or Homicidal, Awake Alert, Oriented X 3.  3. No F.N deficits, ALL C.Nerves Intact, Strength 5/5 all 4 extremities, Sensation intact all 4 extremities, Plantars down going.  4. Ears and Eyes appear Normal, Conjunctivae clear, PERRLA. Moist Oral Mucosa.  5. Supple Neck, No JVD, No cervical lymphadenopathy appriciated, No Carotid Bruits.  6. Symmetrical Chest wall movement, Good air movement bilaterally, CTAB.  7. RRR, No Gallops, Rubs or Murmurs, No Parasternal Heave.  8. Positive Bowel Sounds, Abdomen Soft, No tenderness, No organomegaly appriciated,No rebound -guarding or rigidity.  9.  No Cyanosis, Normal Skin Turgor, No Skin Rash or Bruise.  10. Good muscle tone,  joints appear normal , no effusions, Normal ROM.,  Trace left lower extremity edema (this is chronic per patient)    Data Review:    CBC Recent Labs  Lab 08/12/23 1455  WBC 7.0  HGB 9.9*  HCT 31.7*  PLT 254  MCV 93.8  MCH 29.3  MCHC 31.2  RDW 15.1    ------------------------------------------------------------------------------------------------------------------  Chemistries  Recent Labs  Lab 08/12/23 1455  NA 138  K 2.9*  CL 99  CO2 27  GLUCOSE 142*  BUN 33*  CREATININE 2.28*  CALCIUM 8.5*  AST 67*  ALT 36  ALKPHOS 85  BILITOT 0.6   ------------------------------------------------------------------------------------------------------------------ estimated creatinine clearance is 25.4 mL/min (A) (by C-G formula based on SCr of 2.28 mg/dL (H)). ------------------------------------------------------------------------------------------------------------------ No results for input(s): "TSH", "T4TOTAL", "T3FREE", "THYROIDAB" in the last 72 hours.  Invalid input(s): "FREET3"  Coagulation profile No results for input(s): "INR", "PROTIME" in the last 168 hours. ------------------------------------------------------------------------------------------------------------------- No results for input(s): "DDIMER" in the last 72 hours. -------------------------------------------------------------------------------------------------------------------  Cardiac Enzymes No results for input(s): "CKMB", "TROPONINI", "MYOGLOBIN" in the last 168 hours.  Invalid input(s): "CK" ------------------------------------------------------------------------------------------------------------------    Component Value Date/Time   BNP 847.0 (H) 03/21/2022 1253     ---------------------------------------------------------------------------------------------------------------  Urinalysis    Component Value Date/Time   COLORURINE YELLOW 08/12/2023 1546   APPEARANCEUR CLEAR 08/12/2023 1546   LABSPEC 1.011 08/12/2023 1546   PHURINE 5.0 08/12/2023 1546   GLUCOSEU >=500 (A) 08/12/2023 1546   HGBUR LARGE (A) 08/12/2023 1546   BILIRUBINUR NEGATIVE 08/12/2023 1546   KETONESUR NEGATIVE 08/12/2023 1546   PROTEINUR 30 (A)  08/12/2023 1546    UROBILINOGEN 0.2 02/16/2014 1333   NITRITE NEGATIVE 08/12/2023 1546   LEUKOCYTESUR NEGATIVE 08/12/2023 1546    ----------------------------------------------------------------------------------------------------------------   Imaging Results:    US Paracentesis Result Date: 08/12/2023 INDICATION: Cirrhosis, recurrent ascites, recent tips 07/13/2023 EXAM: ULTRASOUND GUIDED PARACENTESIS MEDICATIONS: 1% lidocaine local COMPLICATIONS: None immediate. PROCEDURE: Informed written consent was obtained from the patient after a discussion of the risks, benefits and alternatives to treatment. A timeout was performed prior to the initiation of the procedure. Initial ultrasound scanning demonstrates a large amount of ascites within the left lower abdominal quadrant. The right lower abdomen was prepped and draped in the usual sterile fashion. 1% lidocaine was used for local anesthesia. Following this, a 6 Fr Safe-T-Centesis catheter was introduced. An ultrasound image was saved for documentation purposes. The paracentesis was performed. The catheter was removed and a dressing was applied. The patient tolerated the procedure well without immediate post procedural complication. Patient received post-procedure intravenous albumin; see nursing notes for details. FINDINGS: A total of approximately 4 L of blood tinged exudative peritoneal fluid was removed. Samples were sent to the laboratory as requested by the clinical team. IMPRESSION: Successful ultrasound-guided paracentesis yielding 4.0 liters of peritoneal fluid. Electronically Signed   By: Judie Petit.  Shick [name]   On: 08/12/2023 11:26    Assessment & Plan:    Principal Problem:   SBP (spontaneous bacterial peritonitis) (HCC) Active Problems:   Persistent atrial fibrillation (HCC)   Atrial fibrillation with rapid ventricular response (HCC)   CAD (coronary artery disease)   Ascites  Recurrent ascites Recurrent SBP -Patient with known history of liver cirrhosis,  history of recurrent SBP in the past. -Had routine paracentesis today, he reports some abdominal tenderness, lactic acid elevated, paracentesis done today with 1794 nucleated cells. -Follow-up blood cultures, follow on final peritoneal fluid cultures, so far negative Gram stain. -Continue with IV Rocephin 2 g daily. -Patient appears to be having recurrent SBP, although his previous paracentesis with elevated nucleated cells, will consult GI to see if SBP prophylaxis Cipro and Bactrim is appropriate here  Liver cirrhosis -[place] history of liver cirrhosis in the past, recurrent ascites and hepatic encephalopathy status post TIPS. -Continue with home medications including lactulose, Aldactone, likely will resume Lasix tomorrow.  Proximal A-fib on chronic anticoagulation -Continue with amiodarone -Continue with Xarelto   Hyperlipidemia -Continue atorvastatin   BPH -Continue doxazosin and finasteride   GERD -Continue Protonix  Weakness -Will consult PT  Lactic acidosis -Hold Lasix, will give gentle hydration  Hypokalemia -replaced  AKI on CKD stage IIIb -Holding Lasix, continue with IV fluids     DVT Prophylaxis on xarleto  AM Labs Ordered, also please review Full Orders  Family Communication: Admission, patients condition and plan of care including tests being ordered have been discussed with the patient and wife at bedside who indicate understanding and agree with the plan and Code Status.  Code Status full code  Likely DC to home  Consults called: Gastroenterology requested in epic  Admission status: Observation  Time spent in minutes : 70 minutes   Huey Bienenstock M.D on 08/12/2023 at 5:20 PM   [facility] - Office  [phone]

## 2023-08-12 NOTE — Progress Notes (Signed)
 Patient tolerated left sided paracentesis procedure well today and 4 Liters of cloudy blood tinged ascites removed with labs collected and sent for processing. Patient verbalized understanding of discharge instructions and left via wheelchair with wife with no acute distress noted.

## 2023-08-12 NOTE — Progress Notes (Signed)
   08/12/23 2031  TOC Brief Assessment  Insurance and Status Reviewed  Patient has primary care physician Yes  Home environment has been reviewed Lives c/wife. Has personal care aide.  Prior level of function: Assisted.  Prior/Current Home Services Current home services (O2 ([facility]))  Readmission risk has been reviewed Yes  Transition of care needs transition of care needs identified, TOC will continue to follow   Pt plans to return home at dc and would like [facility] SN, PT, OT, if home health is needed. Currently has O2 and CPAP at home provided by [facility].

## 2023-08-12 NOTE — ED Triage Notes (Signed)
 Pt reports he had a paracentesis this morning. The dr office called him and told him to come to the ED because of a potential infection. Pt wife reports he has has increasing weakness of the last few days. Pt has a hx NASH. Pt denies N/V/D. Pt reports a slight cough, but no sob

## 2023-08-12 NOTE — Progress Notes (Signed)
   08/12/23 2006  Obs Status Letter  Medicare Obs Status Letter given? Yes

## 2023-08-12 NOTE — ED Provider Notes (Signed)
 [name] AT [facility] Provider Note   CSN: [id number] Arrival date & time: 08/12/23  1420     History  Chief Complaint  Patient presents with   Weakness    Caleb Wolf is a 83 y.o. male.   Weakness  This patient is an 83 year old male with a history of atrial fibrillation on amiodarone, he is also treated for nonalcoholic steatohepatitis and has recurrent paracentesis most recently this morning.  The fluid that was drawn off of his abdomen was cloudy and ended up having lots of white blood cells, he was called and told to come to the emergency department because of a possible infection.  He has been complaining of mild abdominal discomfort but mostly just general fatigue weakness and a slight confusion according to his significant other.  There is been no vomiting no fevers and no diarrhea outside of the ordinary with his lactulose use.  He is on Xarelto   Home Medications Prior to Admission medications   Medication Sig Start Date End Date Taking? Authorizing Provider  acetaminophen (TYLENOL) 500 MG tablet Take 1,000 mg by mouth every 6 (six) hours as needed for moderate pain.   Yes [provider]  amiodarone (PACERONE) 200 MG tablet TAKE 1 TABLET BY MOUTH EVERY DAY 04/17/23  Yes Jake Bathe, MD  atorvastatin (LIPITOR) 80 MG tablet TAKE 1 TABLET BY MOUTH EVERY DAY IN THE EVENING 04/17/23  Yes Jake Bathe, MD  Cholecalciferol (VITAMIN D) 50 MCG (2000 UT) CAPS Take 2,000 Units by mouth daily.   Yes [provider]  cyanocobalamin (VITAMIN B12) 1000 MCG tablet Take 1,000 mcg by mouth 2 (two) times a week. Tuesday and Friday   Yes [provider]  doxazosin (CARDURA) 4 MG tablet Take 4 mg by mouth in the morning. 07/06/17  Yes [provider]  empagliflozin (JARDIANCE) 10 MG TABS tablet TAKE 1 TABLET BY MOUTH DAILY BEFORE BREAKFAST. 06/15/23  Yes Jake Bathe, MD  feeding supplement (BOOST HIGH PROTEIN) LIQD  Take 1 Container by mouth daily in the afternoon.   Yes [provider]  finasteride (PROSCAR) 5 MG tablet Take 5 mg by mouth every evening.    Yes [provider]  fluticasone (FLONASE) 50 MCG/ACT nasal spray Place 1 spray into both nostrils daily as needed for allergies or rhinitis.   Yes [provider]  furosemide (LASIX) 80 MG tablet TAKE 1 TABLET BY MOUTH 2 TIMES DAILY. Patient taking differently: Take 80 mg by mouth 2 (two) times daily. 80 mg in the morning and 40 mg at 2pm 06/04/23  Yes Skains, Veverly Fells, MD  lactulose (CHRONULAC) 10 GM/15ML solution Take 67.5 mLs (45 g total) by mouth 3 (three) times daily. Titrate dosage of lactulose to 2-3 soft bowels per day Patient taking differently: Take 40 g by mouth 2 (two) times daily. 03/17/23  Yes Carlan, Chelsea L, NP  nitroGLYCERIN (NITROSTAT) 0.4 MG SL tablet Place 1 tablet (0.4 mg total) under the tongue every 5 (five) minutes x 3 doses as needed for chest pain. 06/04/20  Yes Allred, Fayrene Fearing, MD  pantoprazole (PROTONIX) 40 MG tablet Take 1 tablet (40 mg total) by mouth daily before breakfast. 02/19/16  Yes Rehman, Joline Maxcy, MD  Rivaroxaban (XARELTO) 15 MG TABS tablet Take 1 tablet (15 mg total) by mouth daily with supper. 12/16/21  Yes Tat, Onalee Hua, MD  spironolactone (ALDACTONE) 25 MG tablet TAKE 1 TABLET (25 MG TOTAL) BY MOUTH DAILY. 05/05/23  Yes Carlan, Chelsea L, NP  tiZANidine (ZANAFLEX) 2 MG tablet Take 2 mg by mouth 2 (two) times daily as needed for muscle spasms. 07/04/23  Yes [provider]      Allergies    Codeine sulfate    Review of Systems   Review of Systems  Neurological:  Positive for weakness.  All other systems reviewed and are negative.   Physical Exam Updated Vital Signs BP (!) 128/47   Pulse (!) 51   Temp 98.2 F (36.8 C) (Oral)   Resp 14   Ht 1.753 m (5\' 9" )   Wt 81.6 kg   SpO2 97%   BMI 26.58 kg/m  Physical Exam Vitals and nursing note reviewed.  Constitutional:      General:  He is not in acute distress.    Appearance: He is well-developed.  HENT:     Head: Normocephalic and atraumatic.     Mouth/Throat:     Pharynx: No oropharyngeal exudate.  Eyes:     General: No scleral icterus.       Right eye: No discharge.        Left eye: No discharge.     Conjunctiva/sclera: Conjunctivae normal.     Pupils: Pupils are equal, round, and reactive to light.  Neck:     Thyroid: No thyromegaly.     Vascular: No JVD.  Cardiovascular:     Rate and Rhythm: Normal rate and regular rhythm.     Heart sounds: Normal heart sounds. No murmur heard.    No friction rub. No gallop.  Pulmonary:     Effort: Pulmonary effort is normal. No respiratory distress.     Breath sounds: Normal breath sounds. No wheezing or rales.  Abdominal:     General: Bowel sounds are normal. There is no distension.     Palpations: Abdomen is soft. There is no mass.     Tenderness: There is abdominal tenderness.  Musculoskeletal:        General: No tenderness. Normal range of motion.     Cervical back: Normal range of motion and neck supple.     Right lower leg: Edema present.     Left lower leg: Edema present.  Lymphadenopathy:     Cervical: No cervical adenopathy.  Skin:    General: Skin is warm and dry.     Findings: No erythema or rash.  Neurological:     Mental Status: He is alert.     Coordination: Coordination normal.  Psychiatric:        Behavior: Behavior normal.     ED Results / Procedures / Treatments   Labs (all labs ordered are listed, but only abnormal results are displayed) Labs Reviewed  CBC - Abnormal; Notable for the following components:      Result Value   RBC 3.38 (*)    Hemoglobin 9.9 (*)    HCT 31.7 (*)    All other components within normal limits  COMPREHENSIVE METABOLIC PANEL - Abnormal; Notable for the following components:   Potassium 2.9 (*)    Glucose, Bld 142 (*)    BUN 33 (*)    Creatinine, Ser 2.28 (*)    Calcium 8.5 (*)    Total Protein 5.9 (*)     Albumin 2.6 (*)    AST 67 (*)    GFR, Estimated 28 (*)    All other components within normal limits  LACTIC ACID, PLASMA - Abnormal; Notable for the following components:   Lactic Acid,  Venous 2.9 (*)    All other components within normal limits  CBG MONITORING, ED - Abnormal; Notable for the following components:   Glucose-Capillary 106 (*)    All other components within normal limits  CULTURE, BLOOD (ROUTINE X 2)  CULTURE, BLOOD (ROUTINE X 2)  URINALYSIS, ROUTINE W REFLEX MICROSCOPIC  LACTIC ACID, PLASMA    EKG None  Radiology US Paracentesis Result Date: 08/12/2023 INDICATION: Cirrhosis, recurrent ascites, recent tips 07/13/2023 EXAM: ULTRASOUND GUIDED PARACENTESIS MEDICATIONS: 1% lidocaine local COMPLICATIONS: None immediate. PROCEDURE: Informed written consent was obtained from the patient after a discussion of the risks, benefits and alternatives to treatment. A timeout was performed prior to the initiation of the procedure. Initial ultrasound scanning demonstrates a large amount of ascites within the left lower abdominal quadrant. The right lower abdomen was prepped and draped in the usual sterile fashion. 1% lidocaine was used for local anesthesia. Following this, a 6 Fr Safe-T-Centesis catheter was introduced. An ultrasound image was saved for documentation purposes. The paracentesis was performed. The catheter was removed and a dressing was applied. The patient tolerated the procedure well without immediate post procedural complication. Patient received post-procedure intravenous albumin; see nursing notes for details. FINDINGS: A total of approximately 4 L of blood tinged exudative peritoneal fluid was removed. Samples were sent to the laboratory as requested by the clinical team. IMPRESSION: Successful ultrasound-guided paracentesis yielding 4.0 liters of peritoneal fluid. Electronically Signed   By: Judie Petit.  Shick [name]   On: 08/12/2023 11:26    Procedures Procedures     Medications Ordered in ED Medications  0.9 %  sodium chloride infusion ( Intravenous New Bag/Given 08/12/23 1649)  cefTRIAXone (ROCEPHIN) 2 g in sodium chloride 0.9 % 100 mL IVPB (0 g Intravenous Stopped 08/12/23 1647)    ED Course/ Medical Decision Making/ A&P                                 Medical Decision Making Amount and/or Complexity of Data Reviewed Labs: ordered.  Risk Prescription drug management. Decision regarding hospitalization.    This patient presents to the ED for concern of SBP, this involves an extensive number of treatment options, and is a complaint that carries with it a high risk of complications and morbidity.  The differential diagnosis includes infection, electrolyte abnormalities, worsening renal dysfunction, sepsis   Co morbidities that complicate the patient evaluation  Known liver failure   Additional history obtained:  Additional history obtained from medical record External records from outside source obtained and reviewed including centesis this morning Significant other reports that he had a TIPS procedure performed in February 2025   Lab Tests:  I Ordered, and personally interpreted labs.  The pertinent results include: Reviewed labs from earlier today, Gram stain showed no organisms however lots of white blood cells seen both ,polymorphonuclear and mononuclear.Culture pending, cell counts included 1794 nucleated cells with a 59% neutrophil count, with polymorphonuclears greater than 1000 the patient was told he needed to come in for evaluation No leukocytosis but renal insufficiency present    Cardiac Monitoring: / EKG:  The patient was maintained on a cardiac monitor.  I personally viewed and interpreted the cardiac monitored which showed an underlying rhythm of: Normal sinus rhythm   Consultations Obtained:  I requested consultation with the hospitalist,  and discussed lab and imaging findings as well as pertinent plan - they  recommend: Admission   Problem List / ED  Course / Critical interventions / Medication management  Rocephin 2 g I ordered medication including Rocephin for SBP Reevaluation of the patient after these medicines showed that the patient medically stable appearing but generally weak I have reviewed the patients home medicines and have made adjustments as needed   Social Determinants of Health:  Progressive liver dysfunction and failure   Test / Admission - Considered:  Will admit to the hospital         Final Clinical Impression(s) / ED Diagnoses Final diagnoses:  SBP (spontaneous bacterial peritonitis) (HCC)  Renal insufficiency    Rx / DC Orders ED Discharge Orders     None         Eber Hong, MD 08/12/23 1652

## 2023-08-12 NOTE — H&P (Incomplete)
 [facility] H&P   Patient Demographics:    Caleb Wolf, is a 83 y.o. male  MRN: [id number]   DOB - [birth year]  Admit Date - 08/12/2023  Outpatient Primary MD for the patient is Carylon Perches, MD  Referring MD/NP/PA: ***  Outpatient Specialists: ***    Patient coming from: ***  Chief Complaint  Patient presents with   Weakness      HPI:    Caleb Wolf  is a 83 y.o. male, ******    Review of systems:    In addition to the HPI above, **** No Fever-chills, No Headache, No changes with Vision or hearing, No problems swallowing food or Liquids, No Chest pain, Cough or Shortness of Breath, No Abdominal pain, No Nausea or Vommitting, Bowel movements are regular, No Blood in stool or Urine, No dysuria, No new skin rashes or bruises, No new joints pains-aches,  No new weakness, tingling, numbness in any extremity, No recent weight gain or loss, No polyuria, polydypsia or polyphagia, No significant Mental Stressors.  A full 10 point Review of Systems was done, except as stated above, all other Review of Systems were negative.   With Past History of the following :    Past Medical History:  Diagnosis Date   AAA (abdominal aortic aneurysm) (HCC)    Arthritis    Basal cell carcinoma    BOOP (bronchiolitis obliterans with organizing pneumonia) (HCC) 2023   Carotid stenosis    Chronic kidney disease, stage 3b (HCC) 12/13/2021   Cirrhosis of liver not due to alcohol (HCC)    Coronary artery disease    Multvessel s/p CABG 2015   Enlarged prostate    Essential hypertension    GERD (gastroesophageal reflux disease)    Gout    History of colon polyps    History of hiatal hernia    History of kidney stones    Hyperlipidemia    Leukocytosis 01/21/2023   Lumbar disc disease    Myocardial infarction (HCC) 2015   Oxygen dependent    at bedtime- 2L   Persistent atrial fibrillation  (HCC)    8 cardioversions and 2 ablations   Sleep apnea    CPAP   Spinal stenosis       Past Surgical History:  Procedure Laterality Date   ABLATION OF DYSRHYTHMIC FOCUS  07/28/2017   ATRIAL FIBRILLATION ABLATION N/A 07/28/2017   Procedure: ATRIAL FIBRILLATION ABLATION;  Surgeon: Hillis Range, MD;  Location: [facility];  Service: Cardiovascular;  Laterality: N/A;   ATRIAL FIBRILLATION ABLATION N/A 12/04/2020   Procedure: ATRIAL FIBRILLATION ABLATION;  Surgeon: Hillis Range, MD;  Location: [facility];  Service: Cardiovascular;  Laterality: N/A;   BACK SURGERY     CARDIAC CATHETERIZATION  05/22/2014   Procedure: IABP INSERTION;  Surgeon: Marykay Lex, MD;  Location: [facility] CATH LAB;  Service: Cardiovascular;;   CARDIOVERSION N/A 04/29/2017  Procedure: CARDIOVERSION;  Surgeon: Jonelle Sidle, MD;  Location: [facility];  Service: Cardiovascular;  Laterality: N/A;   CARDIOVERSION N/A 08/13/2017   Procedure: CARDIOVERSION;  Surgeon: Chrystie Nose, MD;  Location: [facility] ENDOSCOPY;  Service: Cardiovascular;  Laterality: N/A;   CARDIOVERSION N/A 08/15/2019   Procedure: CARDIOVERSION;  Surgeon: Lars Masson, MD;  Location: [facility] ENDOSCOPY;  Service: Cardiovascular;  Laterality: N/A;   CARDIOVERSION N/A 03/09/2020   Procedure: CARDIOVERSION;  Surgeon: Jodelle Red, MD;  Location: [facility] ENDOSCOPY;  Service: Cardiovascular;  Laterality: N/A;   CARDIOVERSION N/A 07/06/2020   Procedure: CARDIOVERSION;  Surgeon: Thurmon Fair, MD;  Location: [facility] ENDOSCOPY;  Service: Cardiovascular;  Laterality: N/A;   CARDIOVERSION N/A 01/18/2021   Procedure: CARDIOVERSION;  Surgeon: Chilton Si, MD;  Location: [facility] ENDOSCOPY;  Service: Cardiovascular;  Laterality: N/A;   CARDIOVERSION N/A 06/17/2021   Procedure: CARDIOVERSION;  Surgeon: Sande Rives, MD;  Location: [facility] ENDOSCOPY;  Service: Cardiovascular;  Laterality: N/A;   Cataract surgery Bilateral    COLONOSCOPY      COLONOSCOPY N/A 12/30/2017   Procedure: COLONOSCOPY;  Surgeon: Malissa Hippo, MD;  Location: [facility];  Service: Endoscopy;  Laterality: N/A;   CORONARY ARTERY BYPASS GRAFT N/A 05/22/2014   Procedure: CORONARY ARTERY BYPASS GRAFTING (CABG) times three using left internal mammary and right saphenous vein.;  Surgeon: Loreli Slot, MD;  Location: [facility] OR;  Service: Open Heart Surgery;  Laterality: N/A;   ENDARTERECTOMY Left 02/17/2014   Procedure: ENDARTERECTOMY CAROTID WITH PATCH ANGIOPLASTY;  Surgeon: Pryor Ochoa, MD;  Location: [facility] OR;  Service: Vascular;  Laterality: Left;   ESOPHAGEAL DILATION N/A 08/22/2015   Procedure: ESOPHAGEAL DILATION;  Surgeon: Malissa Hippo, MD;  Location: [facility];  Service: Endoscopy;  Laterality: N/A;   ESOPHAGOGASTRODUODENOSCOPY N/A 08/22/2015   Procedure: ESOPHAGOGASTRODUODENOSCOPY (EGD);  Surgeon: Malissa Hippo, MD;  Location: [facility];  Service: Endoscopy;  Laterality: N/A;  12:45 - moved to 1:55 - Ann notified pt   ESOPHAGOGASTRODUODENOSCOPY N/A 12/30/2017   Procedure: ESOPHAGOGASTRODUODENOSCOPY (EGD);  Surgeon: Malissa Hippo, MD;  Location: [facility];  Service: Endoscopy;  Laterality: N/A;  200   EYE SURGERY     cataract extraction (right) with repair macular tear , with IOL     right   FRACTURE SURGERY     bilateral wrist fractures- one ORIF   IR RADIOLOGIST EVAL & MGMT  12/01/2022   IR RADIOLOGIST EVAL & MGMT  06/25/2023   IR TIPS  07/13/2023   IR US GUIDE VASC ACCESS RIGHT  07/13/2023   JOINT REPLACEMENT  2011   left knee   LEFT HEART CATHETERIZATION WITH CORONARY ANGIOGRAM N/A 05/22/2014   Procedure: LEFT HEART CATHETERIZATION WITH CORONARY ANGIOGRAM;  Surgeon: Marykay Lex, MD;  Location: [facility] CATH LAB;  Service: Cardiovascular;  Laterality: N/A;   LITHOTRIPSY     PARS PLANA VITRECTOMY W/ REPAIR OF MACULAR HOLE     RHINOPLASTY     TEE WITHOUT CARDIOVERSION N/A 04/29/2017   Procedure: TRANSESOPHAGEAL  ECHOCARDIOGRAM (TEE) WITH PROPOFOL;  Surgeon: Jonelle Sidle, MD;  Location: [facility];  Service: Cardiovascular;  Laterality: N/A;   TIPS PROCEDURE N/A 07/13/2023   Procedure: TRANS-JUGULAR INTRAHEPATIC PORTAL SHUNT (TIPS);  Surgeon: Roanna Banning, MD;  Location: [facility] OR;  Service: Radiology;  Laterality: N/A;   TONSILLECTOMY     TOTAL HIP ARTHROPLASTY  12/08/2011   Procedure: TOTAL HIP ARTHROPLASTY;  Surgeon: Loanne Drilling, MD;  Location: [facility] ORS;  Service: Orthopedics;  Laterality: Right;   UPPER GI ENDOSCOPY  12/18/2015   Procedure: UPPER GI ENDOSCOPY;  Surgeon: Axel Filler, MD;  Location: [facility] ORS;  Service: General;;   WRIST SURGERY Right 51yrs ago   WRIST SURGERY Left       Social History:     Social History   Tobacco Use   Smoking status: Former    Current packs/day: 0.00    Average packs/day: 1.5 packs/day for 25.0 years (37.5 ttl pk-yrs)    Types: Cigarettes    Start date: 08/08/1960    Quit date: 06/02/1982    Years since quitting: 41.2    Passive exposure: Never   Smokeless tobacco: Never   Tobacco comments:    quit smoking 30+yrs ago  Substance Use Topics   Alcohol use: No    Alcohol/week: 0.0 standard drinks of alcohol    Comment: occasionally      Lives -   Mobility -   ********   Family History :     Family History  Problem Relation Age of Onset   Allergies Mother    Heart disease Mother    Hypertension Mother    Heart disease Father        MI   ********   Home Medications:   Prior to Admission medications   Medication Sig Start Date End Date Taking? Authorizing Provider  acetaminophen (TYLENOL) 500 MG tablet Take 1,000 mg by mouth every 6 (six) hours as needed for moderate pain.   Yes [provider]  amiodarone (PACERONE) 200 MG tablet TAKE 1 TABLET BY MOUTH EVERY DAY 04/17/23  Yes Jake Bathe, MD  atorvastatin (LIPITOR) 80 MG tablet TAKE 1 TABLET BY MOUTH EVERY DAY IN THE EVENING 04/17/23  Yes Jake Bathe, MD   Cholecalciferol (VITAMIN D) 50 MCG (2000 UT) CAPS Take 2,000 Units by mouth daily.   Yes [provider]  cyanocobalamin (VITAMIN B12) 1000 MCG tablet Take 1,000 mcg by mouth 2 (two) times a week. Tuesday and Friday   Yes [provider]  doxazosin (CARDURA) 4 MG tablet Take 4 mg by mouth in the morning. 07/06/17  Yes [provider]  empagliflozin (JARDIANCE) 10 MG TABS tablet TAKE 1 TABLET BY MOUTH DAILY BEFORE BREAKFAST. 06/15/23  Yes Jake Bathe, MD  feeding supplement (BOOST HIGH PROTEIN) LIQD Take 1 Container by mouth daily in the afternoon.   Yes [provider]  finasteride (PROSCAR) 5 MG tablet Take 5 mg by mouth every evening.    Yes [provider]  fluticasone (FLONASE) 50 MCG/ACT nasal spray Place 1 spray into both nostrils daily as needed for allergies or rhinitis.   Yes [provider]  furosemide (LASIX) 80 MG tablet TAKE 1 TABLET BY MOUTH 2 TIMES DAILY. Patient taking differently: Take 80 mg by mouth 2 (two) times daily. 80 mg in the morning and 40 mg at 2pm 06/04/23  Yes Skains, Veverly Fells, MD  lactulose (CHRONULAC) 10 GM/15ML solution Take 67.5 mLs (45 g total) by mouth 3 (three) times daily. Titrate dosage of lactulose to 2-3 soft bowels per day Patient taking differently: Take 40 g by mouth 2 (two) times daily. 03/17/23  Yes Carlan, Chelsea L, NP  nitroGLYCERIN (NITROSTAT) 0.4 MG SL tablet Place 1 tablet (0.4 mg total) under the tongue every 5 (five) minutes x 3 doses as needed for chest pain. 06/04/20  Yes Allred, Fayrene Fearing, MD  pantoprazole (PROTONIX) 40 MG tablet Take 1 tablet (40 mg total)  by mouth daily before breakfast. 02/19/16  Yes Rehman, Joline Maxcy, MD  Rivaroxaban (XARELTO) 15 MG TABS tablet Take 1 tablet (15 mg total) by mouth daily with supper. 12/16/21  Yes Tat, Onalee Hua, MD  spironolactone (ALDACTONE) 25 MG tablet TAKE 1 TABLET (25 MG TOTAL) BY MOUTH DAILY. 05/05/23  Yes Carlan, Chelsea L, NP  tiZANidine (ZANAFLEX) 2 MG tablet  Take 2 mg by mouth 2 (two) times daily as needed for muscle spasms. 07/04/23  Yes [provider]     Allergies:     Allergies  Allergen Reactions   Codeine Sulfate Nausea Only     Physical Exam:   Vitals  Blood pressure (!) 134/44, pulse (!) 54, temperature 98.2 F (36.8 C), temperature source Oral, resp. rate 16, height 5\' 9"  (1.753 m), weight 81.6 kg, SpO2 98%.   1. General ******* lying in bed in NAD,  ***********  2. Normal affect and insight, Not Suicidal or Homicidal, Awake Alert, Oriented X 3.  3. No F.N deficits, ALL C.Nerves Intact, Strength 5/5 all 4 extremities, Sensation intact all 4 extremities, Plantars down going.  4. Ears and Eyes appear Normal, Conjunctivae clear, PERRLA. Moist Oral Mucosa.  5. Supple Neck, No JVD, No cervical lymphadenopathy appriciated, No Carotid Bruits.  6. Symmetrical Chest wall movement, Good air movement bilaterally, CTAB.  7. RRR, No Gallops, Rubs or Murmurs, No Parasternal Heave.  8. Positive Bowel Sounds, Abdomen Soft, No tenderness, No organomegaly appriciated,No rebound -guarding or rigidity.  9.  No Cyanosis, Normal Skin Turgor, No Skin Rash or Bruise.  10. Good muscle tone,  joints appear normal , no effusions, Normal ROM.  11. No Palpable Lymph Nodes in Neck or Axillae  **************   Data Review:    CBC Recent Labs  Lab 08/12/23 1455  WBC 7.0  HGB 9.9*  HCT 31.7*  PLT 254  MCV 93.8  MCH 29.3  MCHC 31.2  RDW 15.1   ------------------------------------------------------------------------------------------------------------------  Chemistries  Recent Labs  Lab 08/12/23 1455  NA 138  K 2.9*  CL 99  CO2 27  GLUCOSE 142*  BUN 33*  CREATININE 2.28*  CALCIUM 8.5*  AST 67*  ALT 36  ALKPHOS 85  BILITOT 0.6   ------------------------------------------------------------------------------------------------------------------ estimated creatinine clearance is 25.4 mL/min (A) (by C-G formula  based on SCr of 2.28 mg/dL (H)). ------------------------------------------------------------------------------------------------------------------ No results for input(s): "TSH", "T4TOTAL", "T3FREE", "THYROIDAB" in the last 72 hours.  Invalid input(s): "FREET3"  Coagulation profile No results for input(s): "INR", "PROTIME" in the last 168 hours. ------------------------------------------------------------------------------------------------------------------- No results for input(s): "DDIMER" in the last 72 hours. -------------------------------------------------------------------------------------------------------------------  Cardiac Enzymes No results for input(s): "CKMB", "TROPONINI", "MYOGLOBIN" in the last 168 hours.  Invalid input(s): "CK" ------------------------------------------------------------------------------------------------------------------    Component Value Date/Time   BNP 847.0 (H) 03/21/2022 1253     ---------------------------------------------------------------------------------------------------------------  Urinalysis    Component Value Date/Time   COLORURINE YELLOW 08/12/2023 1546   APPEARANCEUR CLEAR 08/12/2023 1546   LABSPEC 1.011 08/12/2023 1546   PHURINE 5.0 08/12/2023 1546   GLUCOSEU >=500 (A) 08/12/2023 1546   HGBUR LARGE (A) 08/12/2023 1546   BILIRUBINUR NEGATIVE 08/12/2023 1546   KETONESUR NEGATIVE 08/12/2023 1546   PROTEINUR 30 (A) 08/12/2023 1546   UROBILINOGEN 0.2 02/16/2014 1333   NITRITE NEGATIVE 08/12/2023 1546   LEUKOCYTESUR NEGATIVE 08/12/2023 1546    ----------------------------------------------------------------------------------------------------------------   Imaging Results:    US Paracentesis Result Date: 08/12/2023 INDICATION: Cirrhosis, recurrent ascites, recent tips 07/13/2023 EXAM: ULTRASOUND GUIDED PARACENTESIS MEDICATIONS: 1% lidocaine local COMPLICATIONS: None immediate. PROCEDURE:  Informed written consent was  obtained from the patient after a discussion of the risks, benefits and alternatives to treatment. A timeout was performed prior to the initiation of the procedure. Initial ultrasound scanning demonstrates a large amount of ascites within the left lower abdominal quadrant. The right lower abdomen was prepped and draped in the usual sterile fashion. 1% lidocaine was used for local anesthesia. Following this, a 6 Fr Safe-T-Centesis catheter was introduced. An ultrasound image was saved for documentation purposes. The paracentesis was performed. The catheter was removed and a dressing was applied. The patient tolerated the procedure well without immediate post procedural complication. Patient received post-procedure intravenous albumin; see nursing notes for details. FINDINGS: A total of approximately 4 L of blood tinged exudative peritoneal fluid was removed. Samples were sent to the laboratory as requested by the clinical team. IMPRESSION: Successful ultrasound-guided paracentesis yielding 4.0 liters of peritoneal fluid. Electronically Signed   By: Judie Petit.  Shick [name]   On: 08/12/2023 11:26    My personal review of EKG: Rhythm NSR, Rate  ** /min, QTc *** , no Acute ST changes   Assessment & Plan:    Principal Problem:   SBP (spontaneous bacterial peritonitis) (HCC) Active Problems:   Persistent atrial fibrillation (HCC)   Atrial fibrillation with rapid ventricular response (HCC)   CAD (coronary artery disease)   Ascites  Recurrent ascites Recurrent SBP -Patient with known history of liver cirrhosis, history of recurrent SBP in the past. -Had routine paracentesis today, he reports some abdominal tenderness, lactic acid elevated, paracentesis done today with 1794 nucleated cells. -Follow-up blood cultures, follow on final peritoneal fluid cultures, so far negative Gram stain. -Continue with IV Rocephin 2 g daily. -Patient appears to be having recurrent SBP, although his previous paracentesis with  elevated nucleated cells, will consult GI to see if SBP prophylaxis Cipro and Bactrim is appropriate here  Liver cirrhosis -[place] history of liver cirrhosis in the past, recurrent ascites and hepatic encephalopathy status post TIPS. -Continue with home medications including lactulose, Aldactone, likely will resume Lasix tomorrow.  Proximal A-fib on chronic anticoagulation -Continue with amiodarone -Continue with Xarelto   Hyperlipidemia -Continue atorvastatin   BPH -Continue doxazosin and finasteride   GERD -Continue Protonix  Weakness -Will consult PT  Lactic acidosis -Hold Lasix, will give gentle hydration  Hypokalemia -replaced  AKI on CKD stage IIIb -Holding Lasix, continue with IV fluids     DVT Prophylaxis on xarleto  AM Labs Ordered, also please review Full Orders  Family Communication: Admission, patients condition and plan of care including tests being ordered have been discussed with the patient and wife at bedside who indicate understanding and agree with the plan and Code Status.  Code Status full code  Likely DC to home  Consults called: Gastroenterology requested in epic  Admission status: Observation  Time spent in minutes : 70 minutes   Huey Bienenstock M.D on 08/12/2023 at 5:12 PM   [facility] - Office  [phone]

## 2023-08-13 ENCOUNTER — Inpatient Hospital Stay (HOSPITAL_COMMUNITY)

## 2023-08-13 DIAGNOSIS — L03316 Cellulitis of umbilicus: Secondary | ICD-10-CM | POA: Diagnosis not present

## 2023-08-13 DIAGNOSIS — K828 Other specified diseases of gallbladder: Secondary | ICD-10-CM | POA: Diagnosis not present

## 2023-08-13 DIAGNOSIS — J9611 Chronic respiratory failure with hypoxia: Secondary | ICD-10-CM | POA: Diagnosis not present

## 2023-08-13 DIAGNOSIS — Z7901 Long term (current) use of anticoagulants: Secondary | ICD-10-CM | POA: Diagnosis not present

## 2023-08-13 DIAGNOSIS — K5900 Constipation, unspecified: Secondary | ICD-10-CM | POA: Diagnosis not present

## 2023-08-13 DIAGNOSIS — I5022 Chronic systolic (congestive) heart failure: Secondary | ICD-10-CM | POA: Diagnosis not present

## 2023-08-13 DIAGNOSIS — K219 Gastro-esophageal reflux disease without esophagitis: Secondary | ICD-10-CM | POA: Diagnosis not present

## 2023-08-13 DIAGNOSIS — I4891 Unspecified atrial fibrillation: Secondary | ICD-10-CM | POA: Diagnosis not present

## 2023-08-13 DIAGNOSIS — I2510 Atherosclerotic heart disease of native coronary artery without angina pectoris: Secondary | ICD-10-CM | POA: Diagnosis not present

## 2023-08-13 DIAGNOSIS — G4730 Sleep apnea, unspecified: Secondary | ICD-10-CM | POA: Diagnosis not present

## 2023-08-13 DIAGNOSIS — E785 Hyperlipidemia, unspecified: Secondary | ICD-10-CM | POA: Diagnosis not present

## 2023-08-13 DIAGNOSIS — R262 Difficulty in walking, not elsewhere classified: Secondary | ICD-10-CM | POA: Diagnosis not present

## 2023-08-13 DIAGNOSIS — E872 Acidosis, unspecified: Secondary | ICD-10-CM | POA: Diagnosis not present

## 2023-08-13 DIAGNOSIS — I5032 Chronic diastolic (congestive) heart failure: Secondary | ICD-10-CM | POA: Diagnosis not present

## 2023-08-13 DIAGNOSIS — I4819 Other persistent atrial fibrillation: Secondary | ICD-10-CM | POA: Diagnosis not present

## 2023-08-13 DIAGNOSIS — E876 Hypokalemia: Secondary | ICD-10-CM | POA: Diagnosis not present

## 2023-08-13 DIAGNOSIS — R188 Other ascites: Secondary | ICD-10-CM | POA: Diagnosis not present

## 2023-08-13 DIAGNOSIS — N1832 Chronic kidney disease, stage 3b: Secondary | ICD-10-CM | POA: Diagnosis not present

## 2023-08-13 DIAGNOSIS — I252 Old myocardial infarction: Secondary | ICD-10-CM | POA: Diagnosis not present

## 2023-08-13 DIAGNOSIS — I10 Essential (primary) hypertension: Secondary | ICD-10-CM | POA: Diagnosis not present

## 2023-08-13 DIAGNOSIS — N289 Disorder of kidney and ureter, unspecified: Secondary | ICD-10-CM | POA: Diagnosis not present

## 2023-08-13 DIAGNOSIS — M48061 Spinal stenosis, lumbar region without neurogenic claudication: Secondary | ICD-10-CM | POA: Diagnosis not present

## 2023-08-13 DIAGNOSIS — K652 Spontaneous bacterial peritonitis: Secondary | ICD-10-CM | POA: Diagnosis not present

## 2023-08-13 DIAGNOSIS — R488 Other symbolic dysfunctions: Secondary | ICD-10-CM | POA: Diagnosis not present

## 2023-08-13 DIAGNOSIS — I480 Paroxysmal atrial fibrillation: Secondary | ICD-10-CM | POA: Diagnosis not present

## 2023-08-13 DIAGNOSIS — K7460 Unspecified cirrhosis of liver: Secondary | ICD-10-CM

## 2023-08-13 DIAGNOSIS — K429 Umbilical hernia without obstruction or gangrene: Secondary | ICD-10-CM | POA: Diagnosis not present

## 2023-08-13 DIAGNOSIS — Z96651 Presence of right artificial knee joint: Secondary | ICD-10-CM | POA: Diagnosis not present

## 2023-08-13 DIAGNOSIS — I714 Abdominal aortic aneurysm, without rupture, unspecified: Secondary | ICD-10-CM | POA: Diagnosis not present

## 2023-08-13 DIAGNOSIS — N179 Acute kidney failure, unspecified: Secondary | ICD-10-CM | POA: Diagnosis not present

## 2023-08-13 DIAGNOSIS — N400 Enlarged prostate without lower urinary tract symptoms: Secondary | ICD-10-CM | POA: Diagnosis not present

## 2023-08-13 DIAGNOSIS — Z951 Presence of aortocoronary bypass graft: Secondary | ICD-10-CM | POA: Diagnosis not present

## 2023-08-13 DIAGNOSIS — K7581 Nonalcoholic steatohepatitis (NASH): Secondary | ICD-10-CM | POA: Diagnosis not present

## 2023-08-13 DIAGNOSIS — Z7984 Long term (current) use of oral hypoglycemic drugs: Secondary | ICD-10-CM | POA: Diagnosis not present

## 2023-08-13 DIAGNOSIS — I130 Hypertensive heart and chronic kidney disease with heart failure and stage 1 through stage 4 chronic kidney disease, or unspecified chronic kidney disease: Secondary | ICD-10-CM | POA: Diagnosis not present

## 2023-08-13 DIAGNOSIS — M6281 Muscle weakness (generalized): Secondary | ICD-10-CM | POA: Diagnosis not present

## 2023-08-13 LAB — CBC
HCT: 27.8 % — ABNORMAL LOW (ref 39.0–52.0)
Hemoglobin: 8.7 g/dL — ABNORMAL LOW (ref 13.0–17.0)
MCH: 28.9 pg (ref 26.0–34.0)
MCHC: 31.3 g/dL (ref 30.0–36.0)
MCV: 92.4 fL (ref 80.0–100.0)
Platelets: 210 10*3/uL (ref 150–400)
RBC: 3.01 MIL/uL — ABNORMAL LOW (ref 4.22–5.81)
RDW: 14.7 % (ref 11.5–15.5)
WBC: 6.4 10*3/uL (ref 4.0–10.5)
nRBC: 0 % (ref 0.0–0.2)

## 2023-08-13 LAB — COMPREHENSIVE METABOLIC PANEL
ALT: 36 U/L (ref 0–44)
AST: 66 U/L — ABNORMAL HIGH (ref 15–41)
Albumin: 2.2 g/dL — ABNORMAL LOW (ref 3.5–5.0)
Alkaline Phosphatase: 68 U/L (ref 38–126)
Anion gap: 9 (ref 5–15)
BUN: 32 mg/dL — ABNORMAL HIGH (ref 8–23)
CO2: 25 mmol/L (ref 22–32)
Calcium: 7.8 mg/dL — ABNORMAL LOW (ref 8.9–10.3)
Chloride: 105 mmol/L (ref 98–111)
Creatinine, Ser: 2.06 mg/dL — ABNORMAL HIGH (ref 0.61–1.24)
GFR, Estimated: 32 mL/min — ABNORMAL LOW (ref >60)
Glucose, Bld: 100 mg/dL — ABNORMAL HIGH (ref 70–99)
Potassium: 2.9 mmol/L — ABNORMAL LOW (ref 3.5–5.1)
Sodium: 139 mmol/L (ref 135–145)
Total Bilirubin: 0.5 mg/dL (ref 0.0–1.2)
Total Protein: 5 g/dL — ABNORMAL LOW (ref 6.5–8.1)

## 2023-08-13 MED ORDER — POTASSIUM CHLORIDE CRYS ER 20 MEQ PO TBCR
40.0000 meq | EXTENDED_RELEASE_TABLET | Freq: Once | ORAL | Status: AC
  Administered 2023-08-13: 40 meq via ORAL
  Filled 2023-08-13: qty 2

## 2023-08-13 MED ORDER — MORPHINE SULFATE (PF) 2 MG/ML IV SOLN
2.0000 mg | INTRAVENOUS | Status: DC | PRN
  Administered 2023-08-13: 2 mg via INTRAVENOUS
  Filled 2023-08-13: qty 1

## 2023-08-13 MED ORDER — FUROSEMIDE 40 MG PO TABS
80.0000 mg | ORAL_TABLET | Freq: Two times a day (BID) | ORAL | Status: DC
  Administered 2023-08-14 – 2023-08-17 (×7): 80 mg via ORAL
  Filled 2023-08-13 (×7): qty 2

## 2023-08-13 MED ORDER — IOHEXOL 300 MG/ML  SOLN
80.0000 mL | Freq: Once | INTRAMUSCULAR | Status: AC | PRN
  Administered 2023-08-13: 80 mL via INTRAVENOUS

## 2023-08-13 MED ORDER — ALBUMIN HUMAN 25 % IV SOLN
50.0000 g | Freq: Three times a day (TID) | INTRAVENOUS | Status: AC
Start: 2023-08-13 — End: 2023-08-14
  Administered 2023-08-13 – 2023-08-14 (×2): 50 g via INTRAVENOUS
  Filled 2023-08-13 (×2): qty 200

## 2023-08-13 MED ORDER — SERTRALINE HCL 50 MG PO TABS
25.0000 mg | ORAL_TABLET | Freq: Every day | ORAL | Status: DC
  Administered 2023-08-13 – 2023-08-18 (×6): 25 mg via ORAL
  Filled 2023-08-13 (×7): qty 1

## 2023-08-13 MED ORDER — ALBUMIN HUMAN 25 % IV SOLN: 100.0000 g | Freq: Once | INTRAVENOUS | Status: DC

## 2023-08-13 NOTE — Evaluation (Signed)
 Physical Therapy Evaluation Patient Details Name: Caleb Wolf MRN: [id number] DOB: [birth year] Today's Date: 08/13/2023  History of Present Illness  Caleb Wolf  is a 83 y.o. 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  -Patient is treated with recurrent paracentesis for reaccumulating ascites, most recently this morning, it has been drawn of the abdomen, was cloudy, and results came back with significantly elevated white blood cells, so he was called back and told to come to emergency room room for possible infection, patient reports generalized weakness, fatigue, reports he is compliant with his Lasix, Aldactone and lactulose, reports mild abdominal discomfort, but denies fever, chills.  -Paracentesis performed earlier today showing 1790 white blood cells, Gram stain is negative, labs significant for low potassium at 2.9, creatinine at 2.28 up from 1.9 baseline, but cell count within normal limit, lactic acid elevated at 2.9, patient received IV Rocephin after blood cultures were sent, and [facility] hospitalist consulted to admit.   Clinical Impression  Patient demonstrates good return for sitting up at bedside with [facility] partially raised, no loss of balance during ambulation in room/hallway, had mild bilateral foot slap, but able to actively complete full bilateral ankle dorsiflexion against gravity, good return for transferring to/from commode in bathroom and tolerated sitting up in chair after therapy with spouse present. Patient will benefit from continued skilled physical therapy in hospital and recommended venue below to increase strength, balance, endurance for safe ADLs and gait.       If plan is discharge home, recommend the following: A little help with walking and/or transfers;A little help with bathing/dressing/bathroom;Help with stairs or ramp for entrance;Assistance with cooking/housework    Can travel by private vehicle        Equipment Recommendations None recommended by PT  Recommendations for Other Services       Functional Status Assessment Patient has had a recent decline in their functional status and demonstrates the ability to make significant improvements in function in a reasonable and predictable amount of time.     Precautions / Restrictions Precautions Precautions: Fall Restrictions Weight Bearing Restrictions Per Provider Order: No      Mobility  Bed Mobility Overal bed mobility: Needs Assistance Bed Mobility: Supine to Sit     Supine to sit: Supervision, HOB elevated     General bed mobility comments: labored movement with HOB partially raised    Transfers Overall transfer level: Needs assistance Equipment used: Rolling walker (2 wheels) Transfers: Sit to/from Stand, Bed to chair/wheelchair/BSC Sit to Stand: Supervision, Contact guard assist   Step pivot transfers: Supervision, Contact guard assist       General transfer comment: slightly labored movement with fair/good return for transferring to from commode in bathroom    Ambulation/Gait Ambulation/Gait assistance: Contact guard assist Gait Distance (Feet): 65 Feet Assistive device: Rolling walker (2 wheels) Gait Pattern/deviations: Decreased step length - right, Decreased step length - left, Decreased stride length, Steppage, Decreased dorsiflexion - right, Decreased dorsiflexion - left Gait velocity: decreased     General Gait Details: slightly labored movement with poor carryover for heel to toe stepping due to limited bilateral active dorsiflexion resulting in mild foot slap, no loss of balance, limited mostly due to fatigue  Stairs            Wheelchair Mobility     Tilt Bed    Modified Rankin (Stroke Patients Only)  Balance Overall balance assessment: Needs assistance Sitting-balance support: Feet supported, No upper extremity supported Sitting  balance-Leahy Scale: Good Sitting balance - Comments: seated at EOB   Standing balance support: Reliant on assistive device for balance, During functional activity, Bilateral upper extremity supported Standing balance-Leahy Scale: Fair Standing balance comment: fair/good using RW                             Pertinent Vitals/Pain Pain Assessment Pain Assessment: Faces Faces Pain Scale: Hurts little more Pain Location: stomach Pain Descriptors / Indicators: Sore, Discomfort, Grimacing Pain Intervention(s): Limited activity within patient's tolerance, Monitored during session, Repositioned    Home Living Family/patient expects to be discharged to:: Private residence Living Arrangements: Spouse/significant other Available Help at Discharge: Family Type of Home: House Home Access: Level entry       Home Layout: One level Home Equipment: Agricultural consultant (2 wheels);Cane - single point;Hand held shower head      Prior Function Prior Level of Function : Needs assist       Physical Assist : Mobility (physical);ADLs (physical) Mobility (physical): Bed mobility;Transfers;Gait;Stairs   Mobility Comments: Household ambulation using Rollator ADLs Comments: Assisted by family     Extremity/Trunk Assessment   Upper Extremity Assessment Upper Extremity Assessment: Overall WFL for tasks assessed    Lower Extremity Assessment Lower Extremity Assessment: Generalized weakness    Cervical / Trunk Assessment Cervical / Trunk Assessment: Normal  Communication   Communication Communication: No apparent difficulties    Cognition Arousal: Alert Behavior During Therapy: WFL for tasks assessed/performed   PT - Cognitive impairments: No apparent impairments                         Following commands: Intact       Cueing Cueing Techniques: Verbal cues     General Comments      Exercises     Assessment/Plan    PT Assessment Patient needs continued PT  services  PT Problem List Decreased strength;Decreased activity tolerance;Decreased balance;Decreased mobility       PT Treatment Interventions DME instruction;Gait training;Stair training;Therapeutic activities;Functional mobility training;Therapeutic exercise;Balance training;Patient/family education    PT Goals (Current goals can be found in the Care Plan section)  Acute Rehab PT Goals Patient Stated Goal: return home with family to assist PT Goal Formulation: With patient/family Time For Goal Achievement: 08/20/23 Potential to Achieve Goals: Good    Frequency Min 3X/week     Co-evaluation               AM-PAC PT "6 Clicks" Mobility  Outcome Measure Help needed turning from your back to your side while in a flat bed without using bedrails?: None Help needed moving from lying on your back to sitting on the side of a flat bed without using bedrails?: A Little Help needed moving to and from a bed to a chair (including a wheelchair)?: A Little Help needed standing up from a chair using your arms (e.g., wheelchair or bedside chair)?: A Little Help needed to walk in hospital room?: A Little Help needed climbing 3-5 steps with a railing? : A Lot 6 Click Score: 18    End of Session   Activity Tolerance: Patient tolerated treatment well;Patient limited by fatigue Patient left: in chair;with call bell/phone within reach;with family/visitor present Nurse Communication: Mobility status PT Visit Diagnosis: Unsteadiness on feet (R26.81);Other abnormalities of gait and mobility (R26.89);Muscle weakness (generalized) (M62.81)  Time: 1610-9604 PT Time Calculation (min) (ACUTE ONLY): 21 min   Charges:   PT Evaluation $PT Eval Moderate Complexity: 1 Mod PT Treatments $Therapeutic Activity: 8-22 mins PT General Charges $$ ACUTE PT VISIT: 1 Visit         1:51 PM, 08/13/23 Ocie Bob, MPT Physical Therapist with [facility] 336 [phone] office [phone]  mobile phone

## 2023-08-13 NOTE — Hospital Course (Addendum)
 83 y.o. 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 -Patient is treated with recurrent paracentesis for reaccumulating ascites, most recently this morning, it has been drawn of the abdomen, was cloudy, and results came back with significantly elevated white blood cells, so he was called back and told to come to emergency room room for possible infection, patient reports generalized weakness, fatigue, reports he is compliant with his Lasix, Aldactone and lactulose, reports mild abdominal discomfort, but denies fever, chills. -Paracentesis performed earlier today showing 1790 white blood cells, Gram stain is negative, labs significant for low potassium at 2.9, creatinine at 2.28 up from 1.9 baseline, but cell count within normal limit, lactic acid elevated at 2.9, patient received IV Rocephin after blood cultures were sent, and [facility] hospitalist consulted to admit.

## 2023-08-13 NOTE — Consult Note (Signed)
 Gastroenterology Consult   Referring Provider: No ref. provider found Primary Care Physician:  Carylon Perches, MD Primary Gastroenterologist:  Dolores Frame, MD  Patient ID: Caleb Wolf; [id number]; June 28, 1940   Admit date: 08/12/2023  LOS: 0 days   Date of Consultation: 08/13/2023  Reason for Consultation:  Concern for SBP  History of Present Illness   Caleb Wolf is a 83 y.o. 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  Has follow-up with infectious disease outpatient regarding elevated white count in the setting of ascites, was taken off SBP prophylaxis by ID.  Underwent paracentesis yesterday 3/12.  Cell count with total nucleated cells of 1794, 59% neutrophils, 5% monocytes/macrophage.  This meets SBP criteria.  WBC present, both PMN and mononuclear but no organisms seen on Gram stain.  Culture pending, no growth at 24 hours.  Given elevated PMNs, he was recommended to proceed to the ED given provider discussed with his wife who reported that he had been feeling weak over the last few days to 1 week and having more confusion but denied any abdominal pain and had not had any fevers, nausea, or vomiting.  She reached out to his outpatient infectious disease provider.  Bactrim was stopped by ID in the past.   Consult/Today:  Feeling okay today. Does admit to some abdominal pain/tenderness in the lower abdomen near his naval. Wife and patient report it has been reddened today, did not notice it yesterday. Denies N/V, dysphagia, chest pain, shortness of breath. Wife reports that ID did tell them to stop the bactrim. No changes to diuretic regimen since his last OV with [facility] in  January.   Cirrhosis history: Hematemesis/coffee ground emesis: No History of variceal bleeding: No Abdominal pain: yes, tender today.  Abdominal distention/worsening ascites: Yes, para about every 3 weeks.  Fever/chills: none Episodes of confusion/disorientation: Per wife there has been some mild intermittent confusion at home.  Number of daily bowel movements: sometimes 1 per day. Tacking lactulose 60ml BID and sometimes TID when no BM occurs.  Taking diuretics?: Yes Lasix managed by cards (120-160 mg alternating every other day), and Aldactone 25 mg daily Date of last EGD: July 2019 Prior history of banding?:  No Prior episodes of SBP: No identified organisms, has had recurrent concerns for elevated PMNs Last time liver imaging was performed: CT A/P 06/26/2023 (minimal surface nodularity, small liver, no focal mass, no biliary dilation Last AFP: 3.1, December 2024  MELD score: 15 as of labs from 2/11   EGD July 2019: -Midesophagus dilated -Partial fundal wrap -Presence of lipoma second portion of duodenum  Colonoscopy July 2019: -Small polyps in the cecum (tubular adenoma) -Diverticulosis -External hemorrhoids -Repeat colonoscopy in 7 years  Past Medical History:  Diagnosis Date   AAA (abdominal aortic aneurysm) (HCC)    Arthritis    Basal cell carcinoma    BOOP (bronchiolitis obliterans with organizing pneumonia) (HCC) 2023   Carotid stenosis    Chronic kidney disease, stage 3b (HCC) 12/13/2021   Cirrhosis of liver not due to alcohol (HCC)    Coronary artery disease    Multvessel s/p CABG 2015   Enlarged prostate    Essential hypertension    GERD (gastroesophageal reflux  disease)    Gout    History of colon polyps    History of hiatal hernia    History of kidney stones    Hyperlipidemia    Leukocytosis 01/21/2023   Lumbar disc disease    Myocardial infarction (HCC) 2015   Oxygen dependent    at bedtime- 2L   Persistent atrial fibrillation (HCC)    8  cardioversions and 2 ablations   Sleep apnea    CPAP   Spinal stenosis     Past Surgical History:  Procedure Laterality Date   ABLATION OF DYSRHYTHMIC FOCUS  07/28/2017   ATRIAL FIBRILLATION ABLATION N/A 07/28/2017   Procedure: ATRIAL FIBRILLATION ABLATION;  Surgeon: Hillis Range, MD;  Location: [facility] INVASIVE CV LAB;  Service: Cardiovascular;  Laterality: N/A;   ATRIAL FIBRILLATION ABLATION N/A 12/04/2020   Procedure: ATRIAL FIBRILLATION ABLATION;  Surgeon: Hillis Range, MD;  Location: [facility] INVASIVE CV LAB;  Service: Cardiovascular;  Laterality: N/A;   BACK SURGERY     CARDIAC CATHETERIZATION  05/22/2014   Procedure: IABP INSERTION;  Surgeon: Marykay Lex, MD;  Location: [facility] CATH LAB;  Service: Cardiovascular;;   CARDIOVERSION N/A 04/29/2017   Procedure: CARDIOVERSION;  Surgeon: Jonelle Sidle, MD;  Location: [facility] ENDO SUITE;  Service: Cardiovascular;  Laterality: N/A;   CARDIOVERSION N/A 08/13/2017   Procedure: CARDIOVERSION;  Surgeon: Chrystie Nose, MD;  Location: [facility] ENDOSCOPY;  Service: Cardiovascular;  Laterality: N/A;   CARDIOVERSION N/A 08/15/2019   Procedure: CARDIOVERSION;  Surgeon: Lars Masson, MD;  Location: [facility] ENDOSCOPY;  Service: Cardiovascular;  Laterality: N/A;   CARDIOVERSION N/A 03/09/2020   Procedure: CARDIOVERSION;  Surgeon: Jodelle Red, MD;  Location: [facility] ENDOSCOPY;  Service: Cardiovascular;  Laterality: N/A;   CARDIOVERSION N/A 07/06/2020   Procedure: CARDIOVERSION;  Surgeon: Thurmon Fair, MD;  Location: [facility] ENDOSCOPY;  Service: Cardiovascular;  Laterality: N/A;   CARDIOVERSION N/A 01/18/2021   Procedure: CARDIOVERSION;  Surgeon: Chilton Si, MD;  Location: [facility] ENDOSCOPY;  Service: Cardiovascular;  Laterality: N/A;   CARDIOVERSION N/A 06/17/2021   Procedure: CARDIOVERSION;  Surgeon: Sande Rives, MD;  Location: [facility] ENDOSCOPY;  Service: Cardiovascular;  Laterality: N/A;   Cataract surgery Bilateral    COLONOSCOPY     COLONOSCOPY  N/A 12/30/2017   Procedure: COLONOSCOPY;  Surgeon: Malissa Hippo, MD;  Location: [facility] ENDO SU[facility];  Service: Endoscopy;  Laterality: N/A;   CORONARY ARTERY BYPASS GRAFT N/A 05/22/2014   Procedure: CORONARY ARTERY BYPASS GRAFTING (CABG) times three using left internal mammary and right saphenous vein.;  Surgeon: Loreli Slot, MD;  Location: [facility] OR;  Service: Open Heart Surgery;  Laterality: N/A;   ENDARTERECTOMY Left 02/17/2014   Procedure: ENDARTERECTOMY CAROTID WITH PATCH ANGIOPLASTY;  Surgeon: Pryor Ochoa, MD;  Location[facility] OR;  Service: Vascular;  Laterality: Left;   ESOPHAGEAL DILATION N/A 08/22/2015   Procedure: ESOPHAGEAL DILATION;  Surgeon: Malissa Hippo, MD;  Location: [facility] ENDO [facility]ITE;  Service: Endoscopy;  Laterality: N/A;   ESOPHAGOGASTRODUODENOSCOPY N/A 08/22/2015   Procedure: ESOPHAGOGASTRODUODENOSCOPY (EGD);  Surgeon: Malissa Hippo, MD;  Location: [facility] ENDO SUITE;  Service: Endoscopy;  Laterality: N/A;  12:45 - moved to 1:55 - Ann notified pt   ESOPHAGOGASTRODUODENOSCOPY N/A 12/30/2017   Procedure: ESOPHAGOGASTRODUODENOSCOPY (EGD);  Surgeon: Malissa Hippo, MD;  Location: [facility] ENDO SUITE;  Service: Endoscopy;  Laterality: N/A;  200   EYE SURGERY     cataract extraction (right) with repair macular tear , with IOL     right   FRACTURE SURGERY  bilateral wrist fractures- one ORIF   IR RADIOLOGIST EVAL & MGMT  12/01/2022   IR RADIOLOGIST EVAL & MGMT  06/25/2023   IR TIPS  07/13/2023   IR US GUIDE VASC ACCESS RIGHT  07/13/2023   JOINT REPLACEMENT  2011   left knee   LEFT HEART CATHETERIZATION WITH CORONARY ANGIOGRAM N/A 05/22/2014   Procedure: LEFT HEART CATHETERIZATION WITH CORONARY ANGIOGRAM;  Surgeon: Marykay Lex, MD;  Location: [facility] CATH LAB;  Service: Cardiovascular;  Laterality: N/A;   LITHOTRIPSY     PARS PLANA VITRECTOMY W/ REPAIR OF MACULAR HOLE     RHINOPLASTY     TEE WITHOUT CARDIOVERSION N/A 04/29/2017   Procedure: TRANSESOPHAGEAL ECHOCARDIOGRAM (TEE) WITH  PROPOFOL;  Surgeon: Jonelle Sidle, MD;  Location: [facility] ENDO SUITE;  Service: Cardiovascular;  Laterality: N/A;   TIPS PROCEDURE N/A 07/13/2023   Procedure: TRANS-JUGULAR INTRAHEPATIC PORTAL SHUNT (TIPS);  Surgeon: Roanna Banning, MD;  Location: [facility] OR;  Service: Radiology;  Laterality: N/A;   TONSILLECTOMY     TOTAL HIP ARTHROPLASTY  12/08/2011   Procedure: TOTAL HIP ARTHROPLASTY;  Surgeon: Loanne Drilling, MD;  Location: [facility] ORS;  Service: Orthopedics;  Laterality: Right;   UPPER GI ENDOSCOPY  12/18/2015   Procedure: UPPER GI ENDOSCOPY;  Surgeon: Axel Filler, MD;  Location: [facility] ORS;  Service: General;;   WRIST SURGERY Right 71yrs ago   WRIST SURGERY Left     Prior to Admission medications   Medication Sig Start Date End Date Taking? Authorizing Provider  acetaminophen (TYLENOL) 500 MG tablet Take 1,000 mg by mouth every 6 (six) hours as needed for moderate pain.   Yes [provider]  amiodarone (PACERONE) 200 MG tablet TAKE 1 TABLET BY MOUTH EVERY DAY 04/17/23  Yes Jake Bathe, MD  atorvastatin (LIPITOR) 80 MG tablet TAKE 1 TABLET BY MOUTH EVERY DAY IN THE EVENING 04/17/23  Yes Jake Bathe, MD  Cholecalciferol (VITAMIN D) 50 MCG (2000 UT) CAPS Take 2,000 Units by mouth daily.   Yes [provider]  cyanocobalamin (VITAMIN B12) 1000 MCG tablet Take 1,000 mcg by mouth 2 (two) times a week. Tuesday and Friday   Yes [provider]  doxazosin (CARDURA) 4 MG tablet Take 4 mg by mouth in the morning. 07/06/17  Yes [provider]  empagliflozin (JARDIANCE) 10 MG TABS tablet TAKE 1 TABLET BY MOUTH DAILY BEFORE BREAKFAST. 06/15/23  Yes Jake Bathe, MD  feeding supplement (BOOST HIGH PROTEIN) LIQD Take 1 Container by mouth daily in the afternoon.   Yes [provider]  finasteride (PROSCAR) 5 MG tablet Take 5 mg by mouth every evening.    Yes [provider]  fluticasone (FLONASE) 50 MCG/ACT nasal spray Place 1 spray into both nostrils  daily as needed for allergies or rhinitis.   Yes [provider]  furosemide (LASIX) 80 MG tablet TAKE 1 TABLET BY MOUTH 2 TIMES DAILY. Patient taking differently: Take 80 mg by mouth 2 (two) times daily. 80 mg in the morning and 40 mg at 2pm 06/04/23  Yes Skains, Veverly Fells, MD  lactulose (CHRONULAC) 10 GM/15ML solution Take 67.5 mLs (45 g total) by mouth 3 (three) times daily. Titrate dosage of lactulose to 2-3 soft bowels per day Patient taking differently: Take 40 g by mouth 2 (two) times daily. 03/17/23  Yes Carlan, Chelsea L, NP  nitroGLYCERIN (NITROSTAT) 0.4 MG SL tablet Place 1 tablet (0.4 mg total) under the tongue every 5 (five) minutes x 3 doses as  needed for chest pain. 06/04/20  Yes Allred, Fayrene Fearing, MD  pantoprazole (PROTONIX) 40 MG tablet Take 1 tablet (40 mg total) by mouth daily before breakfast. 02/19/16  Yes Rehman, Joline Maxcy, MD  Rivaroxaban (XARELTO) 15 MG TABS tablet Take 1 tablet (15 mg total) by mouth daily with supper. 12/16/21  Yes Tat, Onalee Hua, MD  spironolactone (ALDACTONE) 25 MG tablet TAKE 1 TABLET (25 MG TOTAL) BY MOUTH DAILY. 05/05/23  Yes Carlan, Chelsea L, NP  tiZANidine (ZANAFLEX) 2 MG tablet Take 2 mg by mouth 2 (two) times daily as needed for muscle spasms. 07/04/23  Yes [provider]    Current Facility-Administered Medications  Medication Dose Route Frequency Provider Last Rate Last Admin   albuterol (PROVENTIL) (2.5 MG/3ML) 0.083% nebulizer solution 2.5 mg  2.5 mg Nebulization Q2H PRN Elgergawy, Leana Roe, MD       amiodarone (PACERONE) tablet 200 mg  200 mg Oral Daily Elgergawy, Leana Roe, MD   200 mg at 08/13/23 0926   atorvastatin (LIPITOR) tablet 80 mg  80 mg Oral QPM Elgergawy, Leana Roe, MD   80 mg at 08/12/23 1815   cefTRIAXone (ROCEPHIN) 2 g in sodium chloride 0.9 % 100 mL IVPB  2 g Intravenous Q24H Elgergawy, Leana Roe, MD 200 mL/hr at 08/13/23 0941 2 g at 08/13/23 0941   cholecalciferol (VITAMIN D3) 25 MCG (1000 UNIT) tablet 2,000 Units  2,000 Units  Oral Daily Elgergawy, Leana Roe, MD   2,000 Units at 08/13/23 0926   [START ON 08/14/2023] cyanocobalamin (VITAMIN B12) tablet 1,000 mcg  1,000 mcg Oral Once per day on Tuesday Friday Elgergawy, Leana Roe, MD       doxazosin (CARDURA) tablet 4 mg  4 mg Oral q AM Elgergawy, Leana Roe, MD   4 mg at 08/13/23 0602   empagliflozin (JARDIANCE) tablet 10 mg  10 mg Oral QAC breakfast Elgergawy, Leana Roe, MD   10 mg at 08/13/23 7829   feeding supplement (BOOST HIGH PROTEIN) liquid 237 mL  1 Container Oral Q1500 Elgergawy, Leana Roe, MD   237 mL at 08/12/23 1852   finasteride (PROSCAR) tablet 5 mg  5 mg Oral QPM Elgergawy, Leana Roe, MD   5 mg at 08/12/23 1815   fluticasone (FLONASE) 50 MCG/ACT nasal spray 1 spray  1 spray Each Nare Daily PRN Elgergawy, Leana Roe, MD       hydrALAZINE (APRESOLINE) injection 5 mg  5 mg Intravenous Q4H PRN Elgergawy, Leana Roe, MD       lactulose (CHRONULAC) 10 GM/15ML solution 45 g  45 g Oral TID Elgergawy, Leana Roe, MD   45 g at 08/13/23 0925   nitroGLYCERIN (NITROSTAT) SL tablet 0.4 mg  0.4 mg Sublingual Q5 Min x 3 PRN Elgergawy, Leana Roe, MD       pantoprazole (PROTONIX) EC tablet 40 mg  40 mg Oral QAC breakfast Elgergawy, Leana Roe, MD   40 mg at 08/13/23 5621   Rivaroxaban (XARELTO) tablet 15 mg  15 mg Oral Q supper Elgergawy, Leana Roe, MD   15 mg at 08/12/23 1817   spironolactone (ALDACTONE) tablet 25 mg  25 mg Oral Daily Elgergawy, Leana Roe, MD   25 mg at 08/13/23 0929   tiZANidine (ZANAFLEX) tablet 2 mg  2 mg Oral BID PRN Elgergawy, Leana Roe, MD        Allergies as of 08/12/2023 - Review Complete 08/12/2023  Allergen Reaction Noted   Codeine sulfate Nausea Only 12/01/2011    Family History  Problem Relation  Age of Onset   Allergies Mother    Heart disease Mother    Hypertension Mother    Heart disease Father        MI    Social History   Socioeconomic History   Marital status: Married    Spouse name: Not on file   Number of children: Not on file   Years of  education: Not on file   Highest education level: Not on file  Occupational History   Occupation: retired    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  Tobacco Use   Smoking status: Former    Current packs/day: 0.00    Average packs/day: 1.5 packs/day for 25.0 years (37.5 ttl pk-yrs)    Types: Cigarettes    Start date: 08/08/1960    Quit date: 06/02/1982    Years since quitting: 41.2    Passive exposure: Never   Smokeless tobacco: Never   Tobacco comments:    quit smoking 30+yrs ago  Vaping Use   Vaping status: Never Used  Substance and Sexual Activity   Alcohol use: No    Alcohol/week: 0.0 standard drinks of alcohol    Comment: occasionally    Drug use: No   Sexual activity: Yes  Other Topics Concern   Not on file  Social History Narrative   Not on file   Social Drivers of Health   Financial Resource Strain: Not on file  Food Insecurity: No Food Insecurity (08/12/2023)   Hunger Vital Sign    Worried About Running Out of Food in the Last Year: Never true    Ran Out of Food in the Last Year: Never true  Transportation Needs: No Transportation Needs (08/12/2023)   PRAPARE - Administrator, Civil Service (Medical): No    Lack of Transportation (Non-Medical): No  Physical Activity: Not on file  Stress: Not on file  Social Connections: Socially Integrated (08/12/2023)   Social Connection and Isolation Panel [NHANES]    Frequency of Communication with Friends and Family: More than three times a week    Frequency of Social Gatherings with Friends and Family: More than three times a week    Attends Religious Services: More than 4 times per year    Active Member of [employer] or Organizations: Yes    Attends Banker Meetings: Never    Marital Status: Married  Catering manager Violence: Not At Risk (08/12/2023)   Humiliation, Afraid, Rape, and Kick questionnaire    Fear of Current or Ex-Partner: No    Emotionally Abused: No    Physically Abused: No    Sexually Abused:  No     Review of Systems   Gen: Denies any fever, chills, loss of appetite, change in weight or weight loss CV: Denies chest pain, heart palpitations, syncope, edema  Resp: Denies shortness of breath with rest, cough, wheezing, coughing up blood, and pleurisy. GI: Denies vomiting blood, jaundice, and fecal incontinence.   Denies dysphagia or odynophagia. GU : Denies urinary burning, blood in urine, urinary frequency, and urinary incontinence. MS: Denies joint pain, limitation of movement, swelling, cramps, and atrophy.  Derm: Denies rash, itching, dry skin, hives. Psych: Denies depression, anxiety, memory loss, hallucinations, and confusion. Heme: Denies bruising or bleeding Neuro:  Denies any headaches, dizziness, paresthesias, shaking  Physical Exam   Vital Signs in last 24 hours: Temp:  [97.9 F (36.6 C)-98.4 F (36.9 C)] 98 F (36.7 C) (03/13 0455) Pulse Rate:  [47-57] 49 (03/13 0455) Resp:  [13-21] 18 (  03/13 0455) BP: (113-151)/(42-97) 113/48 (03/13 0455) SpO2:  [96 %-100 %] 97 % (03/13 0455) Weight:  [81.6 kg] 81.6 kg (03/12 1438) Last BM Date : 08/11/23  General:   Alert, pleasant and cooperative in NAD Head:  Normocephalic and atraumatic. Eyes:  Sclera clear, no icterus.   Conjunctiva pink. Ears:  Normal auditory acuity. Mouth:  No deformity or lesions, dentition normal. Neck:  Supple; no masses Abdomen:  Soft, nontender and nondistended. No masses, hepatosplenomegaly or hernias noted. Normal bowel sounds, without guarding, and without rebound.   Rectal: deferred   Msk:  Symmetrical without gross deformities. Normal posture. Extremities:  Without clubbing or edema. Neurologic:  Alert and oriented x4. Intermittent incongruent statements. No asterixis.  Skin:  Intact without significant lesions or rashes. Psych:  Alert and cooperative. Normal mood and affect.  Intake/Output from previous day: 03/12 0701 - 03/13 0700 In: 975.8 [P.O.:580; I.V.:295.8; IV  Piggyback:100] Out: 1000 [Urine:1000] Intake/Output this shift: No intake/output data recorded.   Labs/Studies   Recent Labs Recent Labs    08/12/23 1455 08/13/23 0434  WBC 7.0 6.4  HGB 9.9* 8.7*  HCT 31.7* 27.8*  PLT 254 210   BMET Recent Labs    08/12/23 1455 08/13/23 0434  NA 138 139  K 2.9* 2.9*  CL 99 105  CO2 27 25  GLUCOSE 142* 100*  BUN 33* 32*  CREATININE 2.28* 2.06*  CALCIUM 8.5* 7.8*   LFT Recent Labs    08/12/23 1455 08/13/23 0434  PROT 5.9* 5.0*  ALBUMIN 2.6* 2.2*  AST 67* 66*  ALT 36 36  ALKPHOS 85 68  BILITOT 0.6 0.5   PT/INR No results for input(s): "LABPROT", "INR" in the last 72 hours. Hepatitis Panel No results for input(s): "HEPBSAG", "HCVAB", "HEPAIGM", "HEPBIGM" in the last 72 hours. C-Diff No results for input(s): "CDIFFTOX" in the last 72 hours.  Radiology/Studies US Paracentesis Result Date: 08/12/2023 INDICATION: Cirrhosis, recurrent ascites, recent tips 07/13/2023 EXAM: ULTRASOUND GUIDED PARACENTESIS MEDICATIONS: 1% lidocaine local COMPLICATIONS: None immediate. PROCEDURE: Informed written consent was obtained from the patient after a discussion of the risks, benefits and alternatives to treatment. A timeout was performed prior to the initiation of the procedure. Initial ultrasound scanning demonstrates a large amount of ascites within the left lower abdominal quadrant. The right lower abdomen was prepped and draped in the usual sterile fashion. 1% lidocaine was used for local anesthesia. Following this, a 6 Fr Safe-T-Centesis catheter was introduced. An ultrasound image was saved for documentation purposes. The paracentesis was performed. The catheter was removed and a dressing was applied. The patient tolerated the procedure well without immediate post procedural complication. Patient received post-procedure intravenous albumin; see nursing notes for details. FINDINGS: A total of approximately 4 L of blood tinged exudative peritoneal  fluid was removed. Samples were sent to the laboratory as requested by the clinical team. IMPRESSION: Successful ultrasound-guided paracentesis yielding 4.0 liters of peritoneal fluid. Electronically Signed   By: Judie Petit.  Shick [name]   On: 08/12/2023 11:26   Assessment   OBRYAN RADU is a 83 y.o. 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   SBP: -Multiple hospitalizations for concern for SBP and was asymptomatic. -Multiple prior cultures with no  growth, identifiable organism. -Current culture with NGTD -Initially in the past was on Vantin after receiving IV Cipro and then transitioned to Cipro for SBP prophylaxis however given concern for QTc prolongation, he was switched to Bactrim DS -Following with ID given ongoing elevated WBC counts despite no organism being grown with ascitic cultures.  Patient and wife reported that ID advised him to discontinue his prophylactic Bactrim. -Prior AFB and fungal cultures were negative. - Recommend indefinite prophylaxis of SBP going forward however will await recommendations from ID as well.  -albumin per protocol (Max 100g day one, then 1g/kg day 3)  Decompensated cirrhosis: -MELD 15 -Course complicated by refractory ascites and recurrent suspected SBP -Underwent TIPS procedure February 2025 -Ongoing mild recurring ascites -Maintained on home diuretics -Taking 40 g of lactulose 2-3 times daily at home -No prior documented HE (on lactulose for constipation) -No overt HE, if develops then will need to start xifaxin. (No asterixis today)  Umbilical hernia: -erythematous -tender on exam -intermittent worsening constipation -previously only containing  ascites, slightly firm on palpation -performing CT A/P for further evaluation.  May need additional bowel regimen given only 1 BM daily on average with lactulose.   Plan / Recommendations   Rocephin 2 g IV daily for 5 days.  Albumin 25% 1.5 g/kg today will given 50g every 8 hours to not fluid overload patient given his HF history, will need additional albumin on day 3 Continue to follow peritoneal cultures Continue lactulose 40g (60ml) TID while inpatient.  2 g sodium diet Monitor for HE, low threshold for starting Xifaxin Infectious disease inpatient consult placed to assist with adequate inpatient coverage as well as help with determining need for outpatient prophylaxis.  CT A/P w/ IV contrast to evaluate firmness to umbilical region and abdominal tenderness. May need additional bowel regimen for constipation.       08/13/2023, 10:32 AM  Brooke Bonito, MSN, FNP-BC, AGACNP-BC [facility]

## 2023-08-13 NOTE — TOC Initial Note (Signed)
 Transition of Care [facility]) - Initial/Assessment Note    Patient Details  Name: Caleb Wolf MRN: [id number] Date of Birth: [birth year]  Transition of Care [facility]) CM/SW Contact:    Karn Cassis, LCSW Phone Number: 08/13/2023, 11:17 AM  Clinical Narrative: PT recommending home health. Pt's wife reports he recently was discharged from [facility]. They would like HHPT again with [facility]. Clydie Braun with [facility] can accept. Will need HHPT order. TOC will follow.                   Expected Discharge Plan: Home w Home Health Services Barriers to Discharge: Continued Medical Work up   Patient Goals and CMS Choice Patient states their goals for this hospitalization and ongoing recovery are:: return home   Choice offered to / list presented to : Spouse [place] ownership interest in [facility].provided to::  (n/a)    Expected Discharge Plan and Services In-house Referral: Clinical Social Work   Post Acute Care Choice: Home Health Living arrangements for the past 2 months: Single Family Home                           HH Arranged: PT HH Agency: [facility] Date HH Agency Contacted: 08/13/23 Time HH Agency Contacted: [id number] Representative spoke with at [facility] Agency: Clydie Braun  Prior Living Arrangements/Services Living arrangements for the past 2 months: Single Family Home Lives with:: Spouse Patient language and need for interpreter reviewed:: Yes Do you feel safe going back to the place where you live?: Yes          Current home services: DME (O2, rollator, BSC, cpap) Criminal Activity/Legal Involvement Pertinent to Current Situation/Hospitalization: No - Comment as needed  Activities of Daily Living   ADL Screening (condition at time of admission) Independently performs ADLs?: No Does the patient have a NEW difficulty with bathing/dressing/toileting/self-feeding that is expected to last >3 days?: Yes (Initiates electronic notice to provider for  possible OT consult) Does the patient have a NEW difficulty with getting in/out of bed, walking, or climbing stairs that is expected to last >3 days?: Yes (Initiates electronic notice to provider for possible PT consult) Does the patient have a NEW difficulty with communication that is expected to last >3 days?: Yes (Initiates electronic notice to provider for possible SLP consult) Is the patient deaf or have difficulty hearing?: Yes Does the patient have difficulty seeing, even when wearing glasses/contacts?: Yes Does the patient have difficulty concentrating, remembering, or making decisions?: Yes  Permission Sought/Granted                  Emotional Assessment         Alcohol / Substance Use: Not Applicable Psych Involvement: No (comment)  Admission diagnosis:  Renal insufficiency [N28.9] SBP (spontaneous bacterial peritonitis) (HCC) [K65.2] Patient Active Problem List   Diagnosis Date Noted   Decompensated cirrhosis (HCC) 07/13/2023   Paroxysmal atrial fibrillation (HCC) 07/13/2023   Leukocytosis 01/21/2023   Spontaneous bacterial peritonitis (HCC) 01/14/2023   SBP (spontaneous bacterial peritonitis) (HCC) 12/26/2022   Preop respiratory exam 12/17/2022   Hepatic cirrhosis (HCC) 11/10/2022   Ascites 11/10/2022   Constipation 11/10/2022   Pleural effusion 09/22/2022   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   Ground glass opacity present on imaging of lung 12/31/2021   Hemoptysis 12/13/2021   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) 12/13/2021   CAD (coronary artery disease) 12/10/2021   Chronic respiratory failure  with hypoxia (HCC) 12/10/2021   Posterior vitreous detachment of left eye 02/13/2020   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   Vitreous floaters, left 02/13/2020   History of vitrectomy 02/13/2020   Left epiretinal membrane 02/13/2020   Hyperlipidemia 12/07/2019   Low back pain 11/15/2019   Lumbar post-laminectomy syndrome 11/15/2019   Spinal  stenosis of lumbar region 11/15/2019   Pain in joint of right hip 06/09/2019   Trigger finger 04/08/2019   Special screening for malignant neoplasms, colon 11/25/2017   Absolute anemia 11/25/2017   Persistent atrial fibrillation (HCC) 07/28/2017   GERD (gastroesophageal reflux disease) 04/28/2017   Chronic kidney disease, stage 3b (HCC) 04/28/2017   Achalasia 12/18/2015   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   Encounter for therapeutic drug monitoring 05/31/2014   Atrial fibrillation with rapid ventricular response (HCC) 05/22/2014   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   Abnormal EKG    NSTEMI (non-ST elevated myocardial infarction) (HCC)    Carotid artery stenosis without cerebral infarction    BPH (benign prostatic hyperplasia) 03/27/2014   Chest pain 02/09/2014   HTN (hypertension) 02/09/2014   Carotid stenosis 08/02/2013   Peripheral edema 08/02/2013   Urge incontinence of urine 10/04/2012   OSA (obstructive sleep apnea) 09/21/2012   Recurrent nephrolithiasis 04/05/2012   Difficulty walking 01/29/2012   Balance disorder 01/29/2012   OA (osteoarthritis) of hip 12/08/2011   History of revision of total replacement of right hip joint 12/08/2011   ED (erectile dysfunction) of organic origin 04/07/2011   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   History of total knee replacement, left 09/10/2009   PCP:  Carylon Perches, MD Pharmacy:   [id number] - [place], [place] - [address] AT [facility] [address] [place] [address] Phone: [phone] Fax: [phone]     Social Drivers of Health (SDOH) Social History: SDOH Screenings   Food Insecurity: No Food Insecurity (08/12/2023)  Housing: Low Risk  (08/12/2023)  Transportation Needs: No Transportation Needs (08/12/2023)  Utilities: Not At Risk (08/12/2023)  Depression (PHQ2-9): Low Risk  (01/21/2023)  Social Connections: Socially Integrated (08/12/2023)  Tobacco Use: Medium Risk (08/12/2023)   SDOH Interventions:     Readmission Risk  Interventions    01/16/2023   11:30 AM 12/16/2021   11:51 AM  Readmission Risk Prevention Plan  Transportation Screening Complete Complete  PCP or Specialist Appt within 5-7 Days  Complete  Home Care Screening  Complete  Medication Review (RN CM)  Complete  HRI or Home Care Consult Complete   Social Work Consult for Recovery Care Planning/Counseling Complete   Palliative Care Screening Not Applicable   Medication Review Oceanographer) Complete

## 2023-08-13 NOTE — Progress Notes (Signed)
 PROGRESS NOTE  Caleb Wolf  FIE:[id number] DOB: August 05, 1940 DOA: 08/12/2023 PCP: Carylon Perches, MD   Chief Complaint  Patient presents with   Weakness   Level of care: Med-Surg  Brief Admission History:   83 y.o. 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 -Patient is treated with recurrent paracentesis for reaccumulating ascites, most recently this morning, it has been drawn of the abdomen, was cloudy, and results came back with significantly elevated white blood cells, so he was called back and told to come to emergency room room for possible infection, patient reports generalized weakness, fatigue, reports he is compliant with his Lasix, Aldactone and lactulose, reports mild abdominal discomfort, but denies fever, chills. -Paracentesis performed earlier today showing 1790 white blood cells, Gram stain is negative, labs significant for low potassium at 2.9, creatinine at 2.28 up from 1.9 baseline, but cell count within normal limit, lactic acid elevated at 2.9, patient received IV Rocephin after blood cultures were sent, and [facility] hospitalist consulted to admit.   Assessment and Plan:  Recurrent ascites Recurrent SBP -Patient with known history of liver cirrhosis, history of recurrent SBP in the past. 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 -paracentesis 3/12 with 1794 nucleated cells. -Follow-up blood cultures, follow on final peritoneal fluid cultures, so far negative Gram stain. -Continue with IV Rocephin 2 g daily x 5 days -Patient appears to be having recurrent SBP, although his previous paracentesis with elevated nucleated cells, will consult GI to see if SBP prophylaxis Cipro and Bactrim is appropriate here   Liver cirrhosis -[place] history of liver cirrhosis in the past, recurrent ascites and hepatic encephalopathy status post TIPS. -Continue with home medications including lactulose,  Aldactone, furosemide.   Proximal A-fib on chronic anticoagulation -Continue with amiodarone -Continue with Xarelto   Hyperlipidemia -Continue atorvastatin   BPH -Continue doxazosin and finasteride   GERD -Continue Protonix   Weakness -PT eval requested    Lactic acidosis -Hold Lasix, for brief holiday, resume 3/14 home dosing   Hypokalemia -replaced   AKI on CKD stage IIIb -Holding Lasix for brief  holiday, resume 3/14  home dosing    DVT prophylaxis: rivaroxaban Code Status: Full  Family Communication: wife at bedside  Disposition: anticipate home   Consultants:  GI  Procedures:  Paracentesis 3/12 Antimicrobials:  Ceftriaxone 3/12>>   Subjective: Pt reports that he is less nauseated and no abdominal pain and no fever at this time.   Objective: Vitals:   08/12/23 1733 08/12/23 2107 08/13/23 0455 08/13/23 1303  BP: (!) 144/66 (!) 124/53 (!) 113/48 (!) 108/44  Pulse: (!) 54 (!) 47 (!) 49 (!) 49  Resp: 13 16 18 18   Temp: 98.4 F (36.9 C) 97.9 F (36.6 C) 98 F (36.7 C) 98.4 F (36.9 C)  TempSrc: Oral  Oral Oral  SpO2: 100% 97% 97% 98%  Weight:      Height:        Intake/Output Summary (Last 24 hours) at 08/13/2023 1338 Last data filed at 08/13/2023 1300 Gross per 24 hour  Intake 1215.83 ml  Output 1000 ml  Net 215.83 ml   Filed Weights   08/12/23 1438  Weight: 81.6 kg   Examination:  General exam: Appears calm and comfortable  Respiratory system: Clear to auscultation. Respiratory effort normal. Cardiovascular system: normal S1 & S2 heard. No JVD, murmurs, rubs, gallops or clicks. No pedal edema. Gastrointestinal system: Abdomen is nondistended, soft  and nontender. No organomegaly or masses felt. Normal bowel sounds heard. Central nervous system: Alert and oriented. No focal neurological deficits. Extremities: Symmetric 5 x 5 power. Skin: No rashes, lesions or ulcers. Psychiatry: Judgement and insight appear normal. Mood & affect  appropriate.   Data Reviewed: I have personally reviewed following labs and imaging studies  CBC: Recent Labs  Lab 08/12/23 1455 08/13/23 0434  WBC 7.0 6.4  HGB 9.9* 8.7*  HCT 31.7* 27.8*  MCV 93.8 92.4  PLT 254 210    Basic Metabolic Panel: Recent Labs  Lab 08/12/23 1455 08/13/23 0434  NA 138 139  K 2.9* 2.9*  CL 99 105  CO2 27 25  GLUCOSE 142* 100*  BUN 33* 32*  CREATININE 2.28* 2.06*  CALCIUM 8.5* 7.8*    CBG: Recent Labs  Lab 08/12/23 1549  GLUCAP 106*    Recent Results (from the past 240 hours)  Culture, body fluid w Gram Stain-bottle     Status: None (Preliminary result)   Collection Time: 08/12/23 10:45 AM   Specimen: Ascitic  Result Value Ref Range Status   Specimen Description ASCITIC  Final   Special Requests BOTTLES DRAWN AEROBIC AND ANAEROBIC 10 CC  Final   Culture   Final    NO GROWTH < 24 HOURS Performed at [facility], [address] [place], [place] [address]    Report Status PENDING  Incomplete  Gram stain     Status: None   Collection Time: 08/12/23 10:45 AM   Specimen: Ascitic  Result Value Ref Range Status   Specimen Description ASCITIC  Final   Special Requests NONE  Final   Gram Stain   Final    ASCITIC CYTOSPIN SMEAR WBC PRESENT,BOTH PMN AND MONONUCLEAR NO ORGANISMS SEEN Performed at [facility], [address], [place], [place] [address]    Report Status 08/12/2023 FINAL  Final  Blood culture (routine x 2)     Status: None (Preliminary result)   Collection Time: 08/12/23  3:53 PM   Specimen: BLOOD  Result Value Ref Range Status   Specimen Description BLOOD BLOOD RIGHT FOREARM  Final   Special Requests   Final    BOTTLES DRAWN AEROBIC AND ANAEROBIC Blood Culture results may not be optimal due to an inadequate volume of blood received in culture bottles   Culture   Final    NO GROWTH < 24 HOURS Performed at [facility], [address], [place], [place] [address]    Report Status PENDING  Incomplete  Blood culture  (routine x 2)     Status: None (Preliminary result)   Collection Time: 08/12/23  3:53 PM   Specimen: BLOOD  Result Value Ref Range Status   Specimen Description BLOOD LEFT ANTECUBITAL  Final   Special Requests   Final    BOTTLES DRAWN AEROBIC AND ANAEROBIC Blood Culture results may not be optimal due to an inadequate volume of blood received in culture bottles   Culture   Final    NO GROWTH < 24 HOURS Performed at [facility], [address], [place], [place] [address]    Report Status PENDING  Incomplete     Radiology Studies: US Paracentesis Result Date: 08/12/2023 INDICATION: Cirrhosis, recurrent ascites, recent tips 07/13/2023 EXAM: ULTRASOUND GUIDED PARACENTESIS MEDICATIONS: 1% lidocaine local COMPLICATIONS: None immediate. PROCEDURE: Informed written consent was obtained from the patient after a discussion of the risks, benefits and alternatives to treatment. A timeout was performed prior to the initiation of the procedure. Initial ultrasound  scanning demonstrates a large amount of ascites within the left lower abdominal quadrant. The right lower abdomen was prepped and draped in the usual sterile fashion. 1% lidocaine was used for local anesthesia. Following this, a 6 Fr Safe-T-Centesis catheter was introduced. An ultrasound image was saved for documentation purposes. The paracentesis was performed. The catheter was removed and a dressing was applied. The patient tolerated the procedure well without immediate post procedural complication. Patient received post-procedure intravenous albumin; see nursing notes for details. FINDINGS: A total of approximately 4 L of blood tinged exudative peritoneal fluid was removed. Samples were sent to the laboratory as requested by the clinical team. IMPRESSION: Successful ultrasound-guided paracentesis yielding 4.0 liters of peritoneal fluid. Electronically Signed   By: Judie Petit.  Shick [name]   On: 08/12/2023 11:26    Scheduled Meds:  amiodarone  200 mg Oral  Daily   atorvastatin  80 mg Oral QPM   cholecalciferol  2,000 Units Oral Daily   [START ON 08/14/2023] cyanocobalamin  1,000 mcg Oral Once per day on Tuesday Friday   doxazosin  4 mg Oral q AM   empagliflozin  10 mg Oral QAC breakfast   feeding supplement  1 Container Oral Q1500   finasteride  5 mg Oral QPM   lactulose  45 g Oral TID   pantoprazole  40 mg Oral QAC breakfast   Rivaroxaban  15 mg Oral Q supper   spironolactone  25 mg Oral Daily   Continuous Infusions:  cefTRIAXone (ROCEPHIN)  IV 2 g (08/13/23 0941)     LOS: 0 days   Time spent: 55 mins  Ernie Sagrero Laural Benes, MD How to contact the [facility] Attending or Consulting provider 7A - 7P or covering provider during after hours 7P -7A, for this patient?  Check the care team in [facility] and look for a) attending/consulting TRH provider listed and b) the [facility] team listed Log into [email or web address] to find provider on call.  Locate the [facility] provider you are looking for under [facility] and page to a number that you can be directly reached. If you still have difficulty reaching the provider, please page the [facility] (Director on Call) for the Hospitalists listed on amion for assistance.  08/13/2023, 1:38 PM

## 2023-08-13 NOTE — Plan of Care (Signed)
  Problem: Acute Rehab PT Goals(only PT should resolve) Goal: Pt Will Go Supine/Side To Sit Outcome: Progressing Flowsheets (Taken 08/13/2023 1353) Pt will go Supine/Side to Sit:  Independently  with modified independence Goal: Patient Will Transfer Sit To/From Stand Outcome: Progressing Flowsheets (Taken 08/13/2023 1353) Patient will transfer sit to/from stand:  with modified independence  with supervision Goal: Pt Will Transfer Bed To Chair/Chair To Bed Outcome: Progressing Flowsheets (Taken 08/13/2023 1353) Pt will Transfer Bed to Chair/Chair to Bed:  with modified independence  with supervision Goal: Pt Will Ambulate Outcome: Progressing Flowsheets (Taken 08/13/2023 1353) Pt will Ambulate:  100 feet  with modified independence  with supervision  with rolling walker   1:53 PM, 08/13/23 Ocie Bob, MPT Physical Therapist with [facility] [phone] office [phone] mobile phone

## 2023-08-14 DIAGNOSIS — K652 Spontaneous bacterial peritonitis: Secondary | ICD-10-CM | POA: Diagnosis not present

## 2023-08-14 DIAGNOSIS — I4819 Other persistent atrial fibrillation: Secondary | ICD-10-CM | POA: Diagnosis not present

## 2023-08-14 DIAGNOSIS — I2510 Atherosclerotic heart disease of native coronary artery without angina pectoris: Secondary | ICD-10-CM

## 2023-08-14 DIAGNOSIS — R188 Other ascites: Secondary | ICD-10-CM | POA: Diagnosis not present

## 2023-08-14 LAB — COMPREHENSIVE METABOLIC PANEL
ALT: 34 U/L (ref 0–44)
AST: 59 U/L — ABNORMAL HIGH (ref 15–41)
Albumin: 2.9 g/dL — ABNORMAL LOW (ref 3.5–5.0)
Alkaline Phosphatase: 59 U/L (ref 38–126)
Anion gap: 9 (ref 5–15)
BUN: 27 mg/dL — ABNORMAL HIGH (ref 8–23)
CO2: 24 mmol/L (ref 22–32)
Calcium: 8.1 mg/dL — ABNORMAL LOW (ref 8.9–10.3)
Chloride: 105 mmol/L (ref 98–111)
Creatinine, Ser: 1.92 mg/dL — ABNORMAL HIGH (ref 0.61–1.24)
GFR, Estimated: 35 mL/min — ABNORMAL LOW (ref >60)
Glucose, Bld: 92 mg/dL (ref 70–99)
Potassium: 3.4 mmol/L — ABNORMAL LOW (ref 3.5–5.1)
Sodium: 138 mmol/L (ref 135–145)
Total Bilirubin: 0.4 mg/dL (ref 0.0–1.2)
Total Protein: 5.3 g/dL — ABNORMAL LOW (ref 6.5–8.1)

## 2023-08-14 LAB — MAGNESIUM: Magnesium: 2.1 mg/dL (ref 1.7–2.4)

## 2023-08-14 LAB — PROTIME-INR
INR: 3.3 — ABNORMAL HIGH (ref 0.8–1.2)
Prothrombin Time: 33.4 s — ABNORMAL HIGH (ref 11.4–15.2)

## 2023-08-14 MED ORDER — ALBUMIN HUMAN 25 % IV SOLN
12.5000 g | Freq: Once | INTRAVENOUS | Status: AC
  Administered 2023-08-15: 12.5 g via INTRAVENOUS
  Filled 2023-08-14: qty 50

## 2023-08-14 MED ORDER — ALBUMIN HUMAN 25 % IV SOLN
25.0000 g | Freq: Four times a day (QID) | INTRAVENOUS | Status: AC
  Administered 2023-08-14 – 2023-08-15 (×3): 25 g via INTRAVENOUS
  Filled 2023-08-14 (×3): qty 100

## 2023-08-14 NOTE — Progress Notes (Addendum)
 Gastroenterology Progress Note   Referring Provider: No ref. provider found Primary Care Physician:  Carylon Perches, MD Primary Gastroenterologist:  Dolores Frame, MD   Patient ID: BLADYN TIPPS; [id number]; [birth year]   Subjective   Feeling okay this afternoon.  Still has some mild abdominal tenderness but improved from yesterday.  Denies any nausea, vomiting, dysphagia, chest pain, shortness of breath.  Had bowel movement yesterday.  Wife states abdomen looks better than yesterday.   Objective   Vital signs in last 24 hours Temp:  [98 F (36.7 C)-98.4 F (36.9 C)] 98.1 F (36.7 C) (03/14 0432) Pulse Rate:  [47-49] 47 (03/14 0432) Resp:  [17-18] 17 (03/14 0432) BP: (103-108)/(44-72) 106/53 (03/14 0432) SpO2:  [96 %-99 %] 96 % (03/14 0432) Last BM Date : 08/11/23  Physical Exam General:   Alert and oriented, pleasant Head:  Normocephalic and atraumatic. Eyes:  No icterus, sclera clear. Conjuctiva pink.  Mouth:  Without lesions, mucosa pink and moist.  Neck:  Supple, without thyromegaly or masses.  Abdomen:  Soft, non-tender, non-distended. No HSM or hernias noted. No rebound or guarding. No masses appreciated  Extremities:  Without clubbing or edema. Neurologic:  Alert and  oriented x4;  grossly normal neurologically.  No asterixis. Psych:  Alert and cooperative. Normal mood and affect.  Intake/Output from previous day: 03/13 0701 - 03/14 0700 In: 480 [P.O.:480] Out: -  Intake/Output this shift: Total I/O In: 240 [P.O.:240] Out: -   Lab Results  Recent Labs    08/12/23 1455 08/13/23 0434  WBC 7.0 6.4  HGB 9.9* 8.7*  HCT 31.7* 27.8*  PLT 254 210   BMET Recent Labs    08/12/23 1455 08/13/23 0434 08/14/23 0443  NA 138 139 138  K 2.9* 2.9* 3.4*  CL 99 105 105  CO2 27 25 24   GLUCOSE 142* 100* 92  BUN 33* 32* 27*  CREATININE 2.28* 2.06* 1.92*  CALCIUM 8.5* 7.8* 8.1*   LFT Recent Labs    08/12/23 1455 08/13/23 0434 08/14/23 0443  PROT  5.9* 5.0* 5.3*  ALBUMIN 2.6* 2.2* 2.9*  AST 67* 66* 59*  ALT 36 36 34  ALKPHOS 85 68 59  BILITOT 0.6 0.5 0.4   PT/INR Recent Labs    08/14/23 0443  LABPROT 33.4*  INR 3.3*   Hepatitis Panel No results for input(s): "HEPBSAG", "HCVAB", "HEPAIGM", "HEPBIGM" in the last 72 hours.   Studies/Results CT ABDOMEN PELVIS W CONTRAST Result Date: 08/13/2023 CLINICAL DATA:  Abdominal pain, acute, nonlocalized umbilical pain with erythema EXAM: CT ABDOMEN AND PELVIS WITH CONTRAST TECHNIQUE: Multidetector CT imaging of the abdomen and pelvis was performed using the standard protocol following bolus administration of intravenous contrast. RADIATION DOSE REDUCTION: This exam was performed according to the departmental dose-optimization program which includes automated exposure control, adjustment of the mA and/or kV according to patient size and/or use of iterative reconstruction technique. CONTRAST:  80mL OMNIPAQUE IOHEXOL 300 MG/ML  SOLN COMPARISON:  06/26/2023 FINDINGS: Lower chest: Small pleural effusions, right greater than left, grossly stable. Dependent atelectasis in the lung bases left greater than right as before. Sternotomy wires. Hepatobiliary: Patent TIPS. No focal liver lesion or biliary ductal dilatation. Gallbladder incompletely distended, without calcified gallstone. Pancreas: Unremarkable. No pancreatic ductal dilatation or surrounding inflammatory changes. Spleen: Normal in size without focal abnormality. Adrenals/Urinary Tract: No adrenal mass. Marked right renal parenchymal atrophy as before. 3 mm calculus, peripherally in the lower pole left renal collecting system. No hydronephrosis or ureterectasis. Multiple cortical lesions in  both kidneys, some of which can be characterized as cysts, largest 3.3 cm 6 HU left upper pole; no followup recommended. Urinary bladder is decompressed. Stomach/Bowel: Stomach is partially distended, without acute finding. Small bowel is nondilated. Appendix not  identified. The colon is partially distended by gas and fluid. Scattered diverticula without adjacent inflammatory change. Vascular/Lymphatic: Moderate calcified aortoiliac plaque without aneurysm. Patent interval TIPS placement from the main portal vein through the middle hepatic vein. Native portal vein and SMV remain patent. Splenic vein patent. No abdominal or pelvic adenopathy. Reproductive: Prostate is unremarkable. Other: Scattered small to moderate pelvic, perisplenic and perihepatic ascites. Fluid distends umbilical hernia. No free air. Musculoskeletal: Stable right hip arthroplasty. Mild left hip DJD. Multi level lumbar spondylitic change. Slight progression of subacute L1 vertebral compression deformity. Sternotomy wires. IMPRESSION: 1. No acute findings. 2. Interval TIPS, remains patent. 3. Small-moderate ascites, decreased from previous. 4. Stable small pleural effusions. 5. Nonobstructive left nephrolithiasis. 6.  Aortic Atherosclerosis (ICD10-I70.0). Electronically Signed   By: Corlis Leak [name]   On: 08/13/2023 21:17   US Paracentesis Result Date: 08/12/2023 INDICATION: Cirrhosis, recurrent ascites, recent tips 07/13/2023 EXAM: ULTRASOUND GUIDED PARACENTESIS MEDICATIONS: 1% lidocaine local COMPLICATIONS: None immediate. PROCEDURE: Informed written consent was obtained from the patient after a discussion of the risks, benefits and alternatives to treatment. A timeout was performed prior to the initiation of the procedure. Initial ultrasound scanning demonstrates a large amount of ascites within the left lower abdominal quadrant. The right lower abdomen was prepped and draped in the usual sterile fashion. 1% lidocaine was used for local anesthesia. Following this, a 6 Fr Safe-T-Centesis catheter was introduced. An ultrasound image was saved for documentation purposes. The paracentesis was performed. The catheter was removed and a dressing was applied. The patient tolerated the procedure well without  immediate post procedural complication. Patient received post-procedure intravenous albumin; see nursing notes for details. FINDINGS: A total of approximately 4 L of blood tinged exudative peritoneal fluid was removed. Samples were sent to the laboratory as requested by the clinical team. IMPRESSION: Successful ultrasound-guided paracentesis yielding 4.0 liters of peritoneal fluid. Electronically Signed   By: Judie Petit.  Shick [name]   On: 08/12/2023 11:26    Assessment  83 y.o. 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  SBP: -Multiple hospitalizations for concern for SBP, previously asymptomatic -Multiple prior cultures with no growth and no identifiable organism -Culture this admission remains with no growth to date at 2 days -In the past as well as placed on vancomycin after receiving IV Rocephin and then switched to Cipro for SBP prophylaxis outpatient however given concern for QT prolongation he was switched to Bactrim -Seen by ID in August 2021 which patient and family report that given no growth on cultures from prior episodes of possible SBP that he could discontinue Bactrim although not stated in their note, Dr. Luciana Axe with ID has stated this current episode appears to be true SBP and would recommend ongoing prophylaxis post hospitalization -Prior AFB and fungal cultures of ascitic fluid negative -Received 100 g of albumin yesterday, will plan for albumin again tomorrow (day 3) -Creatinine improving  Decompensated cirrhosis: -Current MELD 25, previously 15 in February (likely driven by INR) -Course complicated by refractory  ascites and recurrent suspected SBP -Underwent TIPS placement February  2025 -Continues to have some ongoing mild recurring ascites despite home diuretics -Maintain on lactulose, now for constipation and not HE -No prior documented HE, need to continue to monitor -low threshold for Xifaxan  Umbilical hernia: -erythematous and tender -CT A/P negative for incarcerated umbilical hernia, fluid (ascites) filled. -per Dr. Tasia Catchings, management pr medicine team   Plan / Recommendations  Indefinite prophylaxis for SBP going forward on discharge (Bactrim DS as previously prescribed) Albumin 25% 1g/kg tomorrow (day 3) Lactulose 40g TID Rocephin 2g IV for total 5 days 2g sodium diet Monitor for HE, low threshold to start Xifaxin INR tomorrow. May consider vitamin K if remains elevated.     LOS: 1 day    08/14/2023, 11:03 AM   Brooke Bonito, MSN, FNP-BC, AGACNP-BC [facility]

## 2023-08-14 NOTE — Progress Notes (Signed)
 PROGRESS NOTE  Caleb Wolf  UUV:[id number] DOB: 09-30-1940 DOA: 08/12/2023 PCP: Carylon Perches, MD   Chief Complaint  Patient presents with   Weakness   Level of care: Med-Surg  Brief Admission History:   83 y.o. 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 -Patient is treated with recurrent paracentesis for reaccumulating ascites, most recently this morning, it has been drawn of the abdomen, was cloudy, and results came back with significantly elevated white blood cells, so he was called back and told to come to emergency room room for possible infection, patient reports generalized weakness, fatigue, reports he is compliant with his Lasix, Aldactone and lactulose, reports mild abdominal discomfort, but denies fever, chills. -Paracentesis performed earlier today showing 1790 white blood cells, Gram stain is negative, labs significant for low potassium at 2.9, creatinine at 2.28 up from 1.9 baseline, but cell count within normal limit, lactic acid elevated at 2.9, patient received IV Rocephin after blood cultures were sent, and [facility] hospitalist consulted to admit.   Assessment and Plan:  Recurrent ascites Recurrent SBP -Patient with known history of liver cirrhosis, history of recurrent SBP in the past. 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 -paracentesis 3/12 with 1794 nucleated cells. -Follow-up blood cultures, follow on final peritoneal fluid cultures, so far negative Gram stain. -Continue with IV Rocephin 2 g daily x 5 days -Patient appears to be having recurrent SBP, although his previous paracentesis with elevated nucleated cells, will consult GI to see if SBP prophylaxis Cipro and Bactrim is appropriate here   Liver cirrhosis -[place] history of liver cirrhosis in the past, recurrent ascites and hepatic encephalopathy status post TIPS. -Continue with home medications including lactulose,  Aldactone, furosemide.   Proximal A-fib on chronic anticoagulation -Continue with amiodarone -Continue with Xarelto   Hyperlipidemia -Continue atorvastatin   BPH -Continue doxazosin and finasteride   GERD -Continue Protonix   Weakness -PT eval requested    Lactic acidosis -Hold Lasix, for brief holiday, resume 3/14 home dosing   Hypokalemia -replaced   AKI on CKD stage IIIb -Holding Lasix for brief  holiday, resume 3/14  home dosing    DVT prophylaxis: rivaroxaban Code Status: Full  Family Communication: wife at bedside  Disposition: anticipate home   Consultants:  GI  Procedures:  Paracentesis 3/12 Antimicrobials:  Ceftriaxone 3/12>>   Subjective: No specific complaints.   Objective: Vitals:   08/13/23 0455 08/13/23 1303 08/13/23 2020 08/14/23 0432  BP: (!) 113/48 (!) 108/44 103/72 (!) 106/53  Pulse: (!) 49 (!) 49 (!) 49 (!) 47  Resp: 18 18 18 17   Temp: 98 F (36.7 C) 98.4 F (36.9 C) 98 F (36.7 C) 98.1 F (36.7 C)  TempSrc: Oral Oral Oral   SpO2: 97% 98% 99% 96%  Weight:      Height:        Intake/Output Summary (Last 24 hours) at 08/14/2023 1804 Last data filed at 08/14/2023 1214 Gross per 24 hour  Intake 720 ml  Output --  Net 720 ml   Filed Weights   08/12/23 1438  Weight: 81.6 kg   Examination:  General exam: Appears calm and comfortable  Respiratory system: Clear to auscultation. Respiratory effort normal. Cardiovascular system: normal S1 & S2 heard. No JVD, murmurs, rubs, gallops or clicks. No pedal edema. Gastrointestinal system: Abdomen is nondistended, soft and nontender. No organomegaly or masses felt. Normal bowel sounds heard. Central nervous system: Alert and  oriented. No focal neurological deficits. Extremities: Symmetric 5 x 5 power. Skin: No rashes, lesions or ulcers. Psychiatry: Judgement and insight appear normal. Mood & affect appropriate.   Data Reviewed: I have personally reviewed following labs and imaging  studies  CBC: Recent Labs  Lab 08/12/23 1455 08/13/23 0434  WBC 7.0 6.4  HGB 9.9* 8.7*  HCT 31.7* 27.8*  MCV 93.8 92.4  PLT 254 210    Basic Metabolic Panel: Recent Labs  Lab 08/12/23 1455 08/13/23 0434 08/14/23 0443  NA 138 139 138  K 2.9* 2.9* 3.4*  CL 99 105 105  CO2 27 25 24   GLUCOSE 142* 100* 92  BUN 33* 32* 27*  CREATININE 2.28* 2.06* 1.92*  CALCIUM 8.5* 7.8* 8.1*  MG  --   --  2.1    CBG: Recent Labs  Lab 08/12/23 1549  GLUCAP 106*    Recent Results (from the past 240 hours)  Culture, body fluid w Gram Stain-bottle     Status: None (Preliminary result)   Collection Time: 08/12/23 10:45 AM   Specimen: Ascitic  Result Value Ref Range Status   Specimen Description ASCITIC  Final   Special Requests BOTTLES DRAWN AEROBIC AND ANAEROBIC 10 CC  Final   Culture   Final    NO GROWTH 2 DAYS Performed at [facility], [address] [place], [place] [address]    Report Status PENDING  Incomplete  Gram stain     Status: None   Collection Time: 08/12/23 10:45 AM   Specimen: Ascitic  Result Value Ref Range Status   Specimen Description ASCITIC  Final   Special Requests NONE  Final   Gram Stain   Final    ASCITIC CYTOSPIN SMEAR WBC PRESENT,BOTH PMN AND MONONUCLEAR NO ORGANISMS SEEN Performed at [facility], [address], [place], [place] [address]    Report Status 08/12/2023 FINAL  Final  Blood culture (routine x 2)     Status: None (Preliminary result)   Collection Time: 08/12/23  3:53 PM   Specimen: BLOOD  Result Value Ref Range Status   Specimen Description BLOOD BLOOD RIGHT FOREARM  Final   Special Requests   Final    BOTTLES DRAWN AEROBIC AND ANAEROBIC Blood Culture results may not be optimal due to an inadequate volume of blood received in culture bottles   Culture   Final    NO GROWTH 2 DAYS Performed at [facility], [address], [place], [place] [address]    Report Status PENDING  Incomplete  Blood culture (routine x 2)     Status: None  (Preliminary result)   Collection Time: 08/12/23  3:53 PM   Specimen: BLOOD  Result Value Ref Range Status   Specimen Description BLOOD LEFT ANTECUBITAL  Final   Special Requests   Final    BOTTLES DRAWN AEROBIC AND ANAEROBIC Blood Culture results may not be optimal due to an inadequate volume of blood received in culture bottles   Culture   Final    NO GROWTH 2 DAYS Performed at [facility], [address] [place], [place] [address]    Report Status PENDING  Incomplete     Radiology Studies: CT ABDOMEN PELVIS W CONTRAST Result Date: 08/13/2023 CLINICAL DATA:  Abdominal pain, acute, nonlocalized umbilical pain with erythema EXAM: CT ABDOMEN AND PELVIS WITH CONTRAST TECHNIQUE: Multidetector CT imaging of the abdomen and pelvis was performed using the standard protocol following bolus administration of intravenous contrast. RADIATION DOSE REDUCTION: This exam was performed according to  the departmental dose-optimization program which includes automated exposure control, adjustment of the mA and/or kV according to patient size and/or use of iterative reconstruction technique. CONTRAST:  80mL OMNIPAQUE IOHEXOL 300 MG/ML  SOLN COMPARISON:  06/26/2023 FINDINGS: Lower chest: Small pleural effusions, right greater than left, grossly stable. Dependent atelectasis in the lung bases left greater than right as before. Sternotomy wires. Hepatobiliary: Patent TIPS. No focal liver lesion or biliary ductal dilatation. Gallbladder incompletely distended, without calcified gallstone. Pancreas: Unremarkable. No pancreatic ductal dilatation or surrounding inflammatory changes. Spleen: Normal in size without focal abnormality. Adrenals/Urinary Tract: No adrenal mass. Marked right renal parenchymal atrophy as before. 3 mm calculus, peripherally in the lower pole left renal collecting system. No hydronephrosis or ureterectasis. Multiple cortical lesions in both kidneys, some of which can be characterized as cysts, largest  3.3 cm 6 HU left upper pole; no followup recommended. Urinary bladder is decompressed. Stomach/Bowel: Stomach is partially distended, without acute finding. Small bowel is nondilated. Appendix not identified. The colon is partially distended by gas and fluid. Scattered diverticula without adjacent inflammatory change. Vascular/Lymphatic: Moderate calcified aortoiliac plaque without aneurysm. Patent interval TIPS placement from the main portal vein through the middle hepatic vein. Native portal vein and SMV remain patent. Splenic vein patent. No abdominal or pelvic adenopathy. Reproductive: Prostate is unremarkable. Other: Scattered small to moderate pelvic, perisplenic and perihepatic ascites. Fluid distends umbilical hernia. No free air. Musculoskeletal: Stable right hip arthroplasty. Mild left hip DJD. Multi level lumbar spondylitic change. Slight progression of subacute L1 vertebral compression deformity. Sternotomy wires. IMPRESSION: 1. No acute findings. 2. Interval TIPS, remains patent. 3. Small-moderate ascites, decreased from previous. 4. Stable small pleural effusions. 5. Nonobstructive left nephrolithiasis. 6.  Aortic Atherosclerosis (ICD10-I70.0). Electronically Signed   By: Corlis Leak [name]   On: 08/13/2023 21:17    Scheduled Meds:  amiodarone  200 mg Oral Daily   atorvastatin  80 mg Oral QPM   cholecalciferol  2,000 Units Oral Daily   cyanocobalamin  1,000 mcg Oral Once per day on Tuesday Friday   doxazosin  4 mg Oral q AM   empagliflozin  10 mg Oral QAC breakfast   feeding supplement  1 Container Oral Q1500   finasteride  5 mg Oral QPM   furosemide  80 mg Oral BID   lactulose  45 g Oral TID   pantoprazole  40 mg Oral QAC breakfast   Rivaroxaban  15 mg Oral Q supper   sertraline  25 mg Oral QHS   spironolactone  25 mg Oral Daily   Continuous Infusions:  [START ON 08/15/2023] albumin human     Followed by   Melene Muller ON 08/15/2023] albumin human     cefTRIAXone (ROCEPHIN)  IV 2 g  (08/14/23 0823)    LOS: 1 day   Time spent: 55 mins  Karren Newland Laural Benes, MD How to contact the [facility] Attending or Consulting provider 7A - 7P or covering provider during after hours 7P -7A, for this patient?  Check the care team in [facility] and look for a) attending/consulting [facility] provider listed and b) the [facility] team listed Log into [email or web address] to find provider on call.  Locate the [facility] provider you are looking for under [facility] and page to a number that you can be directly reached. If you still have difficulty reaching the provider, please page the [facility] (Director on Call) for the Hospitalists listed on amion for assistance.  08/14/2023, 6:04 PM

## 2023-08-15 DIAGNOSIS — R188 Other ascites: Secondary | ICD-10-CM | POA: Diagnosis not present

## 2023-08-15 DIAGNOSIS — K652 Spontaneous bacterial peritonitis: Secondary | ICD-10-CM | POA: Diagnosis not present

## 2023-08-15 DIAGNOSIS — I4819 Other persistent atrial fibrillation: Secondary | ICD-10-CM | POA: Diagnosis not present

## 2023-08-15 LAB — CBC
HCT: 28.9 % — ABNORMAL LOW (ref 39.0–52.0)
Hemoglobin: 8.6 g/dL — ABNORMAL LOW (ref 13.0–17.0)
MCH: 28.7 pg (ref 26.0–34.0)
MCHC: 29.8 g/dL — ABNORMAL LOW (ref 30.0–36.0)
MCV: 96.3 fL (ref 80.0–100.0)
Platelets: 228 10*3/uL (ref 150–400)
RBC: 3 MIL/uL — ABNORMAL LOW (ref 4.22–5.81)
RDW: 15.3 % (ref 11.5–15.5)
WBC: 6.7 10*3/uL (ref 4.0–10.5)
nRBC: 0 % (ref 0.0–0.2)

## 2023-08-15 LAB — COMPREHENSIVE METABOLIC PANEL
ALT: 45 U/L — ABNORMAL HIGH (ref 0–44)
AST: 82 U/L — ABNORMAL HIGH (ref 15–41)
Albumin: 2.9 g/dL — ABNORMAL LOW (ref 3.5–5.0)
Alkaline Phosphatase: 59 U/L (ref 38–126)
Anion gap: 9 (ref 5–15)
BUN: 24 mg/dL — ABNORMAL HIGH (ref 8–23)
CO2: 22 mmol/L (ref 22–32)
Calcium: 8.2 mg/dL — ABNORMAL LOW (ref 8.9–10.3)
Chloride: 104 mmol/L (ref 98–111)
Creatinine, Ser: 2.09 mg/dL — ABNORMAL HIGH (ref 0.61–1.24)
GFR, Estimated: 31 mL/min — ABNORMAL LOW (ref >60)
Glucose, Bld: 99 mg/dL (ref 70–99)
Potassium: 3.1 mmol/L — ABNORMAL LOW (ref 3.5–5.1)
Sodium: 135 mmol/L (ref 135–145)
Total Bilirubin: 0.4 mg/dL (ref 0.0–1.2)
Total Protein: 5.3 g/dL — ABNORMAL LOW (ref 6.5–8.1)

## 2023-08-15 LAB — PROTIME-INR
INR: 3 — ABNORMAL HIGH (ref 0.8–1.2)
Prothrombin Time: 31.8 s — ABNORMAL HIGH (ref 11.4–15.2)

## 2023-08-15 MED ORDER — VANCOMYCIN HCL IN DEXTROSE 1-5 GM/200ML-% IV SOLN
1000.0000 mg | Freq: Once | INTRAVENOUS | Status: AC
  Administered 2023-08-15: 1000 mg via INTRAVENOUS
  Filled 2023-08-15: qty 200

## 2023-08-15 MED ORDER — FENTANYL CITRATE PF 50 MCG/ML IJ SOSY: 25.0000 ug | PREFILLED_SYRINGE | INTRAMUSCULAR | Status: DC | PRN

## 2023-08-15 MED ORDER — POTASSIUM CHLORIDE CRYS ER 20 MEQ PO TBCR
40.0000 meq | EXTENDED_RELEASE_TABLET | Freq: Every day | ORAL | Status: DC
  Administered 2023-08-15: 40 meq via ORAL
  Filled 2023-08-15: qty 2

## 2023-08-15 MED ORDER — POTASSIUM CHLORIDE CRYS ER 20 MEQ PO TBCR
40.0000 meq | EXTENDED_RELEASE_TABLET | Freq: Two times a day (BID) | ORAL | Status: DC
  Administered 2023-08-15 – 2023-08-19 (×8): 40 meq via ORAL
  Filled 2023-08-15 (×8): qty 2

## 2023-08-15 MED ORDER — VANCOMYCIN HCL 750 MG/150ML IV SOLN
750.0000 mg | Freq: Once | INTRAVENOUS | Status: AC
  Administered 2023-08-15: 750 mg via INTRAVENOUS
  Filled 2023-08-15: qty 150

## 2023-08-15 MED ORDER — VANCOMYCIN HCL 1250 MG/250ML IV SOLN
1250.0000 mg | INTRAVENOUS | Status: DC
  Administered 2023-08-17: 1250 mg via INTRAVENOUS
  Filled 2023-08-15: qty 250

## 2023-08-15 MED ORDER — PIPERACILLIN-TAZOBACTAM 3.375 G IVPB
3.3750 g | Freq: Three times a day (TID) | INTRAVENOUS | Status: DC
  Administered 2023-08-15 – 2023-08-18 (×9): 3.375 g via INTRAVENOUS
  Filled 2023-08-15 (×9): qty 50

## 2023-08-15 MED ORDER — VANCOMYCIN HCL 1250 MG/250ML IV SOLN: 1250.0000 mg | INTRAVENOUS | Status: DC

## 2023-08-15 MED ORDER — VANCOMYCIN HCL 1750 MG/350ML IV SOLN: 1750.0000 mg | Freq: Once | INTRAVENOUS | Status: DC

## 2023-08-15 MED ORDER — ONDANSETRON HCL 4 MG/2ML IJ SOLN
4.0000 mg | Freq: Four times a day (QID) | INTRAMUSCULAR | Status: DC | PRN
  Administered 2023-08-15 – 2023-08-16 (×2): 4 mg via INTRAVENOUS
  Filled 2023-08-15 (×2): qty 2

## 2023-08-15 MED ORDER — POTASSIUM CHLORIDE CRYS ER 20 MEQ PO TBCR: 40.0000 meq | EXTENDED_RELEASE_TABLET | Freq: Once | ORAL | Status: DC

## 2023-08-15 NOTE — Progress Notes (Signed)
 Mobility Specialist Progress Note:    08/15/23 1307  Mobility  Activity Ambulated with assistance in hallway  Level of Assistance Standby assist, set-up cues, supervision of patient - no hands on  Assistive Device Front wheel walker  Distance Ambulated (ft) 70 ft  Range of Motion/Exercises Active;All extremities  Activity Response Tolerated well  Mobility Referral Yes  Mobility visit 1 Mobility  Mobility Specialist Start Time (ACUTE ONLY) 1307  Mobility Specialist Stop Time (ACUTE ONLY) 1320  Mobility Specialist Time Calculation (min) (ACUTE ONLY) 13 min   Pt received in bed, wife in room. Agreeable to mobility, required SBA to stand and ambulate with RW. Tolerated well,asx throughout. Left pt sitting EOB, al needs met.  Caleb Wolf Mobility Specialist Please contact via Special educational needs teacher or  Rehab office at [phone]

## 2023-08-15 NOTE — Progress Notes (Signed)
 Patient has no complaints today other than continued pain in the periumbilical area.  Day 3/4 ceftriaxone.  Vital signs in last 24 hours: Temp:  [98.2 F (36.8 C)-98.6 F (37 C)] 98.2 F (36.8 C) (03/15 0408) Pulse Rate:  [51-58] 58 (03/15 0408) Resp:  [14-16] 16 (03/15 0408) BP: (119-144)/(46-52) 144/52 (03/15 0408) SpO2:  [96 %-97 %] 97 % (03/15 0408) Last BM Date : 08/11/23 General:   Alert,   pleasant and cooperative in NAD;  Abdomen: Obese mildly distended.  Patient has a "doughnut size "area of intense erythema with the umbilicus in the center.  Firm and very tender to touch (patient pulled my hand away).  Intake/Output from previous day: 03/14 0701 - 03/15 0700 In: 960 [P.O.:960] Out: -  Intake/Output this shift: Total I/O In: -  Out: 100 [Urine:100]  Lab Results: Recent Labs    08/12/23 1455 08/13/23 0434  WBC 7.0 6.4  HGB 9.9* 8.7*  HCT 31.7* 27.8*  PLT 254 210   BMET Recent Labs    08/13/23 0434 08/14/23 0443 08/15/23 0338  NA 139 138 135  K 2.9* 3.4* 3.1*  CL 105 105 104  CO2 25 24 22   GLUCOSE 100* 92 99  BUN 32* 27* 24*  CREATININE 2.06* 1.92* 2.09*  CALCIUM 7.8* 8.1* 8.2*   LFT Recent Labs    08/15/23 0338  PROT 5.3*  ALBUMIN 2.9*  AST 82*  ALT 45*  ALKPHOS 59  BILITOT 0.4   PT/INR Recent Labs    08/14/23 0443 08/15/23 0338  LABPROT 33.4* 31.8*  INR 3.3* 3.0*   Hepatitis Panel No results for input(s): "HEPBSAG", "HCVAB", "HEPAIGM", "HEPBIGM" in the last 72 hours. C-Diff No results for input(s): "CDIFFTOX" in the last 72 hours.  Studies/Results: CT ABDOMEN PELVIS W CONTRAST Result Date: 08/13/2023 CLINICAL DATA:  Abdominal pain, acute, nonlocalized umbilical pain with erythema EXAM: CT ABDOMEN AND PELVIS WITH CONTRAST TECHNIQUE: Multidetector CT imaging of the abdomen and pelvis was performed using the standard protocol following bolus administration of intravenous contrast. RADIATION DOSE REDUCTION: This exam was performed  according to the departmental dose-optimization program which includes automated exposure control, adjustment of the mA and/or kV according to patient size and/or use of iterative reconstruction technique. CONTRAST:  80mL OMNIPAQUE IOHEXOL 300 MG/ML  SOLN COMPARISON:  06/26/2023 FINDINGS: Lower chest: Small pleural effusions, right greater than left, grossly stable. Dependent atelectasis in the lung bases left greater than right as before. Sternotomy wires. Hepatobiliary: Patent TIPS. No focal liver lesion or biliary ductal dilatation. Gallbladder incompletely distended, without calcified gallstone. Pancreas: Unremarkable. No pancreatic ductal dilatation or surrounding inflammatory changes. Spleen: Normal in size without focal abnormality. Adrenals/Urinary Tract: No adrenal mass. Marked right renal parenchymal atrophy as before. 3 mm calculus, peripherally in the lower pole left renal collecting system. No hydronephrosis or ureterectasis. Multiple cortical lesions in both kidneys, some of which can be characterized as cysts, largest 3.3 cm 6 HU left upper pole; no followup recommended. Urinary bladder is decompressed. Stomach/Bowel: Stomach is partially distended, without acute finding. Small bowel is nondilated. Appendix not identified. The colon is partially distended by gas and fluid. Scattered diverticula without adjacent inflammatory change. Vascular/Lymphatic: Moderate calcified aortoiliac plaque without aneurysm. Patent interval TIPS placement from the main portal vein through the middle hepatic vein. Native portal vein and SMV remain patent. Splenic vein patent. No abdominal or pelvic adenopathy. Reproductive: Prostate is unremarkable. Other: Scattered small to moderate pelvic, perisplenic and perihepatic ascites. Fluid distends umbilical hernia. No free air.  Musculoskeletal: Stable right hip arthroplasty. Mild left hip DJD. Multi level lumbar spondylitic change. Slight progression of subacute L1 vertebral  compression deformity. Sternotomy wires. IMPRESSION: 1. No acute findings. 2. Interval TIPS, remains patent. 3. Small-moderate ascites, decreased from previous. 4. Stable small pleural effusions. 5. Nonobstructive left nephrolithiasis. 6.  Aortic Atherosclerosis (ICD10-I70.0). Electronically Signed   By: Corlis Leak [name]   On: 08/13/2023 21:17    Impression:  Third episode of SBP/CNNA; cultures negative Day 3/4 ceftriaxone Second dose of IV albumin today  Slight tick up in serum creatinine on diuretic therapy; potassium 3.1  Progressive tenderness erythema and subcutaneous inflammation is seen in periumbilical area  Reviewed CT scan from 2 days ago with Dr. Mayra Reel in [place].  Compared to January study.  Enlarging fluid collection around the umbilicus/ progressive inflammation.  This process most consistent with cellulitis.   Would query if this process has been the nidus for recurrent SBP via translocation of bacteria.  Recommendations:  -Watch creatinine closely; may need to back off a bit on the furosemide  -Management of hypokalemia per attending  -For SBP, finish out 5-day course of ceftriaxone; antibiotic regimen may need to be adjusted moving forward given presence of cellulitis.  -Ultimately, patient will need to be on Bactrim DS daily indefinitely as SBP prophylaxis.  -Discussed with Drs Laural Benes and Pappayliou electronically.

## 2023-08-15 NOTE — Care Management Important Message (Signed)
 Important Message  Patient Details  Name: Caleb Wolf MRN: [id number] Date of Birth: [birth year]   Important Message Given:  N/A - LOS <3 / Initial given by admissions     Corey Harold 08/15/2023, 10:59 AM

## 2023-08-15 NOTE — Progress Notes (Signed)
 Mobility Specialist Progress Note:    08/15/23 0920  Mobility  Activity Ambulated with assistance to bathroom;Ambulated with assistance in room;Stood at bedside  Level of Assistance Standby assist, set-up cues, supervision of patient - no hands on  Assistive Device Front wheel walker  Distance Ambulated (ft) 40 ft  Range of Motion/Exercises Active;All extremities  Activity Response Tolerated well  Mobility Referral Yes  Mobility visit 1 Mobility  Mobility Specialist Start Time (ACUTE ONLY) 0920  Mobility Specialist Stop Time (ACUTE ONLY) 0940  Mobility Specialist Time Calculation (min) (ACUTE ONLY) 20 min   Pt received in bed, wife in room. Agreeable to mobility, required SBA to stand and ambulate with RW. Tolerated well,asx throughout. Returned pt sitting EOB, all needs met.  Chrishun Scheer Mobility Specialist Please contact via Special educational needs teacher or  Rehab office at [phone]

## 2023-08-15 NOTE — Progress Notes (Addendum)
 PROGRESS NOTE  TREA LATNER  VZD:[id number] DOB: [birth year] DOA: 08/12/2023 PCP: Carylon Perches, MD   Chief Complaint  Patient presents with   Weakness   Level of care: Med-Surg  Brief Admission History:   83 y.o. 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 -Patient is treated with recurrent paracentesis for reaccumulating ascites, most recently this morning, it has been drawn of the abdomen, was cloudy, and results came back with significantly elevated white blood cells, so he was called back and told to come to emergency room room for possible infection, patient reports generalized weakness, fatigue, reports he is compliant with his Lasix, Aldactone and lactulose, reports mild abdominal discomfort, but denies fever, chills. -Paracentesis performed earlier today showing 1790 white blood cells, Gram stain is negative, labs significant for low potassium at 2.9, creatinine at 2.28 up from 1.9 baseline, but cell count within normal limit, lactic acid elevated at 2.9, patient received IV Rocephin after blood cultures were sent, and [facility] hospitalist consulted to admit.   Assessment and Plan:  Recurrent ascites Recurrent SBP -Patient with known history of liver cirrhosis, history of recurrent SBP in the past. 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 -paracentesis 3/12 with 1794 nucleated cells. -Follow-up blood cultures, follow on final peritoneal fluid cultures, so far negative Gram stain. -Continue with IV Rocephin 2 g daily x 5 days - last dose to be given on 3/16.  -Patient appears to be having recurrent SBP, although his previous paracentesis with elevated nucleated cells, will consult GI to see if SBP prophylaxis Cipro and Bactrim is appropriate here He is a little bit more confused today, add on an ammonia level -ID recommended resuming his daily Bactrim DS for SBP prevention indefinitely    Liver  cirrhosis -[place] history of liver cirrhosis in the past, recurrent ascites and hepatic encephalopathy status post TIPS. -Continue with home medications including lactulose, Aldactone, furosemide.  Periumbilical Cellulitis -discussed with Dr. Jena Gauss, we are requesting surgery evaluation  -hold rivaroxaban for now until surgery evaluation  -reached out to Dr. Robyne Peers to request consultation and broadened antibiotics coverage zosyn per pharm D dosing requested -added CBC to labs, recheck in AM  Elevated PT/INR -INR up to 3, difficult to interpret significance of this as he is on rivaroxaban, no bleeding complications -defer vitamin K to GI service   Proximal A-fib on chronic anticoagulation -Continue with amiodarone -Continue with rivaroxaban   Hyperlipidemia -Continue atorvastatin   BPH -Continue doxazosin and finasteride   GERD -Continue Protonix   Weakness -PT eval requested    Lactic acidosis -Hold Lasix, for brief holiday, resume 3/14 home dosing   Hypokalemia -added daily Kdur 40 meq tablets  -recheck in AM    AKI on CKD stage IIIb -Held Lasix for brief  holiday, resumed 3/14  home dosing   DVT prophylaxis: rivaroxaban Code Status: Full  Family Communication: wife at bedside  Disposition: anticipate home tomorrow after IV ceftriaxone    Consultants:  GI  Procedures:  Paracentesis 3/12 Antimicrobials:  Ceftriaxone 3/12>>3/15 Zosyn 3/15>>   Subjective: Pt reports less pain at the umbilicus today. Pt is a bit more confused today.   Objective: Vitals:   08/14/23 0432 08/14/23 1918 08/15/23 0408 08/15/23 1407  BP: (!) 106/53 (!) 119/46 (!) 144/52 (!) 131/49  Pulse: (!) 47 (!) 51 (!) 58 (!) 49  Resp: 17 14 16 18   Temp: 98.1 F (36.7 C) 98.6 F (  37 C) 98.2 F (36.8 C) 98.8 F (37.1 C)  TempSrc:  Oral Oral Oral  SpO2: 96% 96% 97% 99%  Weight:      Height:        Intake/Output Summary (Last 24 hours) at 08/15/2023 1636 Last data filed at  08/15/2023 0759 Gross per 24 hour  Intake 480 ml  Output 100 ml  Net 380 ml   Filed Weights   08/12/23 1438  Weight: 81.6 kg   Examination:  General exam: Appears calm and comfortable  Respiratory system: Clear to auscultation. Respiratory effort normal. Cardiovascular system: normal S1 & S2 heard. No JVD, murmurs, rubs, gallops or clicks. No pedal edema. Gastrointestinal system: Abdomen is mildly distended, soft and nontender. No organomegaly or masses felt. Normal bowel sounds heard. Umbilical hernia with surrounding erythema and induration.  Central nervous system: Alert and oriented. No focal neurological deficits. Extremities: Symmetric 5 x 5 power. Skin: No rashes, lesions or ulcers. Psychiatry: Judgement and insight appear normal. Mood & affect appropriate.   Data Reviewed: I have personally reviewed following labs and imaging studies  CBC: Recent Labs  Lab 08/12/23 1455 08/13/23 0434  WBC 7.0 6.4  HGB 9.9* 8.7*  HCT 31.7* 27.8*  MCV 93.8 92.4  PLT 254 210    Basic Metabolic Panel: Recent Labs  Lab 08/12/23 1455 08/13/23 0434 08/14/23 0443 08/15/23 0338  NA 138 139 138 135  K 2.9* 2.9* 3.4* 3.1*  CL 99 105 105 104  CO2 27 25 24 22   GLUCOSE 142* 100* 92 99  BUN 33* 32* 27* 24*  CREATININE 2.28* 2.06* 1.92* 2.09*  CALCIUM 8.5* 7.8* 8.1* 8.2*  MG  --   --  2.1  --     CBG: Recent Labs  Lab 08/12/23 1549  GLUCAP 106*    Recent Results (from the past 240 hours)  Culture, body fluid w Gram Stain-bottle     Status: None (Preliminary result)   Collection Time: 08/12/23 10:45 AM   Specimen: Ascitic  Result Value Ref Range Status   Specimen Description ASCITIC  Final   Special Requests BOTTLES DRAWN AEROBIC AND ANAEROBIC 10 CC  Final   Culture   Final    NO GROWTH 3 DAYS Performed at [facility], [address], [place], [place] [address]    Report Status PENDING  Incomplete  Gram stain     Status: None   Collection Time: 08/12/23 10:45 AM    Specimen: Ascitic  Result Value Ref Range Status   Specimen Description ASCITIC  Final   Special Requests NONE  Final   Gram Stain   Final    ASCITIC CYTOSPIN SMEAR WBC PRESENT,BOTH PMN AND MONONUCLEAR NO ORGANISMS SEEN Performed at [facility], [address], [place], [place] [address]    Report Status 08/12/2023 FINAL  Final  Blood culture (routine x 2)     Status: None (Preliminary result)   Collection Time: 08/12/23  3:53 PM   Specimen: BLOOD  Result Value Ref Range Status   Specimen Description BLOOD BLOOD RIGHT FOREARM  Final   Special Requests   Final    BOTTLES DRAWN AEROBIC AND ANAEROBIC Blood Culture results may not be optimal due to an inadequate volume of blood received in culture bottles   Culture   Final    NO GROWTH 3 DAYS Performed at [facility], [address], [place], [place] [address]    Report Status PENDING  Incomplete  Blood culture (routine x 2)  Status: None (Preliminary result)   Collection Time: 08/12/23  3:53 PM   Specimen: BLOOD  Result Value Ref Range Status   Specimen Description BLOOD LEFT ANTECUBITAL  Final   Special Requests   Final    BOTTLES DRAWN AEROBIC AND ANAEROBIC Blood Culture results may not be optimal due to an inadequate volume of blood received in culture bottles   Culture   Final    NO GROWTH 3 DAYS Performed at [facility], [address] [place], [place] [address]    Report Status PENDING  Incomplete     Radiology Studies: CT ABDOMEN PELVIS W CONTRAST Result Date: 08/13/2023 CLINICAL DATA:  Abdominal pain, acute, nonlocalized umbilical pain with erythema EXAM: CT ABDOMEN AND PELVIS WITH CONTRAST TECHNIQUE: Multidetector CT imaging of the abdomen and pelvis was performed using the standard protocol following bolus administration of intravenous contrast. RADIATION DOSE REDUCTION: This exam was performed according to the departmental dose-optimization program which includes automated exposure control, adjustment of the mA  and/or kV according to patient size and/or use of iterative reconstruction technique. CONTRAST:  80mL OMNIPAQUE IOHEXOL 300 MG/ML  SOLN COMPARISON:  06/26/2023 FINDINGS: Lower chest: Small pleural effusions, right greater than left, grossly stable. Dependent atelectasis in the lung bases left greater than right as before. Sternotomy wires. Hepatobiliary: Patent TIPS. No focal liver lesion or biliary ductal dilatation. Gallbladder incompletely distended, without calcified gallstone. Pancreas: Unremarkable. No pancreatic ductal dilatation or surrounding inflammatory changes. Spleen: Normal in size without focal abnormality. Adrenals/Urinary Tract: No adrenal mass. Marked right renal parenchymal atrophy as before. 3 mm calculus, peripherally in the lower pole left renal collecting system. No hydronephrosis or ureterectasis. Multiple cortical lesions in both kidneys, some of which can be characterized as cysts, largest 3.3 cm 6 HU left upper pole; no followup recommended. Urinary bladder is decompressed. Stomach/Bowel: Stomach is partially distended, without acute finding. Small bowel is nondilated. Appendix not identified. The colon is partially distended by gas and fluid. Scattered diverticula without adjacent inflammatory change. Vascular/Lymphatic: Moderate calcified aortoiliac plaque without aneurysm. Patent interval TIPS placement from the main portal vein through the middle hepatic vein. Native portal vein and SMV remain patent. Splenic vein patent. No abdominal or pelvic adenopathy. Reproductive: Prostate is unremarkable. Other: Scattered small to moderate pelvic, perisplenic and perihepatic ascites. Fluid distends umbilical hernia. No free air. Musculoskeletal: Stable right hip arthroplasty. Mild left hip DJD. Multi level lumbar spondylitic change. Slight progression of subacute L1 vertebral compression deformity. Sternotomy wires. IMPRESSION: 1. No acute findings. 2. Interval TIPS, remains patent. 3.  Small-moderate ascites, decreased from previous. 4. Stable small pleural effusions. 5. Nonobstructive left nephrolithiasis. 6.  Aortic Atherosclerosis (ICD10-I70.0). Electronically Signed   By: Corlis Leak [name]   On: 08/13/2023 21:17    Scheduled Meds:  amiodarone  200 mg Oral Daily   atorvastatin  80 mg Oral QPM   cholecalciferol  2,000 Units Oral Daily   cyanocobalamin  1,000 mcg Oral Once per day on Tuesday Friday   doxazosin  4 mg Oral q AM   empagliflozin  10 mg Oral QAC breakfast   feeding supplement  1 Container Oral Q1500   finasteride  5 mg Oral QPM   furosemide  80 mg Oral BID   lactulose  45 g Oral TID   pantoprazole  40 mg Oral QAC breakfast   potassium chloride  40 mEq Oral Daily   Rivaroxaban  15 mg Oral Q supper   sertraline  25 mg Oral QHS   spironolactone  25 mg Oral Daily   Continuous Infusions:  albumin human     cefTRIAXone (ROCEPHIN)  IV 2 g (08/15/23 0905)    LOS: 2 days   Time spent: 55 mins  Yetzali Weld Laural Benes, MD How to contact the [facility] Attending or Consulting provider 7A - 7P or covering provider during after hours 7P -7A, for this patient?  Check the care team in [facility] and look for a) attending/consulting [facility] provider listed and b) the [facility] team listed Log into [email or web address] to find provider on call.  Locate the [facility] provider you are looking for under [facility] and page to a number that you can be directly reached. If you still have difficulty reaching the provider, please page the [facility] (Director on Call) for the Hospitalists listed on amion for assistance.  08/15/2023, 4:36 PM

## 2023-08-15 NOTE — Progress Notes (Addendum)
 Pharmacy Antibiotic Note  Caleb Wolf is a 83 y.o. male admitted on 08/12/2023 with  intra-abdominal infection and cellulitis .  Pharmacy has been consulted for Zosyn and vancomycin dosing.  Plan: Give vancomycin 1750 mg IV x1 followed by 1250 mg IV Q36H. Goal AUC 400-550. Expected AUC: 517.5 Expected Css min: 13.2 SCr used: 2.09  Weight used: IBW, Vd used: 0.72 (BMI 26.58) Start Zosyn 3.375 g IV Q8H Continue to monitor renal function and follow culture results   Height: 5\' 9"  (175.3 cm) Weight: 81.6 kg (180 lb) IBW/kg (Calculated) : 70.7  Temp (24hrs), Avg:98.5 F (36.9 C), Min:98.2 F (36.8 C), Max:98.8 F (37.1 C)  Recent Labs  Lab 08/12/23 1455 08/12/23 1555 08/12/23 1752 08/13/23 0434 08/14/23 0443 08/15/23 0338  WBC 7.0  --   --  6.4  --   --   CREATININE 2.28*  --   --  2.06* 1.92* 2.09*  LATICACIDVEN  --  2.9* 2.1*  --   --   --     Estimated Creatinine Clearance: 27.7 mL/min (A) (by C-G formula based on SCr of 2.09 mg/dL (H)).    Allergies  Allergen Reactions   Codeine Sulfate Nausea Only    Antimicrobials this admission: 3/12-3/15 Ceftriaxone x 1 3/15 Zosyn >>  3/15 Vanc>>  Microbiology results: 3/12 BCx: IP 3/12 Ascitic fluid: IP  Thank you for allowing pharmacy to be a part of this patient's care.  Merryl Hacker, PharmD Clinical Pharmacist  08/15/2023 5:29 PM

## 2023-08-15 NOTE — Plan of Care (Signed)
  Problem: Clinical Measurements: Goal: Will remain free from infection Outcome: Progressing   Problem: Clinical Measurements: Goal: Diagnostic test results will improve Outcome: Progressing   Problem: Activity: Goal: Risk for activity intolerance will decrease Outcome: Progressing   

## 2023-08-16 DIAGNOSIS — K429 Umbilical hernia without obstruction or gangrene: Secondary | ICD-10-CM | POA: Diagnosis not present

## 2023-08-16 DIAGNOSIS — K652 Spontaneous bacterial peritonitis: Secondary | ICD-10-CM | POA: Diagnosis not present

## 2023-08-16 DIAGNOSIS — L03316 Cellulitis of umbilicus: Secondary | ICD-10-CM | POA: Diagnosis not present

## 2023-08-16 DIAGNOSIS — R188 Other ascites: Secondary | ICD-10-CM | POA: Diagnosis not present

## 2023-08-16 LAB — CBC
HCT: 28 % — ABNORMAL LOW (ref 39.0–52.0)
Hemoglobin: 8.6 g/dL — ABNORMAL LOW (ref 13.0–17.0)
MCH: 28.7 pg (ref 26.0–34.0)
MCHC: 30.7 g/dL (ref 30.0–36.0)
MCV: 93.3 fL (ref 80.0–100.0)
Platelets: 210 10*3/uL (ref 150–400)
RBC: 3 MIL/uL — ABNORMAL LOW (ref 4.22–5.81)
RDW: 14.7 % (ref 11.5–15.5)
WBC: 6.8 10*3/uL (ref 4.0–10.5)
nRBC: 0 % (ref 0.0–0.2)

## 2023-08-16 LAB — COMPREHENSIVE METABOLIC PANEL
ALT: 57 U/L — ABNORMAL HIGH (ref 0–44)
AST: 99 U/L — ABNORMAL HIGH (ref 15–41)
Albumin: 3.1 g/dL — ABNORMAL LOW (ref 3.5–5.0)
Alkaline Phosphatase: 59 U/L (ref 38–126)
Anion gap: 10 (ref 5–15)
BUN: 23 mg/dL (ref 8–23)
CO2: 23 mmol/L (ref 22–32)
Calcium: 8.3 mg/dL — ABNORMAL LOW (ref 8.9–10.3)
Chloride: 105 mmol/L (ref 98–111)
Creatinine, Ser: 2.08 mg/dL — ABNORMAL HIGH (ref 0.61–1.24)
GFR, Estimated: 31 mL/min — ABNORMAL LOW (ref >60)
Glucose, Bld: 108 mg/dL — ABNORMAL HIGH (ref 70–99)
Potassium: 3.6 mmol/L (ref 3.5–5.1)
Sodium: 138 mmol/L (ref 135–145)
Total Bilirubin: 0.4 mg/dL (ref 0.0–1.2)
Total Protein: 5.3 g/dL — ABNORMAL LOW (ref 6.5–8.1)

## 2023-08-16 LAB — AMMONIA: Ammonia: 29 umol/L (ref 9–35)

## 2023-08-16 NOTE — Consult Note (Addendum)
 [facility] Consult  Reason for Consult: Umbilical cellulitis, concern for fluid collection Referring Physician: Dr. Laural Benes  Chief Complaint   Weakness     HPI: Caleb Wolf is a 83 y.o. male who was admitted with SBP.  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  He was noted to have an umbilical hernia upon admission, so CT of the abdomen and pelvis was obtained.  This demonstrated a fluid filled umbilical hernia.  He had erythema and tenderness over his umbilical hernia, which progressively worsened throughout his admission.  Dr. Jena Gauss spoke with a radiologist regarding his CT scan, and there was concern for possible noncommunicating subcutaneous fluid collection associated with the hernia.  His antibiotics were broadened to Zosyn and vancomycin. Patient denies any history of abdominal surgeries.  Today, he has slightly improved pain associated with his umbilicus, and erythema and induration is significantly improved.  He is passing multiple loose bowel movements without issue.  Past Medical History:  Diagnosis Date   AAA (abdominal aortic aneurysm) (HCC)    Arthritis    Basal cell carcinoma    BOOP (bronchiolitis obliterans with organizing pneumonia) (HCC) 2023   Carotid stenosis    Chronic kidney disease, stage 3b (HCC) 12/13/2021   Cirrhosis of liver not due to alcohol (HCC)    Coronary artery disease    Multvessel s/p CABG 2015   Enlarged prostate    Essential hypertension    GERD (gastroesophageal reflux disease)    Gout    History of colon polyps    History of hiatal hernia    History of kidney stones    Hyperlipidemia    Leukocytosis 01/21/2023   Lumbar disc disease    Myocardial infarction (HCC) 2015   Oxygen dependent    at bedtime- 2L   Persistent atrial fibrillation (HCC)    8 cardioversions and 2 ablations    Sleep apnea    CPAP   Spinal stenosis     Past Surgical History:  Procedure Laterality Date   ABLATION OF DYSRHYTHMIC FOCUS  07/28/2017   ATRIAL FIBRILLATION ABLATION N/A 07/28/2017   Procedure: ATRIAL FIBRILLATION ABLATION;  Surgeon: Hillis Range, MD;  Location: [facility] INVASIVE CV LAB;  Service: Cardiovascular;  Laterality: N/A;   ATRIAL FIBRILLATION ABLATION N/A 12/04/2020   Procedure: ATRIAL FIBRILLATION ABLATION;  Surgeon: Hillis Range, MD;  Location: [facility] INVASIVE CV LAB;  Service: Cardiovascular;  Laterality: N/A;   BACK SURGERY     CARDIAC CATHETERIZATION  05/22/2014   Procedure: IABP INSERTION;  Surgeon: Marykay Lex, MD;  Location: [facility] CATH LAB;  Service: Cardiovascular;;   CARDIOVERSION N/A 04/29/2017   Procedure: CARDIOVERSION;  Surgeon: Jonelle Sidle, MD;  Location: [facility] ENDO SUITE;  Service: Cardiovascular;  Laterality: N/A;   CARDIOVERSION N/A 08/13/2017   Procedure: CARDIOVERSION;  Surgeon: Chrystie Nose, MD;  Location: [facility] ENDOSCOPY;  Service: Cardiovascular;  Laterality: N/A;   CARDIOVERSION N/A 08/15/2019   Procedure: CARDIOVERSION;  Surgeon: Lars Masson, MD;  Location: [facility] ENDOSCOPY;  Service: Cardiovascular;  Laterality: N/A;   CARDIOVERSION N/A 03/09/2020   Procedure: CARDIOVERSION;  Surgeon: Jodelle Red, MD;  Location: [facility] ENDOSCOPY;  Service: Cardiovascular;  Laterality: N/A;   CARDIOVERSION N/A 07/06/2020   Procedure: CARDIOVERSION;  Surgeon: Thurmon Fair, MD;  Location: [facility] ENDOSCOPY;  Service: Cardiovascular;  Laterality: N/A;   CARDIOVERSION N/A 01/18/2021   Procedure: CARDIOVERSION;  Surgeon: Duke Salvia,  Elmarie Shiley, MD;  Location: [facility] ENDOSCOPY;  Service: Cardiovascular;  Laterality: N/A;   CARDIOVERSION N/A 06/17/2021   Procedure: CARDIOVERSION;  Surgeon: Sande Rives, MD;  Location: [facility] ENDOSCOPY;  Service: Cardiovascular;  Laterality: N/A;   Cataract surgery Bilateral    COLONOSCOPY     COLONOSCOPY N/A 12/30/2017   Procedure:  COLONOSCOPY;  Surgeon: Malissa Hippo, MD;  [facility]: [facility] END[facility];  Service: Endoscopy;  Laterality: N/A;   CORONARY ARTERY BYPASS GRAFT N/A 05/22/2014   Procedure: CORONARY ARTERY BYPASS GRAFTING (CABG) times three using left internal mammary and right saphenous vein.;  Surgeon: Loreli Slot, MD;  Location: [facility] OR;  Service: Open Heart Surgery;  Laterality: N/A;   ENDARTERECTOMY Left 02/17/2014   Procedure: ENDARTERECTOMY CAROTID WITH PATCH ANGIOPLASTY;  Surgeon: Pryor Ochoa, MD;  Location: [facility] OR;  Service: Vascular;  Laterality: Left;   ESOPHAGEAL DILATION N/A 08/22/2015   Procedure: ESOPHAGEAL DILATION;  Surgeon: Malissa Hippo, MD;  [facility]: [facility];  Service: Endoscopy;  Laterality: N/A;   ESOPHAGOGASTRODUODENOSCOPY N/A 08/22/2015   Procedure: ESOPHAGOGASTRODUODENOSCOPY (EGD);  Surgeon: Malissa Hippo, MD;  [facility]: [facility];  Service: Endoscopy;  Laterality: N/A;  12:45 - moved to 1:55 - Ann notified pt   ESOPHAGOGASTRODUODENOSCOPY N/A 12/30/2017   Procedure: ESOPHAGOGASTRODUODENOSCOPY (EGD);  Surgeon: Malissa Hippo, MD;  [facility]: [facility];  Service: Endoscopy;  Laterality: N/A;  200   EYE SURGERY     cataract extraction (right) with repair macular tear , with IOL     right   FRACTURE SURGERY     bilateral wrist fractures- one ORIF   IR RADIOLOGIST EVAL & MGMT  12/01/2022   IR RADIOLOGIST EVAL & MGMT  06/25/2023   IR TIPS  07/13/2023   IR US GUIDE VASC ACCESS RIGHT  07/13/2023   JOINT REPLACEMENT  2011   left knee   LEFT HEART CATHETERIZATION WITH CORONARY ANGIOGRAM N/A 05/22/2014   Procedure: LEFT HEART CATHETERIZATION WITH CORONARY ANGIOGRAM;  Surgeon: Marykay Lex, MD;  Location: [facility] CATH LAB;  Service: Cardiovascular;  Laterality: N/A;   LITHOTRIPSY     PARS PLANA VITRECTOMY W/ REPAIR OF MACULAR HOLE     RHINOPLASTY     TEE WITHOUT CARDIOVERSION N/A 04/29/2017   Procedure: TRANSESOPHAGEAL ECHOCARDIOGRAM (TEE) WITH PROPOFOL;  Surgeon:  Jonelle Sidle, MD;  Location: [facility];  Service: Cardiovascular;  Laterality: N/A;   TIPS PROCEDURE N/A 07/13/2023   Procedure: TRANS-JUGULAR INTRAHEPATIC PORTAL SHUNT (TIPS);  Surgeon: Roanna Banning, MD;  Location: [facility] OR;  Service: Radiology;  Laterality: N/A;   TONSILLECTOMY     TOTAL HIP ARTHROPLASTY  12/08/2011   Procedure: TOTAL HIP ARTHROPLASTY;  Surgeon: Loanne Drilling, MD;  Location: [facility] ORS;  Service: Orthopedics;  Laterality: Right;   UPPER GI ENDOSCOPY  12/18/2015   Procedure: UPPER GI ENDOSCOPY;  Surgeon: Axel Filler, MD;  Location: [facility] ORS;  Service: General;;   WRIST SURGERY Right 49yrs ago   WRIST SURGERY Left     Family History  Problem Relation Age of Onset   Allergies Mother    Heart disease Mother    Hypertension Mother    Heart disease Father        MI    Social History   Tobacco Use   Smoking status: Former    Current packs/day: 0.00    Average packs/day: 1.5 packs/day for 25.0 years (37.5 ttl pk-yrs)    Types: Cigarettes    Start date: 08/08/1960  Quit date: 06/02/1982    Years since quitting: 41.2    Passive exposure: Never   Smokeless tobacco: Never   Tobacco comments:    quit smoking 30+yrs ago  Vaping Use   Vaping status: Never Used  Substance Use Topics   Alcohol use: No    Alcohol/week: 0.0 standard drinks of alcohol    Comment: occasionally    Drug use: No    Medications: I have reviewed the patient's current medications.  Allergies  Allergen Reactions   Codeine Sulfate Nausea Only     ROS:  Pertinent items are noted in HPI.  Blood pressure (!) 125/50, pulse (!) 52, temperature 98.5 F (36.9 C), temperature source Oral, resp. rate 14, height 5\' 9"  (1.753 m), weight 81.6 kg, SpO2 (!) 89%. Physical Exam Vitals reviewed.  Constitutional:      Appearance: Normal appearance.  HENT:     Head: Normocephalic and atraumatic.  Eyes:     Extraocular Movements: Extraocular movements intact.     Pupils: Pupils are equal,  round, and reactive to light.  Cardiovascular:     Rate and Rhythm: Normal rate.  Pulmonary:     Effort: Pulmonary effort is normal.  Abdominal:     Comments: Abdomen soft, moderate distention, no percussion tenderness, tenderness to palpation around umbilicus; no rigidity, guarding, rebound tenderness; umbilical hernia present, area is soft, though tender to palpation, overlying erythema without evidence of wound, minimal induration surrounding umbilicus  Musculoskeletal:        General: Normal range of motion.     Cervical back: Normal range of motion.  Skin:    General: Skin is warm and dry.  Neurological:     General: No focal deficit present.     Mental Status: He is alert. Mental status is at baseline.  Psychiatric:        Mood and Affect: Mood normal.        Behavior: Behavior normal.     Results: Results for orders placed or performed during the hospital encounter of 08/12/23 (from the past 48 hours)  Comprehensive metabolic panel     Status: Abnormal   Collection Time: 08/15/23  3:38 AM  Result Value Ref Range   Sodium 135 135 - 145 mmol/L   Potassium 3.1 (L) 3.5 - 5.1 mmol/L   Chloride 104 98 - 111 mmol/L   CO2 22 22 - 32 mmol/L   Glucose, Bld 99 70 - 99 mg/dL    Comment: Glucose reference range applies only to samples taken after fasting for at least 8 hours.   BUN 24 (H) 8 - 23 mg/dL   Creatinine, Ser 4.09 (H) 0.61 - 1.24 mg/dL   Calcium 8.2 (L) 8.9 - 10.3 mg/dL   Total Protein 5.3 (L) 6.5 - 8.1 g/dL   Albumin 2.9 (L) 3.5 - 5.0 g/dL   AST 82 (H) 15 - 41 U/L   ALT 45 (H) 0 - 44 U/L   Alkaline Phosphatase 59 38 - 126 U/L   Total Bilirubin 0.4 0.0 - 1.2 mg/dL   GFR, Estimated 31 (L) >60 mL/min    Comment: (NOTE) Calculated using the CKD-EPI Creatinine Equation (2021)    Anion gap 9 5 - 15    Comment: Performed at [facility], [address] [place], [place] [address]  Protime-INR     Status: Abnormal   Collection Time: 08/15/23  3:38 AM  Result Value Ref  Range   Prothrombin Time 31.8 (H) 11.4 - 15.2 seconds  INR 3.0 (H) 0.8 - 1.2    Comment: (NOTE) INR goal varies based on device and disease states. Performed at [facility], [address], [place], [place] [address]   CBC     Status: Abnormal   Collection Time: 08/15/23  3:38 AM  Result Value Ref Range   WBC 6.7 4.0 - 10.5 K/uL   RBC 3.00 (L) 4.22 - 5.81 MIL/uL   Hemoglobin 8.6 (L) 13.0 - 17.0 g/dL   HCT 91.4 (L) 78.2 - 95.6 %   MCV 96.3 80.0 - 100.0 fL   MCH 28.7 26.0 - 34.0 pg   MCHC 29.8 (L) 30.0 - 36.0 g/dL   RDW 21.3 08.6 - 57.8 %   Platelets 228 150 - 400 K/uL   nRBC 0.0 0.0 - 0.2 %    Comment: Performed at [facility], [address] [place], [place] [address]  Comprehensive metabolic panel     Status: Abnormal   Collection Time: 08/16/23  4:33 AM  Result Value Ref Range   Sodium 138 135 - 145 mmol/L   Potassium 3.6 3.5 - 5.1 mmol/L   Chloride 105 98 - 111 mmol/L   CO2 23 22 - 32 mmol/L   Glucose, Bld 108 (H) 70 - 99 mg/dL    Comment: Glucose reference range applies only to samples taken after fasting for at least 8 hours.   BUN 23 8 - 23 mg/dL   Creatinine, Ser 9.52 (H) 0.61 - 1.24 mg/dL   Calcium 8.3 (L) 8.9 - 10.3 mg/dL   Total Protein 5.3 (L) 6.5 - 8.1 g/dL   Albumin 3.1 (L) 3.5 - 5.0 g/dL   AST 99 (H) 15 - 41 U/L   ALT 57 (H) 0 - 44 U/L   Alkaline Phosphatase 59 38 - 126 U/L   Total Bilirubin 0.4 0.0 - 1.2 mg/dL   GFR, Estimated 31 (L) >60 mL/min    Comment: (NOTE) Calculated using the CKD-EPI Creatinine Equation (2021)    Anion gap 10 5 - 15    Comment: Performed at [facility], [address], [place], [place] [address]  Ammonia     Status: None   Collection Time: 08/16/23  4:33 AM  Result Value Ref Range   Ammonia 29 9 - 35 umol/L    Comment: Performed at [facility], [address], [place], [place] [address]  CBC     Status: Abnormal   Collection Time: 08/16/23  4:33 AM  Result Value Ref Range   WBC 6.8 4.0 - 10.5 K/uL   RBC 3.00 (L) 4.22 -  5.81 MIL/uL   Hemoglobin 8.6 (L) 13.0 - 17.0 g/dL   HCT 27.2 (L) 53.6 - 64.4 %   MCV 93.3 80.0 - 100.0 fL   MCH 28.7 26.0 - 34.0 pg   MCHC 30.7 30.0 - 36.0 g/dL   RDW 03.4 74.2 - 59.5 %   Platelets 210 150 - 400 K/uL   nRBC 0.0 0.0 - 0.2 %    Comment: Performed at [facility], [address], [place], [place] [address]    No results found.   Assessment & Plan:  Caleb Wolf is a 83 y.o. male who was admitted with SBP.  General surgery was consulted for periumbilical fluid collection versus umbilical hernia.  Imaging and blood work evaluated by myself.  -Upon my evaluation of the patient's CT imaging, he has a fluid-filled umbilicus, likely from his ascites, no evidence of bowel within the hernia.  There may be  a secondary fluid collection present -I had a long discussion with the patient and his wife.  I explained that he has an umbilical hernia which is currently fluid-filled, likely secondary to his liver disease with ascites.  We discussed that in patients with moderate to severe liver disease, the recommendation is to not repair an umbilical hernia unless there is concern for incarceration of bowel or some other disease process that requires an emergent surgery.  If we were to attempt to repair the hernia or drain any fluid at this site, he will have a chronic nonhealing wound because he would continually have ascites leaking from the surgical site. -Patient is not currently septic based on blood work and vitals, and he has had clinical improvement with changing antibiotics.  Recommend conservative treatment with antibiotics for overlying cellulitis -I discussed with the patient, his wife, and Dr. Laural Benes that if this area clinically worsens or he becomes more septic, I would recommend IR evaluation for possible drainage of associated fluid collections.  I did further explain that this fluid likely communicates with the ascites within his abdomen and therefore draining it would be a very  temporary measure, as the area would refill with more ascitic fluid -Finally, I did explain to the patient and his wife that if he begins to have severe nausea, vomiting, obstipation, and worsening pain or swelling around his umbilicus, he needs to present to the hospital for evaluation of his umbilical hernia -Okay for diet from surgical standpoint -No acute surgical intervention -Appreciate GI recommendations regarding his liver disease -Care per hospitalist -General Surgery to sign off.  Please call with any questions or concerns  All questions were answered to the satisfaction of the patient and family.  Note: Portions of this report may have been transcribed using voice recognition software. Every effort has been made to ensure accuracy; however, inadvertent computerized transcription errors may still be present.   -- Theophilus Kinds, DO [facility] [address] [place], [place] [address] [phone] (office)

## 2023-08-16 NOTE — Progress Notes (Signed)
 Patient rested well last night.  Less periumbilical pain.  Ceftriaxone wrapped up yesterday; Zosyn and vancomycin added for periumbilical cellulitis.  Vital signs in last 24 hours: Temp:  [97.9 F (36.6 C)-98.8 F (37.1 C)] 98.5 F (36.9 C) (03/16 0422) Pulse Rate:  [49-56] 52 (03/16 0422) Resp:  [14-18] 14 (03/16 0422) BP: (125-131)/(49-60) 125/50 (03/16 0422) SpO2:  [89 %-99 %] 89 % (03/16 0422) Last BM Date : 08/16/23 General:   Alert, conversant well oriented.  No acute distress.  Accompanied by his spouse. Abdomen: Full but not distended - periumbilical area much less erythema.  Soft and moderately tender -much less than observed yesterday.  extremities:  Without clubbing or edema.    Intake/Output from previous day: 03/15 0701 - 03/16 0700 In: 480 [P.O.:480] Out: 450 [Urine:450] Intake/Output this shift: Total I/O In: 240 [P.O.:240] Out: -   Lab Results: Recent Labs    08/15/23 0338 08/16/23 0433  WBC 6.7 6.8  HGB 8.6* 8.6*  HCT 28.9* 28.0*  PLT 228 210   BMET Recent Labs    08/14/23 0443 08/15/23 0338 08/16/23 0433  NA 138 135 138  K 3.4* 3.1* 3.6  CL 105 104 105  CO2 24 22 23   GLUCOSE 92 99 108*  BUN 27* 24* 23  CREATININE 1.92* 2.09* 2.08*  CALCIUM 8.1* 8.2* 8.3*   LFT Recent Labs    08/16/23 0433  PROT 5.3*  ALBUMIN 3.1*  AST 99*  ALT 57*  ALKPHOS 59  BILITOT 0.4   PT/INR Recent Labs    08/14/23 0443 08/15/23 0338  LABPROT 33.4* 31.8*  INR 3.3* 3.0*    Impression: 83 year old gentleman with decompensated cirrhosis  /recurrent SBP status post TIPS recently.  Clinically has responded to 5 days of ceftriaxone.  Periumbilical cellulitis seems to be much improved over the past 24 hours with modification in his antibiotic regimen. Fortunately, it seems no surgical intervention is needed at this time.  Cellulitis likely in evolution for over past several weeks..  I wonder if this is entity has contributed to recurrent SBP.  Chronic  kidney disease-creatinine stable on furosemide and spironolactone.  Recommendations:  Agree with continuation of Zosyn vancomycin per primary care team  Ultimately, he should go on Bactrim DS every day indefinitely for prophylaxis against recurrent SBP  Continue daily PPI, lactulose.  Watch serum creatinine closely as an outpatient while on Aldactone and Lasix  Keep follow-up appointment  with IR for TIPS check later this month

## 2023-08-16 NOTE — Progress Notes (Signed)
 PROGRESS NOTE  Caleb Wolf  YQM:[id number] DOB: Sep 17, 1940 DOA: 08/12/2023 PCP: Carylon Perches, MD   Chief Complaint  Patient presents with   Weakness   Level of care: Med-Surg  Brief Admission History:   83 y.o. 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 -Patient is treated with recurrent paracentesis for reaccumulating ascites, most recently this morning, it has been drawn of the abdomen, was cloudy, and results came back with significantly elevated white blood cells, so he was called back and told to come to emergency room room for possible infection, patient reports generalized weakness, fatigue, reports he is compliant with his Lasix, Aldactone and lactulose, reports mild abdominal discomfort, but denies fever, chills. -Paracentesis performed earlier today showing 1790 white blood cells, Gram stain is negative, labs significant for low potassium at 2.9, creatinine at 2.28 up from 1.9 baseline, but cell count within normal limit, lactic acid elevated at 2.9, patient received IV Rocephin after blood cultures were sent, and [facility] hospitalist consulted to admit.   Assessment and Plan:  Recurrent ascites Recurrent SBP -Patient with known history of liver cirrhosis, history of recurrent SBP in the past. 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 -paracentesis 3/12 with 1794 nucleated cells. -Follow-up blood cultures, follow on final peritoneal fluid cultures, so far negative Gram stain. -Continue with IV Rocephin 2 g daily x 5 days - last dose to be given on 3/16.  -Patient appears to be having recurrent SBP, although his previous paracentesis with elevated nucleated cells, will consult GI to see if SBP prophylaxis Cipro and Bactrim is appropriate here He is a little bit more confused today, add on an ammonia level -ID recommended resuming his daily Bactrim DS for SBP prevention indefinitely    Liver  cirrhosis -[place] history of liver cirrhosis in the past, recurrent ascites and hepatic encephalopathy status post TIPS. -Continue with home medications including lactulose, Aldactone, furosemide.  Periumbilical Cellulitis -discussed with Dr. Jena Gauss, we are requesting surgery evaluation  -hold rivaroxaban for now for surgery evaluation  -reached out to Dr. Robyne Peers to request consultation and broadened antibiotics coverage zosyn per pharm D dosing requested -added CBC to labs, recheck in AM  Elevated PT/INR -INR up to 3, difficult to interpret significance of this as he is on rivaroxaban, no bleeding complications -defer vitamin K to GI service   Proximal A-fib on chronic anticoagulation -Continue with amiodarone -held rivaroxaban 3/15 for surgery evaluation -will discuss with pharm D if it is safe to restart with INR 3.0    Hyperlipidemia -Continue atorvastatin   BPH -Continue doxazosin and finasteride   GERD -Continue Protonix   Weakness -PT eval requested    Lactic acidosis -Held Lasix for brief holiday, resumed 3/14 home dosing   Hypokalemia -added daily Kdur 40 meq tablets  -recheck in AM    AKI on CKD stage IIIb -Held Lasix for brief holiday, resumed 3/14  home dosing   DVT prophylaxis: rivaroxaban Code Status: Full  Family Communication: wife at bedside  Disposition: anticipate home tomorrow if ok with GI    Consultants:  GI  Procedures:  Paracentesis 3/12  Antimicrobials:  Ceftriaxone 3/12>>3/15 Zosyn 3/15>> Vanc 3/15>>   Subjective: Less pain at the umbilicus today.   Objective: Vitals:   08/15/23 0408 08/15/23 1407 08/15/23 2026 08/16/23 0422  BP: (!) 144/52 (!) 131/49 128/60 (!) 125/50  Pulse: (!) 58 (!) 49 (!) 56 (!) 52  Resp: 16  18 18 14   Temp: 98.2 F (36.8 C) 98.8 F (37.1 C) 97.9 F (36.6 C) 98.5 F (36.9 C)  TempSrc: Oral Oral Oral Oral  SpO2: 97% 99% 97% (!) 89%  Weight:      Height:        Intake/Output Summary (Last  24 hours) at 08/16/2023 1407 Last data filed at 08/16/2023 0936 Gross per 24 hour  Intake 720 ml  Output 350 ml  Net 370 ml   Filed Weights   08/12/23 1438  Weight: 81.6 kg   Examination:  General exam: Appears calm and comfortable  Respiratory system: Clear to auscultation. Respiratory effort normal. Cardiovascular system: normal S1 & S2 heard. No JVD, murmurs, rubs, gallops or clicks. No pedal edema. Gastrointestinal system: Abdomen is mildly distended, soft and nontender. No organomegaly or masses felt. Normal bowel sounds heard. Umbilical hernia less tender, no erythema seen.  Central nervous system: Alert and oriented. No focal neurological deficits. Extremities: Symmetric 5 x 5 power. Skin: No rashes, lesions or ulcers. Psychiatry: Judgement and insight appear diminished. Mood & affect appropriate.   Data Reviewed: I have personally reviewed following labs and imaging studies  CBC: Recent Labs  Lab 08/12/23 1455 08/13/23 0434 08/15/23 0338 08/16/23 0433  WBC 7.0 6.4 6.7 6.8  HGB 9.9* 8.7* 8.6* 8.6*  HCT 31.7* 27.8* 28.9* 28.0*  MCV 93.8 92.4 96.3 93.3  PLT 254 210 228 210    Basic Metabolic Panel: Recent Labs  Lab 08/12/23 1455 08/13/23 0434 08/14/23 0443 08/15/23 0338 08/16/23 0433  NA 138 139 138 135 138  K 2.9* 2.9* 3.4* 3.1* 3.6  CL 99 105 105 104 105  CO2 27 25 24 22 23   GLUCOSE 142* 100* 92 99 108*  BUN 33* 32* 27* 24* 23  CREATININE 2.28* 2.06* 1.92* 2.09* 2.08*  CALCIUM 8.5* 7.8* 8.1* 8.2* 8.3*  MG  --   --  2.1  --   --     CBG: Recent Labs  Lab 08/12/23 1549  GLUCAP 106*    Recent Results (from the past 240 hours)  Culture, body fluid w Gram Stain-bottle     Status: None (Preliminary result)   Collection Time: 08/12/23 10:45 AM   Specimen: Ascitic  Result Value Ref Range Status   Specimen Description ASCITIC  Final   Special Requests BOTTLES DRAWN AEROBIC AND ANAEROBIC 10 CC  Final   Culture   Final    NO GROWTH 4 DAYS Performed  at [facility], [address] [place], [place] [address]    Report Status PENDING  Incomplete  Gram stain     Status: None   Collection Time: 08/12/23 10:45 AM   Specimen: Ascitic  Result Value Ref Range Status   Specimen Description ASCITIC  Final   Special Requests NONE  Final   Gram Stain   Final    ASCITIC CYTOSPIN SMEAR WBC PRESENT,BOTH PMN AND MONONUCLEAR NO ORGANISMS SEEN Performed at [facility], [address], [place], [place] [address]    Report Status 08/12/2023 FINAL  Final  Blood culture (routine x 2)     Status: None (Preliminary result)   Collection Time: 08/12/23  3:53 PM   Specimen: BLOOD  Result Value Ref Range Status   Specimen Description BLOOD BLOOD RIGHT FOREARM  Final   Special Requests   Final    BOTTLES DRAWN AEROBIC AND ANAEROBIC Blood Culture results may not be optimal due to an inadequate volume of blood received in culture bottles  Culture   Final    NO GROWTH 4 DAYS Performed at [facility], [address], [place], [place] [address]    Report Status PENDING  Incomplete  Blood culture (routine x 2)     Status: None (Preliminary result)   Collection Time: 08/12/23  3:53 PM   Specimen: BLOOD  Result Value Ref Range Status   Specimen Description BLOOD LEFT ANTECUBITAL  Final   Special Requests   Final    BOTTLES DRAWN AEROBIC AND ANAEROBIC Blood Culture results may not be optimal due to an inadequate volume of blood received in culture bottles   Culture   Final    NO GROWTH 4 DAYS Performed at [facility], [address] [place], [place] [address]    Report Status PENDING  Incomplete     Radiology Studies: No results found.   Scheduled Meds:  amiodarone  200 mg Oral Daily   atorvastatin  80 mg Oral QPM   cholecalciferol  2,000 Units Oral Daily   cyanocobalamin  1,000 mcg Oral Once per day on Tuesday Friday   doxazosin  4 mg Oral q AM   empagliflozin  10 mg Oral QAC breakfast   feeding supplement  1 Container Oral Q1500    finasteride  5 mg Oral QPM   furosemide  80 mg Oral BID   lactulose  45 g Oral TID   pantoprazole  40 mg Oral QAC breakfast   potassium chloride  40 mEq Oral BID   sertraline  25 mg Oral QHS   spironolactone  25 mg Oral Daily   Continuous Infusions:  piperacillin-tazobactam (ZOSYN)  IV 3.375 g (08/16/23 0859)   [START ON 08/17/2023] vancomycin      LOS: 3 days   Time spent: 55 mins  Murle Hellstrom Laural Benes, MD How to contact the [facility] Attending or Consulting provider 7A - 7P or covering provider during after hours 7P -7A, for this patient?  Check the care team in [facility] and look for a) attending/consulting [facility] provider listed and b) the [facility] team listed Log into [email or web address] to find provider on call.  Locate the [facility] provider you are looking for under [facility] and page to a number that you can be directly reached. If you still have difficulty reaching the provider, please page the [facility] (Director on Call) for the Hospitalists listed on amion for assistance.  08/16/2023, 2:07 PM

## 2023-08-16 NOTE — Plan of Care (Signed)
  Problem: Education: Goal: Knowledge of General Education information will improve Description: Including pain rating scale, medication(s)/side effects and non-pharmacologic comfort measures Outcome: Progressing   Problem: Clinical Measurements: Goal: Diagnostic test results will improve Outcome: Progressing   Problem: Activity: Goal: Risk for activity intolerance will decrease Outcome: Progressing   Problem: Coping: Goal: Level of anxiety will decrease Outcome: Progressing

## 2023-08-17 ENCOUNTER — Other Ambulatory Visit (HOSPITAL_COMMUNITY): Payer: Self-pay

## 2023-08-17 ENCOUNTER — Telehealth: Payer: Self-pay | Admitting: Gastroenterology

## 2023-08-17 DIAGNOSIS — K652 Spontaneous bacterial peritonitis: Secondary | ICD-10-CM | POA: Diagnosis not present

## 2023-08-17 DIAGNOSIS — L03316 Cellulitis of umbilicus: Secondary | ICD-10-CM | POA: Diagnosis not present

## 2023-08-17 DIAGNOSIS — K429 Umbilical hernia without obstruction or gangrene: Secondary | ICD-10-CM | POA: Diagnosis not present

## 2023-08-17 LAB — COMPREHENSIVE METABOLIC PANEL
ALT: 62 U/L — ABNORMAL HIGH (ref 0–44)
AST: 85 U/L — ABNORMAL HIGH (ref 15–41)
Albumin: 2.9 g/dL — ABNORMAL LOW (ref 3.5–5.0)
Alkaline Phosphatase: 64 U/L (ref 38–126)
Anion gap: 9 (ref 5–15)
BUN: 24 mg/dL — ABNORMAL HIGH (ref 8–23)
CO2: 23 mmol/L (ref 22–32)
Calcium: 8.4 mg/dL — ABNORMAL LOW (ref 8.9–10.3)
Chloride: 106 mmol/L (ref 98–111)
Creatinine, Ser: 2.27 mg/dL — ABNORMAL HIGH (ref 0.61–1.24)
GFR, Estimated: 28 mL/min — ABNORMAL LOW (ref >60)
Glucose, Bld: 103 mg/dL — ABNORMAL HIGH (ref 70–99)
Potassium: 4 mmol/L (ref 3.5–5.1)
Sodium: 138 mmol/L (ref 135–145)
Total Bilirubin: 0.4 mg/dL (ref 0.0–1.2)
Total Protein: 5.4 g/dL — ABNORMAL LOW (ref 6.5–8.1)

## 2023-08-17 LAB — CBC
HCT: 28.9 % — ABNORMAL LOW (ref 39.0–52.0)
Hemoglobin: 9 g/dL — ABNORMAL LOW (ref 13.0–17.0)
MCH: 29.2 pg (ref 26.0–34.0)
MCHC: 31.1 g/dL (ref 30.0–36.0)
MCV: 93.8 fL (ref 80.0–100.0)
Platelets: 221 10*3/uL (ref 150–400)
RBC: 3.08 MIL/uL — ABNORMAL LOW (ref 4.22–5.81)
RDW: 14.7 % (ref 11.5–15.5)
WBC: 7.6 10*3/uL (ref 4.0–10.5)
nRBC: 0 % (ref 0.0–0.2)

## 2023-08-17 LAB — CULTURE, BLOOD (ROUTINE X 2)
Culture: NO GROWTH
Culture: NO GROWTH

## 2023-08-17 LAB — CULTURE, BODY FLUID W GRAM STAIN -BOTTLE: Culture: NO GROWTH

## 2023-08-17 LAB — PROTIME-INR
INR: 1.3 — ABNORMAL HIGH (ref 0.8–1.2)
Prothrombin Time: 15.9 s — ABNORMAL HIGH (ref 11.4–15.2)

## 2023-08-17 MED ORDER — VANCOMYCIN HCL IN DEXTROSE 1-5 GM/200ML-% IV SOLN
1000.0000 mg | INTRAVENOUS | Status: DC
  Administered 2023-08-18: 1000 mg via INTRAVENOUS
  Filled 2023-08-17: qty 200

## 2023-08-17 MED ORDER — FUROSEMIDE 40 MG PO TABS
40.0000 mg | ORAL_TABLET | Freq: Every day | ORAL | Status: DC
  Administered 2023-08-17: 40 mg via ORAL
  Filled 2023-08-17 (×2): qty 1

## 2023-08-17 MED ORDER — FUROSEMIDE 40 MG PO TABS
80.0000 mg | ORAL_TABLET | Freq: Every day | ORAL | Status: DC
  Administered 2023-08-18 – 2023-08-19 (×2): 80 mg via ORAL
  Filled 2023-08-17 (×2): qty 2

## 2023-08-17 MED ORDER — RIVAROXABAN 15 MG PO TABS
15.0000 mg | ORAL_TABLET | Freq: Every day | ORAL | Status: DC
  Administered 2023-08-17 – 2023-08-18 (×2): 15 mg via ORAL
  Filled 2023-08-17 (×2): qty 1

## 2023-08-17 NOTE — Telephone Encounter (Signed)
 Darl Pikes: please arrange outpatient hospital follow-up with [facility]. Thanks!

## 2023-08-17 NOTE — Plan of Care (Signed)

## 2023-08-17 NOTE — Progress Notes (Addendum)
 Gastroenterology Progress Note    Primary Gastroenterologist:  Dr.Castaneda  Patient ID: Caleb Wolf; [id number]; August 27, 1940    Subjective   Abdominal pain improved. Notes moreso "tingling" of abdomen but no pain. No N/V. Appetite is fair. No overt GI bleeding. No mental status changes or confusion.    Objective   Vital signs in last 24 hours Temp:  [98.2 F (36.8 C)-98.9 F (37.2 C)] 98.7 F (37.1 C) (03/17 0543) Pulse Rate:  [48-49] 48 (03/17 0840) Resp:  [14-20] 14 (03/17 0543) BP: (106-129)/(37-71) 129/48 (03/17 0840) SpO2:  [97 %-99 %] 98 % (03/17 0840) Last BM Date : 08/16/23  Physical Exam General:   Alert and oriented, pleasant Head:  Normocephalic and atraumatic. Eyes:  No icterus, sclera clear. Conjuctiva pink.  Abdomen:  Bowel sounds present, full but non-tense abdomen, umbilicus with mild edema, mild periumbilical erythema at umbilicus, no TTP Extremities:  Without edema. Neurologic:  Alert and  oriented x4, no asterixis   Intake/Output from previous day: 03/16 0701 - 03/17 0700 In: 720 [P.O.:720] Out: 400 [Urine:400] Intake/Output this shift: No intake/output data recorded.  Lab Results  Recent Labs    08/15/23 0338 08/16/23 0433 08/17/23 0434  WBC 6.7 6.8 7.6  HGB 8.6* 8.6* 9.0*  HCT 28.9* 28.0* 28.9*  PLT 228 210 221   BMET Recent Labs    08/15/23 0338 08/16/23 0433 08/17/23 0434  NA 135 138 138  K 3.1* 3.6 4.0  CL 104 105 106  CO2 22 23 23   GLUCOSE 99 108* 103*  BUN 24* 23 24*  CREATININE 2.09* 2.08* 2.27*  CALCIUM 8.2* 8.3* 8.4*   LFT Recent Labs    08/15/23 0338 08/16/23 0433 08/17/23 0434  PROT 5.3* 5.3* 5.4*  ALBUMIN 2.9* 3.1* 2.9*  AST 82* 99* 85*  ALT 45* 57* 62*  ALKPHOS 59 59 64  BILITOT 0.4 0.4 0.4   PT/INR Recent Labs    08/15/23 0338 08/17/23 0434  LABPROT 31.8* 15.9*  INR 3.0* 1.3*   Studies/Results CT ABDOMEN PELVIS W CONTRAST Addendum Date: 08/15/2023 ADDENDUM REPORT: 08/15/2023 17:02 ADDENDUM:  Upon further review and comparison to CT scan 06/26/2023, the fluid-filled umbilical hernia has increased in volume compared to prior. The hernia sac is now bilobed/hourglass shaped with a portion within the peritoneal space and a portion superficial to the peritoneal space ( best seen on sagittal image 90/series 5). The intraperitoneal portion is increased in volume compared to prior. The wall of the hernia sac is enhancing. Concern for infection or inflammation of the enlarging the fluid-filled umbilical hernia sac. Findings conveyed to Dr Benard Rink on 08/15/2023  at17:01. Electronically Signed   By: Genevive Bi [name]   On: 08/15/2023 17:02   Result Date: 08/15/2023 CLINICAL DATA:  Abdominal pain, acute, nonlocalized umbilical pain with erythema EXAM: CT ABDOMEN AND PELVIS WITH CONTRAST TECHNIQUE: Multidetector CT imaging of the abdomen and pelvis was performed using the standard protocol following bolus administration of intravenous contrast. RADIATION DOSE REDUCTION: This exam was performed according to the departmental dose-optimization program which includes automated exposure control, adjustment of the mA and/or kV according to patient size and/or use of iterative reconstruction technique. CONTRAST:  80mL OMNIPAQUE IOHEXOL 300 MG/ML  SOLN COMPARISON:  06/26/2023 FINDINGS: Lower chest: Small pleural effusions, right greater than left, grossly stable. Dependent atelectasis in the lung bases left greater than right as before. Sternotomy wires. Hepatobiliary: Patent TIPS. No focal liver lesion or biliary ductal dilatation. Gallbladder incompletely distended, without calcified gallstone. Pancreas: Unremarkable.  No pancreatic ductal dilatation or surrounding inflammatory changes. Spleen: Normal in size without focal abnormality. Adrenals/Urinary Tract: No adrenal mass. Marked right renal parenchymal atrophy as before. 3 mm calculus, peripherally in the lower pole left renal collecting system. No hydronephrosis or  ureterectasis. Multiple cortical lesions in both kidneys, some of which can be characterized as cysts, largest 3.3 cm 6 HU left upper pole; no followup recommended. Urinary bladder is decompressed. Stomach/Bowel: Stomach is partially distended, without acute finding. Small bowel is nondilated. Appendix not identified. The colon is partially distended by gas and fluid. Scattered diverticula without adjacent inflammatory change. Vascular/Lymphatic: Moderate calcified aortoiliac plaque without aneurysm. Patent interval TIPS placement from the main portal vein through the middle hepatic vein. Native portal vein and SMV remain patent. Splenic vein patent. No abdominal or pelvic adenopathy. Reproductive: Prostate is unremarkable. Other: Scattered small to moderate pelvic, perisplenic and perihepatic ascites. Fluid distends umbilical hernia. No free air. Musculoskeletal: Stable right hip arthroplasty. Mild left hip DJD. Multi level lumbar spondylitic change. Slight progression of subacute L1 vertebral compression deformity. Sternotomy wires. IMPRESSION: 1. No acute findings. 2. Interval TIPS, remains patent. 3. Small-moderate ascites, decreased from previous. 4. Stable small pleural effusions. 5. Nonobstructive left nephrolithiasis. 6.  Aortic Atherosclerosis (ICD10-I70.0). Electronically Signed: By: Corlis Leak [name] On: 08/13/2023 21:17   US Paracentesis Result Date: 08/12/2023 INDICATION: Cirrhosis, recurrent ascites, recent tips 07/13/2023 EXAM: ULTRASOUND GUIDED PARACENTESIS MEDICATIONS: 1% lidocaine local COMPLICATIONS: None immediate. PROCEDURE: Informed written consent was obtained from the patient after a discussion of the risks, benefits and alternatives to treatment. A timeout was performed prior to the initiation of the procedure. Initial ultrasound scanning demonstrates a large amount of ascites within the left lower abdominal quadrant. The right lower abdomen was prepped and draped in the usual sterile  fashion. 1% lidocaine was used for local anesthesia. Following this, a 6 Fr Safe-T-Centesis catheter was introduced. An ultrasound image was saved for documentation purposes. The paracentesis was performed. The catheter was removed and a dressing was applied. The patient tolerated the procedure well without immediate post procedural complication. Patient received post-procedure intravenous albumin; see nursing notes for details. FINDINGS: A total of approximately 4 L of blood tinged exudative peritoneal fluid was removed. Samples were sent to the laboratory as requested by the clinical team. IMPRESSION: Successful ultrasound-guided paracentesis yielding 4.0 liters of peritoneal fluid. Electronically Signed   By: Judie Petit.  Shick [name]   On: 08/12/2023 11:26    Assessment  83 y.o. male with a history of decompensated cirrhosis, prior concerns for SBP but negative cultures, admitted now with recurrent SBP.   SBP: with elevated PMN and negative ascitic cultures thus far. Has completed course of IV Rocephin with last dose yesterday. Received second dose of IV Albumin 1g/kg on day 3 (3/15). He will need to be on long-term prophylaxis going forward. Hx of chronic GERD, no Barrett's. Would limit use of PPI only if necessary due to increased risk of SBP. Suspect periumbilical cellulitis as potential contributing factor for SBP; this is now improving with tailoring antibiotics (Zosyn and Vanc). Appreciate surgery evaluating and no surgical indication currently.   CKD: Creatinine up to 2.27 from 2.08 yesterday. Reviewing trends, he tends to stay in the upper 1s/low 2 range historically. Current diuretics including lasix 80 mg BID and spironolactone 25 mg daily. Titration of lasix in the past by cardiology. Received 80 mg lasix this morning. Will decrease evening dose by half. Will need to reassess tomorrow.    Cirrhosis: MELD 3.0  is 18. Will need outpatient follow-up with GI and keep outpatient IR appt for follow-up of  TIPS (TIPS procedure 07/13/23). CT this admission with patent TIPS. Without signs/symptoms of encephalopathy.     Plan / Recommendations  Will need outpatient Bactrim DS daily for prophylaxis against recurrent SBP Recommend PPI only if necessary in light of recurrent SBP Continue lactulose  Follow creatinine and adjust diuretics accordingly. Will decrease evening dose of lasix by half. Close monitoring of creatinine and adjust diuretics accordingly. Will arrange outpatient GI follow-up. Keep IR follow-up s/p TIPS GI to sign off. Please reach out if any concerns.     LOS: 4 days    08/17/2023, 9:59 AM  Gelene Mink, PhD, [facility]

## 2023-08-17 NOTE — Progress Notes (Signed)
 PROGRESS NOTE  Caleb Wolf  LKG:[id number] DOB: 1940/07/21 DOA: 08/12/2023 PCP: Carylon Perches, MD   Chief Complaint  Patient presents with   Weakness   Level of care: Med-Surg  Brief Admission History:   83 y.o. 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 -Patient is treated with recurrent paracentesis for reaccumulating ascites, most recently this morning, it has been drawn of the abdomen, was cloudy, and results came back with significantly elevated white blood cells, so he was called back and told to come to emergency room room for possible infection, patient reports generalized weakness, fatigue, reports he is compliant with his Lasix, Aldactone and lactulose, reports mild abdominal discomfort, but denies fever, chills. -Paracentesis performed earlier today showing 1790 white blood cells, Gram stain is negative, labs significant for low potassium at 2.9, creatinine at 2.28 up from 1.9 baseline, but cell count within normal limit, lactic acid elevated at 2.9, patient received IV Rocephin after blood cultures were sent, and [facility] hospitalist consulted to admit.   Assessment and Plan:  Recurrent ascites Recurrent SBP -Patient with known history of liver cirrhosis, history of recurrent SBP in the past. 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 -paracentesis 3/12 with 1794 nucleated cells. -Follow-up blood cultures, follow on final peritoneal fluid cultures, so far negative Gram stain. -Continue with IV Rocephin 2 g daily x 5 days - last dose to be given on 3/16.  -Patient appears to be having recurrent SBP, although his previous paracentesis with elevated nucleated cells, will consult GI to see if SBP prophylaxis Cipro and Bactrim is appropriate here He is a little bit more confused today, add on an ammonia level -ID recommended resuming his daily Bactrim DS for SBP prevention indefinitely, which he  will begin at discharge    Liver cirrhosis -[place] history of liver cirrhosis in the past, recurrent ascites and hepatic encephalopathy status post TIPS. -Continue with home medications including lactulose, Aldactone, furosemide.  Periumbilical Cellulitis - IMPROVING  -discussed with Dr. Jena Gauss, we are requesting surgery evaluation  -hold rivaroxaban for now for surgery evaluation  -reached out to Dr. Robyne Peers to request consultation and broadened antibiotics coverage zosyn per pharm D dosing requested -WBC has been stable and clinically has been improving -continue Zosyn/vanc for but plan to have his resume daily Bactrim DS prophylaxis at discharge  Elevated PT/INR -INR up to 3, difficult to interpret significance of this as he is on rivaroxaban, no bleeding complications -defer vitamin K to GI service   Proximal A-fib on chronic anticoagulation -Continue with amiodarone -held rivaroxaban 3/15 for surgery evaluation -will discuss with pharm D if it is safe to restart with INR 3.0    Hyperlipidemia -Continue atorvastatin   BPH -Continue doxazosin and finasteride   GERD -Continue Protonix   Weakness -PT eval appreciated    Lactic acidosis -Held Lasix for brief holiday, resumed 3/14 home dosing   Hypokalemia -added daily Kdur 40 meq tablets  -recheck in AM    AKI on CKD stage IIIb -Held Lasix for brief holiday, resumed 3/14  home dosing   DVT prophylaxis: rivaroxaban Code Status: Full  Family Communication: wife at bedside updated daily  Disposition: possible DC home tomorrow if ok with GI    Consultants:  GI  Procedures:  Paracentesis 3/12  Antimicrobials:  Ceftriaxone 3/12>>3/15 Zosyn 3/15>> Vanc 3/15>>   Subjective: Reports abd pain much improved at umbilicus  Objective: Vitals:   08/16/23  2209 08/17/23 0543 08/17/23 0840 08/17/23 1100  BP: 116/71 (!) 106/37 (!) 129/48 (!) 122/43  Pulse:  (!) 48 (!) 48 (!) 47  Resp: 14 14    Temp: 98.2 F  (36.8 C) 98.7 F (37.1 C)  97.8 F (36.6 C)  TempSrc: Oral Oral    SpO2: 97% 97% 98% 99%  Weight:      Height:        Intake/Output Summary (Last 24 hours) at 08/17/2023 1333 Last data filed at 08/17/2023 8295 Gross per 24 hour  Intake 240 ml  Output 100 ml  Net 140 ml   Filed Weights   08/12/23 1438  Weight: 81.6 kg   Examination:  General exam: Appears calm and comfortable  Respiratory system: Clear to auscultation. Respiratory effort normal. Cardiovascular system: normal S1 & S2 heard. No JVD, murmurs, rubs, gallops or clicks. No pedal edema. Gastrointestinal system: Abdomen is mildly distended, soft and nontender. No organomegaly or masses felt. Normal bowel sounds heard. Umbilical hernia much less tender, no erythema seen.  Central nervous system: Alert and oriented. No focal neurological deficits. Extremities: Symmetric 5 x 5 power. Skin: No rashes, lesions or ulcers. Psychiatry: Judgement and insight appear diminished. Mood & affect appropriate.   Data Reviewed: I have personally reviewed following labs and imaging studies  CBC: Recent Labs  Lab 08/12/23 1455 08/13/23 0434 08/15/23 0338 08/16/23 0433 08/17/23 0434  WBC 7.0 6.4 6.7 6.8 7.6  HGB 9.9* 8.7* 8.6* 8.6* 9.0*  HCT 31.7* 27.8* 28.9* 28.0* 28.9*  MCV 93.8 92.4 96.3 93.3 93.8  PLT 254 210 228 210 221    Basic Metabolic Panel: Recent Labs  Lab 08/13/23 0434 08/14/23 0443 08/15/23 0338 08/16/23 0433 08/17/23 0434  NA 139 138 135 138 138  K 2.9* 3.4* 3.1* 3.6 4.0  CL 105 105 104 105 106  CO2 25 24 22 23 23   GLUCOSE 100* 92 99 108* 103*  BUN 32* 27* 24* 23 24*  CREATININE 2.06* 1.92* 2.09* 2.08* 2.27*  CALCIUM 7.8* 8.1* 8.2* 8.3* 8.4*  MG  --  2.1  --   --   --     CBG: Recent Labs  Lab 08/12/23 1549  GLUCAP 106*    Recent Results (from the past 240 hours)  Culture, body fluid w Gram Stain-bottle     Status: None   Collection Time: 08/12/23 10:45 AM   Specimen: Ascitic  Result  Value Ref Range Status   Specimen Description ASCITIC  Final   Special Requests BOTTLES DRAWN AEROBIC AND ANAEROBIC 10 CC  Final   Culture   Final    NO GROWTH 5 DAYS Performed at [facility], [address] [place], [place] [address]    Report Status 08/17/2023 FINAL  Final  Gram stain     Status: None   Collection Time: 08/12/23 10:45 AM   Specimen: Ascitic  Result Value Ref Range Status   Specimen Description ASCITIC  Final   Special Requests NONE  Final   Gram Stain   Final    ASCITIC CYTOSPIN SMEAR WBC PRESENT,BOTH PMN AND MONONUCLEAR NO ORGANISMS SEEN Performed at [facility], [address] [place], [place] [address]    Report Status 08/12/2023 FINAL  Final  Blood culture (routine x 2)     Status: None   Collection Time: 08/12/23  3:53 PM   Specimen: BLOOD  Result Value Ref Range Status   Specimen Description BLOOD BLOOD RIGHT FOREARM  Final   Special Requests  Final    BOTTLES DRAWN AEROBIC AND ANAEROBIC Blood Culture results may not be optimal due to an inadequate volume of blood received in culture bottles   Culture   Final    NO GROWTH 5 DAYS Performed at [facility], [address], [place], [place] [address]    Report Status 08/17/2023 FINAL  Final  Blood culture (routine x 2)     Status: None   Collection Time: 08/12/23  3:53 PM   Specimen: BLOOD  Result Value Ref Range Status   Specimen Description BLOOD LEFT ANTECUBITAL  Final   Special Requests   Final    BOTTLES DRAWN AEROBIC AND ANAEROBIC Blood Culture results may not be optimal due to an inadequate volume of blood received in culture bottles   Culture   Final    NO GROWTH 5 DAYS Performed at [facility], [address] [place], [place] [address]    Report Status 08/17/2023 FINAL  Final     Radiology Studies: No results found.   Scheduled Meds:  amiodarone  200 mg Oral Daily   atorvastatin  80 mg Oral QPM   cholecalciferol  2,000 Units Oral Daily   cyanocobalamin  1,000 mcg Oral Once per  day on Tuesday Friday   doxazosin  4 mg Oral q AM   empagliflozin  10 mg Oral QAC breakfast   feeding supplement  1 Container Oral Q1500   finasteride  5 mg Oral QPM   furosemide  40 mg Oral Q1500   [START ON 08/18/2023] furosemide  80 mg Oral Daily   lactulose  45 g Oral TID   pantoprazole  40 mg Oral QAC breakfast   potassium chloride  40 mEq Oral BID   rivaroxaban  15 mg Oral Q supper   sertraline  25 mg Oral QHS   spironolactone  25 mg Oral Daily   Continuous Infusions:  piperacillin-tazobactam (ZOSYN)  IV 3.375 g (08/17/23 1100)   [START ON 08/18/2023] vancomycin      LOS: 4 days   Time spent: 55 mins  Mckinzey Entwistle Laural Benes, MD How to contact the [facility] Attending or Consulting provider 7A - 7P or covering provider during after hours 7P -7A, for this patient?  Check the care team in [facility] and look for a) attending/consulting TRH provider listed and b) the [facility] team listed Log into [email or web address] to find provider on call.  Locate the [facility] provider you are looking for under [facility] and page to a number that you can be directly reached. If you still have difficulty reaching the provider, please page the [facility] (Director on Call) for the Hospitalists listed on amion for assistance.  08/17/2023, 1:33 PM

## 2023-08-17 NOTE — Progress Notes (Signed)
 Physical Therapy Treatment Patient Details Name: Caleb Wolf MRN: [id number] DOB: 05/27/1941 Today's Date: 08/17/2023   History of Present Illness Caleb Wolf  is a 83 y.o. 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  -Patient is treated with recurrent paracentesis for reaccumulating ascites, most recently this morning, it has been drawn of the abdomen, was cloudy, and results came back with significantly elevated white blood cells, so he was called back and told to come to emergency room room for possible infection, patient reports generalized weakness, fatigue, reports he is compliant with his Lasix, Aldactone and lactulose, reports mild abdominal discomfort, but denies fever, chills.  -Paracentesis performed earlier today showing 1790 white blood cells, Gram stain is negative, labs significant for low potassium at 2.9, creatinine at 2.28 up from 1.9 baseline, but cell count within normal limit, lactic acid elevated at 2.9, patient received IV Rocephin after blood cultures were sent, and [facility] hospitalist consulted to admit.    PT Comments  PT unable to have a complete treatment as pt had significant loose bowel movement while exercising.  PT exhibited increased need for assistance from supine to sit but this might have been due to the environmental condition.  Pt transferred from bed to commode with noted soiling of skin and socks.  Aide in room to assist with cleansing.     If plan is discharge home, recommend the following: A little help with walking and/or transfers;A little help with bathing/dressing/bathroom;Help with stairs or ramp for entrance;Assistance with cooking/housework   Can travel by private vehicle      yes  Equipment Recommendations  None recommended by PT    Recommendations for Other Services  none     Precautions / Restrictions Precautions Precautions:  Fall Restrictions Weight Bearing Restrictions Per Provider Order: No     Mobility  Bed Mobility Overal bed mobility: Needs Assistance Bed Mobility: Supine to Sit     Supine to sit: Min assist     General bed mobility comments: labored movement    Transfers Overall transfer level: Needs assistance Equipment used: Rolling walker (2 wheels) Transfers: Sit to/from Stand, Bed to chair/wheelchair/BSC Sit to Stand: Contact guard assist   Step pivot transfers: Supervision, Contact guard assist       General transfer comment: slightly labored movement with fair/good return for transferring to  commode i    Ambulation/Gait               General Gait Details: Not completed due to pt having a loose bowel movement during exercise.         Communication Communication Communication: No apparent difficulties  Cognition Arousal: Alert Behavior During Therapy: WFL for tasks assessed/performed   PT - Cognitive impairments: No apparent impairments                         Following commands: Intact      Cueing Cueing Techniques: Verbal cues  Exercises General Exercises - Lower Extremity Ankle Circles/Pumps: Both, 10 reps Mini-Sqauts:  (bridging x 5 with pt having a bout of loose bowel)    General Comments        Pertinent Vitals/Pain Pain Assessment Faces Pain Scale: Hurts a little bit Pain Location: stomach Pain Descriptors / Indicators: Sore, Discomfort, Grimacing Pain Intervention(s): Limited activity within patient's tolerance    Home Living  Normally lives with wife.  Wife verbalizes  that she is unsure if she  Can take him home like this at this time.  Pt Home is level and pt has  A rolling walking                         Prior Function     Lives with wife who is usually able to assist him.  Pt uses AD       PT Goals (current goals can now be found in the care plan section) Acute Rehab PT Goals Patient Stated Goal: return home  with family to assist PT Goal Formulation: With patient/family Time For Goal Achievement: 08/20/23 Potential to Achieve Goals: Good Progress towards PT goals: Not progressing toward goals - comment (Unable to complete whole treatment secondary to pt having a loose bowel movement and needing to sit on the commode and be cleaned.)    Frequency    Min 3X/week      PT Plan      Co-evaluation              AM-PAC PT "6 Clicks" Mobility   Outcome Measure                   End of Session   Activity Tolerance: Patient limited by fatigue Patient left: with nursing/sitter in room;with family/visitor present;Other (comment) (on bedside commode) Nurse Communication: Mobility status PT Visit Diagnosis: Unsteadiness on feet (R26.81);Other abnormalities of gait and mobility (R26.89);Muscle weakness (generalized) (M62.81)     Time: 1100-1110 PT Time Calculation (min) (ACUTE ONLY): 10 min  Charges:    $Therapeutic Exercise: 8-22 mins PT General Charges $$ ACUTE PT VISIT: 1 Visit                    Virgina Organ, PT CLT [phone]   08/17/2023, 11:31 AM

## 2023-08-18 DIAGNOSIS — K429 Umbilical hernia without obstruction or gangrene: Secondary | ICD-10-CM | POA: Diagnosis not present

## 2023-08-18 DIAGNOSIS — L03316 Cellulitis of umbilicus: Secondary | ICD-10-CM | POA: Diagnosis not present

## 2023-08-18 DIAGNOSIS — K652 Spontaneous bacterial peritonitis: Secondary | ICD-10-CM | POA: Diagnosis not present

## 2023-08-18 LAB — COMPREHENSIVE METABOLIC PANEL
ALT: 66 U/L — ABNORMAL HIGH (ref 0–44)
AST: 87 U/L — ABNORMAL HIGH (ref 15–41)
Albumin: 2.7 g/dL — ABNORMAL LOW (ref 3.5–5.0)
Alkaline Phosphatase: 65 U/L (ref 38–126)
Anion gap: 10 (ref 5–15)
BUN: 24 mg/dL — ABNORMAL HIGH (ref 8–23)
CO2: 22 mmol/L (ref 22–32)
Calcium: 8.3 mg/dL — ABNORMAL LOW (ref 8.9–10.3)
Chloride: 106 mmol/L (ref 98–111)
Creatinine, Ser: 2.28 mg/dL — ABNORMAL HIGH (ref 0.61–1.24)
GFR, Estimated: 28 mL/min — ABNORMAL LOW (ref >60)
Glucose, Bld: 94 mg/dL (ref 70–99)
Potassium: 3.9 mmol/L (ref 3.5–5.1)
Sodium: 138 mmol/L (ref 135–145)
Total Bilirubin: 0.4 mg/dL (ref 0.0–1.2)
Total Protein: 5.3 g/dL — ABNORMAL LOW (ref 6.5–8.1)

## 2023-08-18 LAB — CBC
HCT: 28.7 % — ABNORMAL LOW (ref 39.0–52.0)
Hemoglobin: 8.9 g/dL — ABNORMAL LOW (ref 13.0–17.0)
MCH: 29.1 pg (ref 26.0–34.0)
MCHC: 31 g/dL (ref 30.0–36.0)
MCV: 93.8 fL (ref 80.0–100.0)
Platelets: 246 10*3/uL (ref 150–400)
RBC: 3.06 MIL/uL — ABNORMAL LOW (ref 4.22–5.81)
RDW: 14.6 % (ref 11.5–15.5)
WBC: 8 10*3/uL (ref 4.0–10.5)
nRBC: 0 % (ref 0.0–0.2)

## 2023-08-18 MED ORDER — SACCHAROMYCES BOULARDII 250 MG PO CAPS
250.0000 mg | ORAL_CAPSULE | Freq: Two times a day (BID) | ORAL | Status: DC
Start: 2023-08-18 — End: 2023-08-19
  Administered 2023-08-18 – 2023-08-19 (×3): 250 mg via ORAL
  Filled 2023-08-18 (×3): qty 1

## 2023-08-18 MED ORDER — LACTULOSE 10 GM/15ML PO SOLN: 45.0000 g | Freq: Every day | ORAL | Status: DC

## 2023-08-18 MED ORDER — ZINC OXIDE 40 % EX PSTE
PASTE | Freq: Three times a day (TID) | CUTANEOUS | Status: DC
Start: 2023-08-18 — End: 2023-08-19
  Filled 2023-08-18: qty 57

## 2023-08-18 MED ORDER — LACTULOSE 10 GM/15ML PO SOLN: 45.0000 g | Freq: Three times a day (TID) | ORAL | Status: DC

## 2023-08-18 NOTE — TOC Progression Note (Signed)
 Transition of Care [facility]) - Progression Note    Patient Details  Name: Caleb Wolf MRN: [id number] Date of Birth: 10-10-1940  Transition of [facility]) CM/SW Contact  Karn [place], [place] Phone Number: 08/18/2023, 3:47 PM  Clinical Narrative: Pt's wife accepts bed at [facility]. Facility notified. Will start insurance authorization.       Expected Discharge Plan: Home w Home Health Services Barriers to Discharge: Continued Medical Work up  Expected Discharge Plan and Services In-house Referral: Clinical Social Work   Post Acute Care Choice: Home Health Living arrangements for the past 2 months: Single Family Home                           HH Arranged: PT HH Agency: [facility] Date HH Agency Contacted: 08/13/23 Time HH Agency Contacted: 1116 Representative spoke with at [facility] Agency: Clydie Braun   Social Determinants of Health (SDOH) Interventions SDOH Screenings   Food Insecurity: No Food Insecurity (08/12/2023)  Housing: Low Risk  (08/12/2023)  Transportation Needs: No Transportation Needs (08/12/2023)  Utilities: Not At Risk (08/12/2023)  Depression (PHQ2-9): Low Risk  (01/21/2023)  Social Connections: Socially Integrated (08/12/2023)  Tobacco Use: Medium Risk (08/12/2023)    Readmission Risk Interventions    08/14/2023    9:57 AM 01/16/2023   11:30 AM 12/16/2021   11:51 AM  Readmission Risk Prevention Plan  Transportation Screening Complete Complete Complete  PCP or Specialist Appt within 5-7 Days   Complete  Home Care Screening   Complete  Medication Review (RN CM)   Complete  HRI or Home Care Consult Complete Complete   Social Work Consult for Recovery Care Planning/Counseling Complete Complete   Palliative Care Screening Not Applicable Not Applicable   Medication Review Oceanographer) Complete Complete

## 2023-08-18 NOTE — Progress Notes (Signed)
 PT Cancellation Note  Patient Details Name: Caleb Wolf MRN: [id number] DOB: [birth year]   Cancelled Treatment:      Attempted PT session.  Pt eating breakfast.  Will try again later if available.  Becky Sax, LPTA/CLT; CBIS [phone]  Juel Burrow 08/18/2023, 11:42 AM

## 2023-08-18 NOTE — Progress Notes (Signed)
 PROGRESS NOTE  Caleb Wolf  XBM:[id number] DOB: 12-21-1940 DOA: 08/12/2023 PCP: Carylon Perches, MD   Chief Complaint  Patient presents with   Weakness   Level of care: Med-Surg  Brief Admission History:   83 y.o. 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 -Patient is treated with recurrent paracentesis for reaccumulating ascites, most recently this morning, it has been drawn of the abdomen, was cloudy, and results came back with significantly elevated white blood cells, so he was called back and told to come to emergency room room for possible infection, patient reports generalized weakness, fatigue, reports he is compliant with his Lasix, Aldactone and lactulose, reports mild abdominal discomfort, but denies fever, chills. -Paracentesis performed earlier today showing 1790 white blood cells, Gram stain is negative, labs significant for low potassium at 2.9, creatinine at 2.28 up from 1.9 baseline, but cell count within normal limit, lactic acid elevated at 2.9, patient received IV Rocephin after blood cultures were sent, and [facility] hospitalist consulted to admit.   Assessment and Plan:  Recurrent ascites Recurrent SBP -Patient with known history of liver cirrhosis, history of recurrent SBP in the past. 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 -paracentesis 3/12 with 1794 nucleated cells. -Follow-up blood cultures, follow on final peritoneal fluid cultures, so far negative Gram stain. -treated with IV Rocephin 2 g daily x 5 days   -ID curbsided by GI and recommended resuming his daily Bactrim DS for SBP prevention indefinitely, which he will begin at discharge  -outpatient follow up with [facility] for ongoing management    Liver cirrhosis -[place] history of liver cirrhosis in the past, recurrent ascites and hepatic encephalopathy status post TIPS. -Continue with home medications including  lactulose, Aldactone, furosemide.  Periumbilical Cellulitis - IMPROVING  -discussed with Dr. Jena Gauss, we requested surgery evaluation  -held rivaroxaban for now for surgery evaluation and now it has been restarted -reached out to Dr. Robyne Peers to request consultation and broadened antibiotics coverage zosyn and vanc per pharm D dosing requested -WBC has been stable and clinically has been improving -treated with Zosyn/vanc briefly but plan to resume daily Bactrim DS SBP prophylaxis at discharge -DC zosyn/vanc today due to severe diarrhea  Severe Diarrhea - secondary to broad spectrum antibiotics and lactulose - DC antibiotics and HOLD Lactulose today  - added probiotics  Elevated PT/INR -INR up to 3, difficult to interpret significance of this as he is on rivaroxaban, no bleeding complications -defer vitamin K to GI service -if diarrhea persists get more work up    Proximal A-fib on chronic anticoagulation -Continue with amiodarone -held rivaroxaban 3/15 for surgery evaluation -discussed with pharm D, restarted rivaroxaban 3/17   Hyperlipidemia -Continue atorvastatin   BPH -Continue doxazosin and finasteride   GERD -Continue Protonix   Weakness -PT eval appreciated with recommendation for SNF placement -TOC consulted to find SNF bed    Lactic acidosis -Held Lasix for brief holiday, resumed 3/14 home dosing   Hypokalemia -added daily Kdur 40 meq tablets  -recheck in AM    AKI on CKD stage IIIb -Held Lasix for brief holiday, resumed 3/14   -discussed with GI given bump in creatinine reduced lasix dosing to 80 mg in AM and 40 mg in PM.    DVT prophylaxis: rivaroxaban Code Status: Full  Family Communication: wife at bedside updated daily  Disposition: working on SNF placement after PT reassessed today    Consultants:  GI  Procedures:  Paracentesis 3/12  Antimicrobials:  Ceftriaxone 3/12>>3/15 Zosyn 3/15>>3/18 Vanc 3/15>>3/18    Subjective: Pt started  having frequent diarrhea spells yesterday.  He is more weak today and needs more help getting around per wife.  She is not sure she can handle him like this at home.   Objective: Vitals:   08/18/23 0433 08/18/23 1004 08/18/23 1316 08/18/23 1603  BP: (!) 103/54 101/87 (!) 122/49 124/65  Pulse: (!) 48 (!) 52 (!) 47 (!) 46  Resp: 18 20 12 12   Temp: 98.3 F (36.8 C) 98.2 F (36.8 C) 98 F (36.7 C) 98.5 F (36.9 C)  TempSrc:  Oral Oral Oral  SpO2: 96% 97% 96% 97%  Weight:      Height:        Intake/Output Summary (Last 24 hours) at 08/18/2023 1622 Last data filed at 08/18/2023 1300 Gross per 24 hour  Intake 532.86 ml  Output 750 ml  Net -217.14 ml   Filed Weights   08/12/23 1438  Weight: 81.6 kg   Examination:  General exam: Appears calm and comfortable  Respiratory system: Clear to auscultation. Respiratory effort normal. Cardiovascular system: normal S1 & S2 heard. No JVD, murmurs, rubs, gallops or clicks. No pedal edema. Gastrointestinal system: Abdomen is mildly distended, soft and nontender. No organomegaly or masses felt. Normal bowel sounds heard. Umbilical hernia much less tender, no erythema seen.  Central nervous system: Alert and oriented. No focal neurological deficits. Extremities: Symmetric 5 x 5 power. Skin: No rashes, lesions or ulcers. Psychiatry: Judgement and insight appear diminished. Mood & affect appropriate.   Data Reviewed: I have personally reviewed following labs and imaging studies  CBC: Recent Labs  Lab 08/13/23 0434 08/15/23 0338 08/16/23 0433 08/17/23 0434 08/18/23 0423  WBC 6.4 6.7 6.8 7.6 8.0  HGB 8.7* 8.6* 8.6* 9.0* 8.9*  HCT 27.8* 28.9* 28.0* 28.9* 28.7*  MCV 92.4 96.3 93.3 93.8 93.8  PLT 210 228 210 221 246    Basic Metabolic Panel: Recent Labs  Lab 08/14/23 0443 08/15/23 0338 08/16/23 0433 08/17/23 0434 08/18/23 0423  NA 138 135 138 138 138  K 3.4* 3.1* 3.6 4.0 3.9  CL 105 104 105 106 106  CO2 24 22 23 23 22   GLUCOSE  92 99 108* 103* 94  BUN 27* 24* 23 24* 24*  CREATININE 1.92* 2.09* 2.08* 2.27* 2.28*  CALCIUM 8.1* 8.2* 8.3* 8.4* 8.3*  MG 2.1  --   --   --   --     CBG: Recent Labs  Lab 08/12/23 1549  GLUCAP 106*    Recent Results (from the past 240 hours)  Culture, body fluid w Gram Stain-bottle     Status: None   Collection Time: 08/12/23 10:45 AM   Specimen: Ascitic  Result Value Ref Range Status   Specimen Description ASCITIC  Final   Special Requests BOTTLES DRAWN AEROBIC AND ANAEROBIC 10 CC  Final   Culture   Final    NO GROWTH 5 DAYS Performed at [facility], [address], [place], [place] [address]    Report Status 08/17/2023 FINAL  Final  Gram stain     Status: None   Collection Time: 08/12/23 10:45 AM   Specimen: Ascitic  Result Value Ref Range Status   Specimen Description ASCITIC  Final   Special Requests NONE  Final   Gram Stain   Final    ASCITIC CYTOSPIN SMEAR WBC PRESENT,BOTH PMN AND MONONUCLEAR NO ORGANISMS SEEN Performed at [facility], [address], [place], [place] [address]    Report Status 08/12/2023 FINAL  Final  Blood culture (routine x 2)     Status: None   Collection Time: 08/12/23  3:53 PM   Specimen: BLOOD  Result Value Ref Range Status   Specimen Description BLOOD BLOOD RIGHT FOREARM  Final   Special Requests   Final    BOTTLES DRAWN AEROBIC AND ANAEROBIC Blood Culture results may not be optimal due to an inadequate volume of blood received in culture bottles   Culture   Final    NO GROWTH 5 DAYS Performed at [facility], [address] [place], [place] [address]    Report Status 08/17/2023 FINAL  Final  Blood culture (routine x 2)     Status: None   Collection Time: 08/12/23  3:53 PM   Specimen: BLOOD  Result Value Ref Range Status   Specimen Description BLOOD LEFT ANTECUBITAL  Final   Special Requests   Final    BOTTLES DRAWN AEROBIC AND ANAEROBIC Blood Culture results may not be optimal due to an inadequate volume of blood received in  culture bottles   Culture   Final    NO GROWTH 5 DAYS Performed at [facility], [address], [place], [place] [address]    Report Status 08/17/2023 FINAL  Final     Radiology Studies: No results found.   Scheduled Meds:  amiodarone  200 mg Oral Daily   atorvastatin  80 mg Oral QPM   cholecalciferol  2,000 Units Oral Daily   cyanocobalamin  1,000 mcg Oral Once per day on Tuesday Friday   doxazosin  4 mg Oral q AM   empagliflozin  10 mg Oral QAC breakfast   feeding supplement  1 Container Oral Q1500   finasteride  5 mg Oral QPM   furosemide  40 mg Oral Q1500   furosemide  80 mg Oral Daily   [START ON 08/20/2023] lactulose  45 g Oral Daily   pantoprazole  40 mg Oral QAC breakfast   potassium chloride  40 mEq Oral BID   rivaroxaban  15 mg Oral Q supper   saccharomyces boulardii  250 mg Oral BID   sertraline  25 mg Oral QHS   spironolactone  25 mg Oral Daily   Zinc Oxide   Topical TID   Continuous Infusions:    LOS: 5 days   Time spent: 55 mins  Maleah Rabago Laural Benes, MD How to contact the [facility] Attending or Consulting provider 7A - 7P or covering provider during after hours 7P -7A, for this patient?  Check the care team in [facility] and look for a) attending/consulting TRH provider listed and b) the [facility] team listed Log into [email or web address] to find provider on call.  Locate the [facility] provider you are looking for under [facility] and page to a number that you can be directly reached. If you still have difficulty reaching the provider, please page the [facility] (Director on Call) for the Hospitalists listed on amion for assistance.  08/18/2023, 4:22 PM

## 2023-08-18 NOTE — TOC Progression Note (Signed)
 Transition of Care [facility]) - Progression Note    Patient Details  Name: Caleb Wolf MRN: [id number] Date of Birth: 07-11-1940  Transition of Care [facility] CM/SW Contact  Karn Cassis, [place] Phone Number: 08/18/2023, 3:24 PM  Clinical Narrative: PT now recommending SNF. Discussed with pt's wife who is agreeable. Reviewed Medicare.gov ratings. Will initiate bed search and follow up with bed offers when available.       Expected Discharge Plan: Home w Home Health Services Barriers to Discharge: Continued Medical Work up  Expected Discharge Plan and Services In-house Referral: Clinical Social Work   Post Acute Care Choice: Home Health Living arrangements for the past 2 months: Single Family Home                           HH Arranged: PT HH Agency: [facility] Date HH Agency Contacted: 08/13/23 Time HH Agency Contacted: 1116 Representative spoke with at [facility] Agency: Clydie Braun   Social Determinants of Health (SDOH) Interventions SDOH Screenings   Food Insecurity: No Food Insecurity (08/12/2023)  Housing: Low Risk  (08/12/2023)  Transportation Needs: No Transportation Needs (08/12/2023)  Utilities: Not At Risk (08/12/2023)  Depression (PHQ2-9): Low Risk  (01/21/2023)  Social Connections: Socially Integrated (08/12/2023)  Tobacco Use: Medium Risk (08/12/2023)    Readmission Risk Interventions    08/14/2023    9:57 AM 01/16/2023   11:30 AM 12/16/2021   11:51 AM  Readmission Risk Prevention Plan  Transportation Screening Complete Complete Complete  PCP or Specialist Appt within 5-7 Days   Complete  Home Care Screening   Complete  Medication Review (RN CM)   Complete  HRI or Home Care Consult Complete Complete   Social Work Consult for Recovery Care Planning/Counseling Complete Complete   Palliative Care Screening Not Applicable Not Applicable   Medication Review Oceanographer) Complete Complete

## 2023-08-18 NOTE — Progress Notes (Signed)
 Physical Therapy Treatment Patient Details Name: Caleb Wolf MRN: [id number] DOB: January 19, 1941 Today's Date: 08/18/2023   History of Present Illness Caleb Wolf  is a 83 y.o. 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  -Patient is treated with recurrent paracentesis for reaccumulating ascites, most recently this morning, it has been drawn of the abdomen, was cloudy, and results came back with significantly elevated white blood cells, so he was called back and told to come to emergency room room for possible infection, patient reports generalized weakness, fatigue, reports he is compliant with his Lasix, Aldactone and lactulose, reports mild abdominal discomfort, but denies fever, chills.  -Paracentesis performed earlier today showing 1790 white blood cells, Gram stain is negative, labs significant for low potassium at 2.9, creatinine at 2.28 up from 1.9 baseline, but cell count within normal limit, lactic acid elevated at 2.9, patient received IV Rocephin after blood cultures were sent, and [facility] hospitalist consulted to admit.    PT Comments  Patient had difficulty scooting to EOB due to weakness, very unsteady on feet and limited to walking in room, due to poor standing balance, fatigue and near fall requiring Mod/max assist to avoid complete loss of balance when transferring to chair. Patient tolerated sitting up in chair after therapy with spouse present. Patient will benefit from continued skilled physical therapy in hospital and recommended venue below to increase strength, balance, endurance for safe ADLs and gait.      If plan is discharge home, recommend the following: A little help with bathing/dressing/bathroom;Help with stairs or ramp for entrance;Assistance with cooking/housework;A lot of help with walking and/or transfers   Can travel by private vehicle     No  Equipment  Recommendations  None recommended by PT    Recommendations for Other Services       Precautions / Restrictions Precautions Precautions: Fall Restrictions Weight Bearing Restrictions Per Provider Order: No     Mobility  Bed Mobility Overal bed mobility: Needs Assistance Bed Mobility: Supine to Sit     Supine to sit: Min assist, Mod assist     General bed mobility comments: had most difficulty scooting to EOB    Transfers Overall transfer level: Needs assistance Equipment used: Rolling walker (2 wheels) Transfers: Sit to/from Stand, Bed to chair/wheelchair/BSC Sit to Stand: Min assist   Step pivot transfers: Min assist       General transfer comment: unsteady labored movement, had to use side rails during transfer to/from commode in bathroom, near fall when transferring to chair    Ambulation/Gait Ambulation/Gait assistance: Mod assist Gait Distance (Feet): 15 Feet Assistive device: Rolling walker (2 wheels) Gait Pattern/deviations: Decreased step length - right, Decreased step length - left, Decreased stride length, Steppage, Decreased dorsiflexion - right, Decreased dorsiflexion - left Gait velocity: slow     General Gait Details: limited to ambulating in room due to poor standing balance, fatigue and near fall when returning to bedside   Stairs             Wheelchair Mobility     Tilt Bed    Modified Rankin (Stroke Patients Only)       Balance Overall balance assessment: Needs assistance Sitting-balance support: Feet supported, No upper extremity supported Sitting balance-Leahy Scale: Fair Sitting balance - Comments: fair/good seated at EOB   Standing balance support: Reliant on assistive device for balance, During functional activity, Bilateral upper extremity supported Standing  balance-Leahy Scale: Poor Standing balance comment: fair/poor using RW                            Communication Communication Communication: No  apparent difficulties  Cognition Arousal: Alert Behavior During Therapy: WFL for tasks assessed/performed   PT - Cognitive impairments: No apparent impairments                         Following commands: Intact      Cueing Cueing Techniques: Verbal cues  Exercises General Exercises - Lower Extremity Long Arc Quad: Seated, AROM, Strengthening, Both, 10 reps Hip Flexion/Marching: Seated, AAROM, Strengthening, Both, 10 reps Toe Raises: Seated, Standing, Strengthening, Both, 10 reps Heel Raises: Seated, AROM, Strengthening, Both, 10 reps    General Comments        Pertinent Vitals/Pain Pain Assessment Pain Assessment: Faces Faces Pain Scale: Hurts little more Pain Location: over buttocks Pain Descriptors / Indicators: Sore Pain Intervention(s): Limited activity within patient's tolerance, Monitored during session, Repositioned    Home Living                          Prior Function            PT Goals (current goals can now be found in the care plan section) Acute Rehab PT Goals Patient Stated Goal: return home with family to assist PT Goal Formulation: With patient/family Time For Goal Achievement: 08/25/23 Potential to Achieve Goals: Good Progress towards PT goals: Progressing toward goals    Frequency    Min 3X/week      PT Plan      Co-evaluation              AM-PAC PT "6 Clicks" Mobility   Outcome Measure  Help needed turning from your back to your side while in a flat bed without using bedrails?: A Little Help needed moving from lying on your back to sitting on the side of a flat bed without using bedrails?: A Little Help needed moving to and from a bed to a chair (including a wheelchair)?: A Lot Help needed standing up from a chair using your arms (e.g., wheelchair or bedside chair)?: A Lot Help needed to walk in hospital room?: A Lot Help needed climbing 3-5 steps with a railing? : A Lot 6 Click Score: 14    End of  Session   Activity Tolerance: Patient tolerated treatment well;Patient limited by fatigue Patient left: in chair;with call bell/phone within reach;with family/visitor present Nurse Communication: Mobility status PT Visit Diagnosis: Unsteadiness on feet (R26.81);Other abnormalities of gait and mobility (R26.89);Muscle weakness (generalized) (M62.81)     Time: 4782-9562 PT Time Calculation (min) (ACUTE ONLY): 28 min  Charges:    $Therapeutic Exercise: 8-22 mins $Therapeutic Activity: 8-22 mins PT General Charges $$ ACUTE PT VISIT: 1 Visit                     3:58 PM, 08/18/23 Ocie Bob, MPT Physical Therapist with [facility] 336 [phone] office [phone] mobile phone

## 2023-08-18 NOTE — Care Management Important Message (Signed)
 Important Message  Patient Details  Name: Caleb Wolf MRN: [id number] Date of Birth: Oct 02, 1940   Important Message Given:  Yes - Medicare IM     Corey Harold 08/18/2023, 10:47 AM

## 2023-08-18 NOTE — Plan of Care (Signed)

## 2023-08-18 NOTE — Consult Note (Signed)
 [facility] Liaison Consult Note    08/18/2023  Caleb Wolf Aug 31, 1940 [id number]  *Remote coverage review for [name] Liaison for [name]  Insurance: Monia Pouch Medicare   Primary Care Provider: Carylon Perches, MD, this provider is listed for the transition of care follow up appointments     [facility] Liaison screened the patient remotely at [facility].    The patient was screened for 30 day readmission hospitalization with noted high risk score for unplanned readmission risk 2 hospital admissions in 6 months.  The patient was assessed for potential [facility] Coordination service needs for post hospital transition for care coordination. Review of patient's electronic medical record reveals patient is ***.   Plan: [facility] Liaison will continue to follow progress and disposition to asess for post hospital community care coordination/management needs.  Referral request for community care coordination: ***   [facility] Community Care, Population Health does not replace or interfere with any arrangements made by the Inpatient Transition of Care team.   For questions contact:   Charlesetta Shanks, RN, BSN, CCM [place]  [facility], [facility] Liaison Direct Dial: [phone] or secure chat Email: [email or web address]

## 2023-08-18 NOTE — [place] FL2 (Signed)
 [place] MEDICAID FL2 LEVEL OF CARE FORM     IDENTIFICATION  Patient Name: Caleb Wolf Birthdate: [birth year] Sex: male Admission Date (Current Location): 08/12/2023  [facility] and [place] Number:  [employer] and Address:  [facility],  [address] [place] [address]      Provider Number: [id number]  Attending Physician Name and Address:  Cleora Fleet, MD  Relative Name and Phone Number:       Current Level of Care: Hospital Recommended Level of Care: Skilled Nursing Facility Prior Approval Number:    Date Approved/Denied:   PASRR Number: [id number]  Discharge Plan: SNF    Current Diagnoses: Patient Active Problem List   Diagnosis Date Noted   Umbilical hernia without obstruction and without gangrene 08/16/2023   Cellulitis, umbilical 08/16/2023   Decompensated cirrhosis (HCC) 07/13/2023   Paroxysmal atrial fibrillation (HCC) 07/13/2023   Leukocytosis 01/21/2023   Spontaneous bacterial peritonitis (HCC) 01/14/2023   SBP (spontaneous bacterial peritonitis) (HCC) 12/26/2022   Preop respiratory exam 12/17/2022   Hepatic cirrhosis (HCC) 11/10/2022   Ascites 11/10/2022   Constipation 11/10/2022   Pleural effusion 09/22/2022   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   Ground glass opacity present on imaging of lung 12/31/2021   Hemoptysis 12/13/2021   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) 12/13/2021   CAD (coronary artery disease) 12/10/2021   Chronic respiratory failure with hypoxia (HCC) 12/10/2021   Posterior vitreous detachment of left eye 02/13/2020   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   Vitreous floaters, left 02/13/2020   History of vitrectomy 02/13/2020   Left epiretinal membrane 02/13/2020   Hyperlipidemia 12/07/2019   Low back pain 11/15/2019   Lumbar post-laminectomy syndrome 11/15/2019   Spinal stenosis of lumbar region 11/15/2019   Pain in joint of right hip 06/09/2019   Trigger finger 04/08/2019    Special screening for malignant neoplasms, colon 11/25/2017   Absolute anemia 11/25/2017   Persistent atrial fibrillation (HCC) 07/28/2017   GERD (gastroesophageal reflux disease) 04/28/2017   Chronic kidney disease, stage 3b (HCC) 04/28/2017   Achalasia 12/18/2015   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   Encounter for therapeutic drug monitoring 05/31/2014   Atrial fibrillation with rapid ventricular response (HCC) 05/22/2014   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   Abnormal EKG    NSTEMI (non-ST elevated myocardial infarction) (HCC)    Carotid artery stenosis without cerebral infarction    BPH (benign prostatic hyperplasia) 03/27/2014   Chest pain 02/09/2014   HTN (hypertension) 02/09/2014   Carotid stenosis 08/02/2013   Peripheral edema 08/02/2013   Urge incontinence of urine 10/04/2012   OSA (obstructive sleep apnea) 09/21/2012   Recurrent nephrolithiasis 04/05/2012   Difficulty walking 01/29/2012   Balance disorder 01/29/2012   OA (osteoarthritis) of hip 12/08/2011   History of revision of total replacement of right hip joint 12/08/2011   ED (erectile dysfunction) of organic origin 04/07/2011   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 Stage I to bilateral buttocks with foam dressing.)  Personal Care Assistance Level of Assistance  Bathing, Feeding, Dressing Bathing Assistance: Maximum assistance Feeding assistance: Limited assistance Dressing Assistance: Maximum assistance     Functional Limitations Info  Sight, Hearing, Speech Sight  Info: Impaired Hearing Info: Adequate Speech Info: Adequate    SPECIAL CARE FACTORS FREQUENCY  PT (By licensed PT)     PT Frequency: 5x weekly              Contractures      Additional Factors Info  Code Status, Allergies Code Status Info: Full code Allergies Info: Codeine Sulfate           Current Medications (08/18/2023):  This is the current hospital active medication list Current Facility-Administered Medications  Medication Dose Route Frequency Provider Last Rate Last Admin   albuterol (PROVENTIL) (2.5 MG/3ML) 0.083% nebulizer solution 2.5 mg  2.5 mg Nebulization Q2H PRN Elgergawy, Leana Roe, MD       amiodarone (PACERONE) tablet 200 mg  200 mg Oral Daily Elgergawy, Leana Roe, MD   200 mg at 08/18/23 6962   atorvastatin (LIPITOR) tablet 80 mg  80 mg Oral QPM Elgergawy, Leana Roe, MD   80 mg at 08/17/23 1723   cholecalciferol (VITAMIN D3) 25 MCG (1000 UNIT) tablet 2,000 Units  2,000 Units Oral Daily Elgergawy, Leana Roe, MD   2,000 Units at 08/18/23 0957   cyanocobalamin (VITAMIN B12) tablet 1,000 mcg  1,000 mcg Oral Once per day on Tuesday Friday Elgergawy, Leana Roe, MD   1,000 mcg at 08/18/23 0957   doxazosin (CARDURA) tablet 4 mg  4 mg Oral q AM Elgergawy, Leana Roe, MD   4 mg at 08/18/23 9528   empagliflozin (JARDIANCE) tablet 10 mg  10 mg Oral QAC breakfast Elgergawy, Leana Roe, MD   10 mg at 08/18/23 0957   feeding supplement (BOOST HIGH PROTEIN) liquid 237 mL  1 Container Oral Q1500 Elgergawy, Leana Roe, MD   237 mL at 08/17/23 1529   fentaNYL (SUBLIMAZE) injection 25 mcg  25 mcg Intravenous Q2H PRN Johnson, Clanford L, MD       finasteride (PROSCAR) tablet 5 mg  5 mg Oral QPM Elgergawy, Leana Roe, MD   5 mg at 08/17/23 1723   fluticasone (FLONASE) 50 MCG/ACT nasal spray 1 spray  1 spray Each Nare Daily PRN Elgergawy, Leana Roe, MD       furosemide (LASIX) tablet 40 mg  40 mg Oral Q1500 Gelene Mink, NP   40 mg at 08/17/23 1723   furosemide (LASIX) tablet 80 mg  80 mg Oral  Daily Gelene Mink, NP   80 mg at 08/18/23 4132   hydrALAZINE (APRESOLINE) injection 5 mg  5 mg Intravenous Q4H PRN Elgergawy, Leana Roe, MD       [START ON 08/20/2023] lactulose (CHRONULAC) 10 GM/15ML solution 45 g  45 g Oral Daily Johnson, Clanford L, MD       nitroGLYCERIN (NITROSTAT) SL tablet 0.4 mg  0.4 mg Sublingual Q5 Min x 3 PRN Elgergawy, Leana Roe, MD       ondansetron (ZOFRAN) injection 4 mg  4 mg Intravenous Q6H PRN Johnson, Clanford L, MD   4 mg at 08/16/23 0847   pantoprazole (PROTONIX) EC tablet 40 mg  40 mg Oral QAC breakfast Elgergawy, Leana Roe, MD   40 mg at 08/18/23 0905   potassium chloride SA (KLOR-CON M) CR tablet 40 mEq  40 mEq Oral BID Laural Benes, Clanford L, MD   40 mEq at 08/18/23 [id number]  Rivaroxaban (XARELTO) tablet 15 mg  15 mg Oral Q supper Johnson, Clanford L, MD   15 mg at 08/17/23 1723   saccharomyces boulardii (FLORASTOR) capsule 250 mg  250 mg Oral BID Johnson, Clanford L, MD   250 mg at 08/18/23 1337   sertraline (ZOLOFT) tablet 25 mg  25 mg Oral QHS Johnson, Clanford L, MD   25 mg at 08/17/23 2102   spironolactone (ALDACTONE) tablet 25 mg  25 mg Oral Daily Elgergawy, Leana Roe, MD   25 mg at 08/18/23 1007   tiZANidine (ZANAFLEX) tablet 2 mg  2 mg Oral BID PRN Elgergawy, Leana Roe, MD       Zinc Oxide 40 % PSTE   Topical TID Cleora Fleet, MD         Discharge Medications: Please see discharge summary for a list of discharge medications.  Relevant Imaging Results:  Relevant Lab Results:   Additional Information SSN: [id number]  Karn Cassis, LCSW

## 2023-08-19 DIAGNOSIS — Z951 Presence of aortocoronary bypass graft: Secondary | ICD-10-CM | POA: Diagnosis not present

## 2023-08-19 DIAGNOSIS — I5022 Chronic systolic (congestive) heart failure: Secondary | ICD-10-CM | POA: Diagnosis not present

## 2023-08-19 DIAGNOSIS — R488 Other symbolic dysfunctions: Secondary | ICD-10-CM | POA: Diagnosis not present

## 2023-08-19 DIAGNOSIS — I4891 Unspecified atrial fibrillation: Secondary | ICD-10-CM

## 2023-08-19 DIAGNOSIS — R4189 Other symptoms and signs involving cognitive functions and awareness: Secondary | ICD-10-CM | POA: Diagnosis not present

## 2023-08-19 DIAGNOSIS — K219 Gastro-esophageal reflux disease without esophagitis: Secondary | ICD-10-CM | POA: Diagnosis not present

## 2023-08-19 DIAGNOSIS — I700 Atherosclerosis of aorta: Secondary | ICD-10-CM | POA: Diagnosis not present

## 2023-08-19 DIAGNOSIS — M6281 Muscle weakness (generalized): Secondary | ICD-10-CM | POA: Diagnosis not present

## 2023-08-19 DIAGNOSIS — M48061 Spinal stenosis, lumbar region without neurogenic claudication: Secondary | ICD-10-CM | POA: Diagnosis not present

## 2023-08-19 DIAGNOSIS — N1832 Chronic kidney disease, stage 3b: Secondary | ICD-10-CM | POA: Diagnosis not present

## 2023-08-19 DIAGNOSIS — I4819 Other persistent atrial fibrillation: Secondary | ICD-10-CM | POA: Diagnosis not present

## 2023-08-19 DIAGNOSIS — K7460 Unspecified cirrhosis of liver: Secondary | ICD-10-CM | POA: Diagnosis not present

## 2023-08-19 DIAGNOSIS — L03316 Cellulitis of umbilicus: Secondary | ICD-10-CM | POA: Diagnosis not present

## 2023-08-19 DIAGNOSIS — F32A Depression, unspecified: Secondary | ICD-10-CM | POA: Diagnosis not present

## 2023-08-19 DIAGNOSIS — E43 Unspecified severe protein-calorie malnutrition: Secondary | ICD-10-CM | POA: Diagnosis not present

## 2023-08-19 DIAGNOSIS — K7290 Hepatic failure, unspecified without coma: Secondary | ICD-10-CM | POA: Diagnosis not present

## 2023-08-19 DIAGNOSIS — I714 Abdominal aortic aneurysm, without rupture, unspecified: Secondary | ICD-10-CM | POA: Diagnosis not present

## 2023-08-19 DIAGNOSIS — R748 Abnormal levels of other serum enzymes: Secondary | ICD-10-CM | POA: Diagnosis not present

## 2023-08-19 DIAGNOSIS — K7689 Other specified diseases of liver: Secondary | ICD-10-CM | POA: Diagnosis not present

## 2023-08-19 DIAGNOSIS — I2510 Atherosclerotic heart disease of native coronary artery without angina pectoris: Secondary | ICD-10-CM | POA: Diagnosis not present

## 2023-08-19 DIAGNOSIS — N400 Enlarged prostate without lower urinary tract symptoms: Secondary | ICD-10-CM | POA: Diagnosis not present

## 2023-08-19 DIAGNOSIS — R188 Other ascites: Secondary | ICD-10-CM | POA: Diagnosis not present

## 2023-08-19 DIAGNOSIS — I5032 Chronic diastolic (congestive) heart failure: Secondary | ICD-10-CM | POA: Diagnosis not present

## 2023-08-19 DIAGNOSIS — Z9889 Other specified postprocedural states: Secondary | ICD-10-CM | POA: Diagnosis not present

## 2023-08-19 DIAGNOSIS — I10 Essential (primary) hypertension: Secondary | ICD-10-CM | POA: Diagnosis not present

## 2023-08-19 DIAGNOSIS — E785 Hyperlipidemia, unspecified: Secondary | ICD-10-CM | POA: Diagnosis not present

## 2023-08-19 DIAGNOSIS — R262 Difficulty in walking, not elsewhere classified: Secondary | ICD-10-CM | POA: Diagnosis not present

## 2023-08-19 DIAGNOSIS — J9621 Acute and chronic respiratory failure with hypoxia: Secondary | ICD-10-CM | POA: Diagnosis not present

## 2023-08-19 DIAGNOSIS — K652 Spontaneous bacterial peritonitis: Secondary | ICD-10-CM | POA: Diagnosis not present

## 2023-08-19 DIAGNOSIS — R29818 Other symptoms and signs involving the nervous system: Secondary | ICD-10-CM | POA: Diagnosis not present

## 2023-08-19 DIAGNOSIS — J9611 Chronic respiratory failure with hypoxia: Secondary | ICD-10-CM | POA: Diagnosis not present

## 2023-08-19 DIAGNOSIS — I480 Paroxysmal atrial fibrillation: Secondary | ICD-10-CM | POA: Diagnosis not present

## 2023-08-19 LAB — BASIC METABOLIC PANEL
Anion gap: 8 (ref 5–15)
BUN: 25 mg/dL — ABNORMAL HIGH (ref 8–23)
CO2: 21 mmol/L — ABNORMAL LOW (ref 22–32)
Calcium: 8.3 mg/dL — ABNORMAL LOW (ref 8.9–10.3)
Chloride: 107 mmol/L (ref 98–111)
Creatinine, Ser: 2.44 mg/dL — ABNORMAL HIGH (ref 0.61–1.24)
GFR, Estimated: 26 mL/min — ABNORMAL LOW (ref >60)
Glucose, Bld: 89 mg/dL (ref 70–99)
Potassium: 4.2 mmol/L (ref 3.5–5.1)
Sodium: 136 mmol/L (ref 135–145)

## 2023-08-19 LAB — CBC
HCT: 28.8 % — ABNORMAL LOW (ref 39.0–52.0)
Hemoglobin: 8.9 g/dL — ABNORMAL LOW (ref 13.0–17.0)
MCH: 28.6 pg (ref 26.0–34.0)
MCHC: 30.9 g/dL (ref 30.0–36.0)
MCV: 92.6 fL (ref 80.0–100.0)
Platelets: 245 10*3/uL (ref 150–400)
RBC: 3.11 MIL/uL — ABNORMAL LOW (ref 4.22–5.81)
RDW: 14.7 % (ref 11.5–15.5)
WBC: 7.4 10*3/uL (ref 4.0–10.5)
nRBC: 0 % (ref 0.0–0.2)

## 2023-08-19 LAB — CYTOLOGY - NON PAP

## 2023-08-19 MED ORDER — SULFAMETHOXAZOLE-TRIMETHOPRIM 800-160 MG PO TABS: 1.0000 | ORAL_TABLET | Freq: Every day | ORAL | Status: DC

## 2023-08-19 MED ORDER — FUROSEMIDE 80 MG PO TABS
80.0000 mg | ORAL_TABLET | Freq: Every day | ORAL | Status: DC
Start: 2023-08-20 — End: 2023-09-16

## 2023-08-19 MED ORDER — SULFAMETHOXAZOLE-TRIMETHOPRIM 800-160 MG PO TABS
1.0000 | ORAL_TABLET | Freq: Every day | ORAL | Status: DC
Start: 2023-08-20 — End: 2023-08-19

## 2023-08-19 MED ORDER — FUROSEMIDE 40 MG PO TABS: 40.0000 mg | ORAL_TABLET | Freq: Every day | ORAL | Status: DC

## 2023-08-19 MED ORDER — SERTRALINE HCL 25 MG PO TABS: 25.0000 mg | ORAL_TABLET | Freq: Every day | ORAL | Status: DC

## 2023-08-19 MED ORDER — ORAL CARE MOUTH RINSE: 15.0000 mL | OROMUCOSAL | Status: DC | PRN

## 2023-08-19 NOTE — Discharge Summary (Addendum)
 Physician Discharge Summary   Patient: Caleb Wolf MRN: [id number] DOB: 11-06-1940  Admit date:     08/12/2023  Discharge date: 08/19/23  Discharge Physician: Onalee Hua Alexavier Tsutsui   PCP: Carylon Perches, MD   Recommendations at discharge:   Please follow up with primary care provider within 1-2 weeks  Please repeat BMP and CBC in one week     Hospital Course:  83 y.o. 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 -Patient is treated with recurrent paracentesis for reaccumulating ascites, most recently this morning, it has been drawn of the abdomen, was cloudy, and results came back with significantly elevated white blood cells, so he was called back and told to come to emergency room room for possible infection, patient reports generalized weakness, fatigue, reports he is compliant with his Lasix, Aldactone and lactulose, reports mild abdominal discomfort, but denies fever, chills. -Paracentesis performed earlier today showing 1790 white blood cells, Gram stain is negative, labs significant for low potassium at 2.9, creatinine at 2.28 up from 1.9 baseline, but cell count within normal limit, lactic acid elevated at 2.9, patient received IV Rocephin after blood cultures were sent, and [facility] hospitalist consulted to admit.  Assessment and Plan: Recurrent ascites Recurrent SBP -Patient with known history of liver cirrhosis, history of recurrent SBP in the past. 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 -paracentesis 3/12 with 1794 nucleated cells. -blood and peritoneal cultures neg -treated with IV Rocephin 2 g daily  3/12>>3/15, then was on zosyn 3/15>>3/18 -ID curbsided by GI and recommended resuming his daily Bactrim DS for SBP prevention indefinitely, which he will begin at discharge  -outpatient follow up with [facility] for ongoing management  -evening dose lasix cut in half per GI  this hospitalization   Liver cirrhosis -history of liver cirrhosis in the past, recurrent ascites and hepatic encephalopathy status post TIPS. -Continue with home medications including lactulose, Aldactone, furosemide. -appreciate GI--continue outpt follow up with [facility]   Periumbilical Cellulitis -   -discussed with Dr. Jena Gauss, we requested surgery evaluation  -held rivaroxaban initially for surgery evaluation and now it has been restarted -reached out to Dr. Robyne Peers to request consultation and broadened antibiotics coverage zosyn and vanc per pharm D dosing requested -WBC has been stable and clinically has been improving -treated with Zosyn/vanc briefly but plan to resume daily Bactrim DS SBP prophylaxis at discharge -DC zosyn/vanc today due to severe diarrhea -overall improved   Severe Diarrhea - secondary to broad spectrum antibiotics and lactulose - DC antibiotics and HOLD Lactulose 3/18 - restart lactulose 3/19 - added probiotics   Elevated PT/INR -INR up to 3, difficult to interpret significance of this as he is on rivaroxaban, no bleeding complications -defer vitamin K to GI service -if diarrhea persists get more work up    persistent A-fib on chronic anticoagulation -s/p ablation in 2019 with repeat in 11/2020. He did have recurrent atrial fibrillation afterwards and underwent DCCV in 06/2021 Now in sinus Remains on Amiodarone 200mg  daily -held rivaroxaban 3/15 for surgery evaluation -discussed with pharm D, restarted rivaroxaban 3/17  Chronic HFmrEF  -EF 40-45% in 2018, 55-60% in 01/2018 and 50-55% in 12/2019 and 07/2021, 45-50% in 11/2021 -continue lasix -continue Jardiance   Hyperlipidemia -Continue atorvastatin   BPH -Continue doxazosin and finasteride   GERD -Continue Protonix   Weakness -PT eval appreciated with recommendation for SNF placement -TOC consulted to find  SNF bed    Lactic acidosis -Held Lasix for brief holiday, resumed 3/14  home dosing   Hypokalemia -added daily Kdur 40 meq tablets  -recheck in AM    AKI on CKD stage IIIb -Held Lasix for brief holiday, resumed 3/14   -baseline creatinine 1.8-2.0 -serum creatinine up to 2.44 -discussed with GI given bump in creatinine reduced lasix dosing to 80 mg in AM and 40 mg in PM.    Consultants: GI Procedures performed: paracentesis  Disposition: Skilled nursing facility Diet recommendation:  Low sodium DISCHARGE MEDICATION: Allergies as of 08/19/2023       Reactions   Codeine Sulfate Nausea Only        Medication List     TAKE these medications    acetaminophen 500 MG tablet Commonly known as: TYLENOL Take 1,000 mg by mouth every 6 (six) hours as needed for moderate pain.   amiodarone 200 MG tablet Commonly known as: PACERONE TAKE 1 TABLET BY MOUTH EVERY DAY   atorvastatin 80 MG tablet Commonly known as: LIPITOR TAKE 1 TABLET BY MOUTH EVERY DAY IN THE EVENING   cyanocobalamin 1000 MCG tablet Commonly known as: VITAMIN B12 Take 1,000 mcg by mouth 2 (two) times a week. Tuesday and Friday   doxazosin 4 MG tablet Commonly known as: CARDURA Take 4 mg by mouth in the morning.   feeding supplement Liqd Take 1 Container by mouth daily in the afternoon.   finasteride 5 MG tablet Commonly known as: PROSCAR Take 5 mg by mouth every evening.   fluticasone 50 MCG/ACT nasal spray Commonly known as: FLONASE Place 1 spray into both nostrils daily as needed for allergies or rhinitis.   furosemide 40 MG tablet Commonly known as: LASIX Take 1 tablet (40 mg total) by mouth daily in the afternoon. What changed: You were already taking a medication with the same name, and this prescription was added. Make sure you understand how and when to take each.   furosemide 80 MG tablet Commonly known as: LASIX Take 1 tablet (80 mg total) by mouth daily. Start taking on: August 20, 2023 What changed: when to take this   Jardiance 10 MG Tabs tablet Generic  drug: empagliflozin TAKE 1 TABLET BY MOUTH DAILY BEFORE BREAKFAST.   lactulose 10 GM/15ML solution Commonly known as: CHRONULAC Take 67.5 mLs (45 g total) by mouth 3 (three) times daily. Titrate dosage of lactulose to 2-3 soft bowels per day What changed:  how much to take when to take this additional instructions   nitroGLYCERIN 0.4 MG SL tablet Commonly known as: NITROSTAT Place 1 tablet (0.4 mg total) under the tongue every 5 (five) minutes x 3 doses as needed for chest pain.   pantoprazole 40 MG tablet Commonly known as: PROTONIX Take 1 tablet (40 mg total) by mouth daily before breakfast.   Rivaroxaban 15 MG Tabs tablet Commonly known as: XARELTO Take 1 tablet (15 mg total) by mouth daily with supper.   sertraline 25 MG tablet Commonly known as: ZOLOFT Take 1 tablet (25 mg total) by mouth at bedtime.   spironolactone 25 MG tablet Commonly known as: ALDACTONE TAKE 1 TABLET (25 MG TOTAL) BY MOUTH DAILY.   sulfamethoxazole-trimethoprim 800-160 MG tablet Commonly known as: BACTRIM DS Take 1 tablet by mouth daily. Start taking on: August 20, 2023   tiZANidine 2 MG tablet Commonly known as: ZANAFLEX Take 2 mg by mouth 2 (two) times daily as needed for muscle spasms.   Vitamin D 50 MCG (2000 UT)  Caps Take 2,000 Units by mouth daily.         Contact information for follow-up providers     Care, [facility] Follow up.   Why: Will contact you to schedule home health visits. Contact information: [address] [place] [address] [phone]              Contact information for after-discharge care     Destination     [facility]erred SN[facility] .   Service: Skilled Nursing Contact information: [address] [place] [address] [phone]                    Discharge Exam: Ceasar Mons Weights   08/12/23 1438  Weight: 81.6 kg   HEENT:  [place]/AT, No thrush, no icterus CV:  RRR, no rub, no S3, no  S4 Lung:  CTA, no wheeze, no rhonchi Abd:  soft/+BS, NT Ext:  1 + LE edema, no lymphangitis, no synovitis, no rash   Condition at discharge: stable  The results of significant diagnostics from this hospitalization (including imaging, microbiology, ancillary and laboratory) are listed below for reference.   Imaging Studies: CT ABDOMEN PELVIS W CONTRAST Addendum Date: 08/15/2023 ADDENDUM REPORT: 08/15/2023 17:02 ADDENDUM: Upon further review and comparison to CT scan 06/26/2023, the fluid-filled umbilical hernia has increased in volume compared to prior. The hernia sac is now bilobed/hourglass shaped with a portion within the peritoneal space and a portion superficial to the peritoneal space ( best seen on sagittal image 90/series 5). The intraperitoneal portion is increased in volume compared to prior. The wall of the hernia sac is enhancing. Concern for infection or inflammation of the enlarging the fluid-filled umbilical hernia sac. Findings conveyed to Dr Benard Rink on 08/15/2023  at17:01. Electronically Signed   By: Genevive Bi [name]   On: 08/15/2023 17:02   Result Date: 08/15/2023 CLINICAL DATA:  Abdominal pain, acute, nonlocalized umbilical pain with erythema EXAM: CT ABDOMEN AND PELVIS WITH CONTRAST TECHNIQUE: Multidetector CT imaging of the abdomen and pelvis was performed using the standard protocol following bolus administration of intravenous contrast. RADIATION DOSE REDUCTION: This exam was performed according to the departmental dose-optimization program which includes automated exposure control, adjustment of the mA and/or kV according to patient size and/or use of iterative reconstruction technique. CONTRAST:  80mL OMNIPAQUE IOHEXOL 300 MG/ML  SOLN COMPARISON:  06/26/2023 FINDINGS: Lower chest: Small pleural effusions, right greater than left, grossly stable. Dependent atelectasis in the lung bases left greater than right as before. Sternotomy wires. Hepatobiliary: Patent TIPS. No focal  liver lesion or biliary ductal dilatation. Gallbladder incompletely distended, without calcified gallstone. Pancreas: Unremarkable. No pancreatic ductal dilatation or surrounding inflammatory changes. Spleen: Normal in size without focal abnormality. Adrenals/Urinary Tract: No adrenal mass. Marked right renal parenchymal atrophy as before. 3 mm calculus, peripherally in the lower pole left renal collecting system. No hydronephrosis or ureterectasis. Multiple cortical lesions in both kidneys, some of which can be characterized as cysts, largest 3.3 cm 6 HU left upper pole; no followup recommended. Urinary bladder is decompressed. Stomach/Bowel: Stomach is partially distended, without acute finding. Small bowel is nondilated. Appendix not identified. The colon is partially distended by gas and fluid. Scattered diverticula without adjacent inflammatory change. Vascular/Lymphatic: Moderate calcified aortoiliac plaque without aneurysm. Patent interval TIPS placement from the main portal vein through the middle hepatic vein. Native portal vein and SMV remain patent. Splenic vein patent. No abdominal or pelvic adenopathy. Reproductive: Prostate is unremarkable. Other: Scattered  small to moderate pelvic, perisplenic and perihepatic ascites. Fluid distends umbilical hernia. No free air. Musculoskeletal: Stable right hip arthroplasty. Mild left hip DJD. Multi level lumbar spondylitic change. Slight progression of subacute L1 vertebral compression deformity. Sternotomy wires. IMPRESSION: 1. No acute findings. 2. Interval TIPS, remains patent. 3. Small-moderate ascites, decreased from previous. 4. Stable small pleural effusions. 5. Nonobstructive left nephrolithiasis. 6.  Aortic Atherosclerosis (ICD10-I70.0). Electronically Signed: By: Corlis Leak [name] On: 08/13/2023 21:17   US Paracentesis Result Date: 08/12/2023 INDICATION: Cirrhosis, recurrent ascites, recent tips 07/13/2023 EXAM: ULTRASOUND GUIDED PARACENTESIS  MEDICATIONS: 1% lidocaine local COMPLICATIONS: None immediate. PROCEDURE: Informed written consent was obtained from the patient after a discussion of the risks, benefits and alternatives to treatment. A timeout was performed prior to the initiation of the procedure. Initial ultrasound scanning demonstrates a large amount of ascites within the left lower abdominal quadrant. The right lower abdomen was prepped and draped in the usual sterile fashion. 1% lidocaine was used for local anesthesia. Following this, a 6 Fr Safe-T-Centesis catheter was introduced. An ultrasound image was saved for documentation purposes. The paracentesis was performed. The catheter was removed and a dressing was applied. The patient tolerated the procedure well without immediate post procedural complication. Patient received post-procedure intravenous albumin; see nursing notes for details. FINDINGS: A total of approximately 4 L of blood tinged exudative peritoneal fluid was removed. Samples were sent to the laboratory as requested by the clinical team. IMPRESSION: Successful ultrasound-guided paracentesis yielding 4.0 liters of peritoneal fluid. Electronically Signed   By: Judie Petit.  Shick [name]   On: 08/12/2023 11:26    Microbiology: Results for orders placed or performed during the hospital encounter of 08/12/23  Blood culture (routine x 2)     Status: None   Collection Time: 08/12/23  3:53 PM   Specimen: BLOOD  Result Value Ref Range Status   Specimen Description BLOOD BLOOD RIGHT FOREARM  Final   Special Requests   Final    BOTTLES DRAWN AEROBIC AND ANAEROBIC Blood Culture results may not be optimal due to an inadequate volume of blood received in culture bottles   Culture   Final    NO GROWTH 5 DAYS Performed at [facility], [address] [place], [place] [address]    Report Status 08/17/2023 FINAL  Final  Blood culture (routine x 2)     Status: None   Collection Time: 08/12/23  3:53 PM   Specimen: BLOOD  Result Value Ref  Range Status   Specimen Description BLOOD LEFT ANTECUBITAL  Final   Special Requests   Final    BOTTLES DRAWN AEROBIC AND ANAEROBIC Blood Culture results may not be optimal due to an inadequate volume of blood received in culture bottles   Culture   Final    NO GROWTH 5 DAYS Performed at [facility], [address], [place], [place] [address]    Report Status 08/17/2023 FINAL  Final    Labs: CBC: Recent Labs  Lab 08/15/23 0338 08/16/23 0433 08/17/23 0434 08/18/23 0423 08/19/23 0425  WBC 6.7 6.8 7.6 8.0 7.4  HGB 8.6* 8.6* 9.0* 8.9* 8.9*  HCT 28.9* 28.0* 28.9* 28.7* 28.8*  MCV 96.3 93.3 93.8 93.8 92.6  PLT 228 210 221 246 245   Basic Metabolic Panel: Recent Labs  Lab 08/14/23 0443 08/15/23 0338 08/16/23 0433 08/17/23 0434 08/18/23 0423 08/19/23 0425  NA 138 135 138 138 138 136  K 3.4* 3.1* 3.6 4.0 3.9 4.2  CL 105 104 105 106 106 107  CO2 24 22 23 23 22  21*  GLUCOSE 92 99 108* 103* 94 89  BUN 27* 24* 23 24* 24* 25*  CREATININE 1.92* 2.09* 2.08* 2.27* 2.28* 2.44*  CALCIUM 8.1* 8.2* 8.3* 8.4* 8.3* 8.3*  MG 2.1  --   --   --   --   --    Liver Function Tests: Recent Labs  Lab 08/14/23 0443 08/15/23 0338 08/16/23 0433 08/17/23 0434 08/18/23 0423  AST 59* 82* 99* 85* 87*  ALT 34 45* 57* 62* 66*  ALKPHOS 59 59 59 64 65  BILITOT 0.4 0.4 0.4 0.4 0.4  PROT 5.3* 5.3* 5.3* 5.4* 5.3*  ALBUMIN 2.9* 2.9* 3.1* 2.9* 2.7*   CBG: Recent Labs  Lab 08/12/23 1549  GLUCAP 106*    Discharge time spent: greater than 30 minutes.  Signed: Catarina Hartshorn, MD [facility] 08/19/2023

## 2023-08-19 NOTE — Progress Notes (Signed)
 Physical Therapy Treatment Patient Details Name: Caleb Wolf MRN: [id number] DOB: Dec 07, 1940 Today's Date: 08/19/2023   History of Present Illness Caleb Wolf  is a 83 y.o. 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  -Patient is treated with recurrent paracentesis for reaccumulating ascites, most recently this morning, it has been drawn of the abdomen, was cloudy, and results came back with significantly elevated white blood cells, so he was called back and told to come to emergency room room for possible infection, patient reports generalized weakness, fatigue, reports he is compliant with his Lasix, Aldactone and lactulose, reports mild abdominal discomfort, but denies fever, chills.  -Paracentesis performed earlier today showing 1790 white blood cells, Gram stain is negative, labs significant for low potassium at 2.9, creatinine at 2.28 up from 1.9 baseline, but cell count within normal limit, lactic acid elevated at 2.9, patient received IV Rocephin after blood cultures were sent, and [facility] hospitalist consulted to admit.    PT Comments  Pt tolerated today's treatment session, well, tolerated sitting in recliner after PT treatment. Today's session addressed progressing functional transfer capacity,  improving balance with ambulation and less assist. Pt noted with improvement in transfers and ambulation noted from mod assist with transfers to min assist<>CGA dependent upon height. 66ft of ambulation to-date with min assist progressing towards CGA. Pt would continue to benefit from skilled acute physical therapy services in order to progress toward POC goals, safety/independence with functional mobility and QOL.     If plan is discharge home, recommend the following: A little help with bathing/dressing/bathroom;Help with stairs or ramp for entrance;Assistance with cooking/housework;A lot of help with  walking and/or transfers   Can travel by private vehicle     No  Equipment Recommendations  None recommended by PT    Recommendations for Other Services       Precautions / Restrictions Precautions Precautions: Fall Restrictions Weight Bearing Restrictions Per Provider Order: No     Mobility  Bed Mobility Overal bed mobility: Needs Assistance Bed Mobility: Supine to Sit     Supine to sit: Min assist, Contact guard     General bed mobility comments: min assist for trunk elevation with pt assisting with BUE on bedrail. CGA for scooting superior to EOB. Patient Response: Cooperative  Transfers Overall transfer level: Needs assistance Equipment used: Rolling walker (2 wheels) Transfers: Sit to/from Stand Sit to Stand: Contact guard assist           General transfer comment: Performed 3x sit/stands from EOB with elevated bed level. Practiced proper UE/ RW management with assist to power to stand from EOB. Cued for anterior trunk lean. CGA for powering to stand from EOB. 3x sit/stand from recliner after ambulation with HHA from therapist with cues for BUE powerup to stand from recliner. min assist given.    Ambulation/Gait Ambulation/Gait assistance: Contact guard assist Gait Distance (Feet): 26 Feet Assistive device: Rolling walker (2 wheels) Gait Pattern/deviations: Decreased step length - right, Decreased step length - left, Decreased stride length, Steppage, Decreased dorsiflexion - right, Decreased dorsiflexion - left       General Gait Details: CGA for 17ft of ambulation. Consistent verbal and tactile cuing for proper RW management and proper positioning with RW.   Stairs             Wheelchair Mobility     Tilt Bed Tilt Bed Patient Response: Cooperative  Modified Rankin (Stroke  Patients Only)       Balance Overall balance assessment: Needs assistance Sitting-balance support: Feet supported, No upper extremity supported Sitting balance-Leahy  Scale: Good Sitting balance - Comments: good/good seated at EOB   Standing balance support: Reliant on assistive device for balance, During functional activity, Bilateral upper extremity supported                                Communication Communication Communication: No apparent difficulties  Cognition Arousal: Alert Behavior During Therapy: WFL for tasks assessed/performed   PT - Cognitive impairments: No apparent impairments                         Following commands: Intact      Cueing Cueing Techniques: Verbal cues  Exercises General Exercises - Lower Extremity Long Arc Quad: Seated, AROM, Strengthening, Both, 10 reps Toe Raises: Seated, Standing, Strengthening, Both, 10 reps    General Comments        Pertinent Vitals/Pain Pain Assessment Pain Assessment: Faces Faces Pain Scale: Hurts a little bit Pain Location: over buttocks Pain Descriptors / Indicators: Sore Pain Intervention(s): Monitored during session, Repositioned    Home Living                          Prior Function            PT Goals (current goals can now be found in the care plan section) Acute Rehab PT Goals Patient Stated Goal: return home with family to assist PT Goal Formulation: With patient/family Time For Goal Achievement: 08/25/23 Potential to Achieve Goals: Good    Frequency    Min 3X/week      PT Plan      Co-evaluation              AM-PAC PT "6 Clicks" Mobility   Outcome Measure  Help needed turning from your back to your side while in a flat bed without using bedrails?: A Little Help needed moving from lying on your back to sitting on the side of a flat bed without using bedrails?: A Little Help needed moving to and from a bed to a chair (including a wheelchair)?: A Little Help needed standing up from a chair using your arms (e.g., wheelchair or bedside chair)?: A Little Help needed to walk in hospital room?: A Little Help  needed climbing 3-5 steps with a railing? : Total 6 Click Score: 16    End of Session Equipment Utilized During Treatment: Gait belt Activity Tolerance: Patient tolerated treatment well;Patient limited by fatigue Patient left: in chair;with call bell/phone within reach;with family/visitor present Nurse Communication: Mobility status PT Visit Diagnosis: Unsteadiness on feet (R26.81);Other abnormalities of gait and mobility (R26.89);Muscle weakness (generalized) (M62.81)     Time: 6213-0865 PT Time Calculation (min) (ACUTE ONLY): 24 min  Charges:    $Therapeutic Activity: 23-37 mins PT General Charges $$ ACUTE PT VISIT: 1 Visit                     Elie Goody, DPT [facility]- [place] 336 [phone] office   Nelida Meuse 08/19/2023, 10:05 AM

## 2023-08-19 NOTE — Consult Note (Signed)
 [facility] Liaison Note  08/19/2023  Caleb Wolf 10/22/1940 [id number]  Location: RN Hospital Liaison screened the patient remotely at [facility].  Insurance: [employer]   Caleb Wolf is a 83 y.o. male who is a Primary Care Patient of Carylon Perches, MD.The patient was screened for 30 day readmission hospitalization with noted high risk score for unplanned readmission risk with 2 IP in 6 months.  The patient was assessed for potential Care Management service needs for post hospital transition for care coordination. Review of patient's electronic medical record reveals patient was admitted with Spontaneous bacterial peritonitis. Pt recommended for SNF level of care and transitioned to [facility] for ongoing STR. Liaison will collaborate with PAC-RN for post hospital prevention while pt is at the SNF.   Plan: [facility] Liaison will continue to follow progress and disposition to asess for post hospital community care coordination/management needs.  Referral request for community care coordination: Liaison will collaborate with PAC-RN to follow at the affiliated SNF.    [facility] Care Management/Population Health does not replace or interfere with any arrangements made by the Inpatient Transition of Care team.   For questions contact:   [name], RN, BSN Hospital Liaison [facility]   [facility], Population Health Office Hours MTWF  8:00 am-6:00 pm Direct Dial: [phone] mobile [email or web address]

## 2023-08-19 NOTE — Progress Notes (Signed)
 Mobility Specialist Progress Note:    08/19/23 1251  Mobility  Activity Transferred from bed to chair  Level of Assistance Minimal assist, patient does 75% or more  Assistive Device None  Distance Ambulated (ft) 5 ft  Range of Motion/Exercises Active;All extremities  Activity Response Tolerated well  Mobility Referral Yes  Mobility visit 1 Mobility  Mobility Specialist Start Time (ACUTE ONLY) 1250  Mobility Specialist Stop Time (ACUTE ONLY) 1305  Mobility Specialist Time Calculation (min) (ACUTE ONLY) 15 min   Pt received in bed, needing assistance to wc. Required MinA to stand and transer with no AD. Tolerated well, asx throughout. Left pt with RN, all needs met.   Lawerance Bach Mobility Specialist Please contact via Special educational needs teacher or  Rehab office at [phone]

## 2023-08-19 NOTE — Care Management Important Message (Signed)
 Important Message  Patient Details  Name: Caleb Wolf MRN: [id number] Date of Birth: 11-14-1940   Important Message Given:  Yes - Medicare IM     Corey Harold 08/19/2023, 2:09 PM

## 2023-08-19 NOTE — TOC Transition Note (Signed)
 Transition of Care [facility]) - Discharge Note   Patient Details  Name: Caleb Wolf MRN: [id number] Date of Birth: 1941-05-05  Transition of Care [facility]) CM/SW Contact:  Karn Cassis, LCSW Phone Number: 08/19/2023, 2:35 PM   Clinical Narrative:  Pt d/c today to [facility]. Pt's wife and facility aware and agreeable. D/C summary sent to SNF. RN given number to call report. SNF authorization received. Pt will transfer with staff.      Final next level of care: Skilled Nursing Facility Barriers to Discharge: Barriers Resolved   Patient Goals and CMS Choice Patient states their goals for this hospitalization and ongoing recovery are:: return home   Choice offered to / list presented to : Spouse  ownership interest in [facility].provided to::  (n/a)    Discharge Placement              Patient chooses bed at: [facility] Patient to be transferred to facility by: staff Name of family member notified: wife Patient and family notified of of transfer: 08/19/23  Discharge Plan and Services Additional resources added to the After Visit Summary for   In-house Referral: Clinical Social Work   Post Acute Care Choice: Home Health                    HH Arranged: PT [facility] Agency: [employer] [facility] Date [facility] Agency Contacted: 08/13/23 Time HH Agency Contacted: 1116 Representative spoke with at [facility] Agency: Clydie Braun  Social Drivers of Health (SDOH) Interventions SDOH Screenings   Food Insecurity: No Food Insecurity (08/12/2023)  Housing: Low Risk  (08/12/2023)  Transportation Needs: No Transportation Needs (08/12/2023)  Utilities: Not At Risk (08/12/2023)  Depression (PHQ2-9): Low Risk  (01/21/2023)  Social Connections: Socially Integrated (08/12/2023)  Tobacco Use: Medium Risk (08/12/2023)     Readmission Risk Interventions    08/14/2023    9:57 AM 01/16/2023   11:30 AM 12/16/2021   11:51 AM  Readmission Risk Prevention Plan  Transportation  Screening Complete Complete Complete  PCP or Specialist Appt within 5-7 Days   Complete  Home Care Screening   Complete  Medication Review (RN CM)   Complete  HRI or Home Care Consult Complete Complete   Social Work Consult for Recovery Care Planning/Counseling Complete Complete   Palliative Care Screening Not Applicable Not Applicable   Medication Review Oceanographer) Complete Complete

## 2023-08-20 ENCOUNTER — Non-Acute Institutional Stay (SKILLED_NURSING_FACILITY): Payer: Self-pay | Admitting: Internal Medicine

## 2023-08-20 ENCOUNTER — Encounter: Payer: Self-pay | Admitting: Internal Medicine

## 2023-08-20 DIAGNOSIS — I4819 Other persistent atrial fibrillation: Secondary | ICD-10-CM

## 2023-08-20 DIAGNOSIS — R29818 Other symptoms and signs involving the nervous system: Secondary | ICD-10-CM | POA: Diagnosis not present

## 2023-08-20 DIAGNOSIS — N1832 Chronic kidney disease, stage 3b: Secondary | ICD-10-CM

## 2023-08-20 DIAGNOSIS — R4189 Other symptoms and signs involving cognitive functions and awareness: Secondary | ICD-10-CM | POA: Diagnosis not present

## 2023-08-20 DIAGNOSIS — K652 Spontaneous bacterial peritonitis: Secondary | ICD-10-CM

## 2023-08-20 NOTE — Assessment & Plan Note (Addendum)
 He was unable to give me the reason he was hospitalized 3/12 - 08/19/2023 instead confabulating about a fall with head trauma and loss of consciousness.  He had difficulty with word retrieval calling lactulose "Mucinex."  Ammonia level was normal at 29. Reassess mental status post SNF rehab.

## 2023-08-20 NOTE — Assessment & Plan Note (Addendum)
 3/12 - 08/19/2023 AKI in the context of decompensated cirrhosis.  Creatinine peaked at 2.44 with a nadir estimated GFR 26 indicating stage IV CKD.  During hospitalization GFR ranged from a high of 35 to that level of 26. Med list reviewed; spironolactone is not recommended with estimated GFR less than 30 as such patients were not studied in the congestive heart failure trials.  Continue to monitor for ESRD progression and consider discontinuing the spironolactone.

## 2023-08-20 NOTE — Assessment & Plan Note (Addendum)
 Clinically rate is slow & rhythm regular at this time.  Xarelto prophylaxis will continue.

## 2023-08-20 NOTE — Progress Notes (Signed)
 NURSING HOME LOCATION:  [facility] ROOM NUMBER: [id number] P   CODE STATUS:  Full Code  PCP:  Carylon Perches MD  This is a comprehensive admission note to this SNFperformed on this date less than 30 days from date of admission. Included are preadmission medical/surgical history; reconciled medication list; family history; social history and comprehensive review of systems.  Corrections and additions to the records were documented. Comprehensive physical exam was also performed. Additionally a clinical summary was entered for each active diagnosis pertinent to this admission in the Problem List to enhance continuity of care.  HPI: He was hospitalized 3/12 - 08/19/2023 admitted with decompensated cirrhosis with possible recurrent SBP.  He undergoes recurrent paracentesis for ascites reaccumulation.  The morning of admission the paracentesis fluid was noted to be cloudy with significant elevated white blood count.  He was referred to the ED for admission.  He did report generalized weakness and fatigue.  He had mild abdominal discomfort but denied fever or chills. Paracentesis fluid revealed 1790 white blood cells; Gram stain was negative.  Potassium was low at 2.9.  Creatinine had risen to 2.28 up from baseline. Lactic acid level was elevated to 2.9.  Blood cultures were collected and he was started on IV Rocephin. Blood and peritoneal cultures were negative.  IV Rocephin was transitioned to Zosyn 3/15 - 3/18.  GI was consulted "curbside"; it was recommended that he resume his daily Bactrim DS for SBP prevention.  This was to be continued indefinitely.  GI recommended decreasing the dose of Lasix to one half of the preadmission dose. Periumbilical cellulitis was documented and vancomycin was added to Zosyn.  This combination was discontinued due to severe diarrhea and Bactrim DS reinitiated.  Because of the diarrhea lactulose was held 3/18 but subsequently restarted 3/19. PT/INR was 3; this  was in the context of Xarelto anticoagulant  therapy. He has a history of ablation for atrial fib in 2019 and 2022.  He had recurrent A-fib & subsequently underwent DCCV in January 2023.  He has been on maintenance amiodarone 200 mg daily and rivaroxaban was restarted; it had been initially held prior to surgery consultation. Creatinine peaked at 2.44 with a nadir estimated GFR of 26.  While hospitalized GFR ranged from the low of 26 up to a high of 35.  Protein/caloric malnutrition was documented with a 2.7 and total protein of 5.3.  His anemia remained relatively stable with final values of 8.9/28.8 with normochromic, normocytic indices.  White count remained normal while an IP. In context of cirrhosis his AST ranged from 59 up to high of 99; final value was 87. ALT ranged from 34 up to final value of 66. Alkaline phosphatase & total bilirubin were normal.Ammonia level was 29. He was deemed stable enough to be discharged to SNF for rehab.  Past medical and surgical history: Includes history of abdominal aortic aneurysm, history of basal cell carcinoma, history of bronchiolitis obliterans with organizing pneumonia, nonalcoholic cirrhosis, CKD stage IIIb, CAD with history of MI, prostatism, essential hypertension, GERD, history of gout, history of colon polyps, history of nephrolithiasis, dyslipidemia, history of congestive heart failure, OSA, and history of persistent atrial fibrillation. Surgeries and procedures include cardiac ablation, cardiac catheterization, multiple cardioversions, CABG, EGD, colonoscopies with polypectomy, and lithotripsy.  Family history: reviewed, non contributory due to advanced age.  Social history: Nondrinker; former smoker.   Review of systems: Clinical neurocognitive deficits made validity of responses questionable , compromising ROS completion.  As I entered  the room he was just completing the BCAT MMSE assessment which revealed a score 29 out of 50.  He was unable to  give me the diagnosis for his hospitalization.  He began to confabulate about being hospitalized "when I fell and knocked myself unconscious on February?,  25."  He denies any significant cardiopulmonary or GI symptoms except for diarrhea when he takes his lactulose.  Initially he could not name lactulose instead calling tis medication "Mucinex."  He did mention some chest "aggravation when I fell" pointing to his chest.  He also describes intermittent sharp discomfort around the navel. He states he may have numbness and tingling but he "can not recount."  He states that he stumbles related to spinal stenosis. This is also apparently associated with radiculopathic pain.  He does state that he is compliant with his CPAP.  Constitutional: No fever, significant weight change  Eyes: No redness, discharge, pain, vision change ENT/mouth: No nasal congestion, purulent discharge, earache, change in hearing, sore throat  Cardiovascular: No palpitations, paroxysmal nocturnal dyspnea, claudication, edema  Respiratory: No cough, sputum production, hemoptysis, DOE, significant snoring, apnea Gastrointestinal: No heartburn, dysphagia, nausea /vomiting, rectal bleeding, melena Genitourinary: No dysuria, hematuria, pyuria, incontinence, nocturia Musculoskeletal: No joint stiffness, joint swelling Dermatologic: No rash, pruritus, change in appearance of skin Neurologic: No dizziness, headache, syncope, seizures Psychiatric: No significant anxiety, depression, insomnia, anorexia Endocrine: No change in hair/skin/nails, excessive thirst, excessive hunger, excessive urination  Hematologic/lymphatic: No significant bruising, lymphadenopathy, abnormal bleeding  Physical exam:  Pertinent or positive findings: He appears chronically ill.  His brow was furrowed as he responded to queries as if struggling to answer. He has a IT consultant.  Heart rate is slow and rhythm is regular.  First and second heart sounds are accentuated.   There is a bronchovesicular quality to breath sounds.  Abdomen exhibits central obesity with an umbilical hernia.  Pedal pulses are decreased.  He has isolated ecchymoses over the extremities.  There is interosseous wasting of the hands and limb atrophy.  Despite this strength to opposition is fair and equal.  General appearance: no acute distress, increased work of breathing is present.   Lymphatic: No lymphadenopathy about the head, neck, axilla. Eyes: No conjunctival inflammation or lid edema is present. There is no scleral icterus. Ears:  External ear exam shows no significant lesions or deformities.   Nose:  External nasal examination shows no deformity or inflammation. Nasal mucosa are pink and moist without lesions, exudates Oral exam: Lips and gums are healthy appearing.There is no oropharyngeal erythema or exudate. Neck:  No thyromegaly, masses, tenderness noted.    Heart:  No gallop, murmur, click, rub.  Lungs:  without wheezes, rhonchi, rales, rubs. Abdomen: Bowel sounds are normal.  Abdomen is soft and nontender with no organomegaly, masses. GU: Deferred  Extremities:  No cyanosis, clubbing, edema. Neurologic exam: Balance, Rhomberg, finger to nose testing could not be completed due to clinical state Skin: Warm & dry w/o tenting. No significant lesions or rash.  See clinical summary under each active problem in the Problem List with associated updated therapeutic plan

## 2023-08-20 NOTE — Assessment & Plan Note (Addendum)
 He is presently afebrile.Abdomen is non tender. Maintenance prophylaxis with Bactrim DS to prevent recurrent SBP.

## 2023-08-24 ENCOUNTER — Ambulatory Visit (INDEPENDENT_AMBULATORY_CARE_PROVIDER_SITE_OTHER): Admitting: Gastroenterology

## 2023-08-24 DIAGNOSIS — K652 Spontaneous bacterial peritonitis: Secondary | ICD-10-CM

## 2023-08-24 DIAGNOSIS — K7460 Unspecified cirrhosis of liver: Secondary | ICD-10-CM | POA: Diagnosis not present

## 2023-08-24 DIAGNOSIS — K7290 Hepatic failure, unspecified without coma: Secondary | ICD-10-CM

## 2023-08-24 NOTE — Progress Notes (Addendum)
 Primary Care Physician:  Carylon Perches, MD  Primary GI: Dr. Levon Hedger   Patient Location: Home   Provider Location: [facility]   Reason for Visit: hospital follow up for SBP   Persons present on the virtual encounter, with roles: Caleb Wolf L. Jeanmarie Hubert, MSN, APRN, AGNP-C, Caleb Wolf, patient, Caleb Wolf, patients wife   Total time (minutes) spent on medical discussion: 21 minutes  Virtual Visit via Telephone  visit is conducted virtually and was requested by patient.   I connected with Caleb Wolf on 08/24/23 at 10:15 AM EDT by telephone and verified that I am speaking with the correct person using two identifiers.   I discussed the limitations, risks, security and privacy concerns of performing an evaluation and management service by telephone and the availability of in person appointments. I also discussed with the patient that there may be a patient responsible charge related to this service. The patient expressed understanding and agreed to proceed.  Chief Complaint  Patient presents with   Hospitalization Follow-up    Patient doing a phone visit today for hospital follow up. [facility] and unable to stand to come in office. Wife Caleb Wolf is with patient [phone]. Wife states her friend thought his belly might be getting bigger but wife states she is unable to tell that it is.    History of Present Illness: [name] is a 83 y.o. 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 Recommendations to restart SBP prophylaxis upon discharge with Bactrim daily. Also found to have periumbilical cellulitis which may have been a potential contributing factor for SBP.    Notably patient has history of recurrent SBP, previously maintained on Bactrim as he was taken off of cipro due to concern for QT prolongation in setting of other cardiac comorbidities. He was seen by ID for recurrent SBP though ID did not feel this was true SBP given ongoing negative cultures, his SBP prophylaxis was stopped after his visit with ID In August.   Present:  Patient's wife provides history. States patient is at [facility] center, has been there since Wednesday. She notes his strength is very low, he is unable to stand at this time. She states that his BMs are sporadic as his lactulose is being dosed by [facility]. Patient states he had a BM this morning but unsure of consistency but states he had some incontinence of stools and had to be cleaned up. She feels his mental status has improved over the past few days. He denies abdominal pain. Some soreness to abdomen which seems to be improving.   His wife is concerned about recurrent ascites and getting patient a Paracentesis since [facility] now and she is not able to be as involved in making medical decisions for him as she normally is when he is at home.  She inquires about previous conversation with palliative care at [facility] which patient was not really interested in having those conversations at that time but his wife understands that he may benefit from palliative care involvement give his recent clinical course.    Past Medical History:  Diagnosis Date   AAA (abdominal aortic aneurysm) (HCC)    Arthritis  Basal cell carcinoma    BOOP (bronchiolitis obliterans with organizing pneumonia) (HCC) 2023   Carotid stenosis    Chronic kidney disease, stage 3b (HCC) 12/13/2021   Cirrhosis of liver not due to alcohol (HCC)    Coronary artery disease    Multvessel s/p CABG 2015   Enlarged prostate    Essential hypertension    GERD (gastroesophageal reflux disease)    Gout    History of colon polyps    History of hiatal  hernia    History of kidney stones    Hyperlipidemia    Leukocytosis 01/21/2023   Lumbar disc disease    Myocardial infarction (HCC) 2015   Oxygen dependent    at bedtime- 2L   Persistent atrial fibrillation (HCC)    8 cardioversions and 2 ablations   Sleep apnea    CPAP   Spinal stenosis      Past Surgical History:  Procedure Laterality Date   ABLATION OF DYSRHYTHMIC FOCUS  07/28/2017   ATRIAL FIBRILLATION ABLATION N/A 07/28/2017   Procedure: ATRIAL FIBRILLATION ABLATION;  Surgeon: Hillis Range, MD;  Location: [facility] INVASIVE CV LAB;  Service: Cardiovascular;  Laterality: N/A;   ATRIAL FIBRILLATION ABLATION N/A 12/04/2020   Procedure: ATRIAL FIBRILLATION ABLATION;  Surgeon: Hillis Range, MD;  Location: [facility] INVASIVE CV LAB;  Service: Cardiovascular;  Laterality: N/A;   BACK SURGERY     CARDIAC CATHETERIZATION  05/22/2014   Procedure: IABP INSERTION;  Surgeon: Marykay Lex, MD;  Location: [facility] CATH LAB;  Service: Cardiovascular;;   CARDIOVERSION N/A 04/29/2017   Procedure: CARDIOVERSION;  Surgeon: Jonelle Sidle, MD;  Location: [facility] ENDO SUITE;  Service: Cardiovascular;  Laterality: N/A;   CARDIOVERSION N/A 08/13/2017   Procedure: CARDIOVERSION;  Surgeon: Chrystie Nose, MD;  Location: [facility] ENDOSCOPY;  Service: Cardiovascular;  Laterality: N/A;   CARDIOVERSION N/A 08/15/2019   Procedure: CARDIOVERSION;  Surgeon: Lars Masson, MD;  Location: [facility] ENDOSCOPY;  Service: Cardiovascular;  Laterality: N/A;   CARDIOVERSION N/A 03/09/2020   Procedure: CARDIOVERSION;  Surgeon: Jodelle Red, MD;  Location: [facility] ENDOSCOPY;  Service: Cardiovascular;  Laterality: N/A;   CARDIOVERSION N/A 07/06/2020   Procedure: CARDIOVERSION;  Surgeon: Thurmon Fair, MD;  Location: [facility] ENDOSCOPY;  Service: Cardiovascular;  Laterality: N/A;   CARDIOVERSION N/A 01/18/2021   Procedure: CARDIOVERSION;  Surgeon: Chilton Si, MD;  Location: [facility] ENDOSCOPY;  Service: Cardiovascular;  Laterality: N/A;    CARDIOVERSION N/A 06/17/2021   Procedure: CARDIOVERSION;  Surgeon: Sande Rives, MD;  Location: [facility] ENDOSCOPY;  Service: Cardiovascular;  Laterality: N/A;   Cataract surgery Bilateral    COLONOSCOPY     COLONOSCOPY N/A 12/30/2017   Procedure: COLONOSCOPY;  Surgeon: Malissa Hippo, MD;  [facility]: [facility] ENDO [facility];  Service: Endoscopy;  Laterality: N/A;   CORONARY ARTERY BYPASS GRAFT N/A 05/22/2014   Procedure: CORONARY ARTERY BYPASS GRAFTING (CABG) times three using left internal mammary and right saphenous vein.;  Surgeon: Loreli Slot, MD;  Location: [facility] OR;  Service: Open Heart Surgery;  Laterality: N/A;   ENDARTERECTOMY Left 02/17/2014   Procedure: ENDARTERECTOMY CAROTID WITH PATCH ANGIOPLASTY;  Surgeon: Pryor Ochoa, MD;  Location: [facility] OR;  Service: Vascular;  Laterality: Left;   ESOPHAGEAL DILATION N/A 08/22/2015   Procedure: ESOPHAGEAL DILATION;  Surgeon: Malissa Hippo, MD;  [facility] ENDO [facility];  Service: Endoscopy;  Laterality: N/A;   ESOPHAGOGASTRODUODENOSCOPY N/A 08/22/2015   Procedure: ESOPHAGOGASTRODUODENOSCOPY (EGD);  Surgeon: Malissa Hippo, MD;  Location: [facility] ENDO SU[facility];  Service: Endoscopy;  Laterality: N/A;  12:45 - moved to 1:55 - Ann notified pt   ESOPHAGOGASTRODUODENOSCOPY N/A 12/30/2017   Procedure: ESOPHAGOGASTRODUODENOSCOPY (EGD);  Surgeon: Malissa Hippo, MD;  [facility]: [facility] END[facility];  Service: Endoscopy;  Laterality: N/A;  200   EYE SURGERY     cataract extraction (right) with repair macular tear , with IOL     right   FRACTURE SURGERY     bilateral wrist fractures- one ORIF   IR RADIOLOGIST EVAL & MGMT  12/01/2022   IR RADIOLOGIST EVAL & MGMT  06/25/2023   IR TIPS  07/13/2023   IR US GUIDE VASC ACCESS RIGHT  07/13/2023   JOINT REPLACEMENT  2011   left knee   LEFT HEART CATHETERIZATION WITH CORONARY ANGIOGRAM N/A 05/22/2014   Procedure: LEFT HEART CATHETERIZATION WITH CORONARY ANGIOGRAM;  Surgeon: Marykay Lex, MD;  Location: [facility] CATH LAB;   Service: Cardiovascular;  Laterality: N/A;   LITHOTRIPSY     PARS PLANA VITRECTOMY W/ REPAIR OF MACULAR HOLE     RHINOPLASTY     TEE WITHOUT CARDIOVERSION N/A 04/29/2017   Procedure: TRANSESOPHAGEAL ECHOCARDIOGRAM (TEE) WITH PROPOFOL;  Surgeon: Jonelle Sidle, MD;  Location: [facility] ENDO SUITE;  Service: Cardiovascular;  Laterality: N/A;   TIPS PROCEDURE N/A 07/13/2023   Procedure: TRANS-JUGULAR INTRAHEPATIC PORTAL SHUNT (TIPS);  Surgeon: Roanna Banning, MD;  Location: [facility] OR;  Service: Radiology;  Laterality: N/A;   TONSILLECTOMY     TOTAL HIP ARTHROPLASTY  12/08/2011   Procedure: TOTAL HIP ARTHROPLASTY;  Surgeon: Loanne Drilling, MD;  Location: [facility] ORS;  Service: Orthopedics;  Laterality: Right;   UPPER GI ENDOSCOPY  12/18/2015   Procedure: UPPER GI ENDOSCOPY;  Surgeon: Axel Filler, MD;  Location: [facility] ORS;  Service: General;;   WRIST SURGERY Right 69yrs ago   WRIST SURGERY Left     Current Meds  Medication Sig   acetaminophen (TYLENOL) 325 MG tablet Take 650 mg by mouth every 6 (six) hours as needed.   amiodarone (PACERONE) 200 MG tablet TAKE 1 TABLET BY MOUTH EVERY DAY   atorvastatin (LIPITOR) 80 MG tablet TAKE 1 TABLET BY MOUTH EVERY DAY IN THE EVENING   Cholecalciferol (VITAMIN D) 50 MCG (2000 UT) CAPS Take 2,000 Units by mouth daily.   cyanocobalamin (VITAMIN B12) 1000 MCG tablet Take 1,000 mcg by mouth 2 (two) times a week. Tuesday and Friday   doxazosin (CARDURA) 4 MG tablet Take 4 mg by mouth in the morning.   empagliflozin (JARDIANCE) 10 MG TABS tablet TAKE 1 TABLET BY MOUTH DAILY BEFORE BREAKFAST.   finasteride (PROSCAR) 5 MG tablet Take 5 mg by mouth every evening.    fluticasone (FLONASE) 50 MCG/ACT nasal spray Place 1 spray into both nostrils daily as needed for allergies or rhinitis.   furosemide (LASIX) 40 MG tablet Take 1 tablet (40 mg total) by mouth daily in the afternoon.   furosemide (LASIX) 80 MG tablet Take 1 tablet (80 mg total) by mouth daily.   lactulose  (CHRONULAC) 10 GM/15ML solution Take 67.5 mLs (45 g total) by mouth 3 (three) times daily. Titrate dosage of lactulose to 2-3 soft bowels per day (Patient taking differently: Take 40 g by mouth 2 (two) times daily.)   nitroGLYCERIN (NITROSTAT) 0.4 MG SL tablet Place 1 tablet (0.4 mg total) under the tongue every 5 (five) minutes x 3 doses as needed for chest pain.   omeprazole (PRILOSEC) 40 MG capsule Take 40 mg by mouth daily.   Probiotic Product (PROBIOTIC DAILY PO) Take by mouth.  S. Boulardii saccharomyces boulardii 250mg  capsule twice a day   Rivaroxaban (XARELTO) 15 MG TABS tablet Take 1 tablet (15 mg total) by mouth daily with supper.   sertraline (ZOLOFT) 25 MG tablet Take 1 tablet (25 mg total) by mouth at bedtime.   spironolactone (ALDACTONE) 25 MG tablet TAKE 1 TABLET (25 MG TOTAL) BY MOUTH DAILY.   sulfamethoxazole-trimethoprim (BACTRIM DS) 800-160 MG tablet Take 1 tablet by mouth daily.   tiZANidine (ZANAFLEX) 2 MG tablet Take 2 mg by mouth 2 (two) times daily as needed for muscle spasms.     Family History  Problem Relation Age of Onset   Allergies Mother    Heart disease Mother    Hypertension Mother    Heart disease Father        MI    Social History   Socioeconomic History   Marital status: Married    Spouse name: Not on file   Number of children: Not on file   Years of education: Not on file   Highest education level: Not on file  Occupational History   Occupation: retired    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  Tobacco Use   Smoking status: Former    Current packs/day: 0.00    Average packs/day: 1.5 packs/day for 25.0 years (37.5 ttl pk-yrs)    Types: Cigarettes    Start date: 08/08/1960    Quit date: 06/02/1982    Years since quitting: 41.2    Passive exposure: Never   Smokeless tobacco: Never   Tobacco comments:    quit smoking 30+yrs ago  Vaping Use   Vaping status: Never Used  Substance and Sexual Activity   Alcohol use: No    Alcohol/week: 0.0 standard  drinks of alcohol    Comment: occasionally    Drug use: No   Sexual activity: Yes  Other Topics Concern   Not on file  Social History Narrative   Not on file   Social Drivers of Health   Financial Resource Strain: Not on file  Food Insecurity: No Food Insecurity (08/12/2023)   Hunger Vital Sign    Worried About Running Out of Food in the Last Year: Never true    Ran Out of Food in the Last Year: Never true  Transportation Needs: No Transportation Needs (08/12/2023)   PRAPARE - Administrator, Civil Service (Medical): No    Lack of Transportation (Non-Medical): No  Physical Activity: Not on file  Stress: Not on file  Social Connections: Socially Integrated (08/12/2023)   Social Connection and Isolation Panel [NHANES]    Frequency of Communication with Friends and Family: More than three times a week    Frequency of Social Gatherings with Friends and Family: More than three times a week    Attends Religious Services: More than 4 times per year    Active Member of [employer] or Organizations: Yes    Attends Banker Meetings: Never    Marital Status: Married    Review of Systems: Gen: Denies fever, chills, anorexia. Denies fatigue, weakness, weight loss.  CV: Denies chest pain, palpitations, syncope, peripheral edema, and claudication. Resp: Denies dyspnea at rest, cough, wheezing, coughing up blood, and pleurisy. GI: see HPI Derm: Denies rash, itching, dry skin Psych: Denies depression, anxiety, memory loss, confusion. No homicidal or suicidal ideation.  Heme: Denies bruising, bleeding, and enlarged lymph nodes.  Observations/Objective: No distress. Unable to perform physical exam due to telephone encounter. No video available.   Assessment and Plan:  VITO BEG is a 83 y.o. 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  Multiple hospitalizations for concern for SBP, previously asymptomatic though most recent with abdominal discomfort, also found to have periumbilical cellulitis, which may have contributed. Multiple prior cultures with no growth and no identifiable organism.Culture during most recent admission with no growth.   on vancomycin in the past after receiving IV Rocephin and then switched to Cipro for SBP prophylaxis outpatient however given concern for QT prolongation he was switched to Bactrim. Seen by ID in August 2021 which patient and family report that given no growth on cultures from prior episodes of possible SBP that he could discontinue Bactrim although not stated in their note, Dr. Luciana Axe with ID stated this current episode appears to be true SBP and would recommend ongoing prophylaxis post hospitalization which he will need to continue on indefinitely.  He is up to date on MELD labs with last MELD 3.0 on 3/18 being 18, also up to date on [facility] screening, unfortunately he is not a candidate for transplant.   [facility]. Has been very weak and unable to stand recently per his wife. His mental status seems to be improving as is abdominal pain. His wife who is very involved in his care states palliative care saw them during admission, while patient was not ready to transition to palliative, she understands this may be a good option in the near future in order to preserve quality over quantity of his life, given his recently complicated clinical course with decompensation of his cirrhosis.   -RUQ [place] August  -Repeat MELD labs 3 months -continue bactrim daily -monitor for reoccurring ascites, Paracentesis PRN, 4L max with albumin per protocol, body fluid cell count, gram stain, body fluid culture and cytology to be done on the ascitic fluid.  -continue lactulose, titrate to 2-3 soft BMs per day  -consider palliative care involvement -continue  spironolactone and lasix  -if mental status not improving with lactulose alone, will need to add xifaxan  Follow Up Instructions: 2 months    I discussed the assessment and treatment plan with the patient. The patient was provided an opportunity to ask questions and all were answered. The patient agreed with the plan and demonstrated an understanding of the instructions.   The patient was advised to call back or seek an in-person evaluation if the symptoms worsen or if the condition fails to improve as anticipated.  I provided 21 minutes of NON face-to-face time during this telephone encounter.  Joelee Snoke L. Jeanmarie Hubert, MSN, APRN, AGNP-C Adult-Gerontology Nurse Practitioner [facility]  I have reviewed the note and agree with the APP's assessment as described in this progress note  Given previous increase in creatinine with Bactrim, we will recheck BMP now.  Katrinka Blazing, MD Gastroenterology and Hepatology [facility]

## 2023-08-24 NOTE — Patient Instructions (Signed)
 Continue bactrim daily  Continue lactulose, titrate to 2-3 soft BMs per day Monitor mental status for changes that could indicate encephalopathy Need to monitor closely for swelling in abdomen as patient has required paracentesis usually every 3-4 weeks in the past Would consider palliative involvement as well  Follow up 2 months

## 2023-08-25 ENCOUNTER — Telehealth: Payer: Self-pay

## 2023-08-25 NOTE — Progress Notes (Signed)
   08/25/2023  Patient ID: Caleb Wolf, male   DOB: 03-11-1941, 83 y.o.   MRN: [id number]   Patient appeared on insurance report for not passing the quality metrics in 2024:  Medication Adherence for Hypertension [facility])   Outreach to the patient was not needed today.  Patient is no longer on lisinopril, per cardiology no ARNI either due to elevated creatinine.   Has adequate supply of atorvastatin and Jardiance, adherent to both, no need for outreach or intervention.  Fayette Pho, PharmD

## 2023-08-26 ENCOUNTER — Inpatient Hospital Stay
Admission: RE | Admit: 2023-08-26 | Discharge: 2023-08-26 | Discharge status: Home / Self Care | Source: Ambulatory Visit | Attending: Interventional Radiology

## 2023-08-26 DIAGNOSIS — K7290 Hepatic failure, unspecified without coma: Secondary | ICD-10-CM

## 2023-08-26 HISTORY — PX: IR RADIOLOGIST EVAL & MGMT: IMG5224

## 2023-08-26 NOTE — Progress Notes (Signed)
 Reason for visit: Cirrhosis. Post op TIPS  Care Team: Primary Care; Carylon Perches, MD Gastroenterology;  Dolores Frame, MD and Raquel James, NP Pulmonary; Waymon Budge, MD  Cardiology; Jake Bathe, MD    Virtual Visit via Video Conferencing  I connected with [name]  on 08/26/23 by video-telephonic conference and verified that I am speaking with the correct person using two identifiers. I discussed the limitations, risks, security and privacy concerns of performing an evaluation and management service by tele-visit and the availability of in-person appointments.  History of present illness:  [name] is a 83 y.o. male comorbid including a PMHx significant for Afib s/p ablation x2, CAD s/p CABG and CHF w EF ~45%. Pt reported remote EtOH Hx, quit 20 yrs prior however noted increasing abdominal distention with ascitic decompensation in 11/2022. Pt is followed closely by his Gastroenterology ([facility]) team in [place] and was initially referred for TIPS then seen by me on 12/01/22. He unfortunately had inpatient hospitalizations x2 for SBP workup and fall, with acute worsening of his liver function and deferring his TIPS. His ascites has been managed by serial paracenteses. I saw him in re-evaluation for TIPS on 06/25/23 and he appeared improved enough to tolerate procedure.  He underwent ICE-guided TIPS without paracentesis on 07/13/23. Procedure was uneventful and Pt was discharged on POD 1 to rehabilitation care facility in [place]. He had standing order for US Paracentesis on 08/12/23 w 4L drawn however he was recommended to present to ER with ascites revealing SBP. He was recently discharged on 08/19/23 after a week admitted. He is joined in the visit by his spouse, [name], who is a reliable historian and reports that [name] has intermittent confusion and that his encephalopathy is well regulated by his lactulose w 3 BM/day. He is reportedly slowly  regaining his strength with his ambulating being a challenge. He has lower extremity swelling and currently ambulates with a walker.    Review of Systems: A 12-point ROS discussed, and pertinent positives are indicated in the HPI above.  All other systems are negative   Past Medical History:  Diagnosis Date   AAA (abdominal aortic aneurysm) (HCC)    Arthritis    Basal cell carcinoma    BOOP (bronchiolitis obliterans with organizing pneumonia) (HCC) 2023   Carotid stenosis    Chronic kidney disease, stage 3b (HCC) 12/13/2021   Cirrhosis of liver not due to alcohol (HCC)    Coronary artery disease    Multvessel s/p CABG 2015   Enlarged prostate    Essential hypertension    GERD (gastroesophageal reflux disease)    Gout    History of colon polyps    History of hiatal hernia    History of kidney stones    Hyperlipidemia    Leukocytosis 01/21/2023   Lumbar disc disease    Myocardial infarction (HCC) 2015   Oxygen dependent    at bedtime- 2L   Persistent atrial fibrillation (HCC)    8 cardioversions and 2 ablations   Sleep apnea    CPAP   Spinal stenosis     Past Surgical History:  Procedure Laterality Date   ABLATION OF DYSRHYTHMIC FOCUS  07/28/2017   ATRIAL FIBRILLATION ABLATION N/A 07/28/2017   Procedure: ATRIAL FIBRILLATION ABLATION;  Surgeon: Hillis Range, MD;  Location: [facility] INVASIVE CV LAB;  Service: Cardiovascular;  Laterality: N/A;   ATRIAL FIBRILLATION ABLATION N/A 12/04/2020   Procedure: ATRIAL FIBRILLATION ABLATION;  Surgeon:  Hillis Range, MD;  Location: [facility]VAS[facility] CV LAB;  Service: Cardiovascular;  Laterality: N/A;   BACK SURGERY     CARDIAC CATHETERIZATION  05/22/2014   Procedure: IABP INSERTION;  Surgeon: Marykay Lex, MD;  Location: [facility] CATH LAB;  Service: Cardiovascular;;   CARDIOVERSION N/A 04/29/2017   Procedure: CARDIOVERSION;  Surgeon: Jonelle Sidle, MD;  Location: [facility] ENDO SUITE;  Service: Cardiovascular;  Laterality: N/A;   CARDIOVERSION N/A  08/13/2017   Procedure: CARDIOVERSION;  Surgeon: Chrystie Nose, MD;  Location: [facility] ENDOSCOPY;  Service: Cardiovascular;  Laterality: N/A;   CARDIOVERSION N/A 08/15/2019   Procedure: CARDIOVERSION;  Surgeon: Lars Masson, MD;  Location: [facility] ENDOSCOPY;  Service: Cardiovascular;  Laterality: N/A;   CARDIOVERSION N/A 03/09/2020   Procedure: CARDIOVERSION;  Surgeon: Jodelle Red, MD;  Location: [facility] ENDOSCOPY;  Service: Cardiovascular;  Laterality: N/A;   CARDIOVERSION N/A 07/06/2020   Procedure: CARDIOVERSION;  Surgeon: Thurmon Fair, MD;  Location: [facility] ENDOSCOPY;  Service: Cardiovascular;  Laterality: N/A;   CARDIOVERSION N/A 01/18/2021   Procedure: CARDIOVERSION;  Surgeon: Chilton Si, MD;  Location: [facility] ENDOSCOPY;  Service: Cardiovascular;  Laterality: N/A;   CARDIOVERSION N/A 06/17/2021   Procedure: CARDIOVERSION;  Surgeon: Sande Rives, MD;  Location: [facility] ENDOSCOPY;  Service: Cardiovascular;  Laterality: N/A;   Cataract surgery Bilateral    COLONOSCOPY     COLONOSCOPY N/A 12/30/2017   Procedure: COLONOSCOPY;  Surgeon: Malissa Hippo, MD;  [facility];  Service: Endoscopy;  Laterality: N/A;   CORONARY ARTERY BYPASS GRAFT N/A 05/22/2014   Procedure: CORONARY ARTERY BYPASS GRAFTING (CABG) times three using left internal mammary and right saphenous vein.;  Surgeon: Loreli Slot, MD;  Location[facility] OR;  Service: Open Heart Surgery;  Laterality: N/A;   ENDARTERECTOMY Left 02/17/2014   Procedure: ENDARTERECTOMY CAROTID WITH PATCH ANGIOPLASTY;  Surgeon: Pryor Ochoa, MD;  Location: [facility] OR;  Service: Vascular;  Laterality: Left;   ESOPHAGEAL DILATION N/A 08/22/2015   Procedure: ESOPHAGEAL DILATION;  Surgeon: Malissa Hippo, MD;  [facility]O [facility];  Service: Endoscopy;  Laterality: N/A;   ESOPHAGOGASTRODUODENOSCOPY N/A 08/22/2015   Procedure: ESOPHAGOGASTRODUODENOSCOPY (EGD);  Surgeon: Malissa Hippo, MD;  [facility];  Service:  Endoscopy;  Laterality: N/A;  12:45 - moved to 1:55 - Ann notified pt   ESOPHAGOGASTRODUODENOSCOPY N/A 12/30/2017   Procedure: ESOPHAGOGASTRODUODENOSCOPY (EGD);  Surgeon: Malissa Hippo, MD;  [facility];  Service: Endoscopy;  Laterality: N/A;  200   EYE SURGERY     cataract extraction (right) with repair macular tear , with IOL     right   FRACTURE SURGERY     bilateral wrist fractures- one ORIF   IR RADIOLOGIST EVAL & MGMT  12/01/2022   IR RADIOLOGIST EVAL & MGMT  06/25/2023   IR RADIOLOGIST EVAL & MGMT  08/26/2023   IR TIPS  07/13/2023   IR US GUIDE VASC ACCESS RIGHT  07/13/2023   JOINT REPLACEMENT  2011   left knee   LEFT HEART CATHETERIZATION WITH CORONARY ANGIOGRAM N/A 05/22/2014   Procedure: LEFT HEART CATHETERIZATION WITH CORONARY ANGIOGRAM;  Surgeon: Marykay Lex, MD;  Location: [facility] CATH LAB;  Service: Cardiovascular;  Laterality: N/A;   LITHOTRIPSY     PARS PLANA VITRECTOMY W/ REPAIR OF MACULAR HOLE     RHINOPLASTY     TEE WITHOUT CARDIOVERSION N/A 04/29/2017   Procedure: TRANSESOPHAGEAL ECHOCARDIOGRAM (TEE) WITH PROPOFOL;  Surgeon: Jonelle Sidle, MD;  [facility];  Service:  Cardiovascular;  Laterality: N/A;   TIPS PROCEDURE N/A 07/13/2023   Procedure: TRANS-JUGULAR INTRAHEPATIC PORTAL SHUNT (TIPS);  Surgeon: Roanna Banning, MD;  Location: [facility] OR;  Service: Radiology;  Laterality: N/A;   TONSILLECTOMY     TOTAL HIP ARTHROPLASTY  12/08/2011   Procedure: TOTAL HIP ARTHROPLASTY;  Surgeon: Loanne Drilling, MD;  Location: [facility] ORS;  Service: Orthopedics;  Laterality: Right;   UPPER GI ENDOSCOPY  12/18/2015   Procedure: UPPER GI ENDOSCOPY;  Surgeon: Axel Filler, MD;  Location: [facility] ORS;  Service: General;;   WRIST SURGERY Right 5yrs ago   WRIST SURGERY Left     Allergies: Codeine sulfate  Medications: Prior to Admission medications   Medication Sig Start Date End Date Taking? Authorizing Provider  acetaminophen (TYLENOL) 325 MG tablet Take 650 mg  by mouth every 6 (six) hours as needed.    [provider]  amiodarone (PACERONE) 200 MG tablet TAKE 1 TABLET BY MOUTH EVERY DAY 04/17/23   Jake Bathe, MD  atorvastatin (LIPITOR) 80 MG tablet TAKE 1 TABLET BY MOUTH EVERY DAY IN THE EVENING 04/17/23   Jake Bathe, MD  Cholecalciferol (VITAMIN D) 50 MCG (2000 UT) CAPS Take 2,000 Units by mouth daily.    [provider]  cyanocobalamin (VITAMIN B12) 1000 MCG tablet Take 1,000 mcg by mouth 2 (two) times a week. Tuesday and Friday    [provider]  doxazosin (CARDURA) 4 MG tablet Take 4 mg by mouth in the morning. 07/06/17   [provider]  empagliflozin (JARDIANCE) 10 MG TABS tablet TAKE 1 TABLET BY MOUTH DAILY BEFORE BREAKFAST. 06/15/23   Jake Bathe, MD  feeding supplement (BOOST HIGH PROTEIN) LIQD Take 1 Container by mouth daily in the afternoon.    [provider]  finasteride (PROSCAR) 5 MG tablet Take 5 mg by mouth every evening.     [provider]  fluticasone (FLONASE) 50 MCG/ACT nasal spray Place 1 spray into both nostrils daily as needed for allergies or rhinitis.    [provider]  furosemide (LASIX) 40 MG tablet Take 1 tablet (40 mg total) by mouth daily in the afternoon. 08/19/23   Catarina Hartshorn, MD  furosemide (LASIX) 80 MG tablet Take 1 tablet (80 mg total) by mouth daily. 08/20/23   Catarina Hartshorn, MD  lactulose (CHRONULAC) 10 GM/15ML solution Take 67.5 mLs (45 g total) by mouth 3 (three) times daily. Titrate dosage of lactulose to 2-3 soft bowels per day Patient taking differently: Take 40 g by mouth 2 (two) times daily. 03/17/23   Carlan, Chelsea L, NP  nitroGLYCERIN (NITROSTAT) 0.4 MG SL tablet Place 1 tablet (0.4 mg total) under the tongue every 5 (five) minutes x 3 doses as needed for chest pain. 06/04/20   Allred, Fayrene Fearing, MD  omeprazole (PRILOSEC) 40 MG capsule Take 40 mg by mouth daily.    [provider]  pantoprazole (PROTONIX) 40 MG tablet Take 1 tablet (40  mg total) by mouth daily before breakfast. Patient not taking: Reported on 08/24/2023 02/19/16   Malissa Hippo, MD  Probiotic Product (PROBIOTIC DAILY PO) Take by mouth. S. Boulardii saccharomyces boulardii 250mg  capsule twice a day    [provider]  Rivaroxaban (XARELTO) 15 MG TABS tablet Take 1 tablet (15 mg total) by mouth daily with supper. 12/16/21   Catarina Hartshorn, MD  sertraline (ZOLOFT) 25 MG tablet Take 1 tablet (25 mg total) by mouth at bedtime. 08/19/23   Catarina Hartshorn, MD  spironolactone (ALDACTONE)  25 MG tablet TAKE 1 TABLET (25 MG TOTAL) BY MOUTH DAILY. 05/05/23   Carlan, Chelsea L, NP  sulfamethoxazole-trimethoprim (BACTRIM DS) 800-160 MG tablet Take 1 tablet by mouth daily. 08/20/23   Catarina Hartshorn, MD  tiZANidine (ZANAFLEX) 2 MG tablet Take 2 mg by mouth 2 (two) times daily as needed for muscle spasms. 07/04/23   [provider]     Family History  Problem Relation Age of Onset   Allergies Mother    Heart disease Mother    Hypertension Mother    Heart disease Father        MI    Social History   Socioeconomic History   Marital status: Married    Spouse name: Not on file   Number of children: Not on file   Years of education: Not on file   Highest education level: Not on file  Occupational History   Occupation: retired    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  Tobacco Use   Smoking status: Former    Current packs/day: 0.00    Average packs/day: 1.5 packs/day for 25.0 years (37.5 ttl pk-yrs)    Types: Cigarettes    Start date: 08/08/1960    Quit date: 06/02/1982    Years since quitting: 41.2    Passive exposure: Never   Smokeless tobacco: Never   Tobacco comments:    quit smoking 30+yrs ago  Vaping Use   Vaping status: Never Used  Substance and Sexual Activity   Alcohol use: No    Alcohol/week: 0.0 standard drinks of alcohol    Comment: occasionally    Drug use: No   Sexual activity: Yes  Other Topics Concern   Not on file  Social History Narrative    Not on file   Social Drivers of Health   Financial Resource Strain: Not on file  Food Insecurity: No Food Insecurity (08/12/2023)   Hunger Vital Sign    Worried About Running Out of Food in the Last Year: Never true    Ran Out of Food in the Last Year: Never true  Transportation Needs: No Transportation Needs (08/12/2023)   PRAPARE - Administrator, Civil Service (Medical): No    Lack of Transportation (Non-Medical): No  Physical Activity: Not on file  Stress: Not on file  Social Connections: Socially Integrated (08/12/2023)   Social Connection and Isolation Panel [NHANES]    Frequency of Communication with Friends and Family: More than three times a week    Frequency of Social Gatherings with Friends and Family: More than three times a week    Attends Religious Services: More than 4 times per year    Active Member of [employer] or Organizations: Yes    Attends Banker Meetings: Never    Marital Status: Married     Vital Signs: There were no vitals taken for this visit.  Physical Exam Deferred secondary to virtual visit.  Imaging:  TIPS, 07/13/23 Independently reviewed, demonstrating well positioned TIPS stent. Small volume of ascites on subsequent CT [facility] (08/13/23)    No results found.  Labs:  CBC: Recent Labs    08/16/23 0433 08/17/23 0434 08/18/23 0423 08/19/23 0425  WBC 6.8 7.6 8.0 7.4  HGB 8.6* 9.0* 8.9* 8.9*  HCT 28.0* 28.9* 28.7* 28.8*  PLT 210 221 246 245    COAGS: Recent Labs    07/13/23 0648 08/14/23 0443 08/15/23 0338 08/17/23 0434  INR 1.1 3.3* 3.0* 1.3*    BMP: Recent Labs  08/16/23 0433 08/17/23 0434 08/18/23 0423 08/19/23 0425  NA 138 138 138 136  K 3.6 4.0 3.9 4.2  CL 105 106 106 107  CO2 23 23 22  21*  GLUCOSE 108* 103* 94 89  BUN 23 24* 24* 25*  CALCIUM 8.3* 8.4* 8.3* 8.3*  CREATININE 2.08* 2.27* 2.28* 2.44*  GFRNONAA 31* 28* 28* 26*    LIVER FUNCTION TESTS: Recent Labs    08/15/23 0338  08/16/23 0433 08/17/23 0434 08/18/23 0423  BILITOT 0.4 0.4 0.4 0.4  AST 82* 99* 85* 87*  ALT 45* 57* 62* 66*  ALKPHOS 59 59 64 65  PROT 5.3* 5.3* 5.4* 5.3*  ALBUMIN 2.9* 3.1* 2.9* 2.7*   MELD 3.0: 19 at 08/19/2023  4:25 AM MELD-Na: 19 at 08/19/2023  4:25 AM Calculated from: Serum Creatinine: 2.44 mg/dL at 1/61/0960  4:54 AM Serum Sodium: 136 mmol/L at 08/19/2023  4:25 AM Total Bilirubin: 0.4 mg/dL (Using min of 1 mg/dL) at 0/98/1191  4:78 AM Serum Albumin: 2.7 g/dL at 2/95/6213  0:86 AM INR(ratio): 1.3 at 08/17/2023  4:34 AM Age at listing (hypothetical): 49 years Sex: Male at 08/19/2023  4:25 AM   Assessment and Plan:  83 y/o M comorbid w PMHx significant for CAD s/p CABG, CHF w EF ~45% and Afib s/p ablation x2. Remote EtOH Hx, quit 20 yrs prior, decompensated with refractory ascites requring serial paracenteses. Pt is s/p ICE-guided TIPS, w/o paracentesis on 07/13/23. US Paracentesis on 08/12/23 w 4L drawn, c/f SBP requiring 1 wk hospitalization. Pt recuperating at rehabilitation facility in [place].  Pre TIPS MELD  15 (06/17/23). H/E Grade 1.  Post TIPS MELD 19 (08/19/23). H/E Grade 1.  Current pharmacologic encephalopathy prophylaxis/treatment: lactulose TID, Good compliance reported, 3 BM/day. Current diuretic regimen: furosemide 80 mg BID and spirinolactone 25 mg qD  *No VIR concern at this time *standing order for [place] Paracenteses PRN, written by GI *[place] TIPS Doppler in 6 months and (virtual) Clinic visit to follow. *Routine GI and  PCP follow up   Electronically Signed:  Roanna Banning, MD [facility]   Pager. [phone] Clinic. [phone]  I spent a total of 25 Minutes in face to face in clinical consultation, greater than 50% of which was counseling/coordinating care for Mr. Caulder Wehner Hinzman's portal HTN management

## 2023-08-31 NOTE — Progress Notes (Signed)
 Per Caleb Wolf, patient is " tight" in his abdomen and thinks he may need a Paracentesis. He denies any shortness of breath and 02 Sats are good per Caleb Wolf, Patient was brought to the facility at the weight of 199 lb and was weighed yesterday and weight 205 lb. Per Caleb Wolf patient is unstable when trying to stand.  Please advise.   I spoke with Caleb Wolf at [facility] and made her aware per Dr. Levon Hedger, Given previous increase in creatinine with Bactrim, we should recheck BMP now.  Given GFR, this should be taken every other day instead of every day. Please let him know about this ASAP.   Caleb Wolf, please let him know we will need to check his BMP again. Please reorder this test to be performed in a week.   She states understanding that the patient will need to take bactrim every other day instead of daily due to kidney function, and will have his BMP drawn this Friday 09/04/2023.

## 2023-08-31 NOTE — Progress Notes (Signed)
 Caleb Wolf aware we have placed orders for a standing order for paracentesis with the first paracentesis to be done on 09/02/2023 at [facility] and to arrive at 2 pm.

## 2023-08-31 NOTE — Progress Notes (Signed)
 Caleb Wolf, made aware the patient needs to arrive by 2 pm at [facility] Radiology.

## 2023-08-31 NOTE — Progress Notes (Signed)
 I spoke with the nurse at [facility] and made her aware we have an order for a standing order for Paracentesis with the first one being on 09/02/2023  2:15 PM at [facility]TR[facility]. Idell Pickles States understanding.

## 2023-08-31 NOTE — Progress Notes (Signed)
 Unable to reach nurse at [facility] x2. Contacted front desk at [facility] and gave message that paracentesis is scheduled for 09/02/23 at 2:15pm. Pt to arrive at Radiology [facility] at 2 pm.

## 2023-09-02 ENCOUNTER — Encounter (HOSPITAL_COMMUNITY): Payer: Self-pay

## 2023-09-02 ENCOUNTER — Ambulatory Visit (HOSPITAL_COMMUNITY)
Admission: RE | Admit: 2023-09-02 | Discharge: 2023-09-02 | Disposition: A | Discharge status: Home / Self Care | Source: Ambulatory Visit | Attending: Gastroenterology | Admitting: Gastroenterology

## 2023-09-02 DIAGNOSIS — R188 Other ascites: Secondary | ICD-10-CM | POA: Diagnosis not present

## 2023-09-02 DIAGNOSIS — Z9889 Other specified postprocedural states: Secondary | ICD-10-CM | POA: Diagnosis not present

## 2023-09-02 DIAGNOSIS — K7460 Unspecified cirrhosis of liver: Secondary | ICD-10-CM | POA: Insufficient documentation

## 2023-09-02 LAB — GRAM STAIN

## 2023-09-02 LAB — BODY FLUID CELL COUNT WITH DIFFERENTIAL
Eos, Fluid: 0 %
Lymphs, Fluid: 32 %
Monocyte-Macrophage-Serous Fluid: 30 % — ABNORMAL LOW (ref 50–90)
Neutrophil Count, Fluid: 38 % — ABNORMAL HIGH (ref 0–25)
Total Nucleated Cell Count, Fluid: 633 uL (ref 0–1000)

## 2023-09-02 NOTE — Procedures (Signed)
 PROCEDURE SUMMARY:  Successful image-guided paracentesis from the right lower abdomen.  Yielded 3.8 liters of cloudy, orange fluid.  No immediate complications.  EBL = trace. Patient tolerated well.   Specimen was sent for labs.  Please see imaging section of Epic for full dictation.  Pt is s/p TIPS procedure 07/13/23 with Dr. Milford Cage.   Loman Brooklyn PA-C 09/02/2023 2:41 PM

## 2023-09-02 NOTE — Progress Notes (Signed)
 Patient tolerated right sided  Paracentesis procedure well today and 3.8 Liters of cloudy yellow ascites removed with labs collected and sent for processing. Patient and wife verbalized understanding of post procedure instructions and patient left via stretcher with [facility] staff with no acute distress noted.

## 2023-09-03 ENCOUNTER — Encounter (INDEPENDENT_AMBULATORY_CARE_PROVIDER_SITE_OTHER): Payer: Self-pay

## 2023-09-04 ENCOUNTER — Non-Acute Institutional Stay (SKILLED_NURSING_FACILITY): Payer: Self-pay | Admitting: Adult Health

## 2023-09-04 ENCOUNTER — Encounter: Payer: Self-pay | Admitting: Adult Health

## 2023-09-04 DIAGNOSIS — J9611 Chronic respiratory failure with hypoxia: Secondary | ICD-10-CM

## 2023-09-04 DIAGNOSIS — K7290 Hepatic failure, unspecified without coma: Secondary | ICD-10-CM

## 2023-09-04 DIAGNOSIS — I700 Atherosclerosis of aorta: Secondary | ICD-10-CM

## 2023-09-04 DIAGNOSIS — K7460 Unspecified cirrhosis of liver: Secondary | ICD-10-CM | POA: Diagnosis not present

## 2023-09-04 LAB — CYTOLOGY - NON PAP

## 2023-09-04 NOTE — Progress Notes (Signed)
 Location:  [facility] Room Number: 124 Place of Service:  SNF (31)   CODE STATUS: dnr   Allergies  Allergen Reactions   Codeine Sulfate Nausea Only    Chief Complaint  Patient presents with   Acute Visit    Care plan meeting     HPI:  We have come together for his care plan meeting. Family present. BCAT 31/50;  BIMS 14/15 mood 5/30: not sleeping well; decreased energy. He has declined showers. Is using wheelchair without falls since admission. He requires mod to dependent assist with his adl care. He is frequently incontinent of bladder and bowel. Dietary: regular diet setup for meals; appetite 26-75% weight is 202 pounds after paracentesis. Therapy: no ambulation; is able to stand with mod/max assist; no step; upper body min assist; lower body is max assist; brp: max assist. He will continue to be followed for his chronic illnesses including: Aortic atherosclerosis Chronic respiratory failure with hypoxia  Decompensated cirrhosis   Past Medical History:  Diagnosis Date   AAA (abdominal aortic aneurysm) (HCC)    Arthritis    Basal cell carcinoma    BOOP (bronchiolitis obliterans with organizing pneumonia) (HCC) 2023   Carotid stenosis    Chronic kidney disease, stage 3b (HCC) 12/13/2021   Cirrhosis of liver not due to alcohol (HCC)    Coronary artery disease    Multvessel s/p CABG 2015   Enlarged prostate    Essential hypertension    GERD (gastroesophageal reflux disease)    Gout    History of colon polyps    History of hiatal hernia    History of kidney stones    Hyperlipidemia    Leukocytosis 01/21/2023   Lumbar disc disease    Myocardial infarction (HCC) 2015   Oxygen dependent    at bedtime- 2L   Persistent atrial fibrillation (HCC)    8 cardioversions and 2 ablations   Sleep apnea    CPAP   Spinal stenosis     Past Surgical History:  Procedure Laterality Date   ABLATION OF DYSRHYTHMIC FOCUS  07/28/2017   ATRIAL FIBRILLATION  ABLATION N/A 07/28/2017   Procedure: ATRIAL FIBRILLATION ABLATION;  Surgeon: Hillis Range, MD;  Location: [facility] INV[facility]IVE CV LAB;  Service: Cardiovascular;  Laterality: N/A;   ATRIAL FIBRILLATION ABLATION N/A 12/04/2020   Procedure: ATRIAL FIBRILLATION ABLATION;  Surgeon: Hillis Range, MD;  Location: [facility] INVASIVE CV LAB;  Service: Cardiovascular;  Laterality: N/A;   BACK SURGERY     CARDIAC CATHETERIZATION  05/22/2014   Procedure: IABP INSERTION;  Surgeon: Marykay Lex, MD;  Location: [facility] CATH LAB;  Service: Cardiovascular;;   CARDIOVERSION N/A 04/29/2017   Procedure: CARDIOVERSION;  Surgeon: Jonelle Sidle, MD;  Location: [facility] ENDO SU[facility];  Service: Cardiovascular;  Laterality: N/A;   CARDIOVERSION N/A 08/13/2017   Procedure: CARDIOVERSION;  Surgeon: Chrystie Nose, MD;  Location: [facility] ENDOSCOPY;  Service: Cardiovascular;  Laterality: N/A;   CARDIOVERSION N/A 08/15/2019   Procedure: CARDIOVERSION;  Surgeon: Lars Masson, MD;  Location: [facility] ENDOSCOPY;  Service: Cardiovascular;  Laterality: N/A;   CARDIOVERSION N/A 03/09/2020   Procedure: CARDIOVERSION;  Surgeon: Jodelle Red, MD;  Location: [facility] ENDOSCOPY;  Service: Cardiovascular;  Laterality: N/A;   CARDIOVERSION N/A 07/06/2020   Procedure: CARDIOVERSION;  Surgeon: Thurmon Fair, MD;  Location: [facility] ENDOSCOPY;  Service: Cardiovascular;  Laterality: N/A;   CARDIOVERSION N/A 01/18/2021   Procedure: CARDIOVERSION;  Surgeon: Chilton Si, MD;  Location: [facility] ENDOSCOPY;  Service: Cardiovascular;  Laterality: N/A;  CARDIOVERSION N/A 06/17/2021   Procedure: CARDIOVERSION;  Surgeon: Sande Rives, MD;  Location: [facility] ENDOSCOPY;  Service: Cardiovascular;  Laterality: N/A;   Cataract surgery Bilateral    COLONOSCOPY     COLONOSCOPY N/A 12/30/2017   Procedure: COLONOSCOPY;  Surgeon: Malissa Hippo, MD;  Location: [facility] ENDO [facility];  Service: Endoscopy;  Laterality: N/A;   CORONARY ARTERY BYPASS GRAFT N/A 05/22/2014   Procedure:  CORONARY ARTERY BYPASS GRAFTING (CABG) times three using left internal mammary and right saphenous vein.;  Surgeon: Loreli Slot, MD;  Location: [facility] OR;  Service: Open Heart Surgery;  Laterality: N/A;   ENDARTERECTOMY Left 02/17/2014   Procedure: ENDARTERECTOMY CAROTID WITH PATCH ANGIOPLASTY;  Surgeon: Pryor Ochoa, MD;  Location: [facility] OR;  Service: Vascular;  Laterality: Left;   ESOPHAGEAL DILATION N/A 08/22/2015   Procedure: ESOPHAGEAL DILATION;  Surgeon: Malissa Hippo, MD;  [facility]: [facility];  Service: Endoscopy;  Laterality: N/A;   ESOPHAGOGASTRODUODENOSCOPY N/A 08/22/2015   Procedure: ESOPHAGOGASTRODUODENOSCOPY (EGD);  Surgeon: Malissa Hippo, MD;  Location: [facility];  Service: Endoscopy;  Laterality: N/A;  12:45 - moved to 1:55 - Ann notified pt   ESOPHAGOGASTRODUODENOSCOPY N/A 12/30/2017   Procedure: ESOPHAGOGASTRODUODENOSCOPY (EGD);  Surgeon: Malissa Hippo, MD;  Location: [facility];  Service: Endoscopy;  Laterality: N/A;  200   EYE SURGERY     cataract extraction (right) with repair macular tear , with IOL     right   FRACTURE SURGERY     bilateral wrist fractures- one ORIF   IR RADIOLOGIST EVAL & MGMT  12/01/2022   IR RADIOLOGIST EVAL & MGMT  06/25/2023   IR RADIOLOGIST EVAL & MGMT  08/26/2023   IR TIPS  07/13/2023   IR US GUIDE VASC ACCESS RIGHT  07/13/2023   JOINT REPLACEMENT  2011   left knee   LEFT HEART CATHETERIZATION WITH CORONARY ANGIOGRAM N/A 05/22/2014   Procedure: LEFT HEART CATHETERIZATION WITH CORONARY ANGIOGRAM;  Surgeon: Marykay Lex, MD;  Location: [facility] CATH LAB;  Service: Cardiovascular;  Laterality: N/A;   LITHOTRIPSY     PARS PLANA VITRECTOMY W/ REPAIR OF MACULAR HOLE     RHINOPLASTY     TEE WITHOUT CARDIOVERSION N/A 04/29/2017   Procedure: TRANSESOPHAGEAL ECHOCARDIOGRAM (TEE) WITH PROPOFOL;  Surgeon: Jonelle Sidle, MD;  Location: [facility];  Service: Cardiovascular;  Laterality: N/A;   TIPS PROCEDURE N/A 07/13/2023    Procedure: TRANS-JUGULAR INTRAHEPATIC PORTAL SHUNT (TIPS);  Surgeon: Roanna Banning, MD;  Location: [facility] OR;  Service: Radiology;  Laterality: N/A;   TONSILLECTOMY     TOTAL HIP ARTHROPLASTY  12/08/2011   Procedure: TOTAL HIP ARTHROPLASTY;  Surgeon: Loanne Drilling, MD;  Location: [facility] ORS;  Service: Orthopedics;  Laterality: Right;   UPPER GI ENDOSCOPY  12/18/2015   Procedure: UPPER GI ENDOSCOPY;  Surgeon: Axel Filler, MD;  Location: [facility] ORS;  Service: General;;   WRIST SURGERY Right 61yrs ago   WRIST SURGERY Left     Social History   Socioeconomic History   Marital status: Married    Spouse name: Not on file   Number of children: Not on file   Years of education: Not on file   Highest education level: Not on file  Occupational History   Occupation: retired    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  Tobacco Use   Smoking status: Former    Current packs/day: 0.00    Average packs/day: 1.5 packs/day for 25.0 years (37.5 ttl pk-yrs)    Types:  Cigarettes    Start date: 08/08/1960    Quit date: 06/02/1982    Years since quitting: 41.2    Passive exposure: Never   Smokeless tobacco: Never   Tobacco comments:    quit smoking 30+yrs ago  Vaping Use   Vaping status: Never Used  Substance and Sexual Activity   Alcohol use: No    Alcohol/week: 0.0 standard drinks of alcohol    Comment: occasionally    Drug use: No   Sexual activity: Yes  Other Topics Concern   Not on file  Social History Narrative   Not on file   Social Drivers of Health   Financial Resource Strain: Not on file  Food Insecurity: No Food Insecurity (08/12/2023)   Hunger Vital Sign    Worried About Running Out of Food in the Last Year: Never true    Ran Out of Food in the Last Year: Never true  Transportation Needs: No Transportation Needs (08/12/2023)   PRAPARE - Administrator, Civil Service (Medical): No    Lack of Transportation (Non-Medical): No  Physical Activity: Not on file  Stress: Not on file   Social Connections: Socially Integrated (08/12/2023)   Social Connection and Isolation Panel [NHANES]    Frequency of Communication with Friends and Family: More than three times a week    Frequency of Social Gatherings with Friends and Family: More than three times a week    Attends Religious Services: More than 4 times per year    Active Member of [employer] or Organizations: Yes    Attends Banker Meetings: Never    Marital Status: Married  Catering manager Violence: Not At Risk (08/12/2023)   Humiliation, Afraid, Rape, and Kick questionnaire    Fear of Current or Ex-Partner: No    Emotionally Abused: No    Physically Abused: No    Sexually Abused: No   Family History  Problem Relation Age of Onset   Allergies Mother    Heart disease Mother    Hypertension Mother    Heart disease Father        MI      VITAL SIGNS BP (!) 124/49   Pulse 60   Temp 98.8 F (37.1 C)   Resp 18   Ht 5\' 9"  (1.753 m)   Wt 202 lb 6.4 oz (91.8 kg)   SpO2 97%   BMI 29.89 kg/m   Outpatient Encounter Medications as of 09/04/2023  Medication Sig Note   acetaminophen (TYLENOL) 325 MG tablet Take 650 mg by mouth every 6 (six) hours as needed.    amiodarone (PACERONE) 200 MG tablet TAKE 1 TABLET BY MOUTH EVERY DAY    atorvastatin (LIPITOR) 80 MG tablet TAKE 1 TABLET BY MOUTH EVERY DAY IN THE EVENING    Cholecalciferol (VITAMIN D) 50 MCG (2000 UT) CAPS Take 2,000 Units by mouth daily.    cyanocobalamin (VITAMIN B12) 1000 MCG tablet Take 1,000 mcg by mouth 2 (two) times a week. Tuesday and Friday    doxazosin (CARDURA) 4 MG tablet Take 4 mg by mouth in the morning.    empagliflozin (JARDIANCE) 10 MG TABS tablet TAKE 1 TABLET BY MOUTH DAILY BEFORE BREAKFAST.    feeding supplement (BOOST HIGH PROTEIN) LIQD Take 1 Container by mouth daily in the afternoon. 07/15/2023: Uses protein  supplement    finasteride (PROSCAR) 5 MG tablet Take 5 mg by mouth every evening.     fluticasone (FLONASE) 50 MCG/ACT  nasal spray Place 1  spray into both nostrils daily as needed for allergies or rhinitis.    furosemide (LASIX) 40 MG tablet Take 1 tablet (40 mg total) by mouth daily in the afternoon.    furosemide (LASIX) 80 MG tablet Take 1 tablet (80 mg total) by mouth daily.    lactulose (CHRONULAC) 10 GM/15ML solution Take 67.5 mLs (45 g total) by mouth 3 (three) times daily. Titrate dosage of lactulose to 2-3 soft bowels per day (Patient taking differently: Take 40 g by mouth 2 (two) times daily.) 08/24/2023: Three times daily    nitroGLYCERIN (NITROSTAT) 0.4 MG SL tablet Place 1 tablet (0.4 mg total) under the tongue every 5 (five) minutes x 3 doses as needed for chest pain.    omeprazole (PRILOSEC) 40 MG capsule Take 40 mg by mouth daily.    Probiotic Product (PROBIOTIC DAILY PO) Take by mouth. S. Boulardii saccharomyces boulardii 250mg  capsule twice a day    Rivaroxaban (XARELTO) 15 MG TABS tablet Take 1 tablet (15 mg total) by mouth daily with supper.    sertraline (ZOLOFT) 25 MG tablet Take 1 tablet (25 mg total) by mouth at bedtime.    spironolactone (ALDACTONE) 25 MG tablet TAKE 1 TABLET (25 MG TOTAL) BY MOUTH DAILY.    sulfamethoxazole-trimethoprim (BACTRIM DS) 800-160 MG tablet Take 1 tablet by mouth daily.    tiZANidine (ZANAFLEX) 2 MG tablet Take 2 mg by mouth 2 (two) times daily as needed for muscle spasms.    No facility-administered encounter medications on file as of 09/04/2023.     SIGNIFICANT DIAGNOSTIC EXAMS  Review of Systems  Constitutional:  Negative for malaise/fatigue.  Respiratory:  Negative for cough and shortness of breath.   Cardiovascular:  Negative for chest pain, palpitations and leg swelling.  Gastrointestinal:  Negative for abdominal pain, constipation and heartburn.  Musculoskeletal:  Negative for back pain, joint pain and myalgias.  Skin: Negative.   Neurological:  Negative for dizziness.  Psychiatric/Behavioral:  The patient is not nervous/anxious.    Physical  Exam Constitutional:      General: He is not in acute distress.    Appearance: He is well-developed and overweight. He is not diaphoretic.  Neck:     Thyroid: No thyromegaly.  Cardiovascular:     Rate and Rhythm: Normal rate and regular rhythm.     Pulses: Normal pulses.     Heart sounds: Normal heart sounds.  Pulmonary:     Effort: Pulmonary effort is normal. No respiratory distress.     Breath sounds: Normal breath sounds.  Abdominal:     General: Bowel sounds are normal. There is no distension.     Palpations: Abdomen is soft.     Tenderness: There is no abdominal tenderness.  Musculoskeletal:        General: Normal range of motion.     Cervical back: Neck supple.     Right lower leg: No edema.     Left lower leg: No edema.     Comments: Weakness present  Lymphadenopathy:     Cervical: No cervical adenopathy.  Skin:    General: Skin is warm and dry.  Neurological:     Mental Status: He is alert and oriented to person, place, and time.  Psychiatric:        Mood and Affect: Mood normal.      ASSESSMENT/ PLAN:  TODAY  Aortic atherosclerosis (ct 08-15-23) Chronic respiratory failure with hypoxia Decompensated cirrhosis   Will begin prosource 30 mL three times daily  Will  continue therapy as directed Will continue to monitor his status Goal of care: undetermined at this time   Time spent with patient: 40 minutes: medications; goals of care; therapy    Synthia Innocent NP [facility]  call [phone]

## 2023-09-07 ENCOUNTER — Other Ambulatory Visit (HOSPITAL_COMMUNITY): Payer: Self-pay | Admitting: Adult Health

## 2023-09-07 ENCOUNTER — Other Ambulatory Visit: Payer: Self-pay | Admitting: *Deleted

## 2023-09-07 ENCOUNTER — Encounter: Payer: Self-pay | Admitting: Adult Health

## 2023-09-07 ENCOUNTER — Other Ambulatory Visit (HOSPITAL_COMMUNITY)
Admission: RE | Admit: 2023-09-07 | Discharge: 2023-09-07 | Disposition: A | Discharge status: Home / Self Care | Source: Skilled Nursing Facility | Attending: Adult Health | Admitting: Adult Health

## 2023-09-07 ENCOUNTER — Non-Acute Institutional Stay (SKILLED_NURSING_FACILITY): Payer: Self-pay | Admitting: Adult Health

## 2023-09-07 DIAGNOSIS — E43 Unspecified severe protein-calorie malnutrition: Secondary | ICD-10-CM

## 2023-09-07 DIAGNOSIS — K7460 Unspecified cirrhosis of liver: Secondary | ICD-10-CM | POA: Diagnosis not present

## 2023-09-07 DIAGNOSIS — I5032 Chronic diastolic (congestive) heart failure: Secondary | ICD-10-CM | POA: Insufficient documentation

## 2023-09-07 DIAGNOSIS — K7290 Hepatic failure, unspecified without coma: Secondary | ICD-10-CM | POA: Diagnosis not present

## 2023-09-07 DIAGNOSIS — N1832 Chronic kidney disease, stage 3b: Secondary | ICD-10-CM

## 2023-09-07 DIAGNOSIS — R748 Abnormal levels of other serum enzymes: Secondary | ICD-10-CM

## 2023-09-07 LAB — COMPREHENSIVE METABOLIC PANEL WITH GFR
ALT: 148 U/L — ABNORMAL HIGH (ref 0–44)
AST: 158 U/L — ABNORMAL HIGH (ref 15–41)
Albumin: 2.1 g/dL — ABNORMAL LOW (ref 3.5–5.0)
Alkaline Phosphatase: 79 U/L (ref 38–126)
Anion gap: 14 (ref 5–15)
BUN: 40 mg/dL — ABNORMAL HIGH (ref 8–23)
CO2: 25 mmol/L (ref 22–32)
Calcium: 8.1 mg/dL — ABNORMAL LOW (ref 8.9–10.3)
Chloride: 95 mmol/L — ABNORMAL LOW (ref 98–111)
Creatinine, Ser: 2.25 mg/dL — ABNORMAL HIGH (ref 0.61–1.24)
GFR, Estimated: 29 mL/min — ABNORMAL LOW (ref >60)
Glucose, Bld: 86 mg/dL (ref 70–99)
Potassium: 3.9 mmol/L (ref 3.5–5.1)
Sodium: 134 mmol/L — ABNORMAL LOW (ref 135–145)
Total Bilirubin: 0.6 mg/dL (ref 0.0–1.2)
Total Protein: 4.9 g/dL — ABNORMAL LOW (ref 6.5–8.1)

## 2023-09-07 LAB — CULTURE, BODY FLUID W GRAM STAIN -BOTTLE: Culture: NO GROWTH

## 2023-09-07 NOTE — Patient Outreach (Signed)
 Post-Acute Care Manager follow up. Mr. Dhanani reside in [facility].  Screening for potential complex care management services as benefit of health plan and primary care provider.  Collaboration with Melton Alar social worker. Discharge plans are pending progress. From home with spouse.   Will continue to follow for potential complex care management needs.     Raiford Noble, MSN, RN, BSN [place]  [facility] RN Post- Acute Care Manager Direct Dial: [phone]

## 2023-09-07 NOTE — Progress Notes (Unsigned)
 Location:  [facility] Room Number: 138 Place of Service:  SNF (31)   CODE STATUS: dnr   Allergies  Allergen Reactions   Codeine Sulfate Nausea Only    Chief Complaint  Patient presents with   Acute Visit    Follow up labs     HPI:  His liver enzymes are elevated with ast 158 and alt 148. His albumin is 2.1.  He had a paracentesis on 09-02-23 with a 3 liter return. He is complaining of right lower quad/right upper quad pain. He is complaining of being weak and fatigued. Is bradycardic with pulse as low as 48;   Past Medical History:  Diagnosis Date   AAA (abdominal aortic aneurysm) (HCC)    Arthritis    Basal cell carcinoma    BOOP (bronchiolitis obliterans with organizing pneumonia) (HCC) 2023   Carotid stenosis    Chronic kidney disease, stage 3b (HCC) 12/13/2021   Cirrhosis of liver not due to alcohol (HCC)    Coronary artery disease    Multvessel s/p CABG 2015   Enlarged prostate    Essential hypertension    GERD (gastroesophageal reflux disease)    Gout    History of colon polyps    History of hiatal hernia    History of kidney stones    Hyperlipidemia    Leukocytosis 01/21/2023   Lumbar disc disease    Myocardial infarction (HCC) 2015   Oxygen dependent    at bedtime- 2L   Persistent atrial fibrillation (HCC)    8 cardioversions and 2 ablations   Sleep apnea    CPAP   Spinal stenosis     Past Surgical History:  Procedure Laterality Date   ABLATION OF DYSRHYTHMIC FOCUS  07/28/2017   ATRIAL FIBRILLATION ABLATION N/A 07/28/2017   Procedure: ATRIAL FIBRILLATION ABLATION;  Surgeon: Hillis Range, MD;  Location: [facility] INVASIVE CV LAB;  Service: Cardiovascular;  Laterality: N/A;   ATRIAL FIBRILLATION ABLATION N/A 12/04/2020   Procedure: ATRIAL FIBRILLATION ABLATION;  Surgeon: Hillis Range, MD;  Location: [facility] INVASIVE CV LAB;  Service: Cardiovascular;  Laterality: N/A;   BACK SURGERY     CARDIAC CATHETERIZATION  05/22/2014   Procedure:  IABP INSERTION;  Surgeon: Marykay Lex, MD;  Location: [facility] CATH LAB;  Service: Cardiovascular;;   CARDIOVERSION N/A 04/29/2017   Procedure: CARDIOVERSION;  Surgeon: Jonelle Sidle, MD;  Location: [facility] ENDO SUITE;  Service: Cardiovascular;  Laterality: N/A;   CARDIOVERSION N/A 08/13/2017   Procedure: CARDIOVERSION;  Surgeon: Chrystie Nose, MD;  Location: [facility] ENDOSCOPY;  Service: Cardiovascular;  Laterality: N/A;   CARDIOVERSION N/A 08/15/2019   Procedure: CARDIOVERSION;  Surgeon: Lars Masson, MD;  Location: [facility] ENDOSCOPY;  Service: Cardiovascular;  Laterality: N/A;   CARDIOVERSION N/A 03/09/2020   Procedure: CARDIOVERSION;  Surgeon: Jodelle Red, MD;  Location: [facility] ENDOSCOPY;  Service: Cardiovascular;  Laterality: N/A;   CARDIOVERSION N/A 07/06/2020   Procedure: CARDIOVERSION;  Surgeon: Thurmon Fair, MD;  Location: [facility] ENDOSCOPY;  Service: Cardiovascular;  Laterality: N/A;   CARDIOVERSION N/A 01/18/2021   Procedure: CARDIOVERSION;  Surgeon: Chilton Si, MD;  Location: [facility] ENDOSCOPY;  Service: Cardiovascular;  Laterality: N/A;   CARDIOVERSION N/A 06/17/2021   Procedure: CARDIOVERSION;  Surgeon: Sande Rives, MD;  Location: [facility] ENDOSCOPY;  Service: Cardiovascular;  Laterality: N/A;   Cataract surgery Bilateral    COLONOSCOPY     COLONOSCOPY N/A 12/30/2017   Procedure: COLONOSCOPY;  Surgeon: Malissa Hippo, MD;  Location: [facility] ENDO [facility];  Service:  Endoscopy;  Laterality: N/A;   CORONARY ARTERY BYPASS GRAFT N/A 05/22/2014   Procedure: CORONARY ARTERY BYPASS GRAFTING (CABG) times three using left internal mammary and right saphenous vein.;  Surgeon: Loreli Slot, MD;  Location: [facility] OR;  Service: Open Heart Surgery;  Laterality: N/A;   ENDARTERECTOMY Left 02/17/2014   Procedure: ENDARTERECTOMY CAROTID WITH PATCH ANGIOPLASTY;  Surgeon: Pryor Ochoa, MD;  Location: [facility] OR;  Service: Vascular;  Laterality: Left;   ESOPHAGEAL DILATION N/A 08/22/2015    Procedure: ESOPHAGEAL DILATION;  Surgeon: Malissa Hippo, MD;  Location: [facility] ENDO [facility];  Service: Endoscopy;  Laterality: N/A;   ESOPHAGOGASTRODUODENOSCOPY N/A 08/22/2015   Procedure: ESOPHAGOGASTRODUODENOSCOPY (EGD);  Surgeon: Malissa Hippo, MD;  Location: [facility] ENDO SU[facility];  Service: Endoscopy;  Laterality: N/A;  12:45 - moved to 1:55 - Ann notified pt   ESOPHAGOGASTRODUODENOSCOPY N/A 12/30/2017   Procedure: ESOPHAGOGASTRODUODENOSCOPY (EGD);  Surgeon: Malissa Hippo, MD;  Location: [facility] ENDO [facility];  Service: Endoscopy;  Laterality: N/A;  200   EYE SURGERY     cataract extraction (right) with repair macular tear , with IOL     right   FRACTURE SURGERY     bilateral wrist fractures- one ORIF   IR RADIOLOGIST EVAL & MGMT  12/01/2022   IR RADIOLOGIST EVAL & MGMT  06/25/2023   IR RADIOLOGIST EVAL & MGMT  08/26/2023   IR TIPS  07/13/2023   IR US GUIDE VASC ACCESS RIGHT  07/13/2023   JOINT REPLACEMENT  2011   left knee   LEFT HEART CATHETERIZATION WITH CORONARY ANGIOGRAM N/A 05/22/2014   Procedure: LEFT HEART CATHETERIZATION WITH CORONARY ANGIOGRAM;  Surgeon: Marykay Lex, MD;  Location: [facility] CATH LAB;  Service: Cardiovascular;  Laterality: N/A;   LITHOTRIPSY     PARS PLANA VITRECTOMY W/ REPAIR OF MACULAR HOLE     RHINOPLASTY     TEE WITHOUT CARDIOVERSION N/A 04/29/2017   Procedure: TRANSESOPHAGEAL ECHOCARDIOGRAM (TEE) WITH PROPOFOL;  Surgeon: Jonelle Sidle, MD;  Location: [facility] ENDO SUITE;  Service: Cardiovascular;  Laterality: N/A;   TIPS PROCEDURE N/A 07/13/2023   Procedure: TRANS-JUGULAR INTRAHEPATIC PORTAL SHUNT (TIPS);  Surgeon: Roanna Banning, MD;  Location: [facility] OR;  Service: Radiology;  Laterality: N/A;   TONSILLECTOMY     TOTAL HIP ARTHROPLASTY  12/08/2011   Procedure: TOTAL HIP ARTHROPLASTY;  Surgeon: Loanne Drilling, MD;  Location: [facility] ORS;  Service: Orthopedics;  Laterality: Right;   UPPER GI ENDOSCOPY  12/18/2015   Procedure: UPPER GI ENDOSCOPY;  Surgeon: Axel Filler, MD;   Location: [facility] ORS;  Service: General;;   WRIST SURGERY Right 44yrs ago   WRIST SURGERY Left     Social History   Socioeconomic History   Marital status: Married    Spouse name: Not on file   Number of children: Not on file   Years of education: Not on file   Highest education level: Not on file  Occupational History   Occupation: retired    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  Tobacco Use   Smoking status: Former    Current packs/day: 0.00    Average packs/day: 1.5 packs/day for 25.0 years (37.5 ttl pk-yrs)    Types: Cigarettes    Start date: 08/08/1960    Quit date: 06/02/1982    Years since quitting: 41.2    Passive exposure: Never   Smokeless tobacco: Never   Tobacco comments:    quit smoking 30+yrs ago  Vaping Use   Vaping status: Never Used  Substance and Sexual Activity  Alcohol use: No    Alcohol/week: 0.0 standard drinks of alcohol    Comment: occasionally    Drug use: No   Sexual activity: Yes  Other Topics Concern   Not on file  Social History Narrative   Not on file   Social Drivers of Health   Financial Resource Strain: Not on file  Food Insecurity: No Food Insecurity (08/12/2023)   Hunger Vital Sign    Worried About Running Out of Food in the Last Year: Never true    Ran Out of Food in the Last Year: Never true  Transportation Needs: No Transportation Needs (08/12/2023)   PRAPARE - Administrator, Civil Service (Medical): No    Lack of Transportation (Non-Medical): No  Physical Activity: Not on file  Stress: Not on file  Social Connections: Socially Integrated (08/12/2023)   Social Connection and Isolation Panel [NHANES]    Frequency of Communication with Friends and Family: More than three times a week    Frequency of Social Gatherings with Friends and Family: More than three times a week    Attends Religious Services: More than 4 times per year    Active Member of [employer] or Organizations: Yes    Attends Banker Meetings: Never     Marital Status: Married  Catering manager Violence: Not At Risk (08/12/2023)   Humiliation, Afraid, Rape, and Kick questionnaire    Fear of Current or Ex-Partner: No    Emotionally Abused: No    Physically Abused: No    Sexually Abused: No   Family History  Problem Relation Age of Onset   Allergies Mother    Heart disease Mother    Hypertension Mother    Heart disease Father        MI      VITAL SIGNS BP 125/64   Pulse 60   Temp 97.7 F (36.5 C)   Resp 20   Ht 5\' 9"  (1.753 m)   Wt 198 lb 3.2 oz (89.9 kg)   SpO2 95%   BMI 29.27 kg/m   Outpatient Encounter Medications as of 09/07/2023  Medication Sig Note   acetaminophen (TYLENOL) 325 MG tablet Take 650 mg by mouth every 6 (six) hours as needed.    amiodarone (PACERONE) 200 MG tablet TAKE 1 TABLET BY MOUTH EVERY DAY    atorvastatin (LIPITOR) 80 MG tablet TAKE 1 TABLET BY MOUTH EVERY DAY IN THE EVENING    Cholecalciferol (VITAMIN D) 50 MCG (2000 UT) CAPS Take 2,000 Units by mouth daily.    cyanocobalamin (VITAMIN B12) 1000 MCG tablet Take 1,000 mcg by mouth 2 (two) times a week. Tuesday and Friday    doxazosin (CARDURA) 4 MG tablet Take 4 mg by mouth in the morning.    empagliflozin (JARDIANCE) 10 MG TABS tablet TAKE 1 TABLET BY MOUTH DAILY BEFORE BREAKFAST.    feeding supplement (BOOST HIGH PROTEIN) LIQD Take 1 Container by mouth daily in the afternoon. 07/15/2023: Uses protein  supplement    finasteride (PROSCAR) 5 MG tablet Take 5 mg by mouth every evening.     fluticasone (FLONASE) 50 MCG/ACT nasal spray Place 1 spray into both nostrils daily as needed for allergies or rhinitis.    furosemide (LASIX) 40 MG tablet Take 1 tablet (40 mg total) by mouth daily in the afternoon.    furosemide (LASIX) 80 MG tablet Take 1 tablet (80 mg total) by mouth daily.    lactulose (CHRONULAC) 10 GM/15ML solution Take 67.5 mLs (45  g total) by mouth 3 (three) times daily. Titrate dosage of lactulose to 2-3 soft bowels per day (Patient  taking differently: Take 40 g by mouth 2 (two) times daily.) 08/24/2023: Three times daily    nitroGLYCERIN (NITROSTAT) 0.4 MG SL tablet Place 1 tablet (0.4 mg total) under the tongue every 5 (five) minutes x 3 doses as needed for chest pain.    omeprazole (PRILOSEC) 40 MG capsule Take 40 mg by mouth daily.    Probiotic Product (PROBIOTIC DAILY PO) Take by mouth. S. Boulardii saccharomyces boulardii 250mg  capsule twice a day    Protein (PROSOURCE PO) Take 30 mLs by mouth 3 (three) times daily.    Rivaroxaban (XARELTO) 15 MG TABS tablet Take 1 tablet (15 mg total) by mouth daily with supper.    sertraline (ZOLOFT) 25 MG tablet Take 1 tablet (25 mg total) by mouth at bedtime.    spironolactone (ALDACTONE) 25 MG tablet TAKE 1 TABLET (25 MG TOTAL) BY MOUTH DAILY.    sulfamethoxazole-trimethoprim (BACTRIM DS) 800-160 MG tablet Take 1 tablet by mouth daily.    tiZANidine (ZANAFLEX) 2 MG tablet Take 2 mg by mouth 2 (two) times daily as needed for muscle spasms.    No facility-administered encounter medications on file as of 09/07/2023.     SIGNIFICANT DIAGNOSTIC EXAMS  TODAY  09-07-23: glucose 86; bun 40; creat 2.25; k+ 3.9; na++ 134; ca 8.1 gfr 29; protein 4.9 albumin 2.1; ast 158 alt 148   Review of Systems  Constitutional:  Positive for malaise/fatigue.  Respiratory:  Negative for cough and shortness of breath.   Cardiovascular:  Negative for chest pain, palpitations and leg swelling.  Gastrointestinal:  Positive for abdominal pain and nausea. Negative for constipation, heartburn and vomiting.  Musculoskeletal:  Negative for back pain, joint pain and myalgias.  Skin: Negative.   Neurological:  Negative for dizziness.  Psychiatric/Behavioral:  The patient is not nervous/anxious.     Physical Exam Constitutional:      General: He is not in acute distress.    Appearance: He is well-developed and overweight. He is not diaphoretic.  Neck:     Thyroid: No thyromegaly.  Cardiovascular:      Rate and Rhythm: Regular rhythm. Bradycardia present.     Heart sounds: Normal heart sounds.  Pulmonary:     Effort: Pulmonary effort is normal. No respiratory distress.     Breath sounds: Normal breath sounds.  Abdominal:     General: Bowel sounds are normal. There is distension.     Palpations: Abdomen is soft.     Tenderness: There is no abdominal tenderness.  Musculoskeletal:        General: Normal range of motion.     Cervical back: Neck supple.     Right lower leg: No edema.     Left lower leg: No edema.  Lymphadenopathy:     Cervical: No cervical adenopathy.  Skin:    General: Skin is warm and dry.  Neurological:     Mental Status: He is alert and oriented to person, place, and time.  Psychiatric:        Mood and Affect: Mood normal.      ASSESSMENT/ PLAN:  TODAY  Decompensated cirrhosis  Chronic kidney disease stage 3b Severe protein calorie malnutrition  Will begin zofran 4 mg AC and HS Will recheck hepatic panel on 09-10-23 Will lower amiodarone to 100 mg daily due to bradycardia Has recently been started on pro stat four times daily  Will monitor  his status     Synthia Innocent NP [facility]   call [phone]

## 2023-09-08 ENCOUNTER — Ambulatory Visit (HOSPITAL_COMMUNITY)
Admission: RE | Admit: 2023-09-08 | Discharge: 2023-09-08 | Disposition: A | Discharge status: Home / Self Care | Source: Ambulatory Visit | Attending: Adult Health | Admitting: Adult Health

## 2023-09-08 DIAGNOSIS — E43 Unspecified severe protein-calorie malnutrition: Secondary | ICD-10-CM | POA: Insufficient documentation

## 2023-09-08 DIAGNOSIS — R748 Abnormal levels of other serum enzymes: Secondary | ICD-10-CM | POA: Diagnosis not present

## 2023-09-08 DIAGNOSIS — K7689 Other specified diseases of liver: Secondary | ICD-10-CM | POA: Diagnosis not present

## 2023-09-08 DIAGNOSIS — K7460 Unspecified cirrhosis of liver: Secondary | ICD-10-CM | POA: Diagnosis not present

## 2023-09-08 DIAGNOSIS — R188 Other ascites: Secondary | ICD-10-CM | POA: Diagnosis not present

## 2023-09-10 ENCOUNTER — Other Ambulatory Visit (HOSPITAL_COMMUNITY)
Admission: RE | Admit: 2023-09-10 | Discharge: 2023-09-10 | Disposition: A | Discharge status: Home / Self Care | Source: Skilled Nursing Facility | Attending: Adult Health | Admitting: Adult Health

## 2023-09-10 LAB — HEPATIC FUNCTION PANEL
ALT: 126 U/L — ABNORMAL HIGH (ref 0–44)
AST: 134 U/L — ABNORMAL HIGH (ref 15–41)
Albumin: 2.1 g/dL — ABNORMAL LOW (ref 3.5–5.0)
Alkaline Phosphatase: 93 U/L (ref 38–126)
Bilirubin, Direct: 0.2 mg/dL (ref 0.0–0.2)
Indirect Bilirubin: 0.6 mg/dL (ref 0.3–0.9)
Total Bilirubin: 0.8 mg/dL (ref 0.0–1.2)
Total Protein: 4.8 g/dL — ABNORMAL LOW (ref 6.5–8.1)

## 2023-09-14 ENCOUNTER — Telehealth (INDEPENDENT_AMBULATORY_CARE_PROVIDER_SITE_OTHER): Payer: Self-pay | Admitting: *Deleted

## 2023-09-14 NOTE — Telephone Encounter (Signed)
 Wife called and states he is not making any progress at [facility]. His 21 days are almost over. Going to move to [place] and has found primary care doctor for him. He is almost bedridden and has a bed sore about the size of a quarter. His son is in [place]. Richard at radiology told her that you could write orders and it would be accepted at any facility. Will move on May 19th. Wanted to know if chelsea will continue to write orders until he can get in with new GI and do you have any GI that you recommend. He will have private pay caregivers.

## 2023-09-16 ENCOUNTER — Non-Acute Institutional Stay (SKILLED_NURSING_FACILITY): Payer: Self-pay | Admitting: Adult Health

## 2023-09-16 ENCOUNTER — Ambulatory Visit (HOSPITAL_COMMUNITY)
Admission: RE | Admit: 2023-09-16 | Discharge: 2023-09-16 | Disposition: A | Discharge status: Home / Self Care | Source: Ambulatory Visit | Attending: Gastroenterology | Admitting: Gastroenterology

## 2023-09-16 ENCOUNTER — Encounter (HOSPITAL_COMMUNITY): Payer: Self-pay

## 2023-09-16 ENCOUNTER — Encounter: Payer: Self-pay | Admitting: Adult Health

## 2023-09-16 VITALS — BP 132/56 | HR 93 | Resp 16

## 2023-09-16 DIAGNOSIS — K7460 Unspecified cirrhosis of liver: Secondary | ICD-10-CM | POA: Insufficient documentation

## 2023-09-16 DIAGNOSIS — E43 Unspecified severe protein-calorie malnutrition: Secondary | ICD-10-CM

## 2023-09-16 DIAGNOSIS — R188 Other ascites: Secondary | ICD-10-CM | POA: Diagnosis not present

## 2023-09-16 DIAGNOSIS — F32A Depression, unspecified: Secondary | ICD-10-CM

## 2023-09-16 DIAGNOSIS — I5022 Chronic systolic (congestive) heart failure: Secondary | ICD-10-CM | POA: Diagnosis not present

## 2023-09-16 DIAGNOSIS — K7290 Hepatic failure, unspecified without coma: Secondary | ICD-10-CM

## 2023-09-16 LAB — BODY FLUID CELL COUNT WITH DIFFERENTIAL
Lymphs, Fluid: 73 %
Monocyte-Macrophage-Serous Fluid: 13 % — ABNORMAL LOW (ref 50–90)
Neutrophil Count, Fluid: 14 % (ref 0–25)
Total Nucleated Cell Count, Fluid: 613 uL (ref 0–1000)

## 2023-09-16 LAB — GRAM STAIN

## 2023-09-16 NOTE — Telephone Encounter (Signed)
 Wife states she will ask new pcp in the area they are moving for GI recommendation and will let us  know if she needs anything before they get set up with new GI.

## 2023-09-16 NOTE — Procedures (Signed)
 PROCEDURE SUMMARY:  Successful image-guided paracentesis from the left lower abdomen.  Yielded 2.3 liters of hazy orange fluid.  No immediate complications.  EBL: zero Patient tolerated well.   Specimen was sent for labs.  Please see imaging section of Epic for full dictation.  Patient is s/p TIPS procedure 07/13/23 with Dr. Darylene Epley. Per discussion with his wife today they are planning to pursue hospice care at this time.  Marilu Shown PA-C 09/16/2023 2:47 PM

## 2023-09-16 NOTE — Progress Notes (Signed)
 Patient tolerated left sided Paracentesis procedure well today and 2.3 Liters of cloudy yellow ascites removed. Patient and wife verbalized understanding of post procedure instructions and patient transported via stretcher back to [facility] room assignment at this time with no acute distress noted. Labs collected and sent to lab for processing.

## 2023-09-16 NOTE — Progress Notes (Signed)
 Location:  [facility] Room Number: 134 Place of Service:  SNF (31)   CODE STATUS: dnr   Allergies  Allergen Reactions   Codeine Sulfate Nausea Only    Chief Complaint  Patient presents with   Acute Visit    Change in status     HPI:  His ability to participate in therapy is declining. He is unable to stand without max assist of two people. He is less engaging with those around him. Will answer questions; but does not initiate conversation. His appetite remains very poor. His overall health status is declining.   Past Medical History:  Diagnosis Date   AAA (abdominal aortic aneurysm) (HCC)    Arthritis    Basal cell carcinoma    BOOP (bronchiolitis obliterans with organizing pneumonia) (HCC) 2023   Carotid stenosis    Chronic kidney disease, stage 3b (HCC) 12/13/2021   Cirrhosis of liver not due to alcohol (HCC)    Coronary artery disease    Multvessel s/p CABG 2015   Enlarged prostate    Essential hypertension    GERD (gastroesophageal reflux disease)    Gout    History of colon polyps    History of hiatal hernia    History of kidney stones    Hyperlipidemia    Leukocytosis 01/21/2023   Lumbar disc disease    Myocardial infarction (HCC) 2015   Oxygen  dependent    at bedtime- 2L   Persistent atrial fibrillation (HCC)    8 cardioversions and 2 ablations   Sleep apnea    CPAP   Spinal stenosis     Past Surgical History:  Procedure Laterality Date   ABLATION OF DYSRHYTHMIC FOCUS  07/28/2017   ATRIAL FIBRILLATION ABLATION N/A 07/28/2017   Procedure: ATRIAL FIBRILLATION ABLATION;  Surgeon: Jolly Needle, MD;  Location: [facility] INVASIVE CV LAB;  Service: Cardiovascular;  Laterality: N/A;   ATRIAL FIBRILLATION ABLATION N/A 12/04/2020   Procedure: ATRIAL FIBRILLATION ABLATION;  Surgeon: Jolly Needle, MD;  Location: [facility] INVASIVE CV LAB;  Service: Cardiovascular;  Laterality: N/A;   BACK SURGERY     CARDIAC CATHETERIZATION  05/22/2014    Procedure: IABP INSERTION;  Surgeon: Arleen Lacer, MD;  Location: [facility] CATH LAB;  Service: Cardiovascular;;   CARDIOVERSION N/A 04/29/2017   Procedure: CARDIOVERSION;  Surgeon: Gerard Knight, MD;  Location: [facility] ENDO SUITE;  Service: Cardiovascular;  Laterality: N/A;   CARDIOVERSION N/A 08/13/2017   Procedure: CARDIOVERSION;  Surgeon: Hazle Lites, MD;  Location: [facility] ENDOSCOPY;  Service: Cardiovascular;  Laterality: N/A;   CARDIOVERSION N/A 08/15/2019   Procedure: CARDIOVERSION;  Surgeon: Liza Riggers, MD;  Location: [facility] ENDOSCOPY;  Service: Cardiovascular;  Laterality: N/A;   CARDIOVERSION N/A 03/09/2020   Procedure: CARDIOVERSION;  Surgeon: Sheryle Donning, MD;  Location: [facility] ENDOSCOPY;  Service: Cardiovascular;  Laterality: N/A;   CARDIOVERSION N/A 07/06/2020   Procedure: CARDIOVERSION;  Surgeon: Luana Rumple, MD;  Location: [facility] ENDOSCOPY;  Service: Cardiovascular;  Laterality: N/A;   CARDIOVERSION N/A 01/18/2021   Procedure: CARDIOVERSION;  Surgeon: Maudine Sos, MD;  Location: [facility] ENDOSCOPY;  Service: Cardiovascular;  Laterality: N/A;   CARDIOVERSION N/A 06/17/2021   Procedure: CARDIOVERSION;  Surgeon: Harrold Lincoln, MD;  Location: [facility] ENDOSCOPY;  Service: Cardiovascular;  Laterality: N/A;   Cataract surgery Bilateral    COLONOSCOPY     COLONOSCOPY N/A 12/30/2017   Procedure: COLONOSCOPY;  Surgeon: Ruby Corporal, MD;  Location: [facility] ENDO [facility];  Service: Endoscopy;  Laterality: N/A;   CORONARY  ARTERY BYPASS GRAFT N/A 05/22/2014   Procedure: CORONARY ARTERY BYPASS GRAFTING (CABG) times three using left internal mammary and right saphenous vein.;  Surgeon: Zelphia Higashi, MD;  Location: [facility] OR;  Service: Open Heart Surgery;  Laterality: N/A;   ENDARTERECTOMY Left 02/17/2014   Procedure: ENDARTERECTOMY CAROTID WITH PATCH ANGIOPLASTY;  Surgeon: Palma Bob, MD;  Location: [facility] OR;  Service: Vascular;  Laterality: Left;   ESOPHAGEAL DILATION N/A 08/22/2015    Procedure: ESOPHAGEAL DILATION;  Surgeon: Ruby Corporal, MD;  Location: [facility];  Service: Endoscopy;  Laterality: N/A;   ESOPHAGOGASTRODUODENOSCOPY N/A 08/22/2015   Procedure: ESOPHAGOGASTRODUODENOSCOPY (EGD);  Surgeon: Ruby Corporal, MD;  Location: [facility] ENDO [facility];  Service: Endoscopy;  Laterality: N/A;  12:45 - moved to 1:55 - Ann notified pt   ESOPHAGOGASTRODUODENOSCOPY N/A 12/30/2017   Procedure: ESOPHAGOGASTRODUODENOSCOPY (EGD);  Surgeon: Ruby Corporal, MD;  Location: [facility];  Service: Endoscopy;  Laterality: N/A;  200   EYE SURGERY     cataract extraction (right) with repair macular tear , with IOL     right   FRACTURE SURGERY     bilateral wrist fractures- one ORIF   IR RADIOLOGIST EVAL & MGMT  12/01/2022   IR RADIOLOGIST EVAL & MGMT  06/25/2023   IR RADIOLOGIST EVAL & MGMT  08/26/2023   IR TIPS  07/13/2023   IR US  GUIDE VASC ACCESS RIGHT  07/13/2023   JOINT REPLACEMENT  2011   left knee   LEFT HEART CATHETERIZATION WITH CORONARY ANGIOGRAM N/A 05/22/2014   Procedure: LEFT HEART CATHETERIZATION WITH CORONARY ANGIOGRAM;  Surgeon: Arleen Lacer, MD;  Location: [facility] CATH LAB;  Service: Cardiovascular;  Laterality: N/A;   LITHOTRIPSY     PARS PLANA VITRECTOMY W/ REPAIR OF MACULAR HOLE     RHINOPLASTY     TEE WITHOUT CARDIOVERSION N/A 04/29/2017   Procedure: TRANSESOPHAGEAL ECHOCARDIOGRAM (TEE) WITH PROPOFOL ;  Surgeon: Gerard Knight, MD;  Location: [facility] ENDO [facility]ITE;  Service: Cardiovascular;  Laterality: N/A;   TIPS PROCEDURE N/A 07/13/2023   Procedure: TRANS-JUGULAR INTRAHEPATIC PORTAL SHUNT (TIPS);  Surgeon: Art Largo, MD;  Location: [facility] OR;  Service: Radiology;  Laterality: N/A;   TONSILLECTOMY     TOTAL HIP ARTHROPLASTY  12/08/2011   Procedure: TOTAL HIP ARTHROPLASTY;  Surgeon: Aurther Blue, MD;  Location: [facility] ORS;  Service: Orthopedics;  Laterality: Right;   UPPER GI ENDOSCOPY  12/18/2015   Procedure: UPPER GI ENDOSCOPY;  Surgeon: Shela Derby, MD;   Location: [facility] ORS;  Service: General;;   WRIST SURGERY Right 68yrs ago   WRIST SURGERY Left     Social History   Socioeconomic History   Marital status: Married    Spouse name: Not on file   Number of children: Not on file   Years of education: Not on file   Highest education level: Not on file  Occupational History   Occupation: retired    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  Tobacco Use   Smoking status: Former    Current packs/day: 0.00    Average packs/day: 1.5 packs/day for 25.0 years (37.5 ttl pk-yrs)    Types: Cigarettes    Start date: 08/08/1960    Quit date: 06/02/1982    Years since quitting: 41.3    Passive exposure: Never   Smokeless tobacco: Never   Tobacco comments:    quit smoking 30+yrs ago  Vaping Use   Vaping status: Never Used  Substance and Sexual Activity   Alcohol use: No  Alcohol/week: 0.0 standard drinks of alcohol    Comment: occasionally    Drug use: No   Sexual activity: Yes  Other Topics Concern   Not on file  Social History Narrative   Not on file   Social Drivers of Health   Financial Resource Strain: Not on file  Food Insecurity: No Food Insecurity (08/12/2023)   Hunger Vital Sign    Worried About Running Out of Food in the Last Year: Never true    Ran Out of Food in the Last Year: Never true  Transportation Needs: No Transportation Needs (08/12/2023)   PRAPARE - Administrator, Civil Service (Medical): No    Lack of Transportation (Non-Medical): No  Physical Activity: Not on file  Stress: Not on file  Social Connections: Socially Integrated (08/12/2023)   Social Connection and Isolation Panel [NHANES]    Frequency of Communication with Friends and Family: More than three times a week    Frequency of Social Gatherings with Friends and Family: More than three times a week    Attends Religious Services: More than 4 times per year    Active Member of [employer] or Organizations: Yes    Attends Banker Meetings: Never     Marital Status: Married  Catering manager Violence: Not At Risk (08/12/2023)   Humiliation, Afraid, Rape, and Kick questionnaire    Fear of Current or Ex-Partner: No    Emotionally Abused: No    Physically Abused: No    Sexually Abused: No   Family History  Problem Relation Age of Onset   Allergies Mother    Heart disease Mother    Hypertension Mother    Heart disease Father        MI      VITAL SIGNS BP 110/62   Pulse 73   Temp 97.8 F (36.6 C)   Resp 20   Ht 5\' 9"  (1.753 m)   Wt 196 lb 6.4 oz (89.1 kg)   SpO2 95%   BMI 29.00 kg/m   Outpatient Encounter Medications as of 09/16/2023  Medication Sig Note   acetaminophen  (TYLENOL ) 325 MG tablet Take 650 mg by mouth every 6 (six) hours as needed.    amiodarone  (PACERONE ) 100 MG tablet Take 100 mg by mouth daily.    atorvastatin  (LIPITOR) 80 MG tablet TAKE 1 TABLET BY MOUTH EVERY DAY IN THE EVENING    Cholecalciferol  (VITAMIN D ) 50 MCG (2000 UT) CAPS Take 2,000 Units by mouth daily.    cyanocobalamin  (VITAMIN B12) 1000 MCG tablet Take 1,000 mcg by mouth 2 (two) times a week. Tuesday and Friday    doxazosin  (CARDURA ) 4 MG tablet Take 4 mg by mouth in the morning.    empagliflozin  (JARDIANCE ) 10 MG TABS tablet TAKE 1 TABLET BY MOUTH DAILY BEFORE BREAKFAST.    feeding supplement (BOOST HIGH PROTEIN) LIQD Take 1 Container by mouth daily in the afternoon. 07/15/2023: Uses protein  supplement    finasteride  (PROSCAR ) 5 MG tablet Take 5 mg by mouth every evening.     fluticasone  (FLONASE ) 50 MCG/ACT nasal spray Place 1 spray into both nostrils daily as needed for allergies or rhinitis.    furosemide  (LASIX ) 40 MG tablet Take 1 tablet (40 mg total) by mouth daily in the afternoon.    furosemide  (LASIX ) 80 MG tablet Take 1 tablet (80 mg total) by mouth daily.    lactulose  (CHRONULAC ) 10 GM/15ML solution Take 67.5 mLs (45 g total) by mouth 3 (three) times  daily. Titrate dosage of lactulose  to 2-3 soft bowels per day (Patient taking  differently: Take 40 g by mouth 2 (two) times daily.) 08/24/2023: Three times daily    nitroGLYCERIN  (NITROSTAT ) 0.4 MG SL tablet Place 1 tablet (0.4 mg total) under the tongue every 5 (five) minutes x 3 doses as needed for chest pain.    omeprazole  (PRILOSEC) 40 MG capsule Take 40 mg by mouth daily.    ondansetron  (ZOFRAN -ODT) 4 MG disintegrating tablet Take 4 mg by mouth 4 (four) times daily -  before meals and at bedtime.    Probiotic Product (PROBIOTIC DAILY PO) Take by mouth. S. Boulardii saccharomyces boulardii 250mg  capsule twice a day    Protein (PROSOURCE PO) Take 30 mLs by mouth 4 (four) times daily.    Rivaroxaban  (XARELTO ) 15 MG TABS tablet Take 1 tablet (15 mg total) by mouth daily with supper.    sertraline  (ZOLOFT ) 25 MG tablet Take 1 tablet (25 mg total) by mouth at bedtime.    spironolactone  (ALDACTONE ) 25 MG tablet TAKE 1 TABLET (25 MG TOTAL) BY MOUTH DAILY.    tiZANidine  (ZANAFLEX ) 2 MG tablet Take 2 mg by mouth 2 (two) times daily as needed for muscle spasms.    No facility-administered encounter medications on file as of 09/16/2023.     SIGNIFICANT DIAGNOSTIC EXAMS  TODAY  09-07-23: glucose 86; bun 40; creat 2.25; k+ 3.9; na++ 134; ca 8.1 gfr 29; protein 4.9 albumin  2.1; ast 158 alt 148   Review of Systems  Constitutional:  Positive for malaise/fatigue.  Respiratory:  Negative for cough and shortness of breath.   Cardiovascular:  Negative for chest pain, palpitations and leg swelling.  Gastrointestinal:  Negative for abdominal pain, constipation and heartburn.  Musculoskeletal:  Negative for back pain, joint pain and myalgias.  Skin: Negative.   Neurological:  Negative for dizziness.  Psychiatric/Behavioral:  Positive for depression. The patient is not nervous/anxious.    Physical Exam Constitutional:      General: He is not in acute distress.    Appearance: He is well-developed. He is not diaphoretic.  Neck:     Thyroid : No thyromegaly.  Cardiovascular:      Rate and Rhythm: Normal rate and regular rhythm.     Heart sounds: Normal heart sounds.  Pulmonary:     Effort: Pulmonary effort is normal. No respiratory distress.     Breath sounds: Normal breath sounds.  Abdominal:     General: Bowel sounds are normal. There is no distension.     Palpations: Abdomen is soft.     Tenderness: There is no abdominal tenderness.  Musculoskeletal:        General: Normal range of motion.     Cervical back: Neck supple.     Right lower leg: No edema.     Left lower leg: No edema.  Lymphadenopathy:     Cervical: No cervical adenopathy.  Skin:    General: Skin is warm and dry.  Neurological:     Mental Status: He is alert. Mental status is at baseline.  Psychiatric:        Mood and Affect: Mood normal.    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

## 2023-09-16 NOTE — Telephone Encounter (Signed)
 Pt's wife notified.

## 2023-09-16 NOTE — Telephone Encounter (Signed)
 So sorry to hear this. Unfortunately, I do not know any providers at [place], [place]. Wife should try to get him connected to any GI in that area. Agree with writing the orders. Thanks

## 2023-09-16 NOTE — Telephone Encounter (Signed)
 Wife called today to follow up on message.   [phone]

## 2023-09-17 ENCOUNTER — Encounter (INDEPENDENT_AMBULATORY_CARE_PROVIDER_SITE_OTHER): Payer: Self-pay

## 2023-09-17 DIAGNOSIS — I4891 Unspecified atrial fibrillation: Secondary | ICD-10-CM | POA: Diagnosis not present

## 2023-09-17 DIAGNOSIS — J9621 Acute and chronic respiratory failure with hypoxia: Secondary | ICD-10-CM | POA: Diagnosis not present

## 2023-09-18 LAB — CYTOLOGY - NON PAP

## 2023-09-20 ENCOUNTER — Telehealth: Payer: Self-pay | Admitting: Family

## 2023-09-20 NOTE — Telephone Encounter (Signed)
 [facility] facility Nurse called,reports patient currently on end of life.states patient's wife considering taking patient home on Hospice service.No hospice order.Nurse states patient is in pain and has shallow respiration.recommended order for hospice but wife would like to wait on ordering hospice till discharged home.Morphine  10 mg/ml solution give 5 mg orally every 4 hrs as needed for pain and shortness of breath. Please follow up.

## 2023-09-21 ENCOUNTER — Other Ambulatory Visit: Payer: Self-pay | Admitting: Adult Health

## 2023-09-21 ENCOUNTER — Non-Acute Institutional Stay (SKILLED_NURSING_FACILITY): Payer: Self-pay | Admitting: Adult Health

## 2023-09-21 ENCOUNTER — Encounter: Payer: Self-pay | Admitting: Adult Health

## 2023-09-21 DIAGNOSIS — K7460 Unspecified cirrhosis of liver: Secondary | ICD-10-CM

## 2023-09-21 DIAGNOSIS — I5022 Chronic systolic (congestive) heart failure: Secondary | ICD-10-CM

## 2023-09-21 DIAGNOSIS — I700 Atherosclerosis of aorta: Secondary | ICD-10-CM

## 2023-09-21 DIAGNOSIS — I4891 Unspecified atrial fibrillation: Secondary | ICD-10-CM | POA: Diagnosis not present

## 2023-09-21 DIAGNOSIS — K7290 Hepatic failure, unspecified without coma: Secondary | ICD-10-CM

## 2023-09-21 DIAGNOSIS — F32A Depression, unspecified: Secondary | ICD-10-CM | POA: Insufficient documentation

## 2023-09-21 LAB — CULTURE, BODY FLUID W GRAM STAIN -BOTTLE: Culture: NO GROWTH

## 2023-09-21 MED ORDER — MORPHINE SULFATE (CONCENTRATE) 10 MG /0.5 ML PO SOLN: 5.0000 mg | ORAL | 0 refills | Status: DC | PRN

## 2023-09-21 NOTE — Progress Notes (Signed)
 Location:  [facility] Room Number: 131 Place of Service:  SNF (31)   CODE STATUS: dnr   Allergies  Allergen Reactions   Codeine Sulfate Nausea Only    Chief Complaint  Patient presents with   Discharge Note    HPI:  He is being discharged to home where he will be followed by hospice care. He has continued to decline since his admission to this facility. He now requires almost total assist with his adl care. His family is wanting him to return home to spend his last days there. He will not need dme; hospice will provide; he will need his prescriptions written and will follow up with hospice care.   Past Medical History:  Diagnosis Date   AAA (abdominal aortic aneurysm) (HCC)    Arthritis    Basal cell carcinoma    BOOP (bronchiolitis obliterans with organizing pneumonia) (HCC) 2023   Carotid stenosis    Chronic kidney disease, stage 3b (HCC) 12/13/2021   Cirrhosis of liver not due to alcohol (HCC)    Coronary artery disease    Multvessel s/p CABG 2015   Enlarged prostate    Essential hypertension    GERD (gastroesophageal reflux disease)    Gout    History of colon polyps    History of hiatal hernia    History of kidney stones    Hyperlipidemia    Leukocytosis 01/21/2023   Lumbar disc disease    Myocardial infarction (HCC) 2015   Oxygen  dependent    at bedtime- 2L   Persistent atrial fibrillation (HCC)    8 cardioversions and 2 ablations   Sleep apnea    CPAP   Spinal stenosis     Past Surgical History:  Procedure Laterality Date   ABLATION OF DYSRHYTHMIC FOCUS  07/28/2017   ATRIAL FIBRILLATION ABLATION N/A 07/28/2017   Procedure: ATRIAL FIBRILLATION ABLATION;  Surgeon: Jolly Needle, MD;  Location: [facility] INVASIVE CV LAB;  Service: Cardiovascular;  Laterality: N/A;   ATRIAL FIBRILLATION ABLATION N/A 12/04/2020   Procedure: ATRIAL FIBRILLATION ABLATION;  Surgeon: Jolly Needle, MD;  Location: [facility] INVASIVE CV LAB;  Service: Cardiovascular;   Laterality: N/A;   BACK SURGERY     CARDIAC CATHETERIZATION  05/22/2014   Procedure: IABP INSERTION;  Surgeon: Arleen Lacer, MD;  Location: [facility] CATH LAB;  Service: Cardiovascular;;   CARDIOVERSION N/A 04/29/2017   Procedure: CARDIOVERSION;  Surgeon: Gerard Knight, MD;  Location: [facility] ENDO SUITE;  Service: Cardiovascular;  Laterality: N/A;   CARDIOVERSION N/A 08/13/2017   Procedure: CARDIOVERSION;  Surgeon: Hazle Lites, MD;  Location: [facility] ENDOSCOPY;  Service: Cardiovascular;  Laterality: N/A;   CARDIOVERSION N/A 08/15/2019   Procedure: CARDIOVERSION;  Surgeon: Liza Riggers, MD;  Location: [facility] ENDOSCOPY;  Service: Cardiovascular;  Laterality: N/A;   CARDIOVERSION N/A 03/09/2020   Procedure: CARDIOVERSION;  Surgeon: Sheryle Donning, MD;  Location: [facility] ENDOSCOPY;  Service: Cardiovascular;  Laterality: N/A;   CARDIOVERSION N/A 07/06/2020   Procedure: CARDIOVERSION;  Surgeon: Luana Rumple, MD;  Location: [facility] ENDOSCOPY;  Service: Cardiovascular;  Laterality: N/A;   CARDIOVERSION N/A 01/18/2021   Procedure: CARDIOVERSION;  Surgeon: Maudine Sos, MD;  Location: [facility] ENDOSCOPY;  Service: Cardiovascular;  Laterality: N/A;   CARDIOVERSION N/A 06/17/2021   Procedure: CARDIOVERSION;  Surgeon: Harrold Lincoln, MD;  Location: [facility] ENDOSCOPY;  Service: Cardiovascular;  Laterality: N/A;   Cataract surgery Bilateral    COLONOSCOPY     COLONOSCOPY N/A 12/30/2017   Procedure: COLONOSCOPY;  Surgeon: Homero Luster,  Mathews Solomons, MD;  Location: [facility] ENDO SU[facility];  Service: Endoscopy;  Laterality: N/A;   CORONARY ARTERY BYPASS GRAFT N/A 05/22/2014   Procedure: CORONARY ARTERY BYPASS GRAFTING (CABG) times three using left internal mammary and right saphenous vein.;  Surgeon: Zelphia Higashi, MD;  Location: [facility] OR;  Service: Open Heart Surgery;  Laterality: N/A;   ENDARTERECTOMY Left 02/17/2014   Procedure: ENDARTERECTOMY CAROTID WITH PATCH ANGIOPLASTY;  Surgeon: Palma Bob, MD;  Location: [facility] OR;   Service: Vascular;  Laterality: Left;   ESOPHAGEAL DILATION N/A 08/22/2015   Procedure: ESOPHAGEAL DILATION;  Surgeon: Ruby Corporal, MD;  Location: [facility] ENDO SUITE;  Service: Endoscopy;  Laterality: N/A;   ESOPHAGOGASTRODUODENOSCOPY N/A 08/22/2015   Procedure: ESOPHAGOGASTRODUODENOSCOPY (EGD);  Surgeon: Ruby Corporal, MD;  Location: [facility] ENDO SUITE;  Service: Endoscopy;  Laterality: N/A;  12:45 - moved to 1:55 - Ann notified pt   ESOPHAGOGASTRODUODENOSCOPY N/A 12/30/2017   Procedure: ESOPHAGOGASTRODUODENOSCOPY (EGD);  Surgeon: Ruby Corporal, MD;  Location: [facility] ENDO [facility];  Service: Endoscopy;  Laterality: N/A;  200   EYE SURGERY     cataract extraction (right) with repair macular tear , with IOL     right   FRACTURE SURGERY     bilateral wrist fractures- one ORIF   IR RADIOLOGIST EVAL & MGMT  12/01/2022   IR RADIOLOGIST EVAL & MGMT  06/25/2023   IR RADIOLOGIST EVAL & MGMT  08/26/2023   IR TIPS  07/13/2023   IR US  GUIDE VASC ACCESS RIGHT  07/13/2023   JOINT REPLACEMENT  2011   left knee   LEFT HEART CATHETERIZATION WITH CORONARY ANGIOGRAM N/A 05/22/2014   Procedure: LEFT HEART CATHETERIZATION WITH CORONARY ANGIOGRAM;  Surgeon: Arleen Lacer, MD;  Location: [facility] CATH LAB;  Service: Cardiovascular;  Laterality: N/A;   LITHOTRIPSY     PARS PLANA VITRECTOMY W/ REPAIR OF MACULAR HOLE     RHINOPLASTY     TEE WITHOUT CARDIOVERSION N/A 04/29/2017   Procedure: TRANSESOPHAGEAL ECHOCARDIOGRAM (TEE) WITH PROPOFOL ;  Surgeon: Gerard Knight, MD;  Location: [facility] ENDO SUITE;  Service: Cardiovascular;  Laterality: N/A;   TIPS PROCEDURE N/A 07/13/2023   Procedure: TRANS-JUGULAR INTRAHEPATIC PORTAL SHUNT (TIPS);  Surgeon: Art Largo, MD;  Location: [facility] OR;  Service: Radiology;  Laterality: N/A;   TONSILLECTOMY     TOTAL HIP ARTHROPLASTY  12/08/2011   Procedure: TOTAL HIP ARTHROPLASTY;  Surgeon: Aurther Blue, MD;  Location: [facility] ORS;  Service: Orthopedics;  Laterality: Right;   UPPER GI ENDOSCOPY   12/18/2015   Procedure: UPPER GI ENDOSCOPY;  Surgeon: Shela Derby, MD;  Location: [facility] ORS;  Service: General;;   WRIST SURGERY Right 19yrs ago   WRIST SURGERY Left     Social History   Socioeconomic History   Marital status: Married    Spouse name: Not on file   Number of children: Not on file   Years of education: Not on file   Highest education level: Not on file  Occupational History   Occupation: retired    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  Tobacco Use   Smoking status: Former    Current packs/day: 0.00    Average packs/day: 1.5 packs/day for 25.0 years (37.5 ttl pk-yrs)    Types: Cigarettes    Start date: 08/08/1960    Quit date: 06/02/1982    Years since quitting: 41.3    Passive exposure: Never   Smokeless tobacco: Never   Tobacco comments:    quit smoking 30+yrs ago  Vaping Use  Vaping status: Never Used  Substance and Sexual Activity   Alcohol use: No    Alcohol/week: 0.0 standard drinks of alcohol    Comment: occasionally    Drug use: No   Sexual activity: Yes  Other Topics Concern   Not on file  Social History Narrative   Not on file   Social Drivers of Health   Financial Resource Strain: Not on file  Food Insecurity: No Food Insecurity (08/12/2023)   Hunger Vital Sign    Worried About Running Out of Food in the Last Year: Never true    Ran Out of Food in the Last Year: Never true  Transportation Needs: No Transportation Needs (08/12/2023)   PRAPARE - Administrator, Civil Service (Medical): No    Lack of Transportation (Non-Medical): No  Physical Activity: Not on file  Stress: Not on file  Social Connections: Socially Integrated (08/12/2023)   Social Connection and Isolation Panel [NHANES]    Frequency of Communication with Friends and Family: More than three times a week    Frequency of Social Gatherings with Friends and Family: More than three times a week    Attends Religious Services: More than 4 times per year    Active Member of  [employer] or Organizations: Yes    Attends Banker Meetings: Never    Marital Status: Married  Catering manager Violence: Not At Risk (08/12/2023)   Humiliation, Afraid, Rape, and Kick questionnaire    Fear of Current or Ex-Partner: No    Emotionally Abused: No    Physically Abused: No    Sexually Abused: No   Family History  Problem Relation Age of Onset   Allergies Mother    Heart disease Mother    Hypertension Mother    Heart disease Father        MI      VITAL SIGNS BP (!) 136/58   Pulse (!) 56   Temp 98 F (36.7 C)   Resp 18   Ht 5\' 9"  (1.753 m)   Wt 188 lb 9.6 oz (85.5 kg)   SpO2 97%   BMI 27.85 kg/m   Outpatient Encounter Medications as of 09/21/2023  Medication Sig Note   acetaminophen  (TYLENOL ) 325 MG tablet Take 650 mg by mouth every 6 (six) hours as needed.    amiodarone  (PACERONE ) 100 MG tablet Take 100 mg by mouth daily.    atorvastatin  (LIPITOR) 80 MG tablet TAKE 1 TABLET BY MOUTH EVERY DAY IN THE EVENING    Cholecalciferol  (VITAMIN D ) 50 MCG (2000 UT) CAPS Take 2,000 Units by mouth daily.    cyanocobalamin  (VITAMIN B12) 1000 MCG tablet Take 1,000 mcg by mouth 2 (two) times a week. Tuesday and Friday    doxazosin  (CARDURA ) 4 MG tablet Take 4 mg by mouth in the morning.    empagliflozin  (JARDIANCE ) 10 MG TABS tablet TAKE 1 TABLET BY MOUTH DAILY BEFORE BREAKFAST.    feeding supplement (BOOST HIGH PROTEIN) LIQD Take 1 Container by mouth 2 (two) times daily between meals. 07/15/2023: Uses protein  supplement    finasteride  (PROSCAR ) 5 MG tablet Take 5 mg by mouth every evening.     fluticasone  (FLONASE ) 50 MCG/ACT nasal spray Place 1 spray into both nostrils daily as needed for allergies or rhinitis.    furosemide  (LASIX ) 40 MG tablet Take 40 mg by mouth 2 (two) times daily.    lactulose  (CHRONULAC ) 10 GM/15ML solution Take 67.5 mLs (45 g total) by mouth 3 (three)  times daily. Titrate dosage of lactulose  to 2-3 soft bowels per day (Patient taking  differently: Take 45 g by mouth 3 (three) times daily.) 08/24/2023: Three times daily    nitroGLYCERIN  (NITROSTAT ) 0.4 MG SL tablet Place 1 tablet (0.4 mg total) under the tongue every 5 (five) minutes x 3 doses as needed for chest pain.    omeprazole  (PRILOSEC) 40 MG capsule Take 40 mg by mouth daily.    ondansetron  (ZOFRAN -ODT) 4 MG disintegrating tablet Take 4 mg by mouth 4 (four) times daily -  before meals and at bedtime.    Probiotic Product (PROBIOTIC DAILY PO) Take by mouth. S. Boulardii saccharomyces boulardii 250mg  capsule twice a day    Protein (PROSOURCE PO) Take 30 mLs by mouth 4 (four) times daily.    Rivaroxaban  (XARELTO ) 15 MG TABS tablet Take 1 tablet (15 mg total) by mouth daily with supper.    sertraline  (ZOLOFT ) 50 MG tablet Take 50 mg by mouth daily.    spironolactone  (ALDACTONE ) 25 MG tablet TAKE 1 TABLET (25 MG TOTAL) BY MOUTH DAILY.    tiZANidine  (ZANAFLEX ) 2 MG tablet Take 2 mg by mouth 2 (two) times daily as needed for muscle spasms.    No facility-administered encounter medications on file as of 09/21/2023.     SIGNIFICANT DIAGNOSTIC EXAMS  TODAY  09-07-23: glucose 86; bun 40; creat 2.25; k+ 3.9; na++ 134; ca 8.1 gfr 29; protein 4.9 albumin  2.1; ast 158 alt 148   Review of Systems  Constitutional:  Negative for malaise/fatigue.  Respiratory:  Negative for cough and shortness of breath.   Cardiovascular:  Negative for chest pain, palpitations and leg swelling.  Gastrointestinal:  Negative for abdominal pain, constipation and heartburn.  Musculoskeletal:  Negative for back pain, joint pain and myalgias.  Skin: Negative.   Neurological:  Negative for dizziness.  Psychiatric/Behavioral:  The patient is not nervous/anxious.    Physical Exam Constitutional:      General: He is not in acute distress.    Appearance: He is well-developed. He is not diaphoretic.  Neck:     Thyroid : No thyromegaly.  Cardiovascular:     Rate and Rhythm: Normal rate and regular rhythm.      Pulses: Normal pulses.     Heart sounds: Normal heart sounds.  Pulmonary:     Effort: Pulmonary effort is normal. No respiratory distress.     Breath sounds: Normal breath sounds.  Abdominal:     General: Bowel sounds are normal. There is no distension.     Palpations: Abdomen is soft.     Tenderness: There is no abdominal tenderness.  Musculoskeletal:        General: Normal range of motion.     Cervical back: Neck supple.     Right lower leg: Edema present.     Left lower leg: Edema present.  Lymphadenopathy:     Cervical: No cervical adenopathy.  Skin:    General: Skin is warm and dry.  Neurological:     Mental Status: He is alert. Mental status is at baseline.  Psychiatric:        Mood and Affect: Mood normal.       ASSESSMENT/ PLAN:   Patient is being discharged with the following home health services:  none   Patient is being discharged with the following durable medical equipment:  none   Patient has been advised to f/u with their PCP in 1-2 weeks to for a transitions of care visit.  Social services at  their facility was responsible for arranging this appointment.  Pt was provided with adequate prescriptions of noncontrolled medications to reach the scheduled appointment .  For controlled substances, a limited supply was provided as appropriate for the individual patient.  If the pt normally receives these medications from a pain clinic or has a contract with another physician, these medications should be received from that clinic or physician only).    A 30 day supply of his prescription medications have been sent to [facility].   Britt Candle NP [facility]  call [phone]

## 2023-09-22 ENCOUNTER — Other Ambulatory Visit: Payer: Self-pay | Admitting: Adult Health

## 2023-09-22 ENCOUNTER — Other Ambulatory Visit: Payer: Self-pay | Admitting: Cardiology

## 2023-09-22 DIAGNOSIS — K5904 Chronic idiopathic constipation: Secondary | ICD-10-CM

## 2023-09-22 DIAGNOSIS — R188 Other ascites: Secondary | ICD-10-CM

## 2023-09-22 MED ORDER — FINASTERIDE 5 MG PO TABS: 5.0000 mg | ORAL_TABLET | Freq: Every evening | ORAL | 0 refills | Status: DC

## 2023-09-22 MED ORDER — SPIRONOLACTONE 25 MG PO TABS: 25.0000 mg | ORAL_TABLET | Freq: Every day | ORAL | 3 refills | Status: DC

## 2023-09-22 MED ORDER — LACTULOSE 10 GM/15ML PO SOLN: 45.0000 g | Freq: Three times a day (TID) | ORAL | 0 refills | Status: DC

## 2023-09-22 MED ORDER — OMEPRAZOLE 40 MG PO CPDR: 40.0000 mg | DELAYED_RELEASE_CAPSULE | Freq: Every day | ORAL | 0 refills | Status: DC

## 2023-09-22 MED ORDER — RIVAROXABAN 15 MG PO TABS: 15.0000 mg | ORAL_TABLET | Freq: Every day | ORAL | 0 refills | Status: DC

## 2023-09-22 MED ORDER — SERTRALINE HCL 50 MG PO TABS: 50.0000 mg | ORAL_TABLET | Freq: Every day | ORAL | 0 refills | Status: DC

## 2023-09-22 MED ORDER — DOXAZOSIN MESYLATE 4 MG PO TABS: 4.0000 mg | ORAL_TABLET | Freq: Every morning | ORAL | 0 refills | Status: DC

## 2023-09-22 MED ORDER — MORPHINE SULFATE (CONCENTRATE) 10 MG /0.5 ML PO SOLN: 5.0000 mg | ORAL | 0 refills | Status: DC | PRN

## 2023-09-22 MED ORDER — ATORVASTATIN CALCIUM 80 MG PO TABS: 80.0000 mg | ORAL_TABLET | Freq: Every evening | ORAL | 0 refills | Status: DC

## 2023-09-22 MED ORDER — AMIODARONE HCL 100 MG PO TABS: 100.0000 mg | ORAL_TABLET | Freq: Every day | ORAL | 0 refills | Status: DC

## 2023-09-22 MED ORDER — EMPAGLIFLOZIN 10 MG PO TABS: 10.0000 mg | ORAL_TABLET | Freq: Every day | ORAL | 0 refills | Status: DC

## 2023-09-22 MED ORDER — ONDANSETRON 4 MG PO TBDP: 4.0000 mg | ORAL_TABLET | Freq: Three times a day (TID) | ORAL | 0 refills | Status: DC

## 2023-09-22 MED ORDER — TIZANIDINE HCL 2 MG PO TABS: 2.0000 mg | ORAL_TABLET | Freq: Two times a day (BID) | ORAL | 0 refills | Status: DC | PRN

## 2023-09-22 MED ORDER — PROSOURCE PO PACK
1.0000 | PACK | Freq: Four times a day (QID) | ORAL | 0 refills | Status: DC
Start: 2023-09-22 — End: 2024-03-25

## 2023-09-22 MED ORDER — NITROGLYCERIN 0.4 MG SL SUBL: 0.4000 mg | SUBLINGUAL_TABLET | SUBLINGUAL | 0 refills | Status: DC | PRN

## 2023-09-22 MED ORDER — FUROSEMIDE 40 MG PO TABS: 40.0000 mg | ORAL_TABLET | Freq: Two times a day (BID) | ORAL | 0 refills | Status: DC

## 2023-09-24 MED ORDER — AMIODARONE HCL 100 MG PO TABS: 100.0000 mg | ORAL_TABLET | Freq: Every day | ORAL | 0 refills | Status: DC

## 2023-10-01 DEATH — deceased

## 2023-10-29 ENCOUNTER — Ambulatory Visit (INDEPENDENT_AMBULATORY_CARE_PROVIDER_SITE_OTHER): Admitting: Gastroenterology

## 2023-11-03 ENCOUNTER — Telehealth: Payer: Self-pay

## 2023-11-03 NOTE — Progress Notes (Signed)
    Note created in error, patient is now deceased

## 2023-12-14 ENCOUNTER — Ambulatory Visit (INDEPENDENT_AMBULATORY_CARE_PROVIDER_SITE_OTHER): Payer: Medicare HMO | Admitting: Gastroenterology

## 2023-12-22 ENCOUNTER — Ambulatory Visit (INDEPENDENT_AMBULATORY_CARE_PROVIDER_SITE_OTHER): Payer: Medicare HMO | Admitting: Gastroenterology

## 2024-03-16 ENCOUNTER — Encounter (INDEPENDENT_AMBULATORY_CARE_PROVIDER_SITE_OTHER): Payer: Self-pay | Admitting: Gastroenterology

## 2024-03-23 ENCOUNTER — Encounter: Payer: Self-pay | Admitting: *Deleted

## 2024-03-23 NOTE — Progress Notes (Deleted)
 Subjective:    Patient ID: Caleb Wolf, male    DOB: 1941/05/23, 83 y.o.   MRN: [id number]  HPI   Male former smoker followed for OSA, complicated by HBP, CAD/MI/CABG, carotid stenosis, osteoarthritis hip, GERD NPSG 07/2012  AHI 15/ hr, CPAP to 10 ONOX 03/25/22- on O2 2L w/ CPAP- O2 sat well maintained until end ofg night when he took it off. 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 CXR/ R ribs 01/24/23-  IMPRESSION: Mildly displaced right eighth rib fracture without complicating factors.  03/23/24- 83 year old male former smoker followed for OSA, Pleural Effusion,  Chronic Respiratory Failure with Hypoxia, complicated by Cirrhosis/ Ascites/ frequent paracentesis, HTN, CAD/MI/CABG/AFib/ Xarelto / CHF EF 45%, , carotid stenosis, osteoarthritis hip, GERD, CKD,  -amiodarone , xarelto ,  O2 2L sleep/ [employer] CPAP auto 5-20/Adapt- Download compliance- Body weight today-    ROS-see HPI + = positive Constitutional:    weight loss, night sweats, fevers, chills, f+atigue, lassitude. HEENT:    headaches, difficulty swallowing, tooth/dental problems, sore throat,       sneezing, itching, ear ache, nasal congestion, post nasal drip, snoring CV:    chest pain, orthopnea, PND, swelling in lower extremities, anasarca,                                                dizziness, palpitations Resp:   +shortness of breath with exertion or at rest.                 productive cough,   non-productive cough, coughing up of blood.              change in color of mucus.  wheezing.   Skin:    rash or lesions. GI:  No-   heartburn, indigestion, abdominal pain, nausea, vomiting, diarrhea,                 change in bowel habits, loss of appetite GU: dysuria, change in color of urine, no urgency or +frequency.   flank pain. MS:   joint pain, stiffness, decreased range of motion, back pain. Neuro-     nothing unusual Psych:  change in mood or affect.  depression or anxiety.   memory loss.    Objective:  OBJ- Physical Exam                     +POC on 2L  O2 sat 98% General- Alert, Oriented, Affect-appropriate, Distress- none acute,  Skin- rash-none, lesions- none, excoriation- none Lymphadenopathy- none Head- atraumatic            Eyes- Gross vision intact, PERRLA, conjunctivae and secretions clear            Ears- Hearing, canals-normal            Nose- Clear, no-Septal dev, mucus, polyps, erosion, perforation             Throat- Mallampati III-IV, mucosa clear , drainage-  none, tonsils- atrophic Neck- flexible , trachea midline, no stridor , thyroid  nl, carotid no bruit Chest - symmetrical excursion , unlabored           Heart/CV- RR+today , no murmur , no gallop  , no rub, nl s1 s2                           - JVD- none , edema- none, stasis changes- none, varices- none           Lung- clear to P&A, wheeze- none, cough- none , +diminished in bases, rub- none           Chest wall-  Abd-  Br/ Gen/ Rectal- Not done, not indicated Extrem- +elastic hose L ankle Neuro- grossly intact to observation

## 2024-03-24 ENCOUNTER — Ambulatory Visit: Payer: Medicare HMO | Admitting: Internal Medicine

## 2024-03-24 DIAGNOSIS — G4733 Obstructive sleep apnea (adult) (pediatric): Secondary | ICD-10-CM
